# Patient Record
Sex: Male | Born: 1960 | Race: Black or African American | Hispanic: No | Marital: Single | Smoking: Current every day smoker
Health system: Southern US, Community
[De-identification: ages and names within clinical notes are randomized; demographics above are authoritative.]

---

## 2005-10-07 ENCOUNTER — Emergency Department (HOSPITAL_COMMUNITY): Admission: EM | Admit: 2005-10-07 | Discharge: 2005-10-07 | Payer: Self-pay | Admitting: Emergency Medicine

## 2005-11-22 ENCOUNTER — Emergency Department (HOSPITAL_COMMUNITY): Admission: EM | Admit: 2005-11-22 | Discharge: 2005-11-22 | Payer: Self-pay | Admitting: Emergency Medicine

## 2006-10-06 ENCOUNTER — Emergency Department (HOSPITAL_COMMUNITY): Admission: EM | Admit: 2006-10-06 | Discharge: 2006-10-06 | Payer: Self-pay | Admitting: Emergency Medicine

## 2007-01-21 ENCOUNTER — Emergency Department (HOSPITAL_COMMUNITY): Admission: EM | Admit: 2007-01-21 | Discharge: 2007-01-21 | Payer: Self-pay | Admitting: Emergency Medicine

## 2021-06-19 ENCOUNTER — Emergency Department (HOSPITAL_COMMUNITY)
Admission: EM | Admit: 2021-06-19 | Discharge: 2021-06-19 | Disposition: A | Discharge status: Home / Self Care | Payer: Managed Care, Other (non HMO) | Attending: Emergency Medicine | Admitting: Emergency Medicine

## 2021-06-19 ENCOUNTER — Emergency Department (HOSPITAL_COMMUNITY): Payer: Managed Care, Other (non HMO)

## 2021-06-19 ENCOUNTER — Encounter (HOSPITAL_COMMUNITY): Payer: Self-pay

## 2021-06-19 ENCOUNTER — Other Ambulatory Visit: Payer: Self-pay

## 2021-06-19 DIAGNOSIS — R251 Tremor, unspecified: Secondary | ICD-10-CM | POA: Diagnosis present

## 2021-06-19 DIAGNOSIS — R531 Weakness: Secondary | ICD-10-CM | POA: Insufficient documentation

## 2021-06-19 DIAGNOSIS — Z79899 Other long term (current) drug therapy: Secondary | ICD-10-CM | POA: Insufficient documentation

## 2021-06-19 DIAGNOSIS — M4802 Spinal stenosis, cervical region: Secondary | ICD-10-CM

## 2021-06-19 LAB — CBC WITH DIFFERENTIAL/PLATELET
Abs Immature Granulocytes: 0.01 10*3/uL (ref 0.00–0.07)
Basophils Absolute: 0 10*3/uL (ref 0.0–0.1)
Basophils Relative: 1 %
Eosinophils Absolute: 0.1 10*3/uL (ref 0.0–0.5)
Eosinophils Relative: 2 %
HCT: 34 % — ABNORMAL LOW (ref 39.0–52.0)
Hemoglobin: 11.8 g/dL — ABNORMAL LOW (ref 13.0–17.0)
Immature Granulocytes: 0 %
Lymphocytes Relative: 33 %
Lymphs Abs: 1.5 10*3/uL (ref 0.7–4.0)
MCH: 34.3 pg — ABNORMAL HIGH (ref 26.0–34.0)
MCHC: 34.7 g/dL (ref 30.0–36.0)
MCV: 98.8 fL (ref 80.0–100.0)
Monocytes Absolute: 0.6 10*3/uL (ref 0.1–1.0)
Monocytes Relative: 14 %
Neutro Abs: 2.3 10*3/uL (ref 1.7–7.7)
Neutrophils Relative %: 50 %
Platelets: 285 10*3/uL (ref 150–400)
RBC: 3.44 MIL/uL — ABNORMAL LOW (ref 4.22–5.81)
RDW: 13.7 % (ref 11.5–15.5)
WBC: 4.5 10*3/uL (ref 4.0–10.5)
nRBC: 0 % (ref 0.0–0.2)

## 2021-06-19 LAB — RAPID URINE DRUG SCREEN, HOSP PERFORMED
Amphetamines: NOT DETECTED
Barbiturates: NOT DETECTED
Benzodiazepines: NOT DETECTED
Cocaine: NOT DETECTED
Opiates: NOT DETECTED
Tetrahydrocannabinol: POSITIVE — AB

## 2021-06-19 LAB — COMPREHENSIVE METABOLIC PANEL
ALT: 51 U/L — ABNORMAL HIGH (ref 0–44)
AST: 87 U/L — ABNORMAL HIGH (ref 15–41)
Albumin: 4 g/dL (ref 3.5–5.0)
Alkaline Phosphatase: 82 U/L (ref 38–126)
Anion gap: 13 (ref 5–15)
BUN: 10 mg/dL (ref 6–20)
CO2: 21 mmol/L — ABNORMAL LOW (ref 22–32)
Calcium: 9.5 mg/dL (ref 8.9–10.3)
Chloride: 101 mmol/L (ref 98–111)
Creatinine, Ser: 0.71 mg/dL (ref 0.61–1.24)
GFR, Estimated: >60 mL/min (ref >60)
Glucose, Bld: 89 mg/dL (ref 70–99)
Potassium: 3.8 mmol/L (ref 3.5–5.1)
Sodium: 135 mmol/L (ref 135–145)
Total Bilirubin: 0.5 mg/dL (ref 0.3–1.2)
Total Protein: 9.3 g/dL — ABNORMAL HIGH (ref 6.5–8.1)

## 2021-06-19 LAB — ETHANOL: Alcohol, Ethyl (B): 178 mg/dL — ABNORMAL HIGH (ref <10)

## 2021-06-19 IMAGING — MR MR HEAD W/O CM
16 series · 48 of 48 positions shown · non-contrast
Comparison: Same-day noncontrast CT head

CLINICAL DATA: Bilateral upper and lower extremity weakness and
tremors, ataxia

EXAM:
MRI HEAD WITHOUT CONTRAST
TECHNIQUE: Multiplanar, multiecho pulse sequences of the brain and surrounding
structures were obtained without intravenous contrast.

[Series 9: DWI · axial · 3.0mm · 1.36mm/px · z∈[-35,+117]mm · 6 of 104 slices shown (1 of 2)]
[im 1/104]
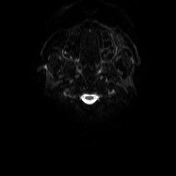
[im 21/104]
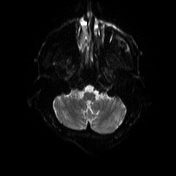
[im 42/104]
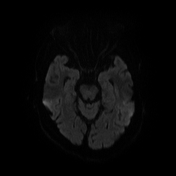
[im 62/104]
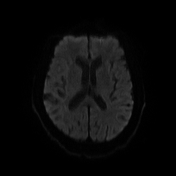
[im 83/104]
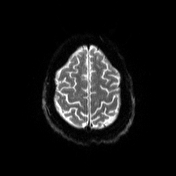
[im 104/104]
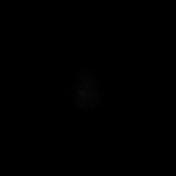

[Series 10: DWI · axial · 3.0mm · 1.36mm/px · z∈[-35,+117]mm · 3 of 52 slices shown (2 of 2)]
[im 1/52]
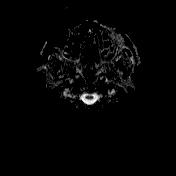
[im 26/52]
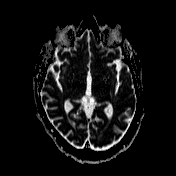
[im 52/52]
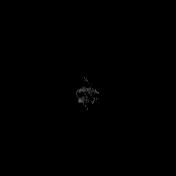

[Series 11: T1 · sagittal · 5.0mm · 0.75mm/px · 1 of 24 slices shown (1 of 4)]
[im 1/24]
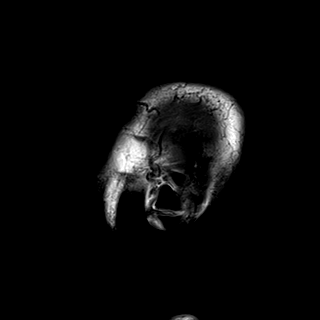

[Series 12: T2 · axial · 5.0mm · 0.62mm/px · z∈[-39,+122]mm · 2 of 25 slices shown (1 of 4)]
[im 1/25]
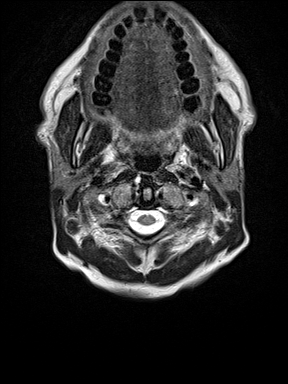
[im 25/25]
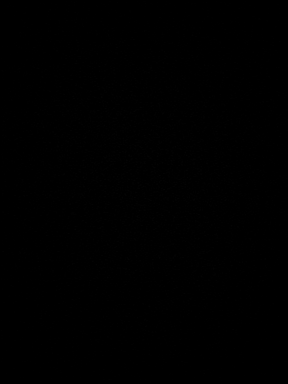

[Series 13: swi_images · axial · 3.0mm · 0.75mm/px · z∈[-41,+123]mm · 4 of 56 slices shown]
[im 1/56]
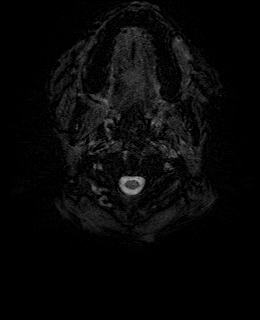
[im 19/56]
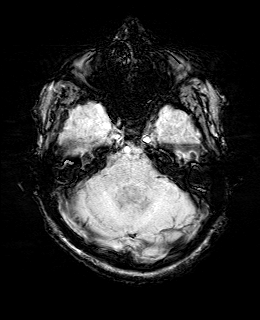
[im 37/56]
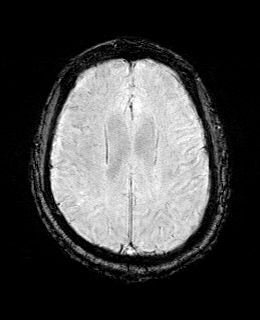
[im 56/56]
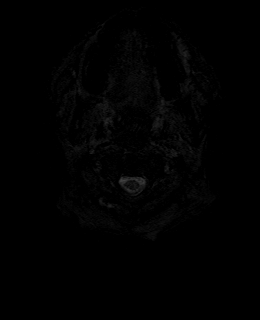

[Series 15: FLAIR · axial · 3.0mm · 0.75mm/px · z∈[-35,+117]mm · 4 of 52 slices shown]
[im 1/52]
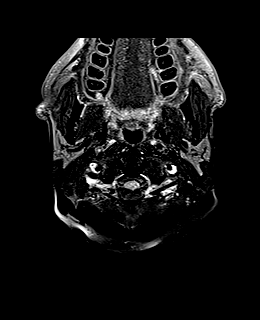
[im 18/52]
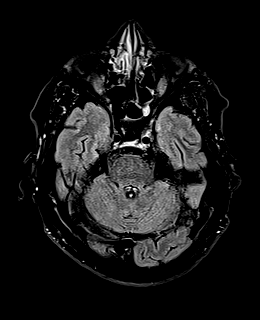
[im 35/52]
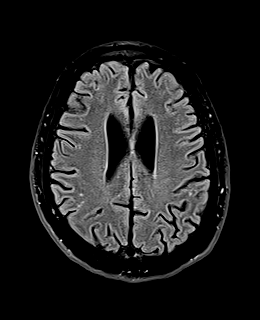
[im 52/52]
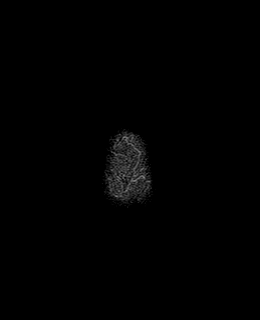

[Series 16: T1 · axial · 1.0mm · 0.94mm/px · z∈[-37,+121]mm · 11 of 160 slices shown (2 of 4)]
[im 1/160]
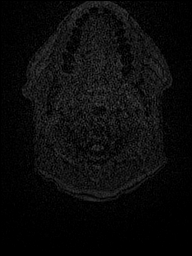
[im 16/160]
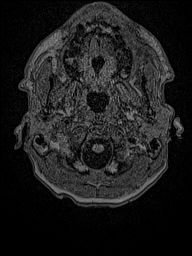
[im 32/160]
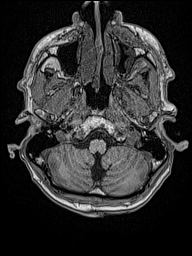
[im 48/160]
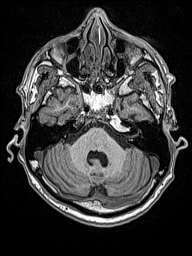
[im 64/160]
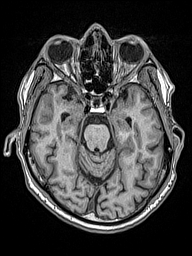
[im 80/160]
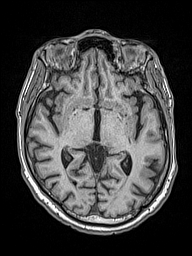
[im 96/160]
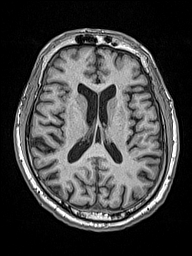
[im 112/160]
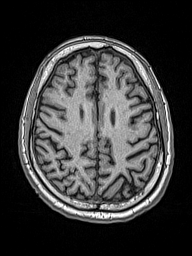
[im 128/160]
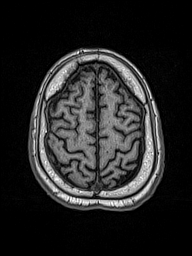
[im 144/160]
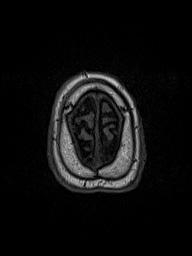
[im 160/160]
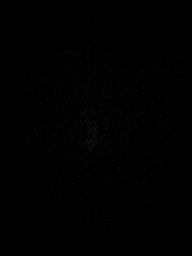

[Series 17: cor dwi_tracew · coronal · 5.0mm · 1.53mm/px · 4 of 56 slices shown]
[im 1/56]
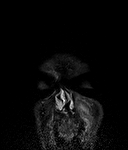
[im 19/56]
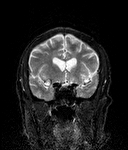
[im 37/56]
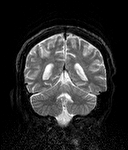
[im 56/56]
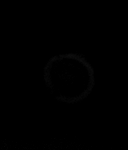

[Series 18: cor dwi_adc · coronal · 5.0mm · 1.53mm/px · 2 of 27 slices shown]
[im 1/27]
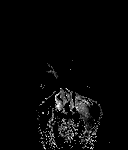
[im 27/27]
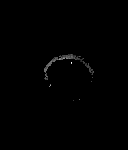

[Series 19: T2 · coronal · 5.0mm · 0.57mm/px · 2 of 32 slices shown (2 of 4)]
[im 1/32]
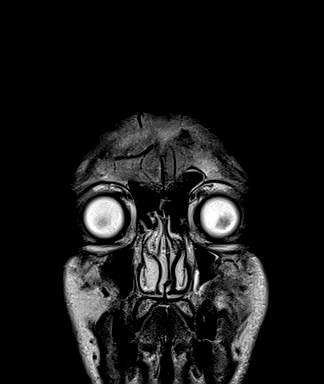
[im 32/32]
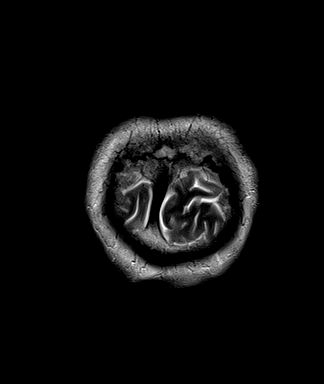

[Series 24: T1 · sagittal · 3.0mm · 0.69mm/px · 1 of 15 slices shown (3 of 4)]
[im 1/15]
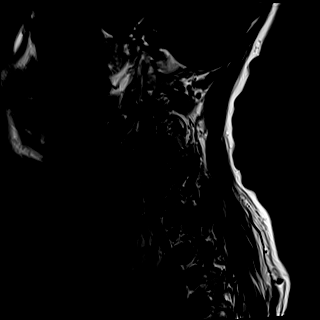

[Series 25: T2 · sagittal · 3.0mm · 0.69mm/px · 1 of 15 slices shown (3 of 4)]
[im 1/15]
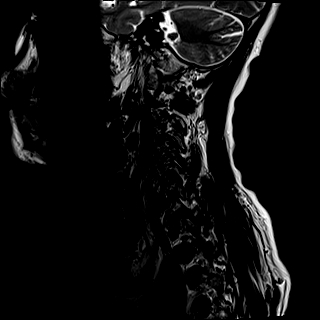

[Series 26: STIR · sagittal · 3.0mm · 0.86mm/px · 1 of 15 slices shown]
[im 1/15]
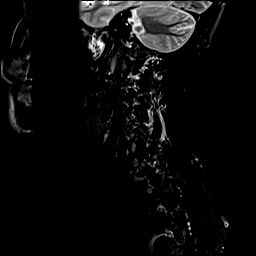

[Series 27: T2 · axial · 3.0mm · 0.70mm/px · z∈[-142,-52]mm · 2 of 27 slices shown (4 of 4)]
[im 1/27]
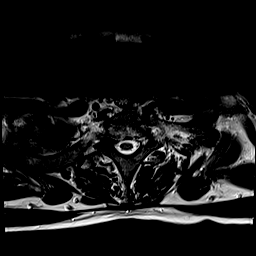
[im 27/27]
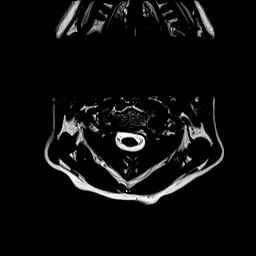

[Series 28: GRE · axial · 3.0mm · 0.35mm/px · z∈[-142,-52]mm · 2 of 27 slices shown]
[im 1/27]
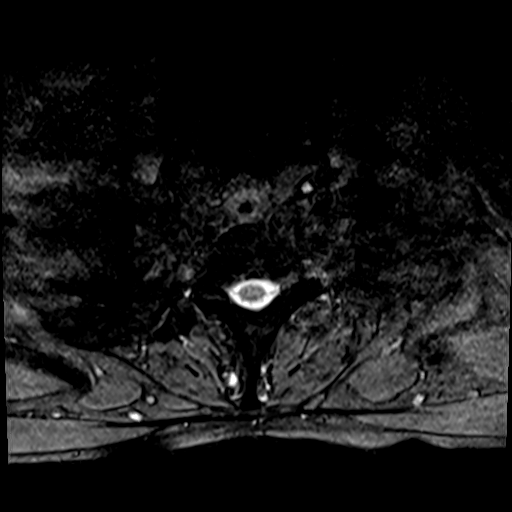
[im 27/27]
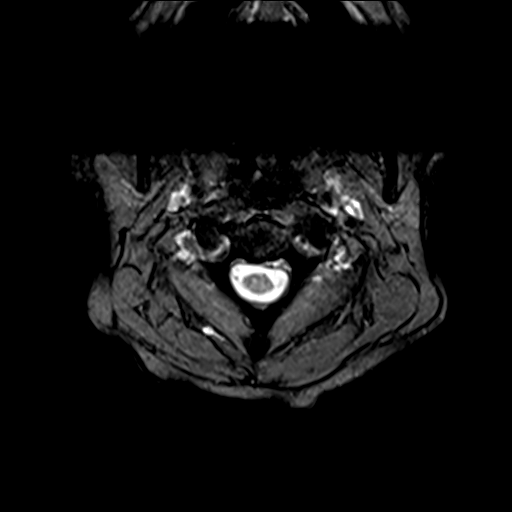

[Series 29: T1 · axial · 3.0mm · 0.35mm/px · z∈[-142,-52]mm · 2 of 26 slices shown (4 of 4)]
[im 1/26]
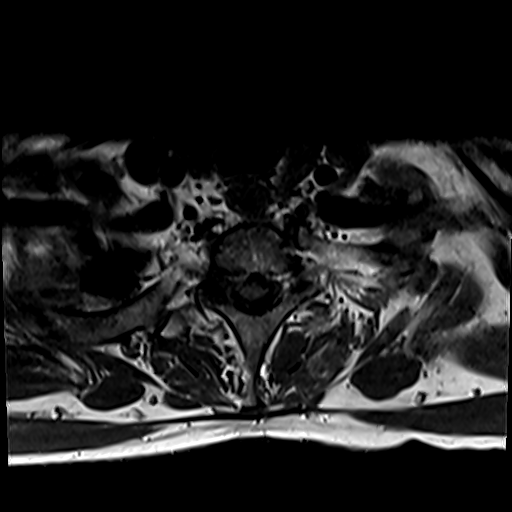
[im 26/26]
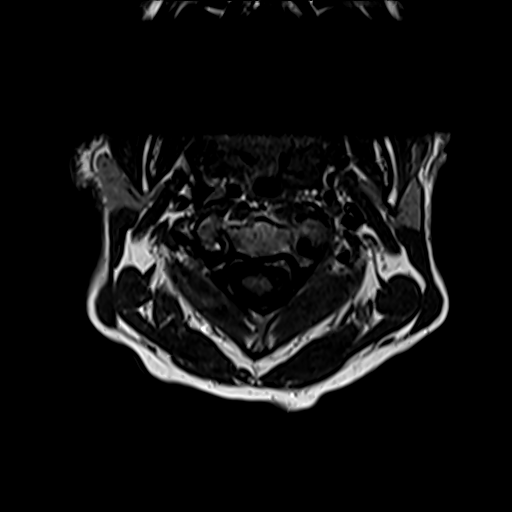

[48 of 48 positions shown; findings below may reference images not displayed]

FINDINGS: Brain: There is no evidence of acute intracranial hemorrhage,
extra-axial fluid collection, or acute infarct.

Parenchymal volume is within normal limits. The ventricles are
normal in size. Scattered small foci of FLAIR signal abnormality in
the subcortical and periventricular white matter are nonspecific but
most likely reflect minimal chronic white matter microangiopathy.

There is no suspicious parenchymal signal abnormality. There is no
mass lesion. There is no midline shift.

Vascular: Normal flow voids.

Skull and upper cervical spine: Normal marrow signal. The cervical
spine is evaluated on the separately dictated cervical spine MRI.

Sinuses/Orbits: There is mild mucosal thickening in the paranasal
sinuses. The globes and orbits are unremarkable.

Other: None.
IMPRESSION: No acute intracranial pathology or finding to explain the patient's
symptoms.

## 2021-06-19 IMAGING — MR MR CERVICAL SPINE W/O CM
16 series · 48 of 48 positions shown · non-contrast
Comparison: None.

CLINICAL DATA: Ataxia, bilateral upper and lower extremity weakness
and tremors

EXAM:
MRI CERVICAL SPINE WITHOUT CONTRAST
TECHNIQUE: Multiplanar, multisequence MR imaging of the cervical spine was
performed. No intravenous contrast was administered.

[Series 10: DWI · axial · 3.0mm · 1.36mm/px · z∈[-35,+117]mm · 6 of 104 slices shown (1 of 2)]
[im 1/104]
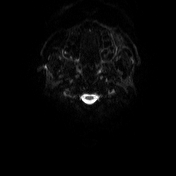
[im 21/104]
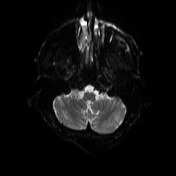
[im 42/104]
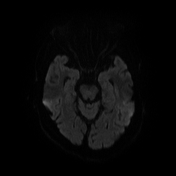
[im 62/104]
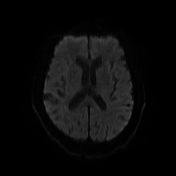
[im 83/104]
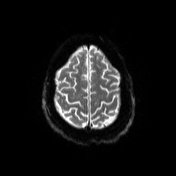
[im 104/104]
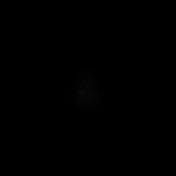

[Series 11: DWI · axial · 3.0mm · 1.36mm/px · z∈[-35,+117]mm · 3 of 52 slices shown (2 of 2)]
[im 1/52]
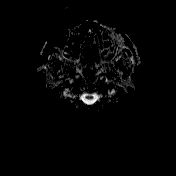
[im 26/52]
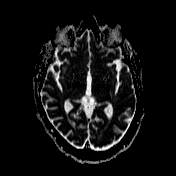
[im 52/52]
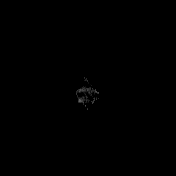

[Series 12: T1 · sagittal · 5.0mm · 0.75mm/px · 1 of 24 slices shown (1 of 4)]
[im 1/24]
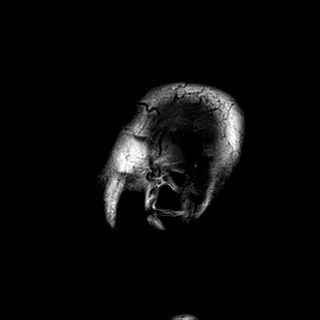

[Series 13: T2 · axial · 5.0mm · 0.62mm/px · z∈[-39,+122]mm · 2 of 25 slices shown (1 of 4)]
[im 1/25]
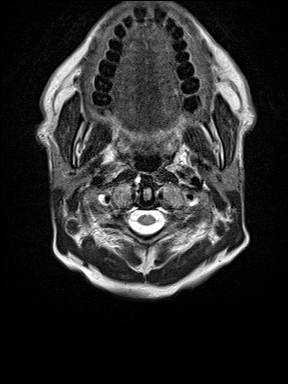
[im 25/25]
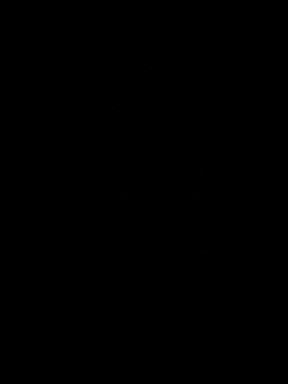

[Series 14: swi_images · axial · 3.0mm · 0.75mm/px · z∈[-41,+123]mm · 4 of 56 slices shown]
[im 1/56]
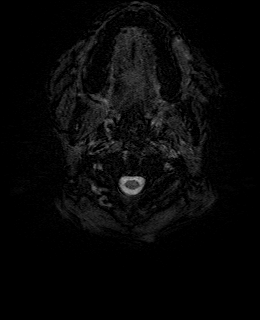
[im 19/56]
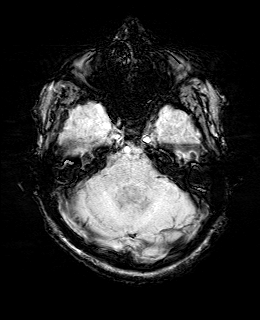
[im 37/56]
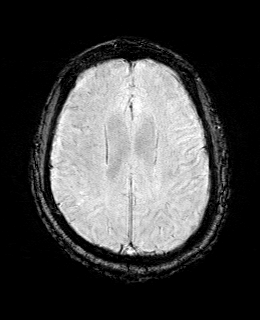
[im 56/56]
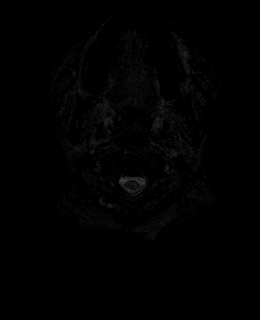

[Series 16: FLAIR · axial · 3.0mm · 0.75mm/px · z∈[-35,+117]mm · 4 of 52 slices shown]
[im 1/52]
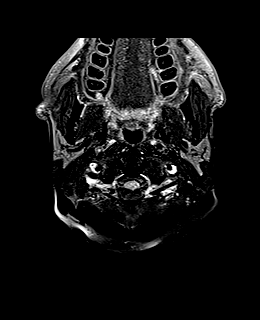
[im 18/52]
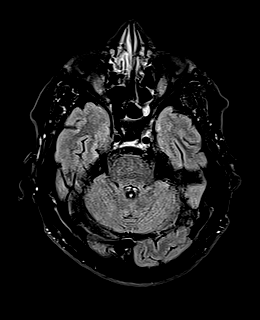
[im 35/52]
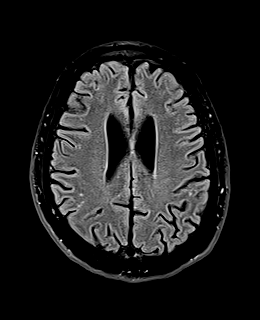
[im 52/52]
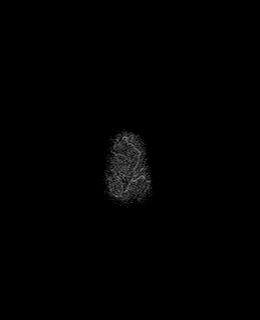

[Series 17: T1 · axial · 1.0mm · 0.94mm/px · z∈[-37,+121]mm · 11 of 160 slices shown (2 of 4)]
[im 1/160]
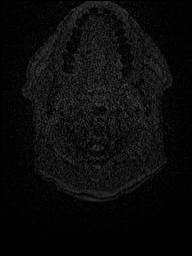
[im 16/160]
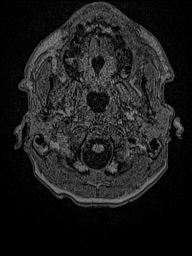
[im 32/160]
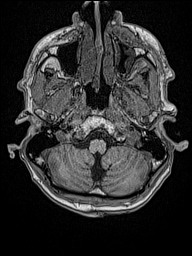
[im 48/160]
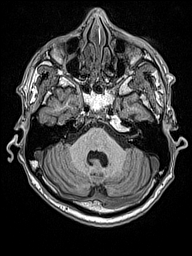
[im 64/160]
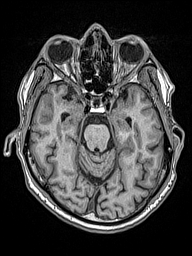
[im 80/160]
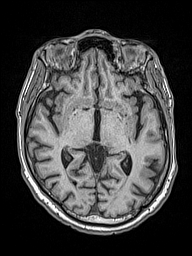
[im 96/160]
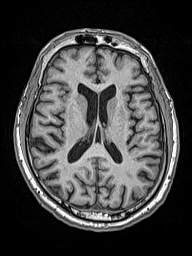
[im 112/160]
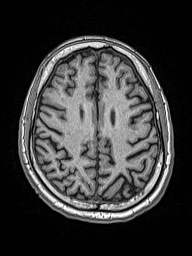
[im 128/160]
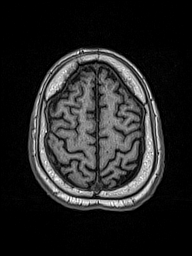
[im 144/160]
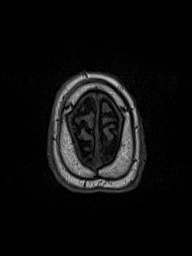
[im 160/160]
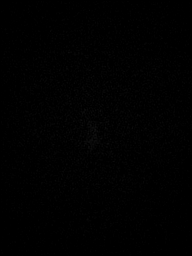

[Series 18: cor dwi_tracew · coronal · 5.0mm · 1.53mm/px · 4 of 56 slices shown]
[im 1/56]
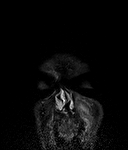
[im 19/56]
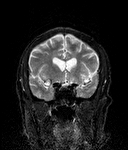
[im 37/56]
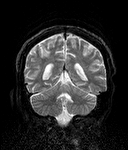
[im 56/56]
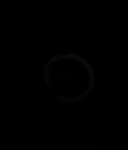

[Series 19: cor dwi_adc · coronal · 5.0mm · 1.53mm/px · 2 of 27 slices shown]
[im 1/27]
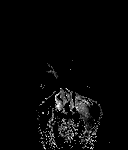
[im 27/27]
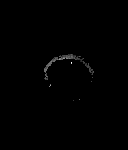

[Series 20: T2 · coronal · 5.0mm · 0.57mm/px · 2 of 32 slices shown (2 of 4)]
[im 1/32]
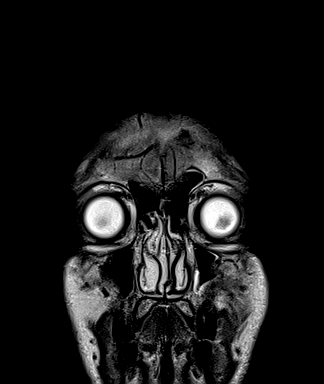
[im 32/32]
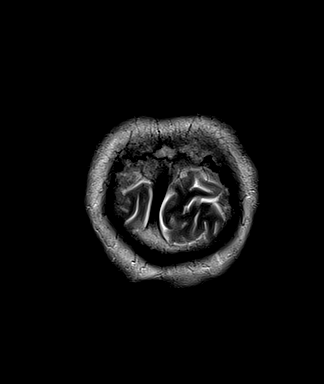

[Series 25: T1 · sagittal · 3.0mm · 0.69mm/px · 1 of 15 slices shown (3 of 4)]
[im 1/15]
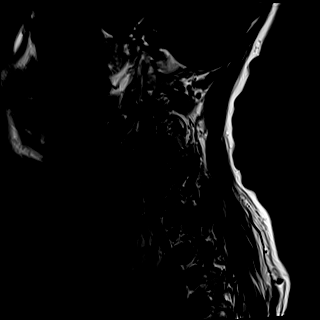

[Series 26: T2 · sagittal · 3.0mm · 0.69mm/px · 1 of 15 slices shown (3 of 4)]
[im 1/15]
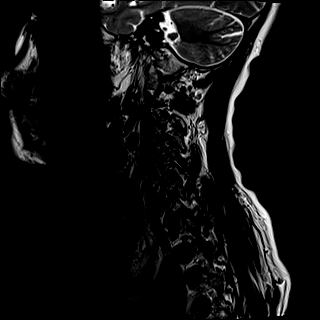

[Series 27: STIR · sagittal · 3.0mm · 0.86mm/px · 1 of 15 slices shown]
[im 1/15]
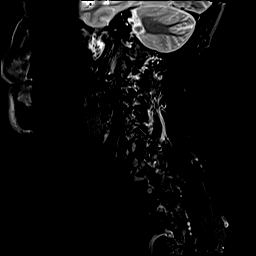

[Series 28: T2 · axial · 3.0mm · 0.70mm/px · z∈[-142,-52]mm · 2 of 27 slices shown (4 of 4)]
[im 1/27]
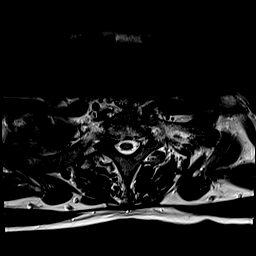
[im 27/27]
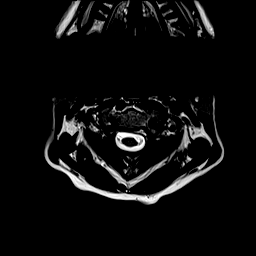

[Series 29: GRE · axial · 3.0mm · 0.35mm/px · z∈[-142,-52]mm · 2 of 27 slices shown]
[im 1/27]
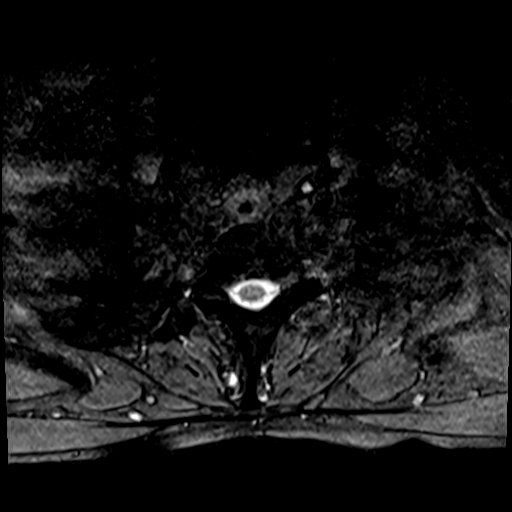
[im 27/27]
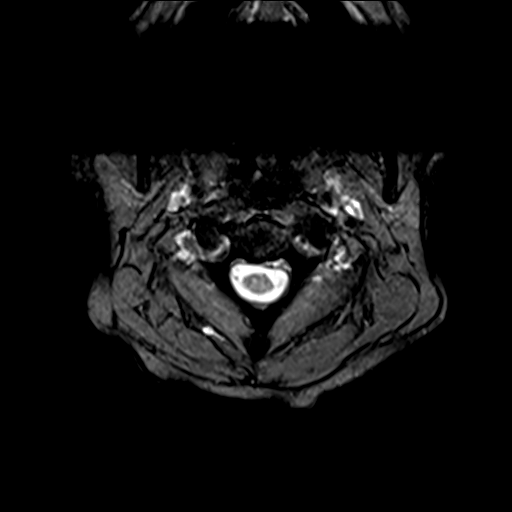

[Series 30: T1 · axial · 3.0mm · 0.35mm/px · z∈[-142,-52]mm · 2 of 26 slices shown (4 of 4)]
[im 1/26]
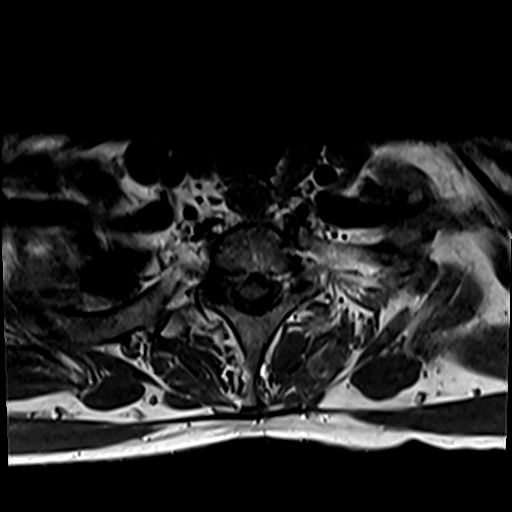
[im 26/26]
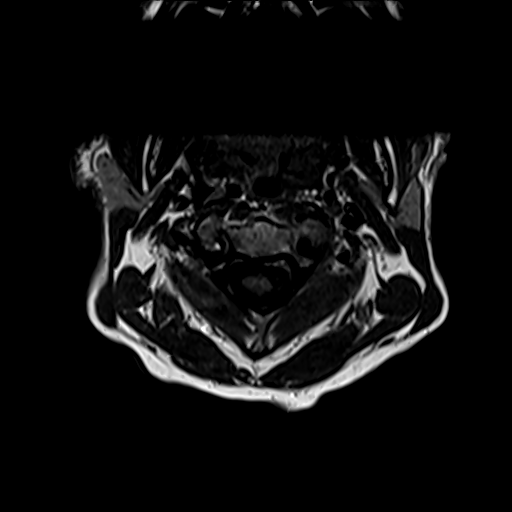

[48 of 48 positions shown; findings below may reference images not displayed]

FINDINGS: Alignment: There is grade 1 anterolisthesis of C2 on C3 and C7 on
T1, likely degenerative in nature. Alignment is otherwise normal.

Vertebrae: Vertebral body heights are preserved. Marrow signal is
heterogeneous throughout, likely degenerative in nature. There is
degenerative endplate marrow signal abnormality at T1-T2. There is
advanced facet arthropathy on the left at C2-C3 and on the right at
C3-C4. There is surrounding perifacetal inflammation.

Cord: There is longitudinal signal abnormality in the dorsal cord
extending from C3 through C5-C6. The cord is otherwise normal.

Posterior Fossa, vertebral arteries, paraspinal tissues: The
posterior fossa is assessed on the separately dictated brain MRI.
The vertebral artery flow voids are present. The paraspinal soft
tissues are unremarkable, aside from the perifacetal edema described
above.

Disc levels:

There is marked multilevel disc desiccation and narrowing throughout
the cervical spine.

C2-C3: There is a broad-based left paracentral/foraminal disc
protrusion and uncovertebral and bilateral facet arthropathy
resulting in severe left and moderate right neural foraminal
stenosis without significant spinal canal stenosis.

C3-C4: There is a broad-based posterior disc osteophyte complex,
posterior longitudinal ligament thickening, and uncovertebral and
bilateral facet arthropathy resulting in severe spinal canal
stenosis with cord compression and severe bilateral neural foraminal
stenosis.

C4-C5: There is a prominent posterior disc osteophyte complex,
posterior longitudinal ligament and ligamentum flavum thickening,
and uncovertebral and bilateral facet arthropathy resulting in
severe spinal canal stenosis with cord compression and severe
bilateral neural foraminal stenosis.

C5-C6: There is a posterior disc osteophyte complex, posterior
longitudinal ligament and ligamentum flavum thickening, and
uncovertebral and bilateral facet arthropathy resulting in moderate
to severe spinal canal stenosis with cord compression and severe
left worse than right neural foraminal stenosis.

C6-C7: There is a broad-based posterior disc osteophyte complex,
posterior longitudinal ligament and ligamentum flavum thickening,
and uncovertebral and facet arthropathy resulting in moderate spinal
canal stenosis with mild mass effect on the cord and severe
bilateral neural foraminal stenosis.

C7-T1: There is grade 1 anterolisthesis with associated degenerative
endplate change and bilateral facet arthropathy resulting in severe
right and mild-to-moderate left neural foraminal stenosis without
significant spinal canal stenosis.

T1-T2: There is a diffuse disc bulge, degenerative endplate change,
and bilateral facet arthropathy resulting in severe right and
moderate left neural foraminal stenosis without significant spinal
canal stenosis.
IMPRESSION: 1. Advanced degenerative change throughout the cervical spine
detailed above resulting in severe spinal canal stenosis at C3-C4
and C4-C5 and moderate to severe spinal canal stenosis at C5-C6 with
cord compression. There is dorsal cord signal abnormality extending
from C3 through C5-C6 most likely reflecting compressive edema
and/or myelomalacia.
2. Moderate spinal canal stenosis with mild mass effect on the cord
at C6-C7.
3. Extensive severe bilateral neural foraminal stenosis throughout
the cervical spine.
4. Facet arthropathy with perifacetal edema on the left at C2-C3 and
on the right at C3-C4. While this finding is most likely
degenerative in nature, infection with septic arthritis can not be
entirely excluded by imaging. Correlate with symptoms and lab
values.

## 2021-06-19 MED ORDER — METHYLPREDNISOLONE 4 MG PO TBPK: ORAL_TABLET | ORAL | 0 refills | Status: DC

## 2021-06-19 MED ORDER — DEXAMETHASONE SODIUM PHOSPHATE 10 MG/ML IJ SOLN
10.0000 mg | Freq: Once | INTRAMUSCULAR | Status: AC
  Administered 2021-06-19: 17:00:00 10 mg via INTRAVENOUS
  Filled 2021-06-19: qty 1

## 2021-06-19 NOTE — ED Triage Notes (Addendum)
Per EMS- Patient was picked up at work. Patient reports that he has been having  tremors daily x 1 week. EMS saw patient have tremors of his extremities that lasted approx 10 minutes. Patient reports that afterwards when these episodes occurred he has pain in his arms, legs, and neck and reports that he is unable to zip a zipper or tie his shoes.   Patient added in triage, that he can not button, pick a fork up, zip his shoes, feels like his legs are going to go out even if he has not had an episode of tremors,

## 2021-06-19 NOTE — ED Provider Notes (Signed)
[facility] DEPT Provider Note   CSN: [id number] Arrival date & time: 06/19/21  1019     History  Chief Complaint  Patient presents with   Tremors    Brian [name] is a 61 y.o. male.  Patient has a history of alcohol use.  Initially said 2 beers a day now he says 4 beers a day.  He states for the last 2 weeks he is been having a lot of shaking.  In the last 24 hours he can even tie his shoes from the shaking in his hands and he is fallen and he is unsteady walking  The history is provided by the patient and medical records.  Weakness Severity:  Moderate Onset quality:  Sudden Timing:  Constant Progression:  Worsening Chronicity:  New Context: alcohol use   Relieved by:  Nothing Worsened by:  Nothing Ineffective treatments:  None tried Associated symptoms: no abdominal pain, no chest pain, no cough, no diarrhea, no frequency, no headaches and no seizures       Home Medications Prior to Admission medications   Not on File      Allergies    Patient has no known allergies.    Review of Systems   Review of Systems  Constitutional:  Negative for appetite change and fatigue.  HENT:  Negative for congestion, ear discharge and sinus pressure.   Eyes:  Negative for discharge.  Respiratory:  Negative for cough.   Cardiovascular:  Negative for chest pain.  Gastrointestinal:  Negative for abdominal pain and diarrhea.  Genitourinary:  Negative for frequency and hematuria.  Musculoskeletal:  Negative for back pain.  Skin:  Negative for rash.  Neurological:  Positive for weakness. Negative for seizures and headaches.  Psychiatric/Behavioral:  Negative for hallucinations.    Physical Exam Updated Vital Signs BP 111/84    Pulse 84    Temp 98.2 F (36.8 C) (Oral)    Resp (!) 33    Ht 5\' 10"  (1.778 m)    Wt 59.9 kg    SpO2 97%    BMI 18.94 kg/m  Physical Exam Vitals and nursing note reviewed.  Constitutional:      Appearance: He is  well-developed.  HENT:     Head: Normocephalic.     Nose: Nose normal.  Eyes:     General: No scleral icterus.    Conjunctiva/sclera: Conjunctivae normal.  Neck:     Thyroid: No thyromegaly.  Cardiovascular:     Rate and Rhythm: Normal rate and regular rhythm.     Heart sounds: No murmur heard.   No friction rub. No gallop.  Pulmonary:     Breath sounds: No stridor. No wheezing or rales.  Chest:     Chest wall: No tenderness.  Abdominal:     General: There is no distension.     Tenderness: There is no abdominal tenderness. There is no rebound.  Musculoskeletal:     Cervical back: Neck supple.     Comments: Shaking in both arms.  Lymphadenopathy:     Cervical: No cervical adenopathy.  Skin:    Findings: No erythema or rash.  Neurological:     Mental Status: He is alert and oriented to person, place, and time.     Motor: No abnormal muscle tone.     Coordination: Coordination normal.     Comments: Patient has tremors in both arms.  He is very ataxic.  And his coordination in his arms and legs is terrible  Psychiatric:        Behavior: Behavior normal.    ED Results / Procedures / Treatments   Labs (all labs ordered are listed, but only abnormal results are displayed) Labs Reviewed  COMPREHENSIVE METABOLIC PANEL - Abnormal; Notable for the following components:      Result Value   CO2 21 (*)    Total Protein 9.3 (*)    AST 87 (*)    ALT 51 (*)    All other components within normal limits  CBC WITH DIFFERENTIAL/PLATELET - Abnormal; Notable for the following components:   RBC 3.44 (*)    Hemoglobin 11.8 (*)    HCT 34.0 (*)    MCH 34.3 (*)    All other components within normal limits  ETHANOL - Abnormal; Notable for the following components:   Alcohol, Ethyl (B) 178 (*)    All other components within normal limits  RAPID URINE DRUG SCREEN, HOSP PERFORMED - Abnormal; Notable for the following components:   Tetrahydrocannabinol POSITIVE (*)    All other components  within normal limits    EKG None  Radiology CT HEAD WO CONTRAST (5MM)  Result Date: 06/19/2021 CLINICAL DATA:  Weakness. EXAM: CT HEAD WITHOUT CONTRAST TECHNIQUE: Contiguous axial images were obtained from the base of the skull through the vertex without intravenous contrast. RADIATION DOSE REDUCTION: This exam was performed according to the departmental dose-optimization program which includes automated exposure control, adjustment of the mA and/or kV according to patient size and/or use of iterative reconstruction technique. COMPARISON:  None. FINDINGS: Brain: No evidence of acute infarction, hemorrhage, hydrocephalus, extra-axial collection or mass lesion/mass effect. Vascular: No hyperdense vessel or unexpected calcification. Skull: Normal. Negative for fracture or focal lesion. Sinuses/Orbits: No acute finding. Other: None. IMPRESSION: No acute intracranial abnormality seen. Electronically Signed   By: Marijo Conception M.D.   On: 06/19/2021 12:07    Procedures Procedures    Medications Ordered in ED Medications - No data to display  ED Course/ Medical Decision Making/ A&P Clinical Course as of 06/20/21 [id number]  Tue Jun 19, 2021  1527 Loss of coordination in hands and legs, concern for alcohol withdrawal, pending MRI, high chance of admission [MK]    Clinical Course User Index [MK] Kommor, Debe Coder, MD                           Medical Decision Making Amount and/or Complexity of Data Reviewed Labs: ordered. Radiology: ordered.  Risk Prescription drug management.   Patient with extremely poor coordination.  He is got an MRI.  His alcohol level was 178.  This is probably more related to alcohol but patient unable to ambulate without falling/   Mri pending.  Dr. Tinnie Gens to disposition pt   This patient presents to the ED for concern of weakness, this involves an extensive number of treatment options, and is a complaint that carries with it a high risk of complications and morbidity.   The differential diagnosis includes stroke EtOH abuse   Co morbidities that complicate the patient evaluation  History of alcohol abuse   Additional history obtained:  Additional history obtained from patient External records from outside source obtained and reviewed including hospital record   Lab Tests:  I Ordered, and personally interpreted labs.  The pertinent results include: Alcohol level 178, and elevated liver study   Imaging Studies ordered:  I ordered imaging studies including MRI of the head and cervical spine I independently visualized  and interpreted imaging which showed spinal stenosis I agree with the radiologist interpretation   Cardiac Monitoring:  The patient was maintained on a cardiac monitor.  I personally viewed and interpreted the cardiac monitored which showed an underlying rhythm of: Normal sinus rhythm   Medicines ordered and prescription drug management:  No medicine Reevaluation of the patient after these medicines showed that the patient improved I have reviewed the patients home medicines and have made adjustments as needed   Test Considered:  No other test   Critical Interventions:  Consult with neurosurgery   Consultations Obtained:  I requested consultation with the neurosurgery,  and discussed lab and imaging findings as well as pertinent plan - they recommend: No patient follow-up   Problem List / ED Course:  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 Tinnie Gens spoke with neurosurgery and evaluated the patient and it was decided patient will be followed back up with neurosurgery as an outpatient.           Final Clinical Impression(s) / ED Diagnoses Final diagnoses:  None    Rx / DC Orders ED Discharge Orders     None         Milton Ferguson, MD 06/20/21 [phone]

## 2021-06-19 NOTE — ED Provider Triage Note (Signed)
Emergency Medicine Provider Triage Evaluation Note  Brian [name] , a 61 y.o. male  was evaluated in triage.  Pt complains of weakness, decrease sensation and tremors.  States that the symptoms have been present over the last 2 weeks and has been gradually getting worse over this time.  Patient states that weakness is most noticeable and bilateral hands.  States that he is having trouble holding onto objects.  Patient also reports that he is having tremors to bilateral hands.  Tremors will occur randomly.  Patient also reports that he has noticed some weakness and decree sensation in his lower legs as well.  Review of Systems  Positive: Numbness, weakness, tremors Negative: Facial asymmetry, dysarthria, visual disturbance, headache  Physical Exam  BP (!) 124/92 (BP Location: Left Arm)    Pulse 98    Temp 98.2 F (36.8 C) (Oral)    Resp 18    Ht 5\' 10"  (1.778 m)    Wt 59.9 kg    SpO2 98%    BMI 18.94 kg/m  Gen:   Awake, no distress   Resp:  Normal effort  MSK:   Moves extremities without difficulty  Other:  Patient has decreased grip strength bilaterally.  Reports decree sensation throughout all digits of left hand.  No facial asymmetry or dysarthria.  Medical Decision Making  Medically screening exam initiated at 11:10 AM.  Appropriate orders placed.  CYPRESS TINNES was informed that the remainder of the evaluation will be completed by another provider, this initial triage assessment does not replace that evaluation, and the importance of remaining in the ED until their evaluation is complete.     Loni Beckwith, [place] 06/19/21 1115

## 2021-06-19 NOTE — ED Provider Notes (Signed)
Image Physical Exam  BP 130/88    Pulse 80    Temp 98.2 F (36.8 C) (Oral)    Resp 12    Ht 5\' 10"  (1.778 m)    Wt 59.9 kg    SpO2 100%    BMI 18.94 kg/m   Physical Exam Vitals and nursing note reviewed.  Constitutional:      General: He is not in acute distress.    Appearance: He is well-developed.  HENT:     Head: Normocephalic and atraumatic.  Eyes:     Conjunctiva/sclera: Conjunctivae normal.  Cardiovascular:     Rate and Rhythm: Normal rate and regular rhythm.     Heart sounds: No murmur heard. Pulmonary:     Effort: Pulmonary effort is normal. No respiratory distress.     Breath sounds: Normal breath sounds.  Abdominal:     Palpations: Abdomen is soft.     Tenderness: There is no abdominal tenderness.  Musculoskeletal:        General: No swelling.     Cervical back: Neck supple.  Skin:    General: Skin is warm and dry.     Capillary Refill: Capillary refill takes less than 2 seconds.  Neurological:     Mental Status: He is alert.     Motor: Weakness present.     Coordination: Coordination abnormal.  Psychiatric:        Mood and Affect: Mood normal.    Procedures  Procedures  ED Course / MDM   Clinical Course as of 06/19/21 2255  Tue Jun 19, 2021  1527 Loss of coordination in hands and legs, concern for alcohol withdrawal, pending MRI, high chance of admission [MK]    Clinical Course User Index [MK] Haaris Metallo, Jun 21, 2021, MD   Medical Decision Making Amount and/or Complexity of Data Reviewed Labs: ordered. Radiology: ordered.  Risk Prescription drug management.   Patient received in handoff.  Upper and lower extremity tremors and weakness with MRI head and C-spine currently pending.  MRI brain unremarkable but MRI C-spine with severe cervical stenosis from C3-C6.  I independently evaluated this study and agree with radiology read.  Neurosurgeon on-call Dr. Wyn Forster was consulted who recommended outpatient follow-up with a Medrol Dosepak.  The patient was able to  ambulate in the emergency department without difficulty and using shared decision making after long discussion with the patient he agreed that he would like to follow-up outpatient for this.  Patient given outpatient resources on how to follow-up with neurosurgery and he was discharged.       Jordan Likes, MD 06/19/21 2256

## 2021-06-19 NOTE — ED Notes (Signed)
EDP at the bedside.  ?

## 2021-06-27 ENCOUNTER — Other Ambulatory Visit: Payer: Self-pay | Admitting: Neurosurgery

## 2021-06-28 ENCOUNTER — Encounter (HOSPITAL_COMMUNITY): Payer: Self-pay | Admitting: Neurosurgery

## 2021-06-28 ENCOUNTER — Other Ambulatory Visit: Payer: Self-pay

## 2021-06-28 NOTE — Anesthesia Preprocedure Evaluation (Addendum)
Anesthesia Evaluation  Patient identified by MRN, date of birth, ID band Patient awake    Reviewed: Allergy & Precautions, NPO status , Patient's Chart, lab work & pertinent test results  Airway Mallampati: III  TM Distance: >3 FB Neck ROM: Limited    Dental  (+) Dental Advisory Given, Chipped, Poor Dentition   Pulmonary Current Smoker and Patient abstained from smoking.,    Pulmonary exam normal breath sounds clear to auscultation       Cardiovascular negative cardio ROS Normal cardiovascular exam Rhythm:Regular Rate:Normal     Neuro/Psych Cervical stenosis  negative neurological ROS     GI/Hepatic negative GI ROS, Neg liver ROS,   Endo/Other  negative endocrine ROS  Renal/GU negative Renal ROS     Musculoskeletal negative musculoskeletal ROS (+)   Abdominal   Peds  Hematology  (+) Blood dyscrasia, anemia ,   Anesthesia Other Findings   Reproductive/Obstetrics                            Anesthesia Physical Anesthesia Plan  ASA: 2  Anesthesia Plan: General   Post-op Pain Management: Tylenol PO (pre-op)   Induction: Intravenous  PONV Risk Score and Plan: 2 and Midazolam, Dexamethasone and Ondansetron  Airway Management Planned: Oral ETT and Video Laryngoscope Planned  Additional Equipment:   Intra-op Plan:   Post-operative Plan: Extubation in OR  Informed Consent: I have reviewed the patients History and Physical, chart, labs and discussed the procedure including the risks, benefits and alternatives for the proposed anesthesia with the patient or authorized representative who has indicated his/her understanding and acceptance.     Dental advisory given  Plan Discussed with: CRNA  Anesthesia Plan Comments: (2nd PIV after induction)      Anesthesia Quick Evaluation

## 2021-06-28 NOTE — Progress Notes (Signed)
PCP - denies Cardiologist - denies EKG - 06/21/21  ERAS Protcol - n/a COVID TEST- DOS  Anesthesia review: n/a   *pt works at [employer] center - his supervisor is the one taking him to all dr appts d/t pt does not have any family around, he has kids that live out of town*  -------------  SDW INSTRUCTIONS:  Your procedure is scheduled on 06/29/21 Please report to [facility] Main Entrance "A" at 0530 A.M., and check in at the Admitting office. Call this number if you have problems the morning of surgery: [phone]   Remember: Do not eat or drink after midnight the night before your surgery   Medications to take morning of surgery with a sip of water include: NONE  As of today, STOP taking any Aspirin (unless otherwise instructed by your surgeon), Aleve, Naproxen, Ibuprofen, Motrin, Advil, Goody's, BC's, all herbal medications, fish oil, and all vitamins.    The Morning of Surgery Do not wear jewelry, make-up or nail polish. Do not wear lotions, powders, colognes, or deodorant Do not bring valuables to the hospital. [facility] is not responsible for any belongings or valuables.  If you are a smoker, DO NOT Smoke 24 hours prior to surgery  If you wear a CPAP at night please bring your mask the morning of surgery   Remember that you must have someone to transport you home after your surgery, and remain with you for 24 hours if you are discharged the same day.  Please bring cases for contacts, glasses, hearing aids, dentures or bridgework because it cannot be worn into surgery.   Patients discharged the day of surgery will not be allowed to drive home.   Please shower the NIGHT BEFORE/MORNING OF SURGERY (use antibacterial soap like DIAL soap if possible). Wear comfortable clothes the morning of surgery. Oral Hygiene is also important to reduce your risk of infection.  Remember - BRUSH YOUR TEETH THE MORNING OF SURGERY WITH YOUR REGULAR TOOTHPASTE  Patient denies  shortness of breath, fever, cough and chest pain.

## 2021-06-29 ENCOUNTER — Encounter (HOSPITAL_COMMUNITY): Payer: Self-pay | Admitting: Neurosurgery

## 2021-06-29 ENCOUNTER — Inpatient Hospital Stay (HOSPITAL_COMMUNITY)
Admission: RE | Disposition: A | Discharge status: Home Health | Payer: Self-pay | Source: Home / Self Care | Attending: Neurosurgery

## 2021-06-29 ENCOUNTER — Inpatient Hospital Stay (HOSPITAL_COMMUNITY): Payer: 59

## 2021-06-29 ENCOUNTER — Inpatient Hospital Stay (HOSPITAL_COMMUNITY): Payer: 59 | Admitting: Anesthesiology

## 2021-06-29 ENCOUNTER — Inpatient Hospital Stay (HOSPITAL_COMMUNITY)
Admission: RE | Admit: 2021-06-29 | Discharge: 2021-11-02 | DRG: 471 | Disposition: A | Discharge status: Home Health | Payer: 59 | Attending: Neurosurgery | Admitting: Neurosurgery

## 2021-06-29 ENCOUNTER — Other Ambulatory Visit: Payer: Self-pay

## 2021-06-29 DIAGNOSIS — R509 Fever, unspecified: Secondary | ICD-10-CM

## 2021-06-29 DIAGNOSIS — W06XXXA Fall from bed, initial encounter: Secondary | ICD-10-CM | POA: Diagnosis not present

## 2021-06-29 DIAGNOSIS — G9341 Metabolic encephalopathy: Secondary | ICD-10-CM | POA: Diagnosis not present

## 2021-06-29 DIAGNOSIS — F1010 Alcohol abuse, uncomplicated: Secondary | ICD-10-CM | POA: Diagnosis present

## 2021-06-29 DIAGNOSIS — R008 Other abnormalities of heart beat: Secondary | ICD-10-CM | POA: Diagnosis not present

## 2021-06-29 DIAGNOSIS — R570 Cardiogenic shock: Secondary | ICD-10-CM | POA: Diagnosis not present

## 2021-06-29 DIAGNOSIS — R569 Unspecified convulsions: Secondary | ICD-10-CM | POA: Diagnosis not present

## 2021-06-29 DIAGNOSIS — F1911 Other psychoactive substance abuse, in remission: Secondary | ICD-10-CM | POA: Diagnosis present

## 2021-06-29 DIAGNOSIS — E43 Unspecified severe protein-calorie malnutrition: Secondary | ICD-10-CM | POA: Insufficient documentation

## 2021-06-29 DIAGNOSIS — Z602 Problems related to living alone: Secondary | ICD-10-CM | POA: Diagnosis present

## 2021-06-29 DIAGNOSIS — W07XXXA Fall from chair, initial encounter: Secondary | ICD-10-CM | POA: Diagnosis not present

## 2021-06-29 DIAGNOSIS — Z431 Encounter for attention to gastrostomy: Secondary | ICD-10-CM

## 2021-06-29 DIAGNOSIS — M4712 Other spondylosis with myelopathy, cervical region: Principal | ICD-10-CM | POA: Diagnosis present

## 2021-06-29 DIAGNOSIS — R079 Chest pain, unspecified: Secondary | ICD-10-CM

## 2021-06-29 DIAGNOSIS — G9751 Postprocedural hemorrhage and hematoma of a nervous system organ or structure following a nervous system procedure: Secondary | ICD-10-CM | POA: Diagnosis not present

## 2021-06-29 DIAGNOSIS — R6520 Severe sepsis without septic shock: Secondary | ICD-10-CM | POA: Diagnosis not present

## 2021-06-29 DIAGNOSIS — D75839 Thrombocytosis, unspecified: Secondary | ICD-10-CM

## 2021-06-29 DIAGNOSIS — Z79899 Other long term (current) drug therapy: Secondary | ICD-10-CM

## 2021-06-29 DIAGNOSIS — F10131 Alcohol abuse with withdrawal delirium: Secondary | ICD-10-CM | POA: Diagnosis not present

## 2021-06-29 DIAGNOSIS — R5381 Other malaise: Secondary | ICD-10-CM | POA: Diagnosis present

## 2021-06-29 DIAGNOSIS — M79641 Pain in right hand: Secondary | ICD-10-CM | POA: Diagnosis not present

## 2021-06-29 DIAGNOSIS — R7989 Other specified abnormal findings of blood chemistry: Secondary | ICD-10-CM

## 2021-06-29 DIAGNOSIS — M545 Low back pain, unspecified: Secondary | ICD-10-CM | POA: Diagnosis not present

## 2021-06-29 DIAGNOSIS — E876 Hypokalemia: Secondary | ICD-10-CM | POA: Diagnosis not present

## 2021-06-29 DIAGNOSIS — J690 Pneumonitis due to inhalation of food and vomit: Secondary | ICD-10-CM | POA: Diagnosis present

## 2021-06-29 DIAGNOSIS — F17210 Nicotine dependence, cigarettes, uncomplicated: Secondary | ICD-10-CM | POA: Diagnosis present

## 2021-06-29 DIAGNOSIS — Y92230 Patient room in hospital as the place of occurrence of the external cause: Secondary | ICD-10-CM | POA: Diagnosis not present

## 2021-06-29 DIAGNOSIS — Y838 Other surgical procedures as the cause of abnormal reaction of the patient, or of later complication, without mention of misadventure at the time of the procedure: Secondary | ICD-10-CM | POA: Diagnosis not present

## 2021-06-29 DIAGNOSIS — A498 Other bacterial infections of unspecified site: Secondary | ICD-10-CM | POA: Diagnosis not present

## 2021-06-29 DIAGNOSIS — M25532 Pain in left wrist: Secondary | ICD-10-CM | POA: Diagnosis not present

## 2021-06-29 DIAGNOSIS — I2699 Other pulmonary embolism without acute cor pulmonale: Secondary | ICD-10-CM | POA: Diagnosis not present

## 2021-06-29 DIAGNOSIS — E871 Hypo-osmolality and hyponatremia: Secondary | ICD-10-CM | POA: Diagnosis not present

## 2021-06-29 DIAGNOSIS — I621 Nontraumatic extradural hemorrhage: Secondary | ICD-10-CM | POA: Diagnosis not present

## 2021-06-29 DIAGNOSIS — R059 Cough, unspecified: Secondary | ICD-10-CM | POA: Diagnosis not present

## 2021-06-29 DIAGNOSIS — Z419 Encounter for procedure for purposes other than remedying health state, unspecified: Secondary | ICD-10-CM

## 2021-06-29 DIAGNOSIS — R339 Retention of urine, unspecified: Secondary | ICD-10-CM | POA: Diagnosis not present

## 2021-06-29 DIAGNOSIS — F419 Anxiety disorder, unspecified: Secondary | ICD-10-CM | POA: Diagnosis not present

## 2021-06-29 DIAGNOSIS — T380X5A Adverse effect of glucocorticoids and synthetic analogues, initial encounter: Secondary | ICD-10-CM | POA: Diagnosis not present

## 2021-06-29 DIAGNOSIS — R000 Tachycardia, unspecified: Secondary | ICD-10-CM | POA: Diagnosis present

## 2021-06-29 DIAGNOSIS — J449 Chronic obstructive pulmonary disease, unspecified: Secondary | ICD-10-CM | POA: Diagnosis present

## 2021-06-29 DIAGNOSIS — I2694 Multiple subsegmental pulmonary emboli without acute cor pulmonale: Secondary | ICD-10-CM | POA: Diagnosis not present

## 2021-06-29 DIAGNOSIS — A419 Sepsis, unspecified organism: Secondary | ICD-10-CM | POA: Diagnosis not present

## 2021-06-29 DIAGNOSIS — B9620 Unspecified Escherichia coli [E. coli] as the cause of diseases classified elsewhere: Secondary | ICD-10-CM | POA: Diagnosis not present

## 2021-06-29 DIAGNOSIS — R471 Dysarthria and anarthria: Secondary | ICD-10-CM | POA: Diagnosis not present

## 2021-06-29 DIAGNOSIS — J9811 Atelectasis: Secondary | ICD-10-CM | POA: Diagnosis not present

## 2021-06-29 DIAGNOSIS — R0902 Hypoxemia: Secondary | ICD-10-CM

## 2021-06-29 DIAGNOSIS — G9529 Other cord compression: Secondary | ICD-10-CM | POA: Diagnosis present

## 2021-06-29 DIAGNOSIS — I493 Ventricular premature depolarization: Secondary | ICD-10-CM | POA: Diagnosis not present

## 2021-06-29 DIAGNOSIS — W19XXXA Unspecified fall, initial encounter: Secondary | ICD-10-CM

## 2021-06-29 DIAGNOSIS — R778 Other specified abnormalities of plasma proteins: Secondary | ICD-10-CM

## 2021-06-29 DIAGNOSIS — R29718 NIHSS score 18: Secondary | ICD-10-CM | POA: Diagnosis not present

## 2021-06-29 DIAGNOSIS — G959 Disease of spinal cord, unspecified: Secondary | ICD-10-CM | POA: Diagnosis present

## 2021-06-29 DIAGNOSIS — T83511A Infection and inflammatory reaction due to indwelling urethral catheter, initial encounter: Secondary | ICD-10-CM | POA: Diagnosis not present

## 2021-06-29 DIAGNOSIS — R5082 Postprocedural fever: Secondary | ICD-10-CM | POA: Diagnosis not present

## 2021-06-29 DIAGNOSIS — R6521 Severe sepsis with septic shock: Secondary | ICD-10-CM | POA: Diagnosis not present

## 2021-06-29 DIAGNOSIS — F17200 Nicotine dependence, unspecified, uncomplicated: Secondary | ICD-10-CM | POA: Diagnosis present

## 2021-06-29 DIAGNOSIS — J9690 Respiratory failure, unspecified, unspecified whether with hypoxia or hypercapnia: Secondary | ICD-10-CM

## 2021-06-29 DIAGNOSIS — E781 Pure hyperglyceridemia: Secondary | ICD-10-CM | POA: Diagnosis not present

## 2021-06-29 DIAGNOSIS — R296 Repeated falls: Secondary | ICD-10-CM | POA: Diagnosis present

## 2021-06-29 DIAGNOSIS — Z681 Body mass index (BMI) 19 or less, adult: Secondary | ICD-10-CM

## 2021-06-29 DIAGNOSIS — Z01818 Encounter for other preprocedural examination: Secondary | ICD-10-CM

## 2021-06-29 DIAGNOSIS — Z781 Physical restraint status: Secondary | ICD-10-CM

## 2021-06-29 DIAGNOSIS — R739 Hyperglycemia, unspecified: Secondary | ICD-10-CM

## 2021-06-29 DIAGNOSIS — R64 Cachexia: Secondary | ICD-10-CM | POA: Diagnosis not present

## 2021-06-29 DIAGNOSIS — E440 Moderate protein-calorie malnutrition: Secondary | ICD-10-CM | POA: Insufficient documentation

## 2021-06-29 DIAGNOSIS — R54 Age-related physical debility: Secondary | ICD-10-CM | POA: Diagnosis not present

## 2021-06-29 DIAGNOSIS — R1310 Dysphagia, unspecified: Secondary | ICD-10-CM | POA: Diagnosis not present

## 2021-06-29 DIAGNOSIS — G9589 Other specified diseases of spinal cord: Secondary | ICD-10-CM | POA: Diagnosis present

## 2021-06-29 DIAGNOSIS — S14159A Other incomplete lesion at unspecified level of cervical spinal cord, initial encounter: Secondary | ICD-10-CM | POA: Diagnosis present

## 2021-06-29 DIAGNOSIS — A4151 Sepsis due to Escherichia coli [E. coli]: Secondary | ICD-10-CM | POA: Diagnosis not present

## 2021-06-29 DIAGNOSIS — K5900 Constipation, unspecified: Secondary | ICD-10-CM | POA: Diagnosis not present

## 2021-06-29 DIAGNOSIS — R748 Abnormal levels of other serum enzymes: Secondary | ICD-10-CM | POA: Diagnosis present

## 2021-06-29 DIAGNOSIS — Z20822 Contact with and (suspected) exposure to covid-19: Secondary | ICD-10-CM | POA: Diagnosis present

## 2021-06-29 DIAGNOSIS — R140 Abdominal distension (gaseous): Secondary | ICD-10-CM | POA: Diagnosis not present

## 2021-06-29 DIAGNOSIS — B9689 Other specified bacterial agents as the cause of diseases classified elsewhere: Secondary | ICD-10-CM | POA: Diagnosis not present

## 2021-06-29 DIAGNOSIS — D638 Anemia in other chronic diseases classified elsewhere: Secondary | ICD-10-CM | POA: Diagnosis not present

## 2021-06-29 DIAGNOSIS — L270 Generalized skin eruption due to drugs and medicaments taken internally: Secondary | ICD-10-CM | POA: Diagnosis not present

## 2021-06-29 DIAGNOSIS — I6789 Other cerebrovascular disease: Secondary | ICD-10-CM | POA: Diagnosis not present

## 2021-06-29 DIAGNOSIS — J9601 Acute respiratory failure with hypoxia: Secondary | ICD-10-CM | POA: Diagnosis not present

## 2021-06-29 DIAGNOSIS — M4802 Spinal stenosis, cervical region: Secondary | ICD-10-CM | POA: Diagnosis present

## 2021-06-29 DIAGNOSIS — N390 Urinary tract infection, site not specified: Secondary | ICD-10-CM | POA: Diagnosis not present

## 2021-06-29 DIAGNOSIS — T17908A Unspecified foreign body in respiratory tract, part unspecified causing other injury, initial encounter: Secondary | ICD-10-CM

## 2021-06-29 DIAGNOSIS — D649 Anemia, unspecified: Secondary | ICD-10-CM

## 2021-06-29 DIAGNOSIS — M62838 Other muscle spasm: Secondary | ICD-10-CM | POA: Diagnosis not present

## 2021-06-29 DIAGNOSIS — M48061 Spinal stenosis, lumbar region without neurogenic claudication: Secondary | ICD-10-CM | POA: Diagnosis present

## 2021-06-29 DIAGNOSIS — Z9911 Dependence on respirator [ventilator] status: Secondary | ICD-10-CM

## 2021-06-29 DIAGNOSIS — E861 Hypovolemia: Secondary | ICD-10-CM | POA: Diagnosis not present

## 2021-06-29 DIAGNOSIS — D72825 Bandemia: Secondary | ICD-10-CM

## 2021-06-29 DIAGNOSIS — Z751 Person awaiting admission to adequate facility elsewhere: Secondary | ICD-10-CM

## 2021-06-29 DIAGNOSIS — R7881 Bacteremia: Secondary | ICD-10-CM | POA: Diagnosis not present

## 2021-06-29 DIAGNOSIS — E8809 Other disorders of plasma-protein metabolism, not elsewhere classified: Secondary | ICD-10-CM | POA: Diagnosis not present

## 2021-06-29 DIAGNOSIS — Z7901 Long term (current) use of anticoagulants: Secondary | ICD-10-CM

## 2021-06-29 DIAGNOSIS — I5032 Chronic diastolic (congestive) heart failure: Secondary | ICD-10-CM | POA: Diagnosis present

## 2021-06-29 DIAGNOSIS — E162 Hypoglycemia, unspecified: Secondary | ICD-10-CM | POA: Diagnosis not present

## 2021-06-29 DIAGNOSIS — N19 Unspecified kidney failure: Secondary | ICD-10-CM

## 2021-06-29 DIAGNOSIS — N400 Enlarged prostate without lower urinary tract symptoms: Secondary | ICD-10-CM | POA: Diagnosis not present

## 2021-06-29 DIAGNOSIS — R112 Nausea with vomiting, unspecified: Secondary | ICD-10-CM | POA: Diagnosis not present

## 2021-06-29 DIAGNOSIS — R490 Dysphonia: Secondary | ICD-10-CM | POA: Diagnosis not present

## 2021-06-29 DIAGNOSIS — R1110 Vomiting, unspecified: Secondary | ICD-10-CM

## 2021-06-29 DIAGNOSIS — E860 Dehydration: Secondary | ICD-10-CM | POA: Diagnosis not present

## 2021-06-29 DIAGNOSIS — Z4659 Encounter for fitting and adjustment of other gastrointestinal appliance and device: Secondary | ICD-10-CM

## 2021-06-29 HISTORY — PX: ANTERIOR CERVICAL DECOMPRESSION/DISCECTOMY FUSION 4 LEVELS: SHX5556

## 2021-06-29 LAB — TYPE AND SCREEN
ABO/RH(D): O POS
Antibody Screen: NEGATIVE

## 2021-06-29 LAB — SURGICAL PCR SCREEN
MRSA, PCR: NEGATIVE
Staphylococcus aureus: NEGATIVE

## 2021-06-29 LAB — SARS CORONAVIRUS 2 BY RT PCR (HOSPITAL ORDER, PERFORMED IN ~~LOC~~ HOSPITAL LAB): SARS Coronavirus 2: NEGATIVE

## 2021-06-29 LAB — ABO/RH: ABO/RH(D): O POS

## 2021-06-29 IMAGING — RF DG CERVICAL SPINE 1V
1 series · 1 of 1 positions shown · non-contrast
Comparison: Cervical spine MRI [DATE].

CLINICAL DATA: Surgery: C3-7 ACDF.

EXAM:
DG CERVICAL SPINE - 1 VIEW

[Series 1: run · 1 of 1 slices shown]
[im 1/1]
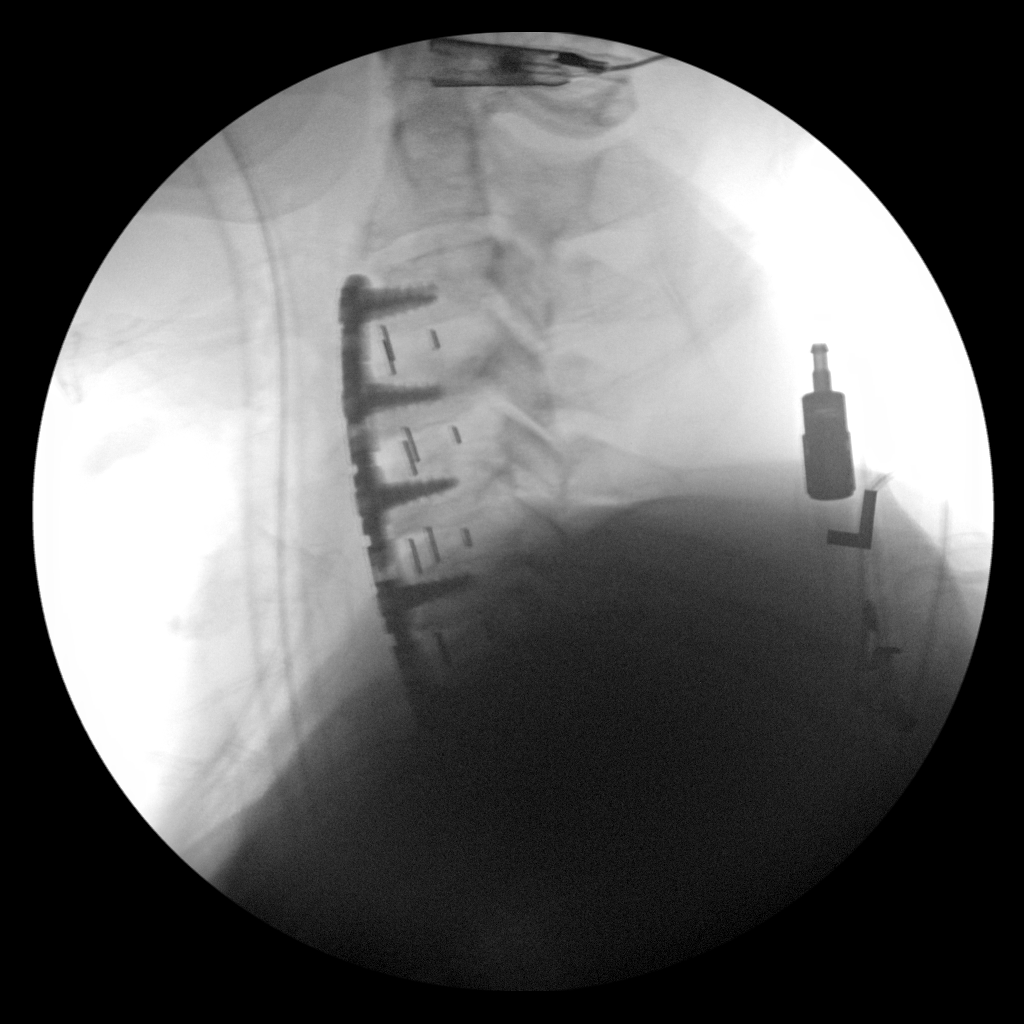

[1 of 1 positions shown; findings below may reference images not displayed]

FINDINGS: C-arm fluoroscopy provided in the operating [HOSPITAL] seconds of
fluoroscopy time. 7.77 mGy.

A single spot fluoroscopic image of the cervical spine is submitted
from the operating room. This demonstrates evidence of interval
anterior cervical discectomy from C3 through at least C7. The
inferior extent of the surgical hardware is not visualized on this
single spot image. No complications are identified.
IMPRESSION: Single intraoperative spot lateral view of the cervical spine
following cervical fusion.

## 2021-06-29 SURGERY — ANTERIOR CERVICAL DECOMPRESSION/DISCECTOMY FUSION 4 LEVELS
Anesthesia: General

## 2021-06-29 MED ORDER — CHLORHEXIDINE GLUCONATE CLOTH 2 % EX PADS: 6.0000 | MEDICATED_PAD | Freq: Once | CUTANEOUS | Status: DC

## 2021-06-29 MED ORDER — HYDROMORPHONE HCL 1 MG/ML IJ SOLN
INTRAMUSCULAR | Status: DC | PRN
  Administered 2021-06-29 (×2): .5 mg via INTRAVENOUS

## 2021-06-29 MED ORDER — MIDAZOLAM HCL 2 MG/2ML IJ SOLN
INTRAMUSCULAR | Status: DC | PRN
  Administered 2021-06-29 (×2): 1 mg via INTRAVENOUS

## 2021-06-29 MED ORDER — THROMBIN 5000 UNITS EX SOLR
CUTANEOUS | Status: AC
  Filled 2021-06-29: qty 5000

## 2021-06-29 MED ORDER — SODIUM CHLORIDE 0.9% FLUSH: 3.0000 mL | INTRAVENOUS | Status: DC | PRN

## 2021-06-29 MED ORDER — SODIUM CHLORIDE 0.9% FLUSH
3.0000 mL | Freq: Two times a day (BID) | INTRAVENOUS | Status: DC
  Administered 2021-06-29 – 2021-07-22 (×39): 3 mL via INTRAVENOUS

## 2021-06-29 MED ORDER — PHENYLEPHRINE 40 MCG/ML (10ML) SYRINGE FOR IV PUSH (FOR BLOOD PRESSURE SUPPORT)
PREFILLED_SYRINGE | INTRAVENOUS | Status: AC
  Filled 2021-06-29: qty 10

## 2021-06-29 MED ORDER — FENTANYL CITRATE (PF) 250 MCG/5ML IJ SOLN
INTRAMUSCULAR | Status: AC
  Filled 2021-06-29: qty 5

## 2021-06-29 MED ORDER — ONDANSETRON HCL 4 MG/2ML IJ SOLN
4.0000 mg | Freq: Four times a day (QID) | INTRAMUSCULAR | Status: DC | PRN
  Administered 2021-06-30 – 2021-07-24 (×3): 4 mg via INTRAVENOUS
  Filled 2021-06-29 (×3): qty 2

## 2021-06-29 MED ORDER — PROPOFOL 10 MG/ML IV BOLUS
INTRAVENOUS | Status: AC
  Filled 2021-06-29: qty 20

## 2021-06-29 MED ORDER — ACETAMINOPHEN 325 MG PO TABS
650.0000 mg | ORAL_TABLET | ORAL | Status: DC | PRN
  Administered 2021-06-30 – 2021-07-14 (×4): 650 mg via ORAL
  Filled 2021-06-29 (×5): qty 2

## 2021-06-29 MED ORDER — THROMBIN 20000 UNITS EX SOLR
CUTANEOUS | Status: DC | PRN
  Administered 2021-06-29: 20 mL via TOPICAL

## 2021-06-29 MED ORDER — HYDROMORPHONE HCL 1 MG/ML IJ SOLN
INTRAMUSCULAR | Status: AC
  Filled 2021-06-29: qty 0.5

## 2021-06-29 MED ORDER — FENTANYL CITRATE (PF) 250 MCG/5ML IJ SOLN
INTRAMUSCULAR | Status: DC | PRN
  Administered 2021-06-29: 50 ug via INTRAVENOUS
  Administered 2021-06-29: 25 ug via INTRAVENOUS
  Administered 2021-06-29 (×2): 50 ug via INTRAVENOUS
  Administered 2021-06-29: 25 ug via INTRAVENOUS
  Administered 2021-06-29: 50 ug via INTRAVENOUS

## 2021-06-29 MED ORDER — CEFAZOLIN SODIUM-DEXTROSE 2-4 GM/100ML-% IV SOLN
2.0000 g | INTRAVENOUS | Status: AC
  Administered 2021-06-29: 2 g via INTRAVENOUS

## 2021-06-29 MED ORDER — ESMOLOL HCL 100 MG/10ML IV SOLN
INTRAVENOUS | Status: DC | PRN
  Administered 2021-06-29 (×2): 20 mg via INTRAVENOUS
  Administered 2021-06-29 (×2): 30 mg via INTRAVENOUS

## 2021-06-29 MED ORDER — LIDOCAINE 2% (20 MG/ML) 5 ML SYRINGE
INTRAMUSCULAR | Status: DC | PRN
Start: 2021-06-29 — End: 2021-06-29
  Administered 2021-06-29: 60 mg via INTRAVENOUS

## 2021-06-29 MED ORDER — SODIUM CHLORIDE 0.9 % IV SOLN
250.0000 mL | INTRAVENOUS | Status: DC
  Administered 2021-06-29: 250 mL via INTRAVENOUS

## 2021-06-29 MED ORDER — LACTATED RINGERS IV SOLN: INTRAVENOUS | Status: DC

## 2021-06-29 MED ORDER — SUGAMMADEX SODIUM 200 MG/2ML IV SOLN
INTRAVENOUS | Status: DC | PRN
Start: 2021-06-29 — End: 2021-06-29
  Administered 2021-06-29: 200 mg via INTRAVENOUS

## 2021-06-29 MED ORDER — ORAL CARE MOUTH RINSE: 15.0000 mL | Freq: Once | OROMUCOSAL | Status: AC

## 2021-06-29 MED ORDER — HYDROCODONE-ACETAMINOPHEN 5-325 MG PO TABS
1.0000 | ORAL_TABLET | ORAL | Status: DC | PRN
  Administered 2021-06-29 – 2021-07-01 (×3): 1 via ORAL
  Filled 2021-06-29 (×6): qty 1

## 2021-06-29 MED ORDER — LABETALOL HCL 5 MG/ML IV SOLN
INTRAVENOUS | Status: DC | PRN
  Administered 2021-06-29 (×2): 5 mg via INTRAVENOUS

## 2021-06-29 MED ORDER — HYDROCODONE-ACETAMINOPHEN 10-325 MG PO TABS
1.0000 | ORAL_TABLET | ORAL | Status: DC | PRN
  Administered 2021-06-29 – 2021-06-30 (×3): 1 via ORAL
  Administered 2021-06-30 – 2021-07-11 (×10): 2 via ORAL
  Administered 2021-07-12 (×2): 1 via ORAL
  Administered 2021-07-12: 2 via ORAL
  Administered 2021-07-13: 1 via ORAL
  Administered 2021-07-13 – 2021-07-15 (×4): 2 via ORAL
  Filled 2021-06-29 (×4): qty 2
  Filled 2021-06-29: qty 1
  Filled 2021-06-29 (×2): qty 2
  Filled 2021-06-29 (×3): qty 1
  Filled 2021-06-29 (×2): qty 2
  Filled 2021-06-29: qty 1
  Filled 2021-06-29 (×5): qty 2
  Filled 2021-06-29: qty 1
  Filled 2021-06-29 (×2): qty 2

## 2021-06-29 MED ORDER — ROCURONIUM BROMIDE 10 MG/ML (PF) SYRINGE
PREFILLED_SYRINGE | INTRAVENOUS | Status: DC | PRN
  Administered 2021-06-29 (×3): 50 mg via INTRAVENOUS

## 2021-06-29 MED ORDER — THROMBIN 5000 UNITS EX SOLR
OROMUCOSAL | Status: DC | PRN
  Administered 2021-06-29 (×2): 5 mL via TOPICAL

## 2021-06-29 MED ORDER — ALBUMIN HUMAN 5 % IV SOLN
INTRAVENOUS | Status: DC | PRN
Start: 2021-06-29 — End: 2021-06-29

## 2021-06-29 MED ORDER — DEXAMETHASONE SODIUM PHOSPHATE 10 MG/ML IJ SOLN
INTRAMUSCULAR | Status: DC | PRN
  Administered 2021-06-29: 10 mg via INTRAVENOUS

## 2021-06-29 MED ORDER — 0.9 % SODIUM CHLORIDE (POUR BTL) OPTIME
TOPICAL | Status: DC | PRN
  Administered 2021-06-29: 1000 mL

## 2021-06-29 MED ORDER — ONDANSETRON HCL 4 MG PO TABS: 4.0000 mg | ORAL_TABLET | Freq: Four times a day (QID) | ORAL | Status: DC | PRN

## 2021-06-29 MED ORDER — PROMETHAZINE HCL 25 MG/ML IJ SOLN: 6.2500 mg | INTRAMUSCULAR | Status: DC | PRN

## 2021-06-29 MED ORDER — EPHEDRINE SULFATE-NACL 50-0.9 MG/10ML-% IV SOSY
PREFILLED_SYRINGE | INTRAVENOUS | Status: DC | PRN
  Administered 2021-06-29: 5 mg via INTRAVENOUS

## 2021-06-29 MED ORDER — PHENYLEPHRINE HCL (PRESSORS) 10 MG/ML IV SOLN
INTRAVENOUS | Status: DC | PRN
Start: 2021-06-29 — End: 2021-06-29
  Administered 2021-06-29 (×2): 120 ug via INTRAVENOUS
  Administered 2021-06-29 (×2): 80 ug via INTRAVENOUS

## 2021-06-29 MED ORDER — FENTANYL CITRATE (PF) 100 MCG/2ML IJ SOLN: 25.0000 ug | INTRAMUSCULAR | Status: DC | PRN

## 2021-06-29 MED ORDER — PROPOFOL 10 MG/ML IV BOLUS
INTRAVENOUS | Status: DC | PRN
  Administered 2021-06-29: 80 mg via INTRAVENOUS

## 2021-06-29 MED ORDER — ALUM & MAG HYDROXIDE-SIMETH 200-200-20 MG/5ML PO SUSP
30.0000 mL | Freq: Once | ORAL | Status: AC
  Administered 2021-06-29: 30 mL via ORAL
  Filled 2021-06-29: qty 30

## 2021-06-29 MED ORDER — CEFAZOLIN SODIUM-DEXTROSE 2-4 GM/100ML-% IV SOLN
INTRAVENOUS | Status: AC
  Filled 2021-06-29: qty 100

## 2021-06-29 MED ORDER — MIDAZOLAM HCL 2 MG/2ML IJ SOLN
INTRAMUSCULAR | Status: AC
  Filled 2021-06-29: qty 2

## 2021-06-29 MED ORDER — HYDROMORPHONE HCL 1 MG/ML IJ SOLN
1.0000 mg | INTRAMUSCULAR | Status: DC | PRN
  Administered 2021-07-01 – 2021-07-15 (×36): 1 mg via INTRAVENOUS
  Filled 2021-06-29 (×37): qty 1

## 2021-06-29 MED ORDER — PHENOL 1.4 % MT LIQD: 1.0000 | OROMUCOSAL | Status: DC | PRN

## 2021-06-29 MED ORDER — MENTHOL 3 MG MT LOZG
1.0000 | LOZENGE | OROMUCOSAL | Status: DC | PRN
  Filled 2021-06-29: qty 9

## 2021-06-29 MED ORDER — CHLORHEXIDINE GLUCONATE 0.12 % MT SOLN
15.0000 mL | Freq: Once | OROMUCOSAL | Status: AC
  Administered 2021-06-29: 15 mL via OROMUCOSAL
  Filled 2021-06-29: qty 15

## 2021-06-29 MED ORDER — THROMBIN 20000 UNITS EX SOLR
CUTANEOUS | Status: AC
  Filled 2021-06-29: qty 20000

## 2021-06-29 MED ORDER — CYCLOBENZAPRINE HCL 10 MG PO TABS
10.0000 mg | ORAL_TABLET | Freq: Three times a day (TID) | ORAL | Status: DC | PRN
  Administered 2021-06-29 – 2021-07-07 (×7): 10 mg via ORAL
  Filled 2021-06-29 (×7): qty 1

## 2021-06-29 MED ORDER — ACETAMINOPHEN 500 MG PO TABS
1000.0000 mg | ORAL_TABLET | Freq: Once | ORAL | Status: AC
Start: 2021-06-29 — End: 2021-06-29
  Administered 2021-06-29: 1000 mg via ORAL
  Filled 2021-06-29: qty 2

## 2021-06-29 MED ORDER — ONDANSETRON HCL 4 MG/2ML IJ SOLN
INTRAMUSCULAR | Status: DC | PRN
  Administered 2021-06-29: 4 mg via INTRAVENOUS

## 2021-06-29 MED ORDER — ACETAMINOPHEN 650 MG RE SUPP
650.0000 mg | RECTAL | Status: DC | PRN
  Administered 2021-07-02: 650 mg via RECTAL
  Filled 2021-06-29: qty 1

## 2021-06-29 MED ORDER — CEFAZOLIN SODIUM-DEXTROSE 1-4 GM/50ML-% IV SOLN
1.0000 g | Freq: Three times a day (TID) | INTRAVENOUS | Status: AC
  Administered 2021-06-29 (×2): 1 g via INTRAVENOUS
  Filled 2021-06-29 (×2): qty 50

## 2021-06-29 MED ORDER — PHENYLEPHRINE HCL-NACL 20-0.9 MG/250ML-% IV SOLN
INTRAVENOUS | Status: DC | PRN
  Administered 2021-06-29: 10 ug/min via INTRAVENOUS

## 2021-06-29 MED ORDER — EPHEDRINE 5 MG/ML INJ
INTRAVENOUS | Status: AC
  Filled 2021-06-29: qty 5

## 2021-06-29 SURGICAL SUPPLY — 60 items
BAG COUNTER SPONGE SURGICOUNT (BAG) ×3 IMPLANT
BAG DECANTER FOR FLEXI CONT (MISCELLANEOUS) ×2 IMPLANT
BAND RUBBER #18 3X1/16 STRL (MISCELLANEOUS) ×4 IMPLANT
BENZOIN TINCTURE PRP APPL 2/3 (GAUZE/BANDAGES/DRESSINGS) ×2 IMPLANT
BIT DRILL 13 (BIT) ×1 IMPLANT
BUR MATCHSTICK NEURO 3.0 LAGG (BURR) ×2 IMPLANT
CAGE PEEK 6X14X11 (Cage) ×8 IMPLANT
CANISTER SUCT 3000ML PPV (MISCELLANEOUS) ×2 IMPLANT
CARTRIDGE OIL MAESTRO DRILL (MISCELLANEOUS) ×1 IMPLANT
DIFFUSER DRILL AIR PNEUMATIC (MISCELLANEOUS) ×2 IMPLANT
DRAPE C-ARM 42X72 X-RAY (DRAPES) ×4 IMPLANT
DRAPE LAPAROTOMY 100X72 PEDS (DRAPES) ×2 IMPLANT
DRAPE MICROSCOPE LEICA (MISCELLANEOUS) ×2 IMPLANT
DRSG OPSITE 4X5.5 SM (GAUZE/BANDAGES/DRESSINGS) ×1 IMPLANT
DURAPREP 6ML APPLICATOR 50/CS (WOUND CARE) ×2 IMPLANT
ELECT COATED BLADE 2.86 ST (ELECTRODE) ×2 IMPLANT
ELECT REM PT RETURN 9FT ADLT (ELECTROSURGICAL) ×2
ELECTRODE REM PT RTRN 9FT ADLT (ELECTROSURGICAL) ×1 IMPLANT
EVACUATOR 1/8 PVC DRAIN (DRAIN) ×1 IMPLANT
GAUZE 4X4 16PLY ~~LOC~~+RFID DBL (SPONGE) ×1 IMPLANT
GAUZE SPONGE 4X4 12PLY STRL (GAUZE/BANDAGES/DRESSINGS) ×2 IMPLANT
GAUZE SPONGE 4X4 12PLY STRL LF (GAUZE/BANDAGES/DRESSINGS) ×1 IMPLANT
GLOVE EXAM NITRILE XL STR (GLOVE) IMPLANT
GLOVE SURG ENC MOIS LTX SZ6.5 (GLOVE) ×2 IMPLANT
GLOVE SURG LTX SZ9 (GLOVE) ×2 IMPLANT
GLOVE SURG UNDER POLY LF SZ6.5 (GLOVE) ×2 IMPLANT
GOWN STRL REUS W/ TWL LRG LVL3 (GOWN DISPOSABLE) IMPLANT
GOWN STRL REUS W/ TWL XL LVL3 (GOWN DISPOSABLE) IMPLANT
GOWN STRL REUS W/TWL 2XL LVL3 (GOWN DISPOSABLE) IMPLANT
GOWN STRL REUS W/TWL LRG LVL3 (GOWN DISPOSABLE)
GOWN STRL REUS W/TWL XL LVL3 (GOWN DISPOSABLE)
HALTER HD/CHIN CERV TRACTION D (MISCELLANEOUS) ×2 IMPLANT
HEMOSTAT POWDER KIT SURGIFOAM (HEMOSTASIS) ×3 IMPLANT
KIT BASIN OR (CUSTOM PROCEDURE TRAY) ×2 IMPLANT
KIT TURNOVER KIT B (KITS) ×2 IMPLANT
NDL SPNL 20GX3.5 QUINCKE YW (NEEDLE) ×1 IMPLANT
NEEDLE SPNL 20GX3.5 QUINCKE YW (NEEDLE) ×2 IMPLANT
NS IRRIG 1000ML POUR BTL (IV SOLUTION) ×2 IMPLANT
OIL CARTRIDGE MAESTRO DRILL (MISCELLANEOUS) ×2
PACK LAMINECTOMY NEURO (CUSTOM PROCEDURE TRAY) ×2 IMPLANT
PAD ARMBOARD 7.5X6 YLW CONV (MISCELLANEOUS) ×6 IMPLANT
PLATE 4 75XNS SPNE CVD ANT T (Plate) IMPLANT
PLATE 4 ATLANTIS TRANS (Plate) ×2 IMPLANT
SCREW ST FIX 4 ATL 3120213 (Screw) ×10 IMPLANT
SPACER SPNL 11X14X6XPEEK CVD (Cage) IMPLANT
SPCR SPNL 11X14X6XPEEK CVD (Cage) ×4 IMPLANT
SPONGE INTESTINAL PEANUT (DISPOSABLE) ×2 IMPLANT
SPONGE SURGIFOAM ABS GEL 100 (HEMOSTASIS) ×2 IMPLANT
SPONGE T-LAP 4X18 ~~LOC~~+RFID (SPONGE) ×1 IMPLANT
STRIP CLOSURE SKIN 1/2X4 (GAUZE/BANDAGES/DRESSINGS) ×2 IMPLANT
SUT VIC AB 3-0 SH 8-18 (SUTURE) ×2 IMPLANT
SUT VIC AB 4-0 RB1 18 (SUTURE) ×2 IMPLANT
TAPE CLOTH 4X10 WHT NS (GAUZE/BANDAGES/DRESSINGS) ×2 IMPLANT
TAPE CLOTH SURG 4X10 WHT LF (GAUZE/BANDAGES/DRESSINGS) ×1 IMPLANT
TOWEL GREEN STERILE (TOWEL DISPOSABLE) ×2 IMPLANT
TOWEL GREEN STERILE FF (TOWEL DISPOSABLE) ×2 IMPLANT
TRAP SPECIMEN MUCUS 40CC (MISCELLANEOUS) ×2 IMPLANT
TRAY FOL W/BAG SLVR 16FR STRL (SET/KITS/TRAYS/PACK) IMPLANT
TRAY FOLEY W/BAG SLVR 16FR LF (SET/KITS/TRAYS/PACK) ×2
WATER STERILE IRR 1000ML POUR (IV SOLUTION) ×2 IMPLANT

## 2021-06-29 NOTE — Progress Notes (Signed)
Orthopedic Tech Progress Note Patient Details:  Brian [name] [birth year] [id number]  Ortho Devices Type of Ortho Device: Soft collar Ortho Device/Splint Interventions: Ordered, Application   Post Interventions Patient Tolerated: Well  [name] 06/29/2021, 12:23 PM

## 2021-06-29 NOTE — Anesthesia Procedure Notes (Signed)
Procedure Name: Intubation Date/Time: 06/29/2021 8:03 AM Performed by: Clearnce Sorrel, CRNA Pre-anesthesia Checklist: Patient identified, Emergency Drugs available, Suction available and Patient being monitored Patient Re-evaluated:Patient Re-evaluated prior to induction Oxygen Delivery Method: Circle System Utilized Preoxygenation: Pre-oxygenation with 100% oxygen Induction Type: IV induction Ventilation: Mask ventilation without difficulty Laryngoscope Size: Glidescope and 4 Grade View: Grade I Tube type: Oral Tube size: 7.5 mm Number of attempts: 1 Airway Equipment and Method: Stylet and Oral airway Placement Confirmation: ETT inserted through vocal cords under direct vision, positive ETCO2 and breath sounds checked- equal and bilateral Secured at: 24 cm Tube secured with: Tape Dental Injury: Teeth and Oropharynx as per pre-operative assessment

## 2021-06-29 NOTE — Transfer of Care (Signed)
Immediate Anesthesia Transfer of Care Note  Patient: Brian [name]  Procedure(s) Performed: Anterior Cervical Discectomy Fusion - Cervical Three-Cervical Four - Cervical Four- Cervical Five - Cervical Five- Cervical Six - Cervical Six- Cervical Seven  Patient Location: PACU  Anesthesia Type:General  Level of Consciousness: awake, alert  and oriented  Airway & Oxygen Therapy: Patient Spontanous Breathing and Patient connected to face mask oxygen  Post-op Assessment: Report given to RN and Post -op Vital signs reviewed and stable  Post vital signs: Reviewed and stable  Last Vitals:  Vitals Value Taken Time  BP 142/89 06/29/21 1134  Temp 36.8 C 06/29/21 1134  Pulse 96 06/29/21 1137  Resp 20 06/29/21 1137  SpO2 99 % 06/29/21 1137  Vitals shown include unvalidated device data.  Last Pain:  Vitals:   06/29/21 0616  TempSrc:   PainSc: 2       Patients Stated Pain Goal: 2 (06/29/21 5035)  Complications: No notable events documented.

## 2021-06-29 NOTE — H&P (Signed)
Brian [name] is an 61 y.o. male.   Chief Complaint: Weakness HPI: 61 year old male with progressive bilateral upper and lower extremity weakness is status post recent fall with profound worsening of his weakness and spasticity.  Work-up demonstrates evidence of critical multilevel cervical spinal stenosis with spinal cord compression and spinal cord signal change.  The patient presents now for multilevel anterior cervical decompression and fusion surgery in hopes of improving her symptoms.  History reviewed. No pertinent past medical history.  History reviewed. No pertinent surgical history.  History reviewed. No pertinent family history. Social History:  reports that he has been smoking cigarettes. He has been smoking an average of .33 packs per day. He has never used smokeless tobacco. He reports that he does not currently use alcohol. He reports current drug use. Drug: Marijuana.  Allergies: No Known Allergies  Medications Prior to Admission  Medication Sig Dispense Refill   naproxen sodium (ALEVE) 220 MG tablet Take 440 mg by mouth 2 (two) times daily as needed (pain.).     methylPREDNISolone (MEDROL DOSEPAK) 4 MG TBPK tablet Take as prescribed (Patient not taking: Reported on 06/27/2021) 1 each 0    Results for orders placed or performed during the hospital encounter of 06/29/21 (from the past 48 hour(s))  SARS Coronavirus 2 by RT PCR (hospital order, performed in [facility] hospital lab) Nasopharyngeal Nasopharyngeal Swab     Status: None   Collection Time: 06/29/21  6:00 AM   Specimen: Nasopharyngeal Swab  Result Value Ref Range   SARS Coronavirus 2 NEGATIVE NEGATIVE    Comment: (NOTE) SARS-CoV-2 target nucleic acids are NOT DETECTED.  The SARS-CoV-2 RNA is generally detectable in upper and lower respiratory specimens during the acute phase of infection. The lowest concentration of SARS-CoV-2 viral copies this assay can detect is 250 copies / mL. A negative result does not  preclude SARS-CoV-2 infection and should not be used as the sole basis for treatment or other patient management decisions.  A negative result may occur with improper specimen collection / handling, submission of specimen other than nasopharyngeal swab, presence of viral mutation(s) within the areas targeted by this assay, and inadequate number of viral copies (<250 copies / mL). A negative result must be combined with clinical observations, patient history, and epidemiological information.  Fact Sheet for Patients:   BoilerBrush.com.cy  Fact Sheet for Healthcare Providers: https://pope.com/  This test is not yet approved or  cleared by the [place] FDA and has been authorized for detection and/or diagnosis of SARS-CoV-2 by FDA under an Emergency Use Authorization (EUA).  This EUA will remain in effect (meaning this test can be used) for the duration of the COVID-19 declaration under Section 564(b)(1) of the Act, 21 U.S.C. section 360bbb-3(b)(1), unless the authorization is terminated or revoked sooner.  Performed at [facility], [address], [place], [place] [address]   Type and screen [facility]     Status: None   Collection Time: 06/29/21  6:30 AM  Result Value Ref Range   ABO/RH(D) O POS    Antibody Screen NEG    Sample Expiration      07/02/2021,2359 Performed at [facility], [address] [place], [place] [address]   ABO/Rh     Status: None   Collection Time: 06/29/21  6:38 AM  Result Value Ref Range   ABO/RH(D)      O POS Performed at [facility], [address] [place], [place]  [address]    No results found.  Pertinent items noted in HPI and remainder of comprehensive ROS otherwise negative.  Blood pressure (!) 135/93, pulse (!) 102, temperature 98.1 F (36.7 C), temperature source Oral, resp. rate 17, height 5\' 10"  (1.778 m), weight 63.5 kg, SpO2 99 %.  Patient  is awake and alert.  He is oriented and appropriate.  Speech is fluent.  Judgment and insight are intact.  Cranial nerve function normal bilateral.  Motor examination reveals 4/5 weakness in both deltoids and biceps.  He has 4 - or 5 weakness in his triceps and wrist extensors.  He has 3/5 weakness in his hands and intrinsics.  He has 4-/5 weakness in both lower extremities with marked spasticity.  He is hyperreflexic.  He has Hoffmann's responses.  He has sensory loss distally from C5.  Examination head ears eyes nose and throat is unremarked.  Chest and abdomen are benign.  Extremities are free from injury or deformity. Assessment/Plan Cervical stenosis with severe myelopathy.  Plan C3-4, C4-5, C5-6, C6-7 anterior cervical discectomy with interbody fusion utilizing interbody cages, local harvested autograft, and anterior plate instrumentation.  Risks and benefits been explained.  Patient wishes to proceed.  Tesia Lybrand 06/29/2021, 7:42 AM

## 2021-06-29 NOTE — Anesthesia Postprocedure Evaluation (Signed)
Anesthesia Post Note  Patient: Brian [name]  Procedure(s) Performed: Anterior Cervical Discectomy Fusion - Cervical Three-Cervical Four - Cervical Four- Cervical Five - Cervical Five- Cervical Six - Cervical Six- Cervical Seven     Patient location during evaluation: PACU Anesthesia Type: General Level of consciousness: awake and alert Pain management: pain level controlled Vital Signs Assessment: post-procedure vital signs reviewed and stable Respiratory status: spontaneous breathing, nonlabored ventilation, respiratory function stable and patient connected to nasal cannula oxygen Cardiovascular status: blood pressure returned to baseline and stable Postop Assessment: no apparent nausea or vomiting Anesthetic complications: no   No notable events documented.  Last Vitals:  Vitals:   06/29/21 1221 06/29/21 1612  BP: 118/77 120/68  Pulse: 84 78  Resp: 18 18  Temp:  36.8 C  SpO2: 96% 100%    Last Pain:  Vitals:   06/29/21 1612  TempSrc: Oral  PainSc:                  Collene Schlichter

## 2021-06-29 NOTE — Op Note (Signed)
Date of procedure: 06/29/2021  Date of dictation: Same  Service: Neurosurgery  Preoperative diagnosis: Cervical stenosis with myelopathy  Postoperative diagnosis: Same  Procedure Name: C3-4, C4-5, C5-6, C6-7 anterior cervical discectomy with interbody fusion utilizing interbody cages, local harvested autograft, and anterior plate instrumentation  Surgeon:Dalten Ambrosino A.Jazsmin Couse, M.D.  Asst. Surgeon: Doran Durand, NP  Anesthesia: General  Indication: 61 year old male with progressive cervical myelopathy symptoms with subsequent fall and a severe incomplete spinal cord injury.  Work-up demonstrates evidence of critical multilevel spinal stenosis with severe cord compression and high signal abnormality within the cord at C4-5.  Patient presents now for multilevel anterior cervical decompression and fusion in hopes of improving his situation.  Operative note: After induction of anesthesia, patient positioned supine with neck slightly extended and held placed halter traction.  Patient's anterior cervical region prepped and draped sterilely.  Incision made overlying C5.  Dissection performed on the right.  Retractor placed.  Fluoroscopy used.  Levels confirmed.  Disc spaces at C3-4, C4-5, C5-6 and C6-7 were incised.  Anterior osteophytes were resected.  Discectomies then performed using various instruments down to level of the posterior annulus.  Microscope was then brought to the field used throughout the remainder of the discectomies.  Remaining aspects of annulus and osteophytes removed using high-speed drill down to the level of the posterior longitudinal ligament.  Posterior margin was then elevated and resected in a piecemeal fashion with Kerrison rongeurs.  Underlying thecal sac was identified.  A wide central decompression then performed undercutting the bodies of C3 and C4.  Decompression then proceeded to each neural foramina.  Wide anterior foraminotomies were performed on the course exiting C4 nerve roots  bilaterally.  At this point a very thorough decompression of been achieved.  There was no evidence of injury to the thecal sac and nerve roots.  Procedures then repeated at C4-5, C5-6 and C6-7 again without complications.  Wound was then irrigated.  Gelfoam and Surgifoam was placed topically for hemostasis then removed.  [employer] anatomic peek cages were then packed with locally harvested autograft.  Cage was then impacted in the place at all 4 levels.  Each cage was recessed slightly from the anterior cortical margin.  Atlantis translational plate was then placed over the C3-C7 levels.  This was then attached under fluoroscopic guidance using 13 mm fixed angle screws to each at all 5 levels.  All screws given final tightening found to be solidly within the bone.  Locking screws engaged all levels.  Final images reveal good position of the cages and the hardware at the proper upper level with normal alignment of the spine.  Wound is then irrigated.  A medium Hemovac drain was left in the prevertebral space.  Wound is then closed in layers with Vicryl sutures.  Steri-Strips and sterile dressing were applied.  No apparent complications.  Patient tolerated the procedure well and he returns to the recovery room postop.

## 2021-06-29 NOTE — Brief Op Note (Signed)
06/29/2021  11:12 AM  PATIENT:  Ebony Cargo  61 y.o. male  PRE-OPERATIVE DIAGNOSIS:  Stenosis  POST-OPERATIVE DIAGNOSIS:  Stenosis  PROCEDURE:  Procedure(s): Anterior Cervical Discectomy Fusion - Cervical Three-Cervical Four - Cervical Four- Cervical Five - Cervical Five- Cervical Six - Cervical Six- Cervical Seven (N/A)  SURGEON:  Surgeon(s) and Role:    * Earnie Larsson, MD - Primary  PHYSICIAN ASSISTANT:   ASSISTANTSMearl Latin   ANESTHESIA:   general  EBL:  250 mL   BLOOD ADMINISTERED:none  DRAINS: (med) Hemovact drain(s) in the prevertebral space  with  Suction Open   LOCAL MEDICATIONS USED:  NONE  SPECIMEN:  No Specimen  DISPOSITION OF SPECIMEN:  N/A  COUNTS:  YES  TOURNIQUET:  * No tourniquets in log *  DICTATION: .Dragon Dictation  PLAN OF CARE: Admit to inpatient   PATIENT DISPOSITION:  PACU - hemodynamically stable.   Delay start of Pharmacological VTE agent (>24hrs) due to surgical blood loss or risk of bleeding: yes

## 2021-06-30 NOTE — Progress Notes (Signed)
OT Cancellation Note  Patient Details Name: Brian [name] MRN: [id number] DOB: 22-Jan-1961   Cancelled Treatment:    Reason Eval/Treat Not Completed: Pain limiting ability to participate (RN asked for OT to come back as pt as in a lot of pain and had just received pain medicine and needs to rest.) OT will come back later this AM to evaluate patient.  Flora Lipps, OTR/L Acute Rehabilitation Services Office: [phone]   Lonzo Cloud 06/30/2021, 7:56 AM

## 2021-06-30 NOTE — Progress Notes (Signed)
Inpatient Rehab Admissions Coordinator:   I spoke with Pt. Regarding potential CIR admit. He states that he is not currently interested and wants to go home. I will notify TOC and check in with pt. Monday in case he changes his mind.   Megan Salon, MS, CCC-SLP Rehab Admissions Coordinator  [phone] (celll) [phone] (office)

## 2021-06-30 NOTE — Plan of Care (Signed)
  Problem: Education: Goal: Knowledge of the prescribed therapeutic regimen will improve Outcome: Progressing Goal: Understanding of discharge needs will improve Outcome: Progressing   Problem: Activity: Goal: Ability to avoid complications of mobility impairment will improve Outcome: Progressing   

## 2021-06-30 NOTE — Plan of Care (Signed)
Patient transferred to 5N around 1500. VSS. C/o pain, PRN pain medication given prior to transfer. Belongings within reach. No complaints at this time.   Problem: Activity: Goal: Ability to avoid complications of mobility impairment will improve Outcome: Progressing Goal: Ability to tolerate increased activity will improve Outcome: Progressing Goal: Will remain free from falls Outcome: Progressing   Problem: Clinical Measurements: Goal: Ability to maintain clinical measurements within normal limits will improve Outcome: Progressing Goal: Postoperative complications will be avoided or minimized Outcome: Progressing Goal: Diagnostic test results will improve Outcome: Progressing   Problem: Pain Management: Goal: Pain level will decrease Outcome: Progressing   Problem: Skin Integrity: Goal: Will show signs of wound healing Outcome: Progressing   Problem: Safety: Goal: Ability to remain free from injury will improve Outcome: Progressing

## 2021-06-30 NOTE — Evaluation (Signed)
Physical Therapy Evaluation Patient Details Name: Brian [name] MRN: [id number] DOB: 1961/02/01 Today's Date: 06/30/2021  History of Present Illness  61 y/o admitted 06/29/21 following C3-7 ACDF after progressive cervical myelopathy symptoms with subsequent fall and severe incomplete SCI. No significant PMH.  Clinical Impression  Patient admitted following above procedure. Patient presents with weakness, impaired balance, decreased activity tolerance, and impaired coordination. Patient requiring up to [facility] for ambulation with no AD due to ataxia. Patient was living alone with flight of stairs to access apartment PTA. Patient motivated to return to independence and anticipate with intensive rehab, patient will be able to achieve modI. Patient will benefit from skilled PT services during acute stay to address listed deficits. Recommend CIR at discharge to maximize functional independence and assist with return to PLOF.      Recommendations for follow up therapy are one component of a multi-disciplinary discharge planning process, led by the attending physician.  Recommendations may be updated based on patient status, additional functional criteria and insurance authorization.  Follow Up Recommendations Acute inpatient rehab (3hours/day)    Assistance Recommended at Discharge Frequent or constant Supervision/Assistance  Patient can return home with the following  A little help with walking and/or transfers;A little help with bathing/dressing/bathroom;Assistance with cooking/housework;Assistance with feeding;Assist for transportation;Help with stairs or ramp for entrance    Equipment Recommendations Other (comment) (TBD)  Recommendations for Other Services  Rehab consult    Functional Status Assessment Patient has had a recent decline in their functional status and demonstrates the ability to make significant improvements in function in a reasonable and predictable amount of time.      Precautions / Restrictions Precautions Precautions: Cervical;Fall Precaution Booklet Issued: Yes (comment) Required Braces or Orthoses: Cervical Brace Cervical Brace: Soft collar;At all times Restrictions Weight Bearing Restrictions: No      Mobility  Bed Mobility Overal bed mobility: Needs Assistance Bed Mobility: Rolling, Sidelying to Sit, Sit to Sidelying Rolling: Min assist Sidelying to sit: Min assist, HOB elevated     Sit to sidelying: Min guard, HOB elevated General bed mobility comments: minA for rolling towards L side and trunk elevation with HOB elevated    Transfers Overall transfer level: Needs assistance Equipment used: Rolling Janavia Rottman (2 wheels) Transfers: Sit to/from Stand Sit to Stand: Min assist           General transfer comment: multiple attempts to stand without assistance but unable to generate momentum. MinA for boost up into standing    Ambulation/Gait Ambulation/Gait assistance: Min assist, Mod assist, +2 safety/equipment Gait Distance (Feet): 150 Feet Assistive device: Rolling Matrice Herro (2 wheels), 2 person hand held assist, 1 person hand held assist, None Gait Pattern/deviations: Ataxic, Decreased stride length, Step-through pattern, Drifts right/left Gait velocity: decreased     General Gait Details: ataxic gait pattern (L>R). Initially started with RW progressing to HHAx2, HHAx1, and no AD. Fluctuating with min-modA for balance and +2 for safety  Stairs            Wheelchair Mobility    Modified Rankin (Stroke Patients Only)       Balance Overall balance assessment: Needs assistance Sitting-balance support: No upper extremity supported, Feet supported Sitting balance-Leahy Scale: Fair     Standing balance support: No upper extremity supported, During functional activity Standing balance-Leahy Scale: Poor Standing balance comment: min-modA to maintain balance during ambulation  Pertinent Vitals/Pain Pain Assessment Pain Assessment: Faces Faces Pain Scale: Hurts even more Pain Location: neck, shoulders Pain Descriptors / Indicators: Grimacing, Guarding Pain Intervention(s): Monitored during session    Home Living Family/patient expects to be discharged to:: Private residence Living Arrangements: Alone Available Help at Discharge: Friend(s);Available PRN/intermittently Type of Home: Apartment Home Access: Stairs to enter   Entrance Stairs-Number of Steps: flight   Home Layout: One level Home Equipment: None      Prior Function Prior Level of Function : Independent/Modified Independent;Working/employed;Driving;History of Falls (last six months)             Mobility Comments: reports 2 falls in past 1-2 weeks ADLs Comments: sponge bathing recently due to fear of falling in shower like previous fall     Hand Dominance        Extremity/Trunk Assessment   Upper Extremity Assessment Upper Extremity Assessment: Defer to OT evaluation    Lower Extremity Assessment Lower Extremity Assessment: RLE deficits/detail;LLE deficits/detail RLE Deficits / Details: grossly 3-/5 RLE Coordination: decreased gross motor;decreased fine motor LLE Deficits / Details: grossly 3-/5 LLE Coordination: decreased fine motor;decreased gross motor    Cervical / Trunk Assessment Cervical / Trunk Assessment: Neck Surgery  Communication   Communication: No difficulties  Cognition Arousal/Alertness: Awake/alert Behavior During Therapy: WFL for tasks assessed/performed Overall Cognitive Status: Within Functional Limits for tasks assessed                                          General Comments      Exercises     Assessment/Plan    PT Assessment Patient needs continued PT services  PT Problem List Decreased strength;Decreased balance;Decreased activity tolerance;Decreased mobility;Decreased coordination;Decreased knowledge of use of  DME;Decreased safety awareness;Decreased knowledge of precautions       PT Treatment Interventions DME instruction;Gait training;Stair training;Functional mobility training;Therapeutic activities;Balance training;Therapeutic exercise;Neuromuscular re-education;Patient/family education    PT Goals (Current goals can be found in the Care Plan section)  Acute Rehab PT Goals Patient Stated Goal: to get better and be independent PT Goal Formulation: With patient Time For Goal Achievement: 07/14/21 Potential to Achieve Goals: Good    Frequency Min 5X/week     Co-evaluation               AM-PAC PT "6 Clicks" Mobility  Outcome Measure Help needed turning from your back to your side while in a flat bed without using bedrails?: A Little Help needed moving from lying on your back to sitting on the side of a flat bed without using bedrails?: A Little Help needed moving to and from a bed to a chair (including a wheelchair)?: A Little Help needed standing up from a chair using your arms (e.g., wheelchair or bedside chair)?: A Little Help needed to walk in hospital room?: A Lot Help needed climbing 3-5 steps with a railing? : Total 6 Click Score: 15    End of Session Equipment Utilized During Treatment: Gait belt;Cervical collar Activity Tolerance: Patient tolerated treatment well Patient left: in bed;with call bell/phone within reach;with bed alarm set Nurse Communication: Mobility status PT Visit Diagnosis: Unsteadiness on feet (R26.81);History of falling (Z91.81);Ataxic gait (R26.0);Muscle weakness (generalized) (M62.81);Other symptoms and signs involving the nervous system (R29.898);Difficulty in walking, not elsewhere classified (R26.2)    Time: [id number] PT Time Calculation (min) (ACUTE ONLY): 34 min   Charges:   PT Evaluation $PT Eval  Moderate Complexity: 1 Mod          Shashank Kwasnik A. Gilford Rile PT, DPT Acute Rehabilitation Services Pager [phone] Office  [phone]   Linna Hoff 06/30/2021, 9:11 AM

## 2021-06-30 NOTE — Progress Notes (Signed)
Postop day 1.  Patient with a little bit of anterior and posterior cervical pain today.  His upper extremities continue to be improved following surgery.  He has less dysesthetic pain.  He has better movement and control of his hands.  The patient has ongoing spastic weakness in both lower extremities and has not progressed significantly to the point where he feels safe with discharge home.  On examination he is awake and alert.  He is oriented and appropriate.  Wound is clean and dry.  Motor examination with 4+/5 strength in his deltoids and biceps and triceps.  He has 3/5 strength in his grips.  He has 4 - or 5 strength in his intrinsics.  He has 4+/5 strength with spasticity in both lower extremities.  Progressing reasonably well following multilevel anterior cervical decompression and fusion surgery for treatment of his profound cervical myelopathy and superimposed recent incomplete spinal cord injury after a fall.  Continue efforts at therapy.  Transfer to a regular floor bed.  Patient may require inpatient rehabilitation.

## 2021-06-30 NOTE — Evaluation (Signed)
Occupational Therapy Evaluation Patient Details Name: Brian [name] MRN: [id number] DOB: 10-14-1960 Today's Date: 06/30/2021   History of Present Illness 61 y/o admitted 06/29/21 following C3-7 ACDF after progressive cervical myelopathy symptoms with subsequent fall and severe incomplete SCI. No significant PMH.   Clinical Impression   Pt PLOF: Pt independent with ADL and mobility with no AD. Pt working in maintenance. Pt currently, with decreased strength, decreased ability to care for self, decreased smooth coordinated movements in BUEs and BLEs. Appears worse on L side. Pt is R hand dominant. Given built up foam for utensil use as he reports his spoon was slipping through his hands. Sensation appears intact. Pt minA for sit to stand and modA +1-2 for safety with movement in hallway with RW and +2 assist for no AD. Pt minA to maxA overall for ADL tasks. Pt would benefit from continued OT skilled services. OT following acutely.     Recommendations for follow up therapy are one component of a multi-disciplinary discharge planning process, led by the attending physician.  Recommendations may be updated based on patient status, additional functional criteria and insurance authorization.   Follow Up Recommendations  Acute inpatient rehab (3hours/day)    Assistance Recommended at Discharge Frequent or constant Supervision/Assistance  Patient can return home with the following A lot of help with bathing/dressing/bathroom;A lot of help with walking and/or transfers    Functional Status Assessment  Patient has had a recent decline in their functional status and demonstrates the ability to make significant improvements in function in a reasonable and predictable amount of time.  Equipment Recommendations  BSC/3in1    Recommendations for Other Services Rehab consult     Precautions / Restrictions Precautions Precautions: Cervical;Fall Precaution Booklet Issued: Yes (comment) Required Braces  or Orthoses: Cervical Brace Cervical Brace: Soft collar;At all times Restrictions Weight Bearing Restrictions: No      Mobility Bed Mobility Overal bed mobility: Needs Assistance Bed Mobility: Rolling, Sidelying to Sit, Sit to Sidelying Rolling: Min assist Sidelying to sit: Min assist, HOB elevated     Sit to sidelying: Min guard, HOB elevated General bed mobility comments: minA for rolling towards L side and trunk elevation with HOB elevated    Transfers Overall transfer level: Needs assistance Equipment used: Rolling walker (2 wheels) Transfers: Sit to/from Stand Sit to Stand: Min assist           General transfer comment: multiple attempts to stand without assistance but unable to generate momentum. MinA for boost up into standing      Balance Overall balance assessment: Needs assistance Sitting-balance support: No upper extremity supported, Feet supported Sitting balance-Leahy Scale: Fair     Standing balance support: No upper extremity supported, During functional activity Standing balance-Leahy Scale: Poor Standing balance comment: min-modA to maintain balance during ambulation                           ADL either performed or assessed with clinical judgement   ADL Overall ADL's : Needs assistance/impaired Eating/Feeding: Minimal assistance;Sitting;Bed level;With adaptive utensils Eating/Feeding Details (indicate cue type and reason): given built up handle; reports stabbing self with fork in the mouth. Grooming: Minimal assistance;Sitting   Upper Body Bathing: Moderate assistance;Sitting   Lower Body Bathing: Maximal assistance;Sitting/lateral leans;Sit to/from stand   Upper Body Dressing : Moderate assistance;Sitting;Standing   Lower Body Dressing: Maximal assistance;Sitting/lateral leans;Sit to/from stand;Cueing for safety  Vision Baseline Vision/History: 0 No visual deficits Ability to See in Adequate Light:  0 Adequate Patient Visual Report: No change from baseline Vision Assessment?: No apparent visual deficits     Perception     Praxis      Pertinent Vitals/Pain Pain Assessment Pain Assessment: Faces Faces Pain Scale: Hurts even more Pain Location: neck, shoulders Pain Descriptors / Indicators: Grimacing, Guarding Pain Intervention(s): Monitored during session, Premedicated before session, Repositioned     Hand Dominance Right   Extremity/Trunk Assessment Upper Extremity Assessment Upper Extremity Assessment: RUE deficits/detail;LUE deficits/detail RUE Deficits / Details: shoulder 2-/5 strength; elbow 2-/5 strength, wrist and hand 2 to 2-/5; digits 2-/5 RUE Sensation: WNL RUE Coordination: decreased fine motor;decreased gross motor LUE Deficits / Details: shoulder 2-/5 strength; elbow 2-/5 strength, wrist and hand 2 to 2-/5; digits 2-/5 LUE Sensation: WNL LUE Coordination: decreased fine motor;decreased gross motor   Lower Extremity Assessment Lower Extremity Assessment: Defer to PT evaluation;Generalized weakness RLE Deficits / Details: ataxia and poor strength noted RLE Coordination: decreased gross motor;decreased fine motor LLE Deficits / Details: ataxia and poor strength noted LLE Coordination: decreased fine motor;decreased gross motor   Cervical / Trunk Assessment Cervical / Trunk Assessment: Neck Surgery   Communication Communication Communication: No difficulties   Cognition Arousal/Alertness: Awake/alert Behavior During Therapy: WFL for tasks assessed/performed Overall Cognitive Status: Within Functional Limits for tasks assessed                                       General Comments  His supervisor is his main contact. He was very clear that he does not rely on family for support and he lives a reclusive lifestyle- "I stay at home on the weekends and after work. I prefer it that way."    Exercises     Shoulder Instructions      Home  Living Family/patient expects to be discharged to:: Private residence Living Arrangements: Alone Available Help at Discharge: Friend(s);Available PRN/intermittently Type of Home: Apartment Home Access: Stairs to enter Entrance Stairs-Number of Steps: flight   Home Layout: One level     Bathroom Shower/Tub: Tub/shower unit (sponge bathing recently)   Bathroom Toilet: Standard     Home Equipment: None          Prior Functioning/Environment Prior Level of Function : Independent/Modified Independent;Working/employed;Driving;History of Falls (last six months)             Mobility Comments: reports 2 falls in past 1-2 weeks ADLs Comments: sponge bathing recently due to fear of falling in shower like previous fall        OT Problem List: Decreased strength;Decreased activity tolerance;Impaired balance (sitting and/or standing);Decreased safety awareness;Decreased coordination;Decreased cognition;Decreased range of motion;Cardiopulmonary status limiting activity;Pain;Increased edema;Impaired UE functional use      OT Treatment/Interventions: Self-care/ADL training;Therapeutic exercise;Neuromuscular education;Energy conservation;DME and/or AE instruction;Therapeutic activities;Cognitive remediation/compensation;Balance training;Patient/family education    OT Goals(Current goals can be found in the care plan section) Acute Rehab OT Goals Patient Stated Goal: to go home once independent OT Goal Formulation: With patient Time For Goal Achievement: 07/14/21 Potential to Achieve Goals: Good ADL Goals Pt Will Perform Eating: with set-up;with adaptive utensils;sitting Pt Will Perform Grooming: with supervision;sitting;with adaptive equipment Pt Will Transfer to Toilet: with min guard assist;stand pivot transfer;bedside commode Pt/caregiver will Perform Home Exercise Program: Increased ROM;Increased strength;Both right and left upper extremity;With theraputty;With written HEP  provided;With Supervision  OT Frequency:  Min 2X/week    Co-evaluation              AM-PAC OT "6 Clicks" Daily Activity     Outcome Measure Help from another person eating meals?: A Little Help from another person taking care of personal grooming?: A Little Help from another person toileting, which includes using toliet, bedpan, or urinal?: A Lot Help from another person bathing (including washing, rinsing, drying)?: A Lot Help from another person to put on and taking off regular upper body clothing?: A Lot Help from another person to put on and taking off regular lower body clothing?: A Lot 6 Click Score: 14   End of Session Equipment Utilized During Treatment: Gait belt;Cervical collar;Rolling walker (2 wheels) Nurse Communication: Mobility status  Activity Tolerance: Patient tolerated treatment well;Patient limited by pain Patient left: in bed;with bed alarm set;with call bell/phone within reach  OT Visit Diagnosis: Unsteadiness on feet (R26.81);Muscle weakness (generalized) (M62.81);Ataxia, unspecified (R27.0);Feeding difficulties (R63.3)                Time: [id number] OT Time Calculation (min): 34 min Charges:  OT General Charges $OT Visit: 1 Visit OT Evaluation $OT Eval Moderate Complexity: 1 Mod  Jefferey Pica, OTR/L Office: [place] 06/30/2021, 10:00 AM

## 2021-06-30 NOTE — Progress Notes (Signed)
Patient to transfer to 5N31. Report given to receiving RN. Patient in no signs of distress at this time. Pain medication given prior to transfer to unit. Will follow up.

## 2021-07-01 NOTE — Plan of Care (Signed)
  Problem: Education: Goal: Ability to verbalize activity precautions or restrictions will improve Outcome: Progressing Goal: Knowledge of the prescribed therapeutic regimen will improve Outcome: Progressing Goal: Understanding of discharge needs will improve Outcome: Progressing   Problem: Activity: Goal: Ability to avoid complications of mobility impairment will improve Outcome: Progressing   

## 2021-07-01 NOTE — Progress Notes (Addendum)
Patient is having difficulty swallowing thin liquids. MD made aware and advised on giving patient thickened liquid until patient is seen by speech. Speech consult made. Will continue to monitor patient.  2340: Rapid [facility] came to assess patient. Was told patient's site looks good. Will continue to monitor patient.

## 2021-07-01 NOTE — Progress Notes (Signed)
Patient slowly progressing.  With a little bit more neck pain today.  Still with some dysesthetic pain and weakness in both hands.  Lower extremity strength and function remain improved.  Patient does not feel ready for discharge home.  Wound clean and dry.  Motor and sensory function stable.  Progressing reasonably well following multilevel anterior cervical decompression and fusion surgery.  Continue efforts at therapy.  Patient potentially wanting to revisit the question of inpatient rehabilitation depending on how he is doing tomorrow.

## 2021-07-01 NOTE — Plan of Care (Signed)
°  Problem: Education: Goal: Ability to verbalize activity precautions or restrictions will improve Outcome: Progressing   Problem: Skin Integrity: Goal: Will show signs of wound healing Outcome: Progressing   Problem: Safety: Goal: Ability to remain free from injury will improve Outcome: Progressing   Problem: Pain Management: Goal: Pain level will decrease Outcome: Not Progressing

## 2021-07-01 NOTE — Progress Notes (Signed)
Physical Therapy Treatment Patient Details Name: Brian [name] MRN: [id number] DOB: Feb 15, 1961 Today's Date: 07/01/2021   History of Present Illness 61 y/o admitted 06/29/21 following C3-7 ACDF after progressive cervical myelopathy symptoms with subsequent fall and severe incomplete SCI. No significant PMH.    PT Comments    The pt was eager to participate in session with focus on progressing both ambulation and transfer ability. The pt initially required modA to complete sit-stand transfer due to poor anterior wt shift and power through BLE, but improved to minA with repeated trials even with progressively lowered bed. The pt also completed x2 walking trials with noted improvement in step length, width, and clearance. Session limited by elevated HR with activity, will continue to progress as able but continue to strongly recommend acute inpatient rehab after d/c as pt hopeful to return to full independence.     Recommendations for follow up therapy are one component of a multi-disciplinary discharge planning process, led by the attending physician.  Recommendations may be updated based on patient status, additional functional criteria and insurance authorization.  Follow Up Recommendations  Acute inpatient rehab (3hours/day)     Assistance Recommended at Discharge Frequent or constant Supervision/Assistance  Patient can return home with the following A little help with walking and/or transfers;A little help with bathing/dressing/bathroom;Assistance with cooking/housework;Assistance with feeding;Assist for transportation;Help with stairs or ramp for entrance   Equipment Recommendations  Rolling walker (2 wheels)    Recommendations for Other Services       Precautions / Restrictions Precautions Precautions: Cervical;Fall Precaution Booklet Issued: Yes (comment) Required Braces or Orthoses: Cervical Brace Cervical Brace: Soft collar;At all times Restrictions Weight Bearing  Restrictions: No     Mobility  Bed Mobility Overal bed mobility: Needs Assistance Bed Mobility: Rolling, Sidelying to Sit Rolling: Min assist Sidelying to sit: Min assist       General bed mobility comments: minA with max cues and physical assist to complete log roll    Transfers Overall transfer level: Needs assistance Equipment used: Rolling walker (2 wheels) Transfers: Sit to/from Stand Sit to Stand: Mod assist, Min guard           General transfer comment: completed x15 through session progressing from multiple reps at elevated surface and then progressively lowered surface. pt cued to try with BUE on his knees to increase LE exercise    Ambulation/Gait Ambulation/Gait assistance: Min assist, Mod assist Gait Distance (Feet): 15 Feet (x2) Assistive device: Rolling walker (2 wheels), 2 person hand held assist, 1 person hand held assist, None Gait Pattern/deviations: Ataxic, Decreased stride length, Step-through pattern, Drifts right/left Gait velocity: decreased Gait velocity interpretation: <1.31 ft/sec, indicative of household ambulator   General Gait Details: ataxic gait pattern (L>R). Initially started with frequent scissoring, but after cue to maintain feet with space between them the pt was able to adopt this cue and had no further instances. also making corrections with RW positioning without cues. but needing up to modA to correct LOB       Balance Overall balance assessment: Needs assistance Sitting-balance support: No upper extremity supported, Feet supported Sitting balance-Leahy Scale: Fair     Standing balance support: No upper extremity supported, During functional activity Standing balance-Leahy Scale: Poor Standing balance comment: min-modA to maintain balance during ambulation                            Cognition Arousal/Alertness: Awake/alert Behavior During Therapy: Flat affect Overall Cognitive  Status: Within Functional Limits  for tasks assessed                                 General Comments: pt with flat affect but able to follow all cues        Exercises General Exercises - Lower Extremity Long Arc Quad: AROM, Both, 10 reps, Seated Hip Flexion/Marching: AROM, Both, 10 reps, Seated Toe Raises: AROM, Both, 10 reps, Seated Heel Raises: AROM, Both, 10 reps, Seated Other Exercises Other Exercises: standing marches with toe tap to 4 inch step Other Exercises: repeated sit-stand from progressively lower surface without UE support. x3 in each position    General Comments General comments (skin integrity, edema, etc.): Hr to max 150 with activity      Pertinent Vitals/Pain Pain Assessment Pain Assessment: Faces Faces Pain Scale: Hurts even more Pain Location: neck, shoulders Pain Descriptors / Indicators: Grimacing, Guarding Pain Intervention(s): Limited activity within patient's tolerance, Monitored during session, Premedicated before session, Repositioned           PT Goals (current goals can now be found in the care plan section) Acute Rehab PT Goals Patient Stated Goal: to get better and be independent PT Goal Formulation: With patient Time For Goal Achievement: 07/14/21 Potential to Achieve Goals: Good Progress towards PT goals: Progressing toward goals    Frequency    Min 5X/week      PT Plan Current plan remains appropriate       AM-PAC PT "6 Clicks" Mobility   Outcome Measure  Help needed turning from your back to your side while in a flat bed without using bedrails?: A Little Help needed moving from lying on your back to sitting on the side of a flat bed without using bedrails?: A Little Help needed moving to and from a bed to a chair (including a wheelchair)?: A Little Help needed standing up from a chair using your arms (e.g., wheelchair or bedside chair)?: A Little Help needed to walk in hospital room?: A Lot Help needed climbing 3-5 steps with a railing? :  Total 6 Click Score: 15    End of Session Equipment Utilized During Treatment: Gait belt;Cervical collar Activity Tolerance: Patient tolerated treatment well Patient left: with call bell/phone within reach;in chair Nurse Communication: Mobility status PT Visit Diagnosis: Unsteadiness on feet (R26.81);History of falling (Z91.81);Ataxic gait (R26.0);Muscle weakness (generalized) (M62.81);Other symptoms and signs involving the nervous system (R29.898);Difficulty in walking, not elsewhere classified (R26.2)     Time: 9371-6967 PT Time Calculation (min) (ACUTE ONLY): 46 min  Charges:  $Therapeutic Exercise: 23-37 mins $Therapeutic Activity: 8-22 mins                     Vickki Muff, PT, DPT   Acute Rehabilitation Department Pager #: [phone]   Ronnie Derby 07/01/2021, 6:36 PM

## 2021-07-01 NOTE — Significant Event (Signed)
Rapid Response Event Note   Reason for Call :  Second set of eyes for pt who is POD 2 s/p ACDF surgery. Pt RN, pt has been c/o difficulty swallowing. Dr. Annette Stable was notified and placed pt on thickened liquids and ordered SLP swallow eval.  Initial Focused Assessment:  Pt lying in bed with eyes open, alert and oriented, in no distress. He says he has been having difficulty swallowing since his surgery on Friday. He says this has worsened today. Pt's surgical site WNL. Pt c/o some pain around site. Pt denies SOB at this time. His lungs are clear t/o. Skin warm and dry.   HR-116, BP-124/96, RR-18, SpO2-91% on RA   Interventions:  No RRT interventions needed at this time. Thickened liquids and SLP swallow evaluation ordered by MD PTA RRT.   Plan of Care:  Pt's site WNL. He is in no distress. VSS. Continue to monitor pt closely. Call RRT if further assistance needed.   Event Summary:   MD Notified: Dr. Annette Stable notified PTA RRT Call Time:2251 Arrival [phone] End Time:2350  Dillard Essex, RN

## 2021-07-02 ENCOUNTER — Inpatient Hospital Stay (HOSPITAL_COMMUNITY): Payer: 59

## 2021-07-02 LAB — BASIC METABOLIC PANEL
Anion gap: 9 (ref 5–15)
BUN: 15 mg/dL (ref 6–20)
CO2: 24 mmol/L (ref 22–32)
Calcium: 9.6 mg/dL (ref 8.9–10.3)
Chloride: 100 mmol/L (ref 98–111)
Creatinine, Ser: 0.88 mg/dL (ref 0.61–1.24)
GFR, Estimated: >60 mL/min (ref >60)
Glucose, Bld: 97 mg/dL (ref 70–99)
Potassium: 4.5 mmol/L (ref 3.5–5.1)
Sodium: 133 mmol/L — ABNORMAL LOW (ref 135–145)

## 2021-07-02 LAB — CBC WITH DIFFERENTIAL/PLATELET
Abs Immature Granulocytes: 0.05 10*3/uL (ref 0.00–0.07)
Basophils Absolute: 0.1 10*3/uL (ref 0.0–0.1)
Basophils Relative: 0 %
Eosinophils Absolute: 0.1 10*3/uL (ref 0.0–0.5)
Eosinophils Relative: 1 %
HCT: 32.6 % — ABNORMAL LOW (ref 39.0–52.0)
Hemoglobin: 11 g/dL — ABNORMAL LOW (ref 13.0–17.0)
Immature Granulocytes: 0 %
Lymphocytes Relative: 14 %
Lymphs Abs: 2.3 10*3/uL (ref 0.7–4.0)
MCH: 34.3 pg — ABNORMAL HIGH (ref 26.0–34.0)
MCHC: 33.7 g/dL (ref 30.0–36.0)
MCV: 101.6 fL — ABNORMAL HIGH (ref 80.0–100.0)
Monocytes Absolute: 2.6 10*3/uL — ABNORMAL HIGH (ref 0.1–1.0)
Monocytes Relative: 16 %
Neutro Abs: 11.2 10*3/uL — ABNORMAL HIGH (ref 1.7–7.7)
Neutrophils Relative %: 69 %
Platelets: 189 10*3/uL (ref 150–400)
RBC: 3.21 MIL/uL — ABNORMAL LOW (ref 4.22–5.81)
RDW: 14.3 % (ref 11.5–15.5)
WBC: 16.3 10*3/uL — ABNORMAL HIGH (ref 4.0–10.5)
nRBC: 0 % (ref 0.0–0.2)

## 2021-07-02 IMAGING — DX DG CHEST 1V PORT
1 series · 1 of 1 positions shown · non-contrast
Comparison: None.

CLINICAL DATA: Fever

EXAM:
PORTABLE CHEST 1 VIEW

[chest]
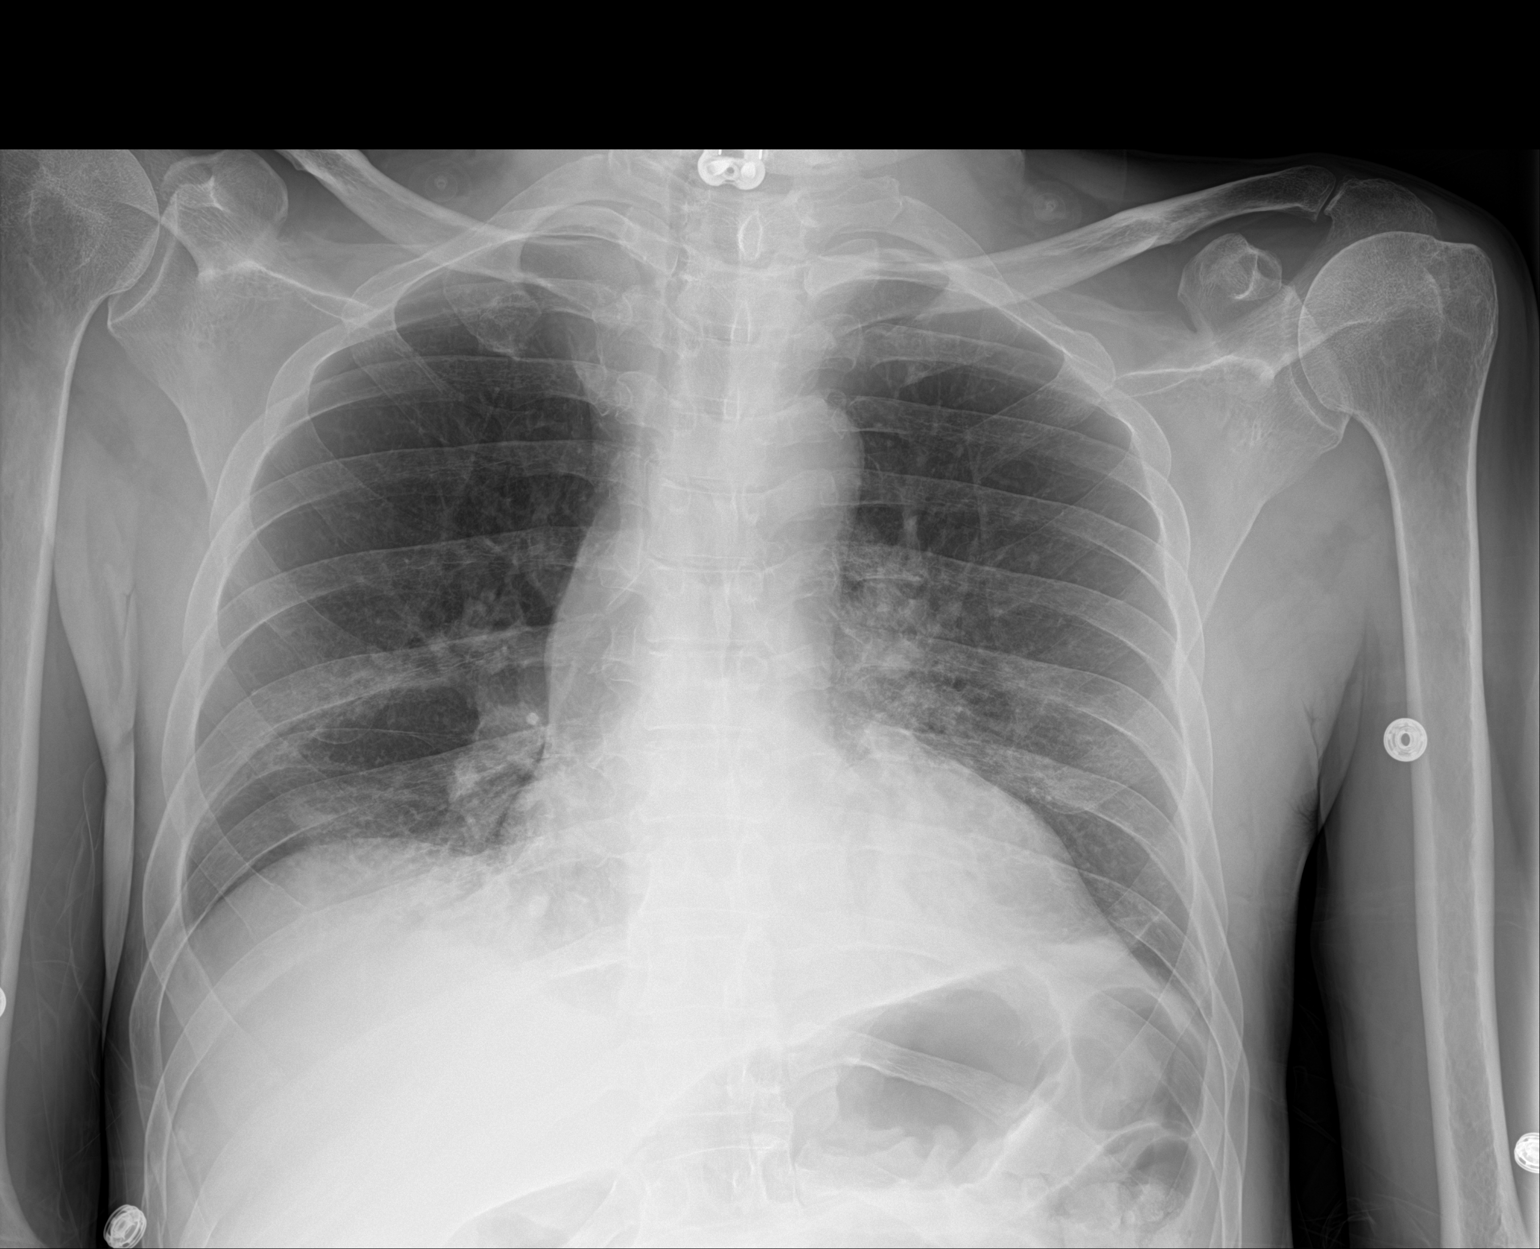

[1 of 1 positions shown; findings below may reference images not displayed]

FINDINGS: Cardiac and mediastinal contours within normal limits. Mild right
basilar and left perihilar opacities. No large pleural effusion or
pneumothorax.
IMPRESSION: Mild right basilar and left perihilar opacities, possibly due to
atelectasis, although infection or aspiration could appear similar.

## 2021-07-02 MED ORDER — SODIUM CHLORIDE 0.9 % IV SOLN: INTRAVENOUS | Status: AC

## 2021-07-02 MED ORDER — SENNOSIDES-DOCUSATE SODIUM 8.6-50 MG PO TABS
1.0000 | ORAL_TABLET | Freq: Two times a day (BID) | ORAL | Status: DC
Start: 2021-07-02 — End: 2021-07-04
  Administered 2021-07-02: 1 via ORAL
  Filled 2021-07-02 (×2): qty 1

## 2021-07-02 MED ORDER — BISACODYL 10 MG RE SUPP
10.0000 mg | Freq: Every day | RECTAL | Status: DC | PRN
  Administered 2021-07-02 – 2021-07-17 (×3): 10 mg via RECTAL
  Filled 2021-07-02 (×4): qty 1

## 2021-07-02 MED ORDER — FLEET ENEMA 7-19 GM/118ML RE ENEM
1.0000 | ENEMA | Freq: Every day | RECTAL | Status: DC | PRN
  Administered 2021-07-17: 1 via RECTAL
  Filled 2021-07-02 (×2): qty 1

## 2021-07-02 NOTE — Progress Notes (Signed)
Inpatient Rehab Admissions Coordinator:   I spoke with pt. And he is now interested in  CIR. Need for support at d/c was discussed and pt. States he will reach out to friends and family. I will open a case with his insurance once updated PT/OT notes are entered.   Megan Salon, MS, CCC-SLP Rehab Admissions Coordinator  [phone] (celll) [phone] (office)

## 2021-07-02 NOTE — Plan of Care (Signed)
°  Problem: Education: Goal: Ability to verbalize activity precautions or restrictions will improve Outcome: Progressing   Problem: Pain Management: Goal: Pain level will decrease Outcome: Progressing   Problem: Skin Integrity: Goal: Will show signs of wound healing Outcome: Progressing   Problem: Safety: Goal: Ability to remain free from injury will improve Outcome: Progressing

## 2021-07-02 NOTE — Progress Notes (Signed)
Occupational Therapy Treatment Patient Details Name: Brian [name] MRN: [id number] DOB: December 19, 1960 Today's Date: 07/02/2021   History of present illness 61 y/o admitted 06/29/21 following C3-7 ACDF after progressive cervical myelopathy symptoms with subsequent fall and severe incomplete SCI. No significant PMH.   OT comments  Patient received in bed and agreeable to perform OT.  Patient required mod assist to get to EOB and max verbal cues due to complaints of pain. Patient stood from EOB to RW with difficulty holding onto RW and appeared unsafe. Face to face stand pivot transfer performed to recliner and therapist realized patient had soiled bed. Patient stood from recliner for cleaning and asked to use BSC. Patient was mod assist to transfer to [facility] and required assistance for standing for hygiene and assistance of another for balance. Patient appropriate for AIR to increase independence with bed mobility, transfers, and self care.    Recommendations for follow up therapy are one component of a multi-disciplinary discharge planning process, led by the attending physician.  Recommendations may be updated based on patient status, additional functional criteria and insurance authorization.    Follow Up Recommendations  Acute inpatient rehab (3hours/day)    Assistance Recommended at Discharge Frequent or constant Supervision/Assistance  Patient can return home with the following  A lot of help with bathing/dressing/bathroom;A lot of help with walking and/or transfers   Equipment Recommendations  BSC/3in1    Recommendations for Other Services      Precautions / Restrictions Precautions Precautions: Cervical;Fall Precaution Booklet Issued: Yes (comment) Required Braces or Orthoses: Cervical Brace Cervical Brace: Soft collar;At all times       Mobility Bed Mobility Overal bed mobility: Needs Assistance Bed Mobility: Rolling, Sidelying to Sit Rolling: Min assist Sidelying to sit: Mod  assist       General bed mobility comments: mod assist and max cues due to pain    Transfers Overall transfer level: Needs assistance Equipment used: None Transfers: Sit to/from Stand, Bed to chair/wheelchair/BSC Sit to Stand: Mod assist Stand pivot transfers: Mod assist         General transfer comment: patient had difficulty holding onto walker and transfer performed with face to face technique for safety     Balance Overall balance assessment: Needs assistance Sitting-balance support: No upper extremity supported, Feet supported Sitting balance-Leahy Scale: Fair Sitting balance - Comments: min guard for safety   Standing balance support: Bilateral upper extremity supported, During functional activity Standing balance-Leahy Scale: Poor Standing balance comment: mod assist for balance during standing and transfers                           ADL either performed or assessed with clinical judgement   ADL Overall ADL's : Needs assistance/impaired     Grooming: Minimal assistance;Sitting Grooming Details (indicate cue type and reason): min assist to wash face                 Toilet Transfer: Stand-pivot;Moderate assistance Toilet Transfer Details (indicate cue type and reason): patient unsteady with RW and performed Stand pivot with face to face technique for safety Toileting- Clothing Manipulation and Hygiene: Maximal assistance;Sit to/from stand Toileting - Clothing Manipulation Details (indicate cue type and reason): assistance of one for standing and assistance of another for hygiene       General ADL Comments: patient had difficulty holding onto walker when standing    Extremity/Trunk Assessment Upper Extremity Assessment RUE Deficits / Details: shoulder 2-/5 strength;  elbow 2-/5 strength, wrist and hand 2 to 2-/5; digits 2-/5 RUE Sensation: WNL RUE Coordination: decreased fine motor;decreased gross motor LUE Deficits / Details: shoulder 2-/5  strength; elbow 2-/5 strength, wrist and hand 2 to 2-/5; digits 2-/5 LUE Sensation: WNL LUE Coordination: decreased fine motor;decreased gross motor            Vision       Perception     Praxis      Cognition Arousal/Alertness: Awake/alert Behavior During Therapy: Flat affect Overall Cognitive Status: Within Functional Limits for tasks assessed                                 General Comments: followed directions.  Patient appeared fearful of falling. Asked if he could remove soft collar, explained benefits of wearing collar        Exercises      Shoulder Instructions       General Comments      Pertinent Vitals/ Pain       Pain Assessment Pain Assessment: Faces Faces Pain Scale: Hurts even more Pain Location: Neck Pain Descriptors / Indicators: Grimacing, Sharp Pain Intervention(s): Limited activity within patient's tolerance, Monitored during session, Repositioned  Home Living                                          Prior Functioning/Environment              Frequency  Min 2X/week        Progress Toward Goals  OT Goals(current goals can now be found in the care plan section)  Progress towards OT goals: Progressing toward goals  Acute Rehab OT Goals Patient Stated Goal: get better OT Goal Formulation: With patient Time For Goal Achievement: 07/14/21 Potential to Achieve Goals: Good ADL Goals Pt Will Perform Eating: with set-up;with adaptive utensils;sitting Pt Will Perform Grooming: with supervision;sitting;with adaptive equipment Pt Will Transfer to Toilet: with min guard assist;stand pivot transfer;bedside commode Pt/caregiver will Perform Home Exercise Program: Increased ROM;Increased strength;Both right and left upper extremity;With theraputty;With written HEP provided;With Supervision  Plan Discharge plan remains appropriate    Co-evaluation                 AM-PAC OT "6 Clicks" Daily Activity      Outcome Measure   Help from another person eating meals?: A Little Help from another person taking care of personal grooming?: A Little Help from another person toileting, which includes using toliet, bedpan, or urinal?: A Lot Help from another person bathing (including washing, rinsing, drying)?: A Lot Help from another person to put on and taking off regular upper body clothing?: A Lot Help from another person to put on and taking off regular lower body clothing?: A Lot 6 Click Score: 14    End of Session Equipment Utilized During Treatment: Gait belt;Cervical collar;Rolling walker (2 wheels)  OT Visit Diagnosis: Unsteadiness on feet (R26.81);Muscle weakness (generalized) (M62.81);Ataxia, unspecified (R27.0);Feeding difficulties (R63.3)   Activity Tolerance Patient tolerated treatment well;Patient limited by pain   Patient Left in chair;with call bell/phone within reach;with chair alarm set   Nurse Communication Mobility status        Time: 1350-1446 OT Time Calculation (min): 56 min  Charges: OT General Charges $OT Visit: 1 Visit OT Treatments $Self Care/Home Management : 53-67 mins  Alfonse Flavors, OTA Acute Rehabilitation Services  Pager [phone] Office [phone]   Dewain Penning 07/02/2021, 3:07 PM

## 2021-07-02 NOTE — Progress Notes (Signed)
°   07/02/21 1722  Assess: MEWS Score  Temp (!) 101.2 F (38.4 C)  BP 138/84  Pulse Rate (!) 130  Resp 18  Level of Consciousness Alert  SpO2 96 %  O2 Device Room Air  Assess: MEWS Score  MEWS Temp 1  MEWS Systolic 0  MEWS Pulse 3  MEWS RR 0  MEWS LOC 0  MEWS Score 4  MEWS Score Color Red  Assess: if the MEWS score is Yellow or Red  Were vital signs taken at a resting state? Yes  Focused Assessment No change from prior assessment  Early Detection of Sepsis Score *See Row Information* High  MEWS guidelines implemented *See Row Information* No, other (Comment)  Treat  MEWS Interventions Administered prn meds/treatments  Notify: Charge Nurse/RN  Name of Charge Nurse/RN Notified Lauren,RN  Date Charge Nurse/RN Notified 07/02/21  Time Charge Nurse/RN Notified 1730  Notify: Provider  Provider Name/Title Kimberly,MD  Date Provider Notified 07/02/21  Time Provider Notified 1755  Notification Type Page  Notification Reason Change in status;Other (Comment) (Red MEWS)  Provider response Other (Comment);No new orders (Use of incentive Spirometer)  Date of Provider Response 07/02/21  Time of Provider Response 1803  Notify: Rapid Response  Date Rapid Response Notified 07/02/21  Time Rapid Response Notified 1734   Patient given PRN medication for pain and tempeture. Consulting civil engineer notified. [facility] paged and talked to [place] from neurosurgery and informed about patient's Red MEWS. Informed that PRN meds have been administered. Verbal order for incentive spirometer every hour when pt awake received. Will follow the order and continue to monitor.

## 2021-07-02 NOTE — Evaluation (Cosign Needed)
Clinical/Bedside Swallow Evaluation Patient Details  Name: Brian [name] MRN: [id number] Date of Birth: [birth year]  Today's Date: 07/02/2021 Time: SLP Start Time (ACUTE ONLY): 1030 SLP Stop Time (ACUTE ONLY): 1045 SLP Time Calculation (min) (ACUTE ONLY): 15 min  Past Medical History: History reviewed. No pertinent past medical history. Past Surgical History: History reviewed. No pertinent surgical history. HPI:  61 y/o admitted 06/29/21 following C3-7 ACDF after progressive cervical myelopathy symptoms with subsequent fall and severe incomplete SCI. No significant PMH.    Assessment / Plan / Recommendation  Clinical Impression  Pt was seen for clinical bedside swallow evaluation. He was alert and cooperative during the evaluation. Pt verbalized discomfort and pain at baseline that is exacerbated when eating/drinking. Pt was presented with sips of water via straw cup and puree applesauce. Pt required max assist (hand-over-hand) with SLP to facilitate self-feeding. Pt did not toelrate individual sips of thin liquids; immediate throat clearing, wet vocal quality and multiple audible swallows noted. These were also noted during puree trials. Pt verbalized that the puree and thin liquids felt the same in his throat and one texture was not easier/better than the other. SLP recommends performing MBS as diagnostic measure to evaluate pharyngeal phase of swallowing. SLP Visit Diagnosis: Dysphagia, unspecified (R13.10)    Aspiration Risk  Moderate aspiration risk    Diet Recommendation     Medication Administration: Crushed with puree Supervision: Staff to assist with self feeding Compensations: Small sips/bites Postural Changes: Seated upright at 90 degrees    Other  Recommendations Oral Care Recommendations: Oral care BID Other Recommendations: Have oral suction available    Recommendations for follow up therapy are one component of a multi-disciplinary discharge planning process, led by the  attending physician.  Recommendations may be updated based on patient status, additional functional criteria and insurance authorization.  Follow up Recommendations Acute inpatient rehab (3hours/day)      Assistance Recommended at Discharge Set up Supervision/Assistance  Functional Status Assessment Patient has had a recent decline in their functional status and demonstrates the ability to make significant improvements in function in a reasonable and predictable amount of time.  Frequency and Duration min 2x/week  2 weeks       Prognosis Prognosis for Safe Diet Advancement: Good      Swallow Study   General HPI: 61 y/o admitted 06/29/21 following C3-7 ACDF after progressive cervical myelopathy symptoms with subsequent fall and severe incomplete SCI. No significant PMH. Type of Study: Bedside Swallow Evaluation Previous Swallow Assessment: none Diet Prior to this Study: Regular;Thin liquids Temperature Spikes Noted: No Respiratory Status: Room air History of Recent Intubation: Yes Behavior/Cognition: Alert;Cooperative;Pleasant mood Oral Cavity Assessment: Within Functional Limits Oral Care Completed by SLP: Yes Oral Cavity - Dentition: Poor condition Vision: Functional for self-feeding Self-Feeding Abilities: Needs assist Patient Positioning: Upright in bed Baseline Vocal Quality: Normal Volitional Cough: Strong Volitional Swallow: Able to elicit    Oral/Motor/Sensory Function Overall Oral Motor/Sensory Function: Within functional limits   Ice Chips Ice chips: Not tested   Thin Liquid Thin Liquid: Impaired Presentation: Straw Oral Phase Functional Implications: Oral holding Pharyngeal  Phase Impairments: Multiple swallows;Wet Vocal Quality;Throat Clearing - Immediate    Nectar Thick Nectar Thick Liquid: Not tested   Honey Thick Honey Thick Liquid: Not tested   Puree Puree: Impaired Presentation: Spoon Pharyngeal Phase Impairments: Multiple swallows;Wet Vocal Quality;Throat  Clearing - Immediate   Solid     Solid: Not tested      Ezekiel Slocumb 07/02/2021,11:02 AM

## 2021-07-02 NOTE — Progress Notes (Signed)
Pt remains on Yellow MEWS due to tachycardia 125 bpm. MD on call (Dr. Albin Felling) was notified and ordered CBC and BMP verbally with reading back.

## 2021-07-02 NOTE — Progress Notes (Signed)
° °  Providing Compassionate, Quality Care - Together   Subjective: Patient reports his pain is much improved today. Tachycardic with low grade fever overnight. Poor PO intake. Patient with no BM for three days. He is reconsidering CIR.  Objective: Vital signs in last 24 hours: Temp:  [98 F (36.7 C)-99.8 F (37.7 C)] 99.6 F (37.6 C) (02/06 0758) Pulse Rate:  [90-129] 127 (02/06 0758) Resp:  [18-20] 19 (02/06 0758) BP: (121-142)/(82-97) 121/82 (02/06 0758) SpO2:  [91 %-100 %] 93 % (02/06 0758)  Intake/Output from previous day: 02/05 0701 - 02/06 0700 In: 360 [P.O.:360] Out: -  Intake/Output this shift: No intake/output data recorded.  Alert and oriented x 4 PERRLA Voice hoarse Chest rhonchi CN II-XII grossly intact MAE, Decreased strength and fine motor BUE; dysesthetic pain in both hands Incision is covered with Honeycomb dressing and Steri Strips; Dressing is clean, dry, and intact Soft collar   Lab Results: Recent Labs    07/02/21 0515  WBC 16.3*  HGB 11.0*  HCT 32.6*  PLT 189   BMET Recent Labs    07/02/21 0515  NA 133*  K 4.5  CL 100  CO2 24  GLUCOSE 97  BUN 15  CREATININE 0.88  CALCIUM 9.6    Studies/Results: No results found.  Assessment/Plan: Patient status post C3-4, C4-5, C5-6, C6-7 anterior cervical discectomy with interbody fusion by Dr. Annette Stable on 06/29/2021. Patient is gradually improving. Reconsidering CIR.   LOS: 3 days   -IV fluids added due to poor PO intake -CXR ordered -Laxative/stool softener added  Viona Gilmore, DNP, AGNP-C Nurse Practitioner  [facility] [place] [address], [address], [place], [place] [address] P: [phone]     F: [phone]  07/02/2021, 8:34 AM

## 2021-07-02 NOTE — Progress Notes (Signed)
Physical Therapy Treatment Patient Details Name: Brian [name] MRN: [id number] DOB: May 05, 1961 Today's Date: 07/02/2021   History of Present Illness Pt is a 61 y/o male admitted 06/29/21 following C3-7 ACDF after progressive cervical myelopathy symptoms with subsequent fall and severe incomplete SCI. No significant PMH.    PT Comments    Pt progressing slowly towards physical therapy goals. When PT entered room pt was holding a mouthful of secretions he did not feel he could swallow and needed to spit before he could speak with therapist. Pt malaligned in the chair and required +2 assist for transition back to bed due to pain and weakness/fatigue from being up. Stedy utilized for safety. Will continue to follow and progress as able per POC.     Recommendations for follow up therapy are one component of a multi-disciplinary discharge planning process, led by the attending physician.  Recommendations may be updated based on patient status, additional functional criteria and insurance authorization.  Follow Up Recommendations  Acute inpatient rehab (3hours/day)     Assistance Recommended at Discharge Frequent or constant Supervision/Assistance  Patient can return home with the following Assistance with cooking/housework;Assistance with feeding;Assist for transportation;Help with stairs or ramp for entrance;Two people to help with walking and/or transfers   Equipment Recommendations  Rolling walker (2 wheels)    Recommendations for Other Services Rehab consult     Precautions / Restrictions Precautions Precautions: Cervical;Fall Precaution Booklet Issued: Yes (comment) Precaution Comments: Verbally reviewed precautions during functional mobility. Required Braces or Orthoses: Cervical Brace Cervical Brace: Soft collar;At all times Restrictions Weight Bearing Restrictions: No     Mobility  Bed Mobility Overal bed mobility: Needs Assistance Bed Mobility: Rolling, Sit to  Sidelying Rolling: Min assist       Sit to sidelying: Mod assist, +2 for physical assistance General bed mobility comments: +2 assist required due to pain    Transfers Overall transfer level: Needs assistance Equipment used: Ambulation equipment used Transfers: Sit to/from Stand, Bed to chair/wheelchair/BSC Sit to Stand: Mod assist, +2 physical assistance           General transfer comment: +2 assist required to elevate trunk to full sitting position. We opted for the [facility] for back to bed due to increased pain and inability to initiate stand without +2. Transfer via Lift Equipment: Stedy  Ambulation/Gait               General Gait Details: Unable to attempt this session due to pain/weakness after being in the chair.   Stairs             Wheelchair Mobility    Modified Rankin (Stroke Patients Only)       Balance Overall balance assessment: Needs assistance Sitting-balance support: No upper extremity supported, Feet supported Sitting balance-Leahy Scale: Poor Sitting balance - Comments: posterior lean and jerky truncal control   Standing balance support: Bilateral upper extremity supported, During functional activity Standing balance-Leahy Scale: Poor Standing balance comment: mod assist for balance during standing and transfers                            Cognition Arousal/Alertness: Awake/alert Behavior During Therapy: Flat affect Overall Cognitive Status: Within Functional Limits for tasks assessed  Exercises      General Comments        Pertinent Vitals/Pain Pain Assessment Pain Assessment: Faces Faces Pain Scale: Hurts whole lot Breathing: normal Pain Location: Neck Pain Descriptors / Indicators: Grimacing, Sharp, Spasm Pain Intervention(s): Limited activity within patient's tolerance, Monitored during session, Repositioned    Home Living                           Prior Function            PT Goals (current goals can now be found in the care plan section) Acute Rehab PT Goals Patient Stated Goal: to get better and be independent, back to PLOF PT Goal Formulation: With patient Time For Goal Achievement: 07/14/21 Potential to Achieve Goals: Good Progress towards PT goals: Not progressing toward goals - comment (Required increased assist this session)    Frequency    Min 5X/week      PT Plan Current plan remains appropriate    Co-evaluation              AM-PAC PT "6 Clicks" Mobility   Outcome Measure  Help needed turning from your back to your side while in a flat bed without using bedrails?: A Little Help needed moving from lying on your back to sitting on the side of a flat bed without using bedrails?: Total Help needed moving to and from a bed to a chair (including a wheelchair)?: Total Help needed standing up from a chair using your arms (e.g., wheelchair or bedside chair)?: Total Help needed to walk in hospital room?: Total Help needed climbing 3-5 steps with a railing? : Total 6 Click Score: 8    End of Session Equipment Utilized During Treatment: Gait belt;Cervical collar Activity Tolerance: Patient tolerated treatment well Patient left: in bed;with call bell/phone within reach;with bed alarm set;with nursing/sitter in room Nurse Communication: Mobility status;Need for lift equipment PT Visit Diagnosis: Unsteadiness on feet (R26.81);History of falling (Z91.81);Ataxic gait (R26.0);Muscle weakness (generalized) (M62.81);Other symptoms and signs involving the nervous system (R29.898);Difficulty in walking, not elsewhere classified (R26.2)     Time: [id number] PT Time Calculation (min) (ACUTE ONLY): 33 min  Charges:  $Therapeutic Activity: 23-37 mins                     Rolinda Roan, PT, DPT Acute Rehabilitation Services Pager: [phone] Office: [phone]    Thelma Comp 07/02/2021, 4:20 PM

## 2021-07-02 NOTE — Progress Notes (Signed)
10:00 Spoke with Brian [name] this morning about a pneumonia vaccine. He qualifies since he has a positive smoking history. He does not want a pneumonia vaccine at this time. He wants to ask his friend who is a Engineer, civil (consulting). I provided him with the CDC information sheet on pneumonia vaccines and went over the information with him.  He smoked cigarettes prior to his admission for cervical surgery, but says he quit smoking 2 weeks ago because he accidentally dropped a lit cigarette and this was very concerning to him. He says he will not resume smoking when he returns home and is highly motivated to quit.  I educated him about the 1800QUITNOW number/resource if he feels like he is going to relapse, he indicated he would not relapse.

## 2021-07-03 ENCOUNTER — Inpatient Hospital Stay (HOSPITAL_COMMUNITY): Payer: 59 | Admitting: Certified Registered"

## 2021-07-03 ENCOUNTER — Other Ambulatory Visit: Payer: Self-pay

## 2021-07-03 ENCOUNTER — Inpatient Hospital Stay (HOSPITAL_COMMUNITY): Payer: 59

## 2021-07-03 ENCOUNTER — Encounter (HOSPITAL_COMMUNITY): Payer: Self-pay | Admitting: Neurosurgery

## 2021-07-03 ENCOUNTER — Encounter (HOSPITAL_COMMUNITY)
Admission: RE | Disposition: A | Discharge status: Home Health | Payer: Self-pay | Source: Home / Self Care | Attending: Neurosurgery

## 2021-07-03 DIAGNOSIS — G959 Disease of spinal cord, unspecified: Secondary | ICD-10-CM | POA: Diagnosis not present

## 2021-07-03 HISTORY — PX: HEMATOMA EVACUATION: SHX5118

## 2021-07-03 LAB — CBC
HCT: 30 % — ABNORMAL LOW (ref 39.0–52.0)
Hemoglobin: 9.9 g/dL — ABNORMAL LOW (ref 13.0–17.0)
MCH: 33.3 pg (ref 26.0–34.0)
MCHC: 33 g/dL (ref 30.0–36.0)
MCV: 101 fL — ABNORMAL HIGH (ref 80.0–100.0)
Platelets: 231 10*3/uL (ref 150–400)
RBC: 2.97 MIL/uL — ABNORMAL LOW (ref 4.22–5.81)
RDW: 14.2 % (ref 11.5–15.5)
WBC: 11.3 10*3/uL — ABNORMAL HIGH (ref 4.0–10.5)
nRBC: 0 % (ref 0.0–0.2)

## 2021-07-03 LAB — URINALYSIS, ROUTINE W REFLEX MICROSCOPIC
Bilirubin Urine: NEGATIVE
Glucose, UA: NEGATIVE mg/dL
Ketones, ur: >80 mg/dL — AB
Leukocytes,Ua: NEGATIVE
Nitrite: NEGATIVE
Protein, ur: NEGATIVE mg/dL
Specific Gravity, Urine: 1.01 (ref 1.005–1.030)
pH: 6 (ref 5.0–8.0)

## 2021-07-03 LAB — BASIC METABOLIC PANEL
Anion gap: 12 (ref 5–15)
BUN: 16 mg/dL (ref 6–20)
CO2: 21 mmol/L — ABNORMAL LOW (ref 22–32)
Calcium: 9.4 mg/dL (ref 8.9–10.3)
Chloride: 102 mmol/L (ref 98–111)
Creatinine, Ser: 0.77 mg/dL (ref 0.61–1.24)
GFR, Estimated: >60 mL/min (ref >60)
Glucose, Bld: 85 mg/dL (ref 70–99)
Potassium: 4.1 mmol/L (ref 3.5–5.1)
Sodium: 135 mmol/L (ref 135–145)

## 2021-07-03 LAB — URINALYSIS, MICROSCOPIC (REFLEX)

## 2021-07-03 LAB — GLUCOSE, CAPILLARY: Glucose-Capillary: 83 mg/dL (ref 70–99)

## 2021-07-03 LAB — MRSA NEXT GEN BY PCR, NASAL: MRSA by PCR Next Gen: NOT DETECTED

## 2021-07-03 LAB — LACTIC ACID, PLASMA
Lactic Acid, Venous: 1.3 mmol/L (ref 0.5–1.9)
Lactic Acid, Venous: 1.4 mmol/L (ref 0.5–1.9)

## 2021-07-03 IMAGING — CT CT HEAD CODE STROKE
3 of 4 series · 14 of 47 positions shown, 16 images · non-contrast
Comparison: [DATE]

CLINICAL DATA: Code stroke.



[Series 2: head 5.0 st · axial · 0.44mm/px · z∈[-58,+67]mm · 8 of 31 slices shown, 10 images]
[im 3/31  brain]
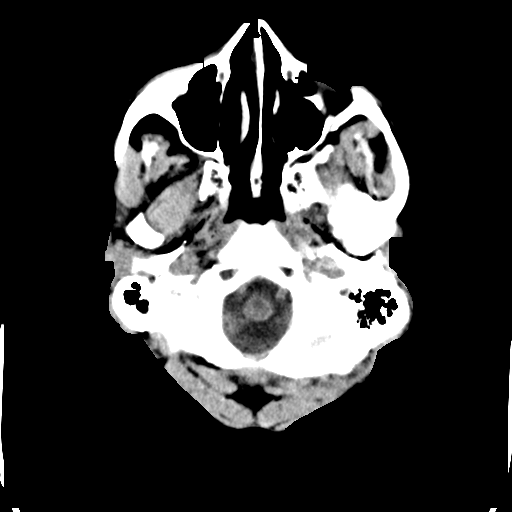
[im 3/31  bone]
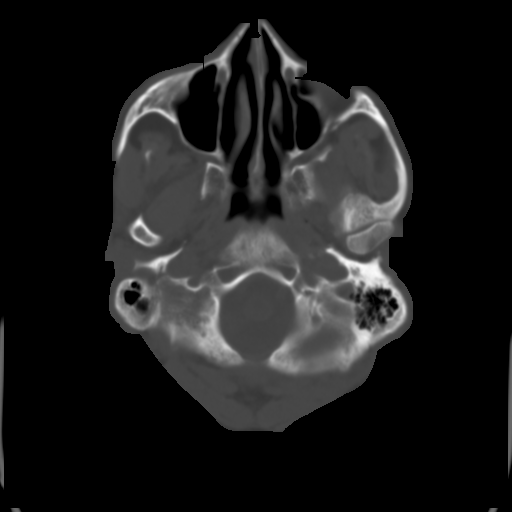
[im 7/31  brain]
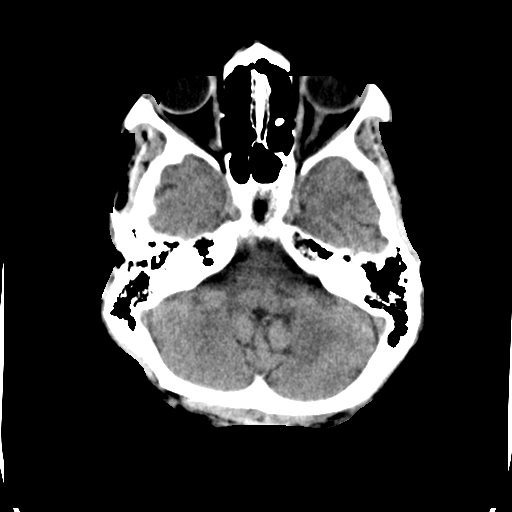
[im 11/31  brain]
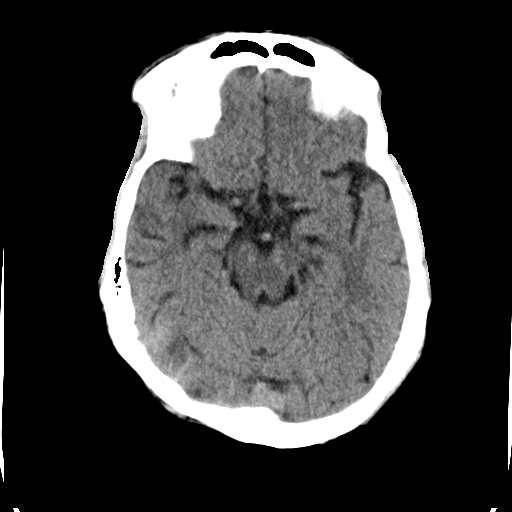
[im 14/31  brain]
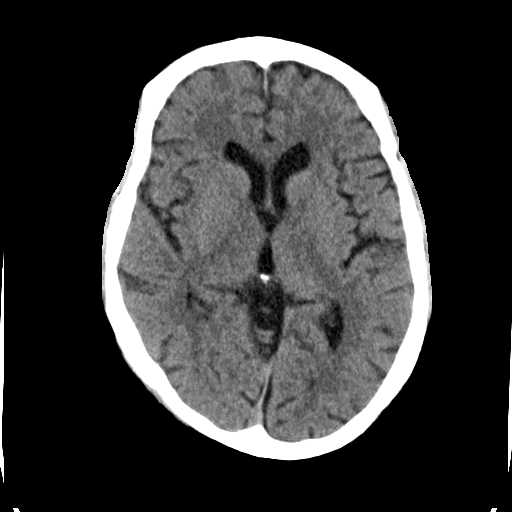
[im 17/31  brain]
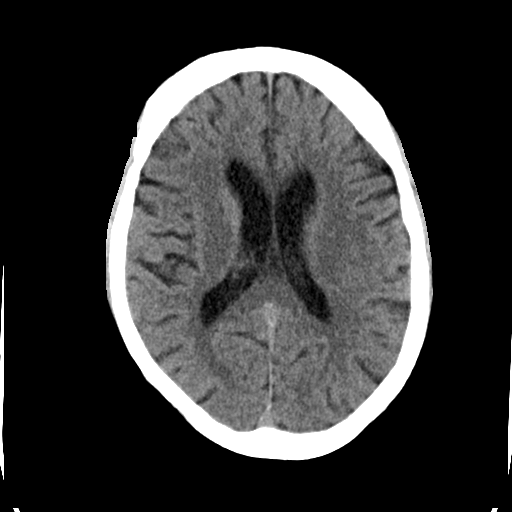
[im 17/31  bone]
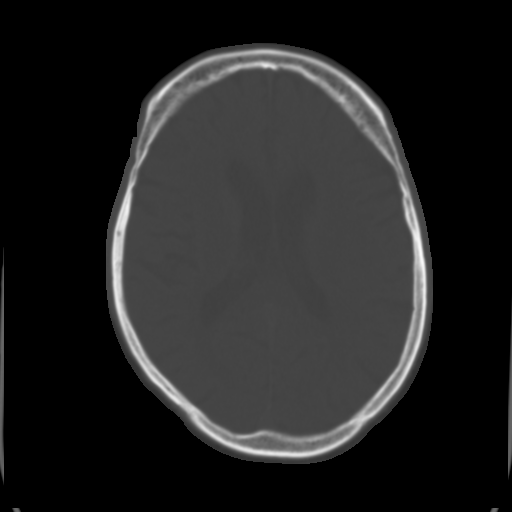
[im 21/31  brain]
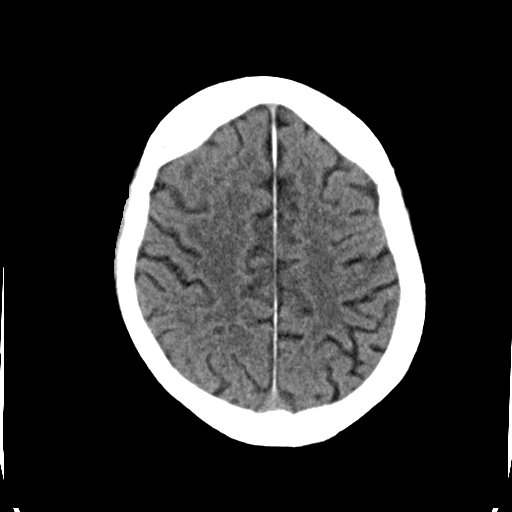
[im 24/31  brain]
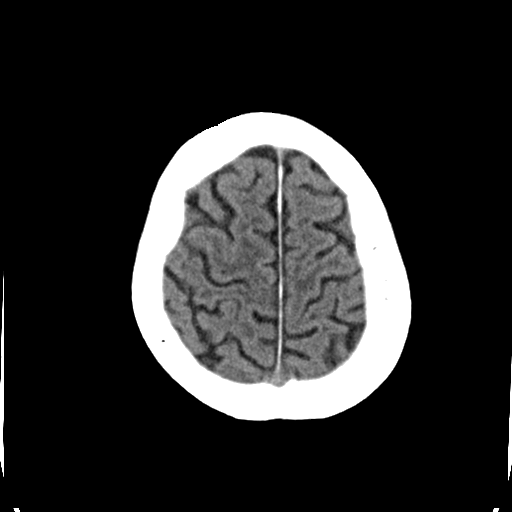
[im 28/31  brain]
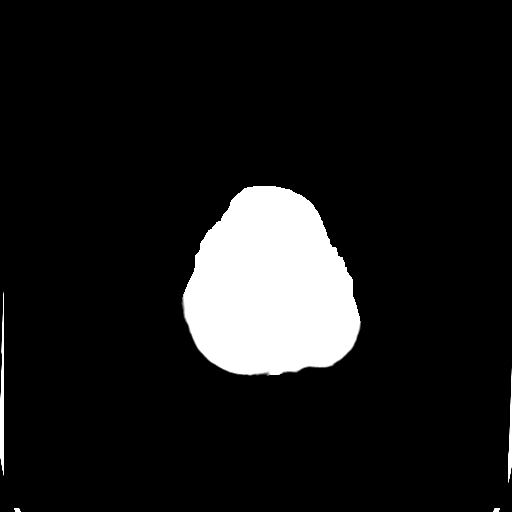

[Series 3: head 3.0 cor st · coronal · 0.30mm/px · 3 of 74 slices shown]
[im 25/74  brain]
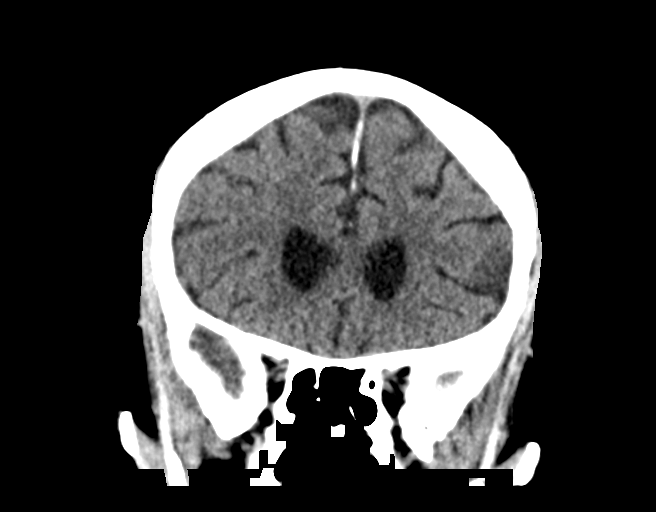
[im 33/74  brain]
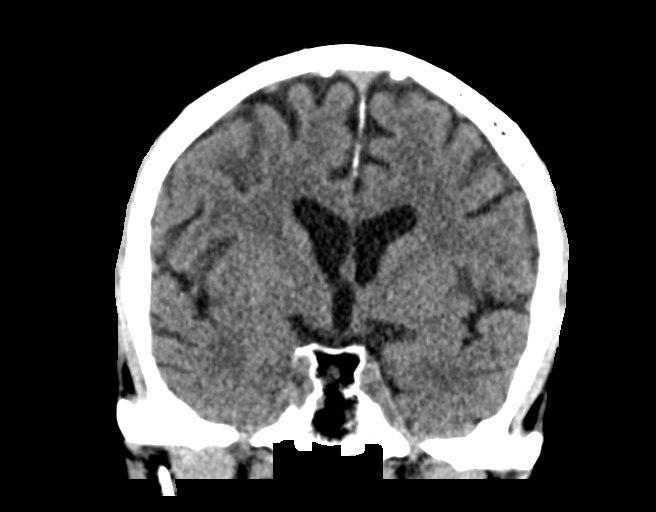
[im 41/74  brain]
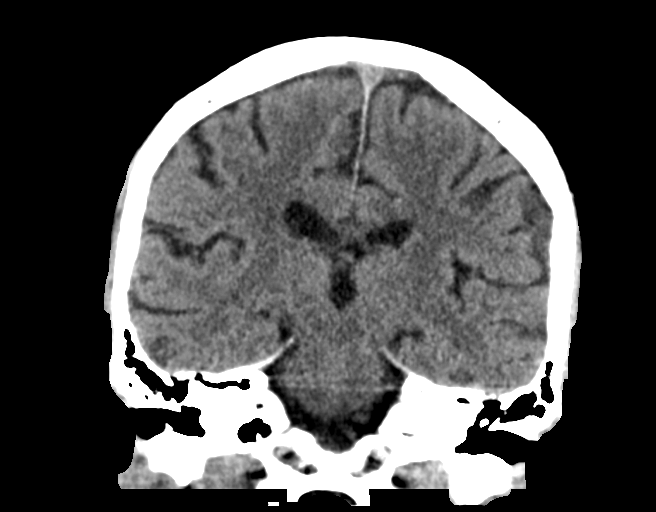

[Series 7: head 3.0 sag st · sagittal · 0.23mm/px · 3 of 65 slices shown]
[im 22/65  brain]
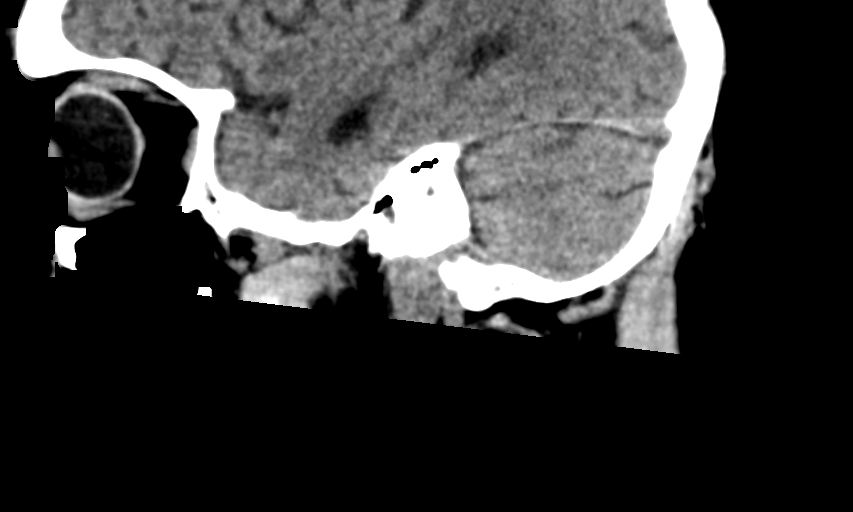
[im 33/65  brain]
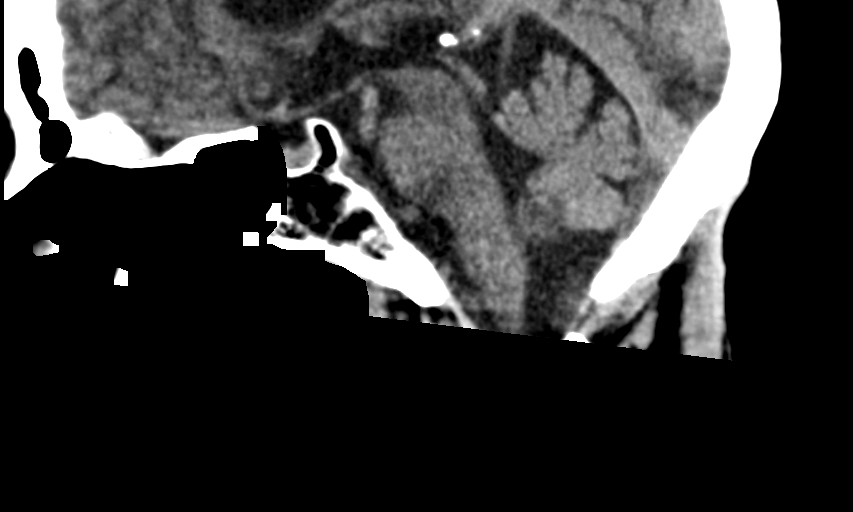
[im 43/65  brain]
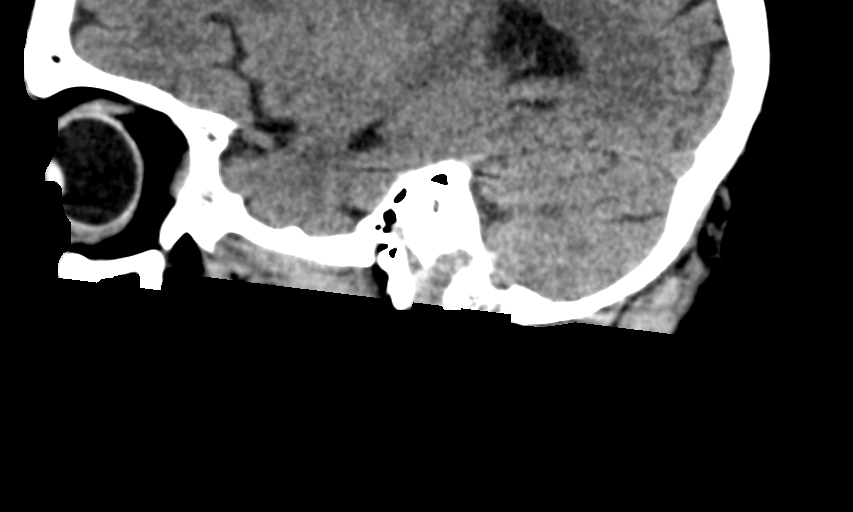

[14 of 47 positions shown; findings below may reference images not displayed]

FINDINGS: Brain: There is no acute intracranial hemorrhage, mass effect, or
edema. No new loss of gray-white differentiation. Ventricles and
sulci are stable in size and configuration. No extra-axial
collection.

Vascular: No hyperdense vessel.

Skull: Unremarkable.

Sinuses/Orbits: No acute abnormality.

Other: Mastoid air cells are clear.

ASPECTS (Alberta Stroke Program Early CT Score)

- Ganglionic level infarction (caudate, lentiform nuclei, internal
capsule, insula, M1-M3 cortex): 7

- Supraganglionic infarction (M4-M6 cortex): 3

Total score (0-10 with 10 being normal): 10
IMPRESSION: There is no acute intracranial hemorrhage or evidence of acute
infarction. ASPECT score is 10.

These results were communicated to Dr. ELIOT At [DATE] on [DATE]
by text page via the AMION messaging system.

## 2021-07-03 IMAGING — CT CT ANGIO HEAD-NECK (W OR W/O PERF)
2 of 7 series · 8 of 33 positions shown · IV contrast (OMNI 350)
Comparison: None.

CLINICAL DATA: Neuro deficit, acute, stroke suspected

EXAM:
CT ANGIOGRAPHY HEAD AND NECK
TECHNIQUE: Multidetector CT imaging of the head and neck was performed using
the standard protocol during bolus administration of intravenous
contrast. Multiplanar CT image reconstructions and MIPs were
obtained to evaluate the vascular anatomy. Carotid stenosis
measurements (when applicable) are obtained utilizing NASCET
criteria, using the distal internal carotid diameter as the
denominator.

[Series 5: cta neck · axial · 0.45mm/px · z∈[-188,-64]mm · 2 of 188 slices shown]
[im 63/188  soft-tissue]
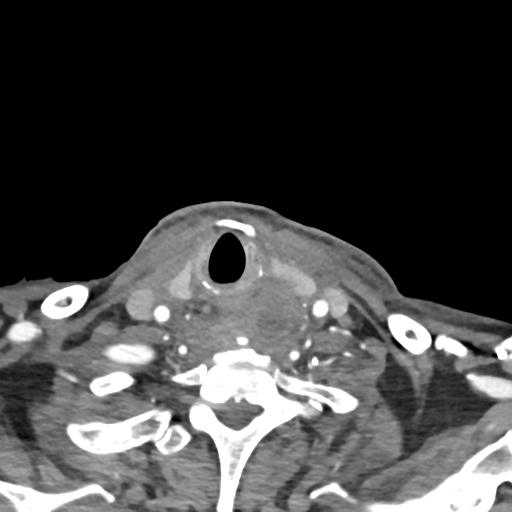
[im 125/188  soft-tissue]
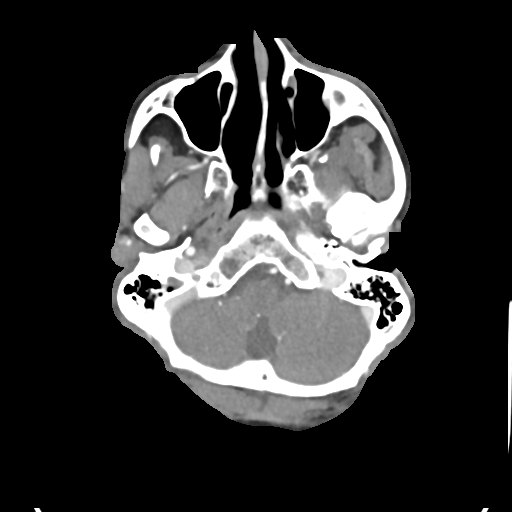

[Series 7: cta neck axial · axial · 0.39mm/px · z∈[-252,+14]mm · 6 of 374 slices shown]
[im 54/374  soft-tissue]
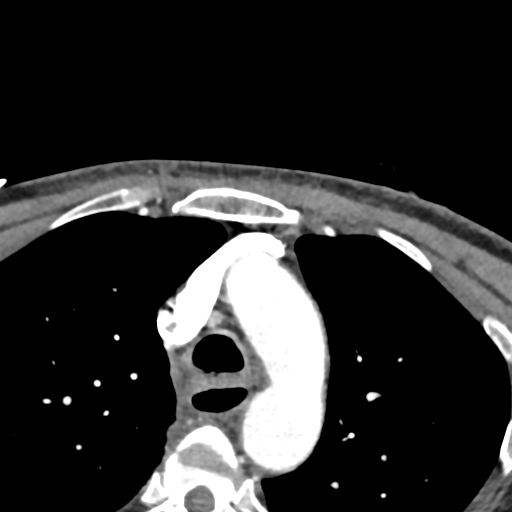
[im 107/374  bone]
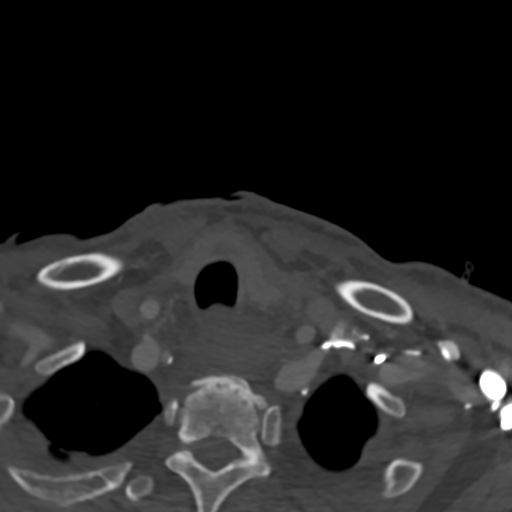
[im 160/374  soft-tissue]
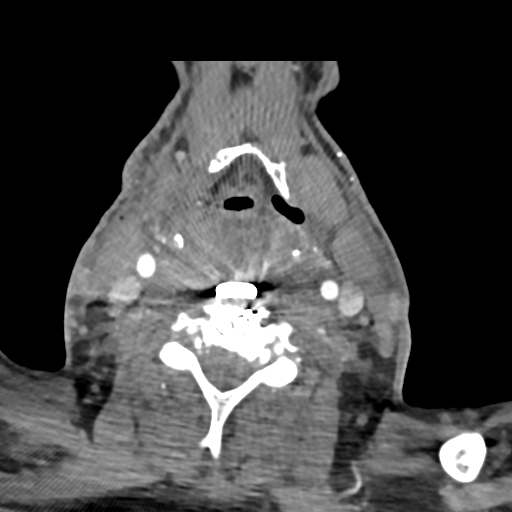
[im 214/374  bone]
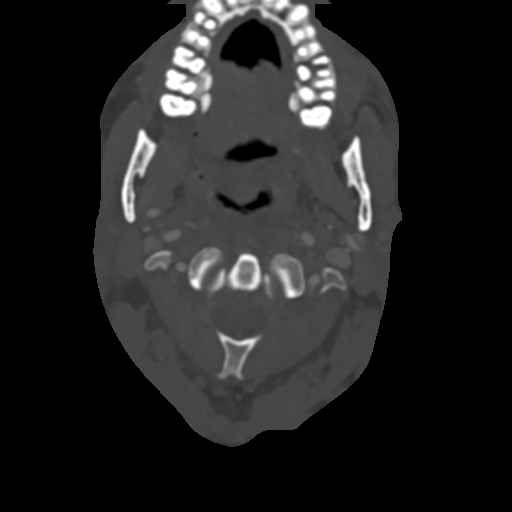
[im 267/374  soft-tissue]
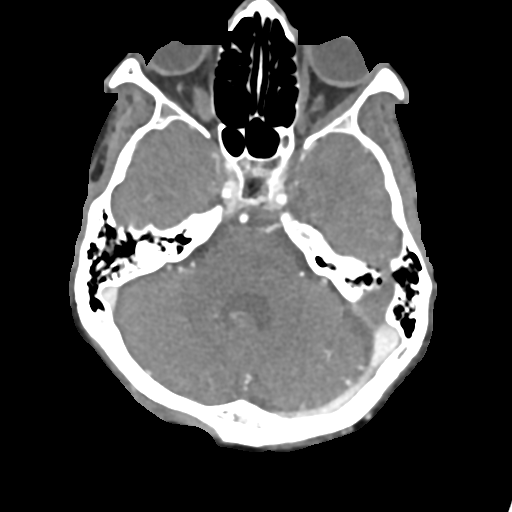
[im 320/374  bone]
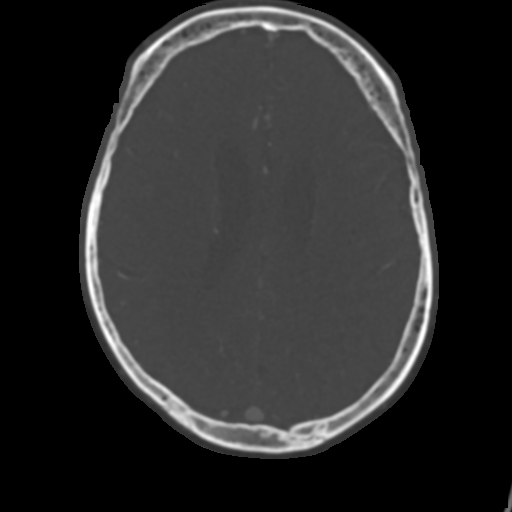

[8 of 33 positions shown; findings below may reference images not displayed]

RADIATION DOSE REDUCTION: This exam was performed according to the
departmental dose-optimization program which includes automated
exposure control, adjustment of the mA and/or kV according to
patient size and/or use of iterative reconstruction technique.

CONTRAST:  75mL OMNIPAQUE IOHEXOL 350 MG/ML SOLN
FINDINGS: CTA NECK FINDINGS

Aortic arch: Great vessel origins are patent.

Right carotid system: Patent.  No stenosis.

Left carotid system: Patent.  No stenosis.

Vertebral arteries: Patent and codominant.  No stenosis.

Skeleton: Postoperative changes of anterior fusion from C3-C7 with
plate and screw fixation and interbody allografts. Foci of gas are
present in the ventral epidural space at C4-C5.

Other neck: There is significant prevertebral soft tissue swelling.
Postoperative changes are present in the neck.

Upper chest: Emphysema.

Review of the MIP images confirms the above findings

CTA HEAD FINDINGS

Anterior circulation: Intracranial internal carotid arteries are
patent with minor calcified plaque. Anterior and middle cerebral
arteries are patent.

Posterior circulation: Intracranial vertebral arteries are patent.
Basilar artery is patent. Major cerebellar artery origins are
patent. A left posterior communicating artery is present. Posterior
cerebral arteries are patent.

Venous sinuses: As permitted by contrast timing, patent.

Review of the MIP images confirms the above findings
IMPRESSION: No large vessel occlusion, hemodynamically significant stenosis, or
evidence of dissection.

Postoperative changes of recent C3-C7 ACDF. There is significant
prevertebral soft tissue swelling. Foci of gas are present the
ventral epidural space at C4-C5; a small collection is not excluded.

## 2021-07-03 IMAGING — DX DG CHEST 1V PORT
1 series · 1 of 1 positions shown · non-contrast
Comparison: One-view abdomen [DATE]

CLINICAL DATA: Aspiration pneumonia.

EXAM:
PORTABLE CHEST 1 VIEW

[chest]
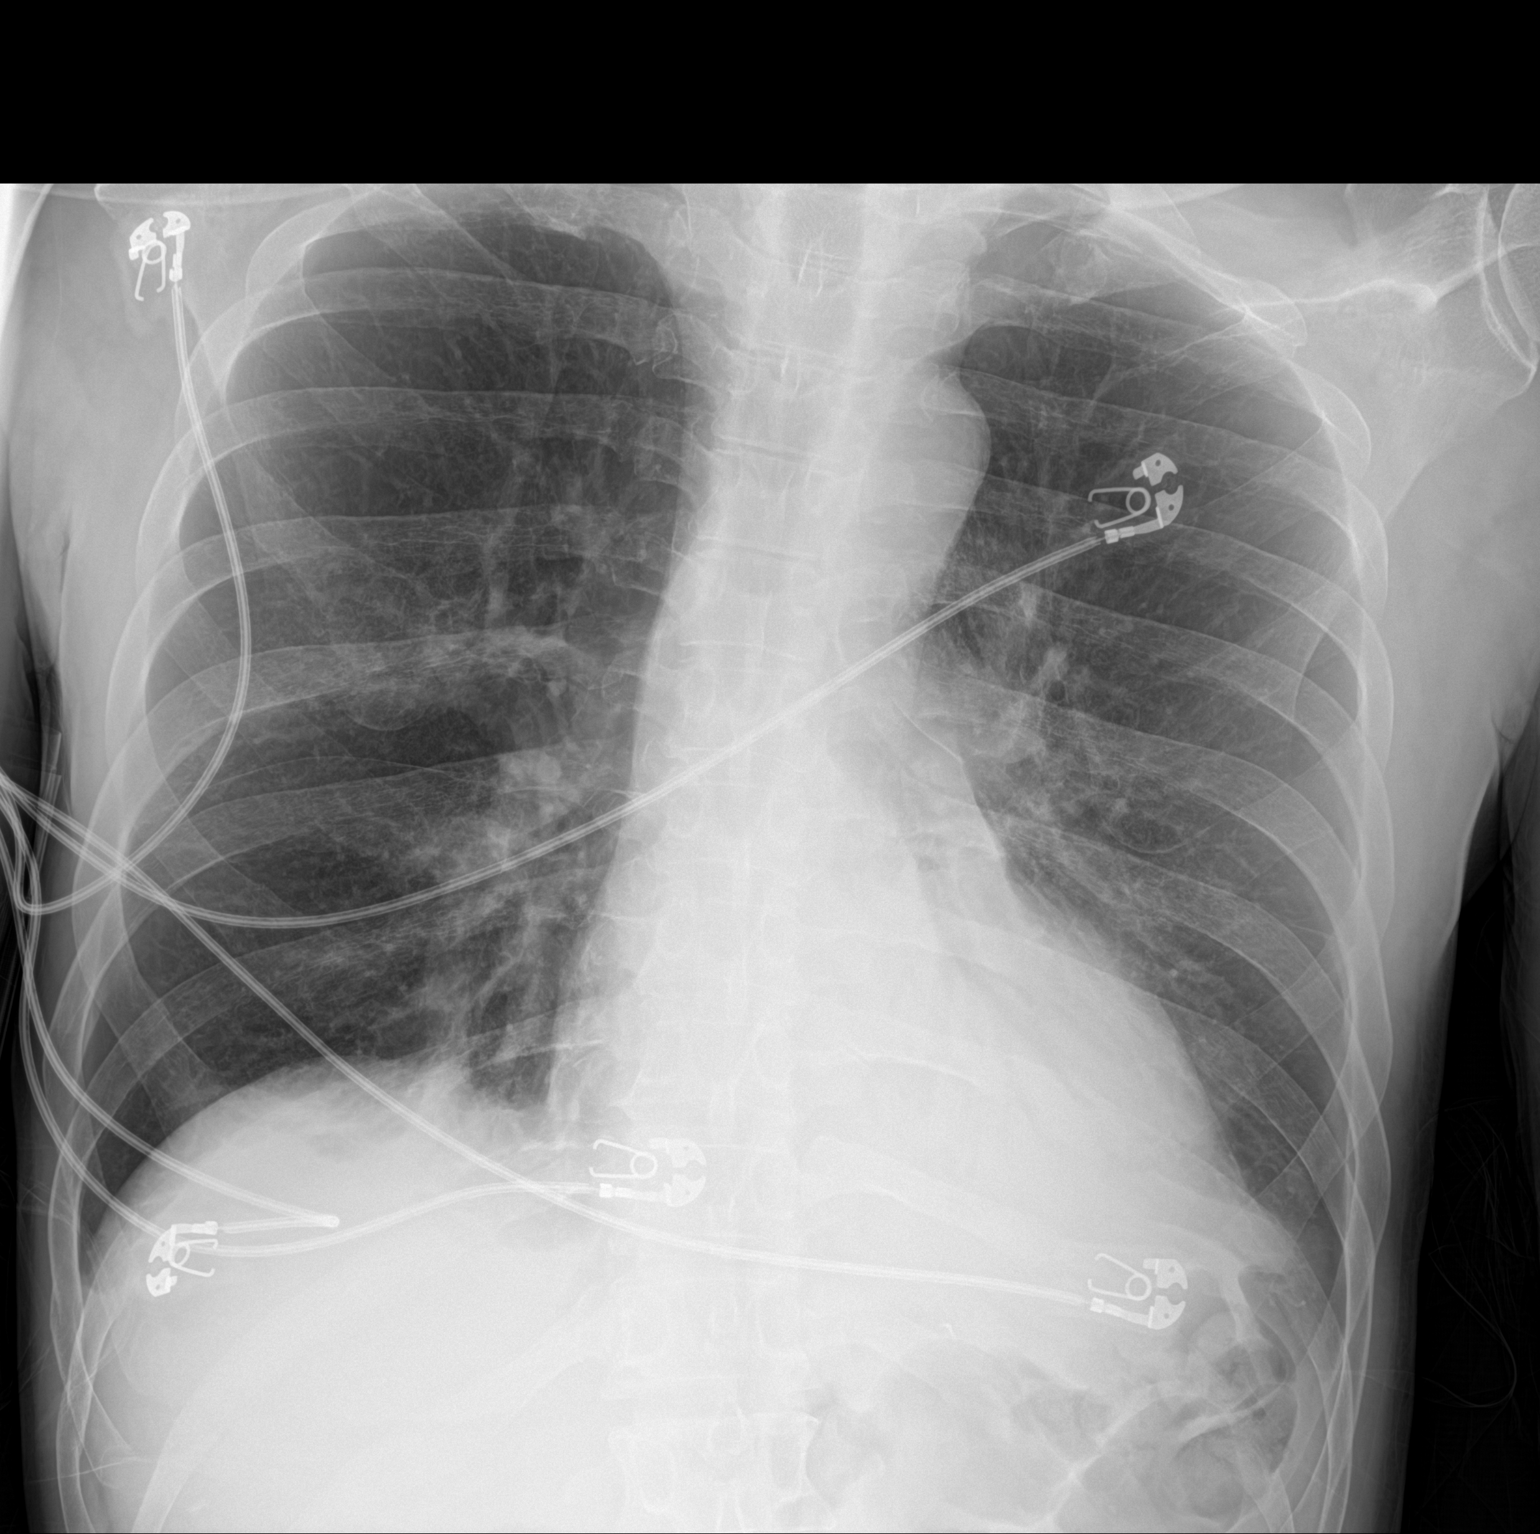

[1 of 1 positions shown; findings below may reference images not displayed]

FINDINGS: The heart size is normal. Left lower lobe airspace consolidation is
present with air bronchograms posterior to the cardiac silhouette.
Medial right basilar airspace disease stable. Upper lung fields are
clear. Overall volumes are improved.
IMPRESSION: 1. Left lower lobe airspace disease compatible with pneumonia.
2. Stable medial right basilar airspace disease.

## 2021-07-03 SURGERY — EVACUATION HEMATOMA
Anesthesia: General | Site: Spine Cervical

## 2021-07-03 MED ORDER — METRONIDAZOLE 500 MG/100ML IV SOLN: 500.0000 mg | Freq: Two times a day (BID) | INTRAVENOUS | Status: DC

## 2021-07-03 MED ORDER — LIDOCAINE 2% (20 MG/ML) 5 ML SYRINGE
INTRAMUSCULAR | Status: AC
  Filled 2021-07-03: qty 5

## 2021-07-03 MED ORDER — ROCURONIUM BROMIDE 10 MG/ML (PF) SYRINGE
PREFILLED_SYRINGE | INTRAVENOUS | Status: DC | PRN
  Administered 2021-07-03: 10 mg via INTRAVENOUS
  Administered 2021-07-03: 50 mg via INTRAVENOUS

## 2021-07-03 MED ORDER — SODIUM CHLORIDE 0.9 % IV SOLN: 3.0000 g | Freq: Four times a day (QID) | INTRAVENOUS | Status: DC

## 2021-07-03 MED ORDER — SODIUM CHLORIDE 0.9 % IV SOLN: INTRAVENOUS | Status: DC

## 2021-07-03 MED ORDER — VANCOMYCIN HCL IN DEXTROSE 1-5 GM/200ML-% IV SOLN
1000.0000 mg | Freq: Two times a day (BID) | INTRAVENOUS | Status: DC
  Administered 2021-07-04: 1000 mg via INTRAVENOUS

## 2021-07-03 MED ORDER — DEXAMETHASONE SODIUM PHOSPHATE 10 MG/ML IJ SOLN: 10.0000 mg | Freq: Once | INTRAMUSCULAR | Status: AC

## 2021-07-03 MED ORDER — HEMOSTATIC AGENTS (NO CHARGE) OPTIME
TOPICAL | Status: DC | PRN
  Administered 2021-07-03: 1 via TOPICAL

## 2021-07-03 MED ORDER — CHLORHEXIDINE GLUCONATE 0.12 % MT SOLN
15.0000 mL | Freq: Two times a day (BID) | OROMUCOSAL | Status: DC
  Administered 2021-07-03 – 2021-07-06 (×7): 15 mL via OROMUCOSAL
  Filled 2021-07-03 (×5): qty 15

## 2021-07-03 MED ORDER — FENTANYL CITRATE (PF) 250 MCG/5ML IJ SOLN
INTRAMUSCULAR | Status: DC | PRN
  Administered 2021-07-03: 100 ug via INTRAVENOUS
  Administered 2021-07-03: 50 ug via INTRAVENOUS

## 2021-07-03 MED ORDER — ONDANSETRON HCL 4 MG/2ML IJ SOLN
INTRAMUSCULAR | Status: AC
  Filled 2021-07-03: qty 4

## 2021-07-03 MED ORDER — VANCOMYCIN HCL 1000 MG IV SOLR
INTRAVENOUS | Status: AC
  Filled 2021-07-03: qty 20

## 2021-07-03 MED ORDER — PROPOFOL 10 MG/ML IV BOLUS
INTRAVENOUS | Status: AC
  Filled 2021-07-03: qty 20

## 2021-07-03 MED ORDER — SUGAMMADEX SODIUM 200 MG/2ML IV SOLN
INTRAVENOUS | Status: DC | PRN
  Administered 2021-07-03: 200 mg via INTRAVENOUS

## 2021-07-03 MED ORDER — DIPHENHYDRAMINE HCL 50 MG/ML IJ SOLN
INTRAMUSCULAR | Status: AC
  Filled 2021-07-03: qty 1

## 2021-07-03 MED ORDER — ACETAMINOPHEN 10 MG/ML IV SOLN
INTRAVENOUS | Status: AC
  Filled 2021-07-03: qty 100

## 2021-07-03 MED ORDER — ONDANSETRON HCL 4 MG/2ML IJ SOLN
INTRAMUSCULAR | Status: DC | PRN
Start: 2021-07-03 — End: 2021-07-03
  Administered 2021-07-03: 4 mg via INTRAVENOUS

## 2021-07-03 MED ORDER — DIPHENHYDRAMINE HCL 50 MG/ML IJ SOLN
INTRAMUSCULAR | Status: DC | PRN
  Administered 2021-07-03: 12.5 mg via INTRAVENOUS

## 2021-07-03 MED ORDER — PROPOFOL 10 MG/ML IV BOLUS
INTRAVENOUS | Status: DC | PRN
Start: 2021-07-03 — End: 2021-07-03
  Administered 2021-07-03: 40 mg via INTRAVENOUS
  Administered 2021-07-03: 120 mg via INTRAVENOUS

## 2021-07-03 MED ORDER — DEXMEDETOMIDINE (PRECEDEX) IN NS 20 MCG/5ML (4 MCG/ML) IV SYRINGE
PREFILLED_SYRINGE | INTRAVENOUS | Status: AC
  Filled 2021-07-03: qty 5

## 2021-07-03 MED ORDER — FENTANYL CITRATE (PF) 100 MCG/2ML IJ SOLN: 25.0000 ug | INTRAMUSCULAR | Status: DC | PRN

## 2021-07-03 MED ORDER — VANCOMYCIN HCL 1500 MG/300ML IV SOLN
1500.0000 mg | Freq: Once | INTRAVENOUS | Status: AC
  Administered 2021-07-03: 1500 mg via INTRAVENOUS
  Filled 2021-07-03: qty 300

## 2021-07-03 MED ORDER — LACTATED RINGERS IV SOLN: INTRAVENOUS | Status: DC | PRN

## 2021-07-03 MED ORDER — DEXAMETHASONE SODIUM PHOSPHATE 10 MG/ML IJ SOLN
INTRAMUSCULAR | Status: AC
  Filled 2021-07-03: qty 1

## 2021-07-03 MED ORDER — FENTANYL CITRATE (PF) 250 MCG/5ML IJ SOLN
INTRAMUSCULAR | Status: AC
  Filled 2021-07-03: qty 5

## 2021-07-03 MED ORDER — ORAL CARE MOUTH RINSE
15.0000 mL | Freq: Two times a day (BID) | OROMUCOSAL | Status: DC
  Administered 2021-07-04 – 2021-07-15 (×17): 15 mL via OROMUCOSAL

## 2021-07-03 MED ORDER — VANCOMYCIN HCL 1000 MG IV SOLR
INTRAVENOUS | Status: DC | PRN
  Administered 2021-07-03: 1000 mg

## 2021-07-03 MED ORDER — THROMBIN 5000 UNITS EX SOLR
CUTANEOUS | Status: AC
  Filled 2021-07-03: qty 5000

## 2021-07-03 MED ORDER — DEXMEDETOMIDINE (PRECEDEX) IN NS 20 MCG/5ML (4 MCG/ML) IV SYRINGE
PREFILLED_SYRINGE | INTRAVENOUS | Status: DC | PRN
  Administered 2021-07-03: 8 ug via INTRAVENOUS
  Administered 2021-07-03: 12 ug via INTRAVENOUS

## 2021-07-03 MED ORDER — THROMBIN 5000 UNITS EX SOLR
CUTANEOUS | Status: DC | PRN
  Administered 2021-07-03 (×2): 5000 [IU] via TOPICAL

## 2021-07-03 MED ORDER — DEXAMETHASONE SODIUM PHOSPHATE 10 MG/ML IJ SOLN
4.0000 mg | Freq: Four times a day (QID) | INTRAMUSCULAR | Status: DC
  Administered 2021-07-03 – 2021-07-05 (×8): 4 mg via INTRAVENOUS
  Filled 2021-07-03: qty 0.4
  Filled 2021-07-03 (×4): qty 1
  Filled 2021-07-03: qty 0.4
  Filled 2021-07-03: qty 1
  Filled 2021-07-03: qty 0.4

## 2021-07-03 MED ORDER — 0.9 % SODIUM CHLORIDE (POUR BTL) OPTIME
TOPICAL | Status: DC | PRN
  Administered 2021-07-03: 1000 mL

## 2021-07-03 MED ORDER — METRONIDAZOLE 500 MG/100ML IV SOLN
500.0000 mg | Freq: Three times a day (TID) | INTRAVENOUS | Status: DC
  Administered 2021-07-03 – 2021-07-04 (×2): 500 mg via INTRAVENOUS
  Filled 2021-07-03 (×3): qty 100

## 2021-07-03 MED ORDER — CHLORHEXIDINE GLUCONATE CLOTH 2 % EX PADS
6.0000 | MEDICATED_PAD | Freq: Every day | CUTANEOUS | Status: DC
  Administered 2021-07-03 – 2021-07-04 (×2): 6 via TOPICAL

## 2021-07-03 MED ORDER — ACETAMINOPHEN 10 MG/ML IV SOLN
INTRAVENOUS | Status: DC | PRN
  Administered 2021-07-03: 1000 mg via INTRAVENOUS

## 2021-07-03 MED ORDER — LIDOCAINE 2% (20 MG/ML) 5 ML SYRINGE
INTRAMUSCULAR | Status: DC | PRN
  Administered 2021-07-03: 60 mg via INTRAVENOUS

## 2021-07-03 MED ORDER — ACETAMINOPHEN 10 MG/ML IV SOLN: 1000.0000 mg | Freq: Once | INTRAVENOUS | Status: DC | PRN

## 2021-07-03 MED ORDER — ROCURONIUM BROMIDE 10 MG/ML (PF) SYRINGE
PREFILLED_SYRINGE | INTRAVENOUS | Status: AC
  Filled 2021-07-03: qty 10

## 2021-07-03 MED ORDER — THROMBIN 5000 UNITS EX SOLR
CUTANEOUS | Status: AC
  Filled 2021-07-03: qty 10000

## 2021-07-03 MED ORDER — PHENYLEPHRINE HCL-NACL 20-0.9 MG/250ML-% IV SOLN
INTRAVENOUS | Status: DC | PRN
  Administered 2021-07-03: 15 ug/min via INTRAVENOUS

## 2021-07-03 MED ORDER — IOHEXOL 350 MG/ML SOLN
75.0000 mL | Freq: Once | INTRAVENOUS | Status: AC | PRN
  Administered 2021-07-03: 75 mL via INTRAVENOUS

## 2021-07-03 MED ORDER — THROMBIN 5000 UNITS EX SOLR: CUTANEOUS | Status: DC | PRN

## 2021-07-03 MED ORDER — PROMETHAZINE HCL 25 MG/ML IJ SOLN: 6.2500 mg | INTRAMUSCULAR | Status: DC | PRN

## 2021-07-03 MED ORDER — SODIUM CHLORIDE 0.9 % IV SOLN
2.0000 g | Freq: Three times a day (TID) | INTRAVENOUS | Status: DC
  Administered 2021-07-03 – 2021-07-04 (×3): 2 g via INTRAVENOUS
  Filled 2021-07-03 (×3): qty 2

## 2021-07-03 MED FILL — Thrombin For Soln 5000 Unit: CUTANEOUS | Qty: 5000 | Status: AC

## 2021-07-03 SURGICAL SUPPLY — 50 items
BAG COUNTER SPONGE SURGICOUNT (BAG) ×2 IMPLANT
BAG DECANTER FOR FLEXI CONT (MISCELLANEOUS) ×2 IMPLANT
BAND RUBBER #18 3X1/16 STRL (MISCELLANEOUS) ×4 IMPLANT
BENZOIN TINCTURE PRP APPL 2/3 (GAUZE/BANDAGES/DRESSINGS) ×2 IMPLANT
BUR MATCHSTICK NEURO 3.0 LAGG (BURR) ×2 IMPLANT
CANISTER SUCT 3000ML PPV (MISCELLANEOUS) ×2 IMPLANT
CARTRIDGE OIL MAESTRO DRILL (MISCELLANEOUS) ×1 IMPLANT
CLSR STERI-STRIP ANTIMIC 1/2X4 (GAUZE/BANDAGES/DRESSINGS) ×1 IMPLANT
DIFFUSER DRILL AIR PNEUMATIC (MISCELLANEOUS) ×2 IMPLANT
DRAPE C-ARM 42X72 X-RAY (DRAPES) ×4 IMPLANT
DRAPE LAPAROTOMY 100X72 PEDS (DRAPES) ×2 IMPLANT
DRAPE MICROSCOPE LEICA (MISCELLANEOUS) ×2 IMPLANT
DURAPREP 6ML APPLICATOR 50/CS (WOUND CARE) ×2 IMPLANT
ELECT COATED BLADE 2.86 ST (ELECTRODE) ×2 IMPLANT
ELECT REM PT RETURN 9FT ADLT (ELECTROSURGICAL) ×2
ELECTRODE REM PT RTRN 9FT ADLT (ELECTROSURGICAL) ×1 IMPLANT
EVACUATOR 1/8 PVC DRAIN (DRAIN) ×1 IMPLANT
GAUZE 4X4 16PLY ~~LOC~~+RFID DBL (SPONGE) IMPLANT
GAUZE SPONGE 4X4 12PLY STRL (GAUZE/BANDAGES/DRESSINGS) ×2 IMPLANT
GAUZE SPONGE 4X4 12PLY STRL LF (GAUZE/BANDAGES/DRESSINGS) ×1 IMPLANT
GLOVE EXAM NITRILE XL STR (GLOVE) IMPLANT
GLOVE SURG LTX SZ9 (GLOVE) ×2 IMPLANT
GLOVE SURG POLYISO LF SZ7 (GLOVE) ×1 IMPLANT
GLOVE SURG UNDER POLY LF SZ6.5 (GLOVE) ×2 IMPLANT
GLOVE SURG UNDER POLY LF SZ7.5 (GLOVE) ×1 IMPLANT
GOWN STRL REUS W/ TWL LRG LVL3 (GOWN DISPOSABLE) IMPLANT
GOWN STRL REUS W/ TWL XL LVL3 (GOWN DISPOSABLE) IMPLANT
GOWN STRL REUS W/TWL 2XL LVL3 (GOWN DISPOSABLE) IMPLANT
GOWN STRL REUS W/TWL LRG LVL3 (GOWN DISPOSABLE) ×2
GOWN STRL REUS W/TWL XL LVL3 (GOWN DISPOSABLE) ×2
HALTER HD/CHIN CERV TRACTION D (MISCELLANEOUS) ×2 IMPLANT
HEMOSTAT POWDER KIT SURGIFOAM (HEMOSTASIS) ×1 IMPLANT
KIT BASIN OR (CUSTOM PROCEDURE TRAY) ×2 IMPLANT
KIT TURNOVER KIT B (KITS) ×2 IMPLANT
NDL SPNL 20GX3.5 QUINCKE YW (NEEDLE) ×1 IMPLANT
NEEDLE SPNL 20GX3.5 QUINCKE YW (NEEDLE) ×2 IMPLANT
NS IRRIG 1000ML POUR BTL (IV SOLUTION) ×2 IMPLANT
OIL CARTRIDGE MAESTRO DRILL (MISCELLANEOUS) ×2
PACK LAMINECTOMY NEURO (CUSTOM PROCEDURE TRAY) ×2 IMPLANT
PAD ARMBOARD 7.5X6 YLW CONV (MISCELLANEOUS) ×7 IMPLANT
SPONGE INTESTINAL PEANUT (DISPOSABLE) ×2 IMPLANT
SPONGE SURGIFOAM ABS GEL SZ50 (HEMOSTASIS) ×2 IMPLANT
STRIP CLOSURE SKIN 1/2X4 (GAUZE/BANDAGES/DRESSINGS) ×2 IMPLANT
SUT VIC AB 3-0 SH 8-18 (SUTURE) ×2 IMPLANT
SUT VIC AB 4-0 RB1 18 (SUTURE) ×2 IMPLANT
TAPE CLOTH 4X10 WHT NS (GAUZE/BANDAGES/DRESSINGS) ×2 IMPLANT
TOWEL GREEN STERILE (TOWEL DISPOSABLE) ×2 IMPLANT
TOWEL GREEN STERILE FF (TOWEL DISPOSABLE) ×2 IMPLANT
TRAP SPECIMEN MUCUS 40CC (MISCELLANEOUS) ×2 IMPLANT
WATER STERILE IRR 1000ML POUR (IV SOLUTION) ×2 IMPLANT

## 2021-07-03 NOTE — Progress Notes (Addendum)
Patient HR has been sustaining in the 130s throughout the night. Patient is complaining of difficulty swallowing and is currently NPO. Patient has numbness to bilateral upper and lower extremities and is unable to move extremities independently. Patient was placed on tele monitoring. Neurosurgery was unable to be reached last night.  0730: Rapid response was called and patient taken to CT.  Late Entry: Charge nurse was notified about patient's condition after attempting to reach Neurosurgery x2 [phone] - answering service) with no success. The on-coming charge nurse and on-coming assigned nurse to the patient were also notified of the patient's condition. This nurse called rapid response.

## 2021-07-03 NOTE — Anesthesia Preprocedure Evaluation (Addendum)
Anesthesia Evaluation  Patient identified by MRN, date of birth, ID band Patient awake    Reviewed: Allergy & Precautions, NPO status , Patient's Chart, lab work & pertinent test results  History of Anesthesia Complications Negative for: history of anesthetic complications  Airway Mallampati: II  TM Distance: >3 FB Neck ROM: Full    Dental  (+) Poor Dentition   Pulmonary Current Smoker and Patient abstained from smoking.,    Pulmonary exam normal        Cardiovascular negative cardio ROS Normal cardiovascular exam     Neuro/Psych S/P ACDF 06/29/21, acute onset of decreased extremity movement, dysarthria, and subjective complaint of inability to tolerate his secretions this morning negative psych ROS   GI/Hepatic negative GI ROS, Neg liver ROS,   Endo/Other  negative endocrine ROS  Renal/GU negative Renal ROS  negative genitourinary   Musculoskeletal negative musculoskeletal ROS (+)   Abdominal   Peds  Hematology  (+) Blood dyscrasia, anemia , Hgb 9.9   Anesthesia Other Findings Day of surgery medications reviewed with patient.  Reproductive/Obstetrics negative OB ROS                            Anesthesia Physical Anesthesia Plan  ASA: 3 and emergent  Anesthesia Plan: General   Post-op Pain Management: Ofirmev IV (intra-op)   Induction: Intravenous  PONV Risk Score and Plan: 1 and Treatment may vary due to age or medical condition, Ondansetron and Midazolam  Airway Management Planned: Oral ETT and Video Laryngoscope Planned  Additional Equipment: None  Intra-op Plan:   Post-operative Plan: Extubation in OR  Informed Consent: I have reviewed the patients History and Physical, chart, labs and discussed the procedure including the risks, benefits and alternatives for the proposed anesthesia with the patient or authorized representative who has indicated his/her understanding and  acceptance.     Dental advisory given  Plan Discussed with: CRNA  Anesthesia Plan Comments:        Anesthesia Quick Evaluation

## 2021-07-03 NOTE — Consult Note (Signed)
NAME:  Brian [name], MRN:  [id number], DOB:  1960-12-21, LOS: 4 ADMISSION DATE:  06/29/2021, CONSULTATION DATE:  07/03/21 REFERRING MD:  Reinaldo Meeker, NP, CHIEF COMPLAINT:  AMS   History of Present Illness:   61 year old male with prior history of tobacco abuse and ETOH use who recently was found to have critical multilevel cervical spinal stenosis C3-6 with spinal cord compression and spinal cord signal change after recent fall with progressive upper and lower extremity weakness and tremors.  He was admitted on 2/3 and underwent ACDF of C3-4, C4-5, C5-5, and C6-7.  Post-operatively he was progressing slowly with residual weakness in both hands but improving lower extremity strength and function.  He developed some difficulty swallowing on 2/5 and SLP ordered and placed on thickened liquids.   Overnight 2/6, patient with increased difficulty swallowing and was made NPO but also noted to have dysarthria and difficulty moving extremities with numbness.  SLP evaluated and found to be moderate aspiration risk.   Also progressively tachycardic, tachypneic, and now febrile.  Code stroke activated and taken for CT/ CTA head and neck and was given decadron 10mg  once.  NIHSS 18.  CTH was negative for acute findings, and CTA head neck did not reveal any LVO but noted significant prevertebral soft tissue swelling with foci of gas present at the ventral epidural space at C4-5, small collection not excluded.  On 2/7, patient with worsening confusion and concern for airway involvement, PCCM consulted for further evaluation.   Pertinent  Medical History  Tobacco abuse, cervical stenosis, ETOH use   Significant Hospital Events: Including procedures, antibiotic start and stop dates in addition to other pertinent events   2/3 ACDF C3-4, C4-5, C5-5, and C6-7 w/ Dr. Annette Stable 2/6 SLP eval for difficulty swallowing 2/7 Code stroke overnight, neg for LVO, CT showing soft tissue swelling.  PCCM consulted for concern of airway  management and AMS  Images  2/7 CT angio head and neck > No large vessel occlusion, hemodynamically significant stenosis, or evidence of dissection. Significant prevertebral soft tissue swelling. Foci of gas are present the ventral epidural space at C4-C5; a small collection is not excluded.  Interim History / Subjective:  As above   Objective   Blood pressure (!) 150/112, pulse (!) 148, temperature (!) 103 F (39.4 C), temperature source Oral, resp. rate 16, height 5\' 10"  (1.778 m), weight 63.5 kg, SpO2 91 %.        Intake/Output Summary (Last 24 hours) at 07/03/2021 0932 Last data filed at 07/03/2021 0600 Gross per 24 hour  Intake 714.48 ml  Output 1100 ml  Net -385.52 ml   Filed Weights   06/28/21 1135 06/29/21 0554  Weight: 61.7 kg 63.5 kg   Examination: General: Acute on chronically ill appearing middle aged male lying in bed, in NAD HEENT: /AT, MM pink/moist, PERRL,  Neuro: Alert and oriented x3, unable to move extremities and deceased sensation thought all extremities  CV: s1s2 regular rate and rhythm, no murmur, rubs, or gallops,  PULM:  Bilateral rhonchi with tachypnea, s/p NTS suction, currently protecting airway  GI: soft, bowel sounds active in all 4 quadrants, non-tender, non-distended Extremities: warm/dry, no edema  Skin: no rashes or lesions  Resolved Hospital Problem list    Assessment & Plan:  Concern for evolving sepsis secondary to aspiration pneumonia  -On am assessment 2/7 patient was seen with fever 103, heart rate 125-135, respiratory rate 16-21 in the setting of continues but improved leukocytosis and acute concern  for aspiration event  P: Transfer to  ICU Supplemental oxygen as needed for sat goal > 92 Low threshold to intubate, close monitoring of ability to protect airway  Strict NPO  Pan cultures prior to antibiotic IV Unasyn  Gentle IV hydration Obtain lactic acid Monitor urine output Repeat CXR  Severe cervical spinal stenosis with  spinal cord compression  -s/p ACDF C3-4, C4-5, C5-5, and C6-7 on 2/3  -CTA head and neck revealed significant prevertebral soft tissue swelling. Foci of gas are present the ventral epidural space at C4-C5;  P: Primary management per NSGY STAT MRI neck Maintain neuro protective measures Nutrition and bowel regiment, will need cortrack  Seizure precautions  Aspirations precautions   Daily tobacco use  Occasional ETOH use P: Monitor for signs of withdrawal  Tobacco cessation education when appropriate   Best Practice (right click and "Reselect all SmartList Selections" daily)   Diet/type: NPO DVT prophylaxis: other Per Primary  GI prophylaxis: N/A Lines: N/A Foley:  Yes, and it is still needed Code Status:  full code Last date of multidisciplinary goals of care discussion: Per Primary   Labs   CBC: Recent Labs  Lab 07/02/21 0515 07/03/21 0343  WBC 16.3* 11.3*  NEUTROABS 11.2*  --   HGB 11.0* 9.9*  HCT 32.6* 30.0*  MCV 101.6* 101.0*  PLT 189 [id number]    Basic Metabolic Panel: Recent Labs  Lab 07/02/21 0515 07/03/21 0343  NA 133* 135  K 4.5 4.1  CL 100 102  CO2 24 21*  GLUCOSE 97 85  BUN 15 16  CREATININE 0.88 0.77  CALCIUM 9.6 9.4   GFR: Estimated Creatinine Clearance: 88.2 mL/min (by C-G formula based on SCr of 0.77 mg/dL). Recent Labs  Lab 07/02/21 0515 07/03/21 0343  WBC 16.3* 11.3*    Liver Function Tests: No results for input(s): AST, ALT, ALKPHOS, BILITOT, PROT, ALBUMIN in the last 168 hours. No results for input(s): LIPASE, AMYLASE in the last 168 hours. No results for input(s): AMMONIA in the last 168 hours.  ABG No results found for: PHART, PCO2ART, PO2ART, HCO3, TCO2, ACIDBASEDEF, O2SAT   Coagulation Profile: No results for input(s): INR, PROTIME in the last 168 hours.  Cardiac Enzymes: No results for input(s): CKTOTAL, CKMB, CKMBINDEX, TROPONINI in the last 168 hours.  HbA1C: No results found for: HGBA1C  CBG: Recent Labs  Lab  07/03/21 0730  GLUCAP 83    Review of Systems:   Please see the history of present illness. All other systems reviewed and are negative   Past Medical History:  He,  has no past medical history on file.   Surgical History:   Past Surgical History:  Procedure Laterality Date   ANTERIOR CERVICAL DECOMPRESSION/DISCECTOMY FUSION 4 LEVELS N/A 06/29/2021   Procedure: Anterior Cervical Discectomy Fusion - Cervical Three-Cervical Four - Cervical Four- Cervical Five - Cervical Five- Cervical Six - Cervical Six- Cervical Seven;  Surgeon: Earnie Larsson, MD;  Location: [place];  Service: Neurosurgery;  Laterality: N/A;     Social History:   reports that he has been smoking cigarettes. He has been smoking an average of .33 packs per day. He has never used smokeless tobacco. He reports that he does not currently use alcohol. He reports current drug use. Drug: Marijuana.   Family History:  His family history is not on file.   Allergies No Known Allergies   Home Medications  Prior to Admission medications   Medication Sig Start Date End Date Taking? Authorizing Provider  naproxen  sodium (ALEVE) 220 MG tablet Take 440 mg by mouth 2 (two) times daily as needed (pain.).   Yes [provider]  methylPREDNISolone (MEDROL DOSEPAK) 4 MG TBPK tablet Take as prescribed Patient not taking: Reported on 06/27/2021 06/19/21   Teressa Lower, MD     Critical care time:  CRITICAL CARE Performed by: Nikiah Goin D. Harris  Total critical care time: 40 minutes  Critical care time was exclusive of separately billable procedures and treating other patients.  Critical care was necessary to treat or prevent imminent or life-threatening deterioration.  Critical care was time spent personally by me on the following activities: development of treatment plan with patient and/or surrogate as well as nursing, discussions with consultants, evaluation of patient's response to treatment, examination of patient,  obtaining history from patient or surrogate, ordering and performing treatments and interventions, ordering and review of laboratory studies, ordering and review of radiographic studies, pulse oximetry and re-evaluation of patient's condition.  Iline Buchinger D. Kenton Kingfisher, NP-C [facility] Personal contact information can be found on Amion  07/03/2021, 10:10 AM

## 2021-07-03 NOTE — Progress Notes (Signed)
Patient with worsening of his situation overnight.  This morning he was apparently unable to move his arms or legs voluntarily.  He remained tachycardic.  His blood pressure was good.  His urine output was minimal.  He was found to be in acute urinary retention and a catheter was placed with good return of urine.  The patient was given a fluid challenge and Decadron and his Strength has improved.  He now has 4-/5 strength in his left upper and lower extremity.  He has 3/5 strength in his right upper and lower extremity.  He denies neck pain.  He is having no airway issues.  His wound is soft and flat.  His airway is midline.  Head CT scan is negative for obvious acute stroke.  CT angiogram of his neck and brain is negative for obstructive vascular lesion.  The patient has postoperative change from C3-C7 with good appearance of his bony decompression and no evidence of cage or hardware this position.  There is fluid within the disc spaces laterally which is normal.  There is some question of possible early compressive hematoma at C4-5 although this is not well delineated.  Patient with neurologic worsening of unclear etiology.  We will transfer to ICU and continue with fluid resuscitation.  He will be started on antibiotics for possible aspiration pneumonia.  We will continue with catheter drainage for his urinary obstruction.  With regard to his neurologic worsening we will get an MRI scan of his cervical spine to evaluate for compressive lesion.

## 2021-07-03 NOTE — Anesthesia Procedure Notes (Addendum)
Procedure Name: Intubation Date/Time: 07/03/2021 2:19 PM Performed by: Claris Che, CRNA Pre-anesthesia Checklist: Patient identified, Emergency Drugs available, Suction available, Patient being monitored and Timeout performed Patient Re-evaluated:Patient Re-evaluated prior to induction Oxygen Delivery Method: Circle system utilized Preoxygenation: Pre-oxygenation with 100% oxygen Induction Type: IV induction and Cricoid Pressure applied Ventilation: Mask ventilation without difficulty Laryngoscope Size: Glidescope and 4 Grade View: Grade II Tube type: Oral Tube size: 7.5 mm Number of attempts: 1 Airway Equipment and Method: Rigid stylet and Video-laryngoscopy Placement Confirmation: ETT inserted through vocal cords under direct vision, positive ETCO2 and breath sounds checked- equal and bilateral Secured at: 23 cm Tube secured with: Tape Dental Injury: Teeth and Oropharynx as per pre-operative assessment  Comments: Midline neck stabilization maintained throughout induction and intubation.

## 2021-07-03 NOTE — Progress Notes (Signed)
MRI scan of cervical spine demonstrates evidence of significant anterior epidural hemorrhage extending from C2 down to C7 with significant cord compression.  Patient with significant worsening of his myelopathy today with evidence of postoperative epidural hemorrhage.  I discussed situation with patient.  I have recommended we move forward with reexploration of anterior cervical fusion and evacuation of epidural hematoma.

## 2021-07-03 NOTE — Progress Notes (Signed)
Pt transferred to [facility] ICU for elevated levels of care

## 2021-07-03 NOTE — Consult Note (Signed)
Neurology Consultation  Reason for Consult: Code Stroke Referring Physician: Dr. Annette Stable  CC: Pain with extremity movement  History is obtained from: Patient, Chart review, Bedside RN  HPI: Brian [name] is a 60 y.o. male with a medical history significant for critical multilevel cervical spinal stenosis with spinal cord compression and spinal cord signal change s/p C3-C7 ACDF on 06/29/21. Patient was noted to be improving following surgery and was able to move all of his extremities and planned for possible CIR. He was last seen at his baseline at 03:00 this morning and on morning rounds, he was noted to have difficulty with moving all of his extremities, dysarthria, and reported minimal trouble managing his secretions. Per neurosurgery, patient was given 10 mg of decadron and a Code Stroke was activated for further evaluation.   LKW: 03:00 TNK given?: no, patient with recent surgical procedure. Patient's presentation is felt to most likely be related to his cervical spine presentation and operation and less likely stroke without unilateral weakness.  IR Thrombectomy? No, vessel imaging reviewed without evidence of an LVO Modified Rankin Scale: 0-Completely asymptomatic and back to baseline post- stroke  ROS: A complete ROS was performed and is negative except as noted in the HPI.   History reviewed. No pertinent past medical history.  History reviewed. No pertinent family history.  Social History:   reports that he has been smoking cigarettes. He has been smoking an average of .33 packs per day. He has never used smokeless tobacco. He reports that he does not currently use alcohol. He reports current drug use. Drug: Marijuana.  Medications  Current Facility-Administered Medications:    0.9 %  sodium chloride infusion, 250 mL, Intravenous, Continuous, Pool, Mallie Mussel, MD, Last Rate: 1 mL/hr at 06/29/21 1316, 250 mL at 06/29/21 1316   0.9 %  sodium chloride infusion, , Intravenous, Continuous,  Bergman, Meghan D, NP, Last Rate: 50 mL/hr at 07/03/21 0906, New Bag at 07/03/21 0906   acetaminophen (TYLENOL) tablet 650 mg, 650 mg, Oral, Q4H PRN, 650 mg at 06/30/21 0640 **OR** acetaminophen (TYLENOL) suppository 650 mg, 650 mg, Rectal, Q4H PRN, Earnie Larsson, MD, 650 mg at 07/02/21 1744   bisacodyl (DULCOLAX) suppository 10 mg, 10 mg, Rectal, Daily PRN, Viona Gilmore D, NP, 10 mg at 07/02/21 [id number]   Chlorhexidine Gluconate Cloth 2 % PADS 6 each, 6 each, Topical, Daily, Pool, Mallie Mussel, MD   cyclobenzaprine (FLEXERIL) tablet 10 mg, 10 mg, Oral, TID PRN, Earnie Larsson, MD, 10 mg at 07/01/21 2123   dexamethasone (DECADRON) injection 4 mg, 4 mg, Intravenous, Q6H, Bergman, Meghan D, NP   HYDROcodone-acetaminophen (NORCO) 10-325 MG per tablet 1-2 tablet, 1-2 tablet, Oral, Q4H PRN, Earnie Larsson, MD, 2 tablet at 07/02/21 0525   HYDROcodone-acetaminophen (NORCO/VICODIN) 5-325 MG per tablet 1 tablet, 1 tablet, Oral, Q4H PRN, Earnie Larsson, MD, 1 tablet at 07/01/21 0949   HYDROmorphone (DILAUDID) injection 1 mg, 1 mg, Intravenous, Q2H PRN, Earnie Larsson, MD, 1 mg at 07/03/21 [id number]   menthol-cetylpyridinium (CEPACOL) lozenge 3 mg, 1 lozenge, Oral, PRN **OR** phenol (CHLORASEPTIC) mouth spray 1 spray, 1 spray, Mouth/Throat, PRN, Pool, Mallie Mussel, MD   ondansetron (ZOFRAN) tablet 4 mg, 4 mg, Oral, Q6H PRN **OR** ondansetron (ZOFRAN) injection 4 mg, 4 mg, Intravenous, Q6H PRN, Earnie Larsson, MD, 4 mg at 06/30/21 0008   senna-docusate (Senokot-S) tablet 1 tablet, 1 tablet, Oral, BID, Bergman, Meghan D, NP, 1 tablet at 07/02/21 0903   sodium chloride flush (NS) 0.9 % injection 3 mL, 3 mL, Intravenous,  Hervey Ard, MD, 3 mL at 07/03/21 0905   sodium chloride flush (NS) 0.9 % injection 3 mL, 3 mL, Intravenous, PRN, Earnie Larsson, MD   sodium phosphate (FLEET) 7-19 GM/118ML enema 1 enema, 1 enema, Rectal, Daily PRN, Viona Gilmore D, NP  Exam: Current vital signs: BP (!) 150/112    Pulse (!) 148    Temp (!) 103 F (39.4 C)  (Oral)    Resp 16    Ht 5\' 10"  (1.778 m)    Wt 63.5 kg    SpO2 91%    BMI 20.09 kg/m  Vital signs in last 24 hours: Temp:  [98.8 F (37.1 C)-103 F (39.4 C)] 103 F (39.4 C) (02/07 0724) Pulse Rate:  [104-148] 148 (02/07 0755) Resp:  [14-20] 16 (02/07 0755) BP: (123-154)/(80-112) 150/112 (02/07 0755) SpO2:  [91 %-96 %] 91 % (02/07 0300)  GENERAL: Awake, alert, appears to be in pain on assessment  Psych: Affect appropriate for situation, patient is calm and cooperative with examination Head: Normocephalic and atraumatic, without obvious abnormality EENT: Normal conjunctivae, dry mucous membranes, no OP obstruction, soft cervical collar in place LUNGS: Normal respiratory effort. Non-labored breathing on room air CV: Tachycardia on cardiac monitor ABDOMEN: Soft, non-tender, non-distended Extremities: Warm, well perfused, without obvious deformity  NEURO:  Mental Status: Awake, alert, and oriented to person, place, time, and situation. He is able to provide a clear and coherent history of present illness. Speech/Language: speech is intact with minimal dysarthria.   No aphasia neglect is noted Cranial Nerves:  II: PERRL. Visual fields full.  III, IV, VI: EOMI without ptosis or gaze preference V: Sensation is intact to light touch and symmetrical to face.  VII: Face is symmetric resting and smiling. VIII: Hearing is intact to voice IX, X: Palate elevation is symmetric. Phonation normal.  XI: Normal sternocleidomastoid and trapezius muscle strength XII: Tongue protrudes midline without fasciculations.   Motor: Patient complains of significant pain with extremity movement. He is able to move all extremities without gravity and attempts antigravity movement throughout unsuccessfully.  Tone is normal. Bulk is normal.  Patient does have clonus with left ankle dorsiflexion.  Sensation: Intact to light touch bilaterally in all four extremities.  DTRs: 2+ throughout with significant reports  of burning pain with assessment of patellar reflexes.  Gait: Deferred  NIHSS: 1a Level of Conscious.: 0 1b LOC Questions: 0 1c LOC Commands: 0 2 Best Gaze: 0 3 Visual: 0 4 Facial Palsy: 0 5a Motor Arm - left: 3 5b Motor Arm - Right: 3 6a Motor Leg - Left: 3 6b Motor Leg - Right: 3 7 Limb Ataxia: 0 8 Sensory: 1 9 Best Language: 0 10 Dysarthria: 1 11 Extinct. and Inatten.: 0 TOTAL: 14  Labs I have reviewed labs in epic and the results pertinent to this consultation are: CBC    Component Value Date/Time   WBC 11.3 (H) 07/03/2021 0343   RBC 2.97 (L) 07/03/2021 0343   HGB 9.9 (L) 07/03/2021 0343   HCT 30.0 (L) 07/03/2021 0343   PLT 231 07/03/2021 0343   MCV 101.0 (H) 07/03/2021 0343   MCH 33.3 07/03/2021 0343   MCHC 33.0 07/03/2021 0343   RDW 14.2 07/03/2021 0343   LYMPHSABS 2.3 07/02/2021 0515   MONOABS 2.6 (H) 07/02/2021 0515   EOSABS 0.1 07/02/2021 0515   BASOSABS 0.1 07/02/2021 0515   CMP     Component Value Date/Time   NA 135 07/03/2021 0343   K 4.1 07/03/2021 0343  CL 102 07/03/2021 0343   CO2 21 (L) 07/03/2021 0343   GLUCOSE 85 07/03/2021 0343   BUN 16 07/03/2021 0343   CREATININE 0.77 07/03/2021 0343   CALCIUM 9.4 07/03/2021 0343   PROT 9.3 (H) 06/19/2021 1125   ALBUMIN 4.0 06/19/2021 1125   AST 87 (H) 06/19/2021 1125   ALT 51 (H) 06/19/2021 1125   ALKPHOS 82 06/19/2021 1125   BILITOT 0.5 06/19/2021 1125   GFRNONAA >60 07/03/2021 0343   Lipid Panel  No results found for: CHOL, TRIG, HDL, CHOLHDL, VLDL, LDLCALC, LDLDIRECT No results found for: HGBA1C  Imaging I have reviewed the images obtained:  CT-scan of the brain 2/7: There is no acute intracranial hemorrhage or evidence of acute infarction. ASPECT score is 10.  CT angio head and neck wwo 2/7: - No large vessel occlusion, hemodynamically significant stenosis, or evidence of dissection. - Postoperative changes of recent C3-C7 ACDF. There is significant prevertebral soft tissue swelling.  Foci of gas are present the ventral epidural space at C4-C5; a small collection is not excluded.  Assessment: 61 y.o. male with recent C3-C7 ACDF on 2/3 with acute onset of decreased extremity movement, dysarthria, and subjective complaint of inability to tolerate his secretions this morning. A Code Stroke was called for further evaluation and patient was given a one time dose of Decadron 10 mg prior to CT imaging with some improvement in extremity movement following CT scan.  - Examination revealed patient with decreased mobility of each extremity but without unilateral weakness. He complains of pain with extremity movement and burning paresthesias throughout.  - Imaging reveals patient with significant prevertebral soft tissue swelling and a foci of gas present in the ventral epidural space at C4-5.  - Patient's presentation is felt to be consistent with complications from his cervical decompression and less likely stroke. The Code Stroke was cancelled and patient was transported back to his inpatient room for further neurosurgery evaluation and management.   Recommendations: - Further management per neurosurgery - No further neurology recommendations at this time, please call for further questions or concerns -Cancel code stroke  Pt seen by NP/Neuro and later by MD. Note/plan to be edited by MD as needed.  Anibal Henderson, AGAC-NP [facility] Pager: [phone]  ATTENDING ATTESTATION:  Pt with weakness in all 4 ext s/p cervical spine surgery 4 days ago and pain on movement. CT, CTA neg. Code stroke cancelled. Brisk reflexes points to Cervical spine etiology. Code stroke cancelled. Agree with decadron and C spine MRI imaging.   Dr. Reeves Forth evaluated pt independently, reviewed imaging, chart, labs. Discussed and formulated plan with the APP. Please see APP note above for details.   Total 60 minutes spent on counseling patient and coordinating care, writing notes and reviewing  chart.   Leondro Coryell,MD

## 2021-07-03 NOTE — Significant Event (Signed)
Rapid Response Event Note   Reason for Call : No movement in all extremities   Initial Focused Assessment: Pt lying in bed with eyes open with soft cervical collar in place. PT had C3-C7 ACDF on 06/29/2021. A/O x4 but unable to move extremities with no response to pain Decreased sensation throughout extremities. RRRN called Stroke Response RN for possible stroke (see SRN CODE STROKE documentation). Pt lungs where rhonchi bilaterally with productive cough. Pulses present bilaterally. Skin warm and dry. Abdomen distended and non tender.   VS: BP 154/95, HR 135 ST per EKG, RR 12, O2 88% on RA, T 103 CBG: 83  O2 increased to 94% with 2L [place] applied.  Interventions:  -PT taken to CT for STAT Head and Neck -SRN administered 10mg  Decadron per Neurosurgery  Plan of Care:  -Transfer to 4N ICU -NP to order fluids and foley catheter for tachycardia -MRI of neck   Event Summary:   MD Notified: Viona Gilmore, NP Call TimeIE:[id number] Arrival Time: 0730 End Time: 0900  Fulton Reek, RN

## 2021-07-03 NOTE — Interval H&P Note (Signed)
History and Physical Interval Note:  07/03/2021 1:31 PM  Brian [name]  has presented today for surgery, with the diagnosis of exploration of anterior cervical fusion.  The various methods of treatment have been discussed with the patient and family. After consideration of risks, benefits and other options for treatment, the patient has consented to  Procedure(s): EXPLORATION OF ANTERIOR CERVICAL FUSION (N/A) as a surgical intervention.  The patient's history has been reviewed, patient examined, no change in status, stable for surgery.  I have reviewed the patient's chart and labs.  Questions were answered to the patient's satisfaction.     Kathaleen Maser Ardit Danh

## 2021-07-03 NOTE — Progress Notes (Signed)
Providing Compassionate, Quality Care - Together   Subjective: Nurse reports patient was last seen normal at approximately 3 am. Upon assessment this morning, the patient was having increased difficulty swallowing his secretions and protecting his airway. He was unable to move his arms and legs aside from a flicker. Nursing staff report reaching out to the on-call provider for Neurosurgery, but there is no record of a page in the answering service log for Neurosurgery. A code stroke was called this morning and the Stroke response nurse contacted the attending providers at 7:44 AM. At that time, CT head and neck were ordered and 10 mg IV decadron was given. Stroke was ruled out and CT neck showed some prevertebral swelling. MRI has been ordered.  Objective: Vital signs in last 24 hours: Temp:  [98.8 F (37.1 C)-103 F (39.4 C)] 103 F (39.4 C) (02/07 0724) Pulse Rate:  [104-134] 119 (02/07 0327) Resp:  [14-20] 14 (02/07 0327) BP: (123-154)/(80-106) 154/95 (02/07 0724) SpO2:  [91 %-96 %] 91 % (02/07 0300)  Intake/Output from previous day: 02/06 0701 - 02/07 0700 In: 714.5 [I.V.:714.5] Out: 1100 [Urine:1100] Intake/Output this shift: No intake/output data recorded.  Pt is alert and oriented Speech hoarse Lungs rhonchi Triceps 2/5 left, 1/5 right Biceps 2/5 left 1/5 right Grips 2/5 BUE Left hip flexor 3/5 Right hip flexor 2/5 Left dorsiflexion 3/5, Left plantar flexion 3/5 Right dorsiflexion 1/5, Right plantar flexion 1/5   Lab Results: Recent Labs    07/02/21 0515 07/03/21 0343  WBC 16.3* 11.3*  HGB 11.0* 9.9*  HCT 32.6* 30.0*  PLT 189 231   BMET Recent Labs    07/02/21 0515 07/03/21 0343  NA 133* 135  K 4.5 4.1  CL 100 102  CO2 24 21*  GLUCOSE 97 85  BUN 15 16  CREATININE 0.88 0.77  CALCIUM 9.6 9.4    Studies/Results: CT ANGIO HEAD NECK W WO CM  Result Date: 07/03/2021 CLINICAL DATA:  Neuro deficit, acute, stroke suspected EXAM: CT ANGIOGRAPHY HEAD AND  NECK TECHNIQUE: Multidetector CT imaging of the head and neck was performed using the standard protocol during bolus administration of intravenous contrast. Multiplanar CT image reconstructions and MIPs were obtained to evaluate the vascular anatomy. Carotid stenosis measurements (when applicable) are obtained utilizing NASCET criteria, using the distal internal carotid diameter as the denominator. RADIATION DOSE REDUCTION: This exam was performed according to the departmental dose-optimization program which includes automated exposure control, adjustment of the mA and/or kV according to patient size and/or use of iterative reconstruction technique. CONTRAST:  51mL OMNIPAQUE IOHEXOL 350 MG/ML SOLN COMPARISON:  None. FINDINGS: CTA NECK FINDINGS Aortic arch: Great vessel origins are patent. Right carotid system: Patent.  No stenosis. Left carotid system: Patent.  No stenosis. Vertebral arteries: Patent and codominant.  No stenosis. Skeleton: Postoperative changes of anterior fusion from C3-C7 with plate and screw fixation and interbody allografts. Foci of gas are present in the ventral epidural space at C4-C5. Other neck: There is significant prevertebral soft tissue swelling. Postoperative changes are present in the neck. Upper chest: Emphysema. Review of the MIP images confirms the above findings CTA HEAD FINDINGS Anterior circulation: Intracranial internal carotid arteries are patent with minor calcified plaque. Anterior and middle cerebral arteries are patent. Posterior circulation: Intracranial vertebral arteries are patent. Basilar artery is patent. Major cerebellar artery origins are patent. A left posterior communicating artery is present. Posterior cerebral arteries are patent. Venous sinuses: As permitted by contrast timing, patent. Review of the MIP images confirms  the above findings IMPRESSION: No large vessel occlusion, hemodynamically significant stenosis, or evidence of dissection. Postoperative changes  of recent C3-C7 ACDF. There is significant prevertebral soft tissue swelling. Foci of gas are present the ventral epidural space at C4-C5; a small collection is not excluded. Electronically Signed   By: Macy Mis M.D.   On: 07/03/2021 08:52   DG CHEST PORT 1 VIEW  Result Date: 07/02/2021 CLINICAL DATA:  Fever EXAM: PORTABLE CHEST 1 VIEW COMPARISON:  None. FINDINGS: Cardiac and mediastinal contours within normal limits. Mild right basilar and left perihilar opacities. No large pleural effusion or pneumothorax. IMPRESSION: Mild right basilar and left perihilar opacities, possibly due to atelectasis, although infection or aspiration could appear similar. Electronically Signed   By: Yetta Glassman M.D.   On: 07/02/2021 09:52   CT HEAD CODE STROKE WO CONTRAST`  Result Date: 07/03/2021 CLINICAL DATA:  Code stroke. EXAM: CT HEAD WITHOUT CONTRAST TECHNIQUE: Contiguous axial images were obtained from the base of the skull through the vertex without intravenous contrast. RADIATION DOSE REDUCTION: This exam was performed according to the departmental dose-optimization program which includes automated exposure control, adjustment of the mA and/or kV according to patient size and/or use of iterative reconstruction technique. COMPARISON:  06/19/2021 FINDINGS: Brain: There is no acute intracranial hemorrhage, mass effect, or edema. No new loss of gray-white differentiation. Ventricles and sulci are stable in size and configuration. No extra-axial collection. Vascular: No hyperdense vessel. Skull: Unremarkable. Sinuses/Orbits: No acute abnormality. Other: Mastoid air cells are clear. ASPECTS ([place] Stroke Program Early CT Score) - Ganglionic level infarction (caudate, lentiform nuclei, internal capsule, insula, M1-M3 cortex): 7 - Supraganglionic infarction (M4-M6 cortex): 3 Total score (0-10 with 10 being normal): 10 IMPRESSION: There is no acute intracranial hemorrhage or evidence of acute infarction. ASPECT score  is 10. These results were communicated to Dr. Reeves Forth At 8:15 am on 07/03/2021 by text page via the [facility] messaging system. Electronically Signed   By: Macy Mis M.D.   On: 07/03/2021 08:16    Assessment/Plan: Patient status post C3-4, C4-5, C5-6, C6-7 anterior cervical discectomy with interbody fusion by Dr. Annette Stable on 06/29/2021. Increased difficulty swallowing with lung atelectasis and elevated temperatures on 07/02/2021. Made NPO following MBS by SLP. Order placed for Cortrak. Patient's neuro exam declined overnight and he was found to be quadriparetic this AM. CTA head and neck negative for stroke.   LOS: 4 days   -Place foley catheter -Consult to CCM due to elevated temps, tachycardia, airway management -Transfer to 4N ICU -MRI neck ordered STAT   Viona Gilmore, DNP, AGNP-C Nurse Practitioner  [facility] [place]. [address], [address], [place], [place] [address] P: [phone]     F: [phone]  07/03/2021, 9:19 AM

## 2021-07-03 NOTE — Op Note (Signed)
Date of procedure: 07/03/2021  Date of dictation: Same  Service: Neurosurgery  Preoperative diagnosis: Postoperative epidural hematoma with myelopathy  Postoperative diagnosis: Same  Procedure Name: Reexploration anterior cervical fusion with evacuation of epidural hematoma  Surgeon:Lorella Gomez A.Marna Weniger, M.D.  Asst. Surgeon: None  Anesthesia: General  Indication: 61 year old male approximately 4 days status post 4 level anterior cervical discectomy for treatment of his severe compressive myelopathy.  Initially patient did very well following surgery was up ambulatory and was considering discharge home.  He began to have some feelings of mild increasing weakness 2 days ago with some increased pain.  Last night the patient's symptoms became dramatically worse.  He lost motor strength both upper and lower extremities.  He improved somewhat with steroids and now is antigravity in his strength in both upper and lower extremities.  MRI scanning demonstrates evidence of a large ventral epidural hematoma with severe cord compression.  Patient returns now emergently to the operating room for evacuation of hemorrhage in hopes of improving his symptoms.  Operative note: After induction of anesthesia, patient position supine with neck slightly extended and held placed halter traction.  Patient's anterior cervical region prepped and draped sterilely.  Previous incision was reopened.  Dissection performed on the right side.  The anterior plate was dissected free.  There was some hematoma overlying the plate which was removed.  The plate was disassembled and removed.  Starting inferiorly to superiorly the C6-7 level was inspected first.  The cages 6 7 was removed.  The disc space was explored.  There was dense fibrotic epidural clot which was dissected free and removed.  Care was taken to sweep any residual clot from behind the body of C7 and C6.  After a full evacuation would have been achieved there was no evidence of  any further bleeding.  The dura was well visualized.  The cage was impacted back in place.  The procedure was then repeated at C5-6 again with good decompression then it was repeated at C4-5 and C3-4 again with decompression that was felt to be excellent at all levels.  There was no evidence of any active significant bleeding.  The wound was copiously irrigated.  The cages were all were placed.  The anterior plate was placed over the C3, C4, C5 and C6 and C7 levels.  This then attached with 13 mm fixed angle screws.  All screws had solid purchase.  Locking screws were engaged.  A medium Hemovac drain was left in the prevertebral space.  Wounds then closed in layers with Vicryl sutures.  Vancomycin powder was placed in the deep wound space.  There were no apparent complications.  Patient tolerated the procedure well and he returned to the recovery room postop.

## 2021-07-03 NOTE — Progress Notes (Signed)
Pt has been tachycardiac since early this morning, HR in the 130s and sustaining. Night shift nurse attempts to reach out to on call physicians unsuccessful

## 2021-07-03 NOTE — Progress Notes (Signed)
Pharmacy Antibiotic Note  Brian [name] is a 61 y.o. male admitted on 06/29/2021 with concern for aspiration pneumonia and CNS infection post cervical fusion .  Pharmacy has been consulted for Vancomycin/cefepime dosing.  Scr 0.77 (0.8) mg/dL No previous culture data available Recent surgery>>concern for infection around site with some gas production May have aspirated overnight  Plan: Transition Unasyn to Vancomycin/cefepime/metronidazole Vancomycin 1500mg  x1 followed by vancomycin 1000mg  q12hr (eAUC 494) Cefepime 2gm q8hr Metronidazole 500mg  q8hr (CNS dosing) Plan to obtain levels at steady state if therapy continued Will monitor for acute changes in renal function and adjust as needed F/u cultures results and de-escalate as appropriate  Height: 5\' 10"  (177.8 cm) Weight: 63.5 kg (140 lb) IBW/kg (Calculated) : 73  Temp (24hrs), Avg:99.9 F (37.7 C), Min:98.8 F (37.1 C), Max:103 F (39.4 C)  Recent Labs  Lab 07/02/21 0515 07/03/21 0343  WBC 16.3* 11.3*  CREATININE 0.88 0.77    Estimated Creatinine Clearance: 88.2 mL/min (by C-G formula based on SCr of 0.77 mg/dL).    No Known Allergies  Thank you for allowing pharmacy to be a part of this patients care.  , PharmD Clinical Pharmacist  Please check AMION for all [facility] Pharmacy numbers After 10:00 PM, call Main Pharmacy [phone]

## 2021-07-03 NOTE — H&P (View-Only) (Signed)
Patient with worsening of his situation overnight.  This morning he was apparently unable to move his arms or legs voluntarily.  He remained tachycardic.  His blood pressure was good.  His urine output was minimal.  He was found to be in acute urinary retention and a catheter was placed with good return of urine.  The patient was given a fluid challenge and Decadron and his Strength has improved.  He now has 4-/5 strength in his left upper and lower extremity.  He has 3/5 strength in his right upper and lower extremity.  He denies neck pain.  He is having no airway issues.  His wound is soft and flat.  His airway is midline. ° °Head CT scan is negative for obvious acute stroke.  CT angiogram of his neck and brain is negative for obstructive vascular lesion.  The patient has postoperative change from C3-C7 with good appearance of his bony decompression and no evidence of cage or hardware this position.  There is fluid within the disc spaces laterally which is normal.  There is some question of possible early compressive hematoma at C4-5 although this is not well delineated. ° °Patient with neurologic worsening of unclear etiology.  We will transfer to ICU and continue with fluid resuscitation.  He will be started on antibiotics for possible aspiration pneumonia.  We will continue with catheter drainage for his urinary obstruction.  With regard to his neurologic worsening we will get an MRI scan of his cervical spine to evaluate for compressive lesion. °

## 2021-07-03 NOTE — Progress Notes (Signed)
Patient belongings present on admission: cell phone, cell phone charger, glasses, two belonging bags on clothes, shoes, a belt, wallet. Patient belongings reviewed with patient.

## 2021-07-03 NOTE — Progress Notes (Signed)
Inpatient Rehab Admissions Coordinator:   I checked pt.'s insurance and benefits in an attempt to open a case for CIR admission; however, Pt.'s insurance does not have benefits for either SNF or CIR (AIR/IRF). Pt. In the OR today, so I was not able to notify him or ask if he would wish to be admitted to CIR as a self pay patient. Pt. Will likely need to d/c home with home health or outpatient services once medically stable.   Megan Salon, MS, CCC-SLP Rehab Admissions Coordinator  [phone] (celll) [phone] (office)

## 2021-07-03 NOTE — Brief Op Note (Signed)
06/29/2021 - 07/03/2021  3:36 PM  PATIENT:  Brian [name]  61 y.o. male  PRE-OPERATIVE DIAGNOSIS:  exploration of anterior cervical fusion  POST-OPERATIVE DIAGNOSIS:  * No post-op diagnosis entered *  PROCEDURE:  Procedure(s): EXPLORATION OF ANTERIOR CERVICAL FUSION (N/A)  SURGEON:  Surgeon(s) and Role:    * Julio Sicks, MD - Primary  PHYSICIAN ASSISTANT:   ASSISTANTS: none   ANESTHESIA:   general  EBL:  Minimal   BLOOD ADMINISTERED:none  DRAINS: none   LOCAL MEDICATIONS USED:  NONE  SPECIMEN:  No Specimen  DISPOSITION OF SPECIMEN:  N/A  COUNTS:  YES  TOURNIQUET:  * No tourniquets in log *  DICTATION: .Dragon Dictation  PLAN OF CARE: Admit to inpatient   PATIENT DISPOSITION:  PACU - hemodynamically stable.   Delay start of Pharmacological VTE agent (>24hrs) due to surgical blood loss or risk of bleeding: yes

## 2021-07-03 NOTE — Progress Notes (Signed)
PT Cancellation Note  Patient Details Name: Brian [name] MRN: [id number] DOB: Sep 12, 1960   Cancelled Treatment:    Reason Eval/Treat Not Completed: Medical issues which prohibited therapy. Noted neuro change and transfer to ICU. Will await MRI and any related recommendations from neurosurgery prior to continuing with PT POC.    [name] 07/03/2021, 12:09 PM  Conni Slipper, PT, DPT Acute Rehabilitation Services Pager: [phone] Office: [phone]

## 2021-07-03 NOTE — Code Documentation (Addendum)
Stroke Response Nurse Documentation Code Documentation  JAMESLEY WENMAN is a 61 y.o. male admitted to [facility] on 06/29/21 for ACDF c3-7 with past medical hx of PONV. On No antithrombotic. Code stroke was activated by Rapid Response .   Patient from 5N post op day 4, LSW 0300 when able to move all extremities.  Nightshift RN noticed patient with difficulty moving all extremities. Rapid response called to bedside at shift change who then called SRN for assistance. SRN spoke with Neurosurgery NP Viona Gilmore, orders obtained for 10mg  decadron x1 (given 0803), CT/CTA head and neck.   Stroke team to bedside at CT. NIHSS 18, see documentation for details and code stroke times. Patient with bilateral arm weakness, bilateral leg weakness, bilateral decreased sensation, and dysarthria  on exam.   The following imaging was completed:  CT, CTA head and neck.. Patient is not a candidate for IV Thrombolytic due to recent ACDF. Patient is not a candidate for IR due to no LVO.   Care/Plan: cancel Code Stroke per [place]. Per Tharon Aquas NP: MRI, schedule decadron, transfer to ICU.   Bedside handoff with RN Suezanne Jacquet.    Candace Cruise K  Stroke Response RN

## 2021-07-03 NOTE — Transfer of Care (Signed)
Immediate Anesthesia Transfer of Care Note  Patient: Brian [name]  Procedure(s) Performed: EXPLORATION OF NECK AND REMOVE BLOOD CLOT (Spine Cervical)  Patient Location: PACU  Anesthesia Type:General  Level of Consciousness: awake and alert   Airway & Oxygen Therapy: Patient Spontanous Breathing and Patient connected to face mask oxygen  Post-op Assessment: Report given to RN, Post -op Vital signs reviewed and stable, Patient moving all extremities X 4 and Patient able to stick tongue midline  Post vital signs: Reviewed and stable  Last Vitals:  Vitals Value Taken Time  BP 150/97 07/03/21 1559  Temp    Pulse 133 07/03/21 1604  Resp 18 07/03/21 1604  SpO2 96 % 07/03/21 1604  Vitals shown include unvalidated device data.  Last Pain:  Vitals:   07/03/21 1159  TempSrc: Oral  PainSc:       Patients Stated Pain Goal: 3 (07/02/21 2200)  Complications: No notable events documented.

## 2021-07-04 ENCOUNTER — Inpatient Hospital Stay (HOSPITAL_COMMUNITY): Payer: 59

## 2021-07-04 ENCOUNTER — Encounter (HOSPITAL_COMMUNITY): Payer: Self-pay | Admitting: Neurosurgery

## 2021-07-04 DIAGNOSIS — G959 Disease of spinal cord, unspecified: Secondary | ICD-10-CM

## 2021-07-04 LAB — BASIC METABOLIC PANEL
Anion gap: 10 (ref 5–15)
BUN: 20 mg/dL (ref 6–20)
CO2: 22 mmol/L (ref 22–32)
Calcium: 9.3 mg/dL (ref 8.9–10.3)
Chloride: 102 mmol/L (ref 98–111)
Creatinine, Ser: 0.75 mg/dL (ref 0.61–1.24)
GFR, Estimated: >60 mL/min (ref >60)
Glucose, Bld: 122 mg/dL — ABNORMAL HIGH (ref 70–99)
Potassium: 4 mmol/L (ref 3.5–5.1)
Sodium: 134 mmol/L — ABNORMAL LOW (ref 135–145)

## 2021-07-04 LAB — CBC
HCT: 26.5 % — ABNORMAL LOW (ref 39.0–52.0)
Hemoglobin: 9.1 g/dL — ABNORMAL LOW (ref 13.0–17.0)
MCH: 34 pg (ref 26.0–34.0)
MCHC: 34.3 g/dL (ref 30.0–36.0)
MCV: 98.9 fL (ref 80.0–100.0)
Platelets: 259 10*3/uL (ref 150–400)
RBC: 2.68 MIL/uL — ABNORMAL LOW (ref 4.22–5.81)
RDW: 13.7 % (ref 11.5–15.5)
WBC: 12.9 10*3/uL — ABNORMAL HIGH (ref 4.0–10.5)
nRBC: 0 % (ref 0.0–0.2)

## 2021-07-04 LAB — URINE CULTURE: Culture: NO GROWTH

## 2021-07-04 LAB — GLUCOSE, CAPILLARY
Glucose-Capillary: 131 mg/dL — ABNORMAL HIGH (ref 70–99)
Glucose-Capillary: 153 mg/dL — ABNORMAL HIGH (ref 70–99)

## 2021-07-04 IMAGING — DX DG ABD PORTABLE 1V
1 series · 1 of 1 positions shown · non-contrast
Comparison: None.

CLINICAL DATA: Feeding tube placement

EXAM:
PORTABLE ABDOMEN - 1 VIEW

[abdomen supine]
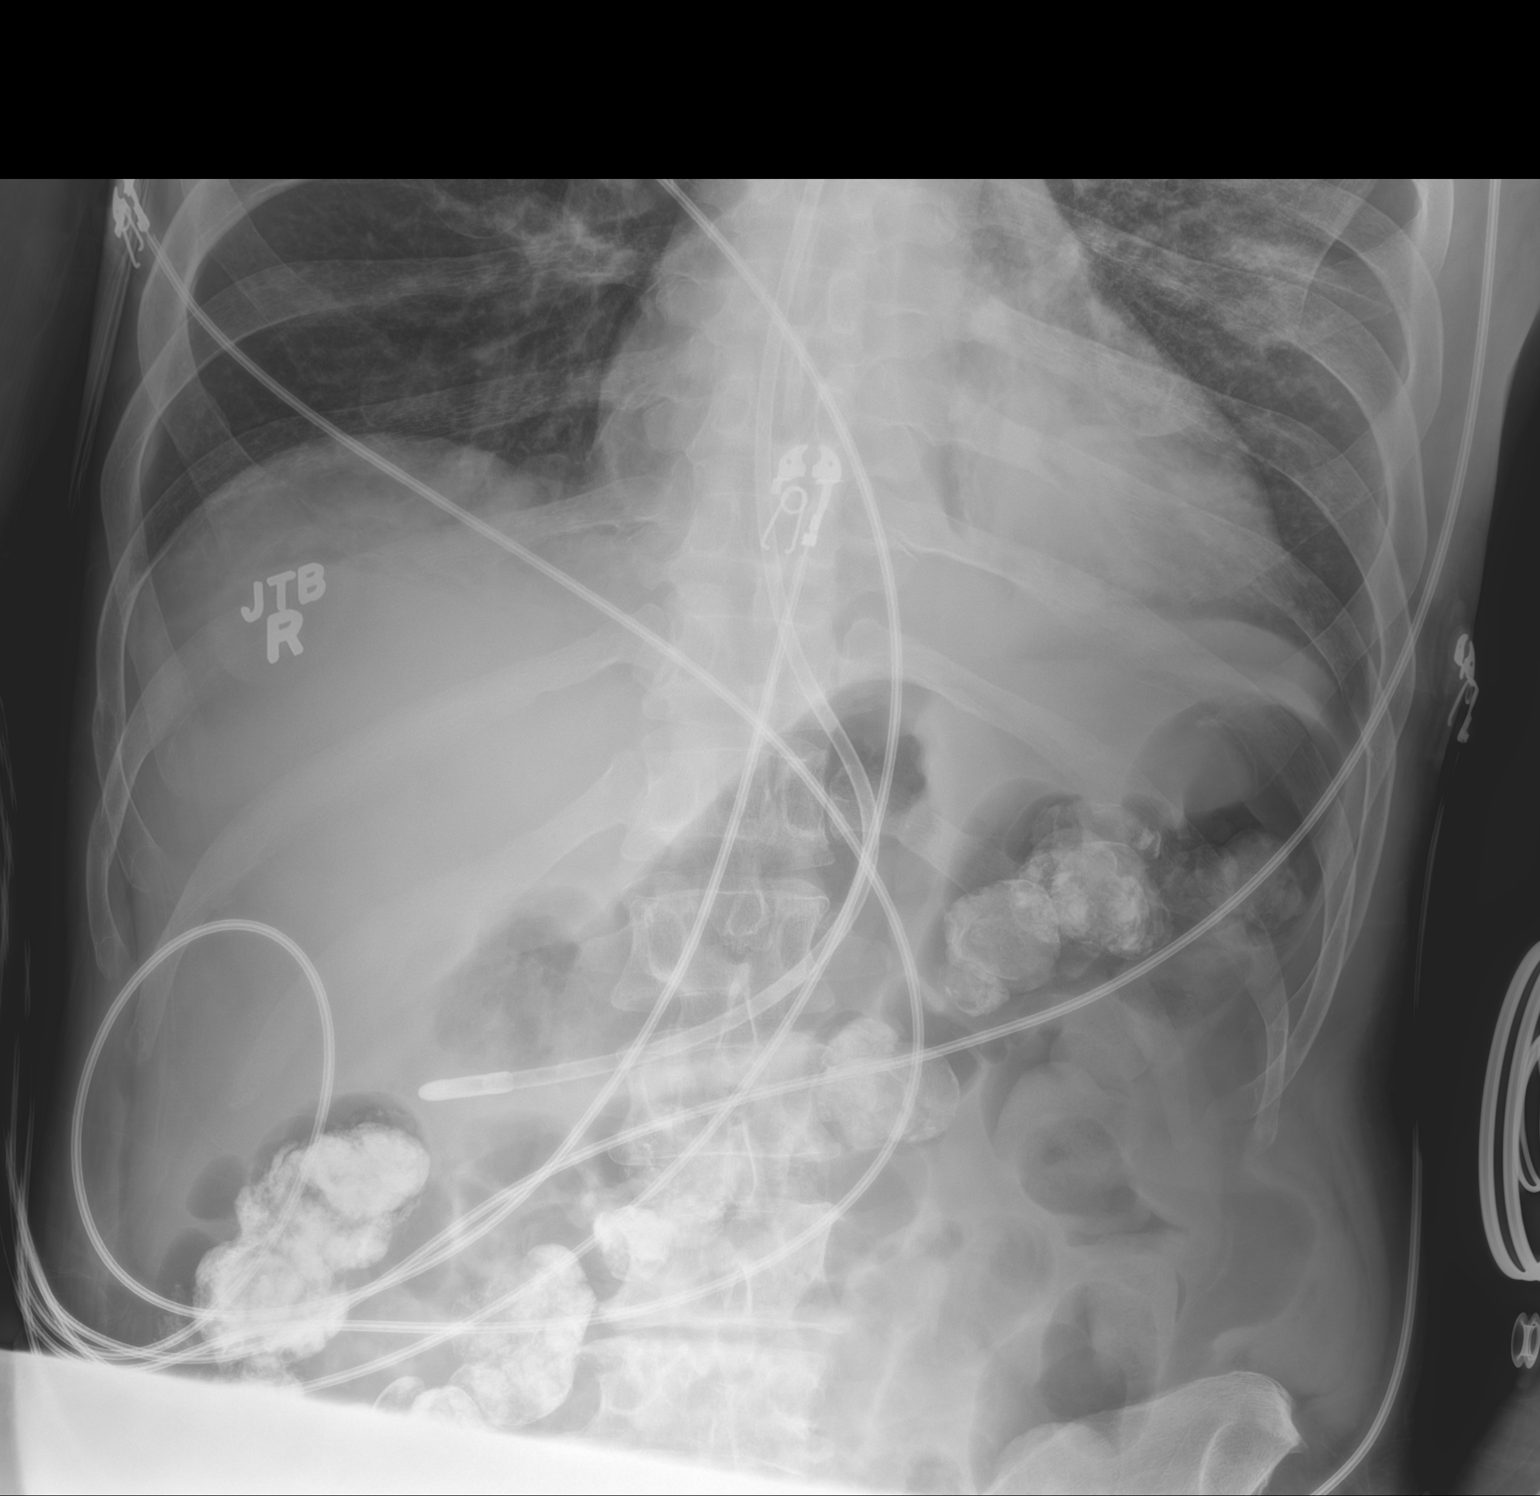

[1 of 1 positions shown; findings below may reference images not displayed]

FINDINGS: Feeding tube terminates in the antropyloric region.

Bilateral airspace disease within both mid and lower lungs. Contrast
within the colon. No free intraperitoneal air.
IMPRESSION: Feeding tube terminating at the antrum/pyloric region.

## 2021-07-04 MED ORDER — SENNOSIDES-DOCUSATE SODIUM 8.6-50 MG PO TABS
1.0000 | ORAL_TABLET | Freq: Two times a day (BID) | ORAL | Status: DC
  Administered 2021-07-04 – 2021-07-14 (×15): 1
  Filled 2021-07-04 (×16): qty 1

## 2021-07-04 MED ORDER — WHITE PETROLATUM EX OINT
TOPICAL_OINTMENT | CUTANEOUS | Status: DC | PRN
  Administered 2021-08-10 – 2021-08-12 (×2): 1 via TOPICAL
  Filled 2021-07-04 (×4): qty 28.35

## 2021-07-04 MED ORDER — PROSOURCE TF PO LIQD
45.0000 mL | Freq: Every day | ORAL | Status: DC
  Administered 2021-07-04 – 2021-07-05 (×2): 45 mL
  Filled 2021-07-04 (×2): qty 45

## 2021-07-04 MED ORDER — SODIUM CHLORIDE 0.9 % IV SOLN
3.0000 g | Freq: Three times a day (TID) | INTRAVENOUS | Status: DC
  Administered 2021-07-04 – 2021-07-07 (×9): 3 g via INTRAVENOUS
  Filled 2021-07-04 (×11): qty 8

## 2021-07-04 MED ORDER — OSMOLITE 1.5 CAL PO LIQD
1000.0000 mL | ORAL | Status: DC
  Administered 2021-07-04 – 2021-07-06 (×3): 1000 mL
  Filled 2021-07-04: qty 1000

## 2021-07-04 NOTE — Procedures (Signed)
Cortrak  Tube Type:  Cortrak - 43 inches Tube Location:  Left nare Initial Placement:  Stomach Secured by: Bridle Technique Used to Measure Tube Placement:  Marking at nare/corner of mouth Cortrak Secured At:  65 cm  Cortrak Tube Team Note:  Consult received to place a Cortrak feeding tube.   X-ray is required, abdominal x-ray has been ordered by the Cortrak team. Please confirm tube placement before using the Cortrak tube.   If the tube becomes dislodged please keep the tube and contact the Cortrak team at [email or web address] ([email or web address]) for replacement.  If after hours and replacement cannot be delayed, place a NG tube and confirm placement with an abdominal x-ray.    Lulubelle Simcoe MS, RD, LDN Please refer to AMION for RD and/or RD on-call/weekend/after hours pager   

## 2021-07-04 NOTE — Progress Notes (Signed)
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 No significant PMH. 2/8: Status post reexploration of anterior cervical fusion and removal of epidural hematoma.   OT comments  Patient s/p reexploration of ACDF.  Deficits impacting independence are listed below.  Patient is exhibiting poor postural control, poor muscle control, and an inability to achieve a full stand this date.  Patient is very motivated, but is fearful of falling, and can self limit at times.  OT to continue efforts in the acute setting, he is hoping to return home with PRN assist from a neighbor, but currently post acute rehab is needed prior to retuning home.  Patient may have insurance issues, so patient's frequency may need to be daily.     Recommendations for follow up therapy are one component of a multi-disciplinary discharge planning process, led by the attending physician.  Recommendations may be updated based on patient status, additional functional criteria and insurance authorization.    Follow Up Recommendations  Skilled nursing-short term rehab (<3 hours/day)    Assistance Recommended at Discharge Frequent or constant Supervision/Assistance  Patient can return home with the following  Two people to help with walking and/or transfers;A lot of help with bathing/dressing/bathroom;Direct supervision/assist for medications management;Assist for transportation;Help with stairs or ramp for entrance;Assistance with feeding   Equipment Recommendations  Wheelchair (measurements OT);Wheelchair cushion (measurements OT)    Recommendations for Other Services      Precautions / Restrictions Precautions Precautions: Cervical;Fall Required Braces or Orthoses: Cervical  Brace Cervical Brace: Soft collar;At all times Restrictions Weight Bearing Restrictions: No       Mobility Bed Mobility Overal bed mobility: Needs Assistance Bed Mobility: Sidelying to Sit, Sit to Supine   Sidelying to sit: Mod assist   Sit to supine: Max assist        Transfers Overall transfer level: Needs assistance   Transfers: Sit to/from Stand Sit to Stand: Max assist           General transfer comment: unable to achieve a full stand.     Balance Overall balance assessment: Needs assistance Sitting-balance support: Feet supported, No upper extremity supported Sitting balance-Leahy Scale: Zero   Postural control: Posterior lean, Right lateral lean, Left lateral lean Standing balance support: Bilateral upper extremity supported Standing balance-Leahy Scale: Zero                             ADL either performed or assessed with clinical judgement   ADL Overall ADL's : Needs assistance/impaired Eating/Feeding: Maximal assistance;Bed level   Grooming: Maximal assistance;Bed level   Upper Body Bathing: Maximal assistance;Bed level   Lower Body Bathing: Total assistance;Bed level   Upper Body Dressing : Maximal assistance;Bed level   Lower Body Dressing: Total assistance;Bed level                      Extremity/Trunk Assessment Upper Extremity Assessment Upper Extremity Assessment: Generalized weakness RUE Deficits / Details: shoulder 2-/5 strength; elbow 2-/5 strength, wrist and hand 2 to 2-/5; digits 2-/5 RUE Sensation: decreased light touch RUE Coordination: decreased fine motor;decreased gross motor LUE Deficits / Details: shoulder 2-/5 strength; elbow 2-/5 strength, wrist and hand 2 to 2-/5; digits 2-/5 LUE Sensation: decreased light touch  LUE Coordination: decreased fine motor;decreased gross motor   Lower Extremity Assessment Lower Extremity Assessment: Defer to PT evaluation   Cervical / Trunk Assessment Cervical /  Trunk Assessment: Neck Surgery    Vision Baseline Vision/History: 0 No visual deficits Patient Visual Report: No change from baseline     Perception Perception Perception: Not tested   Praxis Praxis Praxis: Not tested    Cognition Arousal/Alertness: Awake/alert Behavior During Therapy: Flat affect Overall Cognitive Status: Within Functional Limits for tasks assessed                                          Exercises      Shoulder Instructions       General Comments      Pertinent Vitals/ Pain       Pain Assessment Faces Pain Scale: Hurts a little bit Pain Location: Neck Pain Descriptors / Indicators: Tightness, Sore Pain Intervention(s): Monitored during session, Premedicated before session                                                          Frequency  Min 2X/week        Progress Toward Goals  OT Goals(current goals can now be found in the care plan section)  Progress towards OT goals: Not progressing toward goals - comment (s/p procedure with re-assessment)  Acute Rehab OT Goals Patient Stated Goal: Get stronger OT Goal Formulation: With patient Time For Goal Achievement: 07/18/21 Potential to Achieve Goals: Fair ADL Goals Pt Will Perform Eating: with min assist;with adaptive utensils;sitting Pt Will Perform Grooming: with min assist;sitting Pt Will Transfer to Toilet: with min assist;stand pivot transfer;bedside commode Pt/caregiver will Perform Home Exercise Program: Increased strength;Both right and left upper extremity;With theraputty;With minimal assist;With written HEP provided  Plan Discharge plan needs to be updated    Co-evaluation                 AM-PAC OT "6 Clicks" Daily Activity     Outcome Measure   Help from another person eating meals?: A Lot Help from another person taking care of personal grooming?: A Lot Help from another person toileting, which includes using toliet,  bedpan, or urinal?: Total Help from another person bathing (including washing, rinsing, drying)?: A Lot Help from another person to put on and taking off regular upper body clothing?: A Lot Help from another person to put on and taking off regular lower body clothing?: Total 6 Click Score: 10    End of Session Equipment Utilized During Treatment: Cervical collar  OT Visit Diagnosis: Unsteadiness on feet (R26.81);Muscle weakness (generalized) (M62.81);Ataxia, unspecified (R27.0);Feeding difficulties (R63.3)   Activity Tolerance Patient tolerated treatment well   Patient Left in bed;with call bell/phone within reach   Nurse Communication          Time: 1043-1110 OT Time Calculation (min): 27 min  Charges: OT General Charges $OT Visit: 1 Visit OT Treatments $Self Care/Home Management : 8-22 mins $Therapeutic Activity: 8-22 mins  07/04/2021  RP, OTR/L  Acute Rehabilitation Services  Office:  [phone]   Suzanna Obey 07/04/2021, 12:22 PM

## 2021-07-04 NOTE — Progress Notes (Signed)
Speech Language Pathology Treatment: Dysphagia  Patient Details Name: Brian [name] MRN: [id number] DOB: [birth year] Today's Date: 07/04/2021 Time: 7867-6720 SLP Time Calculation (min) (ACUTE ONLY): 18 min  Assessment / Plan / Recommendation Clinical Impression  Pt seen after MBS Monday, reviewed results and recommendations for NPO and trials of ice with ST to promote use of oropharyngeal musculature. He seems to be improved from reports heard yesterday and transferring to ICU. Pt is receiving steroids to assist with pharyngeal edema. On arrival, his vocal quality is mildly wet and appears to be managing secretions and is not clearing his throat frequently as before.  He needed full assist to administer small ice chips and given verbal feedback to swallow hard and effortful. Immediate and delayed throat clears and one delayed cough. Assisted to suction due to increased amount but pt encouraged to swallow secretions as able.  Time is best prognosticator for swallow recovery. At this point therapists plans to check back on pt Monday to determine when next MBS is appropriate. Please reach out if needed prior.    HPI HPI: 61 y/o admitted 06/29/21 following C3-7 ACDF after progressive cervical myelopathy symptoms with subsequent fall and severe incomplete SCI. No significant PMH.      SLP Plan  Continue with current plan of care      Recommendations for follow up therapy are one component of a multi-disciplinary discharge planning process, led by the attending physician.  Recommendations may be updated based on patient status, additional functional criteria and insurance authorization.    Recommendations  Diet recommendations: NPO Medication Administration: Via alternative means                Oral Care Recommendations: Oral care BID Follow Up Recommendations: Skilled nursing-short term rehab (<3 hours/day) Assistance recommended at discharge: Set up Supervision/Assistance SLP Visit  Diagnosis: Dysphagia, pharyngeal phase (R13.13) Plan: Continue with current plan of care           [name]  07/04/2021, 11:13 AM

## 2021-07-04 NOTE — Progress Notes (Signed)
Patient looks much better today.  Minimal pain.  Seems to be swallowing little bit better.  His voice is strong.  Upper extremity and lower extremity strength much improved.  Still little bit weaker on the right side than the left.  Left side with 4+ to 5/5 strength.  Right side with 4 to 4+/5 strength still with some diminished dexterity in his right hand.  Wound clean and dry.  Drain output low.  Status post reexploration of anterior cervical fusion and removal of epidural hematoma.  Patient progressing well.  Restart therapy and efforts at rehab today.

## 2021-07-04 NOTE — Progress Notes (Signed)
Physical Therapy Treatment Patient Details Name: Brian [name] MRN: [id number] DOB: July 08, 1960 Today's Date: 07/04/2021   History of Present Illness Pt is a 61 y/o male admitted 06/29/21 following C3-7 ACDF after progressive cervical myelopathy symptoms with subsequent fall and severe incomplete SCI. No significant PMH. 2/8: Status post reexploration of anterior cervical fusion and removal of epidural hematoma.    PT Comments    Patient progressing well this session.  Able to balance on his own at EOB with intermittent support for safety.  Also able to stand with+2 A and take steps with RW to chair.  Discussed d/c plan and pt hopeful for continued improvement and for home with intermittent help from his neighbor.  Educated him we would still recommend short AIR stay prior to home even knowing he would have to be self pay.  PT will continue to follow acutely.    Recommendations for follow up therapy are one component of a multi-disciplinary discharge planning process, led by the attending physician.  Recommendations may be updated based on patient status, additional functional criteria and insurance authorization.  Follow Up Recommendations  Acute inpatient rehab (3hours/day)     Assistance Recommended at Discharge Frequent or constant Supervision/Assistance  Patient can return home with the following A lot of help with walking and/or transfers   Equipment Recommendations  Rolling walker (2 wheels)    Recommendations for Other Services       Precautions / Restrictions Precautions Precautions: Fall;Cervical Required Braces or Orthoses: Cervical Brace Cervical Brace: Soft collar;At all times Restrictions Weight Bearing Restrictions: No     Mobility  Bed Mobility Overal bed mobility: Needs Assistance Bed Mobility: Rolling, Sidelying to Sit Rolling: Mod assist Sidelying to sit: Max assist       General bed mobility comments: cues for technique, assist to turn shoulders and  lift trunk, move legs off bed    Transfers Overall transfer level: Needs assistance Equipment used: Rolling walker (2 wheels) Transfers: Sit to/from Stand, Bed to chair/wheelchair/BSC Sit to Stand: Mod assist, +2 physical assistance, From elevated surface Stand pivot transfers: Mod assist, +2 physical assistance              Ambulation/Gait Ambulation/Gait assistance: +2 physical assistance Gait Distance (Feet): 2 Feet Assistive device: Rolling walker (2 wheels), 2 person hand held assist, 1 person hand held assist, None Gait Pattern/deviations: Decreased stride length, Step-to pattern, Ataxic, Knees buckling, Decreased dorsiflexion - left, Decreased dorsiflexion - right, Shuffle, Trendelenburg       General Gait Details: step pivot to bed with +2 for safety, up with foot placement and pressure through feet for propioceptive feedback and pt up from elevated surface with some lifting help; stand step to chair able to pick up feet and place, but noted difficulty stepping back so chair brought under him                          =   Stairs             Wheelchair Mobility    Modified Rankin (Stroke Patients Only)       Balance Overall balance assessment: Needs assistance Sitting-balance support: Feet supported Sitting balance-Leahy Scale: Poor Sitting balance - Comments: able to balance EOB wtih S or light CGA for safety as pt moving fwd and back for finding balance   Standing balance support: Bilateral upper extremity supported Standing balance-Leahy Scale: Zero Standing balance comment: mod A for safety with standing balance  with RW for support                            Cognition Arousal/Alertness: Awake/alert Behavior During Therapy: WFL for tasks assessed/performed Overall Cognitive Status: Within Functional Limits for tasks assessed                                          Exercises      General Comments General comments  (skin integrity, edema, etc.): HR up to 135 with activity      Pertinent Vitals/Pain Pain Assessment Pain Assessment: No/denies pain    Home Living                          Prior Function            PT Goals (current goals can now be found in the care plan section) Progress towards PT goals: Progressing toward goals    Frequency    Min 5X/week      PT Plan Discharge plan needs to be updated;Equipment recommendations need to be updated    Co-evaluation              AM-PAC PT "6 Clicks" Mobility   Outcome Measure  Help needed turning from your back to your side while in a flat bed without using bedrails?: A Lot Help needed moving from lying on your back to sitting on the side of a flat bed without using bedrails?: A Lot Help needed moving to and from a bed to a chair (including a wheelchair)?: Total Help needed standing up from a chair using your arms (e.g., wheelchair or bedside chair)?: Total Help needed to walk in hospital room?: Total Help needed climbing 3-5 steps with a railing? : Total 6 Click Score: 8    End of Session Equipment Utilized During Treatment: Gait belt;Cervical collar Activity Tolerance: Patient tolerated treatment well Patient left: in chair;with call bell/phone within reach Nurse Communication: Mobility status PT Visit Diagnosis: Unsteadiness on feet (R26.81);History of falling (Z91.81);Ataxic gait (R26.0);Muscle weakness (generalized) (M62.81);Other symptoms and signs involving the nervous system (R29.898);Difficulty in walking, not elsewhere classified (R26.2)     Time: [id number] PT Time Calculation (min) (ACUTE ONLY): 24 min  Charges:  $Therapeutic Activity: 23-37 mins                     Magda Kiel, PT Acute Rehabilitation Services [id number] [phone] 07/04/2021    Reginia Naas 07/04/2021, 3:40 PM

## 2021-07-04 NOTE — Progress Notes (Signed)
Inpatient Rehab Admissions Coordinator:   I notified Pt. That he does not have insurance benefits for AIR/IRF. Cost of self pay admission reviewed and he states he cannot afford it and is not interested. CIR to sign off.   Megan Salon, MS, CCC-SLP Rehab Admissions Coordinator  [phone] (celll) [phone] (office)

## 2021-07-04 NOTE — Progress Notes (Signed)
NAME:  Brian [name], MRN:  [id number], DOB:  07/23/1960, LOS: 5 ADMISSION DATE:  06/29/2021, CONSULTATION DATE:  07/03/21 REFERRING MD:  Doran Durand, NP, CHIEF COMPLAINT:  AMS   History of Present Illness:   61 year old male with prior history of tobacco abuse and ETOH use who recently was found to have critical multilevel cervical spinal stenosis C3-6 with spinal cord compression and spinal cord signal change after recent fall with progressive upper and lower extremity weakness and tremors.  He was admitted on 2/3 and underwent ACDF of C3-4, C4-5, C5-5, and C6-7.  Post-operatively he was progressing slowly with residual weakness in both hands but improving lower extremity strength and function.  He developed some difficulty swallowing on 2/5 and SLP ordered and placed on thickened liquids.   Overnight 2/6, patient with increased difficulty swallowing and was made NPO but also noted to have dysarthria and difficulty moving extremities with numbness.  SLP evaluated and found to be moderate aspiration risk.   Also progressively tachycardic, tachypneic, and now febrile.  Code stroke activated and taken for CT/ CTA head and neck and was given decadron 10mg  once.  NIHSS 18.  CTH was negative for acute findings, and CTA head neck did not reveal any LVO but noted significant prevertebral soft tissue swelling with foci of gas present at the ventral epidural space at C4-5, small collection not excluded.  On 2/7, patient with worsening confusion and concern for airway involvement, PCCM consulted for further evaluation.   Pertinent  Medical History  Tobacco abuse, cervical stenosis, ETOH use   Significant Hospital Events: Including procedures, antibiotic start and stop dates in addition to other pertinent events   2/3 ACDF C3-4, C4-5, C5-5, and C6-7 w/ Dr. 4/7 2/6 SLP eval for difficulty swallowing 2/7 Code stroke overnight, neg for LVO, CT showing soft tissue swelling.  PCCM consulted for concern of airway  management and AMS  Images  2/7 CT angio head and neck > No large vessel occlusion, hemodynamically significant stenosis, or evidence of dissection. Significant prevertebral soft tissue swelling. Foci of gas are present the ventral epidural space at C4-C5; a small collection is not excluded.  Interim History / Subjective:  Feeling much better today. Has some productive cough.  Objective   Blood pressure 136/84, pulse 94, temperature 98.4 F (36.9 C), temperature source Oral, resp. rate 14, height 5\' 10"  (1.778 m), weight 63.5 kg, SpO2 100 %.        Intake/Output Summary (Last 24 hours) at 07/04/2021 0751 Last data filed at 07/04/2021 0747 Gross per 24 hour  Intake 3083.32 ml  Output 4128 ml  Net -1044.68 ml   Filed Weights   06/28/21 1135 06/29/21 0554  Weight: 61.7 kg 63.5 kg   Examination: General appearance: 61 y.o., male, NAD, conversant  Eyes: PERRL, tracking appropriately HENT: NCAT; MMM Neck: c collar in place Lungs: a little rhonchorous clearing with cough, with normal respiratory effort CV: tachy, RR, no murmur  Abdomen: Soft, non-tender; non-distended, BS hypoactive Extremities: No peripheral edema, warm Skin: Normal turgor and texture; no rash Psych: Appropriate affect Neuro: Alert and oriented to person and place, improving strength all 4 extremities  MRSA nare negative BCx pending WBCs pretty stable   Resolved Hospital Problem list    Assessment & Plan:   Sepsis secondary to aspiration pneumonia  P: De-escalate ABX to unasyn to complete 5 day course Flutter valve 10 puffs BID  Supplemental oxygen as needed for sat goal > 92 Speech following, aspiration  precautions  Severe cervical spinal stenosis with spinal cord compression s/p ACDF C3-C7 on 2/3 c/b epidural hematoma s/p evacuation 2/7 Exam drastically improved s/p evacuation.  P: NSGY evaluation Speech evaluation, may need cortrak Aspiration precautions   Daily tobacco use  Occasional ETOH  use P: Monitor for signs of withdrawal  Tobacco cessation education when appropriate   Will sign off but glad to be reinvolved as condition changes  Best Practice (right click and "Reselect all SmartList Selections" daily)   Diet/type: NPO DVT prophylaxis: other Per Primary  GI prophylaxis: N/A Lines: N/A Foley:  Yes, and it is still needed Code Status:  full code Last date of multidisciplinary goals of care discussion: Per Primary   Critical care time: n/a   07/04/2021, 7:51 AM

## 2021-07-04 NOTE — Progress Notes (Signed)
°  Transition of Care [facility]) Screening Note   Patient Details  Name: Brian [name] Date of Birth: 02/13/1961   Transition of Care [facility]) CM/SW Contact:    Benard Halsted, LCSW Phone Number: 07/04/2021, 9:52 AM    Patient now on 4N ICU. Transition of Care Department [facility]) has reviewed patient. We will continue to monitor patient advancement through interdisciplinary progression rounds. If new patient transition needs arise, please place a TOC consult.

## 2021-07-04 NOTE — Progress Notes (Signed)
Initial Nutrition Assessment  DOCUMENTATION CODES:  Non-severe (moderate) malnutrition in context of social or environmental circumstances  INTERVENTION:  Continue NPO until cleared for diet by SLP Initiate TF via cortrak tube. Recommend the following: Osmolite 1.5 at goal of 26mL/h. Start at 47mL and advance by 56mL q6h to goal. Prosource TF 1 packet/d (11g of protein and 40kcal/packet) Standard free water flush of 10mL q4h Regimen provides 2020kcal, 94g of protein, 1162mL of free water (flush+TF) MVI with minerals daily via tube  NUTRITION DIAGNOSIS:  Moderate Malnutrition (in the context of social/environmental circumstances) related to  (inadequate energy intake) as evidenced by mild fat depletion, moderate muscle depletion, severe muscle depletion.  GOAL:  Patient will meet greater than or equal to 90% of their needs  MONITOR:  TF tolerance, Diet advancement, Labs  REASON FOR ASSESSMENT:  Other (Comment) (New cortrak)    ASSESSMENT:  61 year old male with hx EtOH abuse, tobacco use, and spinal stenosis initially presented 2/3 for planned multilevel anterior cervical decompression and fusion surgery after experiencing progressive bilateral upper and lower extremity weakness and spasticity due to critical multilevel cervical spinal stenosis with spinal cord compression and signal change. Post-op recovery was complicated and was ultimately taken back to OR 2/7  2/3 - Op, anterior cervical discectomy with interbody fusion  2/5 - pt initially complained of worsening swallowing function 2/6 - MBS, NPO after procedure per SLP 2/7 - code stroke activated (negative) but transferred to ICU 2/7 - Op, Reexploration anterior cervical fusion with evacuation of epidural hematoma 2/8 - Cortrak tube placed  Pt resting in bed at the time of assessment, recently had cortrak tube placed. Pt reports feeling much improved today, able to move his arms and legs today after his repeat surgery  yesterday.   Pt reports that until the last few days when he developed the difficulty swallowing, his intake was normal. Pt reports that for breakfast, he usually has an ensure, for lunch, he has an ensure and a bologna sandwich, and dinner he will have something like a part of a rotisserie chicken and a side from the grocery store. Pt states his weight stays around 136 lbs. Based on intake, pt seems to be chronically under-consuming calories/protein.  Muscle and fat deficits seen through body on exam.  Discussed with pt the reason for the cortrak tube and addressed all questions about nutrition plan. As pt has not had any intake in 4 days, will advance slowly in order to monitor tolerance. Discussed with RN.   Average Meal Intake: 2/5: 75% intake x 1 recorded meal this admission  Nutritionally Relevant Medications: Scheduled Meds:  dexamethasone injection  4 mg Intravenous Q6H   senna-docusate  1 tablet Oral BID   Continuous Infusions:  sodium chloride 50 mL/hr at 07/04/21 1000   ampicillin-sulbactam (UNASYN) IV     PRN Meds: bisacodyl, phenol, ondansetron, sodium phosphate  Labs Reviewed: Sodium 134   NUTRITION - FOCUSED PHYSICAL EXAM: Flowsheet Row Most Recent Value  Orbital Region Mild depletion  Upper Arm Region Mild depletion  Thoracic and Lumbar Region Mild depletion  Buccal Region Mild depletion  Temple Region Moderate depletion  Clavicle Bone Region Mild depletion  Clavicle and Acromion Bone Region Severe depletion  Scapular Bone Region Mild depletion  Dorsal Hand Severe depletion  Patellar Region Moderate depletion  Anterior Thigh Region Moderate depletion  Posterior Calf Region Mild depletion  Edema (RD Assessment) None  Hair Reviewed  Eyes Reviewed  Mouth Reviewed  Skin Reviewed  Nails Reviewed  Diet Order:   Diet Order             Diet NPO time specified  Diet effective now                   EDUCATION NEEDS:  Education needs have been  addressed  Skin:  Skin Assessment: Reviewed RN Assessment (surgical incisions to the neck)  Last BM:  2/6  Height:  Ht Readings from Last 1 Encounters:  06/29/21 5\' 10"  (1.778 m)    Weight:  Wt Readings from Last 1 Encounters:  06/29/21 63.5 kg    Ideal Body Weight:  78.2 kg  BMI:  Body mass index is 20.09 kg/m.  Estimated Nutritional Needs:  Kcal:  1800-2000 kcal/d Protein:  90-105 g/d Fluid:  > 2L/d   Ranell Patrick, RD, LDN Clinical Dietitian RD pager # available in [place]  After hours/weekend pager # available in [facility]

## 2021-07-05 DIAGNOSIS — G959 Disease of spinal cord, unspecified: Secondary | ICD-10-CM | POA: Diagnosis not present

## 2021-07-05 DIAGNOSIS — E43 Unspecified severe protein-calorie malnutrition: Secondary | ICD-10-CM | POA: Insufficient documentation

## 2021-07-05 DIAGNOSIS — E440 Moderate protein-calorie malnutrition: Secondary | ICD-10-CM | POA: Insufficient documentation

## 2021-07-05 LAB — GLUCOSE, CAPILLARY
Glucose-Capillary: 179 mg/dL — ABNORMAL HIGH (ref 70–99)
Glucose-Capillary: 143 mg/dL — ABNORMAL HIGH (ref 70–99)
Glucose-Capillary: 161 mg/dL — ABNORMAL HIGH (ref 70–99)
Glucose-Capillary: 171 mg/dL — ABNORMAL HIGH (ref 70–99)
Glucose-Capillary: 158 mg/dL — ABNORMAL HIGH (ref 70–99)
Glucose-Capillary: 149 mg/dL — ABNORMAL HIGH (ref 70–99)

## 2021-07-05 LAB — CBC
HCT: 27 % — ABNORMAL LOW (ref 39.0–52.0)
Hemoglobin: 9.2 g/dL — ABNORMAL LOW (ref 13.0–17.0)
MCH: 33.8 pg (ref 26.0–34.0)
MCHC: 34.1 g/dL (ref 30.0–36.0)
MCV: 99.3 fL (ref 80.0–100.0)
Platelets: 286 10*3/uL (ref 150–400)
RBC: 2.72 MIL/uL — ABNORMAL LOW (ref 4.22–5.81)
RDW: 13.7 % (ref 11.5–15.5)
WBC: 10.4 10*3/uL (ref 4.0–10.5)
nRBC: 0 % (ref 0.0–0.2)

## 2021-07-05 LAB — BASIC METABOLIC PANEL
Anion gap: 7 (ref 5–15)
BUN: 26 mg/dL — ABNORMAL HIGH (ref 6–20)
CO2: 23 mmol/L (ref 22–32)
Calcium: 9.3 mg/dL (ref 8.9–10.3)
Chloride: 107 mmol/L (ref 98–111)
Creatinine, Ser: 0.67 mg/dL (ref 0.61–1.24)
GFR, Estimated: >60 mL/min (ref >60)
Glucose, Bld: 166 mg/dL — ABNORMAL HIGH (ref 70–99)
Potassium: 4 mmol/L (ref 3.5–5.1)
Sodium: 137 mmol/L (ref 135–145)

## 2021-07-05 LAB — HEMOGLOBIN A1C
Hgb A1c MFr Bld: 5.2 % (ref 4.8–5.6)
Mean Plasma Glucose: 102.54 mg/dL

## 2021-07-05 MED ORDER — DEXAMETHASONE SODIUM PHOSPHATE 4 MG/ML IJ SOLN
4.0000 mg | Freq: Four times a day (QID) | INTRAMUSCULAR | Status: DC
  Administered 2021-07-05 – 2021-07-06 (×4): 4 mg via INTRAVENOUS
  Filled 2021-07-05 (×4): qty 1

## 2021-07-05 MED ORDER — INSULIN ASPART 100 UNIT/ML IJ SOLN
0.0000 [IU] | INTRAMUSCULAR | Status: DC
  Administered 2021-07-05 (×2): 2 [IU] via SUBCUTANEOUS
  Administered 2021-07-05 – 2021-07-06 (×2): 1 [IU] via SUBCUTANEOUS

## 2021-07-05 NOTE — Progress Notes (Signed)
Pt is complaining of stomach pain and is claiming it to be due to the medication that he is taking, mixed with him not being able to eat. Would benefit from someone explaining the situation to him. RT will continue to monitor as needed.

## 2021-07-05 NOTE — Progress Notes (Addendum)
PROGRESS NOTE  Brian [name] ZOX:[id number] DOB: March 24, 1961 DOA: 06/29/2021 PCP: Pcp, No  HPI/Recap of past 3724 hours:  61 year old male with prior history of tobacco abuse and ETOH use who recently was found to have critical multilevel cervical spinal stenosis C3-6 with spinal cord compression and spinal cord signal change after recent fall with progressive upper and lower extremity weakness and tremors.  He was admitted on 2/3 and underwent ACDF of C3-4, C4-5, C5-5, and C6-7.  Post-operatively he was progressing slowly with residual weakness in both hands but improving lower extremity strength and function.  He developed some difficulty swallowing on 2/5 and SLP ordered and placed on thickened liquids.   Overnight 2/6, patient with increased difficulty swallowing and was made NPO but also noted to have dysarthria and difficulty moving extremities with numbness.  SLP evaluated and found to be moderate aspiration risk.   Also progressively tachycardic, tachypneic, and now febrile.  Code stroke activated and taken for CT/ CTA head and neck and was given decadron 10mg  once.  NIHSS 18.  CTH was negative for acute findings, and CTA head neck did not reveal any LVO but noted significant prevertebral soft tissue swelling with foci of gas present at the ventral epidural space at C4-5, small collection not excluded.  On 2/7, patient with worsening confusion and concern for airway involvement, PCCM consulted for further evaluation.   07/05/2021: Seen at his bedside.  Reports a nonproductive cough.  On IV antibiotics empirically for aspiration pneumonia.  Assessment/Plan: Principal Problem:   Cervical myelopathy (HCC) Active Problems:   Malnutrition of moderate degree  Sepsis secondary to aspiration pneumonia  P: De-escalate ABX to unasyn to complete 5 day course Flutter valve 10 puffs BID  Supplemental oxygen as needed for sat goal > 92 Speech following Continue aspiration precautions   Severe  cervical spinal stenosis with spinal cord compression s/p ACDF C3-C7 on 2/3 c/b epidural hematoma s/p evacuation 2/7 Exam drastically improved s/p evacuation.  P: NSGY evaluation Speech evaluation, may need cortrak Aspiration precautions    Daily tobacco use  Occasional ETOH use P: Monitor for signs of withdrawal  Tobacco cessation education when appropriate   Physical debility Continue fall precautions PT OT assessment recommending SNF CSW assisting with SNF placement.  Highly appreciated.  Severe protein calorie malnutrition BMI 18 Severe muscle mass loss Continue oral supplements  Hyperglycemia likely secondary to ongoing steroid use Obtain hemoglobin A1c Start sensitive insulin sliding scale Avoid hypoglycemia   Code Status: Full code  Family Communication: None at bedside  Disposition Plan: Likely will discharge to SNF.  Thank you for allowing us to participate in the care of this patient.  We will continue to follow along with you.       Objective: Vitals:   07/05/21 0500 07/05/21 0743 07/05/21 1140 07/05/21 1500  BP:  134/77 (!) 147/86 (!) 145/97  Pulse:  88 88 78  Resp:  17 16 19   Temp:  97.9 F (36.6 C) 97.6 F (36.4 C) 98.4 F (36.9 C)  TempSrc:  Oral Oral Oral  SpO2:  92% 94% 96%  Weight: 58 kg     Height:        Intake/Output Summary (Last 24 hours) at 07/05/2021 1538 Last data filed at 07/04/2021 2137 Gross per 24 hour  Intake 324.88 ml  Output 809 ml  Net -484.12 ml   Filed Weights   06/28/21 1135 06/29/21 0554 07/05/21 0500  Weight: 61.7 kg 63.5 kg 58 kg  Exam:  General: 61 y.o. year-old male frail-appearing no acute stress..  Alert and oriented x3.  NG tube in place. Cardiovascular: Regular rate and rhythm with no rubs or gallops.  No thyromegaly or JVD noted.   Respiratory: Faint rales at bases.  No wheezing noted.  Poor inspiratory effort. Abdomen: Soft nontender nondistended with bowel sounds present.   Musculoskeletal: No  lower extremity edema bilaterally. Skin: No ulcerative lesions noted or rashes, Psychiatry: Mood is appropriate for condition and setting   Data Reviewed: CBC: Recent Labs  Lab 07/02/21 0515 07/03/21 0343 07/04/21 0504 07/05/21 0336  WBC 16.3* 11.3* 12.9* 10.4  NEUTROABS 11.2*  --   --   --   HGB 11.0* 9.9* 9.1* 9.2*  HCT 32.6* 30.0* 26.5* 27.0*  MCV 101.6* 101.0* 98.9 99.3  PLT 189 231 259 286   Basic Metabolic Panel: Recent Labs  Lab 07/02/21 0515 07/03/21 0343 07/04/21 0504 07/05/21 0336  NA 133* 135 134* 137  K 4.5 4.1 4.0 4.0  CL 100 102 102 107  CO2 24 21* 22 23  GLUCOSE 97 85 122* 166*  BUN 15 16 20  26*  CREATININE 0.88 0.77 0.75 0.67  CALCIUM 9.6 9.4 9.3 9.3   GFR: Estimated Creatinine Clearance: 80.6 mL/min (by C-G formula based on SCr of 0.67 mg/dL). Liver Function Tests: No results for input(s): AST, ALT, ALKPHOS, BILITOT, PROT, ALBUMIN in the last 168 hours. No results for input(s): LIPASE, AMYLASE in the last 168 hours. No results for input(s): AMMONIA in the last 168 hours. Coagulation Profile: No results for input(s): INR, PROTIME in the last 168 hours. Cardiac Enzymes: No results for input(s): CKTOTAL, CKMB, CKMBINDEX, TROPONINI in the last 168 hours. BNP (last 3 results) No results for input(s): PROBNP in the last 8760 hours. HbA1C: No results for input(s): HGBA1C in the last 72 hours. CBG: Recent Labs  Lab 07/04/21 1937 07/05/21 0348 07/05/21 0740 07/05/21 1136 07/05/21 1534  GLUCAP 153* 179* 143* 161* 171*   Lipid Profile: No results for input(s): CHOL, HDL, LDLCALC, TRIG, CHOLHDL, LDLDIRECT in the last 72 hours. Thyroid Function Tests: No results for input(s): TSH, T4TOTAL, FREET4, T3FREE, THYROIDAB in the last 72 hours. Anemia Panel: No results for input(s): VITAMINB12, FOLATE, FERRITIN, TIBC, IRON, RETICCTPCT in the last 72 hours. Urine analysis:    Component Value Date/Time   COLORURINE YELLOW 07/03/2021 1110   APPEARANCEUR  CLEAR 07/03/2021 1110   LABSPEC 1.010 07/03/2021 1110   PHURINE 6.0 07/03/2021 1110   GLUCOSEU NEGATIVE 07/03/2021 1110   HGBUR TRACE (A) 07/03/2021 1110   BILIRUBINUR NEGATIVE 07/03/2021 1110   KETONESUR >80 (A) 07/03/2021 1110   PROTEINUR NEGATIVE 07/03/2021 1110   NITRITE NEGATIVE 07/03/2021 1110   LEUKOCYTESUR NEGATIVE 07/03/2021 1110   Sepsis Labs: @LABRCNTIP (procalcitonin:4,lacticidven:4)  ) Recent Results (from the past 240 hour(s))  SARS Coronavirus 2 by RT PCR (hospital order, performed in [facility] hospital lab) Nasopharyngeal Nasopharyngeal Swab     Status: None   Collection Time: 06/29/21  6:00 AM   Specimen: Nasopharyngeal Swab  Result Value Ref Range Status   SARS Coronavirus 2 NEGATIVE NEGATIVE Final    Comment: (NOTE) SARS-CoV-2 target nucleic acids are NOT DETECTED.  The SARS-CoV-2 RNA is generally detectable in upper and lower respiratory specimens during the acute phase of infection. The lowest concentration of SARS-CoV-2 viral copies this assay can detect is 250 copies / mL. A negative result does not preclude SARS-CoV-2 infection and should not be used as the sole basis for treatment  or other patient management decisions.  A negative result may occur with improper specimen collection / handling, submission of specimen other than nasopharyngeal swab, presence of viral mutation(s) within the areas targeted by this assay, and inadequate number of viral copies (<250 copies / mL). A negative result must be combined with clinical observations, patient history, and epidemiological information.  Fact Sheet for Patients:   BoilerBrush.com.cy  Fact Sheet for Healthcare Providers: https://pope.com/  This test is not yet approved or  cleared by the [place] FDA and has been authorized for detection and/or diagnosis of SARS-CoV-2 by FDA under an Emergency Use Authorization (EUA).  This EUA will remain in effect  (meaning this test can be used) for the duration of the COVID-19 declaration under Section 564(b)(1) of the Act, 21 U.S.C. section 360bbb-3(b)(1), unless the authorization is terminated or revoked sooner.  Performed at [facility], [address], [place], [place] [address]   Surgical PCR Screen     Status: None   Collection Time: 06/29/21  6:16 AM   Specimen: Nasal Mucosa; Nasal Swab  Result Value Ref Range Status   MRSA, PCR NEGATIVE NEGATIVE Final   Staphylococcus aureus NEGATIVE NEGATIVE Final    Comment: (NOTE) The Xpert SA Assay (FDA approved for NASAL specimens in patients 43 years of age and older), is one component of a comprehensive surveillance program. It is not intended to diagnose infection nor to guide or monitor treatment. Performed at [facility], [address], [place], [place] [address]   Urine Culture     Status: None   Collection Time: 07/03/21 11:10 AM   Specimen: Urine, Catheterized  Result Value Ref Range Status   Specimen Description URINE, CATHETERIZED  Final   Special Requests NONE  Final   Culture   Final    NO GROWTH Performed at [facility], [address] [place], [place] [address]    Report Status 07/04/2021 FINAL  Final  MRSA Next Gen by PCR, Nasal     Status: None   Collection Time: 07/03/21 12:29 PM   Specimen: Nasal Mucosa; Nasal Swab  Result Value Ref Range Status   MRSA by PCR Next Gen NOT DETECTED NOT DETECTED Final    Comment: (NOTE) The GeneXpert MRSA Assay (FDA approved for NASAL specimens only), is one component of a comprehensive MRSA colonization surveillance program. It is not intended to diagnose MRSA infection nor to guide or monitor treatment for MRSA infections. Test performance is not FDA approved in patients less than 63 years old. Performed at [facility], [address], [place], [place] [address]   Culture, blood (routine x 2)     Status: None (Preliminary result)   Collection Time:  07/03/21 12:53 PM   Specimen: BLOOD RIGHT HAND  Result Value Ref Range Status   Specimen Description BLOOD RIGHT HAND  Final   Special Requests   Final    BOTTLES DRAWN AEROBIC AND ANAEROBIC Blood Culture adequate volume   Culture   Final    NO GROWTH 2 DAYS Performed at [facility], [address], [place], [place] [address]    Report Status PENDING  Incomplete  Culture, blood (routine x 2)     Status: None (Preliminary result)   Collection Time: 07/03/21  1:08 PM   Specimen: BLOOD LEFT HAND  Result Value Ref Range Status   Specimen Description BLOOD LEFT HAND  Final   Special Requests   Final  BOTTLES DRAWN AEROBIC AND ANAEROBIC Blood Culture adequate volume   Culture   Final    NO GROWTH 2 DAYS Performed at [facility], [address] [place], [place] [address]    Report Status PENDING  Incomplete      Studies: No results found.  Scheduled Meds:  chlorhexidine  15 mL Mouth Rinse BID   Chlorhexidine Gluconate Cloth  6 each Topical Daily   dexamethasone (DECADRON) injection  4 mg Intravenous Q6H   feeding supplement (PROSource TF)  45 mL Per Tube Daily   mouth rinse  15 mL Mouth Rinse q12n4p   senna-docusate  1 tablet Per Tube BID   sodium chloride flush  3 mL Intravenous Q12H    Continuous Infusions:  sodium chloride 250 mL (06/29/21 1316)   sodium chloride 50 mL/hr at 07/04/21 2000   ampicillin-sulbactam (UNASYN) IV 3 g (07/05/21 0628)   feeding supplement (OSMOLITE 1.5 CAL) 1,000 mL (07/05/21 0622)     LOS: 6 days     Darlin Drop, MD [facility] Pager [phone]  If 7PM-7AM, please contact night-coverage [email or web address] Password [facility] 07/05/2021, 3:38 PM

## 2021-07-05 NOTE — Progress Notes (Signed)
Speech Language Pathology Treatment:    Patient Details Name: Brian [name] MRN: [id number] DOB: [birth year] Today's Date: 07/05/2021 Time:  -     ST reordered due to RN having a question about diet. Spoke with RN.                            Houston Siren  07/05/2021, 11:58 AM

## 2021-07-05 NOTE — Progress Notes (Signed)
Occupational Therapy Treatment Patient Details Name: Brian [name] MRN: [id number] DOB: 26-Sep-1960 Today's Date: 07/05/2021   History of present illness Pt is a 61 y/o male admitted 06/29/21 following C3-7 ACDF after progressive cervical myelopathy symptoms with subsequent fall and severe incomplete SCI. No significant PMH. 2/8: Status post reexploration of anterior cervical fusion and removal of epidural hematoma.   OT comments  Patient with fair progress toward all patient focused goals.  Bulk of the session was B upper body HEP for strengthening.  See below for HEP performed.  Patient's trunk was supported in sitting, and patient using body weight to perform exercises with support from OT to guide patient through movements.  Trunk strength/control challenged in sitting with leaning forward/back and side to side.  Deficits impacting independence are listed below.  OT to continue efforts in the acute setting with SNF recommended for post acute rehab.  Hopefully as strength continues to improve, OT can begin to focus on more ADL tasks.     Recommendations for follow up therapy are one component of a multi-disciplinary discharge planning process, led by the attending physician.  Recommendations may be updated based on patient status, additional functional criteria and insurance authorization.    Follow Up Recommendations  Skilled nursing-short term rehab (<3 hours/day)    Assistance Recommended at Discharge Frequent or constant Supervision/Assistance  Patient can return home with the following  Two people to help with walking and/or transfers;A lot of help with bathing/dressing/bathroom;Direct supervision/assist for medications management;Assist for transportation;Help with stairs or ramp for entrance;Assistance with feeding   Equipment Recommendations  Wheelchair (measurements OT);Wheelchair cushion (measurements OT)    Recommendations for Other Services      Precautions / Restrictions  Precautions Precautions: Fall;Cervical Precaution Booklet Issued: Yes (comment) Precaution Comments: Verbally reviewed precautions during functional mobility. Required Braces or Orthoses: Cervical Brace Cervical Brace: Soft collar;At all times Restrictions Weight Bearing Restrictions: No       Mobility Bed Mobility                    Transfers Up in recliner - repositioned back                         Balance Overall balance assessment: Needs assistance Sitting-balance support: Feet supported, Bilateral upper extremity supported Sitting balance-Leahy Scale: Poor Sitting balance - Comments: needs B UE and external support to maintain upright posture in recliner Postural control: Posterior lean, Right lateral lean, Left lateral lean                                   Extremity/Trunk Assessment Upper Extremity Assessment RUE Deficits / Details: shoulder 2-/5 strength; elbow 2-/5 strength, wrist and hand 2 to 2-/5; digits 2-/5 RUE Sensation: decreased light touch RUE Coordination: decreased fine motor;decreased gross motor LUE Deficits / Details: shoulder 2-/5 strength; elbow 2-/5 strength, wrist and hand 2 to 2-/5; digits 2-/5 LUE Sensation: decreased light touch LUE Coordination: decreased fine motor;decreased gross motor                              Cognition Arousal/Alertness: Awake/alert Behavior During Therapy: WFL for tasks assessed/performed Overall Cognitive Status: Within Functional Limits for tasks assessed  Exercises General Exercises - Upper Extremity Shoulder Flexion: AAROM, Seated, Both, 10 reps (to 90 degrees) Shoulder Extension: AAROM, Both, 10 reps, Seated Shoulder ABduction: AAROM, Both, 10 reps, Seated Elbow Flexion: AAROM, Both, 10 reps, Seated Elbow Extension: AAROM, 10 reps, Seated Wrist Flexion: AAROM, Both, 10 reps, Seated Wrist Extension: AAROM,  Both, Seated Digit Composite Flexion: AROM, Both, 10 reps Chair Push Up: AAROM, Both, 5 reps, Seated    Shoulder Instructions       General Comments      Pertinent Vitals/ Pain       Pain Assessment Pain Assessment: Faces Faces Pain Scale: Hurts little more Pain Location: Neck Pain Descriptors / Indicators: Shooting, Tender Pain Intervention(s): Monitored during session                                                          Frequency  Min 2X/week        Progress Toward Goals  OT Goals(current goals can now be found in the care plan section)  Progress towards OT goals: Progressing toward goals  Acute Rehab OT Goals Patient Stated Goal: continue to get stronger OT Goal Formulation: With patient Time For Goal Achievement: 07/18/21 Potential to Achieve Goals: [place] Discharge plan remains appropriate    Co-evaluation                 AM-PAC OT "6 Clicks" Daily Activity     Outcome Measure   Help from another person eating meals?: A Little Help from another person taking care of personal grooming?: A Lot Help from another person toileting, which includes using toliet, bedpan, or urinal?: Total Help from another person bathing (including washing, rinsing, drying)?: A Lot Help from another person to put on and taking off regular upper body clothing?: A Lot Help from another person to put on and taking off regular lower body clothing?: Total 6 Click Score: 11    End of Session Equipment Utilized During Treatment: Cervical collar  OT Visit Diagnosis: Unsteadiness on feet (R26.81);Muscle weakness (generalized) (M62.81);Ataxia, unspecified (R27.0);Feeding difficulties (R63.3)   Activity Tolerance Patient tolerated treatment well   Patient Left in chair;with call bell/phone within reach   Nurse Communication          Time: [id number] OT Time Calculation (min): 22 min  Charges: OT General Charges $OT Visit: 1 Visit OT  Treatments $Therapeutic Exercise: 8-22 mins  07/05/2021  RP, OTR/L  Acute Rehabilitation Services  Office:  [phone] 07/05/2021, 3:00 PM

## 2021-07-05 NOTE — Progress Notes (Signed)
Physical Therapy Treatment Patient Details Name: Brian [name] MRN: [id number] DOB: 11-28-1960 Today's Date: 07/05/2021   History of Present Illness Pt is a 61 y/o male admitted 06/29/21 following C3-7 ACDF after progressive cervical myelopathy symptoms with subsequent fall and severe incomplete SCI. No significant PMH. 2/8: Status post reexploration of anterior cervical fusion and removal of epidural hematoma.    PT Comments    Pt had increase pain irritability today. He was able to mobilize in bed and sit on EOB with Max assistance. He performed sit to stand with max assist +2. He stood and took steps to the chair with the EVA walker and max assist +2. During treatment session patient showed deficits in strength, endurance, activity tolerance. Recommending therapy services at Acute Inpatient Rehab to address the previously stated deficits. Will continue to follow acutely to maximize functional mobility, independence and safety.   Recommendations for follow up therapy are one component of a multi-disciplinary discharge planning process, led by the attending physician.  Recommendations may be updated based on patient status, additional functional criteria and insurance authorization.  Follow Up Recommendations  Acute inpatient rehab (3hours/day)     Assistance Recommended at Discharge Frequent or constant Supervision/Assistance  Patient can return home with the following A lot of help with walking and/or transfers   Equipment Recommendations  Rolling walker (2 wheels)    Recommendations for Other Services Rehab consult     Precautions / Restrictions Precautions Precautions: Fall;Cervical Precaution Booklet Issued: Yes (comment) Precaution Comments: Verbally reviewed precautions during functional mobility. Required Braces or Orthoses: Cervical Brace Cervical Brace: Soft collar;At all times Restrictions Weight Bearing Restrictions: No     Mobility  Bed Mobility Overal bed  mobility: Needs Assistance Bed Mobility: Rolling, Sidelying to Sit Rolling: Mod assist Sidelying to sit: Max assist     Sit to sidelying: Mod assist, +2 for physical assistance      Transfers Overall transfer level: Needs assistance Equipment used:  (EVA walker) Transfers: Sit to/from Stand, Bed to chair/wheelchair/BSC Sit to Stand: +2 physical assistance, From elevated surface, Max assist   Step pivot transfers: Max assist, +2 physical assistance       General transfer comment: Patient required max assist and extra cuing in order to balance and take steps to the chair. Transfer via Lift Equipment:  (Multimedia programmer)  Ambulation/Gait Ambulation/Gait assistance: +2 physical assistance Gait Distance (Feet): 4 Feet Assistive device: Ethelene Hal, Bilateral platform walker Gait Pattern/deviations: Decreased stride length, Step-to pattern, Ataxic, Knees buckling, Decreased dorsiflexion - left, Decreased dorsiflexion - right, Shuffle, Trendelenburg Gait velocity: Decreased     General Gait Details: Patient required max assist and extra cuing in order to take steps. He reported liking the EVA walker more than the rolling walker.   Stairs             Wheelchair Mobility    Modified Rankin (Stroke Patients Only)       Balance Overall balance assessment: Needs assistance Sitting-balance support: Feet supported, Bilateral upper extremity supported Sitting balance-Leahy Scale: Poor Sitting balance - Comments: Patient on EOB requires mod to max assist to stay upright Postural control: Posterior lean, Right lateral lean, Left lateral lean Standing balance support: Bilateral upper extremity supported Standing balance-Leahy Scale: Zero Standing balance comment: Mod assist with eva walker to maintain standing balance.                            Cognition Arousal/Alertness: Awake/alert Behavior During  Therapy: WFL for tasks assessed/performed Overall Cognitive Status:  Within Functional Limits for tasks assessed                                          Exercises Other Exercises Other Exercises: Heelslides, Leg raises, x10 bil Other Exercises: manually resisted UE push and pull. x5 each side    General Comments        Pertinent Vitals/Pain Pain Assessment Pain Assessment: Faces Faces Pain Scale: Hurts little more Pain Descriptors / Indicators: Tightness, Sore, Shooting, Numbness, Tingling Pain Intervention(s): Monitored during session    Home Living                          Prior Function            PT Goals (current goals can now be found in the care plan section)      Frequency    Min 5X/week      PT Plan Discharge plan needs to be updated;Equipment recommendations need to be updated    Co-evaluation              AM-PAC PT "6 Clicks" Mobility   Outcome Measure  Help needed turning from your back to your side while in a flat bed without using bedrails?: A Lot Help needed moving from lying on your back to sitting on the side of a flat bed without using bedrails?: A Lot Help needed moving to and from a bed to a chair (including a wheelchair)?: Total Help needed standing up from a chair using your arms (e.g., wheelchair or bedside chair)?: Total Help needed to walk in hospital room?: Total Help needed climbing 3-5 steps with a railing? : Total 6 Click Score: 8    End of Session Equipment Utilized During Treatment: Gait belt;Cervical collar Activity Tolerance: Patient tolerated treatment well Patient left: in chair;with call bell/phone within reach;with chair alarm set Nurse Communication: Mobility status PT Visit Diagnosis: Unsteadiness on feet (R26.81);History of falling (Z91.81);Ataxic gait (R26.0);Muscle weakness (generalized) (M62.81);Other symptoms and signs involving the nervous system (R29.898);Difficulty in walking, not elsewhere classified (R26.2)     Time: [id number] PT Time  Calculation (min) (ACUTE ONLY): 39 min  Charges:  $Therapeutic Exercise: 8-22 mins $Therapeutic Activity: 23-37 mins                     Quenton Fetter, SPT    Quenton Fetter 07/05/2021, 4:43 PM

## 2021-07-05 NOTE — Progress Notes (Signed)
No new issues or problems overnight.  Patient denies significant pain.  He continues to regain strength in both upper and lower extremities.  He is afebrile.  Vital signs are normal.  Urine output is good.  Drain output is minimal.  He is awake and alert.  He is oriented and appropriate.  He appears in no distress.  Motor examination with 4+/5 strength in his upper extremities bilaterally still with some relative right-sided weakness compared to the left.  His wound is healing well.  His neck is soft.  Chest and abdomen benign.  Overall progressing reasonably well.  Plan to remove drain today.  Plan to give a voiding trial today.  Continue efforts at therapies.

## 2021-07-05 NOTE — Progress Notes (Signed)
Pt. Had a blood sugar of 179 - per protocol, contacted neuro on call @ [phone] Dr. Franky Macho returned call and stated no action to be taken at this time

## 2021-07-06 ENCOUNTER — Inpatient Hospital Stay (HOSPITAL_COMMUNITY): Payer: 59

## 2021-07-06 DIAGNOSIS — G959 Disease of spinal cord, unspecified: Secondary | ICD-10-CM | POA: Diagnosis not present

## 2021-07-06 LAB — BASIC METABOLIC PANEL
Anion gap: 10 (ref 5–15)
BUN: 21 mg/dL — ABNORMAL HIGH (ref 6–20)
CO2: 24 mmol/L (ref 22–32)
Calcium: 9.5 mg/dL (ref 8.9–10.3)
Chloride: 100 mmol/L (ref 98–111)
Creatinine, Ser: 0.67 mg/dL (ref 0.61–1.24)
GFR, Estimated: >60 mL/min (ref >60)
Glucose, Bld: 121 mg/dL — ABNORMAL HIGH (ref 70–99)
Potassium: 3.8 mmol/L (ref 3.5–5.1)
Sodium: 134 mmol/L — ABNORMAL LOW (ref 135–145)

## 2021-07-06 LAB — GLUCOSE, CAPILLARY
Glucose-Capillary: 127 mg/dL — ABNORMAL HIGH (ref 70–99)
Glucose-Capillary: 202 mg/dL — ABNORMAL HIGH (ref 70–99)
Glucose-Capillary: 139 mg/dL — ABNORMAL HIGH (ref 70–99)
Glucose-Capillary: 104 mg/dL — ABNORMAL HIGH (ref 70–99)
Glucose-Capillary: 108 mg/dL — ABNORMAL HIGH (ref 70–99)
Glucose-Capillary: 103 mg/dL — ABNORMAL HIGH (ref 70–99)

## 2021-07-06 LAB — CBC
HCT: 28.7 % — ABNORMAL LOW (ref 39.0–52.0)
Hemoglobin: 9.8 g/dL — ABNORMAL LOW (ref 13.0–17.0)
MCH: 33 pg (ref 26.0–34.0)
MCHC: 34.1 g/dL (ref 30.0–36.0)
MCV: 96.6 fL (ref 80.0–100.0)
Platelets: 340 10*3/uL (ref 150–400)
RBC: 2.97 MIL/uL — ABNORMAL LOW (ref 4.22–5.81)
RDW: 13.2 % (ref 11.5–15.5)
WBC: 15.3 10*3/uL — ABNORMAL HIGH (ref 4.0–10.5)
nRBC: 0 % (ref 0.0–0.2)

## 2021-07-06 NOTE — Progress Notes (Signed)
Speech Language Pathology Treatment:    Patient Details Name: Brian [name] MRN: [id number] DOB: [birth year] Today's Date: 07/06/2021 Time:  -     MBS scheduled for 1330 today                        Houston Siren  07/06/2021, 10:34 AM Orbie Pyo Colvin Caroli.Ed Tourist information centre manager [phone]

## 2021-07-06 NOTE — Progress Notes (Addendum)
Patent has insurance but Inpatient Rehab or SNF is not covered. CSW sent message to PT/Suetta Hoffmeister and inquired if the patient can reviewed for [facility] program. She advised the patient is seen by PT 5x per week anyway and will forward the request.    TOC will continue to follow and assist with discharge planning. TOC appreciates therapy efforts to work with the patient while in the hospital.  Antony Blackbird, MSW, LCSW Clinical Social Worker

## 2021-07-06 NOTE — Progress Notes (Signed)
Physical Therapy Treatment Patient Details Name: Brian [name] MRN: [id number] DOB: [birth year] Today's Date: 07/06/2021   History of Present Illness Pt is a 61 y/o male admitted 06/29/21 following C3-7 ACDF after progressive cervical myelopathy symptoms with subsequent fall and severe incomplete SCI. No significant PMH. 2/8: Status post reexploration of anterior cervical fusion and removal of epidural hematoma.    PT Comments    Patient progressing with sitting balance today able to sit with close S.  Also able to stand with less assist to standard RW, though still with difficulty keeping hands on walker without assist.  Remains ataxic with poor trunk control needing hands on support for balance/safety.  Patient remains appropriate for AIR, though would have to be self pay and already declined.  Not sure safest d/c plan, but he feels home with his neighbor who is RN helping him intermittently.  Don't see him safe home alone majority of the day.  PT will continue to follow acutely.    Recommendations for follow up therapy are one component of a multi-disciplinary discharge planning process, led by the attending physician.  Recommendations may be updated based on patient status, additional functional criteria and insurance authorization.  Follow Up Recommendations  Acute inpatient rehab (3hours/day)     Assistance Recommended at Discharge Frequent or constant Supervision/Assistance  Patient can return home with the following A lot of help with walking and/or transfers   Equipment Recommendations  Rolling walker (2 wheels)    Recommendations for Other Services       Precautions / Restrictions Precautions Precautions: Fall;Cervical Required Braces or Orthoses: Cervical Brace Cervical Brace: Soft collar;At all times     Mobility  Bed Mobility Overal bed mobility: Needs Assistance Bed Mobility: Rolling, Sidelying to Sit Rolling: Mod assist Sidelying to sit: Mod assist        General bed mobility comments: assist to flex legs and turn hips then for assisting to get legs off bed and trunk upright    Transfers Overall transfer level: Needs assistance Equipment used: Rolling walker (2 wheels) Transfers: Sit to/from Stand Sit to Stand: From elevated surface, +2 physical assistance, Mod assist Stand pivot transfers: Mod assist, +2 physical assistance         General transfer comment: able to take steps with A for core stability at trunk and for walker safety after placing hands on walker    Ambulation/Gait                   Stairs             Wheelchair Mobility    Modified Rankin (Stroke Patients Only)       Balance Overall balance assessment: Needs assistance Sitting-balance support: Feet supported Sitting balance-Leahy Scale: Fair Sitting balance - Comments: able to balance on EOB today with close S noted moving within BOS without LOB Postural control: Posterior lean Standing balance support: Bilateral upper extremity supported Standing balance-Leahy Scale: Poor Standing balance comment: mod A with walker for balance                            Cognition Arousal/Alertness: Awake/alert Behavior During Therapy: Agitated Overall Cognitive Status: Impaired/Different from baseline Area of Impairment: Safety/judgement, Memory                     Memory: Decreased short-term memory, Decreased recall of precautions   Safety/Judgement: Decreased awareness of deficits     General  Comments: wanted core track removed and stated he has not eaten since last Thursday; explained the core track is how he is getting nutrition right now since he can't eat with aspiration risks        Exercises General Exercises - Lower Extremity Heel Slides: Strengthening, Both, 10 reps, Seated (with manual resistance to extension, pt assisting with hip flexion) Heel Raises: Strengthening, Both, 10 reps, Seated    General Comments  General comments (skin integrity, edema, etc.): VSS, pt on air redistribution cushion in chair, RN aware no chair alarm      Pertinent Vitals/Pain Pain Assessment Faces Pain Scale: Hurts little more Pain Location: Neck Pain Descriptors / Indicators: Shooting, Tender Pain Intervention(s): Monitored during session, Repositioned    Home Living                          Prior Function            PT Goals (current goals can now be found in the care plan section) Progress towards PT goals: Progressing toward goals    Frequency    Min 5X/week      PT Plan Current plan remains appropriate    Co-evaluation              AM-PAC PT "6 Clicks" Mobility   Outcome Measure  Help needed turning from your back to your side while in a flat bed without using bedrails?: A Lot Help needed moving from lying on your back to sitting on the side of a flat bed without using bedrails?: A Lot Help needed moving to and from a bed to a chair (including a wheelchair)?: A Lot Help needed standing up from a chair using your arms (e.g., wheelchair or bedside chair)?: A Lot Help needed to walk in hospital room?: Total Help needed climbing 3-5 steps with a railing? : Total 6 Click Score: 10    End of Session Equipment Utilized During Treatment: Gait belt;Cervical collar Activity Tolerance: Patient tolerated treatment well Patient left: in chair;with call bell/phone within reach;with chair alarm set   PT Visit Diagnosis: Unsteadiness on feet (R26.81);History of falling (Z91.81);Ataxic gait (R26.0);Muscle weakness (generalized) (M62.81);Other symptoms and signs involving the nervous system (R29.898);Difficulty in walking, not elsewhere classified (R26.2)     Time: [id number] PT Time Calculation (min) (ACUTE ONLY): 24 min  Charges:  $Therapeutic Exercise: 8-22 mins $Therapeutic Activity: 8-22 mins                     Magda Kiel, PT Acute Rehabilitation  Services [id number] Office:[phone] 07/06/2021    Brian [name] 07/06/2021, 2:05 PM

## 2021-07-06 NOTE — Progress Notes (Signed)
RT NOTES: Pt refused CPT at this time. Asked RT to come back later.

## 2021-07-06 NOTE — Progress Notes (Signed)
PROGRESS NOTE  Brian [name] [id number] DOB: 1960/12/17 DOA: 06/29/2021 PCP: Pcp, No  HPI/Recap of past 32 hours:  61 year old male with prior history of tobacco abuse and ETOH use who recently was found to have critical multilevel cervical spinal stenosis C3-6 with spinal cord compression and spinal cord signal change after recent fall with progressive upper and lower extremity weakness and tremors.  He was admitted on 2/3 and underwent ACDF of C3-4, C4-5, C5-5, and C6-7.  Post-operatively he was progressing slowly with residual weakness in both hands but improving lower extremity strength and function.  He developed some difficulty swallowing on 2/5 and SLP ordered and placed on thickened liquids.   Overnight 2/6, patient with increased difficulty swallowing and was made NPO but also noted to have dysarthria and difficulty moving extremities with numbness.  SLP evaluated and found to be moderate aspiration risk.   Also progressively tachycardic, tachypneic, and now febrile.  Code stroke activated and taken for CT/ CTA head and neck and was given decadron 10mg  once.  NIHSS 18.  CTH was negative for acute findings, and CTA head neck did not reveal any LVO but noted significant prevertebral soft tissue swelling with foci of gas present at the ventral epidural space at C4-5, small collection not excluded.  On 2/7, patient with worsening confusion and concern for airway involvement, PCCM consulted for further evaluation.   07/06/2021: Seen and examined at bedside.  Abdomen is tender diffusely.  Assessment/Plan: Principal Problem:   Cervical myelopathy (HCC) Active Problems:   Malnutrition of moderate degree  Sepsis secondary to aspiration pneumonia  De-escalate ABX to unasyn to complete 5 day course Flutter valve 10 puffs BID  Supplemental oxygen as needed for sat goal > 92 Speech following Aspiration precautions   Severe cervical spinal stenosis with spinal cord compression s/p ACDF C3-C7  on 2/3 c/b epidural hematoma s/p evacuation 2/7 Exam drastically improved s/p evacuation.  NSGY evaluation Speech evaluation, may need cortrak Aspiration precautions    Daily tobacco use  Occasional ETOH use Monitor for signs of withdrawal  Tobacco cessation education when appropriate   Physical debility Continue fall precautions PT OT assessment recommending SNF CSW assisting with SNF placement.  Highly appreciated.  Severe protein calorie malnutrition BMI 18 Severe muscle mass loss Continue oral supplements  Hyperglycemia likely secondary to ongoing steroid use Obtain hemoglobin A1c Start sensitive insulin sliding scale Avoid hypoglycemia   Code Status: Full code  Family Communication: None at bedside  Disposition Plan: Likely will discharge to SNF.  Thank you for allowing [place] to participate in the care of this patient.  We will continue to follow along with you.       Objective: Vitals:   07/06/21 0337 07/06/21 0452 07/06/21 0806 07/06/21 1545  BP: (!) 145/87  (!) 145/95 (!) 142/97  Pulse: 78  82 87  Resp: 19  16 18   Temp: 98.6 F (37 C)  97.6 F (36.4 C) 98.2 F (36.8 C)  TempSrc: Oral   Oral  SpO2: 96%  95% 98%  Weight:  59.3 kg    Height:        Intake/Output Summary (Last 24 hours) at 07/06/2021 1559 Last data filed at 07/06/2021 1338 Gross per 24 hour  Intake --  Output 2550 ml  Net -2550 ml   Filed Weights   06/29/21 0554 07/05/21 0500 07/06/21 0452  Weight: 63.5 kg 58 kg 59.3 kg    Exam:  General: 61 y.o. year-old male frail-appearing no  acute distress.  He is alert and oriented x3.  NG tube in place.   Cardiovascular: Regular rate and rhythm no rubs or gallops.   Respiratory: Clear to auscultation no wheezes or rales.  Poor inspiratory effort. Abdomen: Soft diffusely tender.  Bowel sounds present.   Musculoskeletal: No lower extremity edema bilaterally.   Skin: No ulcerative lesions noted. Psychiatry: Mood is appropriate for  condition .   Data Reviewed: CBC: Recent Labs  Lab 07/02/21 0515 07/03/21 0343 07/04/21 0504 07/05/21 0336 07/06/21 0346  WBC 16.3* 11.3* 12.9* 10.4 15.3*  NEUTROABS 11.2*  --   --   --   --   HGB 11.0* 9.9* 9.1* 9.2* 9.8*  HCT 32.6* 30.0* 26.5* 27.0* 28.7*  MCV 101.6* 101.0* 98.9 99.3 96.6  PLT 189 231 259 286 [id number]   Basic Metabolic Panel: Recent Labs  Lab 07/02/21 0515 07/03/21 0343 07/04/21 0504 07/05/21 0336 07/06/21 0346  NA 133* 135 134* 137 134*  K 4.5 4.1 4.0 4.0 3.8  CL 100 102 102 107 100  CO2 24 21* 22 23 24   GLUCOSE 97 85 122* 166* 121*  BUN 15 16 20  26* 21*  CREATININE 0.88 0.77 0.75 0.67 0.67  CALCIUM 9.6 9.4 9.3 9.3 9.5   GFR: Estimated Creatinine Clearance: 82.4 mL/min (by C-G formula based on SCr of 0.67 mg/dL). Liver Function Tests: No results for input(s): AST, ALT, ALKPHOS, BILITOT, PROT, ALBUMIN in the last 168 hours. No results for input(s): LIPASE, AMYLASE in the last 168 hours. No results for input(s): AMMONIA in the last 168 hours. Coagulation Profile: No results for input(s): INR, PROTIME in the last 168 hours. Cardiac Enzymes: No results for input(s): CKTOTAL, CKMB, CKMBINDEX, TROPONINI in the last 168 hours. BNP (last 3 results) No results for input(s): PROBNP in the last 8760 hours. HbA1C: Recent Labs    07/05/21 0339  HGBA1C 5.2   CBG: Recent Labs  Lab 07/05/21 1951 07/05/21 2315 07/06/21 0352 07/06/21 0802 07/06/21 1137  GLUCAP 158* 149* 127* 202* 139*   Lipid Profile: No results for input(s): CHOL, HDL, LDLCALC, TRIG, CHOLHDL, LDLDIRECT in the last 72 hours. Thyroid Function Tests: No results for input(s): TSH, T4TOTAL, FREET4, T3FREE, THYROIDAB in the last 72 hours. Anemia Panel: No results for input(s): VITAMINB12, FOLATE, FERRITIN, TIBC, IRON, RETICCTPCT in the last 72 hours. Urine analysis:    Component Value Date/Time   COLORURINE YELLOW 07/03/2021 1110   APPEARANCEUR CLEAR 07/03/2021 1110   LABSPEC 1.010  07/03/2021 1110   PHURINE 6.0 07/03/2021 1110   GLUCOSEU NEGATIVE 07/03/2021 1110   HGBUR TRACE (A) 07/03/2021 1110   BILIRUBINUR NEGATIVE 07/03/2021 1110   KETONESUR >80 (A) 07/03/2021 1110   PROTEINUR NEGATIVE 07/03/2021 1110   NITRITE NEGATIVE 07/03/2021 1110   LEUKOCYTESUR NEGATIVE 07/03/2021 1110   Sepsis Labs: @LABRCNTIP (procalcitonin:4,lacticidven:4)  ) Recent Results (from the past 240 hour(s))  SARS Coronavirus 2 by RT PCR (hospital order, performed in [place] hospital lab) Nasopharyngeal Nasopharyngeal Swab     Status: None   Collection Time: 06/29/21  6:00 AM   Specimen: Nasopharyngeal Swab  Result Value Ref Range Status   SARS Coronavirus 2 NEGATIVE NEGATIVE Final    Comment: (NOTE) SARS-CoV-2 target nucleic acids are NOT DETECTED.  The SARS-CoV-2 RNA is generally detectable in upper and lower respiratory specimens during the acute phase of infection. The lowest concentration of SARS-CoV-2 viral copies this assay can detect is 250 copies / mL. A negative result does not preclude SARS-CoV-2 infection and should not  be used as the sole basis for treatment or other patient management decisions.  A negative result may occur with improper specimen collection / handling, submission of specimen other than nasopharyngeal swab, presence of viral mutation(s) within the areas targeted by this assay, and inadequate number of viral copies (<250 copies / mL). A negative result must be combined with clinical observations, patient history, and epidemiological information.  Fact Sheet for Patients:   StrictlyIdeas.no  Fact Sheet for Healthcare Providers: BankingDealers.co.za  This test is not yet approved or  cleared by the [place] FDA and has been authorized for detection and/or diagnosis of SARS-CoV-2 by FDA under an Emergency Use Authorization (EUA).  This EUA will remain in effect (meaning this test can be used) for  the duration of the COVID-19 declaration under Section 564(b)(1) of the Act, 21 U.S.C. section 360bbb-3(b)(1), unless the authorization is terminated or revoked sooner.  Performed at [facility], [place] [address], [place], [place] [address]   Surgical PCR Screen     Status: None   Collection Time: 06/29/21  6:16 AM   Specimen: Nasal Mucosa; Nasal Swab  Result Value Ref Range Status   MRSA, PCR NEGATIVE NEGATIVE Final   Staphylococcus aureus NEGATIVE NEGATIVE Final    Comment: (NOTE) The Xpert SA Assay (FDA approved for NASAL specimens in patients 37 years of age and older), is one component of a comprehensive surveillance program. It is not intended to diagnose infection nor to guide or monitor treatment. Performed at [facility], [place] [address] [place], [place] [address]   Urine Culture     Status: None   Collection Time: 07/03/21 11:10 AM   Specimen: Urine, Catheterized  Result Value Ref Range Status   Specimen Description URINE, CATHETERIZED  Final   Special Requests NONE  Final   Culture   Final    NO GROWTH Performed at [place] [address] [place], [place] [address]    Report Status 07/04/2021 FINAL  Final  MRSA Next Gen by PCR, Nasal     Status: None   Collection Time: 07/03/21 12:29 PM   Specimen: Nasal Mucosa; Nasal Swab  Result Value Ref Range Status   MRSA by PCR Next Gen NOT DETECTED NOT DETECTED Final    Comment: (NOTE) The GeneXpert MRSA Assay (FDA approved for NASAL specimens only), is one component of a comprehensive MRSA colonization surveillance program. It is not intended to diagnose MRSA infection nor to guide or monitor treatment for MRSA infections. Test performance is not FDA approved in patients less than 2 years old. Performed at [facility], [place] [address], [place], [place] [address]   Culture, blood (routine x 2)     Status: None (Preliminary result)   Collection Time: 07/03/21 12:53 PM   Specimen: BLOOD  RIGHT HAND  Result Value Ref Range Status   Specimen Description BLOOD RIGHT HAND  Final   Special Requests   Final    BOTTLES DRAWN AEROBIC AND ANAEROBIC Blood Culture adequate volume   Culture   Final    NO GROWTH 3 DAYS Performed at [facility], [place] [address], [place], [place] [address]    Report Status PENDING  Incomplete  Culture, blood (routine x 2)     Status: None (Preliminary result)   Collection Time: 07/03/21  1:08 PM   Specimen: BLOOD LEFT HAND  Result Value Ref Range Status   Specimen Description BLOOD LEFT HAND  Final  Special Requests   Final    BOTTLES DRAWN AEROBIC AND ANAEROBIC Blood Culture adequate volume   Culture   Final    NO GROWTH 3 DAYS Performed at [facility], [place] [address] [place],  [address]    Report Status PENDING  Incomplete      Studies: No results found.  Scheduled Meds:  chlorhexidine  15 mL Mouth Rinse BID   Chlorhexidine Gluconate Cloth  6 each Topical Daily   feeding supplement (PROSource TF)  45 mL Per Tube Daily   insulin aspart  0-9 Units Subcutaneous Q4H   mouth rinse  15 mL Mouth Rinse q12n4p   senna-docusate  1 tablet Per Tube BID   sodium chloride flush  3 mL Intravenous Q12H    Continuous Infusions:  sodium chloride 250 mL (06/29/21 1316)   sodium chloride 50 mL/hr at 07/04/21 2000   ampicillin-sulbactam (UNASYN) IV 3 g (07/06/21 0548)   feeding supplement (OSMOLITE 1.5 CAL) 1,000 mL (07/06/21 0440)     LOS: 7 days     Kayleen Memos, MD [facility] Pager [phone]  If 7PM-7AM, please contact night-coverage [email or web address] Password [facility] 07/06/2021, 3:59 PM

## 2021-07-06 NOTE — Progress Notes (Signed)
Occupational Therapy Treatment Patient Details Name: Brian [name] MRN: [id number] DOB: 1960/07/08 Today's Date: 07/06/2021   History of present illness Pt is a 61 y/o male admitted 06/29/21 following C3-7 ACDF after progressive cervical myelopathy symptoms with subsequent fall and severe incomplete SCI. No significant PMH. 2/8: Status post reexploration of anterior cervical fusion and removal of epidural hematoma.   OT comments  Patient continues to be very motivated to regain function and independence.  OT issued red foam to build up utensils for increased ability to self feed.  OT also trailed large handled spoon and fork bent to reduce need for rotation to get food to his mouth.  Neither was perfect, but he was able to use his right hand, and cued to use his left hand for active assist.  Attempted to support elbows with pillows to reduce shoulder involvement.  Probably closer to Mod A for self feeding.  He continues to gain incremental strength each session.  Patient continues to believe he can transition home with PRN assist from a neighbor, but he needs 24 hour mod to max A for nearly every aspect of ADL and mobility.  AIR is not approved, and OT can only recommend SNF for safety and continued post acute rehab.     Recommendations for follow up therapy are one component of a multi-disciplinary discharge planning process, led by the attending physician.  Recommendations may be updated based on patient status, additional functional criteria and insurance authorization.    Follow Up Recommendations  Skilled nursing-short term rehab (<3 hours/day)    Assistance Recommended at Discharge Frequent or constant Supervision/Assistance  Patient can return home with the following  Two people to help with walking and/or transfers;A lot of help with bathing/dressing/bathroom;Direct supervision/assist for medications management;Assist for transportation;Help with stairs or ramp for entrance;Assistance with  feeding   Equipment Recommendations  Wheelchair (measurements OT);Wheelchair cushion (measurements OT);Hospital bed;BSC/3in1;Tub/shower seat    Recommendations for Other Services      Precautions / Restrictions Precautions Precautions: Fall;Cervical Required Braces or Orthoses: Cervical Brace Cervical Brace: Soft collar;At all times              ADL either performed or assessed with clinical judgement   ADL   Eating/Feeding: Moderate assistance;With adaptive utensils;Sitting Eating/Feeding Details (indicate cue type and reason): modified chair position in bed, elbows supported with pillows to reduce shoulder involvement                                                                                                                                      VSS on RA     Prior Functioning/Environment              Frequency  Min 2X/week        Progress Toward Goals  OT Goals(current goals can now be found in the care plan section)  Progress towards OT goals: Progressing toward goals  Acute  Rehab OT Goals OT Goal Formulation: With patient Time For Goal Achievement: 07/18/21 Potential to Achieve Goals: [place]  Plan Discharge plan remains appropriate    Co-evaluation                 AM-PAC OT "6 Clicks" Daily Activity     Outcome Measure   Help from another person eating meals?: A Lot Help from another person taking care of personal grooming?: A Lot Help from another person toileting, which includes using toliet, bedpan, or urinal?: Total Help from another person bathing (including washing, rinsing, drying)?: A Lot Help from another person to put on and taking off regular upper body clothing?: A Lot Help from another person to put on and taking off regular lower body clothing?: Total 6 Click Score: 10    End of Session Equipment Utilized During Treatment: Cervical collar  OT Visit  Diagnosis: Unsteadiness on feet (R26.81);Muscle weakness (generalized) (M62.81);Ataxia, unspecified (R27.0);Feeding difficulties (R63.3)   Activity Tolerance Patient tolerated treatment well   Patient Left in bed;with call bell/phone within reach   Nurse Communication  Adaptive utensils left in room        Time: 1535-1550 OT Time Calculation (min): 15 min  Charges: OT General Charges $OT Visit: 1 Visit OT Treatments $Self Care/Home Management : 8-22 mins  07/06/2021  RP, OTR/L  Acute Rehabilitation Services  Office:  [phone]   Metta Clines 07/06/2021, 3:54 PM

## 2021-07-06 NOTE — Progress Notes (Signed)
° °  Providing Compassionate, Quality Care - Together   Subjective: Patient reports he wants the feeding tube out of his nose. Nurse questions if patient is becoming delirious.  Objective: Vital signs in last 24 hours: Temp:  [97.6 F (36.4 C)-98.6 F (37 C)] 97.6 F (36.4 C) (02/10 0806) Pulse Rate:  [78-95] 82 (02/10 0806) Resp:  [16-19] 16 (02/10 0806) BP: (140-147)/(86-97) 145/95 (02/10 0806) SpO2:  [92 %-96 %] 95 % (02/10 0806) Weight:  [59.3 kg] 59.3 kg (02/10 0452)  Intake/Output from previous day: 02/09 0701 - 02/10 0700 In: -  Out: 1750 [Urine:1750] Intake/Output this shift: No intake/output data recorded.  Alert and oriented MAE, Strength 4+/5, slightly weaker on the right Neck soft Incision is clean, dry, and intact Soft collar in place External urinary catheter in place. Patient has been voiding  Lab Results: Recent Labs    07/05/21 0336 07/06/21 0346  WBC 10.4 15.3*  HGB 9.2* 9.8*  HCT 27.0* 28.7*  PLT 286 340   BMET Recent Labs    07/05/21 0336 07/06/21 0346  NA 137 134*  K 4.0 3.8  CL 107 100  CO2 23 24  GLUCOSE 166* 121*  BUN 26* 21*  CREATININE 0.67 0.67  CALCIUM 9.3 9.5    Studies/Results: DG Abd Portable 1V  Result Date: 07/04/2021 CLINICAL DATA:  Feeding tube placement EXAM: PORTABLE ABDOMEN - 1 VIEW COMPARISON:  None. FINDINGS: Feeding tube terminates in the antropyloric region. Bilateral airspace disease within both mid and lower lungs. Contrast within the colon. No free intraperitoneal air. IMPRESSION: Feeding tube terminating at the antrum/pyloric region. Electronically Signed   By: Jeronimo Greaves M.D.   On: 07/04/2021 10:37    Assessment/Plan: Patient status post C3-4, C4-5, C5-6, C6-7 anterior cervical discectomy with interbody fusion by Dr. Jordan Likes on 06/29/2021. Increased difficulty swallowing with lung atelectasis and elevated temperatures on 07/02/2021. Made NPO following MBS by SLP. Patient's neuro exam declined 07/03/2021 and he was  found to be quadriparetic at shift change. CTA head and neck negative for stroke. MRI revealed epidural hematoma. Patient underwent exploration of his cervical fusion with evacuation of the epidural hematoma on 07/03/2021. Cortrak was placed on 07/04/2021. Patient's strength much improved since epidural hematoma evacuation.   LOS: 7 days   -Discontinued steroids -Obtain PVR to ensure patient is fully emptying his bladder -Work toward PO diet. Would appreciate patient being reassessed before the weekend. -Patient is working toward discharge to SNF   Val Eagle, DNP, AGNP-C Nurse Practitioner  [facility] [address], [place], [place] [address] P: [phone]     F: [phone]  07/06/2021, 10:17 AM

## 2021-07-06 NOTE — Progress Notes (Signed)
Modified Barium Swallow Progress Note  Patient Details  Name: Brian [name] MRN: [id number] Date of Birth: Oct 24, 1960  Today's Date: 07/06/2021  Modified Barium Swallow completed.  Full report located under Chart Review in the Imaging Section.  Brief recommendations include the following:  Clinical Impression  Pt's pharyngeal swallow function has improved from prior study with significantly reduced pharyngeal edema. There was no aspiration but laryngeal penetration with cup and straw sip thin (PAS 3) due to decreased laryngeal closure. Intentional coughs were unsuccessful to clear and cervical collar prevented chin tuck. Nectar thick increased coordination of overall swallow did not enter airway. Residual in valleculae and pyriform sinuses was minimal. Oral mastication and manipulation were mildly delayed. Pt may initiate Dys 2, nectar thick liquids, pills crushed in puree and assist with feeding. ST will follow.   Swallow Evaluation Recommendations       SLP Diet Recommendations: Dysphagia 2 (Fine chop) solids;Nectar thick liquid   Liquid Administration via: Cup;Straw   Medication Administration: Crushed with puree   Supervision: Full supervision/cueing for compensatory strategies;Staff to assist with self feeding   Compensations: Slow rate;Small sips/bites;Minimize environmental distractions;Clear throat intermittently   Postural Changes: Seated upright at 90 degrees   Oral Care Recommendations: Oral care BID        [name] 07/06/2021,4:18 PM Breck Coons [place].Ed Nurse, children's [phone] Office [phone]

## 2021-07-07 ENCOUNTER — Other Ambulatory Visit (HOSPITAL_COMMUNITY): Payer: Managed Care, Other (non HMO)

## 2021-07-07 ENCOUNTER — Inpatient Hospital Stay (HOSPITAL_COMMUNITY): Payer: 59

## 2021-07-07 DIAGNOSIS — R569 Unspecified convulsions: Secondary | ICD-10-CM

## 2021-07-07 DIAGNOSIS — G959 Disease of spinal cord, unspecified: Secondary | ICD-10-CM | POA: Diagnosis not present

## 2021-07-07 LAB — CBC WITH DIFFERENTIAL/PLATELET
Abs Immature Granulocytes: 0.19 10*3/uL — ABNORMAL HIGH (ref 0.00–0.07)
Basophils Absolute: 0 10*3/uL (ref 0.0–0.1)
Basophils Relative: 0 %
Eosinophils Absolute: 0.3 10*3/uL (ref 0.0–0.5)
Eosinophils Relative: 2 %
HCT: 31.9 % — ABNORMAL LOW (ref 39.0–52.0)
Hemoglobin: 11 g/dL — ABNORMAL LOW (ref 13.0–17.0)
Immature Granulocytes: 1 %
Lymphocytes Relative: 16 %
Lymphs Abs: 2.2 10*3/uL (ref 0.7–4.0)
MCH: 33.4 pg (ref 26.0–34.0)
MCHC: 34.5 g/dL (ref 30.0–36.0)
MCV: 97 fL (ref 80.0–100.0)
Monocytes Absolute: 1.3 10*3/uL — ABNORMAL HIGH (ref 0.1–1.0)
Monocytes Relative: 10 %
Neutro Abs: 9.4 10*3/uL — ABNORMAL HIGH (ref 1.7–7.7)
Neutrophils Relative %: 71 %
Platelets: 414 10*3/uL — ABNORMAL HIGH (ref 150–400)
RBC: 3.29 MIL/uL — ABNORMAL LOW (ref 4.22–5.81)
RDW: 13.5 % (ref 11.5–15.5)
WBC: 13.4 10*3/uL — ABNORMAL HIGH (ref 4.0–10.5)
nRBC: 1.2 % — ABNORMAL HIGH (ref 0.0–0.2)

## 2021-07-07 LAB — BLOOD GAS, ARTERIAL
Acid-Base Excess: 0 mmol/L (ref 0.0–2.0)
Bicarbonate: 23.4 mmol/L (ref 20.0–28.0)
Drawn by: 519031
FIO2: 21
O2 Saturation: 95.6 %
Patient temperature: 36.6
pCO2 arterial: 32.4 mmHg (ref 32.0–48.0)
pH, Arterial: 7.469 — ABNORMAL HIGH (ref 7.350–7.450)
pO2, Arterial: 74.9 mmHg — ABNORMAL LOW (ref 83.0–108.0)

## 2021-07-07 LAB — CBC
HCT: 31.6 % — ABNORMAL LOW (ref 39.0–52.0)
Hemoglobin: 10.7 g/dL — ABNORMAL LOW (ref 13.0–17.0)
MCH: 32.9 pg (ref 26.0–34.0)
MCHC: 33.9 g/dL (ref 30.0–36.0)
MCV: 97.2 fL (ref 80.0–100.0)
Platelets: 427 10*3/uL — ABNORMAL HIGH (ref 150–400)
RBC: 3.25 MIL/uL — ABNORMAL LOW (ref 4.22–5.81)
RDW: 13.3 % (ref 11.5–15.5)
WBC: 18 10*3/uL — ABNORMAL HIGH (ref 4.0–10.5)
nRBC: 0.8 % — ABNORMAL HIGH (ref 0.0–0.2)

## 2021-07-07 LAB — VITAMIN B12: Vitamin B-12: 1328 pg/mL — ABNORMAL HIGH (ref 180–914)

## 2021-07-07 LAB — BASIC METABOLIC PANEL
Anion gap: 11 (ref 5–15)
BUN: 19 mg/dL (ref 6–20)
CO2: 24 mmol/L (ref 22–32)
Calcium: 9.7 mg/dL (ref 8.9–10.3)
Chloride: 100 mmol/L (ref 98–111)
Creatinine, Ser: 0.79 mg/dL (ref 0.61–1.24)
GFR, Estimated: >60 mL/min (ref >60)
Glucose, Bld: 110 mg/dL — ABNORMAL HIGH (ref 70–99)
Potassium: 3.3 mmol/L — ABNORMAL LOW (ref 3.5–5.1)
Sodium: 135 mmol/L (ref 135–145)

## 2021-07-07 LAB — PROCALCITONIN: Procalcitonin: 0.27 ng/mL

## 2021-07-07 LAB — COMPREHENSIVE METABOLIC PANEL
ALT: 209 U/L — ABNORMAL HIGH (ref 0–44)
AST: 157 U/L — ABNORMAL HIGH (ref 15–41)
Albumin: 2.9 g/dL — ABNORMAL LOW (ref 3.5–5.0)
Alkaline Phosphatase: 98 U/L (ref 38–126)
Anion gap: 9 (ref 5–15)
BUN: 15 mg/dL (ref 6–20)
CO2: 23 mmol/L (ref 22–32)
Calcium: 9.4 mg/dL (ref 8.9–10.3)
Chloride: 101 mmol/L (ref 98–111)
Creatinine, Ser: 0.7 mg/dL (ref 0.61–1.24)
GFR, Estimated: >60 mL/min (ref >60)
Glucose, Bld: 100 mg/dL — ABNORMAL HIGH (ref 70–99)
Potassium: 3.7 mmol/L (ref 3.5–5.1)
Sodium: 133 mmol/L — ABNORMAL LOW (ref 135–145)
Total Bilirubin: 1.1 mg/dL (ref 0.3–1.2)
Total Protein: 8 g/dL (ref 6.5–8.1)

## 2021-07-07 LAB — PHOSPHORUS: Phosphorus: 3 mg/dL (ref 2.5–4.6)

## 2021-07-07 LAB — GLUCOSE, CAPILLARY
Glucose-Capillary: 120 mg/dL — ABNORMAL HIGH (ref 70–99)
Glucose-Capillary: 89 mg/dL (ref 70–99)

## 2021-07-07 LAB — TSH: TSH: 4.004 u[IU]/mL (ref 0.350–4.500)

## 2021-07-07 LAB — MAGNESIUM: Magnesium: 1.8 mg/dL (ref 1.7–2.4)

## 2021-07-07 LAB — AMMONIA: Ammonia: 34 umol/L (ref 9–35)

## 2021-07-07 LAB — LACTIC ACID, PLASMA
Lactic Acid, Venous: 2.2 mmol/L (ref 0.5–1.9)
Lactic Acid, Venous: 1.2 mmol/L (ref 0.5–1.9)

## 2021-07-07 IMAGING — CT CT HEAD W/O CM
4 series · 16 of 47 positions shown, 18 images · non-contrast
Comparison: [DATE]

CLINICAL DATA: Altered mental status



[Series 7: head wo · axial · 0.43mm/px · z∈[-53,+67]mm · 7 of 34 slices shown, 9 images]
[im 5/34  brain]
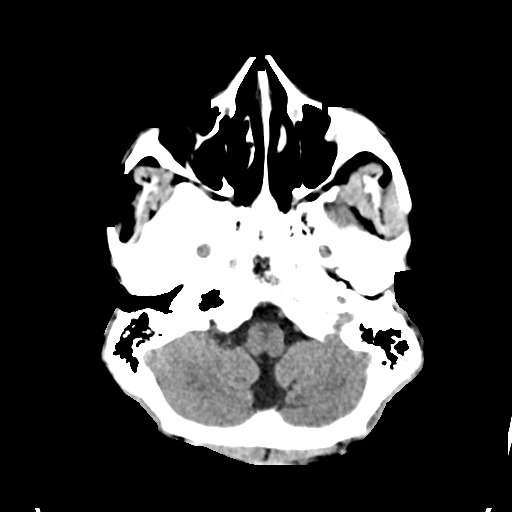
[im 5/34  bone]
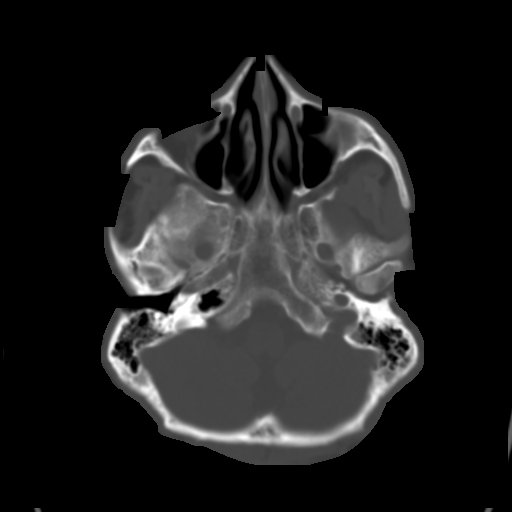
[im 9/34  brain]
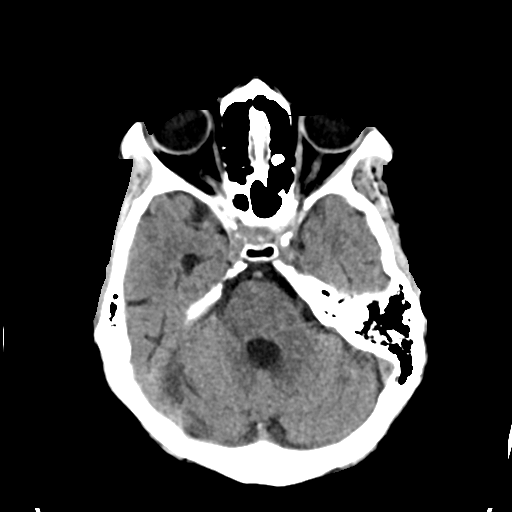
[im 13/34  brain]
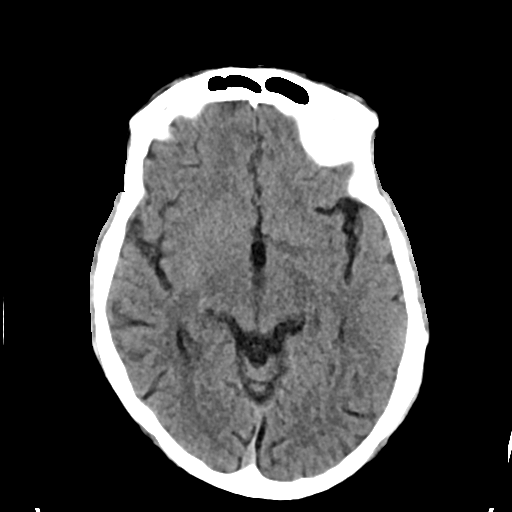
[im 17/34  brain]
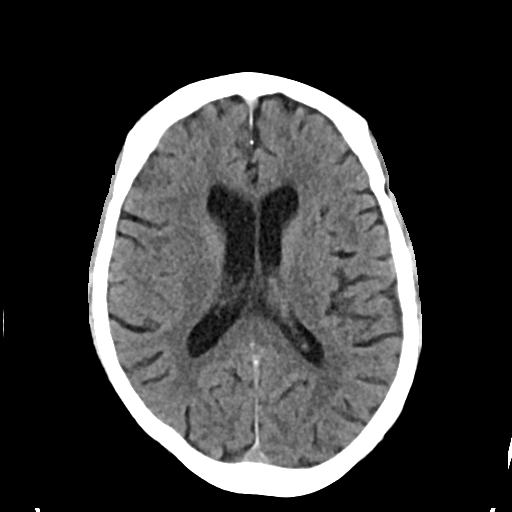
[im 21/34  brain]
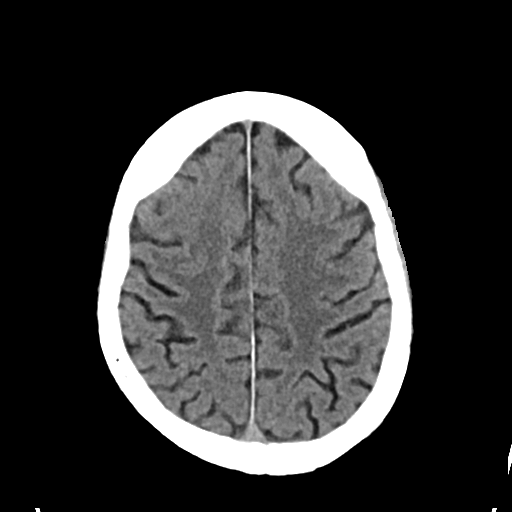
[im 21/34  bone]
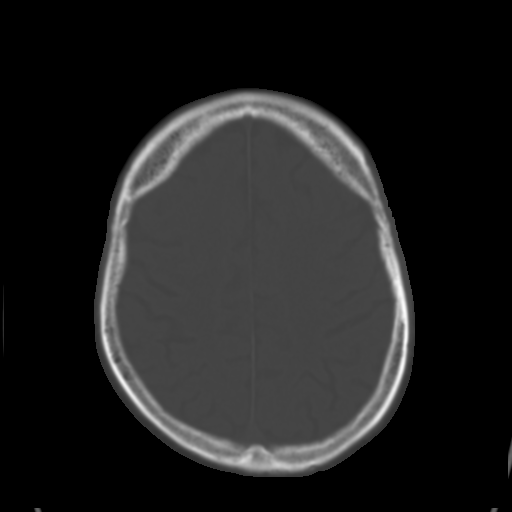
[im 25/34  brain]
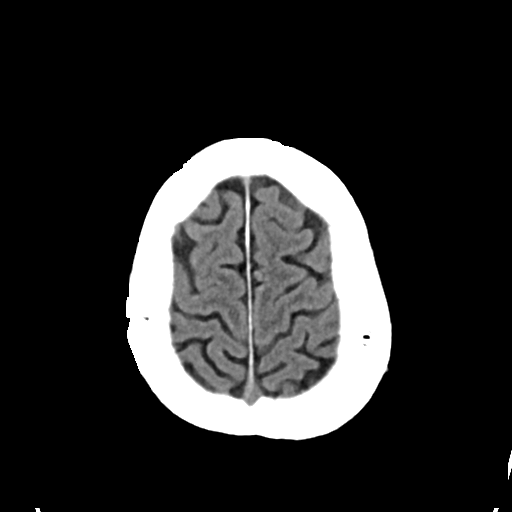
[im 29/34  brain]
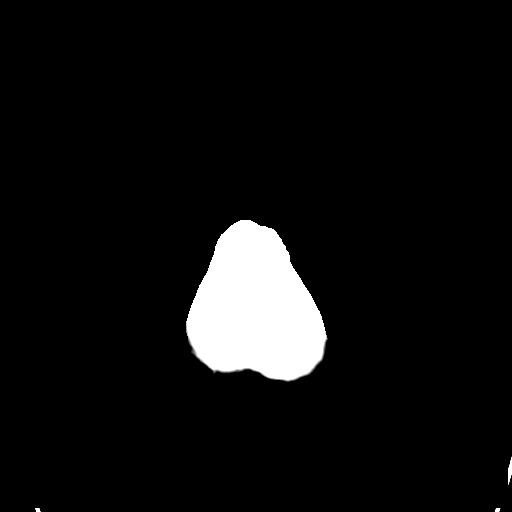

[Series 8: head bone · axial · 0.43mm/px · z∈[-57,-23]mm · 3 of 85 slices shown]
[im 9/85  bone]
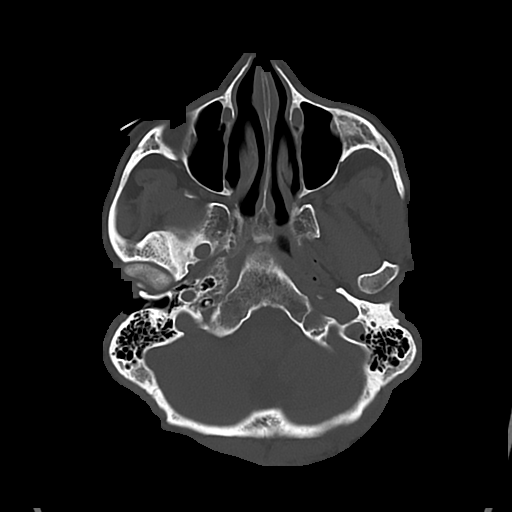
[im 17/85  bone]
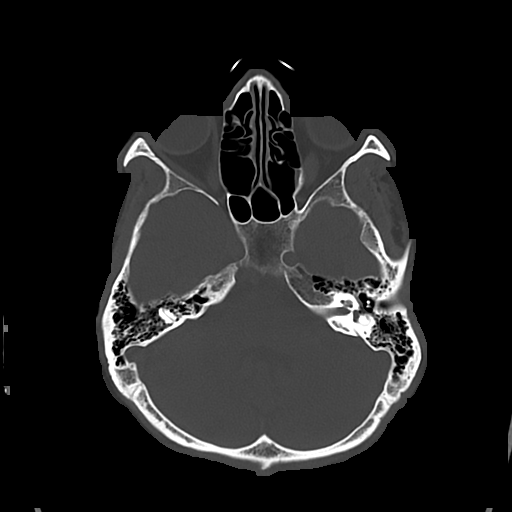
[im 26/85  bone]
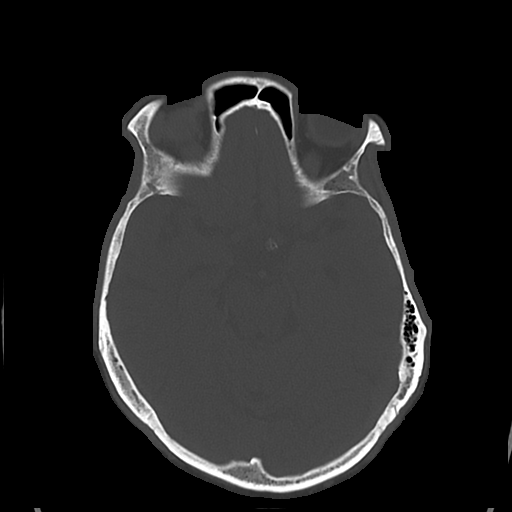

[Series 9: cor soft · coronal · 0.31mm/px · 3 of 69 slices shown]
[im 23/69  brain]
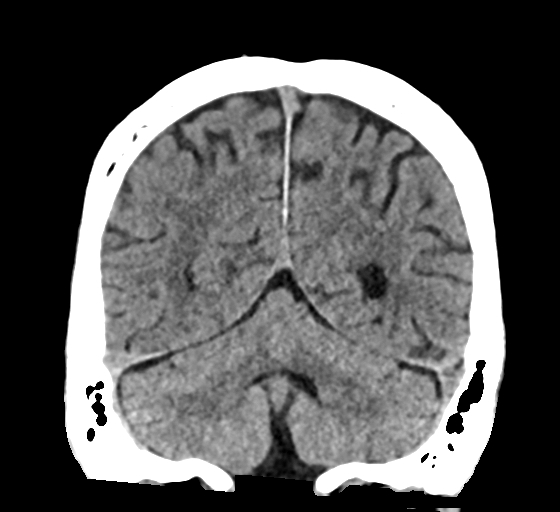
[im 31/69  brain]
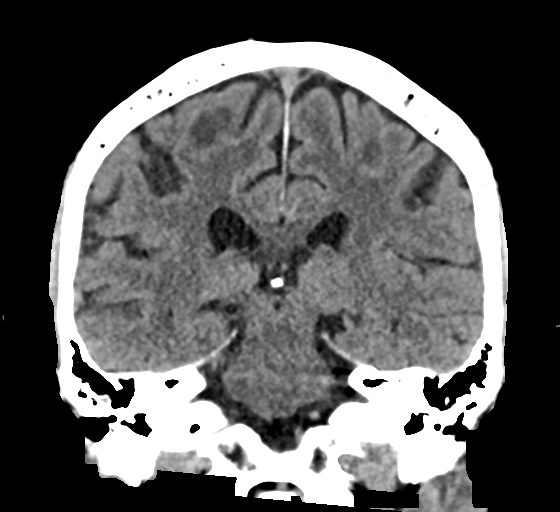
[im 38/69  brain]
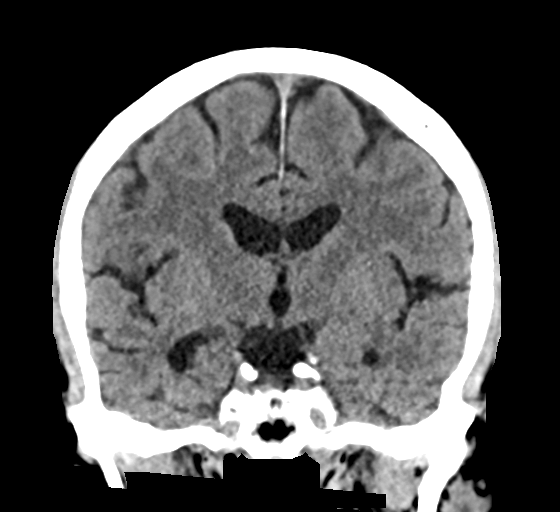

[Series 10: sag soft · sagittal · 0.32mm/px · 3 of 58 slices shown]
[im 20/58  brain]
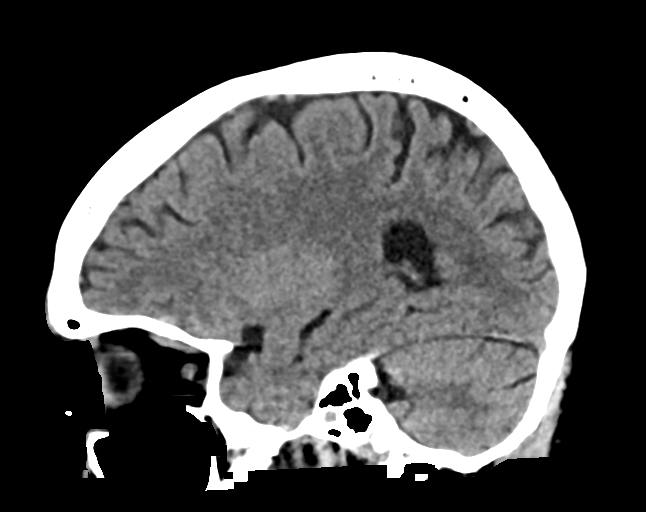
[im 29/58  brain]
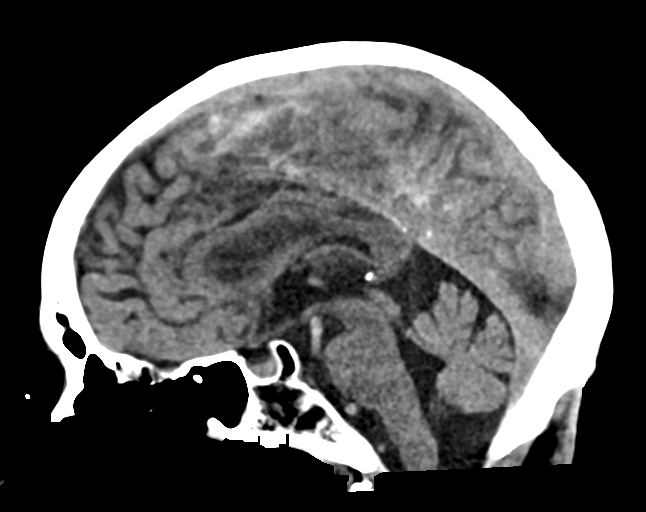
[im 39/58  brain]
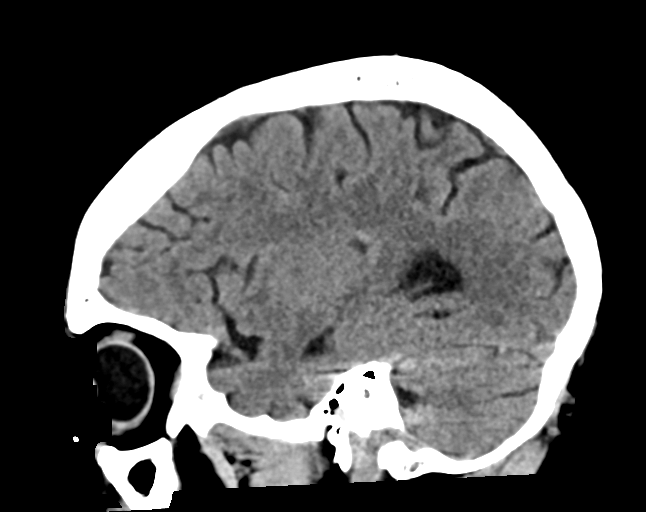

[16 of 47 positions shown; findings below may reference images not displayed]

FINDINGS: Brain: No evidence of acute infarction, hemorrhage, hydrocephalus,
extra-axial collection or mass lesion/mass effect.

Vascular: No hyperdense vessel or unexpected calcification.

Skull: Normal. Negative for fracture or focal lesion.

Sinuses/Orbits: Incidentally noted 6 mm left ethmoid osteoma.
Paranasal sinuses and mastoid air cells otherwise clear.

Other: None.
IMPRESSION: No acute intracranial abnormality.

## 2021-07-07 IMAGING — MR MR HEAD W/O CM
10 series · 48 of 48 positions shown · non-contrast
Comparison: Head CT [DATE] and MRI [DATE]

CLINICAL DATA: Altered mental status.

EXAM:
MRI HEAD WITHOUT CONTRAST
TECHNIQUE: Multiplanar, multiecho pulse sequences of the brain and surrounding
structures were obtained without intravenous contrast.

[Series 5: DWI · axial · 3.0mm · 0.88mm/px · z∈[-121,+29]mm · 12 of 104 slices shown (1 of 4)]
[im 1/104]
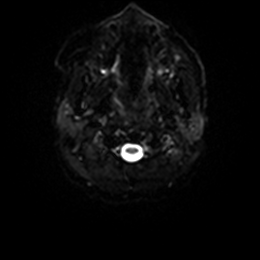
[im 10/104]
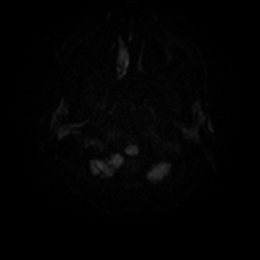
[im 19/104]
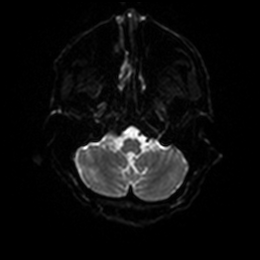
[im 29/104]
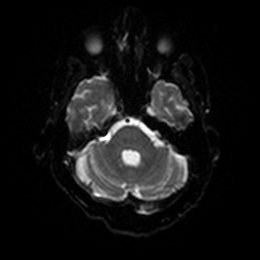
[im 38/104]
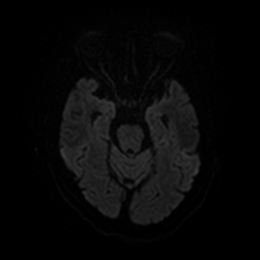
[im 47/104]
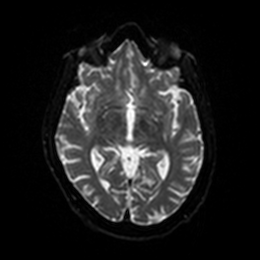
[im 57/104]
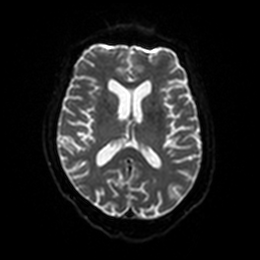
[im 66/104]
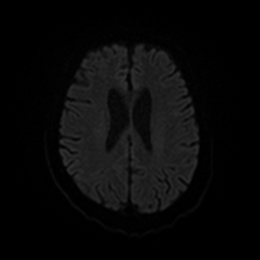
[im 75/104]
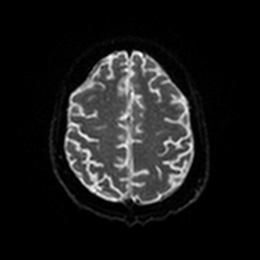
[im 85/104]
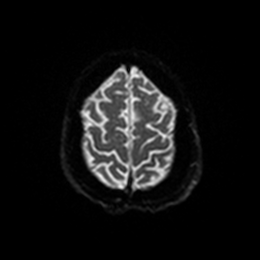
[im 94/104]
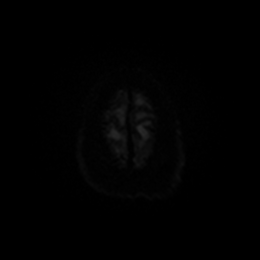
[im 104/104]
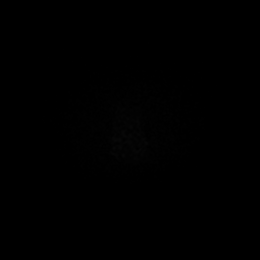

[Series 6: DWI · axial · 3.0mm · 0.88mm/px · z∈[-121,+29]mm · 6 of 51 slices shown (2 of 4)]
[im 1/51]
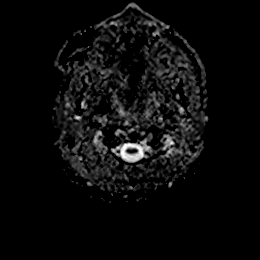
[im 11/51]
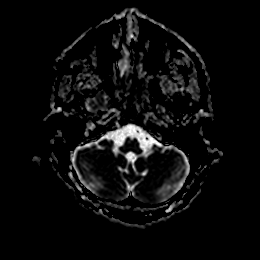
[im 21/51]
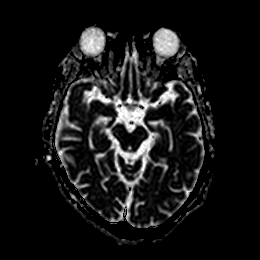
[im 31/51]
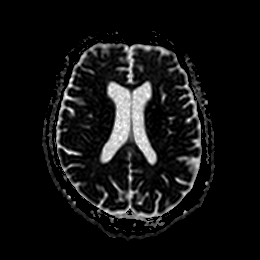
[im 41/51]
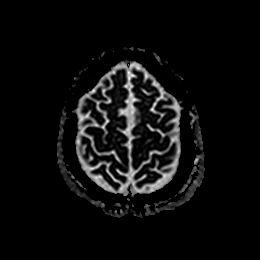
[im 51/51]
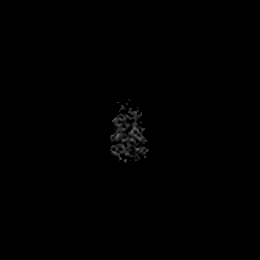

[Series 7: DWI · coronal · 4.0mm · 0.88mm/px · 8 of 74 slices shown (3 of 4)]
[im 1/74]
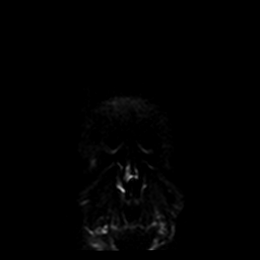
[im 11/74]
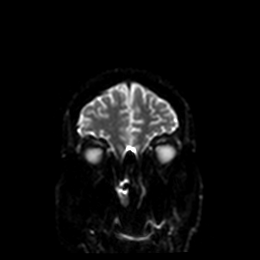
[im 21/74]
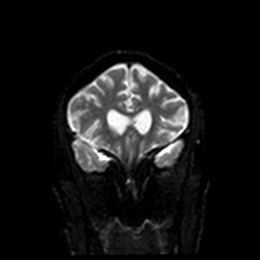
[im 32/74]
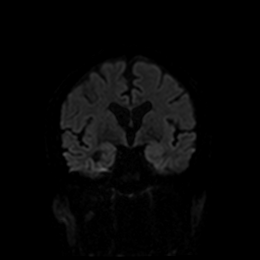
[im 42/74]
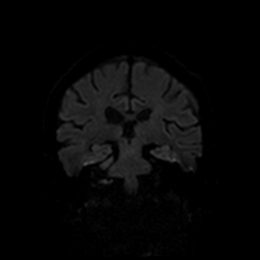
[im 53/74]
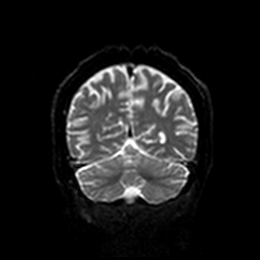
[im 63/74]
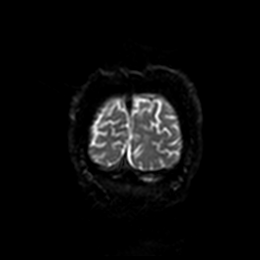
[im 74/74]
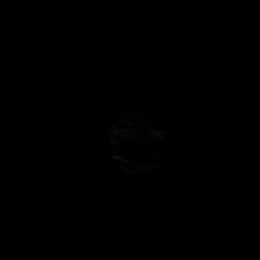

[Series 8: DWI · coronal · 4.0mm · 0.88mm/px · 4 of 37 slices shown (4 of 4)]
[im 1/37]
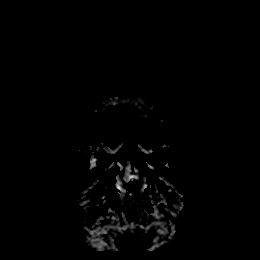
[im 13/37]
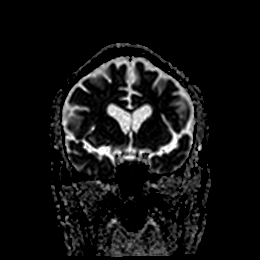
[im 25/37]
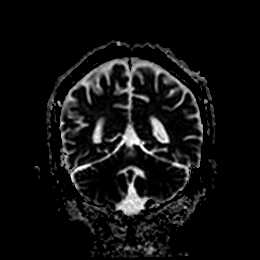
[im 37/37]
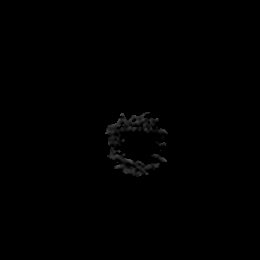

[Series 9: FLAIR · axial · 5.0mm · 0.90mm/px · z∈[-119,+27]mm · 3 of 26 slices shown (1 of 2)]
[im 1/26]
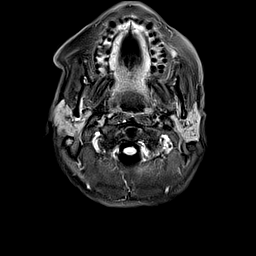
[im 13/26]
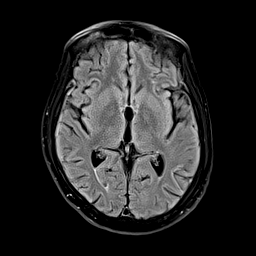
[im 26/26]
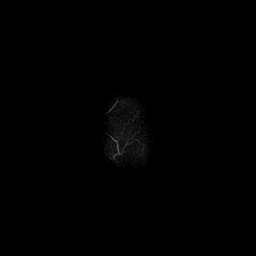

[Series 10: ax hemo · axial · 5.0mm · 0.86mm/px · z∈[-119,+27]mm · 3 of 26 slices shown]
[im 1/26]
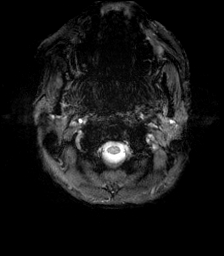
[im 13/26]
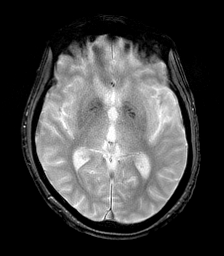
[im 26/26]
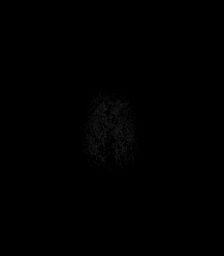

[Series 11: T1 · sagittal · 5.0mm · 0.90mm/px · 3 of 23 slices shown]
[im 1/23]
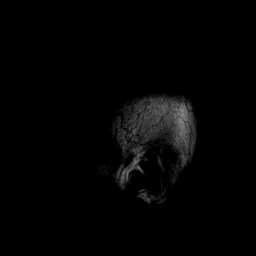
[im 12/23]
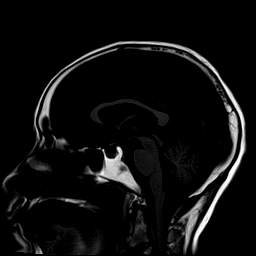
[im 23/23]
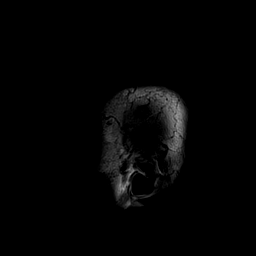

[Series 12: T2 · axial · 5.0mm · 0.72mm/px · z∈[-119,+27]mm · 3 of 26 slices shown (1 of 2)]
[im 1/26]
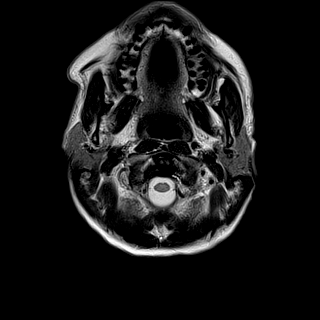
[im 13/26]
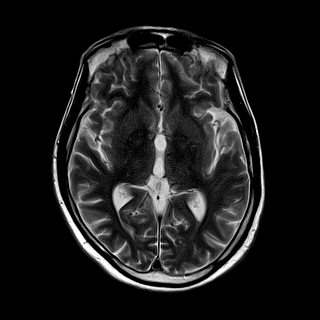
[im 26/26]
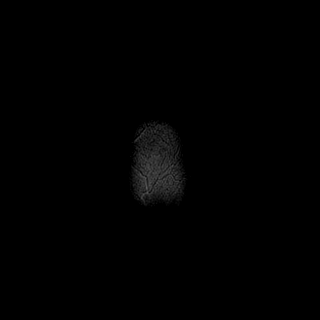

[Series 13: FLAIR · axial · 5.0mm · 0.90mm/px · z∈[-119,+27]mm · 3 of 26 slices shown (2 of 2)]
[im 1/26]
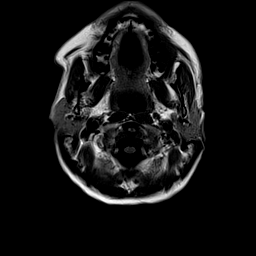
[im 13/26]
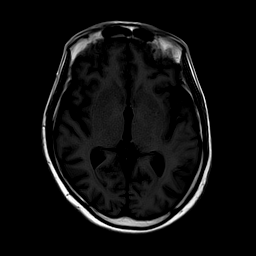
[im 26/26]
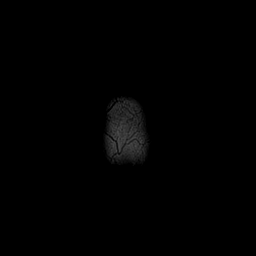

[Series 14: T2 · coronal · 5.0mm · 0.72mm/px · 3 of 28 slices shown (2 of 2)]
[im 1/28]
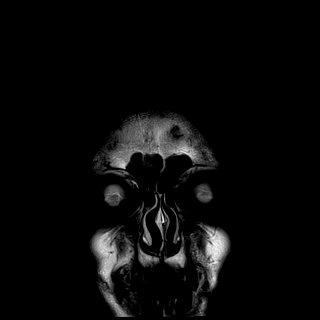
[im 14/28]
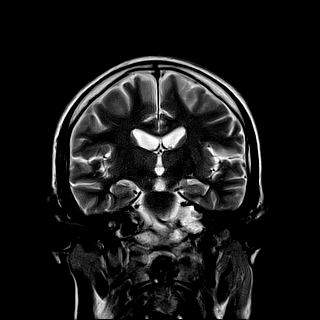
[im 28/28]
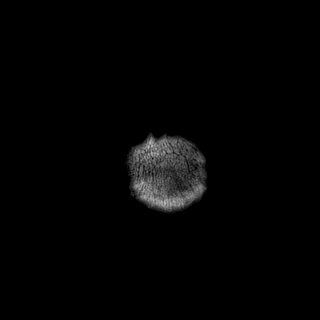

[48 of 48 positions shown; findings below may reference images not displayed]

FINDINGS: Brain: There is no evidence of an acute infarct, intracranial
hemorrhage, mass, midline shift, or extra-axial fluid collection. A
punctate focus of trace diffusion weighted signal hyperintensity
without reduced ADC in the left occipital lobe is unchanged. The
ventricles and sulci are within normal limits for age. Scattered
punctate T2 hyperintensities in the cerebral white matter
bilaterally are unchanged and nonspecific but compatible with
minimal chronic small vessel ischemic disease.

Vascular: Major intracranial vascular flow voids are preserved.

Skull and upper cervical spine: Unremarkable bone marrow signal para

Sinuses/Orbits: Unremarkable orbits. Paranasal sinuses and mastoid
air cells are clear.

Other: None.
IMPRESSION: 1. No acute intracranial abnormality.
2. Minimal chronic small vessel ischemic disease.

## 2021-07-07 MED ORDER — POTASSIUM CHLORIDE 20 MEQ PO PACK
40.0000 meq | PACK | Freq: Two times a day (BID) | ORAL | Status: AC
  Administered 2021-07-07: 40 meq via ORAL
  Filled 2021-07-07: qty 2

## 2021-07-07 MED ORDER — LORAZEPAM 1 MG PO TABS: 1.0000 mg | ORAL_TABLET | ORAL | Status: DC | PRN

## 2021-07-07 MED ORDER — CHLORHEXIDINE GLUCONATE CLOTH 2 % EX PADS
6.0000 | MEDICATED_PAD | Freq: Every day | CUTANEOUS | Status: DC
  Administered 2021-07-07 – 2021-07-25 (×17): 6 via TOPICAL

## 2021-07-07 MED ORDER — METOPROLOL TARTRATE 5 MG/5ML IV SOLN
5.0000 mg | Freq: Once | INTRAVENOUS | Status: AC
Start: 2021-07-07 — End: 2021-07-07
  Administered 2021-07-07: 5 mg via INTRAVENOUS
  Filled 2021-07-07: qty 5

## 2021-07-07 MED ORDER — LORAZEPAM 1 MG PO TABS: 0.0000 mg | ORAL_TABLET | ORAL | Status: DC

## 2021-07-07 MED ORDER — TAMSULOSIN HCL 0.4 MG PO CAPS
0.4000 mg | ORAL_CAPSULE | Freq: Every day | ORAL | Status: DC
  Administered 2021-07-09 – 2021-07-15 (×7): 0.4 mg via ORAL
  Filled 2021-07-07 (×7): qty 1

## 2021-07-07 MED ORDER — THIAMINE HCL 100 MG PO TABS: 100.0000 mg | ORAL_TABLET | Freq: Every day | ORAL | Status: DC

## 2021-07-07 MED ORDER — LORAZEPAM 2 MG/ML IJ SOLN
1.0000 mg | INTRAMUSCULAR | Status: DC | PRN
  Administered 2021-07-07: 2 mg via INTRAVENOUS
  Filled 2021-07-07: qty 1
  Filled 2021-07-07: qty 2

## 2021-07-07 MED ORDER — LORAZEPAM 2 MG/ML IJ SOLN
2.0000 mg | INTRAMUSCULAR | Status: AC
  Administered 2021-07-07: 2 mg via INTRAVENOUS
  Filled 2021-07-07: qty 1

## 2021-07-07 MED ORDER — THIAMINE HCL 100 MG/ML IJ SOLN: 100.0000 mg | Freq: Every day | INTRAMUSCULAR | Status: DC

## 2021-07-07 MED ORDER — ADULT MULTIVITAMIN W/MINERALS CH: 1.0000 | ORAL_TABLET | Freq: Every day | ORAL | Status: DC

## 2021-07-07 MED ORDER — FOLIC ACID 1 MG PO TABS: 1.0000 mg | ORAL_TABLET | Freq: Every day | ORAL | Status: DC

## 2021-07-07 MED ORDER — LACTATED RINGERS IV SOLN: INTRAVENOUS | Status: AC

## 2021-07-07 MED ORDER — LORAZEPAM 2 MG/ML IJ SOLN
1.0000 mg | INTRAMUSCULAR | Status: DC
  Administered 2021-07-07: 1 mg via INTRAVENOUS
  Filled 2021-07-07: qty 1

## 2021-07-07 MED ORDER — LORAZEPAM 1 MG PO TABS: 0.0000 mg | ORAL_TABLET | Freq: Three times a day (TID) | ORAL | Status: DC

## 2021-07-07 NOTE — Progress Notes (Signed)
PROGRESS NOTE  Brian [name] [id number] DOB: [birth year] DOA: 06/29/2021 PCP: Pcp, No  HPI/Recap of past 62 hours:  61 year old male with prior history of tobacco abuse and ETOH use who recently was found to have critical multilevel cervical spinal stenosis C3-6 with spinal cord compression and spinal cord signal change after recent fall with progressive upper and lower extremity weakness and tremors.  He was admitted on 2/3 and underwent ACDF of C3-4, C4-5, C5-5, and C6-7.  Post-operatively he was progressing slowly with residual weakness in both hands but improving lower extremity strength and function.  He developed some difficulty swallowing on 2/5 and SLP ordered and placed on thickened liquids.   Overnight 2/6, patient with increased difficulty swallowing and was made NPO but also noted to have dysarthria and difficulty moving extremities with numbness.  SLP evaluated and found to be moderate aspiration risk.   Also progressively tachycardic, tachypneic, and now febrile.  Code stroke activated and taken for CT/ CTA head and neck and was given decadron 10mg  once.  NIHSS 18.  CTH was negative for acute findings, and CTA head neck did not reveal any LVO but noted significant prevertebral soft tissue swelling with foci of gas present at the ventral epidural space at C4-5, small collection not excluded.  On 2/7, patient with worsening confusion and concern for airway involvement, PCCM consulted for further evaluation.  Patient underwent exploration of a cervical fusion with evacuation of the epidural hematoma on 07/03/2021.  Culture was placed on 07/04/2021 and removed on 07/06/2021.  CIWA protocol started on 07/07/2021 by primary team due to concern for alcohol withdrawal.  07/07/2021: Seen and examined at bedside.  Denies any abdominal pain.  Soft.  Bowel sounds present.  Reported multiple stools and acute urinary retention by nursing staff.  Assessment/Plan: Principal Problem:   Cervical myelopathy  (HCC) Active Problems:   Malnutrition of moderate degree  Sepsis secondary to aspiration pneumonia  Unasyn DC'd on 07/07/2021, on day 4. Maintain O2 saturation greater than 92%. Continue aspiration precautions. Lactic acid elevated at 2.2 on 07/07/2021.  Trend lactic acid.   Severe cervical spinal stenosis with spinal cord compression s/p ACDF C3-C7 on 2/3 c/b epidural hematoma s/p evacuation 2/7 Exam drastically improved s/p evacuation.  NSGY evaluation Speech evaluation, may need cortrak Continue aspiration precautions   Leukocytosis with recent IV steroids Now off IV Decadron, afebrile. Continue to monitor WBC and fever curve.  Loose stools, afebrile Monitor for now off antibiotics and off IV steroids Monitor fever curve and WBC.  Elevated lactic acid, suspect contributed by dehydration Restart gentle IV fluid hydration LR 50 cc/h x 2 days and trend lactic acid. Nonseptic appearing.  Questionable alcohol withdrawal Out of window for withdrawal We will continue to monitor Continue multivitamins, thiamine, folic acid supplements.   Daily tobacco use  Tobacco cessation education when appropriate   Physical debility Continue fall precautions PT OT assessment recommending SNF CSW assisting with SNF placement.  Highly appreciated.  Severe protein calorie malnutrition BMI 18 Severe muscle mass loss Dietitian consult  Hyperglycemia likely secondary to ongoing steroid use Hemoglobin A1c 5.2 on 07/05/2021. Avoid hypoglycemia   Code Status: Full code  Family Communication: None at bedside  Disposition Plan: Likely will discharge to SNF.  Thank you for allowing [place] to participate in the care of this patient.  We will continue to follow along with you.       Objective: Vitals:   07/06/21 1942 07/07/21 0500 07/07/21 0813 07/07/21 1113  BP: (!) 132/95  137/89 (P) 126/90  Pulse: 97  99   Resp: 20  16   Temp: 98.4 F (36.9 C)  98.5 F (36.9 C)   TempSrc: Oral   Oral   SpO2: 98%  100%   Weight:  59.3 kg    Height:        Intake/Output Summary (Last 24 hours) at 07/07/2021 1230 Last data filed at 07/07/2021 [id number] Gross per 24 hour  Intake 3 ml  Output 1550 ml  Net -1547 ml   Filed Weights   07/05/21 0500 07/06/21 0452 07/07/21 0500  Weight: 58 kg 59.3 kg 59.3 kg    Exam:  General: 61 y.o. year-old male frail-appearing in no acute distress.  He is alert and interactive.   Cardiovascular: Regular rate and rhythm no rubs or gallops. Respiratory: Clear to auscultation no wheezes or rales. Abdomen: Soft nontender bowel sounds present. Musculoskeletal: No lower extremity edema bilaterally. Skin: No ulcerative lesions noted. Psychiatry: Mood is appropriate.      .   Data Reviewed: CBC: Recent Labs  Lab 07/02/21 0515 07/03/21 0343 07/04/21 0504 07/05/21 0336 07/06/21 0346 07/07/21 0312  WBC 16.3* 11.3* 12.9* 10.4 15.3* 18.0*  NEUTROABS 11.2*  --   --   --   --   --   HGB 11.0* 9.9* 9.1* 9.2* 9.8* 10.7*  HCT 32.6* 30.0* 26.5* 27.0* 28.7* 31.6*  MCV 101.6* 101.0* 98.9 99.3 96.6 97.2  PLT 189 231 259 286 340 [id number]*   Basic Metabolic Panel: Recent Labs  Lab 07/03/21 0343 07/04/21 0504 07/05/21 0336 07/06/21 0346 07/07/21 0312  NA 135 134* 137 134* 135  K 4.1 4.0 4.0 3.8 3.3*  CL 102 102 107 100 100  CO2 21* 22 23 24 24   GLUCOSE 85 122* 166* 121* 110*  BUN 16 20 26* 21* 19  CREATININE 0.77 0.75 0.67 0.67 0.79  CALCIUM 9.4 9.3 9.3 9.5 9.7   GFR: Estimated Creatinine Clearance: 82.4 mL/min (by C-G formula based on SCr of 0.79 mg/dL). Liver Function Tests: No results for input(s): AST, ALT, ALKPHOS, BILITOT, PROT, ALBUMIN in the last 168 hours. No results for input(s): LIPASE, AMYLASE in the last 168 hours. No results for input(s): AMMONIA in the last 168 hours. Coagulation Profile: No results for input(s): INR, PROTIME in the last 168 hours. Cardiac Enzymes: No results for input(s): CKTOTAL, CKMB, CKMBINDEX, TROPONINI  in the last 168 hours. BNP (last 3 results) No results for input(s): PROBNP in the last 8760 hours. HbA1C: Recent Labs    07/05/21 0339  HGBA1C 5.2   CBG: Recent Labs  Lab 07/06/21 1548 07/06/21 2011 07/06/21 2315 07/07/21 0308 07/07/21 0809  GLUCAP 104* 108* 103* 120* 89   Lipid Profile: No results for input(s): CHOL, HDL, LDLCALC, TRIG, CHOLHDL, LDLDIRECT in the last 72 hours. Thyroid Function Tests: No results for input(s): TSH, T4TOTAL, FREET4, T3FREE, THYROIDAB in the last 72 hours. Anemia Panel: No results for input(s): VITAMINB12, FOLATE, FERRITIN, TIBC, IRON, RETICCTPCT in the last 72 hours. Urine analysis:    Component Value Date/Time   COLORURINE YELLOW 07/03/2021 1110   APPEARANCEUR CLEAR 07/03/2021 1110   LABSPEC 1.010 07/03/2021 1110   PHURINE 6.0 07/03/2021 1110   GLUCOSEU NEGATIVE 07/03/2021 1110   HGBUR TRACE (A) 07/03/2021 1110   BILIRUBINUR NEGATIVE 07/03/2021 1110   KETONESUR >80 (A) 07/03/2021 1110   PROTEINUR NEGATIVE 07/03/2021 1110   NITRITE NEGATIVE 07/03/2021 1110   LEUKOCYTESUR NEGATIVE 07/03/2021 1110   Sepsis Labs: @LABRCNTIP (procalcitonin:4,lacticidven:4)  )  Recent Results (from the past 240 hour(s))  SARS Coronavirus 2 by RT PCR (hospital order, performed in [facility] hospital lab) Nasopharyngeal Nasopharyngeal Swab     Status: None   Collection Time: 06/29/21  6:00 AM   Specimen: Nasopharyngeal Swab  Result Value Ref Range Status   SARS Coronavirus 2 NEGATIVE NEGATIVE Final    Comment: (NOTE) SARS-CoV-2 target nucleic acids are NOT DETECTED.  The SARS-CoV-2 RNA is generally detectable in upper and lower respiratory specimens during the acute phase of infection. The lowest concentration of SARS-CoV-2 viral copies this assay can detect is 250 copies / mL. A negative result does not preclude SARS-CoV-2 infection and should not be used as the sole basis for treatment or other patient management decisions.  A negative result may  occur with improper specimen collection / handling, submission of specimen other than nasopharyngeal swab, presence of viral mutation(s) within the areas targeted by this assay, and inadequate number of viral copies (<250 copies / mL). A negative result must be combined with clinical observations, patient history, and epidemiological information.  Fact Sheet for Patients:   StrictlyIdeas.no  Fact Sheet for Healthcare Providers: BankingDealers.co.za  This test is not yet approved or  cleared by the [place] FDA and has been authorized for detection and/or diagnosis of SARS-CoV-2 by FDA under an Emergency Use Authorization (EUA).  This EUA will remain in effect (meaning this test can be used) for the duration of the COVID-19 declaration under Section 564(b)(1) of the Act, 21 U.S.C. section 360bbb-3(b)(1), unless the authorization is terminated or revoked sooner.  Performed at [facility], [place] [address] [place], [place] [address]   Surgical PCR Screen     Status: None   Collection Time: 06/29/21  6:16 AM   Specimen: Nasal Mucosa; Nasal Swab  Result Value Ref Range Status   MRSA, PCR NEGATIVE NEGATIVE Final   Staphylococcus aureus NEGATIVE NEGATIVE Final    Comment: (NOTE) The Xpert SA Assay (FDA approved for NASAL specimens in patients 82 years of age and older), is one component of a comprehensive surveillance program. It is not intended to diagnose infection nor to guide or monitor treatment. Performed at [facility], [place] [address], [place], [place] [address]   Urine Culture     Status: None   Collection Time: 07/03/21 11:10 AM   Specimen: Urine, Catheterized  Result Value Ref Range Status   Specimen Description URINE, CATHETERIZED  Final   Special Requests NONE  Final   Culture   Final    NO GROWTH Performed at [place] [address], [place], [place] [address]    Report Status 07/04/2021  FINAL  Final  MRSA Next Gen by PCR, Nasal     Status: None   Collection Time: 07/03/21 12:29 PM   Specimen: Nasal Mucosa; Nasal Swab  Result Value Ref Range Status   MRSA by PCR Next Gen NOT DETECTED NOT DETECTED Final    Comment: (NOTE) The GeneXpert MRSA Assay (FDA approved for NASAL specimens only), is one component of a comprehensive MRSA colonization surveillance program. It is not intended to diagnose MRSA infection nor to guide or monitor treatment for MRSA infections. Test performance is not FDA approved in patients less than 20 years old. Performed at [facility], [place] [address] [place],  [address]   Culture, blood (routine x 2)     Status: None (Preliminary result)   Collection Time: 07/03/21 12:53 PM  Specimen: BLOOD RIGHT HAND  Result Value Ref Range Status   Specimen Description BLOOD RIGHT HAND  Final   Special Requests   Final    BOTTLES DRAWN AEROBIC AND ANAEROBIC Blood Culture adequate volume   Culture   Final    NO GROWTH 4 DAYS Performed at [facility], [address], [place], [place] [address]    Report Status PENDING  Incomplete  Culture, blood (routine x 2)     Status: None (Preliminary result)   Collection Time: 07/03/21  1:08 PM   Specimen: BLOOD LEFT HAND  Result Value Ref Range Status   Specimen Description BLOOD LEFT HAND  Final   Special Requests   Final    BOTTLES DRAWN AEROBIC AND ANAEROBIC Blood Culture adequate volume   Culture   Final    NO GROWTH 4 DAYS Performed at [facility], [place] [address] [place], [place] [address]    Report Status PENDING  Incomplete      Studies: DG Swallowing Func-Speech Pathology  Result Date: 07/06/2021 Table formatting from the original result was not included. Objective Swallowing Evaluation: Type of Study: MBS-Modified Barium Swallow Study  Patient Details Name: TYKEVIOUS KENDERDINE MRN: [id number] Date of Birth: 1961-01-17 Today's Date: 07/06/2021 Time: SLP Start Time (ACUTE ONLY):  1435 -SLP Stop Time (ACUTE ONLY): 1449 SLP Time Calculation (min) (ACUTE ONLY): 14 min Past Medical History: No past medical history on file. Past Surgical History: Past Surgical History: Procedure Laterality Date  ANTERIOR CERVICAL DECOMPRESSION/DISCECTOMY FUSION 4 LEVELS N/A 06/29/2021  Procedure: Anterior Cervical Discectomy Fusion - Cervical Three-Cervical Four - Cervical Four- Cervical Five - Cervical Five- Cervical Six - Cervical Six- Cervical Seven;  Surgeon: Earnie Larsson, MD;  Location: [facility];  Service: Neurosurgery;  Laterality: N/A;  HEMATOMA EVACUATION N/A 07/03/2021  Procedure: EXPLORATION OF NECK AND REMOVE BLOOD CLOT;  Surgeon: Earnie Larsson, MD;  Location: [place] [facility] [place];  Service: Neurosurgery;  Laterality: N/A; HPI: 61 y/o admitted 06/29/21 following C3-7 ACDF after progressive cervical myelopathy symptoms with subsequent fall and severe incomplete SCI. No significant PMH.Repeat MBS for possible advancement.  No data recorded  Recommendations for follow up therapy are one component of a multi-disciplinary discharge planning process, led by the attending physician.  Recommendations may be updated based on patient status, additional functional criteria and insurance authorization. Assessment / Plan / Recommendation Clinical Impressions 07/06/2021 Clinical Impression Pt's pharyngeal swallow function has improved from prior study with significantly reduced pharyngeal edema. There was no aspiration but laryngeal penetration with cup and straw sip thin (PAS 3) due to decreased laryngeal closure. Intentional coughs were unsuccessful to clear and cervical collar prevented chin tuck. Nectar thick increased coordination of overall swallow did not enter airway. Residual in valleculae and pyriform sinuses was minimal. Oral mastication and manipulation were mildly delayed. Pt may initiate Dys 2, nectar thick liquids, pills crushed in puree and assist with feeding. ST will follow. SLP Visit Diagnosis Dysphagia, pharyngeal phase  (R13.13) Attention and concentration deficit following -- Frontal lobe and executive function deficit following -- Impact on safety and function --   Treatment Recommendations 07/06/2021 Treatment Recommendations Therapy as outlined in treatment plan below   Prognosis 07/06/2021 Prognosis for Safe Diet Advancement Good Barriers to Reach Goals -- Barriers/Prognosis Comment -- Diet Recommendations 07/06/2021 SLP Diet Recommendations Dysphagia 2 (Fine chop) solids;Nectar thick liquid Liquid Administration via Cup;Straw Medication Administration Crushed with puree Compensations Slow rate;Small sips/bites;Minimize environmental distractions;Clear throat intermittently Postural Changes Seated upright at 90 degrees  Other Recommendations 07/06/2021 Recommended Consults -- Oral Care Recommendations Oral care BID Other Recommendations -- Follow Up Recommendations Acute inpatient rehab (3hours/day) Assistance recommended at discharge -- Functional Status Assessment Patient has had a recent decline in their functional status and demonstrates the ability to make significant improvements in function in a reasonable and predictable amount of time. Frequency and Duration  07/06/2021 Speech Therapy Frequency (ACUTE ONLY) min 2x/week Treatment Duration 2 weeks   Oral Phase 07/06/2021 Oral Phase Impaired Oral - Pudding Teaspoon -- Oral - Pudding Cup -- Oral - Honey Teaspoon -- Oral - Honey Cup -- Oral - Nectar Teaspoon WFL Oral - Nectar Cup WFL Oral - Nectar Straw -- Oral - Thin Teaspoon WFL Oral - Thin Cup WFL Oral - Thin Straw WFL Oral - Puree -- Oral - Mech Soft Delayed oral transit Oral - Regular -- Oral - Multi-Consistency -- Oral - Pill -- Oral Phase - Comment --  Pharyngeal Phase 07/06/2021 Pharyngeal Phase Impaired Pharyngeal- Pudding Teaspoon -- Pharyngeal -- Pharyngeal- Pudding Cup -- Pharyngeal -- Pharyngeal- Honey Teaspoon -- Pharyngeal -- Pharyngeal- Honey Cup -- Pharyngeal -- Pharyngeal- Nectar Teaspoon Pharyngeal residue -  valleculae Pharyngeal Material does not enter airway Pharyngeal- Nectar Cup Pharyngeal residue - pyriform Pharyngeal Material does not enter airway Pharyngeal- Nectar Straw NT Pharyngeal -- Pharyngeal- Thin Teaspoon Pharyngeal residue - valleculae;Pharyngeal residue - pyriform Pharyngeal Material does not enter airway Pharyngeal- Thin Cup Pharyngeal residue - valleculae;Pharyngeal residue - pyriform;Penetration/Aspiration during swallow Pharyngeal Material enters airway, remains ABOVE vocal cords and not ejected out Pharyngeal- Thin Straw Penetration/Aspiration during swallow;Pharyngeal residue - valleculae;Reduced airway/laryngeal closure Pharyngeal Material enters airway, remains ABOVE vocal cords and not ejected out Pharyngeal- Puree NT Pharyngeal -- Pharyngeal- Mechanical Soft WFL Pharyngeal Material does not enter airway Pharyngeal- Regular NT Pharyngeal -- Pharyngeal- Multi-consistency NT Pharyngeal -- Pharyngeal- Pill NT Pharyngeal -- Pharyngeal Comment --  Cervical Esophageal Phase  07/06/2021 Cervical Esophageal Phase WFL Pudding Teaspoon -- Pudding Cup -- Honey Teaspoon -- Honey Cup -- Nectar Teaspoon -- Nectar Cup -- Nectar Straw -- Thin Teaspoon -- Thin Cup -- Thin Straw -- Puree -- Mechanical Soft -- Regular -- Multi-consistency -- Pill -- Cervical Esophageal Comment -- [place] Siren 07/06/2021, 4:16 PM                      Scheduled Meds:  folic acid  1 mg Oral Daily   LORazepam  0-4 mg Oral Q4H   Followed by   Derrill Memo ON 07/09/2021] LORazepam  0-4 mg Oral Q8H   mouth rinse  15 mL Mouth Rinse q12n4p   multivitamin with minerals  1 tablet Oral Daily   potassium chloride  40 mEq Oral BID   senna-docusate  1 tablet Per Tube BID   sodium chloride flush  3 mL Intravenous Q12H   tamsulosin  0.4 mg Oral Daily   thiamine  100 mg Oral Daily   Or   thiamine  100 mg Intravenous Daily    Continuous Infusions:  sodium chloride 250 mL (06/29/21 1316)     LOS: 8 days     Kayleen Memos,  MD [facility] Pager [phone]  If 7PM-7AM, please contact night-coverage [email or web address] Password [facility] 07/07/2021, 12:30 PM

## 2021-07-07 NOTE — Progress Notes (Signed)
Episode of sustained and elevated HR in the 150's. Neurosurgery notified.  Awaiting call back from MD. Patient remains restless, agitated and anxious. MD notified also.

## 2021-07-07 NOTE — Progress Notes (Signed)
Patient unable to void. Abdomen distended. Bladder scan performed, and a total of 570 ml of urine scanned. Straight Un and Out Cath performed and a total of 750 ml of urine drained. Post cath indicated 0 ml of urine.

## 2021-07-07 NOTE — Progress Notes (Signed)
RT NOTES: ABG obtained and sent to lab. Lab tech Lisa notified.  

## 2021-07-07 NOTE — Progress Notes (Signed)
Received a page from bedside RN regarding the patient being restless with acute altered mental status.  This morning, at breakfast, the patient was calm and following directions from RT while using his flutter valve.  He was pleasant and lucid on my assessment this morning.    CT head done today, ordered by the primary team for the same concern, was non acute.  Returned to bedside, patient is alert and writhing in his bed, not following commands which is a definite change from this AM.  Stat MRI brain ordered to rule out an ischemic event.  No evidence of intracranial bleed on today's CT head.

## 2021-07-07 NOTE — Progress Notes (Signed)
Speech Language Pathology Treatment: Dysphagia  Patient Details Name: Brian [name] MRN: [id number] DOB: Sep 11, 1960 Today's Date: 07/07/2021 Time: 8182-9937 SLP Time Calculation (min) (ACUTE ONLY): 9 min  Assessment / Plan / Recommendation Clinical Impression  Therapist following up after MBS yesterday and diet initiated. Therapist read pt having tremors today and SLP encountered pt with eyes open, hand near cervical collar and shaking. He stopped once touched and given verbal cues but eyes not focused, trembling, speech dysarthric. Pt repositioned and retrieved RN. Last night pt became agitated, attempting to get OOB and neurosurgery suspicious of withdrawal, started CIWA protocol and given Ativan. He is not appropriate for po's until cognitively he returns to baseline. ST will plan to follow up on Monday.    HPI HPI: 61 y/o admitted 06/29/21 following C3-7 ACDF after progressive cervical myelopathy symptoms with subsequent fall and severe incomplete SCI. No significant PMH.Repeat MBS for possible advancement.      SLP Plan  Continue with current plan of care      Recommendations for follow up therapy are one component of a multi-disciplinary discharge planning process, led by the attending physician.  Recommendations may be updated based on patient status, additional functional criteria and insurance authorization.    Recommendations  Diet recommendations:  (only when alert and appropriate for D2.NTL otherwise do not allow po's) Medication Administration: Via alternative means                Oral Care Recommendations: Oral care BID Follow Up Recommendations: Acute inpatient rehab (3hours/day) SLP Visit Diagnosis: Dysphagia, pharyngeal phase (R13.13) Plan: Continue with current plan of care           [name]  07/07/2021, 2:14 PM

## 2021-07-07 NOTE — Progress Notes (Addendum)
MRI brain non acute.  Patient has had staring spells and due to concern for seizure activity stat EEG was ordered and stat IV ativan 2 mg was also ordered to be administered at bedside.  Discussed with neurology.  Dr. Amada Jupiter will see the patient in consultation.  ABG obtained 7.469/32.4/74.9 on RA.    Last dose of Cefepime was on 07/04/21.  Hold off Flexeril for now.  Delirium and seizure precautions in place.  We will continue to closely monitor and treat as indicated.

## 2021-07-07 NOTE — Progress Notes (Signed)
Providing Compassionate, Quality Care - Together   Subjective: Patient is very restless and agitated during today's assessment. He has a noticeable tremor. Nursing staff report having to in and out cath due to urinary retention.  Objective: Vital signs in last 24 hours: Temp:  [98.2 F (36.8 C)-98.5 F (36.9 C)] 98.5 F (36.9 C) (02/11 0813) Pulse Rate:  [87-99] 99 (02/11 0813) Resp:  [16-20] 16 (02/11 0813) BP: (132-142)/(89-97) 137/89 (02/11 0813) SpO2:  [98 %-100 %] 100 % (02/11 0813) Weight:  [59.3 kg] 59.3 kg (02/11 0500)  Intake/Output from previous day: 02/10 0701 - 02/11 0700 In: 3 [I.V.:3] Out: 1550 [Urine:1550] Intake/Output this shift: No intake/output data recorded.    Alert and oriented MAE, Strength 4+/5, slightly weaker on the right Decreased fine motor Neck soft Incision is clean, dry, and intact Soft collar in place   Lab Results: Recent Labs    07/06/21 0346 07/07/21 0312  WBC 15.3* 18.0*  HGB 9.8* 10.7*  HCT 28.7* 31.6*  PLT 340 427*   BMET Recent Labs    07/06/21 0346 07/07/21 0312  NA 134* 135  K 3.8 3.3*  CL 100 100  CO2 24 24  GLUCOSE 121* 110*  BUN 21* 19  CREATININE 0.67 0.79  CALCIUM 9.5 9.7    Studies/Results: DG Swallowing Func-Speech Pathology  Result Date: 07/06/2021 Table formatting from the original result was not included. Objective Swallowing Evaluation: Type of Study: MBS-Modified Barium Swallow Study  Patient Details Name: Brian [name] MRN: [id number] Date of Birth: [birth year] Today's Date: 07/06/2021 Time: SLP Start Time (ACUTE ONLY): 1435 -SLP Stop Time (ACUTE ONLY): 1449 SLP Time Calculation (min) (ACUTE ONLY): 14 min Past Medical History: No past medical history on file. Past Surgical History: Past Surgical History: Procedure Laterality Date  ANTERIOR CERVICAL DECOMPRESSION/DISCECTOMY FUSION 4 LEVELS N/A 06/29/2021  Procedure: Anterior Cervical Discectomy Fusion - Cervical Three-Cervical Four - Cervical Four-  Cervical Five - Cervical Five- Cervical Six - Cervical Six- Cervical Seven;  Surgeon: Julio Sicks, MD;  Location: [facility] OR;  Service: Neurosurgery;  Laterality: N/A;  HEMATOMA EVACUATION N/A 07/03/2021  Procedure: EXPLORATION OF NECK AND REMOVE BLOOD CLOT;  Surgeon: Julio Sicks, MD;  Location: [facility] OR;  Service: Neurosurgery;  Laterality: N/A; HPI: 61 y/o admitted 06/29/21 following C3-7 ACDF after progressive cervical myelopathy symptoms with subsequent fall and severe incomplete SCI. No significant PMH.Repeat MBS for possible advancement.  No data recorded  Recommendations for follow up therapy are one component of a multi-disciplinary discharge planning process, led by the attending physician.  Recommendations may be updated based on patient status, additional functional criteria and insurance authorization. Assessment / Plan / Recommendation Clinical Impressions 07/06/2021 Clinical Impression Pt's pharyngeal swallow function has improved from prior study with significantly reduced pharyngeal edema. There was no aspiration but laryngeal penetration with cup and straw sip thin (PAS 3) due to decreased laryngeal closure. Intentional coughs were unsuccessful to clear and cervical collar prevented chin tuck. Nectar thick increased coordination of overall swallow did not enter airway. Residual in valleculae and pyriform sinuses was minimal. Oral mastication and manipulation were mildly delayed. Pt may initiate Dys 2, nectar thick liquids, pills crushed in puree and assist with feeding. ST will follow. SLP Visit Diagnosis Dysphagia, pharyngeal phase (R13.13) Attention and concentration deficit following -- Frontal lobe and executive function deficit following -- Impact on safety and function --   Treatment Recommendations 07/06/2021 Treatment Recommendations Therapy as outlined in treatment plan below   Prognosis 07/06/2021 Prognosis for Safe  Diet Advancement Good Barriers to Reach Goals -- Barriers/Prognosis Comment -- Diet  Recommendations 07/06/2021 SLP Diet Recommendations Dysphagia 2 (Fine chop) solids;Nectar thick liquid Liquid Administration via Cup;Straw Medication Administration Crushed with puree Compensations Slow rate;Small sips/bites;Minimize environmental distractions;Clear throat intermittently Postural Changes Seated upright at 90 degrees   Other Recommendations 07/06/2021 Recommended Consults -- Oral Care Recommendations Oral care BID Other Recommendations -- Follow Up Recommendations Acute inpatient rehab (3hours/day) Assistance recommended at discharge -- Functional Status Assessment Patient has had a recent decline in their functional status and demonstrates the ability to make significant improvements in function in a reasonable and predictable amount of time. Frequency and Duration  07/06/2021 Speech Therapy Frequency (ACUTE ONLY) min 2x/week Treatment Duration 2 weeks   Oral Phase 07/06/2021 Oral Phase Impaired Oral - Pudding Teaspoon -- Oral - Pudding Cup -- Oral - Honey Teaspoon -- Oral - Honey Cup -- Oral - Nectar Teaspoon WFL Oral - Nectar Cup WFL Oral - Nectar Straw -- Oral - Thin Teaspoon WFL Oral - Thin Cup WFL Oral - Thin Straw WFL Oral - Puree -- Oral - Mech Soft Delayed oral transit Oral - Regular -- Oral - Multi-Consistency -- Oral - Pill -- Oral Phase - Comment --  Pharyngeal Phase 07/06/2021 Pharyngeal Phase Impaired Pharyngeal- Pudding Teaspoon -- Pharyngeal -- Pharyngeal- Pudding Cup -- Pharyngeal -- Pharyngeal- Honey Teaspoon -- Pharyngeal -- Pharyngeal- Honey Cup -- Pharyngeal -- Pharyngeal- Nectar Teaspoon Pharyngeal residue - valleculae Pharyngeal Material does not enter airway Pharyngeal- Nectar Cup Pharyngeal residue - pyriform Pharyngeal Material does not enter airway Pharyngeal- Nectar Straw NT Pharyngeal -- Pharyngeal- Thin Teaspoon Pharyngeal residue - valleculae;Pharyngeal residue - pyriform Pharyngeal Material does not enter airway Pharyngeal- Thin Cup Pharyngeal residue -  valleculae;Pharyngeal residue - pyriform;Penetration/Aspiration during swallow Pharyngeal Material enters airway, remains ABOVE vocal cords and not ejected out Pharyngeal- Thin Straw Penetration/Aspiration during swallow;Pharyngeal residue - valleculae;Reduced airway/laryngeal closure Pharyngeal Material enters airway, remains ABOVE vocal cords and not ejected out Pharyngeal- Puree NT Pharyngeal -- Pharyngeal- Mechanical Soft WFL Pharyngeal Material does not enter airway Pharyngeal- Regular NT Pharyngeal -- Pharyngeal- Multi-consistency NT Pharyngeal -- Pharyngeal- Pill NT Pharyngeal -- Pharyngeal Comment --  Cervical Esophageal Phase  07/06/2021 Cervical Esophageal Phase WFL Pudding Teaspoon -- Pudding Cup -- Honey Teaspoon -- Honey Cup -- Nectar Teaspoon -- Nectar Cup -- Nectar Straw -- Thin Teaspoon -- Thin Cup -- Thin Straw -- Puree -- Mechanical Soft -- Regular -- Multi-consistency -- Pill -- Cervical Esophageal Comment -- Brian [name] 07/06/2021, 4:16 PM                      Assessment/Plan: Patient status post C3-4, C4-5, C5-6, C6-7 anterior cervical discectomy with interbody fusion by Dr. Jordan Likes on 06/29/2021. Increased difficulty swallowing with lung atelectasis and elevated temperatures on 07/02/2021. Made NPO following MBS by SLP. 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 CTA head and neck negative for stroke. MRI revealed epidural hematoma. Patient underwent exploration of his cervical fusion with evacuation of the epidural hematoma on 07/03/2021. Cortrak was placed on 07/04/2021. Patient's strength much improved since epidural hematoma evacuation. Cortrak removed yesterday and dysphagia 2 diet with nectar thick liquids started. Patient with increasing agitation over the last two days. Discontinued steroids 07/06/2021. Started patient on CIWA protocol as he appears to be showing signs of withdrawal.   LOS: 8 days   -Continue to bladder scan. Will add  Flomax -CIWA protocol ordered.  Val Eagle, DNP, AGNP-C Nurse Practitioner  [facility] [address], [place], [place] [address] P: [phone]     F: [phone]  07/07/2021, 10:16 AM

## 2021-07-07 NOTE — Progress Notes (Signed)
Subjective: Patient with staring spells this evening concerning for seizure activity. STAT EEG was ordered and Ativan 2 mg IV was ordered. The patient's last dose of cefepime was on 2/8. Flexeril has been held.   Objective: Current vital signs: BP (!) 141/106 (BP Location: Left Arm)    Pulse 97    Temp 98 F (36.7 C) (Oral)    Resp 18    Ht 5\' 10"  (1.778 m)    Wt 59.3 kg    SpO2 94%    BMI 18.76 kg/m  Vital signs in last 24 hours: Temp:  [97.9 F (36.6 C)-98.5 F (36.9 C)] 98 F (36.7 C) (02/11 1440) Pulse Rate:  [78-99] 97 (02/11 1440) Resp:  [16-20] 18 (02/11 1440) BP: (126-141)/(89-106) 141/106 (02/11 1440) SpO2:  [94 %-100 %] 94 % (02/11 1440) Weight:  [59.3 kg] 59.3 kg (02/11 0500)  Intake/Output from previous day: 02/10 0701 - 02/11 0700 In: 3 [I.V.:3] Out: 1550 [Urine:1550] Intake/Output this shift: No intake/output data recorded. Nutritional status:  Diet Order             Diet NPO time specified  Diet effective now                  HEENT: EEG leads are being placed Lungs: Respirations unlabored Ext: No edema  Neurologic Exam: Ment: Awake with eyes open but does not make eye contact or attend to visual stimuli. Made a single-word exclamation to EEG tech while EEG leads were being placed, otherwise with no comprehensible speech in the context of occasional murmuring. Unable to follow commands but is able to engage in semi-purposeful movement when swatting at EEG tech's hands during EEG lead placement CN: PERRL. Eyes open with gaze near the midline. Does not fixate or track. No nystagmus. Face symmetric.  Motor: Swats with BUE equally. Otherwise no purposeful upper extremity movement. Mildly spastic BUE tone.  Moderately to severely spastic BLE tone. Withdraws BLE about 5 cm to noxious plantar stimulation.  Sensory: As above.  Reflexes: Hyperactive x 4 Cerebellar/Gait: Unable to assess  Lab Results: Results for orders placed or performed during the hospital  encounter of 06/29/21 (from the past 48 hour(s))  Glucose, capillary     Status: Abnormal   Collection Time: 07/05/21  7:51 PM  Result Value Ref Range   Glucose-Capillary 158 (H) 70 - 99 mg/dL    Comment: Glucose reference range applies only to samples taken after fasting for at least 8 hours.  Glucose, capillary     Status: Abnormal   Collection Time: 07/05/21 11:15 PM  Result Value Ref Range   Glucose-Capillary 149 (H) 70 - 99 mg/dL    Comment: Glucose reference range applies only to samples taken after fasting for at least 8 hours.  Basic metabolic panel     Status: Abnormal   Collection Time: 07/06/21  3:46 AM  Result Value Ref Range   Sodium 134 (L) 135 - 145 mmol/L   Potassium 3.8 3.5 - 5.1 mmol/L   Chloride 100 98 - 111 mmol/L   CO2 24 22 - 32 mmol/L   Glucose, Bld 121 (H) 70 - 99 mg/dL    Comment: Glucose reference range applies only to samples taken after fasting for at least 8 hours.   BUN 21 (H) 6 - 20 mg/dL   Creatinine, Ser 09/03/21 0.61 - 1.24 mg/dL   Calcium 9.5 8.9 - 6.06 mg/dL   GFR, Estimated 30.1 >60 mL/min    Comment: (NOTE) Calculated using the  CKD-EPI Creatinine Equation (2021)    Anion gap 10 5 - 15    Comment: Performed at [facility], [address] [place], [place] [address]  CBC     Status: Abnormal   Collection Time: 07/06/21  3:46 AM  Result Value Ref Range   WBC 15.3 (H) 4.0 - 10.5 K/uL   RBC 2.97 (L) 4.22 - 5.81 MIL/uL   Hemoglobin 9.8 (L) 13.0 - 17.0 g/dL   HCT 11.1 (L) 55.2 - 08.0 %   MCV 96.6 80.0 - 100.0 fL   MCH 33.0 26.0 - 34.0 pg   MCHC 34.1 30.0 - 36.0 g/dL   RDW 22.3 36.1 - 22.4 %   Platelets 340 150 - 400 K/uL   nRBC 0.0 0.0 - 0.2 %    Comment: Performed at [facility], [address] [place], [place] [address]  Glucose, capillary     Status: Abnormal   Collection Time: 07/06/21  3:52 AM  Result Value Ref Range   Glucose-Capillary 127 (H) 70 - 99 mg/dL    Comment: Glucose reference range applies only to samples taken  after fasting for at least 8 hours.  Glucose, capillary     Status: Abnormal   Collection Time: 07/06/21  8:02 AM  Result Value Ref Range   Glucose-Capillary 202 (H) 70 - 99 mg/dL    Comment: Glucose reference range applies only to samples taken after fasting for at least 8 hours.  Glucose, capillary     Status: Abnormal   Collection Time: 07/06/21 11:37 AM  Result Value Ref Range   Glucose-Capillary 139 (H) 70 - 99 mg/dL    Comment: Glucose reference range applies only to samples taken after fasting for at least 8 hours.  Glucose, capillary     Status: Abnormal   Collection Time: 07/06/21  3:48 PM  Result Value Ref Range   Glucose-Capillary 104 (H) 70 - 99 mg/dL    Comment: Glucose reference range applies only to samples taken after fasting for at least 8 hours.  Glucose, capillary     Status: Abnormal   Collection Time: 07/06/21  8:11 PM  Result Value Ref Range   Glucose-Capillary 108 (H) 70 - 99 mg/dL    Comment: Glucose reference range applies only to samples taken after fasting for at least 8 hours.  Glucose, capillary     Status: Abnormal   Collection Time: 07/06/21 11:15 PM  Result Value Ref Range   Glucose-Capillary 103 (H) 70 - 99 mg/dL    Comment: Glucose reference range applies only to samples taken after fasting for at least 8 hours.  Glucose, capillary     Status: Abnormal   Collection Time: 07/07/21  3:08 AM  Result Value Ref Range   Glucose-Capillary 120 (H) 70 - 99 mg/dL    Comment: Glucose reference range applies only to samples taken after fasting for at least 8 hours.  Basic metabolic panel     Status: Abnormal   Collection Time: 07/07/21  3:12 AM  Result Value Ref Range   Sodium 135 135 - 145 mmol/L   Potassium 3.3 (L) 3.5 - 5.1 mmol/L   Chloride 100 98 - 111 mmol/L   CO2 24 22 - 32 mmol/L   Glucose, Bld 110 (H) 70 - 99 mg/dL    Comment: Glucose reference range applies only to samples taken after fasting for at least 8 hours.   BUN 19 6 - 20 mg/dL    Creatinine, Ser 0.05  0.61 - 1.24 mg/dL   Calcium 9.7 8.9 - 96.0 mg/dL   GFR, Estimated >45 >40 mL/min    Comment: (NOTE) Calculated using the CKD-EPI Creatinine Equation (2021)    Anion gap 11 5 - 15    Comment: Performed at [facility], [address], [place], [place] [address]  CBC     Status: Abnormal   Collection Time: 07/07/21  3:12 AM  Result Value Ref Range   WBC 18.0 (H) 4.0 - 10.5 K/uL   RBC 3.25 (L) 4.22 - 5.81 MIL/uL   Hemoglobin 10.7 (L) 13.0 - 17.0 g/dL   HCT 14.7 (L) 82.9 - 56.2 %   MCV 97.2 80.0 - 100.0 fL   MCH 32.9 26.0 - 34.0 pg   MCHC 33.9 30.0 - 36.0 g/dL   RDW 13.0 86.5 - 78.4 %   Platelets 427 (H) 150 - 400 K/uL   nRBC 0.8 (H) 0.0 - 0.2 %    Comment: Performed at [facility], [address], [place], [place] [address]  Procalcitonin - Baseline     Status: None   Collection Time: 07/07/21  7:20 AM  Result Value Ref Range   Procalcitonin 0.27 ng/mL    Comment:        Interpretation: PCT (Procalcitonin) <= 0.5 ng/mL: Systemic infection (sepsis) is not likely. Local bacterial infection is possible. (NOTE)       Sepsis PCT Algorithm           Lower Respiratory Tract                                      Infection PCT Algorithm    ----------------------------     ----------------------------         PCT < 0.25 ng/mL                PCT < 0.10 ng/mL          Strongly encourage             Strongly discourage   discontinuation of antibiotics    initiation of antibiotics    ----------------------------     -----------------------------       PCT 0.25 - 0.50 ng/mL            PCT 0.10 - 0.25 ng/mL               OR       >80% decrease in PCT            Discourage initiation of                                            antibiotics      Encourage discontinuation           of antibiotics    ----------------------------     -----------------------------         PCT >= 0.50 ng/mL              PCT 0.26 - 0.50 ng/mL               AND        <80%  decrease in PCT             Encourage initiation of  antibiotics       Encourage continuation           of antibiotics    ----------------------------     -----------------------------        PCT >= 0.50 ng/mL                  PCT > 0.50 ng/mL               AND         increase in PCT                  Strongly encourage                                      initiation of antibiotics    Strongly encourage escalation           of antibiotics                                     -----------------------------                                           PCT <= 0.25 ng/mL                                                 OR                                        > 80% decrease in PCT                                      Discontinue / Do not initiate                                             antibiotics  Performed at [facility], [address] [place], [place] [address]   Lactic acid, plasma     Status: Abnormal   Collection Time: 07/07/21  7:20 AM  Result Value Ref Range   Lactic Acid, Venous 2.2 (HH) 0.5 - 1.9 mmol/L    Comment: CRITICAL RESULT CALLED TO, READ BACK BY AND VERIFIED WITH: M.FRYE RN @ [phone] 07/07/2021 BY C.EDENS Performed at [facility], [address], [place], [place] [address]   Glucose, capillary     Status: None   Collection Time: 07/07/21  8:09 AM  Result Value Ref Range   Glucose-Capillary 89 70 - 99 mg/dL    Comment: Glucose reference range applies only to samples taken after fasting for at least 8 hours.  Blood gas, arterial     Status: Abnormal   Collection Time: 07/07/21  5:35 PM  Result Value Ref Range   FIO2 21.00    pH, Arterial 7.469 (H) 7.350 - 7.450  pCO2 arterial 32.4 32.0 - 48.0 mmHg   pO2, Arterial 74.9 (L) 83.0 - 108.0 mmHg   Bicarbonate 23.4 20.0 - 28.0 mmol/L   Acid-Base Excess 0.0 0.0 - 2.0 mmol/L   O2 Saturation 95.6 %   Patient temperature 36.6    Collection site RIGHT RADIAL     Drawn by [id number]     Comment: DRAWN BY RN   Sample type ARTERIAL DRAW    Allens test (pass/fail) PASS PASS    Comment: Performed at [facility], [address], [place], [place] [address]    Recent Results (from the past 240 hour(s))  SARS Coronavirus 2 by RT PCR (hospital order, performed in [facility] hospital lab) Nasopharyngeal Nasopharyngeal Swab     Status: None   Collection Time: 06/29/21  6:00 AM   Specimen: Nasopharyngeal Swab  Result Value Ref Range Status   SARS Coronavirus 2 NEGATIVE NEGATIVE Final    Comment: (NOTE) SARS-CoV-2 target nucleic acids are NOT DETECTED.  The SARS-CoV-2 RNA is generally detectable in upper and lower respiratory specimens during the acute phase of infection. The lowest concentration of SARS-CoV-2 viral copies this assay can detect is 250 copies / mL. A negative result does not preclude SARS-CoV-2 infection and should not be used as the sole basis for treatment or other patient management decisions.  A negative result may occur with improper specimen collection / handling, submission of specimen other than nasopharyngeal swab, presence of viral mutation(s) within the areas targeted by this assay, and inadequate number of viral copies (<250 copies / mL). A negative result must be combined with clinical observations, patient history, and epidemiological information.  Fact Sheet for Patients:   BoilerBrush.com.cy  Fact Sheet for Healthcare Providers: https://pope.com/  This test is not yet approved or  cleared by the [place] FDA and has been authorized for detection and/or diagnosis of SARS-CoV-2 by FDA under an Emergency Use Authorization (EUA).  This EUA will remain in effect (meaning this test can be used) for the duration of the COVID-19 declaration under Section 564(b)(1) of the Act, 21 U.S.C. section 360bbb-3(b)(1), unless the authorization is terminated or revoked  sooner.  Performed at [facility], [address] [place], [place] [address]   Surgical PCR Screen     Status: None   Collection Time: 06/29/21  6:16 AM   Specimen: Nasal Mucosa; Nasal Swab  Result Value Ref Range Status   MRSA, PCR NEGATIVE NEGATIVE Final   Staphylococcus aureus NEGATIVE NEGATIVE Final    Comment: (NOTE) The Xpert SA Assay (FDA approved for NASAL specimens in patients 72 years of age and older), is one component of a comprehensive surveillance program. It is not intended to diagnose infection nor to guide or monitor treatment. Performed at [facility], [address], [place], [place] [address]   Urine Culture     Status: None   Collection Time: 07/03/21 11:10 AM   Specimen: Urine, Catheterized  Result Value Ref Range Status   Specimen Description URINE, CATHETERIZED  Final   Special Requests NONE  Final   Culture   Final    NO GROWTH Performed at [facility], [address], [place], [place] [address]    Report Status 07/04/2021 FINAL  Final  MRSA Next Gen by PCR, Nasal     Status: None   Collection Time: 07/03/21 12:29 PM   Specimen: Nasal Mucosa; Nasal Swab  Result Value Ref Range Status   MRSA by  PCR Next Gen NOT DETECTED NOT DETECTED Final    Comment: (NOTE) The GeneXpert MRSA Assay (FDA approved for NASAL specimens only), is one component of a comprehensive MRSA colonization surveillance program. It is not intended to diagnose MRSA infection nor to guide or monitor treatment for MRSA infections. Test performance is not FDA approved in patients less than 63 years old. Performed at [facility], [address], [place], [place] [address]   Culture, blood (routine x 2)     Status: None (Preliminary result)   Collection Time: 07/03/21 12:53 PM   Specimen: BLOOD RIGHT HAND  Result Value Ref Range Status   Specimen Description BLOOD RIGHT HAND  Final   Special Requests   Final    BOTTLES DRAWN AEROBIC AND ANAEROBIC Blood  Culture adequate volume   Culture   Final    NO GROWTH 4 DAYS Performed at [facility], [address], [place], [place] [address]    Report Status PENDING  Incomplete  Culture, blood (routine x 2)     Status: None (Preliminary result)   Collection Time: 07/03/21  1:08 PM   Specimen: BLOOD LEFT HAND  Result Value Ref Range Status   Specimen Description BLOOD LEFT HAND  Final   Special Requests   Final    BOTTLES DRAWN AEROBIC AND ANAEROBIC Blood Culture adequate volume   Culture   Final    NO GROWTH 4 DAYS Performed at [facility], [address] [place], [place] [address]    Report Status PENDING  Incomplete    Lipid Panel No results for input(s): CHOL, TRIG, HDL, CHOLHDL, VLDL, LDLCALC in the last 72 hours.  Studies/Results: CT HEAD WO CONTRAST ( )  Result Date: 07/07/2021 CLINICAL DATA:  Altered mental status EXAM: CT HEAD WITHOUT CONTRAST TECHNIQUE: Contiguous axial images were obtained from the base of the skull through the vertex without intravenous contrast. RADIATION DOSE REDUCTION: This exam was performed according to the departmental dose-optimization program which includes automated exposure control, adjustment of the mA and/or kV according to patient size and/or use of iterative reconstruction technique. COMPARISON:  07/03/2021 FINDINGS: Brain: No evidence of acute infarction, hemorrhage, hydrocephalus, extra-axial collection or mass lesion/mass effect. Vascular: No hyperdense vessel or unexpected calcification. Skull: Normal. Negative for fracture or focal lesion. 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 Paranasal sinuses and mastoid air cells otherwise clear. Other: None. IMPRESSION: No acute intracranial abnormality. Electronically Signed   By: Duanne Guess D.O.   On: 07/07/2021 13:56   MR BRAIN WO CONTRAST  Result Date: 07/07/2021 CLINICAL DATA:  Altered mental status. EXAM: MRI HEAD WITHOUT CONTRAST TECHNIQUE: Multiplanar,  multiecho pulse sequences of the brain and surrounding structures were obtained without intravenous contrast. COMPARISON:  Head CT 07/07/2021 and MRI 06/19/2021 FINDINGS: Brain: There is no evidence of an acute infarct, intracranial hemorrhage, mass, midline shift, or extra-axial fluid collection. A punctate focus of trace diffusion weighted signal hyperintensity without reduced ADC in the left occipital lobe is unchanged. The ventricles and sulci are within normal limits for age. Scattered punctate T2 hyperintensities in the cerebral white matter bilaterally are unchanged and nonspecific but compatible with minimal chronic small vessel ischemic disease. Vascular: Major intracranial vascular flow voids are preserved. Skull and upper cervical spine: Unremarkable bone marrow signal para Sinuses/Orbits: Unremarkable orbits. Paranasal sinuses and mastoid air cells are clear. Other: None. IMPRESSION: 1. No acute intracranial abnormality. 2. Minimal chronic small vessel ischemic disease. Electronically Signed   By: Freida Busman  Mosetta PuttGrady M.D.   On: 07/07/2021 16:30   DG Swallowing Func-Speech Pathology  Result Date: 07/06/2021 Table formatting from the original result was not included. Objective Swallowing Evaluation: Type of Study: MBS-Modified Barium Swallow Study  Patient Details Name: Brian [name] MRN: [id number] Date of Birth: [birth year] Today's Date: 07/06/2021 Time: SLP Start Time (ACUTE ONLY): 1435 -SLP Stop Time (ACUTE ONLY): 1449 SLP Time Calculation (min) (ACUTE ONLY): 14 min Past Medical History: No past medical history on file. Past Surgical History: Past Surgical History: Procedure Laterality Date  ANTERIOR CERVICAL DECOMPRESSION/DISCECTOMY FUSION 4 LEVELS N/A 06/29/2021  Procedure: Anterior Cervical Discectomy Fusion - Cervical Three-Cervical Four - Cervical Four- Cervical Five - Cervical Five- Cervical Six - Cervical Six- Cervical Seven;  Surgeon: Julio SicksPool, Henry, MD;  Location: [facility] OR;  Service: Neurosurgery;   Laterality: N/A;  HEMATOMA EVACUATION N/A 07/03/2021  Procedure: EXPLORATION OF NECK AND REMOVE BLOOD CLOT;  Surgeon: Julio SicksPool, Henry, MD;  Location: [facility] OR;  Service: Neurosurgery;  Laterality: N/A; HPI: 61 y/o admitted 06/29/21 following C3-7 ACDF after progressive cervical myelopathy symptoms with subsequent fall and severe incomplete SCI. No significant PMH.Repeat MBS for possible advancement.  No data recorded  Recommendations for follow up therapy are one component of a multi-disciplinary discharge planning process, led by the attending physician.  Recommendations may be updated based on patient status, additional functional criteria and insurance authorization. Assessment / Plan / Recommendation Clinical Impressions 07/06/2021 Clinical Impression Pt's pharyngeal swallow function has improved from prior study with significantly reduced pharyngeal edema. There was no aspiration but laryngeal penetration with cup and straw sip thin (PAS 3) due to decreased laryngeal closure. Intentional coughs were unsuccessful to clear and cervical collar prevented chin tuck. Nectar thick increased coordination of overall swallow did not enter airway. Residual in valleculae and pyriform sinuses was minimal. Oral mastication and manipulation were mildly delayed. Pt may initiate Dys 2, nectar thick liquids, pills crushed in puree and assist with feeding. ST will follow. SLP Visit Diagnosis Dysphagia, pharyngeal phase (R13.13) Attention and concentration deficit following -- Frontal lobe and executive function deficit following -- Impact on safety and function --   Treatment Recommendations 07/06/2021 Treatment Recommendations Therapy as outlined in treatment plan below   Prognosis 07/06/2021 Prognosis for Safe Diet Advancement Good Barriers to Reach Goals -- Barriers/Prognosis Comment -- Diet Recommendations 07/06/2021 SLP Diet Recommendations Dysphagia 2 (Fine chop) solids;Nectar thick liquid Liquid Administration via Cup;Straw Medication  Administration Crushed with puree Compensations Slow rate;Small sips/bites;Minimize environmental distractions;Clear throat intermittently Postural Changes Seated upright at 90 degrees   Other Recommendations 07/06/2021 Recommended Consults -- Oral Care Recommendations Oral care BID Other Recommendations -- Follow Up Recommendations Acute inpatient rehab (3hours/day) Assistance recommended at discharge -- Functional Status Assessment Patient has had a recent decline in their functional status and demonstrates the ability to make significant improvements in function in a reasonable and predictable amount of time. Frequency and Duration  07/06/2021 Speech Therapy Frequency (ACUTE ONLY) min 2x/week Treatment Duration 2 weeks   Oral Phase 07/06/2021 Oral Phase Impaired Oral - Pudding Teaspoon -- Oral - Pudding Cup -- Oral - Honey Teaspoon -- Oral - Honey Cup -- Oral - Nectar Teaspoon WFL Oral - Nectar Cup WFL Oral - Nectar Straw -- Oral - Thin Teaspoon WFL Oral - Thin Cup WFL Oral - Thin Straw WFL Oral - Puree -- Oral - Mech Soft Delayed oral transit Oral - Regular -- Oral - Multi-Consistency -- Oral - Pill -- Oral Phase - Comment --  Pharyngeal Phase 07/06/2021  Pharyngeal Phase Impaired Pharyngeal- Pudding Teaspoon -- Pharyngeal -- Pharyngeal- Pudding Cup -- Pharyngeal -- Pharyngeal- Honey Teaspoon -- Pharyngeal -- Pharyngeal- Honey Cup -- Pharyngeal -- Pharyngeal- Nectar Teaspoon Pharyngeal residue - valleculae Pharyngeal Material does not enter airway Pharyngeal- Nectar Cup Pharyngeal residue - pyriform Pharyngeal Material does not enter airway Pharyngeal- Nectar Straw NT Pharyngeal -- Pharyngeal- Thin Teaspoon Pharyngeal residue - valleculae;Pharyngeal residue - pyriform Pharyngeal Material does not enter airway Pharyngeal- Thin Cup Pharyngeal residue - valleculae;Pharyngeal residue - pyriform;Penetration/Aspiration during swallow Pharyngeal Material enters airway, remains ABOVE vocal cords and not ejected out  Pharyngeal- Thin Straw Penetration/Aspiration during swallow;Pharyngeal residue - valleculae;Reduced airway/laryngeal closure Pharyngeal Material enters airway, remains ABOVE vocal cords and not ejected out Pharyngeal- Puree NT Pharyngeal -- Pharyngeal- Mechanical Soft WFL Pharyngeal Material does not enter airway Pharyngeal- Regular NT Pharyngeal -- Pharyngeal- Multi-consistency NT Pharyngeal -- Pharyngeal- Pill NT Pharyngeal -- Pharyngeal Comment --  Cervical Esophageal Phase  07/06/2021 Cervical Esophageal Phase WFL Pudding Teaspoon -- Pudding Cup -- Honey Teaspoon -- Honey Cup -- Nectar Teaspoon -- Nectar Cup -- Nectar Straw -- Thin Teaspoon -- Thin Cup -- Thin Straw -- Puree -- Mechanical Soft -- Regular -- Multi-consistency -- Pill -- Cervical Esophageal Comment -- Royce MacadamiaLitaker, Lisa Willis 07/06/2021, 4:16 PM                      Medications: Scheduled:  Chlorhexidine Gluconate Cloth  6 each Topical Daily   mouth rinse  15 mL Mouth Rinse q12n4p   potassium chloride  40 mEq Oral BID   senna-docusate  1 tablet Per Tube BID   sodium chloride flush  3 mL Intravenous Q12H   tamsulosin  0.4 mg Oral Daily   Continuous:  sodium chloride 250 mL (06/29/21 1316)   lactated ringers      Assessment:  61 y.o. male who presented with acute worsening on a baseline of progressive bilateral upper and lower extremity weakness s/p recent fall who underwent C3-C7 ACDF on 2/3, then seen by Neurology on 2/7 for acute onset of decreased extremity movement, dysarthria, and subjective complaint of inability to tolerate his secretions. He was given a one time dose of Decadron 10 mg prior to CT imaging with some improvement in extremity movement following CT scan. Clinical changes on 2/7 were felt most likely to be due to complications from his cervical decompression. Subsequently, patient with staring spells this evening (2/11) concerning for seizure activity. STAT EEG and Ativan 2 mg IV were ordered. The patient's last dose of  cefepime was on 2/8. Flexeril has been held.  1. Exam reveals an awake patient without any comprehensible speech who is unable to follow commands but is able to engage in semi-purposeful movement. No seizures noted at the time of neurology assessment.  2. MRI brain performed 2/11: No acute intracranial abnormality. Minimal chronic small vessel ischemic disease. 3. TSH and ammonia are normal. Transaminases are elevated. Vitamin B12 is high suggestive of recent supplementation.  4. DDx for his new onset staring spells includes idiopathic transiently depressed level of consciousness, hypoglycemia, sedating effects of medications, transient hypotension and seizure.   Recommendations: 1. EEG being placed with report pending.  2. Avoid sedating medications.     LOS: 8 days   @Electronically  signed: Dr. Caryl PinaEric Dearion Huot@ 07/07/2021  7:16 PM

## 2021-07-08 ENCOUNTER — Inpatient Hospital Stay (HOSPITAL_COMMUNITY): Payer: 59

## 2021-07-08 DIAGNOSIS — R4182 Altered mental status, unspecified: Secondary | ICD-10-CM | POA: Diagnosis not present

## 2021-07-08 DIAGNOSIS — G959 Disease of spinal cord, unspecified: Secondary | ICD-10-CM | POA: Diagnosis not present

## 2021-07-08 LAB — COMPREHENSIVE METABOLIC PANEL
ALT: 213 U/L — ABNORMAL HIGH (ref 0–44)
AST: 153 U/L — ABNORMAL HIGH (ref 15–41)
Albumin: 2.8 g/dL — ABNORMAL LOW (ref 3.5–5.0)
Alkaline Phosphatase: 97 U/L (ref 38–126)
Anion gap: 10 (ref 5–15)
BUN: 17 mg/dL (ref 6–20)
CO2: 23 mmol/L (ref 22–32)
Calcium: 9.5 mg/dL (ref 8.9–10.3)
Chloride: 98 mmol/L (ref 98–111)
Creatinine, Ser: 0.76 mg/dL (ref 0.61–1.24)
GFR, Estimated: >60 mL/min (ref >60)
Glucose, Bld: 95 mg/dL (ref 70–99)
Potassium: 3.6 mmol/L (ref 3.5–5.1)
Sodium: 131 mmol/L — ABNORMAL LOW (ref 135–145)
Total Bilirubin: 1 mg/dL (ref 0.3–1.2)
Total Protein: 7.7 g/dL (ref 6.5–8.1)

## 2021-07-08 LAB — CBC
HCT: 31.8 % — ABNORMAL LOW (ref 39.0–52.0)
Hemoglobin: 10.8 g/dL — ABNORMAL LOW (ref 13.0–17.0)
MCH: 32.9 pg (ref 26.0–34.0)
MCHC: 34 g/dL (ref 30.0–36.0)
MCV: 97 fL (ref 80.0–100.0)
Platelets: 418 10*3/uL — ABNORMAL HIGH (ref 150–400)
RBC: 3.28 MIL/uL — ABNORMAL LOW (ref 4.22–5.81)
RDW: 13.6 % (ref 11.5–15.5)
WBC: 11 10*3/uL — ABNORMAL HIGH (ref 4.0–10.5)
nRBC: 0.6 % — ABNORMAL HIGH (ref 0.0–0.2)

## 2021-07-08 LAB — CULTURE, BLOOD (ROUTINE X 2)
Culture: NO GROWTH
Culture: NO GROWTH
Special Requests: ADEQUATE
Special Requests: ADEQUATE

## 2021-07-08 LAB — GLUCOSE, CAPILLARY: Glucose-Capillary: 99 mg/dL (ref 70–99)

## 2021-07-08 MED ORDER — MELATONIN 3 MG PO TABS
3.0000 mg | ORAL_TABLET | Freq: Every day | ORAL | Status: DC
  Administered 2021-07-09 – 2021-07-14 (×6): 3 mg via ORAL
  Filled 2021-07-08 (×6): qty 1

## 2021-07-08 MED ORDER — QUETIAPINE 12.5 MG HALF TABLET
12.5000 mg | ORAL_TABLET | Freq: Two times a day (BID) | ORAL | Status: DC
  Administered 2021-07-09 – 2021-07-13 (×10): 12.5 mg via ORAL
  Filled 2021-07-08 (×11): qty 1

## 2021-07-08 NOTE — Progress Notes (Signed)
vLTM started  all impedances below 10kohms   [facility] to monitor   Patient event button tested 

## 2021-07-08 NOTE — Progress Notes (Signed)
Neurology Progress Note  Subjective: Patient is calm throughout assessment this morning, does frequently attempt to reposition and appears slightly uncomfortable on exam.  Patient did have sustained tachycardia into the 150's overnight and into the 120's today on exam.  At one point during the examination, the patient fell asleep with snoring and his eyes were open. He did not blink to threat at this time but when aroused, he had blink to threat throughout  Exam: Vitals:   07/08/21 0737 07/08/21 1054  BP: 138/88 93/72  Pulse: (!) 113 (!) 112  Resp:  18  Temp:  98.2 F (36.8 C)  SpO2: 94% 98%   Gen: Sitting up in bed, appears uncomfortable intermittently, in no acute distress Resp: non-labored breathing, no respiratory distress on room air  Abd: soft, non-distended  Neuro: Mental Status: Drowsy on exam this morning but wakes to loud voice. He does drift off back to sleep a few times during exam. He is able to state his name and age correctly but does not answer further orientation questions. He does answer some yes/no questions appropriately with increased latency of responses.  He follows intermittent commands with repeat instruction.  Cranial Nerves: PERRL, EOMI, face is symmetric resting and with movement, hearing is intact to voice, tongue is midline.  Motor: He uses both of his arms throughout assessment to help reposition himself in the bed. He does not squeeze examiner's hand on either side. He is able to a elevate bilateral upper extremities at the elbow but complains of pain with passive elevation at the shoulder.  He is able to wiggle toes on bilateral lower extremities but does not attempt antigravity movement.  Sensory: Grimaces and withdraws to noxious stimuli throughout DTR: 3+ and symmetric throughout Bilateral nonsustained clonus with ankle dorsiflexion present.  Gait: Deferred  Pertinent Labs: CBC    Component Value Date/Time   WBC 11.0 (H) 07/08/2021 0454   RBC  3.28 (L) 07/08/2021 0454   HGB 10.8 (L) 07/08/2021 0454   HCT 31.8 (L) 07/08/2021 0454   PLT 418 (H) 07/08/2021 0454   MCV 97.0 07/08/2021 0454   MCH 32.9 07/08/2021 0454   MCHC 34.0 07/08/2021 0454   RDW 13.6 07/08/2021 0454   LYMPHSABS 2.2 07/07/2021 1913   MONOABS 1.3 (H) 07/07/2021 1913   EOSABS 0.3 07/07/2021 1913   BASOSABS 0.0 07/07/2021 1913   CMP     Component Value Date/Time   NA 131 (L) 07/08/2021 0454   K 3.6 07/08/2021 0454   CL 98 07/08/2021 0454   CO2 23 07/08/2021 0454   GLUCOSE 95 07/08/2021 0454   BUN 17 07/08/2021 0454   CREATININE 0.76 07/08/2021 0454   CALCIUM 9.5 07/08/2021 0454   PROT 7.7 07/08/2021 0454   ALBUMIN 2.8 (L) 07/08/2021 0454   AST 153 (H) 07/08/2021 0454   ALT 213 (H) 07/08/2021 0454   ALKPHOS 97 07/08/2021 0454   BILITOT 1.0 07/08/2021 0454   GFRNONAA >60 07/08/2021 0454   Imaging Reviewed:  MRI brain 2/11: 1. No acute intracranial abnormality. 2. Minimal chronic small vessel ischemic disease.  EEG pending  Assessment: 61 y.o. male who presented with acute worsening on a baseline of progressive bilateral upper and lower extremity weakness s/p recent fall who underwent C3-C7 ACDF on 2/3, then seen by Neurology on 2/7 for acute onset of decreased extremity movement, dysarthria, and subjective complaint of inability to tolerate his secretions. He was given a one time dose of Decadron 10 mg prior to CT imaging with  some improvement in extremity movement following CT scan. Clinical changes on 2/7 were felt most likely to be due to complications from his cervical decompression. Subsequently, patient with new onset of tremor the afternoon of 2/11, agitation, and questionable staring spells concerning for seizure activity. STAT EEG and Ativan 2 mg IV were ordered. The patient's last dose of cefepime was on 2/8. Flexeril has been held.  1. Exam improved today with drowsy patient. He is able to state his name and age, answers some yes/no questions  appropriately and follows commands intermittently.   2. MRI brain performed 2/11: No acute intracranial abnormality. Minimal chronic small vessel ischemic disease. 3. TSH and ammonia are normal. Transaminases are elevated. Vitamin B12 is high suggestive of recent supplementation.  4. EEG pending  5. DDx for his new onset staring spells includes idiopathic transiently depressed level of consciousness, hypoglycemia, sedating effects of medications, transient hypotension and seizure.   Recommendations: - Routine EEG pending - Will place on overnight EEG monitoring for further evaluation - Further recommendations to follow  Lanae Boast, AGACNP-BC [facility] [phone]  I have seen the patient reviewed the above note.  His exam is slightly bizarre, and given the concern I think more definitive evaluation and ruling out ongoing seizures would be prudent.  If his overnight EEG is negative, then I think seizures as a cause of these spells is significantly less likely.  Ritta Slot, MD [facility] [phone]  If 7pm- 7am, please page neurology on call as listed in AMION.

## 2021-07-08 NOTE — Progress Notes (Signed)
Providing Compassionate, Quality Care - Together   Subjective: Patient is still significantly confused this morning. Questionable seizure activity overnight. MRI and EEG ordered by Neurology and completed overnight. MRI without acute findings. Awaiting EEG results. Indwelling catheter replaced 07/07/2021. Flomax started, but patient unable to take due to NPO status.  Objective: Vital signs in last 24 hours: Temp:  [97.9 F (36.6 C)-98.8 F (37.1 C)] 98.2 F (36.8 C) (02/12 0718) Pulse Rate:  [78-113] 113 (02/12 0737) Resp:  [11-19] 14 (02/12 0718) BP: (112-149)/(78-106) 138/88 (02/12 0737) SpO2:  [94 %-97 %] 94 % (02/12 0737) Weight:  [59.3 kg] 59.3 kg (02/12 0500)  Intake/Output from previous day: 02/11 0701 - 02/12 0700 In: 3 [I.V.:3] Out: 650 [Urine:650] Intake/Output this shift: No intake/output data recorded.  Alert, oriented to self, agitated, confused MAE, Strength 4+/5, slightly weaker on the right Decreased fine motor Neck soft Incision is clean, dry, and intact Soft collar in place Foley catheter in place  Lab Results: Recent Labs    07/07/21 1913 07/08/21 0454  WBC 13.4* 11.0*  HGB 11.0* 10.8*  HCT 31.9* 31.8*  PLT 414* 418*   BMET Recent Labs    07/07/21 1913 07/08/21 0454  NA 133* 131*  K 3.7 3.6  CL 101 98  CO2 23 23  GLUCOSE 100* 95  BUN 15 17  CREATININE 0.70 0.76  CALCIUM 9.4 9.5    Studies/Results: CT HEAD WO CONTRAST (5MM)  Result Date: 07/07/2021 CLINICAL DATA:  Altered mental status EXAM: CT HEAD WITHOUT CONTRAST TECHNIQUE: Contiguous axial images were obtained from the base of the skull through the vertex without intravenous contrast. RADIATION DOSE REDUCTION: This exam was performed according to the departmental dose-optimization program which includes automated exposure control, adjustment of the mA and/or kV according to patient size and/or use of iterative reconstruction technique. COMPARISON:  07/03/2021 FINDINGS: Brain: No  evidence of acute infarction, hemorrhage, hydrocephalus, extra-axial collection or mass lesion/mass effect. Vascular: No hyperdense vessel or unexpected calcification. Skull: Normal. Negative for fracture or focal lesion. 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 Paranasal sinuses and mastoid air cells otherwise clear. Other: None. IMPRESSION: No acute intracranial abnormality. Electronically Signed   By: Davina Poke D.O.   On: 07/07/2021 13:56   MR BRAIN WO CONTRAST  Result Date: 07/07/2021 CLINICAL DATA:  Altered mental status. EXAM: MRI HEAD WITHOUT CONTRAST TECHNIQUE: Multiplanar, multiecho pulse sequences of the brain and surrounding structures were obtained without intravenous contrast. COMPARISON:  Head CT 07/07/2021 and MRI 06/19/2021 FINDINGS: Brain: There is no evidence of an acute infarct, intracranial hemorrhage, mass, midline shift, or extra-axial fluid collection. A punctate focus of trace diffusion weighted signal hyperintensity without reduced ADC in the left occipital lobe is unchanged. The ventricles and sulci are within normal limits for age. Scattered punctate T2 hyperintensities in the cerebral white matter bilaterally are unchanged and nonspecific but compatible with minimal chronic small vessel ischemic disease. Vascular: Major intracranial vascular flow voids are preserved. Skull and upper cervical spine: Unremarkable bone marrow signal para Sinuses/Orbits: Unremarkable orbits. Paranasal sinuses and mastoid air cells are clear. Other: None. IMPRESSION: 1. No acute intracranial abnormality. 2. Minimal chronic small vessel ischemic disease. Electronically Signed   By: Logan Bores M.D.   On: 07/07/2021 16:30   DG Swallowing Func-Speech Pathology  Result Date: 07/06/2021 Table formatting from the original result was not included. Objective Swallowing Evaluation: Type of Study: MBS-Modified Barium Swallow Study  Patient Details Name: Brian [name] MRN:  [id number] Date  of Birth: 1960/07/20 Today's Date: 07/06/2021 Time: SLP Start Time (ACUTE ONLY): 1435 -SLP Stop Time (ACUTE ONLY): 1449 SLP Time Calculation (min) (ACUTE ONLY): 14 min Past Medical History: No past medical history on file. Past Surgical History: Past Surgical History: Procedure Laterality Date  ANTERIOR CERVICAL DECOMPRESSION/DISCECTOMY FUSION 4 LEVELS N/A 06/29/2021  Procedure: Anterior Cervical Discectomy Fusion - Cervical Three-Cervical Four - Cervical Four- Cervical Five - Cervical Five- Cervical Six - Cervical Six- Cervical Seven;  Surgeon: Earnie Larsson, MD;  Location: [facility];  Service: Neurosurgery;  Laterality: N/A;  HEMATOMA EVACUATION N/A 07/03/2021  Procedure: EXPLORATION OF NECK AND REMOVE BLOOD CLOT;  Surgeon: Earnie Larsson, MD;  Location: [facility];  Service: Neurosurgery;  Laterality: N/A; HPI: 61 y/o admitted 06/29/21 following C3-7 ACDF after progressive cervical myelopathy symptoms with subsequent fall and severe incomplete SCI. No significant PMH.Repeat MBS for possible advancement.  No data recorded  Recommendations for follow up therapy are one component of a multi-disciplinary discharge planning process, led by the attending physician.  Recommendations may be updated based on patient status, additional functional criteria and insurance authorization. Assessment / Plan / Recommendation Clinical Impressions 07/06/2021 Clinical Impression Pt's pharyngeal swallow function has improved from prior study with significantly reduced pharyngeal edema. There was no aspiration but laryngeal penetration with cup and straw sip thin (PAS 3) due to decreased laryngeal closure. Intentional coughs were unsuccessful to clear and cervical collar prevented chin tuck. Nectar thick increased coordination of overall swallow did not enter airway. Residual in valleculae and pyriform sinuses was minimal. Oral mastication and manipulation were mildly delayed. Pt may initiate Dys 2, nectar thick liquids, pills crushed in  puree and assist with feeding. ST will follow. SLP Visit Diagnosis Dysphagia, pharyngeal phase (R13.13) Attention and concentration deficit following -- Frontal lobe and executive function deficit following -- Impact on safety and function --   Treatment Recommendations 07/06/2021 Treatment Recommendations Therapy as outlined in treatment plan below   Prognosis 07/06/2021 Prognosis for Safe Diet Advancement Good Barriers to Reach Goals -- Barriers/Prognosis Comment -- Diet Recommendations 07/06/2021 SLP Diet Recommendations Dysphagia 2 (Fine chop) solids;Nectar thick liquid Liquid Administration via Cup;Straw Medication Administration Crushed with puree Compensations Slow rate;Small sips/bites;Minimize environmental distractions;Clear throat intermittently Postural Changes Seated upright at 90 degrees   Other Recommendations 07/06/2021 Recommended Consults -- Oral Care Recommendations Oral care BID Other Recommendations -- Follow Up Recommendations Acute inpatient rehab (3hours/day) Assistance recommended at discharge -- Functional Status Assessment Patient has had a recent decline in their functional status and demonstrates the ability to make significant improvements in function in a reasonable and predictable amount of time. Frequency and Duration  07/06/2021 Speech Therapy Frequency (ACUTE ONLY) min 2x/week Treatment Duration 2 weeks   Oral Phase 07/06/2021 Oral Phase Impaired Oral - Pudding Teaspoon -- Oral - Pudding Cup -- Oral - Honey Teaspoon -- Oral - Honey Cup -- Oral - Nectar Teaspoon WFL Oral - Nectar Cup WFL Oral - Nectar Straw -- Oral - Thin Teaspoon WFL Oral - Thin Cup WFL Oral - Thin Straw WFL Oral - Puree -- Oral - Mech Soft Delayed oral transit Oral - Regular -- Oral - Multi-Consistency -- Oral - Pill -- Oral Phase - Comment --  Pharyngeal Phase 07/06/2021 Pharyngeal Phase Impaired Pharyngeal- Pudding Teaspoon -- Pharyngeal -- Pharyngeal- Pudding Cup -- Pharyngeal -- Pharyngeal- Honey Teaspoon --  Pharyngeal -- Pharyngeal- Honey Cup -- Pharyngeal -- Pharyngeal- Nectar Teaspoon Pharyngeal residue - valleculae Pharyngeal Material does not enter airway Pharyngeal- Nectar Cup Pharyngeal residue -  pyriform Pharyngeal Material does not enter airway Pharyngeal- Nectar Straw NT Pharyngeal -- Pharyngeal- Thin Teaspoon Pharyngeal residue - valleculae;Pharyngeal residue - pyriform Pharyngeal Material does not enter airway Pharyngeal- Thin Cup Pharyngeal residue - valleculae;Pharyngeal residue - pyriform;Penetration/Aspiration during swallow Pharyngeal Material enters airway, remains ABOVE vocal cords and not ejected out Pharyngeal- Thin Straw Penetration/Aspiration during swallow;Pharyngeal residue - valleculae;Reduced airway/laryngeal closure Pharyngeal Material enters airway, remains ABOVE vocal cords and not ejected out Pharyngeal- Puree NT Pharyngeal -- Pharyngeal- Mechanical Soft WFL Pharyngeal Material does not enter airway Pharyngeal- Regular NT Pharyngeal -- Pharyngeal- Multi-consistency NT Pharyngeal -- Pharyngeal- Pill NT Pharyngeal -- Pharyngeal Comment --  Cervical Esophageal Phase  07/06/2021 Cervical Esophageal Phase WFL Pudding Teaspoon -- Pudding Cup -- Honey Teaspoon -- Honey Cup -- Nectar Teaspoon -- Nectar Cup -- Nectar Straw -- Thin Teaspoon -- Thin Cup -- Thin Straw -- Puree -- Mechanical Soft -- Regular -- Multi-consistency -- Pill -- Cervical Esophageal Comment -- Brian [name] 07/06/2021, 4:16 PM                      Assessment/Plan: Patient status post C3-4, C4-5, C5-6, C6-7 anterior cervical discectomy with interbody fusion by Dr. Annette Stable on 06/29/2021. Increased difficulty swallowing with lung atelectasis and elevated temperatures on 07/02/2021. Made NPO following MBS by SLP. 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 CTA head and neck negative for stroke. MRI revealed epidural hematoma. Patient underwent exploration of his cervical fusion  with evacuation of the epidural hematoma on 07/03/2021. Cortrak was placed on 07/04/2021. Patient's strength much improved since epidural hematoma evacuation. Cortrak removed 07/06/2021 and dysphagia 2 diet with nectar thick liquids started. Patient with increasing agitation over the last three days. Discontinued steroids 07/06/2021. CIWA protocol started and discontinued 07/07/2021. CT and MRI 07/07/2021 negative.   LOS: 9 days   -EEG pending, appreciate Neurology's input. -Continue supportive efforts and efforts at mobilization. -Plan is for SNF when medically ready.   Viona Gilmore, DNP, AGNP-C Nurse Practitioner  [facility] [address] [place], [place], [place] [address] P: [phone]     F: [phone]  07/08/2021, 10:18 AM

## 2021-07-08 NOTE — Progress Notes (Signed)
EEG complete - results pending 

## 2021-07-08 NOTE — Progress Notes (Signed)
SLP Cancellation Note  Patient Details Name: Brian [name] MRN: [id number] DOB: [birth year]   Cancelled treatment:        New swallowing orders received and appreciated. Attempted to see pt for swallowing reassessment.  Spoke with RN.  Pt is not medically ready for PO trials at this time.  SLP will plan to reattempt next date.   Kerrie Pleasure, MA, CCC-SLP Acute Rehabilitation Services Office: [phone] 07/08/2021, 4:26 PM

## 2021-07-08 NOTE — Progress Notes (Addendum)
PROGRESS NOTE  Brian [name] [id number] DOB: July 26, 1960 DOA: 06/29/2021 PCP: Pcp, No  HPI/Recap of past 15 hours:  61 year old male with prior history of tobacco abuse and ETOH use who recently was found to have critical multilevel cervical spinal stenosis C3-6 with spinal cord compression and spinal cord signal change after recent fall with progressive upper and lower extremity weakness and tremors.  He was admitted on 2/3 and underwent ACDF of C3-4, C4-5, C5-5, and C6-7.  Post-operatively he was progressing slowly with residual weakness in both hands but improving lower extremity strength and function.  He developed some difficulty swallowing on 2/5 and SLP ordered and placed on thickened liquids.   Overnight 2/6, patient with increased difficulty swallowing and was made NPO but also noted to have dysarthria and difficulty moving extremities with numbness.  SLP evaluated and found to be moderate aspiration risk.   Also progressively tachycardic, tachypneic, and now febrile.  Code stroke activated and taken for CT/ CTA head and neck and was given decadron 10mg  once.  NIHSS 18.  CTH was negative for acute findings, and CTA head neck did not reveal any LVO but noted significant prevertebral soft tissue swelling with foci of gas present at the ventral epidural space at C4-5, small collection not excluded.  On 2/7, patient with worsening confusion and concern for airway involvement, PCCM consulted for further evaluation.  Patient underwent exploration of a cervical fusion with evacuation of the epidural hematoma on 07/03/2021.  Culture was placed on 07/04/2021 and removed on 07/06/2021.  CIWA protocol started on 07/07/2021 by primary team due to concern for alcohol withdrawal.  Hospital course complicated by delirium, likely in the setting of acute illness and recent surgery.  Work-up unrevealing thus far.  Appreciate neurology's input.  07/08/2021: Patient was seen and examined at his bedside.  Alert,  oriented x2, waxing and waning.  He denies having any pain.  Assessment/Plan: Principal Problem:   Cervical myelopathy (HCC) Active Problems:   Malnutrition of moderate degree  Sepsis, resolving, secondary to aspiration pneumonia  Unasyn DC'd on 07/07/2021. Maintain O2 saturation greater than 92%. Continue aspiration precautions.  Acute metabolic encephalopathy/delirium in the setting of recent surgery, acute illness. Work-up thus far unrevealing. CT head, MRI brain, nonacute. TSH, vitamin B12, unrevealing. Ammonia level normal. EEG results are pending Start delirium precautions, pharmacological and non-pharmacological. Reorient frequently Out of bed to chair with fall precautions during the day. Regulate sleep and wake cycle, open blinds during the day, melatonin nightly. Seroquel 12.5 mg twice daily, wean off as tolerated. Avoid sedative agents   Severe cervical spinal stenosis with spinal cord compression s/p ACDF C3-C7 on 2/3 c/b epidural hematoma s/p evacuation 2/7 Exam drastically improved s/p evacuation.  Speech therapist consulted.  May need core track placement. Continue aspiration precautions   Resolving leukocytosis with recent IV steroids Now off IV Decadron, afebrile. WBC is down trending Off antibiotics.  WBC 11,000 from 18,000  Loose stools, afebrile Monitor for now off antibiotics and off IV steroids Continue to monitor fever curve and WBC.  Resolved elevated lactic acid, suspect contributed by dehydration Ongoing gentle IV fluid hydration LR 50 cc/h x 2 days Lactic acid has normalized.  Questionable alcohol withdrawal Out of window for withdrawal Continue multivitamins, thiamine, folic acid supplements. Hold off benzodiazepine to avoid worsening of delirium.   Daily tobacco use  Tobacco cessation education when appropriate   Physical debility Continue fall precautions PT OT assessment recommending SNF CSW assisting with SNF placement.  Highly  appreciated.  Severe protein calorie malnutrition BMI 18 Severe muscle mass loss May need core track placement.  Resolved hyperglycemia likely secondary to ongoing steroid use Hemoglobin A1c 5.2 on 07/05/2021. Avoid hypoglycemia CBG every 8 hours when n.p.o.   Critical care time: 65 minutes.   Code Status: Full code  Family Communication: Updated his son via phone on 07/07/2021.  Disposition Plan: Likely will discharge to SNF.  Thank you for allowing [place] to participate in the care of this patient.  We will continue to follow along with you.      Objective: Vitals:   07/08/21 0500 07/08/21 0718 07/08/21 0737 07/08/21 1054  BP:  (!) 149/94 138/88 93/72  Pulse:  (!) 112 (!) 113 (!) 112  Resp:  14  18  Temp:  98.2 F (36.8 C)  98.2 F (36.8 C)  TempSrc:  Axillary  Axillary  SpO2:  97% 94% 98%  Weight: 59.3 kg     Height:        Intake/Output Summary (Last 24 hours) at 07/08/2021 1503 Last data filed at 07/08/2021 0600 Gross per 24 hour  Intake 3 ml  Output 650 ml  Net -647 ml   Filed Weights   07/06/21 0452 07/07/21 0500 07/08/21 0500  Weight: 59.3 kg 59.3 kg 59.3 kg    Exam:  [name]: 61 y.o. year-old male frail-appearing in no acute distress.  He is alert and oriented x2.   Cardiovascular: Regular rate and rhythm no rubs or gallops. Respiratory: Clear to auscultation no wheezes or rales. Abdomen: Soft monitor normal bowel sounds present. Musculoskeletal: No lower extremity edema bilaterally. Skin: No ulcerative lesions noted. Psychiatry: Mood is appropriate for condition and setting.    .   Data Reviewed: CBC: Recent Labs  Lab 07/02/21 0515 07/03/21 0343 07/05/21 0336 07/06/21 0346 07/07/21 0312 07/07/21 1913 07/08/21 0454  WBC 16.3*   < > 10.4 15.3* 18.0* 13.4* 11.0*  NEUTROABS 11.2*  --   --   --   --  9.4*  --   HGB 11.0*   < > 9.2* 9.8* 10.7* 11.0* 10.8*  HCT 32.6*   < > 27.0* 28.7* 31.6* 31.9* 31.8*  MCV 101.6*   < > 99.3 96.6 97.2  97.0 97.0  PLT 189   < > 286 340 427* 414* 418*   < > = values in this interval not displayed.   Basic Metabolic Panel: Recent Labs  Lab 07/05/21 0336 07/06/21 0346 07/07/21 0312 07/07/21 1913 07/08/21 0454  NA 137 134* 135 133* 131*  K 4.0 3.8 3.3* 3.7 3.6  CL 107 100 100 101 98  CO2 23 24 24 23 23   GLUCOSE 166* 121* 110* 100* 95  BUN 26* 21* 19 15 17   CREATININE 0.67 0.67 0.79 0.70 0.76  CALCIUM 9.3 9.5 9.7 9.4 9.5  MG  --   --   --  1.8  --   PHOS  --   --   --  3.0  --    GFR: Estimated Creatinine Clearance: 82.4 mL/min (by C-G formula based on SCr of 0.76 mg/dL). Liver Function Tests: Recent Labs  Lab 07/07/21 1913 07/08/21 0454  AST 157* 153*  ALT 209* 213*  ALKPHOS 98 97  BILITOT 1.1 1.0  PROT 8.0 7.7  ALBUMIN 2.9* 2.8*   No results for input(s): LIPASE, AMYLASE in the last 168 hours. Recent Labs  Lab 07/07/21 1913  AMMONIA 34   Coagulation Profile: No results for input(s): INR, PROTIME in the  last 168 hours. Cardiac Enzymes: No results for input(s): CKTOTAL, CKMB, CKMBINDEX, TROPONINI in the last 168 hours. BNP (last 3 results) No results for input(s): PROBNP in the last 8760 hours. HbA1C: No results for input(s): HGBA1C in the last 72 hours.  CBG: Recent Labs  Lab 07/06/21 1548 07/06/21 2011 07/06/21 2315 07/07/21 0308 07/07/21 0809  GLUCAP 104* 108* 103* 120* 89   Lipid Profile: No results for input(s): CHOL, HDL, LDLCALC, TRIG, CHOLHDL, LDLDIRECT in the last 72 hours. Thyroid Function Tests: Recent Labs    07/07/21 1913  TSH 4.004   Anemia Panel: Recent Labs    07/07/21 1913  VITAMINB12 1,328*   Urine analysis:    Component Value Date/Time   COLORURINE YELLOW 07/03/2021 1110   APPEARANCEUR CLEAR 07/03/2021 1110   LABSPEC 1.010 07/03/2021 1110   PHURINE 6.0 07/03/2021 1110   GLUCOSEU NEGATIVE 07/03/2021 1110   HGBUR TRACE (A) 07/03/2021 1110   BILIRUBINUR NEGATIVE 07/03/2021 1110   KETONESUR >80 (A) 07/03/2021 1110    PROTEINUR NEGATIVE 07/03/2021 1110   NITRITE NEGATIVE 07/03/2021 1110   LEUKOCYTESUR NEGATIVE 07/03/2021 1110   Sepsis Labs: @LABRCNTIP (procalcitonin:4,lacticidven:4)  ) Recent Results (from the past 240 hour(s))  SARS Coronavirus 2 by RT PCR (hospital order, performed in [facility] hospital lab) Nasopharyngeal Nasopharyngeal Swab     Status: None   Collection Time: 06/29/21  6:00 AM   Specimen: Nasopharyngeal Swab  Result Value Ref Range Status   SARS Coronavirus 2 NEGATIVE NEGATIVE Final    Comment: (NOTE) SARS-CoV-2 target nucleic acids are NOT DETECTED.  The SARS-CoV-2 RNA is generally detectable in upper and lower respiratory specimens during the acute phase of infection. The lowest concentration of SARS-CoV-2 viral copies this assay can detect is 250 copies / mL. A negative result does not preclude SARS-CoV-2 infection and should not be used as the sole basis for treatment or other patient management decisions.  A negative result may occur with improper specimen collection / handling, submission of specimen other than nasopharyngeal swab, presence of viral mutation(s) within the areas targeted by this assay, and inadequate number of viral copies (<250 copies / mL). A negative result must be combined with clinical observations, patient history, and epidemiological information.  Fact Sheet for Patients:   StrictlyIdeas.no  Fact Sheet for Healthcare Providers: BankingDealers.co.za  This test is not yet approved or  cleared by the [place] FDA and has been authorized for detection and/or diagnosis of SARS-CoV-2 by FDA under an Emergency Use Authorization (EUA).  This EUA will remain in effect (meaning this test can be used) for the duration of the COVID-19 declaration under Section 564(b)(1) of the Act, 21 U.S.C. section 360bbb-3(b)(1), unless the authorization is terminated or revoked sooner.  Performed at [facility], [place] [address], [place], [place] [address]   Surgical PCR Screen     Status: None   Collection Time: 06/29/21  6:16 AM   Specimen: Nasal Mucosa; Nasal Swab  Result Value Ref Range Status   MRSA, PCR NEGATIVE NEGATIVE Final   Staphylococcus aureus NEGATIVE NEGATIVE Final    Comment: (NOTE) The Xpert SA Assay (FDA approved for NASAL specimens in patients 78 years of age and older), is one component of a comprehensive surveillance program. It is not intended to diagnose infection nor to guide or monitor treatment. Performed at [facility], [place] [address] [place], [place] [address]   Urine Culture     Status: None   Collection Time: 07/03/21 11:10  AM   Specimen: Urine, Catheterized  Result Value Ref Range Status   Specimen Description URINE, CATHETERIZED  Final   Special Requests NONE  Final   Culture   Final    NO GROWTH Performed at [facility], [address], [place], [place] [address]    Report Status 07/04/2021 FINAL  Final  MRSA Next Gen by PCR, Nasal     Status: None   Collection Time: 07/03/21 12:29 PM   Specimen: Nasal Mucosa; Nasal Swab  Result Value Ref Range Status   MRSA by PCR Next Gen NOT DETECTED NOT DETECTED Final    Comment: (NOTE) The GeneXpert MRSA Assay (FDA approved for NASAL specimens only), is one component of a comprehensive MRSA colonization surveillance program. It is not intended to diagnose MRSA infection nor to guide or monitor treatment for MRSA infections. Test performance is not FDA approved in patients less than 60 years old. Performed at [facility], [place] [address], [place], [place] [address]   Culture, blood (routine x 2)     Status: None   Collection Time: 07/03/21 12:53 PM   Specimen: BLOOD RIGHT HAND  Result Value Ref Range Status   Specimen Description BLOOD RIGHT HAND  Final   Special Requests   Final    BOTTLES DRAWN AEROBIC AND ANAEROBIC Blood Culture adequate volume   Culture   Final    NO GROWTH  5 DAYS Performed at [facility], [place] [address], [place], [place] [address]    Report Status 07/08/2021 FINAL  Final  Culture, blood (routine x 2)     Status: None   Collection Time: 07/03/21  1:08 PM   Specimen: BLOOD LEFT HAND  Result Value Ref Range Status   Specimen Description BLOOD LEFT HAND  Final   Special Requests   Final    BOTTLES DRAWN AEROBIC AND ANAEROBIC Blood Culture adequate volume   Culture   Final    NO GROWTH 5 DAYS Performed at [facility], [place] [address], [place],  [address]    Report Status 07/08/2021 FINAL  Final      Studies: MR BRAIN WO CONTRAST  Result Date: 07/07/2021 CLINICAL DATA:  Altered mental status. EXAM: MRI HEAD WITHOUT CONTRAST TECHNIQUE: Multiplanar, multiecho pulse sequences of the brain and surrounding structures were obtained without intravenous contrast. COMPARISON:  Head CT 07/07/2021 and MRI 06/19/2021 FINDINGS: Brain: There is no evidence of an acute infarct, intracranial hemorrhage, mass, midline shift, or extra-axial fluid collection. A punctate focus of trace diffusion weighted signal hyperintensity without reduced ADC in the left occipital lobe is unchanged. The ventricles and sulci are within normal limits for age. Scattered punctate T2 hyperintensities in the cerebral white matter bilaterally are unchanged and nonspecific but compatible with minimal chronic small vessel ischemic disease. Vascular: Major intracranial vascular flow voids are preserved. Skull and upper cervical spine: Unremarkable bone marrow signal para Sinuses/Orbits: Unremarkable orbits. Paranasal sinuses and mastoid air cells are clear. Other: None. IMPRESSION: 1. No acute intracranial abnormality. 2. Minimal chronic small vessel ischemic disease. Electronically Signed   By: Logan Bores M.D.   On: 07/07/2021 16:30    Scheduled Meds:  Chlorhexidine Gluconate Cloth  6 each Topical Daily   mouth rinse  15 mL Mouth Rinse q12n4p   senna-docusate  1  tablet Per Tube BID   sodium chloride flush  3 mL Intravenous Q12H   tamsulosin  0.4 mg Oral Daily    Continuous Infusions:  sodium chloride 250 mL (06/29/21 1316)   lactated ringers       LOS: 9 days     Kayleen Memos, MD [facility] Pager [phone]  If 7PM-7AM, please contact night-coverage [email or web address] Password [facility] 07/08/2021, 3:03 PM

## 2021-07-08 NOTE — Progress Notes (Signed)
MD ordered  Metoprolol 5 mg for sustained and elevated HR. Med administered as ordered. HR=98. Will continue monitoring.

## 2021-07-09 DIAGNOSIS — G959 Disease of spinal cord, unspecified: Secondary | ICD-10-CM | POA: Diagnosis not present

## 2021-07-09 LAB — COMPREHENSIVE METABOLIC PANEL
ALT: 189 U/L — ABNORMAL HIGH (ref 0–44)
AST: 102 U/L — ABNORMAL HIGH (ref 15–41)
Albumin: 2.9 g/dL — ABNORMAL LOW (ref 3.5–5.0)
Alkaline Phosphatase: 95 U/L (ref 38–126)
Anion gap: 11 (ref 5–15)
BUN: 16 mg/dL (ref 6–20)
CO2: 22 mmol/L (ref 22–32)
Calcium: 9.5 mg/dL (ref 8.9–10.3)
Chloride: 97 mmol/L — ABNORMAL LOW (ref 98–111)
Creatinine, Ser: 0.81 mg/dL (ref 0.61–1.24)
GFR, Estimated: >60 mL/min (ref >60)
Glucose, Bld: 92 mg/dL (ref 70–99)
Potassium: 3.9 mmol/L (ref 3.5–5.1)
Sodium: 130 mmol/L — ABNORMAL LOW (ref 135–145)
Total Bilirubin: 1.2 mg/dL (ref 0.3–1.2)
Total Protein: 7.8 g/dL (ref 6.5–8.1)

## 2021-07-09 LAB — CBC
HCT: 30.6 % — ABNORMAL LOW (ref 39.0–52.0)
Hemoglobin: 10.6 g/dL — ABNORMAL LOW (ref 13.0–17.0)
MCH: 33.8 pg (ref 26.0–34.0)
MCHC: 34.6 g/dL (ref 30.0–36.0)
MCV: 97.5 fL (ref 80.0–100.0)
Platelets: 470 10*3/uL — ABNORMAL HIGH (ref 150–400)
RBC: 3.14 MIL/uL — ABNORMAL LOW (ref 4.22–5.81)
RDW: 13.9 % (ref 11.5–15.5)
WBC: 9.6 10*3/uL (ref 4.0–10.5)
nRBC: 0 % (ref 0.0–0.2)

## 2021-07-09 LAB — GLUCOSE, CAPILLARY
Glucose-Capillary: 95 mg/dL (ref 70–99)
Glucose-Capillary: 134 mg/dL — ABNORMAL HIGH (ref 70–99)
Glucose-Capillary: 131 mg/dL — ABNORMAL HIGH (ref 70–99)
Glucose-Capillary: 136 mg/dL — ABNORMAL HIGH (ref 70–99)

## 2021-07-09 MED ORDER — METOPROLOL TARTRATE 25 MG PO TABS
25.0000 mg | ORAL_TABLET | Freq: Two times a day (BID) | ORAL | Status: DC
  Filled 2021-07-09 (×2): qty 1

## 2021-07-09 MED ORDER — METOPROLOL TARTRATE 12.5 MG HALF TABLET: 12.5000 mg | ORAL_TABLET | Freq: Two times a day (BID) | ORAL | Status: DC

## 2021-07-09 MED ORDER — METOPROLOL TARTRATE 12.5 MG HALF TABLET
12.5000 mg | ORAL_TABLET | Freq: Two times a day (BID) | ORAL | Status: DC
  Administered 2021-07-09 – 2021-07-12 (×7): 12.5 mg via ORAL
  Filled 2021-07-09 (×7): qty 1

## 2021-07-09 MED ORDER — SODIUM CHLORIDE 0.9 % IV BOLUS
250.0000 mL | INTRAVENOUS | Status: AC
  Administered 2021-07-09: 250 mL via INTRAVENOUS

## 2021-07-09 MED ORDER — ADULT MULTIVITAMIN W/MINERALS CH
1.0000 | ORAL_TABLET | Freq: Every day | ORAL | Status: DC
  Administered 2021-07-09 – 2021-07-15 (×7): 1 via ORAL
  Filled 2021-07-09 (×8): qty 1

## 2021-07-09 NOTE — Progress Notes (Signed)
Occupational Therapy Treatment Patient Details Name: Brian [name] MRN: [id number] DOB: July 12, 1960 Today's Date: 07/09/2021   History of present illness Pt is a 61 y/o male admitted 06/29/21 following C3-7 ACDF after progressive cervical myelopathy symptoms with subsequent fall and severe incomplete SCI. No significant PMH. 2/8: Status post reexploration of anterior cervical fusion and removal of epidural hematoma.   OT comments  Patient instructed on bed mobility and max assist to get to EOB.  Stand pivot transfers performed to recliner with face to face transfer for safety with max assist. Patient was positioned in recliner to support up right posture and address self feeding with built up utensils. Patient was able to hold utensil but required support at elbow to allow patient to scoop food and bring to mouth.  Patient declined further eating after a few bites.  Acute OT to continue to follow.    Recommendations for follow up therapy are one component of a multi-disciplinary discharge planning process, led by the attending physician.  Recommendations may be updated based on patient status, additional functional criteria and insurance authorization.    Follow Up Recommendations  Skilled nursing-short term rehab (<3 hours/day)    Assistance Recommended at Discharge Frequent or constant Supervision/Assistance  Patient can return home with the following  Two people to help with walking and/or transfers;A lot of help with bathing/dressing/bathroom;Direct supervision/assist for medications management;Assist for transportation;Help with stairs or ramp for entrance;Assistance with feeding   Equipment Recommendations  Wheelchair (measurements OT);Wheelchair cushion (measurements OT);Hospital bed;BSC/3in1;Tub/shower seat    Recommendations for Other Services      Precautions / Restrictions Precautions Precautions: Fall;Cervical Precaution Booklet Issued: Yes (comment) Required Braces or  Orthoses: Cervical Brace Cervical Brace: Soft collar;At all times       Mobility Bed Mobility Overal bed mobility: Needs Assistance Bed Mobility: Rolling, Sidelying to Sit Rolling: Mod assist Sidelying to sit: Max assist       General bed mobility comments: required assistance for BUE and trunk to get to EOB    Transfers Overall transfer level: Needs assistance Equipment used: None Transfers: Sit to/from Stand, Bed to chair/wheelchair/BSC Sit to Stand: Max assist, From elevated surface Stand pivot transfers: Max assist         General transfer comment: patient required max assist to stand from EOB and pivot to recliner     Balance Overall balance assessment: Needs assistance Sitting-balance support: Feet supported Sitting balance-Leahy Scale: Fair Sitting balance - Comments: close supervision for sitting balance on EOB Postural control: Posterior lean Standing balance support: Bilateral upper extremity supported Standing balance-Leahy Scale: Poor Standing balance comment: reliant on therapist for standing balance                           ADL either performed or assessed with clinical judgement   ADL Overall ADL's : Needs assistance/impaired Eating/Feeding: Moderate assistance;With adaptive utensils;Sitting Eating/Feeding Details (indicate cue type and reason): performed in recliner with support at elbow and built up utensils                                   General ADL Comments: addressed self feeding in recliner    Extremity/Trunk Assessment Upper Extremity Assessment RUE Deficits / Details: shoulder 2-/5 strength; elbow 2-/5 strength, wrist and hand 2 to 2-/5; digits 2-/5 RUE Sensation: decreased light touch RUE Coordination: decreased fine motor;decreased gross motor LUE Deficits /  Details: shoulder 2-/5 strength; elbow 2-/5 strength, wrist and hand 2 to 2-/5; digits 2-/5 LUE Sensation: decreased light touch LUE Coordination:  decreased fine motor;decreased gross motor            Vision       Perception     Praxis      Cognition Arousal/Alertness: Awake/alert Behavior During Therapy: Agitated Overall Cognitive Status: Impaired/Different from baseline Area of Impairment: Safety/judgement, Memory                     Memory: Decreased short-term memory, Decreased recall of precautions   Safety/Judgement: Decreased awareness of deficits     General Comments: unable to give time and date. Agitated towards nursing for not being able to give him a shower        Exercises      Shoulder Instructions       General Comments      Pertinent Vitals/ Pain       Pain Assessment Pain Assessment: Faces Faces Pain Scale: Hurts even more Pain Location: neck and back Pain Descriptors / Indicators: Aching, Grimacing Pain Intervention(s): Limited activity within patient's tolerance, Monitored during session, Repositioned  Home Living                                          Prior Functioning/Environment              Frequency  Min 2X/week        Progress Toward Goals  OT Goals(current goals can now be found in the care plan section)  Progress towards OT goals: Progressing toward goals  Acute Rehab OT Goals Patient Stated Goal: go home OT Goal Formulation: With patient Time For Goal Achievement: 07/18/21 Potential to Achieve Goals: Fair ADL Goals Pt Will Perform Eating: with min assist;with adaptive utensils;sitting Pt Will Perform Grooming: with min assist;sitting Pt Will Transfer to Toilet: with min assist;stand pivot transfer;bedside commode Pt/caregiver will Perform Home Exercise Program: Increased strength;Both right and left upper extremity;With theraputty;With minimal assist;With written HEP provided  Plan Discharge plan remains appropriate    Co-evaluation                 AM-PAC OT "6 Clicks" Daily Activity     Outcome Measure   Help  from another person eating meals?: A Lot Help from another person taking care of personal grooming?: A Lot Help from another person toileting, which includes using toliet, bedpan, or urinal?: Total Help from another person bathing (including washing, rinsing, drying)?: A Lot Help from another person to put on and taking off regular upper body clothing?: A Lot Help from another person to put on and taking off regular lower body clothing?: Total 6 Click Score: 10    End of Session Equipment Utilized During Treatment: Cervical collar  OT Visit Diagnosis: Unsteadiness on feet (R26.81);Muscle weakness (generalized) (M62.81);Ataxia, unspecified (R27.0);Feeding difficulties (R63.3)   Activity Tolerance Patient tolerated treatment well   Patient Left in chair;with call bell/phone within reach   Nurse Communication Mobility status        Time: 1100-1129 OT Time Calculation (min): 29 min  Charges: OT General Charges $OT Visit: 1 Visit OT Treatments $Self Care/Home Management : 8-22 mins $Therapeutic Activity: 8-22 mins  Lodema Hong, [place]  Pager [phone] Office [phone]   Trixie Dredge 07/09/2021, 11:44 AM

## 2021-07-09 NOTE — Progress Notes (Signed)
Nutrition Follow-up  DOCUMENTATION CODES:   Non-severe (moderate) malnutrition in context of social or environmental circumstances  INTERVENTION:   - Mighty Shake TID with meals, each supplement provides 330 kcal and 9 grams of protein  - Double protein portions TID with meals  - MVI with minerals daily  NUTRITION DIAGNOSIS:   Moderate Malnutrition (in the context of social/environmental circumstances) related to  (inadequate energy intake) as evidenced by mild fat depletion, moderate muscle depletion, severe muscle depletion.  Ongoing, being addressed via diet advancement and oral nutrition supplements  GOAL:   Patient will meet greater than or equal to 90% of their needs  Progressing  MONITOR:   TF tolerance, Diet advancement, Labs  REASON FOR ASSESSMENT:   Consult Assessment of nutrition requirement/status  ASSESSMENT:   61 year old male with hx EtOH abuse, tobacco use, and spinal stenosis initially presented 2/3 for planned multilevel anterior cervical decompression and fusion surgery after experiencing progressive bilateral upper and lower extremity weakness and spasticity due to critical multilevel cervical spinal stenosis with spinal cord compression and signal change. Post-op recovery was complicated and was ultimately taken back to OR 2/7.  02/03 - s/p anterior cervical discectomy with interbody fusion  02/05 - pt initially complained of worsening swallowing function 02/06 - MBS, NPO per SLP 02/07 - Code Stroke activated (negative) but transferred to ICU, s/p re-exploration anterior cervical fusion with evacuation of epidural hematoma 02/08 - Cortrak tube placed (tip gastric), tube feeds initiated 02/10 - MBS, diet advanced to dysphagia 2 with nectar-thick liquids, Cortrak removed 02/11 - NPO 02/13 - diet advanced to dysphagia 2 with nectar-thick liquids  RD consulted for nutrition assessment. Noted Cortrak removed on 2/10. Pt's diet was advanced back to  dysphagia 2 with nectar-thick liquids this morning. No meal completions charted since 07/01/21.  Pt unavailable at times of RD attempted visits x 2. RD to order nectar-thick oral nutrition supplements to come with pt's meals to optimize nutrition via PO route. Will also order double protein portions as well as MVI with minerals daily.  Admit weight: 61.7 kg Current weight: 59.3 kg  Meal Completion: 75% x 1 meal on 2/05  Medications reviewed and include: melatonin, senna  Labs reviewed: sodium 130, elevated LFTs CBG's: 95-99 x 24 hours  UOP: 1300 ml x 24 hours I/O's: -3.8 L since admit  Diet Order:   Diet Order             DIET DYS 2 Room service appropriate? Yes; Fluid consistency: Nectar Thick  Diet effective now                   EDUCATION NEEDS:   Education needs have been addressed  Skin:  Skin Assessment: Reviewed RN Assessment (surgical incisions to the neck)  Last BM:  07/08/21 type 7  Height:   Ht Readings from Last 1 Encounters:  06/29/21 5\' 10"  (1.778 m)    Weight:   Wt Readings from Last 1 Encounters:  07/09/21 59.3 kg    Ideal Body Weight:  78.2 kg  BMI:  Body mass index is 18.76 kg/m.  Estimated Nutritional Needs:   Kcal:  1800-2000 kcal/d  Protein:  90-105 g/d  Fluid:  > 2L/d    Gustavus Bryant, MS, RD, LDN Inpatient Clinical Dietitian Please see AMiON for contact information.

## 2021-07-09 NOTE — Progress Notes (Signed)
Speech Language Pathology Treatment: Dysphagia  Patient Details Name: Brian [name] MRN: [id number] DOB: 04-30-1961 Today's Date: 07/09/2021 Time: 3976-7341 SLP Time Calculation (min) (ACUTE ONLY): 11 min  Assessment / Plan / Recommendation Clinical Impression  Pt's mental status has greatly improved. He was conversive and able to consume nectar thick and upgraded Dys 3 trial. Needs full assist for feeding given bilateral hand weakness. Delayed cough/throat clear towards end of session. He appears close if not back to his baseline from swallow standpoint. Will resume Dys 2, nectar thick liquids with full supervision. Continue ST.   HPI HPI: 61 y/o admitted 06/29/21 following C3-7 ACDF after progressive cervical myelopathy symptoms with subsequent fall and severe incomplete SCI. No significant PMH.Repeat MBS for possible advancement.      SLP Plan  Continue with current plan of care      Recommendations for follow up therapy are one component of a multi-disciplinary discharge planning process, led by the attending physician.  Recommendations may be updated based on patient status, additional functional criteria and insurance authorization.    Recommendations  Diet recommendations: Dysphagia 2 (fine chop);Nectar-thick liquid Liquids provided via: Cup;Straw Medication Administration: Crushed with puree Supervision: Staff to assist with self feeding;Full supervision/cueing for compensatory strategies Compensations: Slow rate;Small sips/bites;Minimize environmental distractions;Clear throat intermittently                Oral Care Recommendations: Oral care BID Follow Up Recommendations: Acute inpatient rehab (3hours/day) Assistance recommended at discharge: Intermittent Supervision/Assistance SLP Visit Diagnosis: Dysphagia, pharyngeal phase (R13.13) Plan: Continue with current plan of care           [name]  07/09/2021, 9:27 AM

## 2021-07-09 NOTE — Anesthesia Postprocedure Evaluation (Signed)
Anesthesia Post Note  Patient: Brian [name]  Procedure(s) Performed: EXPLORATION OF NECK AND REMOVE BLOOD CLOT (Spine Cervical)     Patient location during evaluation: PACU Anesthesia Type: General Level of consciousness: awake and alert Pain management: pain level controlled Vital Signs Assessment: post-procedure vital signs reviewed and stable Respiratory status: spontaneous breathing, nonlabored ventilation, respiratory function stable and patient connected to nasal cannula oxygen Cardiovascular status: blood pressure returned to baseline and stable Postop Assessment: no apparent nausea or vomiting Anesthetic complications: no   No notable events documented.  Last Vitals:  Vitals:   07/08/21 1957 07/09/21 0000  BP: (!) 154/97 (!) 165/98  Pulse: (!) 129 (!) 120  Resp:    Temp: 37.1 C 37.2 C  SpO2: 96%     Last Pain:  Vitals:   07/09/21 0542  TempSrc:   PainSc: 2                  Liv Rallis

## 2021-07-09 NOTE — Progress Notes (Addendum)
Neurology Progress Note  Subjective: No changes overnight per ICU nurse. No seizures reported. LTM connected. He has no complaints. Wants to know when he can go home.   Exam: Vitals:   07/09/21 0000 07/09/21 0700  BP: (!) 165/98 (!) 141/99  Pulse: (!) 120   Resp:  18  Temp: 98.9 F (37.2 C) 98.3 F (36.8 C)  SpO2:     Gen: NAD. Neuro: Mental Status: alert and awake. Oriented to person, place and time. Cranial Nerves: PERRL, EOMI. Motor: He is able to wiggle toes on bilateral lower extremities but does not attempt antigravity movement.  Sensory: Grimaces and withdraws to noxious stimuli throughout DTR: 3+ and symmetric throughout Bilateral nonsustained clonus with ankle dorsiflexion present.  Gait: Deferred  Pertinent Labs: CBC    Component Value Date/Time   WBC 9.6 07/09/2021 0649   RBC 3.14 (L) 07/09/2021 0649   HGB 10.6 (L) 07/09/2021 0649   HCT 30.6 (L) 07/09/2021 0649   PLT 470 (H) 07/09/2021 0649   MCV 97.5 07/09/2021 0649   MCH 33.8 07/09/2021 0649   MCHC 34.6 07/09/2021 0649   RDW 13.9 07/09/2021 0649   LYMPHSABS 2.2 07/07/2021 1913   MONOABS 1.3 (H) 07/07/2021 1913   EOSABS 0.3 07/07/2021 1913   BASOSABS 0.0 07/07/2021 1913   CMP     Component Value Date/Time   NA 130 (L) 07/09/2021 0649   K 3.9 07/09/2021 0649   CL 97 (L) 07/09/2021 0649   CO2 22 07/09/2021 0649   GLUCOSE 92 07/09/2021 0649   BUN 16 07/09/2021 0649   CREATININE 0.81 07/09/2021 0649   CALCIUM 9.5 07/09/2021 0649   PROT 7.8 07/09/2021 0649   ALBUMIN 2.9 (L) 07/09/2021 0649   AST 102 (H) 07/09/2021 0649   ALT 189 (H) 07/09/2021 0649   ALKPHOS 95 07/09/2021 0649   BILITOT 1.2 07/09/2021 0649   GFRNONAA >60 07/09/2021 0649   Imaging Reviewed:  MRI brain 2/11: 1. No acute intracranial abnormality. 2. Minimal chronic small vessel ischemic disease.  LTM 2/12-13:   Impression: This was an abnormal continuous video EEG due to generalized slowing, indicative of a non-specific  encephalopathy pattern. There were no seizures or epileptiform discharges.   Assessment: 61 y.o. male who presented with acute worsening on a baseline of progressive bilateral upper and lower extremity weakness s/p recent fall who underwent C3-C7 ACDF on 2/3, then seen by Neurology on 2/7 for acute onset of decreased extremity movement, dysarthria, and subjective complaint of inability to tolerate his secretions. He was given a one time dose of Decadron 10 mg prior to CT imaging with some improvement in extremity movement following CT scan. Clinical changes on 2/7 were felt most likely to be due to complications from his cervical decompression. Subsequently, patient with new onset of tremor the afternoon of 2/11, agitation, and questionable staring spells concerning for seizure activity. STAT EEG and Ativan 2 mg IV were ordered. The patient's last dose of cefepime was on 2/8. Flexeril has been held. LTM overnight was negative.    Recommendations: - D/C LTM.  -Will sign off.    Total of 35 mins spent reviewing chart, discussion with patient on prognosis, Dx and plan. Discussed case with patient's nurse. Reviewed Imaging personally.   If 7pm- 7am, please page neurology on call as listed in [facility].

## 2021-07-09 NOTE — Progress Notes (Signed)
Physical Therapy Treatment Patient Details Name: Brian [name] MRN: [id number] DOB: 1961-02-14 Today's Date: 07/09/2021   History of Present Illness Pt is a 61 y/o male admitted 06/29/21 following C3-7 ACDF after progressive cervical myelopathy symptoms with subsequent fall and severe incomplete SCI. No significant PMH. 2/8: Status post reexploration of anterior cervical fusion and removal of epidural hematoma.    PT Comments    Patient is able to move his left LE against gravity but his R LE can not. He stood with max assist +2. He performed exercises in the bed where he was able to contract his core, gluts, quads, and hamstring.  He required max assist with exercises and did not have fine motor control of LE movements. During treatment session patient showed deficits in strength, endurance, activity tolerance. Recommending therapy services at skilled nursing system to address the previously stated deficits despite not having coverage for it. Will continue to follow acutely to maximize functional mobility, independence and safety.    Recommendations for follow up therapy are one component of a multi-disciplinary discharge planning process, led by the attending physician.  Recommendations may be updated based on patient status, additional functional criteria and insurance authorization.  Follow Up Recommendations  Skilled nursing-short term rehab (<3 hours/day)     Assistance Recommended at Discharge Frequent or constant Supervision/Assistance  Patient can return home with the following A lot of help with walking and/or transfers   Equipment Recommendations       Recommendations for Other Services Rehab consult     Precautions / Restrictions Precautions Precautions: Fall;Cervical Required Braces or Orthoses: Cervical Brace Cervical Brace: Soft collar;At all times Restrictions Weight Bearing Restrictions: No     Mobility  Bed Mobility Overal bed mobility: Needs Assistance Bed  Mobility: Sit to Sidelying, Rolling Rolling: Mod assist       Sit to sidelying: Mod assist, +2 for physical assistance General bed mobility comments: Pt required max assist +2 to get back into bed to manage extremities and facilitate trunk movement.    Transfers Overall transfer level: Needs assistance Equipment used:  (Standing with EVA walker was attempted but was not successfully utilized.) Transfers: Sit to/from Stand, Bed to chair/wheelchair/BSC Sit to Stand: Max assist, +2 physical assistance     Squat pivot transfers: Total assist     General transfer comment: Patient required Max assist +2 to stand from recliner and total assist to squat pivot back into the bed.    Ambulation/Gait                   Stairs             Wheelchair Mobility    Modified Rankin (Stroke Patients Only)       Balance Overall balance assessment: Needs assistance Sitting-balance support: Feet supported Sitting balance-Leahy Scale: Poor Sitting balance - Comments: Patient was unable to maintain sitting balance without mod assistance. He was able to initiate anterior/posterior leaning and Left/Right lean but require mod assist for trunk movement. Postural control: Posterior lean   Standing balance-Leahy Scale: Zero Standing balance comment: Pt was completely relient on PT +2 for standing balance. He required Cuing to maintain upright posture.                            Cognition Arousal/Alertness: Awake/alert Behavior During Therapy: Agitated Overall Cognitive Status: Impaired/Different from baseline Area of Impairment: Safety/judgement, Memory, Awareness  Memory: Decreased short-term memory, Decreased recall of precautions   Safety/Judgement: Decreased awareness of deficits     General Comments: Pt was not oriented to the situation, he was agitated towards the nurse who was administering meds. He was complaining that he was being  restraied to the chair. Pt was educated that he was not restraided to the chair and that his neck brace was protecting his surgery.        Exercises Other Exercises Other Exercises: Heelsides, hip abduction, glute sets, and trunk rotations in bed with mod to max asssist. Other Exercises: Long arc quad and hip flexion performed in the chair with mod to max assist    General Comments        Pertinent Vitals/Pain Pain Assessment Pain Assessment: Faces Faces Pain Scale: Hurts a little bit Pain Location: neck and back Pain Descriptors / Indicators: Aching, Grimacing    Home Living                          Prior Function            PT Goals (current goals can now be found in the care plan section)      Frequency    Min 5X/week      PT Plan Discharge plan needs to be updated    Co-evaluation              AM-PAC PT "6 Clicks" Mobility   Outcome Measure  Help needed turning from your back to your side while in a flat bed without using bedrails?: A Lot Help needed moving from lying on your back to sitting on the side of a flat bed without using bedrails?: A Lot   Help needed standing up from a chair using your arms (e.g., wheelchair or bedside chair)?: Total Help needed to walk in hospital room?: Total Help needed climbing 3-5 steps with a railing? : Total 6 Click Score: 7    End of Session Equipment Utilized During Treatment: Gait belt;Cervical collar Activity Tolerance: Patient tolerated treatment well Patient left: with call bell/phone within reach;in bed Nurse Communication: Mobility status PT Visit Diagnosis: Unsteadiness on feet (R26.81);History of falling (Z91.81);Ataxic gait (R26.0);Muscle weakness (generalized) (M62.81);Other symptoms and signs involving the nervous system (R29.898);Difficulty in walking, not elsewhere classified (R26.2)     Time: 1340-1410 PT Time Calculation (min) (ACUTE ONLY): 30 min  Charges:  $Therapeutic Activity:  23-37 mins                     Quenton Fetter, SPT    Quenton Fetter 07/09/2021, 4:56 PM

## 2021-07-09 NOTE — Progress Notes (Signed)
Patient remains somewhat confused.  Mild agitation.  Denies pain.  Denies shortness of breath.  Remains significantly tachycardic.  Moderately hypotensive.  Urine output good.  Awake and aware.  We will follow commands bilaterally.  Still with 4-/5 strength in his right upper extremity and 4/5 strength in his right lower extremity with 4+/5 strength in his left upper and lower extremity.  His wound is clean and dry.  His neck is soft.  There is no evidence of infection.  Overall progressing slowly.  Patient with severe cervical myelopathy status post decompressive surgery complicated by postoperative epidural hemorrhage.  Patient with history of significant alcohol use and perhaps has signs of alcohol withdrawal as a mechanism with regard to his tachycardia and hypertension with confusion.  We will continue with his benzodiazepines and add Lopressor to treat his tachycardia.  Continue efforts at rehab.

## 2021-07-09 NOTE — Procedures (Signed)
EEG Procedure CPT/Type of Study: [id number]; 24hr EEG with video Referring Provider: Pool Primary Neurological Diagnosis: spells  History: This is a 61 yr old patient, undergoing an EEG to evaluate for spells of altered awareness. Clinical State: disoriented  Technical Description:  The EEG was performed using standard setting per the guidelines of American Clinical Neurophysiology Society (ACNS).  A minimum of 21 electrodes were placed on scalp according to the International 10-20 or/and 10-10 Systems. Supplemental electrodes were placed as needed. Single EKG electrode was also used to detect cardiac arrhythmia. Patient's behavior was continuously recorded on video simultaneously with EEG. A minimum of 16 channels were used for data display. Each epoch of study was reviewed manually daily and as needed using standard referential and bipolar montages. Computerized quantitative EEG analysis (such as compressed spectral array analysis, trending, automated spike & seizure detection) were used as indicated.   Day 1: from Calcium 07/08/21 to 0730 07/09/21  EEG Description: Overall Amplitude:Normal Predominant Frequency: The background activity showed posterior dominant alpha, with about 7-8 Hz, that was rare. Superimposed Frequencies: frequent theta and some delta activity bilaterally The background was symmetric  Background Abnormalities: Generalized slowing as above Rhythmic or periodic pattern: No Epileptiform activity: no Electrographic seizures: no Events: no   Breach rhythm: no  Reactivity: Present  Stimulation procedures:  Hyperventilation: not done Photic stimulation: not done  Sleep Background: Stage II  EKG:no significant arrhythmia  Impression: This was an abnormal continuous video EEG due to generalized slowing, indicative of a non-specific encephalopathy pattern. There were no seizures or epileptiform discharges.

## 2021-07-09 NOTE — Progress Notes (Signed)
LTM EEG discontinued - no skin breakdown at unhook.   

## 2021-07-09 NOTE — Progress Notes (Addendum)
PROGRESS NOTE  Brian [name] JXB:[id number] DOB: [birth year] DOA: 06/29/2021 PCP: Pcp, No  HPI/Recap of past 2524 hours:  61 year old male with prior history of tobacco abuse and ETOH use who recently was found to have critical multilevel cervical spinal stenosis C3-6 with spinal cord compression and spinal cord signal change after recent fall with progressive upper and lower extremity weakness and tremors.  He was admitted on 2/3 and underwent ACDF of C3-4, C4-5, C5-5, and C6-7.  Post-operatively he was progressing slowly with residual weakness in both hands but improving lower extremity strength and function.  He developed some difficulty swallowing on 2/5 and SLP ordered and placed on thickened liquids.   Overnight 2/6, patient with increased difficulty swallowing and was made NPO but also noted to have dysarthria and difficulty moving extremities with numbness.  SLP evaluated and found to be moderate aspiration risk.   Also progressively tachycardic, tachypneic, and now febrile.  Code stroke activated and taken for CT/ CTA head and neck and was given decadron 10mg  once.  NIHSS 18.  CTH was negative for acute findings, and CTA head neck did not reveal any LVO but noted significant prevertebral soft tissue swelling with foci of gas present at the ventral epidural space at C4-5, small collection not excluded.  On 2/7, patient with worsening confusion and concern for airway involvement, PCCM consulted for further evaluation.  Patient underwent exploration of a cervical fusion with evacuation of the epidural hematoma on 07/03/2021.  Culture was placed on 07/04/2021 and removed on 07/06/2021.  CIWA protocol started on 07/07/2021 by primary team due to concern for alcohol withdrawal.  Hospital course complicated by delirium, likely in the setting of acute illness and recent surgery.  Work-up for acute metabolic encephalopathy unrevealing thus far.  No evidence of seizures on overnight video EEG 2/13.  Appreciate  neurology's input.  07/09/2021: Patient was seen and examined at his bedside.  He is alert and oriented x3.  Denies having any pain.  Reported confusion overnight with agitation.  His confusion waxes and wanes.  Started on nonpharmacological and pharmacological delirium management.  Cleared by speech therapist, restarted diet dysphagia 2, finely chopped, nectar thick liquid.    Assessment/Plan: Principal Problem:   Cervical myelopathy (HCC) Active Problems:   Malnutrition of moderate degree  Resolved sepsis secondary to aspiration pneumonia  Unasyn DC'd on 07/07/2021. Leukocytosis has resolved on 07/09/2021.  Afebrile. Continue aspiration precautions.  Acute metabolic encephalopathy/delirium in the setting of recent surgery, acute illness. Work-up thus far unrevealing. CT head, MRI brain, nonacute. TSH, vitamin B12, unrevealing. Ammonia level normal. Overnight video EEG no evidence of seizure activity Delirium precautions, pharmacological and non-pharmacological delirium management. Reorient frequently Out of bed to chair with fall precautions during the day. Regulate sleep and wake cycle, open blinds during the day, melatonin nightly. Seroquel 12.5 mg twice daily, wean off as tolerated. Continue to avoid sedative agents   Severe cervical spinal stenosis with spinal cord compression s/p ACDF C3-C7 on 2/3 c/b epidural hematoma s/p evacuation 2/7 Management per primary team  Sinus tachycardia TSH within normal limit 4.0. Personally reviewed her EKG done on 07/09/2021 showing sinus tachycardia with a rate of 132 QTc 450 Started Lopressor 12.5 mg twice daily. Continue to closely monitor on telemetry.  Improved loose stools, afebrile Monitor for now Less likely infective with no leukocytosis and no fever.  Resolved elevated lactic acid, suspect contributed by dehydration Ongoing gentle IV fluid hydration LR 50 cc/h x 2 days Lactic acid  has normalized.  Questionable alcohol  withdrawal Out of window for withdrawal Continue multivitamins, thiamine, folic acid supplements. Hold off benzodiazepine to avoid worsening of delirium.   Daily tobacco use  Tobacco cessation education when appropriate   Physical debility Continue fall precautions PT OT assessment recommending SNF CSW assisting with SNF placement.  Highly appreciated.  Severe protein calorie malnutrition BMI 18 Severe muscle mass loss Encourage increase in oral protein calorie intake.  Resolved hyperglycemia likely secondary to ongoing steroid use Hemoglobin A1c 5.2 on 07/05/2021. Avoid hypoglycemia   Critical care time: 65 minutes.   Code Status: Full code  Family Communication: Updated his son via phone on 07/07/2021.  Disposition Plan: Likely will discharge to SNF.  Thank you for allowing [place] to participate in the care of this patient.  We will continue to follow along with you.      Objective: Vitals:   07/08/21 1957 07/09/21 0000 07/09/21 0500 07/09/21 0700  BP: (!) 154/97 (!) 165/98  (!) 141/99  Pulse: (!) 129 (!) 120    Resp:    18  Temp: 98.8 F (37.1 C) 98.9 F (37.2 C)  98.3 F (36.8 C)  TempSrc: Axillary Oral  Oral  SpO2: 96%     Weight:   59.3 kg   Height:        Intake/Output Summary (Last 24 hours) at 07/09/2021 1136 Last data filed at 07/09/2021 0517 Gross per 24 hour  Intake 3 ml  Output 1300 ml  Net -1297 ml   Filed Weights   07/07/21 0500 07/08/21 0500 07/09/21 0500  Weight: 59.3 kg 59.3 kg 59.3 kg    Exam:  General: 61 y.o. year-old male frail-appearing in no acute distress.  He is alert and oriented x3.   Cardiovascular: Regular rate and rhythm no rubs or gallops.   Respiratory: Clear to auscultation no wheezes or rales.   Abdomen: Soft monitor normal bowel sounds present. Musculoskeletal: No lower extremity edema bilaterally.   Skin: No ulcerative lesions noted. Psychiatry: Mood is appropriate for condition and setting. Neuro: Nonfocal  exam.    .   Data Reviewed: CBC: Recent Labs  Lab 07/06/21 0346 07/07/21 0312 07/07/21 1913 07/08/21 0454 07/09/21 0649  WBC 15.3* 18.0* 13.4* 11.0* 9.6  NEUTROABS  --   --  9.4*  --   --   HGB 9.8* 10.7* 11.0* 10.8* 10.6*  HCT 28.7* 31.6* 31.9* 31.8* 30.6*  MCV 96.6 97.2 97.0 97.0 97.5  PLT 340 427* 414* 418* [id number]*   Basic Metabolic Panel: Recent Labs  Lab 07/06/21 0346 07/07/21 0312 07/07/21 1913 07/08/21 0454 07/09/21 0649  NA 134* 135 133* 131* 130*  K 3.8 3.3* 3.7 3.6 3.9  CL 100 100 101 98 97*  CO2 24 24 23 23 22   GLUCOSE 121* 110* 100* 95 92  BUN 21* 19 15 17 16   CREATININE 0.67 0.79 0.70 0.76 0.81  CALCIUM 9.5 9.7 9.4 9.5 9.5  MG  --   --  1.8  --   --   PHOS  --   --  3.0  --   --    GFR: Estimated Creatinine Clearance: 81.3 mL/min (by C-G formula based on SCr of 0.81 mg/dL). Liver Function Tests: Recent Labs  Lab 07/07/21 1913 07/08/21 0454 07/09/21 0649  AST 157* 153* 102*  ALT 209* 213* 189*  ALKPHOS 98 97 95  BILITOT 1.1 1.0 1.2  PROT 8.0 7.7 7.8  ALBUMIN 2.9* 2.8* 2.9*   No results for input(s): LIPASE,  AMYLASE in the last 168 hours. Recent Labs  Lab 07/07/21 1913  AMMONIA 34   Coagulation Profile: No results for input(s): INR, PROTIME in the last 168 hours. Cardiac Enzymes: No results for input(s): CKTOTAL, CKMB, CKMBINDEX, TROPONINI in the last 168 hours. BNP (last 3 results) No results for input(s): PROBNP in the last 8760 hours. HbA1C: No results for input(s): HGBA1C in the last 72 hours.  CBG: Recent Labs  Lab 07/06/21 2315 07/07/21 0308 07/07/21 0809 07/08/21 2351 07/09/21 0752  GLUCAP 103* 120* 89 99 95   Lipid Profile: No results for input(s): CHOL, HDL, LDLCALC, TRIG, CHOLHDL, LDLDIRECT in the last 72 hours. Thyroid Function Tests: Recent Labs    07/07/21 1913  TSH 4.004   Anemia Panel: Recent Labs    07/07/21 1913  VITAMINB12 1,328*   Urine analysis:    Component Value Date/Time   COLORURINE  YELLOW 07/03/2021 1110   APPEARANCEUR CLEAR 07/03/2021 1110   LABSPEC 1.010 07/03/2021 1110   PHURINE 6.0 07/03/2021 1110   GLUCOSEU NEGATIVE 07/03/2021 1110   HGBUR TRACE (A) 07/03/2021 1110   BILIRUBINUR NEGATIVE 07/03/2021 1110   KETONESUR >80 (A) 07/03/2021 1110   PROTEINUR NEGATIVE 07/03/2021 1110   NITRITE NEGATIVE 07/03/2021 1110   LEUKOCYTESUR NEGATIVE 07/03/2021 1110   Sepsis Labs: @LABRCNTIP (procalcitonin:4,lacticidven:4)  ) Recent Results (from the past 240 hour(s))  Urine Culture     Status: None   Collection Time: 07/03/21 11:10 AM   Specimen: Urine, Catheterized  Result Value Ref Range Status   Specimen Description URINE, CATHETERIZED  Final   Special Requests NONE  Final   Culture   Final    NO GROWTH Performed at [facility], [place] [address], [place], [place] [address]    Report Status 07/04/2021 FINAL  Final  MRSA Next Gen by PCR, Nasal     Status: None   Collection Time: 07/03/21 12:29 PM   Specimen: Nasal Mucosa; Nasal Swab  Result Value Ref Range Status   MRSA by PCR Next Gen NOT DETECTED NOT DETECTED Final    Comment: (NOTE) The GeneXpert MRSA Assay (FDA approved for NASAL specimens only), is one component of a comprehensive MRSA colonization surveillance program. It is not intended to diagnose MRSA infection nor to guide or monitor treatment for MRSA infections. Test performance is not FDA approved in patients less than 83 years old. Performed at [facility], [place] [address], [place], [place] [address]   Culture, blood (routine x 2)     Status: None   Collection Time: 07/03/21 12:53 PM   Specimen: BLOOD RIGHT HAND  Result Value Ref Range Status   Specimen Description BLOOD RIGHT HAND  Final   Special Requests   Final    BOTTLES DRAWN AEROBIC AND ANAEROBIC Blood Culture adequate volume   Culture   Final    NO GROWTH 5 DAYS Performed at [facility], [place] [address], [place], [place] [address]    Report Status 07/08/2021 FINAL   Final  Culture, blood (routine x 2)     Status: None   Collection Time: 07/03/21  1:08 PM   Specimen: BLOOD LEFT HAND  Result Value Ref Range Status   Specimen Description BLOOD LEFT HAND  Final   Special Requests   Final    BOTTLES DRAWN AEROBIC AND ANAEROBIC Blood Culture adequate volume   Culture   Final    NO GROWTH 5 DAYS Performed at [facility], [place],  [place] [address]    Report Status 07/08/2021 FINAL  Final      Studies: Overnight EEG with video  Result Date: 07/09/2021 Samuella Cota, MD     07/09/2021  8:38 AM EEG Procedure CPT/Type of Study: [id number]; 24hr EEG with video Referring Provider: Pool Primary Neurological Diagnosis: spells History: This is a 61 yr old patient, undergoing an EEG to evaluate for spells of altered awareness. Clinical State: disoriented Technical Description: The EEG was performed using standard setting per the guidelines of American Clinical Neurophysiology Society (ACNS). A minimum of 21 electrodes were placed on scalp according to the International 10-20 or/and 10-10 Systems. Supplemental electrodes were placed as needed. Single EKG electrode was also used to detect cardiac arrhythmia. Patient's behavior was continuously recorded on video simultaneously with EEG. A minimum of 16 channels were used for data display. Each epoch of study was reviewed manually daily and as needed using standard referential and bipolar montages. Computerized quantitative EEG analysis (such as compressed spectral array analysis, trending, automated spike & seizure detection) were used as indicated. Day 1: from [place] 07/08/21 to 0730 07/09/21 EEG Description: Overall Amplitude:Normal Predominant Frequency: The background activity showed posterior dominant alpha, with about 7-8 Hz, that was rare. Superimposed Frequencies: frequent theta and some delta activity bilaterally The background was symmetric Background Abnormalities: Generalized slowing as above Rhythmic or  periodic pattern: No Epileptiform activity: no Electrographic seizures: no Events: no Breach rhythm: no Reactivity: Present Stimulation procedures: Hyperventilation: not done Photic stimulation: not done Sleep Background: Stage II EKG:no significant arrhythmia Impression: This was an abnormal continuous video EEG due to generalized slowing, indicative of a non-specific encephalopathy pattern. There were no seizures or epileptiform discharges.    Scheduled Meds:  Chlorhexidine Gluconate Cloth  6 each Topical Daily   mouth rinse  15 mL Mouth Rinse q12n4p   melatonin  3 mg Oral QHS   metoprolol tartrate  12.5 mg Oral BID   QUEtiapine  12.5 mg Oral BID   senna-docusate  1 tablet Per Tube BID   sodium chloride flush  3 mL Intravenous Q12H   tamsulosin  0.4 mg Oral Daily    Continuous Infusions:  sodium chloride 250 mL (06/29/21 1316)   lactated ringers       LOS: 10 days     Kayleen Memos, MD [facility] Pager [phone]  If 7PM-7AM, please contact night-coverage [email or web address] Password [facility] 07/09/2021, 11:36 AM

## 2021-07-10 ENCOUNTER — Inpatient Hospital Stay (HOSPITAL_COMMUNITY): Payer: 59

## 2021-07-10 DIAGNOSIS — G959 Disease of spinal cord, unspecified: Secondary | ICD-10-CM | POA: Diagnosis not present

## 2021-07-10 LAB — GLUCOSE, CAPILLARY
Glucose-Capillary: 111 mg/dL — ABNORMAL HIGH (ref 70–99)
Glucose-Capillary: 114 mg/dL — ABNORMAL HIGH (ref 70–99)
Glucose-Capillary: 134 mg/dL — ABNORMAL HIGH (ref 70–99)
Glucose-Capillary: 147 mg/dL — ABNORMAL HIGH (ref 70–99)

## 2021-07-10 LAB — CBC
HCT: 31.4 % — ABNORMAL LOW (ref 39.0–52.0)
Hemoglobin: 11 g/dL — ABNORMAL LOW (ref 13.0–17.0)
MCH: 33.7 pg (ref 26.0–34.0)
MCHC: 35 g/dL (ref 30.0–36.0)
MCV: 96.3 fL (ref 80.0–100.0)
Platelets: 501 10*3/uL — ABNORMAL HIGH (ref 150–400)
RBC: 3.26 MIL/uL — ABNORMAL LOW (ref 4.22–5.81)
RDW: 13.7 % (ref 11.5–15.5)
WBC: 7.4 10*3/uL (ref 4.0–10.5)
nRBC: 0 % (ref 0.0–0.2)

## 2021-07-10 LAB — COMPREHENSIVE METABOLIC PANEL
ALT: 154 U/L — ABNORMAL HIGH (ref 0–44)
AST: 85 U/L — ABNORMAL HIGH (ref 15–41)
Albumin: 2.9 g/dL — ABNORMAL LOW (ref 3.5–5.0)
Alkaline Phosphatase: 92 U/L (ref 38–126)
Anion gap: 10 (ref 5–15)
BUN: 22 mg/dL — ABNORMAL HIGH (ref 6–20)
CO2: 23 mmol/L (ref 22–32)
Calcium: 10.3 mg/dL (ref 8.9–10.3)
Chloride: 100 mmol/L (ref 98–111)
Creatinine, Ser: 0.98 mg/dL (ref 0.61–1.24)
GFR, Estimated: >60 mL/min (ref >60)
Glucose, Bld: 109 mg/dL — ABNORMAL HIGH (ref 70–99)
Potassium: 3.9 mmol/L (ref 3.5–5.1)
Sodium: 133 mmol/L — ABNORMAL LOW (ref 135–145)
Total Bilirubin: 0.9 mg/dL (ref 0.3–1.2)
Total Protein: 8 g/dL (ref 6.5–8.1)

## 2021-07-10 LAB — MAGNESIUM: Magnesium: 2.1 mg/dL (ref 1.7–2.4)

## 2021-07-10 LAB — PHOSPHORUS: Phosphorus: 4.3 mg/dL (ref 2.5–4.6)

## 2021-07-10 IMAGING — DX DG CHEST 1V PORT
1 series · 1 of 1 positions shown · non-contrast
Comparison: Chest x-ray [DATE]

CLINICAL DATA: Sinus tachycardia

EXAM:
PORTABLE CHEST 1 VIEW

[chest ap]
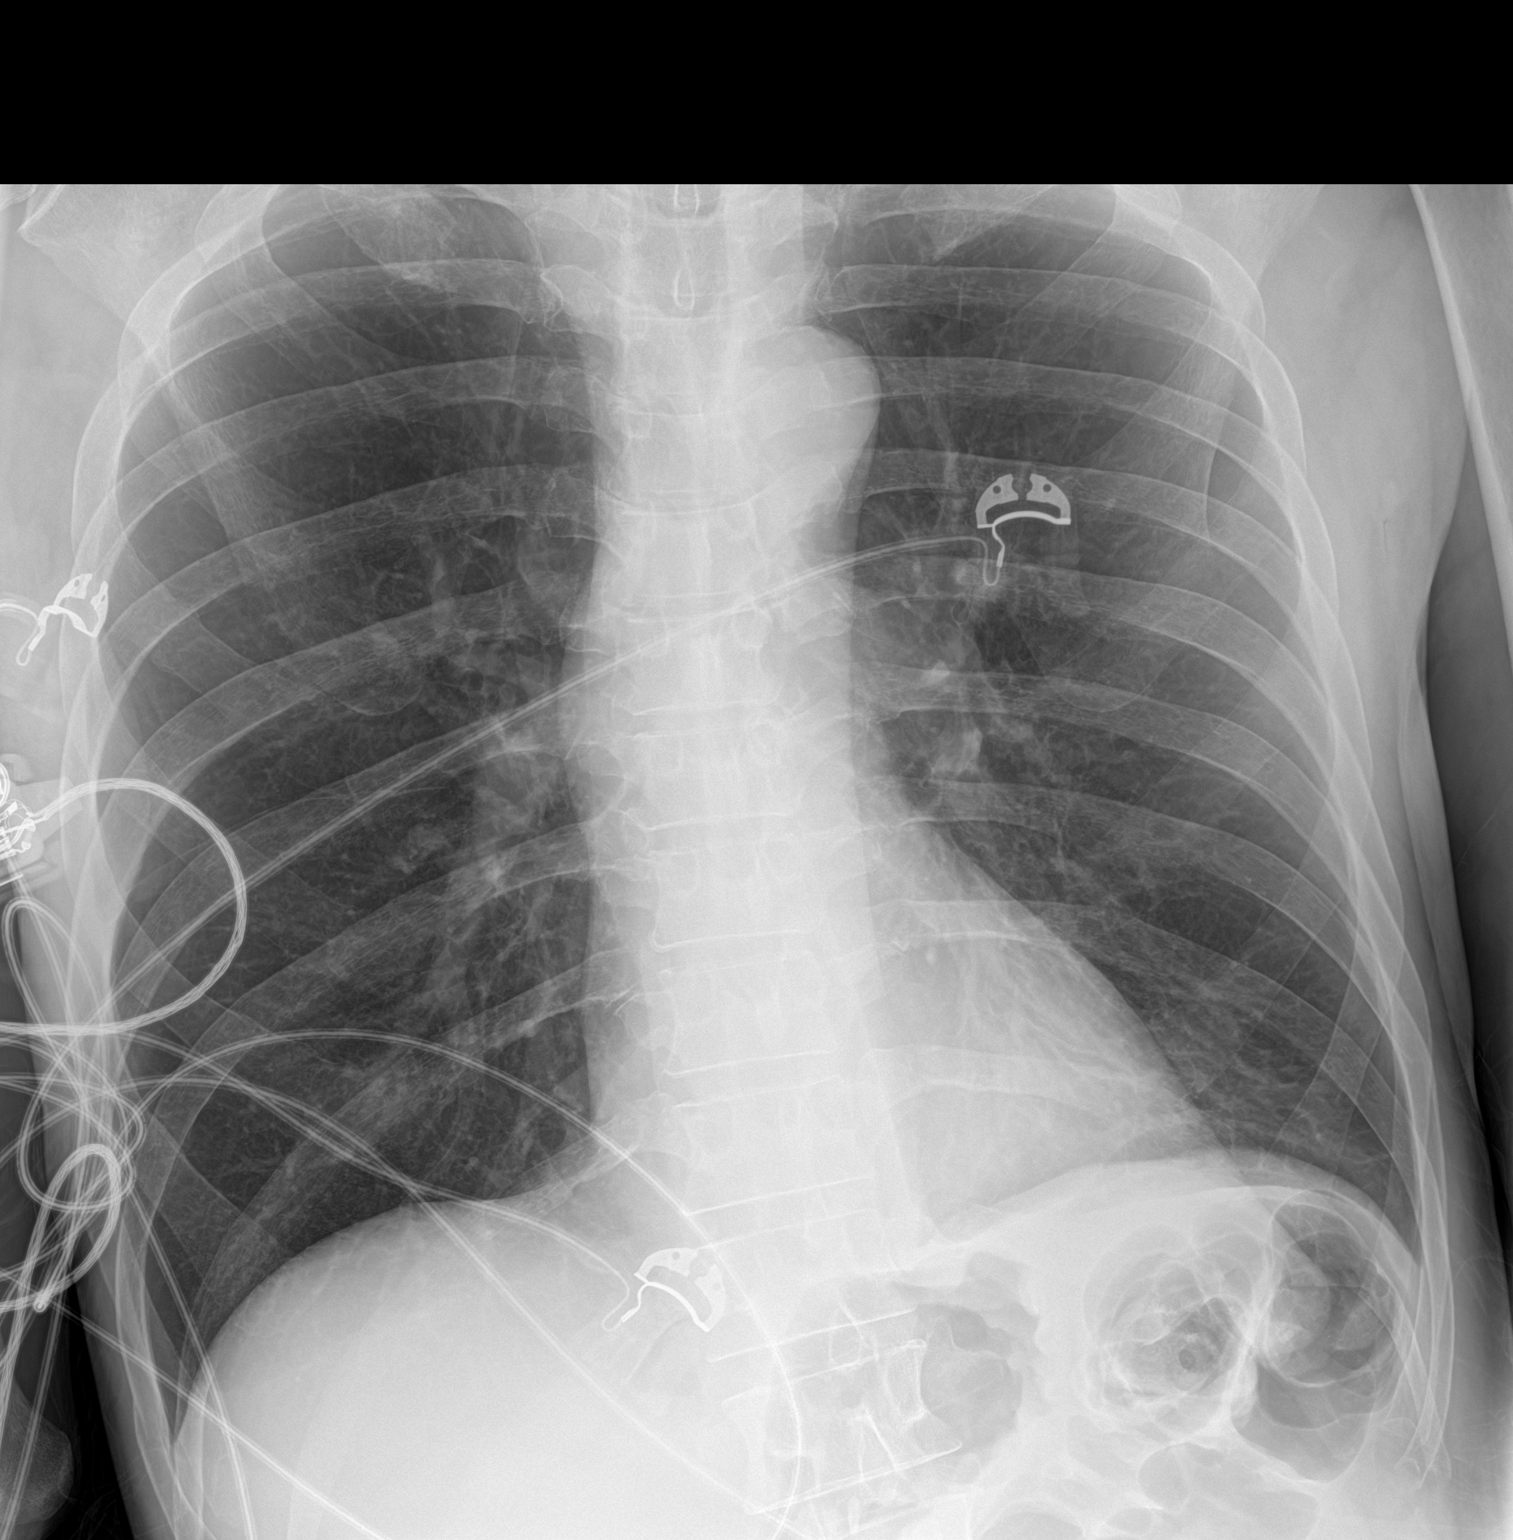

[1 of 1 positions shown; findings below may reference images not displayed]

FINDINGS: Heart size and mediastinal contours are within normal limits. No
suspicious pulmonary opacities identified.

No pleural effusion or pneumothorax visualized.

No acute osseous abnormality appreciated.
IMPRESSION: No acute intrathoracic process identified.

## 2021-07-10 NOTE — Progress Notes (Signed)
PROGRESS NOTE  Brian [name] EQA:[id number] DOB: 1960/09/25 DOA: 06/29/2021 PCP: Pcp, No  HPI/Recap of past 82 hours:  61 year old male with prior history of tobacco abuse and ETOH use who recently was found to have critical multilevel cervical spinal stenosis C3-6 with spinal cord compression and spinal cord signal change after recent fall with progressive upper and lower extremity weakness and tremors.  He was admitted on 2/3 and underwent ACDF of C3-4, C4-5, C5-5, and C6-7.  Post-operatively he was progressing slowly with residual weakness in both hands but improving lower extremity strength and function.  He developed some difficulty swallowing on 2/5 and SLP ordered and placed on thickened liquids.   Overnight 2/6, patient with increased difficulty swallowing and was made NPO but also noted to have dysarthria and difficulty moving extremities with numbness.  SLP evaluated and found to be moderate aspiration risk.   Also progressively tachycardic, tachypneic, and now febrile.  Code stroke activated and taken for CT/ CTA head and neck and was given decadron 10mg  once.  NIHSS 18.  CTH was negative for acute findings, and CTA head neck did not reveal any LVO but noted significant prevertebral soft tissue swelling with foci of gas present at the ventral epidural space at C4-5, small collection not excluded.  On 2/7, patient with worsening confusion and concern for airway involvement, PCCM consulted for further evaluation.  Patient underwent exploration of a cervical fusion with evacuation of the epidural hematoma on 07/03/2021.  Culture was placed on 07/04/2021 and removed on 07/06/2021.  CIWA protocol started on 07/07/2021 by primary team due to concern for alcohol withdrawal.  Hospital course complicated by delirium, likely in the setting of acute illness and recent surgery.  Work-up for acute metabolic encephalopathy was unrevealing.  No evidence of seizures on overnight video EEG 2/13, neurology signed  off.  Due to concern for delirium in the setting of recent neurosurgery, acute illness, patient was started on nonpharmacological and pharmacological management of delirium.  Cleared by speech therapist, restarted diet dysphagia 2, finely chopped, nectar thick liquid on 07/09/2021.    07/10/2021: Patient was seen at his bedside.  He is alert and oriented x4.  Has no new complaints.  He wants to walk.  Seen by PT with recommendation for SNF.   Assessment/Plan: Principal Problem:   Cervical myelopathy (HCC) Active Problems:   Malnutrition of moderate degree  Resolved sepsis secondary to aspiration pneumonia in the setting of dysphagia Unasyn DC'd on 07/07/2021. Leukocytosis has resolved on 07/09/2021.  Afebrile. Continue aspiration precautions.  Resolved acute metabolic encephalopathy/delirium in the setting of recent surgery, acute illness. CT head, MRI brain, nonacute. TSH, vitamin B12, unrevealing. Ammonia level normal. Overnight video EEG no evidence of seizure activity, neurology signed off 2/13. Delirium precautions, pharmacological and non-pharmacological delirium management in place. Continue to reorient frequently Continue out of bed to chair with fall precautions during the day. Continue to regulate sleep and wake cycle, open blinds during the day, melatonin nightly. Continue Seroquel 12.5 mg twice daily, wean off as tolerated. Continue to avoid sedative agents, continue to avoid benzodiazepine.   Severe cervical spinal stenosis with spinal cord compression s/p ACDF C3-C7 on 2/3 c/b epidural hematoma s/p evacuation 2/7 Management per primary team  Improving sinus tachycardia TSH within normal limit 4.0. Personally reviewed her EKG done on 07/09/2021 showing sinus tachycardia with a rate of 132 QTc 450 Heart rate is downtrending on Lopressor. Chest x-ray done on 07/10/2021: Personally reviewed showing no acute cardiopulmonary  disease. Continue Lopressor 12.5 mg twice  daily. Continue to closely monitor on telemetry.  Improved loose stools, afebrile Monitor for now Less likely infective with no leukocytosis and no fever.  Resolved elevated lactic acid, suspect contributed by dehydration Lactic acid has normalized.  Questionable alcohol withdrawal Out of window for withdrawal Continue multivitamins, thiamine, folic acid supplements. Avoid benzodiazepine to avoid worsening of delirium.   Daily tobacco use  Tobacco cessation education when appropriate   Physical debility Continue fall precautions PT OT assessment recommending SNF CSW assisting with SNF placement.  Highly appreciated.  Severe protein calorie malnutrition BMI 18 Severe muscle mass loss Encourage increase in oral protein calorie intake.  Resolved hyperglycemia likely secondary to ongoing steroid use Hemoglobin A1c 5.2 on 07/05/2021. Continue to avoid hypoglycemia      Code Status: Full code  Family Communication: Updated his son in person on 07/09/2021.  Disposition Plan: Likely will discharge to SNF.    Thank you for allowing us to participate in the care of this patient.  We will continue to follow along with you.      Objective: Vitals:   07/10/21 0419 07/10/21 0500 07/10/21 0732 07/10/21 0741  BP: (!) 122/93  104/90   Pulse: (!) 120  (!) 116   Resp: 17  18 12   Temp: 98.6 F (37 C)  98.1 F (36.7 C)   TempSrc: Oral     SpO2: 94%  99%   Weight:  59.3 kg    Height:        Intake/Output Summary (Last 24 hours) at 07/10/2021 1436 Last data filed at 07/09/2021 2204 Gross per 24 hour  Intake 123.44 ml  Output 1000 ml  Net -876.56 ml   Filed Weights   07/08/21 0500 07/09/21 0500 07/10/21 0500  Weight: 59.3 kg 59.3 kg 59.3 kg    Exam:  General: 61 y.o. year-old male frail-appearing in no acute distress.  He is alert and oriented x3.   Cardiovascular: Regular rate and rhythm no rubs or gallops.   Respiratory: Clear to auscultation no wheezes or  rales. Abdomen: Soft nontender normal bowel sounds present.   Musculoskeletal: No lower extremity edema bilaterally.   Skin: No ulcerative lesions noted. Psychiatry: Mood is appropriate for condition and setting. Neuro: Moves all 4 extremities.  Nonfocal exam.    .   Data Reviewed: CBC: Recent Labs  Lab 07/07/21 0312 07/07/21 1913 07/08/21 0454 07/09/21 0649 07/10/21 0603  WBC 18.0* 13.4* 11.0* 9.6 7.4  NEUTROABS  --  9.4*  --   --   --   HGB 10.7* 11.0* 10.8* 10.6* 11.0*  HCT 31.6* 31.9* 31.8* 30.6* 31.4*  MCV 97.2 97.0 97.0 97.5 96.3  PLT 427* 414* 418* 470* 501*   Basic Metabolic Panel: Recent Labs  Lab 07/07/21 0312 07/07/21 1913 07/08/21 0454 07/09/21 0649 07/10/21 0603  NA 135 133* 131* 130* 133*  K 3.3* 3.7 3.6 3.9 3.9  CL 100 101 98 97* 100  CO2 24 23 23 22 23   GLUCOSE 110* 100* 95 92 109*  BUN 19 15 17 16  22*  CREATININE 0.79 0.70 0.76 0.81 0.98  CALCIUM 9.7 9.4 9.5 9.5 10.3  MG  --  1.8  --   --  2.1  PHOS  --  3.0  --   --  4.3   GFR: Estimated Creatinine Clearance: 67.2 mL/min (by C-G formula based on SCr of 0.98 mg/dL). Liver Function Tests: Recent Labs  Lab 07/07/21 1913 07/08/21 0454 07/09/21 0649 07/10/21  0603  AST 157* 153* 102* 85*  ALT 209* 213* 189* 154*  ALKPHOS 98 97 95 92  BILITOT 1.1 1.0 1.2 0.9  PROT 8.0 7.7 7.8 8.0  ALBUMIN 2.9* 2.8* 2.9* 2.9*   No results for input(s): LIPASE, AMYLASE in the last 168 hours. Recent Labs  Lab 07/07/21 1913  AMMONIA 34   Coagulation Profile: No results for input(s): INR, PROTIME in the last 168 hours. Cardiac Enzymes: No results for input(s): CKTOTAL, CKMB, CKMBINDEX, TROPONINI in the last 168 hours. BNP (last 3 results) No results for input(s): PROBNP in the last 8760 hours. HbA1C: No results for input(s): HGBA1C in the last 72 hours.  CBG: Recent Labs  Lab 07/09/21 1141 07/09/21 1605 07/09/21 2148 07/10/21 0732 07/10/21 1214  GLUCAP 134* 131* 136* 114* 147*   Lipid  Profile: No results for input(s): CHOL, HDL, LDLCALC, TRIG, CHOLHDL, LDLDIRECT in the last 72 hours. Thyroid Function Tests: Recent Labs    07/07/21 1913  TSH 4.004   Anemia Panel: Recent Labs    07/07/21 1913  VITAMINB12 1,328*   Urine analysis:    Component Value Date/Time   COLORURINE YELLOW 07/03/2021 1110   APPEARANCEUR CLEAR 07/03/2021 1110   LABSPEC 1.010 07/03/2021 1110   PHURINE 6.0 07/03/2021 1110   GLUCOSEU NEGATIVE 07/03/2021 1110   HGBUR TRACE (A) 07/03/2021 1110   BILIRUBINUR NEGATIVE 07/03/2021 1110   KETONESUR >80 (A) 07/03/2021 1110   PROTEINUR NEGATIVE 07/03/2021 1110   NITRITE NEGATIVE 07/03/2021 1110   LEUKOCYTESUR NEGATIVE 07/03/2021 1110   Sepsis Labs: @LABRCNTIP (procalcitonin:4,lacticidven:4)  ) Recent Results (from the past 240 hour(s))  Urine Culture     Status: None   Collection Time: 07/03/21 11:10 AM   Specimen: Urine, Catheterized  Result Value Ref Range Status   Specimen Description URINE, CATHETERIZED  Final   Special Requests NONE  Final   Culture   Final    NO GROWTH Performed at [facility], [address] [place], [place] [address]    Report Status 07/04/2021 FINAL  Final  MRSA Next Gen by PCR, Nasal     Status: None   Collection Time: 07/03/21 12:29 PM   Specimen: Nasal Mucosa; Nasal Swab  Result Value Ref Range Status   MRSA by PCR Next Gen NOT DETECTED NOT DETECTED Final    Comment: (NOTE) The GeneXpert MRSA Assay (FDA approved for NASAL specimens only), is one component of a comprehensive MRSA colonization surveillance program. It is not intended to diagnose MRSA infection nor to guide or monitor treatment for MRSA infections. Test performance is not FDA approved in patients less than 36 years old. Performed at [facility], [address], [place], [place] [address]   Culture, blood (routine x 2)     Status: None   Collection Time: 07/03/21 12:53 PM   Specimen: BLOOD RIGHT HAND  Result Value Ref Range  Status   Specimen Description BLOOD RIGHT HAND  Final   Special Requests   Final    BOTTLES DRAWN AEROBIC AND ANAEROBIC Blood Culture adequate volume   Culture   Final    NO GROWTH 5 DAYS Performed at [facility], [address] [place], [place] [address]    Report Status 07/08/2021 FINAL  Final  Culture, blood (routine x 2)     Status: None   Collection Time: 07/03/21  1:08 PM   Specimen: BLOOD LEFT HAND  Result Value Ref Range Status   Specimen Description BLOOD LEFT HAND  Final   Special Requests   Final    BOTTLES DRAWN AEROBIC AND ANAEROBIC Blood Culture adequate volume   Culture   Final    NO GROWTH 5 DAYS Performed at [facility], [address] [place], [place] [address]    Report Status 07/08/2021 FINAL  Final      Studies: DG CHEST PORT 1 VIEW  Result Date: 07/10/2021 CLINICAL DATA:  Sinus tachycardia EXAM: PORTABLE CHEST 1 VIEW COMPARISON:  Chest x-ray 07/03/2021 FINDINGS: Heart size and mediastinal contours are within normal limits. No suspicious pulmonary opacities identified. No pleural effusion or pneumothorax visualized. No acute osseous abnormality appreciated. IMPRESSION: No acute intrathoracic process identified. Electronically Signed   By: Jannifer Hick M.D.   On: 07/10/2021 08:31    Scheduled Meds:  Chlorhexidine Gluconate Cloth  6 each Topical Daily   mouth rinse  15 mL Mouth Rinse q12n4p   melatonin  3 mg Oral QHS   metoprolol tartrate  12.5 mg Oral BID   multivitamin with minerals  1 tablet Oral Daily   QUEtiapine  12.5 mg Oral BID   senna-docusate  1 tablet Per Tube BID   sodium chloride flush  3 mL Intravenous Q12H   tamsulosin  0.4 mg Oral Daily    Continuous Infusions:  sodium chloride 250 mL (06/29/21 1316)     LOS: 11 days     Darlin Drop, MD [facility] Pager [phone]  If 7PM-7AM, please contact night-coverage [email or web address] Password [facility] 07/10/2021, 2:36 PM

## 2021-07-10 NOTE — Progress Notes (Signed)
Speech Language Pathology Treatment: Dysphagia  Patient Details Name: Brian [name] MRN: [id number] DOB: 08-14-1960 Today's Date: 07/10/2021 Time: 2330-0762 SLP Time Calculation (min) (ACUTE ONLY): 11 min  Assessment / Plan / Recommendation Clinical Impression  Pt concerned with getting more rehab out of the hospital to get back to work on light duty. Therapist will attempt to notify social worker. Observed with straw sip mighty shake followed by one immediate cough. Shake is thicker than thin but may not have been quite nectar thick. Therapist controlled sip to decrease volume. He just completed eating graham crackers. He fed himself one bite of pudding independently using built up spoon. Therapist will continue Dys 2 texture, nectar thick liquids and ST.   HPI HPI: 61 y/o admitted 06/29/21 following C3-7 ACDF after progressive cervical myelopathy symptoms with subsequent fall and severe incomplete SCI. No significant PMH.Repeat MBS for possible advancement.      SLP Plan  Continue with current plan of care      Recommendations for follow up therapy are one component of a multi-disciplinary discharge planning process, led by the attending physician.  Recommendations may be updated based on patient status, additional functional criteria and insurance authorization.    Recommendations  Diet recommendations: Dysphagia 2 (fine chop);Nectar-thick liquid Liquids provided via: Cup;Straw Medication Administration: Crushed with puree Supervision: Staff to assist with self feeding;Full supervision/cueing for compensatory strategies Compensations: Slow rate;Small sips/bites;Minimize environmental distractions;Clear throat intermittently Postural Changes and/or Swallow Maneuvers: Seated upright 90 degrees                Oral Care Recommendations: Oral care BID Follow Up Recommendations: Skilled nursing-short term rehab (<3 hours/day) Assistance recommended at discharge: Frequent or  constant Supervision/Assistance SLP Visit Diagnosis: Dysphagia, pharyngeal phase (R13.13) Plan: Continue with current plan of care           [name]  07/10/2021, 2:16 PM

## 2021-07-10 NOTE — Progress Notes (Signed)
Occupational Therapy Treatment Patient Details Name: Brian [name] MRN: [id number] DOB: 1961-03-21 Today's Date: 07/10/2021   History of present illness Pt is a 61 y/o male admitted 06/29/21 following C3-7 ACDF after progressive cervical myelopathy symptoms with subsequent fall and severe incomplete SCI. No significant PMH. 2/8: Status post reexploration of anterior cervical fusion and removal of epidural hematoma.   OT comments  Treatment focused on BUE AAROM exercises to increase functional use to assist with self feeding and self care.  Patient participated in AAROM to BUE shoulder, elbow, and hand no complaints of pain. Patient instructed in yellow therapy putty exercises for gross grasp and pinch exercises attempted with patient unable to perform at this time. Patient stated he felt like the exercises were making a difference with his AROM. Acute OT to continue to follow.    Recommendations for follow up therapy are one component of a multi-disciplinary discharge planning process, led by the attending physician.  Recommendations may be updated based on patient status, additional functional criteria and insurance authorization.    Follow Up Recommendations  Skilled nursing-short term rehab (<3 hours/day)    Assistance Recommended at Discharge Frequent or constant Supervision/Assistance  Patient can return home with the following  Two people to help with walking and/or transfers;A lot of help with bathing/dressing/bathroom;Direct supervision/assist for medications management;Assist for transportation;Help with stairs or ramp for entrance;Assistance with feeding   Equipment Recommendations  Wheelchair (measurements OT);Wheelchair cushion (measurements OT);Hospital bed;BSC/3in1;Tub/shower seat    Recommendations for Other Services      Precautions / Restrictions Precautions Precautions: Fall;Cervical Precaution Booklet Issued: Yes (comment) Precaution Comments: Verbally reviewed  precautions during functional mobility. Required Braces or Orthoses: Cervical Brace Cervical Brace: Soft collar;At all times Restrictions Weight Bearing Restrictions: No       Mobility Bed Mobility                    Transfers                         Balance                                           ADL either performed or assessed with clinical judgement   ADL                                              Extremity/Trunk Assessment Upper Extremity Assessment RUE Deficits / Details: shoulder 2-/5 strength; elbow 2-/5 strength, wrist and hand 2 to 2-/5; digits 2-/5 RUE Sensation: decreased light touch RUE Coordination: decreased fine motor;decreased gross motor LUE Deficits / Details: shoulder 2-/5 strength; elbow 2-/5 strength, wrist and hand 2 to 2-/5; digits 2-/5 LUE Sensation: decreased light touch LUE Coordination: decreased fine motor;decreased gross motor            Vision       Perception     Praxis      Cognition Arousal/Alertness: Awake/alert Behavior During Therapy: Flat affect Overall Cognitive Status: Impaired/Different from baseline Area of Impairment: Safety/judgement, Memory, Awareness                     Memory: Decreased short-term memory, Decreased recall of precautions   Safety/Judgement: Decreased awareness of  deficits, Decreased awareness of safety     General Comments: believes UE weakness is due to medication        Exercises Exercises: General Upper Extremity, Other exercises General Exercises - Upper Extremity Shoulder Flexion: AAROM, Both, 10 reps, Seated (to 90 degrees) Shoulder Extension: AAROM, Both, 10 reps, Seated Shoulder ABduction: AAROM, Both, 10 reps, Seated Elbow Flexion: AAROM, Both, 10 reps, Seated Elbow Extension: AAROM, 10 reps, Seated Wrist Flexion: AAROM, Both, 10 reps, Seated Wrist Extension: AAROM, Both, Seated Digit Composite Flexion: AROM,  Both, 10 reps Other Exercises Other Exercises: yellow therapy putty exercises for gross grasp.  Patient unable to perform [facility] exercises with putty    Shoulder Instructions       General Comments      Pertinent Vitals/ Pain       Pain Assessment Pain Assessment: No/denies pain Pain Intervention(s): Monitored during session  Home Living                                          Prior Functioning/Environment              Frequency  Min 2X/week        Progress Toward Goals  OT Goals(current goals can now be found in the care plan section)  Progress towards OT goals: Progressing toward goals  Acute Rehab OT Goals Patient Stated Goal: get better OT Goal Formulation: With patient Time For Goal Achievement: 07/18/21 Potential to Achieve Goals: Fair ADL Goals Pt Will Perform Eating: with min assist;with adaptive utensils;sitting Pt Will Perform Grooming: with min assist;sitting Pt Will Transfer to Toilet: with min assist;stand pivot transfer;bedside commode Pt/caregiver will Perform Home Exercise Program: Increased strength;Both right and left upper extremity;With theraputty;With minimal assist;With written HEP provided  Plan Discharge plan remains appropriate    Co-evaluation                 AM-PAC OT "6 Clicks" Daily Activity     Outcome Measure   Help from another person eating meals?: A Lot Help from another person taking care of personal grooming?: A Lot Help from another person toileting, which includes using toliet, bedpan, or urinal?: Total Help from another person bathing (including washing, rinsing, drying)?: A Lot Help from another person to put on and taking off regular upper body clothing?: A Lot Help from another person to put on and taking off regular lower body clothing?: Total 6 Click Score: 10    End of Session Equipment Utilized During Treatment: Cervical collar  OT Visit Diagnosis: Unsteadiness on feet  (R26.81);Muscle weakness (generalized) (M62.81);Ataxia, unspecified (R27.0);Feeding difficulties (R63.3)   Activity Tolerance Patient tolerated treatment well   Patient Left in chair;with call bell/phone within reach   Nurse Communication Mobility status        Time: 6222-9798 OT Time Calculation (min): 16 min  Charges: OT General Charges $OT Visit: 1 Visit OT Treatments $Therapeutic Exercise: 8-22 mins  Alfonse Flavors, OTA Acute Rehabilitation Services  Pager [phone] Office [phone]   Dewain Penning 07/10/2021, 3:06 PM

## 2021-07-10 NOTE — Progress Notes (Signed)
Looks a little better this morning.  Seems less confused.  Less agitated.  Denies pain.  He is afebrile.  Heart rate is still elevated but improved on low-dose Lopressor.  Blood pressure normal.  Urine output yesterday good.  Quadriparesis unchanged.  Wound clean and dry.  Labs look okay.  White count normal.  Creatinine normal.  Sodium remains a little low but has stabilized.    Patient with profound cervical myelopathy status post multilevel anterior cervical decompression and fusion surgery.  Continue supportive efforts and medical care.

## 2021-07-10 NOTE — Progress Notes (Signed)
Physical Therapy Treatment Patient Details Name: Brian [name] MRN: [id number] DOB: June 23, 1960 Today's Date: 07/10/2021   History of Present Illness Pt is a 61 y/o male admitted 06/29/21 following C3-7 ACDF after progressive cervical myelopathy symptoms with subsequent fall and severe incomplete SCI. No significant PMH. 2/8: Status post reexploration of anterior cervical fusion and removal of epidural hematoma.    PT Comments    Patient continues to display decreased awareness of deficits. He stood with max assist +2 and help from elevated hospital bed. He required max assist with exercises and did not have fine motor control of LE movements. During treatment session patient showed deficits in strength, endurance, activity tolerance. Recommending therapy services at skilled nursing system to address the previously stated deficits despite not having coverage for it. Will continue to follow acutely to maximize functional mobility, independence and safety. If discharged patient with need 24 hour capable, assistance with ADLs, as well as DME noted below.  Recommendations for follow up therapy are one component of a multi-disciplinary discharge planning process, led by the attending physician.  Recommendations may be updated based on patient status, additional functional criteria and insurance authorization.  Follow Up Recommendations  Skilled nursing-short term rehab (<3 hours/day)     Assistance Recommended at Discharge Frequent or constant Supervision/Assistance  Patient can return home with the following Two people to help with walking and/or transfers;Two people to help with bathing/dressing/bathroom;Direct supervision/assist for medications management;Direct supervision/assist for financial management;Assist for transportation;Assistance with feeding;Assistance with cooking/housework;Help with stairs or ramp for entrance   Equipment Recommendations  Wheelchair (measurements PT);Hospital  bed;Wheelchair cushion (measurements PT);BSC/3in1;Other (comment) (hoyer lift)    Recommendations for Other Services       Precautions / Restrictions Precautions Precautions: Fall;Cervical Precaution Comments: Verbally reviewed precautions during functional mobility. Required Braces or Orthoses: Cervical Brace Cervical Brace: Soft collar;At all times Restrictions Weight Bearing Restrictions: No     Mobility  Bed Mobility               General bed mobility comments: pt was in the chair upon entry into the room and was left in chair.    Transfers Overall transfer level: Needs assistance Equipment used: Rolling walker (2 wheels) Transfers: Sit to/from Stand Sit to Stand: Max assist, +2 physical assistance           General transfer comment: Patient required Max assist +2 to stand from recliner.    Ambulation/Gait                   Stairs             Wheelchair Mobility    Modified Rankin (Stroke Patients Only)       Balance Overall balance assessment: Needs assistance Sitting-balance support: Feet supported Sitting balance-Leahy Scale: Poor Sitting balance - Comments: Patient was unable to maintain sitting balance without mod assistance. Postural control: Posterior lean Standing balance support: Bilateral upper extremity supported Standing balance-Leahy Scale: Zero Standing balance comment: Pt was completely relient on PT +2 for standing balance; 1x with walk, 3x at elevated EOB.  We stood at the edge of an elevated bed; bed supported UE and blocked knees from flexing. He was able to shift left to right with max assist +2. He required cuing to maintain upright posture.                            Cognition Arousal/Alertness: Awake/alert Behavior During Therapy: Flat affect Overall  Cognitive Status: Impaired/Different from baseline Area of Impairment: Safety/judgement, Memory, Awareness                     Memory:  Decreased short-term memory, Decreased recall of precautions   Safety/Judgement: Decreased awareness of deficits, Decreased awareness of safety     General Comments: Pt exhibits signs of cognitive impairments and decreased awareness of deficit. Upon entry into the room pt asked if we were going for a walk. He noted at the end of the session that he did not want to take the medication that he was being given because it made his hands close up, refering to his tenodesis.        Exercises Other Exercises Other Exercises: Long arc quad and hip flexion performed in the chair with mod to max assist    General Comments        Pertinent Vitals/Pain Pain Assessment Pain Assessment: No/denies pain    Home Living                          Prior Function            PT Goals (current goals can now be found in the care plan section)      Frequency    Min 5X/week      PT Plan Discharge plan needs to be updated    Co-evaluation              AM-PAC PT "6 Clicks" Mobility   Outcome Measure  Help needed turning from your back to your side while in a flat bed without using bedrails?: A Lot Help needed moving from lying on your back to sitting on the side of a flat bed without using bedrails?: A Lot Help needed moving to and from a bed to a chair (including a wheelchair)?: A Lot Help needed standing up from a chair using your arms (e.g., wheelchair or bedside chair)?: Total Help needed to walk in hospital room?: Total Help needed climbing 3-5 steps with a railing? : Total 6 Click Score: 9    End of Session Equipment Utilized During Treatment: Gait belt;Cervical collar Activity Tolerance: Patient tolerated treatment well Patient left: with call bell/phone within reach;in bed Nurse Communication: Mobility status PT Visit Diagnosis: Unsteadiness on feet (R26.81);History of falling (Z91.81);Ataxic gait (R26.0);Muscle weakness (generalized) (M62.81);Other symptoms and  signs involving the nervous system (R29.898);Difficulty in walking, not elsewhere classified (R26.2)     Time: 0102-7253 PT Time Calculation (min) (ACUTE ONLY): 27 min  Charges:  $Therapeutic Activity: 8-22 mins $Neuromuscular Re-education: 8-22 mins                     Armanda Heritage, SPT    Armanda Heritage 07/10/2021, 11:28 AM

## 2021-07-11 DIAGNOSIS — R5381 Other malaise: Secondary | ICD-10-CM | POA: Diagnosis present

## 2021-07-11 DIAGNOSIS — G9341 Metabolic encephalopathy: Secondary | ICD-10-CM | POA: Diagnosis not present

## 2021-07-11 DIAGNOSIS — G959 Disease of spinal cord, unspecified: Secondary | ICD-10-CM | POA: Diagnosis not present

## 2021-07-11 DIAGNOSIS — J690 Pneumonitis due to inhalation of food and vomit: Secondary | ICD-10-CM | POA: Diagnosis not present

## 2021-07-11 DIAGNOSIS — R739 Hyperglycemia, unspecified: Secondary | ICD-10-CM | POA: Diagnosis not present

## 2021-07-11 DIAGNOSIS — E43 Unspecified severe protein-calorie malnutrition: Secondary | ICD-10-CM

## 2021-07-11 DIAGNOSIS — F17200 Nicotine dependence, unspecified, uncomplicated: Secondary | ICD-10-CM | POA: Diagnosis present

## 2021-07-11 LAB — COMPREHENSIVE METABOLIC PANEL
ALT: 128 U/L — ABNORMAL HIGH (ref 0–44)
AST: 65 U/L — ABNORMAL HIGH (ref 15–41)
Albumin: 2.9 g/dL — ABNORMAL LOW (ref 3.5–5.0)
Alkaline Phosphatase: 93 U/L (ref 38–126)
Anion gap: 8 (ref 5–15)
BUN: 23 mg/dL — ABNORMAL HIGH (ref 6–20)
CO2: 25 mmol/L (ref 22–32)
Calcium: 9.8 mg/dL (ref 8.9–10.3)
Chloride: 99 mmol/L (ref 98–111)
Creatinine, Ser: 1.07 mg/dL (ref 0.61–1.24)
GFR, Estimated: >60 mL/min (ref >60)
Glucose, Bld: 113 mg/dL — ABNORMAL HIGH (ref 70–99)
Potassium: 3.7 mmol/L (ref 3.5–5.1)
Sodium: 132 mmol/L — ABNORMAL LOW (ref 135–145)
Total Bilirubin: 0.8 mg/dL (ref 0.3–1.2)
Total Protein: 7.8 g/dL (ref 6.5–8.1)

## 2021-07-11 LAB — GLUCOSE, CAPILLARY
Glucose-Capillary: 148 mg/dL — ABNORMAL HIGH (ref 70–99)
Glucose-Capillary: 137 mg/dL — ABNORMAL HIGH (ref 70–99)

## 2021-07-11 LAB — CBC
HCT: 32.8 % — ABNORMAL LOW (ref 39.0–52.0)
Hemoglobin: 11.3 g/dL — ABNORMAL LOW (ref 13.0–17.0)
MCH: 33.5 pg (ref 26.0–34.0)
MCHC: 34.5 g/dL (ref 30.0–36.0)
MCV: 97.3 fL (ref 80.0–100.0)
Platelets: 465 10*3/uL — ABNORMAL HIGH (ref 150–400)
RBC: 3.37 MIL/uL — ABNORMAL LOW (ref 4.22–5.81)
RDW: 13.7 % (ref 11.5–15.5)
WBC: 7 10*3/uL (ref 4.0–10.5)
nRBC: 0 % (ref 0.0–0.2)

## 2021-07-11 NOTE — Assessment & Plan Note (Addendum)
-  Significant weakness in all extremities as a result of cervical myelopathy and acute illness.  Slowly improving while inpatient. -Continue PT/OT efforts -Pending SNF placement; difficult placement per TOC and if he improves enough can possibly D/C home, but PT/OT still recommending SNF but he would like to D/C Home

## 2021-07-11 NOTE — Assessment & Plan Note (Addendum)
CT head, MRI brain, EEG, TSH, B12, ammonia unrevealing.   Etiology likely multifactorial with acute respiratory failure sec. to aspiration pneumonia, bilateral pulmonary embolism.   Completed course of antibiotics. --Reduced Seroquel dose to 12.5 mg qHS on 3/13; continue to wean --Reduced clonazepam to 0.25 mg BID on 3/13; continue to wean

## 2021-07-11 NOTE — Progress Notes (Addendum)
07/11/21 1259  What Happened  Was fall witnessed? No  Was patient injured? No  Patient found on floor  Found by Staff-comment Renee Ramus RN)  Stated prior activity other (comment) (Pt attempting to reach table next to bed but it rolled away)  Follow Up  MD notified Dr. Annette Stable  Time MD notified [phone]  Family notified Yes - comment (LM to return call)  Time family notified 1320  Additional tests No  Progress note created (see row info) Yes  Adult Fall Risk Assessment  Risk Factor Category (scoring not indicated) High fall risk per protocol (document High fall risk)  Patient Fall Risk Level High fall risk  Adult Fall Risk Interventions  Required Bundle Interventions *See Row Information* High fall risk - low, moderate, and high requirements implemented  Additional Interventions PT/OT need assessed if change in mobility from baseline;Use of appropriate toileting equipment (bedpan, BSC, etc.)  Screening for Fall Injury Risk (To be completed on HIGH fall risk patients) - Assessing Need for Floor Mats  Risk For Fall Injury- Criteria for Floor Mats None identified - No additional interventions needed  Will Implement Floor Mats Yes  Vitals  Temp 97.8 F (36.6 C)  Temp Source Oral  BP 121/89  MAP (mmHg) 99  BP Location Right Arm  BP Method Automatic  Patient Position (if appropriate) Lying  Pulse Rate Source Monitor  Resp 18  Oxygen Therapy  SpO2 94 %  O2 Device Room Air  Pain Assessment  Pain Scale 0-10  Pain Score 4  Pain Type Acute pain  Pain Location Neck  Pain Descriptors / Indicators Aching  Pain Frequency Constant  Pain Onset On-going  Pain Intervention(s) Repositioned  Neurological  Neuro (WDL) X  Level of Consciousness Alert  Orientation Level Oriented X4  Cognition Impulsive;Poor attention/concentration;Poor judgement;Poor safety awareness;Follows commands  Speech Clear  R Pupil Size (mm) 3  R Pupil Shape Round  R Pupil Reaction Brisk  L Pupil Size (mm)  3  L Pupil Shape Round  L Pupil Reaction Brisk  Motor Function/Sensation Assessment Grip;Dorsiflexion;Plantar flexion;Motor response;Sensation;Motor strength  R Hand Grip Weak  L Hand Grip Moderate  R Foot Dorsiflexion Weak  L Foot Dorsiflexion Moderate  R Foot Plantar Flexion Weak  L Foot Plantar Flexion Moderate  RUE Motor Response Purposeful movement  RUE Sensation Full sensation  RUE Motor Strength 4  LUE Motor Response Purposeful movement  LUE Sensation Full sensation  LUE Motor Strength 4  RLE Motor Response Purposeful movement  RLE Sensation Full sensation  RLE Motor Strength 4  LLE Motor Response Purposeful movement  LLE Sensation Full sensation  LLE Motor Strength 4  Neuro Symptoms Forgetful  Glasgow Coma Scale  Eye Opening 4  Best Verbal Response (NON-intubated) 5  Best Motor Response 6  Musculoskeletal  Musculoskeletal (WDL) X  Assistive Device None  Generalized Weakness Yes  Weight Bearing Restrictions No  Musculoskeletal Details  RUE Weakness  LUE Weakness  RLE Weakness  LLE Weakness  Neck Ortho/Supportive Device  Neck Ortho/Supportive Device Collar  Collar Soft;On and aligned  Integumentary  Integumentary (WDL) X  Skin Color Appropriate for ethnicity  Skin Condition Dry  Skin Integrity Intact (surgical incision)  Skin Turgor Non-tenting   1320: Floor mats placed on each side of bed. Pt educated on importance of pressing call bell for help and fall prevention measures. He verbalized understanding. Given soft touch call bell educated on use. Pt verbalized understanding. Bed in lowest position, call bell within reach, bed alarm  on.

## 2021-07-11 NOTE — Assessment & Plan Note (Addendum)
-  As evidenced by poor p.o. intake, significant muscle mass and subcu fat loss and significant weight loss (about 16 pounds since this hospitalization). Nutrition Problem: Severe Malnutrition (in the context of social/environmental circumstances) Etiology:  (inadequate energy intake) Signs/Symptoms: mild fat depletion, severe muscle depletion, severe muscle depletion Interventions: Refer to RD note for recommendations  --Dietitian following, appreciate assistance.   --s/p IR gtube 3/6.  --Now cleared for regular diet with thin liquids -- Tube feeds held 09/20/2021 as patient with nausea and ongoing emesis.  Nausea and emesis seem to have improved. -Continue boost drinks/nutritional supplementation.   -Patient noted to have accidentally pulled out PEG tube the evening of 09/21/2021. -Calorie count completed 09/24/2021.. -Continue current regular diet with meals. -We will leave out PEG tube for now as patient would prefer not to have it placed back in. -Diet liberalized, seems as if patient does not like the hospital food and family advised could bring in foods which patient likes. -Dietitian consulted and following. -SLP following.

## 2021-07-11 NOTE — Assessment & Plan Note (Addendum)
Patient initially presented s/p recent fall with progressive upper and lower extremity weakness and tremors, He was found to have critical multilevel cervical spinal stenosis @ C3-3 6 with spinal cord compression and spinal cord signal change and underwent ACDF C3-4, C4-5, C5-5, and C6/7 by neurosurgery Dr. Trenton Gammon on 06/29/2021.  Postoperative leg complicated by epidural hematoma s/p evacuation on 2/7. --Continue PT/OT/SLP efforts, Awaiting SNF --Further as per neurosurgery recommendation.

## 2021-07-11 NOTE — Assessment & Plan Note (Deleted)
Completed antibiotic course, and resolved.

## 2021-07-11 NOTE — Hospital Course (Addendum)
Brian [name] is a 61 year old male with past medical history of tobacco and alcohol use was admitted by neurosurgery for cervical myelopathy due to critical multilevel cervical spinal canal stenosis at C3-C6 with spinal cord compression.  TRH on board for medical assistance. Awaiting placement/improvement with mobility to be discharged home.  Assessment and Plan:  Principal Problem:   Cervical myelopathy (Brian [name]); severe cervical spinal stenosis with cord compression s/p ACDF [id number] on 2/3, complicated by epidural hematoma s/p evacuation 2/7 Active Problems:   Acute respiratory failure with hypoxia (HCC)   Acute pulmonary embolism (HCC)   Acute metabolic encephalopathy   Severe sepsis due to recurrent aspiration pneumonia   E. coli bacteremia   Sinus tachycardia   Elevated liver enzymes   Hyponatremia   Tobacco use disorder   Physical deconditioning   Urinary retention   Protein-calorie malnutrition, severe (HCC)   Lumbar back pain   Fall during current hospitalization   Normocytic anemia   Thrombocytosis   Citrobacter infection   Vomiting   Abnormal LFTs   Aspiration pneumonia (HCC)  Cervical myelopathy (HCC); severe cervical spinal stenosis with cord compression s/p ACDF [id number] on 2/3, complicated by epidural hematoma s/p evacuation 2/7 - awaiting further improvement in mobility as patient lives alone   Right hand pain Continue gabapentin and brace.  Acute respiratory failure with hypoxia ([place]) - resolved  Had recurrent aspiration pneumonia during hospitalization.  Was intubated and successfully extubated during hospitalization.  Has completed course of antibiotic for aspiration pneumonia.  Currently on room air.  Appears stable at this time.  Acute pulmonary embolism (HCC) CTA chest (2/7) showed bilateral segmental PE. LE [place] negative for DVT.  TTE with normal LV systolic function.   - Continue Eliquis for anticoagulation..  Acute metabolic encephalopathy Improved and at  baseline.  CT head, MRI brain, EEG, TSH, B12, ammonia was unremarkable.   E. coli bacteremia - resolved  Resolved, urine culture and blood cultures x2 08/17/2021 positive for E. coli. Completed 7-day course of Ceftriaxone > Cefazolin.  No urinary issues at this time  Severe sepsis due to recurrent aspiration pneumonia - resolved  Completed course of IV Zosyn  Sinus tachycardia, resolved Thought to be to be multifactorial secondary to dehydration sepsis, pulmonary embolism.  On metoprolol.  Elevated liver enzymes Resolved at this time.  Hepatitis panel was negative.  Right upper quadrant ultrasound was unremarkable,  Hyponatremia - resolved   Tobacco use disorder Off nicotine patch.  Physical deconditioning Significant weakness in all extremities as a result of cervical myelopathy and acute illness.  Continue physical therapy, occupational therapy  Urinary retention Was initially on Foley catheter.  Currently able to urinate by himself.  Continue Flomax and finasteride.    Severe protein-calorie malnutrition, Continue with dietary supplements.  Thrombocytosis Has resolved.  Latest platelet count of 294.    Normocytic anemia Chronic and stable.  Patient received 1 PRBC during hospitalization.  Latest hemoglobin of 10.2  Hyperglycemia-resolved as of 07/12/2021. Likely due to Steroids. Hemoglobin A1C of 5.2%. Resolved.

## 2021-07-11 NOTE — Assessment & Plan Note (Addendum)
Encouraged cessation. --Nicotine patch; decreased to 14 mg on 3/8

## 2021-07-11 NOTE — Progress Notes (Signed)
Continues to seem much less restless and anxious.  Confusion appears to have resolved.  He is afebrile.  His heart rate has normalized.  Blood pressure a little soft.  Urine output marginal yesterday but labs still good.  Awake and alert.  Does not appear to be uncomfortable.  Wound clean and dry.  Motor and sensory function stable.  Continue supportive efforts at rehab.  Watch fluid status.

## 2021-07-11 NOTE — Assessment & Plan Note (Addendum)
-  Likely due to Steroids.   -Resolved.  A1c 5.2% -CBGs ranging from 98-149

## 2021-07-11 NOTE — Progress Notes (Signed)
Physical Therapy Treatment Patient Details Name: Brian [name] MRN: [id number] DOB: 1960/09/05 Today's Date: 07/11/2021   History of Present Illness Pt is a 61 y/o male admitted 06/29/21 following C3-7 ACDF after progressive cervical myelopathy symptoms with subsequent fall and severe incomplete SCI. No significant PMH. 2/8: Status post reexploration of anterior cervical fusion and removal of epidural hematoma.    PT Comments    Today's session was focused on muscle activation and standing balance. Pt was able to perform exercises with assistance involving quad and glut activation, as well as seated balance that challenged his base of support. He was able to stand 2x with bed elevated, blocking his knees, and max assist +2. Today he was unable to achieve full upright posture. During treatment session patient showed deficits in strength, endurance, activity tolerance. Recommending therapy services at skilled nursing system to address the previously stated deficits despite not having coverage for it. Will continue to follow acutely to maximize functional mobility, independence and safety. If discharged patient with need 24 hour capable, assistance with ADLs, as well as DME noted below.    Recommendations for follow up therapy are one component of a multi-disciplinary discharge planning process, led by the attending physician.  Recommendations may be updated based on patient status, additional functional criteria and insurance authorization.  Follow Up Recommendations  Skilled nursing-short term rehab (<3 hours/day)     Assistance Recommended at Discharge    Patient can return home with the following Two people to help with walking and/or transfers;Two people to help with bathing/dressing/bathroom;Direct supervision/assist for medications management;Direct supervision/assist for financial management;Assist for transportation;Assistance with feeding;Assistance with cooking/housework;Help with stairs  or ramp for entrance   Equipment Recommendations  Wheelchair (measurements PT);Hospital bed;Wheelchair cushion (measurements PT);BSC/3in1;Other (comment)    Recommendations for Other Services       Precautions / Restrictions Precautions Precautions: Fall;Cervical Required Braces or Orthoses: Cervical Brace Cervical Brace: Soft collar;At all times Restrictions Weight Bearing Restrictions: No     Mobility  Bed Mobility Overal bed mobility: Needs Assistance Bed Mobility: Sit to Sidelying, Rolling, Sidelying to Sit Rolling: Max assist Sidelying to sit: Max assist   Sit to supine: Max assist Sit to sidelying: +2 for physical assistance, Mod assist General bed mobility comments: Pt required max assist with bed mobility to get to EOB and mod assist +2 to get back to bed.    Transfers Overall transfer level: Needs assistance Equipment used: Rolling walker (2 wheels) Transfers: Sit to/from Stand Sit to Stand: Max assist, +2 physical assistance     Squat pivot transfers: Total assist     General transfer comment: Patient required Max assist +2 to stand from recliner and maintain posture.    Ambulation/Gait                   Stairs             Wheelchair Mobility    Modified Rankin (Stroke Patients Only)       Balance Overall balance assessment: Needs assistance Sitting-balance support: Feet supported Sitting balance-Leahy Scale: Poor Sitting balance - Comments: Patient was unable to maintain sitting balance without mod assistance. Patient required mod assistance and cuing to work on challenging his base of support with leaning A/P and Left/Right. Postural control: Posterior lean Standing balance support: Bilateral upper extremity supported Standing balance-Leahy Scale: Zero Standing balance comment: Pt was completely relient on PT +2 for standing balance; 3x at elevated EOB.  We stood at the edge of an elevated  bed; bed supported UE and blocked knees  from flexing. He was able to shift left to right with max assist +2. He required cuing to maintain upright posture.                            Cognition Arousal/Alertness: Awake/alert Behavior During Therapy: WFL for tasks assessed/performed Overall Cognitive Status: Impaired/Different from baseline Area of Impairment: Safety/judgement, Memory, Awareness                               General Comments: Pt was more aware of deficits today but still attributed his deficits in part to being in bed too long.        Exercises Other Exercises Other Exercises: Glute sets were performed seated in the recliner with cuing to push heel into therapist fingers. Other Exercises: Long arc quad were performed at EOB mod to max assist    General Comments General comments (skin integrity, edema, etc.): Nurse reported that pt had fallen out of bed earlier in the day trying to get to his table. Pt said he was meeting with a Education officer, museum to talk about his sittuation and ask about disability. He was educated on pupose of therapy; we are trying to get as much strength as we can in his body but he will need to adapt to doing things differently moving forward.      Pertinent Vitals/Pain Pain Assessment Pain Assessment: Faces Faces Pain Scale: Hurts little more Pain Location: Pt has no pain at rest, he feels with certian movements but can not describe where the pain is. Pain Descriptors / Indicators: Aching, Grimacing    Home Living                          Prior Function            PT Goals (current goals can now be found in the care plan section)      Frequency    Min 5X/week      PT Plan Current plan remains appropriate    Co-evaluation              AM-PAC PT "6 Clicks" Mobility   Outcome Measure  Help needed turning from your back to your side while in a flat bed without using bedrails?: A Lot Help needed moving from lying on your back to  sitting on the side of a flat bed without using bedrails?: A Lot Help needed moving to and from a bed to a chair (including a wheelchair)?: A Lot Help needed standing up from a chair using your arms (e.g., wheelchair or bedside chair)?: Total Help needed to walk in hospital room?: Total Help needed climbing 3-5 steps with a railing? : Total 6 Click Score: 9    End of Session Equipment Utilized During Treatment: Gait belt;Cervical collar Activity Tolerance: Patient tolerated treatment well Patient left: with call bell/phone within reach;in bed;with bed alarm set   PT Visit Diagnosis: Unsteadiness on feet (R26.81);History of falling (Z91.81);Ataxic gait (R26.0);Muscle weakness (generalized) (M62.81);Other symptoms and signs involving the nervous system (R29.898);Difficulty in walking, not elsewhere classified (R26.2)     Time: [id number] PT Time Calculation (min) (ACUTE ONLY): 53 min  Charges:  $Therapeutic Activity: 8-22 mins $Neuromuscular Re-education: 38-52 mins  Quenton Fetter, SPT    Quenton Fetter 07/11/2021, 5:44 PM

## 2021-07-11 NOTE — Progress Notes (Signed)
PROGRESS NOTE  Luana ShuGerald E Morua ZOX:[id number] DOB: 02/24/1961   PCP: Pcp, No  Patient is from: Home  DOA: 06/29/2021 LOS: 12  Chief complaints:  No chief complaint on file.    Brief Narrative / Interim history: 61 year old M with PMH of tobacco and EtOH use disorder who was admitted by neurosurgery for cervical myelopathy and underwent ACDF on 2/3.  Postop course complicated by strokelike symptoms, encephalopathy and prevertebral soft tissue swelling with foci of gas at the ventral epidural space.  He underwent exploration of cervical fusion with evacuation of the epidural hematoma on 2/7.  He was also started on CIWA on 2/11.   Hospital course complicated by delirium/encephalopathy, aspiration pneumonia.  Work-up for acute encephalopathy and CVA unrevealing.  Neurology signed off.  He is currently on dysphagia 2 diet.   Patient continues to have extremity weakness  Therapy recommended SNF.   Subjective: Seen and examined earlier this morning.  Very frustrated with weakness in both hands.  He thinks his weakness are getting worse.   Objective: Vitals:   07/11/21 0817 07/11/21 1102 07/11/21 1259 07/11/21 1533  BP: 106/83 104/81 121/89 111/82  Pulse: 86 (!) 103  (!) 120  Resp: 17 14 18 13   Temp: 98.3 F (36.8 C) 98.8 F (37.1 C) 97.8 F (36.6 C) 98.6 F (37 C)  TempSrc: Oral Oral Oral Oral  SpO2: 94% 96% 94% 99%  Weight:      Height:        Examination:  GENERAL: Appears frail.  Nontoxic. HEENT: MMM.  Vision and hearing grossly intact.  NECK: Supple.  No apparent JVD.  RESP: 99% on RA.  No IWOB.  Fair aeration bilaterally. CVS:  RRR. Heart sounds normal.  ABD/GI/GU: BS+. Abd soft, NTND.  MSK/EXT:  Moves extremities.  Significant muscle mass and subcu fat loss. SKIN: no apparent skin lesion or wound NEURO: Awake, alert and oriented appropriately.  Significant weakness in all extremities.  Exaggerated right patellar reflex. PSYCH: Calm.  No distress or  agitation.  Procedures:  2/3-ACDF 2/7-cervical epidural hematoma evacuation   Assessment and Plan: * Cervical myelopathy (HCC)- (present on admission) S/p ACDF on 2/3 complicated by epidural hematoma s/p evacuation on 2/7 -Per primary.  Acute metabolic encephalopathy Encephalopathy work-up including CT head, MRI brain, EEG, TSH, B12, ammonia unrevealing.  Some concern about alcohol withdrawal symptoms at some point.  Encephalopathy resolved. -Continue Seroquel -Reorientation and delirium precautions.  Physical deconditioning Significant weakness in all extremities as a result of cervical myelopathy and acute illness. -Continue PT/OT  Hyperglycemia Likely due to steroid.  Resolved.  A1c 5.2%.  Tobacco use disorder Encouraged cessation. Nicotine patch  Sepsis due to aspiration pneumonia Completed antibiotic course, and resolved.  Protein-calorie malnutrition, severe (HCC) See recommendation by dietitian below         Body mass index is 17.81 kg/m. Nutrition Problem: Moderate Malnutrition (in the context of social/environmental circumstances) Etiology:  (inadequate energy intake) Signs/Symptoms: mild fat depletion, moderate muscle depletion, severe muscle depletion Interventions: Refer to RD note for recommendations   DVT prophylaxis:  SCD's Start: 06/29/21 1231  Code Status: Full code Family Communication: Patient and/or RN. Available if any question.  Level of care: Progressive Status is: Inpatient  Disposition: Per primary.     Sch Meds:  Scheduled Meds:  Chlorhexidine Gluconate Cloth  6 each Topical Daily   mouth rinse  15 mL Mouth Rinse q12n4p   melatonin  3 mg Oral QHS   metoprolol tartrate  12.5 mg Oral BID  multivitamin with minerals  1 tablet Oral Daily   QUEtiapine  12.5 mg Oral BID   senna-docusate  1 tablet Per Tube BID   sodium chloride flush  3 mL Intravenous Q12H   tamsulosin  0.4 mg Oral Daily   Continuous Infusions:  sodium  chloride 250 mL (06/29/21 1316)   PRN Meds:.acetaminophen **OR** acetaminophen, bisacodyl, HYDROcodone-acetaminophen, HYDROcodone-acetaminophen, HYDROmorphone (DILAUDID) injection, menthol-cetylpyridinium **OR** phenol, ondansetron **OR** ondansetron (ZOFRAN) IV, sodium chloride flush, sodium phosphate, white petrolatum  Antimicrobials: Anti-infectives (From admission, onward)    Start     Dose/Rate Route Frequency Ordered Stop   07/04/21 1500  Ampicillin-Sulbactam (UNASYN) 3 g in sodium chloride 0.9 % 100 mL IVPB  Status:  Discontinued        3 g 200 mL/hr over 30 Minutes Intravenous Every 8 hours 07/04/21 0806 07/07/21 1009   07/04/21 0100  vancomycin (VANCOCIN) IVPB 1000 mg/200 mL premix  Status:  Discontinued        1,000 mg 200 mL/hr over 60 Minutes Intravenous Every 12 hours 07/03/21 1105 07/04/21 0750   07/03/21 1522  vancomycin (VANCOCIN) powder  Status:  Discontinued          As needed 07/03/21 1524 07/03/21 1549   07/03/21 1200  metroNIDAZOLE (FLAGYL) IVPB 500 mg  Status:  Discontinued        500 mg 100 mL/hr over 60 Minutes Intravenous 2 times daily 07/03/21 1100 07/03/21 1107   07/03/21 1200  ceFEPIme (MAXIPIME) 2 g in sodium chloride 0.9 % 100 mL IVPB  Status:  Discontinued        2 g 200 mL/hr over 30 Minutes Intravenous Every 8 hours 07/03/21 1102 07/04/21 0750   07/03/21 1200  metroNIDAZOLE (FLAGYL) IVPB 500 mg  Status:  Discontinued        500 mg 100 mL/hr over 60 Minutes Intravenous Every 8 hours 07/03/21 1107 07/04/21 0750   07/03/21 1145  vancomycin (VANCOREADY) IVPB 1500 mg/300 mL        1,500 mg 150 mL/hr over 120 Minutes Intravenous  Once 07/03/21 1100 07/03/21 1607   07/03/21 1045  Ampicillin-Sulbactam (UNASYN) 3 g in sodium chloride 0.9 % 100 mL IVPB  Status:  Discontinued        3 g 200 mL/hr over 30 Minutes Intravenous Every 6 hours 07/03/21 0957 07/03/21 1100   06/29/21 1215  ceFAZolin (ANCEF) IVPB 1 g/50 mL premix        1 g 100 mL/hr over 30 Minutes  Intravenous Every 8 hours 06/29/21 1205 06/29/21 1932   06/29/21 0800  ceFAZolin (ANCEF) IVPB 2g/100 mL premix        2 g 200 mL/hr over 30 Minutes Intravenous On call to O.R. 06/29/21 [id number] 06/29/21 0806   06/29/21 0751  ceFAZolin (ANCEF) 2-4 GM/100ML-% IVPB       Note to Pharmacy: Barbie Haggis N: cabinet override      06/29/21 0751 06/29/21 0819        I have personally reviewed the following labs and images: CBC: Recent Labs  Lab 07/07/21 1913 07/08/21 0454 07/09/21 0649 07/10/21 0603 07/11/21 0503  WBC 13.4* 11.0* 9.6 7.4 7.0  NEUTROABS 9.4*  --   --   --   --   HGB 11.0* 10.8* 10.6* 11.0* 11.3*  HCT 31.9* 31.8* 30.6* 31.4* 32.8*  MCV 97.0 97.0 97.5 96.3 97.3  PLT 414* 418* 470* 501* 465*   BMP &GFR Recent Labs  Lab 07/07/21 1913 07/08/21 0454 07/09/21 [id number] 07/10/21 0603 07/11/21 0503  NA 133* 131* 130* 133* 132*  K 3.7 3.6 3.9 3.9 3.7  CL 101 98 97* 100 99  CO2 23 23 22 23 25   GLUCOSE 100* 95 92 109* 113*  BUN 15 17 16  22* 23*  CREATININE 0.70 0.76 0.81 0.98 1.07  CALCIUM 9.4 9.5 9.5 10.3 9.8  MG 1.8  --   --  2.1  --   PHOS 3.0  --   --  4.3  --    Estimated Creatinine Clearance: 58.5 mL/min (by C-G formula based on SCr of 1.07 mg/dL). Liver & Pancreas: Recent Labs  Lab 07/07/21 1913 07/08/21 0454 07/09/21 0649 07/10/21 0603 07/11/21 0503  AST 157* 153* 102* 85* 65*  ALT 209* 213* 189* 154* 128*  ALKPHOS 98 97 95 92 93  BILITOT 1.1 1.0 1.2 0.9 0.8  PROT 8.0 7.7 7.8 8.0 7.8  ALBUMIN 2.9* 2.8* 2.9* 2.9* 2.9*   No results for input(s): LIPASE, AMYLASE in the last 168 hours. Recent Labs  Lab 07/07/21 1913  AMMONIA 34   Diabetic: No results for input(s): HGBA1C in the last 72 hours. Recent Labs  Lab 07/10/21 0732 07/10/21 1214 07/10/21 1532 07/10/21 2333 07/11/21 1606  GLUCAP 114* 147* 134* 111* 148*   Cardiac Enzymes: No results for input(s): CKTOTAL, CKMB, CKMBINDEX, TROPONINI in the last 168 hours. No results for input(s): PROBNP  in the last 8760 hours. Coagulation Profile: No results for input(s): INR, PROTIME in the last 168 hours. Thyroid Function Tests: No results for input(s): TSH, T4TOTAL, FREET4, T3FREE, THYROIDAB in the last 72 hours. Lipid Profile: No results for input(s): CHOL, HDL, LDLCALC, TRIG, CHOLHDL, LDLDIRECT in the last 72 hours. Anemia Panel: No results for input(s): VITAMINB12, FOLATE, FERRITIN, TIBC, IRON, RETICCTPCT in the last 72 hours. Urine analysis:    Component Value Date/Time   COLORURINE YELLOW 07/03/2021 1110   APPEARANCEUR CLEAR 07/03/2021 1110   LABSPEC 1.010 07/03/2021 1110   PHURINE 6.0 07/03/2021 1110   GLUCOSEU NEGATIVE 07/03/2021 1110   HGBUR TRACE (A) 07/03/2021 1110   BILIRUBINUR NEGATIVE 07/03/2021 1110   KETONESUR >80 (A) 07/03/2021 1110   PROTEINUR NEGATIVE 07/03/2021 1110   NITRITE NEGATIVE 07/03/2021 1110   LEUKOCYTESUR NEGATIVE 07/03/2021 1110   Sepsis Labs: Invalid input(s): PROCALCITONIN, [place]  Microbiology: Recent Results (from the past 240 hour(s))  Urine Culture     Status: None   Collection Time: 07/03/21 11:10 AM   Specimen: Urine, Catheterized  Result Value Ref Range Status   Specimen Description URINE, CATHETERIZED  Final   Special Requests NONE  Final   Culture   Final    NO GROWTH Performed at [facility], [address] [place], [place] [address]    Report Status 07/04/2021 FINAL  Final  MRSA Next Gen by PCR, Nasal     Status: None   Collection Time: 07/03/21 12:29 PM   Specimen: Nasal Mucosa; Nasal Swab  Result Value Ref Range Status   MRSA by PCR Next Gen NOT DETECTED NOT DETECTED Final    Comment: (NOTE) The GeneXpert MRSA Assay (FDA approved for NASAL specimens only), is one component of a comprehensive MRSA colonization surveillance program. It is not intended to diagnose MRSA infection nor to guide or monitor treatment for MRSA infections. Test performance is not FDA approved in patients less than 54  years old. Performed at [facility], [place] [address], [place], [place] [address]   Culture, blood (routine x 2)     Status: None  Collection Time: 07/03/21 12:53 PM   Specimen: BLOOD RIGHT HAND  Result Value Ref Range Status   Specimen Description BLOOD RIGHT HAND  Final   Special Requests   Final    BOTTLES DRAWN AEROBIC AND ANAEROBIC Blood Culture adequate volume   Culture   Final    NO GROWTH 5 DAYS Performed at [facility], [address] [place], [place] [address]    Report Status 07/08/2021 FINAL  Final  Culture, blood (routine x 2)     Status: None   Collection Time: 07/03/21  1:08 PM   Specimen: BLOOD LEFT HAND  Result Value Ref Range Status   Specimen Description BLOOD LEFT HAND  Final   Special Requests   Final    BOTTLES DRAWN AEROBIC AND ANAEROBIC Blood Culture adequate volume   Culture   Final    NO GROWTH 5 DAYS Performed at [facility], [place] [address] [place], [place] [address]    Report Status 07/08/2021 FINAL  Final    Radiology Studies: No results found.     Connelly [place] T. [place]ston  If 7PM-7AM, please contact night-coverage [email or web address] 07/11/2021, 4:44 PM

## 2021-07-12 DIAGNOSIS — G959 Disease of spinal cord, unspecified: Secondary | ICD-10-CM | POA: Diagnosis not present

## 2021-07-12 DIAGNOSIS — J690 Pneumonitis due to inhalation of food and vomit: Secondary | ICD-10-CM | POA: Diagnosis not present

## 2021-07-12 DIAGNOSIS — R739 Hyperglycemia, unspecified: Secondary | ICD-10-CM | POA: Diagnosis not present

## 2021-07-12 DIAGNOSIS — E440 Moderate protein-calorie malnutrition: Secondary | ICD-10-CM | POA: Insufficient documentation

## 2021-07-12 DIAGNOSIS — G9341 Metabolic encephalopathy: Secondary | ICD-10-CM | POA: Diagnosis not present

## 2021-07-12 DIAGNOSIS — E871 Hypo-osmolality and hyponatremia: Secondary | ICD-10-CM | POA: Diagnosis present

## 2021-07-12 DIAGNOSIS — R748 Abnormal levels of other serum enzymes: Secondary | ICD-10-CM | POA: Diagnosis present

## 2021-07-12 LAB — GLUCOSE, CAPILLARY
Glucose-Capillary: 113 mg/dL — ABNORMAL HIGH (ref 70–99)
Glucose-Capillary: 118 mg/dL — ABNORMAL HIGH (ref 70–99)
Glucose-Capillary: 120 mg/dL — ABNORMAL HIGH (ref 70–99)

## 2021-07-12 MED ORDER — ENSURE ENLIVE PO LIQD
237.0000 mL | Freq: Two times a day (BID) | ORAL | Status: DC
  Administered 2021-07-12 – 2021-07-15 (×3): 237 mL via ORAL

## 2021-07-12 NOTE — Assessment & Plan Note (Addendum)
-  Etiology likely secondary to dehydration.   -Urine sodium low.  - Held tube feeds 09/20/2021 due to emesis; now cleared by speech therapy for dysphagia 3 diet. -Na+ went from 135 -> 133 -> 138 -> 134 -> 133 >>> 135 >>> 136 >> 135 -Continue to monitor BMP intermittently

## 2021-07-12 NOTE — Progress Notes (Signed)
PROGRESS NOTE  Brian [name] [id number] DOB: 02-18-1961   PCP: Pcp, No  Patient is from: Home  DOA: 06/29/2021 LOS: 13  Chief complaints:  No chief complaint on file.    Brief Narrative / Interim history: 61 year old M with PMH of tobacco and EtOH use disorder who was admitted by neurosurgery for cervical myelopathy and underwent ACDF on 2/3.  Postop course complicated by strokelike symptoms, encephalopathy and prevertebral soft tissue swelling with foci of gas at the ventral epidural space.  He underwent exploration of cervical fusion with evacuation of the epidural hematoma on 2/7.  He was also started on CIWA on 2/11.   Hospital course complicated by delirium/encephalopathy, aspiration pneumonia.  Work-up for acute encephalopathy and CVA unrevealing.  Neurology signed off.  He is currently on dysphagia 2 diet.   Patient continues to have extremity weakness  Therapy recommended SNF.   Subjective: Seen and examined earlier this morning.  Somewhat frustrated with difficulty feeding himself due to weakness in his hands and arms.   Objective: Vitals:   07/11/21 2328 07/12/21 0326 07/12/21 0500 07/12/21 0750  BP: 97/73 (!) 127/92  118/79  Pulse: 90 89  100  Resp: 15 14  14   Temp: 98.7 F (37.1 C) 99.5 F (37.5 C)  98.2 F (36.8 C)  TempSrc: Oral Oral  Oral  SpO2: 100% 100%  96%  Weight:   56.3 kg   Height:        Examination:  GENERAL: Frail and chronically ill-appearing. HEENT: MMM.  Vision and hearing grossly intact.  NECK: Supple.  No apparent JVD.  RESP: 96% on RA.  No IWOB.  Fair aeration bilaterally. CVS:  RRR. Heart sounds normal.  ABD/GI/GU: BS+. Abd soft, NTND.  MSK/EXT:  Moves extremities but very weak.  Significant muscle mass and subcu fat loss. SKIN: no apparent skin lesion or wound NEURO: Awake and alert. Oriented fairly.  Weakness in all extremities.  Exaggerated right patellar reflex. PSYCH: Calm. Normal affect.  Procedures:   2/3-ACDF 2/7-cervical epidural hematoma evacuation   Assessment and Plan: * Cervical myelopathy ([place])- (present on admission) S/p ACDF on 2/3 complicated by epidural hematoma s/p evacuation on 2/7 -Per primary.  Acute metabolic encephalopathy Encephalopathy work-up including CT head, MRI brain, EEG, TSH, B12, ammonia unrevealing.  Some concern about alcohol withdrawal symptoms at some point.  Encephalopathy resolved. -Continue Seroquel -Reorientation and delirium precautions.  Physical deconditioning Significant weakness in all extremities as a result of cervical myelopathy and acute illness. -Continue PT/OT  Tobacco use disorder Encouraged cessation. Nicotine patch  Sepsis due to aspiration pneumonia Completed antibiotic course, and resolved.  Protein-calorie malnutrition, severe ([place]) As evidenced by poor p.o. intake, significant muscle mass and subcu fat loss and significant weight loss (about 16 pounds since this hospitalization). Nutrition Problem: Severe Malnutrition (in the context of social/environmental circumstances) Etiology:  (inadequate energy intake) Signs/Symptoms: mild fat depletion, severe muscle depletion, severe muscle depletion Interventions: Refer to RD note for recommendations  May need assistance with feeding.    Hyperglycemia-resolved as of 07/12/2021 Likely due to steroid.  Resolved.  A1c 5.2%.   DVT prophylaxis:  SCD's Start: 06/29/21 1231  Code Status: Full code Family Communication: Patient and/or RN. Available if any question.  Level of care: Progressive Status is: Inpatient  Disposition: Per primary.     Sch Meds:  Scheduled Meds:  Chlorhexidine Gluconate Cloth  6 each Topical Daily   feeding supplement  237 mL Oral BID BM   mouth rinse  15 mL  Mouth Rinse q12n4p   melatonin  3 mg Oral QHS   metoprolol tartrate  12.5 mg Oral BID   multivitamin with minerals  1 tablet Oral Daily   QUEtiapine  12.5 mg Oral BID   senna-docusate   1 tablet Per Tube BID   sodium chloride flush  3 mL Intravenous Q12H   tamsulosin  0.4 mg Oral Daily   Continuous Infusions:  sodium chloride 250 mL (06/29/21 1316)   PRN Meds:.acetaminophen **OR** acetaminophen, bisacodyl, HYDROcodone-acetaminophen, HYDROcodone-acetaminophen, HYDROmorphone (DILAUDID) injection, menthol-cetylpyridinium **OR** phenol, ondansetron **OR** ondansetron (ZOFRAN) IV, sodium chloride flush, sodium phosphate, white petrolatum  Antimicrobials: Anti-infectives (From admission, onward)    Start     Dose/Rate Route Frequency Ordered Stop   07/04/21 1500  Ampicillin-Sulbactam (UNASYN) 3 g in sodium chloride 0.9 % 100 mL IVPB  Status:  Discontinued        3 g 200 mL/hr over 30 Minutes Intravenous Every 8 hours 07/04/21 0806 07/07/21 1009   07/04/21 0100  vancomycin (VANCOCIN) IVPB 1000 mg/200 mL premix  Status:  Discontinued        1,000 mg 200 mL/hr over 60 Minutes Intravenous Every 12 hours 07/03/21 1105 07/04/21 0750   07/03/21 1522  vancomycin (VANCOCIN) powder  Status:  Discontinued          As needed 07/03/21 1524 07/03/21 1549   07/03/21 1200  metroNIDAZOLE (FLAGYL) IVPB 500 mg  Status:  Discontinued        500 mg 100 mL/hr over 60 Minutes Intravenous 2 times daily 07/03/21 1100 07/03/21 1107   07/03/21 1200  ceFEPIme (MAXIPIME) 2 g in sodium chloride 0.9 % 100 mL IVPB  Status:  Discontinued        2 g 200 mL/hr over 30 Minutes Intravenous Every 8 hours 07/03/21 1102 07/04/21 0750   07/03/21 1200  metroNIDAZOLE (FLAGYL) IVPB 500 mg  Status:  Discontinued        500 mg 100 mL/hr over 60 Minutes Intravenous Every 8 hours 07/03/21 1107 07/04/21 0750   07/03/21 1145  vancomycin (VANCOREADY) IVPB 1500 mg/300 mL        1,500 mg 150 mL/hr over 120 Minutes Intravenous  Once 07/03/21 1100 07/03/21 1607   07/03/21 1045  Ampicillin-Sulbactam (UNASYN) 3 g in sodium chloride 0.9 % 100 mL IVPB  Status:  Discontinued        3 g 200 mL/hr over 30 Minutes Intravenous Every  6 hours 07/03/21 0957 07/03/21 1100   06/29/21 1215  ceFAZolin (ANCEF) IVPB 1 g/50 mL premix        1 g 100 mL/hr over 30 Minutes Intravenous Every 8 hours 06/29/21 1205 06/29/21 1932   06/29/21 0800  ceFAZolin (ANCEF) IVPB 2g/100 mL premix        2 g 200 mL/hr over 30 Minutes Intravenous On call to O.R. 06/29/21 [id number] 06/29/21 0806   06/29/21 0751  ceFAZolin (ANCEF) 2-4 GM/100ML-% IVPB       Note to Pharmacy: Barbie Haggis N: cabinet override      06/29/21 0751 06/29/21 0819        I have personally reviewed the following labs and images: CBC: Recent Labs  Lab 07/07/21 1913 07/08/21 0454 07/09/21 0649 07/10/21 0603 07/11/21 0503  WBC 13.4* 11.0* 9.6 7.4 7.0  NEUTROABS 9.4*  --   --   --   --   HGB 11.0* 10.8* 10.6* 11.0* 11.3*  HCT 31.9* 31.8* 30.6* 31.4* 32.8*  MCV 97.0 97.0 97.5 96.3 97.3  PLT 414*  418* 470* 501* 465*   BMP &GFR Recent Labs  Lab 07/07/21 1913 07/08/21 0454 07/09/21 0649 07/10/21 0603 07/11/21 0503  NA 133* 131* 130* 133* 132*  K 3.7 3.6 3.9 3.9 3.7  CL 101 98 97* 100 99  CO2 23 23 22 23 25   GLUCOSE 100* 95 92 109* 113*  BUN 15 17 16  22* 23*  CREATININE 0.70 0.76 0.81 0.98 1.07  CALCIUM 9.4 9.5 9.5 10.3 9.8  MG 1.8  --   --  2.1  --   PHOS 3.0  --   --  4.3  --    Estimated Creatinine Clearance: 58.5 mL/min (by C-G formula based on SCr of 1.07 mg/dL). Liver & Pancreas: Recent Labs  Lab 07/07/21 1913 07/08/21 0454 07/09/21 0649 07/10/21 0603 07/11/21 0503  AST 157* 153* 102* 85* 65*  ALT 209* 213* 189* 154* 128*  ALKPHOS 98 97 95 92 93  BILITOT 1.1 1.0 1.2 0.9 0.8  PROT 8.0 7.7 7.8 8.0 7.8  ALBUMIN 2.9* 2.8* 2.9* 2.9* 2.9*   No results for input(s): LIPASE, AMYLASE in the last 168 hours. Recent Labs  Lab 07/07/21 1913  AMMONIA 34   Diabetic: No results for input(s): HGBA1C in the last 72 hours. Recent Labs  Lab 07/10/21 1532 07/10/21 2333 07/11/21 1606 07/11/21 2327 07/12/21 0748  GLUCAP 134* 111* 148* 137* 113*    Cardiac Enzymes: No results for input(s): CKTOTAL, CKMB, CKMBINDEX, TROPONINI in the last 168 hours. No results for input(s): PROBNP in the last 8760 hours. Coagulation Profile: No results for input(s): INR, PROTIME in the last 168 hours. Thyroid Function Tests: No results for input(s): TSH, T4TOTAL, FREET4, T3FREE, THYROIDAB in the last 72 hours. Lipid Profile: No results for input(s): CHOL, HDL, LDLCALC, TRIG, CHOLHDL, LDLDIRECT in the last 72 hours. Anemia Panel: No results for input(s): VITAMINB12, FOLATE, FERRITIN, TIBC, IRON, RETICCTPCT in the last 72 hours. Urine analysis:    Component Value Date/Time   COLORURINE YELLOW 07/03/2021 1110   APPEARANCEUR CLEAR 07/03/2021 1110   LABSPEC 1.010 07/03/2021 1110   PHURINE 6.0 07/03/2021 1110   GLUCOSEU NEGATIVE 07/03/2021 1110   HGBUR TRACE (A) 07/03/2021 1110   BILIRUBINUR NEGATIVE 07/03/2021 1110   KETONESUR >80 (A) 07/03/2021 1110   PROTEINUR NEGATIVE 07/03/2021 1110   NITRITE NEGATIVE 07/03/2021 1110   LEUKOCYTESUR NEGATIVE 07/03/2021 1110   Sepsis Labs: Invalid input(s): PROCALCITONIN, [place]  Microbiology: Recent Results (from the past 240 hour(s))  Urine Culture     Status: None   Collection Time: 07/03/21 11:10 AM   Specimen: Urine, Catheterized  Result Value Ref Range Status   Specimen Description URINE, CATHETERIZED  Final   Special Requests NONE  Final   Culture   Final    NO GROWTH Performed at [facility], [address] [place], [place] [address]    Report Status 07/04/2021 FINAL  Final  MRSA Next Gen by PCR, Nasal     Status: None   Collection Time: 07/03/21 12:29 PM   Specimen: Nasal Mucosa; Nasal Swab  Result Value Ref Range Status   MRSA by PCR Next Gen NOT DETECTED NOT DETECTED Final    Comment: (NOTE) The GeneXpert MRSA Assay (FDA approved for NASAL specimens only), is one component of a comprehensive MRSA colonization surveillance program. It is not intended to diagnose MRSA  infection nor to guide or monitor treatment for MRSA infections. Test performance is not FDA approved in patients less than 70 years old. Performed at [facility], [place] [address] [place], [place] [address]   Culture, blood (routine x 2)     Status: None   Collection Time: 07/03/21 12:53 PM   Specimen: BLOOD RIGHT HAND  Result Value Ref Range Status   Specimen Description BLOOD RIGHT HAND  Final   Special Requests   Final    BOTTLES DRAWN AEROBIC AND ANAEROBIC Blood Culture adequate volume   Culture   Final    NO GROWTH 5 DAYS Performed at [facility], [place] [address], [place], [place] [address]    Report Status 07/08/2021 FINAL  Final  Culture, blood (routine x 2)     Status: None   Collection Time: 07/03/21  1:08 PM   Specimen: BLOOD LEFT HAND  Result Value Ref Range Status   Specimen Description BLOOD LEFT HAND  Final   Special Requests   Final    BOTTLES DRAWN AEROBIC AND ANAEROBIC Blood Culture adequate volume   Culture   Final    NO GROWTH 5 DAYS Performed at [facility], [place] [address] [place], [place] [address]    Report Status 07/08/2021 FINAL  Final    Radiology Studies: No results found.     Jahmere Bramel T. [place]  If 7PM-7AM, please contact night-coverage [email or web address] 07/12/2021, 3:34 PM

## 2021-07-12 NOTE — Progress Notes (Signed)
Occupational Therapy Treatment Patient Details Name: Brian [name] MRN: [id number] DOB: 1961/02/15 Today's Date: 07/12/2021   History of present illness Pt is a 61 y/o male admitted 06/29/21 following C3-7 ACDF after progressive cervical myelopathy symptoms with subsequent fall and severe incomplete SCI. No significant PMH. 2/8: Status post reexploration of anterior cervical fusion and removal of epidural hematoma.   OT comments  Patient seen with OTR at beginning of treatment to address patient's needs for self feeding and possible splinting. Patient seen for self feeding with bed in chair position. Wrist support brace used to assist with wrist stability and holding utensil. Patient required assistance at RUE elbow to allow for elbow flexion and extension during self feeding and patient was able to load utensil 25% of time. Patient was max assist for managing cue and will address handled cup next session. Acute OT to continue to follow.    Recommendations for follow up therapy are one component of a multi-disciplinary discharge planning process, led by the attending physician.  Recommendations may be updated based on patient status, additional functional criteria and insurance authorization.    Follow Up Recommendations  Skilled nursing-short term rehab (<3 hours/day)    Assistance Recommended at Discharge Frequent or constant Supervision/Assistance  Patient can return home with the following  Two people to help with walking and/or transfers;A lot of help with bathing/dressing/bathroom;Direct supervision/assist for medications management;Assist for transportation;Help with stairs or ramp for entrance;Assistance with feeding   Equipment Recommendations  Wheelchair (measurements OT);Wheelchair cushion (measurements OT);Hospital bed;BSC/3in1;Tub/shower seat    Recommendations for Other Services      Precautions / Restrictions Precautions Precautions: Fall;Cervical Precaution Booklet  Issued: Yes (comment) Precaution Comments: Verbally reviewed precautions during functional mobility. Required Braces or Orthoses: Cervical Brace Cervical Brace: Soft collar;At all times Restrictions Weight Bearing Restrictions: No       Mobility Bed Mobility Overal bed mobility: Needs Assistance             General bed mobility comments: max assist of 2 to pull up in bed    Transfers                         Balance                                           ADL either performed or assessed with clinical judgement   ADL Overall ADL's : Needs assistance/impaired Eating/Feeding: Moderate assistance;With assist to don/doff brace/orthosis Eating/Feeding Details (indicate cue type and reason): wrist support utilized to assist with feeding and holding utensil. Patient required assistance at elbow to allow for elbow flexion and extension during meal. Patient was able to load utensil 25% of the time                                   General ADL Comments: self feeding addressed at bed level with bed in chair position    Extremity/Trunk Assessment Upper Extremity Assessment RUE Deficits / Details: shoulder 2-/5 strength; elbow 2-/5 strength, wrist and hand 2 to 2-/5; digits 2-/5 RUE Sensation: decreased light touch RUE Coordination: decreased fine motor;decreased gross motor LUE Deficits / Details: shoulder 2-/5 strength; elbow 2-/5 strength, wrist and hand 2 to 2-/5; digits 2-/5 LUE Sensation: decreased light touch LUE Coordination: decreased fine motor;decreased gross  motor            Vision       Perception     Praxis      Cognition Arousal/Alertness: Awake/alert Behavior During Therapy: WFL for tasks assessed/performed Overall Cognitive Status: Impaired/Different from baseline Area of Impairment: Safety/judgement, Memory, Awareness                     Memory: Decreased short-term memory, Decreased recall of  precautions   Safety/Judgement: Decreased awareness of deficits, Decreased awareness of safety     General Comments: Patient stated he was optimistic on his outcomes and benefits of therapy.        Exercises      Shoulder Instructions       General Comments      Pertinent Vitals/ Pain       Pain Assessment Pain Assessment: No/denies pain Pain Intervention(s): Monitored during session  Home Living                                          Prior Functioning/Environment              Frequency  Min 2X/week        Progress Toward Goals  OT Goals(current goals can now be found in the care plan section)  Progress towards OT goals: Progressing toward goals  Acute Rehab OT Goals Patient Stated Goal: get stronger OT Goal Formulation: With patient Time For Goal Achievement: 07/18/21 Potential to Achieve Goals: Fair ADL Goals Pt Will Perform Eating: with min assist;with adaptive utensils;sitting Pt Will Perform Grooming: with min assist;sitting Pt Will Transfer to Toilet: with min assist;stand pivot transfer;bedside commode Pt/caregiver will Perform Home Exercise Program: Increased strength;Both right and left upper extremity;With theraputty;With minimal assist;With written HEP provided  Plan Discharge plan remains appropriate    Co-evaluation                 AM-PAC OT "6 Clicks" Daily Activity     Outcome Measure   Help from another person eating meals?: A Lot Help from another person taking care of personal grooming?: A Lot Help from another person toileting, which includes using toliet, bedpan, or urinal?: Total Help from another person bathing (including washing, rinsing, drying)?: A Lot Help from another person to put on and taking off regular upper body clothing?: A Lot Help from another person to put on and taking off regular lower body clothing?: Total 6 Click Score: 10    End of Session Equipment Utilized During Treatment:  Cervical collar;Other (comment) (wrist brace for self feeding)  OT Visit Diagnosis: Unsteadiness on feet (R26.81);Muscle weakness (generalized) (M62.81);Ataxia, unspecified (R27.0);Feeding difficulties (R63.3)   Activity Tolerance Patient tolerated treatment well   Patient Left in bed;with call bell/phone within reach;with bed alarm set   Nurse Communication Other (comment) (on use of assistive devices to aide in self feeding)        Time: [id number] OT Time Calculation (min): 37 min  Charges: OT General Charges $OT Visit: 1 Visit OT Treatments $Self Care/Home Management : 8-22 mins  Lodema Hong, [place]  Pager [phone] Office [place] 07/12/2021, 1:58 PM

## 2021-07-12 NOTE — Progress Notes (Signed)
° °  Providing Compassionate, Quality Care - Together   Subjective: Patient reports he is ready to go home. He tells me he needs to get back to work as he is running out of money. We discussed his physical limitations, but he seems to feel that he'll be able to manage since his boss has changed his role at work. He doesn't seem to grasp that he can't walk on his own, feed himself, or bathe/dress himself. He feels that he will be able to manage with the help of his neighbor, who he reports is a Marine scientist. When asked if he has actually dicussed this with her, he states, "She'll help me. This is what she's done for a living for the past 30 years." This neighbor has not been to the hospital to see the patient since admission.  Objective: Vital signs in last 24 hours: Temp:  [97.8 F (36.6 C)-99.5 F (37.5 C)] 98.2 F (36.8 C) (02/16 0750) Pulse Rate:  [89-120] 100 (02/16 0750) Resp:  [13-18] 14 (02/16 0750) BP: (97-127)/(73-94) 118/79 (02/16 0750) SpO2:  [94 %-100 %] 96 % (02/16 0750) Weight:  [56.3 kg] 56.3 kg (02/16 0500)  Intake/Output from previous day: 02/15 0701 - 02/16 0700 In: 123 [P.O.:120; I.V.:3] Out: 800 [Urine:800] Intake/Output this shift: Total I/O In: 300 [P.O.:300] Out: -   Alert and oriented x 4 PERRLA Speech clear CN II-XII grossly intact MAE, BUE deltoids 2/5, decreased fine motor, decreased sensation Incision is clean, dry, and intact   Lab Results: Recent Labs    07/10/21 0603 07/11/21 0503  WBC 7.4 7.0  HGB 11.0* 11.3*  HCT 31.4* 32.8*  PLT 501* 465*   BMET Recent Labs    07/10/21 0603 07/11/21 0503  NA 133* 132*  K 3.9 3.7  CL 100 99  CO2 23 25  GLUCOSE 109* 113*  BUN 22* 23*  CREATININE 0.98 1.07  CALCIUM 10.3 9.8    Studies/Results: No results found.  Assessment/Plan: Patient status post C3-4, C4-5, C5-6, C6-7 anterior cervical discectomy with interbody fusion by Dr. Annette Stable on 06/29/2021. Increased difficulty swallowing with lung  atelectasis and elevated temperatures on 07/02/2021. Made NPO following MBS by SLP. 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 CTA head and neck negative for stroke. MRI revealed epidural hematoma. Patient underwent exploration of his cervical fusion with evacuation of the epidural hematoma on 07/03/2021. Cortrak was placed on 07/04/2021. Patient's strength much improved since epidural hematoma evacuation. Cortrak removed 07/06/2021 and dysphagia 2 diet with nectar thick liquids started. Patient with delirium vs ETOH withdrawal. Discontinued steroids 07/06/2021. CIWA protocol started and discontinued 07/07/2021. CT and MRI 07/07/2021 negative. Unfortunately, patient's insurance does not cover CIR or SNF.   LOS: 13 days   -Patient would greatly benefit from acute inpatient rehabilitation. -I advised the patient to get his neighbor to come in to the hospital for discussion regarding the patient's needs following hospitalization. -Continue to mobilize as tolerated.    Viona Gilmore, DNP, AGNP-C Nurse Practitioner  [facility] [place] [address] [place], [place], [place] [address] P: [phone]     F: [phone]  07/12/2021, 11:56 AM

## 2021-07-12 NOTE — Assessment & Plan Note (Addendum)
-  Etiology likely secondary to sepsis from aspiration pneumonia as above.  Acute hepatitis panel negative.  RUQ ultrasound unremarkable. -Continue monitor LFTs intermittently as last AST was normal at 28 and ALT was 19 on last check

## 2021-07-12 NOTE — Progress Notes (Signed)
Speech Language Pathology Treatment: Dysphagia  Patient Details Name: Brian [name] MRN: [id number] DOB: 01-31-1961 Today's Date: 07/12/2021 Time: 1000-1017 SLP Time Calculation (min) (ACUTE ONLY): 17 min  Assessment / Plan / Recommendation Clinical Impression  Pt seen for dysphagia treatment with upgraded liquid consistency to thin for possible upgrade. He consumed straw sips thin water and soda with min cues for smaller sips without coughing/throat clearing but questionable incoordinated respiratory pattern. Recommend upright position with small sips thin, continue Dys 2 texture and pills crushed in puree. Give additional time to swallow and catch breath.    HPI HPI: 61 y/o admitted 06/29/21 following C3-7 ACDF after progressive cervical myelopathy symptoms with subsequent fall and severe incomplete SCI. No significant PMH.Repeat MBS for possible advancement.      SLP Plan  Continue with current plan of care      Recommendations for follow up therapy are one component of a multi-disciplinary discharge planning process, led by the attending physician.  Recommendations may be updated based on patient status, additional functional criteria and insurance authorization.    Recommendations  Diet recommendations: Thin liquid;Dysphagia 2 (fine chop) Liquids provided via: Cup;Straw Medication Administration: Crushed with puree Supervision: Staff to assist with self feeding;Full supervision/cueing for compensatory strategies Compensations: Slow rate;Small sips/bites;Minimize environmental distractions;Clear throat intermittently Postural Changes and/or Swallow Maneuvers: Seated upright 90 degrees                Oral Care Recommendations: Oral care BID Follow Up Recommendations: Skilled nursing-short term rehab (<3 hours/day) Assistance recommended at discharge: Frequent or constant Supervision/Assistance SLP Visit Diagnosis: Dysphagia, pharyngeal phase (R13.13) Plan: Continue with  current plan of care           Houston Siren  07/12/2021, 10:58 AM

## 2021-07-12 NOTE — Progress Notes (Signed)
Occupational Therapy Treatment Note  Pt seen partial session with COTA to further assess needs and POC. Pt talking about going home with a neighbor frien of his "Pam", who he states can take care of him. Pt does not know Pam's last name or number. Currently total A with bed mobility and Max to total A with ADL tasks due to deficits listed below. Discussed AE use with COTA, including use of WHO  to increase independence with self feeding. Recommend Air mattress due to increased risk for skin breakdown. Also recommend B Prevalon boots. Continue to recommend rehab at [facility].    07/12/21 1400  OT Visit Information  Last OT Received On 07/12/21  Assistance Needed +2  History of Present Illness Pt is a 61 y/o male admitted 06/29/21 following C3-7 ACDF after progressive cervical myelopathy symptoms with subsequent fall and severe incomplete SCI. No significant PMH. 2/8: Status post reexploration of anterior cervical fusion and removal of epidural hematoma.  Precautions  Precautions Fall;Cervical  Required Braces or Orthoses Cervical Brace  Cervical Brace Soft collar;At all times  Pain Assessment  Pain Assessment Faces  Faces Pain Scale 4  Pain Location generalized with mobility  Pain Descriptors / Indicators Grimacing  Pain Intervention(s) Limited activity within patient's tolerance  Cognition  Arousal/Alertness Awake/alert  Behavior During Therapy WFL for tasks assessed/performed  Overall Cognitive Status No family/caregiver present to determine baseline cognitive functioning  General Comments talking about going home tomorrow wiht his neighbor Pam who can take care of him however he does not know her last name or her phone number  Upper Extremity Assessment  Upper Extremity Assessment RUE deficits/detail;LUE deficits/detail  RUE Deficits / Details supinates and uncontrollable can touch lunar side of hadn to mouth with significant effort. No active shoulder movement against gravity; developing a  tenodesis pattern however unable to use functionally at tihis time  RUE Sensation decreased light touch;decreased proprioception  RUE Coordination decreased fine motor;decreased gross motor  LUE Deficits / Details overall stronger than R; apparent neuropraxia; able to touch hand to mouth with decreased effort, however has poor control of movement due to neuropraxia; able to extend digits with increased time adn use of visual feedback; ablet o maintain grasp on cloth to wipe mouth  Lower Extremity Assessment  Lower Extremity Assessment Defer to PT evaluation  ADL  General ADL Comments assessed use of AE for self-feeding; Would likely do best with WHO with u-cuff  Bed Mobility  Overal bed mobility Needs Assistance  Rolling Total assist  General bed mobility comments bed pad used; incontinenet; darkened area on sacrum however unsure if dark pigmented area;nsg made aware  Other Exercises  Other Exercises bridging in supine  Other Exercises in bridge position, using lateral leans of BLE to strength hips/core  OT - End of Session  Equipment Utilized During Treatment Cervical collar  Activity Tolerance Patient tolerated treatment well  Patient left in bed;with call bell/phone within reach;with bed alarm set  Nurse Communication Other (comment) (need for)  OT Assessment/Plan  OT Plan Discharge plan remains appropriate  OT Visit Diagnosis Unsteadiness on feet (R26.81);Muscle weakness (generalized) (M62.81);Ataxia, unspecified (R27.0);Feeding difficulties (R63.3);Pain;Other abnormalities of gait and mobility (R26.89)  OT Frequency (ACUTE ONLY) Min 2X/week  Follow Up Recommendations Skilled nursing-short term rehab (<3 hours/day)  Assistance recommended at discharge Frequent or constant Supervision/Assistance  Patient can return home with the following Two people to help with walking and/or transfers;A lot of help with bathing/dressing/bathroom;Direct supervision/assist for medications  management;Assist for transportation;Help with stairs  or ramp for entrance;Assistance with feeding  OT Equipment Wheelchair (measurements OT);Wheelchair cushion (measurements OT);Hospital bed;BSC/3in1;Tub/shower seat  AM-PAC OT "6 Clicks" Daily Activity Outcome Measure (Version 2)  Help from another person eating meals? 2  Help from another person taking care of personal grooming? 2  Help from another person toileting, which includes using toliet, bedpan, or urinal? 1  Help from another person bathing (including washing, rinsing, drying)? 2  Help from another person to put on and taking off regular upper body clothing? 2  Help from another person to put on and taking off regular lower body clothing? 1  6 Click Score 10  Progressive Mobility  What is the highest level of mobility based on the progressive mobility assessment? Level 1 (Bedfast) - Unable to balance while sitting on edge of bed  Activity Turned to right side  OT Goal Progression  Progress towards OT goals Progressing toward goals  Acute Rehab OT Goals  Patient Stated Goal to go home  OT Goal Formulation With patient  Time For Goal Achievement 07/18/21  Potential to Achieve Goals Fair  ADL Goals  Pt Will Perform Eating with min assist;with adaptive utensils;sitting  Pt Will Perform Grooming with min assist;sitting  Pt Will Transfer to Toilet with min assist;stand pivot transfer;bedside commode  Pt/caregiver will Perform Home Exercise Program Increased strength;Both right and left upper extremity;With theraputty;With minimal assist;With written HEP provided  OT Time Calculation  OT Start Time (ACUTE ONLY) 1215  OT Stop Time (ACUTE ONLY) 1237  OT Time Calculation (min) 22 min  OT General Charges  $OT Visit 1 Visit  OT Treatments  $Therapeutic Activity 8-22 mins  Maurie Boettcher, OT/L   Acute OT Clinical Specialist [place] Pager [phone] Office [phone]

## 2021-07-12 NOTE — Progress Notes (Addendum)
Pt reports numbness in bilateral finger tips and toes, pt states not new symptoms.  RN reviewed notes and sees decrease in sensation but no numbness notes.  Paged on call to notify per protocol.  Spoke with Dr. Ellene Route via nurse in [place] room, no new orders will continue to monitor.

## 2021-07-12 NOTE — Progress Notes (Signed)
Nutrition Follow-up  DOCUMENTATION CODES:   Non-severe (moderate) malnutrition in context of social or environmental circumstances  INTERVENTION:   - Continue Mighty Shake TID with meals, each supplement provides 330 kcal and 9 grams of protein   - Continue double protein portions TID with meals  - Add Ensure Enlive po BID, each supplement provides 350 kcal and 20 grams of protein   - MVI with minerals daily  NUTRITION DIAGNOSIS:   Moderate Malnutrition (in the context of social/environmental circumstances) related to  (inadequate energy intake) as evidenced by mild fat depletion, moderate muscle depletion, severe muscle depletion.  Ongoing, being addressed via oral nutrition supplements  GOAL:   Patient will meet greater than or equal to 90% of their needs  Progressing  MONITOR:   TF tolerance, Diet advancement, Labs  REASON FOR ASSESSMENT:   Consult Assessment of nutrition requirement/status  ASSESSMENT:   61 year old male with hx EtOH abuse, tobacco use, and spinal stenosis initially presented 2/3 for planned multilevel anterior cervical decompression and fusion surgery after experiencing progressive bilateral upper and lower extremity weakness and spasticity due to critical multilevel cervical spinal stenosis with spinal cord compression and signal change. Post-op recovery was complicated and was ultimately taken back to OR 2/7  02/03 - s/p anterior cervical discectomy with interbody fusion  02/05 - pt initially complained of worsening swallowing function 02/06 - MBS, NPO per SLP 02/07 - Code Stroke activated (negative) but transferred to ICU, s/p re-exploration anterior cervical fusion with evacuation of epidural hematoma 02/08 - Cortrak tube placed (tip gastric), tube feeds initiated 02/10 - MBS, diet advanced to dysphagia 2 with nectar-thick liquids, Cortrak removed 02/11 - NPO 02/13 - diet advanced to dysphagia 2 with nectar-thick liquids 02/16 - diet advanced  to dysphagia 2 with thin liquids  Spoke with pt at bedside. Pt in good spirits but asking to be moved to the window so that he can look outside. Per pt, he has made nursing staff aware and they are going to help him with this. Noted pt with multiple drinks on bedside table. Pt requesting RD get a straw for Mighty Shake so that he can consume some of it. Pt consumed 50% of Mighty Shake when RD in room. He reports that he enjoys the [employer] and states that he is eating "really well." Pt reports eating cheesy eggs for breakfast this morning.  Liquids advanced to thin this AM. RD to order Ensure Enlive between meals to optimize nutrition as pt seems to do better with liquids over solids.  Admit weight: 61.7 kg Current weight: 56.3 kg  Meal Completion: 20-100%  Medications reviewed and include: melatonin, MVI with minerals, senna  Labs reviewed: sodium 132, elevated LFTs (trending down) CBG's: 111-148 x 24 hours  Diet Order:   Diet Order             DIET DYS 2 Room service appropriate? Yes with Assist; Fluid consistency: Thin  Diet effective now                   EDUCATION NEEDS:   Education needs have been addressed  Skin:  Skin Assessment: Reviewed RN Assessment (surgical incisions to the neck)  Last BM:  07/08/21  Height:   Ht Readings from Last 1 Encounters:  06/29/21 5\' 10"  (1.778 m)    Weight:   Wt Readings from Last 1 Encounters:  07/12/21 56.3 kg    Ideal Body Weight:  78.2 kg  BMI:  Body mass index is 17.81  kg/m.  Estimated Nutritional Needs:   Kcal:  1800-2000 kcal/d  Protein:  90-105 g/d  Fluid:  > 2L/d    Gustavus Bryant, MS, RD, LDN Inpatient Clinical Dietitian Please see AMiON for contact information.

## 2021-07-12 NOTE — Progress Notes (Signed)
Physical Therapy Treatment Patient Details Name: Brian [name] MRN: [id number] DOB: 1961-01-03 Today's Date: 07/12/2021   History of Present Illness Pt is a 61 y/o male admitted 06/29/21 following C3-7 ACDF after progressive cervical myelopathy symptoms with subsequent fall and severe incomplete SCI. No significant PMH. 2/8: Status post reexploration of anterior cervical fusion and removal of epidural hematoma.    PT Comments    Pt required Max assist with bed mobility +2 for patient comfort and max assist with transfer to EOB to wheelchair. Patient was able to help with anterior lean during transfer. Pt exhibited neural tension with exercises in the wheelchair and had increased back pain with sitting upright in a standard wheelchair during session. He displayed deficits in proprioception; he stated he was ready to lay down when he was already laying in the bed. During treatment session patient showed deficits in strength, endurance, activity tolerance. Recommending therapy services at skilled nursing system to address the previously stated deficits despite not having coverage for it. Will continue to follow acutely to maximize functional mobility, independence and safety. If discharged patient with need 24 hour capable, assistance with ADLs, as well as DME noted below.  Recommendations for follow up therapy are one component of a multi-disciplinary discharge planning process, led by the attending physician.  Recommendations may be updated based on patient status, additional functional criteria and insurance authorization.  Follow Up Recommendations  Skilled nursing-short term rehab (<3 hours/day)     Assistance Recommended at Discharge Frequent or constant Supervision/Assistance  Patient can return home with the following Two people to help with walking and/or transfers;Two people to help with bathing/dressing/bathroom;Direct supervision/assist for medications management;Direct  supervision/assist for financial management;Assist for transportation;Assistance with feeding;Assistance with cooking/housework;Help with stairs or ramp for entrance   Equipment Recommendations  Wheelchair (measurements PT);Hospital bed;Wheelchair cushion (measurements PT);BSC/3in1;Other (comment)    Recommendations for Other Services       Precautions / Restrictions Precautions Precautions: Fall;Cervical Precaution Comments: Verbally reviewed precautions during functional mobility. Required Braces or Orthoses: Cervical Brace Cervical Brace: Soft collar;At all times Restrictions Weight Bearing Restrictions: No     Mobility  Bed Mobility Overal bed mobility: Needs Assistance Bed Mobility: Sit to Sidelying, Rolling, Sidelying to Sit Rolling: Total assist Sidelying to sit: Max assist, +2 for physical assistance     Sit to sidelying: +2 for physical assistance, Max assist General bed mobility comments: Pt required max assist +2 for physical assistance and pt comfort.    Transfers Overall transfer level: Needs assistance   Transfers: Bed to chair/wheelchair/BSC       Squat pivot transfers: Total assist     General transfer comment: Pt required total assist from EOB to wheelchair.    Ambulation/Gait                   Psychologist, counselling mobility: Yes Wheelchair Assistance Details (indicate cue type and reason): total assist for wheelchair mobility. PT pushed wheelchair.  Modified Rankin (Stroke Patients Only)       Balance Overall balance assessment: Needs assistance Sitting-balance support: Feet supported Sitting balance-Leahy Scale: Poor Sitting balance - Comments: Pt sat upright in a wheelchair and was able to perform leaning A/P and left/right with mod assist. He had increased pain from sitting upright in the chair for most of the session.       Standing balance comment: Standing balance was  not attempted today.  Cognition Arousal/Alertness: Awake/alert Behavior During Therapy: WFL for tasks assessed/performed Overall Cognitive Status: Impaired/Different from baseline Area of Impairment: Safety/judgement, Memory, Awareness                         Safety/Judgement: Decreased awareness of deficits, Decreased awareness of safety     General Comments: Pt was in a busy enviorment today and was more distracted during the session today.        Exercises General Exercises - Lower Extremity Long Arc Quad: Both, 10 reps, Seated, AAROM    General Comments General comments (skin integrity, edema, etc.): Pt was taken outside in a wheelchair today to for the session to work on sitting balance and theraputic exercise.      Pertinent Vitals/Pain Pain Assessment Pain Assessment: Faces Faces Pain Scale: Hurts even more Pain Location: Pt had no pain at rest but had pain generalized with mobility in his low back and right lower ribs. Pain Descriptors / Indicators: Grimacing, Discomfort    Home Living                          Prior Function            PT Goals (current goals can now be found in the care plan section)      Frequency    Min 5X/week      PT Plan Current plan remains appropriate    Co-evaluation              AM-PAC PT "6 Clicks" Mobility   Outcome Measure  Help needed turning from your back to your side while in a flat bed without using bedrails?: A Lot Help needed moving from lying on your back to sitting on the side of a flat bed without using bedrails?: A Lot Help needed moving to and from a bed to a chair (including a wheelchair)?: A Lot Help needed standing up from a chair using your arms (e.g., wheelchair or bedside chair)?: Total Help needed to walk in hospital room?: Total Help needed climbing 3-5 steps with a railing? : Total 6 Click Score: 9    End of Session Equipment  Utilized During Treatment: Gait belt;Cervical collar Activity Tolerance: Patient tolerated treatment well Patient left: with call bell/phone within reach;in bed;with bed alarm set   PT Visit Diagnosis: Unsteadiness on feet (R26.81);History of falling (Z91.81);Ataxic gait (R26.0);Muscle weakness (generalized) (M62.81);Other symptoms and signs involving the nervous system (R29.898);Difficulty in walking, not elsewhere classified (R26.2)     Time: 1440-1533 PT Time Calculation (min) (ACUTE ONLY): 53 min  Charges:  $Therapeutic Exercise: 8-22 mins $Therapeutic Activity: 8-22 mins $Neuromuscular Re-education: 8-22 mins                     Quenton Fetter, SPT    Quenton Fetter 07/12/2021, 5:38 PM

## 2021-07-13 ENCOUNTER — Inpatient Hospital Stay (HOSPITAL_COMMUNITY): Payer: 59

## 2021-07-13 DIAGNOSIS — R079 Chest pain, unspecified: Secondary | ICD-10-CM

## 2021-07-13 DIAGNOSIS — J690 Pneumonitis due to inhalation of food and vomit: Secondary | ICD-10-CM | POA: Diagnosis not present

## 2021-07-13 DIAGNOSIS — R739 Hyperglycemia, unspecified: Secondary | ICD-10-CM | POA: Diagnosis not present

## 2021-07-13 DIAGNOSIS — G959 Disease of spinal cord, unspecified: Secondary | ICD-10-CM | POA: Diagnosis not present

## 2021-07-13 DIAGNOSIS — G9341 Metabolic encephalopathy: Secondary | ICD-10-CM | POA: Diagnosis not present

## 2021-07-13 DIAGNOSIS — R000 Tachycardia, unspecified: Secondary | ICD-10-CM | POA: Diagnosis present

## 2021-07-13 DIAGNOSIS — E440 Moderate protein-calorie malnutrition: Secondary | ICD-10-CM | POA: Insufficient documentation

## 2021-07-13 LAB — CBC
HCT: 31.5 % — ABNORMAL LOW (ref 39.0–52.0)
Hemoglobin: 10.3 g/dL — ABNORMAL LOW (ref 13.0–17.0)
MCH: 32.1 pg (ref 26.0–34.0)
MCHC: 32.7 g/dL (ref 30.0–36.0)
MCV: 98.1 fL (ref 80.0–100.0)
Platelets: 396 10*3/uL (ref 150–400)
RBC: 3.21 MIL/uL — ABNORMAL LOW (ref 4.22–5.81)
RDW: 13.4 % (ref 11.5–15.5)
WBC: 18.2 10*3/uL — ABNORMAL HIGH (ref 4.0–10.5)
nRBC: 0 % (ref 0.0–0.2)

## 2021-07-13 LAB — GLUCOSE, CAPILLARY
Glucose-Capillary: 106 mg/dL — ABNORMAL HIGH (ref 70–99)
Glucose-Capillary: 110 mg/dL — ABNORMAL HIGH (ref 70–99)
Glucose-Capillary: 123 mg/dL — ABNORMAL HIGH (ref 70–99)

## 2021-07-13 LAB — URINALYSIS, ROUTINE W REFLEX MICROSCOPIC
Bilirubin Urine: NEGATIVE
Glucose, UA: NEGATIVE mg/dL
Ketones, ur: NEGATIVE mg/dL
Leukocytes,Ua: NEGATIVE
Nitrite: NEGATIVE
Protein, ur: 30 mg/dL — AB
RBC / HPF: >50 RBC/hpf — ABNORMAL HIGH (ref 0–5)
Specific Gravity, Urine: 1.019 (ref 1.005–1.030)
pH: 5 (ref 5.0–8.0)

## 2021-07-13 LAB — TROPONIN I (HIGH SENSITIVITY)
Troponin I (High Sensitivity): 12 ng/L (ref <18)
Troponin I (High Sensitivity): 22 ng/L — ABNORMAL HIGH (ref <18)
Troponin I (High Sensitivity): 137 ng/L (ref <18)

## 2021-07-13 LAB — CREATININE, SERUM
Creatinine, Ser: 0.99 mg/dL (ref 0.61–1.24)
GFR, Estimated: >60 mL/min (ref >60)

## 2021-07-13 IMAGING — DX DG CHEST 1V PORT
1 series · 1 of 1 positions shown · non-contrast
Comparison: Chest x-ray [DATE]

CLINICAL DATA: Shortness of breath

EXAM:
PORTABLE CHEST 1 VIEW

[chest ap]
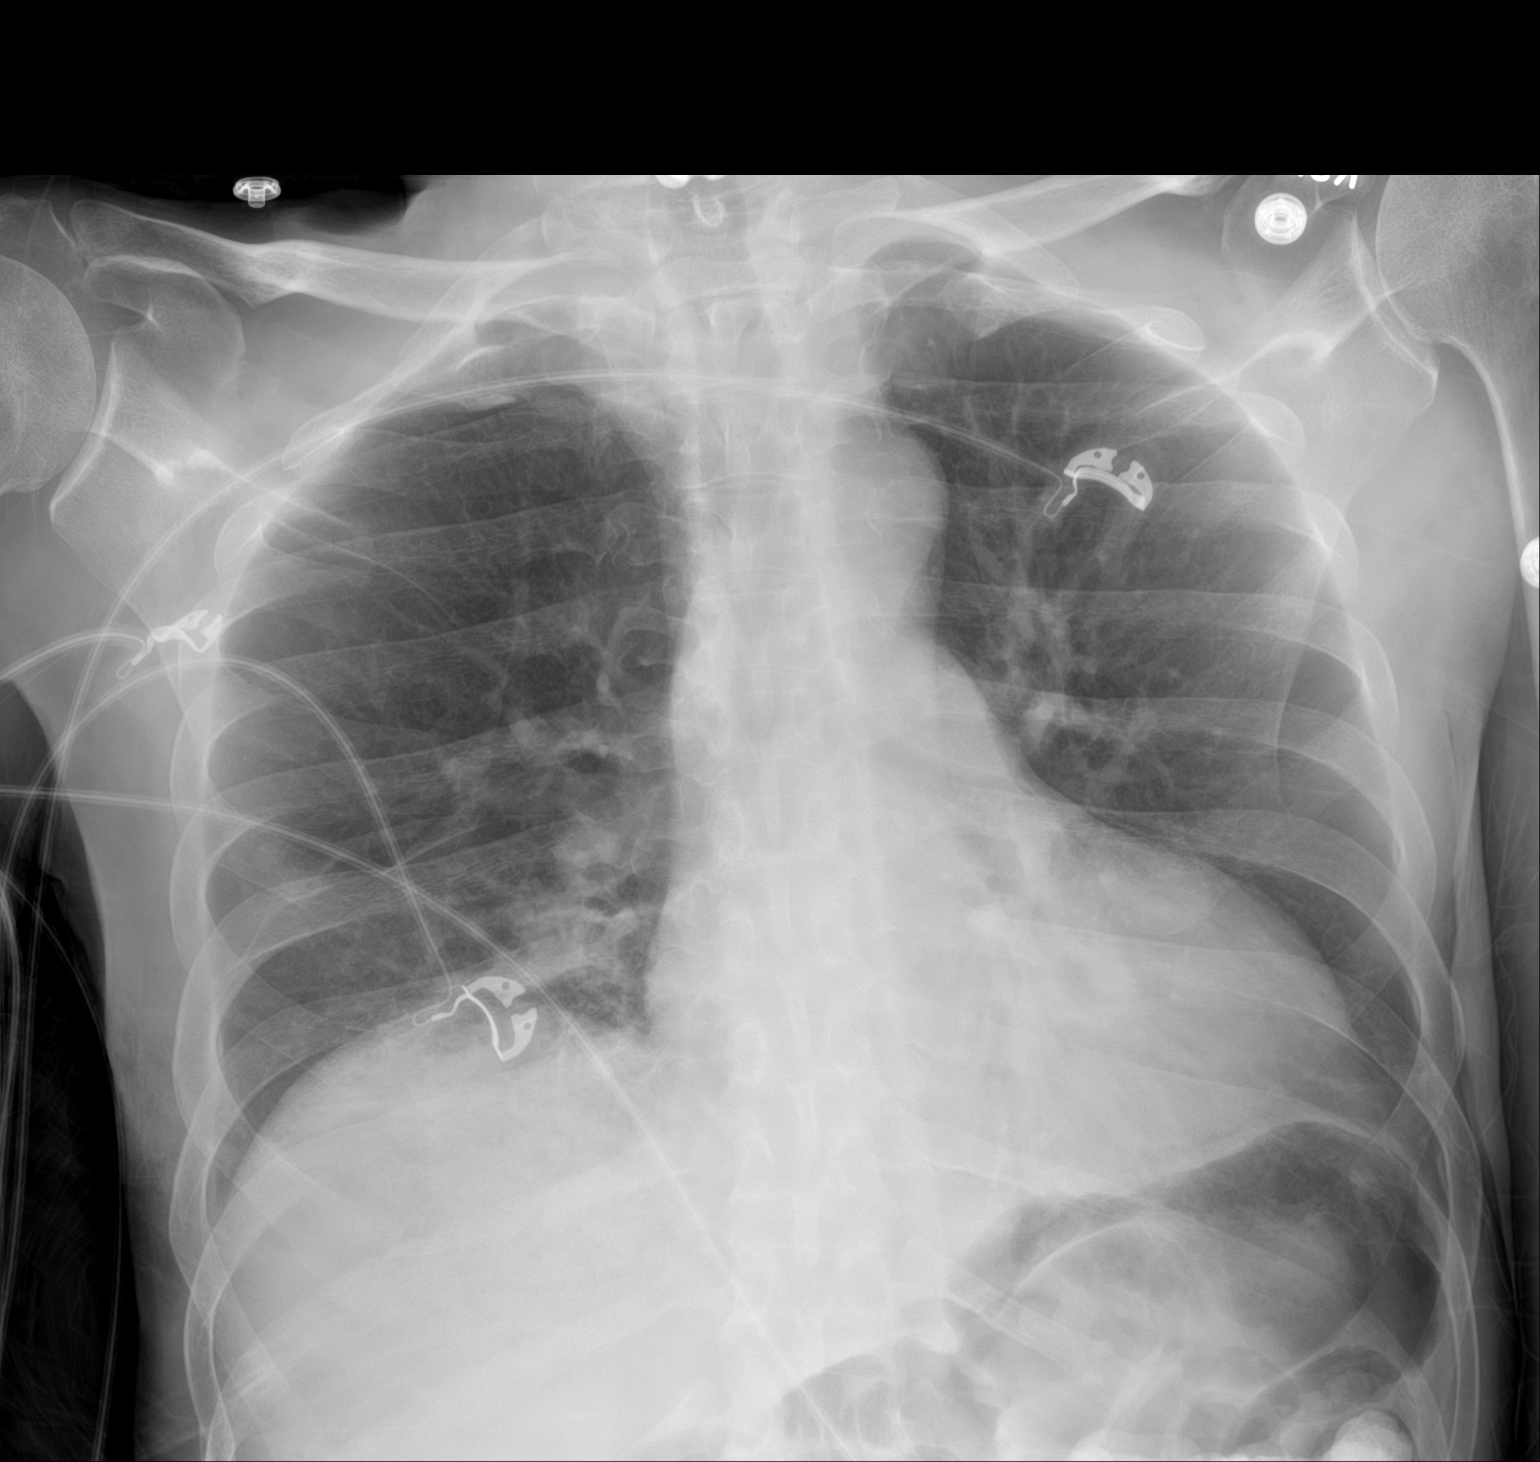

[1 of 1 positions shown; findings below may reference images not displayed]

FINDINGS: Heart size and mediastinum are stable and within normal limits.
Pulmonary vasculature is normal. No focal consolidation identified.
Mildly low lung volumes with likely subsegmental atelectatic changes
at the lung bases. No significant pleural effusion visualized. No
pneumothorax.
IMPRESSION: Low lung volumes with no acute process identified.

## 2021-07-13 MED ORDER — ALUM & MAG HYDROXIDE-SIMETH 200-200-20 MG/5ML PO SUSP: 30.0000 mL | Freq: Three times a day (TID) | ORAL | Status: DC | PRN

## 2021-07-13 MED ORDER — PANTOPRAZOLE SODIUM 40 MG PO TBEC
40.0000 mg | DELAYED_RELEASE_TABLET | Freq: Every day | ORAL | Status: DC
  Administered 2021-07-13 – 2021-07-15 (×3): 40 mg via ORAL
  Filled 2021-07-13 (×3): qty 1

## 2021-07-13 MED ORDER — OLANZAPINE 5 MG PO TABS
5.0000 mg | ORAL_TABLET | Freq: Two times a day (BID) | ORAL | Status: DC
Start: 2021-07-13 — End: 2021-07-15
  Administered 2021-07-13 – 2021-07-15 (×4): 5 mg via ORAL
  Filled 2021-07-13 (×4): qty 1

## 2021-07-13 MED ORDER — METOPROLOL TARTRATE 25 MG PO TABS
25.0000 mg | ORAL_TABLET | Freq: Two times a day (BID) | ORAL | Status: DC
  Administered 2021-07-13 – 2021-07-14 (×4): 25 mg via ORAL
  Filled 2021-07-13 (×4): qty 1

## 2021-07-13 MED ORDER — METOPROLOL TARTRATE 5 MG/5ML IV SOLN
5.0000 mg | Freq: Once | INTRAVENOUS | Status: AC
  Administered 2021-07-13: 5 mg via INTRAVENOUS
  Filled 2021-07-13: qty 5

## 2021-07-13 MED ORDER — ENOXAPARIN SODIUM 40 MG/0.4ML IJ SOSY
40.0000 mg | PREFILLED_SYRINGE | INTRAMUSCULAR | Status: DC
  Administered 2021-07-13: 40 mg via SUBCUTANEOUS
  Filled 2021-07-13: qty 0.4

## 2021-07-13 NOTE — Progress Notes (Addendum)
Speech Language Pathology Treatment: Dysphagia  Patient Details Name: Brian [name] MRN: [id number] DOB: [birth year] Today's Date: 07/13/2021 Time: 7412-8786 SLP Time Calculation (min) (ACUTE ONLY): 10 min  Assessment / Plan / Recommendation Clinical Impression  Requested to see pt today after complaining of painful/difficulty swallowing although SLP planned to see 2/2 liquids upgraded to thin yesterday at bedside. He appeared anxious but able to redirect and wincing in pain every 15-20 seconds reporting "hurts under my ribs." Observation with thin was mostly unremarkable except for questionable respiratory coordination with swallow of straw sips thin that may have been due to anxiety (?). For extra precaution given presentation, will downgrade liquids back to nectar thick and will perform MBS before next upgrade.    HPI HPI: 61 y/o admitted 06/29/21 following C3-7 ACDF after progressive cervical myelopathy symptoms with subsequent fall and severe incomplete SCI. No significant PMH.Repeat MBS for possible advancement.      SLP Plan  Continue with current plan of care      Recommendations for follow up therapy are one component of a multi-disciplinary discharge planning process, led by the attending physician.  Recommendations may be updated based on patient status, additional functional criteria and insurance authorization.    Recommendations  Diet recommendations: Dysphagia 2 (fine chop);Nectar-thick liquid Liquids provided via: Cup;Straw Medication Administration: Crushed with puree Supervision: Staff to assist with self feeding;Full supervision/cueing for compensatory strategies Compensations: Slow rate;Small sips/bites;Minimize environmental distractions;Clear throat intermittently Postural Changes and/or Swallow Maneuvers: Seated upright 90 degrees                Oral Care Recommendations: Oral care BID Follow Up Recommendations: Skilled nursing-short term rehab (<3  hours/day) Assistance recommended at discharge: Frequent or constant Supervision/Assistance SLP Visit Diagnosis: Dysphagia, pharyngeal phase (R13.13) Plan: Continue with current plan of care           Brian [name]  07/13/2021, 3:20 PM

## 2021-07-13 NOTE — Progress Notes (Signed)
Physical Therapy Treatment Patient Details Name: Brian [name] MRN: [id number] DOB: 1960-07-17 Today's Date: 07/13/2021   History of Present Illness Pt is a 61 y/o male admitted 06/29/21 following C3-7 ACDF after progressive cervical myelopathy symptoms including ataxia, bil UE and LE shaking and weakness, and loss of function in his hands with subsequent fall. Pt initially had improvement in neurological symptoms, but postoperative course was complicated by dysphasia and ataxia secondary to and epidural hematoma with evacuation of hematoma on 2/7. Pt also experienced delirium secondary to encephalopathy. He experienced aspiration pneumonia with subsequent sepsis while in the hospital. PMHx includes tobacco and EtOH use disorder.    PT Comments    During today's session pt was not feeling well. He was having trouble breathing upon entry into the room. After repositioning with total assist to a more neutral thoracic posture in the bed patient had improved breathing.  Pt's vitals were taken, monitored throughout the session and documented below. He was unfocused throughout the session. Pt SPaO2 improved with supplemental O2 and breathing exercises. Nurse was notified of vitals and was present at the end of the session. During treatment session patient showed deficits in strength, endurance, activity tolerance. Recommending therapy services at skilled nursing system to address the previously stated deficits despite not having coverage for it. Will continue to follow acutely to maximize functional mobility, independence and safety. If discharged patient with need 24 hour capable, assistance with ADLs, as well as DME noted below.  HR 120's and 130's, Temp: 101.1, SPaO2 on room air: in the 70's, SPaO2 on 3L of O2: 93  Recommendations for follow up therapy are one component of a multi-disciplinary discharge planning process, led by the attending physician.  Recommendations may be updated based on patient  status, additional functional criteria and insurance authorization.  Follow Up Recommendations  Skilled nursing-short term rehab (<3 hours/day)     Assistance Recommended at Discharge Frequent or constant Supervision/Assistance  Patient can return home with the following Two people to help with walking and/or transfers;Two people to help with bathing/dressing/bathroom;Direct supervision/assist for medications management;Direct supervision/assist for financial management;Assist for transportation;Assistance with feeding;Assistance with cooking/housework;Help with stairs or ramp for entrance   Equipment Recommendations  Wheelchair (measurements PT);Hospital bed;Wheelchair cushion (measurements PT);BSC/3in1;Other (comment)    Recommendations for Other Services Rehab consult     Precautions / Restrictions Precautions Precautions: Fall;Cervical Required Braces or Orthoses: Cervical Brace Cervical Brace: Soft collar;At all times Restrictions Weight Bearing Restrictions: No     Mobility  Bed Mobility Overal bed mobility: Needs Assistance             General bed mobility comments: Pt was repositioned in bed with Total assist.    Transfers                   General transfer comment: Pt was not transfered today.    Ambulation/Gait                   Stairs             Wheelchair Mobility    Modified Rankin (Stroke Patients Only)       Balance                                            Cognition Arousal/Alertness: Lethargic Behavior During Therapy: Anxious, Flat affect Overall Cognitive Status: Impaired/Different from baseline Area  of Impairment: Attention, Orientation, Awareness                               General Comments: Pt was unfocused during session and had trouble responding to questions that were not about his pain.        Exercises Other Exercises Other Exercises: Breathing exercises with cues  to take deep breaths in through his nose and out through his mouth.    General Comments General comments (skin integrity, edema, etc.): No balance was performed today. Pt vitals did not indicate EOB mobility. Nurse was notified of of abnomal vitals.      Pertinent Vitals/Pain Pain Assessment Pain Assessment: Faces Faces Pain Scale: Hurts even more Pain Location: Patient was having trouble describing the exact area of pain. Pain Descriptors / Indicators: Grimacing Pain Intervention(s): Monitored during session    Home Living                          Prior Function            PT Goals (current goals can now be found in the care plan section)      Frequency    Min 5X/week      PT Plan Current plan remains appropriate    Co-evaluation              AM-PAC PT "6 Clicks" Mobility   Outcome Measure  Help needed turning from your back to your side while in a flat bed without using bedrails?: Total Help needed moving from lying on your back to sitting on the side of a flat bed without using bedrails?: Total Help needed moving to and from a bed to a chair (including a wheelchair)?: Total Help needed standing up from a chair using your arms (e.g., wheelchair or bedside chair)?: Total Help needed to walk in hospital room?: Total Help needed climbing 3-5 steps with a railing? : Total 6 Click Score: 6    End of Session Equipment Utilized During Treatment: Gait belt;Cervical collar Activity Tolerance: Patient limited by fatigue;Patient limited by pain Patient left: with call bell/phone within reach;in bed;with bed alarm set Nurse Communication: Mobility status;Other (comment) (HR, Temperature, SPaO2 readings) PT Visit Diagnosis: Unsteadiness on feet (R26.81);History of falling (Z91.81);Ataxic gait (R26.0);Muscle weakness (generalized) (M62.81);Other symptoms and signs involving the nervous system (R29.898);Difficulty in walking, not elsewhere classified (R26.2)      Time: [id number] PT Time Calculation (min) (ACUTE ONLY): 13 min  Charges:  $Therapeutic Activity: 8-22 mins                     Quenton Fetter, SPT    Quenton Fetter 07/13/2021, 5:39 PM

## 2021-07-13 NOTE — Assessment & Plan Note (Addendum)
Multifactorial including musculoskeletal chest pain (somewhat reproducible), segmental PE and pneumonia.  EKG without acute ischemic finding.  Troponin trended from 11>> 137 likely demand ischemia from tachycardia and sepsis.  -Treat tachycardia and sepsis as above -Follow echocardiogram -Continue p.o. Protonix and GI cocktail as needed -Pain medication with bowel regimen

## 2021-07-13 NOTE — Assessment & Plan Note (Addendum)
Multifactorial including sepsis, dehydration, PE, agitation.   --Continue metoprolol 100 mg twice daily

## 2021-07-13 NOTE — Progress Notes (Signed)
°   07/13/21 0349  Assess: MEWS Score  Temp 98.5 F (36.9 C)  BP (!) 138/95  Pulse Rate (!) 117  ECG Heart Rate (!) 118  Resp 20  Level of Consciousness Alert  SpO2 97 %  O2 Device Room Air  Assess: MEWS Score  MEWS Temp 0  MEWS Systolic 0  MEWS Pulse 2  MEWS RR 0  MEWS LOC 0  MEWS Score 2  MEWS Score Color Yellow  Assess: if the MEWS score is Yellow or Red  Were vital signs taken at a resting state? Yes  Focused Assessment No change from prior assessment  Early Detection of Sepsis Score *See Row Information* Low  MEWS guidelines implemented *See Row Information* Yes  Treat  MEWS Interventions Administered prn meds/treatments  Neuro symptoms relieved by Rest  Take Vital Signs  Increase Vital Sign Frequency  Yellow: Q 2hr X 2 then Q 4hr X 2, if remains yellow, continue Q 4hrs  Escalate  MEWS: Escalate Yellow: discuss with charge nurse/RN and consider discussing with provider and RRT  Notify: Charge Nurse/RN  Name of Charge Nurse/RN Notified Geannie Risen, RN  Date Charge Nurse/RN Notified 07/13/21  Time Charge Nurse/RN Notified 0408  Document  Patient Outcome Stabilized after interventions  Progress note created (see row info) Yes

## 2021-07-13 NOTE — Progress Notes (Signed)
° °  Providing Compassionate, Quality Care - Together   Subjective: Patient is very confused today. Restless and agitated in the bed.  Objective: Vital signs in last 24 hours: Temp:  [97.8 F (36.6 C)-99.6 F (37.6 C)] 98.8 F (37.1 C) (02/17 1131) Pulse Rate:  [90-117] 93 (02/17 1131) Resp:  [11-20] 16 (02/17 1131) BP: (96-138)/(69-95) 124/88 (02/17 1131) SpO2:  [91 %-98 %] 93 % (02/17 1131) Weight:  [56.1 kg] 56.1 kg (02/17 0337)  Intake/Output from previous day: 02/16 0701 - 02/17 0700 In: 1120 [P.O.:1120] Out: 400 [Urine:400] Intake/Output this shift: Total I/O In: 100 [P.O.:100] Out: -   Alert and oriented to self Agitated PERRLA Word salad speech CN II-XII grossly intact MAE, BUE deltoids 2/5, decreased fine motor, decreased sensation Incision is clean, dry, and intact  Lab Results: Recent Labs    07/11/21 0503  WBC 7.0  HGB 11.3*  HCT 32.8*  PLT 465*   BMET Recent Labs    07/11/21 0503  NA 132*  K 3.7  CL 99  CO2 25  GLUCOSE 113*  BUN 23*  CREATININE 1.07  CALCIUM 9.8    Studies/Results: DG Chest Port 1 View  Result Date: 07/13/2021 CLINICAL DATA:  Shortness of breath EXAM: PORTABLE CHEST 1 VIEW COMPARISON:  Chest x-ray 07/10/2021 FINDINGS: Heart size and mediastinum are stable and within normal limits. Pulmonary vasculature is normal. No focal consolidation identified. Mildly low lung volumes with likely subsegmental atelectatic changes at the lung bases. No significant pleural effusion visualized. No pneumothorax. IMPRESSION: Low lung volumes with no acute process identified. Electronically Signed   By: Ofilia Neas M.D.   On: 07/13/2021 11:13    Assessment/Plan: Patient status post C3-4, C4-5, C5-6, C6-7 anterior cervical discectomy with interbody fusion by Dr. Annette Stable on 06/29/2021. Increased difficulty swallowing with lung atelectasis and elevated temperatures on 07/02/2021. Made NPO following MBS by SLP. Patient's neuro exam declined 07/03/2021  and he was found to be quadriparetic at shift change. CTA head and neck negative for stroke. MRI revealed epidural hematoma. Patient underwent exploration of his cervical fusion with evacuation of the epidural hematoma on 07/03/2021. Cortrak was placed on 07/04/2021. Patient's strength much improved since epidural hematoma evacuation. Cortrak removed 07/06/2021 and dysphagia 2 diet with nectar thick liquids started. Patient with delirium vs ETOH withdrawal. Discontinued steroids 07/06/2021. CIWA protocol started and discontinued 07/07/2021. CT and MRI 07/07/2021 negative. Seroquel 12.5 mg BID was started on 07/08/2021. Unfortunately, patient's insurance does not cover CIR or SNF.   LOS: 14 days     -Patient would greatly benefit from acute inpatient rehabilitation. -Reorient patient frequently. Will discuss increasing Serouquel or adding Olanzapine with Hospitalist team. -Continue to mobilize as tolerated.    Viona Gilmore, DNP, AGNP-C Nurse Practitioner  [facility] [place] [address], [address], [place], [place] [address] P: [phone]     F: [phone]  07/13/2021, 3:01 PM

## 2021-07-13 NOTE — Progress Notes (Signed)
PROGRESS NOTE  Brian [name] [id number] DOB: [birth year]   PCP: Pcp, No  Patient is from: Home  DOA: 06/29/2021 LOS: 14  Chief complaints:  No chief complaint on file.    Brief Narrative / Interim history: 61 year old M with PMH of tobacco and EtOH use disorder who was admitted by neurosurgery for cervical myelopathy and underwent ACDF on 2/3.  Postop course complicated by strokelike symptoms, encephalopathy and prevertebral soft tissue swelling with foci of gas at the ventral epidural space.  He underwent exploration of cervical fusion with evacuation of the epidural hematoma on 2/7.  He was also started on CIWA on 2/11.   Hospital course complicated by delirium/encephalopathy, aspiration pneumonia.  Work-up for acute encephalopathy and CVA unrevealing.  Neurology signed off.  He is currently on dysphagia 2 diet.   Patient continues to have extremity weakness. Also agitated at times.   Therapy recommended SNF.   Subjective: Seen and examined earlier this morning.  Patient was somewhat agitated and angry when transferred to special bed this morning.  Also complaining of chest pain.  Very angry and frustrated when asked to elaborate.  He points out epigastric areas with as to localize.  He also reports some shortness of breath although he does not seem to be in respiratory distress on room air with normal saturation.  Objective: Vitals:   07/13/21 0407 07/13/21 0546 07/13/21 0742 07/13/21 1131  BP:  106/79 119/87 124/88  Pulse: (!) 113 (!) 105 100 93  Resp: 18 11 14 16   Temp:  98.7 F (37.1 C) 98.3 F (36.8 C) 98.8 F (37.1 C)  TempSrc:  Oral Oral Oral  SpO2: 92% 91% 94% 93%  Weight:      Height:        Examination:  GENERAL: Frail and chronically ill-appearing.  Nontoxic. HEENT: MMM.  Vision and hearing grossly intact.  NECK: Supple.  No apparent JVD.  RESP: 93% on RA.  No IWOB.  Fair aeration bilaterally. CVS:  RRR. Heart sounds normal.  ABD/GI/GU: BS+. Abd  soft, NTND.  MSK/EXT:  Moves extremities.  Significant muscle mass and subcu fat loss. SKIN: no apparent skin lesion or wound NEURO: Awake and alert. Oriented fairly.  No apparent focal neuro deficit but generalized weakness. PSYCH: Somewhat angry  Procedures:  2/3-ACDF 2/7-cervical epidural hematoma evacuation   Assessment and Plan: * Cervical myelopathy ([place])- (present on admission) S/p ACDF on 2/3 complicated by epidural hematoma s/p evacuation on 2/7 -Per primary.  Acute metabolic encephalopathy Encephalopathy work-up including CT head, MRI brain, EEG, TSH, B12, ammonia unrevealing.  Some concern about alcohol withdrawal symptoms at some point.  Now agitated and angry despite low-dose Seroquel. -Change Seroquel to olanzapine -Reorientation and delirium precautions.  Protein-calorie malnutrition, severe ([place]) As evidenced by poor p.o. intake, significant muscle mass and subcu fat loss and significant weight loss (about 16 pounds since this hospitalization). Nutrition Problem: Severe Malnutrition (in the context of social/environmental circumstances) Etiology:  (inadequate energy intake) Signs/Symptoms: mild fat depletion, severe muscle depletion, severe muscle depletion Interventions: Refer to RD note for recommendations  May need assistance with feeding.  Sinus tachycardia Mostly with agitation. -Increase metoprolol to 25 mg twice daily  Chest pain at rest Likely musculoskeletal.  Chest pain seems to be reproducible.  Patient becomes angry when asked to elaborate he is chest pain.  EKG without acute ischemic finding.  CXR without acute finding.  Troponin 11>> 12 -Continue cycling troponin until it started to trend down -Start p.o. Protonix with as  needed GI cocktail -Continue scheduled senna -Pain control  Physical deconditioning Significant weakness in all extremities as a result of cervical myelopathy and acute illness. -Continue  PT/OT  Hyponatremia Stable. -Monitor intermittently  Elevated liver enzymes Improving. -Monitor intermittently  Tobacco use disorder Encouraged cessation. Nicotine patch  Sepsis due to aspiration pneumonia Completed antibiotic course, and resolved.  Hyperglycemia-resolved as of 07/12/2021 Likely due to steroid.  Resolved.  A1c 5.2%.   DVT prophylaxis:  SCD's Start: 06/29/21 1231  Code Status: Full code Family Communication: Patient and/or RN. Available if any question.  Level of care: Progressive Status is: Inpatient  Disposition: Per primary.     Sch Meds:  Scheduled Meds:  Chlorhexidine Gluconate Cloth  6 each Topical Daily   feeding supplement  237 mL Oral BID BM   mouth rinse  15 mL Mouth Rinse q12n4p   melatonin  3 mg Oral QHS   metoprolol tartrate  25 mg Oral BID   multivitamin with minerals  1 tablet Oral Daily   OLANZapine  5 mg Oral BID   pantoprazole  40 mg Oral Daily   senna-docusate  1 tablet Per Tube BID   sodium chloride flush  3 mL Intravenous Q12H   tamsulosin  0.4 mg Oral Daily   Continuous Infusions:  sodium chloride 250 mL (06/29/21 1316)   PRN Meds:.acetaminophen **OR** acetaminophen, alum & mag hydroxide-simeth, bisacodyl, HYDROcodone-acetaminophen, HYDROcodone-acetaminophen, HYDROmorphone (DILAUDID) injection, menthol-cetylpyridinium **OR** phenol, ondansetron **OR** ondansetron (ZOFRAN) IV, sodium chloride flush, sodium phosphate, white petrolatum  Antimicrobials: Anti-infectives (From admission, onward)    Start     Dose/Rate Route Frequency Ordered Stop   07/04/21 1500  Ampicillin-Sulbactam (UNASYN) 3 g in sodium chloride 0.9 % 100 mL IVPB  Status:  Discontinued        3 g 200 mL/hr over 30 Minutes Intravenous Every 8 hours 07/04/21 0806 07/07/21 1009   07/04/21 0100  vancomycin (VANCOCIN) IVPB 1000 mg/200 mL premix  Status:  Discontinued        1,000 mg 200 mL/hr over 60 Minutes Intravenous Every 12 hours 07/03/21 1105 07/04/21  0750   07/03/21 1522  vancomycin (VANCOCIN) powder  Status:  Discontinued          As needed 07/03/21 1524 07/03/21 1549   07/03/21 1200  metroNIDAZOLE (FLAGYL) IVPB 500 mg  Status:  Discontinued        500 mg 100 mL/hr over 60 Minutes Intravenous 2 times daily 07/03/21 1100 07/03/21 1107   07/03/21 1200  ceFEPIme (MAXIPIME) 2 g in sodium chloride 0.9 % 100 mL IVPB  Status:  Discontinued        2 g 200 mL/hr over 30 Minutes Intravenous Every 8 hours 07/03/21 1102 07/04/21 0750   07/03/21 1200  metroNIDAZOLE (FLAGYL) IVPB 500 mg  Status:  Discontinued        500 mg 100 mL/hr over 60 Minutes Intravenous Every 8 hours 07/03/21 1107 07/04/21 0750   07/03/21 1145  vancomycin (VANCOREADY) IVPB 1500 mg/300 mL        1,500 mg 150 mL/hr over 120 Minutes Intravenous  Once 07/03/21 1100 07/03/21 1607   07/03/21 1045  Ampicillin-Sulbactam (UNASYN) 3 g in sodium chloride 0.9 % 100 mL IVPB  Status:  Discontinued        3 g 200 mL/hr over 30 Minutes Intravenous Every 6 hours 07/03/21 0957 07/03/21 1100   06/29/21 1215  ceFAZolin (ANCEF) IVPB 1 g/50 mL premix        1 g 100 mL/hr over  30 Minutes Intravenous Every 8 hours 06/29/21 1205 06/29/21 1932   06/29/21 0800  ceFAZolin (ANCEF) IVPB 2g/100 mL premix        2 g 200 mL/hr over 30 Minutes Intravenous On call to O.R. 06/29/21 [id number] 06/29/21 0806   06/29/21 0751  ceFAZolin (ANCEF) 2-4 GM/100ML-% IVPB       Note to Pharmacy: Barbie Haggis N: cabinet override      06/29/21 0751 06/29/21 0819        I have personally reviewed the following labs and images: CBC: Recent Labs  Lab 07/07/21 1913 07/08/21 0454 07/09/21 0649 07/10/21 0603 07/11/21 0503  WBC 13.4* 11.0* 9.6 7.4 7.0  NEUTROABS 9.4*  --   --   --   --   HGB 11.0* 10.8* 10.6* 11.0* 11.3*  HCT 31.9* 31.8* 30.6* 31.4* 32.8*  MCV 97.0 97.0 97.5 96.3 97.3  PLT 414* 418* 470* 501* 465*   BMP &GFR Recent Labs  Lab 07/07/21 1913 07/08/21 0454 07/09/21 0649 07/10/21 0603  07/11/21 0503  NA 133* 131* 130* 133* 132*  K 3.7 3.6 3.9 3.9 3.7  CL 101 98 97* 100 99  CO2 23 23 22 23 25   GLUCOSE 100* 95 92 109* 113*  BUN 15 17 16  22* 23*  CREATININE 0.70 0.76 0.81 0.98 1.07  CALCIUM 9.4 9.5 9.5 10.3 9.8  MG 1.8  --   --  2.1  --   PHOS 3.0  --   --  4.3  --    Estimated Creatinine Clearance: 58.3 mL/min (by C-G formula based on SCr of 1.07 mg/dL). Liver & Pancreas: Recent Labs  Lab 07/07/21 1913 07/08/21 0454 07/09/21 0649 07/10/21 0603 07/11/21 0503  AST 157* 153* 102* 85* 65*  ALT 209* 213* 189* 154* 128*  ALKPHOS 98 97 95 92 93  BILITOT 1.1 1.0 1.2 0.9 0.8  PROT 8.0 7.7 7.8 8.0 7.8  ALBUMIN 2.9* 2.8* 2.9* 2.9* 2.9*   No results for input(s): LIPASE, AMYLASE in the last 168 hours. Recent Labs  Lab 07/07/21 1913  AMMONIA 34   Diabetic: No results for input(s): HGBA1C in the last 72 hours. Recent Labs  Lab 07/12/21 0748 07/12/21 1547 07/12/21 2331 07/13/21 0741 07/13/21 1526  GLUCAP 113* 120* 118* 106* 123*   Cardiac Enzymes: No results for input(s): CKTOTAL, CKMB, CKMBINDEX, TROPONINI in the last 168 hours. No results for input(s): PROBNP in the last 8760 hours. Coagulation Profile: No results for input(s): INR, PROTIME in the last 168 hours. Thyroid Function Tests: No results for input(s): TSH, T4TOTAL, FREET4, T3FREE, THYROIDAB in the last 72 hours. Lipid Profile: No results for input(s): CHOL, HDL, LDLCALC, TRIG, CHOLHDL, LDLDIRECT in the last 72 hours. Anemia Panel: No results for input(s): VITAMINB12, FOLATE, FERRITIN, TIBC, IRON, RETICCTPCT in the last 72 hours. Urine analysis:    Component Value Date/Time   COLORURINE YELLOW 07/03/2021 1110   APPEARANCEUR CLEAR 07/03/2021 1110   LABSPEC 1.010 07/03/2021 1110   PHURINE 6.0 07/03/2021 1110   GLUCOSEU NEGATIVE 07/03/2021 1110   HGBUR TRACE (A) 07/03/2021 1110   BILIRUBINUR NEGATIVE 07/03/2021 1110   KETONESUR >80 (A) 07/03/2021 1110   PROTEINUR NEGATIVE 07/03/2021 1110    NITRITE NEGATIVE 07/03/2021 1110   LEUKOCYTESUR NEGATIVE 07/03/2021 1110   Sepsis Labs: Invalid input(s): PROCALCITONIN, Cut Off  Microbiology: No results found for this or any previous visit (from the past 240 hour(s)).   Radiology Studies: DG Chest Port 1 View  Result Date: 07/13/2021 CLINICAL DATA:  Shortness of  breath EXAM: PORTABLE CHEST 1 VIEW COMPARISON:  Chest x-ray 07/10/2021 FINDINGS: Heart size and mediastinum are stable and within normal limits. Pulmonary vasculature is normal. No focal consolidation identified. Mildly low lung volumes with likely subsegmental atelectatic changes at the lung bases. No significant pleural effusion visualized. No pneumothorax. IMPRESSION: Low lung volumes with no acute process identified. Electronically Signed   By: Ofilia Neas M.D.   On: 07/13/2021 11:13       Rabia Argote T. Elliott  If 7PM-7AM, please contact night-coverage [email or web address] 07/13/2021, 5:03 PM

## 2021-07-13 NOTE — Progress Notes (Signed)
°   07/13/21 1946  Assess: MEWS Score  BP 131/73  Pulse Rate (!) 140  ECG Heart Rate (!) 138  Resp 19  Level of Consciousness Alert  SpO2 100 %  O2 Device Nasal Cannula  O2 Flow Rate (L/min) 2 L/min  Assess: MEWS Score  MEWS Temp 0  MEWS Systolic 0  MEWS Pulse 3  MEWS RR 0  MEWS LOC 0  MEWS Score 3  MEWS Score Color Yellow  Assess: if the MEWS score is Yellow or Red  Were vital signs taken at a resting state? Yes  Focused Assessment Change from prior assessment (see assessment flowsheet)  Early Detection of Sepsis Score *See Row Information* Low  MEWS guidelines implemented *See Row Information* Yes  Treat  MEWS Interventions Administered prn meds/treatments  Pain Scale 0-10  Pain Score Asleep  Take Vital Signs  Increase Vital Sign Frequency  Yellow: Q 2hr X 2 then Q 4hr X 2, if remains yellow, continue Q 4hrs  Escalate  MEWS: Escalate Yellow: discuss with charge nurse/RN and consider discussing with provider and RRT  Notify: Charge Nurse/RN  Name of Charge Nurse/RN Notified Vicie Mutters., RN  Date Charge Nurse/RN Notified 07/13/21  Time Charge Nurse/RN Notified 1946  Notify: Provider  Provider Name/Title Linton Flemings  Date Provider Notified 07/13/21  Time Provider Notified 2000  Notification Type Page  Notification Reason Critical result  Provider response Evaluate remotely  Date of Provider Response 07/13/21  Time of Provider Response 2015  Document  Patient Outcome Stabilized after interventions  Progress note created (see row info) Yes

## 2021-07-13 NOTE — Progress Notes (Addendum)
Text messaged [place] about pt being confused; HR is 140's pt flaying arms about, non diaphoretic at this time, pt reporting no pain.  Will continue to access and await orders.  New orders were to give metoprolol dose early.

## 2021-07-13 NOTE — Progress Notes (Signed)
Received critical lab value on Troponin 137; paged Dr. Bruna Potter.

## 2021-07-13 NOTE — Progress Notes (Addendum)
Modified Barium Swallow    07/02/21 1300  SLP Visit Information  SLP Received On 07/02/21  Pain Assessment  Pain Assessment Faces  Faces Pain Scale 6  Pain Location Neck  Pain Descriptors / Indicators Grimacing;Sharp  Pain Intervention(s) Monitored during session  General Information  HPI 61 y/o admitted 06/29/21 following C3-7 ACDF after progressive cervical myelopathy symptoms with subsequent fall and severe incomplete SCI. No significant PMH.  Type of Study MBS-Modified Barium Swallow Study  Previous Swallow Assessment none  Diet Prior to this Study Regular;Thin liquids  Temperature Spikes Noted No  Respiratory Status Room air  History of Recent Intubation Yes  Length of Intubations (days)  (During ACDF Surgery)  Date extubated 06/29/21  Behavior/Cognition Alert;Cooperative;Pleasant mood  Oral Cavity Assessment WFL  Oral Care Completed by SLP No  Oral Cavity - Dentition Poor condition  Self-Feeding Abilities Needs assist  Patient Positioning Upright in chair  Baseline Vocal Quality Normal  Volitional Cough Strong  Volitional Swallow Able to elicit  Anatomy Other (Comment) (Pharyngeal Edema)  Pharyngeal Secretions Normal  Oral Assessment (Complete on admission/transfer/every shift)  Patient is AT RISK Order set for Adult Oral Care Protocol initiated -  "At Risk Patients" option selected (see row information)  Oral Motor/Sensory Function  Overall Oral Motor/Sensory Function WFL  Oral Preparation/Oral Phase  Oral Phase WFL  Pharyngeal Phase  Pharyngeal Phase Impaired  Pharyngeal - Nectar  Pharyngeal- Nectar Teaspoon NT  Pharyngeal- Nectar Cup Reduced pharyngeal peristalsis;Reduced epiglottic inversion;Reduced airway/laryngeal closure;Penetration/Aspiration during swallow;Penetration/Apiration after swallow;Moderate aspiration;Pharyngeal residue - valleculae;Pharyngeal residue - pyriform (Simultaneous filing. User may not have seen previous data.)  Pharyngeal Material  enters airway, passes BELOW cords and not ejected out despite cough attempt by patient  Pharyngeal- Nectar Straw NT  Pharyngeal - Thin  Pharyngeal- Thin Teaspoon NT  Pharyngeal- Thin Cup Reduced airway/laryngeal closure;Penetration/Aspiration during swallow;Penetration/Apiration after swallow;Moderate aspiration;Pharyngeal residue - valleculae;Pharyngeal residue - pyriform;Reduced pharyngeal peristalsis;Reduced epiglottic inversion  Pharyngeal Material enters airway, passes BELOW cords and not ejected out despite cough attempt by patient  Pharyngeal- Thin Straw NT  Pharyngeal - Solids  Pharyngeal- Puree Trace aspiration;Penetration/Aspiration during swallow;Reduced pharyngeal peristalsis;Reduced epiglottic inversion;Reduced airway/laryngeal closure;Pharyngeal residue - valleculae;Pharyngeal residue - pyriform (Simultaneous filing. User may not have seen previous data.)  Pharyngeal Material enters airway, CONTACTS cords and not ejected out  Pharyngeal- Mechanical Soft NT  Pharyngeal- Regular NT  Pharyngeal- Multi-consistency NT  Pharyngeal- Pill NT  Cervical Esophageal Phase  Cervical Esophageal Phase [facility]  Clinical Impression  Clinical Impression Pt was seen for diagnostic modified barium swallow study. Pt was seated upright, alert and cooperative throughout the evaluation. Pt.'s anatomy observed to exhibit severe edema s/p ACDF surgery. To evaluate pt.'s swallowing function and safety, he was administered thin liquids, NTL, and puree textures. Oral phase of swallowing marked by oral holding of novel boluses and pt anticipating pharyngeal phase of swallow; allowed small amounts to flow backwards which delayed initiation of pharyngeal phase of swallowing. Anatomically, pt had inconsistent hyolarngeal elevation however overall functional and incomplete epiglottic deflection 2/2 severe edema. Aspiration noted across all textures except puree. When presented with thin and mildly thickened textures via  cup edge, pt aspirated and did not clear material from airway despite cough attempts (PAS 7). Puree contacted the vocal cords and was ejected with cough (PAS 5). Pt consumed miniscule amount of puree, and suspect a larger bolus would have resulted in aspiration.  Pt unable to tuck chin due to cervical collar and no other compensatory strategies deemed effecictve. Pt would  benefit from alternative means of nutrition (cortrak) during ACDF recovery. SLP to monitor for PO readiness, although timeline for prognosis is fair-good once edema. Pt allowed ice chips intermittently with supervision.  SLP Visit Diagnosis Dysphagia, pharyngeal phase (R13.13)  Impact on safety and function Severe aspiration risk (Simultaneous filing. User may not have seen previous data.)  Swallow Evaluation Recommendations  SLP Diet Recommendations NPO;Alternative means - temporary  Medication Administration Via alternative means  Treatment Plan  Oral Care Recommendations Oral care BID  Other Recommendations Have oral suction available  Treatment Recommendations Therapy as outlined in treatment plan below  Follow Up Recommendations Acute inpatient rehab (3hours/day)  Assistance recommended at discharge Set up Supervision/Assistance  Functional Status Assessment Patient has had a recent decline in their functional status and demonstrates the ability to make significant improvements in function in a reasonable and predictable amount of time.  Speech Therapy Frequency (ACUTE ONLY) min 2x/week  Treatment Duration 2 weeks  Interventions Trials of upgraded texture/liquids;Diet toleration management by SLP;Patient/family education  Prognosis  Prognosis for Safe Diet Advancement Good  Individuals Consulted  Consulted and Agree with Results and Recommendations Patient;RN  Progression Toward Goals  Progression toward goals Progressing toward goals  SLP Time Calculation  SLP Start Time (ACUTE ONLY) 1305  SLP Stop Time (ACUTE ONLY)  1325  SLP Time Calculation (min) (ACUTE ONLY) 20 min  SLP Evaluations  $ SLP Speech Visit 1 Visit  SLP Evaluations  $MBS Swallow 1 Procedure

## 2021-07-14 ENCOUNTER — Inpatient Hospital Stay (HOSPITAL_COMMUNITY): Payer: 59

## 2021-07-14 DIAGNOSIS — R778 Other specified abnormalities of plasma proteins: Secondary | ICD-10-CM

## 2021-07-14 DIAGNOSIS — R079 Chest pain, unspecified: Secondary | ICD-10-CM

## 2021-07-14 DIAGNOSIS — J189 Pneumonia, unspecified organism: Secondary | ICD-10-CM | POA: Diagnosis not present

## 2021-07-14 DIAGNOSIS — D72825 Bandemia: Secondary | ICD-10-CM

## 2021-07-14 DIAGNOSIS — G959 Disease of spinal cord, unspecified: Secondary | ICD-10-CM | POA: Diagnosis not present

## 2021-07-14 DIAGNOSIS — R000 Tachycardia, unspecified: Secondary | ICD-10-CM

## 2021-07-14 DIAGNOSIS — I2699 Other pulmonary embolism without acute cor pulmonale: Secondary | ICD-10-CM | POA: Diagnosis not present

## 2021-07-14 DIAGNOSIS — E871 Hypo-osmolality and hyponatremia: Secondary | ICD-10-CM

## 2021-07-14 DIAGNOSIS — R1312 Dysphagia, oropharyngeal phase: Secondary | ICD-10-CM

## 2021-07-14 DIAGNOSIS — G9341 Metabolic encephalopathy: Secondary | ICD-10-CM | POA: Diagnosis not present

## 2021-07-14 DIAGNOSIS — E440 Moderate protein-calorie malnutrition: Secondary | ICD-10-CM

## 2021-07-14 DIAGNOSIS — R748 Abnormal levels of other serum enzymes: Secondary | ICD-10-CM

## 2021-07-14 DIAGNOSIS — R1310 Dysphagia, unspecified: Secondary | ICD-10-CM

## 2021-07-14 DIAGNOSIS — A419 Sepsis, unspecified organism: Secondary | ICD-10-CM | POA: Diagnosis not present

## 2021-07-14 LAB — CK: Total CK: 43 U/L — ABNORMAL LOW (ref 49–397)

## 2021-07-14 LAB — GLUCOSE, CAPILLARY
Glucose-Capillary: 110 mg/dL — ABNORMAL HIGH (ref 70–99)
Glucose-Capillary: 187 mg/dL — ABNORMAL HIGH (ref 70–99)

## 2021-07-14 LAB — COMPREHENSIVE METABOLIC PANEL WITH GFR
ALT: 55 U/L — ABNORMAL HIGH (ref 0–44)
AST: 36 U/L (ref 15–41)
Albumin: 2.6 g/dL — ABNORMAL LOW (ref 3.5–5.0)
Alkaline Phosphatase: 116 U/L (ref 38–126)
Anion gap: 11 (ref 5–15)
BUN: 24 mg/dL — ABNORMAL HIGH (ref 6–20)
CO2: 23 mmol/L (ref 22–32)
Calcium: 9.4 mg/dL (ref 8.9–10.3)
Chloride: 94 mmol/L — ABNORMAL LOW (ref 98–111)
Creatinine, Ser: 1.1 mg/dL (ref 0.61–1.24)
GFR, Estimated: >60 mL/min (ref >60)
Glucose, Bld: 101 mg/dL — ABNORMAL HIGH (ref 70–99)
Potassium: 4.7 mmol/L (ref 3.5–5.1)
Sodium: 128 mmol/L — ABNORMAL LOW (ref 135–145)
Total Bilirubin: 1.5 mg/dL — ABNORMAL HIGH (ref 0.3–1.2)
Total Protein: 8.2 g/dL — ABNORMAL HIGH (ref 6.5–8.1)

## 2021-07-14 LAB — IRON AND TIBC
Iron: 13 ug/dL — ABNORMAL LOW (ref 45–182)
Saturation Ratios: 6 % — ABNORMAL LOW (ref 17.9–39.5)
TIBC: 203 ug/dL — ABNORMAL LOW (ref 250–450)
UIBC: 190 ug/dL

## 2021-07-14 LAB — CBC WITH DIFFERENTIAL/PLATELET
Abs Immature Granulocytes: 0.17 10*3/uL — ABNORMAL HIGH (ref 0.00–0.07)
Basophils Absolute: 0.1 10*3/uL (ref 0.0–0.1)
Basophils Relative: 0 %
Eosinophils Absolute: 0 10*3/uL (ref 0.0–0.5)
Eosinophils Relative: 0 %
HCT: 31.2 % — ABNORMAL LOW (ref 39.0–52.0)
Hemoglobin: 10.9 g/dL — ABNORMAL LOW (ref 13.0–17.0)
Immature Granulocytes: 1 %
Lymphocytes Relative: 10 %
Lymphs Abs: 2.3 10*3/uL (ref 0.7–4.0)
MCH: 33.5 pg (ref 26.0–34.0)
MCHC: 34.9 g/dL (ref 30.0–36.0)
MCV: 96 fL (ref 80.0–100.0)
Monocytes Absolute: 2.6 10*3/uL — ABNORMAL HIGH (ref 0.1–1.0)
Monocytes Relative: 11 %
Neutro Abs: 18.2 10*3/uL — ABNORMAL HIGH (ref 1.7–7.7)
Neutrophils Relative %: 78 %
Platelets: 410 10*3/uL — ABNORMAL HIGH (ref 150–400)
RBC: 3.25 MIL/uL — ABNORMAL LOW (ref 4.22–5.81)
RDW: 13.5 % (ref 11.5–15.5)
WBC: 23.4 10*3/uL — ABNORMAL HIGH (ref 4.0–10.5)
nRBC: 0 % (ref 0.0–0.2)

## 2021-07-14 LAB — PHOSPHORUS: Phosphorus: 4.5 mg/dL (ref 2.5–4.6)

## 2021-07-14 LAB — LACTIC ACID, PLASMA
Lactic Acid, Venous: 1.1 mmol/L (ref 0.5–1.9)
Lactic Acid, Venous: 1.8 mmol/L (ref 0.5–1.9)

## 2021-07-14 LAB — RETICULOCYTES
Immature Retic Fract: 7.6 % (ref 2.3–15.9)
RBC.: 3.23 MIL/uL — ABNORMAL LOW (ref 4.22–5.81)
Retic Count, Absolute: 50.4 10*3/uL (ref 19.0–186.0)
Retic Ct Pct: 1.6 % (ref 0.4–3.1)

## 2021-07-14 LAB — SODIUM, URINE, RANDOM: Sodium, Ur: <10 mmol/L

## 2021-07-14 LAB — OSMOLALITY, URINE: Osmolality, Ur: 624 mOsm/kg (ref 300–900)

## 2021-07-14 LAB — FOLATE: Folate: 26.2 ng/mL (ref >5.9)

## 2021-07-14 LAB — MAGNESIUM: Magnesium: 2.1 mg/dL (ref 1.7–2.4)

## 2021-07-14 LAB — HEPARIN LEVEL (UNFRACTIONATED): Heparin Unfractionated: <0.1 IU/mL — ABNORMAL LOW (ref 0.30–0.70)

## 2021-07-14 LAB — FERRITIN: Ferritin: 719 ng/mL — ABNORMAL HIGH (ref 24–336)

## 2021-07-14 LAB — VITAMIN B12: Vitamin B-12: 549 pg/mL (ref 180–914)

## 2021-07-14 LAB — PROCALCITONIN: Procalcitonin: 8.61 ng/mL

## 2021-07-14 IMAGING — CT CT CHEST-ABD-PELV W/ CM
2 of 5 series · 13 of 36 positions shown, 15 images · IV contrast (agent unspecified)
Comparison: None available

CLINICAL DATA: Sepsis, evaluate source

EXAM:
CT CHEST, ABDOMEN, AND PELVIS WITH CONTRAST
TECHNIQUE: Multidetector CT imaging of the chest, abdomen and pelvis was
performed following the standard protocol during bolus
administration of intravenous contrast.

[Series 3: cap with 5mm st · axial · 0.71mm/px · z∈[+42,+557]mm · 10 of 127 slices shown, 12 images]
[im 12/127  mediastinal]
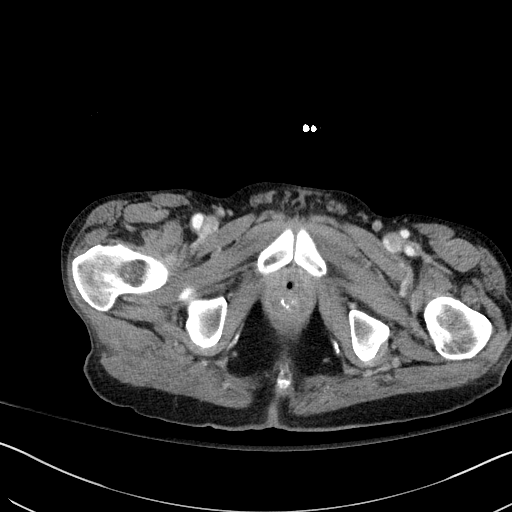
[im 12/127  bone]
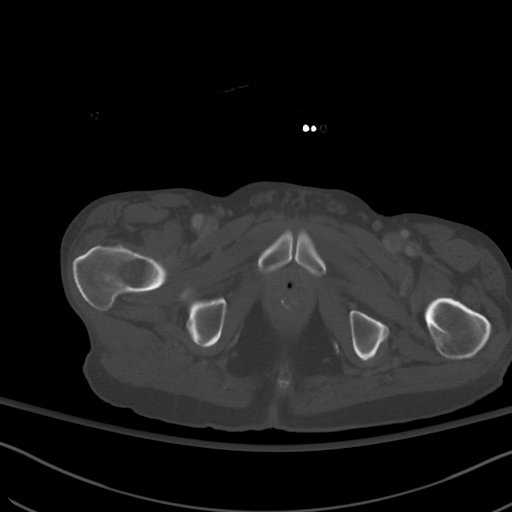
[im 23/127  mediastinal]
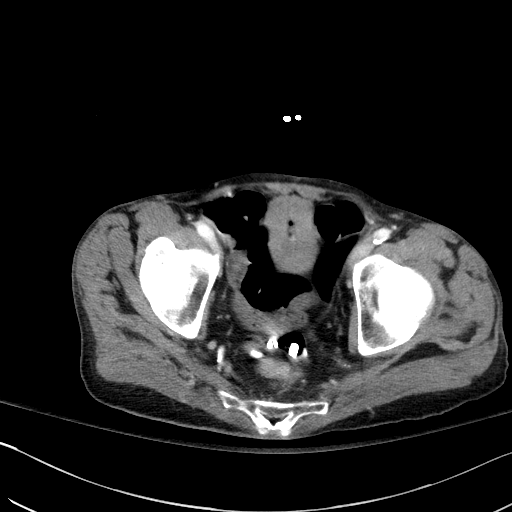
[im 35/127  mediastinal]
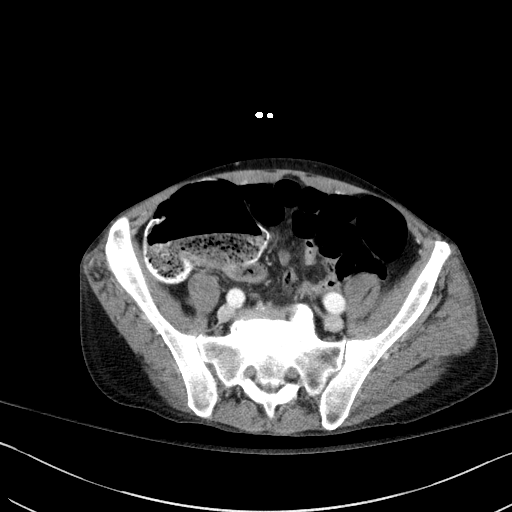
[im 46/127  mediastinal]
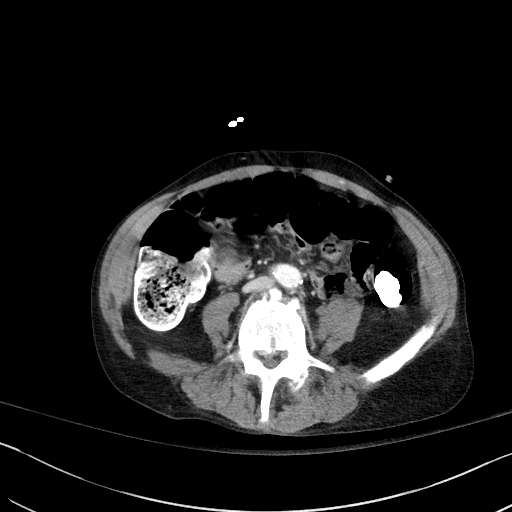
[im 58/127  mediastinal]
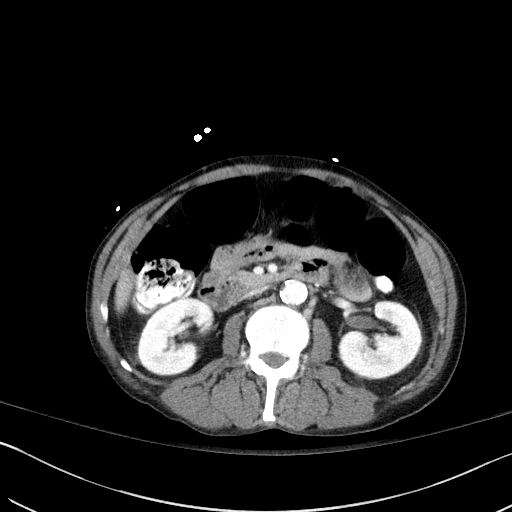
[im 69/127  mediastinal]
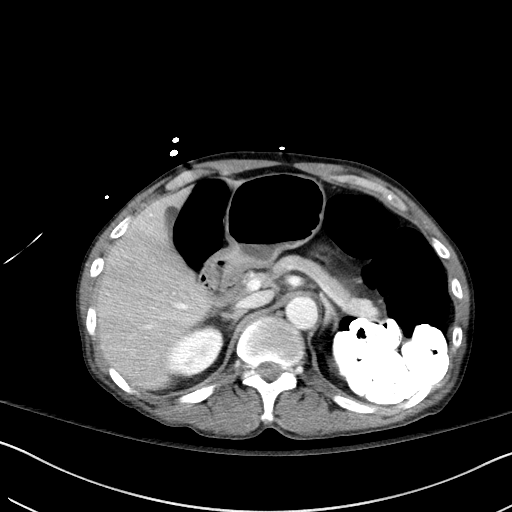
[im 81/127  mediastinal]
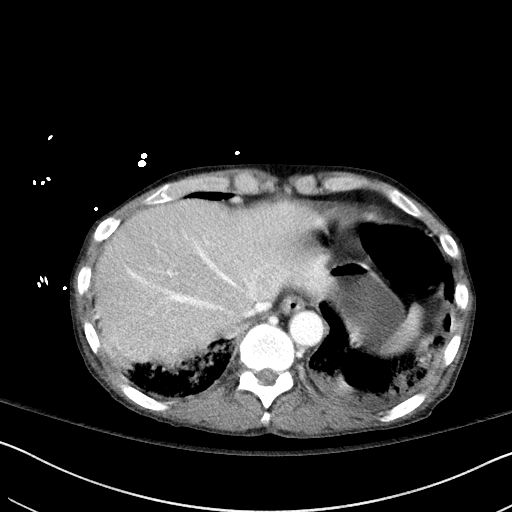
[im 92/127  mediastinal]
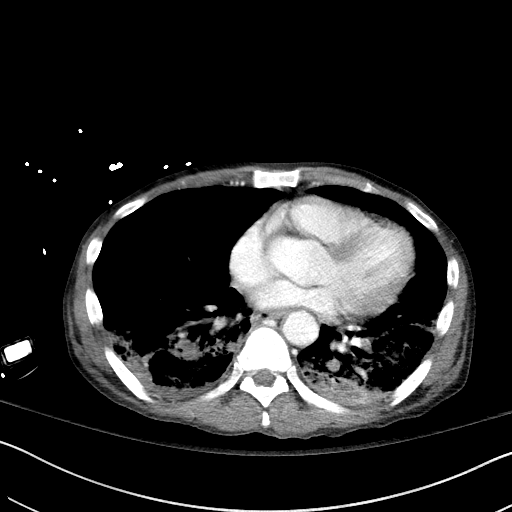
[im 104/127  mediastinal]
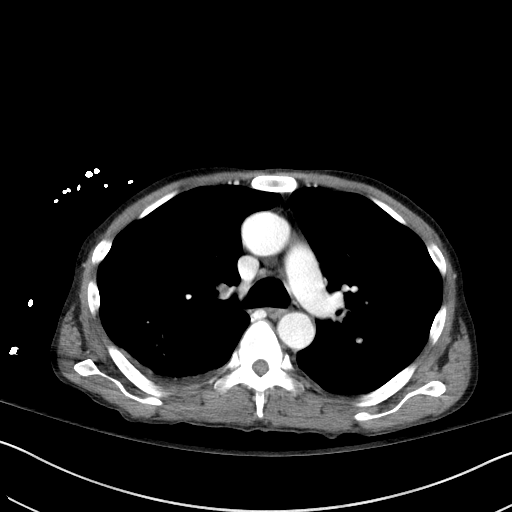
[im 104/127  bone]
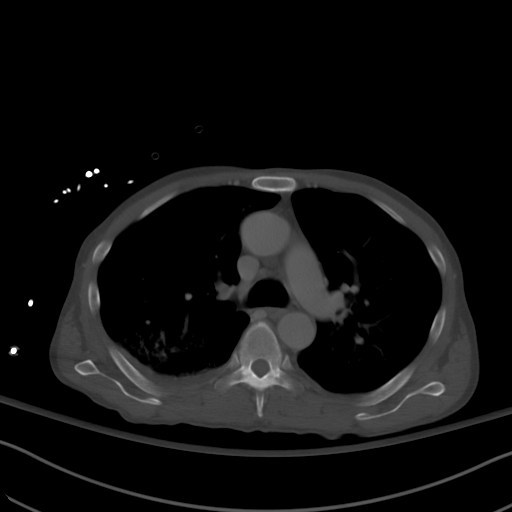
[im 115/127  mediastinal]
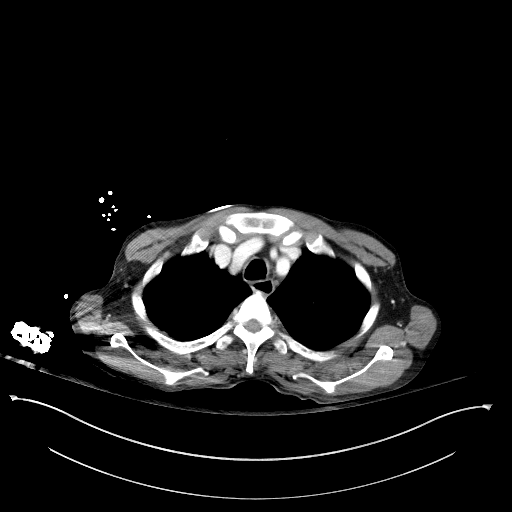

[Series 5: cap with 3mm st cor · coronal · 0.69mm/px · 3 of 120 slices shown]
[im 24/120  mediastinal]
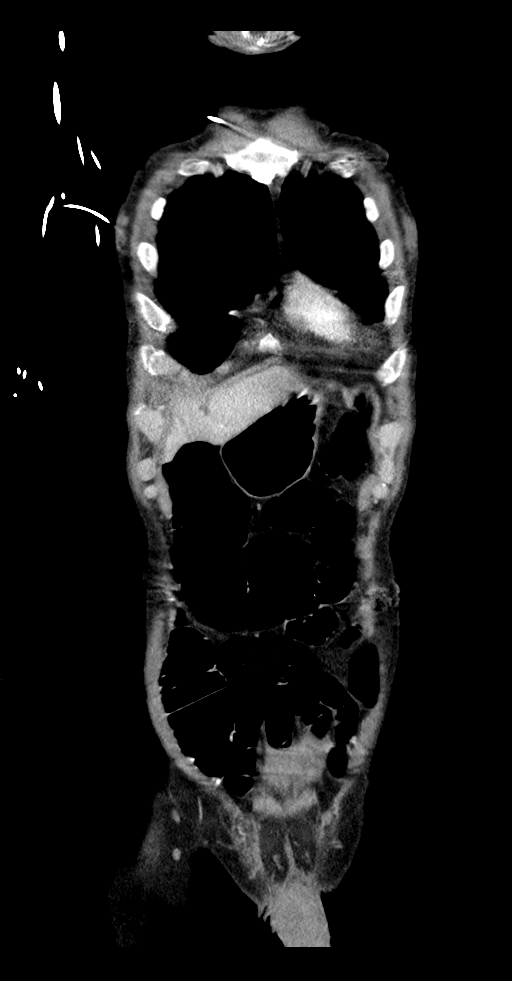
[im 48/120  mediastinal]
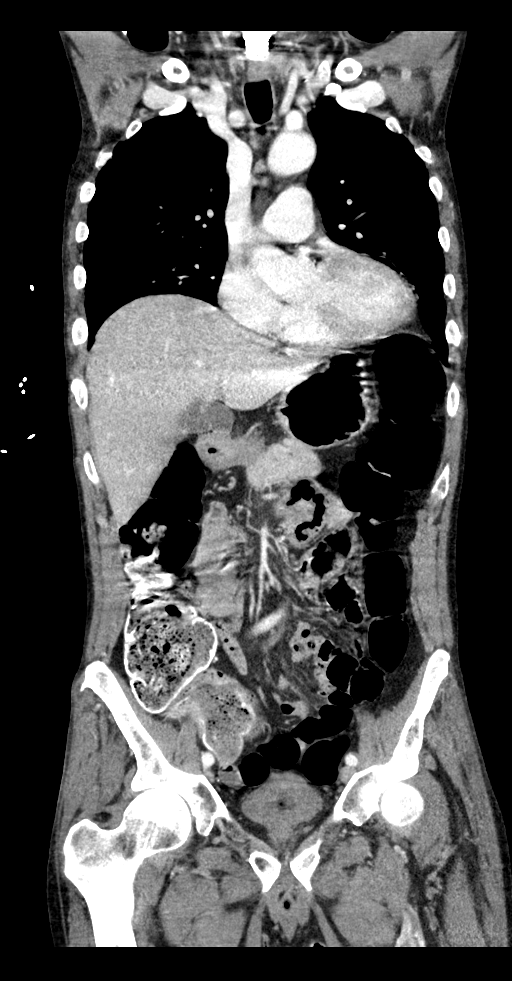
[im 72/120  mediastinal]
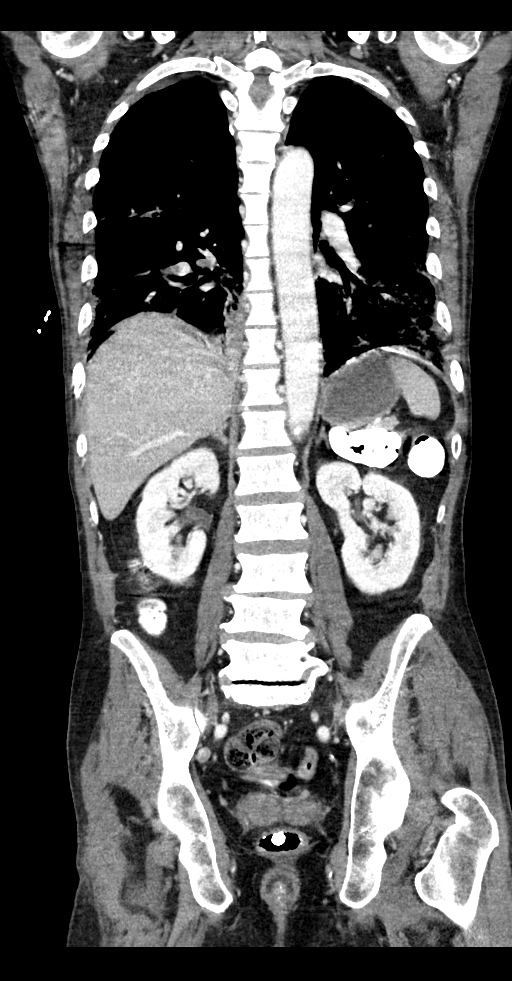

[13 of 36 positions shown; findings below may reference images not displayed]

RADIATION DOSE REDUCTION: This exam was performed according to the
departmental dose-optimization program which includes automated
exposure control, adjustment of the mA and/or kV according to
patient size and/or use of iterative reconstruction technique.

CONTRAST:  100mL OMNIPAQUE IOHEXOL 300 MG/ML  SOLN
FINDINGS: CT CHEST FINDINGS

Cardiovascular: No significant vascular findings. Normal heart size.
No pericardial effusion. There is good contrast opacification of
pulmonary artery branches. Segmental pulmonary emboli in right upper
lobe in bilateral lower lobes. Adequate contrast opacification of
the thoracic aorta with no evidence of dissection, aneurysm, or
stenosis. There is classic 3-vessel brachiocephalic arch anatomy
without proximal stenosis.

Mediastinum/Nodes: No mass or adenopathy.

Lungs/Pleura: Trace right pleural effusion. No pneumothorax.
Pulmonary emphysema. Fairly extensive airspace disease posteriorly
in both lower lobes with air bronchograms.

Musculoskeletal: Cervical fixation hardware partially visualized. No
acute findings.

CT ABDOMEN PELVIS FINDINGS

Hepatobiliary: No focal liver abnormality is seen. No gallstones,
gallbladder wall thickening, or biliary dilatation.

Pancreas: Unremarkable. No pancreatic ductal dilatation or
surrounding inflammatory changes.

Spleen: Normal in size without focal abnormality.

Adrenals/Urinary Tract: No adrenal mass. Kidneys unremarkable. Foley
catheter partially decompresses urinary bladder.

Stomach/Bowel: Stomach is partially distended, unremarkable. Small
bowel is nondilated. Hyperdense material in the nondilated appendix.
Moderate gas and fecal distention of the colon, relatively
decompressed distally. No evident bowel wall thickening or regional
inflammatory change.

Vascular/Lymphatic: Moderate scattered aortoiliac atheromatous
plaque without aneurysm or evident stenosis. No abdominal or pelvic
adenopathy.

Reproductive: Prostate enlargement with central coarse
calcifications.

Other: No ascites.  No free air.

Musculoskeletal: Lumbar spondylitic changes L3-S1.
IMPRESSION: 1. POSITIVE for bilateral segmental pulmonary emboli.
2. Bilateral lower lobe airspace disease with air bronchograms,
suggesting pneumonia.
Critical Value/emergent results were called by telephone at the time
of interpretation on [DATE] at [DATE] to provider SERGIO FELIPE ,
who verbally acknowledged these results.
3. No acute abdominal findings.
4.  Aortic Atherosclerosis ([0A]-170.0).

## 2021-07-14 MED ORDER — METOPROLOL TARTRATE 50 MG PO TABS
50.0000 mg | ORAL_TABLET | Freq: Two times a day (BID) | ORAL | Status: DC
  Administered 2021-07-15: 50 mg via ORAL
  Filled 2021-07-14: qty 1

## 2021-07-14 MED ORDER — DILTIAZEM HCL 25 MG/5ML IV SOLN
15.0000 mg | INTRAVENOUS | Status: AC
  Administered 2021-07-14: 15 mg via INTRAVENOUS
  Filled 2021-07-14: qty 5

## 2021-07-14 MED ORDER — VANCOMYCIN HCL IN DEXTROSE 1-5 GM/200ML-% IV SOLN
1000.0000 mg | Freq: Once | INTRAVENOUS | Status: DC
  Filled 2021-07-14: qty 200

## 2021-07-14 MED ORDER — SODIUM CHLORIDE 0.9 % IV SOLN
2.0000 g | Freq: Two times a day (BID) | INTRAVENOUS | Status: DC
  Administered 2021-07-14: 2 g via INTRAVENOUS
  Filled 2021-07-14: qty 2

## 2021-07-14 MED ORDER — SODIUM CHLORIDE 0.9 % IV SOLN: INTRAVENOUS | Status: AC

## 2021-07-14 MED ORDER — IOHEXOL 300 MG/ML  SOLN
100.0000 mL | Freq: Once | INTRAMUSCULAR | Status: AC | PRN
  Administered 2021-07-14: 100 mL via INTRAVENOUS

## 2021-07-14 MED ORDER — HEPARIN (PORCINE) 25000 UT/250ML-% IV SOLN
900.0000 [IU]/h | INTRAVENOUS | Status: DC
  Administered 2021-07-14: 900 [IU]/h via INTRAVENOUS
  Filled 2021-07-14: qty 250

## 2021-07-14 MED ORDER — DILTIAZEM HCL 60 MG PO TABS
30.0000 mg | ORAL_TABLET | Freq: Four times a day (QID) | ORAL | Status: DC | PRN
Start: 2021-07-14 — End: 2021-07-14
  Administered 2021-07-14: 30 mg via ORAL
  Filled 2021-07-14: qty 1

## 2021-07-14 MED ORDER — METRONIDAZOLE 500 MG/100ML IV SOLN
500.0000 mg | Freq: Two times a day (BID) | INTRAVENOUS | Status: DC
  Administered 2021-07-14 – 2021-07-15 (×3): 500 mg via INTRAVENOUS
  Filled 2021-07-14 (×3): qty 100

## 2021-07-14 MED ORDER — HEPARIN (PORCINE) 25000 UT/250ML-% IV SOLN
1500.0000 [IU]/h | INTRAVENOUS | Status: DC
  Administered 2021-07-15: 1300 [IU]/h via INTRAVENOUS
  Filled 2021-07-14: qty 250

## 2021-07-14 MED ORDER — SODIUM CHLORIDE 0.9 % IV SOLN
2.0000 g | Freq: Once | INTRAVENOUS | Status: AC
  Administered 2021-07-14: 2 g via INTRAVENOUS
  Filled 2021-07-14: qty 2

## 2021-07-14 MED ORDER — SODIUM CHLORIDE 0.9 % IV BOLUS
1000.0000 mL | Freq: Once | INTRAVENOUS | Status: AC
  Administered 2021-07-14: 1000 mL via INTRAVENOUS

## 2021-07-14 MED ORDER — METOPROLOL TARTRATE 5 MG/5ML IV SOLN
2.5000 mg | INTRAVENOUS | Status: DC | PRN
  Administered 2021-07-15 (×2): 2.5 mg via INTRAVENOUS
  Filled 2021-07-14 (×2): qty 5

## 2021-07-14 MED ORDER — VANCOMYCIN HCL 1250 MG/250ML IV SOLN
1250.0000 mg | INTRAVENOUS | Status: DC
  Administered 2021-07-14 – 2021-07-17 (×4): 1250 mg via INTRAVENOUS
  Filled 2021-07-14 (×4): qty 250

## 2021-07-14 NOTE — Assessment & Plan Note (Addendum)
-  N.p.o. pending reevaluation by SLP -Aspiration precautions

## 2021-07-14 NOTE — Progress Notes (Signed)
Subjective: Patient reports  overall feels about the same  Objective: Vital signs in last 24 hours: Temp:  [98.6 F (37 C)-99.8 F (37.7 C)] 98.6 F (37 C) (02/18 0310) Pulse Rate:  [93-140] 104 (02/18 0310) Resp:  [13-19] 17 (02/18 0310) BP: (103-131)/(64-88) 119/83 (02/18 0310) SpO2:  [90 %-100 %] 99 % (02/18 0310)  Intake/Output from previous day: 02/17 0701 - 02/18 0700 In: 220 [P.O.:220] Out: 750 [Urine:750] Intake/Output this shift: No intake/output data recorded.  Awake and alert stable neurologically  Lab Results: Recent Labs    07/13/21 1747 07/14/21 0542  WBC 18.2* 23.4*  HGB 10.3* 10.9*  HCT 31.5* 31.2*  PLT 396 410*   BMET Recent Labs    07/13/21 1747 07/14/21 0542  NA  --  128*  K  --  4.7  CL  --  94*  CO2  --  23  GLUCOSE  --  101*  BUN  --  24*  CREATININE 0.99 1.10  CALCIUM  --  9.4    Studies/Results: DG Chest Port 1 View  Result Date: 07/13/2021 CLINICAL DATA:  Shortness of breath EXAM: PORTABLE CHEST 1 VIEW COMPARISON:  Chest x-ray 07/10/2021 FINDINGS: Heart size and mediastinum are stable and within normal limits. Pulmonary vasculature is normal. No focal consolidation identified. Mildly low lung volumes with likely subsegmental atelectatic changes at the lung bases. No significant pleural effusion visualized. No pneumothorax. IMPRESSION: Low lung volumes with no acute process identified. Electronically Signed   By: Jannifer Hick M.D.   On: 07/13/2021 11:13    Assessment/Plan: Postop reexploration for epidural hematoma getting therapy awaiting rehab  LOS: 15 days     Mariam Dollar 07/14/2021, 8:53 AM

## 2021-07-14 NOTE — Assessment & Plan Note (Signed)
Suspect demand ischemia versus ACS.  EKG reassuring. -Check echocardiogram

## 2021-07-14 NOTE — Progress Notes (Signed)
ANTICOAGULATION CONSULT NOTE - Initial Consult  Pharmacy Consult for heparin Indication: pulmonary embolus  No Known Allergies  Patient Measurements: Height: 5\' 10"  (177.8 cm) Weight: 56.1 kg (123 lb 10.9 oz) IBW/kg (Calculated) : 73 Heparin Dosing Weight: TBW  Vital Signs: Temp: 101.6 F (38.7 C) (02/18 1215) Temp Source: Oral (02/18 1215) BP: 137/107 (02/18 1215) Pulse Rate: 159 (02/18 1215)  Labs: Recent Labs    07/13/21 1110 07/13/21 1314 07/13/21 1747 07/14/21 0542  HGB  --   --  10.3* 10.9*  HCT  --   --  31.5* 31.2*  PLT  --   --  396 410*  CREATININE  --   --  0.99 1.10  CKTOTAL  --   --   --  43*  TROPONINIHS 12 22* 137*  --     Estimated Creatinine Clearance: 56.7 mL/min (by C-G formula based on SCr of 1.1 mg/dL).   Medical History: History reviewed. No pertinent past medical history.  Medications:  Medications Prior to Admission  Medication Sig Dispense Refill Last Dose   naproxen sodium (ALEVE) 220 MG tablet Take 440 mg by mouth 2 (two) times daily as needed (pain.).   Past Week   methylPREDNISolone (MEDROL DOSEPAK) 4 MG TBPK tablet Take as prescribed (Patient not taking: Reported on 06/27/2021) 1 each 0 Not Taking    Assessment: 16 yom s/p ACDF then exploration and evacuation of epidural hematoma. He is on antibiotics for PNA. Pharmacy consulted to begin IV heparin for bilateral PE on CTA. No boluses per discussion with Dr Cyndia Skeeters. No bleeding noted, Hgb low stable 10-11s, platelets are normal.  Goal of Therapy:  Heparin level 0.3-0.7 units/ml Monitor platelets by anticoagulation protocol: Yes   Plan:  Begin IV heparin at 900 units/hr with no bolus 6 hr heparin level Daily heparin level and CBC Monitor for s/sx of bleeding   Thank you for involving pharmacy in this patient's care.  Renold Genta, PharmD, BCPS Clinical Pharmacist Clinical phone for 07/14/2021 until 3p is [phone] 07/14/2021 12:20 PM  **Pharmacist phone directory can be found  on [email or web address] listed under [place]**

## 2021-07-14 NOTE — Progress Notes (Signed)
PROGRESS NOTE  Brian [name] [id number] DOB: 1960/10/11   PCP: Pcp, No  Patient is from: Home  DOA: 06/29/2021 LOS: 15  Chief complaints:  No chief complaint on file.    Brief Narrative / Interim history: 61 year old M with PMH of tobacco and EtOH use disorder who was admitted by neurosurgery for cervical myelopathy and underwent ACDF on 2/3.  Postop course complicated by strokelike symptoms, encephalopathy and prevertebral soft tissue swelling with foci of gas at the ventral epidural space.  He underwent exploration of cervical fusion with evacuation of the epidural hematoma on 2/7.  He was also started on CIWA on 2/11.   Hospital course complicated by delirium/encephalopathy, aspiration pneumonia.  Work-up for acute encephalopathy and CVA unrevealing.  Neurology signed off.  He is currently on dysphagia 2 diet.   Patient with recurrent aspiration pneumonia.  Also found to bilateral multisegmental PE.  Ongoing confusion with agitation and sinus tachycardia.  Now on broad-spectrum antibiotics, IV heparin and IV fluid.     Subjective: Seen and examined this afternoon.  Patient is somewhat confused and agitated.  He denies pain, shortness of breath, abdominal pain, nausea or vomiting although he appears to be very uncomfortable and restless.  He kept on saying " come on now" but not able to tell me what is bothering him.  Tachycardic to 140s.  Blood pressure within normal.  92% on 2 L.  Somewhat tachypneic as well  Objective: Vitals:   07/13/21 2305 07/14/21 0310 07/14/21 1215 07/14/21 1231  BP: 104/76 119/83 (!) 137/107 131/87  Pulse: 96 (!) 104 (!) 159 (!) 142  Resp: 13 17 (!) 21 (!) 26  Temp: 98.6 F (37 C) 98.6 F (37 C) (!) 101.6 F (38.7 C)   TempSrc: Oral Oral Oral   SpO2: 93% 99% 95% 90%  Weight:      Height:        Examination: GENERAL: Frail and chronically ill-appearing.  Somewhat restless HEENT: MMM.  Vision and hearing grossly intact.  NECK: No apparent  JVD.  RESP: Increased HR to 20s.  92% on 2 L.  No IWOB.  Fair aeration bilaterally. CVS:  RRR. Heart sounds normal.  ABD/GI/GU: BS+. Abd soft, NTND.  MSK/EXT:  Moves extremities.  Significant muscle mass and subcu fat loss. SKIN: no apparent skin lesion or wound NEURO: Awake and alert.  Not cooperative to assess orientation.  Seems to move all extremities. PSYCH: Confused and agitated.  Procedures:  2/3-ACDF 2/7-cervical epidural hematoma evacuation   Assessment and Plan: * Cervical myelopathy ([facility])- (present on admission) S/p ACDF on 2/3 complicated by epidural hematoma s/p evacuation on 2/7 -Per primary.  Acute pulmonary embolism ([place]) Noted on CTA chest.   -Started IV heparin without bolus. -Check echocardiogram  Sepsis due to pneumonia [facility]) This seems to be recurrent issue.  Recently completed antibiotic course.  Now with fever, tachycardia, tachypnea and significant leukocytosis.  CTA chest/abdomen/pelvis with bilateral segmental PE and BLL airspace disease with air bronchograms.  Lactic acid within normal.  Procalcitonin markedly elevated. -Started broad-spectrum antibiotics with Vanco, cefepime and Flagyl -Normal saline bolus 1 L -Follow blood and urine cultures -Evaluated by SLP who recommended continuing dysphagia 2 diet -Aspiration precaution  Acute metabolic encephalopathy CT head, MRI brain, EEG, TSH, B12, ammonia unrevealing.  Some concern about alcohol withdrawal symptoms at some point.  Now confused, agitated and angry in the setting of sepsis due to pneumonia -Treat sepsis as below -Changed Seroquel to olanzapine -Reorientation and delirium precautions  Sinus tachycardia HR as high as 160.  Improved to 140s after IV Cardizem push.  Tachycardia is multifactorial including agitation, sepsis, PE, dehydration and possible out of anomic dysregulation.  His recent TSH was within normal. -Treat treatable causes-sepsis, PE and agitation -Continue metoprolol 25 mg  twice daily -P.o. Cardizem as needed -NS bolus 1 L x 1 -Check echocardiogram  Protein-calorie malnutrition, severe ([place]) As evidenced by poor p.o. intake, significant muscle mass and subcu fat loss and significant weight loss (about 16 pounds since this hospitalization). Nutrition Problem: Severe Malnutrition (in the context of social/environmental circumstances) Etiology:  (inadequate energy intake) Signs/Symptoms: mild fat depletion, severe muscle depletion, severe muscle depletion Interventions: Refer to RD note for recommendations  May need assistance with feeding.  Dysphagia -On dysphagia-2 diet per SLP -Aspiration precautions  Elevated troponin Suspect demand ischemia versus ACS.  EKG reassuring. -Check echocardiogram  Bandemia Likely due to sepsis. -Management as above -Continue monitoring  Chest pain at rest Multifactorial including musculoskeletal chest pain (somewhat reproducible), segmental PE and pneumonia.  EKG without acute ischemic finding.  Troponin trended from 11>> 137 likely demand ischemia from tachycardia and sepsis.  -Treat tachycardia and sepsis as above -Check echocardiogram -Continue p.o. Protonix and GI cocktail as needed -Pain medication with bowel regimen  Hyponatremia Likely from dehydration but could be SIADH as well. -Check urine chemistry -NS bolus as above  Physical deconditioning Significant weakness in all extremities as a result of cervical myelopathy and acute illness. -Continue PT/OT  Elevated liver enzymes Resolving.  Tobacco use disorder Encouraged cessation. Nicotine patch  Hyperglycemia-resolved as of 07/12/2021 Likely due to steroid.  Resolved.  A1c 5.2%.   DVT prophylaxis:  SCD's Start: 06/29/21 1231  Code Status: Full code Family Communication: Updated patient's son over the phone. Level of care: Progressive Status is: Inpatient  Disposition: Per primary.     Sch Meds:  Scheduled Meds:  Chlorhexidine  Gluconate Cloth  6 each Topical Daily   feeding supplement  237 mL Oral BID BM   mouth rinse  15 mL Mouth Rinse q12n4p   melatonin  3 mg Oral QHS   metoprolol tartrate  25 mg Oral BID   multivitamin with minerals  1 tablet Oral Daily   OLANZapine  5 mg Oral BID   pantoprazole  40 mg Oral Daily   senna-docusate  1 tablet Per Tube BID   sodium chloride flush  3 mL Intravenous Q12H   tamsulosin  0.4 mg Oral Daily   Continuous Infusions:  sodium chloride 250 mL (06/29/21 1316)   ceFEPime (MAXIPIME) IV     heparin 900 Units/hr (07/14/21 1315)   metronidazole 500 mg (07/14/21 1250)   sodium chloride     vancomycin 1,250 mg (07/14/21 1259)   PRN Meds:.acetaminophen **OR** acetaminophen, alum & mag hydroxide-simeth, bisacodyl, diltiazem, HYDROcodone-acetaminophen, HYDROcodone-acetaminophen, HYDROmorphone (DILAUDID) injection, menthol-cetylpyridinium **OR** phenol, ondansetron **OR** ondansetron (ZOFRAN) IV, sodium chloride flush, sodium phosphate, white petrolatum  Antimicrobials: Anti-infectives (From admission, onward)    Start     Dose/Rate Route Frequency Ordered Stop   07/14/21 2200  ceFEPIme (MAXIPIME) 2 g in sodium chloride 0.9 % 100 mL IVPB        2 g 200 mL/hr over 30 Minutes Intravenous Every 12 hours 07/14/21 0856     07/14/21 1000  metroNIDAZOLE (FLAGYL) IVPB 500 mg        500 mg 100 mL/hr over 60 Minutes Intravenous Every 12 hours 07/14/21 0806     07/14/21 1000  vancomycin (VANCOREADY) IVPB 1250  mg/250 mL        1,250 mg 166.7 mL/hr over 90 Minutes Intravenous Every 24 hours 07/14/21 0852     07/14/21 0900  vancomycin (VANCOCIN) IVPB 1000 mg/200 mL premix  Status:  Discontinued        1,000 mg 200 mL/hr over 60 Minutes Intravenous  Once 07/14/21 0806 07/14/21 0852   07/14/21 0830  ceFEPIme (MAXIPIME) 2 g in sodium chloride 0.9 % 100 mL IVPB        2 g 200 mL/hr over 30 Minutes Intravenous  Once 07/14/21 0806 07/14/21 1217   07/04/21 1500  Ampicillin-Sulbactam (UNASYN) 3  g in sodium chloride 0.9 % 100 mL IVPB  Status:  Discontinued        3 g 200 mL/hr over 30 Minutes Intravenous Every 8 hours 07/04/21 0806 07/07/21 1009   07/04/21 0100  vancomycin (VANCOCIN) IVPB 1000 mg/200 mL premix  Status:  Discontinued        1,000 mg 200 mL/hr over 60 Minutes Intravenous Every 12 hours 07/03/21 1105 07/04/21 0750   07/03/21 1522  vancomycin (VANCOCIN) powder  Status:  Discontinued          As needed 07/03/21 1524 07/03/21 1549   07/03/21 1200  metroNIDAZOLE (FLAGYL) IVPB 500 mg  Status:  Discontinued        500 mg 100 mL/hr over 60 Minutes Intravenous 2 times daily 07/03/21 1100 07/03/21 1107   07/03/21 1200  ceFEPIme (MAXIPIME) 2 g in sodium chloride 0.9 % 100 mL IVPB  Status:  Discontinued        2 g 200 mL/hr over 30 Minutes Intravenous Every 8 hours 07/03/21 1102 07/04/21 0750   07/03/21 1200  metroNIDAZOLE (FLAGYL) IVPB 500 mg  Status:  Discontinued        500 mg 100 mL/hr over 60 Minutes Intravenous Every 8 hours 07/03/21 1107 07/04/21 0750   07/03/21 1145  vancomycin (VANCOREADY) IVPB 1500 mg/300 mL        1,500 mg 150 mL/hr over 120 Minutes Intravenous  Once 07/03/21 1100 07/03/21 1607   07/03/21 1045  Ampicillin-Sulbactam (UNASYN) 3 g in sodium chloride 0.9 % 100 mL IVPB  Status:  Discontinued        3 g 200 mL/hr over 30 Minutes Intravenous Every 6 hours 07/03/21 0957 07/03/21 1100   06/29/21 1215  ceFAZolin (ANCEF) IVPB 1 g/50 mL premix        1 g 100 mL/hr over 30 Minutes Intravenous Every 8 hours 06/29/21 1205 06/29/21 1932   06/29/21 0800  ceFAZolin (ANCEF) IVPB 2g/100 mL premix        2 g 200 mL/hr over 30 Minutes Intravenous On call to O.R. 06/29/21 [id number] 06/29/21 0806   06/29/21 0751  ceFAZolin (ANCEF) 2-4 GM/100ML-% IVPB       Note to Pharmacy: Barbie Haggis N: cabinet override      06/29/21 0751 06/29/21 0819        I have personally reviewed the following labs and images: CBC: Recent Labs  Lab 07/07/21 1913 07/08/21 0454  07/09/21 0649 07/10/21 0603 07/11/21 0503 07/13/21 1747 07/14/21 0542  WBC 13.4*   < > 9.6 7.4 7.0 18.2* 23.4*  NEUTROABS 9.4*  --   --   --   --   --  18.2*  HGB 11.0*   < > 10.6* 11.0* 11.3* 10.3* 10.9*  HCT 31.9*   < > 30.6* 31.4* 32.8* 31.5* 31.2*  MCV 97.0   < > 97.5 96.3 97.3  98.1 96.0  PLT 414*   < > 470* 501* 465* 396 410*   < > = values in this interval not displayed.   BMP &GFR Recent Labs  Lab 07/07/21 1913 07/08/21 0454 07/09/21 0649 07/10/21 0603 07/11/21 0503 07/13/21 1747 07/14/21 0542  NA 133* 131* 130* 133* 132*  --  128*  K 3.7 3.6 3.9 3.9 3.7  --  4.7  CL 101 98 97* 100 99  --  94*  CO2 23 23 22 23 25   --  23  GLUCOSE 100* 95 92 109* 113*  --  101*  BUN 15 17 16  22* 23*  --  24*  CREATININE 0.70 0.76 0.81 0.98 1.07 0.99 1.10  CALCIUM 9.4 9.5 9.5 10.3 9.8  --  9.4  MG 1.8  --   --  2.1  --   --  2.1  PHOS 3.0  --   --  4.3  --   --  4.5   Estimated Creatinine Clearance: 56.7 mL/min (by C-G formula based on SCr of 1.1 mg/dL). Liver & Pancreas: Recent Labs  Lab 07/08/21 0454 07/09/21 0649 07/10/21 0603 07/11/21 0503 07/14/21 0542  AST 153* 102* 85* 65* 36  ALT 213* 189* 154* 128* 55*  ALKPHOS 97 95 92 93 116  BILITOT 1.0 1.2 0.9 0.8 1.5*  PROT 7.7 7.8 8.0 7.8 8.2*  ALBUMIN 2.8* 2.9* 2.9* 2.9* 2.6*   No results for input(s): LIPASE, AMYLASE in the last 168 hours. Recent Labs  Lab 07/07/21 1913  AMMONIA 34   Diabetic: No results for input(s): HGBA1C in the last 72 hours. Recent Labs  Lab 07/12/21 2331 07/13/21 0741 07/13/21 1526 07/13/21 2341 07/14/21 0858  GLUCAP 118* 106* 123* 110* 110*   Cardiac Enzymes: Recent Labs  Lab 07/14/21 0542  CKTOTAL 43*   No results for input(s): PROBNP in the last 8760 hours. Coagulation Profile: No results for input(s): INR, PROTIME in the last 168 hours. Thyroid Function Tests: No results for input(s): TSH, T4TOTAL, FREET4, T3FREE, THYROIDAB in the last 72 hours. Lipid Profile: No results  for input(s): CHOL, HDL, LDLCALC, TRIG, CHOLHDL, LDLDIRECT in the last 72 hours. Anemia Panel: Recent Labs    07/14/21 0542  VITAMINB12 549  FOLATE 26.2  FERRITIN 719*  TIBC 203*  IRON 13*  RETICCTPCT 1.6   Urine analysis:    Component Value Date/Time   COLORURINE AMBER (A) 07/13/2021 1845   APPEARANCEUR HAZY (A) 07/13/2021 1845   LABSPEC 1.019 07/13/2021 1845   PHURINE 5.0 07/13/2021 1845   GLUCOSEU NEGATIVE 07/13/2021 1845   HGBUR LARGE (A) 07/13/2021 1845   BILIRUBINUR NEGATIVE 07/13/2021 1845   KETONESUR NEGATIVE 07/13/2021 1845   PROTEINUR 30 (A) 07/13/2021 1845   NITRITE NEGATIVE 07/13/2021 1845   LEUKOCYTESUR NEGATIVE 07/13/2021 1845   Sepsis Labs: Invalid input(s): PROCALCITONIN, [place]  Microbiology: No results found for this or any previous visit (from the past 240 hour(s)).   Radiology Studies: CT CHEST ABDOMEN PELVIS W CONTRAST  Result Date: 07/14/2021 CLINICAL DATA:  Sepsis, evaluate source EXAM: CT CHEST, ABDOMEN, AND PELVIS WITH CONTRAST TECHNIQUE: Multidetector CT imaging of the chest, abdomen and pelvis was performed following the standard protocol during bolus administration of intravenous contrast. RADIATION DOSE REDUCTION: This exam was performed according to the departmental dose-optimization program which includes automated exposure control, adjustment of the mA and/or kV according to patient size and/or use of iterative reconstruction technique. CONTRAST:  175mL OMNIPAQUE IOHEXOL 300 MG/ML  SOLN COMPARISON:  None available FINDINGS:  CT CHEST FINDINGS Cardiovascular: No significant vascular findings. Normal heart size. No pericardial effusion. There is good contrast opacification of pulmonary artery branches. Segmental pulmonary emboli in right upper lobe in bilateral lower lobes. Adequate contrast opacification of the thoracic aorta with no evidence of dissection, aneurysm, or stenosis. There is classic 3-vessel brachiocephalic arch anatomy without  proximal stenosis. Mediastinum/Nodes: No mass or adenopathy. Lungs/Pleura: Trace right pleural effusion. No pneumothorax. Pulmonary emphysema. Fairly extensive airspace disease posteriorly in both lower lobes with air bronchograms. Musculoskeletal: Cervical fixation hardware partially visualized. No acute findings. CT ABDOMEN PELVIS FINDINGS Hepatobiliary: No focal liver abnormality is seen. No gallstones, gallbladder wall thickening, or biliary dilatation. Pancreas: Unremarkable. No pancreatic ductal dilatation or surrounding inflammatory changes. Spleen: Normal in size without focal abnormality. Adrenals/Urinary Tract: No adrenal mass. Kidneys unremarkable. Foley catheter partially decompresses urinary bladder. Stomach/Bowel: Stomach is partially distended, unremarkable. Small bowel is nondilated. Hyperdense material in the nondilated appendix. Moderate gas and fecal distention of the colon, relatively decompressed distally. No evident bowel wall thickening or regional inflammatory change. Vascular/Lymphatic: Moderate scattered aortoiliac atheromatous plaque without aneurysm or evident stenosis. No abdominal or pelvic adenopathy. Reproductive: Prostate enlargement with central coarse calcifications. Other: No ascites.  No free air. Musculoskeletal: Lumbar spondylitic changes L3-S1. IMPRESSION: 1. POSITIVE for bilateral segmental pulmonary emboli. 2. Bilateral lower lobe airspace disease with air bronchograms, suggesting pneumonia. Critical Value/emergent results were called by telephone at the time of interpretation on 07/14/2021 at 11:14 am to provider [facility] , who verbally acknowledged these results. 3. No acute abdominal findings. 4.  Aortic Atherosclerosis (ICD10-170.0). Electronically Signed   By: Lucrezia Europe M.D.   On: 07/14/2021 11:15       Aloura Matsuoka T. [place]  If 7PM-7AM, please contact night-coverage [email or web address] 07/14/2021, 1:30 PM

## 2021-07-14 NOTE — Progress Notes (Signed)
Pharmacy Antibiotic Note  Brian [name] is a 61 y.o. male admitted on 06/29/2021 s/p ACDF and then exploration cervical fusion and evac epidural hematoma. Pharmacy has been consulted for vancomycin/cefepime dosing for HCAP.  He is afebrile with Tm 99.8, WBC up to 23.4, PCT up to 8.61, and Scr up to 1.1. Blood and urine culture pending.  Plan: Vancomycin 1250 mg IV q24h Goal AUC 400-550, eAUC: 471.7, SCr used: 1.1  Cefepime 2 g IV q12h Metronidazole 500 mg IV q12h per MD Monitor renal function, clinical progress, cultures/sensitivities F/U LOT and de-escalate as able Vancomycin levels as clinically indicated   Height: 5\' 10"  (177.8 cm) Weight: 56.1 kg (123 lb 10.9 oz) IBW/kg (Calculated) : 73  Temp (24hrs), Avg:99 F (37.2 C), Min:98.6 F (37 C), Max:99.8 F (37.7 C)  Recent Labs  Lab 07/07/21 1913 07/08/21 0454 07/09/21 0649 07/10/21 0603 07/11/21 0503 07/13/21 1747 07/14/21 0542  WBC 13.4*   < > 9.6 7.4 7.0 18.2* 23.4*  CREATININE 0.70   < > 0.81 0.98 1.07 0.99 1.10  LATICACIDVEN 1.2  --   --   --   --   --  1.1   < > = values in this interval not displayed.     Estimated Creatinine Clearance: 56.7 mL/min (by C-G formula based on SCr of 1.1 mg/dL).    No Known Allergies  Vancomycin 2/7>>2/8, 2/18>> Cefepime 2/7>>2/8, 2/18>> Metronidazole 2/7>>2/8, 2/18>> Unasyn 2/8>>2/11   2/7 BCx: neg 2/7 UCx: neg 2/18 BCx:  2/18 UCx:  Thank you for involving pharmacy in this patient's care.  Renold Genta, PharmD, BCPS Clinical Pharmacist Clinical phone for 07/14/2021 until 3p is [phone] 07/14/2021 8:10 AM  **Pharmacist phone directory can be found on [email or web address] listed under [place]**

## 2021-07-14 NOTE — Assessment & Plan Note (Signed)
Likely due to sepsis. -Management as above -Continue monitoring

## 2021-07-14 NOTE — Assessment & Plan Note (Addendum)
Likely recurrent issue during hospitalization, remains n.p.o. due to continued aspiration risk per SLP.  Completed extensive course of antibiotics while under ICU care. S/p IR gtube 3/6. --Continue tube feeds --Continue aspiration precautions --Continue SLP efforts while inpatient

## 2021-07-14 NOTE — Progress Notes (Addendum)
Nighttime medications were crushed and fed to patient in applesauce; When RN reentered the room after getting additonal supplies pt had vomitted medicaiton/applesauce back up and on the covers.

## 2021-07-14 NOTE — Assessment & Plan Note (Addendum)
-  CTA chest showed bilateral segmental PE.  -LE [place] negative for DVT.   -TTE with normal LV systolic function.  Supplemental oxygen weaned off. -Continue Eliquis 5 mg twice daily.   -Outpatient follow-up.

## 2021-07-14 NOTE — Progress Notes (Signed)
ANTICOAGULATION CONSULT NOTE  Pharmacy Consult for heparin Indication: pulmonary embolus  No Known Allergies  Patient Measurements: Height: 5\' 10"  (177.8 cm) Weight: 56.1 kg (123 lb 10.9 oz) IBW/kg (Calculated) : 73 Heparin Dosing Weight: TBW  Vital Signs: Temp: 99.2 F (37.3 C) (02/18 1935) Temp Source: Oral (02/18 1935) BP: 142/90 (02/18 2111) Pulse Rate: 146 (02/18 2111)  Labs: Recent Labs    07/13/21 1110 07/13/21 1314 07/13/21 1747 07/14/21 0542 07/14/21 1929  HGB  --   --  10.3* 10.9*  --   HCT  --   --  31.5* 31.2*  --   PLT  --   --  396 410*  --   HEPARINUNFRC  --   --   --   --  <0.10*  CREATININE  --   --  0.99 1.10  --   CKTOTAL  --   --   --  43*  --   TROPONINIHS 12 22* 137*  --   --      Estimated Creatinine Clearance: 56.7 mL/min (by C-G formula based on SCr of 1.1 mg/dL).   Medical History: History reviewed. No pertinent past medical history.  Medications:  Medications Prior to Admission  Medication Sig Dispense Refill Last Dose   naproxen sodium (ALEVE) 220 MG tablet Take 440 mg by mouth 2 (two) times daily as needed (pain.).   Past Week   methylPREDNISolone (MEDROL DOSEPAK) 4 MG TBPK tablet Take as prescribed (Patient not taking: Reported on 06/27/2021) 1 each 0 Not Taking    Assessment: 60 yom s/p ACDF then exploration and evacuation of epidural hematoma. He is on antibiotics for PNA. Pharmacy consulted to begin IV heparin for bilateral PE on CTA. No boluses per discussion with Dr 08/25/2021.   Goal of Therapy:  Heparin level 0.3-0.7 units/ml Monitor platelets by anticoagulation protocol: Yes   Plan:  -Increase heparin to 1100 units/hr -Heparin level in 6 hours and daily wth CBC daily  Alanda Slim, PharmD Clinical Pharmacist **Pharmacist phone directory can now be found on [email or web address] ([email or web address]).  Listed under [facility].

## 2021-07-14 NOTE — Progress Notes (Signed)
Occupational Therapy Treatment Patient Details Name: Brian [name] MRN: [id number] DOB: 1960-11-13 Today's Date: 07/14/2021   History of present illness Pt is a 61 y/o male admitted 06/29/21 following C3-7 ACDF after progressive cervical myelopathy symptoms including ataxia, bil UE and LE shaking and weakness, and loss of function in his hands with subsequent fall. Pt initially had improvement in neurological symptoms, but postoperative course was complicated by dysphasia and ataxia secondary to and epidural hematoma with evacuation of hematoma on 2/7. Pt also experienced delirium secondary to encephalopathy. He experienced aspiration pneumonia with subsequent sepsis while in the hospital. PMHx includes tobacco and EtOH use disorder.   OT comments  Pt. Seen for skilled OT treatment session.  Focus of session was initiation of self feeding.  Session limited as pt. Refusing attempts with beverages and food items on his b.fast tray.  Only wanting to drink and insistent that therapist asst. Hold the cup for him. Agitation and confusion main limiting factors this day.  Spoke with RN throughout session to make aware of patients presentation and his concerns.     Recommendations for follow up therapy are one component of a multi-disciplinary discharge planning process, led by the attending physician.  Recommendations may be updated based on patient status, additional functional criteria and insurance authorization.    Follow Up Recommendations  Skilled nursing-short term rehab (<3 hours/day)    Assistance Recommended at Discharge Frequent or constant Supervision/Assistance  Patient can return home with the following  Two people to help with walking and/or transfers;A lot of help with bathing/dressing/bathroom;Direct supervision/assist for medications management;Assist for transportation;Help with stairs or ramp for entrance;Assistance with feeding   Equipment Recommendations  Wheelchair (measurements  OT);Wheelchair cushion (measurements OT);Hospital bed;BSC/3in1;Tub/shower seat    Recommendations for Other Services Rehab consult    Precautions / Restrictions Precautions Precautions: Fall;Cervical Precaution Comments: Verbally reviewed precautions Required Braces or Orthoses: Cervical Brace Cervical Brace: Soft collar;At all times       Mobility Bed Mobility                    Transfers                         Balance                                           ADL either performed or assessed with clinical judgement   ADL Overall ADL's : Needs assistance/impaired Eating/Feeding: Total assistance Eating/Feeding Details (indicate cue type and reason): pt. yelling constantly for beverages "im dehydrated yall dont understand im trying to swallow AND I CANT".  growing frustation because he is nectar thick and i was trying to make these beverages for him.  he declined the water i made and screamed that "im not drinking that its hot i want ice".  attempted to explain ice would change the thickness.  2nd attempt with orange juice. tolerated this more.  refusal to attempt to hold cup with multiple attempts to engage his ues.  refusal to try any item on his tray.  finally agreed to a pudding type item.  2 bites, refusal to hold utensil or engage in any self feeding tasks. yelling throughout session.  unable to redirect with multiple attempts.  General ADL Comments: poor particiaption in self feeding attempts secondary to agitation and persevaration on "dehydration" but limited in my abilities to address and assist him with his concerns.  unable to redirect or comfort.    Extremity/Trunk Assessment              Vision       Perception     Praxis      Cognition Arousal/Alertness: Awake/alert Behavior During Therapy: Agitated, Anxious Overall Cognitive Status: Impaired/Different from  baseline Area of Impairment: Attention, Orientation, Awareness                               General Comments: cont. to say "im at [facility]" it sounded like he was stating it so i didnt understand and he would yell and say "im at [place]", finally he included that he was asking. i attempted to explain it didnt sound like a question which is why i had not answered. he was fluctuating with yelling and acusing me of many things, also began to state "we" were lying and he was not in room 5 in [facility] on the 4th floor.  multiple attempts to answer questions, clarify, and comfort pt. wtihout any progress.  rn made aware of all events and comments/questions from pt. during session        Exercises      Shoulder Instructions       General Comments      Pertinent Vitals/ Pain       Pain Assessment Pain Assessment: Faces Pain Location: issues with swallowing, and some chest pain Pain Descriptors / Indicators: Grimacing Pain Intervention(s): Limited activity within patient's tolerance, Monitored during session, Other (comment) (notified rn of elevated HR and yelling out)  Home Living                                          Prior Functioning/Environment              Frequency  Min 2X/week        Progress Toward Goals  OT Goals(current goals can now be found in the care plan section)  Progress towards OT goals: Progressing toward goals     Plan Discharge plan remains appropriate    Co-evaluation                 AM-PAC OT "6 Clicks" Daily Activity     Outcome Measure   Help from another person eating meals?: A Lot Help from another person taking care of personal grooming?: A Lot Help from another person toileting, which includes using toliet, bedpan, or urinal?: Total Help from another person bathing (including washing, rinsing, drying)?: A Lot Help from another person to put on and taking off regular upper body clothing?: A  Lot Help from another person to put on and taking off regular lower body clothing?: Total 6 Click Score: 10    End of Session Equipment Utilized During Treatment: Cervical collar  OT Visit Diagnosis: Unsteadiness on feet (R26.81);Muscle weakness (generalized) (M62.81);Ataxia, unspecified (R27.0);Feeding difficulties (R63.3);Pain;Other abnormalities of gait and mobility (R26.89)   Activity Tolerance Treatment limited secondary to agitation   Patient Left in bed;with call bell/phone within reach   Nurse Communication Other (comment) (rn notified multiple times of interactions with pt. and his concerns.  also spoke to get diet  clarification and to cont. with nectar thick.  reviewed elevated HR 150s and intermittent c/o chest pains.)        Time: [id number] OT Time Calculation (min): 18 min  Charges: OT General Charges $OT Visit: 1 Visit OT Treatments $Self Care/Home Management : 8-22 mins  Sonia Baller, COTA/L Acute Rehabilitation [phone]   Tanya Nones 07/14/2021, 10:12 AM

## 2021-07-15 ENCOUNTER — Inpatient Hospital Stay (HOSPITAL_COMMUNITY): Payer: 59

## 2021-07-15 ENCOUNTER — Inpatient Hospital Stay (HOSPITAL_COMMUNITY): Payer: Managed Care, Other (non HMO)

## 2021-07-15 DIAGNOSIS — J690 Pneumonitis due to inhalation of food and vomit: Secondary | ICD-10-CM

## 2021-07-15 DIAGNOSIS — J9601 Acute respiratory failure with hypoxia: Secondary | ICD-10-CM | POA: Diagnosis not present

## 2021-07-15 DIAGNOSIS — I2699 Other pulmonary embolism without acute cor pulmonale: Secondary | ICD-10-CM

## 2021-07-15 DIAGNOSIS — E440 Moderate protein-calorie malnutrition: Secondary | ICD-10-CM | POA: Insufficient documentation

## 2021-07-15 DIAGNOSIS — G959 Disease of spinal cord, unspecified: Secondary | ICD-10-CM | POA: Diagnosis not present

## 2021-07-15 DIAGNOSIS — R6520 Severe sepsis without septic shock: Secondary | ICD-10-CM

## 2021-07-15 DIAGNOSIS — K5900 Constipation, unspecified: Secondary | ICD-10-CM

## 2021-07-15 DIAGNOSIS — G9341 Metabolic encephalopathy: Secondary | ICD-10-CM | POA: Diagnosis not present

## 2021-07-15 DIAGNOSIS — A419 Sepsis, unspecified organism: Secondary | ICD-10-CM | POA: Diagnosis not present

## 2021-07-15 LAB — POCT I-STAT 7, (LYTES, BLD GAS, ICA,H+H)
Acid-base deficit: 1 mmol/L (ref 0.0–2.0)
Bicarbonate: 23.2 mmol/L (ref 20.0–28.0)
Calcium, Ion: 1.31 mmol/L (ref 1.15–1.40)
HCT: 29 % — ABNORMAL LOW (ref 39.0–52.0)
Hemoglobin: 9.9 g/dL — ABNORMAL LOW (ref 13.0–17.0)
O2 Saturation: 96 %
Patient temperature: 98.3
Potassium: 3.5 mmol/L (ref 3.5–5.1)
Sodium: 137 mmol/L (ref 135–145)
TCO2: 24 mmol/L (ref 22–32)
pCO2 arterial: 37.8 mmHg (ref 32–48)
pH, Arterial: 7.396 (ref 7.35–7.45)
pO2, Arterial: 84 mmHg (ref 83–108)

## 2021-07-15 LAB — RENAL FUNCTION PANEL
Albumin: 1.9 g/dL — ABNORMAL LOW (ref 3.5–5.0)
Anion gap: 6 (ref 5–15)
BUN: 24 mg/dL — ABNORMAL HIGH (ref 6–20)
CO2: 22 mmol/L (ref 22–32)
Calcium: 8.4 mg/dL — ABNORMAL LOW (ref 8.9–10.3)
Chloride: 103 mmol/L (ref 98–111)
Creatinine, Ser: 0.96 mg/dL (ref 0.61–1.24)
GFR, Estimated: >60 mL/min (ref >60)
Glucose, Bld: 106 mg/dL — ABNORMAL HIGH (ref 70–99)
Phosphorus: 3.1 mg/dL (ref 2.5–4.6)
Potassium: 4.9 mmol/L (ref 3.5–5.1)
Sodium: 131 mmol/L — ABNORMAL LOW (ref 135–145)

## 2021-07-15 LAB — PROCALCITONIN: Procalcitonin: 34.62 ng/mL

## 2021-07-15 LAB — URINE CULTURE: Culture: NO GROWTH

## 2021-07-15 LAB — AMMONIA: Ammonia: 41 umol/L — ABNORMAL HIGH (ref 9–35)

## 2021-07-15 LAB — CBC
HCT: 27.4 % — ABNORMAL LOW (ref 39.0–52.0)
Hemoglobin: 9.2 g/dL — ABNORMAL LOW (ref 13.0–17.0)
MCH: 33.2 pg (ref 26.0–34.0)
MCHC: 33.6 g/dL (ref 30.0–36.0)
MCV: 98.9 fL (ref 80.0–100.0)
Platelets: 373 10*3/uL (ref 150–400)
RBC: 2.77 MIL/uL — ABNORMAL LOW (ref 4.22–5.81)
RDW: 14.2 % (ref 11.5–15.5)
WBC: 27.7 10*3/uL — ABNORMAL HIGH (ref 4.0–10.5)
nRBC: 0 % (ref 0.0–0.2)

## 2021-07-15 LAB — MAGNESIUM: Magnesium: 2.2 mg/dL (ref 1.7–2.4)

## 2021-07-15 LAB — GLUCOSE, CAPILLARY: Glucose-Capillary: 130 mg/dL — ABNORMAL HIGH (ref 70–99)

## 2021-07-15 LAB — LACTIC ACID, PLASMA: Lactic Acid, Venous: 2 mmol/L (ref 0.5–1.9)

## 2021-07-15 LAB — BRAIN NATRIURETIC PEPTIDE: B Natriuretic Peptide: 1177.8 pg/mL — ABNORMAL HIGH (ref 0.0–100.0)

## 2021-07-15 LAB — CK: Total CK: 78 U/L (ref 49–397)

## 2021-07-15 LAB — HEPARIN LEVEL (UNFRACTIONATED)
Heparin Unfractionated: <0.1 IU/mL — ABNORMAL LOW (ref 0.30–0.70)
Heparin Unfractionated: <0.1 IU/mL — ABNORMAL LOW (ref 0.30–0.70)

## 2021-07-15 LAB — TROPONIN I (HIGH SENSITIVITY)
Troponin I (High Sensitivity): 111 ng/L (ref <18)
Troponin I (High Sensitivity): 98 ng/L — ABNORMAL HIGH (ref <18)

## 2021-07-15 LAB — APTT: aPTT: 81 seconds — ABNORMAL HIGH (ref 24–36)

## 2021-07-15 LAB — POTASSIUM: Potassium: 3.8 mmol/L (ref 3.5–5.1)

## 2021-07-15 LAB — LIPASE, BLOOD: Lipase: 19 U/L (ref 11–51)

## 2021-07-15 IMAGING — DX DG CHEST 1V PORT
1 series · 1 of 1 positions shown · non-contrast
Comparison: Earlier same day

CLINICAL DATA: Intubation

EXAM:
PORTABLE CHEST 1 VIEW

[chest]
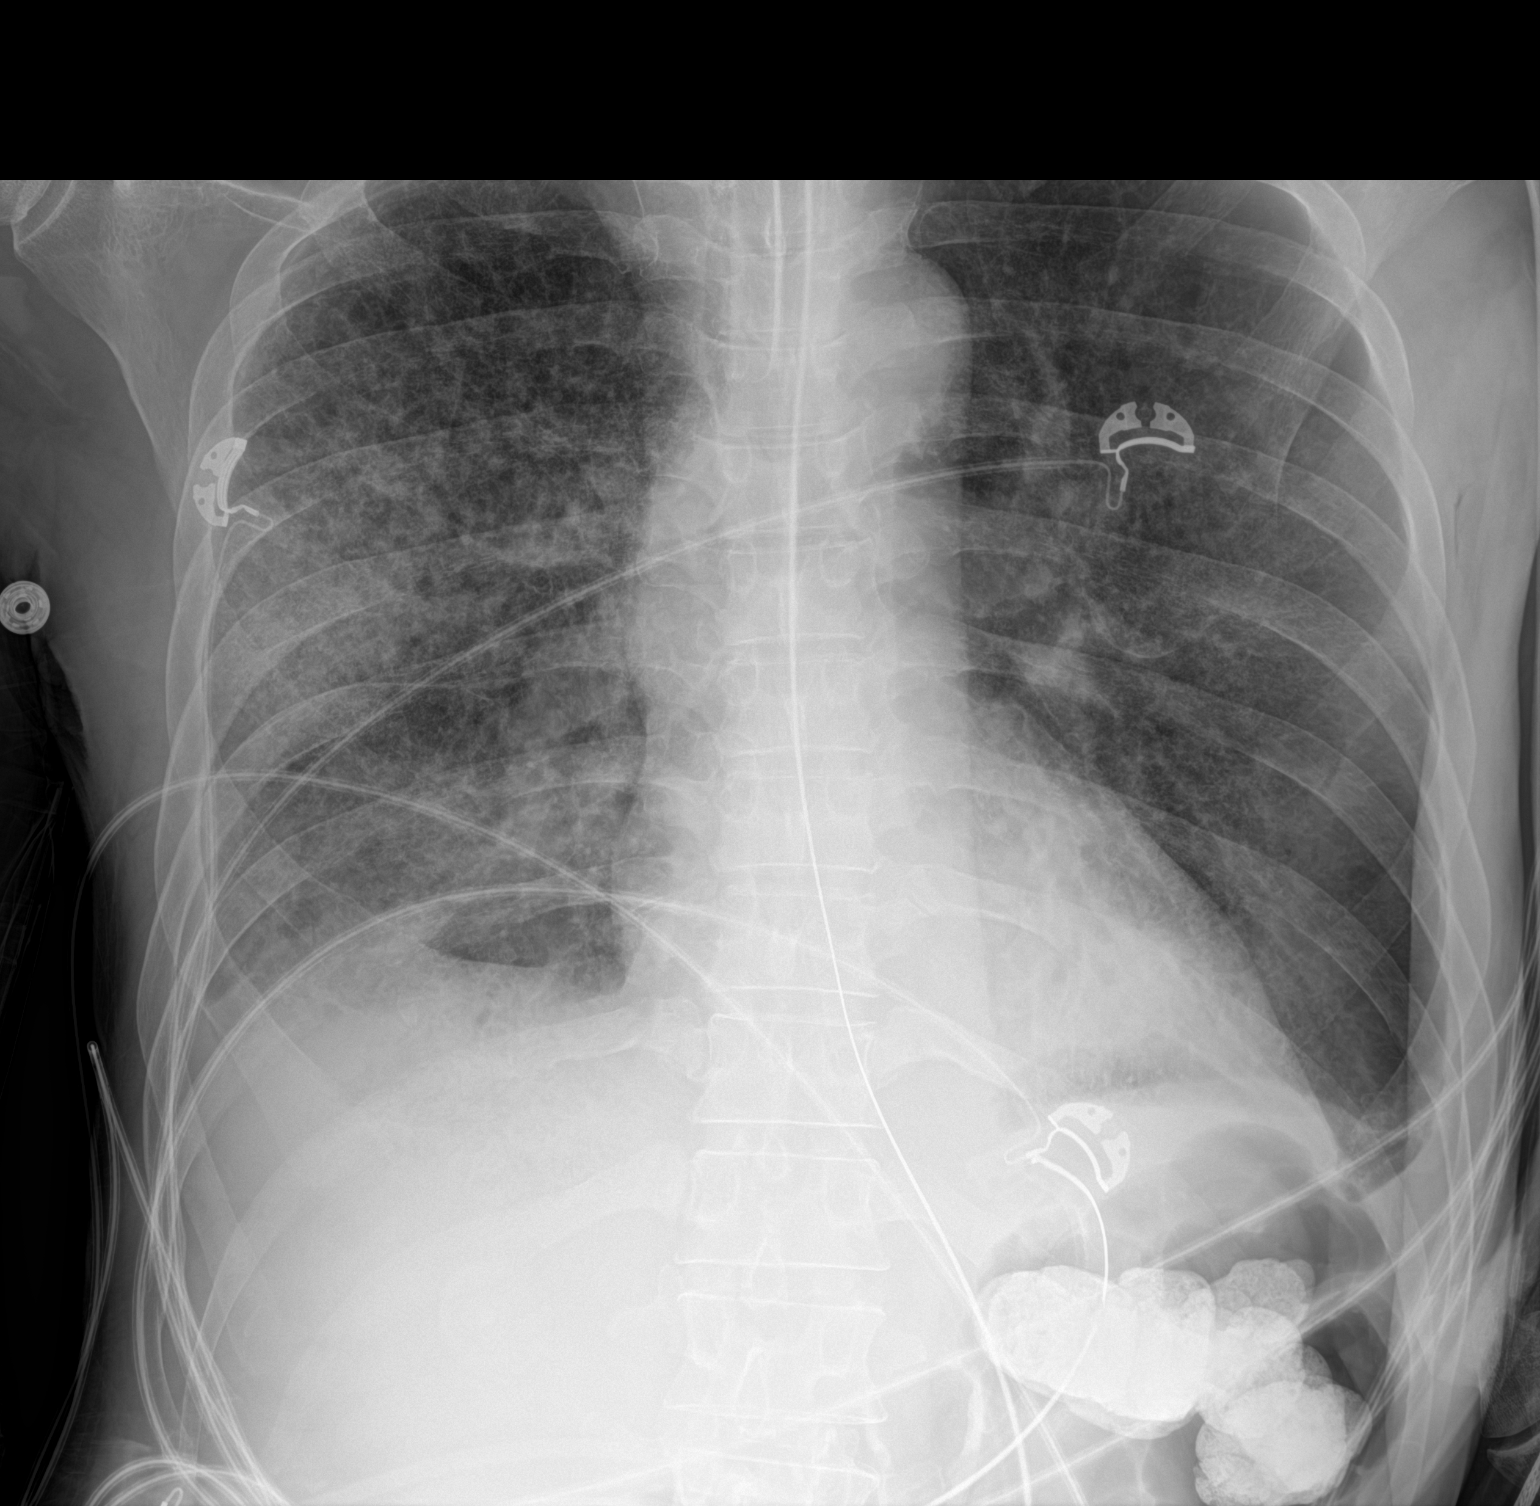

[1 of 1 positions shown; findings below may reference images not displayed]

FINDINGS: Endotracheal tube tip 3.5 cm above the carina. Orogastric or
nasogastric tube enters the stomach. Pulmonary aeration is improved,
but there are widespread pulmonary infiltrates right more than left.
IMPRESSION: Endotracheal tube tip well position 3.5 cm above the carina.
Orogastric or nasogastric tube enters the stomach.

Improved pulmonary aeration following intubation. Widespread
pulmonary infiltrates do persist, right more than left.

## 2021-07-15 IMAGING — DX DG CHEST 1V PORT
1 series · 1 of 1 positions shown · non-contrast
Comparison: [DATE]

CLINICAL DATA: Hypoxemia

EXAM:
PORTABLE CHEST 1 VIEW

[chest ap]
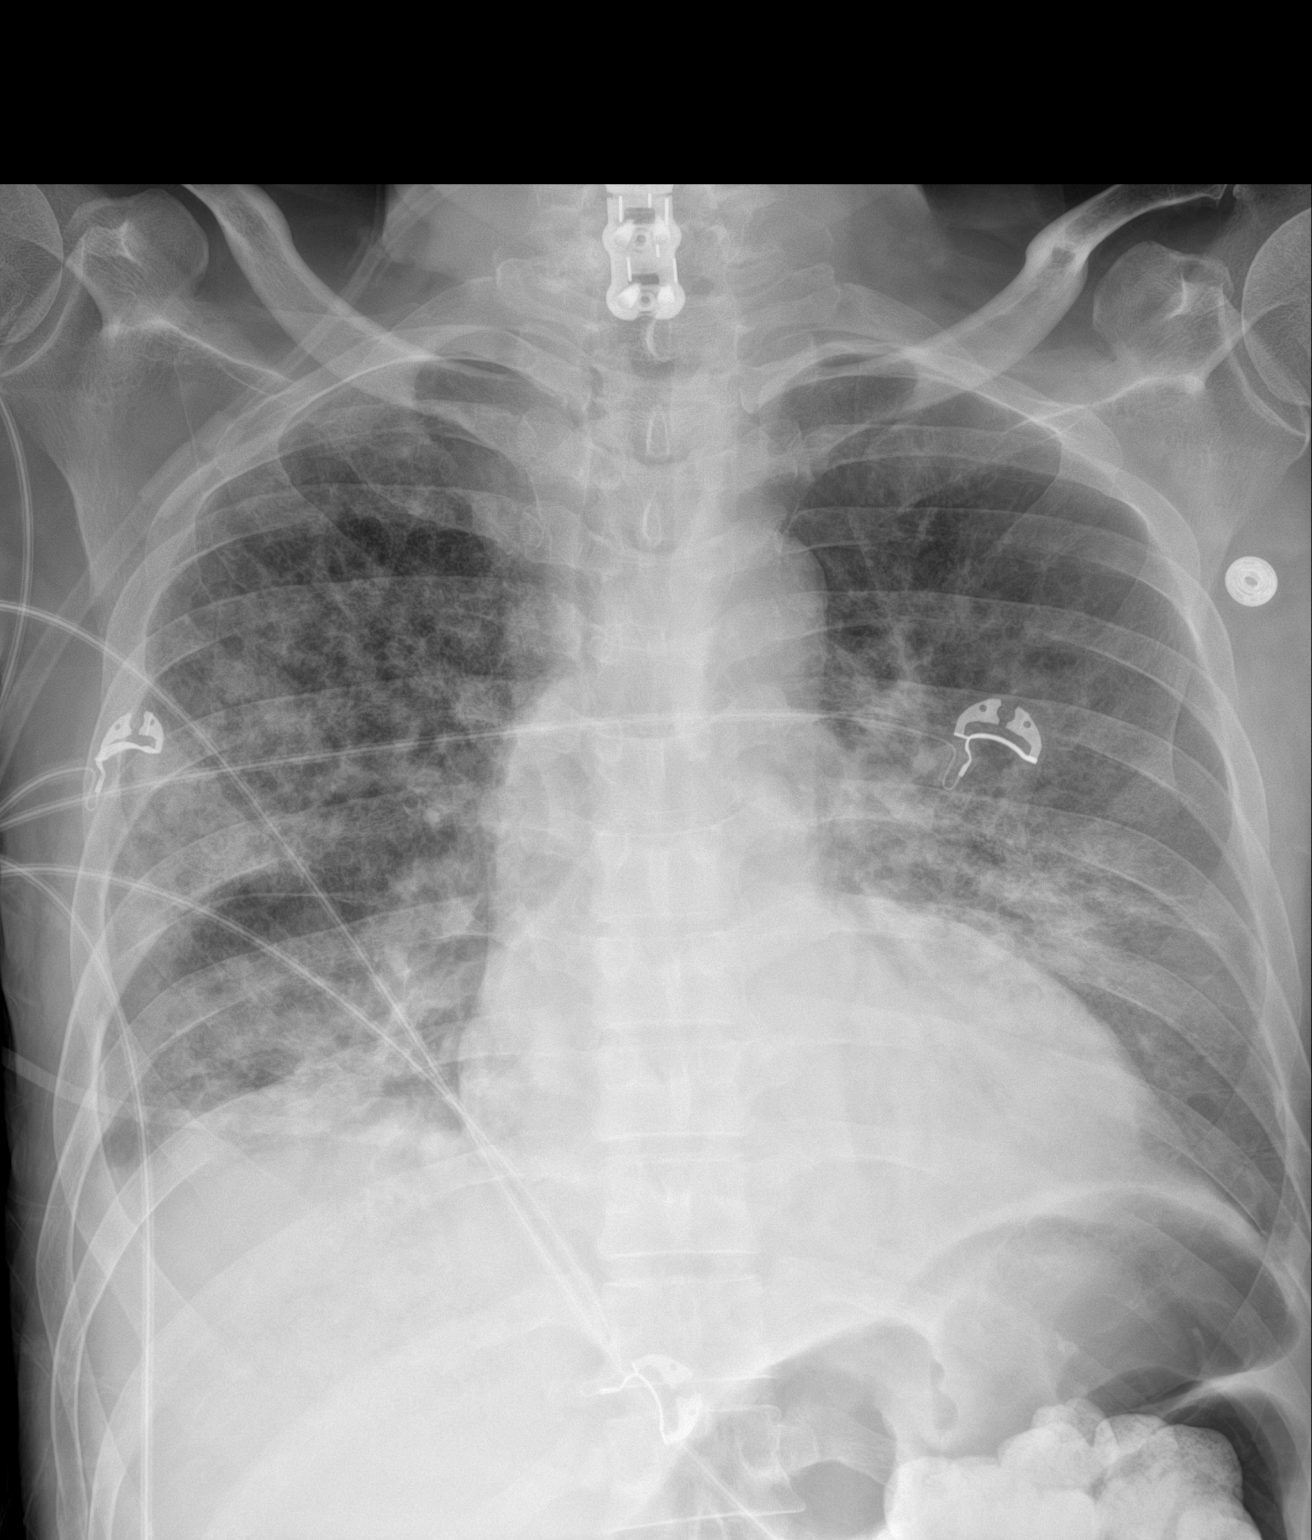

[1 of 1 positions shown; findings below may reference images not displayed]

FINDINGS: Cardiomegaly. New, diffuse bilateral heterogeneous and interstitial
airspace opacity. Underlying emphysema.
IMPRESSION: 1. New, diffuse bilateral heterogeneous and interstitial airspace
opacity, consistent with edema or infection.

2.  Emphysema.

3.  Cardiomegaly.

## 2021-07-15 IMAGING — DX DG ABD PORTABLE 1V
1 series · 1 of 1 positions shown · non-contrast
Comparison: [DATE]

CLINICAL DATA: Hypoxemia.

EXAM:
PORTABLE ABDOMEN - 1 VIEW

[abdomen supine]
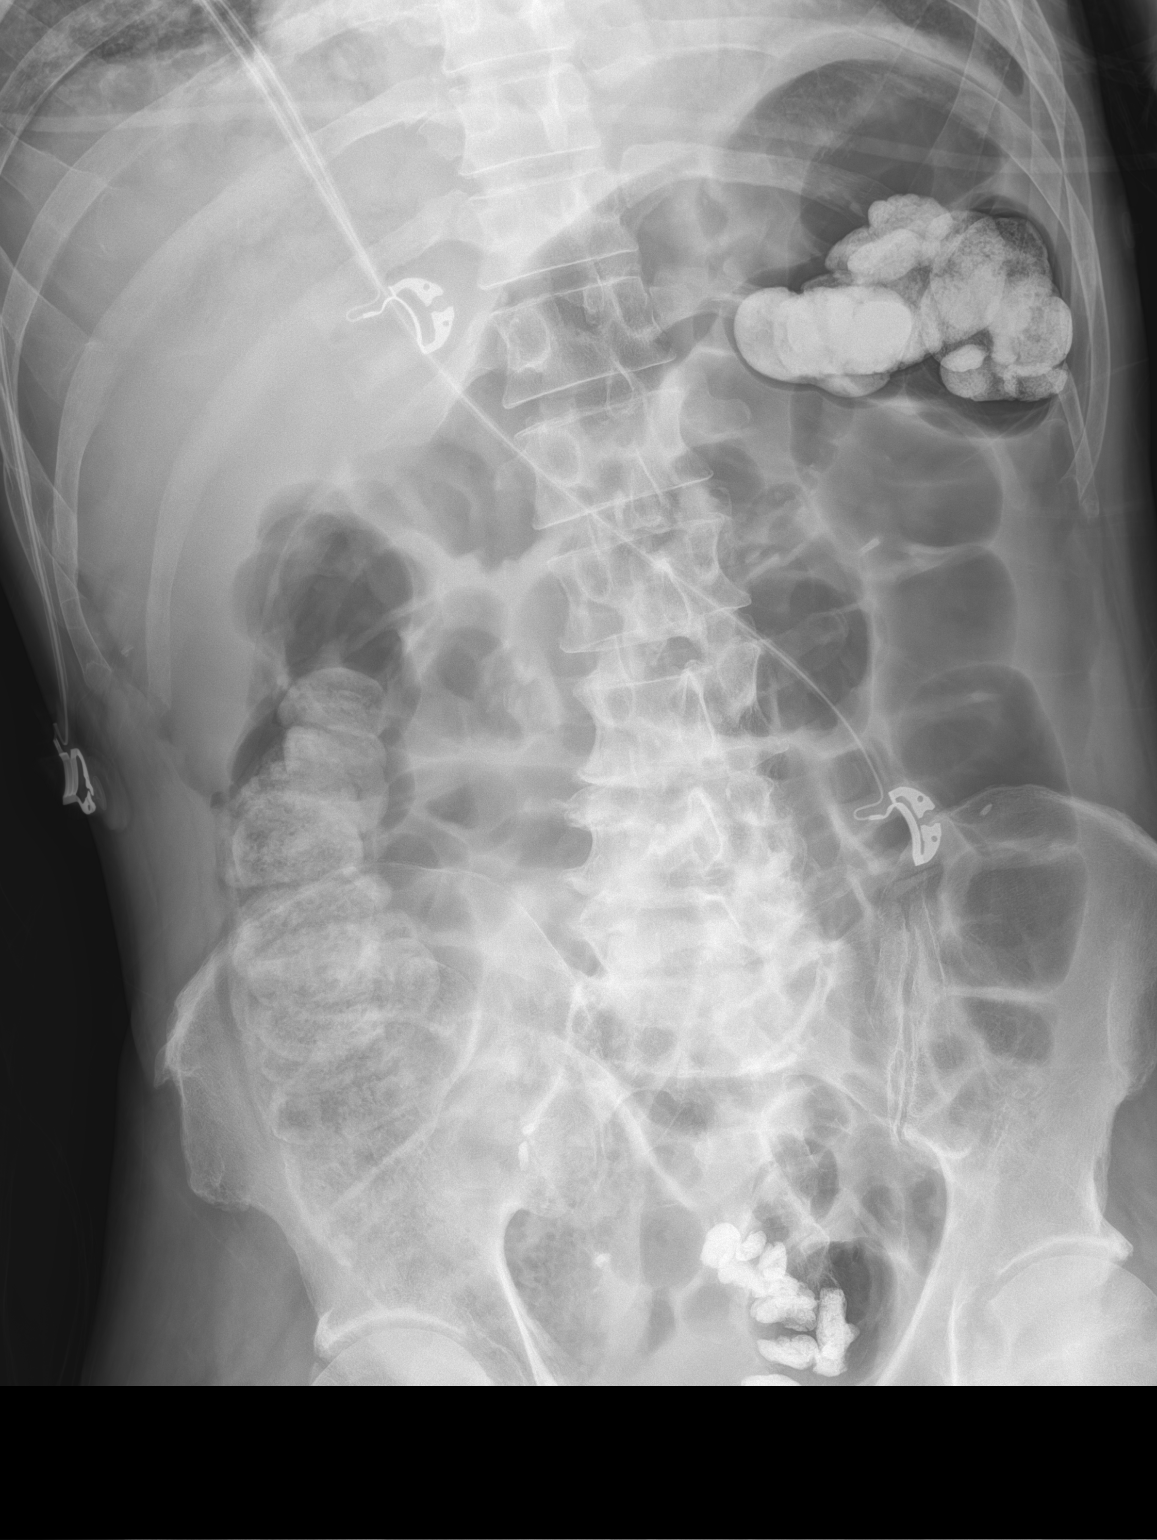

[1 of 1 positions shown; findings below may reference images not displayed]

FINDINGS: Unchanged appearance of moderate gas and fecal distension of the
colon compared with the previous exam. No significant small bowel
distension noted. Contrast material noted within the splenic flexure
and rectum. Unchanged from previous study.
IMPRESSION: No change in moderate gas and fecal distension of the colon .

## 2021-07-15 MED ORDER — SENNOSIDES-DOCUSATE SODIUM 8.6-50 MG PO TABS
1.0000 | ORAL_TABLET | Freq: Two times a day (BID) | ORAL | Status: DC
  Administered 2021-07-15: 1 via ORAL
  Filled 2021-07-15 (×2): qty 1

## 2021-07-15 MED ORDER — MIDAZOLAM HCL 2 MG/2ML IJ SOLN
1.0000 mg | INTRAMUSCULAR | Status: DC | PRN
  Administered 2021-07-15 – 2021-07-17 (×8): 1 mg via INTRAVENOUS
  Filled 2021-07-15 (×8): qty 2

## 2021-07-15 MED ORDER — FENTANYL CITRATE PF 50 MCG/ML IJ SOSY
200.0000 ug | PREFILLED_SYRINGE | Freq: Once | INTRAMUSCULAR | Status: DC
  Filled 2021-07-15: qty 4

## 2021-07-15 MED ORDER — POLYETHYLENE GLYCOL 3350 17 G PO PACK
17.0000 g | PACK | Freq: Every day | ORAL | Status: DC
  Administered 2021-07-16 – 2021-07-18 (×3): 17 g
  Filled 2021-07-15 (×3): qty 1

## 2021-07-15 MED ORDER — SENNOSIDES-DOCUSATE SODIUM 8.6-50 MG PO TABS
1.0000 | ORAL_TABLET | Freq: Two times a day (BID) | ORAL | Status: DC
  Administered 2021-07-16 – 2021-07-23 (×11): 1
  Filled 2021-07-15 (×11): qty 1

## 2021-07-15 MED ORDER — SENNOSIDES-DOCUSATE SODIUM 8.6-50 MG PO TABS: 1.0000 | ORAL_TABLET | Freq: Two times a day (BID) | ORAL | Status: DC

## 2021-07-15 MED ORDER — DOCUSATE SODIUM 50 MG/5ML PO LIQD
100.0000 mg | Freq: Two times a day (BID) | ORAL | Status: DC
  Administered 2021-07-15 – 2021-07-20 (×11): 100 mg
  Filled 2021-07-15 (×11): qty 10

## 2021-07-15 MED ORDER — FENTANYL 2500MCG IN NS 250ML (10MCG/ML) PREMIX INFUSION
INTRAVENOUS | Status: AC
  Filled 2021-07-15: qty 250

## 2021-07-15 MED ORDER — FENTANYL CITRATE PF 50 MCG/ML IJ SOSY: 50.0000 ug | PREFILLED_SYRINGE | Freq: Once | INTRAMUSCULAR | Status: AC

## 2021-07-15 MED ORDER — CHLORHEXIDINE GLUCONATE 0.12% ORAL RINSE (MEDLINE KIT)
15.0000 mL | Freq: Two times a day (BID) | OROMUCOSAL | Status: DC
  Administered 2021-07-15 – 2021-07-29 (×27): 15 mL via OROMUCOSAL

## 2021-07-15 MED ORDER — LACTATED RINGERS IV BOLUS
500.0000 mL | Freq: Once | INTRAVENOUS | Status: AC
  Administered 2021-07-15: 500 mL via INTRAVENOUS

## 2021-07-15 MED ORDER — ETOMIDATE 2 MG/ML IV SOLN
INTRAVENOUS | Status: AC
  Administered 2021-07-15: 10 mg
  Filled 2021-07-15: qty 20

## 2021-07-15 MED ORDER — ACETAMINOPHEN 160 MG/5ML PO SOLN
650.0000 mg | Freq: Four times a day (QID) | ORAL | Status: DC | PRN
  Administered 2021-07-16 – 2021-09-11 (×22): 650 mg
  Filled 2021-07-15 (×23): qty 20.3

## 2021-07-15 MED ORDER — SODIUM CHLORIDE 0.9 % IV SOLN
0.1500 mg/kg/h | INTRAVENOUS | Status: DC
  Administered 2021-07-15 – 2021-07-23 (×7): 0.15 mg/kg/h via INTRAVENOUS
  Filled 2021-07-15 (×8): qty 250

## 2021-07-15 MED ORDER — ETOMIDATE 2 MG/ML IV SOLN
20.0000 mg | Freq: Once | INTRAVENOUS | Status: AC
  Administered 2021-07-15: 10 mg via INTRAVENOUS

## 2021-07-15 MED ORDER — FENTANYL CITRATE PF 50 MCG/ML IJ SOSY: 50.0000 ug | PREFILLED_SYRINGE | INTRAMUSCULAR | Status: DC | PRN

## 2021-07-15 MED ORDER — PANTOPRAZOLE SODIUM 40 MG IV SOLR
40.0000 mg | Freq: Every day | INTRAVENOUS | Status: DC
  Administered 2021-07-15 – 2021-07-22 (×8): 40 mg via INTRAVENOUS
  Filled 2021-07-15 (×8): qty 10

## 2021-07-15 MED ORDER — DEXMEDETOMIDINE HCL IN NACL 400 MCG/100ML IV SOLN
0.4000 ug/kg/h | INTRAVENOUS | Status: DC
  Administered 2021-07-15: 1.2 ug/kg/h via INTRAVENOUS
  Filled 2021-07-15: qty 100

## 2021-07-15 MED ORDER — SUCCINYLCHOLINE CHLORIDE 200 MG/10ML IV SOSY
PREFILLED_SYRINGE | INTRAVENOUS | Status: AC
  Filled 2021-07-15: qty 10

## 2021-07-15 MED ORDER — ROCURONIUM BROMIDE 50 MG/5ML IV SOLN
60.0000 mg | Freq: Once | INTRAVENOUS | Status: AC
  Administered 2021-07-15: 60 mg via INTRAVENOUS
  Filled 2021-07-15: qty 6

## 2021-07-15 MED ORDER — MIDAZOLAM HCL 2 MG/2ML IJ SOLN
4.0000 mg | Freq: Once | INTRAMUSCULAR | Status: AC
  Administered 2021-07-15: 2 mg via INTRAVENOUS
  Filled 2021-07-15: qty 4

## 2021-07-15 MED ORDER — ORAL CARE MOUTH RINSE
15.0000 mL | OROMUCOSAL | Status: DC
  Administered 2021-07-15 – 2021-07-23 (×76): 15 mL via OROMUCOSAL

## 2021-07-15 MED ORDER — FUROSEMIDE 10 MG/ML IJ SOLN
60.0000 mg | Freq: Once | INTRAMUSCULAR | Status: AC
  Administered 2021-07-15: 60 mg via INTRAVENOUS
  Filled 2021-07-15: qty 6

## 2021-07-15 MED ORDER — FENTANYL BOLUS VIA INFUSION
100.0000 ug | Freq: Once | INTRAVENOUS | Status: AC
  Administered 2021-07-15: 100 ug via INTRAVENOUS

## 2021-07-15 MED ORDER — FENTANYL CITRATE PF 50 MCG/ML IJ SOSY
PREFILLED_SYRINGE | INTRAMUSCULAR | Status: AC
  Filled 2021-07-15: qty 2

## 2021-07-15 MED ORDER — MIDAZOLAM HCL 2 MG/2ML IJ SOLN
INTRAMUSCULAR | Status: AC
  Administered 2021-07-15: 2 mg
  Filled 2021-07-15: qty 2

## 2021-07-15 MED ORDER — FENTANYL BOLUS VIA INFUSION
50.0000 ug | INTRAVENOUS | Status: DC | PRN
  Administered 2021-07-15 (×2): 50 ug via INTRAVENOUS
  Administered 2021-07-20 – 2021-07-21 (×2): 100 ug via INTRAVENOUS
  Filled 2021-07-15: qty 100

## 2021-07-15 MED ORDER — FENTANYL 2500MCG IN NS 250ML (10MCG/ML) PREMIX INFUSION
50.0000 ug/h | INTRAVENOUS | Status: DC
  Administered 2021-07-15: 100 ug/h via INTRAVENOUS
  Administered 2021-07-16: 75 ug/h via INTRAVENOUS
  Administered 2021-07-17: 150 ug/h via INTRAVENOUS
  Administered 2021-07-18: 200 ug/h via INTRAVENOUS
  Administered 2021-07-18: 100 ug/h via INTRAVENOUS
  Administered 2021-07-19: 125 ug/h via INTRAVENOUS
  Administered 2021-07-20: 50 ug/h via INTRAVENOUS
  Administered 2021-07-21 – 2021-07-22 (×2): 100 ug/h via INTRAVENOUS
  Filled 2021-07-15 (×8): qty 250

## 2021-07-15 MED ORDER — PROPOFOL 1000 MG/100ML IV EMUL
INTRAVENOUS | Status: AC
  Administered 2021-07-15: 10 ug/kg/min via INTRAVENOUS
  Filled 2021-07-15: qty 100

## 2021-07-15 MED ORDER — PROPOFOL 1000 MG/100ML IV EMUL
0.0000 ug/kg/min | INTRAVENOUS | Status: DC
  Administered 2021-07-15 – 2021-07-16 (×2): 40 ug/kg/min via INTRAVENOUS
  Filled 2021-07-15 (×2): qty 100

## 2021-07-15 MED ORDER — NOREPINEPHRINE 4 MG/250ML-% IV SOLN
INTRAVENOUS | Status: DC
Start: 2021-07-15 — End: 2021-07-15
  Filled 2021-07-15: qty 250

## 2021-07-15 MED ORDER — ALBUTEROL SULFATE (2.5 MG/3ML) 0.083% IN NEBU
2.5000 mg | INHALATION_SOLUTION | RESPIRATORY_TRACT | Status: DC | PRN
  Administered 2021-07-17 (×2): 2.5 mg via RESPIRATORY_TRACT
  Filled 2021-07-15 (×3): qty 3

## 2021-07-15 MED ORDER — ROCURONIUM BROMIDE 10 MG/ML (PF) SYRINGE
PREFILLED_SYRINGE | INTRAVENOUS | Status: AC
  Administered 2021-07-15: 100 mg
  Filled 2021-07-15: qty 10

## 2021-07-15 MED ORDER — SODIUM CHLORIDE 0.9 % IV SOLN
2.0000 g | Freq: Three times a day (TID) | INTRAVENOUS | Status: DC
  Administered 2021-07-15 – 2021-07-17 (×7): 2 g via INTRAVENOUS
  Filled 2021-07-15 (×8): qty 2

## 2021-07-15 NOTE — Progress Notes (Signed)
Pharmacy Antibiotic Note  Brian [name] is a 61 y.o. male admitted on 06/29/2021 s/p ACDF and then exploration cervical fusion and evac epidural hematoma. Pharmacy has been consulted for vancomycin/cefepime dosing for HCAP.  He is afebrile, WBC up to 27.7, PCT up to 34.62, and Scr down 0.96. Blood and urine culture ngtd.  Plan: Continue vancomycin 1250 mg IV q24h Goal AUC 400-550, eAUC: 471.7, SCr used: 1.1  Change cefepime to 2 g IV q8h Metronidazole 500 mg IV q12h per MD Monitor renal function, clinical progress, cultures/sensitivities F/U LOT and de-escalate as able Vancomycin levels as clinically indicated   Height: 5\' 10"  (177.8 cm) Weight: 56.1 kg (123 lb 10.9 oz) IBW/kg (Calculated) : 73  Temp (24hrs), Avg:99.8 F (37.7 C), Min:97.8 F (36.6 C), Max:103.2 F (39.6 C)  Recent Labs  Lab 07/10/21 0603 07/11/21 0503 07/13/21 1747 07/14/21 0542 07/14/21 0928 07/15/21 0335 07/15/21 0816  WBC 7.4 7.0 18.2* 23.4*  --  27.7*  --   CREATININE 0.98 1.07 0.99 1.10  --  0.96  --   LATICACIDVEN  --   --   --  1.1 1.8  --  2.0*     Estimated Creatinine Clearance: 64.9 mL/min (by C-G formula based on SCr of 0.96 mg/dL).    No Known Allergies  Vancomycin 2/7>>2/8, 2/18>> Cefepime 2/7>>2/8, 2/18>> Metronidazole 2/7>>2/8, 2/18>> Unasyn 2/8>>2/11   2/7 BCx: neg 2/7 UCx: neg 2/18 BCx: ngtd 2/18 UCx: ngtd  Thank you for involving pharmacy in this patient's care.  Renold Genta, PharmD, BCPS Clinical Pharmacist Clinical phone for 07/15/2021 until 3p is [phone] 07/15/2021 1:55 PM  **Pharmacist phone directory can be found on [email or web address] listed under [place]**

## 2021-07-15 NOTE — Progress Notes (Signed)
Neurosurgery Service Progress Note  Subjective: No acute events overnight. Did have suspected aspiration of his meds while I was walking in on rounds this morning, sats down to the 80s but coming up on non-rebreather, currently back in the mid-90s and appears to be moving good volumes  Objective: Vitals:   07/15/21 0512 07/15/21 0613 07/15/21 0811 07/15/21 0927  BP: (!) 164/105 128/81 (!) 140/91   Pulse: (!) 140 (!) 120 (!) 133 (!) 109  Resp: (!) 31 (!) 26 (!) 26 (!) 25  Temp: 100.1 F (37.8 C) 99.9 F (37.7 C) (!) 100.5 F (38.1 C)   TempSrc: Oral Oral Oral   SpO2: 90% 96% 94% 93%  Weight:      Height:        Physical Exam: Anterior cervical incision c/d/I, neck soft Mild R hoffman's, none on left, reflexes 3+ at bilateral patella Strength 3/5 at the biceps, 1/5 distal BUE, able to wiggle toes bilaterally but not antigravity at the hips  Assessment & Plan: 61 y.o. man s/p ACDF / EDH / evac, complex course w/ PNA / PE.  -medicine recs -improving w/ some supplemental O2, will have to watch for further aspiration, neck is soft without concern for superficial hematoma, neurologic exam is stable, no concern for recurrent epidural hematoma  Judith Part  07/15/21 11:03 AM

## 2021-07-15 NOTE — Progress Notes (Signed)
eLink Physician-Brief Progress Note Patient Name: Brian [name] DOB: 14-Sep-1960 MRN: [id number]   Date of Service  07/15/2021  HPI/Events of Note  Notified that patient waking and thrashing in bed on vent, unable to up titrate sedation as SBP 70-80. Sedated with Fentanyl 50 with bolus' and Prop 40.  BP 77/60  HR 121 Seen adequately sedated at this time  eICU Interventions  Ordered a 500 cc LR bolus Ordered prn Versed     Intervention Category Intermediate Interventions: Hypotension - evaluation and management Minor Interventions: Agitation / anxiety - evaluation and management  Darl Pikes 07/15/2021, 11:13 PM

## 2021-07-15 NOTE — Progress Notes (Signed)
1102- RN seen on the monitor in hallway pulse ox was off pt. After reattaching, O2 sats reading high 70s. Pt alert, confused, not complaining of SOB. Muddy turned up to 6L with sats reading mid 80s. Dr.Ostergard at bedside. Non-rebreather placed on pt with O2 sats recovering to 95%.   1131- RN attempted to place pt back on [facility]. Pts O2 sats dropped to 87%, respirations 25-30. Pt placed back on non-rebreather with improvements of 95-9697%.[facility], rapid response, and respiratory called. See MAR for new orders.

## 2021-07-15 NOTE — Progress Notes (Signed)
VASCULAR LAB    Bilateral lower extremity venous duplex has been performed.  See CV proc for preliminary results.   Jacqualin Shirkey, RVT 07/15/2021, 9:10 AM

## 2021-07-15 NOTE — Progress Notes (Signed)
°   07/15/21 0512  Assess: MEWS Score  Temp 100.1 F (37.8 C)  BP (!) 164/105  Pulse Rate (!) 140  ECG Heart Rate (!) 142  Resp (!) 31  Level of Consciousness Alert  SpO2 90 %  O2 Device Nasal Cannula  O2 Flow Rate (L/min) 4 L/min  Assess: MEWS Score  MEWS Temp 0  MEWS Systolic 0  MEWS Pulse 3  MEWS RR 2  MEWS LOC 0  MEWS Score 5  MEWS Score Color Red  Assess: if the MEWS score is Yellow or Red  Were vital signs taken at a resting state? Yes  Focused Assessment No change from prior assessment  Early Detection of Sepsis Score *See Row Information* High  MEWS guidelines implemented *See Row Information* Yes  Treat  MEWS Interventions Administered scheduled meds/treatments  Take Vital Signs  Increase Vital Sign Frequency  Red: Q 1hr X 4 then Q 4hr X 4, if remains red, continue Q 4hrs  Escalate  MEWS: Escalate Red: discuss with charge nurse/RN and provider, consider discussing with RRT  Notify: Charge Nurse/RN  Name of Charge Nurse/RN Notified Glady, RN  Document  Patient Outcome Stabilized after interventions  Progress note created (see row info) Yes

## 2021-07-15 NOTE — Assessment & Plan Note (Signed)
Scheduled bowel regimen.  Since he is n.p.o. now, we will try suppository once respiratory status stabilized

## 2021-07-15 NOTE — Progress Notes (Signed)
NAME:  Brian [name], MRN:  [id number], DOB:  20-May-1961, LOS: 16 ADMISSION DATE:  06/29/2021, CONSULTATION DATE:  07/03/21 REFERRING MD:  Doran Durand, NP, CHIEF COMPLAINT:  AMS   History of Present Illness:   61 year old male with prior history of tobacco abuse and ETOH use who recently was found to have critical multilevel cervical spinal stenosis C3-6 with spinal cord compression and spinal cord signal change after recent fall with progressive upper and lower extremity weakness and tremors.  He was admitted on 2/3 and underwent ACDF of C3-4, C4-5, C5-5, and C6-7.  Post-operatively he was progressing slowly with residual weakness in both hands but improving lower extremity strength and function.  He developed some difficulty swallowing on 2/5 and SLP ordered and placed on thickened liquids.   Overnight 2/6, patient with increased difficulty swallowing and was made NPO but also noted to have dysarthria and difficulty moving extremities with numbness.  SLP evaluated and found to be moderate aspiration risk.   Also progressively tachycardic, tachypneic, and now febrile.  Code stroke activated and taken for CT/ CTA head and neck and was given decadron 10mg  once.  NIHSS 18.  CTH was negative for acute findings, and CTA head neck did not reveal any LVO but noted significant prevertebral soft tissue swelling with foci of gas present at the ventral epidural space at C4-5, small collection not excluded.  On 2/7, patient with worsening confusion and concern for airway involvement, PCCM consulted for further evaluation.   Pertinent  Medical History  Tobacco abuse, cervical stenosis, ETOH use   Significant Hospital Events: Including procedures, antibiotic start and stop dates in addition to other pertinent events   2/3 ACDF C3-4, C4-5, C5-5, and C6-7 w/ Dr. 4/7 2/6 SLP eval for difficulty swallowing 2/7 Code stroke overnight, neg for LVO, CT showing soft tissue swelling.  PCCM consulted for concern of airway  management and AMS.  Went for evacuation of hematoma   Images  2/7 CT angio head and neck > No large vessel occlusion, hemodynamically significant stenosis, or evidence of dissection. Significant prevertebral soft tissue swelling. Foci of gas are present the ventral epidural space at C4-C5; a small collection is not excluded.  Interim History / Subjective:  More confused.  CT abd/ pelvis yesterday showing bilateral segmental Pes, started on heparin and showing worsening pneumonia, febrile 103.  PCT rising, 34.6, restarted on abx 2/18.  Desaturation today after taking orals today, on NRB, HHFNC attempted.  Has been on dysphagia 2 diet  Switching from heparin to angiomax given subtherapeutic levels despite increase   Objective   Blood pressure 138/87, pulse (!) 140, temperature 98.3 F (36.8 C), temperature source Axillary, resp. rate (!) 39, height 5\' 10"  (1.778 m), weight 56.1 kg, SpO2 96 %.        Intake/Output Summary (Last 24 hours) at 07/15/2021 1425 Last data filed at 07/15/2021 1321 Gross per 24 hour  Intake 1518 ml  Output 1100 ml  Net 418 ml   Filed Weights   07/11/21 0500 07/12/21 0500 07/13/21 0337  Weight: 56.3 kg 56.3 kg 56.1 kg   Examination: General:  thin and ill appearing adult male sitting upright in bed in mod distress, agitated HEENT:  pupils 3/reactive, soft cervical collar in place Neuro:  awake, confused, has been hallucinating, MAE- no appreciated weakness CV:  ST, no murmur PULM:  increased work of breathing, coarse, bibasilar rales R> L on NRB 88-91% GI: soft, NT, hypobs Extremities: warm/dry, no LE edema  Skin: no rashes   Resolved Hospital Problem list    Assessment & Plan:   Acute hypoxic respiratory failure secondary to recurrent aspiration pneumonia Dysphagia  Bilateral segmental PE (new 2/18) P:  - transfer to ICU - remains on NRB, continued desat despite HHFNC at 35L/ 1.0 with increasing WOB and ongoing confusion - plans for intubation -  sent trach asp, urine strep - full MV support, PRVC 8cc/kg IBW with goal Pplat <30 and DP<15  -VAP prevention protocol/ PPI -PAD protocol for sedation> fentanyl gtt/ precedex, RASS goal -1, scheduled bowel regimen -wean FiO2 as able for SpO2 >92%  -daily SAT & SBT when appropiate - continue cefepime and vanc.  D/c flagyl.  Repeat MRSA PCR, if neg can stop vanc - changing heparin to angiomax given sub-therapeutic levels despite increases (also, heparin started no bolus on 2/18 given recent hematoma evac on 2/7).  - pending echo.  BNP 1178, trop hs 137 yest suggestive of RV involvement.  Will repeat today.  BLE dupplex neg for DVT 2/19.  - will need cortrak for recurrent aspiration and ongoing SLP when able   Sepsis secondary to aspiration pneumonia  P: - remains hemodynamically stable.  No further diuresis.  Overall negative net balance -3.3L.  Will give LR bolus now. Goal MAP > 65 - assess lactic   Severe cervical spinal stenosis with spinal cord compression s/p ACDF C3-C7 on 2/3 c/b epidural hematoma s/p evacuation 2/7 Exam drastically improved s/p evacuation.  P: - per NSGY, continue soft cervical collar  Daily tobacco use  Occasional ETOH use P: - empiric thiamine/ folate/ MVI when enteral established - Tobacco cessation education when appropriate   Hyponatremia - looks more on the hypovolemic side.  Getting IVF now. Was given lasix earlier today.   Constipation vs fecal impaction - no other acute findings on CT other than large stool burden in colon - cont fleets, dulcolax and miralax  Normocytic anemia - monitor CBC, transfuse for < 7  Protein calorie malnutrition - TF per RD recs  Best Practice (right click and "Reselect all SmartList Selections" daily)   Diet/type: NPO DVT prophylaxis: other angiomax GI prophylaxis: PPI Lines: N/A Foley:  Yes, and it is still needed Code Status:  full code Last date of multidisciplinary goals of care discussion:   Updated son,  [name] by phone who confirmed full code status.  Consider PMT consult for longterm goals of care.   Critical care time: 60 mins   Posey Boyer, ACNP [facility] 07/15/2021, 2:25 PM  See Amion for pager If no response to pager, please call PCCM consult pager After 7:00 pm call Elink

## 2021-07-15 NOTE — Progress Notes (Addendum)
ANTICOAGULATION CONSULT NOTE  Pharmacy Consult for heparin Indication: pulmonary embolus  No Known Allergies  Patient Measurements: Height: 5\' 10"  (177.8 cm) Weight: 56.1 kg (123 lb 10.9 oz) IBW/kg (Calculated) : 73 Heparin Dosing Weight: TBW  Vital Signs: Temp: 98.3 F (36.8 C) (02/19 1100) Temp Source: Axillary (02/19 1100) BP: 138/87 (02/19 1100) Pulse Rate: 109 (02/19 1139)  Labs: Recent Labs    07/13/21 1110 07/13/21 1314 07/13/21 1747 07/13/21 1747 07/14/21 0542 07/14/21 1929 07/15/21 0335 07/15/21 1237  HGB  --   --  10.3*   < > 10.9*  --  9.2*  --   HCT  --   --  31.5*  --  31.2*  --  27.4*  --   PLT  --   --  396  --  410*  --  373  --   HEPARINUNFRC  --   --   --   --   --  <0.10* <0.10* <0.10*  CREATININE  --   --  0.99  --  1.10  --  0.96  --   CKTOTAL  --   --   --   --  43*  --  78  --   TROPONINIHS 12 22* 137*  --   --   --   --   --    < > = values in this interval not displayed.     Estimated Creatinine Clearance: 64.9 mL/min (by C-G formula based on SCr of 0.96 mg/dL).   Medical History: History reviewed. No pertinent past medical history.   Assessment: 84 yom s/p ACDF then exploration and evacuation of epidural hematoma. He is on antibiotics for PNA. Pharmacy consulted to begin IV heparin for bilateral PE on CTA. No boluses per discussion with Dr Cyndia Skeeters.   Heparin level is undetectable at <0.1 on 1300 units/hr - this is 3rd undetectable level. Spoke with RN and no problems with infusion or line. No bleeding noted, CBC stable.  Goal of Therapy:  Heparin level 0.3-0.7 units/ml Monitor platelets by anticoagulation protocol: Yes   Plan:  -Increase heparin drip to 1500 units/hr -Heparin level in 8 hours and daily wth CBC daily -Monitor for s/sx of bleeding  Thank you for involving pharmacy in this patient's care.  Renold Genta, PharmD, BCPS Clinical Pharmacist Clinical phone for 07/15/2021 until 3p is [phone] 07/15/2021 1:45  PM  **Pharmacist phone directory can be found on [email or web address] listed under [place]**  Addendum:  Discussed changing agent with Dr Cyndia Skeeters - ok to change to bivaliruden. Also spoke with Dr Verlee Monte from [facility] who is also ok with change.  D/c heparin drip Begin bivalirudin at 0.15 mcg/kg/min aPTT in 4 hr (goal 50-85 sec)  Renold Genta, PharmD, BCPS 2:38 PM

## 2021-07-15 NOTE — Progress Notes (Signed)
ANTICOAGULATION CONSULT NOTE  Pharmacy Consult for heparin Indication: pulmonary embolus  No Known Allergies  Patient Measurements: Height: 5\' 10"  (177.8 cm) Weight: 56.1 kg (123 lb 10.9 oz) IBW/kg (Calculated) : 73 Heparin Dosing Weight: TBW  Vital Signs: Temp: 97.8 F (36.6 C) (02/19 0328) Temp Source: Oral (02/19 0328) BP: 105/73 (02/19 0328) Pulse Rate: 100 (02/19 0328)  Labs: Recent Labs    07/13/21 1110 07/13/21 1314 07/13/21 1747 07/13/21 1747 07/14/21 0542 07/14/21 1929 07/15/21 0335  HGB  --   --  10.3*   < > 10.9*  --  9.2*  HCT  --   --  31.5*  --  31.2*  --  27.4*  PLT  --   --  396  --  410*  --  373  HEPARINUNFRC  --   --   --   --   --  <0.10* <0.10*  CREATININE  --   --  0.99  --  1.10  --   --   CKTOTAL  --   --   --   --  43*  --   --   TROPONINIHS 12 22* 137*  --   --   --   --    < > = values in this interval not displayed.     Estimated Creatinine Clearance: 56.7 mL/min (by C-G formula based on SCr of 1.1 mg/dL).   Medical History: History reviewed. No pertinent past medical history.  Medications:  Medications Prior to Admission  Medication Sig Dispense Refill Last Dose   naproxen sodium (ALEVE) 220 MG tablet Take 440 mg by mouth 2 (two) times daily as needed (pain.).   Past Week   methylPREDNISolone (MEDROL DOSEPAK) 4 MG TBPK tablet Take as prescribed (Patient not taking: Reported on 06/27/2021) 1 each 0 Not Taking    Assessment: 23 yom s/p ACDF then exploration and evacuation of epidural hematoma. He is on antibiotics for PNA. Pharmacy consulted to begin IV heparin for bilateral PE on CTA. No boluses per discussion with Dr Cyndia Skeeters.   Goal of Therapy:  Heparin level 0.3-0.7 units/ml Monitor platelets by anticoagulation protocol: Yes   Plan:  -Increase heparin to 1300 units/hr -Heparin level in 8 hours and daily wth CBC daily  Thank you Anette Guarneri, PharmD **Pharmacist phone directory can now be found on [email or web address] ([email or web address]).  Listed  under [place].

## 2021-07-15 NOTE — Procedures (Signed)
Intubation Procedure Note  ARNEZ KOSTERMAN  FO:[id number]  Sep 28, 1960  Date:07/15/21  Time:3:02 PM   Provider Performing:Brisa Auth M Verlee Monte    Procedure: Intubation (31500)  Indication(s) Respiratory Failure  Consent Risks of the procedure as well as the alternatives and risks of each were explained to the patient and/or caregiver.  Consent for the procedure was obtained and is signed in the bedside chart   Anesthesia Etomidate, Versed, and Rocuronium   Time Out Verified patient identification, verified procedure, site/side was marked, verified correct patient position, special equipment/implants available, medications/allergies/relevant history reviewed, required imaging and test results available.   Sterile Technique Usual hand hygeine, masks, and gloves were used   Procedure Description Patient positioned in bed supine.  Sedation given as noted above.  Patient was intubated with endotracheal tube using Glidescope s4.  View was Grade 1 full glottis .  Number of attempts was 1.  Colorimetric CO2 detector was consistent with tracheal placement.   Complications/Tolerance None; patient tolerated the procedure well. Chest X-ray is ordered to verify placement.   EBL Minimal   Specimen(s) None

## 2021-07-15 NOTE — Assessment & Plan Note (Addendum)
Likely Multifactorial with significant dysphagia and recurrent aspiration pneumonia events during initial hospitalization following ACDF surgery with postoperative complications of postoperative cervical hematoma.  Patient did require ventilatory support in the intensive care unit and was successfully extubated on 07/22/2021.  Patient completed extensive course of empiric antibiotics.  Oxygen now weaned off, Now on room air. --Continue Aspiration precautions

## 2021-07-15 NOTE — Progress Notes (Signed)
PROGRESS NOTE  HAREL EMORY [id number] DOB: Aug 23, 1960   PCP: Pcp, No  Patient is from: Home  DOA: 06/29/2021 LOS: 16  Chief complaints:  No chief complaint on file.    Brief Narrative / Interim history: 61 year old M with PMH of tobacco and EtOH use disorder who was admitted by neurosurgery for cervical myelopathy and underwent ACDF on 2/3.  Postop course complicated by strokelike symptoms, encephalopathy and prevertebral soft tissue swelling with foci of gas at the ventral epidural space.  He underwent exploration of cervical fusion with evacuation of the epidural hematoma on 2/7.  He was also started on CIWA on 2/11.   Hospital course complicated by delirium/encephalopathy, aspiration pneumonia.  Work-up for acute encephalopathy and CVA unrevealing.  Neurology signed off.  He is currently on dysphagia 2 diet.   Septic with acute respiratory failure due to recurrent aspiration pneumonia.  Also positive for bilateral segmental PE.  Started on broad-spectrum antibiotics, IV fluid and IV heparin.    Worsening hypoxia the morning of 2/19 requiring NRB.  Portable CXR with new, diffuse bilateral heterogeneous and interstitial airspace opacity, consistent with edema or infection.  Stopped IV fluid.  Started IV Lasix.  RT to try heated HFNC.  Not a good candidate for BiPAP as he has intermittent emesis and not able to take off mask    Subjective: Patient was stable on 4 L nasal cannula earlier this morning.  HR in 100s.  He developed acute hypoxemia requiring nonrebreather likely from aspiration.  Rapid response called..  Chest x-ray concerning for new infiltrate/edema.  BNP and IV Lasix ordered.   Objective: Vitals:   07/15/21 0811 07/15/21 0927 07/15/21 1100 07/15/21 1139  BP: (!) 140/91  138/87   Pulse: (!) 133 (!) 109 (!) 113 (!) 109  Resp: (!) 26 (!) 25 (!) 24 (!) 24  Temp: (!) 100.5 F (38.1 C)  98.3 F (36.8 C)   TempSrc: Oral  Axillary   SpO2: 94% 93%  95%  Weight:       Height:        Examination:  GENERAL: No apparent distress.  Very frail. HEENT: MMM.  Vision and hearing grossly intact.  NECK: Supple.  Not able to assess JVD due to neck collar. RESP: 95% on NRB.  No IWOB.  Fair aeration but bibasilar crackles. CVS: HR in 120s.  Regular rhythm.  Heart sounds normal.  ABD/GI/GU: BS+. Abd soft, NTND.  MSK/EXT:  Moves extremities.  Frail and chronically ill-appearing. SKIN: no apparent skin lesion or wound NEURO: Awake and alert.  Oriented to self.  Follows some commands.  Confused for most part.  Weakness in all extremities (unchanged). PSYCH: Calm but confused.  Procedures:  2/3-ACDF 2/7-cervical epidural hematoma evacuation   Assessment and Plan: * Cervical myelopathy ([place])- (present on admission) S/p ACDF on 2/3 complicated by epidural hematoma s/p evacuation on 2/7 -Per primary.  Acute respiratory failure with hypoxia (HCC) Desaturated to 70s on 4 L by [place] and only improved to 80s on 6 L.  Started on NRB and recovered to 95%.  Some concern about aspiration event.  Per RN, he had an episode of emesis and aspiration of his pills last night.  There is also some concern about aspiration of his pills this morning.  He is already on broad-spectrum antibiotics for aspiration pneumonia.  He is also on IV heparin for segmental PE.  He was on IV fluid for severe sepsis. CXR raises concern for new heterogeneous opacity.  Patient is  very confused and not able to provide reliable story. -We will try high flow nasal cannula -Not a candidate for BiPAP as he is not the mask off in case of emesis -Follow echocardiogram and BNP -Strict intake and output -N.p.o. pending reevaluation by SLP  -PCCM consulted.    Severe sepsis due to recurrent aspiration pneumonia This seems to be recurrent issue.  Recently completed antibiotic course.  Now with hypoxic respiratory failure, fever, tachycardia, tachypnea and significant leukocytosis.  CTA chest/abdomen/pelvis  with bilateral segmental PE and BLL airspace disease with air bronchograms.  Procalcitonin markedly elevated.  Marginal lactic acid.  Blood cultures NGTD.  Urine culture pending. -Continue broad-spectrum antibiotics with Vanco, cefepime and Flagyl -Follow urine cultures.  Check respiratory culture -Requested SLP reevaluation -Aspiration precaution  Acute pulmonary embolism (HCC) Bilateral segmental PE noted on CTA chest.  LE [place] negative for DVT.  Patient dose of pharmacologic anticoagulation due to postsurgical hematoma.  -Continue IV heparin. -Follow echocardiogram  Sinus tachycardia Multifactorial including sepsis, dehydration, PE, agitation .Marland KitchenMarland KitchenImproved.  HR in 100s to 120s today.   -Treat treatable causes -Increased p.o. metoprolol to 50 mg twice daily -IV metoprolol as needed with parameters -Follow echocardiogram  Acute metabolic encephalopathy CT head, MRI brain, EEG, TSH, B12, ammonia unrevealing.  Remains confused with fluctuating mental status.  Agitated, angry and frustrated at times.  Looks [place] today but still confused. -Treat sepsis as below -Changed Seroquel to olanzapine -Reorientation and delirium precautions  Protein-calorie malnutrition, severe ([place]) As evidenced by poor p.o. intake, significant muscle mass and subcu fat loss and significant weight loss (about 16 pounds since this hospitalization). Nutrition Problem: Severe Malnutrition (in the context of social/environmental circumstances) Etiology:  (inadequate energy intake) Signs/Symptoms: mild fat depletion, severe muscle depletion, severe muscle depletion Interventions: Refer to RD note for recommendations  May need assistance with feeding.  Dysphagia -N.p.o. pending reevaluation by SLP -Aspiration precautions  Elevated troponin Suspect demand ischemia versus ACS.  EKG reassuring. -Check echocardiogram  Bandemia Likely due to sepsis. -Management as above -Continue monitoring  Chest pain at  rest Multifactorial including musculoskeletal chest pain (somewhat reproducible), segmental PE and pneumonia.  EKG without acute ischemic finding.  Troponin trended from 11>> 137 likely demand ischemia from tachycardia and sepsis.  -Treat tachycardia and sepsis as above -Follow echocardiogram -Continue p.o. Protonix and GI cocktail as needed -Pain medication with bowel regimen  Hyponatremia Likely from dehydration versus SIADH.  Urine sodium low suggesting dehydration.  Improved with IV NS but developed respiratory distress. -Continue monitoring  Physical deconditioning Significant weakness in all extremities as a result of cervical myelopathy and acute illness. -Continue PT/OT  Constipation Scheduled bowel regimen.  Since he is n.p.o. now, we will try suppository once respiratory status stabilized   Elevated liver enzymes Resolving.  Tobacco use disorder Encouraged cessation. Nicotine patch as needed  Hyperglycemia-resolved as of 07/12/2021 Likely due to steroid.  Resolved.  A1c 5.2%.   DVT prophylaxis:  SCD's Start: 06/29/21 1231  Code Status: Full code Family Communication: Updated patient's son over the phone on 2/18 Level of care: Progressive. May need to move to ICU if respiratory status does not improve Status is: Inpatient  Disposition: Per primary.     Sch Meds:  Scheduled Meds:  Chlorhexidine Gluconate Cloth  6 each Topical Daily   feeding supplement  237 mL Oral BID BM   mouth rinse  15 mL Mouth Rinse q12n4p   melatonin  3 mg Oral QHS   metoprolol tartrate  50 mg Oral  BID   multivitamin with minerals  1 tablet Oral Daily   OLANZapine  5 mg Oral BID   pantoprazole  40 mg Oral Daily   senna-docusate  1 tablet Oral BID   sodium chloride flush  3 mL Intravenous Q12H   tamsulosin  0.4 mg Oral Daily   Continuous Infusions:  sodium chloride 250 mL (06/29/21 1316)   sodium chloride 125 mL/hr at 07/15/21 0158   ceFEPime (MAXIPIME) IV 2 g (07/15/21 0819)    heparin 1,300 Units/hr (07/15/21 1135)   metronidazole 500 mg (07/15/21 0930)   vancomycin 1,250 mg (07/15/21 1049)   PRN Meds:.acetaminophen **OR** acetaminophen, alum & mag hydroxide-simeth, bisacodyl, HYDROcodone-acetaminophen, HYDROcodone-acetaminophen, HYDROmorphone (DILAUDID) injection, menthol-cetylpyridinium **OR** phenol, metoprolol tartrate, ondansetron **OR** ondansetron (ZOFRAN) IV, sodium chloride flush, sodium phosphate, white petrolatum  Antimicrobials: Anti-infectives (From admission, onward)    Start     Dose/Rate Route Frequency Ordered Stop   07/15/21 0800  ceFEPIme (MAXIPIME) 2 g in sodium chloride 0.9 % 100 mL IVPB        2 g 200 mL/hr over 30 Minutes Intravenous Every 8 hours 07/15/21 0723 07/21/21 0559   07/14/21 2200  ceFEPIme (MAXIPIME) 2 g in sodium chloride 0.9 % 100 mL IVPB  Status:  Discontinued        2 g 200 mL/hr over 30 Minutes Intravenous Every 12 hours 07/14/21 0856 07/15/21 0723   07/14/21 1000  metroNIDAZOLE (FLAGYL) IVPB 500 mg        500 mg 100 mL/hr over 60 Minutes Intravenous Every 12 hours 07/14/21 0806     07/14/21 1000  vancomycin (VANCOREADY) IVPB 1250 mg/250 mL        1,250 mg 166.7 mL/hr over 90 Minutes Intravenous Every 24 hours 07/14/21 0852 07/21/21 0959   07/14/21 0900  vancomycin (VANCOCIN) IVPB 1000 mg/200 mL premix  Status:  Discontinued        1,000 mg 200 mL/hr over 60 Minutes Intravenous  Once 07/14/21 0806 07/14/21 0852   07/14/21 0830  ceFEPIme (MAXIPIME) 2 g in sodium chloride 0.9 % 100 mL IVPB        2 g 200 mL/hr over 30 Minutes Intravenous  Once 07/14/21 0806 07/14/21 1217   07/04/21 1500  Ampicillin-Sulbactam (UNASYN) 3 g in sodium chloride 0.9 % 100 mL IVPB  Status:  Discontinued        3 g 200 mL/hr over 30 Minutes Intravenous Every 8 hours 07/04/21 0806 07/07/21 1009   07/04/21 0100  vancomycin (VANCOCIN) IVPB 1000 mg/200 mL premix  Status:  Discontinued        1,000 mg 200 mL/hr over 60 Minutes Intravenous Every 12  hours 07/03/21 1105 07/04/21 0750   07/03/21 1522  vancomycin (VANCOCIN) powder  Status:  Discontinued          As needed 07/03/21 1524 07/03/21 1549   07/03/21 1200  metroNIDAZOLE (FLAGYL) IVPB 500 mg  Status:  Discontinued        500 mg 100 mL/hr over 60 Minutes Intravenous 2 times daily 07/03/21 1100 07/03/21 1107   07/03/21 1200  ceFEPIme (MAXIPIME) 2 g in sodium chloride 0.9 % 100 mL IVPB  Status:  Discontinued        2 g 200 mL/hr over 30 Minutes Intravenous Every 8 hours 07/03/21 1102 07/04/21 0750   07/03/21 1200  metroNIDAZOLE (FLAGYL) IVPB 500 mg  Status:  Discontinued        500 mg 100 mL/hr over 60 Minutes Intravenous Every 8 hours 07/03/21 1107  07/04/21 0750   07/03/21 1145  vancomycin (VANCOREADY) IVPB 1500 mg/300 mL        1,500 mg 150 mL/hr over 120 Minutes Intravenous  Once 07/03/21 1100 07/03/21 1607   07/03/21 1045  Ampicillin-Sulbactam (UNASYN) 3 g in sodium chloride 0.9 % 100 mL IVPB  Status:  Discontinued        3 g 200 mL/hr over 30 Minutes Intravenous Every 6 hours 07/03/21 0957 07/03/21 1100   06/29/21 1215  ceFAZolin (ANCEF) IVPB 1 g/50 mL premix        1 g 100 mL/hr over 30 Minutes Intravenous Every 8 hours 06/29/21 1205 06/29/21 1932   06/29/21 0800  ceFAZolin (ANCEF) IVPB 2g/100 mL premix        2 g 200 mL/hr over 30 Minutes Intravenous On call to O.R. 06/29/21 [id number] 06/29/21 0806   06/29/21 0751  ceFAZolin (ANCEF) 2-4 GM/100ML-% IVPB       Note to Pharmacy: Barbie Haggis N: cabinet override      06/29/21 0751 06/29/21 0819        I have personally reviewed the following labs and images: CBC: Recent Labs  Lab 07/10/21 0603 07/11/21 0503 07/13/21 1747 07/14/21 0542 07/15/21 0335  WBC 7.4 7.0 18.2* 23.4* 27.7*  NEUTROABS  --   --   --  18.2*  --   HGB 11.0* 11.3* 10.3* 10.9* 9.2*  HCT 31.4* 32.8* 31.5* 31.2* 27.4*  MCV 96.3 97.3 98.1 96.0 98.9  PLT 501* 465* 396 410* 373   BMP &GFR Recent Labs  Lab 07/09/21 0649 07/10/21 0603  07/11/21 0503 07/13/21 1747 07/14/21 0542 07/15/21 0335  NA 130* 133* 132*  --  128* 131*  K 3.9 3.9 3.7  --  4.7 4.9  CL 97* 100 99  --  94* 103  CO2 22 23 25   --  23 22  GLUCOSE 92 109* 113*  --  101* 106*  BUN 16 22* 23*  --  24* 24*  CREATININE 0.81 0.98 1.07 0.99 1.10 0.96  CALCIUM 9.5 10.3 9.8  --  9.4 8.4*  MG  --  2.1  --   --  2.1 2.2  PHOS  --  4.3  --   --  4.5 3.1   Estimated Creatinine Clearance: 64.9 mL/min (by C-G formula based on SCr of 0.96 mg/dL). Liver & Pancreas: Recent Labs  Lab 07/09/21 0649 07/10/21 0603 07/11/21 0503 07/14/21 0542 07/15/21 0335  AST 102* 85* 65* 36  --   ALT 189* 154* 128* 55*  --   ALKPHOS 95 92 93 116  --   BILITOT 1.2 0.9 0.8 1.5*  --   PROT 7.8 8.0 7.8 8.2*  --   ALBUMIN 2.9* 2.9* 2.9* 2.6* 1.9*   Recent Labs  Lab 07/15/21 0335  LIPASE 19   Recent Labs  Lab 07/15/21 0335  AMMONIA 41*   Diabetic: No results for input(s): HGBA1C in the last 72 hours. Recent Labs  Lab 07/13/21 1526 07/13/21 2341 07/14/21 0858 07/14/21 2321 07/15/21 0857  GLUCAP 123* 110* 110* 187* 130*   Cardiac Enzymes: Recent Labs  Lab 07/14/21 0542 07/15/21 0335  CKTOTAL 43* 78   No results for input(s): PROBNP in the last 8760 hours. Coagulation Profile: No results for input(s): INR, PROTIME in the last 168 hours. Thyroid Function Tests: No results for input(s): TSH, T4TOTAL, FREET4, T3FREE, THYROIDAB in the last 72 hours. Lipid Profile: No results for input(s): CHOL, HDL, LDLCALC, TRIG, CHOLHDL, LDLDIRECT in the last  72 hours. Anemia Panel: Recent Labs    07/14/21 0542  VITAMINB12 549  FOLATE 26.2  FERRITIN 719*  TIBC 203*  IRON 13*  RETICCTPCT 1.6   Urine analysis:    Component Value Date/Time   COLORURINE AMBER (A) 07/13/2021 1845   APPEARANCEUR HAZY (A) 07/13/2021 1845   LABSPEC 1.019 07/13/2021 1845   PHURINE 5.0 07/13/2021 1845   GLUCOSEU NEGATIVE 07/13/2021 1845   HGBUR LARGE (A) 07/13/2021 1845   BILIRUBINUR  NEGATIVE 07/13/2021 1845   KETONESUR NEGATIVE 07/13/2021 1845   PROTEINUR 30 (A) 07/13/2021 1845   NITRITE NEGATIVE 07/13/2021 1845   LEUKOCYTESUR NEGATIVE 07/13/2021 1845   Sepsis Labs: Invalid input(s): PROCALCITONIN, LACTICIDVEN  Microbiology: Recent Results (from the past 240 hour(s))  Culture, blood (x 2)     Status: None (Preliminary result)   Collection Time: 07/14/21  9:32 AM   Specimen: Left Antecubital; Blood  Result Value Ref Range Status   Specimen Description LEFT ANTECUBITAL  Final   Special Requests   Final    BOTTLES DRAWN AEROBIC AND ANAEROBIC Blood Culture adequate volume   Culture   Final    NO GROWTH < 24 HOURS Performed at [facility], [address], [place], [place] [address]    Report Status PENDING  Incomplete  Culture, blood (x 2)     Status: None (Preliminary result)   Collection Time: 07/14/21  9:37 AM   Specimen: Right Antecubital; Blood  Result Value Ref Range Status   Specimen Description RIGHT ANTECUBITAL  Final   Special Requests   Final    BOTTLES DRAWN AEROBIC AND ANAEROBIC Blood Culture adequate volume   Culture   Final    NO GROWTH < 24 HOURS Performed at [facility], [address] [place], [place] [address]    Report Status PENDING  Incomplete  Urine Culture     Status: None   Collection Time: 07/14/21  2:17 PM   Specimen: Urine, Catheterized  Result Value Ref Range Status   Specimen Description URINE, CATHETERIZED  Final   Special Requests NONE  Final   Culture   Final    NO GROWTH Performed at [facility], [address] [place], [place] [address]    Report Status 07/15/2021 FINAL  Final     Radiology Studies: DG Chest Port 1 View  Result Date: 07/15/2021 CLINICAL DATA:  Hypoxemia EXAM: PORTABLE CHEST 1 VIEW COMPARISON:  07/13/2021 FINDINGS: Cardiomegaly. New, diffuse bilateral heterogeneous and interstitial airspace opacity. Underlying emphysema. IMPRESSION: 1. New, diffuse bilateral heterogeneous and  interstitial airspace opacity, consistent with edema or infection. 2.  Emphysema. 3.  Cardiomegaly. Electronically Signed   By: Jearld Lesch M.D.   On: 07/15/2021 12:06   DG Abd Portable 1V  Result Date: 07/15/2021 CLINICAL DATA:  Hypoxemia. EXAM: PORTABLE ABDOMEN - 1 VIEW COMPARISON:  07/14/2021 FINDINGS: Unchanged appearance of moderate gas and fecal distension of the colon compared with the previous exam. No significant small bowel distension noted. Contrast material noted within the splenic flexure and rectum. Unchanged from previous study. IMPRESSION: No change in moderate gas and fecal distension of the colon . Electronically Signed   By: Signa Kell M.D.   On: 07/15/2021 12:09   VAS [place] LOWER EXTREMITY VENOUS (DVT)  Result Date: 07/15/2021  Lower Venous DVT Study Patient Name:  FREDDICK MAJKUT  Date of Exam:   07/15/2021 Medical Rec #: [id number]         Accession #:  [id number] Date of Birth: Dec 14, 1960        Patient Gender: M Patient Age:   60 years Exam Location:  [facility] Procedure:      VAS [place] LOWER EXTREMITY VENOUS (DVT) Referring Phys: Bretta Bang Quanah Majka --------------------------------------------------------------------------------  Indications: Pulmonary embolism.  Comparison Study: No prior study Performing Technologist: Sharion Dove RVS  Examination Guidelines: A complete evaluation includes B-mode imaging, spectral Doppler, color Doppler, and power Doppler as needed of all accessible portions of each vessel. Bilateral testing is considered an integral part of a complete examination. Limited examinations for reoccurring indications may be performed as noted. The reflux portion of the exam is performed with the patient in reverse Trendelenburg.  +---------+---------------+---------+-----------+----------+--------------+  RIGHT     Compressibility Phasicity Spontaneity Properties Thrombus Aging  +---------+---------------+---------+-----------+----------+--------------+  CFV        Full            Yes       Yes                                    +---------+---------------+---------+-----------+----------+--------------+  SFJ       Full                                                             +---------+---------------+---------+-----------+----------+--------------+  FV Prox   Full                                                             +---------+---------------+---------+-----------+----------+--------------+  FV Mid    Full                                                             +---------+---------------+---------+-----------+----------+--------------+  FV Distal Full                                                             +---------+---------------+---------+-----------+----------+--------------+  PFV       Full                                                             +---------+---------------+---------+-----------+----------+--------------+  POP       Full            Yes       Yes                                    +---------+---------------+---------+-----------+----------+--------------+  PTV       Full                                                             +---------+---------------+---------+-----------+----------+--------------+  PERO      Full                                                             +---------+---------------+---------+-----------+----------+--------------+   +---------+---------------+---------+-----------+----------+--------------+  LEFT      Compressibility Phasicity Spontaneity Properties Thrombus Aging  +---------+---------------+---------+-----------+----------+--------------+  CFV       Full            Yes       Yes                                    +---------+---------------+---------+-----------+----------+--------------+  SFJ       Full                                                             +---------+---------------+---------+-----------+----------+--------------+  FV Prox   Full                                                              +---------+---------------+---------+-----------+----------+--------------+  FV Mid    Full                                                             +---------+---------------+---------+-----------+----------+--------------+  FV Distal Full                                                             +---------+---------------+---------+-----------+----------+--------------+  PFV       Full                                                             +---------+---------------+---------+-----------+----------+--------------+  POP       Full            Yes       Yes                                    +---------+---------------+---------+-----------+----------+--------------+  PTV       Full                                                             +---------+---------------+---------+-----------+----------+--------------+  PERO      Full                                                             +---------+---------------+---------+-----------+----------+--------------+     Summary: BILATERAL: - No evidence of deep vein thrombosis seen in the lower extremities, bilaterally. -No evidence of popliteal cyst, bilaterally.   *See table(s) above for measurements and observations.    Preliminary      CRITICAL CARE Performed by: Mercy Riding   Total critical care time: 65 minutes  Critical care time was exclusive of separately billable procedures and treating other patients.  Critical care was necessary to treat or prevent imminent or life-threatening deterioration.  Critical care was time spent personally by me on the following activities: development of treatment plan with patient and/or surrogate as well as nursing, discussions with consultants, evaluation of patient's response to treatment, examination of patient, obtaining history from patient or surrogate, ordering and performing treatments and interventions, ordering and review of laboratory studies, ordering and review of  radiographic studies, pulse oximetry and re-evaluation of patient's condition.   Melquisedec Journey T. C[place]  If 7PM-7AM, please contact night-coverage [email or web address] 07/15/2021, 1:41 PM

## 2021-07-15 NOTE — Progress Notes (Addendum)
ANTICOAGULATION CONSULT NOTE  Pharmacy Consult for bivalirudin Indication: pulmonary embolus  No Known Allergies  Patient Measurements: Height: 5\' 10"  (177.8 cm) Weight: 56.1 kg (123 lb 10.9 oz) IBW/kg (Calculated) : 73 Heparin Dosing Weight: TBW  Vital Signs: Temp: 100.1 F (37.8 C) (02/19 2000) Temp Source: Axillary (02/19 2000) BP: 92/71 (02/19 1853) Pulse Rate: 118 (02/19 1853)  Labs: Recent Labs    07/13/21 1314 07/13/21 1747 07/13/21 1747 07/14/21 0542 07/14/21 1929 07/15/21 0335 07/15/21 1237 07/15/21 1549 07/15/21 1555 07/15/21 1921  HGB  --  10.3*   < > 10.9*  --  9.2*  --   --  9.9*  --   HCT  --  31.5*   < > 31.2*  --  27.4*  --   --  29.0*  --   PLT  --  396  --  410*  --  373  --   --   --   --   APTT  --   --   --   --   --   --   --   --   --  81*  HEPARINUNFRC  --   --   --   --  <0.10* <0.10* <0.10*  --   --   --   CREATININE  --  0.99  --  1.10  --  0.96  --   --   --   --   CKTOTAL  --   --   --  43*  --  78  --   --   --   --   TROPONINIHS 22* 137*  --   --   --   --   --  111*  --   --    < > = values in this interval not displayed.     Estimated Creatinine Clearance: 64.9 mL/min (by C-G formula based on SCr of 0.96 mg/dL).   Medical History: History reviewed. No pertinent past medical history.   Assessment: 59 yom s/p ACDF then exploration and evacuation of epidural hematoma. He is on antibiotics for PNA. He has on heparin but heparin levels were undetectable  x3 and he was changed to bivalirudin today.  -aPTT at goal on bivalirudin at 0.15 mcg/kg/min   Goal of Therapy:  Goal aPTT 55-80 Monitor platelets by anticoagulation protocol: Yes   Plan:  -Continue bivalirudin at 0.15 mcg/kg/min -Daily aPTT and CBC  Hildred Laser, PharmD Clinical Pharmacist **Pharmacist phone directory can now be found on [email or web address] ([email or web address]).  Listed under [place].

## 2021-07-15 NOTE — Progress Notes (Signed)
Pt oxygen saturation sustaining 84% on heated hi-flow. HR 140s. RR 30s. RT called. Pt placed back on NRB, PRN metoprolol given.

## 2021-07-16 ENCOUNTER — Inpatient Hospital Stay (HOSPITAL_COMMUNITY): Payer: 59

## 2021-07-16 DIAGNOSIS — G959 Disease of spinal cord, unspecified: Secondary | ICD-10-CM | POA: Diagnosis not present

## 2021-07-16 DIAGNOSIS — E871 Hypo-osmolality and hyponatremia: Secondary | ICD-10-CM

## 2021-07-16 DIAGNOSIS — G9341 Metabolic encephalopathy: Secondary | ICD-10-CM | POA: Diagnosis not present

## 2021-07-16 DIAGNOSIS — J690 Pneumonitis due to inhalation of food and vomit: Secondary | ICD-10-CM | POA: Diagnosis not present

## 2021-07-16 DIAGNOSIS — R1312 Dysphagia, oropharyngeal phase: Secondary | ICD-10-CM

## 2021-07-16 DIAGNOSIS — E43 Unspecified severe protein-calorie malnutrition: Secondary | ICD-10-CM

## 2021-07-16 DIAGNOSIS — R778 Other specified abnormalities of plasma proteins: Secondary | ICD-10-CM

## 2021-07-16 DIAGNOSIS — R008 Other abnormalities of heart beat: Secondary | ICD-10-CM

## 2021-07-16 DIAGNOSIS — D72825 Bandemia: Secondary | ICD-10-CM

## 2021-07-16 DIAGNOSIS — R5381 Other malaise: Secondary | ICD-10-CM

## 2021-07-16 DIAGNOSIS — K5900 Constipation, unspecified: Secondary | ICD-10-CM

## 2021-07-16 DIAGNOSIS — R000 Tachycardia, unspecified: Secondary | ICD-10-CM

## 2021-07-16 DIAGNOSIS — R739 Hyperglycemia, unspecified: Secondary | ICD-10-CM

## 2021-07-16 DIAGNOSIS — E440 Moderate protein-calorie malnutrition: Secondary | ICD-10-CM

## 2021-07-16 DIAGNOSIS — F17200 Nicotine dependence, unspecified, uncomplicated: Secondary | ICD-10-CM

## 2021-07-16 DIAGNOSIS — J189 Pneumonia, unspecified organism: Secondary | ICD-10-CM

## 2021-07-16 DIAGNOSIS — R079 Chest pain, unspecified: Secondary | ICD-10-CM

## 2021-07-16 DIAGNOSIS — R748 Abnormal levels of other serum enzymes: Secondary | ICD-10-CM

## 2021-07-16 LAB — CBC WITH DIFFERENTIAL/PLATELET
Abs Immature Granulocytes: 0.17 10*3/uL — ABNORMAL HIGH (ref 0.00–0.07)
Basophils Absolute: 0.1 10*3/uL (ref 0.0–0.1)
Basophils Relative: 0 %
Eosinophils Absolute: 0.4 10*3/uL (ref 0.0–0.5)
Eosinophils Relative: 2 %
HCT: 23.9 % — ABNORMAL LOW (ref 39.0–52.0)
Hemoglobin: 8.3 g/dL — ABNORMAL LOW (ref 13.0–17.0)
Immature Granulocytes: 1 %
Lymphocytes Relative: 3 %
Lymphs Abs: 0.9 10*3/uL (ref 0.7–4.0)
MCH: 33.9 pg (ref 26.0–34.0)
MCHC: 34.7 g/dL (ref 30.0–36.0)
MCV: 97.6 fL (ref 80.0–100.0)
Monocytes Absolute: 1.2 10*3/uL — ABNORMAL HIGH (ref 0.1–1.0)
Monocytes Relative: 5 %
Neutro Abs: 23.3 10*3/uL — ABNORMAL HIGH (ref 1.7–7.7)
Neutrophils Relative %: 89 %
Platelets: 389 10*3/uL (ref 150–400)
RBC: 2.45 MIL/uL — ABNORMAL LOW (ref 4.22–5.81)
RDW: 14.5 % (ref 11.5–15.5)
WBC: 25.9 10*3/uL — ABNORMAL HIGH (ref 4.0–10.5)
nRBC: 0 % (ref 0.0–0.2)

## 2021-07-16 LAB — RENAL FUNCTION PANEL
Albumin: <1.5 g/dL — ABNORMAL LOW (ref 3.5–5.0)
Anion gap: 8 (ref 5–15)
BUN: 21 mg/dL — ABNORMAL HIGH (ref 6–20)
CO2: 22 mmol/L (ref 22–32)
Calcium: 8.4 mg/dL — ABNORMAL LOW (ref 8.9–10.3)
Chloride: 105 mmol/L (ref 98–111)
Creatinine, Ser: 0.94 mg/dL (ref 0.61–1.24)
GFR, Estimated: >60 mL/min (ref >60)
Glucose, Bld: 98 mg/dL (ref 70–99)
Phosphorus: 2.7 mg/dL (ref 2.5–4.6)
Potassium: 4 mmol/L (ref 3.5–5.1)
Sodium: 135 mmol/L (ref 135–145)

## 2021-07-16 LAB — STREP PNEUMONIAE URINARY ANTIGEN: Strep Pneumo Urinary Antigen: NEGATIVE

## 2021-07-16 LAB — CBC
HCT: 21.2 % — ABNORMAL LOW (ref 39.0–52.0)
Hemoglobin: 7.3 g/dL — ABNORMAL LOW (ref 13.0–17.0)
MCH: 34.1 pg — ABNORMAL HIGH (ref 26.0–34.0)
MCHC: 34.4 g/dL (ref 30.0–36.0)
MCV: 99.1 fL (ref 80.0–100.0)
Platelets: 384 10*3/uL (ref 150–400)
RBC: 2.14 MIL/uL — ABNORMAL LOW (ref 4.22–5.81)
RDW: 14.6 % (ref 11.5–15.5)
WBC: 25.8 10*3/uL — ABNORMAL HIGH (ref 4.0–10.5)
nRBC: 0 % (ref 0.0–0.2)

## 2021-07-16 LAB — LACTIC ACID, PLASMA: Lactic Acid, Venous: 1.3 mmol/L (ref 0.5–1.9)

## 2021-07-16 LAB — ECHOCARDIOGRAM COMPLETE
Area-P 1/2: 8.62 cm2
Height: 70 in
S' Lateral: 2.4 cm
Single Plane A4C EF: 53.9 %
Weight: 1978.85 oz

## 2021-07-16 LAB — HEPATIC FUNCTION PANEL
ALT: 23 U/L (ref 0–44)
AST: 32 U/L (ref 15–41)
Albumin: <1.5 g/dL — ABNORMAL LOW (ref 3.5–5.0)
Alkaline Phosphatase: 88 U/L (ref 38–126)
Bilirubin, Direct: 0.7 mg/dL — ABNORMAL HIGH (ref 0.0–0.2)
Indirect Bilirubin: 0.6 mg/dL (ref 0.3–0.9)
Total Bilirubin: 1.3 mg/dL — ABNORMAL HIGH (ref 0.3–1.2)
Total Protein: 5.9 g/dL — ABNORMAL LOW (ref 6.5–8.1)

## 2021-07-16 LAB — TRIGLYCERIDES: Triglycerides: 710 mg/dL — ABNORMAL HIGH (ref <150)

## 2021-07-16 LAB — TROPONIN I (HIGH SENSITIVITY): Troponin I (High Sensitivity): 55 ng/L — ABNORMAL HIGH (ref <18)

## 2021-07-16 LAB — MAGNESIUM: Magnesium: 1.8 mg/dL (ref 1.7–2.4)

## 2021-07-16 LAB — APTT: aPTT: 71 seconds — ABNORMAL HIGH (ref 24–36)

## 2021-07-16 IMAGING — DX DG ABD PORTABLE 1V
1 series · 1 of 1 positions shown · non-contrast
Comparison: [DATE]

CLINICAL DATA: Nasogastric tube check.

EXAM:
PORTABLE ABDOMEN - 1 VIEW

[abdomen]
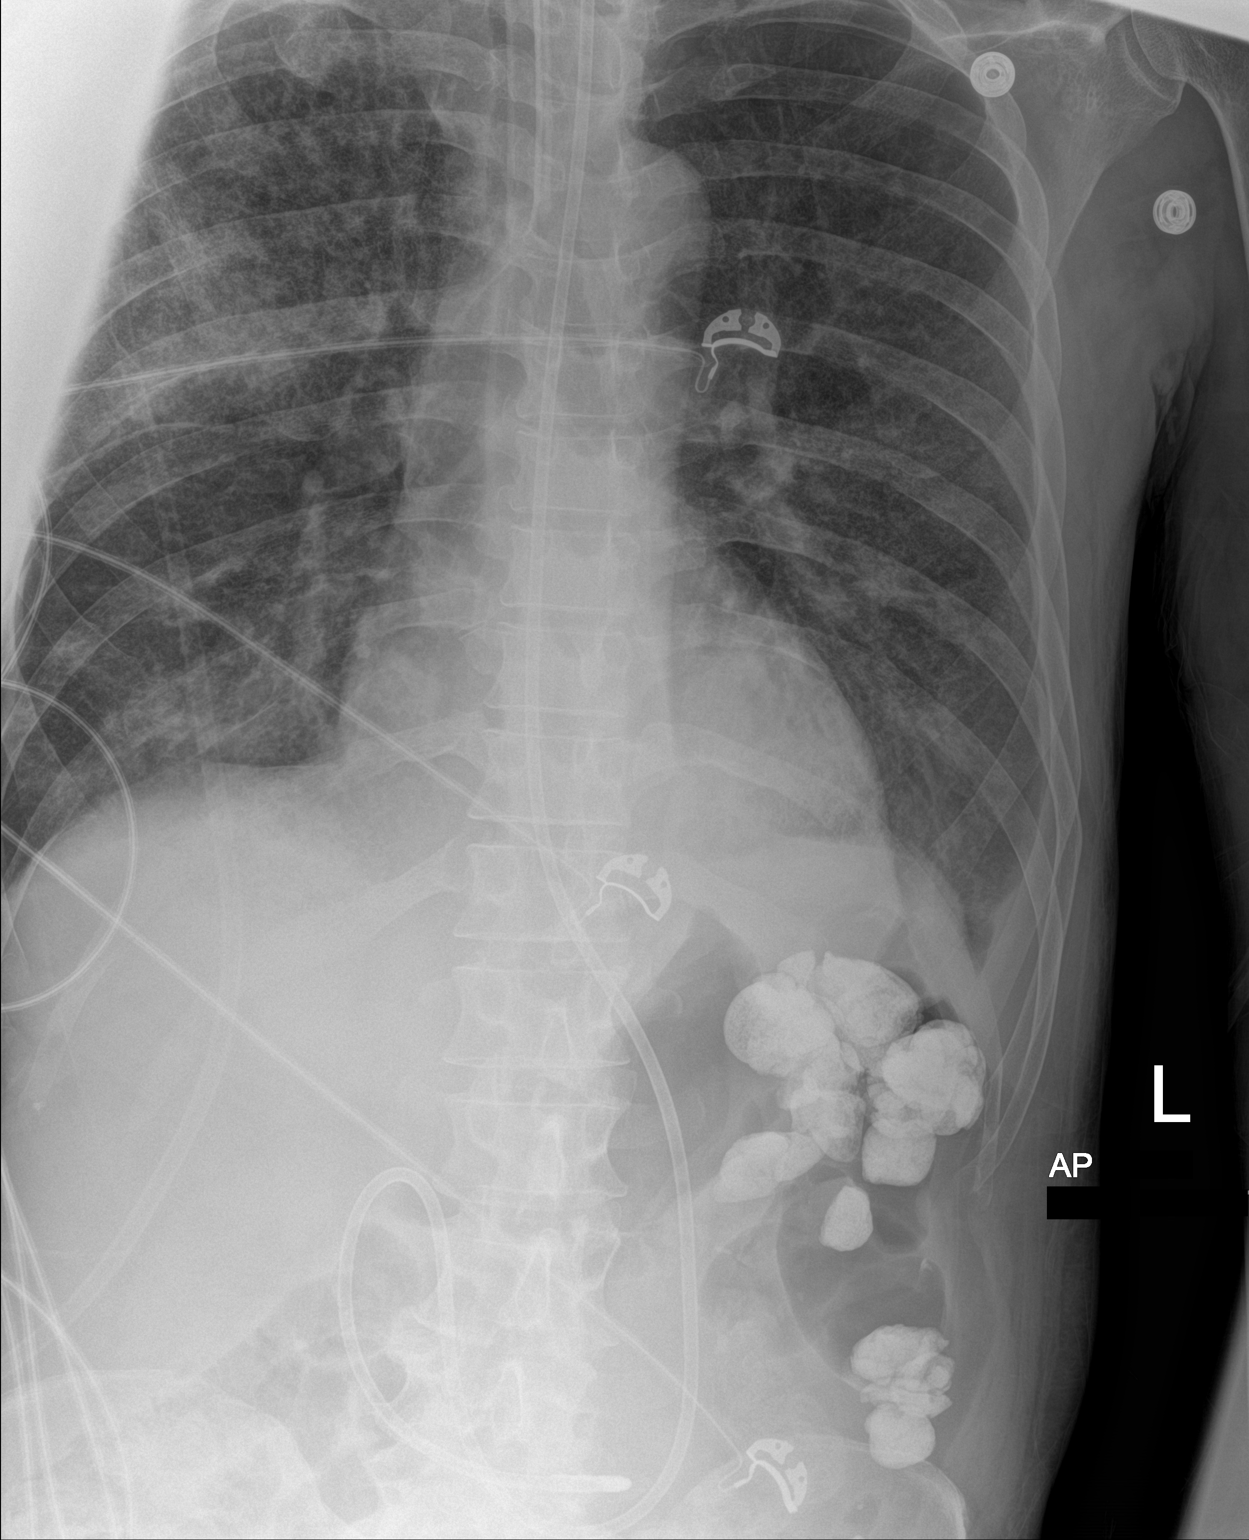

[1 of 1 positions shown; findings below may reference images not displayed]

FINDINGS: Endotracheal tube is in place, tip approximately 4.5 centimeters
above the carina. Feeding tube has been placed, tip overlying the
level of the horizontal portion of the duodenum. Less likely, the
tube could be coiled in the stomach, redirected towards the mid
stomach. There is residual contrast in colon. Patchy opacity
identified in the RIGHT UPPER lobe and both LOWER lobes, consistent
with infectious infiltrate.
IMPRESSION: Feeding tube tip likely within the horizontal portion of the
duodenum.

Bilateral pulmonary infiltrates.

## 2021-07-16 IMAGING — DX DG CHEST 1V PORT
1 series · 1 of 1 positions shown · non-contrast
Comparison: [DATE] and CT chest [DATE].

CLINICAL DATA: Respiratory failure.

EXAM:
PORTABLE CHEST 1 VIEW

[chest]
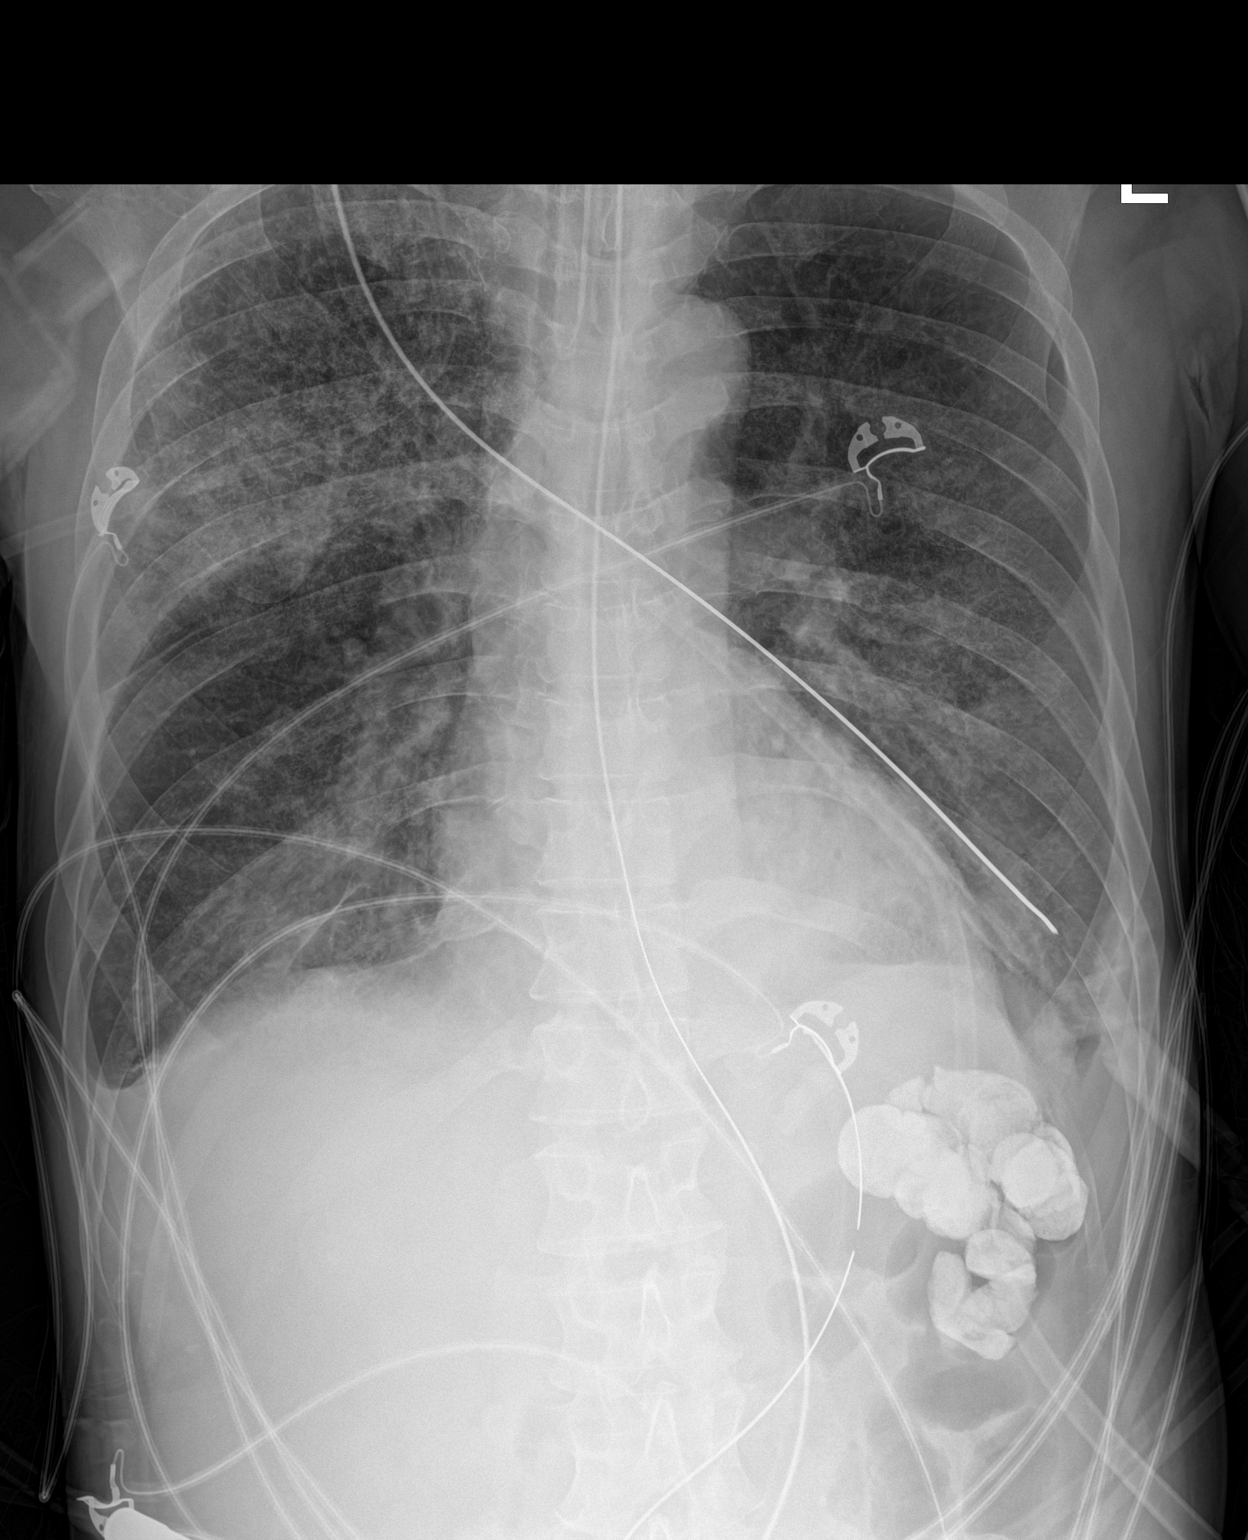

[1 of 1 positions shown; findings below may reference images not displayed]

FINDINGS: Endotracheal tube terminates approximately 4.9 cm above the carina.
Nasogastric tube is followed into the stomach with the tip
projecting beyond the inferior margin of the image. Heart size
normal. Hazy opacification bilaterally, worst in the right upper
lobe. Small bilateral pleural effusions. Findings are similar to
yesterday's exam.
IMPRESSION: Multilobar pneumonia with small bilateral pleural effusions, similar
to yesterday.

## 2021-07-16 MED ORDER — FUROSEMIDE 10 MG/ML IJ SOLN
40.0000 mg | Freq: Once | INTRAMUSCULAR | Status: AC
  Administered 2021-07-16: 40 mg via INTRAVENOUS
  Filled 2021-07-16: qty 4

## 2021-07-16 MED ORDER — LACTATED RINGERS IV BOLUS
500.0000 mL | Freq: Once | INTRAVENOUS | Status: AC
  Administered 2021-07-16: 500 mL via INTRAVENOUS

## 2021-07-16 MED ORDER — MAGNESIUM SULFATE 2 GM/50ML IV SOLN
INTRAVENOUS | Status: AC
  Filled 2021-07-16: qty 50

## 2021-07-16 MED ORDER — PROSOURCE TF PO LIQD
45.0000 mL | Freq: Two times a day (BID) | ORAL | Status: DC
  Administered 2021-07-16 – 2021-07-18 (×5): 45 mL
  Filled 2021-07-16 (×5): qty 45

## 2021-07-16 MED ORDER — SODIUM CHLORIDE 0.9 % IV SOLN
250.0000 mL | INTRAVENOUS | Status: DC
Start: 2021-07-16 — End: 2021-11-03
  Administered 2021-07-20 – 2021-08-20 (×2): 250 mL via INTRAVENOUS

## 2021-07-16 MED ORDER — MAGNESIUM SULFATE 2 GM/50ML IV SOLN
2.0000 g | Freq: Once | INTRAVENOUS | Status: AC
  Administered 2021-07-16: 2 g via INTRAVENOUS

## 2021-07-16 MED ORDER — QUETIAPINE FUMARATE 25 MG PO TABS
25.0000 mg | ORAL_TABLET | Freq: Every day | ORAL | Status: DC
  Administered 2021-07-16: 25 mg
  Filled 2021-07-16: qty 1

## 2021-07-16 MED ORDER — VITAL HIGH PROTEIN PO LIQD
1000.0000 mL | ORAL | Status: DC
  Administered 2021-07-16: 1000 mL

## 2021-07-16 MED ORDER — NOREPINEPHRINE 4 MG/250ML-% IV SOLN
2.0000 ug/min | INTRAVENOUS | Status: DC
  Administered 2021-07-16 – 2021-07-17 (×2): 2 ug/min via INTRAVENOUS
  Administered 2021-07-18: 5 ug/min via INTRAVENOUS
  Administered 2021-07-20 (×2): 3 ug/min via INTRAVENOUS
  Filled 2021-07-16 (×5): qty 250

## 2021-07-16 MED ORDER — DEXMEDETOMIDINE HCL IN NACL 400 MCG/100ML IV SOLN
0.4000 ug/kg/h | INTRAVENOUS | Status: DC
  Administered 2021-07-16: 0.4 ug/kg/h via INTRAVENOUS
  Administered 2021-07-16 – 2021-07-21 (×19): 1.2 ug/kg/h via INTRAVENOUS
  Administered 2021-07-21: 1 ug/kg/h via INTRAVENOUS
  Administered 2021-07-21: 1.2 ug/kg/h via INTRAVENOUS
  Administered 2021-07-22: 1 ug/kg/h via INTRAVENOUS
  Administered 2021-07-22: 1.2 ug/kg/h via INTRAVENOUS
  Administered 2021-07-22: 0.9 ug/kg/h via INTRAVENOUS
  Administered 2021-07-22: 1.2 ug/kg/h via INTRAVENOUS
  Administered 2021-07-23: 0.9 ug/kg/h via INTRAVENOUS
  Administered 2021-07-23: 1.1 ug/kg/h via INTRAVENOUS
  Administered 2021-07-23: 1 ug/kg/h via INTRAVENOUS
  Administered 2021-07-24: 1.1 ug/kg/h via INTRAVENOUS
  Filled 2021-07-16: qty 100
  Filled 2021-07-16: qty 200
  Filled 2021-07-16 (×25): qty 100

## 2021-07-16 NOTE — Progress Notes (Signed)
OT Cancellation Note  Patient Details Name: Brian [name] MRN: [id number] DOB: 08-27-1960   Cancelled Treatment:    Reason Eval/Treat Not Completed: Medical issues which prohibited therapy (Medical decline. Discussed with nsg. Transferred to ICU. Currently sedated and intubated. Will follow up later time when medically stable.)  Hoffman, OT/L   Acute OT Clinical Specialist [place] Pager [phone] Office [phone]   07/16/2021, 12:09 PM

## 2021-07-16 NOTE — Progress Notes (Signed)
°  Echocardiogram 2D Echocardiogram has been performed.  [name] 07/16/2021, 8:46 AM

## 2021-07-16 NOTE — Progress Notes (Signed)
SLP Cancellation Note  Patient Details Name: Brian [name] MRN: [id number] DOB: [birth year]   Cancelled treatment:        Pt intubated. ST will follow.    [name] 07/16/2021, 8:30 AM Breck Coons Lonell Face.Ed Nurse, children's [phone] Office [phone]

## 2021-07-16 NOTE — Procedures (Signed)
Cortrak  Person Inserting Tube:  Kendell Bane C, RD Tube Type:  Cortrak - 43 inches Tube Size:  10 Tube Location:  Left nare Initial Placement:  Stomach Secured by: Bridle Technique Used to Measure Tube Placement:  Marking at nare/corner of mouth Cortrak Secured At:  93 cm  Cortrak Tube Team Note:  Consult received to place a Cortrak feeding tube.   X-ray is required, abdominal x-ray has been ordered by the Cortrak team. Please confirm tube placement before using the Cortrak tube.   If the tube becomes dislodged please keep the tube and contact the Cortrak team at [email or web address] ([email or web address]) for replacement.  If after hours and replacement cannot be delayed, place a NG tube and confirm placement with an abdominal x-ray.    Cammy Copa., RD, LDN, CNSC See AMiON for contact information

## 2021-07-16 NOTE — Progress Notes (Signed)
ANTICOAGULATION CONSULT NOTE  Pharmacy Consult for bivalirudin Indication: pulmonary embolus  No Known Allergies  Patient Measurements: Height: 5\' 10"  (177.8 cm) Weight: 56.1 kg (123 lb 10.9 oz) IBW/kg (Calculated) : 73 Heparin Dosing Weight: TBW  Vital Signs: Temp: 100 F (37.8 C) (02/20 0742) Temp Source: Axillary (02/20 0742) BP: 102/67 (02/20 0815) Pulse Rate: 125 (02/20 0815)  Labs: Recent Labs    07/13/21 1747 07/14/21 0542 07/14/21 1929 07/15/21 0335 07/15/21 1237 07/15/21 1549 07/15/21 1555 07/15/21 1921 07/16/21 0718  HGB 10.3* 10.9*  --  9.2*  --   --  9.9*  --  7.3*  HCT 31.5* 31.2*  --  27.4*  --   --  29.0*  --  21.2*  PLT 396 410*  --  373  --   --   --   --  384  APTT  --   --   --   --   --   --   --  81* 71*  HEPARINUNFRC  --   --  <0.10* <0.10* <0.10*  --   --   --   --   CREATININE 0.99 1.10  --  0.96  --   --   --   --   --   CKTOTAL  --  43*  --  78  --   --   --   --   --   TROPONINIHS 137*  --   --   --   --  111*  --  98*  --      Estimated Creatinine Clearance: 64.9 mL/min (by C-G formula based on SCr of 0.96 mg/dL).   Medical History: History reviewed. No pertinent past medical history.   Assessment: 78 yom s/p ACDF then exploration and evacuation of epidural hematoma. He is on antibiotics for PNA. He has on heparin but heparin levels were undetectable  x3 and he was changed to bivalirudin.  This AM aPTT is therapeutic at 71 seconds on 0.15 mg/kg/min  Goal of Therapy:  Goal aPTT 55-80 Monitor platelets by anticoagulation protocol: Yes   Plan:  Continue bivalirudin at 0.15 mg/kg/min Daily aPTT, CBC, s/s bleeding  Bertis Ruddy, PharmD Clinical Pharmacist ED Pharmacist Phone # [phone] 07/16/2021 8:30 AM a

## 2021-07-16 NOTE — Progress Notes (Signed)
Family [name], niece, and pt's mother) updated on pt's plan of care by RN. Pt's niece and mother were updated by APP at pt's bedside.

## 2021-07-16 NOTE — Progress Notes (Addendum)
NAME:  Brian [name], MRN:  [id number], DOB:  1961-05-01, LOS: 59 ADMISSION DATE:  06/29/2021, CONSULTATION DATE:  07/03/21 REFERRING MD:  Reinaldo Meeker, NP, CHIEF COMPLAINT:  AMS   History of Present Illness:   61 year old male with prior history of tobacco abuse and ETOH use who recently was found to have critical multilevel cervical spinal stenosis C3-6 with spinal cord compression and spinal cord signal change after recent fall with progressive upper and lower extremity weakness and tremors.  He was admitted on 2/3 and underwent ACDF of C3-4, C4-5, C5-5, and C6-7.  Post-operatively he was progressing slowly with residual weakness in both hands but improving lower extremity strength and function.  He developed some difficulty swallowing on 2/5 and SLP ordered and placed on thickened liquids.   Overnight 2/6, patient with increased difficulty swallowing and was made NPO but also noted to have dysarthria and difficulty moving extremities with numbness.  SLP evaluated and found to be moderate aspiration risk.   Also progressively tachycardic, tachypneic, and now febrile.  Code stroke activated and taken for CT/ CTA head and neck and was given decadron 10mg  once.  NIHSS 18.  CTH was negative for acute findings, and CTA head neck did not reveal any LVO but noted significant prevertebral soft tissue swelling with foci of gas present at the ventral epidural space at C4-5, small collection not excluded.  On 2/7, patient with worsening confusion and concern for airway involvement, PCCM consulted for further evaluation.   Pertinent  Medical History  Tobacco abuse, cervical stenosis, ETOH use   Significant Hospital Events: Including procedures, antibiotic start and stop dates in addition to other pertinent events   2/3 ACDF C3-4, C4-5, C5-5, and C6-7 w/ Dr. Annette Stable 2/6 SLP eval for difficulty swallowing 2/7 Code stroke overnight, neg for LVO, CT showing soft tissue swelling.  PCCM consulted for concern of airway  management and AMS.  Went for evacuation of hematoma   Images  2/7 CT angio head and neck > No large vessel occlusion, hemodynamically significant stenosis, or evidence of dissection. Significant prevertebral soft tissue swelling. Foci of gas are present the ventral epidural space at C4-C5; a small collection is not excluded.  Interim History / Subjective:  CT abd/ pelvis 2/18  showing bilateral segmental Pes, started on heparin and showing worsening pneumonia, febrile to  103.  PCT rising, 34.6, restarted on abx 2/18.  Desaturation 2/19 after taking orals today, on NRB, HHFNC attempted.  Had been on dysphagia 2 diet Intubated 2/19 and transferred to ICU bed  Switching from heparin to angiomax 2/19 given subtherapeutic levels despite increase in rate. HGB drop 2/20 am to 7.3 from 9.9, no obvious signs of bleeding>> Platelets are 384, WBC is 25.8, Febrile at 100.5 Very tachycardic to the 130's now and overnight  Episodes of hypotension overnight , received 2 IVF boluses of 500 cc each Triglycerides are elevated at 710 Na 135/ K 4.0/ BUN 21/ Creatinine 0.94, Albumin < 1.5, Mag 1.8/ Total Bili 1.3/ Direct Bili 0.7 + 350 cc's , UO 1250 last 24 hours  Echo 07/16/2021 shows normal LV systolic function. There was notation of a + McConnell's sign c/w a large pulmonary embolus which is consistent with the finding of bilateral segmental pulmonary emboli noted on CT 07/14/2021. Pt. Is being treated with anti-coags.   Objective   Blood pressure (!) 89/59, pulse (!) 121, temperature 100 F (37.8 C), temperature source Axillary, resp. rate (!) 24, height 5\' 10"  (1.778 m), weight  56.1 kg, SpO2 100 %.    Vent Mode: PRVC FiO2 (%):  [40 %-100 %] 40 % Set Rate:  [24 bmp] 24 bmp Vt Set:  [580 mL] 580 mL PEEP:  [5 cmH20-8 cmH20] 8 cmH20 Plateau Pressure:  [21 cmH20-25 cmH20] 24 cmH20   Intake/Output Summary (Last 24 hours) at 07/16/2021 [id number] Last data filed at 07/16/2021 0800 Gross per 24 hour  Intake  2658.67 ml  Output 1375 ml  Net 1283.67 ml   Filed Weights   07/11/21 0500 07/12/21 0500 07/13/21 [id number]  Weight: 56.3 kg 56.3 kg 56.1 kg   Examination: General:  thin and ill appearing adult male sedated and intubated , thrashing head from side to side HEENT:  pupils 2/reactive, soft cervical collar in place, ETT secure and intact Neuro:  sedated , stuck out tongue to command, but not following any other commands  MAE- no appreciated weakness CV:  S1, S2, RRR, rare PVC per tele, No RMG PULM:  Bilateral chest excursion, Coarse throughout, secretions suspicious for bile, purulent GI: distended, , NT,  BS diminished  Extremities: No obvious deformities, warm/dry, no LE edema  Skin: no rashes , no lesions, skin is warm dry and intact  Resolved Hospital Problem list    Assessment & Plan:   Acute hypoxic respiratory failure secondary to recurrent aspiration pneumonia Dysphagia  Bilateral segmental PE (new 2/18) Overbreathing vent 2/20 P:  - ABG now and prn - sent trach asp, urine strep - full MV support, PRVC 8cc/kg IBW with goal Pplat <30 and DP<15  -VAP prevention protocol/ PPI -Will stop propofol and start precedex for sedation > fentanyl gtt > RASS goal -1, scheduled bowel regimen -wean FiO2 as able for SpO2 >92%  -daily SAT & SBT when appropiate - continue cefepime and vanc.  D/c flagyl.  Repeat MRSA PCR, if neg can stop vanc - changing heparin to angiomax given sub-therapeutic levels despite increases (also, heparin started no bolus on 2/18 given recent hematoma evac on 2/7).  - Echo done, but not read.  BNP 1178 on 2/19, trop hs 137 2/18  suggestive of RV involvement.  Troponin 111,  Will trend, BLE dupplex neg for DVT 2/19.  - will need cortrak for recurrent aspiration and ongoing SLP when able   Tachycardia with rare PVC Plan Troponin now and in am  Will give 2 grams of mag for mag of 1.8 Increase sedation while maintaining MAP of > 65 mm Hg  Sepsis secondary to  aspiration pneumonia  Intermittent Hypotension overnight with tachycardia into 130's  Urine Strep antigen negative Legionella pending P:  No further diuresis.    Overall negative net balance + 350 cc's . Goal MAP > 65 - assess lactic and trend until WNL - Trend fever and WBC - Trend PCT - ABX as above, narrow when able   Severe cervical spinal stenosis with spinal cord compression s/p ACDF C3-C7 on 2/3 c/b epidural hematoma s/p evacuation 2/7 Exam drastically improved s/p evacuation.  P: - per NSGY, continue soft cervical collar  Daily tobacco use  Occasional ETOH use P: - empiric thiamine/ folate/ MVI when enteral established - Tobacco cessation education when appropriate   Hyponatremia Na 135 on 2/120 Trend CMET   Constipation vs fecal impaction - no other acute findings on CT other than large stool burden in colon - cont fleets, dulcolax and miralax - Cortrack for feeds  Normocytic anemia Drop in HGB overnight from  9.9 to 7.3 on anticoagulation  No obvious signs  of bleeding - monitor CBC, transfuse for < 7 - Monitor for any obvious signs of bleeding  Protein calorie malnutrition - TF per RD recs - Cortrack   Elevated Triglycerides -Will d/c Propofol -Start Precedex  Agitation  ETOH abuse Drug Abuse Hx Plan Precedex/ will start Seroquel EKG now to check QTc  Best Practice (right click and "Reselect all SmartList Selections" daily)   Diet/type: NPO DVT prophylaxis: other angiomax GI prophylaxis: PPI Lines: N/A Foley:  Yes, and it is still needed Code Status:  full code Last date of multidisciplinary goals of care discussion:   Updated Mother and niece at bedside .  Consider PMT consult for longterm goals of care.   Critical care time: 58 mins   Magdalen Spatz, MSN, AGACNP-BC [place] for personal pager PCCM on call pager [phone]  [facility] 07/16/2021, 9:14 AM If no  response to pager, please call PCCM consult pager After 7:00 pm call Elink

## 2021-07-16 NOTE — Progress Notes (Signed)
Events of yesterday noted and I appreciate critical care's management.  Patient developed respiratory distress/failure yesterday requiring intubation.  Work-up consistent with aspiration pneumonia but also with pulmonary embolism.  Patient currently on broad-spectrum antibiotics and anticoagulation.  He is intubated.  His neurologic status appears stable.  Still has better than antigravity strength in both upper and lower extremities.  His wound is clean and dry.  Nothing to suggest wound infection at this point.  Continue with current management.

## 2021-07-16 NOTE — Progress Notes (Signed)
PT Cancellation Note  Patient Details Name: Brian [name] MRN: [id number] DOB: 12-Oct-1960   Cancelled Treatment:    Reason Eval/Treat Not Completed: Medical issues which prohibited therapy (Pt intubated yesterday.  Nurse asked PT to HOLD.  Will check back at later date.)   Bevelyn Buckles 07/16/2021, 11:55 AM Jacquette Canales M,PT Acute Rehab Services [phone] (pager)

## 2021-07-16 NOTE — Progress Notes (Signed)
Nutrition Follow-up  DOCUMENTATION CODES:   Non-severe (moderate) malnutrition in context of social or environmental circumstances  INTERVENTION:   Once cortrak is placed:  Initiate Vital High Protein @ 40 ml/hr  45 ml Prosource TF BID.    Tube feeding regimen provides 1040 kcals, 105 grams of protein, and 803 ml of H2O.    TF + propofol provides 1395 kcals  NUTRITION DIAGNOSIS:   Moderate Malnutrition (in the context of social/environmental circumstances) related to  (inadequate energy intake) as evidenced by mild fat depletion, moderate muscle depletion, severe muscle depletion.  Ongoing  GOAL:   Patient will meet greater than or equal to 90% of their needs  Progressing   MONITOR:   TF tolerance, Diet advancement, Labs  REASON FOR ASSESSMENT:   Consult Assessment of nutrition requirement/status, Enteral/tube feeding initiation and management  ASSESSMENT:   61 year old male with hx EtOH abuse, tobacco use, and spinal stenosis initially presented 2/3 for planned multilevel anterior cervical decompression and fusion surgery after experiencing progressive bilateral upper and lower extremity weakness and spasticity due to critical multilevel cervical spinal stenosis with spinal cord compression and signal change. Post-op recovery was complicated and was ultimately taken back to OR 2/7  02/03 - s/p anterior cervical discectomy with interbody fusion  02/05 - pt initially complained of worsening swallowing function 02/06 - MBS, NPO per SLP 02/07 - Code Stroke activated (negative) but transferred to ICU, s/p re-exploration anterior cervical fusion with evacuation of epidural hematoma 02/08 - Cortrak tube placed (tip gastric), tube feeds initiated 02/10 - MBS, diet advanced to dysphagia 2 with nectar-thick liquids, Cortrak removed 02/11 - NPO 02/13 - diet advanced to dysphagia 2 with nectar-thick liquids 02/16 - diet advanced to dysphagia 2 with thin liquids 02/19-  intubated after desaturation, possibly from PO intake  Patient is currently intubated on ventilator support. OGT currently connected to low, intermittent suction.  MV: 18.1 L/min Temp (24hrs), Avg:99 F (37.2 C), Min:98 F (36.7 C), Max:100.1 F (37.8 C)  Propofol: 13.46 ml/hr (provides 355 kcals)   Reviewed I/O's: +777 ml x 24 hours and -2.9 L since 07/02/21  UOP: 1.3 L x 24 hours  Per PCCM notes, requesting cortrak tube secondary to high aspiration risk. RD received cosnult to start TF today.    Medications reviewed and include colace, miralax, senokot, precedex, and magnesium sulfate.   Labs reviewed.   Diet Order:   Diet Order             Diet NPO time specified  Diet effective now                   EDUCATION NEEDS:   Education needs have been addressed  Skin:  Skin Assessment: Reviewed RN Assessment (surgical incisions to the neck)  Last BM:  07/08/21  Height:   Ht Readings from Last 1 Encounters:  07/15/21 5\' 10"  (1.778 m)    Weight:   Wt Readings from Last 1 Encounters:  07/13/21 56.1 kg    Ideal Body Weight:  78.2 kg  BMI:  Body mass index is 17.75 kg/m.  Estimated Nutritional Needs:   Kcal:  1800-2000 kcal/d  Protein:  90-105 g/d  Fluid:  > 2L/d    Loistine Chance, RD, LDN, [place] Registered Dietitian II Certified Diabetes Care and Education Specialist Please refer to [facility] for RD and/or RD on-call/weekend/after hours pager

## 2021-07-17 ENCOUNTER — Inpatient Hospital Stay (HOSPITAL_COMMUNITY): Payer: 59

## 2021-07-17 DIAGNOSIS — G959 Disease of spinal cord, unspecified: Secondary | ICD-10-CM | POA: Diagnosis not present

## 2021-07-17 DIAGNOSIS — G9341 Metabolic encephalopathy: Secondary | ICD-10-CM | POA: Diagnosis not present

## 2021-07-17 DIAGNOSIS — R739 Hyperglycemia, unspecified: Secondary | ICD-10-CM | POA: Diagnosis not present

## 2021-07-17 DIAGNOSIS — J690 Pneumonitis due to inhalation of food and vomit: Secondary | ICD-10-CM | POA: Diagnosis not present

## 2021-07-17 LAB — MAGNESIUM: Magnesium: 1.9 mg/dL (ref 1.7–2.4)

## 2021-07-17 LAB — POCT I-STAT 7, (LYTES, BLD GAS, ICA,H+H)
Acid-base deficit: 2 mmol/L (ref 0.0–2.0)
Bicarbonate: 23.2 mmol/L (ref 20.0–28.0)
Calcium, Ion: 1.32 mmol/L (ref 1.15–1.40)
HCT: 24 % — ABNORMAL LOW (ref 39.0–52.0)
Hemoglobin: 8.2 g/dL — ABNORMAL LOW (ref 13.0–17.0)
O2 Saturation: 95 %
Patient temperature: 97.5
Potassium: 3.2 mmol/L — ABNORMAL LOW (ref 3.5–5.1)
Sodium: 141 mmol/L (ref 135–145)
TCO2: 24 mmol/L (ref 22–32)
pCO2 arterial: 38.8 mmHg (ref 32–48)
pH, Arterial: 7.382 (ref 7.35–7.45)
pO2, Arterial: 76 mmHg — ABNORMAL LOW (ref 83–108)

## 2021-07-17 LAB — CULTURE, RESPIRATORY W GRAM STAIN: Culture: NORMAL

## 2021-07-17 LAB — TROPONIN I (HIGH SENSITIVITY): Troponin I (High Sensitivity): 37 ng/L — ABNORMAL HIGH (ref <18)

## 2021-07-17 LAB — GLUCOSE, CAPILLARY
Glucose-Capillary: 105 mg/dL — ABNORMAL HIGH (ref 70–99)
Glucose-Capillary: 112 mg/dL — ABNORMAL HIGH (ref 70–99)
Glucose-Capillary: 119 mg/dL — ABNORMAL HIGH (ref 70–99)
Glucose-Capillary: 148 mg/dL — ABNORMAL HIGH (ref 70–99)

## 2021-07-17 LAB — COMPREHENSIVE METABOLIC PANEL
ALT: 24 U/L (ref 0–44)
AST: 32 U/L (ref 15–41)
Albumin: 1.5 g/dL — ABNORMAL LOW (ref 3.5–5.0)
Alkaline Phosphatase: 113 U/L (ref 38–126)
Anion gap: 8 (ref 5–15)
BUN: 26 mg/dL — ABNORMAL HIGH (ref 6–20)
CO2: 21 mmol/L — ABNORMAL LOW (ref 22–32)
Calcium: 8.6 mg/dL — ABNORMAL LOW (ref 8.9–10.3)
Chloride: 107 mmol/L (ref 98–111)
Creatinine, Ser: 0.95 mg/dL (ref 0.61–1.24)
GFR, Estimated: >60 mL/min (ref >60)
Glucose, Bld: 136 mg/dL — ABNORMAL HIGH (ref 70–99)
Potassium: 3.6 mmol/L (ref 3.5–5.1)
Sodium: 136 mmol/L (ref 135–145)
Total Bilirubin: 1.3 mg/dL — ABNORMAL HIGH (ref 0.3–1.2)
Total Protein: 6.1 g/dL — ABNORMAL LOW (ref 6.5–8.1)

## 2021-07-17 LAB — CBC WITH DIFFERENTIAL/PLATELET
Abs Immature Granulocytes: 0.12 10*3/uL — ABNORMAL HIGH (ref 0.00–0.07)
Basophils Absolute: 0 10*3/uL (ref 0.0–0.1)
Basophils Relative: 0 %
Eosinophils Absolute: 0.4 10*3/uL (ref 0.0–0.5)
Eosinophils Relative: 2 %
HCT: 22.7 % — ABNORMAL LOW (ref 39.0–52.0)
Hemoglobin: 7.8 g/dL — ABNORMAL LOW (ref 13.0–17.0)
Immature Granulocytes: 1 %
Lymphocytes Relative: 6 %
Lymphs Abs: 1.3 10*3/uL (ref 0.7–4.0)
MCH: 33.5 pg (ref 26.0–34.0)
MCHC: 34.4 g/dL (ref 30.0–36.0)
MCV: 97.4 fL (ref 80.0–100.0)
Monocytes Absolute: 1.1 10*3/uL — ABNORMAL HIGH (ref 0.1–1.0)
Monocytes Relative: 5 %
Neutro Abs: 17.9 10*3/uL — ABNORMAL HIGH (ref 1.7–7.7)
Neutrophils Relative %: 86 %
Platelets: 374 10*3/uL (ref 150–400)
RBC: 2.33 MIL/uL — ABNORMAL LOW (ref 4.22–5.81)
RDW: 14.8 % (ref 11.5–15.5)
WBC: 20.9 10*3/uL — ABNORMAL HIGH (ref 4.0–10.5)
nRBC: 0 % (ref 0.0–0.2)

## 2021-07-17 LAB — BASIC METABOLIC PANEL
Anion gap: 9 (ref 5–15)
BUN: 24 mg/dL — ABNORMAL HIGH (ref 6–20)
CO2: 22 mmol/L (ref 22–32)
Calcium: 8.7 mg/dL — ABNORMAL LOW (ref 8.9–10.3)
Chloride: 108 mmol/L (ref 98–111)
Creatinine, Ser: 0.79 mg/dL (ref 0.61–1.24)
GFR, Estimated: >60 mL/min (ref >60)
Glucose, Bld: 127 mg/dL — ABNORMAL HIGH (ref 70–99)
Potassium: 3.4 mmol/L — ABNORMAL LOW (ref 3.5–5.1)
Sodium: 139 mmol/L (ref 135–145)

## 2021-07-17 LAB — LACTIC ACID, PLASMA: Lactic Acid, Venous: 1.4 mmol/L (ref 0.5–1.9)

## 2021-07-17 LAB — PROCALCITONIN: Procalcitonin: 25.09 ng/mL

## 2021-07-17 LAB — APTT: aPTT: 66 seconds — ABNORMAL HIGH (ref 24–36)

## 2021-07-17 LAB — BRAIN NATRIURETIC PEPTIDE: B Natriuretic Peptide: 115.4 pg/mL — ABNORMAL HIGH (ref 0.0–100.0)

## 2021-07-17 LAB — PHOSPHORUS: Phosphorus: 1.8 mg/dL — ABNORMAL LOW (ref 2.5–4.6)

## 2021-07-17 LAB — LEGIONELLA PNEUMOPHILA SEROGP 1 UR AG: L. pneumophila Serogp 1 Ur Ag: NEGATIVE

## 2021-07-17 IMAGING — DX DG CHEST 1V PORT
1 series · 1 of 1 positions shown · non-contrast
Comparison: [DATE]

CLINICAL DATA: Aspiration

EXAM:
PORTABLE CHEST 1 VIEW

[chest]
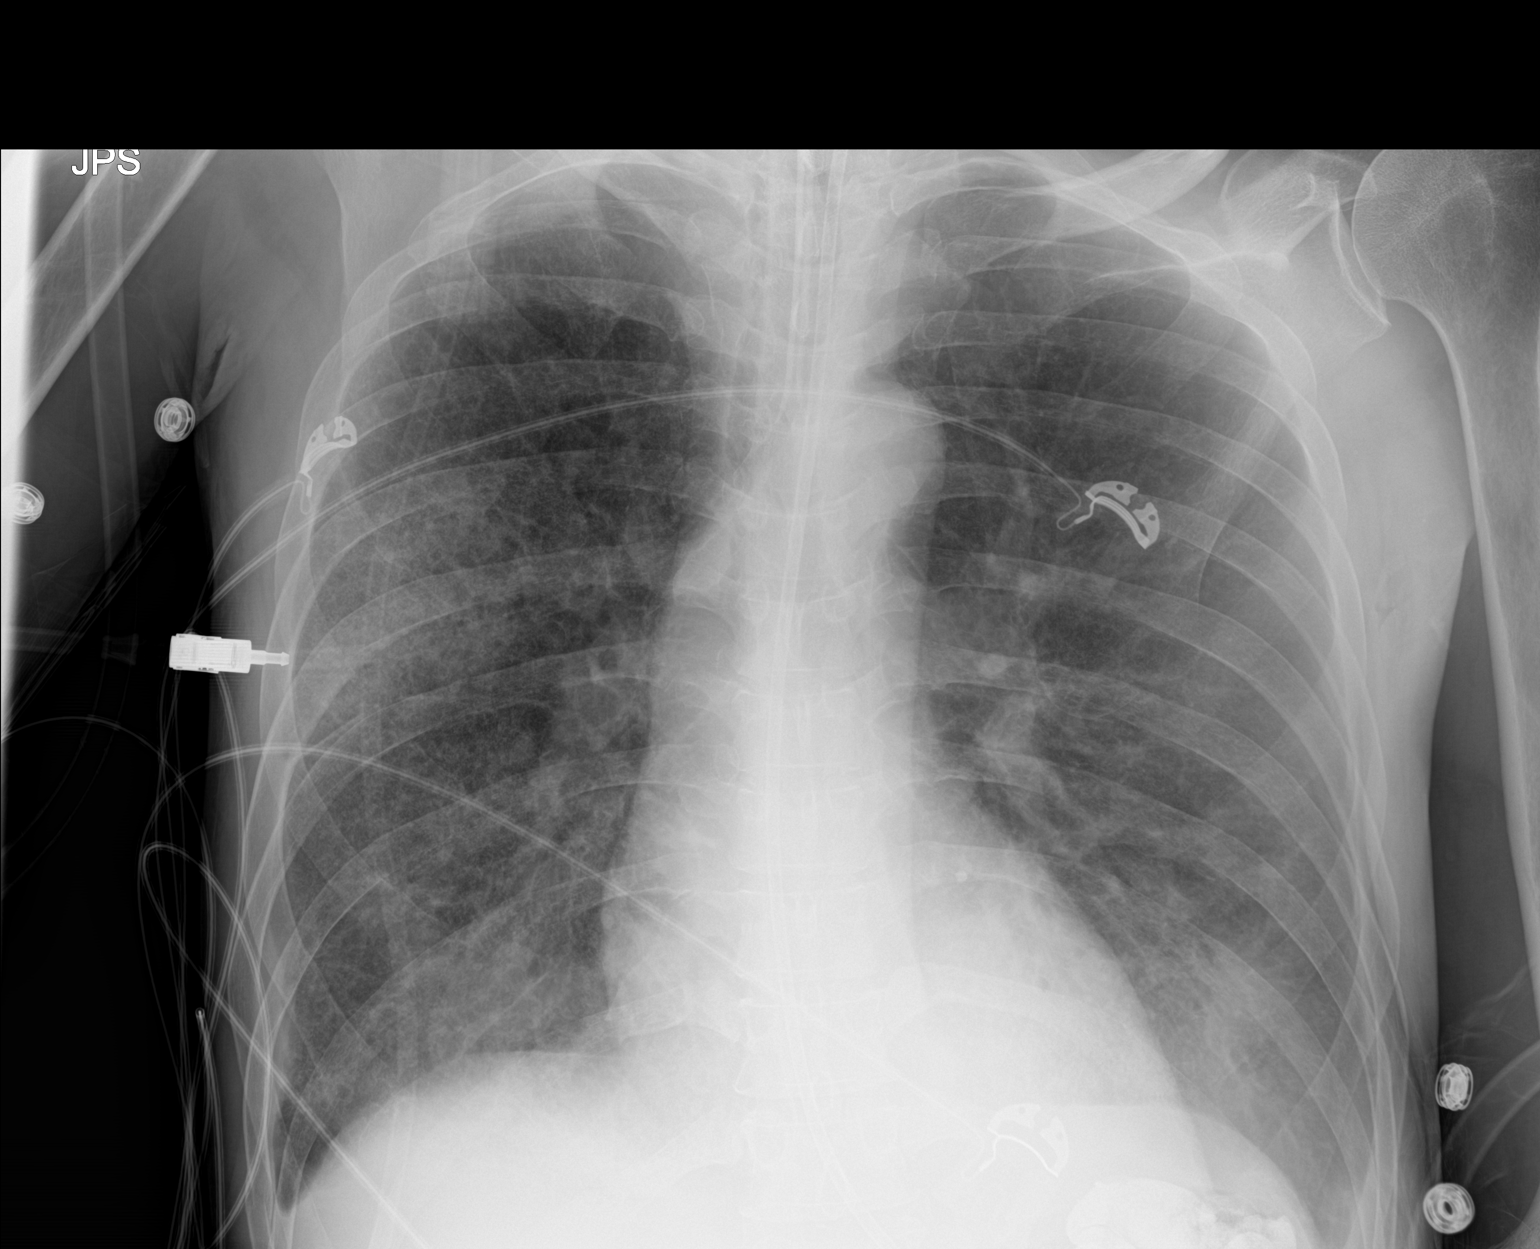

[1 of 1 positions shown; findings below may reference images not displayed]

FINDINGS: Endotracheal tube overlies the midthoracic trachea. Feeding tube
passes below the diaphragm, tip excluded by collimation. Unchanged
cardiomediastinal silhouette. Persistent bilateral airspace disease,
most confluent in the right upper lobe and bilateral lung bases.
There is slight improvement in the left upper lung. Stable small
pleural effusions. No visible pneumothorax. No acute osseous
abnormality. Unchanged partially visualized contrast material in the
left upper quadrant.
IMPRESSION: Persistent bilateral airspace disease, slightly improved in the left
upper lung. Stable small effusions.

## 2021-07-17 IMAGING — DX DG CHEST 1V PORT
1 series · 1 of 1 positions shown · non-contrast
Comparison: Chest x-ray [DATE] [DATE] a.m.

CLINICAL DATA: Acute respiratory failure with hypoxia

EXAM:
PORTABLE CHEST 1 VIEW

[chest ap]
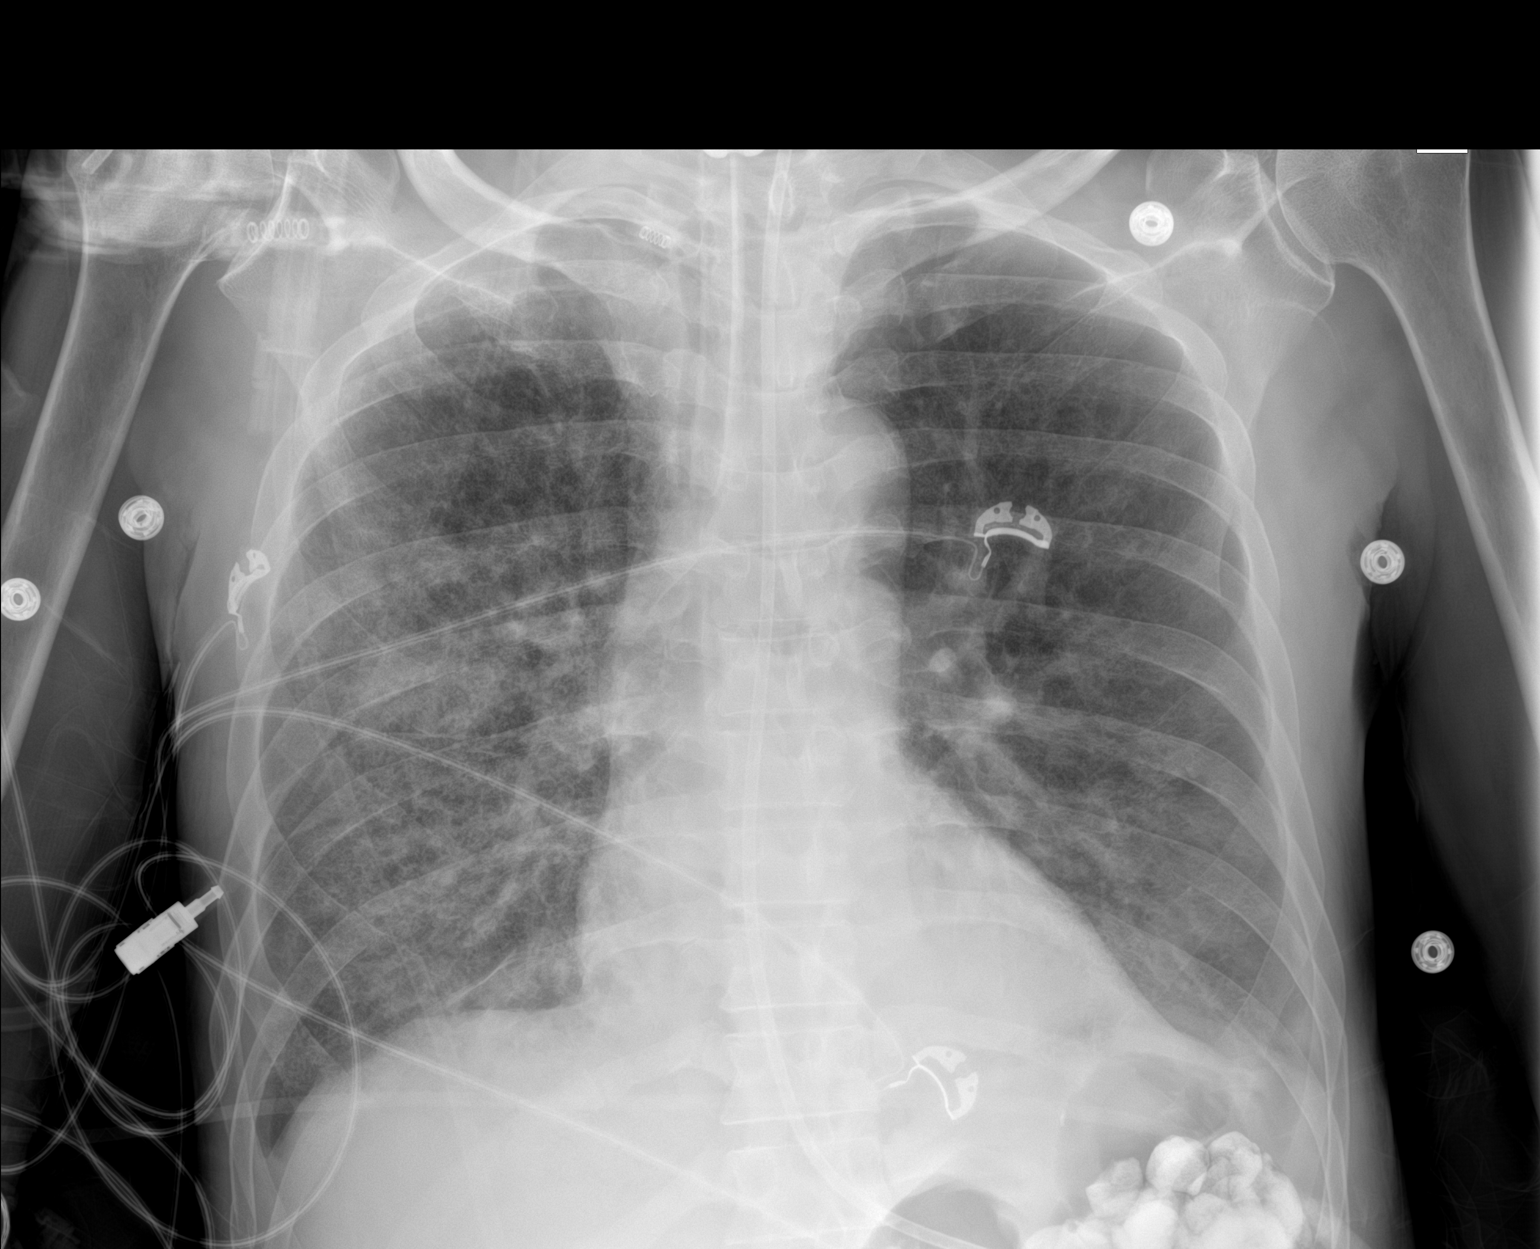

[1 of 1 positions shown; findings below may reference images not displayed]

FINDINGS: Endotracheal tube with tip terminating 4 cm above the carina.
Enteric tube coursing below the hemidiaphragm with tip collimated
off view.

The heart and mediastinal contours are unchanged. There is aortic
calcification.

Similar-appearing bilateral airspace opacities more confluent within
the right lung. No pulmonary edema. Nonspecific blunting of the
right costophrenic angle likely representing trace pleural effusion.
Persistent trace left pleural effusion. No pneumothorax.

No acute osseous abnormality.
IMPRESSION: 1. Lines and tubes in stable position with the tip of the enteric
tube collimated off view.
2. Similar-appearing bilateral airspace opacities.
3. Trace bilateral pleural effusions.

## 2021-07-17 MED ORDER — DIPHENHYDRAMINE HCL 12.5 MG/5ML PO ELIX
50.0000 mg | ORAL_SOLUTION | Freq: Once | ORAL | Status: AC
  Administered 2021-07-17: 50 mg
  Filled 2021-07-17 (×2): qty 20

## 2021-07-17 MED ORDER — POTASSIUM CHLORIDE 20 MEQ PO PACK
40.0000 meq | PACK | Freq: Once | ORAL | Status: AC
  Administered 2021-07-17: 40 meq
  Filled 2021-07-17 (×2): qty 2

## 2021-07-17 MED ORDER — QUETIAPINE FUMARATE 25 MG PO TABS
25.0000 mg | ORAL_TABLET | Freq: Two times a day (BID) | ORAL | Status: DC
  Administered 2021-07-17 (×2): 25 mg
  Filled 2021-07-17 (×2): qty 1

## 2021-07-17 MED ORDER — SODIUM CHLORIDE 3 % IN NEBU
4.0000 mL | INHALATION_SOLUTION | Freq: Four times a day (QID) | RESPIRATORY_TRACT | Status: DC
  Administered 2021-07-17 – 2021-07-19 (×4): 4 mL via RESPIRATORY_TRACT
  Filled 2021-07-17 (×5): qty 4

## 2021-07-17 MED ORDER — POTASSIUM PHOSPHATES 15 MMOLE/5ML IV SOLN
30.0000 mmol | Freq: Once | INTRAVENOUS | Status: AC
  Administered 2021-07-17: 30 mmol via INTRAVENOUS
  Filled 2021-07-17: qty 10

## 2021-07-17 MED ORDER — GUAIFENESIN 200 MG PO TABS
200.0000 mg | ORAL_TABLET | Freq: Four times a day (QID) | ORAL | Status: DC
  Filled 2021-07-17 (×3): qty 1

## 2021-07-17 MED ORDER — PIPERACILLIN-TAZOBACTAM 3.375 G IVPB
3.3750 g | Freq: Three times a day (TID) | INTRAVENOUS | Status: AC
  Administered 2021-07-17 – 2021-07-19 (×6): 3.375 g via INTRAVENOUS
  Filled 2021-07-17 (×6): qty 50

## 2021-07-17 MED ORDER — POTASSIUM CHLORIDE 10 MEQ/100ML IV SOLN
10.0000 meq | INTRAVENOUS | Status: AC
  Administered 2021-07-17 (×4): 10 meq via INTRAVENOUS
  Filled 2021-07-17 (×4): qty 100

## 2021-07-17 MED ORDER — DIPHENHYDRAMINE HCL 25 MG PO CAPS
25.0000 mg | ORAL_CAPSULE | Freq: Four times a day (QID) | ORAL | Status: DC | PRN
  Administered 2021-07-18 – 2021-08-28 (×4): 25 mg
  Filled 2021-07-17 (×4): qty 1

## 2021-07-17 MED ORDER — IPRATROPIUM-ALBUTEROL 0.5-2.5 (3) MG/3ML IN SOLN
3.0000 mL | RESPIRATORY_TRACT | Status: DC
Start: 2021-07-17 — End: 2021-07-19
  Administered 2021-07-17 – 2021-07-19 (×9): 3 mL via RESPIRATORY_TRACT
  Filled 2021-07-17 (×10): qty 3

## 2021-07-17 MED ORDER — QUETIAPINE FUMARATE 25 MG PO TABS: 50.0000 mg | ORAL_TABLET | Freq: Every day | ORAL | Status: DC

## 2021-07-17 MED ORDER — GUAIFENESIN 200 MG PO TABS
200.0000 mg | ORAL_TABLET | Freq: Four times a day (QID) | ORAL | Status: DC
  Administered 2021-07-17 – 2021-07-22 (×20): 200 mg
  Filled 2021-07-17 (×20): qty 1

## 2021-07-17 MED ORDER — IPRATROPIUM-ALBUTEROL 0.5-2.5 (3) MG/3ML IN SOLN: 3.0000 mL | Freq: Three times a day (TID) | RESPIRATORY_TRACT | Status: DC

## 2021-07-17 MED ORDER — MAGNESIUM SULFATE 2 GM/50ML IV SOLN
2.0000 g | Freq: Once | INTRAVENOUS | Status: AC
  Administered 2021-07-17: 2 g via INTRAVENOUS
  Filled 2021-07-17: qty 50

## 2021-07-17 MED ORDER — MIDAZOLAM HCL 2 MG/2ML IJ SOLN
2.0000 mg | INTRAMUSCULAR | Status: DC | PRN
  Administered 2021-07-17 – 2021-07-19 (×6): 2 mg via INTRAVENOUS
  Filled 2021-07-17 (×6): qty 2

## 2021-07-17 MED ORDER — MIDAZOLAM HCL 2 MG/2ML IJ SOLN
2.0000 mg | INTRAMUSCULAR | Status: DC | PRN
  Administered 2021-07-17 – 2021-07-19 (×2): 2 mg via INTRAVENOUS
  Filled 2021-07-17 (×3): qty 2

## 2021-07-17 NOTE — Progress Notes (Signed)
NAME:  Brian [name], MRN:  [id number], DOB:  19-Aug-1960, LOS: 110 ADMISSION DATE:  06/29/2021, CONSULTATION DATE:  07/03/21 REFERRING MD:  Reinaldo Meeker, NP, CHIEF COMPLAINT:  AMS   History of Present Illness:   61 year old male with prior history of tobacco abuse and ETOH use who recently was found to have critical multilevel cervical spinal stenosis C3-6 with spinal cord compression and spinal cord signal change after recent fall with progressive upper and lower extremity weakness and tremors.  He was admitted on 2/3 and underwent ACDF of C3-4, C4-5, C5-5, and C6-7.  Post-operatively he was progressing slowly with residual weakness in both hands but improving lower extremity strength and function.  He developed some difficulty swallowing on 2/5 and SLP ordered and placed on thickened liquids.   Overnight 2/6, patient with increased difficulty swallowing and was made NPO but also noted to have dysarthria and difficulty moving extremities with numbness.  SLP evaluated and found to be moderate aspiration risk.   Also progressively tachycardic, tachypneic, and now febrile.  Code stroke activated and taken for CT/ CTA head and neck and was given decadron 10mg  once.  NIHSS 18.  CTH was negative for acute findings, and CTA head neck did not reveal any LVO but noted significant prevertebral soft tissue swelling with foci of gas present at the ventral epidural space at C4-5, small collection not excluded.  On 2/7, patient with worsening confusion and concern for airway involvement, PCCM consulted for further evaluation.   Pertinent  Medical History  Tobacco abuse, cervical stenosis, ETOH use   Significant Hospital Events: Including procedures, antibiotic start and stop dates in addition to other pertinent events   2/3 ACDF C3-4, C4-5, C5-5, and C6-7 w/ Dr. Annette Stable 2/6 SLP eval for difficulty swallowing 2/7 Code stroke overnight, neg for LVO, CT showing soft tissue swelling.  PCCM consulted for concern of airway  management and AMS.  Went for evacuation of hematoma  2/18 CT Abd/Pelvis: showing bilateral segmental Pes, started on heparin and showing worsening pneumonia, febrile to  103.  PCT rising, 34.6, restarted on abx 2/19 possible aspiration w/ severe hypoxia; intubated; Switching from heparin to angiomax given subtherapeutic levels despite increase in rate. 2/20 Echo shows normal LV systolic function. There was notation of a + McConnell's sign c/w a large pulmonary embolus which is consistent with the finding of bilateral segmental pulmonary emboli noted on CT 07/14/2021.  Images  2/7 CT angio head and neck > No large vessel occlusion, hemodynamically significant stenosis, or evidence of dissection. Significant prevertebral soft tissue swelling. Foci of gas are present the ventral epidural space at C4-C5; a small collection is not excluded.  Interim History / Subjective:  Patient intubated on PRVC breathing in 30s Patient follows some commands; appears restless and mildly agitated: on Precedex 1.2 and fentanyl 75 Fever spike 102: cultures pending; Procalc, LA, wbc downtrending On antibiotics K/Phos low; being repleted  Objective   Blood pressure 95/69, pulse 93, temperature (!) 102.2 F (39 C), temperature source Axillary, resp. rate 16, height 5\' 10"  (1.778 m), weight 56.1 kg, SpO2 100 %.    Vent Mode: PRVC FiO2 (%):  [40 %] 40 % Set Rate:  [24 bmp] 24 bmp Vt Set:  [580 mL] 580 mL PEEP:  [8 cmH20] 8 cmH20 Plateau Pressure:  [22 cmH20-24 cmH20] 24 cmH20   Intake/Output Summary (Last 24 hours) at 07/17/2021 0706 Last data filed at 07/17/2021 0606 Gross per 24 hour  Intake 2708.86 ml  Output 2375  ml  Net 333.86 ml    Filed Weights   07/11/21 0500 07/12/21 0500 07/13/21 0337  Weight: 56.3 kg 56.3 kg 56.1 kg   Examination: General:  critically ill appearing on mech vent HEENT: MM pink/moist; ETT in place Neuro: on precedex/fentnayl; MAE; follows some commands (sticks tongue out, wiggles  toes b/l) CV: s1s2, no m/r/g PULM:  dim clear BS bilaterally; on mech vent PRVC GI: soft, bsx4 active  Extremities: warm/dry, no edema  Skin: no rashes or lesions appreciated  Labs/imaging Phosph 1.8, K 3.6: both repleted overnight Mag 1.9 Troponin downtrending: 37 (from 55) BNP downtrending 115 (from 1,177) Procalcitonin 25.09 (from 34.62) Lactic acid 1.4 WBC 20.9 (from 25.9) Hgb 7.8 (from 8.3)  Cxr 2/21: appears unchanged from yesterday w/ multilobar pneumonia; ETT in good position  Echo 2/20: LVEF 55-60%; D shaped L ventricle (likely volume overload); + McConnell's sign; Severely reduced systolic R function; Moderately enlarged R ventricle  Resolved Hospital Problem list    Assessment & Plan:   Acute hypoxic respiratory failure secondary to recurrent aspiration pneumonia Dysphagia  P: -continue mech vent PRVC 6-8 cc/kg -wean fio2 for sats >92% -VAP prevention in place -daily SBT/SAT -sedation for RASS 0 to -1 -continue cefepime/vanc -follow trach culture, mrsa pcr, urine legionella  -continue cortrack -unable to extubate today due to tachypnea and desaturation -will need to consider trach/peg if unable to extubate  Bilateral segmental PE (new 2/18):  - changing heparin to angiomax given sub-therapeutic levels despite increases (also, heparin started no bolus on 2/18 given recent hematoma evac on 2/7).  HFpEF: Echo 2/20 LVEF 55-60%; D shaped L ventricle (likely volume overload); + McConnell's sign; Severely reduced systolic R function; Moderately enlarged R ventricle Tachycardia with rare PVC P:  -continuous telemetry monitoring - continue angiomax -daily weights/strict I/O's -trop/bnp downtrending -repleting K/Phosh this am -trend bmp/mag/phosph and replete as needed  Sepsis secondary to aspiration pneumonia  -Intermittent Hypotension overnight with tachycardia into 130's  -Urine Strep antigen negative -trach culture, bcx2, mrsa pcr, Legionella pending P: -  Trend fever and WBC - follow cultures - Trend PCT - continue cefepime/vanc  Severe cervical spinal stenosis with spinal cord compression s/p ACDF C3-C7 on 2/3 c/b epidural hematoma s/p evacuation 2/7 Exam drastically improved s/p evacuation.  P: -NSGY following; appreciate recs  -continue c-collar  Agitation  ETOH abuse Drug Abuse Hx Daily tobacco use  P: -continue precedex/fentanyl for RASS 0 to -1 -cont seroquel -cont thiamine, folate, mvi -will need tobacco cessation counseling  Hyponatremia -Na 135 on 2/20 P: -trend BMP  Constipation vs fecal impaction P: -cont senna, dulcolax and miralax -cont TF through cortrack  Normocytic anemia Drop in HGB overnight from  9.9 to 7.3 on anticoagulation  No obvious signs of bleeding P: -trend cbc -transfuse per protocol for hgb <7  Protein calorie malnutrition P: - TF per dietician -cont cor trak  Elevated Triglycerides P: -Propofol stopped 2/20  Best Practice (right click and "Reselect all SmartList Selections" daily)   Diet/type: tubefeeds DVT prophylaxis: other angiomax GI prophylaxis: PPI Lines: N/A Foley:  Yes, and it is still needed Code Status:  full code Last date of multidisciplinary goals of care discussion:   2/21 pending  Critical care time: 35 mins   JD Rexene Agent  Pulmonary & Critical Care 07/17/2021, 7:26 AM  Please see [email or web address] for pager details.  From 7A-7P if no response, please call [phone]. After hours, please call ELink [phone].

## 2021-07-17 NOTE — Progress Notes (Addendum)
eLink Physician-Brief Progress Note Patient Name: Brian [name] DOB: [birth year] MRN: [id number]   Date of Service  07/17/2021  HPI/Events of Note  Hypophosphatemia   Hypokalemia  - PO4--- = 1.8, K+ = 3.6 and Creatinine = 0.95. Ca++ = 8.6 which corrects to 10.6 (normal) given albumin = 1.5.  eICU Interventions  Will replace K+ and phosphorus.     Intervention Category Major Interventions: Electrolyte abnormality - evaluation and management  Dmonte Maher Eugene 07/17/2021, 4:04 AM

## 2021-07-17 NOTE — Progress Notes (Signed)
PT Cancellation Note  Patient Details Name: Brian [name] MRN: [id number] DOB: February 11, 1961   Cancelled Treatment:    Reason Eval/Treat Not Completed: Medical issues which prohibited therapy (pt now intubated). Pt has had a decline in status, please reorder therapies when pt medically ready to tolerate again.  Thanks!  Lyanne Co, PT  Acute Rehab Services  Pager [phone] Office [phone]    Lawana Chambers Vernal Hritz 07/17/2021, 12:14 PM

## 2021-07-17 NOTE — Progress Notes (Signed)
Pharmacy Antibiotic Note  Brian [name] is a 61 y.o. male admitted on 06/29/2021 s/p ACDF and then exploration cervical fusion and evac epidural hematoma.  Intubated 2/19 and transferred to ICU on cefepime/vancomycin, now with rash and to change to zosyn.    Plan: Zosyn 3.375g IV q 8h (extended 4h infusion) Monitor renal function, tracheal aspirate Cx to narrow  Height: 5\' 10"  (177.8 cm) Weight: 56.1 kg (123 lb 10.9 oz) IBW/kg (Calculated) : 73  Temp (24hrs), Avg:99.2 F (37.3 C), Min:97.2 F (36.2 C), Max:102.2 F (39 C)  Recent Labs  Lab 07/13/21 1747 07/14/21 0542 07/14/21 0928 07/15/21 0335 07/15/21 0816 07/16/21 0718 07/16/21 1012 07/16/21 1548 07/17/21 0147  WBC 18.2* 23.4*  --  27.7*  --  25.8*  --  25.9* 20.9*  CREATININE 0.99 1.10  --  0.96  --  0.94  --   --  0.95  LATICACIDVEN  --  1.1 1.8  --  2.0*  --  1.3  --  1.4    Estimated Creatinine Clearance: 65.6 mL/min (by C-G formula based on SCr of 0.95 mg/dL).    No Known Allergies  Zosyn 2/21>> Vancomycin 2/7>>2/8, 2/18>>2/21 Cefepime 2/7>>2/8, 2/18>>2/21 Metronidazole 2/7>>2/8 Unasyn 2/8>>2/11   2/7 BCx: neg 2/7 UCx: neg 2/7 MRSA PCR: negative 2/18 BCx: ngtd 2/18 UCx: no growth 2/19 TA: Reincubated - no organisms seen 2/19 MRSA PCR: pending   Bertis Ruddy, PharmD Clinical Pharmacist ED Pharmacist Phone # [phone] 07/17/2021 12:05 PM

## 2021-07-17 NOTE — Progress Notes (Signed)
Pt's son [name] updated on pt's plan of care by RN and APP.

## 2021-07-17 NOTE — Progress Notes (Signed)
ANTICOAGULATION CONSULT NOTE  Pharmacy Consult for bivalirudin Indication: pulmonary embolus  No Known Allergies  Patient Measurements: Height: 5\' 10"  (177.8 cm) Weight: 56.1 kg (123 lb 10.9 oz) IBW/kg (Calculated) : 73 Heparin Dosing Weight: TBW  Vital Signs: Temp: 102.2 F (39 C) (02/21 0400) Temp Source: Axillary (02/21 0400) BP: 95/69 (02/21 0600) Pulse Rate: 93 (02/21 0600)  Labs: Recent Labs     0000 07/14/21 1929 07/15/21 0335 07/15/21 1237 07/15/21 1549 07/15/21 1921 07/16/21 0718 07/16/21 1012 07/16/21 1548 07/17/21 0147  HGB  --   --  9.2*  --    < >  --  7.3*  --  8.3* 7.8*  HCT  --   --  27.4*  --    < >  --  21.2*  --  23.9* 22.7*  PLT   < >  --  373  --   --   --  384  --  389 374  APTT  --   --   --   --   --  81* 71*  --   --  66*  HEPARINUNFRC  --  <0.10* <0.10* <0.10*  --   --   --   --   --   --   CREATININE  --   --  0.96  --   --   --  0.94  --   --  0.95  CKTOTAL  --   --  78  --   --   --   --   --   --   --   TROPONINIHS  --   --   --   --    < > 98*  --  55*  --  37*   < > = values in this interval not displayed.     Estimated Creatinine Clearance: 65.6 mL/min (by C-G formula based on SCr of 0.95 mg/dL).   Medical History: History reviewed. No pertinent past medical history.   Assessment: 87 yom s/p ACDF then exploration and evacuation of epidural hematoma. He is on antibiotics for PNA. He has on heparin but heparin levels were undetectable  x3 and he was changed to bivalirudin.  This AM aPTT is therapeutic at 66 seconds on 0.15 mg/kg/min  Goal of Therapy:  Goal aPTT 55-80 Monitor platelets by anticoagulation protocol: Yes   Plan:  Continue bivalirudin at 0.15 mg/kg/min Daily aPTT, CBC, s/s bleeding  Bertis Ruddy, PharmD Clinical Pharmacist ED Pharmacist Phone # [phone] 07/17/2021 7:44 AM a

## 2021-07-17 NOTE — Progress Notes (Signed)
Difficulty with Peak pressure alarming on vent. RT and provider notified, all at bedside.

## 2021-07-17 NOTE — Consult Note (Signed)
No new issues or problems overnight.  Patient remains intubated and sedated.  Will awaken with stimulation.  Appears aware but then drifts back off to sleep secondary to the sedation.  Still moving all extremities better than antigravity.  Wound clean and dry.  Neck soft.  Airway midline.  Fevers trending down.  Mildly hypotensive.  Urine output remains good.  White blood cell count trending down.  Patient developing worsening anemia which I think is multifactorial.  He remains anticoagulated.  Chest x-ray this morning stable with bilateral disease worrisome for pneumonia.  Patient with significant respiratory failure requiring mechanical ventilation and sedation.  Continue IV antibiotics and anticoagulation for treatment of his hospital-acquired pneumonia and pulmonary embolus respectively.  No new orders from my standpoint, however I would not hesitate to transfuse him, if the hypotension persists.Marland Kitchen

## 2021-07-17 NOTE — Progress Notes (Signed)
Issue w/ peak pressures has resolved. CXR, ABG WNL. Pt looks comfortable.

## 2021-07-17 NOTE — Progress Notes (Signed)
Nurse called PCCM to bedside to evaluate erythematous rash on back and starting to reach into the chest. Patient started on cefepime/vanc yesterday. Concern for drug reaction.  -will stop vanc/cefepime -start zosyn for aspiration ppx -follow cultures -benadryl given  JD Anselm Lis [place] 07/17/2021, 12:06 PM  Please see [email or web address] for pager details.  From 7A-7P if no response, please call [phone]. After hours, please call ELink [phone].

## 2021-07-18 ENCOUNTER — Inpatient Hospital Stay (HOSPITAL_COMMUNITY): Payer: 59

## 2021-07-18 DIAGNOSIS — G9341 Metabolic encephalopathy: Secondary | ICD-10-CM | POA: Diagnosis not present

## 2021-07-18 DIAGNOSIS — J690 Pneumonitis due to inhalation of food and vomit: Secondary | ICD-10-CM | POA: Diagnosis not present

## 2021-07-18 DIAGNOSIS — G959 Disease of spinal cord, unspecified: Secondary | ICD-10-CM | POA: Diagnosis not present

## 2021-07-18 DIAGNOSIS — R739 Hyperglycemia, unspecified: Secondary | ICD-10-CM | POA: Diagnosis not present

## 2021-07-18 DIAGNOSIS — R140 Abdominal distension (gaseous): Secondary | ICD-10-CM | POA: Insufficient documentation

## 2021-07-18 LAB — CBC
HCT: 22.7 % — ABNORMAL LOW (ref 39.0–52.0)
Hemoglobin: 7.7 g/dL — ABNORMAL LOW (ref 13.0–17.0)
MCH: 33.6 pg (ref 26.0–34.0)
MCHC: 33.9 g/dL (ref 30.0–36.0)
MCV: 99.1 fL (ref 80.0–100.0)
Platelets: 365 10*3/uL (ref 150–400)
RBC: 2.29 MIL/uL — ABNORMAL LOW (ref 4.22–5.81)
RDW: 15.3 % (ref 11.5–15.5)
WBC: 17.8 10*3/uL — ABNORMAL HIGH (ref 4.0–10.5)
nRBC: 0 % (ref 0.0–0.2)

## 2021-07-18 LAB — BASIC METABOLIC PANEL
Anion gap: 7 (ref 5–15)
BUN: 22 mg/dL — ABNORMAL HIGH (ref 6–20)
CO2: 23 mmol/L (ref 22–32)
Calcium: 8.6 mg/dL — ABNORMAL LOW (ref 8.9–10.3)
Chloride: 110 mmol/L (ref 98–111)
Creatinine, Ser: 0.76 mg/dL (ref 0.61–1.24)
GFR, Estimated: >60 mL/min (ref >60)
Glucose, Bld: 117 mg/dL — ABNORMAL HIGH (ref 70–99)
Potassium: 3.9 mmol/L (ref 3.5–5.1)
Sodium: 140 mmol/L (ref 135–145)

## 2021-07-18 LAB — APTT: aPTT: 67 seconds — ABNORMAL HIGH (ref 24–36)

## 2021-07-18 LAB — GLUCOSE, CAPILLARY
Glucose-Capillary: 100 mg/dL — ABNORMAL HIGH (ref 70–99)
Glucose-Capillary: 103 mg/dL — ABNORMAL HIGH (ref 70–99)
Glucose-Capillary: 115 mg/dL — ABNORMAL HIGH (ref 70–99)
Glucose-Capillary: 89 mg/dL (ref 70–99)
Glucose-Capillary: 95 mg/dL (ref 70–99)
Glucose-Capillary: 95 mg/dL (ref 70–99)

## 2021-07-18 LAB — RENAL FUNCTION PANEL
Albumin: <1.5 g/dL — ABNORMAL LOW (ref 3.5–5.0)
Anion gap: 8 (ref 5–15)
BUN: 22 mg/dL — ABNORMAL HIGH (ref 6–20)
CO2: 23 mmol/L (ref 22–32)
Calcium: 8.6 mg/dL — ABNORMAL LOW (ref 8.9–10.3)
Chloride: 110 mmol/L (ref 98–111)
Creatinine, Ser: 0.77 mg/dL (ref 0.61–1.24)
GFR, Estimated: >60 mL/min (ref >60)
Glucose, Bld: 115 mg/dL — ABNORMAL HIGH (ref 70–99)
Phosphorus: 3.5 mg/dL (ref 2.5–4.6)
Potassium: 3.9 mmol/L (ref 3.5–5.1)
Sodium: 141 mmol/L (ref 135–145)

## 2021-07-18 LAB — PROCALCITONIN: Procalcitonin: 9.19 ng/mL

## 2021-07-18 LAB — MAGNESIUM: Magnesium: 1.9 mg/dL (ref 1.7–2.4)

## 2021-07-18 IMAGING — DX DG ABD PORTABLE 1V
1 series · 1 of 1 positions shown · non-contrast
Comparison: [DATE], [DATE]

CLINICAL DATA: Abdominal distension

EXAM:
PORTABLE ABDOMEN - 1 VIEW

[abdomen]
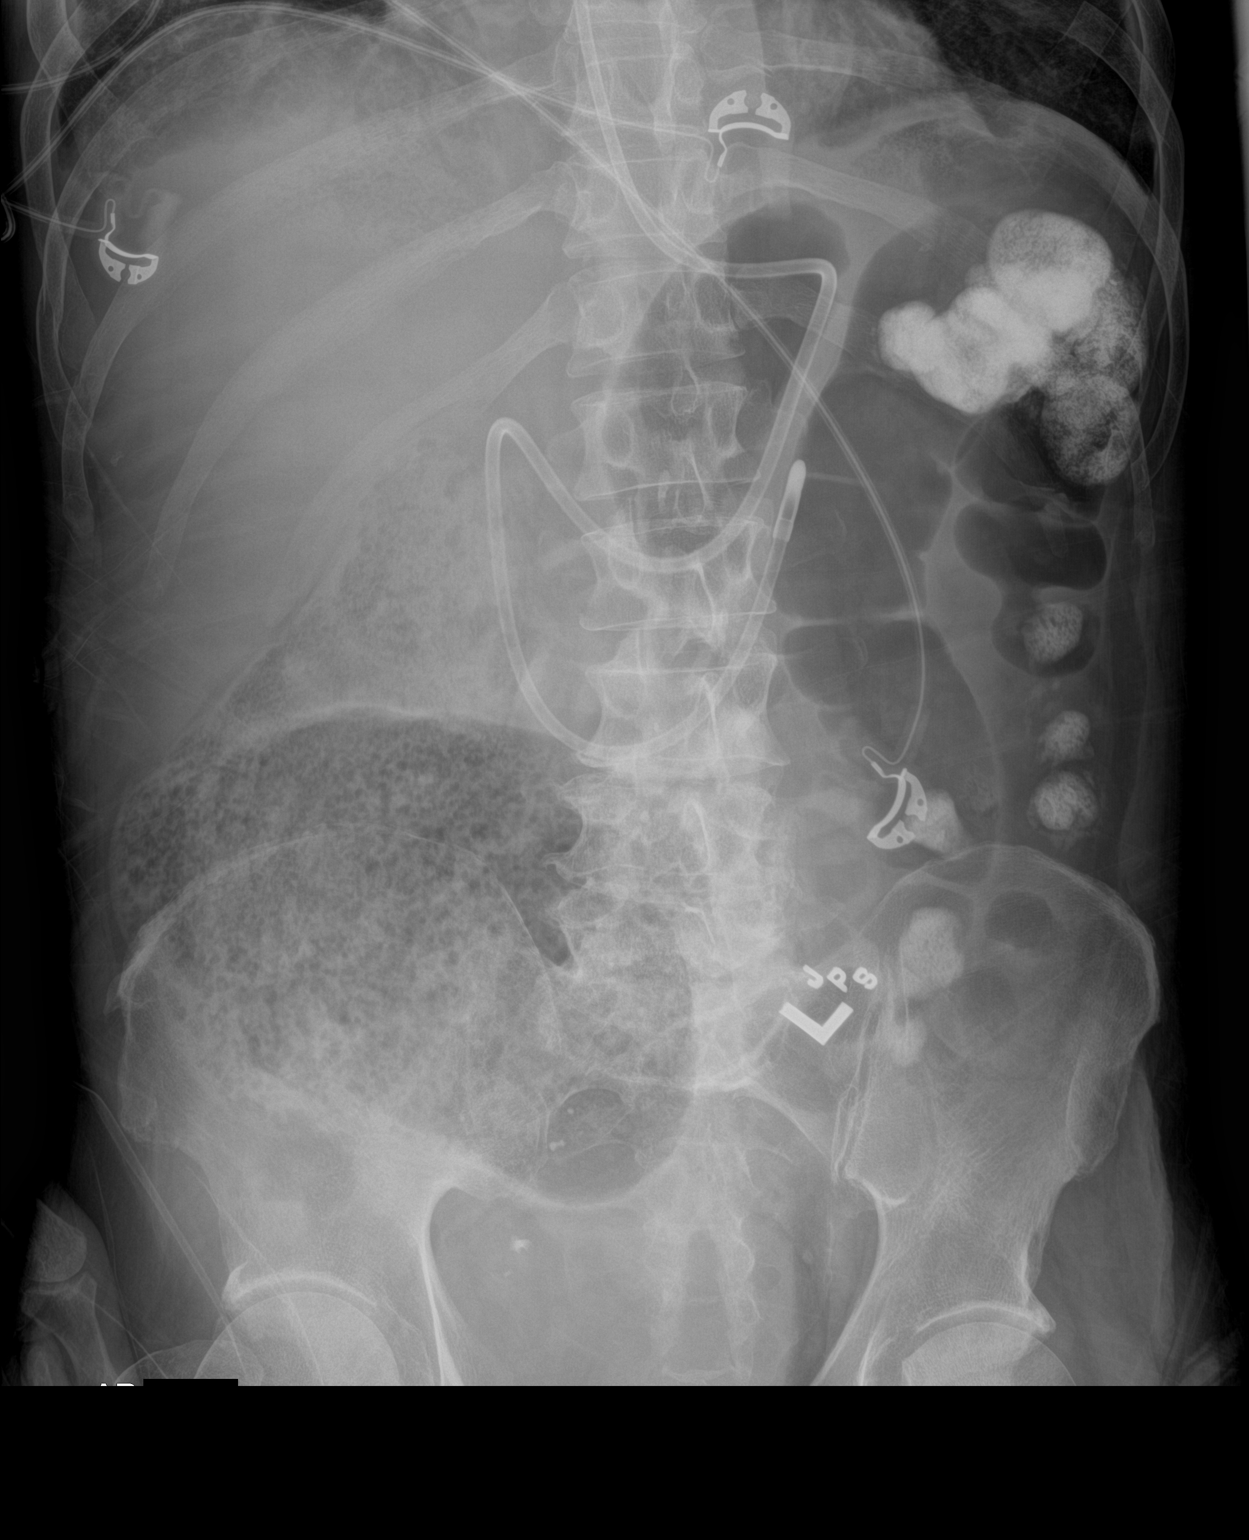

[1 of 1 positions shown; findings below may reference images not displayed]

FINDINGS: Enteric tube remains positioned within the stomach. Progressive
distension of the cecum with large amount of fecal material. Gaseous
distension of the transverse colon. No abnormally dilated loops of
small bowel. No gross free intraperitoneal air on AP portable supine
view.
IMPRESSION: 1. Progressive distension of the cecum with large amount of fecal
material. Gaseous distension of the transverse colon. No abnormally
dilated small bowel.
2. Enteric tube remains within the stomach.

## 2021-07-18 IMAGING — DX DG CHEST 1V PORT
1 series · 1 of 1 positions shown · non-contrast
Comparison: [DATE]

CLINICAL DATA: 60-year-old male with acute respiratory failure,
evaluate endotracheal tube placement.

EXAM:
PORTABLE CHEST - 1 VIEW

[chest]
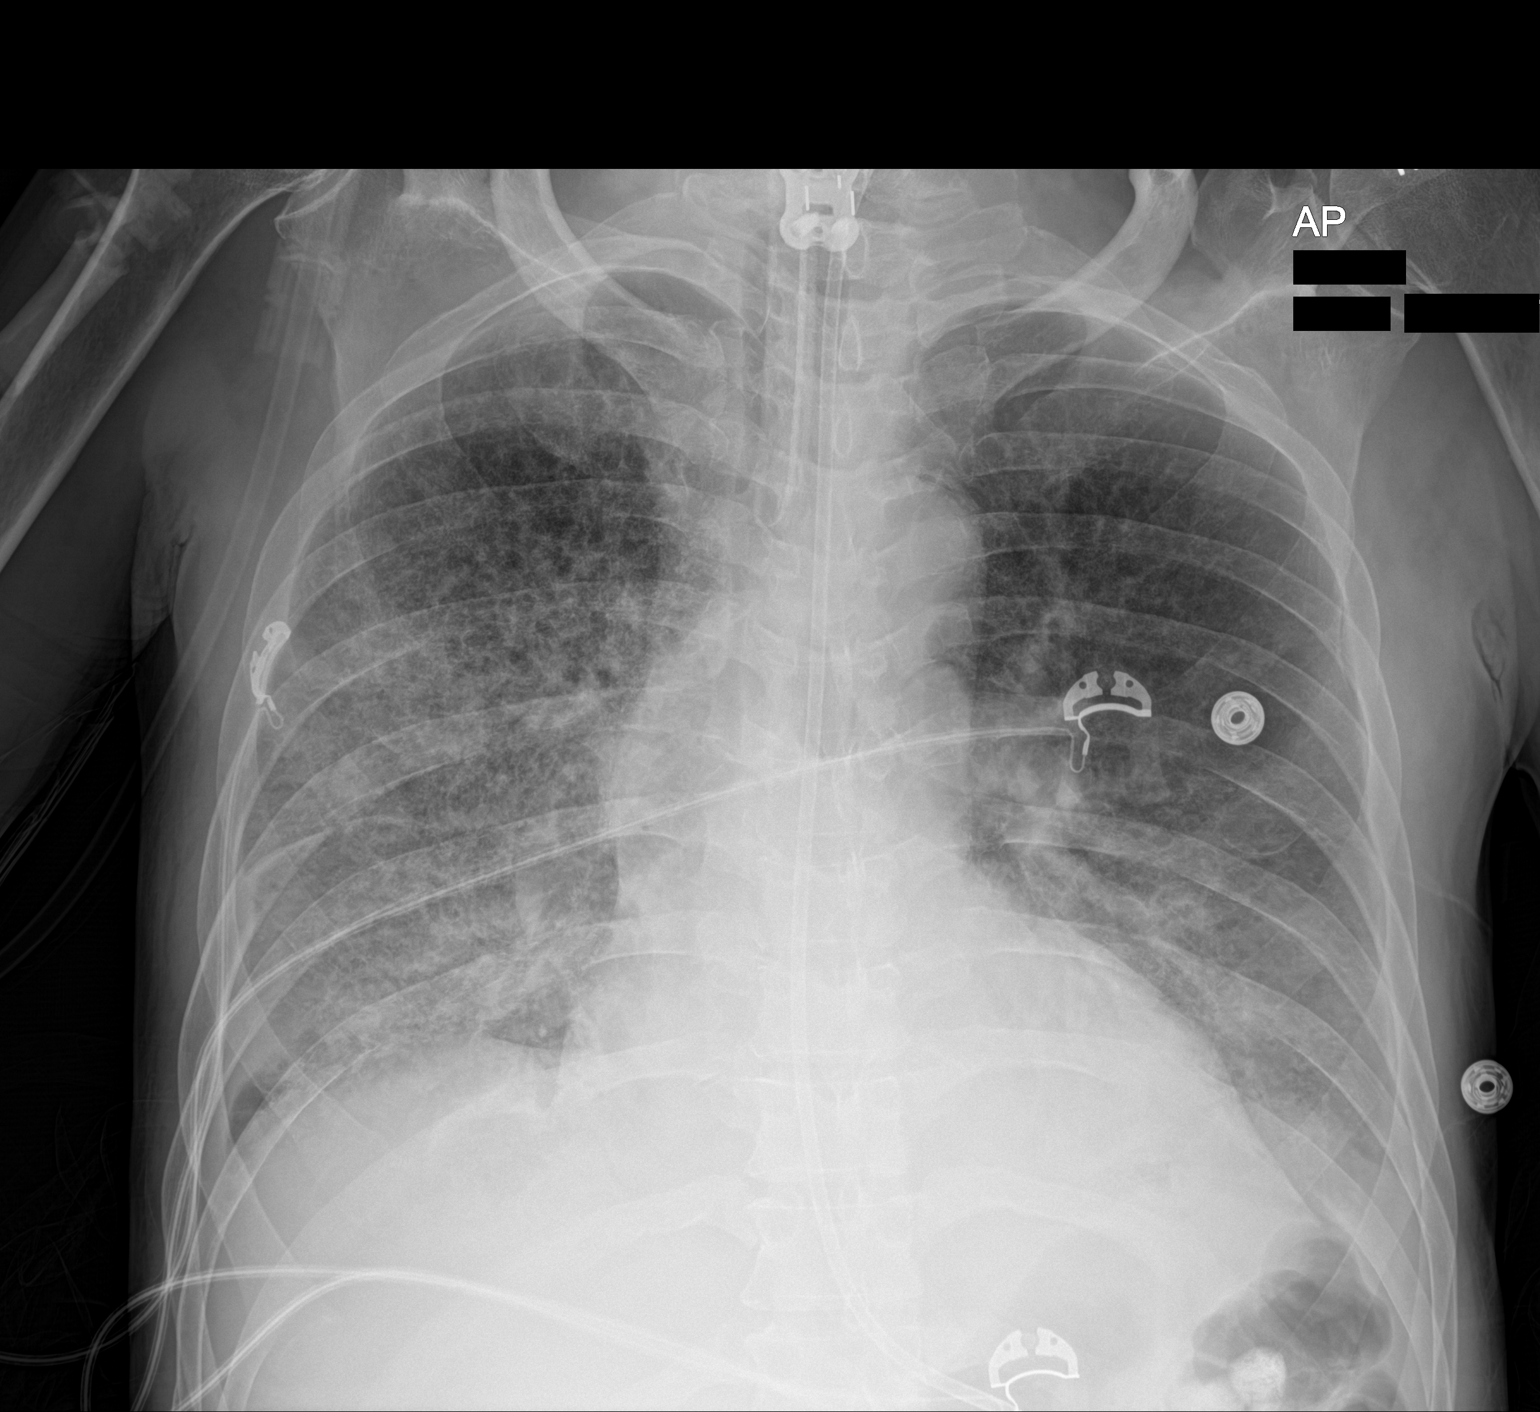

[1 of 1 positions shown; findings below may reference images not displayed]

FINDINGS: The mediastinal contours are within normal limits. No cardiomegaly.
Minimal interval retraction of the endotracheal tube with its tip in
the midthoracic trachea. Enteric tube terminates off the inferior
aspect of this image, unchanged. Interval increased hazy and
reticular opacities throughout the right lung, most prominent about
the base. Interval development of mild hazy opacities in the left
lower lobe and lingula. Trace bilateral pleural effusions. No
pneumothorax. No acute osseous abnormality. Similar appearance of
partially visualized cervical fusion hardware.
IMPRESSION: 1. Interval worsening of bilateral, right greater than left, hazy
and reticular opacities with associated development of trace
bilateral pleural effusions indicative of worsening pulmonary edema
versus multifocal pneumonia.
2. Slight interval retraction of endotracheal tube which remains
within the midthoracic trachea.

## 2021-07-18 MED ORDER — VITAL 1.5 CAL PO LIQD
1000.0000 mL | ORAL | Status: DC
  Administered 2021-07-19 – 2021-08-06 (×12): 1000 mL
  Filled 2021-07-18 (×18): qty 1000

## 2021-07-18 MED ORDER — METOCLOPRAMIDE HCL 5 MG/ML IJ SOLN
10.0000 mg | Freq: Three times a day (TID) | INTRAMUSCULAR | Status: DC
  Administered 2021-07-18 – 2021-07-22 (×13): 10 mg via INTRAVENOUS
  Filled 2021-07-18 (×13): qty 2

## 2021-07-18 MED ORDER — CLONAZEPAM 0.25 MG PO TBDP
2.0000 mg | ORAL_TABLET | Freq: Two times a day (BID) | ORAL | Status: DC
  Administered 2021-07-18 – 2021-07-22 (×9): 2 mg
  Filled 2021-07-18 (×10): qty 8

## 2021-07-18 MED ORDER — MORPHINE SULFATE 15 MG PO TABS
30.0000 mg | ORAL_TABLET | Freq: Four times a day (QID) | ORAL | Status: DC
  Administered 2021-07-18 – 2021-07-22 (×15): 30 mg
  Filled 2021-07-18 (×7): qty 2
  Filled 2021-07-18: qty 1
  Filled 2021-07-18 (×8): qty 2

## 2021-07-18 MED ORDER — FOLIC ACID 1 MG PO TABS
1.0000 mg | ORAL_TABLET | Freq: Every day | ORAL | Status: DC
  Administered 2021-07-18 – 2021-09-22 (×64): 1 mg
  Filled 2021-07-18 (×67): qty 1

## 2021-07-18 MED ORDER — PEG 3350-KCL-NA BICARB-NACL 420 G PO SOLR
4000.0000 mL | Freq: Once | ORAL | Status: AC
  Administered 2021-07-18: 4000 mL
  Filled 2021-07-18: qty 4000

## 2021-07-18 MED ORDER — LACTULOSE 10 GM/15ML PO SOLN
30.0000 g | Freq: Once | ORAL | Status: DC
  Filled 2021-07-18: qty 45

## 2021-07-18 MED ORDER — MORPHINE SULFATE 30 MG PO TABS: 30.0000 mg | ORAL_TABLET | Freq: Four times a day (QID) | ORAL | Status: DC

## 2021-07-18 MED ORDER — THIAMINE HCL 100 MG/ML IJ SOLN
100.0000 mg | Freq: Every day | INTRAMUSCULAR | Status: DC
  Administered 2021-07-18 – 2021-07-22 (×5): 100 mg via INTRAVENOUS
  Filled 2021-07-18 (×5): qty 2

## 2021-07-18 MED ORDER — ADULT MULTIVITAMIN W/MINERALS CH
1.0000 | ORAL_TABLET | Freq: Every day | ORAL | Status: DC
  Administered 2021-07-18 – 2021-07-29 (×12): 1
  Filled 2021-07-18 (×12): qty 1

## 2021-07-18 MED ORDER — LACTULOSE 10 GM/15ML PO SOLN
30.0000 g | Freq: Once | ORAL | Status: AC
  Administered 2021-07-18: 30 g

## 2021-07-18 MED ORDER — QUETIAPINE FUMARATE 25 MG PO TABS
50.0000 mg | ORAL_TABLET | Freq: Two times a day (BID) | ORAL | Status: DC
Start: 2021-07-18 — End: 2021-07-19
  Administered 2021-07-18 – 2021-07-19 (×3): 50 mg
  Filled 2021-07-18 (×3): qty 2

## 2021-07-18 MED ORDER — PROSOURCE TF PO LIQD
45.0000 mL | Freq: Three times a day (TID) | ORAL | Status: DC
  Administered 2021-07-18 – 2021-08-16 (×85): 45 mL
  Filled 2021-07-18 (×86): qty 45

## 2021-07-18 NOTE — Progress Notes (Signed)
Patient with XXL loose BM since start of Go-lytely. CCM aware, continue prep at 300cc/hr via cortrak. Rectal pouch placed.

## 2021-07-18 NOTE — Progress Notes (Signed)
Nutrition Follow-up  DOCUMENTATION CODES:   Severe malnutrition in context of social or environmental circumstances  INTERVENTION:   Once ready to resume TF Initiate tube feeding via post pyloric cortrak tube: Vital 1.5 at 50 ml/h (1200 ml per day) Prosource TF 45 ml TID  Provides 1920 kcal, 114 gm protein, 912 ml free water daily   NUTRITION DIAGNOSIS:   Severe Malnutrition related to social / environmental circumstances as evidenced by severe muscle depletion, severe fat depletion. Ongoing.   GOAL:   Patient will meet greater than or equal to 90% of their needs Progressing  MONITOR:   TF tolerance  REASON FOR ASSESSMENT:   Consult Assessment of nutrition requirement/status, Enteral/tube feeding initiation and management  ASSESSMENT:   Pt with hx EtOH abuse, tobacco use, and spinal stenosis initially presented 2/3 for planned multilevel anterior cervical decompression and fusion surgery after experiencing progressive bilateral upper and lower extremity weakness and spasticity due to critical multilevel cervical spinal stenosis with spinal cord compression and signal change.  Pt discussed during ICU rounds and with RN.  Pt admitted for planned ACDF then became increasing paralyzed due to epidural hematoma. Plan for rehab but then aspirated and developed PEs and tx to ICU on vent.   No BM yet KUB shows large fecal burden in the sigmoid and transverse colon with small fecaliths throughout the descending colon.  There is no stool in the rectum. Pt with severe obstipation plan for golytely per tube today due to lack of results with other meds/suppositories.   2/3 - Op, anterior cervical discectomy with interbody fusion  2/5 - pt initially complained of worsening swallowing function 2/6 - MBS, NPO after procedure per SLP 2/7 - code stroke activated (negative) but transferred to ICU 2/7 - Op, Reexploration anterior cervical fusion with evacuation of epidural hematoma 2/8  - Cortrak tube placed  02/10 - MBS, diet advanced to dysphagia 2 with nectar-thick liquids, Cortrak removed 02/11 - NPO 02/13 - diet advanced to dysphagia 2 with nectar-thick liquids 02/16 - diet advanced to dysphagia 2 with thin liquids 02/19- intubated after desaturation, possibly from PO intake 2/20 s/p cortrak tube; post pyloric    Patient is currently intubated on ventilator support  Temp (24hrs), Avg:99.9 F (37.7 C), Min:97.8 F (36.6 C), Max:102.6 F (39.2 C)  Medications reviewed and include: colace, folic acid, reglan, MVI with minerals, protonix, senokot-s, thiamine  Angiomax Precedex Fentanyl  Levoped @ 3 mcg  Labs reviewed: TG: 710 CBG's: 100-115  Current TF: Vital High Protein @ 40 ml/hr 45 ml ProSource TF BID Provides: 1040 kcal and 105 grams protein   Diet Order:   Diet Order             Diet NPO time specified  Diet effective now                   EDUCATION NEEDS:   Education needs have been addressed  Skin:  Skin Assessment: Skin Integrity Issues: Skin Integrity Issues:: Incisions Incisions: closed neck  Last BM:  2/12  Height:   Ht Readings from Last 1 Encounters:  07/15/21 5\' 10"  (1.778 m)    Weight:   Wt Readings from Last 1 Encounters:  07/13/21 56.1 kg    Ideal Body Weight:  78.2 kg  BMI:  Body mass index is 17.75 kg/m.  Estimated Nutritional Needs:   Kcal:  1800-2000  Protein:  100-115 grams  Fluid:  >1.8 L/day  Lockie Pares., RD, LDN, CNSC See AMiON for contact  information

## 2021-07-18 NOTE — Progress Notes (Signed)
OT Cancellation Note  Patient Details Name: Brian [name] MRN: [id number] DOB: June 11, 1960   Cancelled Treatment:    Reason Eval/Treat Not Completed: Patient not medically ready. Discussed with nsg. Pt sedated/intubated. Will check back later date.  Thornell Mule, OT/L   Acute OT Clinical Specialist Acute Rehabilitation Services Pager [phone] Office [phone]  07/18/2021, 2:45 PM

## 2021-07-18 NOTE — Progress Notes (Signed)
PT Cancellation Note  Patient Details Name: Brian [name] MRN: [id number] DOB: 1960/08/02   Cancelled Treatment:    Reason Eval/Treat Not Completed: Medical issues which prohibited therapy. Pt was sedated and intubated. Will check back at a later date.  Armanda Heritage, SPT   Armanda Heritage 07/18/2021, 3:06 PM

## 2021-07-18 NOTE — Progress Notes (Signed)
ANTICOAGULATION CONSULT NOTE  Pharmacy Consult for bivalirudin Indication: pulmonary embolus  No Known Allergies  Patient Measurements: Height: 5\' 10"  (177.8 cm) Weight: 56.1 kg (123 lb 10.9 oz) IBW/kg (Calculated) : 73 Heparin Dosing Weight: TBW  Vital Signs: Temp: 99 F (37.2 C) (02/22 0400) Temp Source: Axillary (02/22 0400) BP: 106/76 (02/22 0600) Pulse Rate: 94 (02/22 0600)  Labs: Recent Labs    07/15/21 1237 07/15/21 1549 07/15/21 1555 07/15/21 1921 07/16/21 0718 07/16/21 1012 07/16/21 1548 07/17/21 0147 07/17/21 1434 07/17/21 1641 07/18/21 0643  HGB  --    < >  --   --  7.3*  --  8.3* 7.8*  --  8.2* 7.7*  HCT  --    < >  --   --  21.2*  --  23.9* 22.7*  --  24.0* 22.7*  PLT  --   --    < >  --  384  --  389 374  --   --  365  APTT  --    < >  --  81* 71*  --   --  66*  --   --  67*  HEPARINUNFRC <0.10*  --   --   --   --   --   --   --   --   --   --   CREATININE  --   --    < >  --  0.94  --   --  0.95 0.79  --  0.76   0.77  TROPONINIHS  --    < >  --  98*  --  55*  --  37*  --   --   --    < > = values in this interval not displayed.     Estimated Creatinine Clearance: 77.9 mL/min (by C-G formula based on SCr of 0.77 mg/dL).   Medical History: History reviewed. No pertinent past medical history.   Assessment: 64 yom s/p ACDF then exploration and evacuation of epidural hematoma. He is on antibiotics for PNA. He has on heparin but heparin levels were undetectable  x3 and he was changed to bivalirudin.  This AM aPTT is therapeutic at 67 seconds on 0.15 mg/kg/min  Goal of Therapy:  Goal aPTT 55-80 Monitor platelets by anticoagulation protocol: Yes   Plan:  Continue bivalirudin at 0.15 mg/kg/min Daily aPTT, CBC, s/s bleeding  Bertis Ruddy, PharmD Clinical Pharmacist ED Pharmacist Phone # [phone] 07/18/2021 7:43 AM a

## 2021-07-18 NOTE — Progress Notes (Signed)
Pt's ETT was advanced 1 cm and is now @ 27 at the lips. Pt receiving volumes and is stables. RT will continue to monitor

## 2021-07-18 NOTE — Progress Notes (Signed)
eLink Physician-Brief Progress Note Patient Name: Brian [name] DOB: 27-Nov-1960 MRN: [id number]   Date of Service  07/18/2021  HPI/Events of Note  Abdominal distention.  eICU Interventions  Plan: Abdominal film at 5 AM.     Intervention Category Major Interventions: Other:  Lenell Antu 07/18/2021, 1:36 AM

## 2021-07-18 NOTE — Progress Notes (Signed)
Straight cathed X 3 in 24 hours. Bladder scan at this time showing 500cc. Foley cath reinserted per policy.  Patient with XXXL loose BM's with bowel prep. Rectal pouch ineffective. Flexi seal placed with 600cc stool noted immediately after placement. Incontinence care provided, skin C/D/I.

## 2021-07-18 NOTE — Progress Notes (Signed)
No new issues or problems.  Remains intubated and sedated.  Fevers continue to trend down.  White count declining.  Respiratory status reasonably stable.  Continue current management per CCM.  Stable from my standpoint.

## 2021-07-18 NOTE — Progress Notes (Signed)
NAME:  Brian [name], MRN:  [id number], DOB:  May 23, 1961, LOS: 11 ADMISSION DATE:  06/29/2021, CONSULTATION DATE:  07/03/21 REFERRING MD:  Reinaldo Meeker, NP, CHIEF COMPLAINT:  AMS   History of Present Illness:   61 year old male with prior history of tobacco abuse and ETOH use who recently was found to have critical multilevel cervical spinal stenosis C3-6 with spinal cord compression and spinal cord signal change after recent fall with progressive upper and lower extremity weakness and tremors.  He was admitted on 2/3 and underwent ACDF of C3-4, C4-5, C5-5, and C6-7.  Post-operatively he was progressing slowly with residual weakness in both hands but improving lower extremity strength and function.  He developed some difficulty swallowing on 2/5 and SLP ordered and placed on thickened liquids.   Overnight 2/6, patient with increased difficulty swallowing and was made NPO but also noted to have dysarthria and difficulty moving extremities with numbness.  SLP evaluated and found to be moderate aspiration risk.   Also progressively tachycardic, tachypneic, and now febrile.  Code stroke activated and taken for CT/ CTA head and neck and was given decadron 10mg  once.  NIHSS 18.  CTH was negative for acute findings, and CTA head neck did not reveal any LVO but noted significant prevertebral soft tissue swelling with foci of gas present at the ventral epidural space at C4-5, small collection not excluded.  On 2/7, patient with worsening confusion and concern for airway involvement, PCCM consulted for further evaluation.   Pertinent  Medical History  Tobacco abuse, cervical stenosis, ETOH use   Significant Hospital Events: Including procedures, antibiotic start and stop dates in addition to other pertinent events   2/3 ACDF C3-4, C4-5, C5-5, and C6-7 w/ Dr. Annette Stable 2/6 SLP eval for difficulty swallowing 2/7 Code stroke overnight, neg for LVO, CT showing soft tissue swelling.  PCCM consulted for concern of airway  management and AMS.  Went for evacuation of hematoma  2/18 CT Abd/Pelvis: showing bilateral segmental Pes, started on heparin and showing worsening pneumonia, febrile to  103.  PCT rising, 34.6, restarted on abx 2/19 possible aspiration w/ severe hypoxia; intubated; Switching from heparin to angiomax given subtherapeutic levels despite increase in rate. 2/20 Echo shows normal LV systolic function. There was notation of a + McConnell's sign c/w a large pulmonary embolus which is consistent with the finding of bilateral segmental pulmonary emboli noted on CT 07/14/2021. 2/21 rash appreciated on back; stopped cefepim/vanc switched to zosyn; required low dose levo with increase in fentanyl  Images  2/7 CT angio head and neck > No large vessel occlusion, hemodynamically significant stenosis, or evidence of dissection. Significant prevertebral soft tissue swelling. Foci of gas are present the ventral epidural space at C4-C5; a small collection is not excluded.  Interim History / Subjective:  Patient intubated on PRVC  Secretions are thick When stimulated patient becomes agitated and thrashes head side to side Patient does follow some commands; on Precedex 1.2 and fentanyl 200; required low dose levo due to sedation increase  Objective   Blood pressure 106/76, pulse 94, temperature 99 F (37.2 C), temperature source Axillary, resp. rate 16, height 5\' 10"  (1.778 m), weight 56.1 kg, SpO2 100 %.    Vent Mode: PRVC FiO2 (%):  [40 %] 40 % Set Rate:  [20 bmp-24 bmp] 20 bmp Vt Set:  [440 mL-580 mL] 440 mL PEEP:  [5 cmH20-8 cmH20] 5 cmH20 Plateau Pressure:  [14 cmH20-24 cmH20] 19 cmH20   Intake/Output Summary (Last 24  hours) at 07/18/2021 0708 Last data filed at 07/18/2021 0600 Gross per 24 hour  Intake 3880.95 ml  Output 2000 ml  Net 1880.95 ml    Filed Weights   07/11/21 0500 07/12/21 0500 07/13/21 [id number]  Weight: 56.3 kg 56.3 kg 56.1 kg   Examination: General:  critically ill appearing on mech  vent HEENT: MM pink/moist; ETT in place Neuro: on precedex/fentanyl; MAE; follows some commands (sticks tongue out, wiggles toes b/l) CV: s1s2, no m/r/g PULM:  dim clear BS bilaterally; on mech vent PRVC GI: soft, bsx4 active  Extremities: warm/dry, no edema  Skin: no rashes or lesions appreciated  Echo 2/20: LVEF 55-60%; D shaped L ventricle (likely volume overload); + McConnell's sign; Severely reduced systolic R function; Moderately enlarged R ventricle  Resolved Hospital Problem list    Assessment & Plan:   Acute hypoxic respiratory failure secondary to recurrent aspiration pneumonia -2/21 cefepime/vanc stopped due to drug rash; switched to zosyn Dysphagia  P: -continue mech vent PRVC 6-8 cc/kg -wean fio2 for sats >92% -VAP prevention in place -daily SBT/SAT -sedation for RASS 0 to -1 -continue zosyn x 5 days -follow trach culture, mrsa pcr, urine legionella  -continue scheduled duonebs and hypertonic saline nebs -pulm toiletry: cpt -continue cortrack -unable to extubate today due to tachypnea and thick secretions -will need to consider trach/peg if unable to extubate or if patient gets reintubated  Bilateral segmental PE (new 2/18):  - changing heparin to angiomax given sub-therapeutic levels despite increases (also, heparin started no bolus on 2/18 given recent hematoma evac on 2/7).  HFpEF: Echo 2/20 LVEF 55-60%; D shaped L ventricle (likely volume overload); + McConnell's sign; Severely reduced systolic R function; Moderately enlarged R ventricle Tachycardia with rare PVC P:  -continuous telemetry monitoring -continue angiomax -daily weights/strict I/O's -trend bmp/mag/phosph and replete as needed  Sepsis secondary to aspiration pneumonia  -Intermittent Hypotension overnight with tachycardia into 130's  -Urine Strep antigen negative -trach culture, bcx2, mrsa pcr, Legionella pending Hypotension: likely sedation related P: - Trend fever and WBC - follow  cultures - Trend PCT - cefepime/vanc stopped 2/21 due to rash and switched to zosyn x 5 days -required low dose levo likely due to sedation increase; wean levo for map > 65  Severe cervical spinal stenosis with spinal cord compression s/p ACDF C3-C7 on 2/3 c/b epidural hematoma s/p evacuation 2/7 Exam drastically improved s/p evacuation.  P: -NSGY following; appreciate recs  -continue c-collar  Agitation  ETOH abuse Drug Abuse Hx Daily tobacco use  P: -continue precedex/fentanyl for RASS 0 to -1 -increasing seroquel -cont thiamine, folate, mvi -will need tobacco cessation counseling  Hyponatremia -Na 135 on 2/20 P: -trend BMP  Constipation vs fecal impaction Abdominal gaseous distension P: -cont senna, dulcolax and miralax -giving dose of lactulose this am -TF held this morning pending results of abdominal xray  Normocytic anemia Drop in HGB overnight from  9.9 to 7.3 on anticoagulation  No obvious signs of bleeding P: -trend cbc -transfuse per protocol for hgb <7  Protein calorie malnutrition P: -TF held this am pending abdominal xray; consider restarting later today -cont cor trak  Elevated Triglycerides P: -Propofol stopped 2/20  Best Practice (right click and "Reselect all SmartList Selections" daily)   Diet/type: tubefeeds DVT prophylaxis: other angiomax GI prophylaxis: PPI Lines: N/A Foley:  Yes, and it is still needed Code Status:  full code Last date of multidisciplinary goals of care discussion:   2/21 pending  Critical care time: 35 mins  JD Rexene Agent [place] [facility] 07/18/2021, 7:08 AM  Please see [email or web address] for pager details.  From 7A-7P if no response, please call [phone]. After hours, please call ELink [phone].

## 2021-07-19 ENCOUNTER — Inpatient Hospital Stay (HOSPITAL_COMMUNITY): Payer: 59

## 2021-07-19 DIAGNOSIS — G959 Disease of spinal cord, unspecified: Secondary | ICD-10-CM | POA: Diagnosis not present

## 2021-07-19 DIAGNOSIS — R739 Hyperglycemia, unspecified: Secondary | ICD-10-CM | POA: Diagnosis not present

## 2021-07-19 DIAGNOSIS — G9341 Metabolic encephalopathy: Secondary | ICD-10-CM | POA: Diagnosis not present

## 2021-07-19 DIAGNOSIS — J690 Pneumonitis due to inhalation of food and vomit: Secondary | ICD-10-CM | POA: Diagnosis not present

## 2021-07-19 LAB — RENAL FUNCTION PANEL
Albumin: <1.5 g/dL — ABNORMAL LOW (ref 3.5–5.0)
Anion gap: 7 (ref 5–15)
BUN: 16 mg/dL (ref 6–20)
CO2: 25 mmol/L (ref 22–32)
Calcium: 8.8 mg/dL — ABNORMAL LOW (ref 8.9–10.3)
Chloride: 108 mmol/L (ref 98–111)
Creatinine, Ser: 0.74 mg/dL (ref 0.61–1.24)
GFR, Estimated: >60 mL/min (ref >60)
Glucose, Bld: 96 mg/dL (ref 70–99)
Phosphorus: 2.7 mg/dL (ref 2.5–4.6)
Potassium: 3.7 mmol/L (ref 3.5–5.1)
Sodium: 140 mmol/L (ref 135–145)

## 2021-07-19 LAB — CBC
HCT: 21.8 % — ABNORMAL LOW (ref 39.0–52.0)
Hemoglobin: 7.3 g/dL — ABNORMAL LOW (ref 13.0–17.0)
MCH: 33.5 pg (ref 26.0–34.0)
MCHC: 33.5 g/dL (ref 30.0–36.0)
MCV: 100 fL (ref 80.0–100.0)
Platelets: 364 10*3/uL (ref 150–400)
RBC: 2.18 MIL/uL — ABNORMAL LOW (ref 4.22–5.81)
RDW: 15.6 % — ABNORMAL HIGH (ref 11.5–15.5)
WBC: 17.4 10*3/uL — ABNORMAL HIGH (ref 4.0–10.5)
nRBC: 0 % (ref 0.0–0.2)

## 2021-07-19 LAB — PREPARE RBC (CROSSMATCH)

## 2021-07-19 LAB — GLUCOSE, CAPILLARY
Glucose-Capillary: 102 mg/dL — ABNORMAL HIGH (ref 70–99)
Glucose-Capillary: 117 mg/dL — ABNORMAL HIGH (ref 70–99)
Glucose-Capillary: 119 mg/dL — ABNORMAL HIGH (ref 70–99)
Glucose-Capillary: 132 mg/dL — ABNORMAL HIGH (ref 70–99)
Glucose-Capillary: 91 mg/dL (ref 70–99)
Glucose-Capillary: 92 mg/dL (ref 70–99)

## 2021-07-19 LAB — CULTURE, BLOOD (ROUTINE X 2)
Culture: NO GROWTH
Culture: NO GROWTH
Special Requests: ADEQUATE
Special Requests: ADEQUATE

## 2021-07-19 LAB — HEMOGLOBIN AND HEMATOCRIT, BLOOD
HCT: 21 % — ABNORMAL LOW (ref 39.0–52.0)
Hemoglobin: 7 g/dL — ABNORMAL LOW (ref 13.0–17.0)

## 2021-07-19 LAB — POCT I-STAT 7, (LYTES, BLD GAS, ICA,H+H)
Acid-Base Excess: 0 mmol/L (ref 0.0–2.0)
Bicarbonate: 24.1 mmol/L (ref 20.0–28.0)
Calcium, Ion: 1.34 mmol/L (ref 1.15–1.40)
HCT: 17 % — ABNORMAL LOW (ref 39.0–52.0)
Hemoglobin: 5.8 g/dL — CL (ref 13.0–17.0)
O2 Saturation: 88 %
Patient temperature: 98.5
Potassium: 4 mmol/L (ref 3.5–5.1)
Sodium: 139 mmol/L (ref 135–145)
TCO2: 25 mmol/L (ref 22–32)
pCO2 arterial: 34.6 mmHg (ref 32–48)
pH, Arterial: 7.45 (ref 7.35–7.45)
pO2, Arterial: 51 mmHg — ABNORMAL LOW (ref 83–108)

## 2021-07-19 LAB — TRIGLYCERIDES: Triglycerides: 241 mg/dL — ABNORMAL HIGH (ref <150)

## 2021-07-19 LAB — APTT: aPTT: 62 seconds — ABNORMAL HIGH (ref 24–36)

## 2021-07-19 LAB — MAGNESIUM: Magnesium: 1.7 mg/dL (ref 1.7–2.4)

## 2021-07-19 IMAGING — DX DG CHEST 1V PORT
1 series · 1 of 1 positions shown · non-contrast
Comparison: Previous studies including the examination of
[DATE]

CLINICAL DATA: Difficulty breathing

EXAM:
PORTABLE CHEST 1 VIEW

[chest ap]
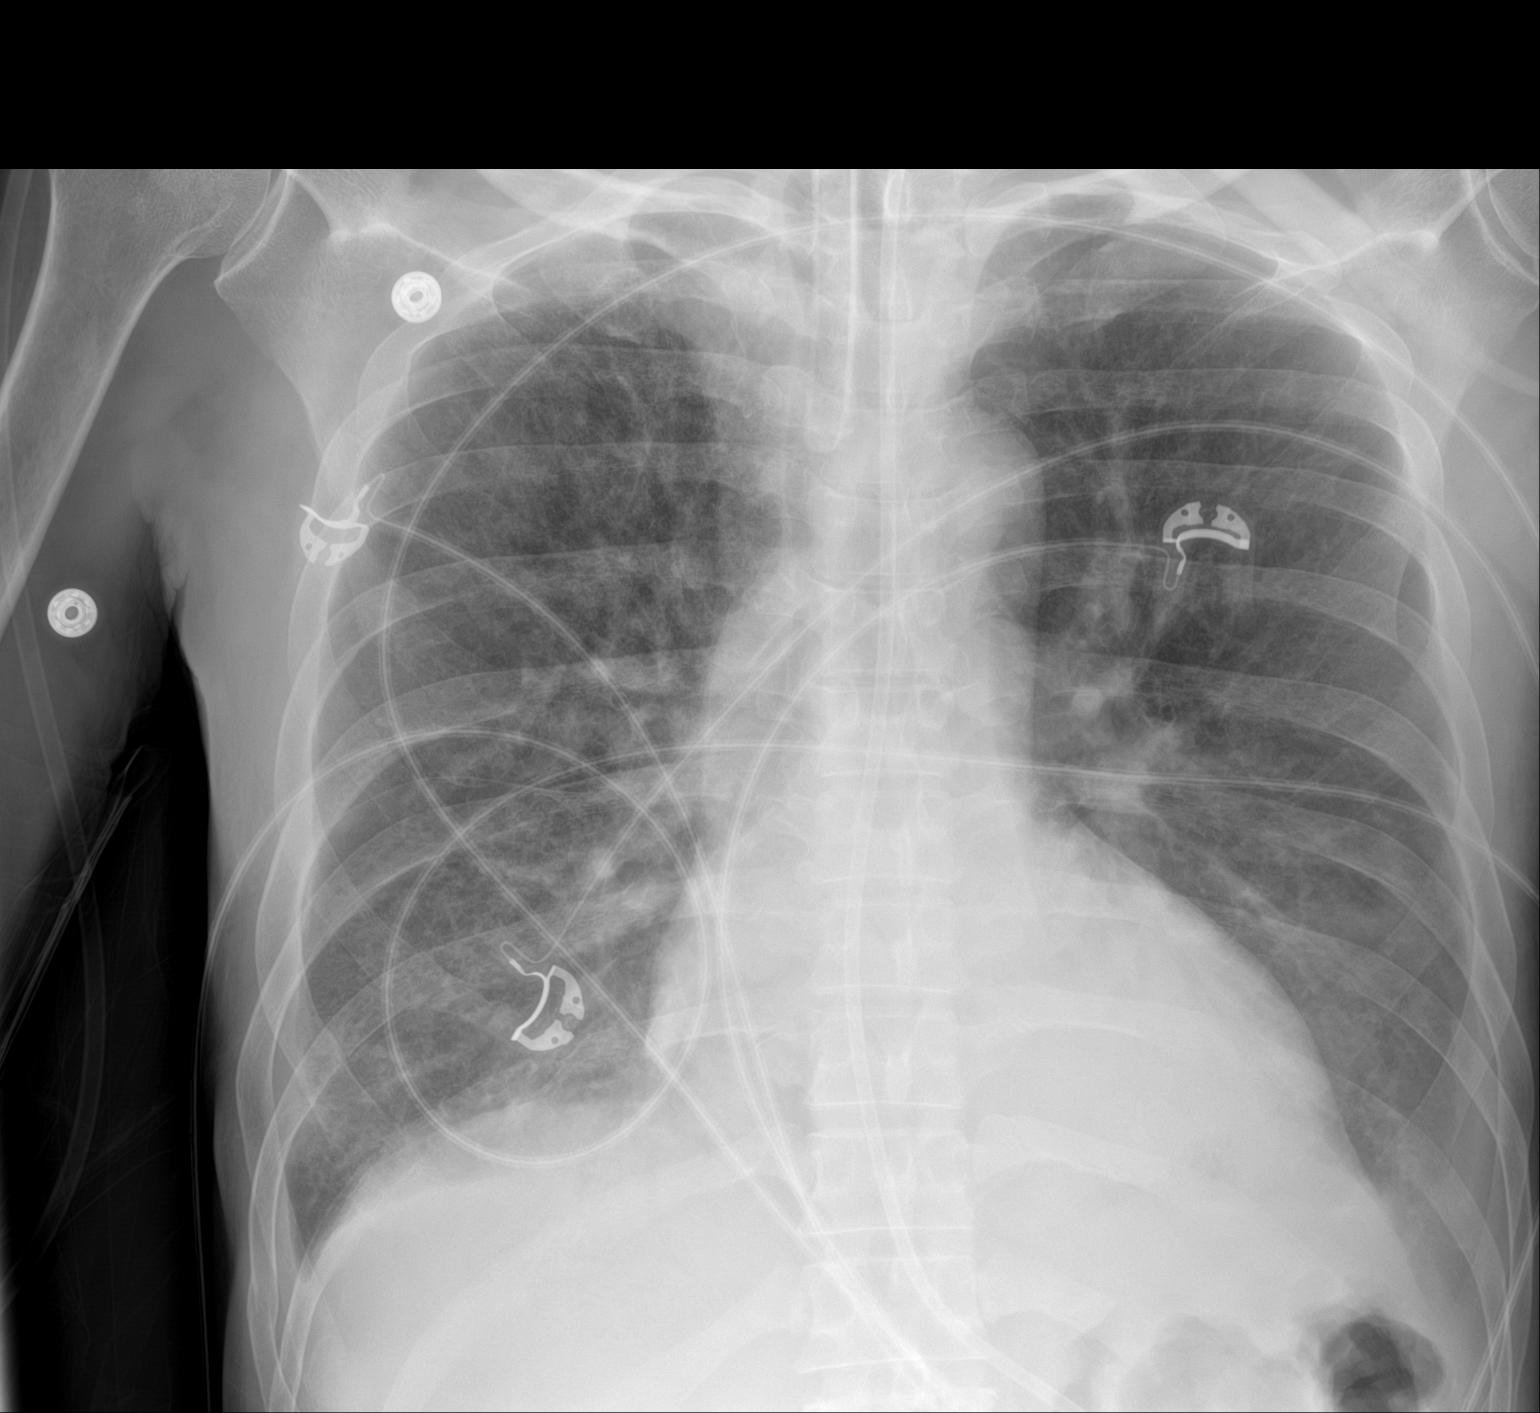

[1 of 1 positions shown; findings below may reference images not displayed]

FINDINGS: Transverse diameter of heart is increased. There is interval
decrease in interstitial markings in the parahilar regions and lower
lung fields suggesting resolving pulmonary edema or resolving
pneumonia. There is homogeneous opacity in the medial left lower
lung fields. There is blunting of both lateral CP angles. There is
no pneumothorax. Tip of endotracheal tube is 5.6 cm above the
carina. Enteric tube is noted traversing the esophagus.
IMPRESSION: There is interval decrease in interstitial markings in both lungs
suggesting resolving pulmonary edema or resolving interstitial
pneumonia. There is infiltrate with air bronchogram in the medial
left lower lung fields suggesting atelectasis/pneumonia. Small
bilateral pleural effusions, more so on the left side.

## 2021-07-19 MED ORDER — IPRATROPIUM-ALBUTEROL 0.5-2.5 (3) MG/3ML IN SOLN
3.0000 mL | Freq: Four times a day (QID) | RESPIRATORY_TRACT | Status: DC
Start: 2021-07-19 — End: 2021-07-20
  Administered 2021-07-19 – 2021-07-20 (×4): 3 mL via RESPIRATORY_TRACT
  Filled 2021-07-19 (×4): qty 3

## 2021-07-19 MED ORDER — MAGNESIUM SULFATE 2 GM/50ML IV SOLN
2.0000 g | Freq: Once | INTRAVENOUS | Status: AC
  Administered 2021-07-19: 2 g via INTRAVENOUS
  Filled 2021-07-19: qty 50

## 2021-07-19 MED ORDER — QUETIAPINE FUMARATE 100 MG PO TABS
100.0000 mg | ORAL_TABLET | Freq: Two times a day (BID) | ORAL | Status: DC
  Administered 2021-07-19 – 2021-07-22 (×6): 100 mg
  Filled 2021-07-19 (×6): qty 1

## 2021-07-19 MED ORDER — POTASSIUM CHLORIDE 20 MEQ PO PACK
40.0000 meq | PACK | Freq: Once | ORAL | Status: AC
  Administered 2021-07-19: 40 meq

## 2021-07-19 MED ORDER — BETHANECHOL CHLORIDE 10 MG PO TABS
10.0000 mg | ORAL_TABLET | Freq: Three times a day (TID) | ORAL | Status: DC
  Administered 2021-07-19 – 2021-09-08 (×147): 10 mg
  Filled 2021-07-19 (×150): qty 1

## 2021-07-19 MED ORDER — SODIUM CHLORIDE 0.9% IV SOLUTION: Freq: Once | INTRAVENOUS | Status: AC

## 2021-07-19 MED ORDER — ALBUMIN HUMAN 5 % IV SOLN
12.5000 g | Freq: Once | INTRAVENOUS | Status: AC
  Administered 2021-07-19: 12.5 g via INTRAVENOUS
  Filled 2021-07-19: qty 250

## 2021-07-19 MED ORDER — QUETIAPINE FUMARATE 25 MG PO TABS
50.0000 mg | ORAL_TABLET | Freq: Once | ORAL | Status: AC
  Administered 2021-07-19: 50 mg via ORAL
  Filled 2021-07-19: qty 2

## 2021-07-19 MED ORDER — SODIUM CHLORIDE 0.9 % IV SOLN
1.0000 mg/kg/h | INTRAVENOUS | Status: DC
  Administered 2021-07-19 – 2021-07-20 (×2): 0.5 mg/kg/h via INTRAVENOUS
  Administered 2021-07-21: 1 mg/kg/h via INTRAVENOUS
  Administered 2021-07-21: 0.5 mg/kg/h via INTRAVENOUS
  Administered 2021-07-22 (×2): 1 mg/kg/h via INTRAVENOUS
  Filled 2021-07-19 (×11): qty 5

## 2021-07-19 NOTE — TOC Progression Note (Signed)
Transition of Care [facility]) - Progression Note    Patient Details  Name: Brian [name] MRN: [id number] Date of Birth: [birth year]  Transition of Care [facility]) CM/SW Bailey, LCSW Phone Number: 07/19/2021, 11:25 AM  Clinical Narrative:    TOC continues to follow patient for medical progression. Placement barriers include lack of insurance coverage for SNF.     Barriers to Discharge: SNF Pending bed offer, SNF Pending payor source - LOG, SNF Pending Medicaid, Inadequate or no insurance  Expected Discharge Plan and Services   In-house Referral: Clinical Social Work                                             Social Determinants of Health (SDOH) Interventions    Readmission Risk Interventions No flowsheet data found.

## 2021-07-19 NOTE — Progress Notes (Signed)
ANTICOAGULATION CONSULT NOTE  Pharmacy Consult for bivalirudin Indication: pulmonary embolus  No Known Allergies  Patient Measurements: Height: 5\' 10"  (177.8 cm) Weight: 56.1 kg (123 lb 10.9 oz) IBW/kg (Calculated) : 73 Heparin Dosing Weight: TBW  Vital Signs: Temp: 98.8 F (37.1 C) (02/23 0400) Temp Source: Axillary (02/23 0400) BP: 113/75 (02/23 0700) Pulse Rate: 113 (02/23 0530)  Labs: Recent Labs    07/16/21 1012 07/16/21 1548 07/17/21 0147 07/17/21 1434 07/17/21 1641 07/18/21 0643 07/19/21 0623  HGB  --    < > 7.8*  --  8.2* 7.7* 7.3*  HCT  --    < > 22.7*  --  24.0* 22.7* 21.8*  PLT  --    < > 374  --   --  365 364  APTT  --   --  66*  --   --  67* 62*  CREATININE  --   --  0.95 0.79  --  0.76   0.77  --   TROPONINIHS 55*  --  37*  --   --   --   --    < > = values in this interval not displayed.     Estimated Creatinine Clearance: 77.9 mL/min (by C-G formula based on SCr of 0.77 mg/dL).   Medical History: History reviewed. No pertinent past medical history.   Assessment: 60 yom s/p ACDF then exploration and evacuation of epidural hematoma. He is on antibiotics for PNA. He has on heparin but heparin levels were undetectable  x3 and he was changed to bivalirudin.  This AM aPTT is therapeutic at 62 seconds on 0.15 mg/kg/min, H/H low but stable, plts 364  Goal of Therapy:  Goal aPTT 55-80 Monitor platelets by anticoagulation protocol: Yes   Plan:  Continue bivalirudin at 0.15 mg/kg/min Daily aPTT, CBC, s/s bleeding  07/21/21, PharmD Clinical Pharmacist ED Pharmacist Phone # [phone] 07/19/2021 7:51 AM a

## 2021-07-19 NOTE — Progress Notes (Signed)
NAME:  Brian [name], MRN:  [id number], DOB:  February 28, 1961, LOS: 20 ADMISSION DATE:  06/29/2021, CONSULTATION DATE:  07/03/21 REFERRING MD:  Doran Durand, NP, CHIEF COMPLAINT:  AMS   History of Present Illness:   61 year old male with prior history of tobacco abuse and ETOH use who recently was found to have critical multilevel cervical spinal stenosis C3-6 with spinal cord compression and spinal cord signal change after recent fall with progressive upper and lower extremity weakness and tremors.  He was admitted on 2/3 and underwent ACDF of C3-4, C4-5, C5-5, and C6-7.  Post-operatively he was progressing slowly with residual weakness in both hands but improving lower extremity strength and function.  He developed some difficulty swallowing on 2/5 and SLP ordered and placed on thickened liquids.   Overnight 2/6, patient with increased difficulty swallowing and was made NPO but also noted to have dysarthria and difficulty moving extremities with numbness.  SLP evaluated and found to be moderate aspiration risk.   Also progressively tachycardic, tachypneic, and now febrile.  Code stroke activated and taken for CT/ CTA head and neck and was given decadron 10mg  once.  NIHSS 18.  CTH was negative for acute findings, and CTA head neck did not reveal any LVO but noted significant prevertebral soft tissue swelling with foci of gas present at the ventral epidural space at C4-5, small collection not excluded.  On 2/7, patient with worsening confusion and concern for airway involvement, PCCM consulted for further evaluation.   Pertinent  Medical History  Tobacco abuse, cervical stenosis, ETOH use   Significant Hospital Events: Including procedures, antibiotic start and stop dates in addition to other pertinent events   2/3 ACDF C3-4, C4-5, C5-5, and C6-7 w/ Dr. 4/7 2/6 SLP eval for difficulty swallowing 2/7 Code stroke overnight, neg for LVO, CT showing soft tissue swelling.  PCCM consulted for concern of airway  management and AMS.  Went for evacuation of hematoma  2/18 CT Abd/Pelvis: showing bilateral segmental Pes, started on heparin and showing worsening pneumonia, febrile to  103.  PCT rising, 34.6, restarted on abx 2/19 possible aspiration w/ severe hypoxia; intubated; Switching from heparin to angiomax given subtherapeutic levels despite increase in rate. 2/20 Echo shows normal LV systolic function. There was notation of a + McConnell's sign c/w a large pulmonary embolus which is consistent with the finding of bilateral segmental pulmonary emboli noted on CT 07/14/2021. 2/21 rash appreciated on back; stopped cefepim/vanc switched to zosyn; required low dose levo with increase in fentanyl 2/22 remains intubated; unable to extubate to do increase RR and thick secretions 2/23 fentanyl weaned down and levo weaned off  Images  2/7 CT angio head and neck > No large vessel occlusion, hemodynamically significant stenosis, or evidence of dissection. Significant prevertebral soft tissue swelling. Foci of gas are present the ventral epidural space at C4-C5; a small collection is not excluded.  Interim History / Subjective:  Patient intubated on SBT  Secretions improving Patient sedated w/ precedex 1.2 and fentanyl 50; More sedated today than yesterday  Objective   Blood pressure 110/80, pulse (!) 113, temperature 98.8 F (37.1 C), temperature source Axillary, resp. rate 16, height 5\' 10"  (1.778 m), weight 56.1 kg, SpO2 97 %.    Vent Mode: PRVC FiO2 (%):  [40 %] 40 % Set Rate:  [20 bmp] 20 bmp Vt Set:  [440 mL] 440 mL PEEP:  [5 cmH20] 5 cmH20 Pressure Support:  [5 cmH20] 5 cmH20 Plateau Pressure:  [14 cmH20-22 cmH20]  14 cmH20   Intake/Output Summary (Last 24 hours) at 07/19/2021 0707 Last data filed at 07/19/2021 0600 Gross per 24 hour  Intake 1518.7 ml  Output 3300 ml  Net -1781.3 ml    Filed Weights   07/11/21 0500 07/12/21 0500 07/13/21 6314  Weight: 56.3 kg 56.3 kg 56.1 kg    Examination: General:  critically ill appearing on mech vent HEENT: MM pink/moist; ETT and cortrak in place Neuro: on precedex/fentanyl; more sedated today; Pupils 1 mm b/l CV: s1s2, no m/r/g PULM:  dim clear BS bilaterally; on mech vent SBT GI: mildly distended; BM yesterday, bsx4 active  Extremities: warm/dry, no edema  Skin: no rashes or lesions appreciated  Echo 2/20: LVEF 55-60%; D shaped L ventricle (likely volume overload); + McConnell's sign; Severely reduced systolic R function; Moderately enlarged R ventricle  Resolved Hospital Problem list   Hyponatremia  Assessment & Plan:   Acute hypoxic respiratory failure secondary to recurrent aspiration pneumonia -2/21 cefepime/vanc stopped due to drug rash; switched to zosyn Dysphagia  P: -continue mech vent  -SBT this am; consider extubation -wean fio2 for sats >92% -VAP prevention in place -wean sedation for RASS 0 to -1 -zosyn stop date today -albuterol neb prn for wheezing -pulm toiletry: cpt -continue cortrack -will need to consider trach/peg if unable to extubate or if patient gets reintubated   Bilateral segmental PE (new 2/18):  - changing heparin to angiomax given sub-therapeutic levels despite increases (also, heparin started no bolus on 2/18 given recent hematoma evac on 2/7).  HFpEF: Echo 2/20 LVEF 55-60%; D shaped L ventricle (likely volume overload); + McConnell's sign; Severely reduced systolic R function; Moderately enlarged R ventricle Tachycardia with rare PVC P:  -continuous telemetry monitoring -continue angiomax -daily weights/strict I/O's -trend bmp/mag/phosph and replete as needed  Sepsis secondary to aspiration pneumonia  -Intermittent Hypotension overnight with tachycardia into 130's  -Urine Strep antigen negative -trach culture, bcx2, mrsa pcr, Legionella pending - cefepime/vanc stopped 2/21 due to rash and switched to zosyn x 5 days Hypotension: likely sedation related P: - Trend fever  and WBC -stopping zosyn today -weaned off levo today after weaning off fentanyl  Severe cervical spinal stenosis with spinal cord compression s/p ACDF C3-C7 on 2/3 c/b epidural hematoma s/p evacuation 2/7 Exam drastically improved s/p evacuation.  P: -NSGY following; appreciate recs  -continue c-collar  Agitation  ETOH abuse Drug Abuse Hx Daily tobacco use  P: -continue to wean precedex/fentanyl for RASS 0 to -1 -continue seroquel; consider increasing -continue enteral morphine and klonopin -cont thiamine, folate, mvi -will need tobacco cessation counseling when able  Constipation vs fecal impaction Abdominal gaseous distension -BM 2/22 after giving NuLytely P: -cont senna, dulcolax and miralax -will restart trickle TF today; advance per dietician  Normocytic anemia Drop in HGB overnight from  9.9 to 7.3 on anticoagulation  No obvious signs of bleeding P: -trend cbc -transfuse per protocol for hgb <7  Protein calorie malnutrition P: -trickle TF today; advance per dietician -cont cor trak  Elevated Triglycerides P: -Propofol stopped 2/20  Best Practice (right click and "Reselect all SmartList Selections" daily)   Diet/type: tubefeeds DVT prophylaxis: other angiomax GI prophylaxis: PPI Lines: N/A Foley:  Yes, and it is still needed Code Status:  full code Last date of multidisciplinary goals of care discussion:   2/21 spoke with son and updated over phone  Critical care time: 35 mins   JD Anselm Lis [place] [facility] 07/19/2021, 7:07 AM  Please see [email or web address]  for pager details.  From 7A-7P if no response, please call [phone]. After hours, please call ELink [phone].

## 2021-07-19 NOTE — Progress Notes (Signed)
Nutrition Follow-up  DOCUMENTATION CODES:   Severe malnutrition in context of social or environmental circumstances  INTERVENTION:   Initiate tube feeding via post pyloric cortrak tube: Vital 1.5 at 20 ml/hr and increase by 10 ml every 4 hours to goal of 50 ml/h (1200 ml per day) - Spoke with RN about admin instructions  Prosource TF 45 ml TID  Provides 1920 kcal, 114 gm protein, 912 ml free water daily   NUTRITION DIAGNOSIS:   Severe Malnutrition related to social / environmental circumstances as evidenced by severe muscle depletion, severe fat depletion. Ongoing.   GOAL:   Patient will meet greater than or equal to 90% of their needs Progressing  MONITOR:   TF tolerance  REASON FOR ASSESSMENT:   Consult Assessment of nutrition requirement/status, Enteral/tube feeding initiation and management  ASSESSMENT:   Pt with hx EtOH abuse, tobacco use, and spinal stenosis initially presented 2/3 for planned multilevel anterior cervical decompression and fusion surgery after experiencing progressive bilateral upper and lower extremity weakness and spasticity due to critical multilevel cervical spinal stenosis with spinal cord compression and signal change.  Pt discussed during ICU rounds and with RN. Per RN pt is more awake on vent and attempting SBT. Possible extubation today.   Pt admitted for planned ACDF then became increasing paralyzed due to epidural hematoma. Plan for rehab but then aspirated and developed PEs and tx to ICU on vent.   KUB shows large fecal burden in the sigmoid and transverse colon with small fecaliths throughout the descending colon.  There is no stool in the rectum. Pt with severe obstipation plan for golytely per tube 2/22 due to lack of results with other meds/suppositories. Pt now with fleixseal and having BMs.   2/3 - Op, anterior cervical discectomy with interbody fusion  2/5 - pt initially complained of worsening swallowing function 2/6 - MBS, NPO  after procedure per SLP 2/7 - code stroke activated (negative) but transferred to ICU 2/7 - Op, Reexploration anterior cervical fusion with evacuation of epidural hematoma 2/8 - Cortrak tube placed  02/10 - MBS, diet advanced to dysphagia 2 with nectar-thick liquids, Cortrak removed 02/11 - NPO 02/13 - diet advanced to dysphagia 2 with nectar-thick liquids 02/16 - diet advanced to dysphagia 2 with thin liquids 02/19- intubated after desaturation, possibly from PO intake 2/20 s/p cortrak tube; post pyloric  2/22 TF off, golytely given 2/23 restarted and advancing TF   Patient is currently intubated on ventilator support  Temp (24hrs), Avg:98.6 F (37 C), Min:98 F (36.7 C), Max:99.8 F (37.7 C)  Medications reviewed and include: colace, folic acid, reglan 10 mg every 8 hours (2/22), MVI with minerals, protonix, senokot-s, thiamine  Angiomax Precedex Mag sulfate x 1    Labs reviewed: TG: 710->241 Iron: 13, TIBC: 203, Ferritin 719 CBG's: 91-95   Diet Order:   Diet Order             Diet NPO time specified  Diet effective now                   EDUCATION NEEDS:   Education needs have been addressed  Skin:  Skin Assessment: Skin Integrity Issues: Skin Integrity Issues:: Incisions Incisions: closed neck  Last BM:  2/12  Height:   Ht Readings from Last 1 Encounters:  07/15/21 5\' 10"  (1.778 m)    Weight:   Wt Readings from Last 1 Encounters:  07/13/21 56.1 kg    Ideal Body Weight:  78.2 kg  BMI:  Body mass index is 17.75 kg/m.  Estimated Nutritional Needs:   Kcal:  1800-2000  Protein:  100-115 grams  Fluid:  >1.8 L/day  Lockie Pares., RD, LDN, CNSC See AMiON for contact information

## 2021-07-19 NOTE — Progress Notes (Signed)
Providing Compassionate, Quality Care - Together   Subjective: Patient is intubated and sedated. Per report, patient did not tolerate spontaneous breathing trial, becoming extremely agitated when sedation was reduced.  Objective: Vital signs in last 24 hours: Temp:  [98 F (36.7 C)-98.8 F (37.1 C)] 98 F (36.7 C) (02/23 0800) Pulse Rate:  [84-137] 113 (02/23 0530) Resp:  [10-30] 14 (02/23 1000) BP: (89-134)/(63-95) 90/63 (02/23 1000) SpO2:  [87 %-100 %] 95 % (02/23 0731) FiO2 (%):  [40 %] 40 % (02/23 0731)  Intake/Output from previous day: 02/22 0701 - 02/23 0700 In: 1518.7 [I.V.:1277.2; NG/GT:80; IV Piggyback:131.5] Out: 3300 [Urine:1300; Stool:2000] Intake/Output this shift: Total I/O In: 537.5 [I.V.:466.3; IV Piggyback:71.3] Out: 175 [Urine:175]  Sedated PERRLA, sluggish Withdraws to pain Incision is clean, dr, and intact  Lab Results: Recent Labs    07/18/21 0643 07/19/21 0623  WBC 17.8* 17.4*  HGB 7.7* 7.3*  HCT 22.7* 21.8*  PLT 365 364   BMET Recent Labs    07/18/21 0643 07/19/21 0623  NA 140   141 140  K 3.9   3.9 3.7  CL 110   110 108  CO2 23   23 25   GLUCOSE 117*   115* 96  BUN 22*   22* 16  CREATININE 0.76   0.77 0.74  CALCIUM 8.6*   8.6* 8.8*    Studies/Results: DG Chest Port 1 View  Result Date: 07/18/2021 CLINICAL DATA:  61 year old male with acute respiratory failure, evaluate endotracheal tube placement. EXAM: PORTABLE CHEST - 1 VIEW COMPARISON:  07/17/2021 FINDINGS: The mediastinal contours are within normal limits. No cardiomegaly. Minimal interval retraction of the endotracheal tube with its tip in the midthoracic trachea. Enteric tube terminates off the inferior aspect of this image, unchanged. Interval increased hazy and reticular opacities throughout the right lung, most prominent about the base. Interval development of mild hazy opacities in the left lower lobe and lingula. Trace bilateral pleural effusions. No pneumothorax. No  acute osseous abnormality. Similar appearance of partially visualized cervical fusion hardware. IMPRESSION: 1. Interval worsening of bilateral, right greater than left, hazy and reticular opacities with associated development of trace bilateral pleural effusions indicative of worsening pulmonary edema versus multifocal pneumonia. 2. Slight interval retraction of endotracheal tube which remains within the midthoracic trachea. Electronically Signed   By: Ruthann Cancer M.D.   On: 07/18/2021 08:41   DG Chest Port 1 View  Result Date: 07/17/2021 CLINICAL DATA:  Acute respiratory failure with hypoxia EXAM: PORTABLE CHEST 1 VIEW COMPARISON:  Chest x-ray 07/17/2021 5:33 a.m. FINDINGS: Endotracheal tube with tip terminating 4 cm above the carina. Enteric tube coursing below the hemidiaphragm with tip collimated off view. The heart and mediastinal contours are unchanged. There is aortic calcification. Similar-appearing bilateral airspace opacities more confluent within the right lung. No pulmonary edema. Nonspecific blunting of the right costophrenic angle likely representing trace pleural effusion. Persistent trace left pleural effusion. No pneumothorax. No acute osseous abnormality. IMPRESSION: 1. Lines and tubes in stable position with the tip of the enteric tube collimated off view. 2. Similar-appearing bilateral airspace opacities. 3. Trace bilateral pleural effusions. Electronically Signed   By: Iven Finn M.D.   On: 07/17/2021 16:08   DG Abd Portable 1V  Result Date: 07/18/2021 CLINICAL DATA:  Abdominal distension EXAM: PORTABLE ABDOMEN - 1 VIEW COMPARISON:  07/16/2021, 07/15/2021 FINDINGS: Enteric tube remains positioned within the stomach. Progressive distension of the cecum with large amount of fecal material. Gaseous distension of the transverse colon. No abnormally  dilated loops of small bowel. No gross free intraperitoneal air on AP portable supine view. IMPRESSION: 1. Progressive distension of the  cecum with large amount of fecal material. Gaseous distension of the transverse colon. No abnormally dilated small bowel. 2. Enteric tube remains within the stomach. Electronically Signed   By: Davina Poke D.O.   On: 07/18/2021 08:25    Assessment/Plan: Patient status post C3-4, C4-5, C5-6, C6-7 anterior cervical discectomy with interbody fusion by Dr. Annette Stable on 06/29/2021. Increased difficulty swallowing with lung atelectasis and elevated temperatures on 07/02/2021. Made NPO following MBS by SLP. 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 CTA head and neck negative for stroke. MRI revealed epidural hematoma. Patient underwent exploration of his cervical fusion with evacuation of the epidural hematoma on 07/03/2021. Cortrak was placed on 07/04/2021. Patient's strength much improved since epidural hematoma evacuation. Cortrak removed 07/06/2021 and dysphagia 2 diet with nectar thick liquids started. Patient with delirium vs ETOH withdrawal. Discontinued steroids 07/06/2021. CIWA protocol started and discontinued 07/07/2021. CT and MRI 07/07/2021 negative. Patient developed respiratory distress, requiring intubation on 07/15/2021. Work up revealed aspiration PNA and PE.   LOS: 20 days   -Continue supportive efforts   Viona Gilmore, DNP, AGNP-C Nurse Practitioner  [facility] [address], [place], [place] [address] P: [phone]     F: [phone]  07/19/2021, 12:06 PM

## 2021-07-19 NOTE — Progress Notes (Signed)
Attempted to contact patient's son and give update over phone but unable to reach.  JD Anselm Lis [facility] 07/19/2021, 3:17 PM  Please see [email or web address] for pager details.  From 7A-7P if no response, please call [phone]. After hours, please call ELink [phone].

## 2021-07-20 DIAGNOSIS — G9341 Metabolic encephalopathy: Secondary | ICD-10-CM | POA: Diagnosis not present

## 2021-07-20 DIAGNOSIS — G959 Disease of spinal cord, unspecified: Secondary | ICD-10-CM | POA: Diagnosis not present

## 2021-07-20 DIAGNOSIS — R739 Hyperglycemia, unspecified: Secondary | ICD-10-CM | POA: Diagnosis not present

## 2021-07-20 DIAGNOSIS — J690 Pneumonitis due to inhalation of food and vomit: Secondary | ICD-10-CM | POA: Diagnosis not present

## 2021-07-20 LAB — RENAL FUNCTION PANEL
Albumin: <1.5 g/dL — ABNORMAL LOW (ref 3.5–5.0)
Anion gap: 5 (ref 5–15)
BUN: 12 mg/dL (ref 6–20)
CO2: 27 mmol/L (ref 22–32)
Calcium: 9 mg/dL (ref 8.9–10.3)
Chloride: 108 mmol/L (ref 98–111)
Creatinine, Ser: 0.66 mg/dL (ref 0.61–1.24)
GFR, Estimated: >60 mL/min (ref >60)
Glucose, Bld: 139 mg/dL — ABNORMAL HIGH (ref 70–99)
Phosphorus: 2.3 mg/dL — ABNORMAL LOW (ref 2.5–4.6)
Potassium: 3.8 mmol/L (ref 3.5–5.1)
Sodium: 140 mmol/L (ref 135–145)

## 2021-07-20 LAB — CBC
HCT: 26.7 % — ABNORMAL LOW (ref 39.0–52.0)
Hemoglobin: 8.6 g/dL — ABNORMAL LOW (ref 13.0–17.0)
MCH: 31.5 pg (ref 26.0–34.0)
MCHC: 32.2 g/dL (ref 30.0–36.0)
MCV: 97.8 fL (ref 80.0–100.0)
Platelets: 364 10*3/uL (ref 150–400)
RBC: 2.73 MIL/uL — ABNORMAL LOW (ref 4.22–5.81)
RDW: 17.3 % — ABNORMAL HIGH (ref 11.5–15.5)
WBC: 14.3 10*3/uL — ABNORMAL HIGH (ref 4.0–10.5)
nRBC: 0.2 % (ref 0.0–0.2)

## 2021-07-20 LAB — GLUCOSE, CAPILLARY
Glucose-Capillary: 144 mg/dL — ABNORMAL HIGH (ref 70–99)
Glucose-Capillary: 129 mg/dL — ABNORMAL HIGH (ref 70–99)
Glucose-Capillary: 114 mg/dL — ABNORMAL HIGH (ref 70–99)
Glucose-Capillary: 104 mg/dL — ABNORMAL HIGH (ref 70–99)
Glucose-Capillary: 117 mg/dL — ABNORMAL HIGH (ref 70–99)

## 2021-07-20 LAB — TYPE AND SCREEN
ABO/RH(D): O POS
Antibody Screen: NEGATIVE
Unit division: 0

## 2021-07-20 LAB — BPAM RBC
Blood Product Expiration Date: 202303242359
ISSUE DATE / TIME: 202302231640
Unit Type and Rh: 5100

## 2021-07-20 LAB — APTT: aPTT: 58 seconds — ABNORMAL HIGH (ref 24–36)

## 2021-07-20 MED ORDER — K PHOS MONO-SOD PHOS DI & MONO 155-852-130 MG PO TABS
250.0000 mg | ORAL_TABLET | Freq: Four times a day (QID) | ORAL | Status: AC
  Administered 2021-07-20 (×4): 250 mg
  Filled 2021-07-20 (×4): qty 1

## 2021-07-20 MED ORDER — IPRATROPIUM-ALBUTEROL 0.5-2.5 (3) MG/3ML IN SOLN
3.0000 mL | RESPIRATORY_TRACT | Status: DC | PRN
  Administered 2021-07-29: 3 mL via RESPIRATORY_TRACT
  Filled 2021-07-20: qty 3

## 2021-07-20 MED ORDER — K PHOS MONO-SOD PHOS DI & MONO 155-852-130 MG PO TABS
250.0000 mg | ORAL_TABLET | Freq: Four times a day (QID) | ORAL | Status: DC
  Filled 2021-07-20 (×2): qty 1

## 2021-07-20 NOTE — Progress Notes (Deleted)
Attempted to get report x 1. 

## 2021-07-20 NOTE — Progress Notes (Signed)
ANTICOAGULATION CONSULT NOTE  Pharmacy Consult for bivalirudin Indication: pulmonary embolus  No Known Allergies  Patient Measurements: Height: 5\' 10"  (177.8 cm) Weight: 59.2 kg (130 lb 8.2 oz) (2 pillows, 1 sheet) IBW/kg (Calculated) : 73 Heparin Dosing Weight: TBW  Vital Signs: Temp: 97.6 F (36.4 C) (02/24 0400) Temp Source: Oral (02/24 0400) BP: 122/71 (02/24 0630) Pulse Rate: 86 (02/24 0630)  Labs: Recent Labs    07/18/21 0643 07/19/21 0623 07/19/21 1437 07/19/21 1507 07/20/21 0341  HGB 7.7* 7.3* 5.8* 7.0* 8.6*  HCT 22.7* 21.8* 17.0* 21.0* 26.7*  PLT 365 364  --   --  364  APTT 67* 62*  --   --  58*  CREATININE 0.76   0.77 0.74  --   --  0.66     Estimated Creatinine Clearance: 82.2 mL/min (by C-G formula based on SCr of 0.66 mg/dL).   Medical History: History reviewed. No pertinent past medical history.   Assessment: 53 yom s/p ACDF then exploration and evacuation of epidural hematoma. He is on antibiotics for PNA. He has on heparin but heparin levels were undetectable  x3 and he was changed to bivalirudin.  This AM aPTT remains therapeutic at 58 seconds on 0.15 mg/kg/min, H/H up slightly to 8.6/26.7, plts 364  Goal of Therapy:  Goal aPTT 55-80 Monitor platelets by anticoagulation protocol: Yes   Plan:  Continue bivalirudin at 0.15 mg/kg/min Daily aPTT, CBC, s/s bleeding  Bertis Ruddy, PharmD Clinical Pharmacist ED Pharmacist Phone # [phone] 07/20/2021 7:29 AM a

## 2021-07-20 NOTE — Progress Notes (Signed)
OT Cancellation Note  Patient Details Name: Brian [name] MRN: [id number] DOB: September 06, 1960   Cancelled Treatment:    Reason Eval/Treat Not Completed: Medical issues which prohibited therapy.  Patient remains intubated and sedated.  OT to check on this patient Monday 2/27 as appropriate.    Shamara Soza D Ronnett Pullin 07/20/2021, 2:47 PM

## 2021-07-20 NOTE — Progress Notes (Signed)
SLP Cancellation Note  Patient Details Name: Brian [name] MRN: [id number] DOB: 03-May-1961   Cancelled treatment:       Reason Eval/Treat Not Completed: Patient not medically ready (Pt currently intubated. SLP will follow up next week unless clinically indicated sooner.)  Reola Buckles I. Vear Clock, MS, CCC-SLP Acute Rehabilitation Services Office number [phone] Pager [phone]  Scheryl Marten 07/20/2021, 9:01 AM

## 2021-07-20 NOTE — Progress Notes (Signed)
Physical Therapy Treatment and Discharge. Patient Details Name: Brian [name] MRN: [id number] DOB: [birth year] Today's Date: 07/20/2021   History of Present Illness Pt is a 61 y/o male admitted 06/29/21 following C3-7 ACDF after progressive cervical myelopathy symptoms including ataxia, bil UE and LE shaking and weakness, and loss of function in his hands with subsequent fall. Pt initially had improvement in neurological symptoms, but postoperative course was complicated by dysphasia and ataxia secondary to and epidural hematoma with evacuation of hematoma on 2/7. Pt also experienced delirium secondary to encephalopathy. He experienced aspiration pneumonia with subsequent sepsis while in the hospital. Pt was intubated and sedated. PMHx includes tobacco and EtOH use disorder.    PT Comments    Performed bed side ROM and positioning for comfort. Had HR elevation into the 170's. Continues with mucus plugging, nurse suctioned mucus. Pt is no longer appropriate for physical therapy due to medical decline. Will need new orders when stable.    Recommendations for follow up therapy are one component of a multi-disciplinary discharge planning process, led by the attending physician.  Recommendations may be updated based on patient status, additional functional criteria and insurance authorization.  Follow Up Recommendations        Assistance Recommended at Discharge Frequent or constant Supervision/Assistance  Patient can return home with the following Two people to help with walking and/or transfers;Two people to help with bathing/dressing/bathroom;Direct supervision/assist for medications management;Direct supervision/assist for financial management;Assist for transportation;Assistance with feeding;Assistance with cooking/housework;Help with stairs or ramp for entrance   Equipment Recommendations  Wheelchair (measurements PT);Hospital bed;Wheelchair cushion (measurements PT);BSC/3in1;Other (comment)     Recommendations for Other Services       Precautions / Restrictions Precautions Precautions: Fall;Cervical Required Braces or Orthoses: Cervical Brace Cervical Brace: Soft collar;At all times Restrictions Weight Bearing Restrictions: No     Mobility  Bed Mobility Overal bed mobility: Needs Assistance Bed Mobility: Rolling Rolling: Total assist         General bed mobility comments: Pt was repositioned by rolling with total assist.    Transfers                        Ambulation/Gait                   Stairs             Wheelchair Mobility    Modified Rankin (Stroke Patients Only)       Balance                                            Cognition Arousal/Alertness: Lethargic Behavior During Therapy: Restless Overall Cognitive Status: Difficult to assess                                 General Comments: sedated and on vent        Exercises General Exercises - Upper Extremity Shoulder Flexion: Supine, PROM, Both, 20 reps Shoulder ABduction: PROM, Supine, Both, 20 reps Elbow Flexion: PROM, Supine, Both, 20 reps General Exercises - Lower Extremity Ankle Circles/Pumps: PROM, Supine, Both, 20 reps Heel Raises: PROM, Supine, Both, 20 reps    General Comments        Pertinent Vitals/Pain Pain Assessment Pain Assessment: CPOT Facial Expression: Grimacing Body Movements: Absence of movements Muscle  Tension: Tense, rigid Compliance with ventilator (intubated pts.): Tolerating ventilator or movement Vocalization (extubated pts.): N/A CPOT Total: 3 Pain Location: Pt was sedated and intubated during session    Home Living                          Prior Function            PT Goals (current goals can now be found in the care plan section) Progress towards PT goals: Not progressing toward goals - comment    Frequency    Other (Comment)      PT Plan Other (comment)  (Discharged for medical reasons)    Co-evaluation              AM-PAC PT "6 Clicks" Mobility   Outcome Measure  Help needed turning from your back to your side while in a flat bed without using bedrails?: Total Help needed moving from lying on your back to sitting on the side of a flat bed without using bedrails?: Total Help needed moving to and from a bed to a chair (including a wheelchair)?: Total Help needed standing up from a chair using your arms (e.g., wheelchair or bedside chair)?: Total Help needed to walk in hospital room?: Total Help needed climbing 3-5 steps with a railing? : Total 6 Click Score: 6    End of Session Equipment Utilized During Treatment: Gait belt;Cervical collar Activity Tolerance: Patient limited by fatigue;Patient limited by pain Patient left: in bed;with nursing/sitter in room Nurse Communication: Mobility status;Other (comment) (pt was tachycardia) PT Visit Diagnosis: Unsteadiness on feet (R26.81);History of falling (Z91.81);Muscle weakness (generalized) (M62.81);Other symptoms and signs involving the nervous system (R29.898);Difficulty in walking, not elsewhere classified (R26.2);Ataxic gait (R26.0)     Time: [id number] PT Time Calculation (min) (ACUTE ONLY): 24 min  Charges:  $Therapeutic Exercise: 23-37 mins                     Quenton Fetter, SPT    Quenton Fetter 07/20/2021, 5:10 PM

## 2021-07-20 NOTE — Progress Notes (Signed)
Spoke with neurosurgery about indication for foley catheter. Since patient has neurogenic bladder, foley catheter will remain in at this time as the patient is unable to excrete urine on his own.   Beryl Meager, RN

## 2021-07-20 NOTE — Progress Notes (Signed)
NAME:  JAYVONTE KINT, MRN:  [id number], DOB:  [birth year], LOS: 21 ADMISSION DATE:  06/29/2021, CONSULTATION DATE:  07/03/21 REFERRING MD:  Reinaldo Meeker, NP, CHIEF COMPLAINT:  AMS   History of Present Illness:   61 year old male with prior history of tobacco abuse and ETOH use who recently was found to have critical multilevel cervical spinal stenosis C3-6 with spinal cord compression and spinal cord signal change after recent fall with progressive upper and lower extremity weakness and tremors.  He was admitted on 2/3 and underwent ACDF of C3-4, C4-5, C5-5, and C6-7.  Post-operatively he was progressing slowly with residual weakness in both hands but improving lower extremity strength and function.  He developed some difficulty swallowing on 2/5 and SLP ordered and placed on thickened liquids.   Overnight 2/6, patient with increased difficulty swallowing and was made NPO but also noted to have dysarthria and difficulty moving extremities with numbness.  SLP evaluated and found to be moderate aspiration risk.   Also progressively tachycardic, tachypneic, and now febrile.  Code stroke activated and taken for CT/ CTA head and neck and was given decadron 10mg  once.  NIHSS 18.  CTH was negative for acute findings, and CTA head neck did not reveal any LVO but noted significant prevertebral soft tissue swelling with foci of gas present at the ventral epidural space at C4-5, small collection not excluded.  On 2/7, patient with worsening confusion and concern for airway involvement, PCCM consulted for further evaluation.   Pertinent  Medical History  Tobacco abuse, cervical stenosis, ETOH use   Significant Hospital Events: Including procedures, antibiotic start and stop dates in addition to other pertinent events   2/3 ACDF C3-4, C4-5, C5-5, and C6-7 w/ Dr. Annette Stable 2/6 SLP eval for difficulty swallowing 2/7 Code stroke overnight, neg for LVO, CT showing soft tissue swelling.  PCCM consulted for concern of airway  management and AMS.  Went for evacuation of hematoma  2/18 CT Abd/Pelvis: showing bilateral segmental Pes, started on heparin and showing worsening pneumonia, febrile to  103.  PCT rising, 34.6, restarted on abx 2/19 possible aspiration w/ severe hypoxia; intubated; Switching from heparin to angiomax given subtherapeutic levels despite increase in rate. 2/20 Echo shows normal LV systolic function. There was notation of a + McConnell's sign c/w a large pulmonary embolus which is consistent with the finding of bilateral segmental pulmonary emboli noted on CT 07/14/2021. 2/21 rash appreciated on back; stopped cefepim/vanc switched to zosyn; required low dose levo with increase in fentanyl 2/22 remains intubated; unable to extubate to do increase RR and thick secretions 2/23 [name] infusion added 2/24: weaning fentanyl.   Images  2/7 CT angio head and neck > No large vessel occlusion, hemodynamically significant stenosis, or evidence of dissection. Significant prevertebral soft tissue swelling. Foci of gas are present the ventral epidural space at C4-C5; a small collection is not excluded.  Interim History / Subjective:  No acute events overnight.  Sedation adjusted yesterday, remains on ketamine, precedex and fentanyl infusions.  + stool  Objective   Blood pressure 122/71, pulse 86, temperature 97.6 F (36.4 C), temperature source Oral, resp. rate 19, height 5\' 10"  (1.778 m), weight 59.2 kg, SpO2 97 %.    Vent Mode: PRVC FiO2 (%):  [40 %] 40 % Set Rate:  [20 bmp] 20 bmp Vt Set:  [440 mL] 440 mL PEEP:  [5 cmH20] 5 cmH20 Pressure Support:  [8 cmH20-12 cmH20] 12 cmH20 Plateau Pressure:  [14 cmH20-18 cmH20] 17 cmH20  Intake/Output Summary (Last 24 hours) at 07/20/2021 [id number] Last data filed at 07/20/2021 0600 Gross per 24 hour  Intake 3155.1 ml  Output 2975 ml  Net 180.1 ml    Filed Weights   07/12/21 0500 07/13/21 0337 07/20/21 0500  Weight: 56.3 kg 56.1 kg 59.2 kg    Examination: General:  Adult male, intubated and sedated with RASS -4 to -5 HEENT: MM pink/moist; ETT and cortrak in place Neuro: RASS -4, pupils 2 and reactive, minimal movement in extremities with stimulation CV: sinus rhythm on telemetry.  S1/S1, no murmur. No edema. Warm and well perfused, palpable pulses PULM:  Symmetric expansion, small amount of beige secretions.  GI: mild distention, positive bowel sounds Extremities: warm/dry Skin: no rashes or lesions appreciated  Echo 2/20: LVEF 55-60%; D shaped L ventricle (likely volume overload); + McConnell's sign; Severely reduced systolic R function; Moderately enlarged R ventricle  Resolved Hospital Problem list   Hyponatremia Elevated triglycerides in setting of propofol  Assessment & Plan:   Acute hypoxic respiratory failure secondary to recurrent aspiration pneumonia -2/21 cefepime/vanc stopped due to drug rash; switched to zosyn and completed course of antibiotics Dysphagia  P: -scheduled duo nebs, guaifenesin. Not bronchospastic on exam and improving secretions. Change duo-neb to PRN.  -Appropriate compliance and minimal vent settings. Concern degree of agitation has prevented tolerance of SBT. Continue adjustments in sedation. May ultimately require trach.   Bilateral segmental PE (new 2/18):  - Previously on heparin, changed to angiomax given sub-therapeutic levels despite increases (also, heparin started no bolus on 2/18 given recent hematoma evac on 2/7).  HFpEF: Echo 2/20 LVEF 55-60%; D shaped L ventricle (likely volume overload); + McConnell's sign; Severely reduced systolic R function; Moderately enlarged R ventricle Tachycardia with rare PVC P:  -continue Angiomax. Will eventually need transition to oral agents.   Sepsis secondary to aspiration pneumonia  -Urine Strep antigen negative -trach culture, bcx2, mrsa pcr, Legionella pending -cefepime/vanc stopped 2/21 due to rash. Finished abx course with  Zosyn Hypotension: likely sedation related P: -Wean sedation and levophed as tolerated.   Severe cervical spinal stenosis with spinal cord compression s/p ACDF C3-C7 on 2/3 c/b epidural hematoma s/p evacuation 2/7 Exam drastically improved s/p evacuation.  P: -NSGY following; appreciate recs  -continue c-collar  Agitation  ETOH abuse Drug Abuse Hx Daily tobacco use  P: -continue seroquel, morphine and klonopin.  Continue Ketamine and Precedex infusion. Will trial weaning of Fentanyl today -Repeat EKG to eval QTC -cont thiamine, folate, mvi -will need tobacco cessation counseling when able  Constipation vs fecal impaction Abdominal gaseous distension -BM 2/22 after giving NuLytely P: -cont senna, dulcolax and miralax -scheduled reglan -Fecal management system in place  Normocytic anemia P: -transfuse per protocol for hgb <7  Protein calorie malnutrition P: -Tolerating tube feeds -cont cor trak  Urinary retention --Urecholine started 2/23, continue foley  Best Practice (right click and "Reselect all SmartList Selections" daily)   Diet/type: tubefeeds DVT prophylaxis: other angiomax GI prophylaxis: PPI Lines: N/A Foley:  Yes, and it is still needed Code Status:  full code Last date of multidisciplinary goals of care discussion:  Point of contact, son [name]. Attempted to call and update, no answer.  Critical care time: 40 mins   Paulita Fujita, ACNP [facility] 07/20/2021, 7:28 AM  Please use secure chat.  From 7A-7P if no response, please call [phone]. After hours, please call ELink [phone].

## 2021-07-20 NOTE — Progress Notes (Signed)
Notified CCM of patient's persistent tachycardia. CCM at bedside. No new orders at this time.   Beryl Meager, RN

## 2021-07-20 NOTE — Progress Notes (Signed)
Providing Compassionate, Quality Care - Together   Subjective: Nurse reports patient significantly agitated when fentanyl is turned down. The patient has not made much progress with weaning on the ventilator due to this.  Objective: Vital signs in last 24 hours: Temp:  [97.1 F (36.2 C)-98.5 F (36.9 C)] 98 F (36.7 C) (02/24 0800) Pulse Rate:  [78-176] 102 (02/24 0800) Resp:  [8-28] 17 (02/24 0800) BP: (80-165)/(56-149) 114/68 (02/24 0800) SpO2:  [87 %-100 %] 97 % (02/24 0800) FiO2 (%):  [40 %] 40 % (02/24 0729) Weight:  [59.2 kg] 59.2 kg (02/24 0500)  Intake/Output from previous day: 02/23 0701 - 02/24 0700 In: 3253.7 [I.V.:1680; Blood:315; NG/GT:910; IV Piggyback:348.7] Out: 2975 [Urine:2075; Stool:900] Intake/Output this shift: Total I/O In: 71.2 [I.V.:21.2; NG/GT:50] Out: -   Sedated PERRLA, sluggish Withdraws to pain Incision is clean, dry, and intact    Lab Results: Recent Labs    07/19/21 0623 07/19/21 1437 07/19/21 1507 07/20/21 0341  WBC 17.4*  --   --  14.3*  HGB 7.3*   < > 7.0* 8.6*  HCT 21.8*   < > 21.0* 26.7*  PLT 364  --   --  364   < > = values in this interval not displayed.   BMET Recent Labs    07/19/21 0623 07/19/21 1437 07/20/21 0341  NA 140 139 140  K 3.7 4.0 3.8  CL 108  --  108  CO2 25  --  27  GLUCOSE 96  --  139*  BUN 16  --  12  CREATININE 0.74  --  0.66  CALCIUM 8.8*  --  9.0    Studies/Results: DG CHEST PORT 1 VIEW  Result Date: 07/19/2021 CLINICAL DATA:  Difficulty breathing EXAM: PORTABLE CHEST 1 VIEW COMPARISON:  Previous studies including the examination of 07/18/2021 FINDINGS: Transverse diameter of heart is increased. There is interval decrease in interstitial markings in the parahilar regions and lower lung fields suggesting resolving pulmonary edema or resolving pneumonia. There is homogeneous opacity in the medial left lower lung fields. There is blunting of both lateral CP angles. There is no pneumothorax.  Tip of endotracheal tube is 5.6 cm above the carina. Enteric tube is noted traversing the esophagus. IMPRESSION: There is interval decrease in interstitial markings in both lungs suggesting resolving pulmonary edema or resolving interstitial pneumonia. There is infiltrate with air bronchogram in the medial left lower lung fields suggesting atelectasis/pneumonia. Small bilateral pleural effusions, more so on the left side. Electronically Signed   By: Elmer Picker M.D.   On: 07/19/2021 13:07    Assessment/Plan: Patient status post C3-4, C4-5, C5-6, C6-7 anterior cervical discectomy with interbody fusion by Dr. Annette Stable on 06/29/2021. Increased difficulty swallowing with lung atelectasis and elevated temperatures on 07/02/2021. Made NPO following MBS by SLP. 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 CTA head and neck negative for stroke. MRI revealed epidural hematoma. Patient underwent exploration of his cervical fusion with evacuation of the epidural hematoma on 07/03/2021. Cortrak was placed on 07/04/2021. Patient's strength much improved since epidural hematoma evacuation. Cortrak removed 07/06/2021 and dysphagia 2 diet with nectar thick liquids started. Patient with delirium vs ETOH withdrawal. Discontinued steroids 07/06/2021. CIWA protocol started and discontinued 07/07/2021. CT and MRI 07/07/2021 negative. Patient developed respiratory distress, requiring intubation on 07/15/2021. Work up revealed aspiration PNA and PE.   LOS: 21 days   -Continue supportive efforts -Maintain Foley catheter -Patient might need to be considered  for a trach if he continues to fail SBTs.   Viona Gilmore, DNP, AGNP-C Nurse Practitioner  [facility] [place] [address] [place], [place], [place] [address] P: [phone]     F: [phone]  07/20/2021, 8:52 AM

## 2021-07-21 DIAGNOSIS — G959 Disease of spinal cord, unspecified: Secondary | ICD-10-CM | POA: Diagnosis not present

## 2021-07-21 DIAGNOSIS — G9341 Metabolic encephalopathy: Secondary | ICD-10-CM | POA: Diagnosis not present

## 2021-07-21 DIAGNOSIS — J690 Pneumonitis due to inhalation of food and vomit: Secondary | ICD-10-CM | POA: Diagnosis not present

## 2021-07-21 DIAGNOSIS — R739 Hyperglycemia, unspecified: Secondary | ICD-10-CM | POA: Diagnosis not present

## 2021-07-21 LAB — RENAL FUNCTION PANEL
Albumin: <1.5 g/dL — ABNORMAL LOW (ref 3.5–5.0)
Anion gap: 7 (ref 5–15)
BUN: 12 mg/dL (ref 6–20)
CO2: 29 mmol/L (ref 22–32)
Calcium: 8.9 mg/dL (ref 8.9–10.3)
Chloride: 105 mmol/L (ref 98–111)
Creatinine, Ser: 0.69 mg/dL (ref 0.61–1.24)
GFR, Estimated: >60 mL/min (ref >60)
Glucose, Bld: 120 mg/dL — ABNORMAL HIGH (ref 70–99)
Phosphorus: 3 mg/dL (ref 2.5–4.6)
Potassium: 3.7 mmol/L (ref 3.5–5.1)
Sodium: 141 mmol/L (ref 135–145)

## 2021-07-21 LAB — BASIC METABOLIC PANEL
Anion gap: 6 (ref 5–15)
BUN: 14 mg/dL (ref 6–20)
CO2: 30 mmol/L (ref 22–32)
Calcium: 9 mg/dL (ref 8.9–10.3)
Chloride: 105 mmol/L (ref 98–111)
Creatinine, Ser: 0.69 mg/dL (ref 0.61–1.24)
GFR, Estimated: >60 mL/min (ref >60)
Glucose, Bld: 121 mg/dL — ABNORMAL HIGH (ref 70–99)
Potassium: 3.7 mmol/L (ref 3.5–5.1)
Sodium: 141 mmol/L (ref 135–145)

## 2021-07-21 LAB — CBC
HCT: 26.3 % — ABNORMAL LOW (ref 39.0–52.0)
Hemoglobin: 8.5 g/dL — ABNORMAL LOW (ref 13.0–17.0)
MCH: 31.5 pg (ref 26.0–34.0)
MCHC: 32.3 g/dL (ref 30.0–36.0)
MCV: 97.4 fL (ref 80.0–100.0)
Platelets: 374 10*3/uL (ref 150–400)
RBC: 2.7 MIL/uL — ABNORMAL LOW (ref 4.22–5.81)
RDW: 16.6 % — ABNORMAL HIGH (ref 11.5–15.5)
WBC: 13.7 10*3/uL — ABNORMAL HIGH (ref 4.0–10.5)
nRBC: 0.2 % (ref 0.0–0.2)

## 2021-07-21 LAB — GLUCOSE, CAPILLARY
Glucose-Capillary: 100 mg/dL — ABNORMAL HIGH (ref 70–99)
Glucose-Capillary: 122 mg/dL — ABNORMAL HIGH (ref 70–99)
Glucose-Capillary: 126 mg/dL — ABNORMAL HIGH (ref 70–99)
Glucose-Capillary: 129 mg/dL — ABNORMAL HIGH (ref 70–99)
Glucose-Capillary: 134 mg/dL — ABNORMAL HIGH (ref 70–99)
Glucose-Capillary: 138 mg/dL — ABNORMAL HIGH (ref 70–99)
Glucose-Capillary: 143 mg/dL — ABNORMAL HIGH (ref 70–99)

## 2021-07-21 LAB — PHOSPHORUS: Phosphorus: 2.9 mg/dL (ref 2.5–4.6)

## 2021-07-21 LAB — MAGNESIUM: Magnesium: 1.6 mg/dL — ABNORMAL LOW (ref 1.7–2.4)

## 2021-07-21 LAB — APTT: aPTT: 63 seconds — ABNORMAL HIGH (ref 24–36)

## 2021-07-21 MED ORDER — MAGNESIUM SULFATE 4 GM/100ML IV SOLN
4.0000 g | Freq: Once | INTRAVENOUS | Status: AC
  Administered 2021-07-21: 4 g via INTRAVENOUS
  Filled 2021-07-21: qty 100

## 2021-07-21 MED ORDER — PREDNISONE 20 MG PO TABS
50.0000 mg | ORAL_TABLET | Freq: Every day | ORAL | Status: DC
  Filled 2021-07-21: qty 3

## 2021-07-21 MED ORDER — NICOTINE 21 MG/24HR TD PT24
21.0000 mg | MEDICATED_PATCH | Freq: Every day | TRANSDERMAL | Status: DC
  Administered 2021-07-21 – 2021-08-01 (×12): 21 mg via TRANSDERMAL
  Filled 2021-07-21 (×13): qty 1

## 2021-07-21 MED ORDER — METOPROLOL TARTRATE 5 MG/5ML IV SOLN
2.5000 mg | INTRAVENOUS | Status: AC | PRN
  Administered 2021-07-21 – 2021-07-22 (×3): 2.5 mg via INTRAVENOUS
  Filled 2021-07-21 (×3): qty 5

## 2021-07-21 MED ORDER — MIDAZOLAM HCL 2 MG/2ML IJ SOLN: 2.0000 mg | Freq: Once | INTRAMUSCULAR | Status: AC

## 2021-07-21 MED ORDER — MIDAZOLAM HCL 2 MG/2ML IJ SOLN
INTRAMUSCULAR | Status: AC
  Administered 2021-07-21: 2 mg via INTRAVENOUS
  Filled 2021-07-21: qty 2

## 2021-07-21 MED ORDER — FUROSEMIDE 10 MG/ML IJ SOLN
40.0000 mg | Freq: Once | INTRAMUSCULAR | Status: AC
Start: 2021-07-21 — End: 2021-07-21
  Administered 2021-07-21: 40 mg via INTRAVENOUS
  Filled 2021-07-21: qty 4

## 2021-07-21 MED ORDER — POTASSIUM CHLORIDE 20 MEQ PO PACK
40.0000 meq | PACK | Freq: Once | ORAL | Status: AC
  Administered 2021-07-21: 40 meq
  Filled 2021-07-21: qty 2

## 2021-07-21 MED ORDER — PREDNISONE 20 MG PO TABS
50.0000 mg | ORAL_TABLET | Freq: Every day | ORAL | Status: DC
  Administered 2021-07-21 – 2021-07-22 (×2): 50 mg
  Filled 2021-07-21 (×2): qty 3

## 2021-07-21 NOTE — Progress Notes (Signed)
[facility] ADULT ICU REPLACEMENT PROTOCOL   The patient does apply for the [facility] Adult ICU Electrolyte Replacment Protocol based on the criteria listed below:   1.Exclusion criteria: TCTS patients, ECMO patients, and Dialysis patients 2. Is GFR >/= 30 ml/min? Yes.    Patient's GFR today is >60 3. Is SCr </= 2? Yes.   Patient's SCr is 0.69 mg/dL 4. Did SCr increase >/= 0.5 in 24 hours? No. 5.Pt's weight >40kg  Yes.   6. Abnormal electrolyte(s):   K 3.7, Mg 1.6  7. Electrolytes replaced per protocol 8.  Call MD STAT for K+ </= 2.5, Phos </= 1, or Mag </= 1 Physician:  V. Myrtice Lauth R Lenox Bink 07/21/2021 6:01 AM

## 2021-07-21 NOTE — Progress Notes (Signed)
°  NEUROSURGERY PROGRESS NOTE   No issues overnight.   EXAM:  BP 105/73    Pulse (!) 144    Temp 97.7 F (36.5 C) (Axillary)    Resp 16    Ht 5\' 10"  (1.778 m)    Wt 60.8 kg    SpO2 94%    BMI 19.23 kg/m   On precedex/ketamine Remains agitated, MAE. ?decreased spontaneous movement RLE Wound c/d/I, neck soft  IMPRESSION:  61 y.o. male s/p ACDF and re-exploration for hematoma. Appears neurologically stable with deconditioning - VDRF - PE - PNA resolved  PLAN: - airway mgmt per PCCM - extubate v tracheostomy - Cont AC for PE - cont supportive care   Consuella Lose, MD [facility]

## 2021-07-21 NOTE — Progress Notes (Signed)
NAME:  Brian [name], MRN:  [id number], DOB:  1960-09-13, LOS: 62 ADMISSION DATE:  06/29/2021, CONSULTATION DATE:  07/03/21 REFERRING MD:  Reinaldo Meeker, NP, CHIEF COMPLAINT:  AMS   History of Present Illness:   61 year old male with prior history of tobacco abuse and ETOH use who recently was found to have critical multilevel cervical spinal stenosis C3-6 with spinal cord compression and spinal cord signal change after recent fall with progressive upper and lower extremity weakness and tremors.  He was admitted on 2/3 and underwent ACDF of C3-4, C4-5, C5-5, and C6-7.  Post-operatively he was progressing slowly with residual weakness in both hands but improving lower extremity strength and function.  He developed some difficulty swallowing on 2/5 and SLP ordered and placed on thickened liquids.   Overnight 2/6, patient with increased difficulty swallowing and was made NPO but also noted to have dysarthria and difficulty moving extremities with numbness.  SLP evaluated and found to be moderate aspiration risk.   Also progressively tachycardic, tachypneic, and now febrile.  Code stroke activated and taken for CT/ CTA head and neck and was given decadron 10mg  once.  NIHSS 18.  CTH was negative for acute findings, and CTA head neck did not reveal any LVO but noted significant prevertebral soft tissue swelling with foci of gas present at the ventral epidural space at C4-5, small collection not excluded.  On 2/7, patient with worsening confusion and concern for airway involvement, PCCM consulted for further evaluation.   Pertinent  Medical History  Tobacco abuse, cervical stenosis, ETOH use   Significant Hospital Events: Including procedures, antibiotic start and stop dates in addition to other pertinent events   2/3 ACDF C3-4, C4-5, C5-5, and C6-7 w/ Dr. Annette Stable 2/6 SLP eval for difficulty swallowing 2/7 Code stroke overnight, neg for LVO, CT showing soft tissue swelling.  PCCM consulted for concern of airway  management and AMS.  Went for evacuation of hematoma  2/18 CT Abd/Pelvis: showing bilateral segmental Pes, started on heparin and showing worsening pneumonia, febrile to  103.  PCT rising, 34.6, restarted on abx 2/19 possible aspiration w/ severe hypoxia; intubated; Switching from heparin to angiomax given subtherapeutic levels despite increase in rate. 2/20 Echo shows normal LV systolic function. There was notation of a + McConnell's sign c/w a large pulmonary embolus which is consistent with the finding of bilateral segmental pulmonary emboli noted on CT 07/14/2021. 2/21 rash appreciated on back; stopped cefepim/vanc switched to zosyn; required low dose levo with increase in fentanyl 2/22 remains intubated; unable to extubate to do increase RR and thick secretions 2/23 [name] infusion added 2/24: weaning fentanyl.   Images  2/7 CT angio head and neck > No large vessel occlusion, hemodynamically significant stenosis, or evidence of dissection. Significant prevertebral soft tissue swelling. Foci of gas are present the ventral epidural space at C4-C5; a small collection is not excluded.  Interim History / Subjective:  Attempted SBT on ketamine and dexmedetomidine yesterday, became tachycardic and desaturated without significant agitation.   Objective   Blood pressure 122/85, pulse 92, temperature 97.7 F (36.5 C), temperature source Oral, resp. rate (!) 22, height 5\' 10"  (1.778 m), weight 60.8 kg, SpO2 100 %.    Vent Mode: PRVC FiO2 (%):  [40 %] 40 % Set Rate:  [20 bmp] 20 bmp Vt Set:  [440 mL] 440 mL PEEP:  [5 cmH20] 5 cmH20 Pressure Support:  [5 cmH20] 5 cmH20 Plateau Pressure:  [14 cmH20-18 cmH20] 14 cmH20   Intake/Output  Summary (Last 24 hours) at 07/21/2021 0723 Last data filed at 07/21/2021 0600 Gross per 24 hour  Intake 2346.61 ml  Output 1940 ml  Net 406.61 ml    Filed Weights   07/20/21 0500 07/20/21 1400 07/21/21 0500  Weight: 59.2 kg 56.1 kg 60.8 kg    Examination: General:  Adult male, intubated and sedated with RASS -4 to -5 HEENT: MM pink/moist; ETT and cortrak in place Neuro: RASS -4, pupils 2 and reactive, minimal movement in extremities with stimulation CV: sinus rhythm on telemetry.  S1/S1, no murmur. No edema. Warm and well perfused, palpable pulses PULM:  Symmetric expansion, small amount of beige secretions.  GI: mild distention, positive bowel sounds Extremities: warm/dry Skin: no rashes or lesions appreciated    Ancillary tests personally reviewed:   Echo 2/20: LVEF 55-60%; D shaped L ventricle (likely volume overload); + McConnell's sign; Severely reduced systolic R function; Moderately enlarged R ventricle - improved on follow up POC echo.  Sputum culture negative x 2 Assessment & Plan:   Acute hypoxic respiratory failure secondary to recurrent aspiration pneumonia Dysphagia  Bilateral segmental PE (new 2/18):  Tachycardia with rare PVC Septic shock  secondary to aspiration pneumonia  Severe cervical spinal stenosis with spinal cord compression s/p ACDF C3-C7 on 2/3 c/b epidural hematoma s/p evacuation 2/7 Hyperactive delirium ETOH abuse Drug Abuse Hx Daily tobacco use  Constipation Normocytic anemia Protein calorie malnutrition Urinary retention  Plan:  - Discussed tracheostomy with son. Awaiting response. Lurline Idol would facilitate weaning by reducing work of breathing, decreased sedation requirements and easier secretion management.  - Continue bronchodilators for COPD. Short steroid course.  - Furosemide today.  - Sputum cultures remain negative. Will stop antibiotics after 7 days.  - Continue tube feedings  - Continue to allow for loose stools as patient needs clean-out - Bivalirudin for small PE. - Continue current sedation regimen until after tracheostomy.  - Nicotine replacement to help with agitation.  Best Practice (right click and "Reselect all SmartList Selections" daily)   Diet/type:  tubefeeds DVT prophylaxis: other angiomax GI prophylaxis: PPI Lines: N/A Foley:  Yes, and it is still needed Code Status:  full code Last date of multidisciplinary goals of care discussion:  Point of contact, son [name]. Discussed trach 2/24 - he will think about it  CRITICAL CARE Performed by: Kipp Brood   Total critical care time: 40 minutes  Critical care time was exclusive of separately billable procedures and treating other patients.  Critical care was necessary to treat or prevent imminent or life-threatening deterioration.  Critical care was time spent personally by me on the following activities: development of treatment plan with patient and/or surrogate as well as nursing, discussions with consultants, evaluation of patient's response to treatment, examination of patient, obtaining history from patient or surrogate, ordering and performing treatments and interventions, ordering and review of laboratory studies, ordering and review of radiographic studies, pulse oximetry, re-evaluation of patient's condition and participation in multidisciplinary rounds.  Kipp Brood, MD [facility] ICU Physician [place]  Pager: [phone] Mobile: [phone] After hours: [phone].

## 2021-07-21 NOTE — Progress Notes (Signed)
Patient's HR sustaining in the 140's. Patient sedated and calm at present. SBT in progress, sats remain stable > 95% RR 19. CCM aware, PRN lopressor added for HR >120.   10:11 AM Lopressor given with good effect, HR 113

## 2021-07-21 NOTE — Progress Notes (Signed)
Pt placed on PS/CPAP 10/5 on 40% and is tolerating well. RT will continue to monitor. °

## 2021-07-21 NOTE — Progress Notes (Signed)
Pt placed back on full support per Dr. Denese Killings. RT will continue to monitor.

## 2021-07-21 NOTE — Progress Notes (Signed)
ANTICOAGULATION CONSULT NOTE  Pharmacy Consult for bivalirudin Indication: pulmonary embolus  No Known Allergies  Patient Measurements: Height: 5\' 10"  (177.8 cm) Weight: 60.8 kg (134 lb 0.6 oz) IBW/kg (Calculated) : 73 Heparin Dosing Weight: TBW  Vital Signs: Temp: 97.7 F (36.5 C) (02/25 0800) Temp Source: Axillary (02/25 0800) BP: 105/73 (02/25 1000) Pulse Rate: 144 (02/25 1000)  Labs: Recent Labs    07/19/21 0623 07/19/21 1437 07/19/21 1507 07/20/21 0341 07/21/21 0330  HGB 7.3*   < > 7.0* 8.6* 8.5*  HCT 21.8*   < > 21.0* 26.7* 26.3*  PLT 364  --   --  364 374  APTT 62*  --   --  58* 63*  CREATININE 0.74  --   --  0.66 0.69   0.69   < > = values in this interval not displayed.    Estimated Creatinine Clearance: 84.4 mL/min (by C-G formula based on SCr of 0.69 mg/dL).   Medical History: History reviewed. No pertinent past medical history.   Assessment: 35 yom s/p ACDF then exploration and evacuation of epidural hematoma. Patient found to have BL PE on 2/18, no righ heart strain. Patient was started on heparin but heparin levels were undetectable x3 so he was changed to bivalirudin on 2/19.  This AM aPTT remains therapeutic at 63 seconds on 0.15 mg/kg/min, H/H and plt stable. Possible tracheostomy soon.  Goal of Therapy:  Goal aPTT 55-80 Monitor platelets by anticoagulation protocol: Yes   Plan:  Continue bivalirudin at 0.15 mg/kg/min Daily aPTT, CBC, s/s bleeding F/u long term anticoagulation plan when no procedures planned  Benetta Spar, PharmD, BCPS, [facility] Clinical Pharmacist  Please check AMION for all [place] phone numbers After 10:00 PM, call [place]

## 2021-07-22 DIAGNOSIS — R739 Hyperglycemia, unspecified: Secondary | ICD-10-CM | POA: Diagnosis not present

## 2021-07-22 DIAGNOSIS — G959 Disease of spinal cord, unspecified: Secondary | ICD-10-CM | POA: Diagnosis not present

## 2021-07-22 DIAGNOSIS — J690 Pneumonitis due to inhalation of food and vomit: Secondary | ICD-10-CM | POA: Diagnosis not present

## 2021-07-22 DIAGNOSIS — G9341 Metabolic encephalopathy: Secondary | ICD-10-CM | POA: Diagnosis not present

## 2021-07-22 LAB — RENAL FUNCTION PANEL
Albumin: <1.5 g/dL — ABNORMAL LOW (ref 3.5–5.0)
Anion gap: 6 (ref 5–15)
BUN: 17 mg/dL (ref 6–20)
CO2: 29 mmol/L (ref 22–32)
Calcium: 8.8 mg/dL — ABNORMAL LOW (ref 8.9–10.3)
Chloride: 103 mmol/L (ref 98–111)
Creatinine, Ser: 0.55 mg/dL — ABNORMAL LOW (ref 0.61–1.24)
GFR, Estimated: >60 mL/min (ref >60)
Glucose, Bld: 131 mg/dL — ABNORMAL HIGH (ref 70–99)
Phosphorus: 3.2 mg/dL (ref 2.5–4.6)
Potassium: 4 mmol/L (ref 3.5–5.1)
Sodium: 138 mmol/L (ref 135–145)

## 2021-07-22 LAB — GLUCOSE, CAPILLARY
Glucose-Capillary: 124 mg/dL — ABNORMAL HIGH (ref 70–99)
Glucose-Capillary: 110 mg/dL — ABNORMAL HIGH (ref 70–99)
Glucose-Capillary: 148 mg/dL — ABNORMAL HIGH (ref 70–99)
Glucose-Capillary: 141 mg/dL — ABNORMAL HIGH (ref 70–99)
Glucose-Capillary: 133 mg/dL — ABNORMAL HIGH (ref 70–99)
Glucose-Capillary: 131 mg/dL — ABNORMAL HIGH (ref 70–99)

## 2021-07-22 LAB — CULTURE, RESPIRATORY W GRAM STAIN
Culture: NO GROWTH
Gram Stain: NONE SEEN

## 2021-07-22 LAB — CBC
HCT: 23.5 % — ABNORMAL LOW (ref 39.0–52.0)
Hemoglobin: 7.9 g/dL — ABNORMAL LOW (ref 13.0–17.0)
MCH: 32.2 pg (ref 26.0–34.0)
MCHC: 33.6 g/dL (ref 30.0–36.0)
MCV: 95.9 fL (ref 80.0–100.0)
Platelets: 360 10*3/uL (ref 150–400)
RBC: 2.45 MIL/uL — ABNORMAL LOW (ref 4.22–5.81)
RDW: 15.8 % — ABNORMAL HIGH (ref 11.5–15.5)
WBC: 13.3 10*3/uL — ABNORMAL HIGH (ref 4.0–10.5)
nRBC: 0 % (ref 0.0–0.2)

## 2021-07-22 LAB — APTT: aPTT: 71 seconds — ABNORMAL HIGH (ref 24–36)

## 2021-07-22 LAB — TRIGLYCERIDES: Triglycerides: 145 mg/dL (ref <150)

## 2021-07-22 MED ORDER — METOPROLOL TARTRATE 5 MG/5ML IV SOLN
2.5000 mg | INTRAVENOUS | Status: DC | PRN
  Administered 2021-07-23 – 2021-07-24 (×3): 2.5 mg via INTRAVENOUS
  Filled 2021-07-22 (×3): qty 5

## 2021-07-22 MED ORDER — FUROSEMIDE 10 MG/ML IJ SOLN
60.0000 mg | Freq: Once | INTRAMUSCULAR | Status: AC
  Administered 2021-07-22: 60 mg via INTRAVENOUS
  Filled 2021-07-22: qty 6

## 2021-07-22 MED ORDER — CLONAZEPAM 0.25 MG PO TBDP
1.0000 mg | ORAL_TABLET | Freq: Two times a day (BID) | ORAL | Status: DC
  Administered 2021-07-22 – 2021-07-23 (×3): 1 mg
  Filled 2021-07-22 (×3): qty 4

## 2021-07-22 NOTE — Progress Notes (Signed)
eLink Physician-Brief Progress Note Patient Name: Brian [name] DOB: [birth year] MRN: [id number]   Date of Service  07/22/2021  HPI/Events of Note  Sinus Tachycardia - HR = 120-125.   eICU Interventions  Plan: Metoprolol 2.5 mg IV Q 4 hours PRN HR > 120. Hold for SBP < 105.     Intervention Category Major Interventions: Arrhythmia - evaluation and management  [name] 07/22/2021, 9:26 PM

## 2021-07-22 NOTE — Progress Notes (Signed)
NAME:  Brian [name], MRN:  [id number], DOB:  28-Apr-1961, LOS: 23 ADMISSION DATE:  06/29/2021, CONSULTATION DATE:  07/03/21 REFERRING MD:  Doran Durand, NP, CHIEF COMPLAINT:  AMS   History of Present Illness:   61 year old male with prior history of tobacco abuse and ETOH use who recently was found to have critical multilevel cervical spinal stenosis C3-6 with spinal cord compression and spinal cord signal change after recent fall with progressive upper and lower extremity weakness and tremors.  He was admitted on 2/3 and underwent ACDF of C3-4, C4-5, C5-5, and C6-7.  Post-operatively he was progressing slowly with residual weakness in both hands but improving lower extremity strength and function.  He developed some difficulty swallowing on 2/5 and SLP ordered and placed on thickened liquids.   Overnight 2/6, patient with increased difficulty swallowing and was made NPO but also noted to have dysarthria and difficulty moving extremities with numbness.  SLP evaluated and found to be moderate aspiration risk.   Also progressively tachycardic, tachypneic, and now febrile.  Code stroke activated and taken for CT/ CTA head and neck and was given decadron 10mg  once.  NIHSS 18.  CTH was negative for acute findings, and CTA head neck did not reveal any LVO but noted significant prevertebral soft tissue swelling with foci of gas present at the ventral epidural space at C4-5, small collection not excluded.  On 2/7, patient with worsening confusion and concern for airway involvement, PCCM consulted for further evaluation.   Pertinent  Medical History  Tobacco abuse, cervical stenosis, ETOH use   Significant Hospital Events: Including procedures, antibiotic start and stop dates in addition to other pertinent events   2/3 ACDF C3-4, C4-5, C5-5, and C6-7 w/ Dr. 4/7 2/6 SLP eval for difficulty swallowing 2/7 Code stroke overnight, neg for LVO, CT showing soft tissue swelling.  PCCM consulted for concern of airway  management and AMS.  Went for evacuation of hematoma  2/18 CT Abd/Pelvis: showing bilateral segmental Pes, started on heparin and showing worsening pneumonia, febrile to  103.  PCT rising, 34.6, restarted on abx 2/19 possible aspiration w/ severe hypoxia; intubated; Switching from heparin to angiomax given subtherapeutic levels despite increase in rate. 2/20 Echo shows normal LV systolic function. There was notation of a + McConnell's sign c/w a large pulmonary embolus which is consistent with the finding of bilateral segmental pulmonary emboli noted on CT 07/14/2021. 2/21 rash appreciated on back; stopped cefepim/vanc switched to zosyn; required low dose levo with increase in fentanyl 2/22 remains intubated; unable to extubate to do increase RR and thick secretions 2/23 [name] infusion added 2/24: weaning fentanyl.   Images  2/7 CT angio head and neck > No large vessel occlusion, hemodynamically significant stenosis, or evidence of dissection. Significant prevertebral soft tissue swelling. Foci of gas are present the ventral epidural space at C4-C5; a small collection is not excluded.  Interim History / Subjective:  Yesterday he tolerated 6 hours of SBT more maintaining good saturations and excellent tidal volumes.  Remained calm with ketamine infusion at 1 mg/kg/h  Objective   Blood pressure 98/65, pulse 84, temperature 99.6 F (37.6 C), temperature source Axillary, resp. rate (!) 9, height 5\' 10"  (1.778 m), weight 61.8 kg, SpO2 99 %.    Vent Mode: PSV;CPAP FiO2 (%):  [40 %] 40 % Set Rate:  [20 bmp] 20 bmp Vt Set:  [440 mL] 440 mL PEEP:  [5 cmH20] 5 cmH20 Pressure Support:  [10 cmH20] 10 cmH20 Plateau Pressure:  [  14 cmH20] 14 cmH20   Intake/Output Summary (Last 24 hours) at 07/22/2021 1228 Last data filed at 07/22/2021 1200 Gross per 24 hour  Intake 3266.1 ml  Output 2660 ml  Net 606.1 ml    Filed Weights   07/20/21 1400 07/21/21 0500 07/22/21 0500  Weight: 56.1 kg 60.8 kg 61.8  kg   Examination: General:  Adult male, intubated and sedated with RASS -3.  Moves all limbs and will periodically follow commands HEENT: MM pink/moist; ETT and cortrak in place Neuro: RASS -3, pupils 2 and reactive, minimal movement in extremities with stimulation CV: sinus rhythm on telemetry.  S1/S1, no murmur. No edema. Warm and well perfused, palpable pulses PULM:  Symmetric expansion, secretions have improved considerably.  Tolerating PSV 10/5 with tidal volumes around 500 GI: mild distention, positive bowel sounds Extremities: warm/dry Skin: no rashes or lesions appreciated    Ancillary tests personally reviewed:   Echo 2/20: LVEF 55-60%; D shaped L ventricle (likely volume overload); + McConnell's sign; Severely reduced systolic R function; Moderately enlarged R ventricle - improved on follow up POC echo.  Sputum culture negative x 2 Creatinine 0.55 Improving leukocytosis at 13.3 Assessment & Plan:   Acute hypoxic respiratory failure secondary to recurrent aspiration pneumonia Dysphagia  Bilateral segmental PE (new 2/18):  Tachycardia with rare PVC Septic shock  secondary to aspiration pneumonia  Severe cervical spinal stenosis with spinal cord compression s/p ACDF C3-C7 on 2/3 c/b epidural hematoma s/p evacuation 2/7 Hyperactive delirium ETOH abuse Drug Abuse Hx Daily tobacco use  Constipation Normocytic anemia Protein calorie malnutrition Urinary retention  Plan:  -Have managed to prove good lung mechanics on SBT with patient appropriate level of sedation. -Cuff leak is present so patient may well tolerate extubation. -Will stop fentanyl and wean sedation further to try for twilight extubation.. - Continue bronchodilators for COPD. Short steroid course.  - Furosemide today.  - Sputum cultures remain negative. Will stop antibiotics after 7 days.  - Continue tube feedings  - Continue to allow for loose stools as patient needs clean-out - Bivalirudin for small  PE. - Nicotine replacement to help with agitation.  Best Practice (right click and "Reselect all SmartList Selections" daily)   Diet/type: tubefeeds DVT prophylaxis: other angiomax GI prophylaxis: PPI Lines: N/A Foley:  Yes, and it is still needed Code Status:  full code Last date of multidisciplinary goals of care discussion:  Point of contact, son [name]. Discussed plan for attempt extubation today.  He is in agreement.  Understands that if he requires reintubation that he would need a tracheostomy at that time  CRITICAL CARE Performed by: Lynnell Catalan   Total critical care time: 40 minutes  Critical care time was exclusive of separately billable procedures and treating other patients.  Critical care was necessary to treat or prevent imminent or life-threatening deterioration.  Critical care was time spent personally by me on the following activities: development of treatment plan with patient and/or surrogate as well as nursing, discussions with consultants, evaluation of patient's response to treatment, examination of patient, obtaining history from patient or surrogate, ordering and performing treatments and interventions, ordering and review of laboratory studies, ordering and review of radiographic studies, pulse oximetry, re-evaluation of patient's condition and participation in multidisciplinary rounds.  Lynnell Catalan, MD [facility] ICU Physician [facility]  Pager: [phone] Mobile: [phone] After hours: [phone].

## 2021-07-22 NOTE — Progress Notes (Signed)
°  NEUROSURGERY PROGRESS NOTE   No issues overnight.   EXAM:  BP 95/63    Pulse 92    Temp 98.8 F (37.1 C) (Axillary)    Resp (!) 9    Ht 5\' 10"  (1.778 m)    Wt 61.8 kg    SpO2 99%    BMI 19.55 kg/m   On precedex/ketamine Remains agitated, MAE. ?decreased spontaneous movement RLE Wound c/d/I, neck soft  IMPRESSION:  62 y.o. male s/p ACDF and re-exploration for hematoma. Remains stable from neurologic standpoint - VDRF - PE - PNA resolved  PLAN: - airway mgmt per PCCM - extubate v tracheostomy - Cont AC for PE - cont supportive care   Consuella Lose, MD [facility]

## 2021-07-22 NOTE — Progress Notes (Signed)
ANTICOAGULATION CONSULT NOTE  Pharmacy Consult for bivalirudin Indication: pulmonary embolus  No Known Allergies  Patient Measurements: Height: 5\' 10"  (177.8 cm) Weight: 61.8 kg (136 lb 3.9 oz) IBW/kg (Calculated) : 73 Heparin Dosing Weight: TBW  Vital Signs: Temp: 98.2 F (36.8 C) (02/26 0409) Temp Source: Axillary (02/26 0409) BP: 97/64 (02/26 0630) Pulse Rate: 99 (02/26 0630)  Labs: Recent Labs    07/20/21 0341 07/21/21 0330 07/22/21 0128  HGB 8.6* 8.5* 7.9*  HCT 26.7* 26.3* 23.5*  PLT 364 374 360  APTT 58* 63* 71*  CREATININE 0.66 0.69   0.69 0.55*    Estimated Creatinine Clearance: 85.8 mL/min (A) (by C-G formula based on SCr of 0.55 mg/dL (L)).   Medical History: History reviewed. No pertinent past medical history.   Assessment: 32 yom s/p ACDF then exploration and evacuation of epidural hematoma. Patient found to have BL PE on 2/18, no right heart strain. Patient was started on heparin but heparin levels were undetectable x3 so he was changed to bivalirudin on 2/19.  This AM aPTT remains therapeutic at 71 seconds on 0.15 mg/kg/min. aPTT has been trending up x3. Previously aPTT went up to 81 on this rate but then came down to 71, will not adjust given stable for 1 week. H/H and plt stable. Possible tracheostomy soon.  Goal of Therapy:  Goal aPTT 55-80 Monitor platelets by anticoagulation protocol: Yes   Plan:  Continue bivalirudin at 0.15 mg/kg/min Daily aPTT, CBC, s/s bleeding F/u long term anticoagulation plan when no procedures planned  Benetta Spar, PharmD, BCPS, [facility] Clinical Pharmacist  Please check AMION for all [place] phone numbers After 10:00 PM, call [place]

## 2021-07-22 NOTE — Procedures (Signed)
Extubation Procedure Note  Patient Details:   Name: Brian [name] DOB: 1960-08-30 MRN: [id number]   Airway Documentation:    Vent end date: 07/22/21 Vent end time: 1450   Evaluation  O2 sats: stable throughout Complications: No apparent complications Patient did tolerate procedure well. Bilateral Breath Sounds: Clear, Diminished   No  Pt extubated to 4L [place] with RN at bedside and is tolerating well. Positive cuff leak noted. Pt did not speak, MD aware. RT will continue to monitor.  Forest Becker 07/22/2021, 2:55 PM

## 2021-07-23 ENCOUNTER — Other Ambulatory Visit (HOSPITAL_COMMUNITY): Payer: Self-pay

## 2021-07-23 DIAGNOSIS — J9601 Acute respiratory failure with hypoxia: Secondary | ICD-10-CM | POA: Diagnosis not present

## 2021-07-23 DIAGNOSIS — G959 Disease of spinal cord, unspecified: Secondary | ICD-10-CM | POA: Diagnosis not present

## 2021-07-23 DIAGNOSIS — G9341 Metabolic encephalopathy: Secondary | ICD-10-CM | POA: Diagnosis not present

## 2021-07-23 DIAGNOSIS — I2699 Other pulmonary embolism without acute cor pulmonale: Secondary | ICD-10-CM | POA: Diagnosis not present

## 2021-07-23 LAB — RENAL FUNCTION PANEL
Albumin: 1.7 g/dL — ABNORMAL LOW (ref 3.5–5.0)
Anion gap: 9 (ref 5–15)
BUN: 15 mg/dL (ref 6–20)
CO2: 31 mmol/L (ref 22–32)
Calcium: 9.2 mg/dL (ref 8.9–10.3)
Chloride: 97 mmol/L — ABNORMAL LOW (ref 98–111)
Creatinine, Ser: 0.62 mg/dL (ref 0.61–1.24)
GFR, Estimated: >60 mL/min (ref >60)
Glucose, Bld: 114 mg/dL — ABNORMAL HIGH (ref 70–99)
Phosphorus: 2.3 mg/dL — ABNORMAL LOW (ref 2.5–4.6)
Potassium: 3.5 mmol/L (ref 3.5–5.1)
Sodium: 137 mmol/L (ref 135–145)

## 2021-07-23 LAB — CBC
HCT: 26.7 % — ABNORMAL LOW (ref 39.0–52.0)
Hemoglobin: 9.1 g/dL — ABNORMAL LOW (ref 13.0–17.0)
MCH: 32 pg (ref 26.0–34.0)
MCHC: 34.1 g/dL (ref 30.0–36.0)
MCV: 94 fL (ref 80.0–100.0)
Platelets: 459 10*3/uL — ABNORMAL HIGH (ref 150–400)
RBC: 2.84 MIL/uL — ABNORMAL LOW (ref 4.22–5.81)
RDW: 15.3 % (ref 11.5–15.5)
WBC: 12.9 10*3/uL — ABNORMAL HIGH (ref 4.0–10.5)
nRBC: 0 % (ref 0.0–0.2)

## 2021-07-23 LAB — GLUCOSE, CAPILLARY
Glucose-Capillary: 110 mg/dL — ABNORMAL HIGH (ref 70–99)
Glucose-Capillary: 115 mg/dL — ABNORMAL HIGH (ref 70–99)
Glucose-Capillary: 133 mg/dL — ABNORMAL HIGH (ref 70–99)
Glucose-Capillary: 132 mg/dL — ABNORMAL HIGH (ref 70–99)
Glucose-Capillary: 137 mg/dL — ABNORMAL HIGH (ref 70–99)

## 2021-07-23 LAB — APTT: aPTT: 62 seconds — ABNORMAL HIGH (ref 24–36)

## 2021-07-23 MED ORDER — CHLORHEXIDINE GLUCONATE 0.12 % MT SOLN
15.0000 mL | Freq: Two times a day (BID) | OROMUCOSAL | Status: DC
  Administered 2021-07-23 – 2021-09-03 (×78): 15 mL via OROMUCOSAL
  Filled 2021-07-23 (×80): qty 15

## 2021-07-23 MED ORDER — PREDNISONE 20 MG PO TABS: 20.0000 mg | ORAL_TABLET | Freq: Every day | ORAL | Status: DC

## 2021-07-23 MED ORDER — DOXAZOSIN MESYLATE 1 MG PO TABS
1.0000 mg | ORAL_TABLET | Freq: Every day | ORAL | Status: DC
  Administered 2021-07-23 – 2021-07-24 (×2): 1 mg
  Filled 2021-07-23 (×3): qty 1

## 2021-07-23 MED ORDER — ORAL CARE MOUTH RINSE
15.0000 mL | Freq: Two times a day (BID) | OROMUCOSAL | Status: DC
  Administered 2021-07-23 – 2021-09-03 (×79): 15 mL via OROMUCOSAL

## 2021-07-23 MED ORDER — POTASSIUM & SODIUM PHOSPHATES 280-160-250 MG PO PACK
1.0000 | PACK | Freq: Three times a day (TID) | ORAL | Status: AC
  Administered 2021-07-23 (×3): 1
  Filled 2021-07-23 (×3): qty 1

## 2021-07-23 MED ORDER — PREDNISONE 20 MG PO TABS
20.0000 mg | ORAL_TABLET | Freq: Every day | ORAL | Status: AC
  Administered 2021-07-23: 20 mg
  Filled 2021-07-23: qty 1

## 2021-07-23 MED ORDER — THIAMINE HCL 100 MG PO TABS
100.0000 mg | ORAL_TABLET | Freq: Every day | ORAL | Status: DC
  Administered 2021-07-23 – 2021-09-22 (×60): 100 mg
  Filled 2021-07-23 (×62): qty 1

## 2021-07-23 MED ORDER — QUETIAPINE FUMARATE 50 MG PO TABS
50.0000 mg | ORAL_TABLET | Freq: Two times a day (BID) | ORAL | Status: DC
  Administered 2021-07-23 – 2021-07-27 (×9): 50 mg
  Filled 2021-07-23 (×2): qty 2
  Filled 2021-07-23: qty 1
  Filled 2021-07-23 (×2): qty 2
  Filled 2021-07-23: qty 1
  Filled 2021-07-23: qty 2
  Filled 2021-07-23 (×2): qty 1

## 2021-07-23 MED ORDER — APIXABAN 5 MG PO TABS
5.0000 mg | ORAL_TABLET | Freq: Two times a day (BID) | ORAL | Status: AC
  Administered 2021-07-23 – 2021-07-27 (×10): 5 mg
  Filled 2021-07-23 (×11): qty 1

## 2021-07-23 MED ORDER — METOPROLOL TARTRATE 5 MG/5ML IV SOLN
5.0000 mg | INTRAVENOUS | Status: AC | PRN
  Administered 2021-07-23 – 2021-07-24 (×3): 5 mg via INTRAVENOUS
  Filled 2021-07-23 (×3): qty 5

## 2021-07-23 NOTE — Plan of Care (Signed)
°  Problem: Clinical Measurements: Goal: Diagnostic test results will improve Outcome: Progressing   Problem: Pain Management: Goal: Pain level will decrease Outcome: Progressing   Problem: Activity: Goal: Risk for activity intolerance will decrease Outcome: Progressing   Problem: Nutrition: Goal: Adequate nutrition will be maintained Outcome: Progressing   Problem: Coping: Goal: Level of anxiety will decrease Outcome: Not Progressing

## 2021-07-23 NOTE — Evaluation (Signed)
Physical Therapy Re-Evaluation Patient Details Name: Brian [name] MRN: [id number] DOB: 12-08-1960 Today's Date: 07/23/2021  History of Present Illness  Pt is a 61 y/o male admitted 06/29/21 following C3-7 ACDF after progressive cervical myelopathy symptoms including ataxia, bil UE and LE shaking and weakness, and loss of function in his hands with subsequent fall. Pt initially had improvement in neurological symptoms, but postoperative course was complicated by dysphasia and ataxia secondary to and epidural hematoma with evacuation of hematoma on 2/7. Course complicated by delirium secondary to encephalopathy, sepsis secondary to aspiration PNA, reintubated 2/19-2/26 for airway protection, bilat segmental PEs 2/18. PMHx includes tobacco and EtOH use disorder.  Clinical Impression   PT reconsulted, pt extubated 2/26 and more medically appropriate for PT caseload at this time. Pt presents with bilat UE/LE weakness R>L, impaired coordination, max difficulty performing bed-level mobility tasks, AMS, impaired sitting balance, and decreased activity tolerance. Pt to benefit from acute PT to address deficits. Pt requiring total +2 assist to move to/from EOB, once sitting pt participatory in balance intervention and UE/LE AAROM. PT to progress mobility as tolerated, and will continue to follow acutely.         Recommendations for follow up therapy are one component of a multi-disciplinary discharge planning process, led by the attending physician.  Recommendations may be updated based on patient status, additional functional criteria and insurance authorization.  Follow Up Recommendations Skilled nursing-short term rehab (<3 hours/day)    Assistance Recommended at Discharge Frequent or constant Supervision/Assistance  Patient can return home with the following  Two people to help with walking and/or transfers;Two people to help with bathing/dressing/bathroom;Direct supervision/assist for medications  management;Direct supervision/assist for financial management;Assist for transportation;Assistance with feeding;Assistance with cooking/housework;Help with stairs or ramp for entrance    Equipment Recommendations Wheelchair (measurements PT);Hospital bed;Wheelchair cushion (measurements PT);BSC/3in1;Other (comment)  Recommendations for Other Services       Functional Status Assessment Patient has had a recent decline in their functional status and/or demonstrates limited ability to make significant improvements in function in a reasonable and predictable amount of time     Precautions / Restrictions Precautions Precautions: Fall;Cervical Required Braces or Orthoses: Cervical Brace Cervical Brace: Soft collar;At all times Restrictions Weight Bearing Restrictions: No      Mobility  Bed Mobility Overal bed mobility: Needs Assistance Bed Mobility: Rolling, Sidelying to Sit, Sit to Supine Rolling: Total assist Sidelying to sit: Total assist, +2 for physical assistance   Sit to supine: Total assist, +2 for safety/equipment   General bed mobility comments: total assist all aspects, once sitting EOB pt able to participate in trunk righting, anterior and posterior leaning with assist of UEs L>R    Transfers                   General transfer comment: unable to attempt    Ambulation/Gait                  Stairs            Wheelchair Mobility    Modified Rankin (Stroke Patients Only)       Balance Overall balance assessment: Needs assistance Sitting-balance support: Feet supported Sitting balance-Leahy Scale: Poor Sitting balance - Comments: at least mod posterior truncal support, preference for posterior and R lateral leaning. Sitting intervention: AP leaning with posterior truncal support and UE use, lateral leaning and righting Postural control: Posterior lean, Right lateral lean  Pertinent  Vitals/Pain Pain Assessment Pain Assessment: Faces Faces Pain Scale: Hurts even more Pain Location: neck, arms, shoulders Pain Descriptors / Indicators: Grimacing, Guarding, Sore Pain Intervention(s): Limited activity within patient's tolerance, Monitored during session, Repositioned    Home Living Family/patient expects to be discharged to:: Private residence Living Arrangements: Alone Available Help at Discharge: Friend(s);Available PRN/intermittently Type of Home: Apartment Home Access: Stairs to enter   Entrance Stairs-Number of Steps: flight   Home Layout: One level Home Equipment: None      Prior Function Prior Level of Function : Independent/Modified Independent;Working/employed;Driving;History of Falls (last six months)             Mobility Comments: reports 2 falls in past 1-2 weeks ADLs Comments: sponge bathing recently due to fear of falling in shower like previous fall     Hand Dominance   Dominant Hand: Right    Extremity/Trunk Assessment   Upper Extremity Assessment Upper Extremity Assessment: Defer to OT evaluation    Lower Extremity Assessment RLE Deficits / Details: foot drop with no active DF/PF; no muscular contraction observed throughout RLE. Full PROM RLE against min resistance in hip and knee flexion. + sustained clonus RLE Coordination: decreased gross motor;decreased fine motor LLE Deficits / Details: + contraction but unsustained DF/PF, knee flexors, knee extensors, hip flexors LLE Coordination: decreased gross motor;decreased fine motor    Cervical / Trunk Assessment Cervical / Trunk Assessment: Neck Surgery  Communication   Communication: No difficulties  Cognition Arousal/Alertness: Awake/alert (with periods of lethargy) Behavior During Therapy: Restless Overall Cognitive Status: Impaired/Different from baseline Area of Impairment: Orientation, Attention, Following commands, Safety/judgement, Problem solving                  Orientation Level: Disoriented to, Time, Place (had surgery, at home) Current Attention Level: Sustained   Following Commands: Follows one step commands with increased time Safety/Judgement: Decreased awareness of deficits, Decreased awareness of safety   Problem Solving: Slow processing, Decreased initiation, Difficulty sequencing, Requires verbal cues, Requires tactile cues General Comments: pt states he is here because he had an operation, then states he is at home. After reoriented to operation, pt states he is at [facility]. Pt unable to state year. Pt increasingly irritable as session wore on, with periods of drowsiness/eye closing at EOB.        General Comments      Exercises     Assessment/Plan    PT Assessment Patient needs continued PT services  PT Problem List Decreased strength;Decreased balance;Decreased activity tolerance;Decreased mobility;Decreased coordination;Decreased knowledge of use of DME;Decreased safety awareness;Decreased knowledge of precautions;Impaired tone;Pain       PT Treatment Interventions DME instruction;Gait training;Functional mobility training;Therapeutic activities;Balance training;Therapeutic exercise;Neuromuscular re-education;Patient/family education    PT Goals (Current goals can be found in the Care Plan section)  Acute Rehab PT Goals Patient Stated Goal: to get better and be independent, back to PLOF PT Goal Formulation: With patient Time For Goal Achievement: 08/06/21 Potential to Achieve Goals: Fair    Frequency Min 3X/week     Co-evaluation PT/OT/SLP Co-Evaluation/Treatment: Yes Reason for Co-Treatment: For patient/therapist safety;To address functional/ADL transfers;Necessary to address cognition/behavior during functional activity;Complexity of the patient's impairments (multi-system involvement) PT goals addressed during session: Mobility/safety with mobility;Balance         AM-PAC PT "6 Clicks" Mobility  Outcome Measure  Help needed turning from your back to your side while in a flat bed without using bedrails?: Total Help needed moving from lying on your back to sitting  on the side of a flat bed without using bedrails?: Total Help needed moving to and from a bed to a chair (including a wheelchair)?: Total Help needed standing up from a chair using your arms (e.g., wheelchair or bedside chair)?: Total Help needed to walk in hospital room?: Total Help needed climbing 3-5 steps with a railing? : Total 6 Click Score: 6    End of Session Equipment Utilized During Treatment: Cervical collar Activity Tolerance: Patient limited by fatigue;Patient limited by pain Patient left: in bed;with nursing/sitter in room;with call bell/phone within reach;with bed alarm set;with restraints reapplied (bilat mitts) Nurse Communication: Mobility status PT Visit Diagnosis: Unsteadiness on feet (R26.81);History of falling (Z91.81);Muscle weakness (generalized) (M62.81);Other symptoms and signs involving the nervous system (R29.898);Difficulty in walking, not elsewhere classified (R26.2);Ataxic gait (R26.0)    Time: [id number] PT Time Calculation (min) (ACUTE ONLY): 28 min   Charges:   PT Evaluation $PT Re-evaluation: 1 Re-eval        Stacie Glaze, PT DPT Acute Rehabilitation Services Pager [phone]  Office [phone]   Guy E Ruffin Pyo 07/23/2021, 4:20 PM

## 2021-07-23 NOTE — TOC Benefit Eligibility Note (Signed)
Patient Advocate Encounter  Prior Authorization for Eliquis 5 mg has been approved.    PA# [id number] Effective dates: 07/23/2021 through 07/23/2022  Patients co-pay is $168.73 due to a $75.00 deductible.     Roland Earl, CPhT Pharmacy Patient Advocate Specialist [facility] Patient Advocate Team Direct Number: [phone]  Fax: [phone]

## 2021-07-23 NOTE — TOC Benefit Eligibility Note (Signed)
Patient Advocate Encounter  Insurance verification completed.    The patient is currently admitted and upon discharge could be taking Eliquis 5 mg.  Requires Prior Authorization  The patient is insured through [employer]    Lateef Juncaj, CPhT Pharmacy Patient Advocate Specialist [facility] Patient Advocate Team Direct Number: [phone]  Fax: [phone]        

## 2021-07-23 NOTE — Progress Notes (Signed)
Patient tolerating extubation well.  Breathing easily with minimal O2 requirements.  Still remains confused which is likely medication and stress effect.  He is afebrile.  He remains moderately tachycardic blood pressure and respiratory rate are acceptable.  Urine output remains good.  He is awake and aware.  He is oriented to person.  He will follow simple commands bilaterally.  His motor and sensory function appear stable.  His wound is clean dry and intact.  Progressing much better with regard to his pulmonary status.  Neurologically he is about the same.  Continue supportive efforts.  Reinstitute therapy.

## 2021-07-23 NOTE — Progress Notes (Addendum)
ANTICOAGULATION CONSULT NOTE  Pharmacy Consult for bivalirudin Indication: pulmonary embolus  No Known Allergies  Patient Measurements: Height: 5\' 10"  (177.8 cm) Weight: 57.8 kg (127 lb 6.8 oz) IBW/kg (Calculated) : 73 Heparin Dosing Weight: TBW  Vital Signs: Temp: 98.4 F (36.9 C) (02/27 0000) Temp Source: Oral (02/27 0000) BP: 149/99 (02/27 0700) Pulse Rate: 116 (02/27 0700)  Labs: Recent Labs    07/21/21 0330 07/22/21 0128 07/23/21 0451  HGB 8.5* 7.9* 9.1*  HCT 26.3* 23.5* 26.7*  PLT 374 360 459*  APTT 63* 71* 62*  CREATININE 0.69   0.69 0.55* 0.62     Estimated Creatinine Clearance: 80.3 mL/min (by C-G formula based on SCr of 0.62 mg/dL).   Medical History: History reviewed. No pertinent past medical history.   Assessment: 37 yom s/p ACDF then exploration and evacuation of epidural hematoma. Patient found to have BL PE on 2/18, no right heart strain. Patient was started on heparin but heparin levels were undetectable x3 so he was changed to bivalirudin on 2/19.  aPTT remains therapeutic at 62 seconds on 0.15 mg/kg/min. H/H and plt high. No bleed issues reported.  Goal of Therapy:  Goal aPTT 55-80 Monitor platelets by anticoagulation protocol: Yes   Plan:  Continue bivalirudin at 0.15 mg/kg/min Daily aPTT, CBC, s/s bleeding F/u long term anticoagulation plan when no procedures planned   Arturo Morton, PharmD, BCPS Please check AMION for all [place] contact numbers Clinical Pharmacist 07/23/2021 7:41 AM   ADDENDUM - Patient to transition to apixaban per CCM discussion with Neurosurgery [facility]). Will forgo loading apixaban due to patient having been on therapeutic bivalirudin > 1 week. CBC stable, SCr WNL. No bleed issues reported.  Plan: D/c bivalirudin infusion at time of 1st dose of apixaban 5mg  PT BID - communicated plan with RN Monitor CBC, s/sx bleeding   Arturo Morton, PharmD, BCPS Please check AMION for all [place] contact  numbers Clinical Pharmacist 07/23/2021 11:15 AM

## 2021-07-23 NOTE — Progress Notes (Addendum)
Occupational Therapy Treatment Patient Details Name: Brian [name] MRN: [id number] DOB: [birth year] Today's Date: 07/23/2021   History of present illness Pt is a 61 y/o male admitted 06/29/21 following C3-7 ACDF after progressive cervical myelopathy symptoms including ataxia, bil UE and LE shaking and weakness, and loss of function in his hands with subsequent fall. Pt initially had improvement in neurological symptoms, but postoperative course was complicated by dysphasia and ataxia secondary to and epidural hematoma with evacuation of hematoma on 2/7. Course complicated by delirium secondary to encephalopathy, sepsis secondary to aspiration PNA, reintubated 2/19-2/26 for airway protection, bilat segmental PEs 2/18. PMHx includes tobacco and EtOH use disorder.   OT comments  This 61 yo male admitted and underwent above seen in conjunction with PT today to work on bed mobility, sitting EOB balance, and use of Bil UEs. Pt's vitals remained stable throughout session with pt not c/o of dizziness. He has minimal movement in Bil UEs without any tenodesis possible grasp currently noted--will consider splinting options for hands. He will continue to benefit from acute OT with follow up at SNF.    Recommendations for follow up therapy are one component of a multi-disciplinary discharge planning process, led by the attending physician.  Recommendations may be updated based on patient status, additional functional criteria and insurance authorization.    Follow Up Recommendations  Skilled nursing-short term rehab (<3 hours/day)    Assistance Recommended at Discharge Frequent or constant Supervision/Assistance  Patient can return home with the following  Two people to help with walking and/or transfers;Direct supervision/assist for medications management;Assist for transportation;Help with stairs or ramp for entrance;Assistance with feeding;Two people to help with bathing/dressing/bathroom;Assistance with  cooking/housework;Direct supervision/assist for Chartered loss adjuster (measurements OT);Wheelchair cushion (measurements OT);Hospital bed;BSC/3in1;Tub/shower seat       Precautions / Restrictions Precautions Precautions: Fall;Cervical Required Braces or Orthoses: Cervical Brace Cervical Brace: Soft collar;At all times Restrictions Weight Bearing Restrictions: No       Mobility Bed Mobility Overal bed mobility: Needs Assistance Bed Mobility: Rolling, Sidelying to Sit, Sit to Supine Rolling: Total assist Sidelying to sit: Total assist, +2 for physical assistance   Sit to supine: Total assist, +2 for safety/equipment   General bed mobility comments: total assist all aspects, once sitting EOB pt able to participate in trunk righting, anterior and posterior leaning with assist of UEs L>R       Balance Overall balance assessment: Needs assistance Sitting-balance support: Feet supported Sitting balance-Leahy Scale: Poor Sitting balance - Comments: at least mod posterior truncal support, preference for posterior and R lateral leaning. Sitting intervention: AP leaning with posterior truncal support and UE use, lateral leaning and righting Postural control: Posterior lean, Right lateral lean                                    Extremity/Trunk Assessment Upper Extremity Assessment Upper Extremity Assessment: Generalized weakness;RUE deficits/detail;LUE deficits/detail RUE Deficits / Details: Can supinate and start to bring hand to mouth, trace shoulder flexion and abduction, 2/5 shoulder retraction and adduction, no movement in wrist or fingers noted LUE Deficits / Details: Can supinate and start to bring hand to mouth, trace shoulder flexion and abduction, 2/5 shoulder retraction and adduction, no movement in wrist , trace movement in fingers for flexion   Lower Extremity Assessment RLE Deficits / Details: foot drop with no active  DF/PF; no muscular contraction observed throughout RLE.  Full PROM RLE against min resistance in hip and knee flexion. + sustained clonus RLE Coordination: decreased gross motor;decreased fine motor LLE Deficits / Details: + contraction but unsustained DF/PF, knee flexors, knee extensors, hip flexors LLE Coordination: decreased gross motor;decreased fine motor   Cervical / Trunk Assessment Cervical / Trunk Assessment: Neck Surgery    Vision Baseline Vision/History: 0 No visual deficits Patient Visual Report: No change from baseline            Cognition Arousal/Alertness: Awake/alert (with periods of lethargy) Behavior During Therapy: Restless Overall Cognitive Status: Impaired/Different from baseline Area of Impairment: Orientation                 Orientation Level:  (had surgery, at home)                                 Pertinent Vitals/ Pain       Pain Assessment Pain Assessment: No/denies pain  Home Living Family/patient expects to be discharged to:: Private residence Living Arrangements: Alone Available Help at Discharge: Friend(s);Available PRN/intermittently Type of Home: Apartment Home Access: Stairs to enter Entrance Stairs-Number of Steps: flight   Home Layout: One level     Bathroom Shower/Tub: Tub/shower unit (sponge bathing recently)   Biochemist, clinical: Standard     Home Equipment: None              Frequency  Min 2X/week        Progress Toward Goals  OT Goals(current goals can now be found in the care plan section)  Progress towards OT goals: Not progressing toward goals - comment (has had medical set backs since last seen)  Acute Rehab OT Goals Patient Stated Goal: to get out of here OT Goal Formulation: With patient Time For Goal Achievement: 08/06/21 Potential to Achieve Goals: [place] Discharge plan remains appropriate    Co-evaluation    PT/OT/SLP Co-Evaluation/Treatment: Yes Reason for Co-Treatment: For  patient/therapist safety;To address functional/ADL transfers;Necessary to address cognition/behavior during functional activity;Complexity of the patient's impairments (multi-system involvement) PT goals addressed during session: Mobility/safety with mobility;Balance OT goals addressed during session: Strengthening/ROM      AM-PAC OT "6 Clicks" Daily Activity     Outcome Measure   Help from another person eating meals?: Total Help from another person taking care of personal grooming?: Total Help from another person toileting, which includes using toliet, bedpan, or urinal?: Total Help from another person bathing (including washing, rinsing, drying)?: Total Help from another person to put on and taking off regular upper body clothing?: Total Help from another person to put on and taking off regular lower body clothing?: Total 6 Click Score: 6    End of Session Equipment Utilized During Treatment: Cervical collar  OT Visit Diagnosis: Other abnormalities of gait and mobility (R26.89);Muscle weakness (generalized) (M62.81);Feeding difficulties (R63.3)   Activity Tolerance Patient tolerated treatment well   Patient Left in bed;with call bell/phone within reach;with bed alarm set           Time: 1417-1445 OT Time Calculation (min): 28 min  Charges: OT General Charges $OT Visit: 1 Visit OT Treatments $Therapeutic Activity: 8-22 mins  [address], OTR/L Acute [employer] Pager [phone] Office [phone]    Almon Register 07/23/2021, 4:34 PM

## 2021-07-23 NOTE — Progress Notes (Signed)
NAME:  Brian [name], MRN:  [id number], DOB:  19-Jun-1960, LOS: 24 ADMISSION DATE:  06/29/2021, CONSULTATION DATE:  2/7 REFERRING MD:  Reinaldo Meeker, CHIEF COMPLAINT:  Confusion   History of Present Illness:  61 year old male with prior history of tobacco abuse and ETOH use who recently was found to have critical multilevel cervical spinal stenosis C3-6 with spinal cord compression and spinal cord signal change after recent fall with progressive upper and lower extremity weakness and tremors.  He was admitted on 2/3 and underwent ACDF of C3-4, C4-5, C5-5, and C6-7.  Post-operatively he was progressing slowly with residual weakness in both hands but improving lower extremity strength and function.  He developed some difficulty swallowing on 2/5 and SLP ordered and placed on thickened liquids.   Overnight 2/6, patient with increased difficulty swallowing and was made NPO but also noted to have dysarthria and difficulty moving extremities with numbness.  SLP evaluated and found to be moderate aspiration risk.   Also progressively tachycardic, tachypneic, and now febrile.  Code stroke activated and taken for CT/ CTA head and neck and was given decadron 10mg  once.  NIHSS 18.  CTH was negative for acute findings, and CTA head neck did not reveal any LVO but noted significant prevertebral soft tissue swelling with foci of gas present at the ventral epidural space at C4-5, small collection not excluded.  On 2/7, patient with worsening confusion and concern for airway involvement, PCCM consulted for further evaluation.   Pertinent  Medical History  Tobacco abuse, cervical stenosis, ETOH use  Significant Hospital Events: Including procedures, antibiotic start and stop dates in addition to other pertinent events   2/3 ACDF C3-4, C4-5, C5-5, and C6-7 w/ Dr. Annette Stable 2/6 SLP eval for difficulty swallowing 2/7 Code stroke overnight, neg for LVO, CT showing soft tissue swelling.  PCCM consulted for concern of airway  management and AMS.  Went for evacuation of hematoma  2/18 CT Abd/Pelvis: showing bilateral segmental Pes, started on heparin and showing worsening pneumonia, febrile to  103.  PCT rising, 34.6, restarted on abx 2/19 possible aspiration w/ severe hypoxia; intubated; Switching from heparin to angiomax given subtherapeutic levels despite increase in rate. 2/20 Echo shows normal LV systolic function. There was notation of a + McConnell's sign c/w a large pulmonary embolus which is consistent with the finding of bilateral segmental pulmonary emboli noted on CT 07/14/2021. 2/21 rash appreciated on back; stopped cefepim/vanc switched to zosyn; required low dose levo with increase in fentanyl 2/22 remains intubated; unable to extubate to do increase RR and thick secretions 2/23 [name] infusion added 2/24: weaning fentanyl.  2/26 extubated  Images  2/7 CT angio head and neck > No large vessel occlusion, hemodynamically significant stenosis, or evidence of dissection. Significant prevertebral soft tissue swelling. Foci of gas are present the ventral epidural space at C4-C5; a small collection is not excluded.  Abx: 2/18 Cefepime > 2/20 2/18 Flagyl 2/19  2/18 vanc > 2/21 2/21 Zosyn > 2/22   Interim History / Subjective:  Extubated Some agitation overnight Remains on precedex   Objective   Blood pressure (!) 149/99, pulse (!) 116, temperature 98.4 F (36.9 C), temperature source Oral, resp. rate (!) 29, height 5\' 10"  (1.778 m), weight 57.8 kg, SpO2 93 %.    Vent Mode: PSV;CPAP FiO2 (%):  [40 %] 40 % PEEP:  [5 cmH20] 5 cmH20   Intake/Output Summary (Last 24 hours) at 07/23/2021 0805 Last data filed at 07/23/2021 0700 Gross per 24  hour  Intake 2086.05 ml  Output 4950 ml  Net -2863.95 ml   Filed Weights   07/21/21 0500 07/22/21 0500 07/23/21 0409  Weight: 60.8 kg 61.8 kg 57.8 kg    Examination:  General:  Resting comfortably in bed HENT: NCAT OP clear soft collar in place, upper lip  swelling, cor trak in place PULM: CTA B, normal effort CV: RRR, no mgr GI: BS+, soft, nontender MSK: normal bulk and tone Neuro: awake, alert, speaking but confused, [facility] Problem list   Septic shock secondary to aspiration pneumonia  Assessment & Plan:  Acute respiratory failure with hypoxemia in setting of aspiration pneumonia Wean off O2 for O2 saturation > 88% Aspiration precautions Stop antibiotics at 7 days  Bilateral segmental PE Tachycardia with rare PVC Bivalrudin for small PE > check with NSGY, can switch to doac once OK from their perspective  Severe cervical spinal stenosis with cord compression, s/p ACDF [id number] on 2/3, complicated by epidural hematoma s/p evacuation 2/7 Hyperactive delirium> prednisone making this worse EtOH abuse at baseline Start seroquel Decrease prednisone Wean off precedex  EtOH abuse Drug abuse Tobacco abuse Counsel to quit  Constipation Protein calorie malnutrition, moderate Dysphagia Tube feeding, stool softeners  Normocytic anemia Monitor for bleeding Transfuse PRBC for Hgb < 7 gm/dL  Urinary retention Attempt to remove foley today  Best Practice (right click and "Reselect all SmartList Selections" daily)   Diet/type: tubefeeds DVT prophylaxis: other bivalrudin GI prophylaxis: N/A Lines: N/A Foley:  Yes, and it is no longer needed Code Status:  full code Last date of multidisciplinary goals of care discussion [2/27 tried to call son [name], no answer]  Labs   CBC: Recent Labs  Lab 07/16/21 1548 07/17/21 0147 07/17/21 1641 07/19/21 0623 07/19/21 1437 07/19/21 1507 07/20/21 0341 07/21/21 0330 07/22/21 0128 07/23/21 0451  WBC 25.9* 20.9*   < > 17.4*  --   --  14.3* 13.7* 13.3* 12.9*  NEUTROABS 23.3* 17.9*  --   --   --   --   --   --   --   --   HGB 8.3* 7.8*   < > 7.3*   < > 7.0* 8.6* 8.5* 7.9* 9.1*  HCT 23.9* 22.7*   < > 21.8*   < > 21.0* 26.7* 26.3* 23.5* 26.7*  MCV 97.6 97.4   < > 100.0   --   --  97.8 97.4 95.9 94.0  PLT 389 374   < > 364  --   --  364 374 360 459*   < > = values in this interval not displayed.    Basic Metabolic Panel: Recent Labs  Lab 07/17/21 0147 07/17/21 1434 07/18/21 0643 07/19/21 0623 07/19/21 1437 07/20/21 0341 07/21/21 0330 07/22/21 0128 07/23/21 0451  NA 136   < > 140   141 140 139 140 141   141 138 137  K 3.6   < > 3.9   3.9 3.7 4.0 3.8 3.7   3.7 4.0 3.5  CL 107   < > 110   110 108  --  108 105   105 103 97*  CO2 21*   < > 23   23 25   --  27 30   29 29 31   GLUCOSE 136*   < > 117*   115* 96  --  139* 121*   120* 131* 114*  BUN 26*   < > 22*   22* 16  --  12 14  12 17 15   CREATININE 0.95   < > 0.76   0.77 0.74  --  0.66 0.69   0.69 0.55* 0.62  CALCIUM 8.6*   < > 8.6*   8.6* 8.8*  --  9.0 9.0   8.9 8.8* 9.2  MG 1.9  --  1.9 1.7  --   --  1.6*  --   --   PHOS 1.8*  --  3.5 2.7  --  2.3* 2.9   3.0 3.2 2.3*   < > = values in this interval not displayed.   GFR: Estimated Creatinine Clearance: 80.3 mL/min (by C-G formula based on SCr of 0.62 mg/dL). Recent Labs  Lab 07/16/21 1012 07/16/21 1548 07/17/21 0147 07/18/21 [id number] 07/19/21 0623 07/20/21 0341 07/21/21 0330 07/22/21 0128 07/23/21 0451  PROCALCITON  --   --  25.09 9.19  --   --   --   --   --   WBC  --    < > 20.9* 17.8*   < > 14.3* 13.7* 13.3* 12.9*  LATICACIDVEN 1.3  --  1.4  --   --   --   --   --   --    < > = values in this interval not displayed.    Liver Function Tests: Recent Labs  Lab 07/17/21 0147 07/18/21 [id number] 07/19/21 0623 07/20/21 0341 07/21/21 0330 07/22/21 0128 07/23/21 0451  AST 32  --   --   --   --   --   --   ALT 24  --   --   --   --   --   --   ALKPHOS 113  --   --   --   --   --   --   BILITOT 1.3*  --   --   --   --   --   --   PROT 6.1*  --   --   --   --   --   --   ALBUMIN 1.5*   < > <1.5* <1.5* <1.5* <1.5* 1.7*   < > = values in this interval not displayed.   No results for input(s): LIPASE, AMYLASE in the last 168 hours. No results  for input(s): AMMONIA in the last 168 hours.  ABG    Component Value Date/Time   PHART 7.450 07/19/2021 1437   PCO2ART 34.6 07/19/2021 1437   PO2ART 51 (L) 07/19/2021 1437   HCO3 24.1 07/19/2021 1437   TCO2 25 07/19/2021 1437   ACIDBASEDEF 2.0 07/17/2021 1641   O2SAT 88 07/19/2021 1437     Coagulation Profile: No results for input(s): INR, PROTIME in the last 168 hours.  Cardiac Enzymes: No results for input(s): CKTOTAL, CKMB, CKMBINDEX, TROPONINI in the last 168 hours.  HbA1C: Hgb A1c MFr Bld  Date/Time Value Ref Range Status  07/05/2021 03:39 AM 5.2 4.8 - 5.6 % Final    Comment:    (NOTE) Pre diabetes:          5.7%-6.4%  Diabetes:              >6.4%  Glycemic control for   <7.0% adults with diabetes     CBG: Recent Labs  Lab 07/22/21 1534 07/22/21 1952 07/22/21 2326 07/23/21 0407 07/23/21 0750  GLUCAP 141* 133* 131* 110* 115*    Critical care time: n/a    Roselie Awkward, MD [place] [facility] Pager: [phone] Cell: [phone] After 7:00 pm call Elink  [phone]

## 2021-07-23 NOTE — Progress Notes (Signed)
SLP Cancellation Note  Patient Details Name: QUINNTON BURY MRN: [id number] DOB: 05/20/1961   Cancelled treatment:       Reason Eval/Treat Not Completed: Fatigue/lethargy limiting ability to participate. Pt still too drowsy to recognize ice chips. Will f/u tomorrow for Po trials to determine readiness for eventual MBS.    Antar Milks, Riley Nearing 07/23/2021, 11:38 AM

## 2021-07-23 NOTE — Progress Notes (Signed)
75 ml of Ketamine was wasted by this RN and witnessed by Beverlee Nims, Therapist, sports.

## 2021-07-23 NOTE — Progress Notes (Signed)
Nutrition Follow-up  DOCUMENTATION CODES:   Severe malnutrition in context of social or environmental circumstances  INTERVENTION:   Tube Feeding via Cortrak (post pyloric):  Vital 1.5 at 50 ml/hr Pro-Source TF 45 mL TID This provides 1920 kcals, 114 g of protein and 912 mL of free water  NUTRITION DIAGNOSIS:   Severe Malnutrition related to social / environmental circumstances as evidenced by severe muscle depletion, severe fat depletion.  Being addressed via TF   GOAL:   Patient will meet greater than or equal to 90% of their needs  Progressing  MONITOR:   TF tolerance  REASON FOR ASSESSMENT:   Consult Assessment of nutrition requirement/status, Enteral/tube feeding initiation and management  ASSESSMENT:   Pt with hx EtOH abuse, tobacco use, and spinal stenosis initially presented 2/3 for planned multilevel anterior cervical decompression and fusion surgery after experiencing progressive bilateral upper and lower extremity weakness and spasticity due to critical multilevel cervical spinal stenosis with spinal cord compression and signal change.  02/03 - s/p anterior cervical discectomy with interbody fusion  02/05 - pt initially complained of worsening swallowing function 02/06 - MBS, NPO per SLP 02/07 - Code Stroke activated (negative) but transferred to ICU, s/p re-exploration anterior cervical fusion with evacuation of epidural hematoma 02/08 - Cortrak tube placed (tip gastric), tube feeds initiated 02/10 - MBS, diet advanced to dysphagia 2 with nectar-thick liquids, Cortrak removed 02/11 - NPO 02/13 - diet advanced to dysphagia 2 with nectar-thick liquids 02/16 - diet advanced to dysphagia 2 with thin liquids 02/19- Intubated after desaturation, possibly from PO intake 02/20 - s/p cortrak tube; post pyloric  02/22 - TF off, golytely given 02/23 - restarted and advancing TF 02/26 - Extubated  NPO, SLP consulted, pt too lethargic for trials of po  Vital 1.5  infusing at rate of 50 ml/hr via Cortrak  +stool via rectal tube  Current wt: 57.8 kg  Labs: reviewed Meds: MVI, thiamine  Diet Order:   Diet Order             Diet NPO time specified  Diet effective now                   EDUCATION NEEDS:   Education needs have been addressed  Skin:  Skin Assessment: Skin Integrity Issues: Skin Integrity Issues:: Incisions Incisions: closed neck  Last BM:  2/27 rectal tube   Height:   Ht Readings from Last 1 Encounters:  07/15/21 5\' 10"  (1.778 m)    Weight:   Wt Readings from Last 1 Encounters:  07/23/21 57.8 kg    Ideal Body Weight:  78.2 kg  BMI:  Body mass index is 18.28 kg/m.  Estimated Nutritional Needs:   Kcal:  1800-2000  Protein:  100-115 grams  Fluid:  >1.8 L/day    Kerman Passey MS, RDN, LDN, CNSC Registered Dietitian III Clinical Nutrition RD Pager and On-Call Pager Number Located in [place]

## 2021-07-24 ENCOUNTER — Inpatient Hospital Stay (HOSPITAL_COMMUNITY): Payer: 59

## 2021-07-24 DIAGNOSIS — I2699 Other pulmonary embolism without acute cor pulmonale: Secondary | ICD-10-CM | POA: Diagnosis not present

## 2021-07-24 DIAGNOSIS — G959 Disease of spinal cord, unspecified: Secondary | ICD-10-CM | POA: Diagnosis not present

## 2021-07-24 DIAGNOSIS — G9341 Metabolic encephalopathy: Secondary | ICD-10-CM | POA: Diagnosis not present

## 2021-07-24 LAB — CBC WITH DIFFERENTIAL/PLATELET
Abs Immature Granulocytes: 0.11 10*3/uL — ABNORMAL HIGH (ref 0.00–0.07)
Basophils Absolute: 0 10*3/uL (ref 0.0–0.1)
Basophils Relative: 0 %
Eosinophils Absolute: 0.1 10*3/uL (ref 0.0–0.5)
Eosinophils Relative: 1 %
HCT: 29.2 % — ABNORMAL LOW (ref 39.0–52.0)
Hemoglobin: 9.6 g/dL — ABNORMAL LOW (ref 13.0–17.0)
Immature Granulocytes: 1 %
Lymphocytes Relative: 25 %
Lymphs Abs: 3.5 10*3/uL (ref 0.7–4.0)
MCH: 31.3 pg (ref 26.0–34.0)
MCHC: 32.9 g/dL (ref 30.0–36.0)
MCV: 95.1 fL (ref 80.0–100.0)
Monocytes Absolute: 1.3 10*3/uL — ABNORMAL HIGH (ref 0.1–1.0)
Monocytes Relative: 10 %
Neutro Abs: 8.7 10*3/uL — ABNORMAL HIGH (ref 1.7–7.7)
Neutrophils Relative %: 63 %
Platelets: 567 10*3/uL — ABNORMAL HIGH (ref 150–400)
RBC: 3.07 MIL/uL — ABNORMAL LOW (ref 4.22–5.81)
RDW: 15.5 % (ref 11.5–15.5)
WBC: 13.8 10*3/uL — ABNORMAL HIGH (ref 4.0–10.5)
nRBC: 0 % (ref 0.0–0.2)

## 2021-07-24 LAB — COMPREHENSIVE METABOLIC PANEL
ALT: 93 U/L — ABNORMAL HIGH (ref 0–44)
AST: 116 U/L — ABNORMAL HIGH (ref 15–41)
Albumin: 2 g/dL — ABNORMAL LOW (ref 3.5–5.0)
Alkaline Phosphatase: 349 U/L — ABNORMAL HIGH (ref 38–126)
Anion gap: 9 (ref 5–15)
BUN: 19 mg/dL (ref 6–20)
CO2: 29 mmol/L (ref 22–32)
Calcium: 9.3 mg/dL (ref 8.9–10.3)
Chloride: 100 mmol/L (ref 98–111)
Creatinine, Ser: 0.64 mg/dL (ref 0.61–1.24)
GFR, Estimated: >60 mL/min (ref >60)
Glucose, Bld: 102 mg/dL — ABNORMAL HIGH (ref 70–99)
Potassium: 3.5 mmol/L (ref 3.5–5.1)
Sodium: 138 mmol/L (ref 135–145)
Total Bilirubin: 0.5 mg/dL (ref 0.3–1.2)
Total Protein: 7.9 g/dL (ref 6.5–8.1)

## 2021-07-24 LAB — AMMONIA: Ammonia: 26 umol/L (ref 9–35)

## 2021-07-24 LAB — GLUCOSE, CAPILLARY
Glucose-Capillary: 114 mg/dL — ABNORMAL HIGH (ref 70–99)
Glucose-Capillary: 115 mg/dL — ABNORMAL HIGH (ref 70–99)
Glucose-Capillary: 119 mg/dL — ABNORMAL HIGH (ref 70–99)
Glucose-Capillary: 121 mg/dL — ABNORMAL HIGH (ref 70–99)
Glucose-Capillary: 122 mg/dL — ABNORMAL HIGH (ref 70–99)
Glucose-Capillary: 133 mg/dL — ABNORMAL HIGH (ref 70–99)

## 2021-07-24 LAB — HEPATITIS PANEL, ACUTE
HCV Ab: NONREACTIVE
Hep A IgM: NONREACTIVE
Hep B C IgM: NONREACTIVE
Hepatitis B Surface Ag: NONREACTIVE

## 2021-07-24 LAB — PHOSPHORUS: Phosphorus: 2.6 mg/dL (ref 2.5–4.6)

## 2021-07-24 IMAGING — US US ABDOMEN LIMITED
1 series · 14 of 25 positions shown · non-contrast
Comparison: None.

CLINICAL DATA: Abnormal liver function tests.

EXAM:
ULTRASOUND ABDOMEN LIMITED RIGHT UPPER QUADRANT

[Series 1: us abdomen limited ruq (liver/gb) · 14 of 38 slices shown]
[im 1/38]
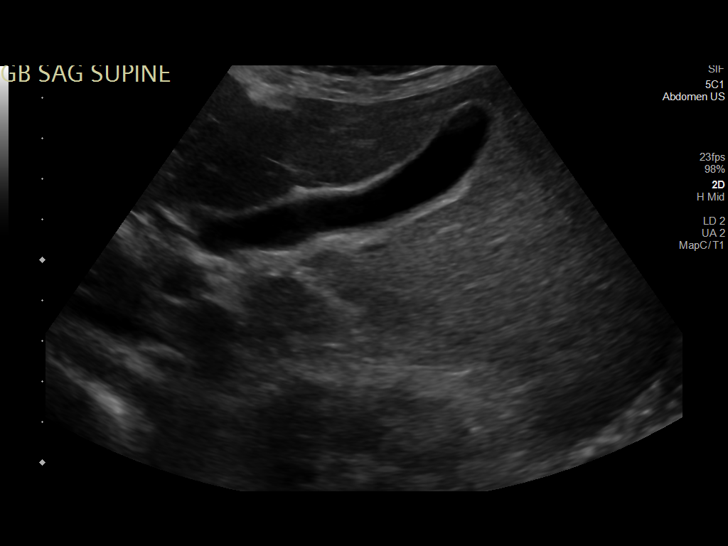
[im 4/38]
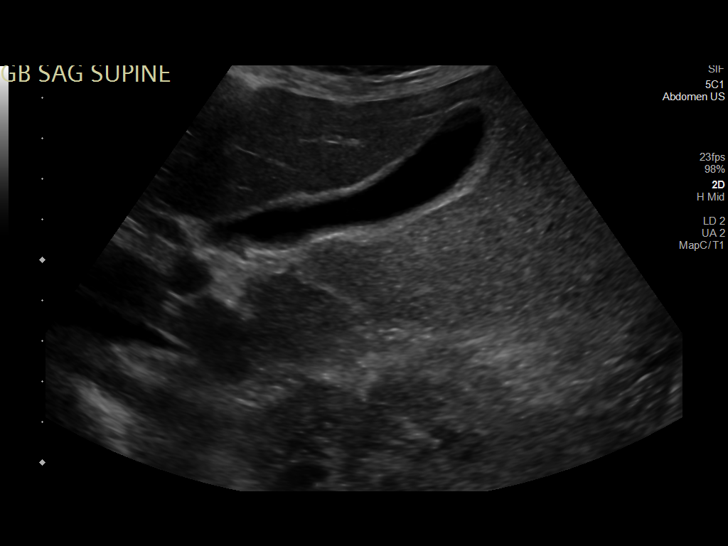
[im 7/38]
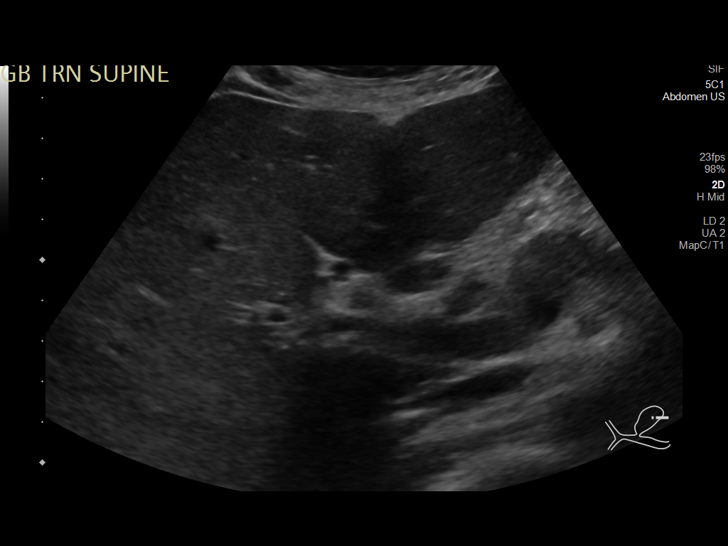
[im 10/38]
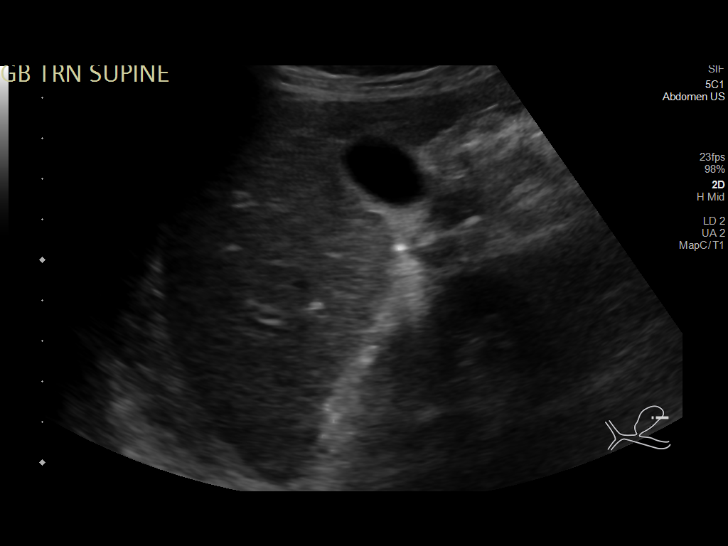
[im 13/38]
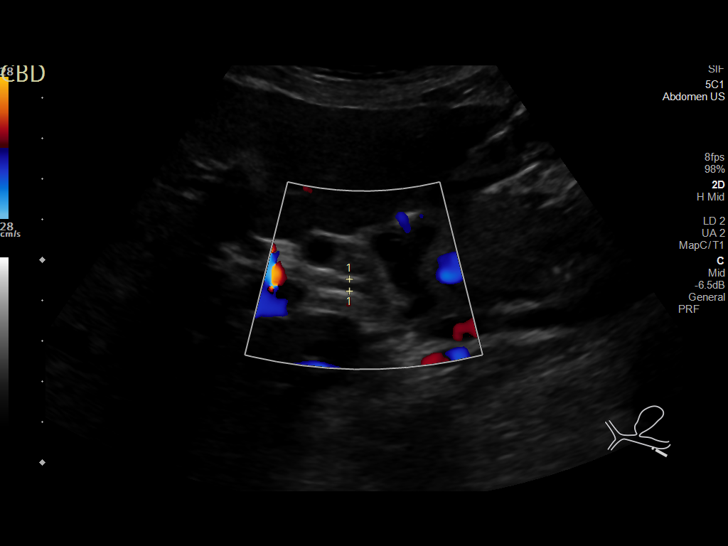
[im 14/38]
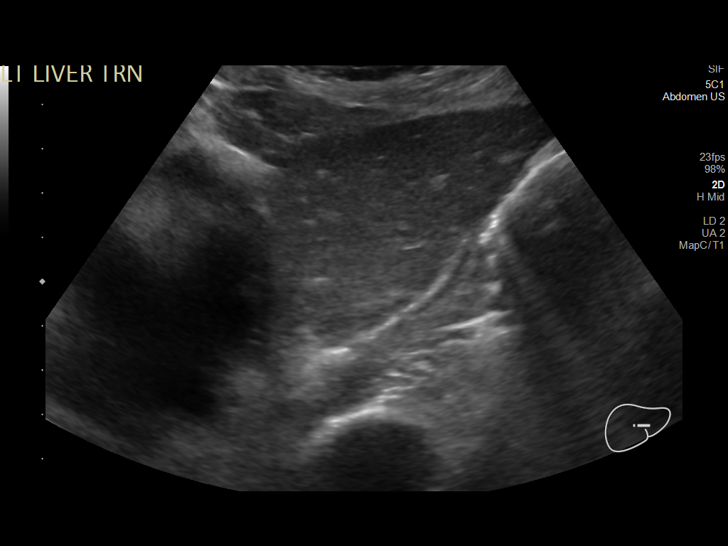
[im 17/38]
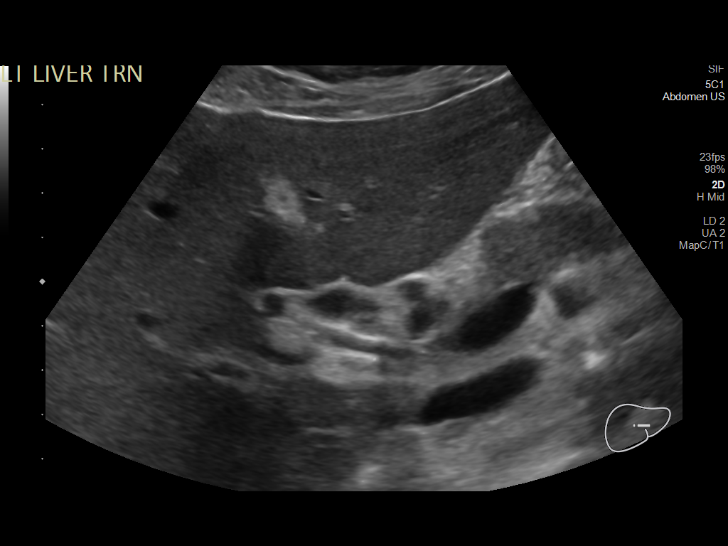
[im 21/38]
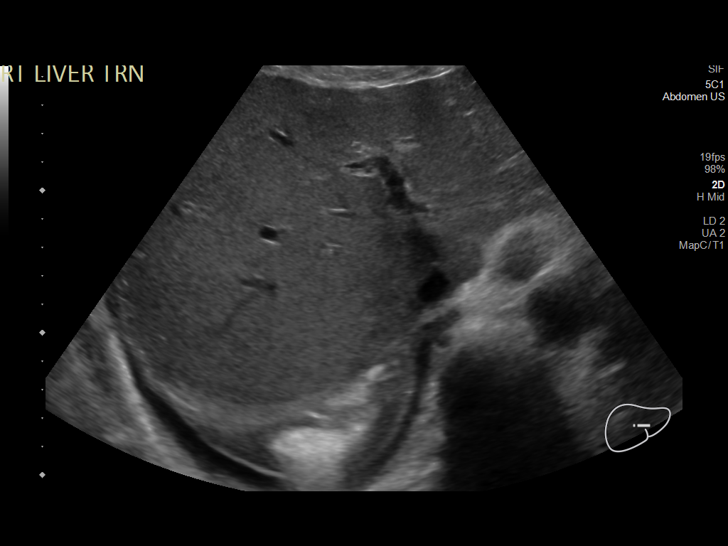
[im 24/38]
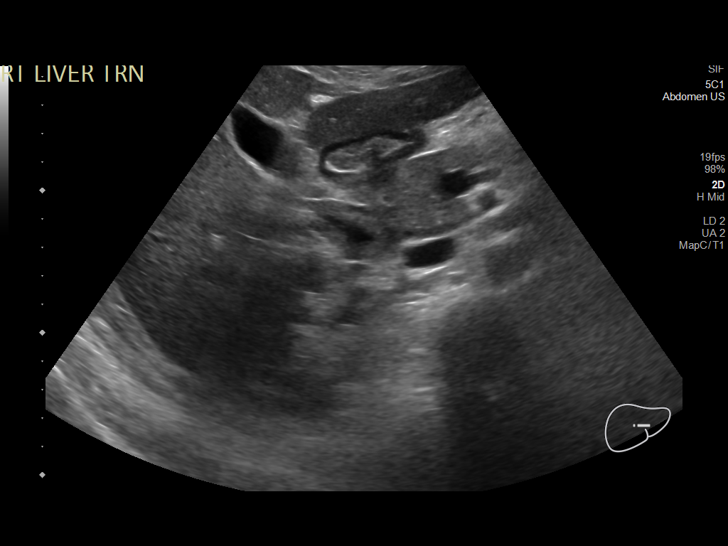
[im 25/38]
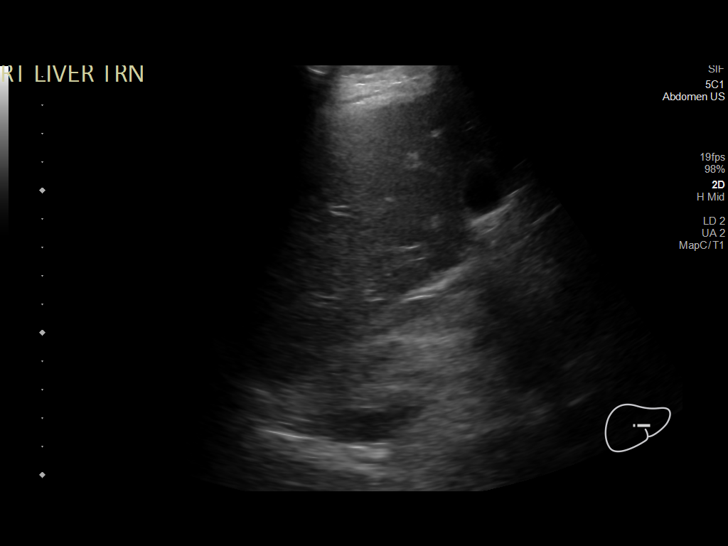
[im 28/38]
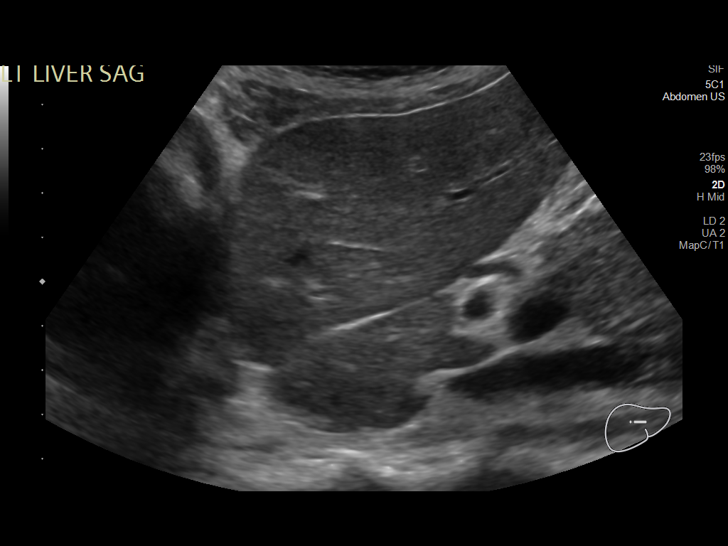
[im 31/38]
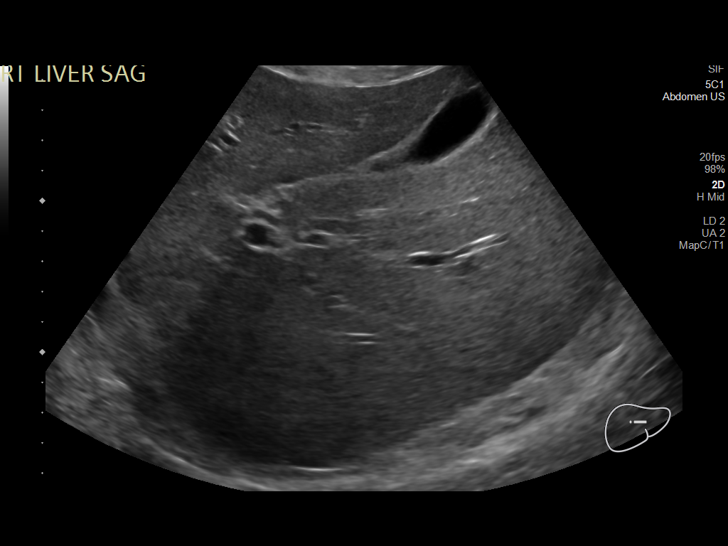
[im 34/38]
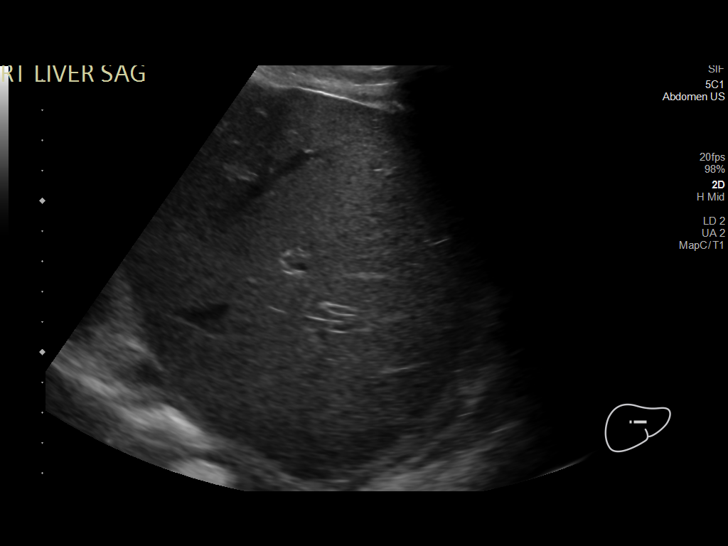
[im 38/38]
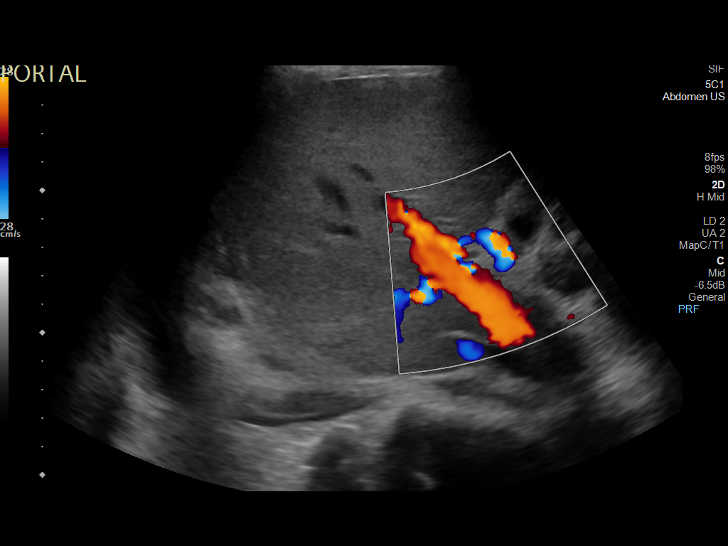

[14 of 25 positions shown; findings below may reference images not displayed]

FINDINGS: Gallbladder:

No gallstones or wall thickening visualized. No sonographic Murphy
sign noted by sonographer.

Common bile duct:

Diameter: 3 mm.

Liver:

No focal lesion identified. Within normal limits in parenchymal
echogenicity. Portal vein is patent on color Doppler imaging with
normal direction of blood flow towards the liver.

Other: At least trace bilateral pleural effusions.
IMPRESSION: 1. At least bilateral trace pleural effusions.
2. Otherwise unremarkable right upper quadrant ultrasound.

## 2021-07-24 MED ORDER — HALOPERIDOL LACTATE 5 MG/ML IJ SOLN
1.0000 mg | INTRAMUSCULAR | Status: DC | PRN
  Administered 2021-07-30 – 2021-08-17 (×2): 2 mg via INTRAVENOUS
  Administered 2021-08-17: 1 mg via INTRAVENOUS
  Administered 2021-08-17: 4 mg via INTRAVENOUS
  Administered 2021-08-17: 1 mg via INTRAVENOUS
  Administered 2021-08-17 – 2021-08-18 (×2): 4 mg via INTRAVENOUS
  Administered 2021-09-21: 2 mg via INTRAVENOUS
  Filled 2021-07-24 (×8): qty 1

## 2021-07-24 MED ORDER — SENNOSIDES-DOCUSATE SODIUM 8.6-50 MG PO TABS: 1.0000 | ORAL_TABLET | Freq: Two times a day (BID) | ORAL | Status: DC | PRN

## 2021-07-24 MED ORDER — METOPROLOL TARTRATE 5 MG/5ML IV SOLN
5.0000 mg | Freq: Four times a day (QID) | INTRAVENOUS | Status: DC | PRN
  Administered 2021-07-24 – 2021-08-18 (×8): 5 mg via INTRAVENOUS
  Filled 2021-07-24 (×10): qty 5

## 2021-07-24 MED ORDER — METOPROLOL TARTRATE 25 MG/10 ML ORAL SUSPENSION
25.0000 mg | Freq: Two times a day (BID) | ORAL | Status: DC
  Administered 2021-07-24 – 2021-07-25 (×3): 25 mg
  Filled 2021-07-24 (×3): qty 10

## 2021-07-24 MED ORDER — CLONAZEPAM 0.25 MG PO TBDP
2.0000 mg | ORAL_TABLET | Freq: Two times a day (BID) | ORAL | Status: DC
  Administered 2021-07-24 – 2021-07-28 (×9): 2 mg
  Filled 2021-07-24 (×10): qty 8

## 2021-07-24 MED ORDER — POTASSIUM CHLORIDE 20 MEQ PO PACK
40.0000 meq | PACK | Freq: Once | ORAL | Status: AC
  Administered 2021-07-24: 40 meq
  Filled 2021-07-24: qty 2

## 2021-07-24 NOTE — TOC Progression Note (Addendum)
Transition of Care [facility]) - Progression Note    Patient Details  Name: Brian [name] MRN: [id number] Date of Birth: Apr 19, 1961  [facility] of [facility]) CM/SW Contact  Mearl Latin, LCSW Phone Number: 07/24/2021, 3:08 PM  Clinical Narrative:    3pm-CSW left voicemail for patient's son, Brian [name]. CSW emailed Financial Counsel[employer] to see if they are able to assist with Medicaid application.   4:51pm-CSW received return call from patient's son. CSW discussed Medicaid application and son is interested if Financial Counseling can assist him. Otherwise, CSW instructed him to apply at Department of Social Services.      Barriers to Discharge: SNF Pending bed offer, SNF Pending payor source - LOG, SNF Pending Medicaid, Inadequate or no insurance  Expected Discharge Plan and Services   In-house Referral: Clinical Social Work                                             Social Determinants of Health (SDOH) Interventions    Readmission Risk Interventions No flowsheet data found.

## 2021-07-24 NOTE — Progress Notes (Signed)
° °  Providing Compassionate, Quality Care - Together   Subjective: Nurse reports no new issues overnight. The patient is still confused.  Objective: Vital signs in last 24 hours: Temp:  [98.1 F (36.7 C)-98.9 F (37.2 C)] 98.1 F (36.7 C) (02/28 0800) Pulse Rate:  [110-134] 134 (02/28 1130) Resp:  [21-38] 31 (02/28 1130) BP: (107-165)/(86-119) 144/100 (02/28 1130) SpO2:  [90 %-99 %] 90 % (02/28 1130) Weight:  [57.8 kg] 57.8 kg (02/27 1330)  Intake/Output from previous day: 02/27 0701 - 02/28 0700 In: 1680.5 [I.V.:450.5; NG/GT:1200] Out: 4000 [Urine:3600; Stool:400] Intake/Output this shift: Total I/O In: 108.5 [I.V.:25.2; NG/GT:83.3] Out: 185 [Urine:185]  Alert Oriented to person and place PERRLA Voice soft, hoarse Cortrak in left nare; infusing tube feed MAE, weakness most notable in RLE Incision is clean, dry, and intact  Lab Results: Recent Labs    07/23/21 0451 07/24/21 0231  WBC 12.9* 13.8*  HGB 9.1* 9.6*  HCT 26.7* 29.2*  PLT 459* 567*   BMET Recent Labs    07/23/21 0451 07/24/21 0231  NA 137 138  K 3.5 3.5  CL 97* 100  CO2 31 29  GLUCOSE 114* 102*  BUN 15 19  CREATININE 0.62 0.64  CALCIUM 9.2 9.3    Studies/Results: No results found.  Assessment/Plan: Patient status post C3-4, C4-5, C5-6, C6-7 anterior cervical discectomy with interbody fusion by Dr. Annette Stable on 06/29/2021. Increased difficulty swallowing with lung atelectasis and elevated temperatures on 07/02/2021. Made NPO following MBS by SLP. 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 CTA head and neck negative for stroke. MRI revealed epidural hematoma. Patient underwent exploration of his cervical fusion with evacuation of the epidural hematoma on 07/03/2021. Cortrak was placed on 07/04/2021. Patient's strength much improved since epidural hematoma evacuation. Cortrak removed 07/06/2021 and dysphagia 2 diet with nectar thick liquids started. Patient  with delirium vs ETOH withdrawal. Discontinued steroids 07/06/2021. CIWA protocol started and discontinued 07/07/2021. CT and MRI 07/07/2021 negative. Patient developed respiratory distress, requiring intubation on 07/15/2021. Work up revealed aspiration PNA and PE. Patient was extubated on 07/22/2021.   LOS: 25 days   -OK to remove soft collar -Continue to mobilize with therapies   Viona Gilmore, DNP, AGNP-C Nurse Practitioner  [facility] [place]. [address], [place], [place] [address] P: [phone]     F: [phone]  07/24/2021, 12:01 PM

## 2021-07-24 NOTE — Progress Notes (Signed)
[facility] ADULT ICU REPLACEMENT PROTOCOL   The patient does apply for the [facility] Adult ICU Electrolyte Replacment Protocol based on the criteria listed below:   1.Exclusion criteria: TCTS patients, ECMO patients, and Dialysis patients 2. Is GFR >/= 30 ml/min? Yes.    Patient's GFR today is >60 3. Is SCr </= 2? Yes.   Patient's SCr is 0.64 mg/dL 4. Did SCr increase >/= 0.5 in 24 hours? No. 5.Pt's weight >40kg  Yes.   6. Abnormal electrolyte(s): K+ 3.5  7. Electrolytes replaced per protocol 8.  Call MD STAT for K+ </= 2.5, Phos </= 1, or Mag </= 1 Physician:  n/a  Melvern Banker 07/24/2021 5:15 AM

## 2021-07-24 NOTE — Progress Notes (Signed)
NAME:  Brian [name], MRN:  [id number], DOB:  1961-02-02, LOS: 25 ADMISSION DATE:  06/29/2021, CONSULTATION DATE:  2/7 REFERRING MD:  Reinaldo Meeker, CHIEF COMPLAINT:  Confusion   History of Present Illness:  61 year old male with prior history of tobacco abuse and ETOH use who recently was found to have critical multilevel cervical spinal stenosis C3-6 with spinal cord compression and spinal cord signal change after recent fall with progressive upper and lower extremity weakness and tremors.  He was admitted on 2/3 and underwent ACDF of C3-4, C4-5, C5-5, and C6-7.  Post-operatively he was progressing slowly with residual weakness in both hands but improving lower extremity strength and function.  He developed some difficulty swallowing on 2/5 and SLP ordered and placed on thickened liquids.   Overnight 2/6, patient with increased difficulty swallowing and was made NPO but also noted to have dysarthria and difficulty moving extremities with numbness.  SLP evaluated and found to be moderate aspiration risk.   Also progressively tachycardic, tachypneic, and now febrile.  Code stroke activated and taken for CT/ CTA head and neck and was given decadron 73m once.  NIHSS 18.  CTH was negative for acute findings, and CTA head neck did not reveal any LVO but noted significant prevertebral soft tissue swelling with foci of gas present at the ventral epidural space at C4-5, small collection not excluded.  On 2/7, patient with worsening confusion and concern for airway involvement, PCCM consulted for further evaluation.   Pertinent  Medical History  Tobacco abuse, cervical stenosis, ETOH use  Significant Hospital Events: Including procedures, antibiotic start and stop dates in addition to other pertinent events   2/3 ACDF C3-4, C4-5, C5-5, and C6-7 w/ Dr. PAnnette Stable2/6 SLP eval for difficulty swallowing 2/7 Code stroke overnight, neg for LVO, CT showing soft tissue swelling.  PCCM consulted for concern of airway  management and AMS.  Went for evacuation of hematoma  2/18 CT Abd/Pelvis: showing bilateral segmental Pes, started on heparin and showing worsening pneumonia, febrile to  103.  PCT rising, 34.6, restarted on abx 2/19 possible aspiration w/ severe hypoxia; intubated; Switching from heparin to angiomax given subtherapeutic levels despite increase in rate. 2/20 Echo shows normal LV systolic function. There was notation of a + McConnell's sign c/w a large pulmonary embolus which is consistent with the finding of bilateral segmental pulmonary emboli noted on CT 07/14/2021. 2/21 rash appreciated on back; stopped cefepim/vanc switched to zosyn; required low dose levo with increase in fentanyl 2/22 remains intubated; unable to extubate to do increase RR and thick secretions 2/23 [name] infusion added 2/24: weaning fentanyl.  2/26 extubated  Images  2/7 CT angio head and neck > No large vessel occlusion, hemodynamically significant stenosis, or evidence of dissection. Significant prevertebral soft tissue swelling. Foci of gas are present the ventral epidural space at C4-C5; a small collection is not excluded.  Abx: 2/18 Cefepime > 2/20 2/18 Flagyl 2/19  2/18 vanc > 2/21 2/21 Zosyn > 2/22   Interim History / Subjective:   Some agitation overnight Remains on precedex   Objective   Blood pressure (!) 135/100, pulse (!) 112, temperature 98.9 F (37.2 C), temperature source Axillary, resp. rate (!) 29, height 5' 10" (1.778 m), weight 57.8 kg, SpO2 96 %.        Intake/Output Summary (Last 24 hours) at 07/24/2021 0824 Last data filed at 07/24/2021 0800 Gross per 24 hour  Intake 1712.26 ml  Output 4000 ml  Net -2287.74 ml  Filed Weights   07/22/21 0500 07/23/21 0409 07/23/21 1330  Weight: 61.8 kg 57.8 kg 57.8 kg    Examination:  General:  Resting comfortably in bed HENT: NCAT OP clear soft collar in place PULM: CTA B, normal effort CV: RRR, no mgr GI: BS+, soft, nontender MSK:  normal bulk and tone Neuro: awake, confused but able to answer questions, [facility] Problem list   Septic shock secondary to aspiration pneumonia  Assessment & Plan:  Acute respiratory failure with hypoxemia in setting of aspiration pneumonia> improved Wean off Oxygen for O2 saturation > 88% Aspiration precautions  Bilateral segmental PE Sinus Tachycardia with rare PVC Continue elquis Prn metoprolol  Severe cervical spinal stenosis with cord compression, s/p ACDF S3-4 on 2/3, complicated by epidural hematoma s/p evacuation 2/7 Hyperactive delirium> slightly improved EtOH abuse at baseline Stop precedex Add prn haldol Increase clonazepam  Elevated Alk phos with elevated AST/ALT, uncertain etiology Check RUQ ultrasound and acute hepatitis panel  EtOH abuse Drug abuse Tobacco abuse Counsel to quit  Constipation Protein calorie malnutrition, moderate Dysphagia Tube feeding, stool softeners  Normocytic anemia Monitor for bleeding Transfuse PRBC for Hgb < 7 gm/dL  Urinary retention Cardura bethanechol  Best Practice (right click and "Reselect all SmartList Selections" daily)   Diet/type: tubefeeds DVT prophylaxis: othereliquis GI prophylaxis: N/A Lines: N/A Foley:  Yes, and it is no longer needed Code Status:  full code Last date of multidisciplinary goals of care discussion [2/27 tried to call son [name], no answer]  Labs   CBC: Recent Labs  Lab 07/20/21 0341 07/21/21 0330 07/22/21 0128 07/23/21 0451 07/24/21 0231  WBC 14.3* 13.7* 13.3* 12.9* 13.8*  NEUTROABS  --   --   --   --  8.7*  HGB 8.6* 8.5* 7.9* 9.1* 9.6*  HCT 26.7* 26.3* 23.5* 26.7* 29.2*  MCV 97.8 97.4 95.9 94.0 95.1  PLT 364 374 360 459* 567*    Basic Metabolic Panel: Recent Labs  Lab 07/18/21 0643 07/19/21 0623 07/19/21 1437 07/20/21 0341 07/21/21 0330 07/22/21 0128 07/23/21 0451 07/24/21 0231  NA 140   141 140   < > 140 141   141 138 137 138  K 3.9   3.9 3.7    < > 3.8 3.7   3.7 4.0 3.5 3.5  CL 110   110 108  --  108 105   105 103 97* 100  CO2 _0 --  _1 GLUCOSE 117*   115* 96  --  139* 121*   120* 131* 114* 102*  BUN 22*   22* 16  --  _2 CREATININE 0.76   0.77 0.74  --  0.66 0.69   0.69 0.55* 0.62 0.64  CALCIUM 8.6*   8.6* 8.8*  --  9.0 9.0   8.9 8.8* 9.2 9.3  MG 1.9 1.7  --   --  1.6*  --   --   --   PHOS 3.5 2.7  --  2.3* 2.9   3.0 3.2 2.3* 2.6   < > = values in this interval not displayed.   GFR: Estimated Creatinine Clearance: 80.3 mL/min (by C-G formula based on SCr of 0.64 mg/dL). Recent Labs  Lab 07/18/21 1962 07/19/21 0623 07/21/21 0330 07/22/21 0128 07/23/21 0451 07/24/21 0231  PROCALCITON 9.19  --   --   --   --   --  WBC 17.8*   < > 13.7* 13.3* 12.9* 13.8*   < > = values in this interval not displayed.    Liver Function Tests: Recent Labs  Lab 07/20/21 0341 07/21/21 0330 07/22/21 0128 07/23/21 0451 07/24/21 0231  AST  --   --   --   --  116*  ALT  --   --   --   --  93*  ALKPHOS  --   --   --   --  349*  BILITOT  --   --   --   --  0.5  PROT  --   --   --   --  7.9  ALBUMIN <1.5* <1.5* <1.5* 1.7* 2.0*   No results for input(s): LIPASE, AMYLASE in the last 168 hours. No results for input(s): AMMONIA in the last 168 hours.  ABG    Component Value Date/Time   PHART 7.450 07/19/2021 1437   PCO2ART 34.6 07/19/2021 1437   PO2ART 51 (L) 07/19/2021 1437   HCO3 24.1 07/19/2021 1437   TCO2 25 07/19/2021 1437   ACIDBASEDEF 2.0 07/17/2021 1641   O2SAT 88 07/19/2021 1437     Coagulation Profile: No results for input(s): INR, PROTIME in the last 168 hours.  Cardiac Enzymes: No results for input(s): CKTOTAL, CKMB, CKMBINDEX, TROPONINI in the last 168 hours.  HbA1C: Hgb A1c MFr Bld  Date/Time Value Ref Range Status  07/05/2021 03:39 AM 5.2 4.8 - 5.6 % Final    Comment:    (NOTE) Pre diabetes:          5.7%-6.4%  Diabetes:              >6.4%  Glycemic control  for   <7.0% adults with diabetes     CBG: Recent Labs  Lab 07/23/21 1541 07/23/21 2014 07/23/21 2358 07/24/21 0412 07/24/21 0722  GLUCAP 132* 137* 121* 133* 119*    Critical care time: 32 minutes    Roselie Awkward, MD [place] [facility] Pager: [phone] Cell: [phone] After 7:00 pm call Elink  [phone]

## 2021-07-24 NOTE — Progress Notes (Signed)
Patient ST 130-150's sustaining. Patient calm and cooperative, no agitation at present. PRN lopressor given with short tern effect. CCM aware, orders noted.

## 2021-07-24 NOTE — Evaluation (Signed)
Clinical/Bedside Swallow Evaluation Patient Details  Name: Brian [name] MRN: [id number] Date of Birth: 15-Jul-1960  Today's Date: 07/24/2021 Time: SLP Start Time (ACUTE ONLY): 1016 SLP Stop Time (ACUTE ONLY): 1026 SLP Time Calculation (min) (ACUTE ONLY): 10 min  Past Medical History: History reviewed. No pertinent past medical history. Past Surgical History:  Past Surgical History:  Procedure Laterality Date   ANTERIOR CERVICAL DECOMPRESSION/DISCECTOMY FUSION 4 LEVELS N/A 06/29/2021   Procedure: Anterior Cervical Discectomy Fusion - Cervical Three-Cervical Four - Cervical Four- Cervical Five - Cervical Five- Cervical Six - Cervical Six- Cervical Seven;  Surgeon: Earnie Larsson, MD;  Location: [facility];  Service: Neurosurgery;  Laterality: N/A;   HEMATOMA EVACUATION N/A 07/03/2021   Procedure: EXPLORATION OF NECK AND REMOVE BLOOD CLOT;  Surgeon: Earnie Larsson, MD;  Location: [facility];  Service: Neurosurgery;  Laterality: N/A;   HPI:  61 y/o admitted 06/29/21 following C3-7 ACDF after progressive cervical myelopathy symptoms with subsequent fall and severe incomplete SCI. No significant PMH. He developed some difficulty swallowing on 2/5 and SLP ordered and placed on thickened liquids.   Overnight 2/6, patient with increased difficulty swallowing and was made NPO but also noted to have dysarthria and difficulty moving extremities with numbness. Code stroke called with no LVO.  Pt developed pna, possible aspiration, requiring intubation 2/19-26. Significant prevertebral swelling per imaging.    Assessment / Plan / Recommendation  Clinical Impression  Pt presents with high risk for aspiration in setting of recent ACDF with significant prevertebral swelling, now s/p 7 day intubation.  Pt did not follow directions for OME. No focal deficits observed.  Decreased mandibular excusion noted and presumed to be 2/2 presence of soft cervical collar.  With very small ice chip, pt exhibited oral response.  There was wet  sound noted which may have been throat clear or audible swallow.  With small amount of water by spoon, pt required multiple swallows and wet vocal quality heard again.  Pt decline further PO trials.  Pt tachycardic and RN notified.  Pt is not appropriate for further assessment at this time.  SLP to follow for readiness for instrumental swallow evaluation.  Recommend pt remain NPO with alternate means of nutrition, hydration, and medication.  SLP Visit Diagnosis: Dysphagia, pharyngeal phase (R13.13)    Aspiration Risk  Severe aspiration risk    Diet Recommendation NPO;Alternative means - temporary   Medication Administration: Via alternative means    Other  Recommendations Oral Care Recommendations: Oral care QID Other Recommendations: Have oral suction available    Recommendations for follow up therapy are one component of a multi-disciplinary discharge planning process, led by the attending physician.  Recommendations may be updated based on patient status, additional functional criteria and insurance authorization.  Follow up Recommendations  (TBD)      Assistance Recommended at Discharge Frequent or constant Supervision/Assistance  Functional Status Assessment Patient has had a recent decline in their functional status and demonstrates the ability to make significant improvements in function in a reasonable and predictable amount of time.  Frequency and Duration min 2x/week  2 weeks       Prognosis Prognosis for Safe Diet Advancement: Good      Swallow Study   General HPI: 61 y/o admitted 06/29/21 following C3-7 ACDF after progressive cervical myelopathy symptoms with subsequent fall and severe incomplete SCI. No significant PMH. He developed some difficulty swallowing on 2/5 and SLP ordered and placed on thickened liquids.   Overnight 2/6, patient with increased difficulty swallowing and  was made NPO but also noted to have dysarthria and difficulty moving extremities with  numbness. Code stroke called with no LVO.  Pt developed pna, possible aspiration, requiring intubation 2/19-26. Significant prevertebral swelling per imaging. Type of Study: Bedside Swallow Evaluation Previous Swallow Assessment: MBS this admission 2/10 Diet Prior to this Study: NPO;NG Tube Respiratory Status: Nasal cannula History of Recent Intubation: Yes Length of Intubations (days): 7 days Date extubated: 07/22/21 Behavior/Cognition: Alert Oral Cavity Assessment:  (unable to assess) Oral Care Completed by SLP: No Oral Cavity - Dentition: Poor condition Self-Feeding Abilities: Total assist Patient Positioning: Upright in bed Baseline Vocal Quality: Not observed Volitional Cough: Cognitively unable to elicit Volitional Swallow: Unable to elicit    Oral/Motor/Sensory Function Overall Oral Motor/Sensory Function:  (unable to assess)   Ice Chips Ice chips: Impaired Oral Phase Impairments: Impaired mastication Pharyngeal Phase Impairments: Throat Clearing - Immediate   Thin Liquid Thin Liquid: Impaired Presentation: Spoon Pharyngeal  Phase Impairments: Multiple swallows;Throat Clearing - Immediate    Nectar Thick Nectar Thick Liquid: Not tested   Honey Thick Honey Thick Liquid: Not tested   Puree Puree: Not tested   Solid     Solid: Not tested      Celedonio Savage, [place], [place] Office: [phone] 07/24/2021,10:37 AM

## 2021-07-25 ENCOUNTER — Inpatient Hospital Stay (HOSPITAL_COMMUNITY): Payer: 59

## 2021-07-25 DIAGNOSIS — I2694 Multiple subsegmental pulmonary emboli without acute cor pulmonale: Secondary | ICD-10-CM

## 2021-07-25 DIAGNOSIS — G959 Disease of spinal cord, unspecified: Secondary | ICD-10-CM | POA: Diagnosis not present

## 2021-07-25 LAB — GLUCOSE, CAPILLARY
Glucose-Capillary: 100 mg/dL — ABNORMAL HIGH (ref 70–99)
Glucose-Capillary: 105 mg/dL — ABNORMAL HIGH (ref 70–99)
Glucose-Capillary: 105 mg/dL — ABNORMAL HIGH (ref 70–99)
Glucose-Capillary: 107 mg/dL — ABNORMAL HIGH (ref 70–99)
Glucose-Capillary: 108 mg/dL — ABNORMAL HIGH (ref 70–99)
Glucose-Capillary: 111 mg/dL — ABNORMAL HIGH (ref 70–99)
Glucose-Capillary: 115 mg/dL — ABNORMAL HIGH (ref 70–99)

## 2021-07-25 LAB — ECHOCARDIOGRAM LIMITED
Height: 70 in
S' Lateral: 2.3 cm
Weight: 1855.39 oz

## 2021-07-25 LAB — RENAL FUNCTION PANEL
Albumin: 2.2 g/dL — ABNORMAL LOW (ref 3.5–5.0)
Anion gap: 10 (ref 5–15)
BUN: 22 mg/dL — ABNORMAL HIGH (ref 6–20)
CO2: 22 mmol/L (ref 22–32)
Calcium: 9.4 mg/dL (ref 8.9–10.3)
Chloride: 107 mmol/L (ref 98–111)
Creatinine, Ser: 0.65 mg/dL (ref 0.61–1.24)
GFR, Estimated: >60 mL/min (ref >60)
Glucose, Bld: 127 mg/dL — ABNORMAL HIGH (ref 70–99)
Phosphorus: 3 mg/dL (ref 2.5–4.6)
Potassium: 3.2 mmol/L — ABNORMAL LOW (ref 3.5–5.1)
Sodium: 139 mmol/L (ref 135–145)

## 2021-07-25 LAB — COMPREHENSIVE METABOLIC PANEL
ALT: 103 U/L — ABNORMAL HIGH (ref 0–44)
AST: 109 U/L — ABNORMAL HIGH (ref 15–41)
Albumin: 2.2 g/dL — ABNORMAL LOW (ref 3.5–5.0)
Alkaline Phosphatase: 324 U/L — ABNORMAL HIGH (ref 38–126)
Anion gap: 9 (ref 5–15)
BUN: 22 mg/dL — ABNORMAL HIGH (ref 6–20)
CO2: 23 mmol/L (ref 22–32)
Calcium: 9.4 mg/dL (ref 8.9–10.3)
Chloride: 107 mmol/L (ref 98–111)
Creatinine, Ser: 0.67 mg/dL (ref 0.61–1.24)
GFR, Estimated: >60 mL/min (ref >60)
Glucose, Bld: 124 mg/dL — ABNORMAL HIGH (ref 70–99)
Potassium: 3.2 mmol/L — ABNORMAL LOW (ref 3.5–5.1)
Sodium: 139 mmol/L (ref 135–145)
Total Bilirubin: 0.6 mg/dL (ref 0.3–1.2)
Total Protein: 8.2 g/dL — ABNORMAL HIGH (ref 6.5–8.1)

## 2021-07-25 LAB — GAMMA GT: GGT: 310 U/L — ABNORMAL HIGH (ref 7–50)

## 2021-07-25 MED ORDER — POTASSIUM CHLORIDE 20 MEQ PO PACK
40.0000 meq | PACK | ORAL | Status: AC
  Administered 2021-07-25 (×2): 40 meq
  Filled 2021-07-25 (×2): qty 2

## 2021-07-25 MED ORDER — LACTATED RINGERS IV BOLUS
500.0000 mL | Freq: Once | INTRAVENOUS | Status: AC
  Administered 2021-07-25: 500 mL via INTRAVENOUS

## 2021-07-25 MED ORDER — LACTATED RINGERS IV SOLN: INTRAVENOUS | Status: DC

## 2021-07-25 MED ORDER — METOPROLOL TARTRATE 25 MG/10 ML ORAL SUSPENSION
50.0000 mg | Freq: Two times a day (BID) | ORAL | Status: DC
Start: 2021-07-25 — End: 2021-07-26
  Administered 2021-07-25 – 2021-07-26 (×2): 50 mg
  Filled 2021-07-25 (×3): qty 20

## 2021-07-25 NOTE — Progress Notes (Signed)
Speech Language Pathology Treatment: Dysphagia  ?Patient Details ?Name: Brian [name] ?MRN: [id number] ?DOB: Jun 23, 1960 ?Today's Date: 07/25/2021 ?Time: 2992-4268 ?SLP Time Calculation (min) (ACUTE ONLY): 14 min ? ?Assessment / Plan / Recommendation ?Clinical Impression ? Pt was seen for dysphagia treatment. He was alert and cooperative during the session, but demonstrated confusion and was insistent that he has known this SLP for at least two years. Pt tolerated puree solids, regular texture solids, and thin liquids via cup and straw without overt s/sx of aspiration. Mastication was prolonged, but oral clearance was adequate. Multiple swallows were demonstrated across consistencies, suggesting possible pharyngeal residue. A modified barium swallow study is recommended to further assess swallow function and it will be planned for 3/2. Pt's NPO status will be maintained, but he may have ice chips after oral care.  ?  ?HPI HPI: 61 y/o admitted 06/29/21 following C3-7 ACDF after progressive cervical myelopathy symptoms with subsequent fall and severe incomplete SCI. No significant PMH. He developed some difficulty swallowing on 2/5 and SLP ordered and placed on thickened liquids.   Overnight 2/6, patient with increased difficulty swallowing and was made NPO but also noted to have dysarthria and difficulty moving extremities with numbness. Code stroke called with no LVO.  Pt developed pna, possible aspiration, requiring intubation 2/19-26. Significant prevertebral swelling per imaging. ?  ?   ?SLP Plan ? Continue with current plan of care ? ?  ?  ?Recommendations for follow up therapy are one component of a multi-disciplinary discharge planning process, led by the attending physician.  Recommendations may be updated based on patient status, additional functional criteria and insurance authorization. ?  ? ?Recommendations  ?Diet recommendations: NPO (pt may have ice chips after oral care) ?Medication Administration: Via  alternative means  ?   ?    ?   ? ? ? ? Oral Care Recommendations: Oral care QID;Oral care prior to ice chip/H20 ?Follow Up Recommendations: Skilled nursing-short term rehab (<3 hours/day) ?Assistance recommended at discharge: Frequent or constant Supervision/Assistance ?SLP Visit Diagnosis: Dysphagia, pharyngeal phase (R13.13) ?Plan: Continue with current plan of care ? ? ? ? ?  ?  ? ?Brian [name] I. Vear Clock, MS, CCC-SLP ?Acute Rehabilitation Services ?Office number [phone] ?Pager [phone] ? ?Brian [name] ? ?07/25/2021, 12:38 PM ? ? ? ?

## 2021-07-25 NOTE — Progress Notes (Signed)
Echocardiogram ?2D Echocardiogram has been performed. ? ?Eduard Roux ?07/25/2021, 2:06 PM ?

## 2021-07-25 NOTE — Progress Notes (Signed)
Occupational Therapy Treatment Patient Details Name: Brian [name] MRN: [id number] DOB: Jul 12, 1960 Today's Date: 07/25/2021   History of present illness Pt is a 61 y/o male admitted 06/29/21 following C3-7 ACDF after progressive cervical myelopathy symptoms including ataxia, bil UE and LE shaking and weakness, and loss of function in his hands with subsequent fall. Pt initially had improvement in neurological symptoms, but postoperative course was complicated by dysphasia and ataxia secondary to and epidural hematoma with evacuation of hematoma on 2/7. Course complicated by delirium secondary to encephalopathy, sepsis secondary to aspiration PNA, reintubated 2/19-2/26 for airway protection, bilat segmental PEs 2/18. PMHx includes tobacco and EtOH use disorder.   OT comments  Pt with increased delirium, frequently mumbling and talking about unrelated things to what was going on - but overall pleasant and willing to participate in therapy. Pt continues to be overall total A for ADL at this time. Would recommend resting hand splints for nighttime use. Pt not in a cognitive state to address forearm based u cuffs at this time. Pt was max A +2 for bed mobility - Pt with good initiation for single step commands to assist with mobility "bend your knee" but ultimately requiring max to total A of 2 to come sit EOB. In sitting we worked on balance - had the best success when holding BUE in the front and cueing Pt to engage trunk for approx 5 seconds at a time before fatigue. Overall strong posterior bias. We were able to perform sit<>stand x2 (unable to reach full upright) with max to total A +2 for WB through BLE. OT will continue to follow acutely- POC remains appropriate.   Recommendations for follow up therapy are one component of a multi-disciplinary discharge planning process, led by the attending physician.  Recommendations may be updated based on patient status, additional functional criteria and insurance  authorization.    Follow Up Recommendations  Skilled nursing-short term rehab (<3 hours/day)    Assistance Recommended at Discharge Frequent or constant Supervision/Assistance  Patient can return home with the following  Two people to help with walking and/or transfers;Direct supervision/assist for medications management;Assist for transportation;Help with stairs or ramp for entrance;Assistance with feeding;Two people to help with bathing/dressing/bathroom;Assistance with cooking/housework;Direct supervision/assist for Chartered loss adjuster (measurements OT);Wheelchair cushion (measurements OT);Hospital bed;BSC/3in1;Tub/shower seat    Recommendations for Other Services      Precautions / Restrictions Precautions Precautions: Fall;Cervical Required Braces or Orthoses: Cervical Brace Cervical Brace: Soft collar;For comfort;Other (comment) (ok for bathroom mobility w/o collar, don sitting EOB) Restrictions Weight Bearing Restrictions: No       Mobility Bed Mobility Overal bed mobility: Needs Assistance Bed Mobility: Rolling, Sidelying to Sit, Sit to Supine Rolling: Max assist, +2 for safety/equipment (Pt able to initiate bending knee up to assist with rolling) Sidelying to sit: Total assist, +2 for physical assistance   Sit to supine: Total assist, +2 for physical assistance, +2 for safety/equipment (helicopter technique used due to Pt fatiguing)   General bed mobility comments: pt demonstrating increased initiation with clear, single cues    Transfers Overall transfer level: Needs assistance Equipment used: 2 person hand held assist Transfers: Sit to/from Stand Sit to Stand: Max assist, +2 physical assistance, +2 safety/equipment           General transfer comment: unable to reach full upright, attempted x2     Balance Overall balance assessment: Needs assistance Sitting-balance support: Feet supported Sitting balance-Leahy  Scale: Poor Sitting balance - Comments: at  least mod posterior truncal support, preference for posterior and R lateral leaning. with hand holding in front, able to maintain upright seated posture with mod A for approx 5 seconds before fatigue requiring increased support   Standing balance support: Bilateral upper extremity supported Standing balance-Leahy Scale: Zero                             ADL either performed or assessed with clinical judgement   ADL                                         General ADL Comments: total A for all aspects of ADL at this time due to increased delirium    Extremity/Trunk Assessment Upper Extremity Assessment Upper Extremity Assessment: Generalized weakness RUE Deficits / Details: Can supinate and start to bring hand to mouth, trace shoulder flexion and abduction, 2/5 shoulder retraction and adduction, no movement in wrist or fingers noted RUE Sensation: decreased light touch;decreased proprioception RUE Coordination: decreased fine motor;decreased gross motor LUE Deficits / Details: Can supinate and start to bring hand to mouth, trace shoulder flexion and abduction, 2/5 shoulder retraction and adduction, no movement in wrist , trace movement in fingers for flexion LUE Sensation: decreased light touch LUE Coordination: decreased fine motor;decreased gross motor            Vision       Perception     Praxis      Cognition Arousal/Alertness: Awake/alert Behavior During Therapy: Restless Overall Cognitive Status: Impaired/Different from baseline Area of Impairment: Orientation, Attention, Following commands, Safety/judgement, Problem solving                 Orientation Level: Disoriented to, Time, Place Current Attention Level: Sustained   Following Commands: Follows one step commands with increased time Safety/Judgement: Decreased awareness of deficits, Decreased awareness of safety   Problem Solving:  Slow processing, Difficulty sequencing, Requires verbal cues, Requires tactile cues General Comments: Pt mumbles throughout session, [place] some comments are appropriate responses, but mostly seems like delirium, does demonstrate initiation of functional tasks with clear one step instructions        Exercises Exercises: Other exercises Other Exercises Other Exercises: streching of digits on Bil Hands into full extension x 5    Shoulder Instructions       General Comments RN present throughout session, PT HR >110 throughout session, SpO2 >90%    Pertinent Vitals/ Pain       Pain Assessment Pain Assessment: Faces Faces Pain Scale: Hurts a little bit Pain Location: joints with ROM Pain Descriptors / Indicators: Grimacing, Guarding, Sore Pain Intervention(s): Limited activity within patient's tolerance, Monitored during session, Repositioned  Home Living                                          Prior Functioning/Environment              Frequency  Min 2X/week        Progress Toward Goals  OT Goals(current goals can now be found in the care plan section)  Progress towards OT goals: Progressing toward goals (from previous session)  Acute Rehab OT Goals Patient Stated Goal: none stated today OT Goal Formulation: With patient  Time For Goal Achievement: 08/06/21 Potential to Achieve Goals: [place] Discharge plan remains appropriate    Co-evaluation    PT/OT/SLP Co-Evaluation/Treatment: Yes Reason for Co-Treatment: Complexity of the patient's impairments (multi-system involvement);Necessary to address cognition/behavior during functional activity;For patient/therapist safety;To address functional/ADL transfers PT goals addressed during session: Mobility/safety with mobility;Balance;Strengthening/ROM OT goals addressed during session: Strengthening/ROM      AM-PAC OT "6 Clicks" Daily Activity     Outcome Measure   Help from another  person eating meals?: Total Help from another person taking care of personal grooming?: Total Help from another person toileting, which includes using toliet, bedpan, or urinal?: Total Help from another person bathing (including washing, rinsing, drying)?: Total Help from another person to put on and taking off regular upper body clothing?: Total Help from another person to put on and taking off regular lower body clothing?: Total 6 Click Score: 6    End of Session Equipment Utilized During Treatment: Gait belt  OT Visit Diagnosis: Other abnormalities of gait and mobility (R26.89);Muscle weakness (generalized) (M62.81);Feeding difficulties (R63.3)   Activity Tolerance Patient tolerated treatment well   Patient Left in bed;with call bell/phone within reach;with bed alarm set   Nurse Communication Mobility status;Precautions        Time: [id number] OT Time Calculation (min): 33 min  Charges: OT General Charges $OT Visit: 1 Visit OT Treatments $Therapeutic Activity: 8-22 mins  [name] OTR/L Acute Rehabilitation Services Pager: [phone] Office: [place] 07/25/2021, 11:05 AM

## 2021-07-25 NOTE — Progress Notes (Signed)
Physical Therapy Treatment ?Patient Details ?Name: Brian [name] ?MRN: [id number] ?DOB: 1961/02/03 ?Today's Date: 07/25/2021 ? ? ?History of Present Illness Pt is a 61 y/o male admitted 06/29/21 following C3-7 ACDF after progressive cervical myelopathy symptoms including ataxia, bil UE and LE shaking and weakness, and loss of function in his hands with subsequent fall. Pt initially had improvement in neurological symptoms, but postoperative course was complicated by dysphasia and ataxia secondary to and epidural hematoma with evacuation of hematoma on 2/7. Course complicated by delirium secondary to encephalopathy, sepsis secondary to aspiration PNA, reintubated 2/19-2/26 for airway protection, bilat segmental PEs 2/18. PMHx includes tobacco and EtOH use disorder. ? ?  ?PT Comments  ? ? Pt tolerates treatment well, demonstrating initiation of rolling with cues and increased time. Pt is able to utilize BUE support in an effort to maintain sitting balance, however core strength remains weak. PT/OT assist in standing attempts however pt is unable to extend knees/hips, with some flexor tone noted. Pt will benefit from continued acute PT services in an effort to reduce caregiver burden and the risk of pressure injuries.   ?Recommendations for follow up therapy are one component of a multi-disciplinary discharge planning process, led by the attending physician.  Recommendations may be updated based on patient status, additional functional criteria and insurance authorization. ? ?Follow Up Recommendations ? Skilled nursing-short term rehab (<3 hours/day) ?  ?  ?Assistance Recommended at Discharge Frequent or constant Supervision/Assistance  ?Patient can return home with the following Two people to help with walking and/or transfers;Two people to help with bathing/dressing/bathroom;Direct supervision/assist for medications management;Direct supervision/assist for financial management;Assist for transportation;Assistance with  feeding;Assistance with cooking/housework;Help with stairs or ramp for entrance ?  ?Equipment Recommendations ? Wheelchair (measurements PT);Hospital bed;Wheelchair cushion (measurements PT);BSC/3in1;Other (comment) (hoyer lift)  ?  ?Recommendations for Other Services   ? ? ?  ?Precautions / Restrictions Precautions ?Precautions: Fall;Cervical ?Precaution Booklet Issued: Yes (comment) ?Required Braces or Orthoses: Cervical Brace ?Cervical Brace: Soft collar;For comfort;Other (comment) ?Restrictions ?Weight Bearing Restrictions: No  ?  ? ?Mobility ? Bed Mobility ?Overal bed mobility: Needs Assistance ?Bed Mobility: Rolling, Sidelying to Sit, Sit to Sidelying ?Rolling: Max assist, +2 for physical assistance ?Sidelying to sit: Total assist, +2 for physical assistance ?  ?Sit to supine: Total assist, +2 for physical assistance ?  ?General bed mobility comments: pt initiates with bend of knees, able to reach across with arm for roll ?  ? ?Transfers ?Overall transfer level: Needs assistance ?Equipment used: 2 person hand held assist ?Transfers: Sit to/from Stand ?Sit to Stand: Total assist, +2 physical assistance, From elevated surface ?  ?  ?  ?  ?  ?General transfer comment: unable to extend knees and hips, knee block bilaterally. 2 sit to stands attempted ?  ? ?Ambulation/Gait ?  ?  ?  ?  ?  ?  ?  ?  ? ? ?Stairs ?  ?  ?  ?  ?  ? ? ?Wheelchair Mobility ?  ? ?Modified Rankin (Stroke Patients Only) ?  ? ? ?  ?Balance Overall balance assessment: Needs assistance ?Sitting-balance support: Bilateral upper extremity supported, Feet unsupported ?Sitting balance-Leahy Scale: Poor ?Sitting balance - Comments: maxA, pt able to pull with BUE briefly in attempt to correct loss of balance. At least modA due to posterior lean ?Postural control: Posterior lean ?Standing balance support: Bilateral upper extremity supported ?Standing balance-Leahy Scale: Zero ?  ?  ?  ?  ?  ?  ?  ?  ?  ?  ?  ?  ?  ? ?  ?  Cognition Arousal/Alertness:  Awake/alert ?Behavior During Therapy: Restless, Impulsive ?Overall Cognitive Status: Impaired/Different from baseline ?Area of Impairment: Orientation, Attention, Memory, Following commands, Safety/judgement, Awareness, Problem solving ?  ?  ?  ?  ?  ?  ?  ?  ?Orientation Level: Disoriented to, Place, Time ?Current Attention Level: Focused ?Memory: Decreased short-term memory, Decreased recall of precautions ?Following Commands: Follows one step commands with increased time ?Safety/Judgement: Decreased awareness of safety, Decreased awareness of deficits ?Awareness: Intellectual ?Problem Solving: Difficulty sequencing, Requires verbal cues, Requires tactile cues, Slow processing ?  ?  ?  ? ?  ?Exercises General Exercises - Lower Extremity ?Long Arc Quad: AROM, Both, 5 reps ? ?  ?General Comments General comments (skin integrity, edema, etc.): tachy into 120s, BP stable ?  ?  ? ?Pertinent Vitals/Pain Pain Assessment ?Pain Assessment: Faces ?Faces Pain Scale: Hurts a little bit  ? ? ?Home Living   ?  ?  ?  ?  ?  ?  ?  ?  ?  ?   ?  ?Prior Function    ?  ?  ?   ? ?PT Goals (current goals can now be found in the care plan section) Acute Rehab PT Goals ?Patient Stated Goal: to get better and be independent, back to PLOF ?Progress towards PT goals: Progressing toward goals (slowly) ? ?  ?Frequency ? ? ? Min 3X/week ? ? ? ?  ?PT Plan Current plan remains appropriate  ? ? ?Co-evaluation PT/OT/SLP Co-Evaluation/Treatment: Yes ?Reason for Co-Treatment: Complexity of the patient's impairments (multi-system involvement);Necessary to address cognition/behavior during functional activity;For patient/therapist safety;To address functional/ADL transfers ?PT goals addressed during session: Mobility/safety with mobility;Balance;Strengthening/ROM ?OT goals addressed during session: Strengthening/ROM ?  ? ?  ?AM-PAC PT "6 Clicks" Mobility   ?Outcome Measure ? Help needed turning from your back to your side while in a flat bed without  using bedrails?: Total ?Help needed moving from lying on your back to sitting on the side of a flat bed without using bedrails?: Total ?Help needed moving to and from a bed to a chair (including a wheelchair)?: Total ?Help needed standing up from a chair using your arms (e.g., wheelchair or bedside chair)?: Total ?Help needed to walk in hospital room?: Total ?Help needed climbing 3-5 steps with a railing? : Total ?6 Click Score: 6 ? ?  ?End of Session   ?Activity Tolerance: Patient tolerated treatment well ?Patient left: in bed;with call bell/phone within reach;with bed alarm set ?Nurse Communication: Mobility status;Need for lift equipment ?PT Visit Diagnosis: Unsteadiness on feet (R26.81);History of falling (Z91.81);Muscle weakness (generalized) (M62.81);Other symptoms and signs involving the nervous system (R29.898);Difficulty in walking, not elsewhere classified (R26.2);Ataxic gait (R26.0) ?  ? ? ?Time: [id number] ?PT Time Calculation (min) (ACUTE ONLY): 23 min ? ?Charges:  $Therapeutic Activity: 8-22 mins          ?          ? ?Zenaida Niece, PT, DPT ?Acute Rehabilitation ?Pager: [phone] ?Office [phone] ? ? ? ?Zenaida Niece ?07/25/2021, 11:29 AM ? ?

## 2021-07-25 NOTE — Progress Notes (Signed)
[facility] ? ?Echo shows improved RV function ?Working with PT ?Still slightly confused ?Will increase metoprolol ?Will transfer to floor, will ask TRH to follow for consult ? ?Heber Bethel, MD ?[facility] ?Pager: [phone] ?Cell: [phone] ?After 7:00 pm call Elink  [phone] ? ?

## 2021-07-25 NOTE — Progress Notes (Signed)
NAME:  Brian [name], MRN:  [id number], DOB:  17-Nov-1960, LOS: 26 ADMISSION DATE:  06/29/2021, CONSULTATION DATE:  2/7 REFERRING MD:  Reinaldo Meeker, CHIEF COMPLAINT:  Confusion   History of Present Illness:  61 year old male with prior history of tobacco abuse and ETOH use who recently was found to have critical multilevel cervical spinal stenosis C3-6 with spinal cord compression and spinal cord signal change after recent fall with progressive upper and lower extremity weakness and tremors.  He was admitted on 2/3 and underwent ACDF of C3-4, C4-5, C5-5, and C6-7.  Post-operatively he was progressing slowly with residual weakness in both hands but improving lower extremity strength and function.  He developed some difficulty swallowing on 2/5 and SLP ordered and placed on thickened liquids.   Overnight 2/6, patient with increased difficulty swallowing and was made NPO but also noted to have dysarthria and difficulty moving extremities with numbness.  SLP evaluated and found to be moderate aspiration risk.   Also progressively tachycardic, tachypneic, and now febrile.  Code stroke activated and taken for CT/ CTA head and neck and was given decadron 15m once.  NIHSS 18.  CTH was negative for acute findings, and CTA head neck did not reveal any LVO but noted significant prevertebral soft tissue swelling with foci of gas present at the ventral epidural space at C4-5, small collection not excluded.  On 2/7, patient with worsening confusion and concern for airway involvement, PCCM consulted for further evaluation.   Pertinent  Medical History  Tobacco abuse, cervical stenosis, ETOH use  Significant Hospital Events: Including procedures, antibiotic start and stop dates in addition to other pertinent events   2/3 ACDF C3-4, C4-5, C5-5, and C6-7 w/ Dr. PAnnette Stable2/6 SLP eval for difficulty swallowing 2/7 Code stroke overnight, neg for LVO, CT showing soft tissue swelling.  PCCM consulted for concern of airway  management and AMS.  Went for evacuation of hematoma  2/18 CT Abd/Pelvis: showing bilateral segmental Pes, started on heparin and showing worsening pneumonia, febrile to  103.  PCT rising, 34.6, restarted on abx 2/19 possible aspiration w/ severe hypoxia; intubated; Switching from heparin to angiomax given subtherapeutic levels despite increase in rate. 2/20 Echo shows normal LV systolic function. There was notation of a + McConnell's sign c/w a large pulmonary embolus which is consistent with the finding of bilateral segmental pulmonary emboli noted on CT 07/14/2021. 2/21 rash appreciated on back; stopped cefepim/vanc switched to zosyn; required low dose levo with increase in fentanyl 2/22 remains intubated; unable to extubate to do increase RR and thick secretions 2/23 [name] infusion added 2/24: weaning fentanyl.  2/26 extubated 2/28 tachycardia persists,   Images  2/7 CT angio head and neck > No large vessel occlusion, hemodynamically significant stenosis, or evidence of dissection. Significant prevertebral soft tissue swelling. Foci of gas are present the ventral epidural space at C4-C5; a small collection is not excluded. 2/18 CT angiogram chest/ab/pelvis> no acute intra-abdominal findings, bilateral segmental pulmonary embolism 2/20 echo > RV overload, normal LVEF 2/28 RUQ ultrasound > trace bilateral effusions, otherwise unremarkable study  Abx: 2/18 Cefepime > 2/20 2/18 Flagyl 2/19  2/18 vanc > 2/21 2/21 Zosyn > 2/22   Interim History / Subjective:   Remains tachycardic Somewhat confused but overall mental status seems to be improving, hasn't required sedatives Oxygenation improved Hard collar off   Objective   Blood pressure 134/90, pulse (!) 114, temperature 99 F (37.2 C), temperature source Axillary, resp. rate (!) 34, height 5' 10"  (  1.778 m), weight 52.6 kg, SpO2 96 %.        Intake/Output Summary (Last 24 hours) at 07/25/2021 0724 Last data filed at 07/25/2021  0600 Gross per 24 hour  Intake 817.68 ml  Output 1435 ml  Net -617.32 ml   Filed Weights   07/23/21 0409 07/23/21 1330 07/25/21 0500  Weight: 57.8 kg 57.8 kg 52.6 kg    Examination:  General:  Resting comfortably in bed HENT: NCAT OP clear PULM: CTA B, normal effort CV: tachycardic, regular, no mgr GI: BS+, soft, nontender MSK: normal bulk and tone Neuro: awake, alert but remains confused, [facility] Problem list   Septic shock secondary to aspiration pneumonia  Assessment & Plan:  Acute respiratory failure with hypoxemia in setting of aspiration pneumonia> improved Aspiration precautions  Bilateral segmental PE Sinus Tachycardia with rare PVC Continue eliquis Repeat echocardiogram > is RV function worsening? Stop doxazosin Continue metoprolol 54m bid for now Bolus 500cc LR now and monitor heart rate response  Severe cervical spinal stenosis with cord compression, s/p ACDF CE5-2on 2/3, complicated by epidural hematoma s/p evacuation 2/7 Hyperactive delirium> slightly improved EtOH abuse at baseline Could he have precedex  Consider clonidine if echo OK Continue clonzepam Continue seroquel  Elevated Alk phos with elevated AST/ALT, uncertain etiology, no obvious biliary or hepatic pathology on RUQ ultrasound, intraparenchymal process? Check serum GGT  EtOH abuse Drug abuse Tobacco abuse Counsel to quit  Constipation Protein calorie malnutrition, moderate Dysphagia Tube feeding, stool softeners  Normocytic anemia Monitor for bleeding Transfuse PRBC for Hgb < 7 gm/dL  Urinary retention Stop doxazosin Continue bethanecol  Best Practice (right click and "Reselect all SmartList Selections" daily)   Diet/type: tubefeeds DVT prophylaxis: othereliquis GI prophylaxis: N/A Lines: N/A Foley:  Yes, and it is no longer needed Code Status:  full code Last date of multidisciplinary goals of care discussion [2/27 tried to call son TDarnelle Maffucci no  answer]  Labs   CBC: Recent Labs  Lab 07/20/21 0341 07/21/21 0330 07/22/21 0128 07/23/21 0451 07/24/21 0231  WBC 14.3* 13.7* 13.3* 12.9* 13.8*  NEUTROABS  --   --   --   --  8.7*  HGB 8.6* 8.5* 7.9* 9.1* 9.6*  HCT 26.7* 26.3* 23.5* 26.7* 29.2*  MCV 97.8 97.4 95.9 94.0 95.1  PLT 364 374 360 459* 567*    Basic Metabolic Panel: Recent Labs  Lab 07/19/21 0623 07/19/21 1437 07/20/21 0341 07/21/21 0330 07/22/21 0128 07/23/21 0451 07/24/21 0231  NA 140   < > 140 141   141 138 137 138  K 3.7   < > 3.8 3.7   3.7 4.0 3.5 3.5  CL 108  --  108 105   105 103 97* 100  CO2 25  --  27 30   29 29 31 29   GLUCOSE 96  --  139* 121*   120* 131* 114* 102*  BUN 16  --  12 14   12 17 15 19   CREATININE 0.74  --  0.66 0.69   0.69 0.55* 0.62 0.64  CALCIUM 8.8*  --  9.0 9.0   8.9 8.8* 9.2 9.3  MG 1.7  --   --  1.6*  --   --   --   PHOS 2.7  --  2.3* 2.9   3.0 3.2 2.3* 2.6   < > = values in this interval not displayed.   GFR: Estimated Creatinine Clearance: 73.1 mL/min (by C-G formula based on SCr of  0.64 mg/dL). Recent Labs  Lab 07/21/21 0330 07/22/21 0128 07/23/21 0451 07/24/21 0231  WBC 13.7* 13.3* 12.9* 13.8*    Liver Function Tests: Recent Labs  Lab 07/20/21 0341 07/21/21 0330 07/22/21 0128 07/23/21 0451 07/24/21 0231  AST  --   --   --   --  116*  ALT  --   --   --   --  93*  ALKPHOS  --   --   --   --  349*  BILITOT  --   --   --   --  0.5  PROT  --   --   --   --  7.9  ALBUMIN <1.5* <1.5* <1.5* 1.7* 2.0*   No results for input(s): LIPASE, AMYLASE in the last 168 hours. Recent Labs  Lab 07/24/21 1030  AMMONIA 26    ABG    Component Value Date/Time   PHART 7.450 07/19/2021 1437   PCO2ART 34.6 07/19/2021 1437   PO2ART 51 (L) 07/19/2021 1437   HCO3 24.1 07/19/2021 1437   TCO2 25 07/19/2021 1437   ACIDBASEDEF 2.0 07/17/2021 1641   O2SAT 88 07/19/2021 1437     Coagulation Profile: No results for input(s): INR, PROTIME in the last 168 hours.  Cardiac  Enzymes: No results for input(s): CKTOTAL, CKMB, CKMBINDEX, TROPONINI in the last 168 hours.  HbA1C: Hgb A1c MFr Bld  Date/Time Value Ref Range Status  07/05/2021 03:39 AM 5.2 4.8 - 5.6 % Final    Comment:    (NOTE) Pre diabetes:          5.7%-6.4%  Diabetes:              >6.4%  Glycemic control for   <7.0% adults with diabetes     CBG: Recent Labs  Lab 07/24/21 1127 07/24/21 1535 07/24/21 2008 07/24/21 2358 07/25/21 0359  GLUCAP 115* 114* 122* 111* 115*    Critical care time: 32 minutes    Roselie Awkward, MD [facility] Pager: [phone] Cell: [phone] After 7:00 pm call Elink  [phone]

## 2021-07-26 ENCOUNTER — Inpatient Hospital Stay (HOSPITAL_COMMUNITY): Payer: 59

## 2021-07-26 DIAGNOSIS — R339 Retention of urine, unspecified: Secondary | ICD-10-CM | POA: Diagnosis present

## 2021-07-26 DIAGNOSIS — G959 Disease of spinal cord, unspecified: Secondary | ICD-10-CM | POA: Diagnosis not present

## 2021-07-26 LAB — GLUCOSE, CAPILLARY
Glucose-Capillary: 113 mg/dL — ABNORMAL HIGH (ref 70–99)
Glucose-Capillary: 123 mg/dL — ABNORMAL HIGH (ref 70–99)
Glucose-Capillary: 98 mg/dL (ref 70–99)
Glucose-Capillary: 106 mg/dL — ABNORMAL HIGH (ref 70–99)
Glucose-Capillary: 110 mg/dL — ABNORMAL HIGH (ref 70–99)

## 2021-07-26 LAB — RENAL FUNCTION PANEL
Albumin: 2.2 g/dL — ABNORMAL LOW (ref 3.5–5.0)
Anion gap: 10 (ref 5–15)
BUN: 20 mg/dL (ref 6–20)
CO2: 21 mmol/L — ABNORMAL LOW (ref 22–32)
Calcium: 9.4 mg/dL (ref 8.9–10.3)
Chloride: 109 mmol/L (ref 98–111)
Creatinine, Ser: 0.59 mg/dL — ABNORMAL LOW (ref 0.61–1.24)
GFR, Estimated: >60 mL/min (ref >60)
Glucose, Bld: 110 mg/dL — ABNORMAL HIGH (ref 70–99)
Phosphorus: 3.2 mg/dL (ref 2.5–4.6)
Potassium: 3.9 mmol/L (ref 3.5–5.1)
Sodium: 140 mmol/L (ref 135–145)

## 2021-07-26 LAB — MAGNESIUM: Magnesium: 1.9 mg/dL (ref 1.7–2.4)

## 2021-07-26 MED ORDER — METOPROLOL TARTRATE 25 MG/10 ML ORAL SUSPENSION
75.0000 mg | Freq: Two times a day (BID) | ORAL | Status: DC
  Administered 2021-07-26 – 2021-07-27 (×3): 75 mg
  Filled 2021-07-26 (×4): qty 30

## 2021-07-26 MED ORDER — METOPROLOL TARTRATE 25 MG/10 ML ORAL SUSPENSION
75.0000 mg | Freq: Two times a day (BID) | ORAL | Status: DC
  Filled 2021-07-26: qty 30

## 2021-07-26 NOTE — Progress Notes (Signed)
Modified Barium Swallow Progress Note ? ?Patient Details  ?Name: Brian [name] ?MRN: [id number] ?Date of Birth: 17-Apr-1961 ? ?Today's Date: 07/26/2021 ? ?Modified Barium Swallow completed.  Full report located under Chart Review in the Imaging Section. ? ?Brief recommendations include the following: ? ?Clinical Impression ? Pt presents with oropharyngeal dysphagia which is notably worse compared to the MBS on 2/10. The impact of mentation and prolonged intubation on this is considered. Prevertebral edema is further improved compared to the MBS on 2/10 and a cortrak was present during both studies so this is likely not a significant factor in worsened function. Oral holding, weak bolus manipulation, reduced lingual retraction, and reduced anterior laryngeal movement were noted. He demonstrated lingual residue, vallecular residue, pyriform sinus residue, and reduced epiglottic inversion which resulted in reduced airway protection.  In the absence of a liquid wash, pt was unable to transport puree and dysphagia 3 boluses past the valleculae. He exhibited penetration (PAS 3, 5) and subsequent silent aspiration (PAS 8) with all liquids via tsp, cup and straw. The aspirate often originated from penetrate on the laryngeal surface of the epiglottis or from pyriform sinus residue after the swallow. Despite frequency of aspiration, no throat clearing/coughing was noted, and prompted coughing was ineffective. Airway protection and laryngeal invasion were improved with a left head turn posture. This was able to eliminate penetration and aspiration with honey thick and nectar thick liquids via cup. However, pt exhibited significant difficulty demonstrating and maintaining this despite consistent use of verbal and/or tactile cues. It is recommended that the pt's NPO status be maintained at this time, but that ice chips continue to be allowed after thorough oral care. SLP will follow pt for dysphagia treatment. ?  ?Swallow  Evaluation Recommendations ? ?   ? ? SLP Diet Recommendations: NPO; ice chips allowed after oral care ? ?   ? ? Medication Administration: Via alternative means ? ?   ? ?   ? ?   ? ? Oral Care Recommendations: Oral care QID ? ?   ? ? ?Cannan Beeck I. Hardin Negus, [place], CCC-SLP ?[facility] Services ?Office number [phone] ?Pager [phone] ? ?Horton Marshall ?07/26/2021,10:18 AM ?

## 2021-07-26 NOTE — Progress Notes (Signed)
Providing Compassionate, Quality Care - Together   Subjective: Patient reports he's "not in much pain."  Objective: Vital signs in last 24 hours: Temp:  [97.9 F (36.6 C)-99.8 F (37.7 C)] 98 F (36.7 C) (03/02 0728) Pulse Rate:  [106-126] 108 (03/02 0728) Resp:  [19-41] 20 (03/02 0728) BP: (119-155)/(85-105) 155/105 (03/02 0728) SpO2:  [91 %-99 %] 97 % (03/02 0728) Weight:  [50.6 kg] 50.6 kg (03/02 0500)  Intake/Output from previous day: 03/01 0701 - 03/02 0700 In: 566 [I.V.:566] Out: 1525 [Urine:925; Stool:600] Intake/Output this shift: No intake/output data recorded.  Alert, pleasantly confused MAE, generalized weakness, with weakness most notable in RLE Incision is healing well. Site is clean, dry, and intact.  Lab Results: Recent Labs    07/24/21 0231  WBC 13.8*  HGB 9.6*  HCT 29.2*  PLT 567*   BMET Recent Labs    07/25/21 0758 07/26/21 0334  NA 139   139 140  K 3.2*   3.2* 3.9  CL 107   107 109  CO2 23   22 21*  GLUCOSE 124*   127* 110*  BUN 22*   22* 20  CREATININE 0.67   0.65 0.59*  CALCIUM 9.4   9.4 9.4    Studies/Results: DG Swallowing Func-Speech Pathology  Result Date: 07/26/2021 Table formatting from the original result was not included. Objective Swallowing Evaluation: Type of Study: MBS-Modified Barium Swallow Study  Patient Details Name: Brian [name] MRN: [id number] Date of Birth: March 20, 1961 Today's Date: 07/26/2021 Time: SLP Start Time (ACUTE ONLY): 8921 -SLP Stop Time (ACUTE ONLY): 0905 SLP Time Calculation (min) (ACUTE ONLY): 30 min Past Medical History: No past medical history on file. Past Surgical History: Past Surgical History: Procedure Laterality Date  ANTERIOR CERVICAL DECOMPRESSION/DISCECTOMY FUSION 4 LEVELS N/A 06/29/2021  Procedure: Anterior Cervical Discectomy Fusion - Cervical Three-Cervical Four - Cervical Four- Cervical Five - Cervical Five- Cervical Six - Cervical Six- Cervical Seven;  Surgeon: Julio Sicks, MD;  Location: [facility]  OR;  Service: Neurosurgery;  Laterality: N/A;  HEMATOMA EVACUATION N/A 07/03/2021  Procedure: EXPLORATION OF NECK AND REMOVE BLOOD CLOT;  Surgeon: Julio Sicks, MD;  Location: [facility] OR;  Service: Neurosurgery;  Laterality: N/A; HPI: Pt is a 61 y/o admitted 06/29/21 following C3-7 ACDF after progressive cervical myelopathy symptoms with subsequent fall and severe incomplete SCI. Significant prevertebral swelling per imaging. Pt developed some difficulty swallowing on 2/5 and SLP consulted. MBS 2/6 revealed severe edema s/p ACDF, oral holding, a pharyngeal delay, inconsistent hyolaryngeal elevation, incomplete epiglottic inversion due to edema. Aspiration noted accross consistencies and and NPO status was recommended. Repeat MBS 2/10 showed significantly reduced pharyngeal edema and a dysphagia 2 diet with nectar thick liquids was initiated. Pt transitioned to thin liquids 2/16 and susbequently returned to nectar thick 2/17 due to concern for aspiration. Pt developed pna, possible aspiration, requiring intubation 2/19-26. Cortrak placed 2/20. No significant PMH.  No data recorded  Recommendations for follow up therapy are one component of a multi-disciplinary discharge planning process, led by the attending physician.  Recommendations may be updated based on patient status, additional functional criteria and insurance authorization. Assessment / Plan / Recommendation Clinical Impressions 07/26/2021 Clinical Impression Pt presents with oropharyngeal dysphagia which is notably worse compared to the MBS on 2/10. The impact of mentation and prolonged intubation on this is considered. Prevertebral edema is further improved compared to the MBS on 2/10 and a cortrak was present during both studies so this is likely not a significant factor in  worsened function. Oral holding, weak bolus manipulation, reduced lingual retraction, and reduced anterior laryngeal movement were noted. He demonstrated lingual residue, vallecular residue,  pyriform sinus residue, and reduced epiglottic inversion which resulted in reduced airway protection.  In the absence of a liquid wash, pt was unable to transport puree and dysphagia 3 boluses past the valleculae. He exhibited penetration (PAS 3, 5) and subsequent silent aspiration (PAS 8) with all liquids via tsp, cup and straw. The aspirate often originated from penetrate on the laryngeal surface of the epiglottis or from pyriform sinus residue after the swallow. Despite frequency of aspiration, no throat clearing/coughing was noted, and prompted coughing was ineffective. Airway protection and laryngeal invasion were improved with a left head turn posture. This was able to eliminate penetration and aspiration with honey thick and nectar thick liquids via cup. However, pt exhibited significant difficulty demonstrating and maintaining this despite consistent use of verbal and/or tactile cues. It is recommended that the pt's NPO status be maintained at this time, but that ice chips continue to be allowed after thorough oral care. SLP will follow pt for dysphagia treatment. SLP Visit Diagnosis Dysphagia, oropharyngeal phase (R13.12) Attention and concentration deficit following -- Frontal lobe and executive function deficit following -- Impact on safety and function Severe aspiration risk   Treatment Recommendations 07/26/2021 Treatment Recommendations Therapy as outlined in treatment plan below   Prognosis 07/26/2021 Prognosis for Safe Diet Advancement Good Barriers to Reach Goals Cognitive deficits Barriers/Prognosis Comment -- Diet Recommendations 07/26/2021 SLP Diet Recommendations NPO Liquid Administration via -- Medication Administration Via alternative means Compensations -- Postural Changes --   Other Recommendations 07/26/2021 Recommended Consults -- Oral Care Recommendations Oral care QID Other Recommendations -- Follow Up Recommendations Acute inpatient rehab (3hours/day) Assistance recommended at discharge --  Functional Status Assessment Patient has had a recent decline in their functional status and demonstrates the ability to make significant improvements in function in a reasonable and predictable amount of time. Frequency and Duration  07/26/2021 Speech Therapy Frequency (ACUTE ONLY) min 2x/week Treatment Duration 2 weeks   Oral Phase 07/26/2021 Oral Phase Impaired Oral - Pudding Teaspoon -- Oral - Pudding Cup -- Oral - Honey Teaspoon -- Oral - Honey Cup -- Oral - Nectar Teaspoon Holding of bolus;Delayed oral transit Oral - Nectar Cup Holding of bolus;Delayed oral transit Oral - Nectar Straw -- Oral - Thin Teaspoon NT Oral - Thin Cup Decreased velopharyngeal closure Oral - Thin Straw Holding of bolus;Delayed oral transit Oral - Puree Holding of bolus;Delayed oral transit Oral - Mech Soft Holding of bolus;Delayed oral transit;Impaired mastication Oral - Regular NT Oral - Multi-Consistency -- Oral - Pill -- Oral Phase - Comment --  Pharyngeal Phase 07/26/2021 Pharyngeal Phase Impaired Pharyngeal- Pudding Teaspoon -- Pharyngeal -- Pharyngeal- Pudding Cup -- Pharyngeal -- Pharyngeal- Honey Teaspoon -- Pharyngeal -- Pharyngeal- Honey Cup -- Pharyngeal -- Pharyngeal- Nectar Teaspoon Pharyngeal residue - valleculae;Pharyngeal residue - pyriform;Reduced epiglottic inversion;Reduced anterior laryngeal mobility;Reduced airway/laryngeal closure;Reduced tongue base retraction Pharyngeal Material enters airway, remains ABOVE vocal cords and not ejected out;Material enters airway, CONTACTS cords and not ejected out;Material enters airway, passes BELOW cords without attempt by patient to eject out (silent aspiration) Pharyngeal- Nectar Cup Pharyngeal residue - valleculae;Pharyngeal residue - pyriform;Reduced epiglottic inversion;Reduced anterior laryngeal mobility;Reduced airway/laryngeal closure;Reduced tongue base retraction Pharyngeal Material enters airway, remains ABOVE vocal cords and not ejected out;Material enters airway,  CONTACTS cords and not ejected out;Material enters airway, passes BELOW cords without attempt by patient to eject out (silent aspiration) Pharyngeal-  Nectar Straw Pharyngeal residue - valleculae;Pharyngeal residue - pyriform;Reduced epiglottic inversion;Reduced anterior laryngeal mobility;Reduced airway/laryngeal closure;Reduced tongue base retraction Pharyngeal Material enters airway, CONTACTS cords and not ejected out;Material enters airway, passes BELOW cords without attempt by patient to eject out (silent aspiration) Pharyngeal- Thin Teaspoon NT Pharyngeal -- Pharyngeal- Thin Cup Pharyngeal residue - valleculae;Pharyngeal residue - pyriform;Reduced epiglottic inversion;Reduced anterior laryngeal mobility;Reduced airway/laryngeal closure;Reduced tongue base retraction Pharyngeal Material enters airway, remains ABOVE vocal cords and not ejected out;Material enters airway, CONTACTS cords and not ejected out;Material enters airway, passes BELOW cords without attempt by patient to eject out (silent aspiration) Pharyngeal- Thin Straw Pharyngeal residue - valleculae;Pharyngeal residue - pyriform;Reduced epiglottic inversion;Reduced anterior laryngeal mobility;Reduced airway/laryngeal closure;Reduced tongue base retraction Pharyngeal Material enters airway, remains ABOVE vocal cords and not ejected out Pharyngeal- Puree Pharyngeal residue - valleculae;Reduced epiglottic inversion;Reduced anterior laryngeal mobility;Reduced airway/laryngeal closure;Reduced tongue base retraction Pharyngeal -- Pharyngeal- Mechanical Soft Pharyngeal residue - valleculae;Reduced epiglottic inversion;Reduced anterior laryngeal mobility;Reduced airway/laryngeal closure;Reduced tongue base retraction Pharyngeal -- Pharyngeal- Regular NT Pharyngeal -- Pharyngeal- Multi-consistency NT Pharyngeal -- Pharyngeal- Pill NT Pharyngeal -- Pharyngeal Comment --  Cervical Esophageal Phase  07/26/2021 Cervical Esophageal Phase WFL Pudding Teaspoon --  Pudding Cup -- Honey Teaspoon -- Honey Cup -- Nectar Teaspoon -- Nectar Cup -- Nectar Straw -- Thin Teaspoon -- Thin Cup -- Thin Straw -- Puree -- Mechanical Soft -- Regular -- Multi-consistency -- Pill -- Cervical Esophageal Comment -- Shanika I. Vear ClockPhillips, MS, CCC-SLP Acute Rehabilitation Services Office number [phone] Pager [phone] Scheryl MartenShanika I Phillips 07/26/2021, 10:25 AM                     ECHOCARDIOGRAM LIMITED  Result Date: 07/25/2021    ECHOCARDIOGRAM LIMITED REPORT   Patient Name:   Brian [name] Date of Exam: 07/25/2021 Medical Rec #:  [id number]        Height:       70.0 in Accession #:    [id number]       Weight:       116.0 lb Date of Birth:  25-Mar-1961       BSA:          1.656 m Patient Age:    60 years         BP:           137/87 mmHg Patient Gender: M                HR:           110 bpm. Exam Location:  Inpatient Procedure: Limited Echo Indications:    Pulmonary embolus  History:        Patient has prior history of Echocardiogram examinations, most                 recent 07/16/2021.  Sonographer:    Eduard Rouxolleen Schwartz Referring Phys: [phone] DOUGLAS B MCQUAID IMPRESSIONS  1. Left ventricular ejection fraction, by estimation, is 60 to 65%. The left ventricle has normal function.  2. The RV size and contractility appear normal. No evidence of McConnell's sign. The IVC is small and collapses with inspiration. . Right ventricular systolic function is normal. The right ventricular size is normal.  3. The aortic valve is tricuspid. Aortic valve regurgitation is not visualized. No aortic stenosis is present. FINDINGS  Left Ventricle: Left ventricular ejection fraction, by estimation, is 60 to 65%. The left ventricle has normal function. The left ventricular internal cavity size was normal in size. Right Ventricle: The  RV size and contractility appear normal. No evidence of McConnell's sign. The IVC is small and collapses with inspiration. The right ventricular size is normal. Right vetricular wall  thickness was not well visualized. Right ventricular systolic function is normal. Aortic Valve: The aortic valve is tricuspid. Aortic valve regurgitation is not visualized. No aortic stenosis is present. Aorta: The aortic root and ascending aorta are structurally normal, with no evidence of dilitation. LEFT VENTRICLE PLAX 2D LVIDd:         4.30 cm LVIDs:         2.30 cm LV PW:         1.00 cm LV IVS:        0.90 cm LVOT diam:     2.10 cm LVOT Area:     3.46 cm  IVC IVC diam: 1.50 cm LEFT ATRIUM           Index        RIGHT ATRIUM           Index LA diam:      2.60 cm 1.57 cm/m   RA Area:     10.50 cm LA Vol (A2C): 46.5 ml 28.08 ml/m  RA Volume:   20.80 ml  12.56 ml/m   AORTA Ao Root diam: 3.50 cm Ao Asc diam:  3.30 cm  SHUNTS Systemic Diam: 2.10 cm Kristeen Miss MD Electronically signed by Kristeen Miss MD Signature Date/Time: 07/25/2021/4:31:29 PM    Final    US Abdomen Limited RUQ (LIVER/GB)  Result Date: 07/24/2021 CLINICAL DATA:  Abnormal liver function tests. EXAM: ULTRASOUND ABDOMEN LIMITED RIGHT UPPER QUADRANT COMPARISON:  None. FINDINGS: Gallbladder: No gallstones or wall thickening visualized. No sonographic Murphy sign noted by sonographer. Common bile duct: Diameter: 3 mm. Liver: No focal lesion identified. Within normal limits in parenchymal echogenicity. Portal vein is patent on color Doppler imaging with normal direction of blood flow towards the liver. Other: At least trace bilateral pleural effusions. IMPRESSION: 1. At least bilateral trace pleural effusions. 2. Otherwise unremarkable right upper quadrant ultrasound. Electronically Signed   By: Tish Frederickson M.D.   On: 07/24/2021 15:19    Assessment/Plan: Patient status post C3-4, C4-5, C5-6, C6-7 anterior cervical discectomy with interbody fusion by Dr. Jordan Likes on 06/29/2021. Increased difficulty swallowing with lung atelectasis and elevated temperatures on 07/02/2021. Made NPO following MBS by SLP. Patient's neuro exam declined 07/03/2021 and he  was found to be quadriparetic at shift change. CTA head and neck negative for stroke. MRI revealed epidural hematoma. Patient underwent exploration of his cervical fusion with evacuation of the epidural hematoma on 07/03/2021. Cortrak was placed on 07/04/2021. Patient's strength much improved since epidural hematoma evacuation. Cortrak removed 07/06/2021 and dysphagia 2 diet with nectar thick liquids started. Patient with delirium vs ETOH withdrawal. Discontinued steroids 07/06/2021. CIWA protocol started and discontinued 07/07/2021. CT and MRI 07/07/2021 negative. Patient developed respiratory distress, requiring intubation on 07/15/2021. Work up revealed aspiration PNA and PE. Patient was extubated on 07/22/2021.   LOS: 27 days   -Continue to mobilize with therapies -Recommendation appears to be SNF  Val Eagle, DNP, AGNP-C Nurse Practitioner  [facility] [address], [place], [place] [address] P: [phone]     F: [phone]  07/26/2021, 12:25 PM

## 2021-07-26 NOTE — Progress Notes (Signed)
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  He was admitted on 2/3 and underwent ACDF of C3-4, C4-5, C5-5, and C6-7.  Post-operatively he was progressing slowly with residual weakness in both hands but improving lower extremity strength and function.  He developed some difficulty swallowing on 2/5 and SLP ordered and placed on thickened liquids.   Overnight 2/6, patient with increased difficulty swallowing and was made NPO but also noted to have dysarthria and difficulty moving extremities with numbness.  SLP evaluated and found to be moderate aspiration risk.   Also progressively tachycardic, tachypneic, and now febrile.  Code stroke activated and taken for CT/ CTA head and neck and was given decadron 51m once.  NIHSS 18.  CTH was negative for acute findings, and CTA head neck did not reveal any LVO but noted significant prevertebral soft tissue swelling with foci of gas present at the ventral epidural space at C4-5, small collection not excluded. On 2/7, patient with worsening confusion and concern for airway involvement, PCCM consulted and remained in the intensive care unit until he is transferred to the floor on 3/2 and [facility] under [facility] service.  Significant Hospital Events: 2/3 ACDF C3-4, C4-5, C5-5, and C6-7 w/ Dr. PAnnette Stable2/6 SLP eval for difficulty swallowing 2/7 Code stroke overnight, neg for LVO, CT showing soft tissue swelling.  PCCM consulted for concern of airway  management and AMS.  Went for evacuation of hematoma  2/18 CT Abd/Pelvis: showing bilateral segmental Pes, started on heparin and showing worsening pneumonia, febrile to  103.  PCT rising, 34.6, restarted on abx 2/19 possible aspiration w/ severe hypoxia; intubated; Switching from heparin to angiomax given subtherapeutic levels despite increase in rate. 2/20 Echo shows normal LV systolic function. There was notation of a + McConnell's sign c/w a large pulmonary embolus which is consistent with the finding of bilateral segmental pulmonary emboli noted on CT 07/14/2021. 2/21 rash appreciated on back; stopped cefepim/vanc switched to zosyn; required low dose levo with increase in fentanyl 2/22 remains intubated; unable to extubate to do increase RR and thick secretions 2/23 [name] infusion added 2/24: weaning fentanyl.  2/26 extubated 2/28 tachycardia persists 3/2: Modified barium swallow, continues n.p.o. with core track in place, likely will need PEG tube.     Assessment & Plan:   Assessment and Plan: * Cervical myelopathy ([place]; severe cervical spinal stenosis with cord compression s/p ACDF CU2-7on 2/3, complicated by epidural hematoma s/p evacuation 2/7 Patient initially presenting to ED after recent fall with progressive upper and lower extremity weakness and tremors, was found to have critical multilevel cervical spinal stenosis C3-3 6 with spinal cord compression and spinal cord signal change and underwent ACDF C3-4, C4-5, C5-5, and C6/7 by neurosurgery Dr. PTrenton Gammonon 06/29/2021.  Postoperative leg complicated by epidural hematoma s/p evacuation on 2/7. --Continue PT/OT/SLP efforts --Further per primary neurosurgery  Acute respiratory failure with hypoxia ([place] Etiology likely multifactorial with significant dysphagia with recurrent aspiration pneumonia events during initial hospitalization following ACDF surgery with postoperative complication of  cervical hematoma.  Patient did  require ventilatory support in the intensive care unit and was successfully extubated on 07/22/2021.  Patient completed extensive course of empiric antibiotics.   --Aspiration precautions --Continue supplemental oxygen, maintain SPO2 greater than 92%, currently on 3 L nasal cannula with SPO2 99% at rest    Acute pulmonary embolism (HCC) Bilateral segmental PE noted on CTA chest.  LE [place] negative for DVT.  TTE with normal LV systolic function, notation of positive McConnell sign consistent with a large pulmonary embolism which is consistent with the finding of bilateral segmental pulmonary emboli at on CT. --Eliquis 5mg  BID --Currently on 3 L nasal cannula, continue to wean for goal SPO2 92% or greater  Acute metabolic encephalopathy CT head, MRI brain, EEG, TSH, B12, ammonia unrevealing.  Etiology likely multifactorial with acute respiratory failure secondary to aspiration pneumonia, bilateral pulmonary embolism.  Completed course of antibiotics. --Seroquel 50 mg BID  Severe sepsis due to recurrent aspiration pneumonia Likely recurrent issue during hospitalization, remains n.p.o. due to continued aspiration risk per SLP.  Completed extensive course of antibiotics while under ICU care. --N.p.o., tube feeds; likely will need PEG --Aspiration precautions  Sinus tachycardia Multifactorial including sepsis, dehydration, PE, agitation.   --Metoprolol tartrate 50mg  BID --Continue to monitor on telemetry   Elevated liver enzymes Etiology likely secondary to sepsis from aspiration pneumonia as above.  Acute hepatitis panel negative.  Right upper quadrant ultrasound with no focal liver lesion, parenchymal with normal echogenicity. --Continue monitor LFTs intermittently  Hyponatremia Etiology likely secondary to dehydration.  Urine sodium low.  Improved with IV fluids.  Sodium now normalized on tube feeds. --Continue intermittent monitoring of CMP  Tobacco use disorder Encouraged  cessation. --Nicotine patch  Physical deconditioning Significant weakness in all extremities as a result of cervical myelopathy and acute illness. --Continue PT/OT efforts, likely will need SNF placement  Urinary retention --Bethanechol --Continue monitor urinary output  Protein-calorie malnutrition, severe ([place]) As evidenced by poor p.o. intake, significant muscle mass and subcu fat loss and significant weight loss (about 16 pounds since this hospitalization). Nutrition Problem: Severe Malnutrition (in the context of social/environmental circumstances) Etiology:  (inadequate energy intake) Signs/Symptoms: mild fat depletion, severe muscle depletion, severe muscle depletion Interventions: Refer to RD note for recommendations  -- Dietitian following, appreciate assistance.  Continues tube feeds via core track tube, likely anticipate need of feeding tube per discussion with SLP.  Hyperglycemia-resolved as of 07/12/2021 Likely due to steroid.  Resolved.  A1c 5.2%.     DVT prophylaxis: SCD's Start: 06/29/21 1231 apixaban (ELIQUIS) tablet 5 mg    Code Status: Full Code Family Communication: No family present at bedside this morning  Disposition Plan:  Level of care: Progressive Status is: Inpatient  Remains inpatient appropriate because: Remains on tube feeds via core track tube, anticipate likely need of PEG tube placement; suspicion per primary neurosurgery.   Procedures:  ACDF C3-7, neurosurgery Dr. Trenton Gammon 2/3 Hematoma evacuation 2/7 Vascular duplex ultrasound bilateral lower extremities 2/19 TTE 2/20 TTE 3/1  Antimicrobials:  Vancomycin 2/7 - 2/8; 2/18 - 2/21 Zosyn 2/21 -2/23 Ampicillin 2/7 -2/11 Metronidazole 2/7 - 2/8; 2/17 -2/18 Cefepime 2/18 - 2/21 Perioperative cefazolin  Subjective: Patient seen examined bedside, resting comfortably.  Barium swallow performed today with continued recommendations of n.p.o. status given continue aspiration per SLP.  No family  present at bedside, remains slightly confused and difficult to understand.  Denies pain.  Perseverates over his continued weakness with lack of significant improvement.  No other complaints or  concerns at this time.  Denies headache, no chest pain, no shortness of breath, no abdominal pain.  No acute concerns overnight per nursing staff.  Objective: Vitals:   07/26/21 0343 07/26/21 0500 07/26/21 0728 07/26/21 1234  BP:   (!) 155/105 (!) 128/98  Pulse:   (!) 108 (!) 104  Resp: $Remo'20  20 20  'ldELX$ Temp: 98.9 F (37.2 C)  98 F (36.7 C) 98.3 F (36.8 C)  TempSrc: Axillary  Axillary Axillary  SpO2:   97% 100%  Weight:  50.6 kg    Height:        Intake/Output Summary (Last 24 hours) at 07/26/2021 1350 Last data filed at 07/26/2021 1247 Gross per 24 hour  Intake 1439.45 ml  Output 1525 ml  Net -85.55 ml   Filed Weights   07/23/21 1330 07/25/21 0500 07/26/21 0500  Weight: 57.8 kg 52.6 kg 50.6 kg    Examination:  Physical Exam: GEN: NAD, alert, slightly confused, cachectic/chronically ill in appearance appears older than stated age HEENT: NCAT, PERRL, EOMI, sclera clear, dry mucous membranes, core track tube noted in place PULM: CTAB w/o wheezes/crackles, normal respiratory effort, on 3 L [place], with SPO2 99% at rest CV: Tachycardic, regular rhythm w/o M/G/R GI: abd soft, NTND, NABS, no R/G/M MSK: no peripheral edema, decreased muscle strength bilateral upper/lower extremities NEURO: Awake, alert, slightly confused, decreased muscle strength bilateral upper/lower extremities PSYCH: normal mood/affect    Data Reviewed: I have personally reviewed following labs and imaging studies  CBC: Recent Labs  Lab 07/20/21 0341 07/21/21 0330 07/22/21 0128 07/23/21 0451 07/24/21 0231  WBC 14.3* 13.7* 13.3* 12.9* 13.8*  NEUTROABS  --   --   --   --  8.7*  HGB 8.6* 8.5* 7.9* 9.1* 9.6*  HCT 26.7* 26.3* 23.5* 26.7* 29.2*  MCV 97.8 97.4 95.9 94.0 95.1  PLT 364 374 360 459* 356*   Basic Metabolic  Panel: Recent Labs  Lab 07/21/21 0330 07/22/21 0128 07/23/21 0451 07/24/21 0231 07/25/21 0758 07/26/21 0334  NA 141   141 138 137 138 139   139 140  K 3.7   3.7 4.0 3.5 3.5 3.2*   3.2* 3.9  CL 105   105 103 97* 100 107   107 109  CO2 $Re'30   29 29 31 29 23   22 'ytN$ 21*  GLUCOSE 121*   120* 131* 114* 102* 124*   127* 110*  BUN $Re'14   12 17 15 19 'XQK$ 22*   22* 20  CREATININE 0.69   0.69 0.55* 0.62 0.64 0.67   0.65 0.59*  CALCIUM 9.0   8.9 8.8* 9.2 9.3 9.4   9.4 9.4  MG 1.6*  --   --   --   --  1.9  PHOS 2.9   3.0 3.2 2.3* 2.6 3.0 3.2   GFR: Estimated Creatinine Clearance: 70.3 mL/min (A) (by C-G formula based on SCr of 0.59 mg/dL (L)). Liver Function Tests: Recent Labs  Lab 07/22/21 0128 07/23/21 0451 07/24/21 0231 07/25/21 0758 07/26/21 0334  AST  --   --  116* 109*  --   ALT  --   --  93* 103*  --   ALKPHOS  --   --  349* 324*  --   BILITOT  --   --  0.5 0.6  --   PROT  --   --  7.9 8.2*  --   ALBUMIN <1.5* 1.7* 2.0* 2.2*   2.2* 2.2*   No results for input(s):  LIPASE, AMYLASE in the last 168 hours. Recent Labs  Lab 07/24/21 1030  AMMONIA 26   Coagulation Profile: No results for input(s): INR, PROTIME in the last 168 hours. Cardiac Enzymes: No results for input(s): CKTOTAL, CKMB, CKMBINDEX, TROPONINI in the last 168 hours. BNP (last 3 results) No results for input(s): PROBNP in the last 8760 hours. HbA1C: No results for input(s): HGBA1C in the last 72 hours. CBG: Recent Labs  Lab 07/25/21 1534 07/25/21 2015 07/25/21 2309 07/26/21 1023 07/26/21 1233  GLUCAP 105* 108* 100* 113* 123*   Lipid Profile: No results for input(s): CHOL, HDL, LDLCALC, TRIG, CHOLHDL, LDLDIRECT in the last 72 hours. Thyroid Function Tests: No results for input(s): TSH, T4TOTAL, FREET4, T3FREE, THYROIDAB in the last 72 hours. Anemia Panel: No results for input(s): VITAMINB12, FOLATE, FERRITIN, TIBC, IRON, RETICCTPCT in the last 72 hours. Sepsis Labs: No results for input(s): PROCALCITON,  LATICACIDVEN in the last 168 hours.  Recent Results (from the past 240 hour(s))  Culture, Respiratory w Gram Stain     Status: None   Collection Time: 07/20/21  1:57 PM   Specimen: Tracheal Aspirate; Respiratory  Result Value Ref Range Status   Specimen Description TRACHEAL ASPIRATE  Final   Special Requests NONE  Final   Gram Stain   Final    NO SQUAMOUS EPITHELIAL CELLS SEEN FEW WBC SEEN NO ORGANISMS SEEN    Culture   Final    NO GROWTH 2 DAYS Performed at [facility], [address] [place],  [address]    Report Status 07/22/2021 FINAL  Final         Radiology Studies: DG Swallowing Func-Speech Pathology  Result Date: 07/26/2021 Table formatting from the original result was not included. Objective Swallowing Evaluation: Type of Study: MBS-Modified Barium Swallow Study  Patient Details Name: ARZELL MCGEEHAN MRN: [id number] Date of Birth: November 25, 1960 Today's Date: 07/26/2021 Time: SLP Start Time (ACUTE ONLY): 0258 -SLP Stop Time (ACUTE ONLY): 0905 SLP Time Calculation (min) (ACUTE ONLY): 30 min Past Medical History: No past medical history on file. Past Surgical History: Past Surgical History: Procedure Laterality Date  ANTERIOR CERVICAL DECOMPRESSION/DISCECTOMY FUSION 4 LEVELS N/A 06/29/2021  Procedure: Anterior Cervical Discectomy Fusion - Cervical Three-Cervical Four - Cervical Four- Cervical Five - Cervical Five- Cervical Six - Cervical Six- Cervical Seven;  Surgeon: Earnie Larsson, MD;  Location: [place];  Service: Neurosurgery;  Laterality: N/A;  HEMATOMA EVACUATION N/A 07/03/2021  Procedure: EXPLORATION OF NECK AND REMOVE BLOOD CLOT;  Surgeon: Earnie Larsson, MD;  Location: [place];  Service: Neurosurgery;  Laterality: N/A; HPI: Pt is a 61 y/o admitted 06/29/21 following C3-7 ACDF after progressive cervical myelopathy symptoms with subsequent fall and severe incomplete SCI. Significant prevertebral swelling per imaging. Pt developed some difficulty swallowing on 2/5 and SLP consulted.  MBS 2/6 revealed severe edema s/p ACDF, oral holding, a pharyngeal delay, inconsistent hyolaryngeal elevation, incomplete epiglottic inversion due to edema. Aspiration noted accross consistencies and and NPO status was recommended. Repeat MBS 2/10 showed significantly reduced pharyngeal edema and a dysphagia 2 diet with nectar thick liquids was initiated. Pt transitioned to thin liquids 2/16 and susbequently returned to nectar thick 2/17 due to concern for aspiration. Pt developed pna, possible aspiration, requiring intubation 2/19-26. Cortrak placed 2/20. No significant PMH.  No data recorded  Recommendations for follow up therapy are one component of a multi-disciplinary discharge planning process, led by the attending physician.  Recommendations may be updated based on patient status, additional functional criteria and  insurance authorization. Assessment / Plan / Recommendation Clinical Impressions 07/26/2021 Clinical Impression Pt presents with oropharyngeal dysphagia which is notably worse compared to the MBS on 2/10. The impact of mentation and prolonged intubation on this is considered. Prevertebral edema is further improved compared to the MBS on 2/10 and a cortrak was present during both studies so this is likely not a significant factor in worsened function. Oral holding, weak bolus manipulation, reduced lingual retraction, and reduced anterior laryngeal movement were noted. He demonstrated lingual residue, vallecular residue, pyriform sinus residue, and reduced epiglottic inversion which resulted in reduced airway protection.  In the absence of a liquid wash, pt was unable to transport puree and dysphagia 3 boluses past the valleculae. He exhibited penetration (PAS 3, 5) and subsequent silent aspiration (PAS 8) with all liquids via tsp, cup and straw. The aspirate often originated from penetrate on the laryngeal surface of the epiglottis or from pyriform sinus residue after the swallow. Despite frequency of  aspiration, no throat clearing/coughing was noted, and prompted coughing was ineffective. Airway protection and laryngeal invasion were improved with a left head turn posture. This was able to eliminate penetration and aspiration with honey thick and nectar thick liquids via cup. However, pt exhibited significant difficulty demonstrating and maintaining this despite consistent use of verbal and/or tactile cues. It is recommended that the pt's NPO status be maintained at this time, but that ice chips continue to be allowed after thorough oral care. SLP will follow pt for dysphagia treatment. SLP Visit Diagnosis Dysphagia, oropharyngeal phase (R13.12) Attention and concentration deficit following -- Frontal lobe and executive function deficit following -- Impact on safety and function Severe aspiration risk   Treatment Recommendations 07/26/2021 Treatment Recommendations Therapy as outlined in treatment plan below   Prognosis 07/26/2021 Prognosis for Safe Diet Advancement Good Barriers to Reach Goals Cognitive deficits Barriers/Prognosis Comment -- Diet Recommendations 07/26/2021 SLP Diet Recommendations NPO Liquid Administration via -- Medication Administration Via alternative means Compensations -- Postural Changes --   Other Recommendations 07/26/2021 Recommended Consults -- Oral Care Recommendations Oral care QID Other Recommendations -- Follow Up Recommendations Acute inpatient rehab (3hours/day) Assistance recommended at discharge -- Functional Status Assessment Patient has had a recent decline in their functional status and demonstrates the ability to make significant improvements in function in a reasonable and predictable amount of time. Frequency and Duration  07/26/2021 Speech Therapy Frequency (ACUTE ONLY) min 2x/week Treatment Duration 2 weeks   Oral Phase 07/26/2021 Oral Phase Impaired Oral - Pudding Teaspoon -- Oral - Pudding Cup -- Oral - Honey Teaspoon -- Oral - Honey Cup -- Oral - Nectar Teaspoon Holding of  bolus;Delayed oral transit Oral - Nectar Cup Holding of bolus;Delayed oral transit Oral - Nectar Straw -- Oral - Thin Teaspoon NT Oral - Thin Cup Decreased velopharyngeal closure Oral - Thin Straw Holding of bolus;Delayed oral transit Oral - Puree Holding of bolus;Delayed oral transit Oral - Mech Soft Holding of bolus;Delayed oral transit;Impaired mastication Oral - Regular NT Oral - Multi-Consistency -- Oral - Pill -- Oral Phase - Comment --  Pharyngeal Phase 07/26/2021 Pharyngeal Phase Impaired Pharyngeal- Pudding Teaspoon -- Pharyngeal -- Pharyngeal- Pudding Cup -- Pharyngeal -- Pharyngeal- Honey Teaspoon -- Pharyngeal -- Pharyngeal- Honey Cup -- Pharyngeal -- Pharyngeal- Nectar Teaspoon Pharyngeal residue - valleculae;Pharyngeal residue - pyriform;Reduced epiglottic inversion;Reduced anterior laryngeal mobility;Reduced airway/laryngeal closure;Reduced tongue base retraction Pharyngeal Material enters airway, remains ABOVE vocal cords and not ejected out;Material enters airway, CONTACTS cords and not ejected out;Material enters airway, passes BELOW  cords without attempt by patient to eject out (silent aspiration) Pharyngeal- Nectar Cup Pharyngeal residue - valleculae;Pharyngeal residue - pyriform;Reduced epiglottic inversion;Reduced anterior laryngeal mobility;Reduced airway/laryngeal closure;Reduced tongue base retraction Pharyngeal Material enters airway, remains ABOVE vocal cords and not ejected out;Material enters airway, CONTACTS cords and not ejected out;Material enters airway, passes BELOW cords without attempt by patient to eject out (silent aspiration) Pharyngeal- Nectar Straw Pharyngeal residue - valleculae;Pharyngeal residue - pyriform;Reduced epiglottic inversion;Reduced anterior laryngeal mobility;Reduced airway/laryngeal closure;Reduced tongue base retraction Pharyngeal Material enters airway, CONTACTS cords and not ejected out;Material enters airway, passes BELOW cords without attempt by patient to  eject out (silent aspiration) Pharyngeal- Thin Teaspoon NT Pharyngeal -- Pharyngeal- Thin Cup Pharyngeal residue - valleculae;Pharyngeal residue - pyriform;Reduced epiglottic inversion;Reduced anterior laryngeal mobility;Reduced airway/laryngeal closure;Reduced tongue base retraction Pharyngeal Material enters airway, remains ABOVE vocal cords and not ejected out;Material enters airway, CONTACTS cords and not ejected out;Material enters airway, passes BELOW cords without attempt by patient to eject out (silent aspiration) Pharyngeal- Thin Straw Pharyngeal residue - valleculae;Pharyngeal residue - pyriform;Reduced epiglottic inversion;Reduced anterior laryngeal mobility;Reduced airway/laryngeal closure;Reduced tongue base retraction Pharyngeal Material enters airway, remains ABOVE vocal cords and not ejected out Pharyngeal- Puree Pharyngeal residue - valleculae;Reduced epiglottic inversion;Reduced anterior laryngeal mobility;Reduced airway/laryngeal closure;Reduced tongue base retraction Pharyngeal -- Pharyngeal- Mechanical Soft Pharyngeal residue - valleculae;Reduced epiglottic inversion;Reduced anterior laryngeal mobility;Reduced airway/laryngeal closure;Reduced tongue base retraction Pharyngeal -- Pharyngeal- Regular NT Pharyngeal -- Pharyngeal- Multi-consistency NT Pharyngeal -- Pharyngeal- Pill NT Pharyngeal -- Pharyngeal Comment --  Cervical Esophageal Phase  07/26/2021 Cervical Esophageal Phase WFL Pudding Teaspoon -- Pudding Cup -- Honey Teaspoon -- Honey Cup -- Nectar Teaspoon -- Nectar Cup -- Nectar Straw -- Thin Teaspoon -- Thin Cup -- Thin Straw -- Puree -- Mechanical Soft -- Regular -- Multi-consistency -- Pill -- Cervical Esophageal Comment -- Shanika I. Hardin Negus, [place] Office number [phone] Pager [phone] Horton Marshall 07/26/2021, 10:25 AM                     ECHOCARDIOGRAM LIMITED  Result Date: 07/25/2021    ECHOCARDIOGRAM LIMITED REPORT   Patient Name:    ZACKAREY HOLLEMAN Date of Exam: 07/25/2021 Medical Rec #:  [id number]        Height:       70.0 in Accession #:    [id number]       Weight:       116.0 lb Date of Birth:  1960-11-03       BSA:          1.656 m Patient Age:    29 years         BP:           137/87 mmHg Patient Gender: M                HR:           110 bpm. Exam Location:  Inpatient Procedure: Limited Echo Indications:    Pulmonary embolus  History:        Patient has prior history of Echocardiogram examinations, most                 recent 07/16/2021.  Sonographer:    Jefferey Pica Referring Phys: [place]  1. Left ventricular ejection fraction, by estimation, is 60 to 65%. The left ventricle has normal function.  2. The RV size and contractility appear normal. No evidence of McConnell's sign. The IVC is small and collapses  with inspiration. . Right ventricular systolic function is normal. The right ventricular size is normal.  3. The aortic valve is tricuspid. Aortic valve regurgitation is not visualized. No aortic stenosis is present. FINDINGS  Left Ventricle: Left ventricular ejection fraction, by estimation, is 60 to 65%. The left ventricle has normal function. The left ventricular internal cavity size was normal in size. Right Ventricle: The RV size and contractility appear normal. No evidence of McConnell's sign. The IVC is small and collapses with inspiration. The right ventricular size is normal. Right vetricular wall thickness was not well visualized. Right ventricular systolic function is normal. Aortic Valve: The aortic valve is tricuspid. Aortic valve regurgitation is not visualized. No aortic stenosis is present. Aorta: The aortic root and ascending aorta are structurally normal, with no evidence of dilitation. LEFT VENTRICLE PLAX 2D LVIDd:         4.30 cm LVIDs:         2.30 cm LV PW:         1.00 cm LV IVS:        0.90 cm LVOT diam:     2.10 cm LVOT Area:     3.46 cm  IVC IVC diam: 1.50 cm LEFT ATRIUM            Index        RIGHT ATRIUM           Index LA diam:      2.60 cm 1.57 cm/m   RA Area:     10.50 cm LA Vol (A2C): 46.5 ml 28.08 ml/m  RA Volume:   20.80 ml  12.56 ml/m   AORTA Ao Root diam: 3.50 cm Ao Asc diam:  3.30 cm  SHUNTS Systemic Diam: 2.10 cm Kristeen Miss MD Electronically signed by Kristeen Miss MD Signature Date/Time: 07/25/2021/4:31:29 PM    Final    US Abdomen Limited RUQ (LIVER/GB)  Result Date: 07/24/2021 CLINICAL DATA:  Abnormal liver function tests. EXAM: ULTRASOUND ABDOMEN LIMITED RIGHT UPPER QUADRANT COMPARISON:  None. FINDINGS: Gallbladder: No gallstones or wall thickening visualized. No sonographic Murphy sign noted by sonographer. Common bile duct: Diameter: 3 mm. Liver: No focal lesion identified. Within normal limits in parenchymal echogenicity. Portal vein is patent on color Doppler imaging with normal direction of blood flow towards the liver. Other: At least trace bilateral pleural effusions. IMPRESSION: 1. At least bilateral trace pleural effusions. 2. Otherwise unremarkable right upper quadrant ultrasound. Electronically Signed   By: Tish Frederickson M.D.   On: 07/24/2021 15:19        Scheduled Meds:  apixaban  5 mg Per Tube BID   bethanechol  10 mg Per Tube TID   chlorhexidine  15 mL Mouth Rinse BID   chlorhexidine gluconate (MEDLINE KIT)  15 mL Mouth Rinse BID   Chlorhexidine Gluconate Cloth  6 each Topical Daily   clonazepam  2 mg Per Tube BID   feeding supplement (PROSource TF)  45 mL Per Tube TID   folic acid  1 mg Per Tube Daily   mouth rinse  15 mL Mouth Rinse q12n4p   metoprolol tartrate  50 mg Per Tube BID   multivitamin with minerals  1 tablet Per Tube Daily   nicotine  21 mg Transdermal Daily   QUEtiapine  50 mg Per Tube BID   thiamine  100 mg Per Tube Daily   Continuous Infusions:  sodium chloride Stopped (07/23/21 1122)   feeding supplement (VITAL 1.5 CAL) 1,000 mL (07/26/21 0346)  lactated ringers 75 mL/hr at 07/26/21 1302     LOS: 27  days    Time spent: 55 minutes spent on chart review, discussion with nursing staff, consultants, updating family and interview/physical exam; more than 50% of that time was spent in counseling and/or coordination of care.    Arshi Duarte J [place] DO [facility] Available via Epic secure chat 7am-7pm After these hours, please refer to coverage provider listed on [email or web address] 07/26/2021, 1:50 PM

## 2021-07-26 NOTE — Assessment & Plan Note (Addendum)
-  Foley catheter discontinued on 08/09/2021; replaced on 09/09/2021 for recurrent urinary retention.   -Etiology likely secondary to poor mobility status. -C/w Tamsulosin 0.8 mg p.o. daily -C/w Finasteride 5 mg p.o. daily. -Foley catheter discontinued for voiding trial. -Patient with urine output of 0.9 L over the past 24 hours. -Continue postvoid residuals. -Continue to monitor urinary output; Strict I's and O's and Patient is -9.877 L during this hospitalization.

## 2021-07-27 DIAGNOSIS — G959 Disease of spinal cord, unspecified: Secondary | ICD-10-CM | POA: Diagnosis not present

## 2021-07-27 LAB — GLUCOSE, CAPILLARY
Glucose-Capillary: 95 mg/dL (ref 70–99)
Glucose-Capillary: 104 mg/dL — ABNORMAL HIGH (ref 70–99)
Glucose-Capillary: 102 mg/dL — ABNORMAL HIGH (ref 70–99)
Glucose-Capillary: 105 mg/dL — ABNORMAL HIGH (ref 70–99)
Glucose-Capillary: 115 mg/dL — ABNORMAL HIGH (ref 70–99)
Glucose-Capillary: 112 mg/dL — ABNORMAL HIGH (ref 70–99)

## 2021-07-27 LAB — CBC
HCT: 28.4 % — ABNORMAL LOW (ref 39.0–52.0)
Hemoglobin: 9.3 g/dL — ABNORMAL LOW (ref 13.0–17.0)
MCH: 31.6 pg (ref 26.0–34.0)
MCHC: 32.7 g/dL (ref 30.0–36.0)
MCV: 96.6 fL (ref 80.0–100.0)
Platelets: 743 10*3/uL — ABNORMAL HIGH (ref 150–400)
RBC: 2.94 MIL/uL — ABNORMAL LOW (ref 4.22–5.81)
RDW: 15.7 % — ABNORMAL HIGH (ref 11.5–15.5)
WBC: 13.8 10*3/uL — ABNORMAL HIGH (ref 4.0–10.5)
nRBC: 0 % (ref 0.0–0.2)

## 2021-07-27 LAB — COMPREHENSIVE METABOLIC PANEL
ALT: 108 U/L — ABNORMAL HIGH (ref 0–44)
AST: 100 U/L — ABNORMAL HIGH (ref 15–41)
Albumin: 2.2 g/dL — ABNORMAL LOW (ref 3.5–5.0)
Alkaline Phosphatase: 296 U/L — ABNORMAL HIGH (ref 38–126)
Anion gap: 9 (ref 5–15)
BUN: 15 mg/dL (ref 6–20)
CO2: 22 mmol/L (ref 22–32)
Calcium: 9.4 mg/dL (ref 8.9–10.3)
Chloride: 105 mmol/L (ref 98–111)
Creatinine, Ser: 0.51 mg/dL — ABNORMAL LOW (ref 0.61–1.24)
GFR, Estimated: >60 mL/min (ref >60)
Glucose, Bld: 112 mg/dL — ABNORMAL HIGH (ref 70–99)
Potassium: 3.6 mmol/L (ref 3.5–5.1)
Sodium: 136 mmol/L (ref 135–145)
Total Bilirubin: 0.4 mg/dL (ref 0.3–1.2)
Total Protein: 7.3 g/dL (ref 6.5–8.1)

## 2021-07-27 LAB — MAGNESIUM: Magnesium: 1.7 mg/dL (ref 1.7–2.4)

## 2021-07-27 MED ORDER — QUETIAPINE FUMARATE 25 MG PO TABS
25.0000 mg | ORAL_TABLET | Freq: Two times a day (BID) | ORAL | Status: DC
  Administered 2021-07-27 – 2021-08-01 (×7): 25 mg
  Filled 2021-07-27 (×8): qty 1

## 2021-07-27 NOTE — Progress Notes (Signed)
Physical Therapy Treatment ?Patient Details ?Name: Brian [name] ?MRN: [id number] ?DOB: 09-Jan-1961 ?Today's Date: 07/27/2021 ? ? ?History of Present Illness Pt is a 61 y/o male admitted 06/29/21 following C3-7 ACDF after progressive cervical myelopathy symptoms including ataxia, bil UE and LE shaking and weakness, and loss of function in his hands with subsequent fall. Pt initially had improvement in neurological symptoms, but postoperative course was complicated by dysphasia and ataxia secondary to and epidural hematoma with evacuation of hematoma on 2/7. Course complicated by delirium secondary to encephalopathy, sepsis secondary to aspiration PNA, reintubated 2/19-2/26 for airway protection, bilat segmental PEs 2/18. PMHx includes tobacco and EtOH use disorder. ? ?  ?PT Comments  ? ? Focus of session today functional bed mobility, sitting balance, and weight shifting while sitting. The patient tolerated well but was limited overall by fatigue.  Pt. Shows overall improvement with his active ROM, command following, and volitional mobility to maintain a sitting position. Sense of upright, overall strength, active muscular control, and functional balance are still limiting function. Pt. Would benefit from skilled PT to continue to address his bed mobility, sitting balance, and functional transfer ability. Plan and discharge setting remains unchanged. Pt to follow acutely as appropriate.  ?   ?Recommendations for follow up therapy are one component of a multi-disciplinary discharge planning process, led by the attending physician.  Recommendations may be updated based on patient status, additional functional criteria and insurance authorization. ? ?Follow Up Recommendations ? Skilled nursing-short term rehab (<3 hours/day) ?  ?  ?Assistance Recommended at Discharge Frequent or constant Supervision/Assistance  ?Patient can return home with the following Two people to help with walking and/or transfers;Two people to help  with bathing/dressing/bathroom;Direct supervision/assist for medications management;Direct supervision/assist for financial management;Assist for transportation;Assistance with feeding;Assistance with cooking/housework;Help with stairs or ramp for entrance ?  ?Equipment Recommendations ? Wheelchair (measurements PT);Hospital bed;Wheelchair cushion (measurements PT);BSC/3in1;Other (comment)  ?  ?Recommendations for Other Services Rehab consult ? ? ?  ?Precautions / Restrictions Precautions ?Precautions: Fall ?Cervical Brace: Soft collar;For comfort;Other (comment) (not donned during session, EOB only) ?Restrictions ?Weight Bearing Restrictions: No  ?  ? ?Mobility ? Bed Mobility ?Overal bed mobility: Needs Assistance ?Bed Mobility: Rolling, Sidelying to Sit, Sit to Sidelying ?Rolling: Max assist, +2 for physical assistance ?Sidelying to sit: Total assist, +2 for physical assistance ?  ?Sit to supine: Total assist, +2 for physical assistance ?Sit to sidelying: +2 for physical assistance, Max assist ?General bed mobility comments: Pt. able to follow commands and can see faciculations of him trying to make movements. ?  ? ?Transfers ?  ?  ?  ?  ?  ?  ?  ?  ?  ?  ?  ? ?Ambulation/Gait ?  ?  ?  ?  ?  ?  ?  ?  ? ? ?Stairs ?  ?  ?  ?  ?  ? ? ?Wheelchair Mobility ?  ? ?Modified Rankin (Stroke Patients Only) ?  ? ? ?  ?Balance Overall balance assessment: Needs assistance ?Sitting-balance support: Bilateral upper extremity supported, Feet unsupported ?Sitting balance-Leahy Scale: Poor ?Sitting balance - Comments: MaxA for sitting balance, Pt. able to participate in weight shifting and leaning but requires support to return to upright ?Postural control: Posterior lean ?  ?  ?  ?  ?  ?  ?  ?  ?  ?  ?  ?  ?  ?  ?  ? ?  ?Cognition Arousal/Alertness: Awake/alert ?  ?Overall Cognitive Status:  Impaired/Different from baseline ?Area of Impairment: Orientation, Attention, Memory, Following commands, Safety/judgement, Awareness, Problem  solving ?  ?  ?  ?  ?  ?  ?  ?  ?Orientation Level: Disoriented to, Place, Time ?Current Attention Level: Focused ?Memory: Decreased short-term memory, Decreased recall of precautions ?Following Commands: Follows one step commands with increased time ?Safety/Judgement: Decreased awareness of safety, Decreased awareness of deficits ?Awareness: Intellectual ?Problem Solving: Difficulty sequencing, Requires verbal cues, Requires tactile cues, Slow processing ?General Comments: Pt. responds to some questions but talks very quietly ?  ?  ? ?  ?Exercises   ? ?  ?General Comments   ?  ?  ? ?Pertinent Vitals/Pain Pain Assessment ?Pain Assessment: Faces ?Faces Pain Scale: Hurts a little bit ?Pain Descriptors / Indicators: Grimacing, Guarding, Sore ?Pain Intervention(s): Limited activity within patient's tolerance, Monitored during session  ? ? ?Home Living   ?  ?  ?  ?  ?  ?  ?  ?  ?  ?   ?  ?Prior Function    ?  ?  ?   ? ?PT Goals (current goals can now be found in the care plan section) Acute Rehab PT Goals ?Patient Stated Goal: to get better and be independent, back to PLOF ?PT Goal Formulation: With patient ?Time For Goal Achievement: 08/06/21 ?Potential to Achieve Goals: Fair ?Progress towards PT goals: Progressing toward goals ? ?  ?Frequency ? ? ? Min 3X/week ? ? ? ?  ?PT Plan Current plan remains appropriate  ? ? ?Co-evaluation   ?  ?  ?  ?  ? ?  ?AM-PAC PT "6 Clicks" Mobility   ?Outcome Measure ? Help needed turning from your back to your side while in a flat bed without using bedrails?: Total ?Help needed moving from lying on your back to sitting on the side of a flat bed without using bedrails?: Total ?Help needed moving to and from a bed to a chair (including a wheelchair)?: Total ?Help needed standing up from a chair using your arms (e.g., wheelchair or bedside chair)?: Total ?Help needed to walk in hospital room?: Total ?Help needed climbing 3-5 steps with a railing? : Total ?6 Click Score: 6 ? ?  ?End of  Session   ?Activity Tolerance: Patient tolerated treatment well;Patient limited by fatigue ?Patient left: in bed;with call bell/phone within reach;with bed alarm set ?Nurse Communication: Mobility status ?PT Visit Diagnosis: Unsteadiness on feet (R26.81);Muscle weakness (generalized) (M62.81);Other symptoms and signs involving the nervous system (R29.898);Difficulty in walking, not elsewhere classified (R26.2) ?  ? ? ?Time: [id number] ?PT Time Calculation (min) (ACUTE ONLY): 27 min ? ?Charges:  $Therapeutic Activity: 8-22 mins ?$Neuromuscular Re-education: 8-22 mins          ?          ? ?Thermon Leyland, SPT ?Acute Rehab Services ? ? ? ?Thermon Leyland ?07/27/2021, 3:18 PM ? ?

## 2021-07-27 NOTE — Consult Note (Signed)
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  He was admitted on 2/3 and underwent ACDF of C3-4, C4-5, C5-5, and C6-7.  Post-operatively he was progressing slowly with residual weakness in both hands but improving lower extremity strength and function.  He remains moderately confused per chart.  He developed some difficulty swallowing and found to be moderate aspiration risk. He is on continuous feeds by NG tube and IR has been consulted for placement of G-tube.  Case was reviewed and approved by Dr. Laurence Ferrari.  History reviewed. No pertinent past medical history.  Past Surgical History:  Procedure Laterality Date   ANTERIOR CERVICAL DECOMPRESSION/DISCECTOMY FUSION 4 LEVELS N/A 06/29/2021   Procedure: Anterior Cervical Discectomy Fusion - Cervical Three-Cervical Four - Cervical Four- Cervical Five - Cervical Five- Cervical Six - Cervical Six- Cervical Seven;  Surgeon: Earnie Larsson, MD;  Location: [place];  Service: Neurosurgery;  Laterality: N/A;   HEMATOMA EVACUATION N/A 07/03/2021   Procedure: EXPLORATION OF NECK AND REMOVE BLOOD CLOT;  Surgeon: Earnie Larsson, MD;  Location: [place];  Service: Neurosurgery;  Laterality: N/A;    Allergies: Patient has no known allergies.  Medications: Prior to Admission medications   Medication Sig Start Date End Date Taking? Authorizing Provider  naproxen sodium (ALEVE) 220 MG tablet Take 440 mg by mouth 2 (two) times daily as needed  (pain.).   Yes [provider]  methylPREDNISolone (MEDROL DOSEPAK) 4 MG TBPK tablet Take as prescribed Patient not taking: Reported on 06/27/2021 06/19/21   Teressa Lower, MD     History reviewed. No pertinent family history.  Social History   Socioeconomic History   Marital status: Married    Spouse name: Not on file   Number of children: Not on file   Years of education: Not on file   Highest education level: Not on file  Occupational History   Not on file  Tobacco Use   Smoking status: Every Day    Packs/day: 0.33    Types: Cigarettes   Smokeless tobacco: Never  Vaping Use   Vaping Use: Never used  Substance and Sexual Activity   Alcohol use: Not Currently    Comment: beers on weekends only   Drug use: Yes    Types: Marijuana    Comment: only on weekends   Sexual activity: Not on file  Other Topics Concern   Not on file  Social History Narrative   Not on file   Social Determinants of Health   Financial Resource Strain: Not on file  Food Insecurity: Not on file  Transportation Needs: Not on file  Physical Activity: Not on file  Stress: Not on file  Social Connections: Not on file    Review of Systems  Constitutional:  Negative for chills and fever.  HENT: Negative.    Eyes: Negative.   Respiratory:  Negative for shortness of breath.   Cardiovascular: Negative.   Gastrointestinal: Negative.   Genitourinary: Negative.   Musculoskeletal: Negative.   Skin: Negative.   Neurological:  Positive for weakness.  Negative for tingling and headaches.  Endo/Heme/Allergies: Negative.   Psychiatric/Behavioral: Negative.      Vital Signs: BP (!) 136/95 (BP Location: Right Arm)    Pulse (!) 101    Temp 97.7 F (36.5 C) (Axillary)    Resp 20    Ht 5\' 10"  (1.778 m)    Wt 111 lb 8.8 oz (50.6 kg)    SpO2 100%    BMI 16.01 kg/m   Physical Exam Constitutional:      General: He is not in acute distress.    Appearance: He is cachectic. He is ill-appearing.      Comments: Mittens on hands, NG tube in place  HENT:     Head: Normocephalic.     Mouth/Throat:     Mouth: Mucous membranes are dry.     Pharynx: Oropharynx is clear.  Eyes:     Extraocular Movements: Extraocular movements intact.  Cardiovascular:     Rate and Rhythm: Tachycardia present.  Pulmonary:     Effort: Pulmonary effort is normal.     Breath sounds: Normal breath sounds.  Abdominal:     General: Abdomen is flat.     Palpations: Abdomen is soft.  Skin:    General: Skin is dry.  Neurological:     Mental Status: He is easily aroused. He is confused.     Comments: Confusion--he states "let me go just through there to the store" from recumbent position in hospital bed, does not recognize son's name initially       Imaging: CT ANGIO HEAD NECK W WO CM  Result Date: 07/03/2021 CLINICAL DATA:  Neuro deficit, acute, stroke suspected EXAM: CT ANGIOGRAPHY HEAD AND NECK TECHNIQUE: Multidetector CT imaging of the head and neck was performed using the standard protocol during bolus administration of intravenous contrast. Multiplanar CT image reconstructions and MIPs were obtained to evaluate the vascular anatomy. Carotid stenosis measurements (when applicable) are obtained utilizing NASCET criteria, using the distal internal carotid diameter as the denominator. RADIATION DOSE REDUCTION: This exam was performed according to the departmental dose-optimization program which includes automated exposure control, adjustment of the mA and/or kV according to patient size and/or use of iterative reconstruction technique. CONTRAST:  24mL OMNIPAQUE IOHEXOL 350 MG/ML SOLN COMPARISON:  None. FINDINGS: CTA NECK FINDINGS Aortic arch: Great vessel origins are patent. Right carotid system: Patent.  No stenosis. Left carotid system: Patent.  No stenosis. Vertebral arteries: Patent and codominant.  No stenosis. Skeleton: Postoperative changes of anterior fusion from C3-C7 with plate and screw fixation and interbody  allografts. Foci of gas are present in the ventral epidural space at C4-C5. Other neck: There is significant prevertebral soft tissue swelling. Postoperative changes are present in the neck. Upper chest: Emphysema. Review of the MIP images confirms the above findings CTA HEAD FINDINGS Anterior circulation: Intracranial internal carotid arteries are patent with minor calcified plaque. Anterior and middle cerebral arteries are patent. Posterior circulation: Intracranial vertebral arteries are patent. Basilar artery is patent. Major cerebellar artery origins are patent. A left posterior communicating artery is present. Posterior cerebral arteries are patent. Venous sinuses: As permitted by contrast timing, patent. Review of the MIP images confirms the above findings IMPRESSION: No large vessel occlusion, hemodynamically significant stenosis, or evidence of dissection. Postoperative changes of recent C3-C7 ACDF. There is significant prevertebral soft tissue swelling. Foci of gas are present the ventral epidural space at C4-C5; a small collection is not excluded. Electronically Signed   By: Macy Mis M.D.   On: 07/03/2021  08:52   DG Cervical Spine 1 View  Result Date: 06/29/2021 CLINICAL DATA:  Surgery: C3-7 ACDF. EXAM: DG CERVICAL SPINE - 1 VIEW COMPARISON:  Cervical spine MRI 06/19/2021. FINDINGS: C-arm fluoroscopy provided in the operating room. 5 seconds of fluoroscopy time. 7.77 mGy. A single spot fluoroscopic image of the cervical spine is submitted from the operating room. This demonstrates evidence of interval anterior cervical discectomy from C3 through at least C7. The inferior extent of the surgical hardware is not visualized on this single spot image. No complications are identified. IMPRESSION: Single intraoperative spot lateral view of the cervical spine following cervical fusion. Electronically Signed   By: Richardean Sale M.D.   On: 06/29/2021 11:43   CT HEAD WO CONTRAST (5MM)  Result Date:  07/07/2021 CLINICAL DATA:  Altered mental status EXAM: CT HEAD WITHOUT CONTRAST TECHNIQUE: Contiguous axial images were obtained from the base of the skull through the vertex without intravenous contrast. RADIATION DOSE REDUCTION: This exam was performed according to the departmental dose-optimization program which includes automated exposure control, adjustment of the mA and/or kV according to patient size and/or use of iterative reconstruction technique. COMPARISON:  07/03/2021 FINDINGS: Brain: No evidence of acute infarction, hemorrhage, hydrocephalus, extra-axial collection or mass lesion/mass effect. Vascular: No hyperdense vessel or unexpected calcification. Skull: Normal. Negative for fracture or focal lesion. 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 Paranasal sinuses and mastoid air cells otherwise clear. Other: None. IMPRESSION: No acute intracranial abnormality. Electronically Signed   By: Davina Poke D.O.   On: 07/07/2021 13:56   MR BRAIN WO CONTRAST  Result Date: 07/07/2021 CLINICAL DATA:  Altered mental status. EXAM: MRI HEAD WITHOUT CONTRAST TECHNIQUE: Multiplanar, multiecho pulse sequences of the brain and surrounding structures were obtained without intravenous contrast. COMPARISON:  Head CT 07/07/2021 and MRI 06/19/2021 FINDINGS: Brain: There is no evidence of an acute infarct, intracranial hemorrhage, mass, midline shift, or extra-axial fluid collection. A punctate focus of trace diffusion weighted signal hyperintensity without reduced ADC in the left occipital lobe is unchanged. The ventricles and sulci are within normal limits for age. Scattered punctate T2 hyperintensities in the cerebral white matter bilaterally are unchanged and nonspecific but compatible with minimal chronic small vessel ischemic disease. Vascular: Major intracranial vascular flow voids are preserved. Skull and upper cervical spine: Unremarkable bone marrow signal para Sinuses/Orbits:  Unremarkable orbits. Paranasal sinuses and mastoid air cells are clear. Other: None. IMPRESSION: 1. No acute intracranial abnormality. 2. Minimal chronic small vessel ischemic disease. Electronically Signed   By: Logan Bores M.D.   On: 07/07/2021 16:30   MR CERVICAL SPINE WO CONTRAST  Result Date: 07/03/2021 CLINICAL DATA:  Myelopathy, acute, cervical spine EXAM: MRI CERVICAL SPINE WITHOUT CONTRAST TECHNIQUE: Multiplanar, multisequence MR imaging of the cervical spine was performed. No intravenous contrast was administered. COMPARISON:  None. FINDINGS: Alignment: Straightening of the normal cervical lordosis. No substantial sagittal subluxation. Vertebrae: C3-C7 ACDF. Fluid/hemorrhage interdigitating between the postoperative disc spaces, as detailed below. Limited evaluation of the vertebral bodies due to metallic hardware artifact. Cord: Possible mild cord edema at the levels of compressive stenosis, poorly characterized due to mass effect. Posterior Fossa, vertebral arteries, paraspinal tissues: Extensive prevertebral edema is largely obscured by fat saturation band. Vertebral artery flow voids are maintained. Disc levels: T2 hyperintense ventral epidural hemorrhage/fluid extending from C2 inferiorly to C7 and extending into the postoperative disc spaces at these levels. C2-C3: Posterior disc osteophyte complex. Left greater than right facet uncovertebral hypertrophy. Resulting severe left and moderate right  foraminal stenosis. Thin ventral epidural hemorrhage/fluid collection. Resulting severe canal stenosis. C3-C4: Ventral epidural hemorrhage/fluid which is contiguous with the postoperative disc space. Hemorrhage measures approximately 4 mm in thickness posterior to the vertebral body. Ligamentum flavum thickening. Resulting severe canal stenosis with compression of the cord. Bilateral facet and uncovertebral hypertrophy. Severe bilateral foraminal stenosis. C4-C5: Ventral epidural hemorrhage/fluid which is  contiguous with the postoperative disc space. Hemorrhage measures approximately 3-4 mm in thickness posterior to the vertebral body. Ligamentum flavum thickening. Resulting severe canal stenosis with compression of the cord. Bilateral facet and uncovertebral hypertrophy. Severe bilateral foraminal stenosis. C5-C6: Ventral epidural hemorrhage/fluid which is contiguous with the postoperative disc space. Hemorrhage measures approximately 3 mm in thickness posterior to the vertebral body. Ligamentum flavum thickening. Resulting severe canal stenosis with compression of the cord. Bilateral facet and uncovertebral hypertrophy. Severe bilateral foraminal stenosis. C6-C7: Ventral epidural hemorrhage/fluid which is contiguous with the postoperative disc space. Ligamentum flavum thickening. Resulting severe canal stenosis. Bilateral facet and uncovertebral hypertrophy. Severe bilateral foraminal stenosis. C7-T1: Posterior disc osteophyte complex. Bilateral facet uncovertebral hypertrophy. Resulting severe right and moderate left foraminal stenosis. Moderate canal stenosis. IMPRESSION: 1. Postoperative changes of recent C3-C7 ACDF. Ventral epidural hemorrhage/fluid extending from C2 inferiorly to C7 and extending into the postoperative disc spaces at these levels. Resulting severe canal stenosis at these levels with compression of the cord and possible cord edema, detailed above. 2. Superimposed multilevel degenerative change with severe multilevel foraminal stenosis. 3. Prevertebral soft tissue swelling better characterized on recent CTA. These results will be called to the ordering clinician or representative by the Radiologist Assistant, and communication documented in the PACS or [employer]. Electronically Signed   By: Margaretha Sheffield M.D.   On: 07/03/2021 13:02   CT CHEST ABDOMEN PELVIS W CONTRAST  Result Date: 07/14/2021 CLINICAL DATA:  Sepsis, evaluate source EXAM: CT CHEST, ABDOMEN, AND PELVIS WITH CONTRAST  TECHNIQUE: Multidetector CT imaging of the chest, abdomen and pelvis was performed following the standard protocol during bolus administration of intravenous contrast. RADIATION DOSE REDUCTION: This exam was performed according to the departmental dose-optimization program which includes automated exposure control, adjustment of the mA and/or kV according to patient size and/or use of iterative reconstruction technique. CONTRAST:  165mL OMNIPAQUE IOHEXOL 300 MG/ML  SOLN COMPARISON:  None available FINDINGS: CT CHEST FINDINGS Cardiovascular: No significant vascular findings. Normal heart size. No pericardial effusion. There is good contrast opacification of pulmonary artery branches. Segmental pulmonary emboli in right upper lobe in bilateral lower lobes. Adequate contrast opacification of the thoracic aorta with no evidence of dissection, aneurysm, or stenosis. There is classic 3-vessel brachiocephalic arch anatomy without proximal stenosis. Mediastinum/Nodes: No mass or adenopathy. Lungs/Pleura: Trace right pleural effusion. No pneumothorax. Pulmonary emphysema. Fairly extensive airspace disease posteriorly in both lower lobes with air bronchograms. Musculoskeletal: Cervical fixation hardware partially visualized. No acute findings. CT ABDOMEN PELVIS FINDINGS Hepatobiliary: No focal liver abnormality is seen. No gallstones, gallbladder wall thickening, or biliary dilatation. Pancreas: Unremarkable. No pancreatic ductal dilatation or surrounding inflammatory changes. Spleen: Normal in size without focal abnormality. Adrenals/Urinary Tract: No adrenal mass. Kidneys unremarkable. Foley catheter partially decompresses urinary bladder. Stomach/Bowel: Stomach is partially distended, unremarkable. Small bowel is nondilated. Hyperdense material in the nondilated appendix. Moderate gas and fecal distention of the colon, relatively decompressed distally. No evident bowel wall thickening or regional inflammatory change.  Vascular/Lymphatic: Moderate scattered aortoiliac atheromatous plaque without aneurysm or evident stenosis. No abdominal or pelvic adenopathy. Reproductive: Prostate enlargement with central coarse calcifications. Other: No  ascites.  No free air. Musculoskeletal: Lumbar spondylitic changes L3-S1. IMPRESSION: 1. POSITIVE for bilateral segmental pulmonary emboli. 2. Bilateral lower lobe airspace disease with air bronchograms, suggesting pneumonia. Critical Value/emergent results were called by telephone at the time of interpretation on 07/14/2021 at 11:14 am to provider [facility] , who verbally acknowledged these results. 3. No acute abdominal findings. 4.  Aortic Atherosclerosis (ICD10-170.0). Electronically Signed   By: Lucrezia Europe M.D.   On: 07/14/2021 11:15   DG CHEST PORT 1 VIEW  Result Date: 07/19/2021 CLINICAL DATA:  Difficulty breathing EXAM: PORTABLE CHEST 1 VIEW COMPARISON:  Previous studies including the examination of 07/18/2021 FINDINGS: Transverse diameter of heart is increased. There is interval decrease in interstitial markings in the parahilar regions and lower lung fields suggesting resolving pulmonary edema or resolving pneumonia. There is homogeneous opacity in the medial left lower lung fields. There is blunting of both lateral CP angles. There is no pneumothorax. Tip of endotracheal tube is 5.6 cm above the carina. Enteric tube is noted traversing the esophagus. IMPRESSION: There is interval decrease in interstitial markings in both lungs suggesting resolving pulmonary edema or resolving interstitial pneumonia. There is infiltrate with air bronchogram in the medial left lower lung fields suggesting atelectasis/pneumonia. Small bilateral pleural effusions, more so on the left side. Electronically Signed   By: Elmer Picker M.D.   On: 07/19/2021 13:07   DG Chest Port 1 View  Result Date: 07/18/2021 CLINICAL DATA:  61 year old male with acute respiratory failure, evaluate endotracheal  tube placement. EXAM: PORTABLE CHEST - 1 VIEW COMPARISON:  07/17/2021 FINDINGS: The mediastinal contours are within normal limits. No cardiomegaly. Minimal interval retraction of the endotracheal tube with its tip in the midthoracic trachea. Enteric tube terminates off the inferior aspect of this image, unchanged. Interval increased hazy and reticular opacities throughout the right lung, most prominent about the base. Interval development of mild hazy opacities in the left lower lobe and lingula. Trace bilateral pleural effusions. No pneumothorax. No acute osseous abnormality. Similar appearance of partially visualized cervical fusion hardware. IMPRESSION: 1. Interval worsening of bilateral, right greater than left, hazy and reticular opacities with associated development of trace bilateral pleural effusions indicative of worsening pulmonary edema versus multifocal pneumonia. 2. Slight interval retraction of endotracheal tube which remains within the midthoracic trachea. Electronically Signed   By: Ruthann Cancer M.D.   On: 07/18/2021 08:41   DG Chest Port 1 View  Result Date: 07/17/2021 CLINICAL DATA:  Acute respiratory failure with hypoxia EXAM: PORTABLE CHEST 1 VIEW COMPARISON:  Chest x-ray 07/17/2021 5:33 a.m. FINDINGS: Endotracheal tube with tip terminating 4 cm above the carina. Enteric tube coursing below the hemidiaphragm with tip collimated off view. The heart and mediastinal contours are unchanged. There is aortic calcification. Similar-appearing bilateral airspace opacities more confluent within the right lung. No pulmonary edema. Nonspecific blunting of the right costophrenic angle likely representing trace pleural effusion. Persistent trace left pleural effusion. No pneumothorax. No acute osseous abnormality. IMPRESSION: 1. Lines and tubes in stable position with the tip of the enteric tube collimated off view. 2. Similar-appearing bilateral airspace opacities. 3. Trace bilateral pleural effusions.  Electronically Signed   By: Iven Finn M.D.   On: 07/17/2021 16:08   DG CHEST PORT 1 VIEW  Result Date: 07/17/2021 CLINICAL DATA:  Aspiration EXAM: PORTABLE CHEST 1 VIEW COMPARISON:  07/16/2021 FINDINGS: Endotracheal tube overlies the midthoracic trachea. Feeding tube passes below the diaphragm, tip excluded by collimation. Unchanged cardiomediastinal silhouette. Persistent bilateral airspace disease, most  confluent in the right upper lobe and bilateral lung bases. There is slight improvement in the left upper lung. Stable small pleural effusions. No visible pneumothorax. No acute osseous abnormality. Unchanged partially visualized contrast material in the left upper quadrant. IMPRESSION: Persistent bilateral airspace disease, slightly improved in the left upper lung. Stable small effusions. Electronically Signed   By: Maurine Simmering M.D.   On: 07/17/2021 08:18   DG Chest Port 1 View  Result Date: 07/16/2021 CLINICAL DATA:  Respiratory failure. EXAM: PORTABLE CHEST 1 VIEW COMPARISON:  07/15/2021 and CT chest 07/14/2021. FINDINGS: Endotracheal tube terminates approximately 4.9 cm above the carina. Nasogastric tube is followed into the stomach with the tip projecting beyond the inferior margin of the image. Heart size normal. Hazy opacification bilaterally, worst in the right upper lobe. Small bilateral pleural effusions. Findings are similar to yesterday's exam. IMPRESSION: Multilobar pneumonia with small bilateral pleural effusions, similar to yesterday. Electronically Signed   By: Lorin Picket M.D.   On: 07/16/2021 08:12   DG Chest Port 1 View  Result Date: 07/15/2021 CLINICAL DATA:  Intubation EXAM: PORTABLE CHEST 1 VIEW COMPARISON:  Earlier same day FINDINGS: Endotracheal tube tip 3.5 cm above the carina. Orogastric or nasogastric tube enters the stomach. Pulmonary aeration is improved, but there are widespread pulmonary infiltrates right more than left. IMPRESSION: Endotracheal tube tip well  position 3.5 cm above the carina. Orogastric or nasogastric tube enters the stomach. Improved pulmonary aeration following intubation. Widespread pulmonary infiltrates do persist, right more than left. Electronically Signed   By: Nelson Chimes M.D.   On: 07/15/2021 16:36   DG Chest Port 1 View  Result Date: 07/15/2021 CLINICAL DATA:  Hypoxemia EXAM: PORTABLE CHEST 1 VIEW COMPARISON:  07/13/2021 FINDINGS: Cardiomegaly. New, diffuse bilateral heterogeneous and interstitial airspace opacity. Underlying emphysema. IMPRESSION: 1. New, diffuse bilateral heterogeneous and interstitial airspace opacity, consistent with edema or infection. 2.  Emphysema. 3.  Cardiomegaly. Electronically Signed   By: Delanna Ahmadi M.D.   On: 07/15/2021 12:06   DG Chest Port 1 View  Result Date: 07/13/2021 CLINICAL DATA:  Shortness of breath EXAM: PORTABLE CHEST 1 VIEW COMPARISON:  Chest x-ray 07/10/2021 FINDINGS: Heart size and mediastinum are stable and within normal limits. Pulmonary vasculature is normal. No focal consolidation identified. Mildly low lung volumes with likely subsegmental atelectatic changes at the lung bases. No significant pleural effusion visualized. No pneumothorax. IMPRESSION: Low lung volumes with no acute process identified. Electronically Signed   By: Ofilia Neas M.D.   On: 07/13/2021 11:13   DG CHEST PORT 1 VIEW  Result Date: 07/10/2021 CLINICAL DATA:  Sinus tachycardia EXAM: PORTABLE CHEST 1 VIEW COMPARISON:  Chest x-ray 07/03/2021 FINDINGS: Heart size and mediastinal contours are within normal limits. No suspicious pulmonary opacities identified. No pleural effusion or pneumothorax visualized. No acute osseous abnormality appreciated. IMPRESSION: No acute intrathoracic process identified. Electronically Signed   By: Ofilia Neas M.D.   On: 07/10/2021 08:31   DG CHEST PORT 1 VIEW  Result Date: 07/03/2021 CLINICAL DATA:  Aspiration pneumonia. EXAM: PORTABLE CHEST 1 VIEW COMPARISON:   One-view abdomen 07/02/2021 FINDINGS: The heart size is normal. Left lower lobe airspace consolidation is present with air bronchograms posterior to the cardiac silhouette. Medial right basilar airspace disease stable. Upper lung fields are clear. Overall volumes are improved. IMPRESSION: 1. Left lower lobe airspace disease compatible with pneumonia. 2. Stable medial right basilar airspace disease. Electronically Signed   By: San Morelle M.D.   On: 07/03/2021 11:04  DG CHEST PORT 1 VIEW  Result Date: 07/02/2021 CLINICAL DATA:  Fever EXAM: PORTABLE CHEST 1 VIEW COMPARISON:  None. FINDINGS: Cardiac and mediastinal contours within normal limits. Mild right basilar and left perihilar opacities. No large pleural effusion or pneumothorax. IMPRESSION: Mild right basilar and left perihilar opacities, possibly due to atelectasis, although infection or aspiration could appear similar. Electronically Signed   By: Yetta Glassman M.D.   On: 07/02/2021 09:52   DG Abd Portable 1V  Result Date: 07/18/2021 CLINICAL DATA:  Abdominal distension EXAM: PORTABLE ABDOMEN - 1 VIEW COMPARISON:  07/16/2021, 07/15/2021 FINDINGS: Enteric tube remains positioned within the stomach. Progressive distension of the cecum with large amount of fecal material. Gaseous distension of the transverse colon. No abnormally dilated loops of small bowel. No gross free intraperitoneal air on AP portable supine view. IMPRESSION: 1. Progressive distension of the cecum with large amount of fecal material. Gaseous distension of the transverse colon. No abnormally dilated small bowel. 2. Enteric tube remains within the stomach. Electronically Signed   By: Davina Poke D.O.   On: 07/18/2021 08:25   DG Abd Portable 1V  Result Date: 07/16/2021 CLINICAL DATA:  Nasogastric tube check. EXAM: PORTABLE ABDOMEN - 1 VIEW COMPARISON:  07/15/2021 FINDINGS: Endotracheal tube is in place, tip approximately 4.5 centimeters above the carina. Feeding tube  has been placed, tip overlying the level of the horizontal portion of the duodenum. Less likely, the tube could be coiled in the stomach, redirected towards the mid stomach. There is residual contrast in colon. Patchy opacity identified in the RIGHT UPPER lobe and both LOWER lobes, consistent with infectious infiltrate. IMPRESSION: Feeding tube tip likely within the horizontal portion of the duodenum. Bilateral pulmonary infiltrates. Electronically Signed   By: Nolon Nations M.D.   On: 07/16/2021 12:00   DG Abd Portable 1V  Result Date: 07/15/2021 CLINICAL DATA:  Hypoxemia. EXAM: PORTABLE ABDOMEN - 1 VIEW COMPARISON:  07/14/2021 FINDINGS: Unchanged appearance of moderate gas and fecal distension of the colon compared with the previous exam. No significant small bowel distension noted. Contrast material noted within the splenic flexure and rectum. Unchanged from previous study. IMPRESSION: No change in moderate gas and fecal distension of the colon . Electronically Signed   By: Kerby Moors M.D.   On: 07/15/2021 12:09   DG Abd Portable 1V  Result Date: 07/04/2021 CLINICAL DATA:  Feeding tube placement EXAM: PORTABLE ABDOMEN - 1 VIEW COMPARISON:  None. FINDINGS: Feeding tube terminates in the antropyloric region. Bilateral airspace disease within both mid and lower lungs. Contrast within the colon. No free intraperitoneal air. IMPRESSION: Feeding tube terminating at the antrum/pyloric region. Electronically Signed   By: Abigail Miyamoto M.D.   On: 07/04/2021 10:37   DG Swallowing Func-Speech Pathology  Result Date: 07/26/2021 Table formatting from the original result was not included. Objective Swallowing Evaluation: Type of Study: MBS-Modified Barium Swallow Study  Patient Details Name: Brian [name] MRN: [id number] Date of Birth: 11/29/1960 Today's Date: 07/26/2021 Time: SLP Start Time (ACUTE ONLY): [id number] -SLP Stop Time (ACUTE ONLY): 0905 SLP Time Calculation (min) (ACUTE ONLY): 30 min Past Medical  History: No past medical history on file. Past Surgical History: Past Surgical History: Procedure Laterality Date  ANTERIOR CERVICAL DECOMPRESSION/DISCECTOMY FUSION 4 LEVELS N/A 06/29/2021  Procedure: Anterior Cervical Discectomy Fusion - Cervical Three-Cervical Four - Cervical Four- Cervical Five - Cervical Five- Cervical Six - Cervical Six- Cervical Seven;  Surgeon: Earnie Larsson, MD;  Location: [facility];  Service: Neurosurgery;  Laterality: N/A;  HEMATOMA EVACUATION N/A 07/03/2021  Procedure: EXPLORATION OF NECK AND REMOVE BLOOD CLOT;  Surgeon: Earnie Larsson, MD;  Location: [facility];  Service: Neurosurgery;  Laterality: N/A; HPI: Pt is a 61 y/o admitted 06/29/21 following C3-7 ACDF after progressive cervical myelopathy symptoms with subsequent fall and severe incomplete SCI. Significant prevertebral swelling per imaging. Pt developed some difficulty swallowing on 2/5 and SLP consulted. MBS 2/6 revealed severe edema s/p ACDF, oral holding, a pharyngeal delay, inconsistent hyolaryngeal elevation, incomplete epiglottic inversion due to edema. Aspiration noted accross consistencies and and NPO status was recommended. Repeat MBS 2/10 showed significantly reduced pharyngeal edema and a dysphagia 2 diet with nectar thick liquids was initiated. Pt transitioned to thin liquids 2/16 and susbequently returned to nectar thick 2/17 due to concern for aspiration. Pt developed pna, possible aspiration, requiring intubation 2/19-26. Cortrak placed 2/20. No significant PMH.  No data recorded  Recommendations for follow up therapy are one component of a multi-disciplinary discharge planning process, led by the attending physician.  Recommendations may be updated based on patient status, additional functional criteria and insurance authorization. Assessment / Plan / Recommendation Clinical Impressions 07/26/2021 Clinical Impression Pt presents with oropharyngeal dysphagia which is notably worse compared to the MBS on 2/10. The impact of mentation  and prolonged intubation on this is considered. Prevertebral edema is further improved compared to the MBS on 2/10 and a cortrak was present during both studies so this is likely not a significant factor in worsened function. Oral holding, weak bolus manipulation, reduced lingual retraction, and reduced anterior laryngeal movement were noted. He demonstrated lingual residue, vallecular residue, pyriform sinus residue, and reduced epiglottic inversion which resulted in reduced airway protection.  In the absence of a liquid wash, pt was unable to transport puree and dysphagia 3 boluses past the valleculae. He exhibited penetration (PAS 3, 5) and subsequent silent aspiration (PAS 8) with all liquids via tsp, cup and straw. The aspirate often originated from penetrate on the laryngeal surface of the epiglottis or from pyriform sinus residue after the swallow. Despite frequency of aspiration, no throat clearing/coughing was noted, and prompted coughing was ineffective. Airway protection and laryngeal invasion were improved with a left head turn posture. This was able to eliminate penetration and aspiration with honey thick and nectar thick liquids via cup. However, pt exhibited significant difficulty demonstrating and maintaining this despite consistent use of verbal and/or tactile cues. It is recommended that the pt's NPO status be maintained at this time, but that ice chips continue to be allowed after thorough oral care. SLP will follow pt for dysphagia treatment. SLP Visit Diagnosis Dysphagia, oropharyngeal phase (R13.12) Attention and concentration deficit following -- Frontal lobe and executive function deficit following -- Impact on safety and function Severe aspiration risk   Treatment Recommendations 07/26/2021 Treatment Recommendations Therapy as outlined in treatment plan below   Prognosis 07/26/2021 Prognosis for Safe Diet Advancement Good Barriers to Reach Goals Cognitive deficits Barriers/Prognosis Comment --  Diet Recommendations 07/26/2021 SLP Diet Recommendations NPO Liquid Administration via -- Medication Administration Via alternative means Compensations -- Postural Changes --   Other Recommendations 07/26/2021 Recommended Consults -- Oral Care Recommendations Oral care QID Other Recommendations -- Follow Up Recommendations Acute inpatient rehab (3hours/day) Assistance recommended at discharge -- Functional Status Assessment Patient has had a recent decline in their functional status and demonstrates the ability to make significant improvements in function in a reasonable and predictable amount of time. Frequency and Duration  07/26/2021 Speech Therapy Frequency (ACUTE ONLY) min 2x/week Treatment  Duration 2 weeks   Oral Phase 07/26/2021 Oral Phase Impaired Oral - Pudding Teaspoon -- Oral - Pudding Cup -- Oral - Honey Teaspoon -- Oral - Honey Cup -- Oral - Nectar Teaspoon Holding of bolus;Delayed oral transit Oral - Nectar Cup Holding of bolus;Delayed oral transit Oral - Nectar Straw -- Oral - Thin Teaspoon NT Oral - Thin Cup Decreased velopharyngeal closure Oral - Thin Straw Holding of bolus;Delayed oral transit Oral - Puree Holding of bolus;Delayed oral transit Oral - Mech Soft Holding of bolus;Delayed oral transit;Impaired mastication Oral - Regular NT Oral - Multi-Consistency -- Oral - Pill -- Oral Phase - Comment --  Pharyngeal Phase 07/26/2021 Pharyngeal Phase Impaired Pharyngeal- Pudding Teaspoon -- Pharyngeal -- Pharyngeal- Pudding Cup -- Pharyngeal -- Pharyngeal- Honey Teaspoon -- Pharyngeal -- Pharyngeal- Honey Cup -- Pharyngeal -- Pharyngeal- Nectar Teaspoon Pharyngeal residue - valleculae;Pharyngeal residue - pyriform;Reduced epiglottic inversion;Reduced anterior laryngeal mobility;Reduced airway/laryngeal closure;Reduced tongue base retraction Pharyngeal Material enters airway, remains ABOVE vocal cords and not ejected out;Material enters airway, CONTACTS cords and not ejected out;Material enters airway, passes  BELOW cords without attempt by patient to eject out (silent aspiration) Pharyngeal- Nectar Cup Pharyngeal residue - valleculae;Pharyngeal residue - pyriform;Reduced epiglottic inversion;Reduced anterior laryngeal mobility;Reduced airway/laryngeal closure;Reduced tongue base retraction Pharyngeal Material enters airway, remains ABOVE vocal cords and not ejected out;Material enters airway, CONTACTS cords and not ejected out;Material enters airway, passes BELOW cords without attempt by patient to eject out (silent aspiration) Pharyngeal- Nectar Straw Pharyngeal residue - valleculae;Pharyngeal residue - pyriform;Reduced epiglottic inversion;Reduced anterior laryngeal mobility;Reduced airway/laryngeal closure;Reduced tongue base retraction Pharyngeal Material enters airway, CONTACTS cords and not ejected out;Material enters airway, passes BELOW cords without attempt by patient to eject out (silent aspiration) Pharyngeal- Thin Teaspoon NT Pharyngeal -- Pharyngeal- Thin Cup Pharyngeal residue - valleculae;Pharyngeal residue - pyriform;Reduced epiglottic inversion;Reduced anterior laryngeal mobility;Reduced airway/laryngeal closure;Reduced tongue base retraction Pharyngeal Material enters airway, remains ABOVE vocal cords and not ejected out;Material enters airway, CONTACTS cords and not ejected out;Material enters airway, passes BELOW cords without attempt by patient to eject out (silent aspiration) Pharyngeal- Thin Straw Pharyngeal residue - valleculae;Pharyngeal residue - pyriform;Reduced epiglottic inversion;Reduced anterior laryngeal mobility;Reduced airway/laryngeal closure;Reduced tongue base retraction Pharyngeal Material enters airway, remains ABOVE vocal cords and not ejected out Pharyngeal- Puree Pharyngeal residue - valleculae;Reduced epiglottic inversion;Reduced anterior laryngeal mobility;Reduced airway/laryngeal closure;Reduced tongue base retraction Pharyngeal -- Pharyngeal- Mechanical Soft Pharyngeal  residue - valleculae;Reduced epiglottic inversion;Reduced anterior laryngeal mobility;Reduced airway/laryngeal closure;Reduced tongue base retraction Pharyngeal -- Pharyngeal- Regular NT Pharyngeal -- Pharyngeal- Multi-consistency NT Pharyngeal -- Pharyngeal- Pill NT Pharyngeal -- Pharyngeal Comment --  Cervical Esophageal Phase  07/26/2021 Cervical Esophageal Phase WFL Pudding Teaspoon -- Pudding Cup -- Honey Teaspoon -- Honey Cup -- Nectar Teaspoon -- Nectar Cup -- Nectar Straw -- Thin Teaspoon -- Thin Cup -- Thin Straw -- Puree -- Mechanical Soft -- Regular -- Multi-consistency -- Pill -- Cervical Esophageal Comment -- Shanika I. Hardin Negus, [place], [place] Office number [phone] Pager [place] 07/26/2021, 10:25 AM                     DG Swallowing Func-Speech Pathology  Result Date: 07/06/2021 Table formatting from the original result was not included. Objective Swallowing Evaluation: Type of Study: MBS-Modified Barium Swallow Study  Patient Details Name: Brian [name] MRN: [id number] Date of Birth: 02/14/1961 Today's Date: 07/06/2021 Time: SLP Start Time (ACUTE ONLY): 1435 -SLP Stop Time (ACUTE ONLY): 1449 SLP Time Calculation (min) (ACUTE ONLY): 14 min  Past Medical History: No past medical history on file. Past Surgical History: Past Surgical History: Procedure Laterality Date  ANTERIOR CERVICAL DECOMPRESSION/DISCECTOMY FUSION 4 LEVELS N/A 06/29/2021  Procedure: Anterior Cervical Discectomy Fusion - Cervical Three-Cervical Four - Cervical Four- Cervical Five - Cervical Five- Cervical Six - Cervical Six- Cervical Seven;  Surgeon: Earnie Larsson, MD;  Location: [facility];  Service: Neurosurgery;  Laterality: N/A;  HEMATOMA EVACUATION N/A 07/03/2021  Procedure: EXPLORATION OF NECK AND REMOVE BLOOD CLOT;  Surgeon: Earnie Larsson, MD;  Location: [facility];  Service: Neurosurgery;  Laterality: N/A; HPI: 61 y/o admitted 06/29/21 following C3-7 ACDF after progressive cervical myelopathy  symptoms with subsequent fall and severe incomplete SCI. No significant PMH.Repeat MBS for possible advancement.  No data recorded  Recommendations for follow up therapy are one component of a multi-disciplinary discharge planning process, led by the attending physician.  Recommendations may be updated based on patient status, additional functional criteria and insurance authorization. Assessment / Plan / Recommendation Clinical Impressions 07/06/2021 Clinical Impression Pt's pharyngeal swallow function has improved from prior study with significantly reduced pharyngeal edema. There was no aspiration but laryngeal penetration with cup and straw sip thin (PAS 3) due to decreased laryngeal closure. Intentional coughs were unsuccessful to clear and cervical collar prevented chin tuck. Nectar thick increased coordination of overall swallow did not enter airway. Residual in valleculae and pyriform sinuses was minimal. Oral mastication and manipulation were mildly delayed. Pt may initiate Dys 2, nectar thick liquids, pills crushed in puree and assist with feeding. ST will follow. SLP Visit Diagnosis Dysphagia, pharyngeal phase (R13.13) Attention and concentration deficit following -- Frontal lobe and executive function deficit following -- Impact on safety and function --   Treatment Recommendations 07/06/2021 Treatment Recommendations Therapy as outlined in treatment plan below   Prognosis 07/06/2021 Prognosis for Safe Diet Advancement Good Barriers to Reach Goals -- Barriers/Prognosis Comment -- Diet Recommendations 07/06/2021 SLP Diet Recommendations Dysphagia 2 (Fine chop) solids;Nectar thick liquid Liquid Administration via Cup;Straw Medication Administration Crushed with puree Compensations Slow rate;Small sips/bites;Minimize environmental distractions;Clear throat intermittently Postural Changes Seated upright at 90 degrees   Other Recommendations 07/06/2021 Recommended Consults -- Oral Care Recommendations Oral care  BID Other Recommendations -- Follow Up Recommendations Acute inpatient rehab (3hours/day) Assistance recommended at discharge -- Functional Status Assessment Patient has had a recent decline in their functional status and demonstrates the ability to make significant improvements in function in a reasonable and predictable amount of time. Frequency and Duration  07/06/2021 Speech Therapy Frequency (ACUTE ONLY) min 2x/week Treatment Duration 2 weeks   Oral Phase 07/06/2021 Oral Phase Impaired Oral - Pudding Teaspoon -- Oral - Pudding Cup -- Oral - Honey Teaspoon -- Oral - Honey Cup -- Oral - Nectar Teaspoon WFL Oral - Nectar Cup WFL Oral - Nectar Straw -- Oral - Thin Teaspoon WFL Oral - Thin Cup WFL Oral - Thin Straw WFL Oral - Puree -- Oral - Mech Soft Delayed oral transit Oral - Regular -- Oral - Multi-Consistency -- Oral - Pill -- Oral Phase - Comment --  Pharyngeal Phase 07/06/2021 Pharyngeal Phase Impaired Pharyngeal- Pudding Teaspoon -- Pharyngeal -- Pharyngeal- Pudding Cup -- Pharyngeal -- Pharyngeal- Honey Teaspoon -- Pharyngeal -- Pharyngeal- Honey Cup -- Pharyngeal -- Pharyngeal- Nectar Teaspoon Pharyngeal residue - valleculae Pharyngeal Material does not enter airway Pharyngeal- Nectar Cup Pharyngeal residue - pyriform Pharyngeal Material does not enter airway Pharyngeal- Nectar Straw NT Pharyngeal -- Pharyngeal- Thin Teaspoon Pharyngeal residue - valleculae;Pharyngeal residue - pyriform Pharyngeal Material does not  enter airway Pharyngeal- Thin Cup Pharyngeal residue - valleculae;Pharyngeal residue - pyriform;Penetration/Aspiration during swallow Pharyngeal Material enters airway, remains ABOVE vocal cords and not ejected out Pharyngeal- Thin Straw Penetration/Aspiration during swallow;Pharyngeal residue - valleculae;Reduced airway/laryngeal closure Pharyngeal Material enters airway, remains ABOVE vocal cords and not ejected out Pharyngeal- Puree NT Pharyngeal -- Pharyngeal- Mechanical Soft WFL Pharyngeal  Material does not enter airway Pharyngeal- Regular NT Pharyngeal -- Pharyngeal- Multi-consistency NT Pharyngeal -- Pharyngeal- Pill NT Pharyngeal -- Pharyngeal Comment --  Cervical Esophageal Phase  07/06/2021 Cervical Esophageal Phase WFL Pudding Teaspoon -- Pudding Cup -- Honey Teaspoon -- Honey Cup -- Nectar Teaspoon -- Nectar Cup -- Nectar Straw -- Thin Teaspoon -- Thin Cup -- Thin Straw -- Puree -- Mechanical Soft -- Regular -- Multi-consistency -- Pill -- Cervical Esophageal Comment -- [place] Siren 07/06/2021, 4:16 PM                     Overnight EEG with video  Result Date: 07/09/2021 Samuella Cota, MD     07/09/2021  8:38 AM EEG Procedure CPT/Type of Study: [id number]; 24hr EEG with video Referring Provider: Pool Primary Neurological Diagnosis: spells History: This is a 61 yr old patient, undergoing an EEG to evaluate for spells of altered awareness. Clinical State: disoriented Technical Description: The EEG was performed using standard setting per the guidelines of American Clinical Neurophysiology Society (ACNS). A minimum of 21 electrodes were placed on scalp according to the International 10-20 or/and 10-10 Systems. Supplemental electrodes were placed as needed. Single EKG electrode was also used to detect cardiac arrhythmia. Patient's behavior was continuously recorded on video simultaneously with EEG. A minimum of 16 channels were used for data display. Each epoch of study was reviewed manually daily and as needed using standard referential and bipolar montages. Computerized quantitative EEG analysis (such as compressed spectral array analysis, trending, automated spike & seizure detection) were used as indicated. Day 1: from [place] 07/08/21 to 0730 07/09/21 EEG Description: Overall Amplitude:Normal Predominant Frequency: The background activity showed posterior dominant alpha, with about 7-8 Hz, that was rare. Superimposed Frequencies: frequent theta and some delta activity bilaterally The background  was symmetric Background Abnormalities: Generalized slowing as above Rhythmic or periodic pattern: No Epileptiform activity: no Electrographic seizures: no Events: no Breach rhythm: no Reactivity: Present Stimulation procedures: Hyperventilation: not done Photic stimulation: not done Sleep Background: Stage II EKG:no significant arrhythmia Impression: This was an abnormal continuous video EEG due to generalized slowing, indicative of a non-specific encephalopathy pattern. There were no seizures or epileptiform discharges.   DG C-Arm 1-60 Min-No Report  Result Date: 06/29/2021 Fluoroscopy was utilized by the requesting physician.  No radiographic interpretation.   DG C-Arm 1-60 Min-No Report  Result Date: 06/29/2021 Fluoroscopy was utilized by the requesting physician.  No radiographic interpretation.   DG C-Arm 1-60 Min-No Report  Result Date: 06/29/2021 Fluoroscopy was utilized by the requesting physician.  No radiographic interpretation.   ECHOCARDIOGRAM COMPLETE  Result Date: 07/16/2021    ECHOCARDIOGRAM REPORT   Patient Name:   Brian [name] Date of Exam: 07/16/2021 Medical Rec #:  [id number]        Height:       70.0 in Accession #:    [id number]       Weight:       123.7 lb Date of Birth:  [birth year]       BSA:          1.702 m Patient Age:    74  years         BP:           96/63 mmHg Patient Gender: M                HR:           122 bpm. Exam Location:  Inpatient Procedure: 2D Echo, Cardiac Doppler and Color Doppler Indications:    Other abnormalities of the heart R00.8  History:        Patient has no prior history of Echocardiogram examinations.  Sonographer:    Bernadene Person RDCS Referring Phys: [phone] PAULA B SIMPSON  Sonographer Comments: Echo performed with patient supine and on artificial respirator. IMPRESSIONS  1. Left ventricular ejection fraction, by estimation, is 55 to 60%. The left ventricle has normal function. The left ventricle has no regional wall motion abnormalities. Left  ventricular diastolic parameters are indeterminate. There is the interventricular septum is flattened in diastole ('D' shaped left ventricle), consistent with right ventricular volume overload.  2. + McConnell's sign - suggestive of a large pulmonary embolus . Right ventricular systolic function is severely reduced. The right ventricular size is moderately enlarged. There is mildly elevated pulmonary artery systolic pressure.  3. A small pericardial effusion is present.  4. The mitral valve is normal in structure. Trivial mitral valve regurgitation. No evidence of mitral stenosis.  5. The aortic valve is normal in structure. Aortic valve regurgitation is not visualized. No aortic stenosis is present. FINDINGS  Left Ventricle: Left ventricular ejection fraction, by estimation, is 55 to 60%. The left ventricle has normal function. The left ventricle has no regional wall motion abnormalities. The left ventricular internal cavity size was normal in size. There is  no left ventricular hypertrophy. The interventricular septum is flattened in diastole ('D' shaped left ventricle), consistent with right ventricular volume overload. Left ventricular diastolic parameters are indeterminate. Right Ventricle: + McConnell's sign - suggestive of a large pulmonary embolus. The right ventricular size is moderately enlarged. Right vetricular wall thickness was not well visualized. Right ventricular systolic function is severely reduced. There is mildly elevated pulmonary artery systolic pressure. The tricuspid regurgitant velocity is 2.58 m/s, and with an assumed right atrial pressure of 3 mmHg, the estimated right ventricular systolic pressure is 0000000 mmHg. Left Atrium: Left atrial size was normal in size. Right Atrium: Right atrial size was normal in size. Pericardium: A small pericardial effusion is present. Mitral Valve: The mitral valve is normal in structure. Trivial mitral valve regurgitation. No evidence of mitral valve  stenosis. Tricuspid Valve: The tricuspid valve is grossly normal. Tricuspid valve regurgitation is trivial. Aortic Valve: The aortic valve is normal in structure. Aortic valve regurgitation is not visualized. No aortic stenosis is present. Pulmonic Valve: The pulmonic valve was not well visualized. Pulmonic valve regurgitation is not visualized. Aorta: The aortic root and ascending aorta are structurally normal, with no evidence of dilitation. IAS/Shunts: The atrial septum is grossly normal.  LEFT VENTRICLE PLAX 2D LVIDd:         4.10 cm     Diastology LVIDs:         2.40 cm     LV e' medial:    6.74 cm/s LV PW:         0.80 cm     LV E/e' medial:  13.7 LV IVS:        0.70 cm     LV e' lateral:   7.94 cm/s LVOT diam:     2.10 cm  LV E/e' lateral: 11.6 LV SV:         42 LV SV Index:   25 LVOT Area:     3.46 cm  LV Volumes (MOD) LV vol d, MOD A4C: 44.7 ml LV vol s, MOD A4C: 20.6 ml LV SV MOD A4C:     44.7 ml RIGHT VENTRICLE RV S prime:     9.01 cm/s LEFT ATRIUM             Index       RIGHT ATRIUM           Index LA diam:        1.50 cm 0.88 cm/m  RA Area:     11.20 cm LA Vol (A2C):   11.1 ml 6.52 ml/m  RA Volume:   21.70 ml  12.75 ml/m LA Vol (A4C):   12.9 ml 7.58 ml/m LA Biplane Vol: 11.9 ml 6.99 ml/m  AORTIC VALVE LVOT Vmax:   101.15 cm/s LVOT Vmean:  65.400 cm/s LVOT VTI:    0.120 m  AORTA Ao Root diam: 3.30 cm Ao Asc diam:  3.10 cm MITRAL VALVE               TRICUSPID VALVE MV Area (PHT): 8.62 cm    TR Peak grad:   26.6 mmHg MV Decel Time: 88 msec     TR Vmax:        258.00 cm/s MV E velocity: 92.40 cm/s MV A velocity: 98.50 cm/s  SHUNTS MV E/A ratio:  0.94        Systemic VTI:  0.12 m                            Systemic Diam: 2.10 cm Mertie Moores MD Electronically signed by Mertie Moores MD Signature Date/Time: 07/16/2021/10:58:35 AM    Final    CT HEAD CODE STROKE WO CONTRAST`  Result Date: 07/03/2021 CLINICAL DATA:  Code stroke. EXAM: CT HEAD WITHOUT CONTRAST TECHNIQUE: Contiguous axial images  were obtained from the base of the skull through the vertex without intravenous contrast. RADIATION DOSE REDUCTION: This exam was performed according to the departmental dose-optimization program which includes automated exposure control, adjustment of the mA and/or kV according to patient size and/or use of iterative reconstruction technique. COMPARISON:  06/19/2021 FINDINGS: Brain: There is no acute intracranial hemorrhage, mass effect, or edema. No new loss of gray-white differentiation. Ventricles and sulci are stable in size and configuration. No extra-axial collection. Vascular: No hyperdense vessel. Skull: Unremarkable. Sinuses/Orbits: No acute abnormality. Other: Mastoid air cells are clear. ASPECTS ([place] Stroke Program Early CT Score) - Ganglionic level infarction (caudate, lentiform nuclei, internal capsule, insula, M1-M3 cortex): 7 - Supraganglionic infarction (M4-M6 cortex): 3 Total score (0-10 with 10 being normal): 10 IMPRESSION: There is no acute intracranial hemorrhage or evidence of acute infarction. ASPECT score is 10. These results were communicated to Dr. Reeves Forth At 8:15 am on 07/03/2021 by text page via the [facility] messaging system. Electronically Signed   By: Macy Mis M.D.   On: 07/03/2021 08:16   VAS [place] LOWER EXTREMITY VENOUS (DVT)  Result Date: 07/16/2021  Lower Venous DVT Study Patient Name:  Brian [name]  Date of Exam:   07/15/2021 Medical Rec #: [id number]         Accession #:    [id number] Date of Birth: Sep 28, 1960        Patient Gender: M Patient Age:   10 years  Exam Location:  [facility] Procedure:      VAS [place] LOWER EXTREMITY VENOUS (DVT) Referring Phys: Bretta Bang GONFA --------------------------------------------------------------------------------  Indications: Pulmonary embolism.  Comparison Study: No prior study Performing Technologist: Sharion Dove RVS  Examination Guidelines: A complete evaluation includes B-mode imaging, spectral Doppler, color Doppler, and power  Doppler as needed of all accessible portions of each vessel. Bilateral testing is considered an integral part of a complete examination. Limited examinations for reoccurring indications may be performed as noted. The reflux portion of the exam is performed with the patient in reverse Trendelenburg.  +---------+---------------+---------+-----------+----------+--------------+  RIGHT     Compressibility Phasicity Spontaneity Properties Thrombus Aging  +---------+---------------+---------+-----------+----------+--------------+  CFV       Full            Yes       Yes                                    +---------+---------------+---------+-----------+----------+--------------+  SFJ       Full                                                             +---------+---------------+---------+-----------+----------+--------------+  FV Prox   Full                                                             +---------+---------------+---------+-----------+----------+--------------+  FV Mid    Full                                                             +---------+---------------+---------+-----------+----------+--------------+  FV Distal Full                                                             +---------+---------------+---------+-----------+----------+--------------+  PFV       Full                                                             +---------+---------------+---------+-----------+----------+--------------+  POP       Full            Yes       Yes                                    +---------+---------------+---------+-----------+----------+--------------+  PTV       Full                                                             +---------+---------------+---------+-----------+----------+--------------+  PERO      Full                                                             +---------+---------------+---------+-----------+----------+--------------+    +---------+---------------+---------+-----------+----------+--------------+  LEFT      Compressibility Phasicity Spontaneity Properties Thrombus Aging  +---------+---------------+---------+-----------+----------+--------------+  CFV       Full            Yes       Yes                                    +---------+---------------+---------+-----------+----------+--------------+  SFJ       Full                                                             +---------+---------------+---------+-----------+----------+--------------+  FV Prox   Full                                                             +---------+---------------+---------+-----------+----------+--------------+  FV Mid    Full                                                             +---------+---------------+---------+-----------+----------+--------------+  FV Distal Full                                                             +---------+---------------+---------+-----------+----------+--------------+  PFV       Full                                                             +---------+---------------+---------+-----------+----------+--------------+  POP       Full            Yes       Yes                                    +---------+---------------+---------+-----------+----------+--------------+  PTV       Full                                                             +---------+---------------+---------+-----------+----------+--------------+  PERO      Full                                                             +---------+---------------+---------+-----------+----------+--------------+     Summary: BILATERAL: - No evidence of deep vein thrombosis seen in the lower extremities, bilaterally. -No evidence of popliteal cyst, bilaterally.   *See table(s) above for measurements and observations. Electronically signed by Monica [name] MD on 07/16/2021 at 2:31:24 PM.    Final    ECHOCARDIOGRAM LIMITED  Result Date: 07/25/2021     ECHOCARDIOGRAM LIMITED REPORT   Patient Name:   Brian [name] Date of Exam: 07/25/2021 Medical Rec #:  [id number]        Height:       70.0 in Accession #:    [id number]       Weight:       116.0 lb Date of Birth:  Dec 28, 1960       BSA:          1.656 m Patient Age:    78 years         BP:           137/87 mmHg Patient Gender: M                HR:           110 bpm. Exam Location:  Inpatient Procedure: Limited Echo Indications:    Pulmonary embolus  History:        Patient has prior history of Echocardiogram examinations, most                 recent 07/16/2021.  Sonographer:    Jefferey Pica Referring Phys: [place]  1. Left ventricular ejection fraction, by estimation, is 60 to 65%. The left ventricle has normal function.  2. The RV size and contractility appear normal. No evidence of McConnell's sign. The IVC is small and collapses with inspiration. . Right ventricular systolic function is normal. The right ventricular size is normal.  3. The aortic valve is tricuspid. Aortic valve regurgitation is not visualized. No aortic stenosis is present. FINDINGS  Left Ventricle: Left ventricular ejection fraction, by estimation, is 60 to 65%. The left ventricle has normal function. The left ventricular internal cavity size was normal in size. Right Ventricle: The RV size and contractility appear normal. No evidence of McConnell's sign. The IVC is small and collapses with inspiration. The right ventricular size is normal. Right vetricular wall thickness was not well visualized. Right ventricular systolic function is normal. Aortic Valve: The aortic valve is tricuspid. Aortic valve regurgitation is not visualized. No aortic stenosis is present. Aorta: The aortic root and ascending aorta are structurally normal, with no evidence of dilitation. LEFT VENTRICLE PLAX 2D LVIDd:         4.30 cm LVIDs:         2.30 cm LV PW:         1.00 cm LV IVS:        0.90 cm LVOT diam:     2.10 cm LVOT Area:      3.46 cm  IVC IVC diam: 1.50 cm LEFT ATRIUM           Index  RIGHT ATRIUM           Index LA diam:      2.60 cm 1.57 cm/m   RA Area:     10.50 cm LA Vol (A2C): 46.5 ml 28.08 ml/m  RA Volume:   20.80 ml  12.56 ml/m   AORTA Ao Root diam: 3.50 cm Ao Asc diam:  3.30 cm  SHUNTS Systemic Diam: 2.10 cm Mertie Moores MD Electronically signed by Mertie Moores MD Signature Date/Time: 07/25/2021/4:31:29 PM    Final    US Abdomen Limited RUQ (LIVER/GB)  Result Date: 07/24/2021 CLINICAL DATA:  Abnormal liver function tests. EXAM: ULTRASOUND ABDOMEN LIMITED RIGHT UPPER QUADRANT COMPARISON:  None. FINDINGS: Gallbladder: No gallstones or wall thickening visualized. No sonographic Murphy sign noted by sonographer. Common bile duct: Diameter: 3 mm. Liver: No focal lesion identified. Within normal limits in parenchymal echogenicity. Portal vein is patent on color Doppler imaging with normal direction of blood flow towards the liver. Other: At least trace bilateral pleural effusions. IMPRESSION: 1. At least bilateral trace pleural effusions. 2. Otherwise unremarkable right upper quadrant ultrasound. Electronically Signed   By: Iven Finn M.D.   On: 07/24/2021 15:19    Labs:  CBC: Recent Labs    07/22/21 0128 07/23/21 0451 07/24/21 0231 07/27/21 0348  WBC 13.3* 12.9* 13.8* 13.8*  HGB 7.9* 9.1* 9.6* 9.3*  HCT 23.5* 26.7* 29.2* 28.4*  PLT 360 459* 567* 743*    COAGS: Recent Labs    07/20/21 0341 07/21/21 0330 07/22/21 0128 07/23/21 0451  APTT 58* 63* 71* 62*    BMP: Recent Labs    07/24/21 0231 07/25/21 0758 07/26/21 0334 07/27/21 0348  NA 138 139   139 140 136  K 3.5 3.2*   3.2* 3.9 3.6  CL 100 107   107 109 105  CO2 29 23   22  21* 22  GLUCOSE 102* 124*   127* 110* 112*  BUN 19 22*   22* 20 15  CALCIUM 9.3 9.4   9.4 9.4 9.4  CREATININE 0.64 0.67   0.65 0.59* 0.51*  GFRNONAA >60 >60   >60 >60 >60    LIVER FUNCTION TESTS: Recent Labs    07/17/21 0147 07/18/21 0643  07/24/21 0231 07/25/21 0758 07/26/21 0334 07/27/21 0348  BILITOT 1.3*  --  0.5 0.6  --  0.4  AST 32  --  116* 109*  --  100*  ALT 24  --  93* 103*  --  108*  ALKPHOS 113  --  349* 324*  --  296*  PROT 6.1*  --  7.9 8.2*  --  7.3  ALBUMIN 1.5*   < > 2.0* 2.2*   2.2* 2.2* 2.2*   < > = values in this interval not displayed.    Assessment and Plan: Tracheal Aspiration --dependent on tube feeds, malnutrition --needs transition to G-tube --will set him up for tube to be placed tentatively on Monday in IR, pending obtaining consent, will continue to attempt reaching family. --Stop tube feed 0000 07/30/21, order placed --Hold Eliquis 48 hours   Thank you for this interesting consult.  I greatly enjoyed meeting Brian [name] and look forward to participating in their care.  A copy of this report was sent to the requesting provider on this date.  Electronically Signed: Pasty Spillers, PA 07/27/2021, 10:36 AM   I spent a total of 40 Minutes in face to face in clinical consultation, greater than 50% of which was counseling/coordinating care for percutaneous  gastrostomy tube placement.

## 2021-07-27 NOTE — Progress Notes (Signed)
Providing Compassionate, Quality Care - Together   Subjective: Patient significantly more sedated during today's exam. MAR shows he was given 2 mg clonazepam and 50 mg Seroquel around 0930. Patient not able to fully participate in assessment.  Objective: Vital signs in last 24 hours: Temp:  [97.6 F (36.4 C)-98.3 F (36.8 C)] 98.3 F (36.8 C) (03/03 1109) Pulse Rate:  [91-114] 91 (03/03 1109) Resp:  [14-20] 14 (03/03 1109) BP: (117-139)/(85-98) 117/85 (03/03 1109) SpO2:  [100 %] 100 % (03/03 0803)  Intake/Output from previous day: 03/02 0701 - 03/03 0700 In: 873.5 [I.V.:873.5] Out: 1525 [Urine:1525] Intake/Output this shift: No intake/output data recorded.  Responds to voice PERRLA Abdomen soft, distended, TTP MAE, generalized weakness Decreased fine motor Incision is healing well  Lab Results: Recent Labs    07/27/21 0348  WBC 13.8*  HGB 9.3*  HCT 28.4*  PLT 743*   BMET Recent Labs    07/26/21 0334 07/27/21 0348  NA 140 136  K 3.9 3.6  CL 109 105  CO2 21* 22  GLUCOSE 110* 112*  BUN 20 15  CREATININE 0.59* 0.51*  CALCIUM 9.4 9.4    Studies/Results: DG Swallowing Func-Speech Pathology  Result Date: 07/26/2021 Table formatting from the original result was not included. Objective Swallowing Evaluation: Type of Study: MBS-Modified Barium Swallow Study  Patient Details Name: Brian [name] MRN: [id number] Date of Birth: 1961/01/06 Today's Date: 07/26/2021 Time: SLP Start Time (ACUTE ONLY): [id number] -SLP Stop Time (ACUTE ONLY): 0905 SLP Time Calculation (min) (ACUTE ONLY): 30 min Past Medical History: No past medical history on file. Past Surgical History: Past Surgical History: Procedure Laterality Date  ANTERIOR CERVICAL DECOMPRESSION/DISCECTOMY FUSION 4 LEVELS N/A 06/29/2021  Procedure: Anterior Cervical Discectomy Fusion - Cervical Three-Cervical Four - Cervical Four- Cervical Five - Cervical Five- Cervical Six - Cervical Six- Cervical Seven;  Surgeon: Earnie Larsson,  MD;  Location: [facility];  Service: Neurosurgery;  Laterality: N/A;  HEMATOMA EVACUATION N/A 07/03/2021  Procedure: EXPLORATION OF NECK AND REMOVE BLOOD CLOT;  Surgeon: Earnie Larsson, MD;  Location: [facility];  Service: Neurosurgery;  Laterality: N/A; HPI: Pt is a 61 y/o admitted 06/29/21 following C3-7 ACDF after progressive cervical myelopathy symptoms with subsequent fall and severe incomplete SCI. Significant prevertebral swelling per imaging. Pt developed some difficulty swallowing on 2/5 and SLP consulted. MBS 2/6 revealed severe edema s/p ACDF, oral holding, a pharyngeal delay, inconsistent hyolaryngeal elevation, incomplete epiglottic inversion due to edema. Aspiration noted accross consistencies and and NPO status was recommended. Repeat MBS 2/10 showed significantly reduced pharyngeal edema and a dysphagia 2 diet with nectar thick liquids was initiated. Pt transitioned to thin liquids 2/16 and susbequently returned to nectar thick 2/17 due to concern for aspiration. Pt developed pna, possible aspiration, requiring intubation 2/19-26. Cortrak placed 2/20. No significant PMH.  No data recorded  Recommendations for follow up therapy are one component of a multi-disciplinary discharge planning process, led by the attending physician.  Recommendations may be updated based on patient status, additional functional criteria and insurance authorization. Assessment / Plan / Recommendation Clinical Impressions 07/26/2021 Clinical Impression Pt presents with oropharyngeal dysphagia which is notably worse compared to the MBS on 2/10. The impact of mentation and prolonged intubation on this is considered. Prevertebral edema is further improved compared to the MBS on 2/10 and a cortrak was present during both studies so this is likely not a significant factor in worsened function. Oral holding, weak bolus manipulation, reduced lingual retraction, and reduced anterior laryngeal movement were  noted. He demonstrated lingual residue,  vallecular residue, pyriform sinus residue, and reduced epiglottic inversion which resulted in reduced airway protection.  In the absence of a liquid wash, pt was unable to transport puree and dysphagia 3 boluses past the valleculae. He exhibited penetration (PAS 3, 5) and subsequent silent aspiration (PAS 8) with all liquids via tsp, cup and straw. The aspirate often originated from penetrate on the laryngeal surface of the epiglottis or from pyriform sinus residue after the swallow. Despite frequency of aspiration, no throat clearing/coughing was noted, and prompted coughing was ineffective. Airway protection and laryngeal invasion were improved with a left head turn posture. This was able to eliminate penetration and aspiration with honey thick and nectar thick liquids via cup. However, pt exhibited significant difficulty demonstrating and maintaining this despite consistent use of verbal and/or tactile cues. It is recommended that the pt's NPO status be maintained at this time, but that ice chips continue to be allowed after thorough oral care. SLP will follow pt for dysphagia treatment. SLP Visit Diagnosis Dysphagia, oropharyngeal phase (R13.12) Attention and concentration deficit following -- Frontal lobe and executive function deficit following -- Impact on safety and function Severe aspiration risk   Treatment Recommendations 07/26/2021 Treatment Recommendations Therapy as outlined in treatment plan below   Prognosis 07/26/2021 Prognosis for Safe Diet Advancement Good Barriers to Reach Goals Cognitive deficits Barriers/Prognosis Comment -- Diet Recommendations 07/26/2021 SLP Diet Recommendations NPO Liquid Administration via -- Medication Administration Via alternative means Compensations -- Postural Changes --   Other Recommendations 07/26/2021 Recommended Consults -- Oral Care Recommendations Oral care QID Other Recommendations -- Follow Up Recommendations Acute inpatient rehab (3hours/day) Assistance recommended  at discharge -- Functional Status Assessment Patient has had a recent decline in their functional status and demonstrates the ability to make significant improvements in function in a reasonable and predictable amount of time. Frequency and Duration  07/26/2021 Speech Therapy Frequency (ACUTE ONLY) min 2x/week Treatment Duration 2 weeks   Oral Phase 07/26/2021 Oral Phase Impaired Oral - Pudding Teaspoon -- Oral - Pudding Cup -- Oral - Honey Teaspoon -- Oral - Honey Cup -- Oral - Nectar Teaspoon Holding of bolus;Delayed oral transit Oral - Nectar Cup Holding of bolus;Delayed oral transit Oral - Nectar Straw -- Oral - Thin Teaspoon NT Oral - Thin Cup Decreased velopharyngeal closure Oral - Thin Straw Holding of bolus;Delayed oral transit Oral - Puree Holding of bolus;Delayed oral transit Oral - Mech Soft Holding of bolus;Delayed oral transit;Impaired mastication Oral - Regular NT Oral - Multi-Consistency -- Oral - Pill -- Oral Phase - Comment --  Pharyngeal Phase 07/26/2021 Pharyngeal Phase Impaired Pharyngeal- Pudding Teaspoon -- Pharyngeal -- Pharyngeal- Pudding Cup -- Pharyngeal -- Pharyngeal- Honey Teaspoon -- Pharyngeal -- Pharyngeal- Honey Cup -- Pharyngeal -- Pharyngeal- Nectar Teaspoon Pharyngeal residue - valleculae;Pharyngeal residue - pyriform;Reduced epiglottic inversion;Reduced anterior laryngeal mobility;Reduced airway/laryngeal closure;Reduced tongue base retraction Pharyngeal Material enters airway, remains ABOVE vocal cords and not ejected out;Material enters airway, CONTACTS cords and not ejected out;Material enters airway, passes BELOW cords without attempt by patient to eject out (silent aspiration) Pharyngeal- Nectar Cup Pharyngeal residue - valleculae;Pharyngeal residue - pyriform;Reduced epiglottic inversion;Reduced anterior laryngeal mobility;Reduced airway/laryngeal closure;Reduced tongue base retraction Pharyngeal Material enters airway, remains ABOVE vocal cords and not ejected out;Material enters  airway, CONTACTS cords and not ejected out;Material enters airway, passes BELOW cords without attempt by patient to eject out (silent aspiration) Pharyngeal- Nectar Straw Pharyngeal residue - valleculae;Pharyngeal residue - pyriform;Reduced epiglottic inversion;Reduced anterior laryngeal mobility;Reduced airway/laryngeal closure;Reduced  tongue base retraction Pharyngeal Material enters airway, CONTACTS cords and not ejected out;Material enters airway, passes BELOW cords without attempt by patient to eject out (silent aspiration) Pharyngeal- Thin Teaspoon NT Pharyngeal -- Pharyngeal- Thin Cup Pharyngeal residue - valleculae;Pharyngeal residue - pyriform;Reduced epiglottic inversion;Reduced anterior laryngeal mobility;Reduced airway/laryngeal closure;Reduced tongue base retraction Pharyngeal Material enters airway, remains ABOVE vocal cords and not ejected out;Material enters airway, CONTACTS cords and not ejected out;Material enters airway, passes BELOW cords without attempt by patient to eject out (silent aspiration) Pharyngeal- Thin Straw Pharyngeal residue - valleculae;Pharyngeal residue - pyriform;Reduced epiglottic inversion;Reduced anterior laryngeal mobility;Reduced airway/laryngeal closure;Reduced tongue base retraction Pharyngeal Material enters airway, remains ABOVE vocal cords and not ejected out Pharyngeal- Puree Pharyngeal residue - valleculae;Reduced epiglottic inversion;Reduced anterior laryngeal mobility;Reduced airway/laryngeal closure;Reduced tongue base retraction Pharyngeal -- Pharyngeal- Mechanical Soft Pharyngeal residue - valleculae;Reduced epiglottic inversion;Reduced anterior laryngeal mobility;Reduced airway/laryngeal closure;Reduced tongue base retraction Pharyngeal -- Pharyngeal- Regular NT Pharyngeal -- Pharyngeal- Multi-consistency NT Pharyngeal -- Pharyngeal- Pill NT Pharyngeal -- Pharyngeal Comment --  Cervical Esophageal Phase  07/26/2021 Cervical Esophageal Phase WFL Pudding Teaspoon  -- Pudding Cup -- Honey Teaspoon -- Honey Cup -- Nectar Teaspoon -- Nectar Cup -- Nectar Straw -- Thin Teaspoon -- Thin Cup -- Thin Straw -- Puree -- Mechanical Soft -- Regular -- Multi-consistency -- Pill -- Cervical Esophageal Comment -- Shanika I. Hardin Negus, [place], [place] Office number [phone] Pager [phone] Horton Marshall 07/26/2021, 10:25 AM                     ECHOCARDIOGRAM LIMITED  Result Date: 07/25/2021    ECHOCARDIOGRAM LIMITED REPORT   Patient Name:   Brian [name] Date of Exam: 07/25/2021 Medical Rec #:  [id number]        Height:       70.0 in Accession #:    [id number]       Weight:       116.0 lb Date of Birth:  [birth year]       BSA:          1.656 m Patient Age:    26 years         BP:           137/87 mmHg Patient Gender: M                HR:           110 bpm. Exam Location:  Inpatient Procedure: Limited Echo Indications:    Pulmonary embolus  History:        Patient has prior history of Echocardiogram examinations, most                 recent 07/16/2021.  Sonographer:    Jefferey Pica Referring Phys: [place]  1. Left ventricular ejection fraction, by estimation, is 60 to 65%. The left ventricle has normal function.  2. The RV size and contractility appear normal. No evidence of McConnell's sign. The IVC is small and collapses with inspiration. . Right ventricular systolic function is normal. The right ventricular size is normal.  3. The aortic valve is tricuspid. Aortic valve regurgitation is not visualized. No aortic stenosis is present. FINDINGS  Left Ventricle: Left ventricular ejection fraction, by estimation, is 60 to 65%. The left ventricle has normal function. The left ventricular internal cavity size was normal in size. Right Ventricle: The RV size and contractility appear normal. No evidence of McConnell's sign. The IVC is small and  collapses with inspiration. The right ventricular size is normal. Right vetricular wall  thickness was not well visualized. Right ventricular systolic function is normal. Aortic Valve: The aortic valve is tricuspid. Aortic valve regurgitation is not visualized. No aortic stenosis is present. Aorta: The aortic root and ascending aorta are structurally normal, with no evidence of dilitation. LEFT VENTRICLE PLAX 2D LVIDd:         4.30 cm LVIDs:         2.30 cm LV PW:         1.00 cm LV IVS:        0.90 cm LVOT diam:     2.10 cm LVOT Area:     3.46 cm  IVC IVC diam: 1.50 cm LEFT ATRIUM           Index        RIGHT ATRIUM           Index LA diam:      2.60 cm 1.57 cm/m   RA Area:     10.50 cm LA Vol (A2C): 46.5 ml 28.08 ml/m  RA Volume:   20.80 ml  12.56 ml/m   AORTA Ao Root diam: 3.50 cm Ao Asc diam:  3.30 cm  SHUNTS Systemic Diam: 2.10 cm Mertie Moores MD Electronically signed by Mertie Moores MD Signature Date/Time: 07/25/2021/4:31:29 PM    Final     Assessment/Plan: Patient status post C3-4, C4-5, C5-6, C6-7 anterior cervical discectomy with interbody fusion by Dr. Annette Stable on 06/29/2021. Increased difficulty swallowing with lung atelectasis and elevated temperatures on 07/02/2021. Made NPO following MBS by SLP. 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 CTA head and neck negative for stroke. MRI revealed epidural hematoma. Patient underwent exploration of his cervical fusion with evacuation of the epidural hematoma on 07/03/2021. Cortrak was placed on 07/04/2021. Patient's strength much improved since epidural hematoma evacuation. Cortrak removed 07/06/2021 and dysphagia 2 diet with nectar thick liquids started. Patient with delirium vs ETOH withdrawal. Discontinued steroids 07/06/2021. CIWA protocol started and discontinued 07/07/2021. CT and MRI 07/07/2021 negative. Patient developed respiratory distress, requiring intubation on 07/15/2021. Work up revealed aspiration PNA and PE. Patient was extubated on 07/22/2021.Foley catheter removed 07/24/2021. Patient  transferred to 3W on 07/26/2021.   LOS: 28 days   - Recommend removing Flexi-Seal as patient is draining around it.This likely means there is thicker stool that is causing the looser stool to drain around the tube. It was placed on 07/18/2021. If kept in, should not remain in longer than 28 days (08/15/2021). -With patient's incomplete spinal cord injury, he may not be able to fully empty his bladder. Order has been placed to bladder scan for post void residuals. If PVRs remain high, patient will likely need q6 hour catheterization. -Patient's son is working on a Medicaid application so patient can be placed in a SNF for further rehabilitation.   Viona Gilmore, DNP, AGNP-C Nurse Practitioner  [facility] [place] [address], [address], [place], [place] [address] P: [phone]     F: [phone]  07/27/2021, 11:19 AM

## 2021-07-27 NOTE — Progress Notes (Signed)
Speech Language Pathology Treatment: Dysphagia  ?Patient Details ?Name: Brian [name] ?MRN: [id number] ?DOB: August 15, 1960 ?Today's Date: 07/27/2021 ?Time: 3212-2482 ?SLP Time Calculation (min) (ACUTE ONLY): 14 min ? ?Assessment / Plan / Recommendation ?Clinical Impression ? Pt was seen for dysphagia treatment. He was alert at the beginning of the session, but this waned as the session progressed, and treatment was ultimately terminated prematurely for this reason. Oral care was provided. Pt required moderate verbal and tactile cues to demonstrate and maintain a left head turn posture. He exhibited difficulty with completion of effortful swallows, but was able to demonstrate the [facility] with cues. Throat clearing and delayed coughing was noted following repeated trials, suggesting possible aspiration, though silent aspiration was consistently noted during the MBS. SLP will continue to follow pt.   ?  ?HPI HPI: Pt is a 61 y/o admitted 06/29/21 following C3-7 ACDF after progressive cervical myelopathy symptoms with subsequent fall and severe incomplete SCI. Significant prevertebral swelling per imaging. Pt developed some difficulty swallowing on 2/5 and SLP consulted. MBS 2/6 revealed severe edema s/p ACDF, oral holding, a pharyngeal delay, inconsistent hyolaryngeal elevation, incomplete epiglottic inversion due to edema. Aspiration noted accross consistencies and and NPO status was recommended. Repeat MBS 2/10 showed significantly reduced pharyngeal edema and a dysphagia 2 diet with nectar thick liquids was initiated. Pt transitioned to thin liquids 2/16 and susbequently returned to nectar thick 2/17 due to concern for aspiration. Pt developed pna, possible aspiration, requiring intubation 2/19-26. Cortrak placed 2/20. No significant PMH. ?  ?   ?SLP Plan ? Continue with current plan of care ? ?  ?  ?Recommendations for follow up therapy are one component of a multi-disciplinary discharge planning process, led by the  attending physician.  Recommendations may be updated based on patient status, additional functional criteria and insurance authorization. ?  ? ?Recommendations  ?Diet recommendations: NPO (Pt may continue to have ice chips after oral care) ?Medication Administration: Via alternative means  ?   ?    ?   ? ? ? ? Oral Care Recommendations: Oral care QID;Oral care prior to ice chip/H20 ?Follow Up Recommendations: Skilled nursing-short term rehab (<3 hours/day) ?Assistance recommended at discharge: Frequent or constant Supervision/Assistance ?SLP Visit Diagnosis: Dysphagia, pharyngeal phase (R13.13) ?Plan: Continue with current plan of care ? ? ? ? ?  ?  ?Mishti Swanton I. Vear Clock, MS, CCC-SLP ?Acute Rehabilitation Services ?Office number [phone] ?Pager [phone] ? ? ?Scheryl Marten ? ?07/27/2021, 12:54 PM ? ? ? ?

## 2021-07-27 NOTE — Progress Notes (Signed)
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  He was admitted on 2/3 and underwent ACDF of C3-4, C4-5, C5-5, and C6-7.  Post-operatively he was progressing slowly with residual weakness in both hands but improving lower extremity strength and function.  He developed some difficulty swallowing on 2/5 and SLP ordered and placed on thickened liquids.   Overnight 2/6, patient with increased difficulty swallowing and was made NPO but also noted to have dysarthria and difficulty moving extremities with numbness.  SLP evaluated and found to be moderate aspiration risk.   Also progressively tachycardic, tachypneic, and now febrile.  Code stroke activated and taken for CT/ CTA head and neck and was given decadron 51m once.  NIHSS 18.  CTH was negative for acute findings, and CTA head neck did not reveal any LVO but noted significant prevertebral soft tissue swelling with foci of gas present at the ventral epidural space at C4-5, small collection not excluded. On 2/7, patient with worsening confusion and concern for airway involvement, PCCM consulted and remained in the intensive care unit until he is transferred to the floor on 3/2 and [facility] under [facility] service.  Significant Hospital Events: 2/3 ACDF C3-4, C4-5, C5-5, and C6-7 w/ Dr. PAnnette Stable2/6 SLP eval for difficulty swallowing 2/7 Code stroke overnight, neg for LVO, CT showing soft tissue swelling.  PCCM consulted for concern of airway  management and AMS.  Went for evacuation of hematoma  2/18 CT Abd/Pelvis: showing bilateral segmental Pes, started on heparin and showing worsening pneumonia, febrile to  103.  PCT rising, 34.6, restarted on abx 2/19 possible aspiration w/ severe hypoxia; intubated; Switching from heparin to angiomax given subtherapeutic levels despite increase in rate. 2/20 Echo shows normal LV systolic function. There was notation of a + McConnell's sign c/w a large pulmonary embolus which is consistent with the finding of bilateral segmental pulmonary emboli noted on CT 07/14/2021. 2/21 rash appreciated on back; stopped cefepim/vanc switched to zosyn; required low dose levo with increase in fentanyl 2/22 remains intubated; unable to extubate to do increase RR and thick secretions 2/23 [name] infusion added 2/24: weaning fentanyl.  2/26 extubated 2/28 tachycardia persists 3/2: Modified barium swallow, continues n.p.o. with core track in place, likely will need PEG tube.     Assessment & Plan:   Assessment and Plan: * Cervical myelopathy ([place]; severe cervical spinal stenosis with cord compression s/p ACDF CU2-7on 2/3, complicated by epidural hematoma s/p evacuation 2/7 Patient initially presenting to ED after recent fall with progressive upper and lower extremity weakness and tremors, was found to have critical multilevel cervical spinal stenosis C3-3 6 with spinal cord compression and spinal cord signal change and underwent ACDF C3-4, C4-5, C5-5, and C6/7 by neurosurgery Dr. PTrenton Gammonon 06/29/2021.  Postoperative leg complicated by epidural hematoma s/p evacuation on 2/7. --Continue PT/OT/SLP efforts --Further per primary neurosurgery  Acute respiratory failure with hypoxia ([place] Etiology likely multifactorial with significant dysphagia with recurrent aspiration pneumonia events during initial hospitalization following ACDF surgery with postoperative complication of  cervical hematoma.  Patient did  require ventilatory support in the intensive care unit and was successfully extubated on 07/22/2021.  Patient completed extensive course of empiric antibiotics.   --Aspiration precautions --Continue supplemental oxygen, maintain SPO2 greater than 92%, currently on 3 L nasal cannula with SPO2 99% at rest; wean to room air    Acute pulmonary embolism (HCC) Bilateral segmental PE noted on CTA chest.  LE [place] negative for DVT.  TTE with normal LV systolic function, notation of positive McConnell sign consistent with a large pulmonary embolism which is consistent with the finding of bilateral segmental pulmonary emboli at on CT. --Eliquis $RemoveBefor'5mg'cziehFLesfdo$  BID; plan to hold 48 hours prior to planned G-tube placement by IR on 3/6 --Currently on 3 L nasal cannula, continue to wean for goal SPO2 92% or greater  Acute metabolic encephalopathy CT head, MRI brain, EEG, TSH, B12, ammonia unrevealing.  Etiology likely multifactorial with acute respiratory failure secondary to aspiration pneumonia, bilateral pulmonary embolism.  Completed course of antibiotics. --Decrease Seroquel to 25 mg BID  Severe sepsis due to recurrent aspiration pneumonia Likely recurrent issue during hospitalization, remains n.p.o. due to continued aspiration risk per SLP.  Completed extensive course of antibiotics while under ICU care. --N.p.o., tube feeds; plan IR gtube Monday 3/6 --Aspiration precautions  Sinus tachycardia Multifactorial including sepsis, dehydration, PE, agitation.   --Metoprolol tartrate 75 mg BID --Continue to monitor on telemetry   Elevated liver enzymes Etiology likely secondary to sepsis from aspiration pneumonia as above.  Acute hepatitis panel negative.  Right upper quadrant ultrasound with no focal liver lesion, parenchymal with normal echogenicity. --Continue monitor LFTs intermittently  Hyponatremia Etiology likely secondary to dehydration.  Urine sodium low.  Improved with IV fluids.  Sodium now normalized on  tube feeds. --Continue intermittent monitoring of CMP  Tobacco use disorder Encouraged cessation. --Nicotine patch  Physical deconditioning Significant weakness in all extremities as a result of cervical myelopathy and acute illness. --Continue PT/OT efforts, likely will need SNF placement  Urinary retention --Bethanechol --Continue monitor urinary output  Protein-calorie malnutrition, severe ([place]) As evidenced by poor p.o. intake, significant muscle mass and subcu fat loss and significant weight loss (about 16 pounds since this hospitalization). Nutrition Problem: Severe Malnutrition (in the context of social/environmental circumstances) Etiology:  (inadequate energy intake) Signs/Symptoms: mild fat depletion, severe muscle depletion, severe muscle depletion Interventions: Refer to RD note for recommendations  -- Dietitian following, appreciate assistance.  Continues tube feeds via core track tube, likely anticipate need of feeding tube per discussion with SLP; plan IR placement of G-tube on Monday  Hyperglycemia-resolved as of 07/12/2021 Likely due to steroid.  Resolved.  A1c 5.2%.     DVT prophylaxis: SCD's Start: 06/29/21 1231 apixaban (ELIQUIS) tablet 5 mg    Code Status: Full Code Family Communication: No family present at bedside this morning, updated patient's son Brian [name] via telephone extensively yesterday  Disposition Plan:  Level of care: Progressive Status is: Inpatient  Remains inpatient appropriate because: Remains on tube feeds via core track tube, pending G-tube placement by IR on Monday, will need SNF placement, disposition per primary neurosurgery   Procedures:  ACDF C3-7, neurosurgery Dr. Trenton Gammon 2/3 Hematoma evacuation 2/7 Vascular duplex ultrasound bilateral lower extremities 2/19 TTE 2/20 TTE 3/1  Antimicrobials:  Vancomycin 2/7 - 2/8; 2/18 - 2/21 Zosyn 2/21 -2/23 Ampicillin 2/7 -2/11 Metronidazole 2/7 - 2/8; 2/17 -2/18 Cefepime 2/18 -  2/21 Perioperative cefazolin  Subjective: Patient seen examined bedside, sleeping and somnolent.  Awakes, pleasantly confused.  Evaluated  by IR, plan for G-tube placement on Monday.  No family present at bedside, updated patient's son extensively via telephone yesterday with agreement of plan for feeding tube placement followed by SNF placement.  Patient denies chest pain, no shortness of breath, no abdominal pain.  No acute concerns overnight per nursing staff.  Objective: Vitals:   07/26/21 2121 07/27/21 0405 07/27/21 0803 07/27/21 1109  BP:  (!) 137/95 (!) 136/95 117/85  Pulse: (!) 109 95 (!) 101 91  Resp:  20  14  Temp:  97.6 F (36.4 C) 97.7 F (36.5 C) 98.3 F (36.8 C)  TempSrc:  Oral Axillary Oral  SpO2:  100% 100%   Weight:      Height:        Intake/Output Summary (Last 24 hours) at 07/27/2021 1344 Last data filed at 07/27/2021 0630 Gross per 24 hour  Intake --  Output 1525 ml  Net -1525 ml   Filed Weights   07/23/21 1330 07/25/21 0500 07/26/21 0500  Weight: 57.8 kg 52.6 kg 50.6 kg    Examination:  Physical Exam: GEN: NAD, alert, slightly confused, cachectic/chronically ill in appearance appears older than stated age HEENT: NCAT, PERRL, EOMI, sclera clear, dry mucous membranes, core track tube noted in place PULM: CTAB w/o wheezes/crackles, normal respiratory effort, on 3 L [place], with SPO2 99% at rest CV: Tachycardic, regular rhythm w/o M/G/R GI: abd soft, NTND, NABS, no R/G/M MSK: no peripheral edema, decreased muscle strength bilateral upper/lower extremities NEURO: Awake, alert, slightly confused, decreased muscle strength bilateral upper/lower extremities PSYCH: normal mood/affect    Data Reviewed: I have personally reviewed following labs and imaging studies  CBC: Recent Labs  Lab 07/21/21 0330 07/22/21 0128 07/23/21 0451 07/24/21 0231 07/27/21 0348  WBC 13.7* 13.3* 12.9* 13.8* 13.8*  NEUTROABS  --   --   --  8.7*  --   HGB 8.5* 7.9* 9.1* 9.6* 9.3*   HCT 26.3* 23.5* 26.7* 29.2* 28.4*  MCV 97.4 95.9 94.0 95.1 96.6  PLT 374 360 459* 567* 458*   Basic Metabolic Panel: Recent Labs  Lab 07/21/21 0330 07/22/21 0128 07/23/21 0451 07/24/21 0231 07/25/21 0758 07/26/21 0334 07/27/21 0348  NA 141   141 138 137 138 139   139 140 136  K 3.7   3.7 4.0 3.5 3.5 3.2*   3.2* 3.9 3.6  CL 105   105 103 97* 100 107   107 109 105  CO2 $Re'30   29 29 31 29 23   22 'IqM$ 21* 22  GLUCOSE 121*   120* 131* 114* 102* 124*   127* 110* 112*  BUN $Re'14   12 17 15 19 'cCP$ 22*   22* 20 15  CREATININE 0.69   0.69 0.55* 0.62 0.64 0.67   0.65 0.59* 0.51*  CALCIUM 9.0   8.9 8.8* 9.2 9.3 9.4   9.4 9.4 9.4  MG 1.6*  --   --   --   --  1.9 1.7  PHOS 2.9   3.0 3.2 2.3* 2.6 3.0 3.2  --    GFR: Estimated Creatinine Clearance: 70.3 mL/min (A) (by C-G formula based on SCr of 0.51 mg/dL (L)). Liver Function Tests: Recent Labs  Lab 07/23/21 0451 07/24/21 0231 07/25/21 0758 07/26/21 0334 07/27/21 0348  AST  --  116* 109*  --  100*  ALT  --  93* 103*  --  108*  ALKPHOS  --  349* 324*  --  296*  BILITOT  --  0.5 0.6  --  0.4  PROT  --  7.9 8.2*  --  7.3  ALBUMIN 1.7* 2.0* 2.2*   2.2* 2.2* 2.2*   No results for input(s): LIPASE, AMYLASE in the last 168 hours. Recent Labs  Lab 07/24/21 1030  AMMONIA 26   Coagulation Profile: No results for input(s): INR, PROTIME in the last 168 hours. Cardiac Enzymes: No results for input(s): CKTOTAL, CKMB, CKMBINDEX, TROPONINI in the last 168 hours. BNP (last 3 results) No results for input(s): PROBNP in the last 8760 hours. HbA1C: No results for input(s): HGBA1C in the last 72 hours. CBG: Recent Labs  Lab 07/26/21 2004 07/26/21 2329 07/27/21 0413 07/27/21 0801 07/27/21 1126  GLUCAP 106* 110* 95 104* 102*   Lipid Profile: No results for input(s): CHOL, HDL, LDLCALC, TRIG, CHOLHDL, LDLDIRECT in the last 72 hours. Thyroid Function Tests: No results for input(s): TSH, T4TOTAL, FREET4, T3FREE, THYROIDAB in the last 72  hours. Anemia Panel: No results for input(s): VITAMINB12, FOLATE, FERRITIN, TIBC, IRON, RETICCTPCT in the last 72 hours. Sepsis Labs: No results for input(s): PROCALCITON, LATICACIDVEN in the last 168 hours.  Recent Results (from the past 240 hour(s))  Culture, Respiratory w Gram Stain     Status: None   Collection Time: 07/20/21  1:57 PM   Specimen: Tracheal Aspirate; Respiratory  Result Value Ref Range Status   Specimen Description TRACHEAL ASPIRATE  Final   Special Requests NONE  Final   Gram Stain   Final    NO SQUAMOUS EPITHELIAL CELLS SEEN FEW WBC SEEN NO ORGANISMS SEEN    Culture   Final    NO GROWTH 2 DAYS Performed at [facility], [address] [place], [place] [address]    Report Status 07/22/2021 FINAL  Final         Radiology Studies: DG Swallowing Func-Speech Pathology  Result Date: 07/26/2021 Table formatting from the original result was not included. Objective Swallowing Evaluation: Type of Study: MBS-Modified Barium Swallow Study  Patient Details Name: Brian [name] MRN: [id number] Date of Birth: Oct 01, 1960 Today's Date: 07/26/2021 Time: SLP Start Time (ACUTE ONLY): 9629 -SLP Stop Time (ACUTE ONLY): 0905 SLP Time Calculation (min) (ACUTE ONLY): 30 min Past Medical History: No past medical history on file. Past Surgical History: Past Surgical History: Procedure Laterality Date  ANTERIOR CERVICAL DECOMPRESSION/DISCECTOMY FUSION 4 LEVELS N/A 06/29/2021  Procedure: Anterior Cervical Discectomy Fusion - Cervical Three-Cervical Four - Cervical Four- Cervical Five - Cervical Five- Cervical Six - Cervical Six- Cervical Seven;  Surgeon: Earnie Larsson, MD;  Location: [facility];  Service: Neurosurgery;  Laterality: N/A;  HEMATOMA EVACUATION N/A 07/03/2021  Procedure: EXPLORATION OF NECK AND REMOVE BLOOD CLOT;  Surgeon: Earnie Larsson, MD;  Location: [facility];  Service: Neurosurgery;  Laterality: N/A; HPI: Pt is a 61 y/o admitted 06/29/21 following C3-7 ACDF after progressive cervical  myelopathy symptoms with subsequent fall and severe incomplete SCI. Significant prevertebral swelling per imaging. Pt developed some difficulty swallowing on 2/5 and SLP consulted. MBS 2/6 revealed severe edema s/p ACDF, oral holding, a pharyngeal delay, inconsistent hyolaryngeal elevation, incomplete epiglottic inversion due to edema. Aspiration noted accross consistencies and and NPO status was recommended. Repeat MBS 2/10 showed significantly reduced pharyngeal edema and a dysphagia 2 diet with nectar thick liquids was initiated. Pt transitioned to thin liquids 2/16 and susbequently returned to nectar thick 2/17 due to concern for aspiration. Pt developed pna, possible aspiration, requiring intubation 2/19-26. Cortrak placed 2/20. No significant PMH.  No data recorded  Recommendations for follow up  therapy are one component of a multi-disciplinary discharge planning process, led by the attending physician.  Recommendations may be updated based on patient status, additional functional criteria and insurance authorization. Assessment / Plan / Recommendation Clinical Impressions 07/26/2021 Clinical Impression Pt presents with oropharyngeal dysphagia which is notably worse compared to the MBS on 2/10. The impact of mentation and prolonged intubation on this is considered. Prevertebral edema is further improved compared to the MBS on 2/10 and a cortrak was present during both studies so this is likely not a significant factor in worsened function. Oral holding, weak bolus manipulation, reduced lingual retraction, and reduced anterior laryngeal movement were noted. He demonstrated lingual residue, vallecular residue, pyriform sinus residue, and reduced epiglottic inversion which resulted in reduced airway protection.  In the absence of a liquid wash, pt was unable to transport puree and dysphagia 3 boluses past the valleculae. He exhibited penetration (PAS 3, 5) and subsequent silent aspiration (PAS 8) with all liquids  via tsp, cup and straw. The aspirate often originated from penetrate on the laryngeal surface of the epiglottis or from pyriform sinus residue after the swallow. Despite frequency of aspiration, no throat clearing/coughing was noted, and prompted coughing was ineffective. Airway protection and laryngeal invasion were improved with a left head turn posture. This was able to eliminate penetration and aspiration with honey thick and nectar thick liquids via cup. However, pt exhibited significant difficulty demonstrating and maintaining this despite consistent use of verbal and/or tactile cues. It is recommended that the pt's NPO status be maintained at this time, but that ice chips continue to be allowed after thorough oral care. SLP will follow pt for dysphagia treatment. SLP Visit Diagnosis Dysphagia, oropharyngeal phase (R13.12) Attention and concentration deficit following -- Frontal lobe and executive function deficit following -- Impact on safety and function Severe aspiration risk   Treatment Recommendations 07/26/2021 Treatment Recommendations Therapy as outlined in treatment plan below   Prognosis 07/26/2021 Prognosis for Safe Diet Advancement Good Barriers to Reach Goals Cognitive deficits Barriers/Prognosis Comment -- Diet Recommendations 07/26/2021 SLP Diet Recommendations NPO Liquid Administration via -- Medication Administration Via alternative means Compensations -- Postural Changes --   Other Recommendations 07/26/2021 Recommended Consults -- Oral Care Recommendations Oral care QID Other Recommendations -- Follow Up Recommendations Acute inpatient rehab (3hours/day) Assistance recommended at discharge -- Functional Status Assessment Patient has had a recent decline in their functional status and demonstrates the ability to make significant improvements in function in a reasonable and predictable amount of time. Frequency and Duration  07/26/2021 Speech Therapy Frequency (ACUTE ONLY) min 2x/week Treatment Duration  2 weeks   Oral Phase 07/26/2021 Oral Phase Impaired Oral - Pudding Teaspoon -- Oral - Pudding Cup -- Oral - Honey Teaspoon -- Oral - Honey Cup -- Oral - Nectar Teaspoon Holding of bolus;Delayed oral transit Oral - Nectar Cup Holding of bolus;Delayed oral transit Oral - Nectar Straw -- Oral - Thin Teaspoon NT Oral - Thin Cup Decreased velopharyngeal closure Oral - Thin Straw Holding of bolus;Delayed oral transit Oral - Puree Holding of bolus;Delayed oral transit Oral - Mech Soft Holding of bolus;Delayed oral transit;Impaired mastication Oral - Regular NT Oral - Multi-Consistency -- Oral - Pill -- Oral Phase - Comment --  Pharyngeal Phase 07/26/2021 Pharyngeal Phase Impaired Pharyngeal- Pudding Teaspoon -- Pharyngeal -- Pharyngeal- Pudding Cup -- Pharyngeal -- Pharyngeal- Honey Teaspoon -- Pharyngeal -- Pharyngeal- Honey Cup -- Pharyngeal -- Pharyngeal- Nectar Teaspoon Pharyngeal residue - valleculae;Pharyngeal residue - pyriform;Reduced epiglottic inversion;Reduced anterior laryngeal mobility;Reduced airway/laryngeal  closure;Reduced tongue base retraction Pharyngeal Material enters airway, remains ABOVE vocal cords and not ejected out;Material enters airway, CONTACTS cords and not ejected out;Material enters airway, passes BELOW cords without attempt by patient to eject out (silent aspiration) Pharyngeal- Nectar Cup Pharyngeal residue - valleculae;Pharyngeal residue - pyriform;Reduced epiglottic inversion;Reduced anterior laryngeal mobility;Reduced airway/laryngeal closure;Reduced tongue base retraction Pharyngeal Material enters airway, remains ABOVE vocal cords and not ejected out;Material enters airway, CONTACTS cords and not ejected out;Material enters airway, passes BELOW cords without attempt by patient to eject out (silent aspiration) Pharyngeal- Nectar Straw Pharyngeal residue - valleculae;Pharyngeal residue - pyriform;Reduced epiglottic inversion;Reduced anterior laryngeal mobility;Reduced airway/laryngeal  closure;Reduced tongue base retraction Pharyngeal Material enters airway, CONTACTS cords and not ejected out;Material enters airway, passes BELOW cords without attempt by patient to eject out (silent aspiration) Pharyngeal- Thin Teaspoon NT Pharyngeal -- Pharyngeal- Thin Cup Pharyngeal residue - valleculae;Pharyngeal residue - pyriform;Reduced epiglottic inversion;Reduced anterior laryngeal mobility;Reduced airway/laryngeal closure;Reduced tongue base retraction Pharyngeal Material enters airway, remains ABOVE vocal cords and not ejected out;Material enters airway, CONTACTS cords and not ejected out;Material enters airway, passes BELOW cords without attempt by patient to eject out (silent aspiration) Pharyngeal- Thin Straw Pharyngeal residue - valleculae;Pharyngeal residue - pyriform;Reduced epiglottic inversion;Reduced anterior laryngeal mobility;Reduced airway/laryngeal closure;Reduced tongue base retraction Pharyngeal Material enters airway, remains ABOVE vocal cords and not ejected out Pharyngeal- Puree Pharyngeal residue - valleculae;Reduced epiglottic inversion;Reduced anterior laryngeal mobility;Reduced airway/laryngeal closure;Reduced tongue base retraction Pharyngeal -- Pharyngeal- Mechanical Soft Pharyngeal residue - valleculae;Reduced epiglottic inversion;Reduced anterior laryngeal mobility;Reduced airway/laryngeal closure;Reduced tongue base retraction Pharyngeal -- Pharyngeal- Regular NT Pharyngeal -- Pharyngeal- Multi-consistency NT Pharyngeal -- Pharyngeal- Pill NT Pharyngeal -- Pharyngeal Comment --  Cervical Esophageal Phase  07/26/2021 Cervical Esophageal Phase WFL Pudding Teaspoon -- Pudding Cup -- Honey Teaspoon -- Honey Cup -- Nectar Teaspoon -- Nectar Cup -- Nectar Straw -- Thin Teaspoon -- Thin Cup -- Thin Straw -- Puree -- Mechanical Soft -- Regular -- Multi-consistency -- Pill -- Cervical Esophageal Comment -- Shanika I. Hardin Negus, [place], [place] Office number  [phone] Pager [phone] Horton Marshall 07/26/2021, 10:25 AM                     ECHOCARDIOGRAM LIMITED  Result Date: 07/25/2021    ECHOCARDIOGRAM LIMITED REPORT   Patient Name:   Brian [name] Date of Exam: 07/25/2021 Medical Rec #:  [id number]        Height:       70.0 in Accession #:    [id number]       Weight:       116.0 lb Date of Birth:  [birth year]       BSA:          1.656 m Patient Age:    8 years         BP:           137/87 mmHg Patient Gender: M                HR:           110 bpm. Exam Location:  Inpatient Procedure: Limited Echo Indications:    Pulmonary embolus  History:        Patient has prior history of Echocardiogram examinations, most                 recent 07/16/2021.  Sonographer:    Jefferey Pica Referring Phys: [place]  1. Left ventricular ejection fraction, by estimation, is 60  to 65%. The left ventricle has normal function.  2. The RV size and contractility appear normal. No evidence of McConnell's sign. The IVC is small and collapses with inspiration. . Right ventricular systolic function is normal. The right ventricular size is normal.  3. The aortic valve is tricuspid. Aortic valve regurgitation is not visualized. No aortic stenosis is present. FINDINGS  Left Ventricle: Left ventricular ejection fraction, by estimation, is 60 to 65%. The left ventricle has normal function. The left ventricular internal cavity size was normal in size. Right Ventricle: The RV size and contractility appear normal. No evidence of McConnell's sign. The IVC is small and collapses with inspiration. The right ventricular size is normal. Right vetricular wall thickness was not well visualized. Right ventricular systolic function is normal. Aortic Valve: The aortic valve is tricuspid. Aortic valve regurgitation is not visualized. No aortic stenosis is present. Aorta: The aortic root and ascending aorta are structurally normal, with no evidence of dilitation. LEFT  VENTRICLE PLAX 2D LVIDd:         4.30 cm LVIDs:         2.30 cm LV PW:         1.00 cm LV IVS:        0.90 cm LVOT diam:     2.10 cm LVOT Area:     3.46 cm  IVC IVC diam: 1.50 cm LEFT ATRIUM           Index        RIGHT ATRIUM           Index LA diam:      2.60 cm 1.57 cm/m   RA Area:     10.50 cm LA Vol (A2C): 46.5 ml 28.08 ml/m  RA Volume:   20.80 ml  12.56 ml/m   AORTA Ao Root diam: 3.50 cm Ao Asc diam:  3.30 cm  SHUNTS Systemic Diam: 2.10 cm Mertie Moores MD Electronically signed by Mertie Moores MD Signature Date/Time: 07/25/2021/4:31:29 PM    Final         Scheduled Meds:  apixaban  5 mg Per Tube BID   bethanechol  10 mg Per Tube TID   chlorhexidine  15 mL Mouth Rinse BID   chlorhexidine gluconate (MEDLINE KIT)  15 mL Mouth Rinse BID   Chlorhexidine Gluconate Cloth  6 each Topical Daily   clonazepam  2 mg Per Tube BID   feeding supplement (PROSource TF)  45 mL Per Tube TID   folic acid  1 mg Per Tube Daily   mouth rinse  15 mL Mouth Rinse q12n4p   metoprolol tartrate  75 mg Per Tube BID   multivitamin with minerals  1 tablet Per Tube Daily   nicotine  21 mg Transdermal Daily   QUEtiapine  25 mg Per Tube BID   thiamine  100 mg Per Tube Daily   Continuous Infusions:  sodium chloride Stopped (07/23/21 1122)   feeding supplement (VITAL 1.5 CAL) 1,000 mL (07/26/21 0346)   lactated ringers 75 mL/hr at 07/26/21 1302     LOS: 28 days    Time spent: 51 minutes spent on chart review, discussion with nursing staff, consultants, updating family and interview/physical exam; more than 50% of that time was spent in counseling and/or coordination of care.    Laine Giovanetti J [place] DO [facility] Available via Epic secure chat 7am-7pm After these hours, please refer to coverage provider listed on [email or web address] 07/27/2021, 1:44 PM

## 2021-07-28 DIAGNOSIS — G959 Disease of spinal cord, unspecified: Secondary | ICD-10-CM | POA: Diagnosis not present

## 2021-07-28 LAB — GLUCOSE, CAPILLARY
Glucose-Capillary: 105 mg/dL — ABNORMAL HIGH (ref 70–99)
Glucose-Capillary: 115 mg/dL — ABNORMAL HIGH (ref 70–99)
Glucose-Capillary: 104 mg/dL — ABNORMAL HIGH (ref 70–99)
Glucose-Capillary: 102 mg/dL — ABNORMAL HIGH (ref 70–99)
Glucose-Capillary: 146 mg/dL — ABNORMAL HIGH (ref 70–99)

## 2021-07-28 MED ORDER — CHLORHEXIDINE GLUCONATE CLOTH 2 % EX PADS
6.0000 | MEDICATED_PAD | Freq: Every day | CUTANEOUS | Status: DC
  Administered 2021-07-28 – 2021-09-20 (×53): 6 via TOPICAL

## 2021-07-28 MED ORDER — CLONAZEPAM 0.25 MG PO TBDP
1.0000 mg | ORAL_TABLET | Freq: Two times a day (BID) | ORAL | Status: DC
  Administered 2021-07-28 – 2021-08-01 (×5): 1 mg
  Filled 2021-07-28 (×6): qty 4

## 2021-07-28 MED ORDER — METOPROLOL TARTRATE 25 MG/10 ML ORAL SUSPENSION
100.0000 mg | Freq: Two times a day (BID) | ORAL | Status: DC
Start: 2021-07-28 — End: 2021-08-16
  Administered 2021-07-28 – 2021-08-16 (×38): 100 mg
  Filled 2021-07-28 (×40): qty 40

## 2021-07-28 NOTE — Progress Notes (Signed)
Pt is stable, MAE except LUE, incision ok, CCM ?

## 2021-07-28 NOTE — Progress Notes (Signed)
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  He was admitted on 2/3 and underwent ACDF of C3-4, C4-5, C5-5, and C6-7.  Post-operatively he was progressing slowly with residual weakness in both hands but improving lower extremity strength and function.  He developed some difficulty swallowing on 2/5 and SLP ordered and placed on thickened liquids.   Overnight 2/6, patient with increased difficulty swallowing and was made NPO but also noted to have dysarthria and difficulty moving extremities with numbness.  SLP evaluated and found to be moderate aspiration risk.   Also progressively tachycardic, tachypneic, and now febrile.  Code stroke activated and taken for CT/ CTA head and neck and was given decadron 51m once.  NIHSS 18.  CTH was negative for acute findings, and CTA head neck did not reveal any LVO but noted significant prevertebral soft tissue swelling with foci of gas present at the ventral epidural space at C4-5, small collection not excluded. On 2/7, patient with worsening confusion and concern for airway involvement, PCCM consulted and remained in the intensive care unit until he is transferred to the floor on 3/2 and [facility] under [facility] service.  Significant Hospital Events: 2/3 ACDF C3-4, C4-5, C5-5, and C6-7 w/ Dr. PAnnette Stable2/6 SLP eval for difficulty swallowing 2/7 Code stroke overnight, neg for LVO, CT showing soft tissue swelling.  PCCM consulted for concern of airway  management and AMS.  Went for evacuation of hematoma  2/18 CT Abd/Pelvis: showing bilateral segmental Pes, started on heparin and showing worsening pneumonia, febrile to  103.  PCT rising, 34.6, restarted on abx 2/19 possible aspiration w/ severe hypoxia; intubated; Switching from heparin to angiomax given subtherapeutic levels despite increase in rate. 2/20 Echo shows normal LV systolic function. There was notation of a + McConnell's sign c/w a large pulmonary embolus which is consistent with the finding of bilateral segmental pulmonary emboli noted on CT 07/14/2021. 2/21 rash appreciated on back; stopped cefepim/vanc switched to zosyn; required low dose levo with increase in fentanyl 2/22 remains intubated; unable to extubate to do increase RR and thick secretions 2/23 [name] infusion added 2/24: weaning fentanyl.  2/26 extubated 2/28 tachycardia persists 3/2: Modified barium swallow, continues n.p.o. with core track in place, likely will need PEG tube.     Assessment & Plan:   Assessment and Plan: * Cervical myelopathy ([place]; severe cervical spinal stenosis with cord compression s/p ACDF CU2-7on 2/3, complicated by epidural hematoma s/p evacuation 2/7 Patient initially presenting to ED after recent fall with progressive upper and lower extremity weakness and tremors, was found to have critical multilevel cervical spinal stenosis C3-3 6 with spinal cord compression and spinal cord signal change and underwent ACDF C3-4, C4-5, C5-5, and C6/7 by neurosurgery Dr. PTrenton Gammonon 06/29/2021.  Postoperative leg complicated by epidural hematoma s/p evacuation on 2/7. --Continue PT/OT/SLP efforts --Further per primary neurosurgery  Acute respiratory failure with hypoxia ([place] Etiology likely multifactorial with significant dysphagia with recurrent aspiration pneumonia events during initial hospitalization following ACDF surgery with postoperative complication of  postoperative cervical hematoma.  Patient did  require ventilatory support in the intensive care unit and was successfully extubated on 07/22/2021.  Patient completed extensive course of empiric antibiotics.   --Aspiration precautions --Continue supplemental oxygen, maintain SPO2 greater than 92%, currently on 3 L nasal cannula with SPO2 100% at rest; wean to room air    Acute pulmonary embolism (HCC) Bilateral segmental PE noted on CTA chest.  LE [place] negative for DVT.  TTE with normal LV systolic function, notation of positive McConnell sign consistent with a large pulmonary embolism which is consistent with the finding of bilateral segmental pulmonary emboli at on CT. --Eliquis 52m BID; plan to hold 48 hours prior to planned G-tube placement by IR on 3/6 --Currently on 3 L nasal cannula, continue to wean for goal SPO2 92% or greater  Acute metabolic encephalopathy CT head, MRI brain, EEG, TSH, B12, ammonia unrevealing.  Etiology likely multifactorial with acute respiratory failure secondary to aspiration pneumonia, bilateral pulmonary embolism.  Completed course of antibiotics. --Decrease Seroquel to 25 mg BID --Decrease clonazepam to 1 mg BID  Severe sepsis due to recurrent aspiration pneumonia Likely recurrent issue during hospitalization, remains n.p.o. due to continued aspiration risk per SLP.  Completed extensive course of antibiotics while under ICU care. --N.p.o., tube feeds; plan IR gtube Monday 3/6 --Aspiration precautions  Sinus tachycardia Multifactorial including sepsis, dehydration, PE, agitation.   --Metoprolol tartrate 100 mg BID --Continue to monitor on telemetry   Elevated liver enzymes Etiology likely secondary to sepsis from aspiration pneumonia as above.  Acute hepatitis panel negative.  Right upper quadrant ultrasound with no focal liver lesion, parenchymal with normal echogenicity. --Continue monitor LFTs intermittently  Hyponatremia Etiology likely secondary to dehydration.  Urine sodium low.  Improved with  IV fluids.  Sodium now normalized on tube feeds. --Continue intermittent monitoring of CMP  Tobacco use disorder Encouraged cessation. --Nicotine patch  Physical deconditioning Significant weakness in all extremities as a result of cervical myelopathy and acute illness. --Continue PT/OT efforts, will need SNF placement  Urinary retention --Bethanechol --Continue monitor urinary output  Protein-calorie malnutrition, severe ([place] As evidenced by poor p.o. intake, significant muscle mass and subcu fat loss and significant weight loss (about 16 pounds since this hospitalization). Nutrition Problem: Severe Malnutrition (in the context of social/environmental circumstances) Etiology:  (inadequate energy intake) Signs/Symptoms: mild fat depletion, severe muscle depletion, severe muscle depletion Interventions: Refer to RD note for recommendations  -- Dietitian following, appreciate assistance.  Continues tube feeds via core track tube, likely anticipate need of feeding tube per discussion with SLP; plan IR placement of G-tube on Monday  Hyperglycemia-resolved as of 07/12/2021 Likely due to steroid.  Resolved.  A1c 5.2%.     DVT prophylaxis: SCD's Start: 06/29/21 1231 apixaban (ELIQUIS) tablet 5 mg    Code Status: Full Code Family Communication: No family present at bedside this morning, updated patient's son TDarnelle Maffuccivia telephone extensively yesterday  Disposition Plan:  Level of care: Progressive Status is: Inpatient  Remains inpatient appropriate because: Remains on tube feeds via core track tube, pending G-tube placement by IR on Monday, will need SNF placement, disposition per primary neurosurgery   Procedures:  ACDF C3-7, neurosurgery Dr. PTrenton Gammon2/3 Hematoma evacuation 2/7 Vascular duplex ultrasound bilateral lower extremities 2/19 TTE 2/20 TTE 3/1  Antimicrobials:  Vancomycin 2/7 - 2/8; 2/18 - 2/21 Zosyn 2/21 -2/23 Ampicillin 2/7 -2/11 Metronidazole 2/7 - 2/8; 2/17  -2/18 Cefepime 2/18 - 2/21 Perioperative cefazolin  Subjective: Patient seen examined bedside, sleeping and somnolent.  Awakes, but remains pleasantly confused.  Pending G-tube placement by IR on Monday.  Patient denies chest pain, no shortness of breath, no abdominal pain.  No acute concerns overnight per nursing staff.  Continue to titrate down sedatives, decrease clonazepam today.  Objective: Vitals:   07/27/21 2323 07/28/21 0341 07/28/21 0741 07/28/21 1123  BP: 121/78 (!) 139/91 117/86 126/90  Pulse: 91 (!) 104 100 92  Resp: _0 Temp: 98.1 F (36.7 C) 98.5 F (36.9 C) 98.4 F (36.9 C) 98.4 F (36.9 C)  TempSrc: Oral Axillary Axillary Axillary  SpO2: 100% 100% 100% 100%  Weight:      Height:        Intake/Output Summary (Last 24 hours) at 07/28/2021 1300 Last data filed at 07/28/2021 0900 Gross per 24 hour  Intake 500 ml  Output 1650 ml  Net -1150 ml   Filed Weights   07/23/21 1330 07/25/21 0500 07/26/21 0500  Weight: 57.8 kg 52.6 kg 50.6 kg    Examination:  Physical Exam: GEN: NAD, alert, confused, cachectic/chronically ill in appearance appears older than stated age HEENT: NCAT, PERRL, EOMI, sclera clear, dry mucous membranes, core track tube noted in place PULM: CTAB w/o wheezes/crackles, normal respiratory effort, on 3 L [place], with SPO2 100% at rest CV: Tachycardic, regular rhythm w/o M/G/R GI: abd soft, NTND, NABS, no R/G/M MSK: no peripheral edema, decreased muscle strength bilateral upper/lower extremities NEURO: Awake, alert, slightly confused, decreased muscle strength bilateral upper/lower extremities PSYCH: normal mood/affect    Data Reviewed: I have personally reviewed following labs and imaging studies  CBC: Recent Labs  Lab 07/22/21 0128 07/23/21 0451 07/24/21 0231 07/27/21 0348  WBC 13.3* 12.9* 13.8* 13.8*  NEUTROABS  --   --  8.7*  --   HGB 7.9* 9.1* 9.6* 9.3*  HCT 23.5* 26.7* 29.2* 28.4*  MCV 95.9 94.0 95.1 96.6  PLT 360 459* 567*  409*   Basic Metabolic Panel: Recent Labs  Lab 07/22/21 0128 07/23/21 0451 07/24/21 0231 07/25/21 0758 07/26/21 0334 07/27/21 0348  NA 138 137 138 139   139 140 136  K 4.0 3.5 3.5 3.2*   3.2* 3.9 3.6  CL 103 97* 100 107   107 109 105  CO2 _1 21* 22  GLUCOSE 131* 114* 102* 124*   127* 110* 112*  BUN _2 22*   22* 20 15  CREATININE 0.55* 0.62 0.64 0.67   0.65 0.59* 0.51*  CALCIUM 8.8* 9.2 9.3 9.4   9.4 9.4 9.4  MG  --   --   --   --  1.9 1.7  PHOS 3.2 2.3* 2.6 3.0 3.2  --    GFR: Estimated Creatinine Clearance: 70.3 mL/min (A) (by C-G formula based on SCr of 0.51 mg/dL (L)). Liver Function Tests: Recent Labs  Lab 07/23/21 0451 07/24/21 0231 07/25/21 0758 07/26/21 0334 07/27/21 0348  AST  --  116* 109*  --  100*  ALT  --  93* 103*  --  108*  ALKPHOS  --  349* 324*  --  296*  BILITOT  --  0.5 0.6  --  0.4  PROT  --  7.9 8.2*  --  7.3  ALBUMIN 1.7* 2.0* 2.2*   2.2* 2.2* 2.2*   No results for input(s): LIPASE, AMYLASE in the last 168 hours. Recent Labs  Lab 07/24/21 1030  AMMONIA 26   Coagulation Profile: No results for input(s): INR, PROTIME in the last  168 hours. Cardiac Enzymes: No results for input(s): CKTOTAL, CKMB, CKMBINDEX, TROPONINI in the last 168 hours. BNP (last 3 results) No results for input(s): PROBNP in the last 8760 hours. HbA1C: No results for input(s): HGBA1C in the last 72 hours. CBG: Recent Labs  Lab 07/27/21 1940 07/27/21 2324 07/28/21 0410 07/28/21 0746 07/28/21 1122  GLUCAP 115* 112* 105* 115* 104*   Lipid Profile: No results for input(s): CHOL, HDL, LDLCALC, TRIG, CHOLHDL, LDLDIRECT in the last 72 hours. Thyroid Function Tests: No results for input(s): TSH, T4TOTAL, FREET4, T3FREE, THYROIDAB in the last 72 hours. Anemia Panel: No results for input(s): VITAMINB12, FOLATE, FERRITIN, TIBC, IRON, RETICCTPCT in the last 72 hours. Sepsis Labs: No results for input(s): PROCALCITON, LATICACIDVEN in the last 168  hours.  Recent Results (from the past 240 hour(s))  Culture, Respiratory w Gram Stain     Status: None   Collection Time: 07/20/21  1:57 PM   Specimen: Tracheal Aspirate; Respiratory  Result Value Ref Range Status   Specimen Description TRACHEAL ASPIRATE  Final   Special Requests NONE  Final   Gram Stain   Final    NO SQUAMOUS EPITHELIAL CELLS SEEN FEW WBC SEEN NO ORGANISMS SEEN    Culture   Final    NO GROWTH 2 DAYS Performed at [facility], [address], [place], [place] [address]    Report Status 07/22/2021 FINAL  Final         Radiology Studies: No results found.      Scheduled Meds:  bethanechol  10 mg Per Tube TID   chlorhexidine  15 mL Mouth Rinse BID   chlorhexidine gluconate (MEDLINE KIT)  15 mL Mouth Rinse BID   Chlorhexidine Gluconate Cloth  6 each Topical Daily   clonazepam  1 mg Per Tube BID   feeding supplement (PROSource TF)  45 mL Per Tube TID   folic acid  1 mg Per Tube Daily   mouth rinse  15 mL Mouth Rinse q12n4p   metoprolol tartrate  100 mg Per Tube BID   multivitamin with minerals  1 tablet Per Tube Daily   nicotine  21 mg Transdermal Daily   QUEtiapine  25 mg Per Tube BID   thiamine  100 mg Per Tube Daily   Continuous Infusions:  sodium chloride Stopped (07/23/21 1122)   feeding supplement (VITAL 1.5 CAL) 1,000 mL (07/26/21 0346)   lactated ringers 75 mL/hr at 07/28/21 0614     LOS: 29 days    Time spent: 49 minutes spent on chart review, discussion with nursing staff, consultants, updating family and interview/physical exam; more than 50% of that time was spent in counseling and/or coordination of care.    Vidur Knust J [place] DO [facility] Available via Epic secure chat 7am-7pm After these hours, please refer to coverage provider listed on [email or web address] 07/28/2021, 1:00 PM

## 2021-07-29 DIAGNOSIS — G959 Disease of spinal cord, unspecified: Secondary | ICD-10-CM | POA: Diagnosis not present

## 2021-07-29 LAB — GLUCOSE, CAPILLARY
Glucose-Capillary: 112 mg/dL — ABNORMAL HIGH (ref 70–99)
Glucose-Capillary: 116 mg/dL — ABNORMAL HIGH (ref 70–99)
Glucose-Capillary: 121 mg/dL — ABNORMAL HIGH (ref 70–99)
Glucose-Capillary: 124 mg/dL — ABNORMAL HIGH (ref 70–99)
Glucose-Capillary: 128 mg/dL — ABNORMAL HIGH (ref 70–99)

## 2021-07-29 NOTE — Progress Notes (Signed)
He appears stable, mitts in place, he seems disoriented but more awake today. Moves RUE better than LUE, but LLE better than RLE. Difficult exam because of mental status but he moves RUE and LLE at least anti-grav.  ?

## 2021-07-29 NOTE — Progress Notes (Signed)
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  He was admitted on 2/3 and underwent ACDF of C3-4, C4-5, C5-5, and C6-7.  Post-operatively he was progressing slowly with residual weakness in both hands but improving lower extremity strength and function.  He developed some difficulty swallowing on 2/5 and SLP ordered and placed on thickened liquids.   Overnight 2/6, patient with increased difficulty swallowing and was made NPO but also noted to have dysarthria and difficulty moving extremities with numbness.  SLP evaluated and found to be moderate aspiration risk.   Also progressively tachycardic, tachypneic, and now febrile.  Code stroke activated and taken for CT/ CTA head and neck and was given decadron 51m once.  NIHSS 18.  CTH was negative for acute findings, and CTA head neck did not reveal any LVO but noted significant prevertebral soft tissue swelling with foci of gas present at the ventral epidural space at C4-5, small collection not excluded. On 2/7, patient with worsening confusion and concern for airway involvement, PCCM consulted and remained in the intensive care unit until he is transferred to the floor on 3/2 and [facility] under [facility] service.  Significant Hospital Events: 2/3 ACDF C3-4, C4-5, C5-5, and C6-7 w/ Dr. PAnnette Stable2/6 SLP eval for difficulty swallowing 2/7 Code stroke overnight, neg for LVO, CT showing soft tissue swelling.  PCCM consulted for concern of airway  management and AMS.  Went for evacuation of hematoma  2/18 CT Abd/Pelvis: showing bilateral segmental Pes, started on heparin and showing worsening pneumonia, febrile to  103.  PCT rising, 34.6, restarted on abx 2/19 possible aspiration w/ severe hypoxia; intubated; Switching from heparin to angiomax given subtherapeutic levels despite increase in rate. 2/20 Echo shows normal LV systolic function. There was notation of a + McConnell's sign c/w a large pulmonary embolus which is consistent with the finding of bilateral segmental pulmonary emboli noted on CT 07/14/2021. 2/21 rash appreciated on back; stopped cefepim/vanc switched to zosyn; required low dose levo with increase in fentanyl 2/22 remains intubated; unable to extubate to do increase RR and thick secretions 2/23 [name] infusion added 2/24: weaning fentanyl.  2/26 extubated 2/28 tachycardia persists 3/2: Modified barium swallow, continues n.p.o. with core track in place, likely will need PEG tube.     Assessment & Plan:   Assessment and Plan: * Cervical myelopathy ([place]; severe cervical spinal stenosis with cord compression s/p ACDF CU2-7on 2/3, complicated by epidural hematoma s/p evacuation 2/7 Patient initially presenting to ED after recent fall with progressive upper and lower extremity weakness and tremors, was found to have critical multilevel cervical spinal stenosis C3-3 6 with spinal cord compression and spinal cord signal change and underwent ACDF C3-4, C4-5, C5-5, and C6/7 by neurosurgery Dr. PTrenton Gammonon 06/29/2021.  Postoperative leg complicated by epidural hematoma s/p evacuation on 2/7. --Continue PT/OT/SLP efforts --Further per primary neurosurgery  Acute respiratory failure with hypoxia ([place] Etiology likely multifactorial with significant dysphagia with recurrent aspiration pneumonia events during initial hospitalization following ACDF surgery with postoperative complication of  cervical hematoma.  Patient did  require ventilatory support in the intensive care unit and was successfully extubated on 07/22/2021.  Patient completed extensive course of empiric antibiotics.   --Aspiration precautions --Oxygen now weaned off, with SPO2 98% on room air    Acute pulmonary embolism (HCC) Bilateral segmental PE noted on CTA chest.  LE [place] negative for DVT.  TTE with normal LV systolic function, notation of positive McConnell sign consistent with a large pulmonary embolism which is consistent with the finding of bilateral segmental pulmonary emboli at on CT. --Eliquis 58m BID; plan to hold 48 hours prior to planned G-tube placement by IR on 3/6 --Oxygen now weaned off  Acute metabolic encephalopathy CT head, MRI brain, EEG, TSH, B12, ammonia unrevealing.  Etiology likely multifactorial with acute respiratory failure secondary to aspiration pneumonia, bilateral pulmonary embolism.  Completed course of antibiotics. --Decreased Seroquel to 25 mg BID --Decreased clonazepam to 1 mg BID  Severe sepsis due to recurrent aspiration pneumonia Likely recurrent issue during hospitalization, remains n.p.o. due to continued aspiration risk per SLP.  Completed extensive course of antibiotics while under ICU care. --N.p.o., tube feeds; plan IR gtube Monday 3/6 --Aspiration precautions  Sinus tachycardia Multifactorial including sepsis, dehydration, PE, agitation.   --Metoprolol tartrate 100 mg BID --Continue to monitor on telemetry   Elevated liver enzymes Etiology likely secondary to sepsis from aspiration pneumonia as above.  Acute hepatitis panel negative.  Right upper quadrant ultrasound with no focal liver lesion, parenchymal with normal echogenicity. --Continue monitor LFTs intermittently  Hyponatremia Etiology likely secondary to dehydration.  Urine sodium low.  Improved with IV fluids.  Sodium now normalized on tube feeds. --Continue intermittent monitoring of CMP  Tobacco use disorder Encouraged  cessation. --Nicotine patch  Physical deconditioning Significant weakness in all extremities as a result of cervical myelopathy and acute illness. --Continue PT/OT efforts, will need SNF placement  Urinary retention --Bethanechol --Continue monitor urinary output  Protein-calorie malnutrition, severe ([place] As evidenced by poor p.o. intake, significant muscle mass and subcu fat loss and significant weight loss (about 16 pounds since this hospitalization). Nutrition Problem: Severe Malnutrition (in the context of social/environmental circumstances) Etiology:  (inadequate energy intake) Signs/Symptoms: mild fat depletion, severe muscle depletion, severe muscle depletion Interventions: Refer to RD note for recommendations  -- Dietitian following, appreciate assistance.  Continues tube feeds via core track tube, likely anticipate need of feeding tube per discussion with SLP; plan IR placement of G-tube on Monday -- Hold tube feeds at midnight tonight  Hyperglycemia-resolved as of 07/12/2021 Likely due to steroid.  Resolved.  A1c 5.2%.     DVT prophylaxis: SCD's Start: 06/29/21 1231    Code Status: Full Code Family Communication: No family present at bedside this morning  Disposition Plan:  Level of care: Progressive Status is: Inpatient  Remains inpatient appropriate because: Remains on tube feeds via core track tube, pending G-tube placement by IR tomorrow, will need SNF placement, disposition per primary neurosurgery   Procedures:  ACDF C3-7, neurosurgery Dr. PTrenton Gammon2/3 Hematoma evacuation 2/7 Vascular duplex ultrasound bilateral lower extremities 2/19 TTE 2/20 TTE 3/1  Antimicrobials:  Vancomycin 2/7 - 2/8; 2/18 - 2/21 Zosyn 2/21 -2/23 Ampicillin 2/7 -2/11 Metronidazole 2/7 - 2/8; 2/17 -2/18 Cefepime 2/18 - 2/21 Perioperative cefazolin  Subjective: Patient seen examined bedside, awake and more alert.  Hand mitts in place.  Remains pleasantly confused.  No family  present at bedside this morning.  Pending IR placement of G-tube tomorrow.  No other complaints at this time.  Denies chest pain, no headache, no shortness of breath, no abdominal pain.  No acute concerns overnight per nursing staff.  Will need SNF placement.  Objective: Vitals:   07/29/21 0500 07/29/21 0521 07/29/21 0620 07/29/21 0727  BP:  115/82 115/82 (!) 144/89  Pulse:  97 (!) 109 (!) 118  Resp:  (!) 21 (!) 27 (!) 25  Temp:   98 F (36.7 C) 98.2 F (36.8 C)  TempSrc:    Oral  SpO2:  99% 92% 98%  Weight: 50 kg     Height:        Intake/Output Summary (Last 24 hours) at 07/29/2021 1215 Last data filed at 07/29/2021 0620 Gross per 24 hour  Intake 2401.06 ml  Output 3800 ml  Net -1398.94 ml   Filed Weights   07/25/21 0500 07/26/21 0500 07/29/21 0500  Weight: 52.6 kg 50.6 kg 50 kg    Examination:  Physical Exam: GEN: NAD, alert, confused, cachectic/chronically ill in appearance appears older than stated age HEENT: NCAT, PERRL, EOMI, sclera clear, dry mucous membranes, core track tube noted in place PULM: CTAB w/o wheezes/crackles, normal respiratory effort, on 3 L [place] Walk, with SPO2 100% at rest CV: Tachycardic, regular rhythm w/o M/G/R GI: abd soft, NTND, NABS, no R/G/M MSK: no peripheral edema, decreased muscle strength bilateral upper/lower extremities NEURO: Awake, alert, slightly confused, decreased muscle strength bilateral upper/lower extremities but notably increased movement of right upper extremity and left lower extremity but remains weak RLE/LUE PSYCH: Depressed mood, flat affect    Data Reviewed: I have personally reviewed following labs and imaging studies  CBC: Recent Labs  Lab 07/23/21 0451 07/24/21 0231 07/27/21 0348  WBC 12.9* 13.8* 13.8*  NEUTROABS  --  8.7*  --   HGB 9.1* 9.6* 9.3*  HCT 26.7* 29.2* 28.4*  MCV 94.0 95.1 96.6  PLT 459* 567* 336*   Basic Metabolic Panel: Recent Labs  Lab 07/23/21 0451 07/24/21 0231 07/25/21 0758 07/26/21 0334  07/27/21 0348  NA 137 138 139   139 140 136  K 3.5 3.5 3.2*   3.2* 3.9 3.6  CL 97* 100 107   107 109 105  CO2 _0 21* 22  GLUCOSE 114* 102* 124*   127* 110* 112*  BUN 15 19 22*   22* 20 15  CREATININE 0.62 0.64 0.67   0.65 0.59* 0.51*  CALCIUM 9.2 9.3 9.4   9.4 9.4 9.4  MG  --   --   --  1.9 1.7  PHOS 2.3* 2.6 3.0 3.2  --    GFR: Estimated Creatinine Clearance: 69.4 mL/min (A) (by C-G formula based on SCr of 0.51 mg/dL (L)). Liver Function Tests: Recent Labs  Lab 07/23/21 0451 07/24/21 0231 07/25/21 0758 07/26/21 0334 07/27/21 0348  AST  --  116* 109*  --  100*  ALT  --  93* 103*  --  108*  ALKPHOS  --  349* 324*  --  296*  BILITOT  --  0.5 0.6  --  0.4  PROT  --  7.9 8.2*  --  7.3  ALBUMIN 1.7* 2.0* 2.2*   2.2* 2.2* 2.2*   No results for input(s): LIPASE, AMYLASE in the last 168 hours. Recent Labs  Lab 07/24/21 1030  AMMONIA 26   Coagulation Profile: No results for input(s): INR, PROTIME in the last 168 hours. Cardiac Enzymes: No results for input(s): CKTOTAL, CKMB, CKMBINDEX, TROPONINI in the last 168 hours. BNP (last 3 results) No results for  input(s): PROBNP in the last 8760 hours. HbA1C: No results for input(s): HGBA1C in the last 72 hours. CBG: Recent Labs  Lab 07/28/21 1611 07/28/21 2109 07/29/21 0003 07/29/21 0403 07/29/21 0840  GLUCAP 102* 146* 116* 112* 121*   Lipid Profile: No results for input(s): CHOL, HDL, LDLCALC, TRIG, CHOLHDL, LDLDIRECT in the last 72 hours. Thyroid Function Tests: No results for input(s): TSH, T4TOTAL, FREET4, T3FREE, THYROIDAB in the last 72 hours. Anemia Panel: No results for input(s): VITAMINB12, FOLATE, FERRITIN, TIBC, IRON, RETICCTPCT in the last 72 hours. Sepsis Labs: No results for input(s): PROCALCITON, LATICACIDVEN in the last 168 hours.  Recent Results (from the past 240 hour(s))  Culture, Respiratory w Gram Stain     Status: None   Collection Time: 07/20/21  1:57 PM   Specimen: Tracheal Aspirate;  Respiratory  Result Value Ref Range Status   Specimen Description TRACHEAL ASPIRATE  Final   Special Requests NONE  Final   Gram Stain   Final    NO SQUAMOUS EPITHELIAL CELLS SEEN FEW WBC SEEN NO ORGANISMS SEEN    Culture   Final    NO GROWTH 2 DAYS Performed at [facility], [address], [place], [place] [address]    Report Status 07/22/2021 FINAL  Final         Radiology Studies: No results found.      Scheduled Meds:  bethanechol  10 mg Per Tube TID   chlorhexidine  15 mL Mouth Rinse BID   chlorhexidine gluconate (MEDLINE KIT)  15 mL Mouth Rinse BID   Chlorhexidine Gluconate Cloth  6 each Topical Daily   clonazepam  1 mg Per Tube BID   feeding supplement (PROSource TF)  45 mL Per Tube TID   folic acid  1 mg Per Tube Daily   mouth rinse  15 mL Mouth Rinse q12n4p   metoprolol tartrate  100 mg Per Tube BID   multivitamin with minerals  1 tablet Per Tube Daily   nicotine  21 mg Transdermal Daily   QUEtiapine  25 mg Per Tube BID   thiamine  100 mg Per Tube Daily   Continuous Infusions:  sodium chloride Stopped (07/23/21 1122)   feeding supplement (VITAL 1.5 CAL) 1,000 mL (07/29/21 0219)   lactated ringers 75 mL/hr at 07/29/21 0606     LOS: 30 days    Time spent: 49 minutes spent on chart review, discussion with nursing staff, consultants, updating family and interview/physical exam; more than 50% of that time was spent in counseling and/or coordination of care.    Marivel Mcclarty J [place] DO [facility] Available via Epic secure chat 7am-7pm After these hours, please refer to coverage provider listed on [email or web address] 07/29/2021, 12:15 PM

## 2021-07-29 NOTE — Progress Notes (Signed)
?   07/29/21 4158  ?Assess: MEWS Score  ?Temp 98 ?F (36.7 ?C)  ?BP 115/82  ?Pulse Rate (!) 109  ?ECG Heart Rate (!) 109  ?Resp (!) 27  ?Level of Consciousness Alert  ?SpO2 92 %  ?O2 Device Room Air  ?Assess: MEWS Score  ?MEWS Temp 0  ?MEWS Systolic 0  ?MEWS Pulse 1  ?MEWS RR 2  ?MEWS LOC 0  ?MEWS Score 3  ?MEWS Score Color Yellow  ?Assess: if the MEWS score is Yellow or Red  ?Were vital signs taken at a resting state? Yes  ?Focused Assessment Change from prior assessment (see assessment flowsheet)  ?Early Detection of Sepsis Score *See Row Information* Low  ?MEWS guidelines implemented *See Row Information* Yes  ?Treat  ?Pain Scale CPOT  ?Facial Expression 0  ?Body Movements 0  ?Muscle Tension 0  ?Compliance with ventilator (intubated pts.) N/A  ?Vocalization (extubated pts.) 0  ?CPOT Total 0  ?Escalate  ?MEWS: Escalate Yellow: discuss with charge nurse/RN and consider discussing with provider and RRT  ?Notify: Charge Nurse/RN  ?Name of Charge Nurse/RN Notified Delice Bison RN  ?Date Charge Nurse/RN Notified 07/29/21  ?Time Charge Nurse/RN Notified [phone]  ?Notify: Provider  ?Provider Name/Title T Oypd  ?Date Provider Notified 07/29/21  ?Time Provider Notified [phone]  ?Notification Type Page  ?Date of Provider Response 07/29/21  ?Time of Provider Response [phone]  ?Document  ?Patient Outcome Stabilized after interventions  ? ? ?

## 2021-07-30 ENCOUNTER — Inpatient Hospital Stay (HOSPITAL_COMMUNITY): Payer: 59

## 2021-07-30 DIAGNOSIS — G959 Disease of spinal cord, unspecified: Secondary | ICD-10-CM | POA: Diagnosis not present

## 2021-07-30 HISTORY — PX: IR GASTROSTOMY TUBE MOD SED: IMG625

## 2021-07-30 LAB — GLUCOSE, CAPILLARY
Glucose-Capillary: 101 mg/dL — ABNORMAL HIGH (ref 70–99)
Glucose-Capillary: 102 mg/dL — ABNORMAL HIGH (ref 70–99)
Glucose-Capillary: 102 mg/dL — ABNORMAL HIGH (ref 70–99)
Glucose-Capillary: 112 mg/dL — ABNORMAL HIGH (ref 70–99)
Glucose-Capillary: 117 mg/dL — ABNORMAL HIGH (ref 70–99)
Glucose-Capillary: 97 mg/dL (ref 70–99)

## 2021-07-30 IMAGING — XA IR PERC PLACEMENT GASTROSTOMY
2 series · 5 of 5 positions shown · non-contrast
Comparison: none

CLINICAL DATA: Bilateral pulmonary emboli, cervical myelopathy with
cord compression, needs enteral feeding support

EXAM:
PERC PLACEMENT GASTROSTOMY
FLUOROSCOPY:
Radiation Exposure Index (as provided by the fluoroscopic device):
15 mGy air Kerma
TECHNIQUE: The procedure, risks, benefits, and alternatives were explained to
the patient. Questions regarding the procedure were encouraged and
answered. The patient understands and consents to the procedure.

[Series 1: fl angio · 4 of 15 frames shown]
[frame 3/15]
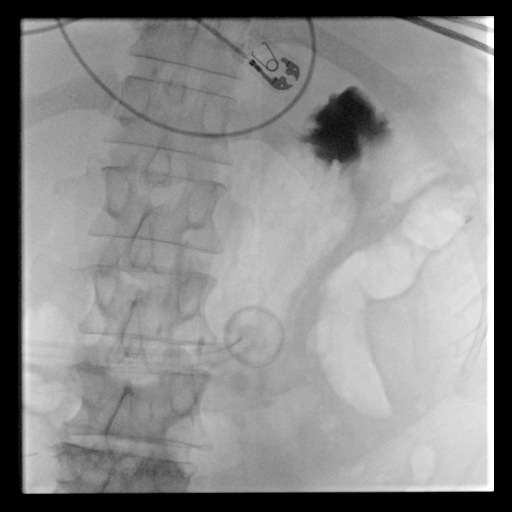
[frame 8/15]
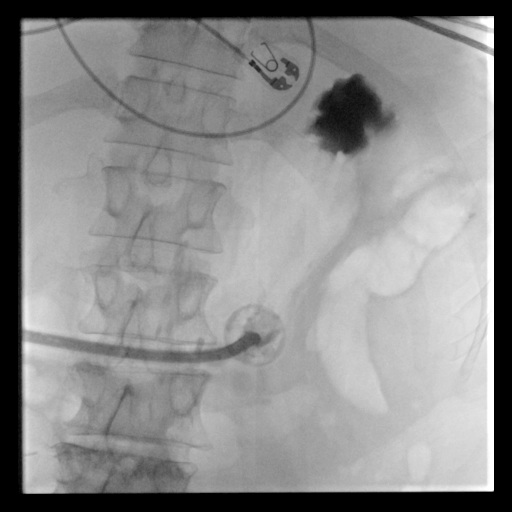
[frame 11/15]
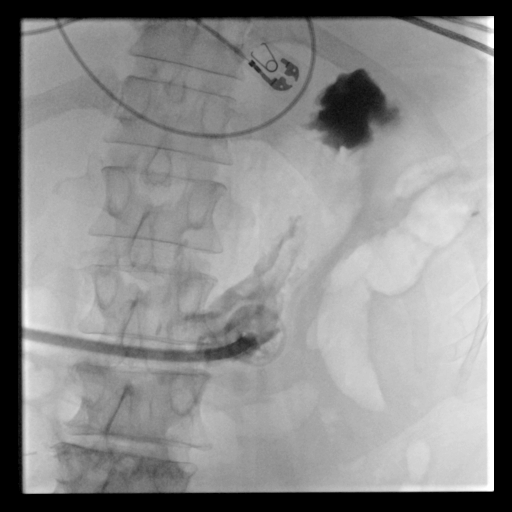
[frame 13/15]
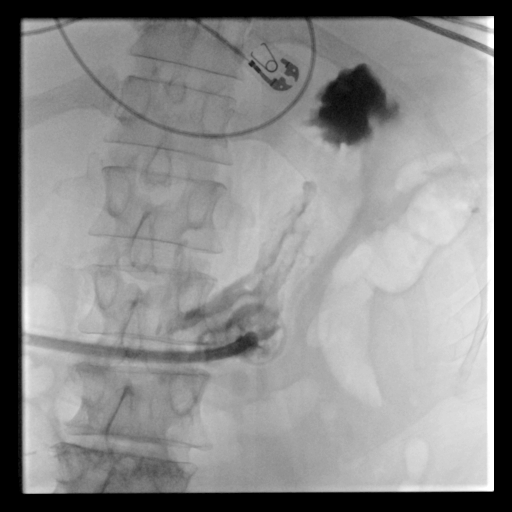

[Series 300: ir gastrostomy tube mod sed · 1 of 1 slices shown]
[im 1/1]
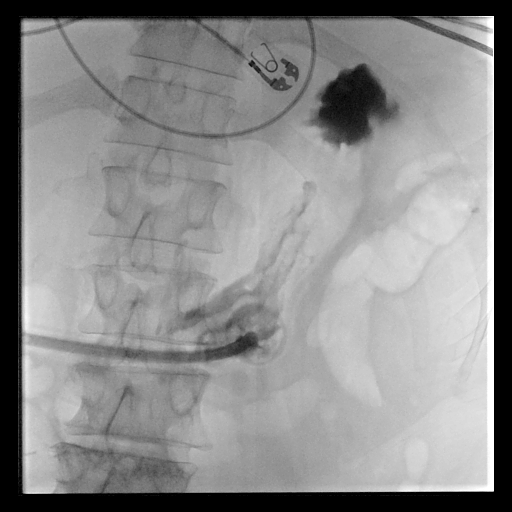

[5 of 5 positions shown; findings below may reference images not displayed]

As antibiotic prophylaxis, cefazolin 2 g was ordered pre-procedure
and administered intravenously within one hour of
incision.Progression of previously administered oral barium into the
colon was confirmed fluoroscopically. A 5 French angiographic
catheter was placed as orogastric tube. The upper abdomen was
prepped with Betadine, draped in usual sterile fashion, and
infiltrated locally with 1% lidocaine.

Intravenous Fentanyl [YD] and Versed 1mg were administered as
conscious sedation during continuous monitoring of the patient's
level of consciousness and physiological / cardiorespiratory status
by the radiology RN, with a total moderate sedation time of 10
minutes. 0.5 mg glucagon given IV to facilitate gastric distention.

Stomach was insufflated using air through the orogastric tube. An 18
French sheath needle was advanced percutaneously into the gastric
lumen under fluoroscopy. Gas could be aspirated and a small contrast
injection confirmed intraluminal spread. The sheath was exchanged
over a guidewire for a 9 French vascular sheath, through which the
snare device was advanced and used to snare a guidewire passed
through the orogastric tube. This was withdrawn, and the snare
attached to the 24 French pull-through gastrostomy tube, which was
advanced antegrade, positioned with the internal bumper securing the
anterior gastric wall to the anterior abdominal wall. Small contrast
injection confirms appropriate positioning. The external bumper was
applied and the catheter was flushed.

COMPLICATIONS:
COMPLICATIONS
none
IMPRESSION: 1. Technically successful 24 French pull-through gastrostomy
placement under fluoroscopy.

## 2021-07-30 MED ORDER — MIDAZOLAM HCL 2 MG/2ML IJ SOLN
INTRAMUSCULAR | Status: AC | PRN
  Administered 2021-07-30: 1 mg via INTRAVENOUS

## 2021-07-30 MED ORDER — LIDOCAINE HCL 1 % IJ SOLN
INTRAMUSCULAR | Status: AC
  Administered 2021-07-30: 10 mL
  Filled 2021-07-30: qty 20

## 2021-07-30 MED ORDER — FENTANYL CITRATE (PF) 100 MCG/2ML IJ SOLN
INTRAMUSCULAR | Status: AC
  Filled 2021-07-30: qty 2

## 2021-07-30 MED ORDER — FENTANYL CITRATE (PF) 100 MCG/2ML IJ SOLN
INTRAMUSCULAR | Status: AC | PRN
  Administered 2021-07-30: 25 ug via INTRAVENOUS

## 2021-07-30 MED ORDER — HYDROMORPHONE HCL 1 MG/ML IJ SOLN
1.0000 mg | INTRAMUSCULAR | Status: DC | PRN
  Administered 2021-07-30 – 2021-10-14 (×28): 1 mg via INTRAVENOUS
  Filled 2021-07-30 (×28): qty 1

## 2021-07-30 MED ORDER — HYDROCODONE-ACETAMINOPHEN 5-325 MG PO TABS: 1.0000 | ORAL_TABLET | ORAL | Status: DC | PRN

## 2021-07-30 MED ORDER — DOCUSATE SODIUM 50 MG/5ML PO LIQD
50.0000 mg | Freq: Two times a day (BID) | ORAL | Status: DC | PRN
  Administered 2021-08-05: 50 mg
  Filled 2021-07-30: qty 10

## 2021-07-30 MED ORDER — IOHEXOL 300 MG/ML  SOLN: 100.0000 mL | Freq: Once | INTRAMUSCULAR | Status: DC | PRN

## 2021-07-30 MED ORDER — SENNOSIDES 8.8 MG/5ML PO SYRP
5.0000 mL | ORAL_SOLUTION | Freq: Two times a day (BID) | ORAL | Status: DC | PRN
  Filled 2021-07-30: qty 5

## 2021-07-30 MED ORDER — CEFAZOLIN SODIUM-DEXTROSE 2-4 GM/100ML-% IV SOLN
INTRAVENOUS | Status: AC | PRN
Start: 2021-07-30 — End: 2021-07-30
  Administered 2021-07-30: 2 g via INTRAVENOUS

## 2021-07-30 MED ORDER — HYDROCODONE-ACETAMINOPHEN 7.5-325 MG/15ML PO SOLN
10.0000 mL | ORAL | Status: DC | PRN
  Administered 2021-07-30: 20 mL
  Administered 2021-08-01 – 2021-08-16 (×5): 15 mL
  Administered 2021-08-17 – 2021-08-18 (×2): 10 mL
  Administered 2021-08-25 – 2021-09-01 (×8): 15 mL
  Administered 2021-09-02: 20 mL
  Administered 2021-09-04: 15 mL
  Administered 2021-09-04 – 2021-09-06 (×4): 20 mL
  Administered 2021-09-08 – 2021-09-09 (×2): 15 mL
  Administered 2021-09-09: 20 mL
  Administered 2021-09-10 – 2021-09-14 (×3): 15 mL
  Administered 2021-09-14 – 2021-09-15 (×2): 10 mL
  Administered 2021-09-15 – 2021-09-21 (×5): 15 mL
  Filled 2021-07-30: qty 30
  Filled 2021-07-30 (×6): qty 15
  Filled 2021-07-30: qty 30
  Filled 2021-07-30: qty 15
  Filled 2021-07-30: qty 30
  Filled 2021-07-30 (×2): qty 15
  Filled 2021-07-30: qty 30
  Filled 2021-07-30 (×6): qty 15
  Filled 2021-07-30: qty 30
  Filled 2021-07-30 (×6): qty 15
  Filled 2021-07-30: qty 30
  Filled 2021-07-30 (×2): qty 15
  Filled 2021-07-30 (×2): qty 30
  Filled 2021-07-30 (×5): qty 15

## 2021-07-30 MED ORDER — MIDAZOLAM HCL 2 MG/2ML IJ SOLN
INTRAMUSCULAR | Status: AC
  Filled 2021-07-30: qty 2

## 2021-07-30 MED ORDER — ONDANSETRON HCL 4 MG/2ML IJ SOLN
4.0000 mg | INTRAMUSCULAR | Status: DC | PRN
  Administered 2021-09-13 – 2021-10-14 (×4): 4 mg via INTRAVENOUS
  Filled 2021-07-30 (×4): qty 2

## 2021-07-30 MED ORDER — ADULT MULTIVITAMIN LIQUID CH
15.0000 mL | Freq: Every day | ORAL | Status: DC
  Administered 2021-07-30 – 2021-08-13 (×15): 15 mL
  Filled 2021-07-30 (×15): qty 15

## 2021-07-30 MED ORDER — GLUCAGON HCL (RDNA) 1 MG IJ SOLR
INTRAMUSCULAR | Status: AC | PRN
Start: 2021-07-30 — End: 2021-07-30
  Administered 2021-07-30: .5 mg via INTRAVENOUS

## 2021-07-30 MED ORDER — CEFAZOLIN SODIUM-DEXTROSE 2-4 GM/100ML-% IV SOLN
INTRAVENOUS | Status: AC
Start: 2021-07-30 — End: 2021-07-30
  Filled 2021-07-30: qty 100

## 2021-07-30 MED ORDER — GLUCAGON HCL RDNA (DIAGNOSTIC) 1 MG IJ SOLR
INTRAMUSCULAR | Status: AC
  Filled 2021-07-30: qty 1

## 2021-07-30 NOTE — [place] FL2 (Signed)
?[place] MEDICAID FL2 LEVEL OF CARE SCREENING TOOL  ?  ? ?IDENTIFICATION  ?Patient Name: ?Brian [name] Birthdate: 27-May-1961 Sex: male Admission Date (Current Location): ?06/29/2021  ?[place] and [place] Number: ? [place] ?  Facility and Address:  ?[facility], [place] [address], [place], [place] [address] ?     Provider Number: ?[id number]  ?Attending Physician Name and Address:  ?Earnie Larsson, MD ? Relative Name and Phone Number:  ?Mandell,travis (Son)   [phone] [facility]) ?   ?Current Level of Care: ?Hospital Recommended Level of Care: ?[place] Prior Approval Number: ?  ? ?Date Approved/Denied: ?  PASRR Number: ?[id number] A ? ?Discharge Plan: ?SNF ?  ? ?Current Diagnoses: ?Patient Active Problem List  ? Diagnosis Date Noted  ? Urinary retention 07/26/2021  ? Abdominal distention   ? Acute respiratory failure with hypoxia ([place]) 07/15/2021  ? Malnutrition of moderate degree 07/15/2021  ? Severe sepsis due to recurrent aspiration pneumonia 07/14/2021  ? Acute pulmonary embolism ([place]) 07/14/2021  ? Sinus tachycardia 07/13/2021  ? Elevated liver enzymes 07/12/2021  ? Hyponatremia 07/12/2021  ? Acute metabolic encephalopathy 24/46/2863  ? Tobacco use disorder 07/11/2021  ? Physical deconditioning 07/11/2021  ? Protein-calorie malnutrition, severe ([place]) 07/05/2021  ? Cervical myelopathy (HCC); severe cervical spinal stenosis with cord compression s/p ACDF O1-7 on 2/3, complicated by epidural hematoma s/p evacuation 2/7 06/29/2021  ? ? ?Orientation RESPIRATION BLADDER Height & Weight   ?  ?Self ? O2 (2L ) Indwelling catheter Weight: 114 lb 10.2 oz (52 kg) ?Height:  5' 10" (177.8 cm)  ?BEHAVIORAL SYMPTOMS/MOOD NEUROLOGICAL BOWEL NUTRITION STATUS  ?    Incontinent Feeding tube (Gtube)  ?AMBULATORY STATUS COMMUNICATION OF NEEDS Skin   ?Extensive Assist Verbally Other (Comment) (Moisture associated skin damage on right buttocks; gtube site) ?  ?  ?  ?    ?     ?      ? ? ?Personal Care Assistance Level of Assistance  ?Bathing, Feeding, Dressing Bathing Assistance: Maximum assistance ?  ?Dressing Assistance: Maximum assistance ?   ? ?Functional Limitations Info  ?Sight, Hearing, Speech Sight Info: Adequate ?Hearing Info: Adequate ?Speech Info: Adequate  ? ? ?SPECIAL CARE FACTORS FREQUENCY  ?PT (By licensed PT), OT (By licensed OT)   ?  ?PT Frequency: 5x/week ?OT Frequency: 5x/week ?  ?  ?  ?   ? ? ?Contractures Contractures Info: Not present  ? ? ?Additional Factors Info  ?Code Status, Allergies Code Status Info: Full code ?Allergies Info: no known allergies ?  ?  ?  ?   ? ?Current Medications (07/30/2021):  This is the current hospital active medication list ?Current Facility-Administered Medications  ?Medication Dose Route Frequency Provider Last Rate Last Admin  ? 0.9 %  sodium chloride infusion  250 mL Intravenous Continuous Juanito Doom, MD   Stopped at 07/23/21 1122  ? acetaminophen (TYLENOL) 160 MG/5ML solution 650 mg  650 mg Per Tube Q6H PRN Simonne Maffucci B, MD   650 mg at 07/27/21 2017  ? albuterol (PROVENTIL) (2.5 MG/3ML) 0.083% nebulizer solution 2.5 mg  2.5 mg Nebulization Q2H PRN Simonne Maffucci B, MD   2.5 mg at 07/17/21 1519  ? bethanechol (URECHOLINE) tablet 10 mg  10 mg Per Tube TID Juanito Doom, MD   10 mg at 07/29/21 2159  ? bisacodyl (DULCOLAX) suppository 10 mg  10 mg Rectal Daily PRN Juanito Doom, MD   10 mg at 07/17/21 7116  ? ceFAZolin (  ANCEF) 2-4 GM/100ML-% IVPB           ? chlorhexidine (PERIDEX) 0.12 % solution 15 mL  15 mL Mouth Rinse BID Simonne Maffucci B, MD   15 mL at 07/30/21 1036  ? chlorhexidine gluconate (MEDLINE KIT) (PERIDEX) 0.12 % solution 15 mL  15 mL Mouth Rinse BID Simonne Maffucci B, MD   15 mL at 07/29/21 2202  ? Chlorhexidine Gluconate Cloth 2 % PADS 6 each  6 each Topical Daily Opyd, Ilene Qua, MD   6 each at 07/30/21 1037  ? clonazePAM (KLONOPIN) disintegrating tablet 1 mg  1 mg Per Tube BID [place] Eric J, DO    1 mg at 07/29/21 2159  ? diphenhydrAMINE (BENADRYL) capsule 25 mg  25 mg Per Tube Q6H PRN Juanito Doom, MD   25 mg at 07/18/21 1035  ? feeding supplement (PROSource TF) liquid 45 mL  45 mL Per Tube TID Juanito Doom, MD   45 mL at 07/29/21 2201  ? feeding supplement (VITAL 1.5 CAL) liquid 1,000 mL  1,000 mL Per Tube Continuous Juanito Doom, MD 50 mL/hr at 07/29/21 0219 1,000 mL at 07/29/21 0219  ? fentaNYL (SUBLIMAZE) 100 MCG/2ML injection           ? folic acid (FOLVITE) tablet 1 mg  1 mg Per Tube Daily Simonne Maffucci B, MD   1 mg at 07/29/21 1119  ? glucagon (human recombinant) (GLUCAGEN) 1 MG injection           ? haloperidol lactate (HALDOL) injection 1-4 mg  1-4 mg Intravenous Q3H PRN Juanito Doom, MD      ? HYDROcodone-acetaminophen (HYCET) 7.5-325 mg/15 ml solution 10-20 mL  10-20 mL Per Tube Q4H PRN Skeet Simmer, RPH      ? HYDROmorphone (DILAUDID) injection 1 mg  1 mg Intravenous Q2H PRN Arne Cleveland, MD      ? iohexol (OMNIPAQUE) 300 MG/ML solution 100 mL  100 mL Per Tube Once PRN Arne Cleveland, MD      ? ipratropium-albuterol (DUONEB) 0.5-2.5 (3) MG/3ML nebulizer solution 3 mL  3 mL Nebulization Q4H PRN Juanito Doom, MD   3 mL at 07/29/21 0631  ? lactated ringers infusion   Intravenous Continuous Juanito Doom, MD 75 mL/hr at 07/29/21 1855 New Bag at 07/29/21 1855  ? MEDLINE mouth rinse  15 mL Mouth Rinse q12n4p Simonne Maffucci B, MD   15 mL at 07/29/21 1544  ? metoprolol tartrate (LOPRESSOR) 25 mg/10 mL oral suspension 100 mg  100 mg Per Tube BID [place] Eric J, DO   100 mg at 07/29/21 2202  ? metoprolol tartrate (LOPRESSOR) injection 5 mg  5 mg Intravenous Q6H PRN Juanito Doom, MD   5 mg at 07/25/21 1740  ? midazolam (VERSED) 2 MG/2ML injection           ? multivitamin liquid 15 mL  15 mL Per Tube Daily Skeet Simmer, RPH      ? nicotine (NICODERM CQ - dosed in mg/24 hours) patch 21 mg  21 mg Transdermal Daily Simonne Maffucci B, MD   21 mg at  07/29/21 1000  ? ondansetron (ZOFRAN) injection 4 mg  4 mg Intravenous Q6H PRN Juanito Doom, MD   4 mg at 07/24/21 0930  ? ondansetron (ZOFRAN) injection 4 mg  4 mg Intravenous Q4H PRN Arne Cleveland, MD      ? QUEtiapine (SEROQUEL) tablet 25 mg  25 mg Per Tube BID  [place] Eric J, DO   25 mg at 07/29/21 2159  ? senna-docusate (Senokot-S) tablet 1 tablet  1 tablet Per Tube BID PRN Juanito Doom, MD      ? sodium phosphate (FLEET) 7-19 GM/118ML enema 1 enema  1 enema Rectal Daily PRN Juanito Doom, MD   1 enema at 07/17/21 2340  ? thiamine tablet 100 mg  100 mg Per Tube Daily Simonne Maffucci B, MD   100 mg at 07/29/21 1119  ? white petrolatum (VASELINE) gel   Topical PRN Juanito Doom, MD   Given at 07/21/21 1947  ? ? ? ?Discharge Medications: ?Please see discharge summary for a list of discharge medications. ? ?Relevant Imaging Results: ? ?Relevant Lab Results: ? ? ?Additional Information ?SSN [phone] medicaid pending; possible LOG ? ?[place], LCSW ? ? ? ? ?

## 2021-07-30 NOTE — Progress Notes (Signed)
Nutrition Follow-up ? ?DOCUMENTATION CODES:  ?Severe malnutrition in context of social or environmental circumstances ? ?INTERVENTION:  ?Continue TF via g-tube once ready for use: ?Vital 1.5 at 50 ml/hr ?ProSource TF 45 mL TID ?This provides 1920 kcals, 114 g of protein and 912 mL of free water ? ?NUTRITION DIAGNOSIS:  ?Severe Malnutrition related to social / environmental circumstances as evidenced by severe muscle depletion, severe fat depletion. -- ongoing  ? ?GOAL:  ?Patient will meet greater than or equal to 90% of their needs -- met with TF at goal ? ?MONITOR:  ?TF tolerance ? ?REASON FOR ASSESSMENT:  ?Consult ?Assessment of nutrition requirement/status, Enteral/tube feeding initiation and management ? ?ASSESSMENT:  ?Pt with hx EtOH abuse, tobacco use, and spinal stenosis initially presented 2/3 for planned multilevel anterior cervical decompression and fusion surgery after experiencing progressive bilateral upper and lower extremity weakness and spasticity due to critical multilevel cervical spinal stenosis with spinal cord compression and signal change. ? ?02/03 - s/p anterior cervical discectomy with interbody fusion  ?02/05 - pt initially complained of worsening swallowing function ?02/06 - MBS, NPO per SLP ?02/07 - Code Stroke activated (negative) but transferred to ICU, s/p re-exploration anterior cervical fusion with evacuation of epidural hematoma ?02/08 - Cortrak tube placed (tip gastric), tube feeds initiated ?02/10 - MBS, diet advanced to dysphagia 2 with nectar-thick liquids, Cortrak removed ?02/11 - NPO ?02/13 - diet advanced to dysphagia 2 with nectar-thick liquids ?02/16 - diet advanced to dysphagia 2 with thin liquids ?02/19- Intubated after desaturation, possibly from PO intake ?02/20 - s/p cortrak tube; post pyloric  ?02/22 - TF off, golytely given ?02/23 - restarted and advancing TF ?02/26 - Extubated ?03/02 - failed MBS ? ?Pt out of room at time of RD visit. Per RN, pt having G-tube placed.  Pt had previouslly been tolerating Vital 1.5 @ 50 ml/hr w/ 72m Prosource TF TID via Cortrak. Recommend continue TF once G-tube ready for use.  ? ?UOP: 12072mx24 hours ?I/O: -10.2m65mince admit ? ?Admit wt 61.7 kg ?Current wt 52 kg ? ?Labs reviewed ?CBGs 101-128 x 24 hours ?Meds: MVI, thiamine, folvite, glucagon x1, IV abx, LR @ 36m8m ? ?Diet Order:   ?Diet Order   ? ?       ?  Diet NPO time specified Except for: Ice Chips  Diet effective now       ?  ? ?  ?  ? ?  ? ?EDUCATION NEEDS:  ?Education needs have been addressed ? ?Skin:  Skin Assessment: Skin Integrity Issues: ?Skin Integrity Issues:: Other (Comment) ?Incisions: closed neck ?Other: MASD buttocks ? ?Last BM:  3/6 rectal tube ? ?Height:  ?Ht Readings from Last 1 Encounters:  ?07/15/21 5' 10"  (1.778 m)  ? ?Weight:  ?Wt Readings from Last 1 Encounters:  ?07/30/21 52 kg  ? ?Ideal Body Weight:  78.2 kg ? ?BMI:  Body mass index is 16.45 kg/m?. ? ?Estimated Nutritional Needs:  ?Kcal:  1800-2000 ?Protein:  100-115 grams ?Fluid:  >1.8 L/day ? ? ? ?AmanTheone StanleyS, RD, LDN (she/her/hers) ?RD pager number and weekend/on-call pager number located in [place]? ?

## 2021-07-30 NOTE — TOC Progression Note (Signed)
Transition of Care (TOC) - Progression Note  ? ? ?Patient Details  ?Name: Brian [name] ?MRN: [id number] ?Date of Birth: 06-17-1960 ? ?Transition of Care (TOC) CM/SW Contact  ?[place], LCSW ?Phone Number: ?07/30/2021, 3:02 PM ? ?Clinical Narrative:    ? ?Pt received offer in hub from [facility]. CSW contacted [facility] liaison to clarify if they can offer with medicaid pending and possible LOG; [facility] cannot offer bed with medicaid pending. Pt has no other bed offers at this time.  ? ?  ?Barriers to Discharge: SNF Pending bed offer, SNF Pending payor source - LOG, SNF Pending Medicaid, Inadequate or no insurance ? ?Expected Discharge Plan and Services ?  ?In-house Referral: Clinical Social Work ?  ?  ?  ?                ?  ?  ?  ?  ?  ?  ?  ?  ?  ?  ? ? ?Social Determinants of Health (SDOH) Interventions ?  ? ?Readmission Risk Interventions ?No flowsheet data found. ? ?

## 2021-07-30 NOTE — Progress Notes (Signed)
?   07/30/21 1025  ?Assess: MEWS Score  ?Temp 98.3 ?F (36.8 ?C)  ?BP 114/82  ?Pulse Rate (!) 110  ?ECG Heart Rate (!) 113  ?Resp 20  ?Level of Consciousness Alert  ?SpO2 96 %  ?O2 Device Room Air  ?Assess: MEWS Score  ?MEWS Temp 0  ?MEWS Systolic 0  ?MEWS Pulse 2  ?MEWS RR 0  ?MEWS LOC 0  ?MEWS Score 2  ?MEWS Score Color Yellow  ?Assess: if the MEWS score is Yellow or Red  ?Were vital signs taken at a resting state? Yes  ?Focused Assessment No change from prior assessment  ?Early Detection of Sepsis Score *See Row Information* Low  ?MEWS guidelines implemented *See Row Information* Yes  ?Treat  ?Pain Scale 0-10  ?Pain Score 0  ?Notify: Charge Nurse/RN  ?Name of Charge Nurse/RN Notified Marylene Land  ?Date Charge Nurse/RN Notified 07/30/21  ?Time Charge Nurse/RN Notified 1025  ?Notify: Provider  ?Provider Name/Title Revonda Standard, NP  ?Date Provider Notified 07/30/21  ?Time Provider Notified 1025  ?Notification Type Page  ?Notification Reason  ?(yellow MEWS again)  ?Provider response No new orders  ?Date of Provider Response 07/30/21  ?Time of Provider Response 1025  ?Document  ?Patient Outcome Other (Comment) ?(patient stable)  ?Progress note created (see row info) Yes  ? ? ?

## 2021-07-30 NOTE — Progress Notes (Signed)
Dr. [place] made aware of patient's tachycardia and agitation. Will continue to monitor.  ?

## 2021-07-30 NOTE — Plan of Care (Signed)
?  Problem: Clinical Measurements: ?Goal: Diagnostic test results will improve ?Outcome: Progressing ?  ?Problem: Activity: ?Goal: Risk for activity intolerance will decrease ?Outcome: Progressing ?  ?Problem: Activity: ?Goal: Ability to tolerate increased activity will improve ?Outcome: Completed/Met ?  ?Problem: Respiratory: ?Goal: Ability to maintain a clear airway and adequate ventilation will improve ?Outcome: Completed/Met ?  ?Problem: Role Relationship: ?Goal: Method of communication will improve ?Outcome: Completed/Met ?  ?Problem: Activity: ?Goal: Ability to tolerate increased activity will improve ?Outcome: Completed/Met ?  ?Problem: Respiratory: ?Goal: Ability to maintain a clear airway and adequate ventilation will improve ?Outcome: Completed/Met ?  ?Problem: Role Relationship: ?Goal: Method of communication will improve ?Outcome: Completed/Met ?  ?

## 2021-07-30 NOTE — Procedures (Signed)
?  Procedure: Gastrostomy tube placement under fluoro 56f ?EBL:   minimal ?Complications:  none immediate ? ?See full dictation in [facility]. ? ?D. Oley Balm MD ?Main # [phone] ?Pager  [phone] ?Mobile [phone] ?  ? ?

## 2021-07-30 NOTE — Progress Notes (Signed)
PT Cancellation Note ? ?Patient Details ?Name: Brian [name] ?MRN: [id number] ?DOB: 04-13-1961 ? ? ?Cancelled Treatment:    Reason Eval/Treat Not Completed: Patient at procedure or test/unavailable ? ?Patient off the floor for PEG, per RN.  ? ? ?Arby Barrette, PT ?Acute Rehabilitation Services  ?Pager [phone] ?Office [phone] ? ? ?Jeanie Cooks Stevee Valenta ?07/30/2021, 9:06 AM ?

## 2021-07-30 NOTE — Progress Notes (Signed)
PROGRESS NOTE    WOLF BOULAY  VPX:[id number] DOB: 25-Aug-1960 DOA: 06/29/2021 PCP: Merryl Hacker, No    Brief Narrative:  Brian [name] is a 61 year old male with past medical history significant for tobacco and EtOH use disorder who was admitted by neurosurgery for cervical myelopathy due to critical multilevel cervical spinal canal stenosis C3-6 with spinal cord compression and spinal cord signal change after recent fall with progressive upper and lower extremity weakness and tremors.  He was admitted on 2/3 and underwent ACDF of C3-4, C4-5, C5-5, and C6-7.  Post-operatively he was progressing slowly with residual weakness in both hands but improving lower extremity strength and function.  He developed some difficulty swallowing on 2/5 and SLP ordered and placed on thickened liquids.   Overnight 2/6, patient with increased difficulty swallowing and was made NPO but also noted to have dysarthria and difficulty moving extremities with numbness.  SLP evaluated and found to be moderate aspiration risk.   Also progressively tachycardic, tachypneic, and now febrile.  Code stroke activated and taken for CT/ CTA head and neck and was given decadron 40m once.  NIHSS 18.  CTH was negative for acute findings, and CTA head neck did not reveal any LVO but noted significant prevertebral soft tissue swelling with foci of gas present at the ventral epidural space at C4-5, small collection not excluded. On 2/7, patient with worsening confusion and concern for airway involvement, PCCM consulted and remained in the intensive care unit until he is transferred to the floor on 3/2 and [facility] under [facility] service.  Significant Hospital Events: 2/3 ACDF C3-4, C4-5, C5-5, and C6-7 w/ Dr. PAnnette Stable2/6 SLP eval for difficulty swallowing 2/7 Code stroke overnight, neg for LVO, CT showing soft tissue swelling.  PCCM consulted for concern of airway  management and AMS.  Went for evacuation of hematoma  2/18 CT Abd/Pelvis: showing bilateral segmental Pes, started on heparin and showing worsening pneumonia, febrile to  103.  PCT rising, 34.6, restarted on abx 2/19 possible aspiration w/ severe hypoxia; intubated; Switching from heparin to angiomax given subtherapeutic levels despite increase in rate. 2/20 Echo shows normal LV systolic function. There was notation of a + McConnell's sign c/w a large pulmonary embolus which is consistent with the finding of bilateral segmental pulmonary emboli noted on CT 07/14/2021. 2/21 rash appreciated on back; stopped cefepim/vanc switched to zosyn; required low dose levo with increase in fentanyl 2/22 remains intubated; unable to extubate to do increase RR and thick secretions 2/23 [name] infusion added 2/24: weaning fentanyl.  2/26 extubated 2/28 tachycardia persists 3/2: Modified barium swallow, continues n.p.o. with core track in place, likely will need PEG tube. 3/6: s/p IR G-tube placement     Assessment & Plan:   Assessment and Plan: * Cervical myelopathy ([place]; severe cervical spinal stenosis with cord compression s/p ACDF CI6-2on 2/3, complicated by epidural hematoma s/p evacuation 2/7 Patient initially presenting to ED after recent fall with progressive upper and lower extremity weakness and tremors, was found to have critical multilevel cervical spinal stenosis C3-3 6 with spinal cord compression and spinal cord signal change and underwent ACDF C3-4, C4-5, C5-5, and C6/7 by neurosurgery Dr. PTrenton Gammonon 06/29/2021.  Postoperative leg complicated by epidural hematoma s/p evacuation on 2/7. --Continue PT/OT/SLP efforts --Further per primary neurosurgery  Acute respiratory failure with hypoxia ([place] Etiology likely multifactorial with significant dysphagia with recurrent aspiration pneumonia events during initial hospitalization following ACDF surgery  with postoperative complication of postoperative  cervical hematoma.  Patient did require ventilatory support in the intensive care unit and was successfully extubated on 07/22/2021.  Patient completed extensive course of empiric antibiotics.   --Aspiration precautions --Oxygen now weaned off, with SPO2 98% on room air    Acute pulmonary embolism (HCC) Bilateral segmental PE noted on CTA chest.  LE [place] negative for DVT.  TTE with normal LV systolic function, notation of positive McConnell sign consistent with a large pulmonary embolism which is consistent with the finding of bilateral segmental pulmonary emboli at on CT. --Eliquis 81m BID --Oxygen now weaned off  Acute metabolic encephalopathy CT head, MRI brain, EEG, TSH, B12, ammonia unrevealing.  Etiology likely multifactorial with acute respiratory failure secondary to aspiration pneumonia, bilateral pulmonary embolism.  Completed course of antibiotics. --Decreased Seroquel to 25 mg BID --Decreased clonazepam to 1 mg BID  Severe sepsis due to recurrent aspiration pneumonia Likely recurrent issue during hospitalization, remains n.p.o. due to continued aspiration risk per SLP.  Completed extensive course of antibiotics while under ICU care. --N.p.o., tube feeds; s/p IR gtube 3/6 --Aspiration precautions  Sinus tachycardia Multifactorial including sepsis, dehydration, PE, agitation.   --Metoprolol tartrate 100 mg BID --Continue to monitor on telemetry   Elevated liver enzymes Etiology likely secondary to sepsis from aspiration pneumonia as above.  Acute hepatitis panel negative.  Right upper quadrant ultrasound with no focal liver lesion, parenchymal with normal echogenicity. --Continue monitor LFTs intermittently  Hyponatremia Etiology likely secondary to dehydration.  Urine sodium low.  Improved with IV fluids.  Sodium now normalized on tube feeds. --Continue intermittent monitoring of CMP  Tobacco use disorder Encouraged cessation. --Nicotine patch  Physical  deconditioning Significant weakness in all extremities as a result of cervical myelopathy and acute illness. --Continue PT/OT efforts, will need SNF placement  Urinary retention --Bethanechol --Continue monitor urinary output  Protein-calorie malnutrition, severe ([place] As evidenced by poor p.o. intake, significant muscle mass and subcu fat loss and significant weight loss (about 16 pounds since this hospitalization). Nutrition Problem: Severe Malnutrition (in the context of social/environmental circumstances) Etiology:  (inadequate energy intake) Signs/Symptoms: mild fat depletion, severe muscle depletion, severe muscle depletion Interventions: Refer to RD note for recommendations  -- Dietitian following, appreciate assistance.   --s/p IR gtube 3/6.  --Continue tube feeds --continue SLP efforts  Hyperglycemia-resolved as of 07/12/2021 Likely due to steroid.  Resolved.  A1c 5.2%.     DVT prophylaxis: SCD's Start: 06/29/21 1231    Code Status: Full Code Family Communication: No family present at bedside this morning  Disposition Plan:  Level of care: Progressive Status is: Inpatient  Remains inpatient appropriate because: Status post G-tube placement by IR today, will need SNF placement   Procedures:  ACDF C3-7, neurosurgery Dr. PTrenton Gammon2/3 Hematoma evacuation 2/7 Vascular duplex ultrasound bilateral lower extremities 2/19 TTE 2/20 TTE 3/1  Antimicrobials:  Vancomycin 2/7 - 2/8; 2/18 - 2/21 Zosyn 2/21 -2/23 Ampicillin 2/7 -2/11 Metronidazole 2/7 - 2/8; 2/17 -2/18 Cefepime 2/18 - 2/21 Perioperative cefazolin  Subjective: Patient seen examined bedside, just returned from G-tube placement by IR.  Patient states he feels better with the tube out of his nose.  Continues with global weakness of bilateral upper/lower extremities. Hand mitts in place.  No other complaints at this time.  Denies chest pain, no headache, no shortness of breath, no abdominal pain.  No acute  concerns overnight per nursing staff.  Will need SNF placement.  Objective: Vitals:   07/30/21 0950 07/30/21 1005  07/30/21 1025 07/30/21 1208  BP: (!) 145/99 129/90 114/82 111/82  Pulse: (!) 116 (!) 115 (!) 110 94  Resp: 14 (!) 24 20 (!) 23  Temp:   98.3 F (36.8 C) 98.1 F (36.7 C)  TempSrc:   Axillary Axillary  SpO2: 99% 94% 96% 97%  Weight:      Height:        Intake/Output Summary (Last 24 hours) at 07/30/2021 1352 Last data filed at 07/30/2021 1330 Gross per 24 hour  Intake 2154.12 ml  Output 2500 ml  Net -345.88 ml   Filed Weights   07/26/21 0500 07/29/21 0500 07/30/21 0423  Weight: 50.6 kg 50 kg 52 kg    Examination:  Physical Exam: GEN: NAD, alert, confused, cachectic/chronically ill in appearance appears older than stated age HEENT: NCAT, PERRL, EOMI, sclera clear, dry mucous membranes, core track tube noted in place PULM: CTAB w/o wheezes/crackles, normal respiratory effort, on 3 L [place], with SPO2 100% at rest CV: Tachycardic, regular rhythm w/o M/G/R GI: abd soft, NTND, NABS, no R/G/M MSK: no peripheral edema, decreased muscle strength bilateral upper/lower extremities NEURO: Awake, alert, slightly confused, decreased muscle strength bilateral upper/lower extremities but notably increased movement of right upper extremity and left lower extremity but remains weak RLE/LUE PSYCH: Depressed mood, flat affect    Data Reviewed: I have personally reviewed following labs and imaging studies  CBC: Recent Labs  Lab 07/24/21 0231 07/27/21 0348  WBC 13.8* 13.8*  NEUTROABS 8.7*  --   HGB 9.6* 9.3*  HCT 29.2* 28.4*  MCV 95.1 96.6  PLT 567* 474*   Basic Metabolic Panel: Recent Labs  Lab 07/24/21 0231 07/25/21 0758 07/26/21 0334 07/27/21 0348  NA 138 139   139 140 136  K 3.5 3.2*   3.2* 3.9 3.6  CL 100 107   107 109 105  CO2 29 23   22  21* 22  GLUCOSE 102* 124*   127* 110* 112*  BUN 19 22*   22* 20 15  CREATININE 0.64 0.67   0.65 0.59* 0.51*  CALCIUM 9.3  9.4   9.4 9.4 9.4  MG  --   --  1.9 1.7  PHOS 2.6 3.0 3.2  --    GFR: Estimated Creatinine Clearance: 72.2 mL/min (A) (by C-G formula based on SCr of 0.51 mg/dL (L)). Liver Function Tests: Recent Labs  Lab 07/24/21 0231 07/25/21 0758 07/26/21 0334 07/27/21 0348  AST 116* 109*  --  100*  ALT 93* 103*  --  108*  ALKPHOS 349* 324*  --  296*  BILITOT 0.5 0.6  --  0.4  PROT 7.9 8.2*  --  7.3  ALBUMIN 2.0* 2.2*   2.2* 2.2* 2.2*   No results for input(s): LIPASE, AMYLASE in the last 168 hours. Recent Labs  Lab 07/24/21 1030  AMMONIA 26   Coagulation Profile: No results for input(s): INR, PROTIME in the last 168 hours. Cardiac Enzymes: No results for input(s): CKTOTAL, CKMB, CKMBINDEX, TROPONINI in the last 168 hours. BNP (last 3 results) No results for input(s): PROBNP in the last 8760 hours. HbA1C: No results for input(s): HGBA1C in the last 72 hours. CBG: Recent Labs  Lab 07/29/21 2058 07/30/21 0007 07/30/21 0443 07/30/21 0759 07/30/21 1206  GLUCAP 128* 112* 102* 101* 117*   Lipid Profile: No results for input(s): CHOL, HDL, LDLCALC, TRIG, CHOLHDL, LDLDIRECT in the last 72 hours. Thyroid Function Tests: No results for input(s): TSH, T4TOTAL, FREET4, T3FREE, THYROIDAB in the last 72 hours. Anemia  Panel: No results for input(s): VITAMINB12, FOLATE, FERRITIN, TIBC, IRON, RETICCTPCT in the last 72 hours. Sepsis Labs: No results for input(s): PROCALCITON, LATICACIDVEN in the last 168 hours.  Recent Results (from the past 240 hour(s))  Culture, Respiratory w Gram Stain     Status: None   Collection Time: 07/20/21  1:57 PM   Specimen: Tracheal Aspirate; Respiratory  Result Value Ref Range Status   Specimen Description TRACHEAL ASPIRATE  Final   Special Requests NONE  Final   Gram Stain   Final    NO SQUAMOUS EPITHELIAL CELLS SEEN FEW WBC SEEN NO ORGANISMS SEEN    Culture   Final    NO GROWTH 2 DAYS Performed at [facility], [address],  [place], [place] [address]    Report Status 07/22/2021 FINAL  Final         Radiology Studies: No results found.      Scheduled Meds:  bethanechol  10 mg Per Tube TID   chlorhexidine  15 mL Mouth Rinse BID   chlorhexidine gluconate (MEDLINE KIT)  15 mL Mouth Rinse BID   Chlorhexidine Gluconate Cloth  6 each Topical Daily   clonazepam  1 mg Per Tube BID   feeding supplement (PROSource TF)  45 mL Per Tube TID   fentaNYL       folic acid  1 mg Per Tube Daily   glucagon (human recombinant)       mouth rinse  15 mL Mouth Rinse q12n4p   metoprolol tartrate  100 mg Per Tube BID   midazolam       multivitamin  15 mL Per Tube Daily   nicotine  21 mg Transdermal Daily   QUEtiapine  25 mg Per Tube BID   thiamine  100 mg Per Tube Daily   Continuous Infusions:  sodium chloride Stopped (07/23/21 1122)   ceFAZolin     feeding supplement (VITAL 1.5 CAL) 1,000 mL (07/29/21 0219)   lactated ringers 75 mL/hr at 07/29/21 1855     LOS: 31 days    Time spent: 49 minutes spent on chart review, discussion with nursing staff, consultants, updating family and interview/physical exam; more than 50% of that time was spent in counseling and/or coordination of care.    Donna Snooks J [place] DO [facility] Available via Epic secure chat 7am-7pm After these hours, please refer to coverage provider listed on [email or web address] 07/30/2021, 1:52 PM

## 2021-07-30 NOTE — Progress Notes (Signed)
? ?  Providing Compassionate, Quality Care - Together ? ? ?Subjective: ?Nurse reports patient was just given pain medication. PEG placed this morning. ? ?Objective: ?Vital signs in last 24 hours: ?Temp:  [98.1 ?F (36.7 ?C)-98.6 ?F (37 ?C)] 98.3 ?F (36.8 ?C) (03/06 1430) ?Pulse Rate:  [92-116] 92 (03/06 1430) ?Resp:  [14-24] 23 (03/06 1430) ?BP: (105-154)/(72-99) 131/98 (03/06 1430) ?SpO2:  [94 %-100 %] 97 % (03/06 1430) ?Weight:  [52 kg] 52 kg (03/06 0423) ? ?Intake/Output from previous day: ?03/05 0701 - 03/06 0700 ?In: 1901.7 [I.V.:1351.7; NG/GT:550] ?Out: 1600 [Urine:900; Stool:700] ?Intake/Output this shift: ?Total I/O ?In: 252.5 [I.V.:252.5] ?Out: 900 [Urine:600; Stool:300] ? ?Drowsy, responds to voice ?PERRLA ?MAE, LLE >RLE ?Decreased fine motor BUE ?Incision is healing well ? ?Lab Results: ?No results for input(s): WBC, HGB, HCT, PLT in the last 72 hours. ?BMET ?No results for input(s): NA, K, CL, CO2, GLUCOSE, BUN, CREATININE, CALCIUM in the last 72 hours. ? ?Studies/Results: ?No results found. ? ?Assessment/Plan: ?Patient status post C3-4, C4-5, C5-6, C6-7 anterior cervical discectomy with interbody fusion by Dr. Annette Stable on 06/29/2021. Increased difficulty swallowing with lung atelectasis and elevated temperatures on 07/02/2021. Made NPO following MBS by SLP. 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 CTA head and neck negative for stroke. MRI revealed epidural hematoma. Patient underwent exploration of his cervical fusion with evacuation of the epidural hematoma on 07/03/2021. Cortrak was placed on 07/04/2021. Patient's strength much improved since epidural hematoma evacuation. Cortrak removed 07/06/2021 and dysphagia 2 diet with nectar thick liquids started. Patient with delirium vs ETOH withdrawal. Discontinued steroids 07/06/2021. CIWA protocol started and discontinued 07/07/2021. CT and MRI 07/07/2021 negative. Patient developed respiratory distress, requiring  intubation on 07/15/2021. Work up revealed aspiration PNA and PE. Patient was extubated on 07/22/2021. Foley catheter removed 07/24/2021. Patient transferred to 3W on 07/26/2021. Foley replaced over the weekend. ? ? LOS: 31 days  ? ?-Spoke with patient's son and updated him on patient's situation. ?-Patient's son is working on a Medicaid application so patient can be placed in a SNF for further rehabilitation. ? ? ?Viona Gilmore, DNP, AGNP-C ?Nurse Practitioner ? ?[facility] ?[address], [place], [place] [address] ?[id number]    FXU:[id number] ? ?07/30/2021, 3:44 PM ? ? ? ? ?

## 2021-07-31 DIAGNOSIS — G959 Disease of spinal cord, unspecified: Secondary | ICD-10-CM | POA: Diagnosis not present

## 2021-07-31 LAB — COMPREHENSIVE METABOLIC PANEL
ALT: 60 U/L — ABNORMAL HIGH (ref 0–44)
AST: 46 U/L — ABNORMAL HIGH (ref 15–41)
Albumin: 2.5 g/dL — ABNORMAL LOW (ref 3.5–5.0)
Alkaline Phosphatase: 237 U/L — ABNORMAL HIGH (ref 38–126)
Anion gap: 10 (ref 5–15)
BUN: 13 mg/dL (ref 6–20)
CO2: 22 mmol/L (ref 22–32)
Calcium: 9.5 mg/dL (ref 8.9–10.3)
Chloride: 97 mmol/L — ABNORMAL LOW (ref 98–111)
Creatinine, Ser: 0.59 mg/dL — ABNORMAL LOW (ref 0.61–1.24)
GFR, Estimated: >60 mL/min (ref >60)
Glucose, Bld: 92 mg/dL (ref 70–99)
Potassium: 4.2 mmol/L (ref 3.5–5.1)
Sodium: 129 mmol/L — ABNORMAL LOW (ref 135–145)
Total Bilirubin: 0.8 mg/dL (ref 0.3–1.2)
Total Protein: 8.1 g/dL (ref 6.5–8.1)

## 2021-07-31 LAB — GLUCOSE, CAPILLARY
Glucose-Capillary: 100 mg/dL — ABNORMAL HIGH (ref 70–99)
Glucose-Capillary: 102 mg/dL — ABNORMAL HIGH (ref 70–99)
Glucose-Capillary: 103 mg/dL — ABNORMAL HIGH (ref 70–99)
Glucose-Capillary: 104 mg/dL — ABNORMAL HIGH (ref 70–99)
Glucose-Capillary: 108 mg/dL — ABNORMAL HIGH (ref 70–99)
Glucose-Capillary: 95 mg/dL (ref 70–99)
Glucose-Capillary: 97 mg/dL (ref 70–99)
Glucose-Capillary: 99 mg/dL (ref 70–99)

## 2021-07-31 LAB — CBC
HCT: 30.6 % — ABNORMAL LOW (ref 39.0–52.0)
Hemoglobin: 10.1 g/dL — ABNORMAL LOW (ref 13.0–17.0)
MCH: 31.1 pg (ref 26.0–34.0)
MCHC: 33 g/dL (ref 30.0–36.0)
MCV: 94.2 fL (ref 80.0–100.0)
Platelets: 811 10*3/uL — ABNORMAL HIGH (ref 150–400)
RBC: 3.25 MIL/uL — ABNORMAL LOW (ref 4.22–5.81)
RDW: 15 % (ref 11.5–15.5)
WBC: 15.6 10*3/uL — ABNORMAL HIGH (ref 4.0–10.5)
nRBC: 0 % (ref 0.0–0.2)

## 2021-07-31 LAB — MAGNESIUM: Magnesium: 1.6 mg/dL — ABNORMAL LOW (ref 1.7–2.4)

## 2021-07-31 LAB — PHOSPHORUS: Phosphorus: 4 mg/dL (ref 2.5–4.6)

## 2021-07-31 MED ORDER — ENOXAPARIN SODIUM 60 MG/0.6ML IJ SOSY
50.0000 mg | PREFILLED_SYRINGE | Freq: Two times a day (BID) | INTRAMUSCULAR | Status: DC
  Administered 2021-07-31 – 2021-08-01 (×3): 50 mg via SUBCUTANEOUS
  Filled 2021-07-31 (×3): qty 0.6

## 2021-07-31 NOTE — Progress Notes (Signed)
Fecal management system rectal tube removed at this time. Tolerated removal well.  ?

## 2021-07-31 NOTE — Progress Notes (Signed)
Occupational Therapy Treatment Patient Details Name: DIANE MOCHIZUKI MRN: [id number] DOB: 1961/02/23 Today's Date: 07/31/2021   History of present illness Pt is a 61 y/o male admitted 06/29/21 following C3-7 ACDF after progressive cervical myelopathy symptoms including ataxia, bil UE and LE shaking and weakness, and loss of function in his hands with subsequent fall. Pt initially had improvement in neurological symptoms, but postoperative course was complicated by dysphasia and ataxia secondary to and epidural hematoma with evacuation of hematoma on 2/7. Course complicated by delirium secondary to encephalopathy, sepsis secondary to aspiration PNA, reintubated 2/19-2/26 for airway protection, bilat segmental PEs 2/18. PEG placed 3/6. PMHx includes tobacco and EtOH use disorder.   OT comments  Pt continues to be significantly limited by global weakness, impaired cognition, and poor activity tolerance. He required total A for rolling and re-positioning in bed. Pt noted to have improved active movements in distal LUE. Completed BUE AAROM exercises as well as functional grasp and release repetitions with LUE in preparation for self feeding/ ADLs. Pt able to grasp large cylindrical item, supinate, pronate and release x10 this date with min A. Pt continues to benefit from OT acutely. D/c recommendation remains appropriate.    Recommendations for follow up therapy are one component of a multi-disciplinary discharge planning process, led by the attending physician.  Recommendations may be updated based on patient status, additional functional criteria and insurance authorization.    Follow Up Recommendations  Skilled nursing-short term rehab (<3 hours/day)    Assistance Recommended at Discharge Frequent or constant Supervision/Assistance  Patient can return home with the following  Two people to help with walking and/or transfers;Direct supervision/assist for medications management;Assist for  transportation;Help with stairs or ramp for entrance;Assistance with feeding;Two people to help with bathing/dressing/bathroom;Assistance with cooking/housework;Direct supervision/assist for Chartered loss adjuster (measurements OT);Wheelchair cushion (measurements OT);Hospital bed;BSC/3in1;Tub/shower seat    Recommendations for Other Services      Precautions / Restrictions Precautions Precautions: Fall Precaution Booklet Issued: Yes (comment) Precaution Comments: Verbally reviewed precautions Required Braces or Orthoses: Cervical Brace Cervical Brace: Soft collar;For comfort;Other (comment) Restrictions Weight Bearing Restrictions: No       Mobility Bed Mobility Overal bed mobility: Needs Assistance Bed Mobility: Rolling Rolling: Total assist              Transfers Overall transfer level: Needs assistance                 General transfer comment: focused on bed level     Balance                                           ADL either performed or assessed with clinical judgement   ADL Overall ADL's : Needs assistance/impaired Eating/Feeding: Total assistance Eating/Feeding Details (indicate cue type and reason): ultimately total A however making functional progress with grasp/release of L hand and with active movements of supination                                 Functional mobility during ADLs: Total assistance (at bed level) General ADL Comments: continues to require total A for all aspects of care. making functional progress towards goals. session focused on grasp/release, AAROM and bed mobility    Extremity/Trunk Assessment Upper Extremity Assessment Upper Extremity Assessment: RUE deficits/detail;LUE deficits/detail RUE  Deficits / Details: Can supinate and start to bring hand to mouth, trace shoulder flexion and abduction, 2/5 shoulder retraction and adduction, some active flexion/ext  noted in finger, ablet to isolate thumb to a "thumbs up" position RUE Sensation: decreased light touch;decreased proprioception RUE Coordination: decreased fine motor;decreased gross motor LUE Deficits / Details: Can supinate and start to bring hand to mouth, trace shoulder flexion and abduction, 2/5 shoulder retraction and adduction, 75% of active range available in hand and wrist fro gross grasp and flex/extension. benefits from cues and tapping LUE Sensation: decreased light touch LUE Coordination: decreased fine motor;decreased gross motor   Lower Extremity Assessment Lower Extremity Assessment: Defer to PT evaluation        Vision   Vision Assessment?: No apparent visual deficits   Perception Perception Perception: Not tested   Praxis Praxis Praxis: Not tested    Cognition Arousal/Alertness: Awake/alert Behavior During Therapy: Flat affect Overall Cognitive Status: Impaired/Different from baseline Area of Impairment: Orientation, Attention, Memory, Following commands, Safety/judgement, Awareness, Problem solving                 Orientation Level: Disoriented to, Time, Situation Current Attention Level: Sustained Memory: Decreased short-term memory, Decreased recall of precautions Following Commands: Follows one step commands with increased time Safety/Judgement: Decreased awareness of safety, Decreased awareness of deficits Awareness: Intellectual Problem Solving: Difficulty sequencing, Requires verbal cues, Requires tactile cues, Slow processing General Comments: pt responds to ~50% of questions, talks very quietly and some expressive communication difficulties noted. follows ~75% of commands        Exercises General Exercises - Upper Extremity Elbow Flexion: AAROM, Both, 10 reps, Supine Elbow Extension: AAROM, Both, 10 reps, Supine Wrist Flexion: AAROM, Both, 10 reps, Supine Wrist Extension: AAROM, Both, 10 reps, Supine Digit Composite Flexion: AAROM, Both,  10 reps, Supine Composite Extension: AAROM, Both, 10 reps, Supine Hand Exercises Forearm Supination: AAROM, Both, 10 reps, Supine Forearm Pronation: AAROM, Both, 10 reps, Supine Wrist Flexion: AAROM, Both, 10 reps, Supine Wrist Extension: AAROM, Left, 10 reps, Supine Digit Composite Flexion: AAROM, Both, 10 reps, Supine Composite Extension: AAROM, Both, 10 reps, Supine Thumb Abduction: AAROM, Both, 10 reps, Supine Thumb Adduction: AAROM, Both, 10 reps, Supine    Shoulder Instructions       General Comments VSS on RA    Pertinent Vitals/ Pain       Pain Assessment Pain Assessment: No/denies pain Pain Intervention(s): Monitored during session  Home Living                                          Prior Functioning/Environment              Frequency  Min 2X/week        Progress Toward Goals  OT Goals(current goals can now be found in the care plan section)  Progress towards OT goals: Goals met and updated - see care plan  Acute Rehab OT Goals Patient Stated Goal: did not state OT Goal Formulation: With patient Time For Goal Achievement: 08/06/21 Potential to Achieve Goals: Fair ADL Goals Pt Will Perform Eating: with min assist;with assist to don/doff brace/orthosis;with adaptive utensils;sitting Pt Will Perform Grooming: with mod assist;with adaptive equipment;sitting Pt Will Perform Upper Body Bathing: with mod assist;with adaptive equipment;bed level Pt/caregiver will Perform Home Exercise Program: Increased ROM;Increased strength;Both right and left upper extremity;With theraputty;With minimal assist;With written HEP provided Additional  ADL Goal #1: Pt will grasp objects with R hand using tenodesis grasp adn bring to mouth 10/10 trials in prep for self feeding Additional ADL Goal #2: Pt will grasp/release 5/5 objects with L hand to increase indep with ADLs  Plan Discharge plan remains appropriate    Co-evaluation                  AM-PAC OT "6 Clicks" Daily Activity     Outcome Measure   Help from another person eating meals?: Total Help from another person taking care of personal grooming?: Total Help from another person toileting, which includes using toliet, bedpan, or urinal?: Total Help from another person bathing (including washing, rinsing, drying)?: Total Help from another person to put on and taking off regular upper body clothing?: Total Help from another person to put on and taking off regular lower body clothing?: Total 6 Click Score: 6    End of Session    OT Visit Diagnosis: Other abnormalities of gait and mobility (R26.89);Muscle weakness (generalized) (M62.81);Feeding difficulties (R63.3)   Activity Tolerance Patient tolerated treatment well   Patient Left in bed;with call bell/phone within reach;with bed alarm set   Nurse Communication Mobility status;Precautions        Time: 5170-0174 OT Time Calculation (min): 19 min  Charges: OT General Charges $OT Visit: 1 Visit OT Treatments $Therapeutic Exercise: 8-22 mins   Dameer Speiser A Marnita Poirier 07/31/2021, 5:11 PM

## 2021-07-31 NOTE — Progress Notes (Signed)
PROGRESS NOTE    Brian [name]  VPX:[id number] DOB: 25-Aug-1960 DOA: 06/29/2021 PCP: Brian [name], No    Brief Narrative:  Brian [name] is a 61 year old male with past medical history significant for tobacco and EtOH use disorder who was admitted by neurosurgery for cervical myelopathy due to critical multilevel cervical spinal canal stenosis C3-6 with spinal cord compression and spinal cord signal change after recent fall with progressive upper and lower extremity weakness and tremors.  He was admitted on 2/3 and underwent ACDF of C3-4, C4-5, C5-5, and C6-7.  Post-operatively he was progressing slowly with residual weakness in both hands but improving lower extremity strength and function.  He developed some difficulty swallowing on 2/5 and SLP ordered and placed on thickened liquids.   Overnight 2/6, patient with increased difficulty swallowing and was made NPO but also noted to have dysarthria and difficulty moving extremities with numbness.  SLP evaluated and found to be moderate aspiration risk.   Also progressively tachycardic, tachypneic, and now febrile.  Code stroke activated and taken for CT/ CTA head and neck and was given decadron 40m once.  NIHSS 18.  CTH was negative for acute findings, and CTA head neck did not reveal any LVO but noted significant prevertebral soft tissue swelling with foci of gas present at the ventral epidural space at C4-5, small collection not excluded. On 2/7, patient with worsening confusion and concern for airway involvement, PCCM consulted and remained in the intensive care unit until he is transferred to the floor on 3/2 and [facility] under [facility] service.  Significant Hospital Events: 2/3 ACDF C3-4, C4-5, C5-5, and C6-7 w/ Dr. PAnnette Stable2/6 SLP eval for difficulty swallowing 2/7 Code stroke overnight, neg for LVO, CT showing soft tissue swelling.  PCCM consulted for concern of airway  management and AMS.  Went for evacuation of hematoma  2/18 CT Abd/Pelvis: showing bilateral segmental Pes, started on heparin and showing worsening pneumonia, febrile to  103.  PCT rising, 34.6, restarted on abx 2/19 possible aspiration w/ severe hypoxia; intubated; Switching from heparin to angiomax given subtherapeutic levels despite increase in rate. 2/20 Echo shows normal LV systolic function. There was notation of a + McConnell's sign c/w a large pulmonary embolus which is consistent with the finding of bilateral segmental pulmonary emboli noted on CT 07/14/2021. 2/21 rash appreciated on back; stopped cefepim/vanc switched to zosyn; required low dose levo with increase in fentanyl 2/22 remains intubated; unable to extubate to do increase RR and thick secretions 2/23 [name] infusion added 2/24: weaning fentanyl.  2/26 extubated 2/28 tachycardia persists 3/2: Modified barium swallow, continues n.p.o. with core track in place, likely will need PEG tube. 3/6: s/p IR G-tube placement     Assessment & Plan:   Assessment and Plan: * [place]ical myel[place] ([place]; severe cervical spinal stenosis with cord compression s/p ACDF CI6-2on 2/3, complicated by epidural hematoma s/p evacuation 2/7 Patient initially presenting to ED after recent fall with progressive upper and lower extremity weakness and tremors, was found to have critical multilevel cervical spinal stenosis C3-3 6 with spinal cord compression and spinal cord signal change and underwent ACDF C3-4, C4-5, C5-5, and C6/7 by neurosurgery Dr. PTrenton Gammonon 06/29/2021.  Postoperative leg complicated by epidural hematoma s/p evacuation on 2/7. --Continue PT/OT/SLP efforts --Further per primary neurosurgery  Acute respiratory failure with hypoxia ([place] Etiology likely multifactorial with significant dysphagia with recurrent aspiration pneumonia events during initial hospitalization following ACDF surgery  with postoperative complication of postoperative  cervical hematoma.  Patient did require ventilatory support in the intensive care unit and was successfully extubated on 07/22/2021.  Patient completed extensive course of empiric antibiotics.   --Aspiration precautions --Oxygen now weaned off, with SPO2 98% on room air    Acute pulmonary embolism (HCC) Bilateral segmental PE noted on CTA chest.  LE [place] negative for DVT.  TTE with normal LV systolic function, notation of positive McConnell sign consistent with a large pulmonary embolism which is consistent with the finding of bilateral segmental pulmonary emboli at on CT. --restart anticoagulation with treatment dose lovenox with plan to resume Eliquis 5mg  BID once gtube available for use per IR --Oxygen now weaned off  Acute metabolic encephalopathy CT head, MRI brain, EEG, TSH, B12, ammonia unrevealing.  Etiology likely multifactorial with acute respiratory failure secondary to aspiration pneumonia, bilateral pulmonary embolism.  Completed course of antibiotics. --Decreased Seroquel to 25 mg BID --Decreased clonazepam to 1 mg BID  Severe sepsis due to recurrent aspiration pneumonia Likely recurrent issue during hospitalization, remains n.p.o. due to continued aspiration risk per SLP.  Completed extensive course of antibiotics while under ICU care. --N.p.o., tube feeds; s/p IR gtube 3/6 --Aspiration precautions  Sinus tachycardia Multifactorial including sepsis, dehydration, PE, agitation.   --Metoprolol tartrate 100 mg BID --Continue to monitor on telemetry   Elevated liver enzymes Etiology likely secondary to sepsis from aspiration pneumonia as above.  Acute hepatitis panel negative.  Right upper quadrant ultrasound with no focal liver lesion, parenchymal with normal echogenicity. --Continue monitor LFTs intermittently  Hyponatremia Etiology likely secondary to dehydration.  Urine sodium low.  Improved with IV fluids.  Sodium now normalized on tube feeds. --Continue intermittent  monitoring of CMP  Tobacco use disorder Encouraged cessation. --Nicotine patch  Physical deconditioning Significant weakness in all extremities as a result of cervical myelopathy and acute illness. --Continue PT/OT efforts, will need SNF placement  Urinary retention --Bethanechol --Continue monitor urinary output  Protein-calorie malnutrition, severe ([place]) As evidenced by poor p.o. intake, significant muscle mass and subcu fat loss and significant weight loss (about 16 pounds since this hospitalization). Nutrition Problem: Severe Malnutrition (in the context of social/environmental circumstances) Etiology:  (inadequate energy intake) Signs/Symptoms: mild fat depletion, severe muscle depletion, severe muscle depletion Interventions: Refer to RD note for recommendations  -- Dietitian following, appreciate assistance.   --s/p IR gtube 3/6.  --Continue tube feeds --continue SLP efforts  Hyperglycemia-resolved as of 07/12/2021 Likely due to steroid.  Resolved.  A1c 5.2%.     DVT prophylaxis: SCD's Start: 06/29/21 1231    Code Status: Full Code Family Communication: No family present at bedside this morning  Disposition Plan:  Level of care: Progressive Status is: Inpatient  Remains inpatient appropriate because: Will need SNF placement, will be difficult per TOC   Procedures:  ACDF C3-7, neurosurgery Dr. Trenton Gammon 2/3 Hematoma evacuation 2/7 Vascular duplex ultrasound bilateral lower extremities 2/19 TTE 2/20 TTE 3/1 IR G-tube placement 3/6  Antimicrobials:  Vancomycin 2/7 - 2/8; 2/18 - 2/21 Zosyn 2/21 -2/23 Ampicillin 2/7 -2/11 Metronidazole 2/7 - 2/8; 2/17 -2/18 Cefepime 2/18 - 2/21 Perioperative cefazolin  Subjective: Patient seen examined bedside, resting comfortably.  No specific complaints this morning other than continues with global weakness.  G-tube placed yesterday, per IR only liquid medicine at this time and holding tube feeds.  Given unable to utilize  crushed medicine, will start treatment dose Lovenox until cleared by IR for further G-tube use.  No family present.  No  other complaints or questions at this time. Denies chest pain, no headache, no shortness of breath, no abdominal pain.  No acute concerns overnight per nursing staff.  Will need SNF placement; which will be difficult per discussions with TOC.  Objective: Vitals:   07/31/21 0000 07/31/21 0344 07/31/21 0801 07/31/21 1154  BP: 118/81 121/85 119/86 134/88  Pulse: 91 (!) 101 (!) 110 (!) 102  Resp: (!) 21 16 20 20   Temp: 98.6 F (37 C) 99.1 F (37.3 C) 98.6 F (37 C) 98.3 F (36.8 C)  TempSrc:  Oral Oral Axillary  SpO2: 97% 98% 100% 100%  Weight:  49 kg    Height:        Intake/Output Summary (Last 24 hours) at 07/31/2021 1202 Last data filed at 07/31/2021 [id number] Gross per 24 hour  Intake 1892.44 ml  Output 1675 ml  Net 217.44 ml   Filed Weights   07/29/21 0500 07/30/21 0423 07/31/21 0344  Weight: 50 kg 52 kg 49 kg    Examination:  Physical Exam: GEN: NAD, alert, confused, cachectic/chronically ill in appearance appears older than stated age HEENT: NCAT, PERRL, EOMI, sclera clear, dry mucous membranes PULM: CTAB w/o wheezes/crackles, normal respiratory effort, on 3 L [place], with SPO2 100% at rest CV: Tachycardic, regular rhythm w/o M/G/R GI: abd soft, NTND, NABS, no R/G/M, G-tube noted with abdominal binder in place MSK: no peripheral edema, decreased muscle strength bilateral upper/lower extremities NEURO: Awake, alert, slightly confused, decreased muscle strength bilateral upper/lower extremities but notably increased movement of right upper extremity and left lower extremity but remains weak RLE/LUE PSYCH: Depressed mood, flat affect    Data Reviewed: I have personally reviewed following labs and imaging studies  CBC: Recent Labs  Lab 07/27/21 0348 07/31/21 0113  WBC 13.8* 15.6*  HGB 9.3* 10.1*  HCT 28.4* 30.6*  MCV 96.6 94.2  PLT 743* 811*   Basic  Metabolic Panel: Recent Labs  Lab 07/25/21 0758 07/26/21 0334 07/27/21 0348 07/31/21 0113  NA 139   139 140 136 129*  K 3.2*   3.2* 3.9 3.6 4.2  CL 107   107 109 105 97*  CO2 23   22 21* 22 22  GLUCOSE 124*   127* 110* 112* 92  BUN 22*   22* 20 15 13   CREATININE 0.67   0.65 0.59* 0.51* 0.59*  CALCIUM 9.4   9.4 9.4 9.4 9.5  MG  --  1.9 1.7 1.6*  PHOS 3.0 3.2  --  4.0   GFR: Estimated Creatinine Clearance: 68.1 mL/min (A) (by C-G formula based on SCr of 0.59 mg/dL (L)). Liver Function Tests: Recent Labs  Lab 07/25/21 0758 07/26/21 0334 07/27/21 0348 07/31/21 0113  AST 109*  --  100* 46*  ALT 103*  --  108* 60*  ALKPHOS 324*  --  296* 237*  BILITOT 0.6  --  0.4 0.8  PROT 8.2*  --  7.3 8.1  ALBUMIN 2.2*   2.2* 2.2* 2.2* 2.5*   No results for input(s): LIPASE, AMYLASE in the last 168 hours. No results for input(s): AMMONIA in the last 168 hours.  Coagulation Profile: No results for input(s): INR, PROTIME in the last 168 hours. Cardiac Enzymes: No results for input(s): CKTOTAL, CKMB, CKMBINDEX, TROPONINI in the last 168 hours. BNP (last 3 results) No results for input(s): PROBNP in the last 8760 hours. HbA1C: No results for input(s): HGBA1C in the last 72 hours. CBG: Recent Labs  Lab 07/31/21 0002 07/31/21 0359 07/31/21 0432 07/31/21  0803 07/31/21 1152  GLUCAP 102* 103* 104* 97 95   Lipid Profile: No results for input(s): CHOL, HDL, LDLCALC, TRIG, CHOLHDL, LDLDIRECT in the last 72 hours. Thyroid Function Tests: No results for input(s): TSH, T4TOTAL, FREET4, T3FREE, THYROIDAB in the last 72 hours. Anemia Panel: No results for input(s): VITAMINB12, FOLATE, FERRITIN, TIBC, IRON, RETICCTPCT in the last 72 hours. Sepsis Labs: No results for input(s): PROCALCITON, LATICACIDVEN in the last 168 hours.  No results found for this or any previous visit (from the past 240 hour(s)).        Radiology Studies: IR GASTROSTOMY TUBE MOD SED  Result Date:  07/30/2021 CLINICAL DATA:  Bilateral pulmonary emboli, cervical myelopathy with cord compression, needs enteral feeding support EXAM: PERC PLACEMENT GASTROSTOMY FLUOROSCOPY: Radiation Exposure Index (as provided by the fluoroscopic device): 15 mGy air Kerma TECHNIQUE: The procedure, risks, benefits, and alternatives were explained to the patient. Questions regarding the procedure were encouraged and answered. The patient understands and consents to the procedure. As antibiotic prophylaxis, cefazolin 2 g was ordered pre-procedure and administered intravenously within one hour of incision.Progression of previously administered oral barium into the colon was confirmed fluoroscopically. A 5 French angiographic catheter was placed as orogastric tube. The upper abdomen was prepped with Betadine, draped in usual sterile fashion, and infiltrated locally with 1% lidocaine. Intravenous Fentanyl 31mcg and Versed 1mg  were administered as conscious sedation during continuous monitoring of the patient's level of consciousness and physiological / cardiorespiratory status by the radiology RN, with a total moderate sedation time of 10 minutes. 0.5 mg glucagon given IV to facilitate gastric distention. Stomach was insufflated using air through the orogastric tube. An 27 French sheath needle was advanced percutaneously into the gastric lumen under fluoroscopy. Gas could be aspirated and a small contrast injection confirmed intraluminal spread. The sheath was exchanged over a guidewire for a 9 [place] vascular sheath, through which the snare device was advanced and used to snare a guidewire passed through the orogastric tube. This was withdrawn, and the snare attached to the 24 French pull-through gastrostomy tube, which was advanced antegrade, positioned with the internal bumper securing the anterior gastric wall to the anterior abdominal wall. Small contrast injection confirms appropriate positioning. The external bumper was applied  and the catheter was flushed. COMPLICATIONS: COMPLICATIONS none IMPRESSION: 1. Technically successful 24 French pull-through gastrostomy placement under fluoroscopy. Electronically Signed   By: Lucrezia Europe M.D.   On: 07/30/2021 16:12        Scheduled Meds:  bethanechol  10 mg Per Tube TID   chlorhexidine  15 mL Mouth Rinse BID   Chlorhexidine Gluconate Cloth  6 each Topical Daily   clonazepam  1 mg Per Tube BID   enoxaparin (LOVENOX) injection  50 mg Subcutaneous Q12H   feeding supplement (PROSource TF)  45 mL Per Tube TID   folic acid  1 mg Per Tube Daily   mouth rinse  15 mL Mouth Rinse q12n4p   metoprolol tartrate  100 mg Per Tube BID   multivitamin  15 mL Per Tube Daily   nicotine  21 mg Transdermal Daily   QUEtiapine  25 mg Per Tube BID   thiamine  100 mg Per Tube Daily   Continuous Infusions:  sodium chloride Stopped (07/23/21 1122)   feeding supplement (VITAL 1.5 CAL) Stopped (07/30/21 1900)   lactated ringers 75 mL/hr at 07/31/21 0628     LOS: 32 days    Time spent: 49 minutes spent on chart review, discussion with nursing  staff, consultants, updating family and interview/physical exam; more than 50% of that time was spent in counseling and/or coordination of care.    Shia Eber J [place] DO [facility] Available via Epic secure chat 7am-7pm After these hours, please refer to coverage provider listed on [email or web address] 07/31/2021, 12:02 PM

## 2021-07-31 NOTE — Progress Notes (Signed)
Speech Language Pathology Treatment: Dysphagia  ?Patient Details ?Name: Brian [name] ?MRN: [id number] ?DOB: Nov 13, 1960 ?Today's Date: 07/31/2021 ?Time: 1430-1450 ?SLP Time Calculation (min) (ACUTE ONLY): 20 min ? ?Assessment / Plan / Recommendation ?Clinical Impression ? Patient seen by SLP for skilled treatment session focused on dysphagia goals. When SLP arrived, patient was awake and alert, agreeable to session. His speech and voice would fluctuate and at times, he was almost completely unintelligible due to not opening mouth when talking and speaking at low vocal intensity. SLP reminded patient of strategy to turn head to left when swallowing and he did then perform this without requiring cues with ice chips. He exhibited prolonged mastication and oral transit with single ice chips but did not exhibit any overt s/s aspiration or penetration during or after swallow initiation. When taking spoon sips of honey thick liquids, patient started to turn head to right and required more frequent cues to turn to left. No overt s/s of oral or pharyngeal phase deficits with honey thick liquids and no change in vocal quality post swallows. SLP is recommending to continue NPO status at this time (PEG placed on 3/6) and SLP will continue to follow for dysphagia tx and PO readiness.  ? ?  ?HPI HPI: Pt is a 61 y/o admitted 06/29/21 following C3-7 ACDF after progressive cervical myelopathy symptoms with subsequent fall and severe incomplete SCI. Significant prevertebral swelling per imaging. Pt developed some difficulty swallowing on 2/5 and SLP consulted. MBS 2/6 revealed severe edema s/p ACDF, oral holding, a pharyngeal delay, inconsistent hyolaryngeal elevation, incomplete epiglottic inversion due to edema. Aspiration noted accross consistencies and and NPO status was recommended. Repeat MBS 2/10 showed significantly reduced pharyngeal edema and a dysphagia 2 diet with nectar thick liquids was initiated. Pt transitioned to thin  liquids 2/16 and susbequently returned to nectar thick 2/17 due to concern for aspiration. Pt developed pna, possible aspiration, requiring intubation 2/19-26. Cortrak placed 2/20. PEG placed in IR on 3/6. No significant PMH. ?  ?   ?SLP Plan ? Continue with current plan of care ? ?  ?  ?Recommendations for follow up therapy are one component of a multi-disciplinary discharge planning process, led by the attending physician.  Recommendations may be updated based on patient status, additional functional criteria and insurance authorization. ?  ? ?Recommendations  ?Diet recommendations: NPO ?Medication Administration: Via alternative means  ?   ?    ?   ? ? ? ? Oral Care Recommendations: Oral care QID;Oral care prior to ice chip/H20 ?Follow Up Recommendations: Skilled nursing-short term rehab (<3 hours/day) ?Assistance recommended at discharge: Frequent or constant Supervision/Assistance ?SLP Visit Diagnosis: Dysphagia, pharyngeal phase (R13.13) ?Plan: Continue with current plan of care ? ? ? ? ?  ? ?Sonia Baller, MA, CCC-SLP ?Speech Therapy ?  ?

## 2021-07-31 NOTE — Progress Notes (Signed)
No new issues or problems.  Patient tolerated G-tube placement without difficulty. ? ?Afebrile.  Tachycardia persists.  Blood pressure good.  Urine output good.  Foley remains in place.  Remains pleasantly confused.  Quadriparesis unchanged.  Wound clean and dry. ? ?Status post multilevel anterior cervical decompression and fusion surgery for treatment of his severe compressive cervical myelopathy.  Patient with rocky postoperative course with numerous complicating features.  Continue anticoagulation and rehab efforts.  Patient will likely need long-term skilled nursing facility for further convalescence. ?

## 2021-07-31 NOTE — Progress Notes (Signed)
ANTICOAGULATION CONSULT NOTE - Initial Consult ? ?Pharmacy Consult for Lovenox ?Indication: pulmonary embolus ? ?No Known Allergies ? ?Patient Measurements: ?Height: 5\' 10"  (177.8 cm) ?Weight: 49 kg (108 lb 0.4 oz) ?IBW/kg (Calculated) : 73 ? ?Vital Signs: ?Temp: 99.1 ?F (37.3 ?C) (03/07 0344) ?Temp Source: Oral (03/07 0344) ?BP: 121/85 (03/07 0344) ?Pulse Rate: 101 (03/07 0344) ? ?Labs: ?Recent Labs  ?  07/31/21 ?0113  ?HGB 10.1*  ?HCT 30.6*  ?PLT 811*  ?CREATININE 0.59*  ? ? ?Estimated Creatinine Clearance: 68.1 mL/min (A) (by C-G formula based on SCr of 0.59 mg/dL (L)). ? ? ?Medical History: ?History reviewed. No pertinent past medical history. ? ?Medications:  ?Eliquis 5 mg PO BID ? 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 R=Remained in the intensive care unit until he is transferred to the floor on 3/2 and PCCM requested transition to [facility] for medical assistance while patient remains under the neurosurgery service. 3/2: Modified barium swallow, continues n.p.o. with core track in place, likely will need PEG tube. 3/6: s/p IR G-tube placement. Pharmacy consulted to dose Lovenox until patient is able to resume oral Eliquis. ? ?Goal of Therapy:  ?Anti-Xa level 0.6-1 units/ml 4hrs after LMWH dose given ?Monitor platelets by anticoagulation protocol: Yes ?  ?Plan:  ?Lovenox 50 mg SubQ twice daily ?Monitor CBC, bleeding/bruising ? ?Carma Lair, PharmD Candidate [phone] ?07/31/2021,7:14 AM ? ? ?

## 2021-07-31 NOTE — Progress Notes (Signed)
Physical Therapy Treatment ?Patient Details ?Name: Brian [name] ?MRN: [id number] ?DOB: 02-14-1961 ?Today's Date: 07/31/2021 ? ? ?History of Present Illness Pt is a 61 y/o male admitted 06/29/21 following C3-7 ACDF after progressive cervical myelopathy symptoms including ataxia, bil UE and LE shaking and weakness, and loss of function in his hands with subsequent fall. Pt initially had improvement in neurological symptoms, but postoperative course was complicated by dysphasia and ataxia secondary to and epidural hematoma with evacuation of hematoma on 2/7. Course complicated by delirium secondary to encephalopathy, sepsis secondary to aspiration PNA, reintubated 2/19-2/26 for airway protection, bilat segmental PEs 2/18. PEG placed 3/6. PMHx includes tobacco and EtOH use disorder. ? ?  ?PT Comments  ? ? Pt continues to demonstrate poor mobility related to significant deficits in cognition, strength, and balance.  Continue to recommend SNF at DC.   ?Recommendations for follow up therapy are one component of a multi-disciplinary discharge planning process, led by the attending physician.  Recommendations may be updated based on patient status, additional functional criteria and insurance authorization. ? ?Follow Up Recommendations ? Skilled nursing-short term rehab (<3 hours/day) ?  ?  ?Assistance Recommended at Discharge Frequent or constant Supervision/Assistance  ?Patient can return home with the following Two people to help with walking and/or transfers;Two people to help with bathing/dressing/bathroom;Direct supervision/assist for medications management;Direct supervision/assist for financial management;Assist for transportation;Assistance with feeding;Assistance with cooking/housework;Help with stairs or ramp for entrance ?  ?Equipment Recommendations ? Wheelchair (measurements PT);Hospital bed;Wheelchair cushion (measurements PT);BSC/3in1;Other (comment) (hoyer lift)  ?  ?Recommendations for Other Services   ? ? ?   ?Precautions / Restrictions Precautions ?Precautions: Fall ?Required Braces or Orthoses: Cervical Brace ?Cervical Brace: Soft collar;For comfort;Other (comment) (not donned for session)  ?  ? ?Mobility ? Bed Mobility ?Overal bed mobility: Needs Assistance ?Bed Mobility: Rolling, Sidelying to Sit, Sit to Sidelying ?Rolling: Max assist, +2 for physical assistance ?Sidelying to sit: Total assist, +2 for physical assistance ?  ?  ?Sit to sidelying: +2 for physical assistance, Max assist ?General bed mobility comments: Assist with all aspects. ?  ? ?Transfers ?  ?  ?  ?  ?  ?  ?  ?  ?  ?General transfer comment: Attempted to stand with +2 assist and use of Stedy. Pt unable to rise at all from bed. ?  ? ?Ambulation/Gait ?  ?  ?  ?  ?  ?  ?  ?  ? ? ?Stairs ?  ?  ?  ?  ?  ? ? ?Wheelchair Mobility ?  ? ?Modified Rankin (Stroke Patients Only) ?  ? ? ?  ?Balance Overall balance assessment: Needs assistance ?Sitting-balance support: Bilateral upper extremity supported, Feet supported ?Sitting balance-Leahy Scale: Poor ?Sitting balance - Comments: Sat EOB x 10 minutes with mod assist ?Postural control: Posterior lean ?  ?  ?  ?  ?  ?  ?  ?  ?  ?  ?  ?  ?  ?  ?  ? ?  ?Cognition Arousal/Alertness: Awake/alert ?Behavior During Therapy: Flat affect ?Overall Cognitive Status: Impaired/Different from baseline ?Area of Impairment: Orientation, Attention, Memory, Following commands, Safety/judgement, Awareness, Problem solving ?  ?  ?  ?  ?  ?  ?  ?  ?Orientation Level: Disoriented to, Time, Situation ?Current Attention Level: Sustained ?Memory: Decreased short-term memory, Decreased recall of precautions ?Following Commands: Follows one step commands with increased time ?Safety/Judgement: Decreased awareness of safety, Decreased awareness of deficits ?Awareness: Intellectual ?Problem Solving: Difficulty sequencing,  Requires verbal cues, Requires tactile cues, Slow processing ?  ?  ?  ? ?  ?Exercises   ? ?  ?General Comments General  comments (skin integrity, edema, etc.): HR to 120's with activity ?  ?  ? ?Pertinent Vitals/Pain Pain Assessment ?Pain Assessment: Faces ?Faces Pain Scale: Hurts a little bit ?Pain Location: "all over" during mobility ?Pain Descriptors / Indicators: Grimacing  ? ? ?Home Living   ?  ?  ?  ?  ?  ?  ?  ?  ?  ?   ?  ?Prior Function    ?  ?  ?   ? ?PT Goals (current goals can now be found in the care plan section) Progress towards PT goals: Not progressing toward goals - comment ? ?  ?Frequency ? ? ? Min 2X/week ? ? ? ?  ?PT Plan Current plan remains appropriate;Frequency needs to be updated  ? ? ?Co-evaluation   ?  ?  ?  ?  ? ?  ?AM-PAC PT "6 Clicks" Mobility   ?Outcome Measure ? Help needed turning from your back to your side while in a flat bed without using bedrails?: Total ?Help needed moving from lying on your back to sitting on the side of a flat bed without using bedrails?: Total ?Help needed moving to and from a bed to a chair (including a wheelchair)?: Total ?Help needed standing up from a chair using your arms (e.g., wheelchair or bedside chair)?: Total ?Help needed to walk in hospital room?: Total ?Help needed climbing 3-5 steps with a railing? : Total ?6 Click Score: 6 ? ?  ?End of Session Equipment Utilized During Treatment: Gait belt ?Activity Tolerance: Patient tolerated treatment well ?Patient left: in bed;with call bell/phone within reach;with bed alarm set ?Nurse Communication: Mobility status ?PT Visit Diagnosis: Unsteadiness on feet (R26.81);Muscle weakness (generalized) (M62.81);Other symptoms and signs involving the nervous system (R29.898);Difficulty in walking, not elsewhere classified (R26.2) ?  ? ? ?Time: [id number] ?PT Time Calculation (min) (ACUTE ONLY): 17 min ? ?Charges:  $Therapeutic Activity: 8-22 mins          ?          ? ?[facility] PT ?Acute Rehabilitation Services ?Pager [phone] ?Office [phone] ? ? ? ?Shary Decamp [facility] ?07/31/2021, 2:08 PM ? ?

## 2021-08-01 DIAGNOSIS — G959 Disease of spinal cord, unspecified: Secondary | ICD-10-CM | POA: Diagnosis not present

## 2021-08-01 LAB — CBC
HCT: 30.2 % — ABNORMAL LOW (ref 39.0–52.0)
Hemoglobin: 9.9 g/dL — ABNORMAL LOW (ref 13.0–17.0)
MCH: 30.2 pg (ref 26.0–34.0)
MCHC: 32.8 g/dL (ref 30.0–36.0)
MCV: 92.1 fL (ref 80.0–100.0)
Platelets: 754 10*3/uL — ABNORMAL HIGH (ref 150–400)
RBC: 3.28 MIL/uL — ABNORMAL LOW (ref 4.22–5.81)
RDW: 15 % (ref 11.5–15.5)
WBC: 12.5 10*3/uL — ABNORMAL HIGH (ref 4.0–10.5)
nRBC: 0 % (ref 0.0–0.2)

## 2021-08-01 LAB — GLUCOSE, CAPILLARY
Glucose-Capillary: 101 mg/dL — ABNORMAL HIGH (ref 70–99)
Glucose-Capillary: 101 mg/dL — ABNORMAL HIGH (ref 70–99)
Glucose-Capillary: 120 mg/dL — ABNORMAL HIGH (ref 70–99)
Glucose-Capillary: 110 mg/dL — ABNORMAL HIGH (ref 70–99)
Glucose-Capillary: 108 mg/dL — ABNORMAL HIGH (ref 70–99)
Glucose-Capillary: 142 mg/dL — ABNORMAL HIGH (ref 70–99)

## 2021-08-01 LAB — BASIC METABOLIC PANEL
Anion gap: 9 (ref 5–15)
BUN: 11 mg/dL (ref 6–20)
CO2: 22 mmol/L (ref 22–32)
Calcium: 9.4 mg/dL (ref 8.9–10.3)
Chloride: 97 mmol/L — ABNORMAL LOW (ref 98–111)
Creatinine, Ser: 0.59 mg/dL — ABNORMAL LOW (ref 0.61–1.24)
GFR, Estimated: >60 mL/min (ref >60)
Glucose, Bld: 93 mg/dL (ref 70–99)
Potassium: 3.7 mmol/L (ref 3.5–5.1)
Sodium: 128 mmol/L — ABNORMAL LOW (ref 135–145)

## 2021-08-01 MED ORDER — CLONAZEPAM 0.25 MG PO TBDP
0.5000 mg | ORAL_TABLET | Freq: Two times a day (BID) | ORAL | Status: DC
  Administered 2021-08-01 – 2021-08-06 (×10): 0.5 mg
  Filled 2021-08-01 (×10): qty 2

## 2021-08-01 MED ORDER — NICOTINE 14 MG/24HR TD PT24
14.0000 mg | MEDICATED_PATCH | Freq: Every day | TRANSDERMAL | Status: DC
  Administered 2021-08-02 – 2021-08-25 (×24): 14 mg via TRANSDERMAL
  Filled 2021-08-01 (×24): qty 1

## 2021-08-01 MED ORDER — QUETIAPINE FUMARATE 25 MG PO TABS
12.5000 mg | ORAL_TABLET | Freq: Two times a day (BID) | ORAL | Status: DC
  Administered 2021-08-01 – 2021-08-06 (×10): 12.5 mg
  Filled 2021-08-01 (×10): qty 1

## 2021-08-01 MED ORDER — APIXABAN 5 MG PO TABS
5.0000 mg | ORAL_TABLET | Freq: Two times a day (BID) | ORAL | Status: DC
  Administered 2021-08-01 – 2021-08-16 (×31): 5 mg
  Filled 2021-08-01 (×31): qty 1

## 2021-08-01 MED ORDER — DEXTROSE-NACL 5-0.9 % IV SOLN: INTRAVENOUS | Status: DC

## 2021-08-01 NOTE — Discharge Instructions (Addendum)
Information on my medicine - ELIQUIS (apixaban)  This medication education was reviewed with me or my healthcare representative as part of my discharge preparation.  Why was Eliquis prescribed for you? Eliquis was prescribed to treat blood clots that may have been found in the veins of your legs (deep vein thrombosis) or in your lungs (pulmonary embolism) and to reduce the risk of them occurring again.  What do You need to know about Eliquis ? Continue Eliquis 5 mg tablet taken TWICE daily.  Eliquis may be taken with or without food.   Try to take the dose about the same time in the morning and in the evening. If you have difficulty swallowing the tablet whole please discuss with your pharmacist how to take the medication safely.  Take Eliquis exactly as prescribed and DO NOT stop taking Eliquis without talking to the doctor who prescribed the medication.  Stopping may increase your risk of developing a new blood clot.  Refill your prescription before you run out.  After discharge, you should have regular check-up appointments with your healthcare provider that is prescribing your Eliquis.    What do you do if you miss a dose? If a dose of ELIQUIS is not taken at the scheduled time, take it as soon as possible on the same day and twice-daily administration should be resumed. The dose should not be doubled to make up for a missed dose.  Important Safety Information A possible side effect of Eliquis is bleeding. You should call your healthcare provider right away if you experience any of the following: Bleeding from an injury or your nose that does not stop. Unusual colored urine (red or dark brown) or unusual colored stools (red or black). Unusual bruising for unknown reasons. A serious fall or if you hit your head (even if there is no bleeding).  Some medicines may interact with Eliquis and might increase your risk of bleeding or clotting while on Eliquis. To help avoid this,  consult your healthcare provider or pharmacist prior to using any new prescription or non-prescription medications, including herbals, vitamins, non-steroidal anti-inflammatory drugs (NSAIDs) and supplements.  This website has more information on Eliquis (apixaban): http://www.eliquis.com/eliquis/home    Activity Walk each and every day, increasing distance each day. Avoid excessive neck motion.  Diet Resume your normal diet.  Call Your Doctor If Any of These Occur Redness, drainage, or swelling at the wound.  Temperature greater than 101 degrees. Severe pain not relieved by pain medication.  Follow Up Appt Call [phone] today for appointment in 4weeks if you don't already have one or for any problems.  If you have any hardware placed in your spine, you will need an x-ray before your appointment.

## 2021-08-01 NOTE — TOC Progression Note (Signed)
Transition of Care (TOC) - Progression Note  ? ? ?Patient Details  ?Name: Brian [name] ?MRN: [id number] ?Date of Birth: March 01, 1961 ? ?Transition of Care (TOC) CM/SW Contact  ?[place], LCSW ?Phone Number: ?08/01/2021, 10:33 AM ? ?Clinical Narrative:    ? ?Pt has no bed offers. Gayla Medicus with [facility] will [facility].  ? ?CSW emailed financial counseling; they are working on [employer] and confirmed that they will be submitted a disability application today and referring pt to [employer].  ? ?  ?Barriers to Discharge: SNF Pending bed offer, SNF Pending payor source - [facility], SNF Pending Medicaid, Inadequate or no insurance ? ?Expected Discharge Plan and Services ?  ?In-house Referral: Clinical Social Work ?  ?  ?  ?                ?  ?  ?  ?  ?  ?  ?  ?  ?  ?  ? ? ?Social Determinants of Health (SDOH) Interventions ?  ? ?Readmission Risk Interventions ?No flowsheet data found. ? ?

## 2021-08-01 NOTE — Progress Notes (Signed)
Overall stable.  Patient denies any neck pain.  No burning numbness or spasms.  States he is swallowing well.  His voice is strong.  His wound is clean dry and intact.  His quadriparesis is stable. ? ?Overall slowly progressing.  Seems to be recovering well from his pneumonia and pulmonary emboli.  Continues to make progress with regard to regaining strength secondary to his compressive cervical myelopathy.  Continue supportive efforts.  Look toward rehab/SNF placement. ?

## 2021-08-01 NOTE — Progress Notes (Signed)
Speech Language Pathology Treatment: Dysphagia  ?Patient Details ?Name: Brian [name] ?MRN: [id number] ?DOB: [birth year] ?Today's Date: 08/01/2021 ?Time: [id number] ?SLP Time Calculation (min) (ACUTE ONLY): 13 min ? ?Assessment / Plan / Recommendation ?Clinical Impression ? Pt was seen for dysphagia treatment. He was lethargic during the session and it was ultimately terminated prematurely for this reason. Pt required verbal and continued tactile cues for demonstration and maintenance of a left head turn position. Once tactile support was removed, pt returned his head to midline and completed the swallow in that position. Pt demonstrated throat clearing and multiple swallows with honey thick liquids via tsp. Throat clearing was reduced with support for the left head turn, but not eliminated. Effortful swallows were attempted, but pt's lethargy appeared to impact his ability to perform this effectively. SLP will continue to follow pt.   ?  ?HPI HPI: Pt is a 61 y/o admitted 06/29/21 following C3-7 ACDF after progressive cervical myelopathy symptoms with subsequent fall and severe incomplete SCI. Significant prevertebral swelling per imaging. Pt developed some difficulty swallowing on 2/5 and SLP consulted. MBS 2/6 revealed severe edema s/p ACDF, oral holding, a pharyngeal delay, inconsistent hyolaryngeal elevation, incomplete epiglottic inversion due to edema. Aspiration noted accross consistencies and and NPO status was recommended. Repeat MBS 2/10 showed significantly reduced pharyngeal edema and a dysphagia 2 diet with nectar thick liquids was initiated. Pt transitioned to thin liquids 2/16 and susbequently returned to nectar thick 2/17 due to concern for aspiration. Pt developed pna, possible aspiration, requiring intubation 2/19-26. Cortrak placed 2/20. PEG placed in IR on 3/6. No significant PMH. ?  ?   ?SLP Plan ? Continue with current plan of care ? ?  ?  ?Recommendations for follow up therapy are one component of  a multi-disciplinary discharge planning process, led by the attending physician.  Recommendations may be updated based on patient status, additional functional criteria and insurance authorization. ?  ? ?Recommendations  ?Diet recommendations: NPO ?Medication Administration: Via alternative means  ?   ?    ?   ? ? ? ? Oral Care Recommendations: Oral care QID;Oral care prior to ice chip/H20 ?Follow Up Recommendations: Skilled nursing-short term rehab (<3 hours/day) ?Assistance recommended at discharge: Frequent or constant Supervision/Assistance ?SLP Visit Diagnosis: Dysphagia, pharyngeal phase (R13.13) ?Plan: Continue with current plan of care ? ? ? ? ?  ?  ? ?Hailee Hollick I. Hardin Negus, [place], CCC-SLP ?Acute Rehabilitation Services ?Office number [phone] ?Pager [phone] ? ?Horton Marshall ? ?08/01/2021, 12:08 PM ? ? ? ?

## 2021-08-01 NOTE — Progress Notes (Addendum)
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  He was admitted on 2/3 and underwent ACDF of C3-4, C4-5, C5-5, and C6-7.  Post-operatively he was progressing slowly with residual weakness in both hands but improving lower extremity strength and function.  He developed some difficulty swallowing on 2/5 and SLP ordered and placed on thickened liquids.   Overnight 2/6, patient with increased difficulty swallowing and was made NPO but also noted to have dysarthria and difficulty moving extremities with numbness.  SLP evaluated and found to be moderate aspiration risk.   Also progressively tachycardic, tachypneic, and now febrile.  Code stroke activated and taken for CT/ CTA head and neck and was given decadron 40m once.  NIHSS 18.  CTH was negative for acute findings, and CTA head neck did not reveal any LVO but noted significant prevertebral soft tissue swelling with foci of gas present at the ventral epidural space at C4-5, small collection not excluded. On 2/7, patient with worsening confusion and concern for airway involvement, PCCM consulted and remained in the intensive care unit until he is transferred to the floor on 3/2 and [facility] under [facility] service.  Significant Hospital Events: 2/3 ACDF C3-4, C4-5, C5-5, and C6-7 w/ Dr. PAnnette Stable2/6 SLP eval for difficulty swallowing 2/7 Code stroke overnight, neg for LVO, CT showing soft tissue swelling.  PCCM consulted for concern of airway  management and AMS.  Went for evacuation of hematoma  2/18 CT Abd/Pelvis: showing bilateral segmental Pes, started on heparin and showing worsening pneumonia, febrile to  103.  PCT rising, 34.6, restarted on abx 2/19 possible aspiration w/ severe hypoxia; intubated; Switching from heparin to angiomax given subtherapeutic levels despite increase in rate. 2/20 Echo shows normal LV systolic function. There was notation of a + McConnell's sign c/w a large pulmonary embolus which is consistent with the finding of bilateral segmental pulmonary emboli noted on CT 07/14/2021. 2/21 rash appreciated on back; stopped cefepim/vanc switched to zosyn; required low dose levo with increase in fentanyl 2/22 remains intubated; unable to extubate to do increase RR and thick secretions 2/23 [name] infusion added 2/24: weaning fentanyl.  2/26 extubated 2/28 tachycardia persists 3/2: Modified barium swallow, continues n.p.o. with core track in place, likely will need PEG tube. 3/6: s/p IR G-tube placement     Assessment & Plan:   Assessment and Plan: * Cervical myelopathy ([place]; severe cervical spinal stenosis with cord compression s/p ACDF CI6-2on 2/3, complicated by epidural hematoma s/p evacuation 2/7 Patient initially presenting to ED after recent fall with progressive upper and lower extremity weakness and tremors, was found to have critical multilevel cervical spinal stenosis C3-3 6 with spinal cord compression and spinal cord signal change and underwent ACDF C3-4, C4-5, C5-5, and C6/7 by neurosurgery Dr. PTrenton Gammonon 06/29/2021.  Postoperative leg complicated by epidural hematoma s/p evacuation on 2/7. --Continue PT/OT/SLP efforts --Further per primary neurosurgery  Acute respiratory failure with hypoxia ([place] Etiology likely multifactorial with significant dysphagia with recurrent aspiration pneumonia events during initial hospitalization following ACDF surgery  with postoperative complication of postoperative  cervical hematoma.  Patient did require ventilatory support in the intensive care unit and was successfully extubated on 07/22/2021.  Patient completed extensive course of empiric antibiotics.   --Aspiration precautions --Oxygen now weaned off, with SPO2 98% on room air    Acute pulmonary embolism (HCC) Bilateral segmental PE noted on CTA chest.  LE [place] negative for DVT.  TTE with normal LV systolic function, notation of positive McConnell sign consistent with a large pulmonary embolism which is consistent with the finding of bilateral segmental pulmonary emboli at on CT. --Transition Lovenox back to Eliquis; since now able to use G-tube --Oxygen now weaned off  Acute metabolic encephalopathy CT head, MRI brain, EEG, TSH, B12, ammonia unrevealing.  Etiology likely multifactorial with acute respiratory failure secondary to aspiration pneumonia, bilateral pulmonary embolism.  Completed course of antibiotics. --Decreased Seroquel to 12.5 mg BID on 3/8; continue to wean --Decreased clonazepam to 0.5 mg BID on 3/8; continue to wean  Severe sepsis due to recurrent aspiration pneumonia Likely recurrent issue during hospitalization, remains n.p.o. due to continued aspiration risk per SLP.  Completed extensive course of antibiotics while under ICU care. --N.p.o., tube feeds; s/p IR gtube 3/6 --Aspiration precautions  Sinus tachycardia Multifactorial including sepsis, dehydration, PE, agitation.   --Metoprolol tartrate 100 mg BID --Continue to monitor on telemetry   Elevated liver enzymes Etiology likely secondary to sepsis from aspiration pneumonia as above.  Acute hepatitis panel negative.  Right upper quadrant ultrasound with no focal liver lesion, parenchymal with normal echogenicity. --Continue monitor LFTs intermittently  Hyponatremia Etiology likely secondary to dehydration.  Urine sodium low.  Improved with IV fluids.  Sodium now normalized on tube feeds. --Continue intermittent  monitoring of CMP  Tobacco use disorder Encouraged cessation. --Nicotine patch; decreased to 14 mg on 3/8  Physical deconditioning Significant weakness in all extremities as a result of cervical myelopathy and acute illness. --Continue PT/OT efforts, will need SNF placement  Urinary retention --Bethanechol --Foley catheter placed --Continue monitor urinary output  Protein-calorie malnutrition, severe ([place]) As evidenced by poor p.o. intake, significant muscle mass and subcu fat loss and significant weight loss (about 16 pounds since this hospitalization). Nutrition Problem: Severe Malnutrition (in the context of social/environmental circumstances) Etiology:  (inadequate energy intake) Signs/Symptoms: mild fat depletion, severe muscle depletion, severe muscle depletion Interventions: Refer to RD note for recommendations  -- Dietitian following, appreciate assistance.   --s/p IR gtube 3/6.  --Continue tube feeds --continue SLP efforts  Hyperglycemia-resolved as of 07/12/2021 Likely due to steroid.  Resolved.  A1c 5.2%.     DVT prophylaxis: SCD's Start: 06/29/21 1231 apixaban (ELIQUIS) tablet 5 mg    Code Status: Full Code Family Communication: No family present at bedside this morning  Disposition Plan:  Level of care: Telemetry Medical Status is: Inpatient  Remains inpatient appropriate because: Will need SNF placement, will be difficult per TOC   Procedures:  ACDF C3-7, neurosurgery Dr. Trenton Gammon 2/3 Hematoma evacuation 2/7 Vascular duplex ultrasound bilateral lower extremities 2/19 TTE 2/20 TTE 3/1 IR G-tube placement 3/6  Antimicrobials:  Vancomycin 2/7 - 2/8; 2/18 - 2/21 Zosyn 2/21 -2/23 Ampicillin 2/7 -2/11 Metronidazole 2/7 - 2/8; 2/17 -2/18 Cefepime 2/18 - 2/21 Perioperative cefazolin  Subjective: Patient seen examined bedside, resting comfortably.  Discussed with interventional radiology, okay to use G-tube for all medications and restart tube feeds  today.  Also discussed with pharmacy, will transition treatment dose Lovenox back to Eliquis today.  No family present.  No other  specific complaints or concerns per patient this morning.  Continues with global weakness, but has improved strength in his extremities.  Patient denies headache, no chest pain, no shortness of breath, no abdominal pain.  No acute concerns overnight per nursing staff.  Will need SNF placement, will be difficult per discussions with TOC.  Objective: Vitals:   07/31/21 1934 07/31/21 2314 08/01/21 0334 08/01/21 0802  BP: 131/88 125/90 127/88 140/90  Pulse: 95 88  99  Resp: (!) 25 (!) 22 (!) 24 (!) 22  Temp: 98.2 F (36.8 C) 99.3 F (37.4 C) 98.4 F (36.9 C) 98.6 F (37 C)  TempSrc: Axillary Oral Axillary Oral  SpO2: 99% 99% 99% 98%  Weight:      Height:        Intake/Output Summary (Last 24 hours) at 08/01/2021 1040 Last data filed at 07/31/2021 1800 Gross per 24 hour  Intake 942.17 ml  Output 600 ml  Net 342.17 ml   Filed Weights   07/29/21 0500 07/30/21 0423 07/31/21 0344  Weight: 50 kg 52 kg 49 kg    Examination:  Physical Exam: GEN: NAD, alert, confused, cachectic/chronically ill in appearance appears older than stated age HEENT: NCAT, PERRL, EOMI, sclera clear, dry mucous membranes PULM: CTAB w/o wheezes/crackles, normal respiratory effort, on room air CV: Tachycardic, regular rhythm w/o M/G/R GI: abd soft, NTND, NABS, no R/G/M, G-tube noted with abdominal binder in place GU: Foley catheter noted draining clear yellow urine in collection bag MSK: no peripheral edema, decreased muscle strength bilateral upper/lower extremities NEURO: Awake, alert, slightly confused, decreased muscle strength bilateral upper/lower extremities but notably increased movement of right upper extremity and left lower extremity but remains weak RLE/LUE PSYCH: Depressed mood, flat affect    Data Reviewed: I have personally reviewed following labs and imaging  studies  CBC: Recent Labs  Lab 07/27/21 0348 07/31/21 0113 08/01/21 0536  WBC 13.8* 15.6* 12.5*  HGB 9.3* 10.1* 9.9*  HCT 28.4* 30.6* 30.2*  MCV 96.6 94.2 92.1  PLT 743* 811* [id number]*   Basic Metabolic Panel: Recent Labs  Lab 07/26/21 0334 07/27/21 0348 07/31/21 0113 08/01/21 0536  NA 140 136 129* 128*  K 3.9 3.6 4.2 3.7  CL 109 105 97* 97*  CO2 21* 22 22 22   GLUCOSE 110* 112* 92 93  BUN 20 15 13 11   CREATININE 0.59* 0.51* 0.59* 0.59*  CALCIUM 9.4 9.4 9.5 9.4  MG 1.9 1.7 1.6*  --   PHOS 3.2  --  4.0  --    GFR: Estimated Creatinine Clearance: 68.1 mL/min (A) (by C-G formula based on SCr of 0.59 mg/dL (L)). Liver Function Tests: Recent Labs  Lab 07/26/21 0334 07/27/21 0348 07/31/21 0113  AST  --  100* 46*  ALT  --  108* 60*  ALKPHOS  --  296* 237*  BILITOT  --  0.4 0.8  PROT  --  7.3 8.1  ALBUMIN 2.2* 2.2* 2.5*   No results for input(s): LIPASE, AMYLASE in the last 168 hours. No results for input(s): AMMONIA in the last 168 hours.  Coagulation Profile: No results for input(s): INR, PROTIME in the last 168 hours. Cardiac Enzymes: No results for input(s): CKTOTAL, CKMB, CKMBINDEX, TROPONINI in the last 168 hours. BNP (last 3 results) No results for input(s): PROBNP in the last 8760 hours. HbA1C: No results for input(s): HGBA1C in the last 72 hours. CBG: Recent Labs  Lab 07/31/21 1554 07/31/21 1933 07/31/21 2313 08/01/21 0332 08/01/21 0806  GLUCAP 99 100* 108*  101* 101*   Lipid Profile: No results for input(s): CHOL, HDL, LDLCALC, TRIG, CHOLHDL, LDLDIRECT in the last 72 hours. Thyroid Function Tests: No results for input(s): TSH, T4TOTAL, FREET4, T3FREE, THYROIDAB in the last 72 hours. Anemia Panel: No results for input(s): VITAMINB12, FOLATE, FERRITIN, TIBC, IRON, RETICCTPCT in the last 72 hours. Sepsis Labs: No results for input(s): PROCALCITON, LATICACIDVEN in the last 168 hours.  No results found for this or any previous visit (from the past  240 hour(s)).        Radiology Studies: No results found.      Scheduled Meds:  apixaban  5 mg Per Tube BID   bethanechol  10 mg Per Tube TID   chlorhexidine  15 mL Mouth Rinse BID   Chlorhexidine Gluconate Cloth  6 each Topical Daily   clonazepam  0.5 mg Per Tube BID   feeding supplement (PROSource TF)  45 mL Per Tube TID   folic acid  1 mg Per Tube Daily   mouth rinse  15 mL Mouth Rinse q12n4p   metoprolol tartrate  100 mg Per Tube BID   multivitamin  15 mL Per Tube Daily   [START ON 08/02/2021] nicotine  14 mg Transdermal Daily   QUEtiapine  12.5 mg Per Tube BID   thiamine  100 mg Per Tube Daily   Continuous Infusions:  sodium chloride Stopped (07/23/21 1122)   dextrose 5 % and 0.9% NaCl 75 mL/hr at 08/01/21 0921   feeding supplement (VITAL 1.5 CAL) Stopped (07/30/21 1900)     LOS: 33 days    Time spent: 47 minutes spent on chart review, discussion with nursing staff, consultants, updating family and interview/physical exam; more than 50% of that time was spent in counseling and/or coordination of care.    Keniya Schlotterbeck J [place] DO [facility] Available via Epic secure chat 7am-7pm After these hours, please refer to coverage provider listed on [email or web address] 08/01/2021, 10:40 AM

## 2021-08-02 DIAGNOSIS — G959 Disease of spinal cord, unspecified: Secondary | ICD-10-CM | POA: Diagnosis not present

## 2021-08-02 LAB — COMPREHENSIVE METABOLIC PANEL
ALT: 37 U/L (ref 0–44)
AST: 30 U/L (ref 15–41)
Albumin: 2.3 g/dL — ABNORMAL LOW (ref 3.5–5.0)
Alkaline Phosphatase: 183 U/L — ABNORMAL HIGH (ref 38–126)
Anion gap: 10 (ref 5–15)
BUN: 11 mg/dL (ref 6–20)
CO2: 20 mmol/L — ABNORMAL LOW (ref 22–32)
Calcium: 9.3 mg/dL (ref 8.9–10.3)
Chloride: 103 mmol/L (ref 98–111)
Creatinine, Ser: 0.55 mg/dL — ABNORMAL LOW (ref 0.61–1.24)
GFR, Estimated: >60 mL/min (ref >60)
Glucose, Bld: 107 mg/dL — ABNORMAL HIGH (ref 70–99)
Potassium: 3.4 mmol/L — ABNORMAL LOW (ref 3.5–5.1)
Sodium: 133 mmol/L — ABNORMAL LOW (ref 135–145)
Total Bilirubin: 0.6 mg/dL (ref 0.3–1.2)
Total Protein: 7.4 g/dL (ref 6.5–8.1)

## 2021-08-02 LAB — PHOSPHORUS: Phosphorus: 3.7 mg/dL (ref 2.5–4.6)

## 2021-08-02 LAB — GLUCOSE, CAPILLARY
Glucose-Capillary: 105 mg/dL — ABNORMAL HIGH (ref 70–99)
Glucose-Capillary: 110 mg/dL — ABNORMAL HIGH (ref 70–99)
Glucose-Capillary: 121 mg/dL — ABNORMAL HIGH (ref 70–99)
Glucose-Capillary: 122 mg/dL — ABNORMAL HIGH (ref 70–99)
Glucose-Capillary: 123 mg/dL — ABNORMAL HIGH (ref 70–99)
Glucose-Capillary: 96 mg/dL (ref 70–99)

## 2021-08-02 LAB — MAGNESIUM: Magnesium: 1.6 mg/dL — ABNORMAL LOW (ref 1.7–2.4)

## 2021-08-02 MED ORDER — POTASSIUM CHLORIDE 20 MEQ PO PACK
40.0000 meq | PACK | Freq: Once | ORAL | Status: AC
  Administered 2021-08-02: 06:00:00 40 meq
  Filled 2021-08-02: qty 2

## 2021-08-02 MED ORDER — MAGNESIUM SULFATE 2 GM/50ML IV SOLN
2.0000 g | Freq: Once | INTRAVENOUS | Status: AC
  Administered 2021-08-02: 07:00:00 2 g via INTRAVENOUS
  Filled 2021-08-02: qty 50

## 2021-08-02 NOTE — Progress Notes (Signed)
? ?  Providing Compassionate, Quality Care - Together ? ? ?Subjective: ?Patient reports he "wants to go to the store." ? ?Objective: ?Vital signs in last 24 hours: ?Temp:  [97.7 ?F (36.5 ?C)-98.8 ?F (37.1 ?C)] 98.1 ?F (36.7 ?C) (03/09 1119) ?Pulse Rate:  [93-96] 93 (03/09 1119) ?Resp:  [13-23] 20 (03/09 1119) ?BP: (102-134)/(71-89) 105/84 (03/09 1119) ?SpO2:  [98 %-100 %] 99 % (03/09 1119) ?Weight:  [51.2 kg] 51.2 kg (03/09 0500) ? ?Intake/Output from previous day: ?03/08 0701 - 03/09 0700 ?In: 150  ?Out: 2950 [Urine:2950] ?Intake/Output this shift: ?No intake/output data recorded. ? ?Alert, confused, oriented to self ?PERRLA ?MAE, LLE >RLE ?Decreased fine motor BUE ?Incision is healing well ? ?Lab Results: ?Recent Labs  ?  07/31/21 ?0113 08/01/21 ?[id number]  ?WBC 15.6* 12.5*  ?HGB 10.1* 9.9*  ?HCT 30.6* 30.2*  ?PLT 811* 754*  ? ?BMET ?Recent Labs  ?  08/01/21 ?[id number] 08/02/21 ?0102  ?NA 128* 133*  ?K 3.7 3.4*  ?CL 97* 103  ?CO2 22 20*  ?GLUCOSE 93 107*  ?BUN 11 11  ?CREATININE 0.59* 0.55*  ?CALCIUM 9.4 9.3  ? ? ?Studies/Results: ?No results found. ? ?Assessment/Plan: ?Patient status post C3-4, C4-5, C5-6, C6-7 anterior cervical discectomy with interbody fusion by Dr. Annette Stable on 06/29/2021. Increased difficulty swallowing with lung atelectasis and elevated temperatures on 07/02/2021. Made NPO following MBS by SLP. 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 CTA head and neck negative for stroke. MRI revealed epidural hematoma. Patient underwent exploration of his cervical fusion with evacuation of the epidural hematoma on 07/03/2021. Cortrak was placed on 07/04/2021. Patient's strength much improved since epidural hematoma evacuation. Cortrak removed 07/06/2021 and dysphagia 2 diet with nectar thick liquids started. Patient with delirium vs ETOH withdrawal. Discontinued steroids 07/06/2021. CIWA protocol started and discontinued 07/07/2021. CT and MRI 07/07/2021 negative. Patient developed  respiratory distress, requiring intubation on 07/15/2021. Work up revealed aspiration PNA and PE. Patient was extubated on 07/22/2021. Foley catheter removed 07/24/2021. Patient transferred to 3W on 07/26/2021. Foley replaced 07/28/2021. ? ? LOS: 34 days  ? ?-Continue efforts at rehabilitation. ?-Goal is for SNF placement. ? ?Viona Gilmore, DNP, AGNP-C ?Nurse Practitioner ? ?[facility] ?[address], [place], [place] [address] ?[id number]:[id number]    F: [phone] ? ?08/02/2021, 11:49 AM ? ? ? ? ?

## 2021-08-02 NOTE — Progress Notes (Signed)
PROGRESS NOTE    Brian [name]  [id number] DOB: [birth year] DOA: 06/29/2021 PCP: Merryl Hacker, No    Brief Narrative:  Brian [name] is a 61 year old male with past medical history significant for tobacco and EtOH use disorder who was admitted by neurosurgery for cervical myelopathy due to critical multilevel cervical spinal canal stenosis C3-6 with spinal cord compression and spinal cord signal change after recent fall with progressive upper and lower extremity weakness and tremors.  He was admitted on 2/3 and underwent ACDF of C3-4, C4-5, C5-5, and C6-7.  Post-operatively he was progressing slowly with residual weakness in both hands but improving lower extremity strength and function.  He developed some difficulty swallowing on 2/5 and SLP ordered and placed on thickened liquids.   Overnight 2/6, patient with increased difficulty swallowing and was made NPO but also noted to have dysarthria and difficulty moving extremities with numbness.  SLP evaluated and found to be moderate aspiration risk.   Also progressively tachycardic, tachypneic, and now febrile.  Code stroke activated and taken for CT/ CTA head and neck and was given decadron 10mg  once.  NIHSS 18.  CTH was negative for acute findings, and CTA head neck did not reveal any LVO but noted significant prevertebral soft tissue swelling with foci of gas present at the ventral epidural space at C4-5, small collection not excluded. On 2/7, patient with worsening confusion and concern for airway involvement, PCCM consulted and remained in the intensive care unit until he is transferred to the floor on 3/2 and PCCM requested transition to [facility] for medical assistance while patient remains under the neurosurgery service.  Significant Hospital Events: 2/3 ACDF C3-4, C4-5, C5-5, and C6-7 w/ Dr. Annette Stable 2/6 SLP eval for difficulty swallowing 2/7 Code stroke overnight, neg for LVO, CT showing soft tissue swelling.  PCCM consulted for concern of airway  management and AMS.  Went for evacuation of hematoma  2/18 CT Abd/Pelvis: showing bilateral segmental Pes, started on heparin and showing worsening pneumonia, febrile to  103.  PCT rising, 34.6, restarted on abx 2/19 possible aspiration w/ severe hypoxia; intubated; Switching from heparin to angiomax given subtherapeutic levels despite increase in rate. 2/20 Echo shows normal LV systolic function. There was notation of a + McConnell's sign c/w a large pulmonary embolus which is consistent with the finding of bilateral segmental pulmonary emboli noted on CT 07/14/2021. 2/21 rash appreciated on back; stopped cefepim/vanc switched to zosyn; required low dose levo with increase in fentanyl 2/22 remains intubated; unable to extubate to do increase RR and thick secretions 2/23 [name] infusion added 2/24: weaning fentanyl.  2/26 extubated 2/28 tachycardia persists 3/2: Modified barium swallow, continues n.p.o. with core track in place, likely will need PEG tube. 3/6: s/p IR G-tube placement     Assessment & Plan:   Assessment and Plan: * Cervical myelopathy ([place]); severe cervical spinal stenosis with cord compression s/p ACDF [id number] on 2/3, complicated by epidural hematoma s/p evacuation 2/7 Patient initially presented s/p recent fall with progressive upper and lower extremity weakness and tremors, He was found to have critical multilevel cervical spinal stenosis @ C3-3 6 with spinal cord compression and spinal cord signal change and underwent ACDF C3-4, C4-5, C5-5, and C6/7 by neurosurgery Dr. Trenton Gammon on 06/29/2021.  Postoperative leg complicated by epidural hematoma s/p evacuation on 2/7. Continue PT/OT/SLP efforts Further as per neurosurgery recommendation.  Acute respiratory failure with hypoxia (HCC) Likely Multifactorial with significant dysphagia and recurrent aspiration pneumonia events during initial hospitalization following ACDF surgery  with postoperative complications of postoperative  cervical hematoma.  Patient did require ventilatory support in the intensive care unit and was successfully extubated on 07/22/2021.  Patient completed extensive course of empiric antibiotics.   Continue Aspiration precautions Oxygen now weaned off, Now on room air.    Acute pulmonary embolism (HCC) CTA chest showed bilateral segmental PE.  LE [place] negative for DVT.   TTE with normal LV systolic function.  Transitioned  Lovenox to Eliquis. Oxygen now weaned off.  Acute metabolic encephalopathy CT head, MRI brain, EEG, TSH, B12, ammonia unrevealing.   Etiology likely multifactorial with acute respiratory failure sec. to aspiration pneumonia, bilateral pulmonary embolism.   Completed course of antibiotics. Reduced Seroquel dose to 12.5 mg BID on 3/8; continue to wean Reduced clonazepam to 0.5 mg BID on 3/8; continue to wean  Severe sepsis due to recurrent aspiration pneumonia Likely recurrent issue during hospitalization, remains n.p.o. due to continued aspiration risk per SLP.   Completed extensive course of antibiotics while under ICU care. NPO> tube feeds > s/p IR gtube 3/6 Continue aspiration precautions  Sinus tachycardia Multifactorial including sepsis, dehydration, PE, agitation.   Continue metoprolol 100 mg twice daily    Elevated liver enzymes Etiology likely sec. to sepsis from aspiration pneumonia as above.   Acute hepatitis panel negative.  RUQ ultrasound unremarkable. Continue monitor LFTs intermittently  Hyponatremia Etiology likely secondary to dehydration.  Urine sodium low.  Improved with IV fluids.   Sodium now normalized on tube feeds.   Tobacco use disorder Encouraged cessation. Nicotine patch; decreased to 14 mg on 3/8  Physical deconditioning Significant weakness in all extremities as a result of cervical myelopathy and acute illness. Continue PT/OT efforts, will need SNF placement.  Urinary retention Continue bethanechol Continue Foley  catheter, Continue monitor urinary output  Protein-calorie malnutrition, severe ([place]) As evidenced by poor p.o. intake, significant muscle mass and subcu fat loss and significant weight loss (about 16 pounds since this hospitalization). Nutrition Problem: Severe Malnutrition (in the context of social/environmental circumstances) Etiology:  (inadequate energy intake) Signs/Symptoms: mild fat depletion, severe muscle depletion, severe muscle depletion Interventions: Refer to RD note for recommendations  -- Dietitian following, appreciate assistance.   --s/p IR gtube 3/6.  --Continue tube feeds --continue SLP efforts  Hyperglycemia-resolved as of 07/12/2021 Likely due to steroid.  Resolved.  A1c 5.2%.     DVT prophylaxis: SCD's Start: 06/29/21 1231 apixaban (ELIQUIS) tablet 5 mg    Code Status: Full Code Family Communication: No family present.  Disposition Plan:  Level of care: Telemetry Medical Status is: Inpatient  Remains inpatient appropriate because: Will need SNF placement, will be difficult per TOC   Procedures:  ACDF C3-7, neurosurgery Dr. Trenton Gammon 2/3 Hematoma evacuation 2/7 Vascular duplex ultrasound bilateral lower extremities 2/19 TTE 2/20 TTE 3/1 IR G-tube placement 3/6  Antimicrobials:  Vancomycin 2/7 - 2/8; 2/18 - 2/21 Zosyn 2/21 -2/23 Ampicillin 2/7 -2/11 Metronidazole 2/7 - 2/8; 2/17 -2/18 Cefepime 2/18 - 2/21 Perioperative cefazolin  Subjective: Patient was seen and examined at bedside.  Overnight events noted. Patient appears chronically ill looking but lying comfortably in the bed.  Objective: Vitals:   08/02/21 0335 08/02/21 0500 08/02/21 0900 08/02/21 1119  BP: 134/87  111/79 105/84  Pulse:   96 93  Resp: 16  18 20   Temp: 98 F (36.7 C)  98.2 F (36.8 C) 98.1 F (36.7 C)  TempSrc: Axillary   Axillary  SpO2: 100%  100% 99%  Weight:  51.2 kg  Height:        Intake/Output Summary (Last 24 hours) at 08/02/2021 1307 Last data filed at  08/02/2021 0500 Gross per 24 hour  Intake 150 ml  Output 1750 ml  Net -1600 ml   Filed Weights   07/30/21 0423 07/31/21 0344 08/02/21 0500  Weight: 52 kg 49 kg 51.2 kg    Examination:  Physical Exam: GEN: Appears cachectic, chronically ill looking, not in any distress. HEENT: PERRL, EOMI PULM: CTA bilaterally, no wheezing, no crackles, normal respiratory effort. CV: S1-S2 heard, regular rate and rhythm, no murmur. GI: Soft, nontender, nondistended, BS+ G-tube noted with binder in place. GU: Foley catheter noted. MSK: No edema, decreased strength upper and lower extremities.  NEURO: Alert, oriented x1.  Slightly confused but following full commands. PSYCH: Depressed mood, flat affect    Data Reviewed: I have personally reviewed following labs and imaging studies  CBC: Recent Labs  Lab 07/27/21 0348 07/31/21 0113 08/01/21 0536  WBC 13.8* 15.6* 12.5*  HGB 9.3* 10.1* 9.9*  HCT 28.4* 30.6* 30.2*  MCV 96.6 94.2 92.1  PLT 743* 811* [id number]*   Basic Metabolic Panel: Recent Labs  Lab 07/27/21 0348 07/31/21 0113 08/01/21 0536 08/02/21 0102  NA 136 129* 128* 133*  K 3.6 4.2 3.7 3.4*  CL 105 97* 97* 103  CO2 22 22 22  20*  GLUCOSE 112* 92 93 107*  BUN 15 13 11 11   CREATININE 0.51* 0.59* 0.59* 0.55*  CALCIUM 9.4 9.5 9.4 9.3  MG 1.7 1.6*  --  1.6*  PHOS  --  4.0  --  3.7   GFR: Estimated Creatinine Clearance: 71.1 mL/min (A) (by C-G formula based on SCr of 0.55 mg/dL (L)). Liver Function Tests: Recent Labs  Lab 07/27/21 0348 07/31/21 0113 08/02/21 0102  AST 100* 46* 30  ALT 108* 60* 37  ALKPHOS 296* 237* 183*  BILITOT 0.4 0.8 0.6  PROT 7.3 8.1 7.4  ALBUMIN 2.2* 2.5* 2.3*   No results for input(s): LIPASE, AMYLASE in the last 168 hours. No results for input(s): AMMONIA in the last 168 hours.  Coagulation Profile: No results for input(s): INR, PROTIME in the last 168 hours. Cardiac Enzymes: No results for input(s): CKTOTAL, CKMB, CKMBINDEX, TROPONINI in the  last 168 hours. BNP (last 3 results) No results for input(s): PROBNP in the last 8760 hours. HbA1C: No results for input(s): HGBA1C in the last 72 hours. CBG: Recent Labs  Lab 08/01/21 2015 08/01/21 2329 08/02/21 0347 08/02/21 0744 08/02/21 1159  GLUCAP 108* 142* 105* 96 121*   Lipid Profile: No results for input(s): CHOL, HDL, LDLCALC, TRIG, CHOLHDL, LDLDIRECT in the last 72 hours. Thyroid Function Tests: No results for input(s): TSH, T4TOTAL, FREET4, T3FREE, THYROIDAB in the last 72 hours. Anemia Panel: No results for input(s): VITAMINB12, FOLATE, FERRITIN, TIBC, IRON, RETICCTPCT in the last 72 hours. Sepsis Labs: No results for input(s): PROCALCITON, LATICACIDVEN in the last 168 hours.  No results found for this or any previous visit (from the past 240 hour(s)).   Radiology Studies: No results found.  Scheduled Meds:  apixaban  5 mg Per Tube BID   bethanechol  10 mg Per Tube TID   chlorhexidine  15 mL Mouth Rinse BID   Chlorhexidine Gluconate Cloth  6 each Topical Daily   clonazepam  0.5 mg Per Tube BID   feeding supplement (PROSource TF)  45 mL Per Tube TID   folic acid  1 mg Per Tube Daily   mouth rinse  15 mL  Mouth Rinse q12n4p   metoprolol tartrate  100 mg Per Tube BID   multivitamin  15 mL Per Tube Daily   nicotine  14 mg Transdermal Daily   QUEtiapine  12.5 mg Per Tube BID   thiamine  100 mg Per Tube Daily   Continuous Infusions:  sodium chloride Stopped (07/23/21 1122)   dextrose 5 % and 0.9% NaCl 75 mL/hr at 08/02/21 1246   feeding supplement (VITAL 1.5 CAL) 1,000 mL (08/02/21 0746)     LOS: 34 days    Time spent: 50 minutes   Tateanna Bach, MD [facility] Available via Epic secure chat 7am-7pm After these hours, please refer to coverage provider listed on [email or web address] 08/02/2021, 1:07 PM

## 2021-08-03 DIAGNOSIS — G959 Disease of spinal cord, unspecified: Secondary | ICD-10-CM | POA: Diagnosis not present

## 2021-08-03 LAB — GLUCOSE, CAPILLARY
Glucose-Capillary: 101 mg/dL — ABNORMAL HIGH (ref 70–99)
Glucose-Capillary: 109 mg/dL — ABNORMAL HIGH (ref 70–99)
Glucose-Capillary: 110 mg/dL — ABNORMAL HIGH (ref 70–99)
Glucose-Capillary: 116 mg/dL — ABNORMAL HIGH (ref 70–99)
Glucose-Capillary: 117 mg/dL — ABNORMAL HIGH (ref 70–99)
Glucose-Capillary: 99 mg/dL (ref 70–99)

## 2021-08-03 LAB — BASIC METABOLIC PANEL
Anion gap: 7 (ref 5–15)
BUN: 10 mg/dL (ref 6–20)
CO2: 21 mmol/L — ABNORMAL LOW (ref 22–32)
Calcium: 9.3 mg/dL (ref 8.9–10.3)
Chloride: 104 mmol/L (ref 98–111)
Creatinine, Ser: 0.59 mg/dL — ABNORMAL LOW (ref 0.61–1.24)
GFR, Estimated: >60 mL/min (ref >60)
Glucose, Bld: 101 mg/dL — ABNORMAL HIGH (ref 70–99)
Potassium: 4.1 mmol/L (ref 3.5–5.1)
Sodium: 132 mmol/L — ABNORMAL LOW (ref 135–145)

## 2021-08-03 NOTE — Progress Notes (Signed)
No new issues or problems.  Patient denies pain.  Neurologically he is unchanged.  Remains pleasantly confused.  Quadriparesis unchanged.  Neck wound healing well. ? ?Continue supportive efforts.  Awaiting placement. ?

## 2021-08-03 NOTE — Progress Notes (Addendum)
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  He was admitted on 2/3 and underwent ACDF of C3-4, C4-5, C5-5, and C6-7.  Post-operatively he was progressing slowly with residual weakness in both hands but improving lower extremity strength and function.  He developed some difficulty swallowing on 2/5 and SLP ordered and placed on thickened liquids.   Overnight 2/6, patient with increased difficulty swallowing and was made NPO but also noted to have dysarthria and difficulty moving extremities with numbness.  SLP evaluated and found to be moderate aspiration risk.   Also progressively tachycardic, tachypneic, and now febrile.  Code stroke activated and taken for CT/ CTA head and neck and was given decadron 10mg  once.  NIHSS 18.  CTH was negative for acute findings, and CTA head neck did not reveal any LVO but noted significant prevertebral soft tissue swelling with foci of gas present at the ventral epidural space at C4-5, small collection not excluded. On 2/7, patient with worsening confusion and concern for airway involvement, PCCM consulted and remained in the intensive care unit until he is transferred to the floor on 3/2 and PCCM requested transition to [facility] for medical assistance while patient remains under the neurosurgery service.  Significant Hospital Events: 2/3 ACDF C3-4, C4-5, C5-5, and C6-7 w/ Dr. Annette Stable 2/6 SLP eval for difficulty swallowing 2/7 Code stroke overnight, neg for LVO, CT showing soft tissue swelling.  PCCM consulted for concern of airway  management and AMS.  Went for evacuation of hematoma  2/18 CT Abd/Pelvis: showing bilateral segmental Pes, started on heparin and showing worsening pneumonia, febrile to  103.  PCT rising, 34.6, restarted on abx 2/19 possible aspiration w/ severe hypoxia; intubated; Switching from heparin to angiomax given subtherapeutic levels despite increase in rate. 2/20 Echo shows normal LV systolic function. There was notation of a + McConnell's sign c/w a large pulmonary embolus which is consistent with the finding of bilateral segmental pulmonary emboli noted on CT 07/14/2021. 2/21 rash appreciated on back; stopped cefepim/vanc switched to zosyn; required low dose levo with increase in fentanyl 2/22 remains intubated; unable to extubate to do increase RR and thick secretions 2/23 [name] infusion added 2/24: weaning fentanyl.  2/26 extubated 2/28 tachycardia persists 3/2: Modified barium swallow, continues n.p.o. with core track in place, likely will need PEG tube. 3/6: s/p IR G-tube placement Now medically clear awaiting SNF placement.     Assessment & Plan:   * Cervical myelopathy ([place]); severe cervical spinal stenosis with cord compression s/p ACDF [id number] on 2/3, complicated by epidural hematoma s/p evacuation 2/7 Patient initially presented s/p recent fall with progressive upper and lower extremity weakness and tremors, He was found to have critical multilevel cervical spinal stenosis @ C3-3 6 with spinal cord compression and spinal cord signal change and underwent ACDF C3-4, C4-5, C5-5, and C6/7 by neurosurgery Dr. Trenton Gammon on 06/29/2021.  Postoperative leg complicated by epidural hematoma s/p evacuation on 2/7. Continue PT/OT/SLP efforts, Awaiting SNF Further as per neurosurgery recommendation.  Acute respiratory failure with hypoxia (HCC) Likely Multifactorial with significant dysphagia and recurrent aspiration pneumonia events during  initial hospitalization following ACDF surgery with postoperative  complications of postoperative cervical hematoma.  Patient did require ventilatory support in the intensive care unit and was successfully extubated on 07/22/2021.  Patient completed extensive course of empiric antibiotics.   Continue Aspiration precautions Oxygen now weaned off, Now on room air.    Acute pulmonary embolism (HCC) CTA chest showed bilateral segmental PE.  LE [place] negative for DVT.   TTE with normal LV systolic function.  Transitioned  Lovenox to Eliquis. Oxygen now weaned off.  Acute metabolic encephalopathy CT head, MRI brain, EEG, TSH, B12, ammonia unrevealing.   Etiology likely multifactorial with acute respiratory failure sec. to aspiration pneumonia, bilateral pulmonary embolism.   Completed course of antibiotics. Reduced Seroquel dose to 12.5 mg BID on 3/8; continue to wean Reduced clonazepam to 0.5 mg BID on 3/8; continue to wean  Severe sepsis due to recurrent aspiration pneumonia Likely recurrent issue during hospitalization, remains n.p.o. due to continued aspiration risk per SLP.   Completed extensive course of antibiotics while under ICU care. NPO> tube feeds > s/p IR gtube 3/6 Continue aspiration precautions  Sinus tachycardia Multifactorial including sepsis, dehydration, PE, agitation.   Continue metoprolol 100 mg twice daily    Elevated liver enzymes Etiology likely sec. to sepsis from aspiration pneumonia as above.   Acute hepatitis panel negative.  RUQ ultrasound unremarkable. Continue monitor LFTs intermittently  Hyponatremia Etiology likely secondary to dehydration.  Urine sodium low.  Improved with IV fluids.   Sodium now normalized on tube feeds.   Tobacco use disorder Encouraged cessation. Nicotine patch; decreased to 14 mg on 3/8  Physical deconditioning Significant weakness in all extremities as a result of cervical myelopathy and acute illness. Continue PT/OT efforts, will need SNF placement.  Urinary retention Continue  bethanechol Continue Foley catheter, Continue monitor urinary output  Protein-calorie malnutrition, severe ([employer]) As evidenced by poor p.o. intake, significant muscle mass and subcu fat loss and significant weight loss (about 16 pounds since this hospitalization). Nutrition Problem: Severe Malnutrition (in the context of social/environmental circumstances) Etiology:  (inadequate energy intake) Signs/Symptoms: mild fat depletion, severe muscle depletion, severe muscle depletion Interventions: Refer to RD note for recommendations  -- Dietitian following, appreciate assistance.   --s/p IR gtube 3/6.  --Continue tube feeds --continue SLP efforts  Hyperglycemia-resolved as of 07/12/2021 Likely due to steroid.  Resolved.  A1c 5.2%.     DVT prophylaxis: SCD's Start: 06/29/21 1231 apixaban (ELIQUIS) tablet 5 mg    Code Status: Full Code Family Communication: No family present.  Disposition Plan:  Level of care: Telemetry Medical Status is: Inpatient  Remains inpatient appropriate because: Will need SNF placement, will be difficult per TOC   Procedures:  ACDF C3-7, neurosurgery Dr. Trenton Gammon 2/3 Hematoma evacuation 2/7 Vascular duplex ultrasound bilateral lower extremities 2/19 TTE 2/20 TTE 3/1 IR G-tube placement 3/6  Antimicrobials:  Vancomycin 2/7 - 2/8; 2/18 - 2/21 Zosyn 2/21 -2/23 Ampicillin 2/7 -2/11 Metronidazole 2/7 - 2/8; 2/17 -2/18 Cefepime 2/18 - 2/21 Perioperative cefazolin  Subjective: Patient was seen and examined at bedside.  Overnight events noted. Patient appears chronically ill looking, but lying comfortably in the bed. He is alert, awake and following commands.   Objective: Vitals:   08/03/21 0331 08/03/21 0500 08/03/21 0721 08/03/21 1142  BP: 123/87  117/75 110/85  Pulse: 94  88 96  Resp: 20  18 18   Temp: 98.2 F (36.8 C)  98 F (36.7 C) 98.2 F (36.8 C)  TempSrc: Axillary  Axillary Axillary  SpO2: 98%  97% 100%  Weight:  52.8 kg    Height:         Intake/Output Summary (Last 24 hours) at 08/03/2021 1217 Last data filed at 08/03/2021 0524 Gross per 24 hour  Intake 3285.44 ml  Output 800 ml  Net 2485.44 ml   Filed Weights   07/31/21 0344 08/02/21 0500 08/03/21 0500  Weight: 49 kg 51.2 kg 52.8 kg    Examination:  Physical Exam: GEN: Appears chronically ill looking, cachectic, not in any acute distress. HEENT: PERRL, EOMI PULM: CTA bilaterally, no wheezing, no crackles, normal respiratory effort. CV: S1-S2 heard, regular rate and rhythm, no murmur. GI: Soft, non tender, non distended, BS+. G-tube noted with binder in place. GU: Foley catheter noted. MSK: No edema, decreased strength upper and lower extremities.  NEURO: Alert, oriented x1.  Slightly confused but following full commands. PSYCH: Depressed mood, flat affect    Data Reviewed: I have personally reviewed following labs and imaging studies  CBC: Recent Labs  Lab 07/31/21 0113 08/01/21 0536  WBC 15.6* 12.5*  HGB 10.1* 9.9*  HCT 30.6* 30.2*  MCV 94.2 92.1  PLT 811* [id number]*   Basic Metabolic Panel: Recent Labs  Lab 07/31/21 0113 08/01/21 0536 08/02/21 0102 08/03/21 0819  NA 129* 128* 133* 132*  K 4.2 3.7 3.4* 4.1  CL 97* 97* 103 104  CO2 22 22 20* 21*  GLUCOSE 92 93 107* 101*  BUN 13 11 11 10   CREATININE 0.59* 0.59* 0.55* 0.59*  CALCIUM 9.5 9.4 9.3 9.3  MG 1.6*  --  1.6*  --   PHOS 4.0  --  3.7  --    GFR: Estimated Creatinine Clearance: 73.3 mL/min (A) (by C-G formula based on SCr of 0.59 mg/dL (L)). Liver Function Tests: Recent Labs  Lab 07/31/21 0113 08/02/21 0102  AST 46* 30  ALT 60* 37  ALKPHOS 237* 183*  BILITOT 0.8 0.6  PROT 8.1 7.4  ALBUMIN 2.5* 2.3*   No results for input(s): LIPASE, AMYLASE in the last 168 hours. No results for input(s): AMMONIA in the last 168 hours.  Coagulation Profile: No results for input(s): INR, PROTIME in the last 168 hours. Cardiac Enzymes: No results for input(s): CKTOTAL, CKMB, CKMBINDEX,  TROPONINI in the last 168 hours. BNP (last 3 results) No results for input(s): PROBNP in the last 8760 hours. HbA1C: No results for input(s): HGBA1C in the last 72 hours. CBG: Recent Labs  Lab 08/02/21 2016 08/02/21 2352 08/03/21 0440 08/03/21 0726 08/03/21 1145  GLUCAP 123* 122* 99 117* 110*   Lipid Profile: No results for input(s): CHOL, HDL, LDLCALC, TRIG, CHOLHDL, LDLDIRECT in the last 72 hours. Thyroid Function Tests: No results for input(s): TSH, T4TOTAL, FREET4, T3FREE, THYROIDAB in the last 72 hours. Anemia Panel: No results for input(s): VITAMINB12, FOLATE, FERRITIN, TIBC, IRON, RETICCTPCT in the last 72 hours. Sepsis Labs: No results for input(s): PROCALCITON, LATICACIDVEN in the last 168 hours.  No results found for this or any previous visit (from the past 240 hour(s)).   Radiology Studies: No results found.  Scheduled Meds:  apixaban  5 mg Per Tube BID   bethanechol  10 mg Per Tube TID   chlorhexidine  15 mL Mouth Rinse BID   Chlorhexidine Gluconate Cloth  6 each Topical Daily   clonazepam  0.5 mg Per Tube BID   feeding supplement (PROSource TF)  45 mL Per Tube TID   folic acid  1 mg Per Tube Daily   mouth rinse  15 mL Mouth Rinse q12n4p   metoprolol tartrate  100 mg Per Tube BID   multivitamin  15 mL Per Tube Daily   nicotine  14 mg Transdermal Daily   QUEtiapine  12.5 mg Per Tube BID   thiamine  100 mg Per Tube Daily   Continuous Infusions:  sodium chloride Stopped (07/23/21 1122)   dextrose 5 % and 0.9% NaCl 75 mL/hr at 08/03/21 0524   feeding supplement (VITAL 1.5 CAL) 1,000 mL (08/03/21 0401)     LOS: 35 days    Time spent: 35 minutes   Shalah Estelle, MD [facility] Available via Epic secure chat 7am-7pm After these hours, please refer to coverage provider listed on [email or web address] 08/03/2021, 12:17 PM

## 2021-08-03 NOTE — Progress Notes (Signed)
Occupational Therapy Treatment ?Patient Details ?Name: Brian [name] ?MRN: [id number] ?DOB: 10-22-1960 ?Today's Date: 08/03/2021 ? ? ?History of present illness Pt is a 61 y/o male admitted 06/29/21 following C3-7 ACDF after progressive cervical myelopathy symptoms including ataxia, bil UE and LE shaking and weakness, and loss of function in his hands with subsequent fall. Pt initially had improvement in neurological symptoms, but postoperative course was complicated by dysphasia and ataxia secondary to and epidural hematoma with evacuation of hematoma on 2/7. Course complicated by delirium secondary to encephalopathy, sepsis secondary to aspiration PNA, reintubated 2/19-2/26 for airway protection, bilat segmental PEs 2/18. PEG placed 3/6. PMHx includes tobacco and EtOH use disorder. ?  ?OT comments ? Pt. Seen for skilled OT treatment session.  Making great gains with use of B UES.  Able to complete B UE exercises today with AROM while supine.  Also holding object in each hand and bringing to mouth multiple times without dropping.  Pt. Was also able to assist with grooming task bed level also.  Agree with current d/c recommendations.    ? ?Recommendations for follow up therapy are one component of a multi-disciplinary discharge planning process, led by the attending physician.  Recommendations may be updated based on patient status, additional functional criteria and insurance authorization. ?   ?Follow Up Recommendations ? Skilled nursing-short term rehab (<3 hours/day)  ?  ?Assistance Recommended at Discharge Frequent or constant Supervision/Assistance  ?Patient can return home with the following ? Two people to help with walking and/or transfers;Direct supervision/assist for medications management;Assist for transportation;Help with stairs or ramp for entrance;Assistance with feeding;Two people to help with bathing/dressing/bathroom;Assistance with cooking/housework;Direct supervision/assist for financial  management ?  ?Equipment Recommendations ? Wheelchair (measurements OT);Wheelchair cushion (measurements OT);Hospital bed;BSC/3in1;Tub/shower seat  ?  ?Recommendations for Other Services Rehab consult ? ?  ?Precautions / Restrictions Precautions ?Precautions: Fall ?Required Braces or Orthoses: Cervical Brace ?Cervical Brace: Soft collar;For comfort;Other (comment)  ? ? ?  ? ?Mobility Bed Mobility ?  ?  ?  ?  ?  ?  ?  ?  ?  ? ?Transfers ?  ?  ?  ?  ?  ?  ?  ?  ?  ?  ?  ?  ?Balance   ?  ?  ?  ?  ?  ?  ?  ?  ?  ?  ?  ?  ?  ?  ?  ?  ?  ?  ?   ? ?ADL either performed or assessed with clinical judgement  ? ?ADL Overall ADL's : Needs assistance/impaired ?  ?  ?Grooming: Bed level;Moderate assistance;Wash/dry face ?Grooming Details (indicate cue type and reason): able to bring wash cloth to lips and wipe mouth, assistance to reach hand higher to cheeks and eyes ?  ?  ?  ?  ?  ?  ?  ?  ?  ?  ?  ?  ?  ?  ?  ?General ADL Comments: able to hold 2 oz hand lotion bottle in each hand without dropping while bringing to mouth no assistance to guide each extremity! ?  ? ?Extremity/Trunk Assessment   ?  ?  ?  ?  ?  ? ?Vision   ?  ?  ?Perception   ?  ?Praxis   ?  ? ?Cognition Arousal/Alertness: Lethargic ?Behavior During Therapy: Flat affect ?  ?  ?  ?  ?  ?  ?  ?  ?  ?  ?  ?  ?  ?  ?  ?  ?  ?  ?  ?  ?   ?  Exercises General Exercises - Upper Extremity ?Elbow Flexion: AROM, Both, 10 reps, Supine ?Elbow Extension: AROM, Both, 10 reps, Supine ?Wrist Flexion: AROM, Both, 10 reps, Supine ?Wrist Extension: Both, 10 reps, Supine, AROM ?Digit Composite Flexion: Both, 10 reps, Supine, AROM ?Composite Extension: Both, 10 reps, Supine, AROM ? ?  ?Shoulder Instructions   ? ? ?  ?General Comments    ? ? ?Pertinent Vitals/ Pain       Pain Assessment ?Pain Assessment: No/denies pain ? ?Home Living   ?  ?  ?  ?  ?  ?  ?  ?  ?  ?  ?  ?  ?  ?  ?  ?  ?  ?  ? ?  ?Prior Functioning/Environment    ?  ?  ?  ?   ? ?Frequency ? Min 2X/week  ? ? ? ? ?  ?Progress  Toward Goals ? ?OT Goals(current goals can now be found in the care plan section) ? Progress towards OT goals: Progressing toward goals ? ?   ?Plan Discharge plan remains appropriate   ? ?Co-evaluation ? ? ?   ?  ?  ?  ?  ? ?  ?AM-PAC OT "6 Clicks" Daily Activity     ?Outcome Measure ? ? Help from another person eating meals?: Total ?Help from another person taking care of personal grooming?: Total ?Help from another person toileting, which includes using toliet, bedpan, or urinal?: Total ?Help from another person bathing (including washing, rinsing, drying)?: Total ?Help from another person to put on and taking off regular upper body clothing?: Total ?Help from another person to put on and taking off regular lower body clothing?: Total ?6 Click Score: 6 ? ?  ?End of Session   ? ?OT Visit Diagnosis: Other abnormalities of gait and mobility (R26.89);Muscle weakness (generalized) (M62.81);Feeding difficulties (R63.3) ?  ?Activity Tolerance Patient tolerated treatment well ?  ?Patient Left in bed;with call bell/phone within reach;with bed alarm set ?  ?Nurse Communication   ?  ? ?   ? ?Time: 2703-5009 ?OT Time Calculation (min): 10 min ? ?Charges: OT General Charges ?$OT Visit: 1 Visit ?OT Treatments ?$Therapeutic Exercise: 8-22 mins ? ?Boneta Lucks, COTA/L ?Acute Rehabilitation ?[phone]  ? ?Salvadore Oxford ?08/03/2021, 10:44 AM ?

## 2021-08-03 NOTE — Progress Notes (Signed)
Physical Therapy Treatment ?Patient Details ?Name: Brian [name] ?MRN: [id number] ?DOB: [birth year] ?Today's Date: 08/03/2021 ? ? ?History of Present Illness Pt is a 61 y/o male admitted 06/29/21 following C3-7 ACDF after progressive cervical myelopathy symptoms including ataxia, bil UE and LE shaking and weakness, and loss of function in his hands with subsequent fall. Pt initially had improvement in neurological symptoms, but postoperative course was complicated by dysphasia and ataxia secondary to and epidural hematoma with evacuation of hematoma on 2/7. Course complicated by delirium secondary to encephalopathy, sepsis secondary to aspiration PNA, reintubated 2/19-2/26 for airway protection, bilat segmental PEs 2/18. PEG placed 3/6. PMHx includes tobacco and EtOH use disorder. ? ?  ?PT Comments  ? ? Pt with some slight improvement in sitting balance but otherwise unchanged. Updated goals.    ?Recommendations for follow up therapy are one component of a multi-disciplinary discharge planning process, led by the attending physician.  Recommendations may be updated based on patient status, additional functional criteria and insurance authorization. ? ?Follow Up Recommendations ? Skilled nursing-short term rehab (<3 hours/day) ?  ?  ?Assistance Recommended at Discharge Frequent or constant Supervision/Assistance  ?Patient can return home with the following Two people to help with walking and/or transfers;Two people to help with bathing/dressing/bathroom;Direct supervision/assist for medications management;Direct supervision/assist for financial management;Assist for transportation;Assistance with feeding;Assistance with cooking/housework;Help with stairs or ramp for entrance ?  ?Equipment Recommendations ? Wheelchair (measurements PT);Hospital bed;Wheelchair cushion (measurements PT);BSC/3in1;Other (comment) (hoyer lift)  ?  ?Recommendations for Other Services   ? ? ?  ?Precautions / Restrictions  Precautions ?Precautions: Fall ?Required Braces or Orthoses: Cervical Brace ?Cervical Brace: Soft collar;For comfort;Other (comment)  ?  ? ?Mobility ? Bed Mobility ?Overal bed mobility: Needs Assistance ?Bed Mobility: Supine to Sit, Sit to Sidelying ?  ?  ?Supine to sit: +2 for physical assistance, Total assist ?  ?Sit to sidelying: +2 for physical assistance, Total assist ?General bed mobility comments: Assist with all aspects. ?  ? ?Transfers ?  ?  ?  ?  ?  ?  ?  ?  ?  ?  ?  ? ?Ambulation/Gait ?  ?  ?  ?  ?  ?  ?  ?  ? ? ?Stairs ?  ?  ?  ?  ?  ? ? ?Wheelchair Mobility ?  ? ?Modified Rankin (Stroke Patients Only) ?  ? ? ?  ?Balance Overall balance assessment: Needs assistance ?Sitting-balance support: Single extremity supported ?Sitting balance-Leahy Scale: Poor ?Sitting balance - Comments: Sat EOB x 10 minutes with min guard to  mod assist. Initially mod assist with improvement to min to min guard ?Postural control: Posterior lean ?  ?  ?  ?  ?  ?  ?  ?  ?  ?  ?  ?  ?  ?  ?  ? ?  ?Cognition Arousal/Alertness: Awake/alert ?Behavior During Therapy: Flat affect ?Overall Cognitive Status: Impaired/Different from baseline ?Area of Impairment: Orientation, Attention, Memory, Following commands, Safety/judgement, Awareness, Problem solving ?  ?  ?  ?  ?  ?  ?  ?  ?Orientation Level: Disoriented to, Time, Situation, Place ?Current Attention Level: Sustained ?Memory: Decreased short-term memory, Decreased recall of precautions ?Following Commands: Follows one step commands inconsistently ?Safety/Judgement: Decreased awareness of safety, Decreased awareness of deficits ?Awareness: Intellectual ?Problem Solving: Difficulty sequencing, Requires verbal cues, Requires tactile cues, Slow processing, Decreased initiation ?General Comments: Following ~50% of commands ?  ?  ? ?  ?Exercises   ? ?  ?  General Comments   ?  ?  ? ?Pertinent Vitals/Pain Pain Assessment ?Pain Assessment: Faces ?Faces Pain Scale: Hurts little more ?Pain  Location: "all over" during mobility ?Pain Descriptors / Indicators: Grimacing  ? ? ?Home Living   ?  ?  ?  ?  ?  ?  ?  ?  ?  ?   ?  ?Prior Function    ?  ?  ?   ? ?PT Goals (current goals can now be found in the care plan section) Progress towards PT goals: Not progressing toward goals - comment ? ?  ?Frequency ? ? ? Min 2X/week ? ? ? ?  ?PT Plan Current plan remains appropriate  ? ? ?Co-evaluation   ?  ?  ?  ?  ? ?  ?AM-PAC PT "6 Clicks" Mobility   ?Outcome Measure ? Help needed turning from your back to your side while in a flat bed without using bedrails?: Total ?Help needed moving from lying on your back to sitting on the side of a flat bed without using bedrails?: Total ?Help needed moving to and from a bed to a chair (including a wheelchair)?: Total ?Help needed standing up from a chair using your arms (e.g., wheelchair or bedside chair)?: Total ?Help needed to walk in hospital room?: Total ?Help needed climbing 3-5 steps with a railing? : Total ?6 Click Score: 6 ? ?  ?End of Session   ?Activity Tolerance: Patient tolerated treatment well ?Patient left: in bed;with call bell/phone within reach;with bed alarm set ?  ?PT Visit Diagnosis: Unsteadiness on feet (R26.81);Muscle weakness (generalized) (M62.81);Other symptoms and signs involving the nervous system (R29.898);Difficulty in walking, not elsewhere classified (R26.2) ?  ? ? ?Time: 0388-8280 ?PT Time Calculation (min) (ACUTE ONLY): 17 min ? ?Charges:  $Therapeutic Activity: 8-22 mins          ?          ? ?[facility] ?Acute Rehabilitation Services ?Pager [phone] ?Office [phone] ? ? ? ?Angelina Ok [facility] ?08/03/2021, 10:36 AM ? ?

## 2021-08-04 ENCOUNTER — Encounter (HOSPITAL_COMMUNITY): Payer: Self-pay | Admitting: Neurosurgery

## 2021-08-04 DIAGNOSIS — G959 Disease of spinal cord, unspecified: Secondary | ICD-10-CM | POA: Diagnosis not present

## 2021-08-04 LAB — CBC
HCT: 29.5 % — ABNORMAL LOW (ref 39.0–52.0)
Hemoglobin: 9.9 g/dL — ABNORMAL LOW (ref 13.0–17.0)
MCH: 31.3 pg (ref 26.0–34.0)
MCHC: 33.6 g/dL (ref 30.0–36.0)
MCV: 93.4 fL (ref 80.0–100.0)
Platelets: 639 10*3/uL — ABNORMAL HIGH (ref 150–400)
RBC: 3.16 MIL/uL — ABNORMAL LOW (ref 4.22–5.81)
RDW: 14.7 % (ref 11.5–15.5)
WBC: 9 10*3/uL (ref 4.0–10.5)
nRBC: 0 % (ref 0.0–0.2)

## 2021-08-04 LAB — GLUCOSE, CAPILLARY
Glucose-Capillary: 101 mg/dL — ABNORMAL HIGH (ref 70–99)
Glucose-Capillary: 115 mg/dL — ABNORMAL HIGH (ref 70–99)
Glucose-Capillary: 111 mg/dL — ABNORMAL HIGH (ref 70–99)
Glucose-Capillary: 111 mg/dL — ABNORMAL HIGH (ref 70–99)
Glucose-Capillary: 116 mg/dL — ABNORMAL HIGH (ref 70–99)
Glucose-Capillary: 126 mg/dL — ABNORMAL HIGH (ref 70–99)

## 2021-08-04 NOTE — Progress Notes (Signed)
Pt refused IV - explained to patient that some medications are ordered by Vein & that there is a DR order for IV - Pt said' You come closer to the bed and I might hurt you" RN aware ?

## 2021-08-04 NOTE — Progress Notes (Signed)
Patient remained stable.  He is in no distress.  Denies pain.  No significant pulmonary issues. ? ?Afebrile mildly tachycardic.  Blood pressure and respiratory rate good.  Saturating well.  Neurologically about the same.  Pleasantly confused.  Follows commands bilaterally.  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  Wound clean and dry.  Chest and abdomen benign.  Tolerating tube feeds well. ? ?Continue supportive care as patient slowly convalesces following surgery for severe cervical myelopathy with multiple postoperative complications. ?

## 2021-08-04 NOTE — Progress Notes (Signed)
Progress Note    Brian [name]  [id number] DOB: [birth year]  DOA: 06/29/2021 PCP: Pcp, No      Brief Narrative:    Medical records reviewed and are as summarized below:   Brian [name] is a 61 year old male with past medical history significant for tobacco and EtOH use disorder who was admitted by neurosurgery for cervical myelopathy due to critical multilevel cervical spinal canal stenosis C3-6 with spinal cord compression and spinal cord signal change after recent fall with progressive upper and lower extremity weakness and tremors.  He was admitted on 2/3 and underwent ACDF of C3-4, C4-5, C5-5, and C6-7.  Post-operatively he was progressing slowly with residual weakness in both hands but improving lower extremity strength and function.  He developed some difficulty swallowing on 2/5 and SLP ordered and placed on thickened liquids.   Overnight 2/6, patient with increased difficulty swallowing and was made NPO but also noted to have dysarthria and difficulty moving extremities with numbness.  SLP evaluated and found to be moderate aspiration risk.   Also progressively tachycardic, tachypneic, and now febrile.  Code stroke activated and taken for CT/ CTA head and neck and was given decadron 10mg  once.  NIHSS 18.  CTH was negative for acute findings, and CTA head neck did not reveal any LVO but noted significant prevertebral soft tissue swelling with foci of gas present at the ventral epidural space at C4-5, small collection not excluded. On 2/7, patient with worsening confusion and concern for airway involvement, PCCM consulted and remained in the intensive care unit until he is transferred to the floor on 3/2 and PCCM requested transition to [facility] for medical assistance while patient remains under the neurosurgery service.  Significant Hospital Events: 2/3 ACDF C3-4, C4-5, C5-5, and C6-7 w/ Dr. Annette Stable 2/6 SLP eval for difficulty swallowing 2/7 Code stroke overnight, neg for LVO, CT  showing soft tissue swelling.  PCCM consulted for concern of airway management and AMS.  Went for evacuation of hematoma  2/18 CT Abd/Pelvis: showing bilateral segmental Pes, started on heparin and showing worsening pneumonia, febrile to  103.  PCT rising, 34.6, restarted on abx 2/19 possible aspiration w/ severe hypoxia; intubated; Switching from heparin to angiomax given subtherapeutic levels despite increase in rate. 2/20 Echo shows normal LV systolic function. There was notation of a + McConnell's sign c/w a large pulmonary embolus which is consistent with the finding of bilateral segmental pulmonary emboli noted on CT 07/14/2021. 2/21 rash appreciated on back; stopped cefepim/vanc switched to zosyn; required low dose levo with increase in fentanyl 2/22 remains intubated; unable to extubate to do increase RR and thick secretions 2/23 [name] infusion added 2/24: weaning fentanyl.  2/26 extubated 2/28 tachycardia persists 3/2: Modified barium swallow, continues n.p.o. with core track in place, likely will need PEG tube. 3/6: s/p IR G-tube placement Now medically clear awaiting SNF placement.       Assessment/Plan:   Principal Problem:   Cervical myelopathy ([place]); severe cervical spinal stenosis with cord compression s/p ACDF [id number] on 2/3, complicated by epidural hematoma s/p evacuation 2/7 Active Problems:   Acute respiratory failure with hypoxia (HCC)   Acute pulmonary embolism (HCC)   Acute metabolic encephalopathy   Severe sepsis due to recurrent aspiration pneumonia   Sinus tachycardia   Elevated liver enzymes   Hyponatremia   Tobacco use disorder   Physical deconditioning   Urinary retention   Protein-calorie malnutrition, severe (HCC)   Nutrition Problem: Severe Malnutrition Etiology: social /  environmental circumstances  Signs/Symptoms: severe muscle depletion, severe fat depletion   Body mass index is 16.7 kg/m.  Diet Order             Diet NPO time  specified Except for: Ice Chips  Diet effective now                        Assessment and Plan: * Cervical myelopathy (HCC); severe cervical spinal stenosis with cord compression s/p ACDF C3-7 on 2/3, complicated by epidural hematoma s/p evacuation 2/7  Awaiting placement to SNF. Follow up with neurosurgeon  Acute respiratory failure with hypoxia (HCC), aspiration pneumonia  Improved and toerating room air.  Patient did require ventilatory support in the intensive care unit and was successfully extubated on 07/22/2021.  Patient completed extensive course of empiric antibiotics.   Continue Aspiration precautions   Acute pulmonary embolism (HCC) Continue Eliquis.  CTA chest showed bilateral segmental PE.  LE [place] negative for DVT.  TTE with normal LV systolic function.    Acute metabolic encephalopathy CT head, MRI brain, EEG, TSH, B12, ammonia unrevealing.   Etiology likely multifactorial with acute respiratory failure sec. to aspiration pneumonia, bilateral pulmonary embolism.    Reduced Seroquel dose to 12.5 mg BID on 3/8; continue to wean Reduced clonazepam to 0.5 mg BID on 3/8; continue to wean  Severe sepsis due to recurrent aspiration pneumonia Likely recurrent issue during hospitalization, remains n.p.o. due to continued aspiration risk per SLP.   Completed extensive course of antibiotics while under ICU care. NPO> tube feeds > s/p IR gtube 3/6 Continue aspiration precautions  Sinus tachycardia Multifactorial including sepsis, dehydration, PE, agitation.   Continue metoprolol 100 mg twice daily    Elevated liver enzymes Etiology likely sec. to sepsis from aspiration pneumonia as above.   Acute hepatitis panel negative.  RUQ ultrasound unremarkable. Continue monitor LFTs intermittently  Hyponatremia Improved   Tobacco use disorder Encouraged cessation. Nicotine patch; decreased to 14 mg on 3/8  Physical deconditioning Significant weakness in all  extremities as a result of cervical myelopathy and acute illness. Continue PT/OT efforts, will need SNF placement.  Urinary retention Continue bethanechol Continue Foley catheter, Continue monitor urinary output  Protein-calorie malnutrition, severe (HCC) As evidenced by poor p.o. intake, significant muscle mass and subcu fat loss and significant weight loss (about 16 pounds since this hospitalization). Nutrition Problem: Severe Malnutrition (in the context of social/environmental circumstances) Etiology:  (inadequate energy intake) Signs/Symptoms: mild fat depletion, severe muscle depletion, severe muscle depletion Interventions: Refer to RD note for recommendations  -- Dietitian following, appreciate assistance.   --s/p IR gtube 3/6.  --Continue tube feeds --continue SLP efforts  Hyperglycemia-resolved as of 07/12/2021 Likely due to steroid.  Resolved.  A1c 5.2%.            Consultants: Neurosurgery, neurologist, intensivist  Procedures:  ACDF C3-7, neurosurgery Dr. Dutch Quint 2/3 Hematoma evacuation 2/7 Vascular duplex ultrasound bilateral lower extremities 2/19 TTE 2/20 TTE 3/1 IR G-tube placement 3/6    Medications:    apixaban  5 mg Per Tube BID   bethanechol  10 mg Per Tube TID   chlorhexidine  15 mL Mouth Rinse BID   Chlorhexidine Gluconate Cloth  6 each Topical Daily   clonazepam  0.5 mg Per Tube BID   feeding supplement (PROSource TF)  45 mL Per Tube TID   folic acid  1 mg Per Tube Daily   mouth rinse  15 mL Mouth Rinse q12n4p  metoprolol tartrate  100 mg Per Tube BID   multivitamin  15 mL Per Tube Daily   nicotine  14 mg Transdermal Daily   QUEtiapine  12.5 mg Per Tube BID   thiamine  100 mg Per Tube Daily   Continuous Infusions:  sodium chloride Stopped (07/23/21 1122)   dextrose 5 % and 0.9% NaCl 75 mL/hr at 08/04/21 1000   feeding supplement (VITAL 1.5 CAL) 1,000 mL (08/03/21 0401)     Anti-infectives (From admission, onward)    Start      Dose/Rate Route Frequency Ordered Stop   07/30/21 0937  ceFAZolin (ANCEF) 2-4 GM/100ML-% IVPB       Note to Pharmacy: Cristy Friedlanderusterman, Kyle E: cabinet override      07/30/21 0937 07/30/21 2144   07/30/21 0937  ceFAZolin (ANCEF) IVPB 2g/100 mL premix        over 30 Minutes Intravenous Continuous PRN 07/30/21 0948 07/30/21 0937   07/17/21 1300  piperacillin-tazobactam (ZOSYN) IVPB 3.375 g        3.375 g 12.5 mL/hr over 240 Minutes Intravenous Every 8 hours 07/17/21 1200 07/19/21 0928   07/15/21 0800  ceFEPIme (MAXIPIME) 2 g in sodium chloride 0.9 % 100 mL IVPB  Status:  Discontinued        2 g 200 mL/hr over 30 Minutes Intravenous Every 8 hours 07/15/21 0723 07/17/21 1137   07/14/21 2200  ceFEPIme (MAXIPIME) 2 g in sodium chloride 0.9 % 100 mL IVPB  Status:  Discontinued        2 g 200 mL/hr over 30 Minutes Intravenous Every 12 hours 07/14/21 0856 07/15/21 0723   07/14/21 1000  metroNIDAZOLE (FLAGYL) IVPB 500 mg  Status:  Discontinued        500 mg 100 mL/hr over 60 Minutes Intravenous Every 12 hours 07/14/21 0806 07/15/21 1450   07/14/21 1000  vancomycin (VANCOREADY) IVPB 1250 mg/250 mL  Status:  Discontinued        1,250 mg 166.7 mL/hr over 90 Minutes Intravenous Every 24 hours 07/14/21 0852 07/17/21 1137   07/14/21 0900  vancomycin (VANCOCIN) IVPB 1000 mg/200 mL premix  Status:  Discontinued        1,000 mg 200 mL/hr over 60 Minutes Intravenous  Once 07/14/21 0806 07/14/21 0852   07/14/21 0830  ceFEPIme (MAXIPIME) 2 g in sodium chloride 0.9 % 100 mL IVPB        2 g 200 mL/hr over 30 Minutes Intravenous  Once 07/14/21 0806 07/14/21 1217   07/04/21 1500  Ampicillin-Sulbactam (UNASYN) 3 g in sodium chloride 0.9 % 100 mL IVPB  Status:  Discontinued        3 g 200 mL/hr over 30 Minutes Intravenous Every 8 hours 07/04/21 0806 07/07/21 1009   07/04/21 0100  vancomycin (VANCOCIN) IVPB 1000 mg/200 mL premix  Status:  Discontinued        1,000 mg 200 mL/hr over 60 Minutes Intravenous Every 12 hours  07/03/21 1105 07/04/21 0750   07/03/21 1522  vancomycin (VANCOCIN) powder  Status:  Discontinued          As needed 07/03/21 1524 07/03/21 1549   07/03/21 1200  metroNIDAZOLE (FLAGYL) IVPB 500 mg  Status:  Discontinued        500 mg 100 mL/hr over 60 Minutes Intravenous 2 times daily 07/03/21 1100 07/03/21 1107   07/03/21 1200  ceFEPIme (MAXIPIME) 2 g in sodium chloride 0.9 % 100 mL IVPB  Status:  Discontinued        2  g 200 mL/hr over 30 Minutes Intravenous Every 8 hours 07/03/21 1102 07/04/21 0750   07/03/21 1200  metroNIDAZOLE (FLAGYL) IVPB 500 mg  Status:  Discontinued        500 mg 100 mL/hr over 60 Minutes Intravenous Every 8 hours 07/03/21 1107 07/04/21 0750   07/03/21 1145  vancomycin (VANCOREADY) IVPB 1500 mg/300 mL        1,500 mg 150 mL/hr over 120 Minutes Intravenous  Once 07/03/21 1100 07/03/21 1607   07/03/21 1045  Ampicillin-Sulbactam (UNASYN) 3 g in sodium chloride 0.9 % 100 mL IVPB  Status:  Discontinued        3 g 200 mL/hr over 30 Minutes Intravenous Every 6 hours 07/03/21 0957 07/03/21 1100   06/29/21 1215  ceFAZolin (ANCEF) IVPB 1 g/50 mL premix        1 g 100 mL/hr over 30 Minutes Intravenous Every 8 hours 06/29/21 1205 06/29/21 1932   06/29/21 0800  ceFAZolin (ANCEF) IVPB 2g/100 mL premix        2 g 200 mL/hr over 30 Minutes Intravenous On call to O.R. 06/29/21 1610 06/29/21 0806   06/29/21 0751  ceFAZolin (ANCEF) 2-4 GM/100ML-% IVPB       Note to Pharmacy: Clydene Laming N: cabinet override      06/29/21 0751 06/29/21 0819              Family Communication/Anticipated D/C date and plan/Code Status   DVT prophylaxis: SCD's Start: 06/29/21 1231 apixaban (ELIQUIS) tablet 5 mg     Code Status: Full Code  Family Communication: None Disposition Plan: Plan to discharge to SNF   Status is: Inpatient  Remains inpatient appropriate because: Awaiting placement to SNF         Subjective:   Interval events noted.  He is intermittently confused  and unable to provide adequate history  Objective:    Vitals:   08/03/21 2335 08/04/21 0330 08/04/21 0751 08/04/21 1121  BP: 137/84 137/88 138/87 130/90  Pulse: 89  (!) 107 94  Resp: Temp: 98.3 F (36.8 C) 98.9 F (37.2 C) 97.9 F (36.6 C) 98.9 F (37.2 C)  TempSrc: Axillary Oral Oral Oral  SpO2: 100% 100% 100%   Weight:      Height:       No data found.   Intake/Output Summary (Last 24 hours) at 08/04/2021 1345 Last data filed at 08/04/2021 1000 Gross per 24 hour  Intake 3671.92 ml  Output 3800 ml  Net -128.08 ml   Filed Weights   07/31/21 0344 08/02/21 0500 08/03/21 0500  Weight: 49 kg 51.2 kg 52.8 kg    Exam:  GEN: NAD SKIN: Warm and dry EYES: No pallor or icterus ENT: MMM CV: RRR PULM: CTA B ABD: soft, ND, NT, +BS CNS: AAO x 1 (person), non focal EXT: No edema or tenderness        Data Reviewed:   I have personally reviewed following labs and imaging studies:  Labs: Labs show the following:   Basic Metabolic Panel: Recent Labs  Lab 07/31/21 0113 08/01/21 0536 08/02/21 0102 08/03/21 0819  NA 129* 128* 133* 132*  K 4.2 3.7 3.4* 4.1  CL 97* 97* 103 104  CO2 22 22 20* 21*  GLUCOSE 92 93 107* 101*  BUN CREATININE 0.59* 0.59* 0.55* 0.59*  CALCIUM 9.5 9.4 9.3 9.3  MG 1.6*  --  1.6*  --   PHOS 4.0  --  3.7  --  GFR Estimated Creatinine Clearance: 73.3 mL/min (A) (by C-G formula based on SCr of 0.59 mg/dL (L)). Liver Function Tests: Recent Labs  Lab 07/31/21 0113 08/02/21 0102  AST 46* 30  ALT 60* 37  ALKPHOS 237* 183*  BILITOT 0.8 0.6  PROT 8.1 7.4  ALBUMIN 2.5* 2.3*   No results for input(s): LIPASE, AMYLASE in the last 168 hours. No results for input(s): AMMONIA in the last 168 hours. Coagulation profile No results for input(s): INR, PROTIME in the last 168 hours.  CBC: Recent Labs  Lab 07/31/21 0113 08/01/21 0536 08/04/21 0448  WBC 15.6* 12.5* 9.0  HGB 10.1* 9.9* 9.9*  HCT 30.6* 30.2* 29.5*   MCV 94.2 92.1 93.4  PLT 811* 754* 639*   Cardiac Enzymes: No results for input(s): CKTOTAL, CKMB, CKMBINDEX, TROPONINI in the last 168 hours. BNP (last 3 results) No results for input(s): PROBNP in the last 8760 hours. CBG: Recent Labs  Lab 08/03/21 1940 08/03/21 2351 08/04/21 0330 08/04/21 0755 08/04/21 1146  GLUCAP 116* 101* 101* 115* 111*   D-Dimer: No results for input(s): DDIMER in the last 72 hours. Hgb A1c: No results for input(s): HGBA1C in the last 72 hours. Lipid Profile: No results for input(s): CHOL, HDL, LDLCALC, TRIG, CHOLHDL, LDLDIRECT in the last 72 hours. Thyroid function studies: No results for input(s): TSH, T4TOTAL, T3FREE, THYROIDAB in the last 72 hours.  Invalid input(s): FREET3 Anemia work up: No results for input(s): VITAMINB12, FOLATE, FERRITIN, TIBC, IRON, RETICCTPCT in the last 72 hours. Sepsis Labs: Recent Labs  Lab 07/31/21 0113 08/01/21 0536 08/04/21 0448  WBC 15.6* 12.5* 9.0    Microbiology No results found for this or any previous visit (from the past 240 hour(s)).  Procedures and diagnostic studies:  No results found.             LOS: 36 days   Shyne Resch  [employer] loss adjuster on [email or web address]. If 7PM-7AM, please contact night-coverage at [email or web address]     08/04/2021, 1:45 PM

## 2021-08-05 DIAGNOSIS — G959 Disease of spinal cord, unspecified: Secondary | ICD-10-CM | POA: Diagnosis not present

## 2021-08-05 DIAGNOSIS — G9341 Metabolic encephalopathy: Secondary | ICD-10-CM | POA: Diagnosis not present

## 2021-08-05 LAB — GLUCOSE, CAPILLARY
Glucose-Capillary: 101 mg/dL — ABNORMAL HIGH (ref 70–99)
Glucose-Capillary: 100 mg/dL — ABNORMAL HIGH (ref 70–99)
Glucose-Capillary: 105 mg/dL — ABNORMAL HIGH (ref 70–99)
Glucose-Capillary: 118 mg/dL — ABNORMAL HIGH (ref 70–99)
Glucose-Capillary: 132 mg/dL — ABNORMAL HIGH (ref 70–99)
Glucose-Capillary: 94 mg/dL (ref 70–99)

## 2021-08-05 NOTE — Progress Notes (Signed)
Progress Note    Brian [name]  [id number] DOB: [birth year]  DOA: 06/29/2021 PCP: Pcp, No      Brief Narrative:    Medical records reviewed and are as summarized below:   Brian [name] is a 61 year old male with past medical history significant for tobacco and EtOH use disorder who was admitted by neurosurgery for cervical myelopathy due to critical multilevel cervical spinal canal stenosis C3-6 with spinal cord compression and spinal cord signal change after recent fall with progressive upper and lower extremity weakness and tremors.  He was admitted on 2/3 and underwent ACDF of C3-4, C4-5, C5-5, and C6-7.  Post-operatively he was progressing slowly with residual weakness in both hands but improving lower extremity strength and function.  He developed some difficulty swallowing on 2/5 and SLP ordered and placed on thickened liquids.   Overnight 2/6, patient with increased difficulty swallowing and was made NPO but also noted to have dysarthria and difficulty moving extremities with numbness.  SLP evaluated and found to be moderate aspiration risk.   Also progressively tachycardic, tachypneic, and now febrile.  Code stroke activated and taken for CT/ CTA head and neck and was given decadron 10mg  once.  NIHSS 18.  CTH was negative for acute findings, and CTA head neck did not reveal any LVO but noted significant prevertebral soft tissue swelling with foci of gas present at the ventral epidural space at C4-5, small collection not excluded. On 2/7, patient with worsening confusion and concern for airway involvement, PCCM consulted and remained in the intensive care unit until he is transferred to the floor on 3/2 and PCCM requested transition to [facility] for medical assistance while patient remains under the neurosurgery service.  Significant Hospital Events: 2/3 ACDF C3-4, C4-5, C5-5, and C6-7 w/ Dr. Annette Stable 2/6 SLP eval for difficulty swallowing 2/7 Code stroke overnight, neg for LVO, CT  showing soft tissue swelling.  PCCM consulted for concern of airway management and AMS.  Went for evacuation of hematoma  2/18 CT Abd/Pelvis: showing bilateral segmental Pes, started on heparin and showing worsening pneumonia, febrile to  103.  PCT rising, 34.6, restarted on abx 2/19 possible aspiration w/ severe hypoxia; intubated; Switching from heparin to angiomax given subtherapeutic levels despite increase in rate. 2/20 Echo shows normal LV systolic function. There was notation of a + McConnell's sign c/w a large pulmonary embolus which is consistent with the finding of bilateral segmental pulmonary emboli noted on CT 07/14/2021. 2/21 rash appreciated on back; stopped cefepim/vanc switched to zosyn; required low dose levo with increase in fentanyl 2/22 remains intubated; unable to extubate to do increase RR and thick secretions 2/23 [name] infusion added 2/24: weaning fentanyl.  2/26 extubated 2/28 tachycardia persists 3/2: Modified barium swallow, continues n.p.o. with core track in place, likely will need PEG tube. 3/6: s/p IR G-tube placement Now medically clear awaiting SNF placement.       Assessment/Plan:   Principal Problem:   Cervical myelopathy ([place]); severe cervical spinal stenosis with cord compression s/p ACDF [id number] on 2/3, complicated by epidural hematoma s/p evacuation 2/7 Active Problems:   Acute respiratory failure with hypoxia (HCC)   Acute pulmonary embolism (HCC)   Acute metabolic encephalopathy   Severe sepsis due to recurrent aspiration pneumonia   Sinus tachycardia   Elevated liver enzymes   Hyponatremia   Tobacco use disorder   Physical deconditioning   Urinary retention   Protein-calorie malnutrition, severe (HCC)   Nutrition Problem: Severe Malnutrition Etiology: social /  environmental circumstances  Signs/Symptoms: severe muscle depletion, severe fat depletion   Body mass index is 16.45 kg/m.  Diet Order             Diet NPO time  specified Except for: Ice Chips  Diet effective now                        Assessment and Plan: * Cervical myelopathy (HCC); severe cervical spinal stenosis with cord compression s/p ACDF C3-7 on 2/3, complicated by epidural hematoma s/p evacuation 2/7  Awaiting placement to SNF. Follow up with neurosurgeon  Acute respiratory failure with hypoxia (HCC), aspiration pneumonia Improved and tolerating room air.  Patient did require ventilatory support in the intensive care unit and was successfully extubated on 07/22/2021.  Patient completed extensive course of empiric antibiotics.  Continue aspiration precautions  Acute pulmonary embolism (HCC) Continue Eliquis CTA chest showed bilateral segmental PE.  LE [place] negative for DVT.  TTE with normal LV systolic function.    Acute metabolic encephalopathy CT head, MRI brain, EEG, TSH, B12, ammonia unrevealing.   Continue Seroquel and clonazepam and wean off as able.  Severe sepsis due to recurrent aspiration pneumonia Likely recurrent issue during hospitalization, remains n.p.o. due to continued aspiration risk per SLP.   Completed extensive course of antibiotics while under ICU care. NPO> tube feeds > s/p IR gtube 3/6 Continue aspiration precautions  Sinus tachycardia Multifactorial including sepsis, dehydration, PE, agitation.   Continue metoprolol 100 mg twice daily  Elevated liver enzymes Etiology likely sec. to sepsis from aspiration pneumonia as above.   Acute hepatitis panel negative.  RUQ ultrasound unremarkable. Continue monitor LFTs intermittently  Hyponatremia Improved   Tobacco use disorder Encouraged cessation. Nicotine patch; decreased to 14 mg on 3/8  Physical deconditioning Significant weakness in all extremities as a result of cervical myelopathy and acute illness. Continue PT/OT efforts, will need SNF placement.  Urinary retention Continue bethanechol Continue Foley catheter, Continue monitor urinary  output  Protein-calorie malnutrition, severe (HCC) As evidenced by poor p.o. intake, significant muscle mass and subcu fat loss and significant weight loss (about 16 pounds since this hospitalization). Nutrition Problem: Severe Malnutrition (in the context of social/environmental circumstances) Etiology:  (inadequate energy intake) Signs/Symptoms: mild fat depletion, severe muscle depletion, severe muscle depletion Interventions: Refer to RD note for recommendations  -- Dietitian following, appreciate assistance.   --s/p IR gtube 3/6.  --Continue tube feeds --continue SLP efforts  Hyperglycemia-resolved as of 07/12/2021 Likely due to steroid.  Resolved.  A1c 5.2%.            Consultants: Neurosurgery, neurologist, intensivist  Procedures:  ACDF C3-7, neurosurgery Dr. Dutch Quint 2/3 Hematoma evacuation 2/7 Vascular duplex ultrasound bilateral lower extremities 2/19 TTE 2/20 TTE 3/1 IR G-tube placement 3/6    Medications:    apixaban  5 mg Per Tube BID   bethanechol  10 mg Per Tube TID   chlorhexidine  15 mL Mouth Rinse BID   Chlorhexidine Gluconate Cloth  6 each Topical Daily   clonazepam  0.5 mg Per Tube BID   feeding supplement (PROSource TF)  45 mL Per Tube TID   folic acid  1 mg Per Tube Daily   mouth rinse  15 mL Mouth Rinse q12n4p   metoprolol tartrate  100 mg Per Tube BID   multivitamin  15 mL Per Tube Daily   nicotine  14 mg Transdermal Daily   QUEtiapine  12.5 mg Per Tube BID   thiamine  100 mg Per Tube Daily   Continuous Infusions:  sodium chloride Stopped (07/23/21 1122)   dextrose 5 % and 0.9% NaCl 75 mL/hr at 08/05/21 [id number]   feeding supplement (VITAL 1.5 CAL) 1,000 mL (08/04/21 2125)     Anti-infectives (From admission, onward)    Start     Dose/Rate Route Frequency Ordered Stop   07/30/21 0937  ceFAZolin (ANCEF) 2-4 GM/100ML-% IVPB       Note to Pharmacy: Domenick Bookbinder E: cabinet override      07/30/21 0937 07/30/21 2144   07/30/21 0937   ceFAZolin (ANCEF) IVPB 2g/100 mL premix        over 30 Minutes Intravenous Continuous PRN 07/30/21 0948 07/30/21 0937   07/17/21 1300  piperacillin-tazobactam (ZOSYN) IVPB 3.375 g        3.375 g 12.5 mL/hr over 240 Minutes Intravenous Every 8 hours 07/17/21 1200 07/19/21 0928   07/15/21 0800  ceFEPIme (MAXIPIME) 2 g in sodium chloride 0.9 % 100 mL IVPB  Status:  Discontinued        2 g 200 mL/hr over 30 Minutes Intravenous Every 8 hours 07/15/21 0723 07/17/21 1137   07/14/21 2200  ceFEPIme (MAXIPIME) 2 g in sodium chloride 0.9 % 100 mL IVPB  Status:  Discontinued        2 g 200 mL/hr over 30 Minutes Intravenous Every 12 hours 07/14/21 0856 07/15/21 0723   07/14/21 1000  metroNIDAZOLE (FLAGYL) IVPB 500 mg  Status:  Discontinued        500 mg 100 mL/hr over 60 Minutes Intravenous Every 12 hours 07/14/21 0806 07/15/21 1450   07/14/21 1000  vancomycin (VANCOREADY) IVPB 1250 mg/250 mL  Status:  Discontinued        1,250 mg 166.7 mL/hr over 90 Minutes Intravenous Every 24 hours 07/14/21 0852 07/17/21 1137   07/14/21 0900  vancomycin (VANCOCIN) IVPB 1000 mg/200 mL premix  Status:  Discontinued        1,000 mg 200 mL/hr over 60 Minutes Intravenous  Once 07/14/21 0806 07/14/21 0852   07/14/21 0830  ceFEPIme (MAXIPIME) 2 g in sodium chloride 0.9 % 100 mL IVPB        2 g 200 mL/hr over 30 Minutes Intravenous  Once 07/14/21 0806 07/14/21 1217   07/04/21 1500  Ampicillin-Sulbactam (UNASYN) 3 g in sodium chloride 0.9 % 100 mL IVPB  Status:  Discontinued        3 g 200 mL/hr over 30 Minutes Intravenous Every 8 hours 07/04/21 0806 07/07/21 1009   07/04/21 0100  vancomycin (VANCOCIN) IVPB 1000 mg/200 mL premix  Status:  Discontinued        1,000 mg 200 mL/hr over 60 Minutes Intravenous Every 12 hours 07/03/21 1105 07/04/21 0750   07/03/21 1522  vancomycin (VANCOCIN) powder  Status:  Discontinued          As needed 07/03/21 1524 07/03/21 1549   07/03/21 1200  metroNIDAZOLE (FLAGYL) IVPB 500 mg  Status:   Discontinued        500 mg 100 mL/hr over 60 Minutes Intravenous 2 times daily 07/03/21 1100 07/03/21 1107   07/03/21 1200  ceFEPIme (MAXIPIME) 2 g in sodium chloride 0.9 % 100 mL IVPB  Status:  Discontinued        2 g 200 mL/hr over 30 Minutes Intravenous Every 8 hours 07/03/21 1102 07/04/21 0750   07/03/21 1200  metroNIDAZOLE (FLAGYL) IVPB 500 mg  Status:  Discontinued        500 mg 100  mL/hr over 60 Minutes Intravenous Every 8 hours 07/03/21 1107 07/04/21 0750   07/03/21 1145  vancomycin (VANCOREADY) IVPB 1500 mg/300 mL        1,500 mg 150 mL/hr over 120 Minutes Intravenous  Once 07/03/21 1100 07/03/21 1607   07/03/21 1045  Ampicillin-Sulbactam (UNASYN) 3 g in sodium chloride 0.9 % 100 mL IVPB  Status:  Discontinued        3 g 200 mL/hr over 30 Minutes Intravenous Every 6 hours 07/03/21 0957 07/03/21 1100   06/29/21 1215  ceFAZolin (ANCEF) IVPB 1 g/50 mL premix        1 g 100 mL/hr over 30 Minutes Intravenous Every 8 hours 06/29/21 1205 06/29/21 1932   06/29/21 0800  ceFAZolin (ANCEF) IVPB 2g/100 mL premix        2 g 200 mL/hr over 30 Minutes Intravenous On call to O.R. 06/29/21 [id number] 06/29/21 0806   06/29/21 0751  ceFAZolin (ANCEF) 2-4 GM/100ML-% IVPB       Note to Pharmacy: Barbie Haggis N: cabinet override      06/29/21 0751 06/29/21 0819              Family Communication/Anticipated D/C date and plan/Code Status   DVT prophylaxis: SCD's Start: 06/29/21 1231 apixaban (ELIQUIS) tablet 5 mg     Code Status: Full Code  Family Communication: None Disposition Plan: Plan to discharge to SNF   Status is: Inpatient  Remains inpatient appropriate because: Awaiting placement to SNF         Subjective:   Interval events noted.  He is confused unable to provide an adequate history. His nurse and nurse technician were at the bedside.  Objective:    Vitals:   08/05/21 0500 08/05/21 0636 08/05/21 0747 08/05/21 1050  BP:  130/83    Pulse:  100    Resp:  20     Temp:   98 F (36.7 C) 98.2 F (36.8 C)  TempSrc:   Oral Oral  SpO2:  100%    Weight: 52 kg     Height:       No data found.   Intake/Output Summary (Last 24 hours) at 08/05/2021 1209 Last data filed at 08/05/2021 0643 Gross per 24 hour  Intake 493.73 ml  Output 2400 ml  Net -1906.27 ml   Filed Weights   08/02/21 0500 08/03/21 0500 08/05/21 0500  Weight: 51.2 kg 52.8 kg 52 kg    Exam:  GEN: NAD SKIN: Warm and dry EYES: EOMI ENT: MMM CV: RRR PULM: CTA B ABD: soft, ND, NT, +BS CNS: Alert but disoriented EXT: No edema or tenderness         Data Reviewed:   I have personally reviewed following labs and imaging studies:  Labs: Labs show the following:   Basic Metabolic Panel: Recent Labs  Lab 07/31/21 0113 08/01/21 0536 08/02/21 0102 08/03/21 0819  NA 129* 128* 133* 132*  K 4.2 3.7 3.4* 4.1  CL 97* 97* 103 104  CO2 22 22 20* 21*  GLUCOSE 92 93 107* 101*  BUN 13 11 11 10   CREATININE 0.59* 0.59* 0.55* 0.59*  CALCIUM 9.5 9.4 9.3 9.3  MG 1.6*  --  1.6*  --   PHOS 4.0  --  3.7  --    GFR Estimated Creatinine Clearance: 72.2 mL/min (A) (by C-G formula based on SCr of 0.59 mg/dL (L)). Liver Function Tests: Recent Labs  Lab 07/31/21 0113 08/02/21 0102  AST 46* 30  ALT 60* 37  ALKPHOS 237* 183*  BILITOT 0.8 0.6  PROT 8.1 7.4  ALBUMIN 2.5* 2.3*   No results for input(s): LIPASE, AMYLASE in the last 168 hours. No results for input(s): AMMONIA in the last 168 hours. Coagulation profile No results for input(s): INR, PROTIME in the last 168 hours.  CBC: Recent Labs  Lab 07/31/21 0113 08/01/21 0536 08/04/21 0448  WBC 15.6* 12.5* 9.0  HGB 10.1* 9.9* 9.9*  HCT 30.6* 30.2* 29.5*  MCV 94.2 92.1 93.4  PLT 811* 754* 639*   Cardiac Enzymes: No results for input(s): CKTOTAL, CKMB, CKMBINDEX, TROPONINI in the last 168 hours. BNP (last 3 results) No results for input(s): PROBNP in the last 8760 hours. CBG: Recent Labs  Lab 08/04/21 1643  08/04/21 1943 08/04/21 2326 08/05/21 0346 08/05/21 0750  GLUCAP 111* 116* 126* 101* 100*   D-Dimer: No results for input(s): DDIMER in the last 72 hours. Hgb A1c: No results for input(s): HGBA1C in the last 72 hours. Lipid Profile: No results for input(s): CHOL, HDL, LDLCALC, TRIG, CHOLHDL, LDLDIRECT in the last 72 hours. Thyroid function studies: No results for input(s): TSH, T4TOTAL, T3FREE, THYROIDAB in the last 72 hours.  Invalid input(s): FREET3 Anemia work up: No results for input(s): VITAMINB12, FOLATE, FERRITIN, TIBC, IRON, RETICCTPCT in the last 72 hours. Sepsis Labs: Recent Labs  Lab 07/31/21 0113 08/01/21 0536 08/04/21 0448  WBC 15.6* 12.5* 9.0    Microbiology No results found for this or any previous visit (from the past 240 hour(s)).  Procedures and diagnostic studies:  No results found.             LOS: 37 days   Graham Copywriter, advertising on [email or web address]. If 7PM-7AM, please contact night-coverage at [email or web address]     08/05/2021, 12:09 PM

## 2021-08-05 NOTE — Plan of Care (Signed)
?  Problem: Education: ?Goal: Understanding of discharge needs will improve ?Outcome: Progressing ?  ?Problem: Activity: ?Goal: Ability to avoid complications of mobility impairment will improve ?Outcome: Progressing ?  ?Problem: Bowel/Gastric: ?Goal: Gastrointestinal status for postoperative course will improve ?Outcome: Progressing ?  ?

## 2021-08-05 NOTE — Plan of Care (Signed)
?  Problem: Education: ?Goal: Ability to verbalize activity precautions or restrictions will improve ?Outcome: Not Progressing ?Goal: Knowledge of the prescribed therapeutic regimen will improve ?Outcome: Not Progressing ?Goal: Understanding of discharge needs will improve ?Outcome: Not Progressing ?  ?

## 2021-08-05 NOTE — Progress Notes (Signed)
Patient ID: Brian [name], male   DOB: 1960-12-21, 61 y.o.   MRN: [id number] ?BP 130/83   Pulse 100   Temp 98.2 ?F (36.8 ?C) (Oral)   Resp 20   Ht 5\' 10"  (1.778 m)   Wt 52 kg   SpO2 100%   BMI 16.45 kg/m?  ?Alert, follows commands ?No change in neurological exam from 3/11 ?No new recommendations ?Continue supportive care ?

## 2021-08-06 DIAGNOSIS — G959 Disease of spinal cord, unspecified: Secondary | ICD-10-CM | POA: Diagnosis not present

## 2021-08-06 LAB — GLUCOSE, CAPILLARY
Glucose-Capillary: 108 mg/dL — ABNORMAL HIGH (ref 70–99)
Glucose-Capillary: 114 mg/dL — ABNORMAL HIGH (ref 70–99)
Glucose-Capillary: 117 mg/dL — ABNORMAL HIGH (ref 70–99)
Glucose-Capillary: 120 mg/dL — ABNORMAL HIGH (ref 70–99)
Glucose-Capillary: 125 mg/dL — ABNORMAL HIGH (ref 70–99)
Glucose-Capillary: 136 mg/dL — ABNORMAL HIGH (ref 70–99)

## 2021-08-06 MED ORDER — QUETIAPINE FUMARATE 25 MG PO TABS
12.5000 mg | ORAL_TABLET | Freq: Every day | ORAL | Status: DC
  Administered 2021-08-07 – 2021-08-08 (×2): 12.5 mg
  Filled 2021-08-06 (×2): qty 1

## 2021-08-06 MED ORDER — CLONAZEPAM 0.25 MG PO TBDP
0.2500 mg | ORAL_TABLET | Freq: Two times a day (BID) | ORAL | Status: DC
  Administered 2021-08-06 – 2021-08-10 (×8): 0.25 mg
  Filled 2021-08-06 (×8): qty 1

## 2021-08-06 MED ORDER — OSMOLITE 1.5 CAL PO LIQD
1000.0000 mL | ORAL | Status: DC
  Administered 2021-08-06 – 2021-08-16 (×10): 1000 mL

## 2021-08-06 NOTE — Progress Notes (Signed)
Nutrition Follow-up ? ?DOCUMENTATION CODES:  ?Severe malnutrition in context of social or environmental circumstances ? ?INTERVENTION:  ?Continue TF via PEG: ?-transition to Osmolite 1.5 at 50 ml/hr ?ProSource TF 45 mL TID ?This provides 1920 kcals, 108 g of protein and 914 mL of free water ? ?NUTRITION DIAGNOSIS:  ?Severe Malnutrition related to social / environmental circumstances as evidenced by severe muscle depletion, severe fat depletion. -- ongoing  ? ?GOAL:  ?Patient will meet greater than or equal to 90% of their needs -- met with TF at goal ? ?MONITOR:  ?TF tolerance ? ?REASON FOR ASSESSMENT:  ?Consult ?Assessment of nutrition requirement/status, Enteral/tube feeding initiation and management ? ?ASSESSMENT:  ?Pt with hx EtOH abuse, tobacco use, and spinal stenosis initially presented 2/3 for planned multilevel anterior cervical decompression and fusion surgery after experiencing progressive bilateral upper and lower extremity weakness and spasticity due to critical multilevel cervical spinal stenosis with spinal cord compression and signal change. ? ?02/03 - s/p anterior cervical discectomy with interbody fusion  ?02/05 - pt initially complained of worsening swallowing function ?02/06 - MBS, NPO per SLP ?02/07 - Code Stroke activated (negative) but transferred to ICU, s/p re-exploration anterior cervical fusion with evacuation of epidural hematoma ?02/08 - Cortrak tube placed (tip gastric), tube feeds initiated ?02/10 - MBS, diet advanced to dysphagia 2 with nectar-thick liquids, Cortrak removed ?02/11 - NPO ?02/13 - diet advanced to dysphagia 2 with nectar-thick liquids ?02/16 - diet advanced to dysphagia 2 with thin liquids ?02/19- Intubated after desaturation, possibly from PO intake ?02/20 - s/p cortrak tube; post pyloric  ?02/22 - TF off, golytely given ?02/23 - restarted and advancing TF ?02/26 - Extubated ?03/02 - failed MBS ?03/06 - PEG placed ? ?Pt sleeping at time of RD visit. Per RN, pt  tolerating TF via PEG. Current TF regimen is Vital 1.5  @ 37m/hr and 414mProsource TF TID; will transition pt to Osmolite 1.5  ? ?UOP: [id number] hours ?I/O: -11203m41mnce admit ? ?Admit wt 61.7 kg ?Current wt 52 kg ? ?Labs reviewed ?CBGs 94-132 x 24 hours ?Medications: MVI, thiamine, folvite ?IVF: D5-NS @ 75ml56m? ?Diet Order:   ?Diet Order   ? ?       ?  Diet NPO time specified Except for: Ice Chips  Diet effective now       ?  ? ?  ?  ? ?  ? ?EDUCATION NEEDS:  ?Education needs have been addressed ? ?Skin:  Skin Assessment: Skin Integrity Issues: ?Skin Integrity Issues:: Other (Comment) ?Incisions: closed neck ?Other: MASD buttocks ? ?Last BM:  3/13 ? ?Height:  ?Ht Readings from Last 1 Encounters:  ?07/15/21 _0  (1.778 m)  ? ?Weight:  ?Wt Readings from Last 1 Encounters:  ?08/05/21 52 kg  ? ?Ideal Body Weight:  78.2 kg ? ?BMI:  Body mass index is 16.45 kg/m?. ? ?Estimated Nutritional Needs:  ?Kcal:  1800-2000 ?Protein:  100-115 grams ?Fluid:  >1.8 L/day ? ? ? ?AmandTheone Stanley, RD, LDN (she/her/hers) ?RD pager number and weekend/on-call pager number located in [place] ?

## 2021-08-06 NOTE — Plan of Care (Signed)
?  Problem: Education: ?Goal: Ability to verbalize activity precautions or restrictions will improve ?Outcome: Not Progressing ?Goal: Knowledge of the prescribed therapeutic regimen will improve ?Outcome: Not Progressing ?Goal: Understanding of discharge needs will improve ?Outcome: Not Progressing ?  ?

## 2021-08-06 NOTE — Progress Notes (Signed)
Overall stable.  No new issues or problems.  Continue supportive care.  Awaiting placement. ?

## 2021-08-06 NOTE — Progress Notes (Signed)
Speech Language Pathology Treatment: Dysphagia  ?Patient Details ?Name: Brian [name] ?MRN: [id number] ?DOB: April 11, 1961 ?Today's Date: 08/06/2021 ?Time: [id number] ?SLP Time Calculation (min) (ACUTE ONLY): 15 min ? ?Assessment / Plan / Recommendation ?Clinical Impression ? Mr. Genova was seen for skilled SLP services to target dysphagia goals. The pt was seen at bedside and appeared lethargic. Pt required maximal tactile and verbal cues to focus attention on PO ice chip trials. Patient did not initiate with ice chips. SLP administered 2 trials of ice chips to the patient with no overt s/s of aspiration. Patient refused further PO trials, as evidenced by retracted lips and shaking his head "no." Patient's vocal quality remained clear throughout the session. Although vocal quality was clear, pt's speech productions were largely nonsensical and confused. Intelligibility was reduced at the phrase level. Cognition remains a barrier for the patient and increases risk of aspiration. SLP to continue to follow acutely for further PO trials and readiness for instrumental testing with improved focused attention.  ?  ?HPI HPI: Pt is a 61 y/o admitted 06/29/21 following C3-7 ACDF after progressive cervical myelopathy symptoms with subsequent fall and severe incomplete SCI. Significant prevertebral swelling per imaging. Pt developed some difficulty swallowing on 2/5 and SLP consulted. MBS 2/6 revealed severe edema s/p ACDF, oral holding, a pharyngeal delay, inconsistent hyolaryngeal elevation, incomplete epiglottic inversion due to edema. Aspiration noted accross consistencies and and NPO status was recommended. Repeat MBS 2/10 showed significantly reduced pharyngeal edema and a dysphagia 2 diet with nectar thick liquids was initiated. Pt transitioned to thin liquids 2/16 and susbequently returned to nectar thick 2/17 due to concern for aspiration. Pt developed pna, possible aspiration, requiring intubation 2/19-26. Cortrak placed  2/20. PEG placed in IR on 3/6. No significant PMH. ?  ?   ?SLP Plan ? Continue with current plan of care ? ?  ?  ?Recommendations for follow up therapy are one component of a multi-disciplinary discharge planning process, led by the attending physician.  Recommendations may be updated based on patient status, additional functional criteria and insurance authorization. ?  ? ?Recommendations  ?Diet recommendations: NPO ?Medication Administration: Via alternative means  ?   ?    ?   ? ? ? ? Oral Care Recommendations: Oral care QID;Oral care prior to ice chip/H20 ?Follow Up Recommendations: Skilled nursing-short term rehab (<3 hours/day) ?Assistance recommended at discharge: Frequent or constant Supervision/Assistance ?SLP Visit Diagnosis: Dysphagia, pharyngeal phase (R13.13) ?Plan: Continue with current plan of care ? ? ? ? ?  ?  ? ? ?Vaughan Sine ? ?08/06/2021, 12:23 PM ?

## 2021-08-06 NOTE — Progress Notes (Signed)
Occupational Therapy Treatment ?Patient Details ?Name: Brian [name] ?MRN: [id number] ?DOB: 01/06/1961 ?Today's Date: 08/06/2021 ? ? ?History of present illness Pt is a 62 y/o male admitted 06/29/21 following C3-7 ACDF after progressive cervical myelopathy symptoms including ataxia, bil UE and LE shaking and weakness, and loss of function in his hands with subsequent fall. Pt initially had improvement in neurological symptoms, but postoperative course was complicated by dysphasia and ataxia secondary to and epidural hematoma with evacuation of hematoma on 2/7. Course complicated by delirium secondary to encephalopathy, sepsis secondary to aspiration PNA, reintubated 2/19-2/26 for airway protection, bilat segmental PEs 2/18. PEG placed 3/6. PMHx includes tobacco and EtOH use disorder. ?  ?OT comments ? Session today limited by lethargy. Initially attempted to arouse patient using cool wash cloth, when therapist cleaned face Pt with grimacing. Pt able to perform fash washing with mod A, support under elbow. Then Pt total A +1 to come sit EOB, Pt with no increased arousal. Sat EOB, cues Pt to weight shift and Pt able to follow commands 50% of the time. Pt with eyes closed almost the entire session, not really verbally responding to questions at this time. Pt did demonstrate improved movement in BUE throughout session. OT will continue to follow acutely. POC remains appropriate, and goals updated.  ? ?Recommendations for follow up therapy are one component of a multi-disciplinary discharge planning process, led by the attending physician.  Recommendations may be updated based on patient status, additional functional criteria and insurance authorization. ?   ?Follow Up Recommendations ? Skilled nursing-short term rehab (<3 hours/day)  ?  ?Assistance Recommended at Discharge Frequent or constant Supervision/Assistance  ?Patient can return home with the following ? Two people to help with walking and/or transfers;Direct  supervision/assist for medications management;Assist for transportation;Help with stairs or ramp for entrance;Assistance with feeding;Two people to help with bathing/dressing/bathroom;Assistance with cooking/housework;Direct supervision/assist for financial management ?  ?Equipment Recommendations ? Wheelchair (measurements OT);Wheelchair cushion (measurements OT);Hospital bed;BSC/3in1;Tub/shower seat  ?  ?Recommendations for Other Services   ? ?  ?Precautions / Restrictions Precautions ?Precautions: Fall ?Restrictions ?Weight Bearing Restrictions: No  ? ? ?  ? ?Mobility Bed Mobility ?Overal bed mobility: Needs Assistance ?Bed Mobility: Supine to Sit, Sit to Sidelying ?  ?  ?Supine to sit: Total assist ?Sit to supine: Total assist ?  ?General bed mobility comments: Assist with all aspects. ?  ? ?Transfers ?  ?  ?  ?  ?  ?  ?  ?  ?  ?General transfer comment: NT this session due to increased lethargy ?  ?  ?Balance Overall balance assessment: Needs assistance ?Sitting-balance support: Single extremity supported ?Sitting balance-Leahy Scale: Poor ?Sitting balance - Comments: Sat EOB x 8 min needing at least min A for seated balance ?Postural control: Posterior lean ?  ?  ?  ?  ?  ?  ?  ?  ?  ?  ?  ?  ?  ?  ?   ? ?ADL either performed or assessed with clinical judgement  ? ?ADL   ?Eating/Feeding: NPO ?Eating/Feeding Details (indicate cue type and reason): PEG tube, poor dentation ?Grooming: Bed level;Moderate assistance;Wash/dry face ?Grooming Details (indicate cue type and reason): able to bring wash cloth to lips and wipe mouth, assistance to reach hand higher to cheeks and eyes; seemed to be very sensative and grimaced with face washing today ?  ?  ?  ?  ?  ?  ?  ?  ?  ?  ?  ?  ?  ?  ?  Functional mobility during ADLs: Total assistance ?General ADL Comments: poor attention, increased lethargy ?  ? ?Extremity/Trunk Assessment   ?  ?  ?  ?  ?  ? ?Vision   ?  ?  ?Perception   ?  ?Praxis   ?  ? ?Cognition  Arousal/Alertness: Lethargic ?Behavior During Therapy: Flat affect ?Overall Cognitive Status: Impaired/Different from baseline ?Area of Impairment: Orientation, Attention, Memory, Following commands, Safety/judgement, Awareness, Problem solving ?  ?  ?  ?  ?  ?  ?  ?  ?Orientation Level: Disoriented to, Time, Situation, Place ?Current Attention Level: Sustained ?Memory: Decreased short-term memory, Decreased recall of precautions ?Following Commands: Follows one step commands inconsistently ?Safety/Judgement: Decreased awareness of safety, Decreased awareness of deficits ?Awareness: Intellectual ?Problem Solving: Difficulty sequencing, Requires verbal cues, Requires tactile cues, Slow processing, Decreased initiation ?General Comments: difficulty keeping eyes open during session, very lethargic and largely not responding to questions ?  ?  ?   ?Exercises   ? ?  ?Shoulder Instructions   ? ? ?  ?General Comments VSS on RA, RN states that he does not sleep well at night which might contribute to lethargy today  ? ? ?Pertinent Vitals/ Pain       Pain Assessment ?Pain Assessment: Faces (Simultaneous filing. User may not have seen previous data.) ?Faces Pain Scale: Hurts even more ?Pain Location: sudden grimace and pain, unable to indicate location or cause of pain ?Pain Descriptors / Indicators: Grimacing ?Pain Intervention(s): Monitored during session, Repositioned ? ?Home Living   ?  ?  ?  ?  ?  ?  ?  ?  ?  ?  ?  ?  ?  ?  ?  ?  ?  ?  ? ?  ?Prior Functioning/Environment    ?  ?  ?  ?   ? ?Frequency ? Min 2X/week  ? ? ? ? ?  ?Progress Toward Goals ? ?OT Goals(current goals can now be found in the care plan section) ? Progress towards OT goals: Not progressing toward goals - comment (lethargy) ? ?Acute Rehab OT Goals ?Patient Stated Goal: did not state ?OT Goal Formulation: With patient ?Time For Goal Achievement: 08/20/21 ?Potential to Achieve Goals: Fair ?ADL Goals ?Pt Will Perform Grooming: sitting;with min assist ?Pt  Will Perform Upper Body Bathing: with mod assist;sitting ?Pt/caregiver will Perform Home Exercise Program: Increased ROM;Increased strength;Both right and left upper extremity;With theraputty;With minimal assist;With written HEP provided ?Additional ADL Goal #1: Pt will grasp objects with R hand and bring to mouth 10/10 trials in prep for self feeding ?Additional ADL Goal #2: Pt will grasp/release 5/5 objects with L hand to increase indep with ADLs  ?Plan Discharge plan remains appropriate   ? ?Co-evaluation ? ? ?   ?  ?  ?  ?  ? ?  ?AM-PAC OT "6 Clicks" Daily Activity     ?Outcome Measure ? ? Help from another person eating meals?: Total ?Help from another person taking care of personal grooming?: A Lot ?Help from another person toileting, which includes using toliet, bedpan, or urinal?: Total ?Help from another person bathing (including washing, rinsing, drying)?: Total ?Help from another person to put on and taking off regular upper body clothing?: Total ?Help from another person to put on and taking off regular lower body clothing?: Total ?6 Click Score: 7 ? ?  ?End of Session   ? ?OT Visit Diagnosis: Other abnormalities of gait and mobility (R26.89);Muscle weakness (generalized) (M62.81);Feeding difficulties (R63.3) ?  ?  Activity Tolerance Patient limited by lethargy ?  ?Patient Left in bed;with call bell/phone within reach;with bed alarm set ?  ?Nurse Communication Mobility status;Precautions ?  ? ?   ? ?Time: [id number] ?OT Time Calculation (min): 24 min ? ?Charges: OT General Charges ?$OT Visit: 1 Visit ?OT Treatments ?$Self Care/Home Management : 8-22 mins ?$Therapeutic Activity: 8-22 mins ? ?[name] OTR/L ?Acute Rehabilitation Services ?Pager: [phone] ?Office: [phone] ? ? ?Merri Ray Regena Delucchi ?08/06/2021, 12:35 PM ?

## 2021-08-06 NOTE — Progress Notes (Signed)
PROGRESS NOTE    Brian [name]  [id number] DOB: 1961/05/23 DOA: 06/29/2021 PCP: Merryl Hacker, No    Brief Narrative:  Brian [name] is a 61 year old male with past medical history significant for tobacco and EtOH use disorder who was admitted by neurosurgery for cervical myelopathy due to critical multilevel cervical spinal canal stenosis C3-6 with spinal cord compression and spinal cord signal change after recent fall with progressive upper and lower extremity weakness and tremors.  He was admitted on 2/3 and underwent ACDF of C3-4, C4-5, C5-5, and C6-7.  Post-operatively he was progressing slowly with residual weakness in both hands but improving lower extremity strength and function.  He developed some difficulty swallowing on 2/5 and SLP ordered and placed on thickened liquids.   Overnight 2/6, patient with increased difficulty swallowing and was made NPO but also noted to have dysarthria and difficulty moving extremities with numbness.  SLP evaluated and found to be moderate aspiration risk.   Also progressively tachycardic, tachypneic, and now febrile.  Code stroke activated and taken for CT/ CTA head and neck and was given decadron 10mg  once.  NIHSS 18.  CTH was negative for acute findings, and CTA head neck did not reveal any LVO but noted significant prevertebral soft tissue swelling with foci of gas present at the ventral epidural space at C4-5, small collection not excluded. On 2/7, patient with worsening confusion and concern for airway involvement, PCCM consulted and remained in the intensive care unit until he is transferred to the floor on 3/2 and PCCM requested transition to [facility] for medical assistance while patient remains under the neurosurgery service.  Significant Hospital Events: 2/3 ACDF C3-4, C4-5, C5-5, and C6-7 w/ Dr. Annette Stable 2/6 SLP eval for difficulty swallowing 2/7 Code stroke overnight, neg for LVO, CT showing soft tissue swelling.  PCCM consulted for concern of airway  management and AMS.  Went for evacuation of hematoma  2/18 CT Abd/Pelvis: showing bilateral segmental Pes, started on heparin and showing worsening pneumonia, febrile to  103.  PCT rising, 34.6, restarted on abx 2/19 possible aspiration w/ severe hypoxia; intubated; Switching from heparin to angiomax given subtherapeutic levels despite increase in rate. 2/20 Echo shows normal LV systolic function. There was notation of a + McConnell's sign c/w a large pulmonary embolus which is consistent with the finding of bilateral segmental pulmonary emboli noted on CT 07/14/2021. 2/21 rash appreciated on back; stopped cefepim/vanc switched to zosyn; required low dose levo with increase in fentanyl 2/22 remains intubated; unable to extubate to do increase RR and thick secretions 2/23 [name] infusion added 2/24: weaning fentanyl.  2/26 extubated 2/28 tachycardia persists 3/2: Modified barium swallow, continues n.p.o. with core track in place, likely will need PEG tube. 3/6: s/p IR G-tube placement Now medically clear awaiting SNF placement.     Assessment & Plan:   Assessment and Plan: * Cervical myelopathy ([place]); severe cervical spinal stenosis with cord compression s/p ACDF [id number] on 2/3, complicated by epidural hematoma s/p evacuation 2/7 Patient initially presented s/p recent fall with progressive upper and lower extremity weakness and tremors, He was found to have critical multilevel cervical spinal stenosis @ C3-3 6 with spinal cord compression and spinal cord signal change and underwent ACDF C3-4, C4-5, C5-5, and C6/7 by neurosurgery Dr. Trenton Gammon on 06/29/2021.  Postoperative leg complicated by epidural hematoma s/p evacuation on 2/7. --Continue PT/OT/SLP efforts, Awaiting SNF --Further as per neurosurgery recommendation.  Acute respiratory failure with hypoxia (HCC) Likely Multifactorial with significant dysphagia and recurrent aspiration  pneumonia events during initial hospitalization following ACDF  surgery with postoperative complications of postoperative cervical hematoma.  Patient did require ventilatory support in the intensive care unit and was successfully extubated on 07/22/2021.  Patient completed extensive course of empiric antibiotics.  Oxygen now weaned off, Now on room air. --Continue Aspiration precautions     Acute pulmonary embolism ([place]) CTA chest showed bilateral segmental PE.  LE [place] negative for DVT.   TTE with normal LV systolic function.  Transitioned Lovenox to Eliquis. Oxygen now weaned off.  Acute metabolic encephalopathy CT head, MRI brain, EEG, TSH, B12, ammonia unrevealing.   Etiology likely multifactorial with acute respiratory failure sec. to aspiration pneumonia, bilateral pulmonary embolism.   Completed course of antibiotics. --Reduced Seroquel dose to 12.5 mg qHS on 3/13; continue to wean --Reduced clonazepam to 0.25 mg BID on 3/13; continue to wean  Severe sepsis due to recurrent aspiration pneumonia Likely recurrent issue during hospitalization, remains n.p.o. due to continued aspiration risk per SLP.  Completed extensive course of antibiotics while under ICU care. S/p IR gtube 3/6. --Continue tube feeds --Continue aspiration precautions --Continue SLP efforts while inpatient  Sinus tachycardia Multifactorial including sepsis, dehydration, PE, agitation.   --Continue metoprolol 100 mg twice daily    Elevated liver enzymes Etiology likely sec. to sepsis from aspiration pneumonia as above.   Acute hepatitis panel negative.  RUQ ultrasound unremarkable. Continue monitor LFTs intermittently  Hyponatremia Etiology likely secondary to dehydration.  Urine sodium low.  Improved with IV fluids.   Sodium now normalized on tube feeds.   Tobacco use disorder Encouraged cessation. --Nicotine patch; decreased to 14 mg on 3/8  Physical deconditioning Significant weakness in all extremities as a result of cervical myelopathy and acute  illness. --Continue PT/OT efforts, will need SNF placement.  Urinary retention --Continue bethanechol --Continue Foley catheter, --Continue monitor urinary output  Protein-calorie malnutrition, severe ([place]) As evidenced by poor p.o. intake, significant muscle mass and subcu fat loss and significant weight loss (about 16 pounds since this hospitalization). Nutrition Problem: Severe Malnutrition (in the context of social/environmental circumstances) Etiology:  (inadequate energy intake) Signs/Symptoms: mild fat depletion, severe muscle depletion, severe muscle depletion Interventions: Refer to RD note for recommendations  -- Dietitian following, appreciate assistance.   --s/p IR gtube 3/6.  --Continue tube feeds --continue SLP efforts  Hyperglycemia-resolved as of 07/12/2021 Likely due to steroid.  Resolved.  A1c 5.2%.     DVT prophylaxis: SCD's Start: 06/29/21 1231 apixaban (ELIQUIS) tablet 5 mg    Code Status: Full Code Family Communication: No family present at bedside this morning  Disposition Plan:  Level of care: Telemetry Medical Status is: Inpatient  Remains inpatient appropriate because: Pending SNF placement, will be difficult per [facility]   Procedures:  ACDF C3-7, neurosurgery Dr. Trenton Gammon 2/3 Hematoma evacuation 2/7 Vascular duplex ultrasound bilateral lower extremities 2/19 TTE 2/20 TTE 3/1 IR G-tube placement 3/6  Antimicrobials:  Vancomycin 2/7 - 2/8; 2/18 - 2/21 Zosyn 2/21 -2/23 Ampicillin 2/7 -2/11 Metronidazole 2/7 - 2/8; 2/17 -2/18 Cefepime 2/18 - 2/21 Perioperative cefazolin  Subjective: Patient seen examined bedside, resting comfortably.  Patient with hand mitts in place, covered in feces.  Asking to be cleaned up.  No family present.  Patient denies headache, no chest pain, no shortness of breath, no abdominal pain.  No acute concerns overnight per nursing staff.  Pending SNF placement, will be difficult per discussions with TOC.  Objective: Vitals:    08/05/21 2315 08/06/21 0324 08/06/21 0739 08/06/21 1204  BP:  109/85 102/81 (!) 116/92 103/76  Pulse: 91 93 97 87  Resp: 19 19 13 18   Temp: 97.7 F (36.5 C) (!) 97.5 F (36.4 C) 99 F (37.2 C) 98.1 F (36.7 C)  TempSrc: Axillary Axillary Oral Oral  SpO2: 99% 97% 99% 100%  Weight:      Height:        Intake/Output Summary (Last 24 hours) at 08/06/2021 1429 Last data filed at 08/06/2021 1242 Gross per 24 hour  Intake 2057.16 ml  Output 2450 ml  Net -392.84 ml   Filed Weights   08/02/21 0500 08/03/21 0500 08/05/21 0500  Weight: 51.2 kg 52.8 kg 52 kg    Examination:  Physical Exam: GEN: NAD, alert, confused, cachectic/chronically ill in appearance appears older than stated age HEENT: NCAT, PERRL, EOMI, sclera clear, dry mucous membranes PULM: CTAB w/o wheezes/crackles, normal respiratory effort, on room air CV: Tachycardic, regular rhythm w/o M/G/R GI: abd soft, NTND, NABS, no R/G/M, G-tube noted with abdominal binder in place GU: Foley catheter noted draining clear yellow urine in collection bag MSK: no peripheral edema, decreased muscle strength bilateral upper/lower extremities NEURO: Awake, alert, slightly confused, decreased muscle strength bilateral upper/lower extremities but notably increased movement of right upper extremity and left lower extremity but remains weak RLE/LUE PSYCH: Depressed mood, flat affect    Data Reviewed: I have personally reviewed following labs and imaging studies  CBC: Recent Labs  Lab 07/31/21 0113 08/01/21 0536 08/04/21 0448  WBC 15.6* 12.5* 9.0  HGB 10.1* 9.9* 9.9*  HCT 30.6* 30.2* 29.5*  MCV 94.2 92.1 93.4  PLT 811* 754* 123456*   Basic Metabolic Panel: Recent Labs  Lab 07/31/21 0113 08/01/21 0536 08/02/21 0102 08/03/21 0819  NA 129* 128* 133* 132*  K 4.2 3.7 3.4* 4.1  CL 97* 97* 103 104  CO2 22 22 20* 21*  GLUCOSE 92 93 107* 101*  BUN 13 11 11 10   CREATININE 0.59* 0.59* 0.55* 0.59*  CALCIUM 9.5 9.4 9.3 9.3  MG 1.6*   --  1.6*  --   PHOS 4.0  --  3.7  --    GFR: Estimated Creatinine Clearance: 72.2 mL/min (A) (by C-G formula based on SCr of 0.59 mg/dL (L)). Liver Function Tests: Recent Labs  Lab 07/31/21 0113 08/02/21 0102  AST 46* 30  ALT 60* 37  ALKPHOS 237* 183*  BILITOT 0.8 0.6  PROT 8.1 7.4  ALBUMIN 2.5* 2.3*   No results for input(s): LIPASE, AMYLASE in the last 168 hours. No results for input(s): AMMONIA in the last 168 hours.  Coagulation Profile: No results for input(s): INR, PROTIME in the last 168 hours. Cardiac Enzymes: No results for input(s): CKTOTAL, CKMB, CKMBINDEX, TROPONINI in the last 168 hours. BNP (last 3 results) No results for input(s): PROBNP in the last 8760 hours. HbA1C: No results for input(s): HGBA1C in the last 72 hours. CBG: Recent Labs  Lab 08/05/21 2022 08/05/21 2314 08/06/21 0322 08/06/21 0741 08/06/21 1202  GLUCAP 132* 94 117* 120* 125*   Lipid Profile: No results for input(s): CHOL, HDL, LDLCALC, TRIG, CHOLHDL, LDLDIRECT in the last 72 hours. Thyroid Function Tests: No results for input(s): TSH, T4TOTAL, FREET4, T3FREE, THYROIDAB in the last 72 hours. Anemia Panel: No results for input(s): VITAMINB12, FOLATE, FERRITIN, TIBC, IRON, RETICCTPCT in the last 72 hours. Sepsis Labs: No results for input(s): PROCALCITON, LATICACIDVEN in the last 168 hours.  No results found for this or any previous visit (from the past 240 hour(s)).  Radiology Studies: No results found.      Scheduled Meds:  apixaban  5 mg Per Tube BID   bethanechol  10 mg Per Tube TID   chlorhexidine  15 mL Mouth Rinse BID   Chlorhexidine Gluconate Cloth  6 each Topical Daily   clonazepam  0.5 mg Per Tube BID   feeding supplement (PROSource TF)  45 mL Per Tube TID   folic acid  1 mg Per Tube Daily   mouth rinse  15 mL Mouth Rinse q12n4p   metoprolol tartrate  100 mg Per Tube BID   multivitamin  15 mL Per Tube Daily   nicotine  14 mg Transdermal Daily    QUEtiapine  12.5 mg Per Tube BID   thiamine  100 mg Per Tube Daily   Continuous Infusions:  sodium chloride Stopped (07/23/21 1122)   dextrose 5 % and 0.9% NaCl 75 mL/hr at 08/06/21 1242   feeding supplement (VITAL 1.5 CAL) 1,000 mL (08/05/21 1709)     LOS: 38 days    Time spent: 47 minutes spent on chart review, discussion with nursing staff, consultants, updating family and interview/physical exam; more than 50% of that time was spent in counseling and/or coordination of care.    Romain Erion J [place] DO [facility] Available via Epic secure chat 7am-7pm After these hours, please refer to coverage provider listed on [email or web address] 08/06/2021, 2:29 PM

## 2021-08-07 DIAGNOSIS — G959 Disease of spinal cord, unspecified: Secondary | ICD-10-CM | POA: Diagnosis not present

## 2021-08-07 LAB — GLUCOSE, CAPILLARY
Glucose-Capillary: 111 mg/dL — ABNORMAL HIGH (ref 70–99)
Glucose-Capillary: 100 mg/dL — ABNORMAL HIGH (ref 70–99)
Glucose-Capillary: 135 mg/dL — ABNORMAL HIGH (ref 70–99)
Glucose-Capillary: 120 mg/dL — ABNORMAL HIGH (ref 70–99)
Glucose-Capillary: 59 mg/dL — ABNORMAL LOW (ref 70–99)
Glucose-Capillary: 138 mg/dL — ABNORMAL HIGH (ref 70–99)
Glucose-Capillary: 122 mg/dL — ABNORMAL HIGH (ref 70–99)

## 2021-08-07 LAB — CBC
HCT: 29.1 % — ABNORMAL LOW (ref 39.0–52.0)
Hemoglobin: 9.9 g/dL — ABNORMAL LOW (ref 13.0–17.0)
MCH: 31.2 pg (ref 26.0–34.0)
MCHC: 34 g/dL (ref 30.0–36.0)
MCV: 91.8 fL (ref 80.0–100.0)
Platelets: 530 10*3/uL — ABNORMAL HIGH (ref 150–400)
RBC: 3.17 MIL/uL — ABNORMAL LOW (ref 4.22–5.81)
RDW: 14.5 % (ref 11.5–15.5)
WBC: 8.6 10*3/uL (ref 4.0–10.5)
nRBC: 0 % (ref 0.0–0.2)

## 2021-08-07 NOTE — Significant Event (Incomplete)
Hypoglycemic Event ? ?CBG: 59 ? ?Treatment: 8 oz juice/soda ? ?Symptoms: None ? ?Follow-up CBG: Time:*** CBG Result:*** ? ?Possible Reasons for Event: Unknown ? ?Comments/MD notified: protocol followed ? ? ? ?[place] ? ? ?

## 2021-08-07 NOTE — Progress Notes (Signed)
Patient actually looks pretty good.  He is having no pain.  His upper and lower extremity strength and dexterity continue to improve.  He is now doing fine motor movements with both hands. ? ?Patient is gradually recovering from his severe cervical myelopathy.  I really believe the patient will continue to improve and hopefully will get back home living independently. ? ?The patient's voice is strong.  He is handling his own secretions well.  His confusion is better.  I think his getting close to being fed orally again.  He continues to tolerate his tube feeds well. ? ?Overall progressing.  Continue supportive efforts.  Continue efforts at rehab/placement. ?

## 2021-08-07 NOTE — Plan of Care (Signed)
?  Problem: Education: ?Goal: Ability to verbalize activity precautions or restrictions will improve ?Outcome: Progressing ?Goal: Knowledge of the prescribed therapeutic regimen will improve ?Outcome: Progressing ?Goal: Understanding of discharge needs will improve ?Outcome: Progressing ?  ?Problem: Activity: ?Goal: Ability to avoid complications of mobility impairment will improve ?Outcome: Progressing ?Goal: Ability to tolerate increased activity will improve ?Outcome: Progressing ?Goal: Will remain free from falls ?Outcome: Progressing ?  ?Problem: Bowel/Gastric: ?Goal: Gastrointestinal status for postoperative course will improve ?Outcome: Progressing ?  ?Problem: Clinical Measurements: ?Goal: Ability to maintain clinical measurements within normal limits will improve ?Outcome: Progressing ?Goal: Postoperative complications will be avoided or minimized ?Outcome: Progressing ?Goal: Diagnostic test results will improve ?Outcome: Progressing ?  ?Problem: Pain Management: ?Goal: Pain level will decrease ?Outcome: Progressing ?  ?Problem: Skin Integrity: ?Goal: Will show signs of wound healing ?Outcome: Progressing ?  ?Problem: Health Behavior/Discharge Planning: ?Goal: Identification of resources available to assist in meeting health care needs will improve ?Outcome: Progressing ?  ?Problem: Bladder/Genitourinary: ?Goal: Urinary functional status for postoperative course will improve ?Outcome: Progressing ?  ?Problem: Safety: ?Goal: Ability to remain free from injury will improve ?Outcome: Progressing ?  ?Problem: Education: ?Goal: Knowledge of General Education information will improve ?Description: Including pain rating scale, medication(s)/side effects and non-pharmacologic comfort measures ?Outcome: Progressing ?  ?Problem: Health Behavior/Discharge Planning: ?Goal: Ability to manage health-related needs will improve ?Outcome: Progressing ?  ?Problem: Clinical Measurements: ?Goal: Ability to maintain clinical  measurements within normal limits will improve ?Outcome: Progressing ?Goal: Will remain free from infection ?Outcome: Progressing ?Goal: Diagnostic test results will improve ?Outcome: Progressing ?Goal: Respiratory complications will improve ?Outcome: Progressing ?Goal: Cardiovascular complication will be avoided ?Outcome: Progressing ?  ?Problem: Activity: ?Goal: Risk for activity intolerance will decrease ?Outcome: Progressing ?  ?Problem: Nutrition: ?Goal: Adequate nutrition will be maintained ?Outcome: Progressing ?  ?Problem: Coping: ?Goal: Level of anxiety will decrease ?Outcome: Progressing ?  ?Problem: Elimination: ?Goal: Will not experience complications related to bowel motility ?Outcome: Progressing ?Goal: Will not experience complications related to urinary retention ?Outcome: Progressing ?  ?Problem: Pain Managment: ?Goal: General experience of comfort will improve ?Outcome: Progressing ?  ?Problem: Safety: ?Goal: Ability to remain free from injury will improve ?Outcome: Progressing ?  ?Problem: Skin Integrity: ?Goal: Risk for impaired skin integrity will decrease ?Outcome: Progressing ?  ?

## 2021-08-07 NOTE — Progress Notes (Signed)
?PROGRESS NOTE ? ? ? ?Brian [name]  [id number] DOB: [birth year] DOA: 06/29/2021 ?PCP: Pcp, No  ? ? 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  He was admitted on 2/3 and underwent ACDF of C3-4, C4-5, C5-5, and C6-7.  Post-operatively he was progressing slowly with residual weakness in both hands but improving lower extremity strength and function.  He developed some difficulty swallowing on 2/5 and SLP ordered and placed on thickened liquids.   Overnight 2/6, patient with increased difficulty swallowing and was made NPO but also noted to have dysarthria and difficulty moving extremities with numbness.  SLP evaluated and found to be moderate aspiration risk.   Also progressively tachycardic, tachypneic, and now febrile.  Code stroke activated and taken for CT/ CTA head and neck and was given decadron 10mg  once.  NIHSS 18.  CTH was negative for acute findings, and CTA head neck did not reveal any LVO but noted significant prevertebral soft tissue swelling with foci of gas present at the ventral epidural space at C4-5, small collection not excluded. On 2/7, patient with worsening confusion and concern for airway involvement, PCCM consulted and remained in the intensive care unit until he is transferred to the floor on 3/2 and PCCM requested transition to [facility] for medical assistance while patient remains under the neurosurgery service. ? ?Significant Hospital Events: ?2/3 ACDF C3-4, C4-5, C5-5, and C6-7 w/ Dr. Annette Stable ?2/6 SLP eval for difficulty swallowing ?2/7 Code stroke overnight, neg for LVO, CT showing soft tissue swelling.  PCCM consulted for concern of airway  management and AMS.  Went for evacuation of hematoma  ?2/18 CT Abd/Pelvis: showing bilateral segmental Pes, started on heparin and showing worsening pneumonia, febrile to  103.  PCT rising, 34.6, restarted on abx ?2/19 possible aspiration w/ severe hypoxia; intubated; Switching from heparin to angiomax given subtherapeutic levels despite increase in rate. ?2/20 Echo shows normal LV systolic function. There was notation of a + McConnell's sign c/w a large pulmonary embolus which is consistent with the finding of bilateral segmental pulmonary emboli noted on CT 07/14/2021. ?2/21 rash appreciated on back; stopped cefepim/vanc switched to zosyn; required low dose levo with increase in fentanyl ?2/22 remains intubated; unable to extubate to do increase RR and thick secretions ?2/23 [name] infusion added ?2/24: weaning fentanyl.  ?2/26 extubated ?2/28 tachycardia persists ?3/2: Modified barium swallow, continues n.p.o. with core track in place, likely will need PEG tube. ?3/6: s/p IR G-tube placement ?Now medically clear awaiting SNF placement. ?  ? ? ?Assessment & Plan: ?  ?Assessment and Plan: ?* Cervical myelopathy ([place]); severe cervical spinal stenosis with cord compression s/p ACDF [id number] on 2/3, complicated by epidural hematoma s/p evacuation 2/7 ?Patient initially presented s/p recent fall with progressive upper and lower extremity weakness and tremors, He was found to have critical multilevel cervical spinal stenosis @ C3-3 6 with spinal cord compression and spinal cord signal change and underwent ACDF C3-4, C4-5, C5-5, and C6/7 by neurosurgery Dr. Trenton Gammon on 06/29/2021.  Postoperative leg complicated by epidural hematoma s/p evacuation on 2/7. ?--Continue PT/OT/SLP efforts, Awaiting SNF; but given improving strength may be able to progress to home with home health ?--Further as per neurosurgery recommendation. ? ?  Acute respiratory failure with hypoxia (HCC) ?Likely Multifactorial with significant dysphagia and  recurrent aspiration pneumonia events during initial hospitalization following ACDF surgery with postoperative complications of postoperative cervical hematoma.  Patient did require ventilatory support in the intensive care unit and was successfully extubated on 07/22/2021.  Patient completed extensive course of empiric antibiotics.  Oxygen now weaned off, Now on room air. ?--Continue Aspiration precautions ? ? ? ? ?Acute pulmonary embolism (HCC) ?CTA chest showed bilateral segmental PE.  LE [place] negative for DVT.   ?TTE with normal LV systolic function.  Transitioned Lovenox to Eliquis. ?Oxygen now weaned off. ? ?Acute metabolic encephalopathy ?CT head, MRI brain, EEG, TSH, B12, ammonia unrevealing.   ?Etiology likely multifactorial with acute respiratory failure sec. to aspiration pneumonia, bilateral pulmonary embolism.   ?Completed course of antibiotics. ?--Reduced Seroquel dose to 12.5 mg qHS on 3/13; continue to wean ?--Reduced clonazepam to 0.25 mg BID on 3/13; continue to wean ? ?Severe sepsis due to recurrent aspiration pneumonia ?Likely recurrent issue during hospitalization, remains n.p.o. due to continued aspiration risk per SLP.  Completed extensive course of antibiotics while under ICU care. S/p IR gtube 3/6. ?--Continue tube feeds ?--Continue aspiration precautions ?--Continue SLP efforts while inpatient ? ?Sinus tachycardia ?Multifactorial including sepsis, dehydration, PE, agitation.   ?--Continue metoprolol 100 mg twice daily ? ? ? ?Elevated liver enzymes ?Etiology likely secondary to sepsis from aspiration pneumonia as above.   ?Acute hepatitis panel negative.  RUQ ultrasound unremarkable. ?Continue monitor LFTs intermittently ? ?Hyponatremia ?Etiology likely secondary to dehydration.  Urine sodium low.  Improved with IV fluids.   Sodium now normalized on tube feeds. ? ? ?Tobacco use disorder ?Encouraged cessation. ?--Nicotine patch; decreased to 7 mg on 3/15 ? ?Physical deconditioning ?Significant  weakness in all extremities as a result of cervical myelopathy and acute illness.  Slowly improving while inpatient. ?--Continue PT/OT efforts, will need SNF placement; difficult placement per TOC but may be able to continue gradual improvement inpatient for possible transition home with home health at some point. ? ?Urinary retention ?--Continue bethanechol ?--Continue Foley catheter, ?--Continue monitor urinary output ? ?Protein-calorie malnutrition, severe (HCC) ?As evidenced by poor p.o. intake, significant muscle mass and subcu fat loss and significant weight loss (about 16 pounds since this hospitalization). ?Nutrition Problem: Severe Malnutrition (in the context of social/environmental circumstances) ?Etiology:  (inadequate energy intake) ?Signs/Symptoms: mild fat depletion, severe muscle depletion, severe muscle depletion ?Interventions: Refer to RD note for recommendations  ?--Dietitian following, appreciate assistance.   ?--s/p IR gtube 3/6.  ?--Continue tube feeds ?--continue SLP efforts ? ?Hyperglycemia-resolved as of 07/12/2021 ?Likely due to steroid.  Resolved.  A1c 5.2%. ? ? ? ? ?DVT prophylaxis: SCD's Start: 06/29/21 1231 ?apixaban (ELIQUIS) tablet 5 mg  ?  Code Status: Full Code ?Family Communication: No family present at bedside this morning ? ?Disposition Plan:  ?Level of care: Telemetry Medical ?Status is: Inpatient ? ?Remains inpatient appropriate because: Pending SNF placement, will be difficult per TOC ? ? ?Procedures:  ?ACDF C3-7, neurosurgery Dr. Dutch Quint 2/3 ?Hematoma evacuation 2/7 ?Vascular duplex ultrasound bilateral lower extremities 2/19 ?TTE 2/20 ?TTE 3/1 ?IR G-tube placement 3/6 ? ?Antimicrobials:  ?Vancomycin 2/7 - 2/8; 2/18 - 2/21 ?Zosyn 2/21 -2/23 ?Ampicillin 2/7 -2/11 ?Metronidazole 2/7 - 2/8; 2/17 -2/18 ?Cefepime 2/18 - 2/21 ?Perioperative cefazolin ? ?Subjective: ?Patient seen examined bedside, resting comfortably.  Muscle strength improving daily.  Alert and oriented this morning.   Asking when he will be able to go back to work.  No other questions  or concerns at this time. No family present.  Denies headache, no chest pain, no shortness of breath, no abdominal pain.  No acute concerns overni

## 2021-08-07 NOTE — Progress Notes (Signed)
Physical Therapy Treatment ?Patient Details ?Name: Brian [name] ?MRN: [id number] ?DOB: 1960/08/12 ?Today's Date: 08/07/2021 ? ? ?History of Present Illness Pt is a 61 y/o male admitted 06/29/21 following C3-7 ACDF after progressive cervical myelopathy symptoms including ataxia, bil UE and LE shaking and weakness, and loss of function in his hands with subsequent fall. Pt initially had improvement in neurological symptoms, but postoperative course was complicated by dysphasia and ataxia secondary to and epidural hematoma with evacuation of hematoma on 2/7. Course complicated by delirium secondary to encephalopathy, sepsis secondary to aspiration PNA, reintubated 2/19-2/26 for airway protection, bilat segmental PEs 2/18. PEG placed 3/6. PMHx includes tobacco and EtOH use disorder. ? ?  ?PT Comments  ? ? Patient received in bed, alert, agrees to PT session. Patient requires max to total assist for supine><sit. He is able to sit at edge of bed with min guard/supervision. Fatigues rather quickly with unsupported sitting. Patient will continue to benefit from skilled PT while here to improve functional independence, strength and safety. He should be able to transfer to recliner with max assist.  ?   ?Recommendations for follow up therapy are one component of a multi-disciplinary discharge planning process, led by the attending physician.  Recommendations may be updated based on patient status, additional functional criteria and insurance authorization. ? ?Follow Up Recommendations ? Skilled nursing-short term rehab (<3 hours/day) ?  ?  ?Assistance Recommended at Discharge Frequent or constant Supervision/Assistance  ?Patient can return home with the following A lot of help with bathing/dressing/bathroom;A lot of help with walking and/or transfers;Assistance with feeding ?  ?Equipment Recommendations ? Wheelchair (measurements PT);Hospital bed;Wheelchair cushion (measurements PT);BSC/3in1;Other (comment)  ?   ?Recommendations for Other Services   ? ? ?  ?Precautions / Restrictions Precautions ?Precautions: Fall ?Required Braces or Orthoses: Cervical Brace ?Cervical Brace: Soft collar;For comfort ?Restrictions ?Weight Bearing Restrictions: No  ?  ? ?Mobility ? Bed Mobility ?Overal bed mobility: Needs Assistance ?Bed Mobility: Supine to Sit, Sit to Supine ?  ?  ?Supine to sit: Max assist ?Sit to supine: Max assist ?  ?General bed mobility comments: able to sit edge of bed with close supervision. Fatigues rather quickly in sitting. <10 min ?  ? ?Transfers ?  ?  ?  ?  ?  ?  ?  ?  ?  ?  ?  ? ?Ambulation/Gait ?  ?  ?  ?  ?  ?  ?  ?  ? ? ?Stairs ?  ?  ?  ?  ?  ? ? ?Wheelchair Mobility ?  ? ?Modified Rankin (Stroke Patients Only) ?  ? ? ?  ?Balance Overall balance assessment: Needs assistance ?Sitting-balance support: Feet supported, Single extremity supported ?Sitting balance-Leahy Scale: Fair ?Sitting balance - Comments: able to sit edge of bed with close supervision. ?  ?  ?  ?  ?  ?  ?  ?  ?  ?  ?  ?  ?  ?  ?  ?  ? ?  ?Cognition Arousal/Alertness: Awake/alert ?Behavior During Therapy: Flat affect ?Overall Cognitive Status: No family/caregiver present to determine baseline cognitive functioning ?Area of Impairment: Orientation, Safety/judgement, Awareness, Problem solving ?  ?  ?  ?  ?  ?  ?  ?  ?Orientation Level: Disoriented to, Time, Situation ?Current Attention Level: Sustained ?Memory: Decreased short-term memory, Decreased recall of precautions ?Following Commands: Follows one step commands inconsistently ?Safety/Judgement: Decreased awareness of safety, Decreased awareness of deficits ?Awareness: Intellectual ?Problem Solving: Difficulty sequencing, Requires  verbal cues, Requires tactile cues, Slow processing, Decreased initiation ?  ?  ?  ? ?  ?Exercises Other Exercises ?Other Exercises: Attempted to get patient to do LE strengthening exercises, but not really successful. ? ?  ?General Comments   ?  ?  ? ?Pertinent  Vitals/Pain Pain Assessment ?Pain Assessment: Faces ?Faces Pain Scale: Hurts little more ?Breathing: normal ?Negative Vocalization: none ?Facial Expression: facial grimacing ?Body Language: relaxed ?Consolability: no need to console ?PAINAD Score: 2 ?Pain Location: grimaces with pain with certain movements when getting in/out of bed. ?Pain Descriptors / Indicators: Grimacing  ? ? ?Home Living   ?  ?  ?  ?  ?  ?  ?  ?  ?  ?   ?  ?Prior Function    ?  ?  ?   ? ?PT Goals (current goals can now be found in the care plan section) Acute Rehab PT Goals ?Patient Stated Goal: to get better and be independent, back to PLOF ?PT Goal Formulation: With patient ?Time For Goal Achievement: 08/21/21 ?Potential to Achieve Goals: Fair ?Progress towards PT goals: Progressing toward goals ? ?  ?Frequency ? ? ? Min 2X/week ? ? ? ?  ?PT Plan Current plan remains appropriate  ? ? ?Co-evaluation   ?  ?  ?  ?  ? ?  ?AM-PAC PT "6 Clicks" Mobility   ?Outcome Measure ? Help needed turning from your back to your side while in a flat bed without using bedrails?: A Lot ?Help needed moving from lying on your back to sitting on the side of a flat bed without using bedrails?: A Lot ?Help needed moving to and from a bed to a chair (including a wheelchair)?: Total ?Help needed standing up from a chair using your arms (e.g., wheelchair or bedside chair)?: Total ?Help needed to walk in hospital room?: Total ?Help needed climbing 3-5 steps with a railing? : Total ?6 Click Score: 8 ? ?  ?End of Session   ?Activity Tolerance: Patient tolerated treatment well;Patient limited by fatigue ?Patient left: in bed;with call bell/phone within reach;with bed alarm set ?Nurse Communication: Mobility status ?PT Visit Diagnosis: Muscle weakness (generalized) (M62.81);Other symptoms and signs involving the nervous system (R29.898);Difficulty in walking, not elsewhere classified (R26.2);Other abnormalities of gait and mobility (R26.89) ?  ? ? ?Time: 6945-0388 ?PT Time  Calculation (min) (ACUTE ONLY): 20 min ? ?Charges:  $Therapeutic Activity: 8-22 mins          ?          ? ?Lissa Merlin, PT, GCS ?08/07/21,12:29 PM ? ?

## 2021-08-08 DIAGNOSIS — G959 Disease of spinal cord, unspecified: Secondary | ICD-10-CM | POA: Diagnosis not present

## 2021-08-08 LAB — GLUCOSE, CAPILLARY
Glucose-Capillary: 105 mg/dL — ABNORMAL HIGH (ref 70–99)
Glucose-Capillary: 106 mg/dL — ABNORMAL HIGH (ref 70–99)
Glucose-Capillary: 119 mg/dL — ABNORMAL HIGH (ref 70–99)
Glucose-Capillary: 124 mg/dL — ABNORMAL HIGH (ref 70–99)
Glucose-Capillary: 128 mg/dL — ABNORMAL HIGH (ref 70–99)
Glucose-Capillary: 134 mg/dL — ABNORMAL HIGH (ref 70–99)

## 2021-08-08 NOTE — Progress Notes (Signed)
?PROGRESS NOTE ? ? ? ?MICK ACHTER  [id number] DOB: [birth year] DOA: 06/29/2021 ?PCP: Pcp, No  ? ? 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  He was admitted on 2/3 and underwent ACDF of C3-4, C4-5, C5-5, and C6-7.  Post-operatively he was progressing slowly with residual weakness in both hands but improving lower extremity strength and function.  He developed some difficulty swallowing on 2/5 and SLP ordered and placed on thickened liquids.   Overnight 2/6, patient with increased difficulty swallowing and was made NPO but also noted to have dysarthria and difficulty moving extremities with numbness.  SLP evaluated and found to be moderate aspiration risk.   Also progressively tachycardic, tachypneic, and now febrile.  Code stroke activated and taken for CT/ CTA head and neck and was given decadron 10mg  once.  NIHSS 18.  CTH was negative for acute findings, and CTA head neck did not reveal any LVO but noted significant prevertebral soft tissue swelling with foci of gas present at the ventral epidural space at C4-5, small collection not excluded. On 2/7, patient with worsening confusion and concern for airway involvement, PCCM consulted and remained in the intensive care unit until he is transferred to the floor on 3/2 and PCCM requested transition to [facility] for medical assistance while patient remains under the neurosurgery service. ? ?Significant Hospital Events: ?2/3 ACDF C3-4, C4-5, C5-5, and C6-7 w/ Dr. Annette Stable ?2/6 SLP eval for difficulty swallowing ?2/7 Code stroke overnight, neg for LVO, CT showing soft tissue swelling.  PCCM consulted for concern of airway  management and AMS.  Went for evacuation of hematoma  ?2/18 CT Abd/Pelvis: showing bilateral segmental Pes, started on heparin and showing worsening pneumonia, febrile to  103.  PCT rising, 34.6, restarted on abx ?2/19 possible aspiration w/ severe hypoxia; intubated; Switching from heparin to angiomax given subtherapeutic levels despite increase in rate. ?2/20 Echo shows normal LV systolic function. There was notation of a + McConnell's sign c/w a large pulmonary embolus which is consistent with the finding of bilateral segmental pulmonary emboli noted on CT 07/14/2021. ?2/21 rash appreciated on back; stopped cefepim/vanc switched to zosyn; required low dose levo with increase in fentanyl ?2/22 remains intubated; unable to extubate to do increase RR and thick secretions ?2/23 [name] infusion added ?2/24: weaning fentanyl.  ?2/26 extubated ?2/28 tachycardia persists ?3/2: Modified barium swallow, continues n.p.o. with core track in place, likely will need PEG tube. ?3/6: s/p IR G-tube placement ?Now medically clear awaiting SNF placement. ?  ? ? ?Assessment & Plan: ?  ?Assessment and Plan: ?* Cervical myelopathy ([place]); severe cervical spinal stenosis with cord compression s/p ACDF [id number] on 2/3, complicated by epidural hematoma s/p evacuation 2/7 ?Patient initially presented s/p recent fall with progressive upper and lower extremity weakness and tremors, He was found to have critical multilevel cervical spinal stenosis @ C3-3 6 with spinal cord compression and spinal cord signal change and underwent ACDF C3-4, C4-5, C5-5, and C6/7 by neurosurgery Dr. Trenton Gammon on 06/29/2021.  Postoperative leg complicated by epidural hematoma s/p evacuation on 2/7. ?--Continue PT/OT/SLP efforts, Awaiting SNF; but given improving strength may be able to progress to home with home health ?--Further as per neurosurgery recommendation. ? ?  Acute respiratory failure with hypoxia ([place]) ?Likely Multifactorial with significant dysphagia and  recurrent aspiration pneumonia events during initial hospitalization following ACDF surgery with postoperative complications of postoperative cervical hematoma.  Patient did require ventilatory support in the intensive care unit and was successfully extubated on 07/22/2021.  Patient completed extensive course of empiric antibiotics.  Oxygen now weaned off, Now on room air. ?--Continue Aspiration precautions ? ? ? ? ?Acute pulmonary embolism ([place]) ?CTA chest showed bilateral segmental PE.  LE [place] negative for DVT.   ?TTE with normal LV systolic function.  Transitioned Lovenox to Eliquis. ?Oxygen now weaned off. ? ?Acute metabolic encephalopathy ?CT head, MRI brain, EEG, TSH, B12, ammonia unrevealing.   ?Etiology likely multifactorial with acute respiratory failure sec. to aspiration pneumonia, bilateral pulmonary embolism.   ?Completed course of antibiotics. ?--Reduced Seroquel dose to 12.5 mg qHS on 3/13; continue to wean ?--Reduced clonazepam to 0.25 mg BID on 3/13; continue to wean ? ?Severe sepsis due to recurrent aspiration pneumonia ?Likely recurrent issue during hospitalization, remains n.p.o. due to continued aspiration risk per SLP.  Completed extensive course of antibiotics while under ICU care. S/p IR gtube 3/6. ?--Continue tube feeds ?--Continue aspiration precautions ?--Continue SLP efforts while inpatient ? ?Sinus tachycardia ?Multifactorial including sepsis, dehydration, PE, agitation.   ?--Continue metoprolol 100 mg twice daily ? ? ? ?Elevated liver enzymes ?Etiology likely secondary to sepsis from aspiration pneumonia as above.   ?Acute hepatitis panel negative.  RUQ ultrasound unremarkable. ?Continue monitor LFTs intermittently ? ?Hyponatremia ?Etiology likely secondary to dehydration.  Urine sodium low.  Improved with IV fluids.   Sodium now normalized on tube feeds. ? ? ?Tobacco use disorder ?Encouraged cessation. ?--Nicotine patch; decreased to 7 mg on 3/15 ? ?Physical deconditioning ?Significant  weakness in all extremities as a result of cervical myelopathy and acute illness.  Slowly improving while inpatient. ?--Continue PT/OT efforts, will need SNF placement; difficult placement per TOC but may be able to continue gradual improvement inpatient for possible transition home with home health at some point. ? ?Urinary retention ?--Continue bethanechol ?--Continue Foley catheter, ?--Continue monitor urinary output ? ?Protein-calorie malnutrition, severe ([place]) ?As evidenced by poor p.o. intake, significant muscle mass and subcu fat loss and significant weight loss (about 16 pounds since this hospitalization). ?Nutrition Problem: Severe Malnutrition (in the context of social/environmental circumstances) ?Etiology:  (inadequate energy intake) ?Signs/Symptoms: mild fat depletion, severe muscle depletion, severe muscle depletion ?Interventions: Refer to RD note for recommendations  ?--Dietitian following, appreciate assistance.   ?--s/p IR gtube 3/6.  ?--Continue tube feeds ?--continue SLP efforts ? ?Hyperglycemia-resolved as of 07/12/2021 ?Likely due to steroid.  Resolved.  A1c 5.2%. ? ? ? ? ?DVT prophylaxis: SCD's Start: 06/29/21 1231 ?apixaban (ELIQUIS) tablet 5 mg  ?  Code Status: Full Code ?Family Communication: No family present at bedside this morning ? ?Disposition Plan:  ?Level of care: Telemetry Medical ?Status is: Inpatient ? ?Remains inpatient appropriate because: Pending SNF placement, will be difficult per TOC ? ? ?Procedures:  ?ACDF C3-7, neurosurgery Dr. Trenton Gammon 2/3 ?Hematoma evacuation 2/7 ?Vascular duplex ultrasound bilateral lower extremities 2/19 ?TTE 2/20 ?TTE 3/1 ?IR G-tube placement 3/6 ? ?Antimicrobials:  ?Vancomycin 2/7 - 2/8; 2/18 - 2/21 ?Zosyn 2/21 -2/23 ?Ampicillin 2/7 -2/11 ?Metronidazole 2/7 - 2/8; 2/17 -2/18 ?Cefepime 2/18 - 2/21 ?Perioperative cefazolin ? ?Subjective: ?Patient seen examined bedside, resting comfortably.  No specific complaints this morning.  Continues with significant  weakness requiring 2+ max assist for mobility and continues on tube feeds given dysphagia.  Muscle strength  has dramatically improved since initial presentation.  No family present.  No other questions or concerns at

## 2021-08-08 NOTE — Progress Notes (Signed)
Overall stable.  No new issues or problems.  Continue efforts at therapy and reconditioning. ?

## 2021-08-08 NOTE — Progress Notes (Addendum)
Speech Language Pathology Treatment: Dysphagia  ?Patient Details ?Name: Brian [name] ?MRN: [id number] ?DOB: 1961/01/10 ?Today's Date: 08/08/2021 ?Time: [id number] ?SLP Time Calculation (min) (ACUTE ONLY): 17 min ? ?Assessment / Plan / Recommendation ?Clinical Impression ? Pt was seen for dysphagia treatment. He was alert and cooperative during the session and his mentation was improved. Pt was educated regarding the results of the modified barium swallow study, the nature of his deficits, and the purpose of dysphagia exercises. Video recording of the study was used to facilitate education; pt asked relevant questions and he was able to he independently likened the epiglottis to a door at the end of session and he was able to demonstrate some understanding using teach back. Pt tolerated ice chips without overt s/sx of aspiration and used the left head turn with verbal prompts. Pt completed effortful swallows and rated his swallows as 9-10/10 for effort. Additional trials and exercises were deferred since pt began falling asleep, but pt's overall performance was notably improved compared to prior sessions. SLP has concerns regarding variability of pt's mentation and the impact that this has had on swallow function. However, if pt's mentation is consistently closer to that noted today, SLP is hopeful that a p.o. diet could be initiated with observance of swallowing precautions. Pt's case discussed with neurosurgery (Dr. Annette Stable) who advised that there are currently no neurosurgical contraindications with pt's use of a head turn. SLP will continue to follow pt.   ?  ?HPI HPI: Pt is a 61 y/o admitted 06/29/21 following C3-7 ACDF after progressive cervical myelopathy symptoms with subsequent fall and severe incomplete SCI. Significant prevertebral swelling per imaging. Pt developed some difficulty swallowing on 2/5 and SLP consulted. MBS 2/6 revealed severe edema s/p ACDF, oral holding, a pharyngeal delay, inconsistent  hyolaryngeal elevation, incomplete epiglottic inversion due to edema. Aspiration noted accross consistencies and and NPO status was recommended. Repeat MBS 2/10 showed significantly reduced pharyngeal edema and a dysphagia 2 diet with nectar thick liquids was initiated. Pt transitioned to thin liquids 2/16 and susbequently returned to nectar thick 2/17 due to concern for aspiration. Pt developed pna, possible aspiration, requiring intubation 2/19-26. Cortrak placed 2/20. PEG placed in IR on 3/6. No significant PMH. ?  ?   ?SLP Plan ? Continue with current plan of care ? ?  ?  ?Recommendations for follow up therapy are one component of a multi-disciplinary discharge planning process, led by the attending physician.  Recommendations may be updated based on patient status, additional functional criteria and insurance authorization. ?  ? ?Recommendations  ?Diet recommendations: NPO ?Medication Administration: Via alternative means  ?   ?    ?   ? ? ? ? Oral Care Recommendations: Oral care QID;Oral care prior to ice chip/H20 ?Follow Up Recommendations: Skilled nursing-short term rehab (<3 hours/day) ?Assistance recommended at discharge: Frequent or constant Supervision/Assistance ?SLP Visit Diagnosis: Dysphagia, pharyngeal phase (R13.13) ?Plan: Continue with current plan of care ? ? ? ? ?  ?  ?Gail Creekmore I. Hardin Negus, [place], CCC-SLP ?Acute Rehabilitation Services ?Office number [phone] ?Pager [phone] ? ? ?Brian [name] ? ?08/08/2021, 12:15 PM ? ?    ?

## 2021-08-09 DIAGNOSIS — G959 Disease of spinal cord, unspecified: Secondary | ICD-10-CM | POA: Diagnosis not present

## 2021-08-09 LAB — GLUCOSE, CAPILLARY
Glucose-Capillary: 130 mg/dL — ABNORMAL HIGH (ref 70–99)
Glucose-Capillary: 128 mg/dL — ABNORMAL HIGH (ref 70–99)
Glucose-Capillary: 100 mg/dL — ABNORMAL HIGH (ref 70–99)
Glucose-Capillary: 166 mg/dL — ABNORMAL HIGH (ref 70–99)
Glucose-Capillary: 113 mg/dL — ABNORMAL HIGH (ref 70–99)
Glucose-Capillary: 127 mg/dL — ABNORMAL HIGH (ref 70–99)

## 2021-08-09 MED ORDER — FREE WATER
165.0000 mL | Status: DC
  Administered 2021-08-09 – 2021-08-13 (×24): 165 mL

## 2021-08-09 NOTE — Progress Notes (Signed)
Physical Therapy Treatment ?Patient Details ?Name: Brian [name] ?MRN: [id number] ?DOB: 03/26/1961 ?Today's Date: 08/09/2021 ? ? ?History of Present Illness Pt is a 61 y/o male admitted 06/29/21 following C3-7 ACDF after progressive cervical myelopathy symptoms including ataxia, bil UE and LE shaking and weakness, and loss of function in his hands with subsequent fall. Pt initially had improvement in neurological symptoms, but postoperative course was complicated by dysphasia and ataxia secondary to and epidural hematoma with evacuation of hematoma on 2/7. Course complicated by delirium secondary to encephalopathy, sepsis secondary to aspiration PNA, reintubated 2/19-2/26 for airway protection, bilat segmental PEs 2/18. PEG placed 3/6. PMHx includes tobacco and EtOH use disorder. ? ?  ?PT Comments  ? ? Pt awake and alert upon PT arrival to room, per RN pt has been restless this am. Pt demonstrating improving LUE and bilat LE strength today, tolerating repeated sit<>stands in stedy with min-mod assist only. Pt up in chair upon PT exit with OT arriving to room, progressing well. Will continue to follow.  ?   ?Recommendations for follow up therapy are one component of a multi-disciplinary discharge planning process, led by the attending physician.  Recommendations may be updated based on patient status, additional functional criteria and insurance authorization. ? ?Follow Up Recommendations ? Skilled nursing-short term rehab (<3 hours/day) ?  ?  ?Assistance Recommended at Discharge Frequent or constant Supervision/Assistance  ?Patient can return home with the following A lot of help with bathing/dressing/bathroom;A lot of help with walking and/or transfers;Assistance with feeding ?  ?Equipment Recommendations ? Wheelchair (measurements PT);Hospital bed;Wheelchair cushion (measurements PT);BSC/3in1;Other (comment)  ?  ?Recommendations for Other Services   ? ? ?  ?Precautions / Restrictions Precautions ?Precautions:  Fall ?Required Braces or Orthoses: Cervical Brace (not present in room) ?Cervical Brace: Soft collar;For comfort ?Restrictions ?Weight Bearing Restrictions: No  ?  ? ?Mobility ? Bed Mobility ?Overal bed mobility: Needs Assistance ?Bed Mobility: Supine to Sit, Sit to Supine ?  ?  ?Supine to sit: Mod assist, +2 for physical assistance ?  ?  ?General bed mobility comments: Mod +2 for LE progression to EOB, min truncal boosting but pt using UEs well to sit up, and assist for scooting to EOB with assist of lateral leaning and bed pad. Sequencing cues throughout ?  ? ?Transfers ?Overall transfer level: Needs assistance ?Equipment used: 2 person hand held assist, Ambulation equipment used ?Transfers: Sit to/from Stand, Bed to chair/wheelchair/BSC ?Sit to Stand: Mod assist, +2 physical assistance, Min assist ?  ?  ?Squat pivot transfers: Mod assist, +2 physical assistance ?  ?  ?General transfer comment: mod +2 for squat pivot towards L for truncal boost, hip translation. mod +2 for stand from low surface for hip rise, hip extension via posterior facilitation, and steadying upon standing. PT placing pt's hands on bar of stedy for pt to assist with boost up, easier for pt with L hand vs R. min assist for power up and steady from stedy seat, repeated stands from stedy seat x4. ?Transfer via Lift Equipment: Stedy ? ?Ambulation/Gait ?  ?  ?  ?  ?  ?  ?  ?  ? ? ?Stairs ?  ?  ?  ?  ?  ? ? ?Wheelchair Mobility ?  ? ?Modified Rankin (Stroke Patients Only) ?  ? ? ?  ?Balance Overall balance assessment: Needs assistance ?Sitting-balance support: Feet supported, Single extremity supported ?Sitting balance-Leahy Scale: Fair ?Sitting balance - Comments: preference for posterior leaning, cues for anterior leaning, use of  LUE on bedrail vs HHA of PT ?Postural control: Posterior lean ?Standing balance support: Bilateral upper extremity supported ?Standing balance-Leahy Scale: Poor ?  ?  ?  ?  ?  ?  ?  ?  ?  ?  ?  ?  ?  ? ?  ?Cognition  Arousal/Alertness: Awake/alert ?Behavior During Therapy: Flat affect ?Overall Cognitive Status: No family/caregiver present to determine baseline cognitive functioning ?Area of Impairment: Orientation, Safety/judgement, Awareness, Problem solving ?  ?  ?  ?  ?  ?  ?  ?  ?Orientation Level: Disoriented to, Time, Situation ?Current Attention Level: Sustained ?Memory: Decreased short-term memory, Decreased recall of precautions ?Following Commands: Follows one step commands inconsistently ?Safety/Judgement: Decreased awareness of safety, Decreased awareness of deficits ?Awareness: Intellectual ?Problem Solving: Difficulty sequencing, Requires verbal cues, Requires tactile cues, Slow processing, Decreased initiation ?General Comments: pt states it is April 2022, aware of self, location, and reason for admission. Periods of tangential statements, can be difficult to follow and goes in and out of confusion regarding current situation. ?  ?  ? ?  ?Exercises   ? ?  ?General Comments   ?  ?  ? ?Pertinent Vitals/Pain Pain Assessment ?Pain Assessment: No/denies pain  ? ? ?Home Living   ?  ?  ?  ?  ?  ?  ?  ?  ?  ?   ?  ?Prior Function    ?  ?  ?   ? ?PT Goals (current goals can now be found in the care plan section) Acute Rehab PT Goals ?Patient Stated Goal: to get better and be independent, back to PLOF ?PT Goal Formulation: With patient ?Time For Goal Achievement: 08/21/21 ?Potential to Achieve Goals: Fair ?Progress towards PT goals: Progressing toward goals ? ?  ?Frequency ? ? ? Min 2X/week ? ? ? ?  ?PT Plan Current plan remains appropriate  ? ? ?Co-evaluation   ?  ?  ?  ?  ? ?  ?AM-PAC PT "6 Clicks" Mobility   ?Outcome Measure ? Help needed turning from your back to your side while in a flat bed without using bedrails?: A Lot ?Help needed moving from lying on your back to sitting on the side of a flat bed without using bedrails?: A Lot ?Help needed moving to and from a bed to a chair (including a wheelchair)?: A Lot ?Help  needed standing up from a chair using your arms (e.g., wheelchair or bedside chair)?: A Lot ?Help needed to walk in hospital room?: Total ?Help needed climbing 3-5 steps with a railing? : Total ?6 Click Score: 10 ? ?  ?End of Session Equipment Utilized During Treatment: Gait belt ?Activity Tolerance: Patient tolerated treatment well ?Patient left: in chair;with call bell/phone within reach;Other (comment) (with OT Rick at bedside) ?Nurse Communication: Mobility status ?PT Visit Diagnosis: Muscle weakness (generalized) (M62.81);Other symptoms and signs involving the nervous system (R29.898);Difficulty in walking, not elsewhere classified (R26.2);Other abnormalities of gait and mobility (R26.89) ?  ? ? ?Time: [id number] ?PT Time Calculation (min) (ACUTE ONLY): 35 min ? ?Charges:  $Therapeutic Activity: 8-22 mins ?$Neuromuscular Re-education: 8-22 mins          ?          ? ?Stacie Glaze, PT DPT ?Acute Rehabilitation Services ?Pager [phone]  ?Office [phone] ? ? ? ?Sheniece Ruggles E Stroup ?08/09/2021, 1:07 PM ? ?

## 2021-08-09 NOTE — Progress Notes (Signed)
Occupational Therapy Treatment Patient Details Name: Brian [name] MRN: [id number] DOB: Mar 23, 1961 Today's Date: 08/09/2021   History of present illness Pt is a 61 y/o male admitted 06/29/21 following C3-7 ACDF after progressive cervical myelopathy symptoms including ataxia, bil UE and LE shaking and weakness, and loss of function in his hands with subsequent fall. Pt initially had improvement in neurological symptoms, but postoperative course was complicated by dysphasia and ataxia secondary to and epidural hematoma with evacuation of hematoma on 2/7. Course complicated by delirium secondary to encephalopathy, sepsis secondary to aspiration PNA, reintubated 2/19-2/26 for airway protection, bilat segmental PEs 2/18. PEG placed 3/6. PMHx includes tobacco and EtOH use disorder.   OT comments  Patient up in recliner following PT treatment.  Patient performed light grooming with washing face with patient unable to bathe forehead.  Patient washed hands with mod assist to complete. Patient instructed in AAROM for shoulder flexion and extension and AROM for BUE elbow flexion/extension.  Patient provided squeeze ball and was able to squeeze x10 on each hand.  Patient performed grasp release with dropping ball into therapist hand and picking up.  Patient became lethargic during treatment and was max assist to transfer back to bed. Acute OT to continue to follow.    Recommendations for follow up therapy are one component of a multi-disciplinary discharge planning process, led by the attending physician.  Recommendations may be updated based on patient status, additional functional criteria and insurance authorization.    Follow Up Recommendations  Skilled nursing-short term rehab (<3 hours/day)    Assistance Recommended at Discharge Frequent or constant Supervision/Assistance  Patient can return home with the following  Two people to help with walking and/or transfers;Direct supervision/assist for  medications management;Assist for transportation;Help with stairs or ramp for entrance;Assistance with feeding;Two people to help with bathing/dressing/bathroom;Assistance with cooking/housework;Direct supervision/assist for Personal assistant (measurements OT);Wheelchair cushion (measurements OT);Hospital bed;BSC/3in1;Tub/shower seat    Recommendations for Other Services      Precautions / Restrictions Precautions Precautions: Fall Required Braces or Orthoses: Cervical Brace (not present in room) Cervical Brace: Soft collar;For comfort Restrictions Weight Bearing Restrictions: No       Mobility Bed Mobility Overal bed mobility: Needs Assistance Bed Mobility: Sit to Supine       Sit to supine: Max assist   General bed mobility comments: required assistance with BLE and trunk to return to supine    Transfers Overall transfer level: Needs assistance Equipment used: None Transfers: Sit to/from Stand, Bed to chair/wheelchair/BSC Sit to Stand: Max assist   Squat pivot transfers: Max assist       General transfer comment: squat pivot transfer to bed due to fatigue from PT     Balance Overall balance assessment: Needs assistance Sitting-balance support: Feet supported, Single extremity supported Sitting balance-Leahy Scale: Fair Sitting balance - Comments: seated up in recliner supported                                   ADL either performed or assessed with clinical judgement   ADL Overall ADL's : Needs assistance/impaired     Grooming: Wash/dry hands;Minimal assistance;Sitting Grooming Details (indicate cue type and reason): able to wash face but not forhead                               General ADL Comments:  able to perform face washing seated in recliner    Extremity/Trunk Assessment Upper Extremity Assessment RUE Deficits / Details: able to bring hand to mouth and perform active finger  flexion and extension.  improved gross grasp RUE Sensation: decreased light touch;decreased proprioception RUE Coordination: decreased fine motor;decreased gross motor LUE Deficits / Details: able to bring hand to mouth and increased finger flexion and extension LUE Sensation: decreased light touch LUE Coordination: decreased fine motor;decreased gross motor            Vision       Perception     Praxis      Cognition Arousal/Alertness: Awake/alert, Lethargic (became lethargic at end of session) Behavior During Therapy: Flat affect Overall Cognitive Status: No family/caregiver present to determine baseline cognitive functioning Area of Impairment: Orientation, Safety/judgement, Awareness, Problem solving                 Orientation Level: Disoriented to, Time, Situation Current Attention Level: Sustained Memory: Decreased short-term memory, Decreased recall of precautions Following Commands: Follows one step commands inconsistently Safety/Judgement: Decreased awareness of safety, Decreased awareness of deficits Awareness: Intellectual Problem Solving: Difficulty sequencing, Requires verbal cues, Requires tactile cues, Slow processing, Decreased initiation General Comments: able to follow directions for AROM exercises        Exercises Exercises: General Upper Extremity General Exercises - Upper Extremity Shoulder Flexion: AAROM, Both, 10 reps, Seated Shoulder Extension: AAROM, Both, 10 reps, Seated Elbow Flexion: AROM, Both, 10 reps, Seated Elbow Extension: AROM, Both, 10 reps, Seated Digit Composite Flexion: AROM, Both, 10 reps, Seated Composite Extension: AROM, Both, 10 reps, Seated Other Exercises Other Exercises: grasp release exercises with squeeze ball    Shoulder Instructions       General Comments      Pertinent Vitals/ Pain       Pain Assessment Pain Assessment: Faces Faces Pain Scale: No hurt Pain Intervention(s): Monitored during  session  Home Living                                          Prior Functioning/Environment              Frequency  Min 2X/week        Progress Toward Goals  OT Goals(current goals can now be found in the care plan section)  Progress towards OT goals: Progressing toward goals  Acute Rehab OT Goals Patient Stated Goal: get better OT Goal Formulation: With patient Time For Goal Achievement: 08/20/21 Potential to Achieve Goals: Fair ADL Goals Pt Will Perform Eating: with min assist;with assist to don/doff brace/orthosis;with adaptive utensils;sitting Pt Will Perform Grooming: sitting;with min assist Pt Will Perform Upper Body Bathing: with mod assist;sitting Pt Will Transfer to Toilet: with min assist;stand pivot transfer;bedside commode Pt/caregiver will Perform Home Exercise Program: Increased ROM;Increased strength;Both right and left upper extremity;With theraputty;With minimal assist;With written HEP provided Additional ADL Goal #1: Pt will grasp objects with R hand and bring to mouth 10/10 trials in prep for self feeding Additional ADL Goal #2: Pt will grasp/release 5/5 objects with L hand to increase indep with ADLs  Plan Discharge plan remains appropriate    Co-evaluation                 AM-PAC OT "6 Clicks" Daily Activity     Outcome Measure   Help from another person eating meals?: Total Help from another  person taking care of personal grooming?: A Lot Help from another person toileting, which includes using toliet, bedpan, or urinal?: Total Help from another person bathing (including washing, rinsing, drying)?: Total Help from another person to put on and taking off regular upper body clothing?: Total Help from another person to put on and taking off regular lower body clothing?: Total 6 Click Score: 7    End of Session    OT Visit Diagnosis: Other abnormalities of gait and mobility (R26.89);Muscle weakness (generalized)  (M62.81);Feeding difficulties (R63.3)   Activity Tolerance Patient limited by lethargy   Patient Left in bed;with call bell/phone within reach;with bed alarm set   Nurse Communication Mobility status        Time: 4696-2952 OT Time Calculation (min): 29 min  Charges: OT General Charges $OT Visit: 1 Visit OT Treatments $Therapeutic Exercise: 23-37 mins  Alfonse Flavors, OTA Acute Rehabilitation Services  Pager [phone] Office [phone]   Dewain Penning 08/09/2021, 1:58 PM

## 2021-08-09 NOTE — Plan of Care (Signed)
?  Problem: Education: ?Goal: Ability to verbalize activity precautions or restrictions will improve ?Outcome: Progressing ?Goal: Knowledge of the prescribed therapeutic regimen will improve ?Outcome: Progressing ?Goal: Understanding of discharge needs will improve ?Outcome: Progressing ?  ?Problem: Skin Integrity: ?Goal: Risk for impaired skin integrity will decrease ?Outcome: Progressing ?  ?Problem: Safety: ?Goal: Ability to remain free from injury will improve ?Outcome: Progressing ?  ?

## 2021-08-09 NOTE — Progress Notes (Signed)
?PROGRESS NOTE ? ? ? ?Brian [name]  [id number] DOB: 26-May-1961 DOA: 06/29/2021 ?PCP: Pcp, No  ? ? 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  He was admitted on 2/3 and underwent ACDF of C3-4, C4-5, C5-5, and C6-7.  Post-operatively he was progressing slowly with residual weakness in both hands but improving lower extremity strength and function.  He developed some difficulty swallowing on 2/5 and SLP ordered and placed on thickened liquids.   Overnight 2/6, patient with increased difficulty swallowing and was made NPO but also noted to have dysarthria and difficulty moving extremities with numbness.  SLP evaluated and found to be moderate aspiration risk.   Also progressively tachycardic, tachypneic, and now febrile.  Code stroke activated and taken for CT/ CTA head and neck and was given decadron 10mg  once.  NIHSS 18.  CTH was negative for acute findings, and CTA head neck did not reveal any LVO but noted significant prevertebral soft tissue swelling with foci of gas present at the ventral epidural space at C4-5, small collection not excluded. On 2/7, patient with worsening confusion and concern for airway involvement, PCCM consulted and remained in the intensive care unit until he is transferred to the floor on 3/2 and PCCM requested transition to [facility] for medical assistance while patient remains under the neurosurgery service. ? ?Significant Hospital Events: ?2/3 ACDF C3-4, C4-5, C5-5, and C6-7 w/ Dr. Annette Stable ?2/6 SLP eval for difficulty swallowing ?2/7 Code stroke overnight, neg for LVO, CT showing soft tissue swelling.  PCCM consulted for concern of airway  management and AMS.  Went for evacuation of hematoma  ?2/18 CT Abd/Pelvis: showing bilateral segmental Pes, started on heparin and showing worsening pneumonia, febrile to  103.  PCT rising, 34.6, restarted on abx ?2/19 possible aspiration w/ severe hypoxia; intubated; Switching from heparin to angiomax given subtherapeutic levels despite increase in rate. ?2/20 Echo shows normal LV systolic function. There was notation of a + McConnell's sign c/w a large pulmonary embolus which is consistent with the finding of bilateral segmental pulmonary emboli noted on CT 07/14/2021. ?2/21 rash appreciated on back; stopped cefepim/vanc switched to zosyn; required low dose levo with increase in fentanyl ?2/22 remains intubated; unable to extubate to do increase RR and thick secretions ?2/23 [name] infusion added ?2/24: weaning fentanyl.  ?2/26 extubated ?2/28 tachycardia persists ?3/2: Modified barium swallow, continues n.p.o. with core track in place, likely will need PEG tube. ?3/6: s/p IR G-tube placement ?Now medically clear awaiting SNF placement. ?  ? ? ?Assessment & Plan: ?  ?Assessment and Plan: ?* Cervical myelopathy ([place]); severe cervical spinal stenosis with cord compression s/p ACDF [id number] on 2/3, complicated by epidural hematoma s/p evacuation 2/7 ?Patient initially presented s/p recent fall with progressive upper and lower extremity weakness and tremors, He was found to have critical multilevel cervical spinal stenosis @ C3-3 6 with spinal cord compression and spinal cord signal change and underwent ACDF C3-4, C4-5, C5-5, and C6/7 by neurosurgery Dr. Trenton Gammon on 06/29/2021.  Postoperative leg complicated by epidural hematoma s/p evacuation on 2/7. ?--Continue PT/OT/SLP efforts, Awaiting SNF; but given improving strength may be able to progress to home with home health ?--Further as per neurosurgery recommendation. ? ?  Acute respiratory failure with hypoxia ([place]) ?Likely Multifactorial with significant dysphagia and  recurrent aspiration pneumonia events during initial hospitalization following ACDF surgery with postoperative complications of postoperative cervical hematoma.  Patient did require ventilatory support in the intensive care unit and was successfully extubated on 07/22/2021.  Patient completed extensive course of empiric antibiotics.  Oxygen now weaned off, Now on room air. ?--Continue Aspiration precautions ? ? ? ? ?Acute pulmonary embolism ([place]) ?CTA chest showed bilateral segmental PE.  LE [place] negative for DVT.   ?TTE with normal LV systolic function.  Transitioned Lovenox to Eliquis. ?Oxygen now weaned off. ? ?Acute metabolic encephalopathy ?CT head, MRI brain, EEG, TSH, B12, ammonia unrevealing.   ?Etiology likely multifactorial with acute respiratory failure sec. to aspiration pneumonia, bilateral pulmonary embolism.   ?Completed course of antibiotics. ?--Discontinue Seroquel on 3/16 ?--Reduced clonazepam to 0.25 mg BID on 3/13; continue to wean ? ?Severe sepsis due to recurrent aspiration pneumonia ?Likely recurrent issue during hospitalization, remains n.p.o. due to continued aspiration risk per SLP.  Completed extensive course of antibiotics while under ICU care. S/p IR gtube 3/6. ?--Continue tube feeds ?--Continue aspiration precautions ?--Continue SLP efforts while inpatient ? ?Sinus tachycardia ?Multifactorial including sepsis, dehydration, PE, agitation.   ?--Continue metoprolol 100 mg twice daily ? ? ? ?Elevated liver enzymes ?Etiology likely secondary to sepsis from aspiration pneumonia as above.  Acute hepatitis panel negative.  RUQ ultrasound unremarkable. ?Continue monitor LFTs intermittently ? ?Hyponatremia ?Etiology likely secondary to dehydration.  Urine sodium low.  Improved with IV fluids.   Sodium now normalized on tube feeds. ? ? ?Tobacco use disorder ?Encouraged cessation. ?--Nicotine patch; decreased to 7 mg on 3/15 ? ?Physical deconditioning ?Significant weakness in all extremities as a  result of cervical myelopathy and acute illness.  Slowly improving while inpatient. ?--Continue PT/OT efforts, will need SNF placement; difficult placement per TOC but may be able to continue gradual improvement inpatient for possible transition home with home health at some point. ? ?Urinary retention ?--Continue bethanechol ?--Attempt voiding trial today, 3/16 ?--Bladder scan and next void or if no void in 6 hours ?--Continue monitor urinary output ? ?Protein-calorie malnutrition, severe ([place]) ?As evidenced by poor p.o. intake, significant muscle mass and subcu fat loss and significant weight loss (about 16 pounds since this hospitalization). ?Nutrition Problem: Severe Malnutrition (in the context of social/environmental circumstances) ?Etiology:  (inadequate energy intake) ?Signs/Symptoms: mild fat depletion, severe muscle depletion, severe muscle depletion ?Interventions: Refer to RD note for recommendations  ?--Dietitian following, appreciate assistance.   ?--s/p IR gtube 3/6.  ?--Continue tube feeds ?--continue SLP efforts ? ?Hyperglycemia-resolved as of 07/12/2021 ?Likely due to steroid.  Resolved.  A1c 5.2%. ? ? ? ? ?DVT prophylaxis: SCD's Start: 06/29/21 1231 ?apixaban (ELIQUIS) tablet 5 mg  ?  Code Status: Full Code ?Family Communication: No family present at bedside this morning ? ?Disposition Plan:  ?Level of care: Telemetry Medical ?Status is: Inpatient ? ?Remains inpatient appropriate because: Pending SNF placement, will be difficult per TOC ? ? ?Procedures:  ?ACDF C3-7, neurosurgery Dr. Trenton Gammon 2/3 ?Hematoma evacuation 2/7 ?Vascular duplex ultrasound bilateral lower extremities 2/19 ?TTE 2/20 ?TTE 3/1 ?IR G-tube placement 3/6 ? ?Antimicrobials:  ?Vancomycin 2/7 - 2/8; 2/18 - 2/21 ?Zosyn 2/21 -2/23 ?Ampicillin 2/7 -2/11 ?Metronidazole 2/7 - 2/8; 2/17 -2/18 ?Cefepime 2/18 - 2/21 ?Perioperative cefazolin ? ?Subjective: ?Patient seen examined bedside, resting comfortably.  RN present.  No specific complaints  this morning.  Weakness continues to gradually improve.  Discussed with patient and RN that  we will attempt of voiding trial today with Foley catheter removal.  No family present.  No other questions or concerns at [facility]

## 2021-08-09 NOTE — Progress Notes (Signed)
No new issues or problems.  Patient otherwise stable.  Continue supportive efforts and continue working toward placement. ?

## 2021-08-09 NOTE — Progress Notes (Signed)
Brief nutrition note ? ?RD received page from MD requesting free water flushes be added to TF regimen as IVF were to be discontinued. RD placed order for 153ml free water Q4H which will provide a total of 1981ml free water per day with current TF regimen. ? ? ?Theone Stanley., MS, RD, LDN (she/her/hers) ?RD pager number and weekend/on-call pager number located in [place]. ? ?

## 2021-08-09 NOTE — Progress Notes (Signed)
At 1530  patient was found on floor at side of bed by staff. Alarm was sounding, bed rails up x 3. Vitals taken , patient denies pain, Md on call notified. No new orders at this time. ?

## 2021-08-10 DIAGNOSIS — G959 Disease of spinal cord, unspecified: Secondary | ICD-10-CM | POA: Diagnosis not present

## 2021-08-10 LAB — GLUCOSE, CAPILLARY
Glucose-Capillary: 121 mg/dL — ABNORMAL HIGH (ref 70–99)
Glucose-Capillary: 104 mg/dL — ABNORMAL HIGH (ref 70–99)
Glucose-Capillary: 140 mg/dL — ABNORMAL HIGH (ref 70–99)
Glucose-Capillary: 115 mg/dL — ABNORMAL HIGH (ref 70–99)
Glucose-Capillary: 114 mg/dL — ABNORMAL HIGH (ref 70–99)
Glucose-Capillary: 122 mg/dL — ABNORMAL HIGH (ref 70–99)

## 2021-08-10 LAB — COMPREHENSIVE METABOLIC PANEL
ALT: 67 U/L — ABNORMAL HIGH (ref 0–44)
AST: 58 U/L — ABNORMAL HIGH (ref 15–41)
Albumin: 2.7 g/dL — ABNORMAL LOW (ref 3.5–5.0)
Alkaline Phosphatase: 202 U/L — ABNORMAL HIGH (ref 38–126)
Anion gap: 8 (ref 5–15)
BUN: 16 mg/dL (ref 6–20)
CO2: 26 mmol/L (ref 22–32)
Calcium: 9.7 mg/dL (ref 8.9–10.3)
Chloride: 94 mmol/L — ABNORMAL LOW (ref 98–111)
Creatinine, Ser: 0.57 mg/dL — ABNORMAL LOW (ref 0.61–1.24)
GFR, Estimated: >60 mL/min (ref >60)
Glucose, Bld: 113 mg/dL — ABNORMAL HIGH (ref 70–99)
Potassium: 4.1 mmol/L (ref 3.5–5.1)
Sodium: 128 mmol/L — ABNORMAL LOW (ref 135–145)
Total Bilirubin: 0.5 mg/dL (ref 0.3–1.2)
Total Protein: 8.1 g/dL (ref 6.5–8.1)

## 2021-08-10 LAB — CBC
HCT: 28.4 % — ABNORMAL LOW (ref 39.0–52.0)
Hemoglobin: 9.2 g/dL — ABNORMAL LOW (ref 13.0–17.0)
MCH: 30 pg (ref 26.0–34.0)
MCHC: 32.4 g/dL (ref 30.0–36.0)
MCV: 92.5 fL (ref 80.0–100.0)
Platelets: 562 10*3/uL — ABNORMAL HIGH (ref 150–400)
RBC: 3.07 MIL/uL — ABNORMAL LOW (ref 4.22–5.81)
RDW: 14.7 % (ref 11.5–15.5)
WBC: 12.2 10*3/uL — ABNORMAL HIGH (ref 4.0–10.5)
nRBC: 0 % (ref 0.0–0.2)

## 2021-08-10 LAB — MAGNESIUM: Magnesium: 1.5 mg/dL — ABNORMAL LOW (ref 1.7–2.4)

## 2021-08-10 LAB — PHOSPHORUS: Phosphorus: 3.9 mg/dL (ref 2.5–4.6)

## 2021-08-10 MED ORDER — MAGNESIUM SULFATE 4 GM/100ML IV SOLN
4.0000 g | Freq: Once | INTRAVENOUS | Status: AC
  Administered 2021-08-10: 4 g via INTRAVENOUS
  Filled 2021-08-10: qty 100

## 2021-08-10 NOTE — Progress Notes (Signed)
Patient continues to make slow steady progress.  Continue supportive efforts.  No new change in plans. ?

## 2021-08-10 NOTE — Progress Notes (Signed)
?PROGRESS NOTE ? ? ? ?Brian [name]  [id number] DOB: [birth year] DOA: 06/29/2021 ?PCP: Pcp, No  ? ? 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  He was admitted on 2/3 and underwent ACDF of C3-4, C4-5, C5-5, and C6-7.  Post-operatively he was progressing slowly with residual weakness in both hands but improving lower extremity strength and function.  He developed some difficulty swallowing on 2/5 and SLP ordered and placed on thickened liquids.   Overnight 2/6, patient with increased difficulty swallowing and was made NPO but also noted to have dysarthria and difficulty moving extremities with numbness.  SLP evaluated and found to be moderate aspiration risk.   Also progressively tachycardic, tachypneic, and now febrile.  Code stroke activated and taken for CT/ CTA head and neck and was given decadron 10mg  once.  NIHSS 18.  CTH was negative for acute findings, and CTA head neck did not reveal any LVO but noted significant prevertebral soft tissue swelling with foci of gas present at the ventral epidural space at C4-5, small collection not excluded. On 2/7, patient with worsening confusion and concern for airway involvement, PCCM consulted and remained in the intensive care unit until he is transferred to the floor on 3/2 and PCCM requested transition to [facility] for medical assistance while patient remains under the neurosurgery service. ? ?Significant Hospital Events: ?2/3 ACDF C3-4, C4-5, C5-5, and C6-7 w/ Dr. Annette Stable ?2/6 SLP eval for difficulty swallowing ?2/7 Code stroke overnight, neg for LVO, CT showing soft tissue swelling.  PCCM consulted for concern of airway  management and AMS.  Went for evacuation of hematoma  ?2/18 CT Abd/Pelvis: showing bilateral segmental Pes, started on heparin and showing worsening pneumonia, febrile to  103.  PCT rising, 34.6, restarted on abx ?2/19 possible aspiration w/ severe hypoxia; intubated; Switching from heparin to angiomax given subtherapeutic levels despite increase in rate. ?2/20 Echo shows normal LV systolic function. There was notation of a + McConnell's sign c/w a large pulmonary embolus which is consistent with the finding of bilateral segmental pulmonary emboli noted on CT 07/14/2021. ?2/21 rash appreciated on back; stopped cefepim/vanc switched to zosyn; required low dose levo with increase in fentanyl ?2/22 remains intubated; unable to extubate to do increase RR and thick secretions ?2/23 [name] infusion added ?2/24: weaning fentanyl.  ?2/26 extubated ?2/28 tachycardia persists ?3/2: Modified barium swallow, continues n.p.o. with core track in place, likely will need PEG tube. ?3/6: s/p IR G-tube placement ?Now medically clear awaiting SNF placement. ?  ? ? ?Assessment & Plan: ?  ?Assessment and Plan: ?* Cervical myelopathy ([place]); severe cervical spinal stenosis with cord compression s/p ACDF [id number] on 2/3, complicated by epidural hematoma s/p evacuation 2/7 ?Patient initially presented s/p recent fall with progressive upper and lower extremity weakness and tremors, He was found to have critical multilevel cervical spinal stenosis @ C3-3 6 with spinal cord compression and spinal cord signal change and underwent ACDF C3-4, C4-5, C5-5, and C6/7 by neurosurgery Dr. Trenton Gammon on 06/29/2021.  Postoperative leg complicated by epidural hematoma s/p evacuation on 2/7. ?--Continue PT/OT/SLP efforts, Awaiting SNF; but given improving strength may be able to progress to home with home health ?--Further as per neurosurgery recommendation. ? ?  Acute respiratory failure with hypoxia ([place]) ?Likely Multifactorial with significant dysphagia and  recurrent aspiration pneumonia events during initial hospitalization following ACDF surgery with postoperative complications of postoperative cervical hematoma.  Patient did require ventilatory support in the intensive care unit and was successfully extubated on 07/22/2021.  Patient completed extensive course of empiric antibiotics.  Oxygen now weaned off, Now on room air. ?--Continue Aspiration precautions ? ? ? ? ?Acute pulmonary embolism ([place]) ?CTA chest showed bilateral segmental PE.  LE [place] negative for DVT.   ?TTE with normal LV systolic function.  Transitioned Lovenox to Eliquis. ?Oxygen now weaned off. ? ?Acute metabolic encephalopathy ?CT head, MRI brain, EEG, TSH, B12, ammonia unrevealing.   ?Etiology likely multifactorial with acute respiratory failure sec. to aspiration pneumonia, bilateral pulmonary embolism.   ?Completed course of antibiotics. ?--Discontinue Seroquel on 3/16 ?--Discontinued clonazepam on 3/17 ? ?Severe sepsis due to recurrent aspiration pneumonia ?Likely recurrent issue during hospitalization, remains n.p.o. due to continued aspiration risk per SLP.  Completed extensive course of antibiotics while under ICU care. S/p IR gtube 3/6. ?--Continue tube feeds ?--Continue aspiration precautions ?--Continue SLP efforts while inpatient ? ?Sinus tachycardia ?Multifactorial including sepsis, dehydration, PE, agitation.   ?--Continue metoprolol 100 mg twice daily ? ? ? ?Elevated liver enzymes ?Etiology likely secondary to sepsis from aspiration pneumonia as above.  Acute hepatitis panel negative.  RUQ ultrasound unremarkable. ?Continue monitor LFTs intermittently ? ?Hyponatremia ?Etiology likely secondary to dehydration.  Urine sodium low.  Continue tube feeds. ? ? ?Tobacco use disorder ?Encouraged cessation. ?--Nicotine patch; decreased to 7 mg on 3/15 ? ?Physical deconditioning ?Significant weakness in all extremities as a result of cervical myelopathy and acute illness.  Slowly improving while  inpatient. ?--Continue PT/OT efforts, will need SNF placement; difficult placement per TOC but may be able to continue gradual improvement inpatient for possible transition home with home health at some point. ? ?Urinary retention ?Foley catheter discontinued on 08/09/2021 with no current signs of urinary retention. ?--Continue bethanechol ?--Continue bladder scan as needed ?--Continue monitor urinary output ? ?Protein-calorie malnutrition, severe ([place]) ?As evidenced by poor p.o. intake, significant muscle mass and subcu fat loss and significant weight loss (about 16 pounds since this hospitalization). ?Nutrition Problem: Severe Malnutrition (in the context of social/environmental circumstances) ?Etiology:  (inadequate energy intake) ?Signs/Symptoms: mild fat depletion, severe muscle depletion, severe muscle depletion ?Interventions: Refer to RD note for recommendations  ?--Dietitian following, appreciate assistance.   ?--s/p IR gtube 3/6.  ?--Continue tube feeds ?--continue SLP efforts ? ?Hyperglycemia-resolved as of 07/12/2021 ?Likely due to steroid.  Resolved.  A1c 5.2%. ? ? ? ? ?DVT prophylaxis: SCD's Start: 06/29/21 1231 ?apixaban (ELIQUIS) tablet 5 mg  ?  Code Status: Full Code ?Family Communication: No family present at bedside this morning ? ?Disposition Plan:  ?Level of care: Telemetry Medical ?Status is: Inpatient ? ?Remains inpatient appropriate because: Pending SNF placement, will be difficult per TOC ? ? ?Procedures:  ?ACDF C3-7, neurosurgery Dr. Trenton Gammon 2/3 ?Hematoma evacuation 2/7 ?Vascular duplex ultrasound bilateral lower extremities 2/19 ?TTE 2/20 ?TTE 3/1 ?IR G-tube placement 3/6 ? ?Antimicrobials:  ?Vancomycin 2/7 - 2/8; 2/18 - 2/21 ?Zosyn 2/21 -2/23 ?Ampicillin 2/7 -2/11 ?Metronidazole 2/7 - 2/8; 2/17 -2/18 ?Cefepime 2/18 - 2/21 ?Perioperative cefazolin ? ?Subjective: ?Patient seen examined bedside, resting comfortably.  No complaints this morning.  Foley catheter was removed yesterday with no  current signs of recurrent urinary retention.  RN reported yesterday afternoon, patient was found on the floor at side of bed by staff; patient was  asymptomatic.  We will discontinue clonazepam today.  No family present.

## 2021-08-11 DIAGNOSIS — G959 Disease of spinal cord, unspecified: Secondary | ICD-10-CM | POA: Diagnosis not present

## 2021-08-11 LAB — GLUCOSE, CAPILLARY
Glucose-Capillary: 109 mg/dL — ABNORMAL HIGH (ref 70–99)
Glucose-Capillary: 111 mg/dL — ABNORMAL HIGH (ref 70–99)
Glucose-Capillary: 115 mg/dL — ABNORMAL HIGH (ref 70–99)
Glucose-Capillary: 115 mg/dL — ABNORMAL HIGH (ref 70–99)
Glucose-Capillary: 116 mg/dL — ABNORMAL HIGH (ref 70–99)
Glucose-Capillary: 118 mg/dL — ABNORMAL HIGH (ref 70–99)

## 2021-08-11 NOTE — Progress Notes (Signed)
?PROGRESS NOTE ? ? ? ?SKYLAR SIEN  [id number] DOB: Oct 31, 1960 DOA: 06/29/2021 ?PCP: Pcp, No  ? ? ?Brief Narrative:  ?BERWYN RIM is a 61 year old male with past medical history significant for tobacco and EtOH use disorder who was admitted by neurosurgery for cervical myelopathy due to critical multilevel cervical spinal canal stenosis C3-6 with spinal cord compression and spinal cord signal change after recent fall with progressive upper and lower extremity weakness and tremors.  He was admitted on 2/3 and underwent ACDF of C3-4, C4-5, C5-5, and C6-7.  Post-operatively he was progressing slowly with residual weakness in both hands but improving lower extremity strength and function.  He developed some difficulty swallowing on 2/5 and SLP ordered and placed on thickened liquids.   Overnight 2/6, patient with increased difficulty swallowing and was made NPO but also noted to have dysarthria and difficulty moving extremities with numbness.  SLP evaluated and found to be moderate aspiration risk.   Also progressively tachycardic, tachypneic, and now febrile.  Code stroke activated and taken for CT/ CTA head and neck and was given decadron 10mg  once.  NIHSS 18.  CTH was negative for acute findings, and CTA head neck did not reveal any LVO but noted significant prevertebral soft tissue swelling with foci of gas present at the ventral epidural space at C4-5, small collection not excluded. On 2/7, patient with worsening confusion and concern for airway involvement, PCCM consulted and remained in the intensive care unit until he is transferred to the floor on 3/2 and PCCM requested transition to [facility] for medical assistance while patient remains under the neurosurgery service. ? ?Significant Hospital Events: ?2/3 ACDF C3-4, C4-5, C5-5, and C6-7 w/ Dr. Annette Stable ?2/6 SLP eval for difficulty swallowing ?2/7 Code stroke overnight, neg for LVO, CT showing soft tissue swelling.  PCCM consulted for concern of airway  management and AMS.  Went for evacuation of hematoma  ?2/18 CT Abd/Pelvis: showing bilateral segmental Pes, started on heparin and showing worsening pneumonia, febrile to  103.  PCT rising, 34.6, restarted on abx ?2/19 possible aspiration w/ severe hypoxia; intubated; Switching from heparin to angiomax given subtherapeutic levels despite increase in rate. ?2/20 Echo shows normal LV systolic function. There was notation of a + McConnell's sign c/w a large pulmonary embolus which is consistent with the finding of bilateral segmental pulmonary emboli noted on CT 07/14/2021. ?2/21 rash appreciated on back; stopped cefepim/vanc switched to zosyn; required low dose levo with increase in fentanyl ?2/22 remains intubated; unable to extubate to do increase RR and thick secretions ?2/23 [name] infusion added ?2/24: weaning fentanyl.  ?2/26 extubated ?2/28 tachycardia persists ?3/2: Modified barium swallow, continues n.p.o. with core track in place, likely will need PEG tube. ?3/6: s/p IR G-tube placement ?Now medically clear awaiting SNF placement. ?  ? ? ?Assessment & Plan: ?  ?Assessment and Plan: ?* Cervical myelopathy ([place]); severe cervical spinal stenosis with cord compression s/p ACDF [id number] on 2/3, complicated by epidural hematoma s/p evacuation 2/7 ?Patient initially presented s/p recent fall with progressive upper and lower extremity weakness and tremors, He was found to have critical multilevel cervical spinal stenosis @ C3-3 6 with spinal cord compression and spinal cord signal change and underwent ACDF C3-4, C4-5, C5-5, and C6/7 by neurosurgery Dr. Trenton Gammon on 06/29/2021.  Postoperative leg complicated by epidural hematoma s/p evacuation on 2/7. ?--Continue PT/OT/SLP efforts, Awaiting SNF; but given improving strength may be able to progress to home with home health ?--Further as per neurosurgery recommendation. ? ?  Acute respiratory failure with hypoxia ([place]) ?Likely Multifactorial with significant dysphagia and  recurrent aspiration pneumonia events during initial hospitalization following ACDF surgery with postoperative complications of postoperative cervical hematoma.  Patient did require ventilatory support in the intensive care unit and was successfully extubated on 07/22/2021.  Patient completed extensive course of empiric antibiotics.  Oxygen now weaned off, Now on room air. ?--Continue Aspiration precautions ? ? ? ? ?Acute pulmonary embolism ([place]) ?CTA chest showed bilateral segmental PE.  LE [place] negative for DVT.   ?TTE with normal LV systolic function.  Transitioned Lovenox to Eliquis. ?Oxygen now weaned off. ? ?Acute metabolic encephalopathy ?CT head, MRI brain, EEG, TSH, B12, ammonia unrevealing.   ?Etiology likely multifactorial with acute respiratory failure sec. to aspiration pneumonia, bilateral pulmonary embolism.   ?Completed course of antibiotics. ?--Discontinued Seroquel on 3/16 ?--Discontinued clonazepam on 3/17 ? ?Severe sepsis due to recurrent aspiration pneumonia ?Likely recurrent issue during hospitalization, remains n.p.o. due to continued aspiration risk per SLP.  Completed extensive course of antibiotics while under ICU care. S/p IR gtube 3/6. ?--Continue tube feeds ?--Continue aspiration precautions ?--Continue SLP efforts while inpatient ? ?Sinus tachycardia ?Multifactorial including sepsis, dehydration, PE, agitation.   ?--Continue metoprolol 100 mg twice daily ? ? ? ?Elevated liver enzymes ?Etiology likely secondary to sepsis from aspiration pneumonia as above.  Acute hepatitis panel negative.  RUQ ultrasound unremarkable. ?Continue monitor LFTs intermittently ? ?Hyponatremia ?Etiology likely secondary to dehydration.  Urine sodium low.  Continue tube feeds. ? ? ?Tobacco use disorder ?Encouraged cessation. ?--Nicotine patch; decreased to 7 mg on 3/15 ? ?Physical deconditioning ?Significant weakness in all extremities as a result of cervical myelopathy and acute illness.  Slowly improving while  inpatient. ?--Continue PT/OT efforts, will need SNF placement; difficult placement per TOC but may be able to continue gradual improvement inpatient for possible transition home with home health at some point. ? ?Urinary retention ?Foley catheter discontinued on 08/09/2021 with no current signs of urinary retention. ?--Continue bethanechol ?--Continue bladder scan as needed ?--Continue monitor urinary output ? ?Protein-calorie malnutrition, severe ([place]) ?As evidenced by poor p.o. intake, significant muscle mass and subcu fat loss and significant weight loss (about 16 pounds since this hospitalization). ?Nutrition Problem: Severe Malnutrition (in the context of social/environmental circumstances) ?Etiology:  (inadequate energy intake) ?Signs/Symptoms: mild fat depletion, severe muscle depletion, severe muscle depletion ?Interventions: Refer to RD note for recommendations  ?--Dietitian following, appreciate assistance.   ?--s/p IR gtube 3/6.  ?--Continue tube feeds ?--continue SLP efforts ? ?Hyperglycemia-resolved as of 07/12/2021 ?Likely due to steroid.  Resolved.  A1c 5.2%. ? ? ? ? ?DVT prophylaxis: SCD's Start: 06/29/21 1231 ?apixaban (ELIQUIS) tablet 5 mg  ?  Code Status: Full Code ?Family Communication: No family present at bedside this morning ? ?Disposition Plan:  ?Level of care: Med-Surg ?Status is: Inpatient ? ?Remains inpatient appropriate because: Pending SNF placement, will be difficult per TOC ? ? ?Procedures:  ?ACDF C3-7, neurosurgery Dr. Trenton Gammon 2/3 ?Hematoma evacuation 2/7 ?Vascular duplex ultrasound bilateral lower extremities 2/19 ?TTE 2/20 ?TTE 3/1 ?IR G-tube placement 3/6 ? ?Antimicrobials:  ?Vancomycin 2/7 - 2/8; 2/18 - 2/21 ?Zosyn 2/21 -2/23 ?Ampicillin 2/7 -2/11 ?Metronidazole 2/7 - 2/8; 2/17 -2/18 ?Cefepime 2/18 - 2/21 ?Perioperative cefazolin ? ?Subjective: ?Patient seen examined bedside, resting comfortably.  No complaints this morning.  Remains weak and deconditioned, although much improved  since earlier in the hospitalization.  No family present.  Pending SNF placement, pending Medicaid per TOC.  No other questions or concerns at this time.  Denies headache, no chest pain, no shortness of breath, no abdomi

## 2021-08-11 NOTE — Progress Notes (Signed)
Patient ID: Brian [name], male   DOB: 1960-06-11, 61 y.o.   MRN: [id number] ?Vital signs are stable ?Patient is awake and alert ?He is feeling his strength improving somewhat ?Is asking when he will be discharged ?For now continue supportive care ?

## 2021-08-12 DIAGNOSIS — G959 Disease of spinal cord, unspecified: Secondary | ICD-10-CM | POA: Diagnosis not present

## 2021-08-12 LAB — GLUCOSE, CAPILLARY
Glucose-Capillary: 103 mg/dL — ABNORMAL HIGH (ref 70–99)
Glucose-Capillary: 105 mg/dL — ABNORMAL HIGH (ref 70–99)
Glucose-Capillary: 109 mg/dL — ABNORMAL HIGH (ref 70–99)
Glucose-Capillary: 112 mg/dL — ABNORMAL HIGH (ref 70–99)
Glucose-Capillary: 118 mg/dL — ABNORMAL HIGH (ref 70–99)
Glucose-Capillary: 127 mg/dL — ABNORMAL HIGH (ref 70–99)

## 2021-08-12 NOTE — Progress Notes (Signed)
Patient ID: Brian [name], male   DOB: Feb 12, 1961, 61 y.o.   MRN: [id number] ?Vital signs remained stable ?Patient remains on tube feeding and IV antibiotics ?He is eager to go home ?Continue supportive care ?

## 2021-08-12 NOTE — Progress Notes (Signed)
?PROGRESS NOTE ? ? ? ?Brian [name]  [id number] DOB: [birth year] DOA: 06/29/2021 ?PCP: Pcp, No  ? ? 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  He was admitted on 2/3 and underwent ACDF of C3-4, C4-5, C5-5, and C6-7.  Post-operatively he was progressing slowly with residual weakness in both hands but improving lower extremity strength and function.  He developed some difficulty swallowing on 2/5 and SLP ordered and placed on thickened liquids.   Overnight 2/6, patient with increased difficulty swallowing and was made NPO but also noted to have dysarthria and difficulty moving extremities with numbness.  SLP evaluated and found to be moderate aspiration risk.   Also progressively tachycardic, tachypneic, and now febrile.  Code stroke activated and taken for CT/ CTA head and neck and was given decadron 10mg  once.  NIHSS 18.  CTH was negative for acute findings, and CTA head neck did not reveal any LVO but noted significant prevertebral soft tissue swelling with foci of gas present at the ventral epidural space at C4-5, small collection not excluded. On 2/7, patient with worsening confusion and concern for airway involvement, PCCM consulted and remained in the intensive care unit until he is transferred to the floor on 3/2 and PCCM requested transition to [facility] for medical assistance while patient remains under the neurosurgery service. ? ?Significant Hospital Events: ?2/3 ACDF C3-4, C4-5, C5-5, and C6-7 w/ Dr. Annette Stable ?2/6 SLP eval for difficulty swallowing ?2/7 Code stroke overnight, neg for LVO, CT showing soft tissue swelling.  PCCM consulted for concern of airway  management and AMS.  Went for evacuation of hematoma  ?2/18 CT Abd/Pelvis: showing bilateral segmental Pes, started on heparin and showing worsening pneumonia, febrile to  103.  PCT rising, 34.6, restarted on abx ?2/19 possible aspiration w/ severe hypoxia; intubated; Switching from heparin to angiomax given subtherapeutic levels despite increase in rate. ?2/20 Echo shows normal LV systolic function. There was notation of a + McConnell's sign c/w a large pulmonary embolus which is consistent with the finding of bilateral segmental pulmonary emboli noted on CT 07/14/2021. ?2/21 rash appreciated on back; stopped cefepim/vanc switched to zosyn; required low dose levo with increase in fentanyl ?2/22 remains intubated; unable to extubate to do increase RR and thick secretions ?2/23 [name] infusion added ?2/24: weaning fentanyl.  ?2/26 extubated ?2/28 tachycardia persists ?3/2: Modified barium swallow, continues n.p.o. with core track in place, likely will need PEG tube. ?3/6: s/p IR G-tube placement ?Now medically clear awaiting SNF placement. ?  ? ? ?Assessment & Plan: ?  ?Assessment and Plan: ?* Cervical myelopathy ([place]); severe cervical spinal stenosis with cord compression s/p ACDF [id number] on 2/3, complicated by epidural hematoma s/p evacuation 2/7 ?Patient initially presented s/p recent fall with progressive upper and lower extremity weakness and tremors, He was found to have critical multilevel cervical spinal stenosis @ C3-3 6 with spinal cord compression and spinal cord signal change and underwent ACDF C3-4, C4-5, C5-5, and C6/7 by neurosurgery Dr. Trenton Gammon on 06/29/2021.  Postoperative leg complicated by epidural hematoma s/p evacuation on 2/7. ?--Continue PT/OT/SLP efforts, Awaiting SNF; but given improving strength may be able to progress to home with home health ?--Further as per neurosurgery recommendation. ? ?  Acute respiratory failure with hypoxia ([place]) ?Likely Multifactorial with significant dysphagia and  recurrent aspiration pneumonia events during initial hospitalization following ACDF surgery with postoperative complications of postoperative cervical hematoma.  Patient did require ventilatory support in the intensive care unit and was successfully extubated on 07/22/2021.  Patient completed extensive course of empiric antibiotics.  Oxygen now weaned off, Now on room air. ?--Continue Aspiration precautions ? ? ? ? ?Acute pulmonary embolism ([place]) ?CTA chest showed bilateral segmental PE.  LE [place] negative for DVT.   ?TTE with normal LV systolic function.  Transitioned Lovenox to Eliquis. ?Oxygen now weaned off. ? ?Acute metabolic encephalopathy ?CT head, MRI brain, EEG, TSH, B12, ammonia unrevealing.   ?Etiology likely multifactorial with acute respiratory failure sec. to aspiration pneumonia, bilateral pulmonary embolism.   ?Completed course of antibiotics. ?--Discontinued Seroquel on 3/16 ?--Discontinued clonazepam on 3/17 ? ?Severe sepsis due to recurrent aspiration pneumonia ?Likely recurrent issue during hospitalization, remains n.p.o. due to continued aspiration risk per SLP.  Completed extensive course of antibiotics while under ICU care. S/p IR gtube 3/6. ?--Continue tube feeds ?--Continue aspiration precautions ?--Continue SLP efforts while inpatient ? ?Sinus tachycardia ?Multifactorial including sepsis, dehydration, PE, agitation.   ?--Continue metoprolol 100 mg twice daily ? ? ? ?Elevated liver enzymes ?Etiology likely secondary to sepsis from aspiration pneumonia as above.  Acute hepatitis panel negative.  RUQ ultrasound unremarkable. ?Continue monitor LFTs intermittently ? ?Hyponatremia ?Etiology likely secondary to dehydration.  Urine sodium low.  Continue tube feeds. ? ? ?Tobacco use disorder ?Encouraged cessation. ?--Nicotine patch; decreased to 7 mg on 3/15 ? ?Physical deconditioning ?Significant weakness in all extremities as a result of cervical myelopathy and acute illness.  Slowly improving while  inpatient. ?--Continue PT/OT efforts, will need SNF placement; difficult placement per TOC but may be able to continue gradual improvement inpatient for possible transition home with home health at some point. ? ?Urinary retention ?Foley catheter discontinued on 08/09/2021 with no current signs of urinary retention. ?--Continue bethanechol ?--Continue bladder scan as needed ?--Continue monitor urinary output ? ?Protein-calorie malnutrition, severe ([place]) ?As evidenced by poor p.o. intake, significant muscle mass and subcu fat loss and significant weight loss (about 16 pounds since this hospitalization). ?Nutrition Problem: Severe Malnutrition (in the context of social/environmental circumstances) ?Etiology:  (inadequate energy intake) ?Signs/Symptoms: mild fat depletion, severe muscle depletion, severe muscle depletion ?Interventions: Refer to RD note for recommendations  ?--Dietitian following, appreciate assistance.   ?--s/p IR gtube 3/6.  ?--Continue tube feeds ?--continue SLP efforts ? ?Hyperglycemia-resolved as of 07/12/2021 ?Likely due to steroid.  Resolved.  A1c 5.2%. ? ? ? ? ?DVT prophylaxis: SCD's Start: 06/29/21 1231 ?apixaban (ELIQUIS) tablet 5 mg  ?  Code Status: Full Code ?Family Communication: No family present at bedside this morning ? ?Disposition Plan:  ?Level of care: Med-Surg ?Status is: Inpatient ? ?Remains inpatient appropriate because: Pending SNF placement, will be difficult per TOC ? ? ?Procedures:  ?ACDF C3-7, neurosurgery Dr. Trenton Gammon 2/3 ?Hematoma evacuation 2/7 ?Vascular duplex ultrasound bilateral lower extremities 2/19 ?TTE 2/20 ?TTE 3/1 ?IR G-tube placement 3/6 ? ?Antimicrobials:  ?Vancomycin 2/7 - 2/8; 2/18 - 2/21 ?Zosyn 2/21 -2/23 ?Ampicillin 2/7 -2/11 ?Metronidazole 2/7 - 2/8; 2/17 -2/18 ?Cefepime 2/18 - 2/21 ?Perioperative cefazolin ? ?Subjective: ?Patient seen examined bedside, resting comfortably.  Patient requesting assistance in repositioning in bed.  No other specific complaints,  concerns or questions this morning.  Continues with generalized weakness; although slowly improving.  No family present.  Awaiting SNF placement and pending Medicaid per TOC.  Discussed with RN plan of care.  Denies he

## 2021-08-13 DIAGNOSIS — G959 Disease of spinal cord, unspecified: Secondary | ICD-10-CM | POA: Diagnosis not present

## 2021-08-13 LAB — COMPREHENSIVE METABOLIC PANEL
ALT: 62 U/L — ABNORMAL HIGH (ref 0–44)
AST: 45 U/L — ABNORMAL HIGH (ref 15–41)
Albumin: 2.9 g/dL — ABNORMAL LOW (ref 3.5–5.0)
Alkaline Phosphatase: 198 U/L — ABNORMAL HIGH (ref 38–126)
Anion gap: 8 (ref 5–15)
BUN: 24 mg/dL — ABNORMAL HIGH (ref 6–20)
CO2: 27 mmol/L (ref 22–32)
Calcium: 10 mg/dL (ref 8.9–10.3)
Chloride: 93 mmol/L — ABNORMAL LOW (ref 98–111)
Creatinine, Ser: 0.71 mg/dL (ref 0.61–1.24)
GFR, Estimated: >60 mL/min (ref >60)
Glucose, Bld: 97 mg/dL (ref 70–99)
Potassium: 4.2 mmol/L (ref 3.5–5.1)
Sodium: 128 mmol/L — ABNORMAL LOW (ref 135–145)
Total Bilirubin: 0.5 mg/dL (ref 0.3–1.2)
Total Protein: 8.6 g/dL — ABNORMAL HIGH (ref 6.5–8.1)

## 2021-08-13 LAB — CBC
HCT: 29.6 % — ABNORMAL LOW (ref 39.0–52.0)
Hemoglobin: 10.2 g/dL — ABNORMAL LOW (ref 13.0–17.0)
MCH: 31.4 pg (ref 26.0–34.0)
MCHC: 34.5 g/dL (ref 30.0–36.0)
MCV: 91.1 fL (ref 80.0–100.0)
Platelets: 585 10*3/uL — ABNORMAL HIGH (ref 150–400)
RBC: 3.25 MIL/uL — ABNORMAL LOW (ref 4.22–5.81)
RDW: 14.6 % (ref 11.5–15.5)
WBC: 10.2 10*3/uL (ref 4.0–10.5)
nRBC: 0 % (ref 0.0–0.2)

## 2021-08-13 LAB — GLUCOSE, CAPILLARY
Glucose-Capillary: 99 mg/dL (ref 70–99)
Glucose-Capillary: 124 mg/dL — ABNORMAL HIGH (ref 70–99)
Glucose-Capillary: 95 mg/dL (ref 70–99)
Glucose-Capillary: 112 mg/dL — ABNORMAL HIGH (ref 70–99)

## 2021-08-13 LAB — MAGNESIUM: Magnesium: 1.7 mg/dL (ref 1.7–2.4)

## 2021-08-13 LAB — PHOSPHORUS: Phosphorus: 4.3 mg/dL (ref 2.5–4.6)

## 2021-08-13 MED ORDER — FREE WATER
100.0000 mL | Status: DC
  Administered 2021-08-13 – 2021-08-23 (×54): 100 mL

## 2021-08-13 MED ORDER — ADULT MULTIVITAMIN W/MINERALS CH
1.0000 | ORAL_TABLET | Freq: Every day | ORAL | Status: DC
  Administered 2021-08-14 – 2021-09-22 (×39): 1
  Filled 2021-08-13 (×40): qty 1

## 2021-08-13 NOTE — Progress Notes (Signed)
Nutrition Follow-up ? ?DOCUMENTATION CODES:  ?Severe malnutrition in context of social or environmental circumstances, Underweight ? ?INTERVENTION:  ?Continue TF via PEG: ?Osmolite 1.5 at 50 ml/hr ?ProSource TF 45 mL TID ?171m free water Q4H ?This provides 1920 kcals, 108 g of protein and 914 mL of free water (1514mtotal free water with flushes) ? ?Transition from liquid MVI to MVI with minerals daily which can be administered via tube ? ?NUTRITION DIAGNOSIS:  ?Severe Malnutrition related to social / environmental circumstances as evidenced by severe muscle depletion, severe fat depletion. -- ongoing  ? ?GOAL:  ?Patient will meet greater than or equal to 90% of their needs -- met with TF at goal ? ?MONITOR:  ?TF tolerance ? ?REASON FOR ASSESSMENT:  ?Consult ?Assessment of nutrition requirement/status, Enteral/tube feeding initiation and management ? ?ASSESSMENT:  ?Pt with hx EtOH abuse, tobacco use, and spinal stenosis initially presented 2/3 for planned multilevel anterior cervical decompression and fusion surgery after experiencing progressive bilateral upper and lower extremity weakness and spasticity due to critical multilevel cervical spinal stenosis with spinal cord compression and signal change. ? ?02/03 - s/p anterior cervical discectomy with interbody fusion  ?02/05 - pt initially complained of worsening swallowing function ?02/06 - MBS, NPO per SLP ?02/07 - Code Stroke activated (negative) but transferred to ICU, s/p re-exploration anterior cervical fusion with evacuation of epidural hematoma ?02/08 - Cortrak tube placed (tip gastric), tube feeds initiated ?02/10 - MBS, diet advanced to dysphagia 2 with nectar-thick liquids, Cortrak removed ?02/11 - NPO ?02/13 - diet advanced to dysphagia 2 with nectar-thick liquids ?02/16 - diet advanced to dysphagia 2 with thin liquids ?02/19- Intubated after desaturation, possibly from PO intake ?02/20 - s/p cortrak tube; post pyloric  ?02/22 - TF off, golytely  given ?02/23 - restarted and advancing TF ?02/26 - Extubated ?03/02 - failed MBS ?03/06 - PEG placed ? ?Pt sleeping at time of RD visit. MD reports pt stable and pending placement. Per RN, pt tolerating TF via PEG. Current TF regimen is osmolite 1.5  @ 5075mr and 56m42mosource TF TID and 165ml87me water Q4H ? ?UOP: 550ml 64mhours ?I/O: -5958ml s21m admit ? ?Admit wt 61.7 kg ?Current wt 50.2 kg ? ?Labs: Na 128 (L) ?CBGs 99-127 x 24 hours ?Medications: MVI, thiamine, folvite ? ?Diet Order:   ?Diet Order   ? ? None  ? ?  ? ?EDUCATION NEEDS:  ?Education needs have been addressed ? ?Skin:  Skin Assessment: Skin Integrity Issues: ?Skin Integrity Issues:: Other (Comment) ?Incisions: closed neck ?Other: MASD buttocks ? ?Last BM:  3/19 type 6 ? ?Height:  ?Ht Readings from Last 1 Encounters:  ?07/15/21 5' 10"  (1.778 m)  ? ?Weight:  ?Wt Readings from Last 1 Encounters:  ?08/13/21 50.2 kg  ? ?Ideal Body Weight:  78.2 kg ? ?BMI:  Body mass index is 15.88 kg/m?. ? ?Estimated Nutritional Needs:  ?Kcal:  1800-2000 ?Protein:  100-115 grams ?Fluid:  >1.8 L/day ? ? ? ?Kasiya Burck Theone StanleyRD, LDN (she/her/hers) ?RD pager number and weekend/on-call pager number located in [employer]. [place]

## 2021-08-13 NOTE — Progress Notes (Signed)
?PROGRESS NOTE ? ? ? ?Brian [name]  [id number] DOB: Jun 10, 1960 DOA: 06/29/2021 ?PCP: Pcp, No  ? ? 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  He was admitted on 2/3 and underwent ACDF of C3-4, C4-5, C5-5, and C6-7.  Post-operatively he was progressing slowly with residual weakness in both hands but improving lower extremity strength and function.  He developed some difficulty swallowing on 2/5 and SLP ordered and placed on thickened liquids.   Overnight 2/6, patient with increased difficulty swallowing and was made NPO but also noted to have dysarthria and difficulty moving extremities with numbness.  SLP evaluated and found to be moderate aspiration risk.   Also progressively tachycardic, tachypneic, and now febrile.  Code stroke activated and taken for CT/ CTA head and neck and was given decadron 10mg  once.  NIHSS 18.  CTH was negative for acute findings, and CTA head neck did not reveal any LVO but noted significant prevertebral soft tissue swelling with foci of gas present at the ventral epidural space at C4-5, small collection not excluded. On 2/7, patient with worsening confusion and concern for airway involvement, PCCM consulted and remained in the intensive care unit until he is transferred to the floor on 3/2 and PCCM requested transition to [facility] for medical assistance while patient remains under the neurosurgery service. ? ?Significant Hospital Events: ?2/3 ACDF C3-4, C4-5, C5-5, and C6-7 w/ Dr. Annette Stable ?2/6 SLP eval for difficulty swallowing ?2/7 Code stroke overnight, neg for LVO, CT showing soft tissue swelling.  PCCM consulted for concern of airway  management and AMS.  Went for evacuation of hematoma  ?2/18 CT Abd/Pelvis: showing bilateral segmental Pes, started on heparin and showing worsening pneumonia, febrile to  103.  PCT rising, 34.6, restarted on abx ?2/19 possible aspiration w/ severe hypoxia; intubated; Switching from heparin to angiomax given subtherapeutic levels despite increase in rate. ?2/20 Echo shows normal LV systolic function. There was notation of a + McConnell's sign c/w a large pulmonary embolus which is consistent with the finding of bilateral segmental pulmonary emboli noted on CT 07/14/2021. ?2/21 rash appreciated on back; stopped cefepim/vanc switched to zosyn; required low dose levo with increase in fentanyl ?2/22 remains intubated; unable to extubate to do increase RR and thick secretions ?2/23 [name] infusion added ?2/24: weaning fentanyl.  ?2/26 extubated ?2/28 tachycardia persists ?3/2: Modified barium swallow, continues n.p.o. with core track in place, likely will need PEG tube. ?3/6: s/p IR G-tube placement ?Now medically clear awaiting SNF placement. ?  ? ? ?Assessment & Plan: ?  ?Assessment and Plan: ?* Cervical myelopathy (Brian [name]); severe cervical spinal stenosis with cord compression s/p ACDF [id number] on 2/3, complicated by epidural hematoma s/p evacuation 2/7 ?Patient initially presented s/p recent fall with progressive upper and lower extremity weakness and tremors, He was found to have critical multilevel cervical spinal stenosis @ C3-3 6 with spinal cord compression and spinal cord signal change and underwent ACDF C3-4, C4-5, C5-5, and C6/7 by neurosurgery Dr. Trenton Gammon on 06/29/2021.  Postoperative leg complicated by epidural hematoma s/p evacuation on 2/7. ?--Continue PT/OT/SLP efforts, Awaiting SNF; but given improving strength may be able to progress to home with home health ?--Further as per neurosurgery recommendation. ? ?  Acute respiratory failure with hypoxia ([place]) ?Likely Multifactorial with significant dysphagia and  recurrent aspiration pneumonia events during initial hospitalization following ACDF surgery with postoperative complications of postoperative cervical hematoma.  Patient did require ventilatory support in the intensive care unit and was successfully extubated on 07/22/2021.  Patient completed extensive course of empiric antibiotics.  Oxygen now weaned off, Now on room air. ?--Continue Aspiration precautions ? ? ? ? ?Acute pulmonary embolism ([place]) ?CTA chest showed bilateral segmental PE.  LE [place] negative for DVT.   ?TTE with normal LV systolic function.  Transitioned Lovenox to Eliquis. ?Oxygen now weaned off. ? ?Acute metabolic encephalopathy ?CT head, MRI brain, EEG, TSH, B12, ammonia unrevealing.   ?Etiology likely multifactorial with acute respiratory failure sec. to aspiration pneumonia, bilateral pulmonary embolism.   ?Completed course of antibiotics. ?--Discontinued Seroquel on 3/16 ?--Discontinued clonazepam on 3/17 ? ?Severe sepsis due to recurrent aspiration pneumonia ?Likely recurrent issue during hospitalization, remains n.p.o. due to continued aspiration risk per SLP.  Completed extensive course of antibiotics while under ICU care. S/p IR gtube 3/6. ?--Continue tube feeds ?--Continue aspiration precautions ?--Continue SLP efforts while inpatient ? ?Sinus tachycardia ?Multifactorial including sepsis, dehydration, PE, agitation.   ?--Continue metoprolol 100 mg twice daily ? ? ? ?Elevated liver enzymes ?Etiology likely secondary to sepsis from aspiration pneumonia as above.  Acute hepatitis panel negative.  RUQ ultrasound unremarkable. ?Continue monitor LFTs intermittently ? ?Hyponatremia ?Etiology likely secondary to dehydration.  Urine sodium low.  Continue tube feeds. ? ? ?Tobacco use disorder ?Encouraged cessation. ?--Nicotine patch; decreased to 7 mg on 3/15 ? ?Physical deconditioning ?Significant weakness in all extremities as a result of cervical myelopathy and acute illness.  Slowly improving while  inpatient. ?--Continue PT/OT efforts, will need SNF placement; difficult placement per TOC but may be able to continue gradual improvement inpatient for possible transition home with home health at some point. ? ?Urinary retention ?Foley catheter discontinued on 08/09/2021 with no current signs of urinary retention. ?--Continue bethanechol ?--Continue bladder scan as needed ?--Continue monitor urinary output ? ?Protein-calorie malnutrition, severe ([place]) ?As evidenced by poor p.o. intake, significant muscle mass and subcu fat loss and significant weight loss (about 16 pounds since this hospitalization). ?Nutrition Problem: Severe Malnutrition (in the context of social/environmental circumstances) ?Etiology:  (inadequate energy intake) ?Signs/Symptoms: mild fat depletion, severe muscle depletion, severe muscle depletion ?Interventions: Refer to RD note for recommendations  ?--Dietitian following, appreciate assistance.   ?--s/p IR gtube 3/6.  ?--Continue tube feeds ?--continue SLP efforts ? ?Hyperglycemia-resolved as of 07/12/2021 ?Likely due to steroid.  Resolved.  A1c 5.2%. ? ? ? ? ?DVT prophylaxis: SCD's Start: 06/29/21 1231 ?apixaban (ELIQUIS) tablet 5 mg  ?  Code Status: Full Code ?Family Communication: No family present at bedside this morning ? ?Disposition Plan:  ?Level of care: Med-Surg ?Status is: Inpatient ? ?Remains inpatient appropriate because: Pending SNF placement, will be difficult per TOC ? ? ?Procedures:  ?ACDF C3-7, neurosurgery Dr. Trenton Gammon 2/3 ?Hematoma evacuation 2/7 ?Vascular duplex ultrasound bilateral lower extremities 2/19 ?TTE 2/20 ?TTE 3/1 ?IR G-tube placement 3/6 ? ?Antimicrobials:  ?Vancomycin 2/7 - 2/8; 2/18 - 2/21 ?Zosyn 2/21 -2/23 ?Ampicillin 2/7 -2/11 ?Metronidazole 2/7 - 2/8; 2/17 -2/18 ?Cefepime 2/18 - 2/21 ?Perioperative cefazolin ? ?Subjective: ?Patient seen examined bedside, resting comfortably.  No complaints this morning.  Patient states goal for today is to work with therapy as  best as he can.  Continues with generalized weakness; although slowly improving.  No family present.  Awaiting SNF placement and pending  Medicaid per TOC.  Denies headache, no chest pain, no shortness of breath, no abd

## 2021-08-13 NOTE — Progress Notes (Signed)
Physical Therapy Treatment ?Patient Details ?Name: Brian [name] ?MRN: [id number] ?DOB: [birth year] ?Today's Date: 08/13/2021 ? ? ?History of Present Illness Pt is a 61 y/o male admitted 06/29/21 following C3-7 ACDF after progressive cervical myelopathy symptoms including ataxia, bil UE and LE shaking and weakness, and loss of function in his hands with subsequent fall. Pt initially had improvement in neurological symptoms, but postoperative course was complicated by dysphasia and ataxia secondary to and epidural hematoma with evacuation of hematoma on 2/7. Course complicated by delirium secondary to encephalopathy, sepsis secondary to aspiration PNA, reintubated 2/19-2/26 for airway protection, bilat segmental PEs 2/18. PEG placed 3/6. PMHx includes tobacco and EtOH use disorder. ? ?  ?PT Comments  ? ? Pt making steady progress with mobility. Able to use Stedy to stand and get to chair with 2 person assist. Needs a lot of work on trunk control. Continue to recommend SNF.    ?Recommendations for follow up therapy are one component of a multi-disciplinary discharge planning process, led by the attending physician.  Recommendations may be updated based on patient status, additional functional criteria and insurance authorization. ? ?Follow Up Recommendations ? Skilled nursing-short term rehab (<3 hours/day) ?  ?  ?Assistance Recommended at Discharge Frequent or constant Supervision/Assistance  ?Patient can return home with the following Two people to help with walking and/or transfers;A lot of help with bathing/dressing/bathroom;Assistance with cooking/housework;Assistance with feeding;Direct supervision/assist for medications management;Direct supervision/assist for financial management;Assist for transportation;Help with stairs or ramp for entrance ?  ?Equipment Recommendations ? Wheelchair (measurements PT);Wheelchair cushion (measurements PT);Hospital bed  ?  ?Recommendations for Other Services   ? ? ?  ?Precautions  / Restrictions Precautions ?Precautions: Fall ?Required Braces or Orthoses: Cervical Brace (none in room) ?Cervical Brace: Soft collar;For comfort  ?  ? ?Mobility ? Bed Mobility ?Overal bed mobility: Needs Assistance ?Bed Mobility: Rolling, Sidelying to Sit ?Rolling: Mod assist ?Sidelying to sit: Mod assist ?  ?  ?  ?General bed mobility comments: Assist to bring legs over and off of bed, elevate trunk into sitting, and bring hips to EOB ?  ? ?Transfers ?Overall transfer level: Needs assistance ?Equipment used: Ambulation equipment used ?Transfers: Sit to/from Stand, Bed to chair/wheelchair/BSC ?Sit to Stand: +2 physical assistance, Mod assist ?  ?  ?  ?  ?  ?General transfer comment: Assist to bring hips up and to control trunk to prevent excessive forward flexion of trunk. Verbal/tactile cues to bring trunk and shoulders back and to extend hips/knees. Used stedy for bed to recliner to bsc to recliner ?Transfer via Lift Equipment: Stedy ? ?Ambulation/Gait ?  ?  ?  ?  ?  ?  ?  ?  ? ? ?Stairs ?  ?  ?  ?  ?  ? ? ?Wheelchair Mobility ?  ? ?Modified Rankin (Stroke Patients Only) ?  ? ? ?  ?Balance Overall balance assessment: Needs assistance ?Sitting-balance support: Single extremity supported, Feet supported ?Sitting balance-Leahy Scale: Poor ?Sitting balance - Comments: UE assist and intermittent loss of balance require assist to correct ?  ?Standing balance support: Bilateral upper extremity supported ?Standing balance-Leahy Scale: Poor ?Standing balance comment: Stood x 6 for 20 - 45 sec with mod assist to maintain ?  ?  ?  ?  ?  ?  ?  ?  ?  ?  ?  ?  ? ?  ?Cognition Arousal/Alertness: Awake/alert ?Behavior During Therapy: Flat affect ?Overall Cognitive Status: No family/caregiver present to determine baseline cognitive functioning ?Area of  Impairment: Orientation, Memory, Safety/judgement, Awareness, Problem solving ?  ?  ?  ?  ?  ?  ?  ?  ?Orientation Level: Disoriented to, Time, Situation ?Current Attention Level:  Sustained ?Memory: Decreased short-term memory, Decreased recall of precautions ?Following Commands: Follows one step commands with increased time ?Safety/Judgement: Decreased awareness of safety, Decreased awareness of deficits ?Awareness: Intellectual ?Problem Solving: Requires verbal cues, Requires tactile cues, Difficulty sequencing ?  ?  ?  ? ?  ?Exercises   ? ?  ?General Comments   ?  ?  ? ?Pertinent Vitals/Pain Pain Assessment ?Pain Assessment: No/denies pain  ? ? ?Home Living   ?  ?  ?  ?  ?  ?  ?  ?  ?  ?   ?  ?Prior Function    ?  ?  ?   ? ?PT Goals (current goals can now be found in the care plan section) Acute Rehab PT Goals ?Patient Stated Goal: get out of here ?Progress towards PT goals: Progressing toward goals ? ?  ?Frequency ? ? ? Min 2X/week ? ? ? ?  ?PT Plan Current plan remains appropriate  ? ? ?Co-evaluation   ?  ?  ?  ?  ? ?  ?AM-PAC PT "6 Clicks" Mobility   ?Outcome Measure ? Help needed turning from your back to your side while in a flat bed without using bedrails?: A Lot ?Help needed moving from lying on your back to sitting on the side of a flat bed without using bedrails?: A Lot ?Help needed moving to and from a bed to a chair (including a wheelchair)?: Total ?Help needed standing up from a chair using your arms (e.g., wheelchair or bedside chair)?: Total ?Help needed to walk in hospital room?: Total ?Help needed climbing 3-5 steps with a railing? : Total ?6 Click Score: 8 ? ?  ?End of Session Equipment Utilized During Treatment: Gait belt ?Activity Tolerance: Patient tolerated treatment well ?Patient left: with call bell/phone within reach;with chair alarm set ?Nurse Communication: Mobility status;Need for lift equipment ?PT Visit Diagnosis: Muscle weakness (generalized) (M62.81);Other symptoms and signs involving the nervous system (R29.898);Difficulty in walking, not elsewhere classified (R26.2);Other abnormalities of gait and mobility (R26.89) ?  ? ? ?Time: [id number] ?PT Time  Calculation (min) (ACUTE ONLY): 28 min ? ?Charges:  $Therapeutic Activity: 23-37 mins          ?          ? ?[facility] PT ?Acute Rehabilitation Services ?Pager [phone] ?Office [phone] ? ? ? ?Shary Decamp [facility] ?08/13/2021, 3:56 PM ? ?

## 2021-08-13 NOTE — Progress Notes (Signed)
? ?  Providing Compassionate, Quality Care - Together ? ? ?Subjective: ?Patient denies pain. He reports he is ready to get out of the hospital. ? ?Objective: ?Vital signs in last 24 hours: ?Temp:  [97.6 ?F (36.4 ?C)-99.2 ?F (37.3 ?C)] 97.6 ?F (36.4 ?C) (03/20 1122) ?Pulse Rate:  [83-99] 83 (03/20 1122) ?Resp:  [14-20] 20 (03/20 1122) ?BP: (90-113)/(69-80) 90/75 (03/20 1122) ?SpO2:  [100 %] 100 % (03/20 1122) ?Weight:  [50.2 kg] 50.2 kg (03/20 0312) ? ?Intake/Output from previous day: ?03/19 0701 - 03/20 0700 ?In: 2140 [NG/GT:2140] ?Out: 550 [Urine:550] ?Intake/Output this shift: ?No intake/output data recorded. ? ?Alert, oriented to self, time, and place ?PERRLA ?MAE, LLE >RLE ?Decreased fine motor BUE ?Incision is healing well ?  ? ?Lab Results: ?Recent Labs  ?  08/13/21 ?0450  ?WBC 10.2  ?HGB 10.2*  ?HCT 29.6*  ?PLT 585*  ? ?BMET ?Recent Labs  ?  08/13/21 ?0450  ?NA 128*  ?K 4.2  ?CL 93*  ?CO2 27  ?GLUCOSE 97  ?BUN 24*  ?CREATININE 0.71  ?CALCIUM 10.0  ? ? ?Studies/Results: ?No results found. ? ?Assessment/Plan: ?Patient status post C3-4, C4-5, C5-6, C6-7 anterior cervical discectomy with interbody fusion by Dr. Annette Stable on 06/29/2021. Increased difficulty swallowing with lung atelectasis and elevated temperatures on 07/02/2021. Made NPO following MBS by SLP. 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 CTA head and neck negative for stroke. MRI revealed epidural hematoma. Patient underwent exploration of his cervical fusion with evacuation of the epidural hematoma on 07/03/2021. Cortrak was placed on 07/04/2021. Patient's strength much improved since epidural hematoma evacuation. Cortrak removed 07/06/2021 and dysphagia 2 diet with nectar thick liquids started. Patient with delirium vs ETOH withdrawal. Discontinued steroids 07/06/2021. CIWA protocol started and discontinued 07/07/2021. CT and MRI 07/07/2021 negative. Patient developed respiratory distress, requiring intubation on  07/15/2021. Work up revealed aspiration PNA and PE. Patient was extubated on 07/22/2021. Foley catheter removed 07/24/2021. Patient transferred to 3W on 07/26/2021. Foley replaced 07/28/2021. PEG placed 07/30/2021. ? ? LOS: 45 days  ? ?-Continue efforts at rehabilitation. ?-Goal is for SNF placement. ? ? ? ?Viona Gilmore, DNP, AGNP-C ?Nurse Practitioner ? ?[facility] ?[address], [place], [place] [address] ?[id number]    [id number] ? ?08/13/2021, 2:02 PM ? ? ? ? ?

## 2021-08-14 DIAGNOSIS — G959 Disease of spinal cord, unspecified: Secondary | ICD-10-CM | POA: Diagnosis not present

## 2021-08-14 LAB — GLUCOSE, CAPILLARY
Glucose-Capillary: 105 mg/dL — ABNORMAL HIGH (ref 70–99)
Glucose-Capillary: 107 mg/dL — ABNORMAL HIGH (ref 70–99)
Glucose-Capillary: 111 mg/dL — ABNORMAL HIGH (ref 70–99)
Glucose-Capillary: 111 mg/dL — ABNORMAL HIGH (ref 70–99)
Glucose-Capillary: 117 mg/dL — ABNORMAL HIGH (ref 70–99)
Glucose-Capillary: 119 mg/dL — ABNORMAL HIGH (ref 70–99)
Glucose-Capillary: 124 mg/dL — ABNORMAL HIGH (ref 70–99)
Glucose-Capillary: 139 mg/dL — ABNORMAL HIGH (ref 70–99)

## 2021-08-14 NOTE — Progress Notes (Signed)
Overall stable.  Continue supportive efforts.  Awaiting discharge planning ?

## 2021-08-14 NOTE — Progress Notes (Signed)
Occupational Therapy Treatment ?Patient Details ?Name: Brian [name] ?MRN: [id number] ?DOB: 1961/01/11 ?Today's Date: 08/14/2021 ? ? ?History of present illness Pt is a 61 y/o male admitted 06/29/21 following C3-7 ACDF after progressive cervical myelopathy symptoms including ataxia, bil UE and LE shaking and weakness, and loss of function in his hands with subsequent fall. Pt initially had improvement in neurological symptoms, but postoperative course was complicated by dysphasia and ataxia secondary to and epidural hematoma with evacuation of hematoma on 2/7. Course complicated by delirium secondary to encephalopathy, sepsis secondary to aspiration PNA, reintubated 2/19-2/26 for airway protection, bilat segmental PEs 2/18. PEG placed 3/6. PMHx includes tobacco and EtOH use disorder. ?  ?OT comments ? Patient received in bed and eager to participate with OT. Patient transferred to recliner with use of stedy with assistance of 2 for safety but one to stand. Patient performed grooming with washing hands and face with supervision. Patient performed AROM of BUE shoulders with improved ROM and therapy putty exercises with yellow putty to address pinch and grip strength of BUEs. Patient is making good progress and is motivated towards therapy. Acute OT to continue to follow.   ? ?Recommendations for follow up therapy are one component of a multi-disciplinary discharge planning process, led by the attending physician.  Recommendations may be updated based on patient status, additional functional criteria and insurance authorization. ?   ?Follow Up Recommendations ? Skilled nursing-short term rehab (<3 hours/day)  ?  ?Assistance Recommended at Discharge Frequent or constant Supervision/Assistance  ?Patient can return home with the following ? Two people to help with walking and/or transfers;Direct supervision/assist for medications management;Assist for transportation;Help with stairs or ramp for entrance;Assistance with  feeding;Two people to help with bathing/dressing/bathroom;Assistance with cooking/housework;Direct supervision/assist for financial management ?  ?Equipment Recommendations ? Wheelchair (measurements OT);Wheelchair cushion (measurements OT);Hospital bed;BSC/3in1;Tub/shower seat  ?  ?Recommendations for Other Services   ? ?  ?Precautions / Restrictions Precautions ?Precautions: Fall ?Precaution Booklet Issued: Yes (comment) ?Precaution Comments: Verbally reviewed precautions ?Required Braces or Orthoses: Cervical Brace (none in room) ?Cervical Brace: Soft collar;For comfort ?Restrictions ?Weight Bearing Restrictions: No  ? ? ?  ? ?Mobility Bed Mobility ?Overal bed mobility: Needs Assistance ?Bed Mobility: Rolling, Sidelying to Sit ?Rolling: Mod assist ?Sidelying to sit: Mod assist ?  ?  ?  ?General bed mobility comments: assistance with raising trunk ?  ? ?Transfers ?Overall transfer level: Needs assistance ?Equipment used: Ambulation equipment used ?Transfers: Sit to/from Stand, Bed to chair/wheelchair/BSC ?Sit to Stand: Mod assist ?  ?  ?  ?  ?  ?General transfer comment: able to stand from EOB into stedy with mod assist and assistance of 2 for safety with lift ?Transfer via Lift Equipment: Stedy ?  ?Balance Overall balance assessment: Needs assistance ?Sitting-balance support: Single extremity supported, Feet supported ?Sitting balance-Leahy Scale: Poor ?Sitting balance - Comments: reliant on UE assist for balance with patient using stedy for support ?  ?Standing balance support: Bilateral upper extremity supported ?Standing balance-Leahy Scale: Poor ?Standing balance comment: reliant on stedy for support ?  ?  ?  ?  ?  ?  ?  ?  ?  ?  ?  ?   ? ?ADL either performed or assessed with clinical judgement  ? ?ADL Overall ADL's : Needs assistance/impaired ?  ?  ?Grooming: Wash/dry hands;Wash/dry face;Supervision/safety;Cueing for UE precautions ?Grooming Details (indicate cue type and reason): able to wash face and  hands while seated in recliner ?  ?  ?  ?  ?  ?  ?  ?  ?  ?  ?  ?  ?  ?  ?  ?  General ADL Comments: improved ROM of BUE ?  ? ?Extremity/Trunk Assessment Upper Extremity Assessment ?RUE Deficits / Details: improved AROM ?RUE Sensation: decreased light touch;decreased proprioception ?RUE Coordination: decreased fine motor;decreased gross motor ?LUE Deficits / Details: increased AROM ?LUE Sensation: decreased light touch ?LUE Coordination: decreased fine motor;decreased gross motor ?  ?  ?  ?  ?  ? ?Vision   ?  ?  ?Perception   ?  ?Praxis   ?  ? ?Cognition Arousal/Alertness: Awake/alert ?Behavior During Therapy: Flat affect ?Overall Cognitive Status: No family/caregiver present to determine baseline cognitive functioning ?Area of Impairment: Orientation, Memory, Safety/judgement, Awareness, Problem solving ?  ?  ?  ?  ?  ?  ?  ?  ?Orientation Level: Disoriented to, Time, Situation ?Current Attention Level: Sustained ?Memory: Decreased short-term memory, Decreased recall of precautions ?Following Commands: Follows one step commands with increased time ?Safety/Judgement: Decreased awareness of safety, Decreased awareness of deficits ?Awareness: Intellectual ?Problem Solving: Requires verbal cues, Requires tactile cues, Difficulty sequencing ?General Comments: motivated to get out of bed ?  ?  ?   ?Exercises Exercises: General Upper Extremity, Other exercises ?General Exercises - Upper Extremity ?Shoulder Flexion: AROM, Both, 10 reps, Seated ?Shoulder Extension: AROM, Both, 10 reps, Seated ?Shoulder ABduction: AROM, Both, 10 reps, Seated ?Shoulder ADduction: AROM, Both, 10 reps, Seated ?Other Exercises ?Other Exercises: therapy putty exercises to address [facility] and gross grasp ? ?  ?Shoulder Instructions   ? ? ?  ?General Comments    ? ? ?Pertinent Vitals/ Pain       Pain Assessment ?Pain Assessment: No/denies pain ? ?Home Living   ?  ?  ?  ?  ?  ?  ?  ?  ?  ?  ?  ?  ?  ?  ?  ?  ?  ?  ? ?  ?Prior Functioning/Environment    ?   ?  ?  ?   ? ?Frequency ? Min 2X/week  ? ? ? ? ?  ?Progress Toward Goals ? ?OT Goals(current goals can now be found in the care plan section) ? Progress towards OT goals: Progressing toward goals ? ?Acute Rehab OT Goals ?Patient Stated Goal: get better ?OT Goal Formulation: With patient ?Time For Goal Achievement: 08/20/21 ?Potential to Achieve Goals: Fair ?ADL Goals ?Pt Will Perform Eating: with min assist;with assist to don/doff brace/orthosis;with adaptive utensils;sitting ?Pt Will Perform Grooming: sitting;with min assist ?Pt Will Perform Upper Body Bathing: with mod assist;sitting ?Pt Will Transfer to Toilet: with min assist;stand pivot transfer;bedside commode ?Pt/caregiver will Perform Home Exercise Program: Increased ROM;Increased strength;Both right and left upper extremity;With theraputty;With minimal assist;With written HEP provided ?Additional ADL Goal #1: Pt will grasp objects with R hand and bring to mouth 10/10 trials in prep for self feeding ?Additional ADL Goal #2: Pt will grasp/release 5/5 objects with L hand to increase indep with ADLs  ?Plan Discharge plan remains appropriate   ? ?Co-evaluation ? ? ?   ?  ?  ?  ?  ? ?  ?AM-PAC OT "6 Clicks" Daily Activity     ?Outcome Measure ? ? Help from another person eating meals?: Total ?Help from another person taking care of personal grooming?: A Lot ?Help from another person toileting, which includes using toliet, bedpan, or urinal?: Total ?Help from another person bathing (including washing, rinsing, drying)?: Total ?Help from another person to put on and taking off regular upper body clothing?: Total ?Help from another person to put on and taking  off regular lower body clothing?: Total ?6 Click Score: 7 ? ?  ?End of Session Equipment Utilized During Treatment: Gait belt;Other (comment) Charlaine Dalton) ? ?OT Visit Diagnosis: Other abnormalities of gait and mobility (R26.89);Muscle weakness (generalized) (M62.81);Feeding difficulties (R63.3) ?  ?Activity  Tolerance Patient tolerated treatment well ?  ?Patient Left in chair;with call bell/phone within reach;with chair alarm set ?  ?Nurse Communication Mobility status;Need for lift equipment ?  ? ?   ? ?Time: [phone]

## 2021-08-14 NOTE — Progress Notes (Signed)
Speech Language Pathology Treatment: Dysphagia  ?Patient Details ?Name: Brian [name] ?MRN: [id number] ?DOB: 02-Jul-1960 ?Today's Date: 08/14/2021 ?Time: [phone] ?SLP Time Calculation (min) (ACUTE ONLY): 23 min ? ?Assessment / Plan / Recommendation ?Clinical Impression ? Pt alert, cooperative and repositioned for dysphagia treatment with po trials. Reviewed reasoning for his npo status and goals of therapy. He did not recall watching video with therapist last week. He consumed ice chips then separate teaspoons of water and independently recalled left hear turn when questioned. He needed verbal reminders 30% of the time to perform. Pt has a history of silent aspiration previous MBS 3/2 therefore unable to judge integrity of swallow subjectively. He completed trials of effortful swallow and Masako exercise for increase tongue base retraction and pharyngeal contraction. He followed commands however unable to keep tongue protruded during dry swallow.  ? ?Pt demonstrated lucidity this session and acknowledged his situation with therapist. He is appreciative of assistance from staff but states that he is "losing everything and will be homeless when gets out of rehab. I may be able to swallow but not have anything to swallow." Empathetic listening provided to pt and encouragement that he has made overall, significant improvements since this therapist first began working with him. Pt understandably is very concerned with his future. Continue ST. ?  ?HPI HPI: Pt is a 61 y/o admitted 06/29/21 following C3-7 ACDF after progressive cervical myelopathy symptoms with subsequent fall and severe incomplete SCI. Significant prevertebral swelling per imaging. Pt developed some difficulty swallowing on 2/5 and SLP consulted. MBS 2/6 revealed severe edema s/p ACDF, oral holding, a pharyngeal delay, inconsistent hyolaryngeal elevation, incomplete epiglottic inversion due to edema. Aspiration noted accross consistencies and and NPO  status was recommended. Repeat MBS 2/10 showed significantly reduced pharyngeal edema and a dysphagia 2 diet with nectar thick liquids was initiated. Pt transitioned to thin liquids 2/16 and susbequently returned to nectar thick 2/17 due to concern for aspiration. Pt developed pna, possible aspiration, requiring intubation 2/19-26. Cortrak placed 2/20. PEG placed in IR on 3/6. No significant PMH. ?  ?   ?SLP Plan ? Continue with current plan of care ? ?  ?  ?Recommendations for follow up therapy are one component of a multi-disciplinary discharge planning process, led by the attending physician.  Recommendations may be updated based on patient status, additional functional criteria and insurance authorization. ?  ? ?Recommendations  ?Diet recommendations: NPO ?Medication Administration: Via alternative means  ?   ?    ?   ? ? ? ? Oral Care Recommendations: Oral care QID ?Follow Up Recommendations: Skilled nursing-short term rehab (<3 hours/day) ?Assistance recommended at discharge: Frequent or constant Supervision/Assistance ?SLP Visit Diagnosis: Dysphagia, pharyngeal phase (R13.13) ?Plan: Continue with current plan of care ? ? ? ? ?  ?  ? ? ?[name] ? ?08/14/2021, 10:35 AM ?

## 2021-08-14 NOTE — Progress Notes (Signed)
?PROGRESS NOTE ? ? ? ?Brian [name]  [id number] DOB: January 12, 1961 DOA: 06/29/2021 ?PCP: Pcp, No  ? ? 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  He was admitted on 2/3 and underwent ACDF of C3-4, C4-5, C5-5, and C6-7.  Post-operatively he was progressing slowly with residual weakness in both hands but improving lower extremity strength and function.  He developed some difficulty swallowing on 2/5 and SLP ordered and placed on thickened liquids.   Overnight 2/6, patient with increased difficulty swallowing and was made NPO but also noted to have dysarthria and difficulty moving extremities with numbness.  SLP evaluated and found to be moderate aspiration risk.   Also progressively tachycardic, tachypneic, and now febrile.  Code stroke activated and taken for CT/ CTA head and neck and was given decadron 10mg  once.  NIHSS 18.  CTH was negative for acute findings, and CTA head neck did not reveal any LVO but noted significant prevertebral soft tissue swelling with foci of gas present at the ventral epidural space at C4-5, small collection not excluded. On 2/7, patient with worsening confusion and concern for airway involvement, PCCM consulted and remained in the intensive care unit until he is transferred to the floor on 3/2 and PCCM requested transition to [facility] for medical assistance while patient remains under the neurosurgery service. ? ?Significant Hospital Events: ?2/3 ACDF C3-4, C4-5, C5-5, and C6-7 w/ Dr. Annette [name] ?2/6 SLP eval for difficulty swallowing ?2/7 Code stroke overnight, neg for LVO, CT showing soft tissue swelling.  PCCM consulted for concern of airway  management and AMS.  Went for evacuation of hematoma  ?2/18 CT Abd/Pelvis: showing bilateral segmental Pes, started on heparin and showing worsening pneumonia, febrile to  103.  PCT rising, 34.6, restarted on abx ?2/19 possible aspiration w/ severe hypoxia; intubated; Switching from heparin to angiomax given subtherapeutic levels despite increase in rate. ?2/20 Echo shows normal LV systolic function. There was notation of a + McConnell's sign c/w a large pulmonary embolus which is consistent with the finding of bilateral segmental pulmonary emboli noted on CT 07/14/2021. ?2/21 rash appreciated on back; stopped cefepim/vanc switched to zosyn; required low dose levo with increase in fentanyl ?2/22 remains intubated; unable to extubate to do increase RR and thick secretions ?2/23 [name] infusion added ?2/24: weaning fentanyl.  ?2/26 extubated ?2/28 tachycardia persists ?3/2: Modified barium swallow, continues n.p.o. with core track in place, likely will need PEG tube. ?3/6: s/p IR G-tube placement ?Now medically clear awaiting SNF placement. ?  ? ? ?Assessment & Plan: ?  ?Assessment and Plan: ?* Cervical myelopathy ([place]); severe cervical spinal stenosis with cord compression s/p ACDF [id number] on 2/3, complicated by epidural hematoma s/p evacuation 2/7 ?Patient initially presented s/p recent fall with progressive upper and lower extremity weakness and tremors, He was found to have critical multilevel cervical spinal stenosis @ C3-3 6 with spinal cord compression and spinal cord signal change and underwent ACDF C3-4, C4-5, C5-5, and C6/7 by neurosurgery Dr. Trenton [name] on 06/29/2021.  Postoperative leg complicated by epidural hematoma s/p evacuation on 2/7. ?--Continue PT/OT/SLP efforts, Awaiting SNF; but given improving strength may be able to progress to home with home health ?--Further as per neurosurgery recommendation. ? ?  Acute respiratory failure with hypoxia ([place]) ?Likely Multifactorial with significant dysphagia and  recurrent aspiration pneumonia events during initial hospitalization following ACDF surgery with postoperative complications of postoperative cervical hematoma.  Patient did require ventilatory support in the intensive care unit and was successfully extubated on 07/22/2021.  Patient completed extensive course of empiric antibiotics.  Oxygen now weaned off, Now on room air. ?--Continue Aspiration precautions ? ? ? ? ?Acute pulmonary embolism ([place]) ?CTA chest showed bilateral segmental PE.  LE [place] negative for DVT.   ?TTE with normal LV systolic function.  Transitioned Lovenox to Eliquis. ?Oxygen now weaned off. ? ?Acute metabolic encephalopathy ?CT head, MRI brain, EEG, TSH, B12, ammonia unrevealing.   ?Etiology likely multifactorial with acute respiratory failure sec. to aspiration pneumonia, bilateral pulmonary embolism.   ?Completed course of antibiotics. ?--Discontinued Seroquel on 3/16 ?--Discontinued clonazepam on 3/17 ? ?Severe sepsis due to recurrent aspiration pneumonia ?Likely recurrent issue during hospitalization, remains n.p.o. due to continued aspiration risk per SLP.  Completed extensive course of antibiotics while under ICU care. S/p IR gtube 3/6. ?--Continue tube feeds ?--Continue aspiration precautions ?--Continue SLP efforts while inpatient ? ?Sinus tachycardia ?Multifactorial including sepsis, dehydration, PE, agitation.   ?--Continue metoprolol 100 mg twice daily ? ? ? ?Elevated liver enzymes ?Etiology likely secondary to sepsis from aspiration pneumonia as above.  Acute hepatitis panel negative.  RUQ ultrasound unremarkable. ?Continue monitor LFTs intermittently ? ?Hyponatremia ?Etiology likely secondary to dehydration.  Urine sodium low.  Continue tube feeds. ? ? ?Tobacco use disorder ?Encouraged cessation. ?--Nicotine patch; decreased to 7 mg on 3/15 ? ?Physical deconditioning ?Significant weakness in all extremities as a result of cervical myelopathy and acute illness.  Slowly improving while  inpatient. ?--Continue PT/OT efforts, will need SNF placement; difficult placement per TOC but may be able to continue gradual improvement inpatient for possible transition home with home health at some point. ? ?Urinary retention ?Foley catheter discontinued on 08/09/2021 with no current signs of urinary retention. ?--Continue bethanechol ?--Continue bladder scan as needed ?--Continue monitor urinary output ? ?Protein-calorie malnutrition, severe ([place]) ?As evidenced by poor p.o. intake, significant muscle mass and subcu fat loss and significant weight loss (about 16 pounds since this hospitalization). ?Nutrition Problem: Severe Malnutrition (in the context of social/environmental circumstances) ?Etiology:  (inadequate energy intake) ?Signs/Symptoms: mild fat depletion, severe muscle depletion, severe muscle depletion ?Interventions: Refer to RD note for recommendations  ?--Dietitian following, appreciate assistance.   ?--s/p IR gtube 3/6.  ?--Continue tube feeds ?--continue SLP efforts ? ?Hyperglycemia-resolved as of 07/12/2021 ?Likely due to steroid.  Resolved.  A1c 5.2%. ? ? ? ? ?DVT prophylaxis: SCD's Start: 06/29/21 1231 ?apixaban (ELIQUIS) tablet 5 mg  ?  Code Status: Full Code ?Family Communication: No family present at bedside this morning ? ?Disposition Plan:  ?Level of care: Med-Surg ?Status is: Inpatient ? ?Remains inpatient appropriate because: Pending SNF placement, will be difficult per TOC ? ? ?Procedures:  ?ACDF C3-7, neurosurgery Dr. Trenton [name] 2/3 ?Hematoma evacuation 2/7 ?Vascular duplex ultrasound bilateral lower extremities 2/19 ?TTE 2/20 ?TTE 3/1 ?IR G-tube placement 3/6 ? ?Antimicrobials:  ?Vancomycin 2/7 - 2/8; 2/18 - 2/21 ?Zosyn 2/21 -2/23 ?Ampicillin 2/7 -2/11 ?Metronidazole 2/7 - 2/8; 2/17 -2/18 ?Cefepime 2/18 - 2/21 ?Perioperative cefazolin ? ?Subjective: ?Patient seen examined bedside, resting comfortably.  No complaints this morning.  Trying to do some physical therapy exercises while lying in  the bed.  Making steady progress with PT yesterday, now standing to get to chair with 2 person assist. No family present.  Awaiting SNF placement and pending Medicaid per TOC.  Denies headache, no chest pain, no sho

## 2021-08-15 DIAGNOSIS — G959 Disease of spinal cord, unspecified: Secondary | ICD-10-CM

## 2021-08-15 DIAGNOSIS — R7989 Other specified abnormal findings of blood chemistry: Secondary | ICD-10-CM | POA: Diagnosis not present

## 2021-08-15 LAB — COMPREHENSIVE METABOLIC PANEL
ALT: 58 U/L — ABNORMAL HIGH (ref 0–44)
AST: 43 U/L — ABNORMAL HIGH (ref 15–41)
Albumin: 3 g/dL — ABNORMAL LOW (ref 3.5–5.0)
Alkaline Phosphatase: 178 U/L — ABNORMAL HIGH (ref 38–126)
Anion gap: 8 (ref 5–15)
BUN: 30 mg/dL — ABNORMAL HIGH (ref 6–20)
CO2: 28 mmol/L (ref 22–32)
Calcium: 10.1 mg/dL (ref 8.9–10.3)
Chloride: 95 mmol/L — ABNORMAL LOW (ref 98–111)
Creatinine, Ser: 0.72 mg/dL (ref 0.61–1.24)
GFR, Estimated: >60 mL/min (ref >60)
Glucose, Bld: 100 mg/dL — ABNORMAL HIGH (ref 70–99)
Potassium: 4.3 mmol/L (ref 3.5–5.1)
Sodium: 131 mmol/L — ABNORMAL LOW (ref 135–145)
Total Bilirubin: 0.6 mg/dL (ref 0.3–1.2)
Total Protein: 8.4 g/dL — ABNORMAL HIGH (ref 6.5–8.1)

## 2021-08-15 LAB — CBC
HCT: 30.4 % — ABNORMAL LOW (ref 39.0–52.0)
Hemoglobin: 9.7 g/dL — ABNORMAL LOW (ref 13.0–17.0)
MCH: 29.6 pg (ref 26.0–34.0)
MCHC: 31.9 g/dL (ref 30.0–36.0)
MCV: 92.7 fL (ref 80.0–100.0)
Platelets: 533 10*3/uL — ABNORMAL HIGH (ref 150–400)
RBC: 3.28 MIL/uL — ABNORMAL LOW (ref 4.22–5.81)
RDW: 14.6 % (ref 11.5–15.5)
WBC: 7.8 10*3/uL (ref 4.0–10.5)
nRBC: 0 % (ref 0.0–0.2)

## 2021-08-15 LAB — GLUCOSE, CAPILLARY
Glucose-Capillary: 119 mg/dL — ABNORMAL HIGH (ref 70–99)
Glucose-Capillary: 110 mg/dL — ABNORMAL HIGH (ref 70–99)
Glucose-Capillary: 141 mg/dL — ABNORMAL HIGH (ref 70–99)
Glucose-Capillary: 109 mg/dL — ABNORMAL HIGH (ref 70–99)
Glucose-Capillary: 127 mg/dL — ABNORMAL HIGH (ref 70–99)

## 2021-08-15 LAB — MAGNESIUM: Magnesium: 1.8 mg/dL (ref 1.7–2.4)

## 2021-08-15 NOTE — Progress Notes (Signed)
? ? ? [facility] ?                                                                            ? ? ?Brian [name], is a 61 y.o. male, DOB - Oct 15, 1960, [id number] ?Admit date - 06/29/2021    ?Outpatient Primary MD for the patient is Pcp, No ? ?LOS - 47  days ? ? ? ?Brief summary  ? 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  He was admitted on 2/3 and underwent ACDF of C3-4, C4-5, C5-5, and C6-7.  Post-operatively he was progressing slowly with residual weakness in both hands but improving lower extremity strength and function.  He developed some difficulty swallowing on 2/5 and SLP ordered and placed on thickened liquids.   Overnight 2/6, patient with increased difficulty swallowing and was made NPO but also noted to have dysarthria and difficulty moving extremities with numbness.  SLP evaluated and found to be moderate aspiration risk.   Also progressively tachycardic, tachypneic, and now febrile.  Code stroke activated and taken for CT/ CTA head and neck and was given decadron 10mg  once.  NIHSS 18.  CTH was negative for acute findings, and CTA head neck did not reveal any LVO but noted significant prevertebral soft tissue swelling with foci of gas present at the ventral epidural space at C4-5, small collection not excluded. On 2/7, patient with worsening confusion and concern for airway involvement, PCCM consulted and remained in the intensive care unit until he is transferred to the floor on 3/2 and PCCM requested transition to Brian [name] for medical assistance while patient remains under the neurosurgery service. ? ?Significant Hospital Events: ?2/3 ACDF C3-4, C4-5, C5-5, and C6-7  w/ Dr. Annette [name] ?2/6 SLP eval for difficulty swallowing ?2/7 Code stroke overnight, neg for LVO, CT showing soft tissue swelling.  PCCM consulted for concern of airway management and AMS.  Went for evacuation of hematoma  ?2/18 CT Abd/Pelvis: showing bilateral segmental Pes, started on heparin and showing worsening pneumonia, febrile to  103.  PCT rising, 34.6, restarted on abx ?2/19 possible aspiration w/ severe hypoxia; intubated; Switching from heparin to angiomax given subtherapeutic levels despite increase in rate. ?2/20 Echo shows normal LV systolic function. There was notation of a + McConnell's sign c/w a large pulmonary embolus which is consistent with the finding of bilateral segmental pulmonary emboli noted on CT 07/14/2021. ?2/21 rash appreciated on back; stopped cefepim/vanc switched to zosyn; required low dose levo with increase in fentanyl ?2/22 remains intubated; unable to extubate to do increase RR and thick secretions ?2/23 [name] infusion added ?2/24: weaning fentanyl.  ?2/26 extubated ?2/28 tachycardia persists ?3/2: Modified barium swallow, continues n.p.o. with core track in place, likely will need PEG tube. ?3/6: s/p IR G-tube placement ?Now medically clear awaiting SNF placement. ? ? ? ?Assessment & Plan  ? ? ?Assessment and Plan: ?* Cervical myelopathy (Brian [name]); severe cervical spinal stenosis with cord  compression s/p ACDF [id number] on 2/3, complicated by epidural hematoma s/p evacuation 2/7 ?Patient initially presented s/p recent fall with progressive upper and lower extremity weakness and tremors, He was found to have critical multilevel cervical spinal stenosis @ C3-3 6 with spinal cord compression and spinal cord signal change and underwent ACDF C3-4, C4-5, C5-5, and C6/7 by neurosurgery Dr. Trenton [name] on 06/29/2021.  Postoperative leg complicated by epidural hematoma s/p evacuation on 2/7. ?RECOMMENDATIONS  as per Neuro surgery.  ?Therapy eval recommending SNF.  ?Toc on board. ? ?Acute respiratory  failure with hypoxia (Brian [name]) ?Likely Multifactorial with significant dysphagia and recurrent aspiration pneumonia events during initial hospitalization following ACDF surgery with postoperative complications of postoperative cervical hematoma.  Patient did require ventilatory support in the intensive care unit and was successfully extubated on 07/22/2021.  Patient completed extensive course of empiric antibiotics.   ?Weaned off oxygen and [name] on RA.  ? ? ? ?Acute pulmonary embolism (Brian [name]) ?CTA chest showed bilateral segmental PE.  LE [place] negative for DVT.   ?Echocardiogram does not show any right heart strain.  ?Transitioned to eliquis for anti coagulation.  ? ?Acute metabolic encephalopathy ?CT head, MRI brain, EEG, TSH, B12, ammonia unrevealing.   ?Etiology likely multifactorial with acute respiratory failure sec. to aspiration pneumonia, bilateral pulmonary embolism.   ? Completed the course of antibiotics.  ?Pt alert and appears to be at baseline.  ? ? ?Severe sepsis due to recurrent aspiration pneumonia ?Likely recurrent issue during hospitalization, remains n.p.o. due to continued aspiration risk per SLP.  Completed extensive course of antibiotics while under ICU care. S/p IR gtube 3/6. ?Continue tube feeds  and aspiration precautions.  ?Pt afebrile and wbc count is wnl.  ? ?Sinus tachycardia ?Multifactorial including sepsis, dehydration, PE, agitation.   ?Controlled on metoprolol.  ? ? ? ?Elevated liver enzymes ?Etiology likely secondary to sepsis from aspiration pneumonia as above.   ?Improving.  ?Acute hepatitis panel is negative.  ?RUQ IS unremarkable.  ?Recheck liver panel in 4 weeks.  ? ?Hyponatremia ?Improving.  ?Etiology likely secondary to dehydration.  Urine sodium low.  Continue with tube feeds.  ? ? ?Tobacco use disorder ?Encouraged cessation. ?--Nicotine patch; decreased to 7 mg on 3/15 ? ?Physical deconditioning ?Significant weakness in all extremities as a result of cervical myelopathy and acute  illness.  Slowly improving while inpatient. ?Therapy eval recommending SNF.  ?TOC on board .  ? ?Urinary retention ?Foley catheter discontinued on 08/09/2021 with no current signs of urinary retention. ?--Continue bethanechol ? ? ?Protein-calorie malnutrition, severe ([place]) ?As evidenced by poor p.o. intake, significant muscle mass and subcu fat loss and significant weight loss (about 16 pounds since this hospitalization). ?Nutrition Problem: Severe Malnutrition (in the context of social/environmental circumstances) ?Etiology:  (inadequate energy intake) ?Signs/Symptoms: mild fat depletion, severe muscle depletion, severe muscle depletion ?Interventions: Refer to RD note for recommendations  ?--Dietitian following, appreciate assistance.   ?--s/p IR gtube 3/6.  ?--Continue tube feeds ?--continue SLP efforts ? ?Hyperglycemia ?Probably secondary to steroids.  ?A1c is 5.2% ? ? ?Anemia of chronic disease:  ?Hemoglobin [name] around 9.  ?Continue to monitor.  ? ? ? ?Thrombocytosis:  ?Probably reactive.  ? ?  ? ? ?RN Pressure Injury Documentation: ?  ? ?Malnutrition Type: ? ?Nutrition Problem: Severe Malnutrition ?Etiology: social / environmental circumstances ? ? ?Malnutrition Characteristics: ? ?Signs/Symptoms: severe muscle depletion, severe fat depletion ? ? ?Nutrition Interventions: ? ?Interventions: Tube feeding, Prostat, MVI ? ?Estimated body mass index is 15.88 kg/m? as calculated from the  following: ?  Height as of this encounter: 5\' 10"  (1.778 m). ?  Weight as of this encounter: 50.2 kg. ? ?Code Status: full code.  ?DVT Prophylaxis:  SCD's Start: 06/29/21 1231 ?apixaban (ELIQUIS) tablet 5 mg  ? ?Level of Care: Level of care: Med-Surg ? ? ? ? ?Antimicrobials:  ? ?Anti-infectives (From admission, onward)  ? ? Start     Dose/Rate Route Frequency Ordered Stop  ? 07/30/21 0937  ceFAZolin (ANCEF) 2-4 GM/100ML-% IVPB       ?Note to Pharmacy: Domenick Bookbinder E: cabinet override  ?    07/30/21 0937 07/30/21 2144  ?  07/30/21 0937  ceFAZolin (ANCEF) IVPB 2g/100 mL premix       ? over 30 Minutes Intravenous Continuous PRN 07/30/21 0948 07/30/21 0937  ? 07/17/21 1300  piperacillin-tazobactam (ZOSYN) IVPB 3.375 g       ? 3.375 g

## 2021-08-15 NOTE — Progress Notes (Signed)
No great changes in status.  Patient continues to make slow steady gains with therapy and time.  Continue supportive efforts.  Continue working toward discharge plan. ?

## 2021-08-16 ENCOUNTER — Inpatient Hospital Stay (HOSPITAL_COMMUNITY): Payer: 59

## 2021-08-16 DIAGNOSIS — G959 Disease of spinal cord, unspecified: Secondary | ICD-10-CM | POA: Diagnosis not present

## 2021-08-16 DIAGNOSIS — R7989 Other specified abnormal findings of blood chemistry: Secondary | ICD-10-CM | POA: Diagnosis not present

## 2021-08-16 LAB — GLUCOSE, CAPILLARY
Glucose-Capillary: 101 mg/dL — ABNORMAL HIGH (ref 70–99)
Glucose-Capillary: 105 mg/dL — ABNORMAL HIGH (ref 70–99)
Glucose-Capillary: 108 mg/dL — ABNORMAL HIGH (ref 70–99)
Glucose-Capillary: 116 mg/dL — ABNORMAL HIGH (ref 70–99)
Glucose-Capillary: 117 mg/dL — ABNORMAL HIGH (ref 70–99)
Glucose-Capillary: 122 mg/dL — ABNORMAL HIGH (ref 70–99)
Glucose-Capillary: 131 mg/dL — ABNORMAL HIGH (ref 70–99)

## 2021-08-16 MED ORDER — METOPROLOL TARTRATE 25 MG/10 ML ORAL SUSPENSION
100.0000 mg | Freq: Two times a day (BID) | ORAL | Status: DC
  Administered 2021-08-18 – 2021-09-04 (×33): 100 mg
  Filled 2021-08-16 (×37): qty 40

## 2021-08-16 MED ORDER — METOPROLOL TARTRATE 12.5 MG HALF TABLET
12.5000 mg | ORAL_TABLET | Freq: Two times a day (BID) | ORAL | Status: AC
  Administered 2021-08-17: 12.5 mg via ORAL
  Filled 2021-08-16: qty 1

## 2021-08-16 NOTE — Progress Notes (Signed)
Orders have been placed with IR and IV team. Pain medication was requested to MD. Oncoming RN awaiting orders. This RN placed IV consult. MD reported she would consult IR. Oncoming RN awaiting further instruction.  ?

## 2021-08-16 NOTE — Progress Notes (Signed)
Modified Barium Swallow Progress Note ? ?Patient Details  ?Name: Brian [name] ?MRN: [id number] ?Date of Birth: 09-13-1960 ? ?Today's Date: 08/16/2021 ? ?Modified Barium Swallow completed.  Full report located under Chart Review in the Imaging Section. ? ?Brief recommendations include the following: ? ?Clinical Impression ? Pt's swallow ability has improved from previous MBS 07/26/21 and he was alert and lucid. His oropharyngeal dysphagia is mild with laryngeal penetration and no aspiration this study. Lingual propulsion with solids was effortful with mild-moderate delays and there was mild lingual resdidue with all consistencies but puree. Pharyngeal phase was marked by incomplete laryngeal closure with penetration of thin (PAS 3, 5) without sensation to vocal cords. Strategies including chin tuck then left head turn were not were ineffective and increase volume of material and deeper into vestibule on vocal cords. Nectar thick was penetrated x 1 while using a straw (PAS 3) and cup sips did not intrude airway. Minimal vallecular residue and mild-mod pyriform sinus resudue due to intermittently reduced pharyngeal contraction and shortening. Second spontaneous swallows inconsistently effective to reduce residue. Mild stasis observed when esophagus scanned and question of minimal retrograde movement. Recommend Dys 2, nectar thick liquids, no straws, intermittent throat clear, pills via PEG and pt will need full supervision and assist with feeding. ?  ?Swallow Evaluation Recommendations ? ?   ? ? SLP Diet Recommendations: Dysphagia 2 (Fine chop) solids;Nectar thick liquid ? ? Liquid Administration via: Cup;No straw ? ? Medication Administration: Via alternative means ? ? Supervision: Full assist for feeding;Staff to assist with self feeding;Full supervision/cueing for compensatory strategies ? ? Compensations: Slow rate;Small sips/bites;Clear throat intermittently ? ? Postural Changes: Seated upright at 90 degrees ? ?  Oral Care Recommendations: Oral care BID ? ?   ? ? ? ?[name] ?08/16/2021,2:17 PM ?

## 2021-08-16 NOTE — Progress Notes (Signed)
? ? ? [facility] ?                                                                            ? ? ?Brian [name], is a 61 y.o. male, DOB - 06-08-1960, [id number] ?Admit date - 06/29/2021    ?Outpatient Primary MD for the patient is Pcp, No ? ?LOS - 48  days ? ? ? ?Brief summary  ? 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  He was admitted on 2/3 and underwent ACDF of C3-4, C4-5, C5-5, and C6-7.  Post-operatively he was progressing slowly with residual weakness in both hands but improving lower extremity strength and function.  He developed some difficulty swallowing on 2/5 and SLP ordered and placed on thickened liquids.   Overnight 2/6, patient with increased difficulty swallowing and was made NPO but also noted to have dysarthria and difficulty moving extremities with numbness.  SLP evaluated and found to be moderate aspiration risk.   Also progressively tachycardic, tachypneic, and now febrile.  Code stroke activated and taken for CT/ CTA head and neck and was given decadron 10mg  once.  NIHSS 18.  CTH was negative for acute findings, and CTA head neck did not reveal any LVO but noted significant prevertebral soft tissue swelling with foci of gas present at the ventral epidural space at C4-5, small collection not excluded. On 2/7, patient with worsening confusion and concern for airway involvement, PCCM consulted and remained in the intensive care unit until he is transferred to the floor on 3/2 and PCCM requested transition to [facility] for medical assistance while patient remains under the neurosurgery service. ? ?Significant Hospital Events: ?2/3 ACDF C3-4, C4-5, C5-5, and C6-7  w/ Dr. Annette Stable ?2/6 SLP eval for difficulty swallowing ?2/7 Code stroke overnight, neg for LVO, CT showing soft tissue swelling.  PCCM consulted for concern of airway management and AMS.  Went for evacuation of hematoma  ?2/18 CT Abd/Pelvis: showing bilateral segmental Pes, started on heparin and showing worsening pneumonia, febrile to  103.  PCT rising, 34.6, restarted on abx ?2/19 possible aspiration w/ severe hypoxia; intubated; Switching from heparin to angiomax given subtherapeutic levels despite increase in rate. ?2/20 Echo shows normal LV systolic function. There was notation of a + McConnell's sign c/w a large pulmonary embolus which is consistent with the finding of bilateral segmental pulmonary emboli noted on CT 07/14/2021. ?2/21 rash appreciated on back; stopped cefepim/vanc switched to zosyn; required low dose levo with increase in fentanyl ?2/22 remains intubated; unable to extubate to do increase RR and thick secretions ?2/23 [name] infusion added ?2/24: weaning fentanyl.  ?2/26 extubated ?2/28 tachycardia persists ?3/2: Modified barium swallow, continues n.p.o. with core track in place, likely will need PEG tube. ?3/6: s/p IR G-tube placement ?Now medically clear awaiting SNF placement. ? ? ? ?Assessment & Plan  ? ? ?Assessment and Plan: ?* Cervical myelopathy ([place]); severe cervical spinal stenosis with cord  compression s/p ACDF C3-7 on 2/3, complicated by epidural hematoma s/p evacuation 2/7 ?Patient initially presented s/p recent fall with progressive upper and lower extremity weakness and tremors, He was found to have critical multilevel cervical spinal stenosis @ C3-3 6 with spinal cord compression and spinal cord signal change and underwent ACDF C3-4, C4-5, C5-5, and C6/7 by neurosurgery Dr. Dutch Quint on 06/29/2021.  Postoperative leg complicated by epidural hematoma s/p evacuation on 2/7. ?RECOMMENDATIONS  as per Neuro surgery.  ?Therapy eval recommending SNF.  ?Toc on board. ? ?Acute respiratory  failure with hypoxia (HCC) ?Likely Multifactorial with significant dysphagia and recurrent aspiration pneumonia events during initial hospitalization following ACDF surgery with postoperative complications of postoperative cervical hematoma.  Patient did require ventilatory support in the intensive care unit and was successfully extubated on 07/22/2021.  Patient completed extensive course of empiric antibiotics.   ?Weaned off oxygen and stable on RA.  ? ? ? ?Acute pulmonary embolism (HCC) ?CTA chest showed bilateral segmental PE.  LE [place] negative for DVT.   ?Echocardiogram does not show any right heart strain.  ?Transitioned to eliquis for anti coagulation.  ? ?Acute metabolic encephalopathy ?CT head, MRI brain, EEG, TSH, B12, ammonia unrevealing.   ?Etiology likely multifactorial with acute respiratory failure sec. to aspiration pneumonia, bilateral pulmonary embolism.   ? Completed the course of antibiotics.  ?Pt alert and answering questions appropriately.  ? ? ?Severe sepsis due to recurrent aspiration pneumonia ?Likely recurrent issue during hospitalization, remains n.p.o. due to continued aspiration risk per SLP.  Completed extensive course of antibiotics while under ICU care. S/p IR gtube 3/6. ?Continue tube feeds  and aspiration precautions.  ?Pt afebrile and wbc count is wnl.  ? ?Sinus tachycardia ?Multifactorial including sepsis, dehydration, PE, agitation.   ?Controlled on metoprolol.  ? ? ?Elevated liver enzymes ?Etiology likely secondary to sepsis from aspiration pneumonia as above.   ?Improving.  ?Acute hepatitis panel is negative.  ?RUQ IS unremarkable.  ?Recheck liver panel in 4 weeks.  ? ?Hyponatremia ?Improving.  ?Etiology likely secondary to dehydration.  Urine sodium low.  Continue with tube feeds.  ? ? ?Tobacco use disorder ?Encouraged cessation. ?--Nicotine patch; decreased to 7 mg on 3/15 ? ?Physical deconditioning ?Significant weakness in all extremities as a result of cervical myelopathy and  acute illness.  Slowly improving while inpatient. ?Therapy eval recommending SNF.  ?TOC on board .  ? ?Urinary retention ?Foley catheter discontinued on 08/09/2021 with no current signs of urinary retention. ?--Continue bethanechol. ?-- able to void without any issues.  ? ? ?Protein-calorie malnutrition, severe (HCC) ?As evidenced by poor p.o. intake, significant muscle mass and subcu fat loss and significant weight loss (about 16 pounds since this hospitalization). ?Nutrition Problem: Severe Malnutrition (in the context of social/environmental circumstances) ?Etiology:  (inadequate energy intake) ?Signs/Symptoms: mild fat depletion, severe muscle depletion, severe muscle depletion ?Interventions: Refer to RD note for recommendations  ?--Dietitian following, appreciate assistance.   ?--s/p IR gtube 3/6. Continue with tube feeds.  ? ?Hyperglycemia ?Probably secondary to steroids.  ?A1c is 5.2% ? ? ?Anemia of chronic disease:  ?Hemoglobin stable around 9.  ?Continue to monitor.  ? ? ? ?Thrombocytosis:  ?Probably reactive.  ? ?  ? ? ? ?  ? ?Malnutrition Type: ? ?Nutrition Problem: Severe Malnutrition ?Etiology: social / environmental circumstances ? ? ?Malnutrition Characteristics: ? ?Signs/Symptoms: severe muscle depletion, severe fat depletion ? ? ?Nutrition Interventions: ? ?Interventions: Tube feeding, Prostat, MVI ? ?Estimated body mass index is 15.47 kg/m? as calculated from the  following: ?  Height as of this encounter: 5\' 10"  (1.778 m). ?  Weight as of this encounter: 48.9 kg. ? ?Code Status: full code.  ?DVT Prophylaxis:  SCD's Start: 06/29/21 1231 ?apixaban (ELIQUIS) tablet 5 mg  ? ?Level of Care: Level of care: Med-Surg ? ? ? ? ?Antimicrobials:  ? ?Anti-infectives (From admission, onward)  ? ? Start     Dose/Rate Route Frequency Ordered Stop  ? 07/30/21 0937  ceFAZolin (ANCEF) 2-4 GM/100ML-% IVPB       ?Note to Pharmacy: Domenick Bookbinder E: cabinet override  ?    07/30/21 0937 07/30/21 2144  ? 07/30/21 0937   ceFAZolin (ANCEF) IVPB 2g/100 mL premix       ? over 30 Minutes Intravenous Continuous PRN 07/30/21 0948 07/30/21 0937  ? 07/17/21 1300  piperacillin-tazobactam (ZOSYN) IVPB 3.375 g       ? 3.375 g ?12.5 mL

## 2021-08-16 NOTE — Progress Notes (Signed)
No new issues or problems.  Patient without significant pain.  Continues to slowly regain motor function and coordination.  Continue rehabilitation efforts. ?

## 2021-08-16 NOTE — Progress Notes (Signed)
Occupational Therapy Treatment ?Patient Details ?Name: Brian [name] ?MRN: [id number] ?DOB: 1960/06/12 ?Today's Date: 08/16/2021 ? ? ?History of present illness Pt is a 61 y/o male admitted 06/29/21 following C3-7 ACDF after progressive cervical myelopathy symptoms including ataxia, bil UE and LE shaking and weakness, and loss of function in his hands with subsequent fall. Pt initially had improvement in neurological symptoms, but postoperative course was complicated by dysphasia and ataxia secondary to and epidural hematoma with evacuation of hematoma on 2/7. Course complicated by delirium secondary to encephalopathy, sepsis secondary to aspiration PNA, reintubated 2/19-2/26 for airway protection, bilat segmental PEs 2/18. PEG placed 3/6. PMHx includes tobacco and EtOH use disorder. ?  ?OT comments ? Patient received in bed and has new dysphagia 2 diet with tray.  Patient agreeable to transfer to chair to address self feeding. Patient was mod assist to get to EOB and for squat pivot transfer to recliner. Patient having difficulty holding onto utensil and a red foam added with increased grip but patient continued to require hand over hand assist to feed self. Patient feed self 2-3 bits before stating he was full.  Yellow therapy putty exercises performed to increase grip strength to assist with self feeding. Acute OT to continue to follow.   ? ?Recommendations for follow up therapy are one component of a multi-disciplinary discharge planning process, led by the attending physician.  Recommendations may be updated based on patient status, additional functional criteria and insurance authorization. ?   ?Follow Up Recommendations ? Skilled nursing-short term rehab (<3 hours/day)  ?  ?Assistance Recommended at Discharge Frequent or constant Supervision/Assistance  ?Patient can return home with the following ? Two people to help with walking and/or transfers;Direct supervision/assist for medications management;Assist for  transportation;Help with stairs or ramp for entrance;Assistance with feeding;Two people to help with bathing/dressing/bathroom;Assistance with cooking/housework;Direct supervision/assist for financial management ?  ?Equipment Recommendations ? Wheelchair (measurements OT);Wheelchair cushion (measurements OT);Hospital bed;BSC/3in1;Tub/shower seat  ?  ?Recommendations for Other Services   ? ?  ?Precautions / Restrictions Precautions ?Precautions: Fall ?Required Braces or Orthoses: Cervical Brace ?Cervical Brace: Soft collar;For comfort ?Restrictions ?Weight Bearing Restrictions: No  ? ? ?  ? ?Mobility Bed Mobility ?Overal bed mobility: Needs Assistance ?Bed Mobility: Rolling, Sidelying to Sit ?Rolling: Supervision ?Sidelying to sit: Mod assist ?  ?  ?  ?General bed mobility comments: verbal cues for rail use ?  ? ?Transfers ?Overall transfer level: Needs assistance ?Equipment used: None ?Transfers: Bed to chair/wheelchair/BSC ?  ?  ?Squat pivot transfers: Mod assist ?  ?  ?  ?General transfer comment: squat pivot performed due to patient having difficulty coming to full stand ?  ?  ?Balance Overall balance assessment: Needs assistance ?Sitting-balance support: Single extremity supported, Feet supported ?Sitting balance-Leahy Scale: Poor ?Sitting balance - Comments: sitting on EOB with rail for support ?  ?  ?  ?  ?  ?  ?  ?  ?  ?  ?  ?  ?  ?  ?  ?   ? ?ADL either performed or assessed with clinical judgement  ? ?ADL Overall ADL's : Needs assistance/impaired ?Eating/Feeding: Moderate assistance;With adaptive utensils;Sitting ?Eating/Feeding Details (indicate cue type and reason): Patient now on dysphagia 2 diet.  Patient required hand over hand with built up utensils ?Grooming: Wash/dry hands;Wash/dry face;Minimal assistance;Sitting ?Grooming Details (indicate cue type and reason): able to wash face but difficulty washing hand due to difficulty with finger extension ?  ?  ?  ?  ?  ?  ?  ?  ?  ?  ?  ?  ?  ?  ?  ?  General  ADL Comments: patient seen for self feeding due to now on new diet ?  ? ?Extremity/Trunk Assessment Upper Extremity Assessment ?RUE Deficits / Details: improved AROM ?RUE Sensation: decreased light touch;decreased proprioception ?RUE Coordination: decreased fine motor;decreased gross motor ?LUE Deficits / Details: increased AROM ?LUE Sensation: decreased light touch ?LUE Coordination: decreased fine motor;decreased gross motor ?  ?  ?  ?  ?  ? ?Vision   ?  ?  ?Perception   ?  ?Praxis   ?  ? ?Cognition Arousal/Alertness: Awake/alert ?Behavior During Therapy: Flat affect ?Overall Cognitive Status: No family/caregiver present to determine baseline cognitive functioning ?Area of Impairment: Problem solving, Awareness ?  ?  ?  ?  ?  ?  ?  ?  ?Orientation Level: Disoriented to, Time, Situation ?Current Attention Level: Sustained ?Memory: Decreased short-term memory, Decreased recall of precautions ?Following Commands: Follows one step commands with increased time ?Safety/Judgement: Decreased awareness of safety, Decreased awareness of deficits ?Awareness: Intellectual ?Problem Solving: Requires verbal cues, Requires tactile cues, Difficulty sequencing ?General Comments: unable to recall  his PT session from earlier today ?  ?  ?   ?Exercises Exercises: Other exercises ?Other Exercises ?Other Exercises: therapy putty exercises with yellow therapy putty ? ?  ?Shoulder Instructions   ? ? ?  ?General Comments    ? ? ?Pertinent Vitals/ Pain       Pain Assessment ?Pain Assessment: No/denies pain ?Pain Intervention(s): Monitored during session ? ?Home Living   ?  ?  ?  ?  ?  ?  ?  ?  ?  ?  ?  ?  ?  ?  ?  ?  ?  ?  ? ?  ?Prior Functioning/Environment    ?  ?  ?  ?   ? ?Frequency ? Min 2X/week  ? ? ? ? ?  ?Progress Toward Goals ? ?OT Goals(current goals can now be found in the care plan section) ? Progress towards OT goals: Progressing toward goals ? ?Acute Rehab OT Goals ?Patient Stated Goal: get stronger ?OT Goal Formulation:  With patient ?Time For Goal Achievement: 08/20/21 ?Potential to Achieve Goals: Fair ?ADL Goals ?Pt Will Perform Eating: with min assist;with assist to don/doff brace/orthosis;with adaptive utensils;sitting ?Pt Will Perform Grooming: sitting;with min assist ?Pt Will Perform Upper Body Bathing: with mod assist;sitting ?Pt Will Transfer to Toilet: with min assist;stand pivot transfer;bedside commode ?Pt/caregiver will Perform Home Exercise Program: Increased ROM;Increased strength;Both right and left upper extremity;With theraputty;With minimal assist;With written HEP provided ?Additional ADL Goal #1: Pt will grasp objects with R hand and bring to mouth 10/10 trials in prep for self feeding ?Additional ADL Goal #2: Pt will grasp/release 5/5 objects with L hand to increase indep with ADLs  ?Plan Discharge plan remains appropriate   ? ?Co-evaluation ? ? ?   ?  ?  ?  ?  ? ?  ?AM-PAC OT "6 Clicks" Daily Activity     ?Outcome Measure ? ? Help from another person eating meals?: A Lot ?Help from another person taking care of personal grooming?: A Lot ?Help from another person toileting, which includes using toliet, bedpan, or urinal?: Total ?Help from another person bathing (including washing, rinsing, drying)?: Total ?Help from another person to put on and taking off regular upper body clothing?: Total ?Help from another person to put on and taking off regular lower body clothing?: Total ?6 Click Score: 8 ? ?  ?End of Session Equipment Utilized During Treatment: Gait belt ? ?  OT Visit Diagnosis: Other abnormalities of gait and mobility (R26.89);Muscle weakness (generalized) (M62.81);Feeding difficulties (R63.3) ?  ?Activity Tolerance Patient tolerated treatment well ?  ?Patient Left in chair;with call bell/phone within reach;with chair alarm set ?  ?Nurse Communication Mobility status;Need for lift equipment ?  ? ?   ? ?Time: [id number] ?OT Time Calculation (min): 33 min ? ?Charges: OT General Charges ?$OT Visit: 1 Visit ?OT  Treatments ?$Self Care/Home Management : 8-22 mins ?$Therapeutic Exercise: 8-22 mins ? ?Lodema Hong, OTA ?Acute Rehabilitation Services  ?Pager [phone] ?Office [phone] ? ? ?[place] ?08/17/18

## 2021-08-16 NOTE — Progress Notes (Signed)
Physical Therapy Treatment ?Patient Details ?Name: Brian [name] ?MRN: [id number] ?DOB: [birth year] ?Today's Date: 08/16/2021 ? ? ?History of Present Illness Pt is a 61 y/o male admitted 06/29/21 following C3-7 ACDF after progressive cervical myelopathy symptoms including ataxia, bil UE and LE shaking and weakness, and loss of function in his hands with subsequent fall. Pt initially had improvement in neurological symptoms, but postoperative course was complicated by dysphasia and ataxia secondary to and epidural hematoma with evacuation of hematoma on 2/7. Course complicated by delirium secondary to encephalopathy, sepsis secondary to aspiration PNA, reintubated 2/19-2/26 for airway protection, bilat segmental PEs 2/18. PEG placed 3/6. PMHx includes tobacco and EtOH use disorder. ? ?  ?PT Comments  ? ? Pt performed therapeutic exercise in order to strengthen so patient can begin to be more independent. Pt was able to transfer sit to stand with min assist +2 for physical assistance. Pt was mod assist for stand pivot transfer. Pt is able to maintain upright posture in standing with his knees blocked and max assist for balance. Pt request something for ankle stability to help him with standing. Pt exhibited clonus with weightbearing activity and would benefit from ankle stretching. During treatment session patient showed deficits in strength, endurance, activity tolerance. Recommending therapy services at skilled nursing facility to address the previously stated deficits. Will continue to follow acutely with emphasis on bed mobility and transfers to maximize functional mobility, independence, and safety. ?  ?Recommendations for follow up therapy are one component of a multi-disciplinary discharge planning process, led by the attending physician.  Recommendations may be updated based on patient status, additional functional criteria and insurance authorization. ? ?Follow Up Recommendations ? Skilled nursing-short term  rehab (<3 hours/day) ?  ?  ?Assistance Recommended at Discharge Frequent or constant Supervision/Assistance  ?Patient can return home with the following Two people to help with walking and/or transfers;A lot of help with bathing/dressing/bathroom;Assistance with cooking/housework;Assistance with feeding;Direct supervision/assist for medications management;Direct supervision/assist for financial management;Assist for transportation;Help with stairs or ramp for entrance ?  ?Equipment Recommendations ? Wheelchair (measurements PT);Wheelchair cushion (measurements PT);Hospital bed  ?  ?Recommendations for Other Services   ? ? ?  ?Precautions / Restrictions Precautions ?Precautions: Fall ?Required Braces or Orthoses: Cervical Brace ?Cervical Brace: Soft collar;For comfort ?Restrictions ?Weight Bearing Restrictions: No  ?  ? ?Mobility ? Bed Mobility ?Overal bed mobility: Needs Assistance ?Bed Mobility: Rolling, Sidelying to Sit ?Rolling: Supervision ?Sidelying to sit: Mod assist ?  ?  ?Sit to sidelying: Mod assist ?General bed mobility comments: Pt required cuing for bed mobility and mod assist to sit up. pt was able to pull up on therapist's hand with light help with trunk. ?  ? ?Transfers ?Overall transfer level: Needs assistance ?  ?Transfers: Sit to/from Stand, Bed to chair/wheelchair/BSC ?Sit to Stand: Min assist, +2 physical assistance ?Stand pivot transfers: Mod assist ?  ?  ?  ?  ?General transfer comment: Pt required a lot of help with balance but was able to power up during elevated surface sit to stand with LE with UE help. ?Transfer via Lift Equipment: Stedy ? ?Ambulation/Gait ?  ?  ?  ?  ?  ?  ?  ?  ? ? ?Stairs ?  ?  ?  ?  ?  ? ? ?Wheelchair Mobility ?  ? ?Modified Rankin (Stroke Patients Only) ?  ? ? ?  ?Balance   ?  ?  ?  ?  ?  ?  ?  ?  ?  ?  ?  ?  ?  ?  ?  ?  ?  ?  ?  ? ?  ?  Cognition Arousal/Alertness: Awake/alert ?Behavior During Therapy: Flat affect ?Overall Cognitive Status: No family/caregiver present  to determine baseline cognitive functioning ?Area of Impairment: Problem solving, Awareness ?  ?  ?  ?  ?  ?  ?  ?  ?  ?  ?  ?  ?  ?Awareness: Intellectual ?  ?General Comments: Pt was asking question's about why he can not eat food and reported being able to swallow just fine. At times he demonstrated a flat affect but other times had more energetic behavior. ?  ?  ? ?  ?Exercises Other Exercises ?Other Exercises: Trunk rotations in supine ?Other Exercises: Wt shifts side to side with UE support. ?Other Exercises: Hip Ext with manual resistance in sitting ? ?  ?General Comments   ?  ?  ? ?Pertinent Vitals/Pain Pain Assessment ?Pain Assessment: No/denies pain  ? ? ?Home Living   ?  ?  ?  ?  ?  ?  ?  ?  ?  ?   ?  ?Prior Function    ?  ?  ?   ? ?PT Goals (current goals can now be found in the care plan section)   ? ?  ?Frequency ? ? ? Min 2X/week ? ? ? ?  ?PT Plan Current plan remains appropriate  ? ? ?Co-evaluation   ?  ?  ?  ?  ? ?  ?AM-PAC PT "6 Clicks" Mobility   ?Outcome Measure ? Help needed turning from your back to your side while in a flat bed without using bedrails?: A Little ?Help needed moving from lying on your back to sitting on the side of a flat bed without using bedrails?: A Little ?Help needed moving to and from a bed to a chair (including a wheelchair)?: A Lot ?Help needed standing up from a chair using your arms (e.g., wheelchair or bedside chair)?: A Lot ?Help needed to walk in hospital room?: Total ?Help needed climbing 3-5 steps with a railing? : Total ?6 Click Score: 12 ? ?  ?End of Session Equipment Utilized During Treatment: Gait belt ?Activity Tolerance: Patient tolerated treatment well ?Patient left: with call bell/phone within reach;with chair alarm set ?Nurse Communication: Mobility status;Need for lift equipment ?PT Visit Diagnosis: Muscle weakness (generalized) (M62.81);Other symptoms and signs involving the nervous system (R29.898);Difficulty in walking, not elsewhere classified  (R26.2);Other abnormalities of gait and mobility (R26.89) ?  ? ? ?Time: [id number] ?PT Time Calculation (min) (ACUTE ONLY): 36 min ? ?Charges:  $Therapeutic Exercise: 8-22 mins ?$Therapeutic Activity: 8-22 mins          ?          ? ?Quenton Fetter, SPT ? ? ? ?Quenton Fetter ?08/16/2021, 12:13 PM ? ?

## 2021-08-16 NOTE — Progress Notes (Signed)
RN called into the room after patient self removed peg tube from abdomen. RN placed foley in place to keep incision patent. Paged MD awaiting call back. Patient is not in any pain but complaining about not getting comfortable in bed ?

## 2021-08-17 ENCOUNTER — Inpatient Hospital Stay (HOSPITAL_COMMUNITY): Payer: 59

## 2021-08-17 DIAGNOSIS — Z789 Other specified health status: Secondary | ICD-10-CM

## 2021-08-17 DIAGNOSIS — E440 Moderate protein-calorie malnutrition: Secondary | ICD-10-CM | POA: Diagnosis not present

## 2021-08-17 DIAGNOSIS — Z515 Encounter for palliative care: Secondary | ICD-10-CM

## 2021-08-17 DIAGNOSIS — G9341 Metabolic encephalopathy: Secondary | ICD-10-CM | POA: Diagnosis not present

## 2021-08-17 DIAGNOSIS — I2699 Other pulmonary embolism without acute cor pulmonale: Secondary | ICD-10-CM | POA: Diagnosis not present

## 2021-08-17 DIAGNOSIS — G959 Disease of spinal cord, unspecified: Secondary | ICD-10-CM | POA: Diagnosis not present

## 2021-08-17 DIAGNOSIS — R7989 Other specified abnormal findings of blood chemistry: Secondary | ICD-10-CM | POA: Diagnosis not present

## 2021-08-17 HISTORY — PX: IR REPLC GASTRO/COLONIC TUBE PERCUT W/FLUORO: IMG2333

## 2021-08-17 LAB — CBC WITH DIFFERENTIAL/PLATELET
Abs Immature Granulocytes: 0.07 10*3/uL (ref 0.00–0.07)
Basophils Absolute: 0 10*3/uL (ref 0.0–0.1)
Basophils Relative: 0 %
Eosinophils Absolute: 0 10*3/uL (ref 0.0–0.5)
Eosinophils Relative: 0 %
HCT: 28.3 % — ABNORMAL LOW (ref 39.0–52.0)
Hemoglobin: 9.2 g/dL — ABNORMAL LOW (ref 13.0–17.0)
Immature Granulocytes: 1 %
Lymphocytes Relative: 17 %
Lymphs Abs: 2.1 10*3/uL (ref 0.7–4.0)
MCH: 29.8 pg (ref 26.0–34.0)
MCHC: 32.5 g/dL (ref 30.0–36.0)
MCV: 91.6 fL (ref 80.0–100.0)
Monocytes Absolute: 0.7 10*3/uL (ref 0.1–1.0)
Monocytes Relative: 5 %
Neutro Abs: 9.7 10*3/uL — ABNORMAL HIGH (ref 1.7–7.7)
Neutrophils Relative %: 77 %
Platelets: 446 10*3/uL — ABNORMAL HIGH (ref 150–400)
RBC: 3.09 MIL/uL — ABNORMAL LOW (ref 4.22–5.81)
RDW: 14.6 % (ref 11.5–15.5)
WBC: 12.6 10*3/uL — ABNORMAL HIGH (ref 4.0–10.5)
nRBC: 0 % (ref 0.0–0.2)

## 2021-08-17 LAB — COMPREHENSIVE METABOLIC PANEL
ALT: 62 U/L — ABNORMAL HIGH (ref 0–44)
AST: 50 U/L — ABNORMAL HIGH (ref 15–41)
Albumin: 2.9 g/dL — ABNORMAL LOW (ref 3.5–5.0)
Alkaline Phosphatase: 180 U/L — ABNORMAL HIGH (ref 38–126)
Anion gap: 11 (ref 5–15)
BUN: 27 mg/dL — ABNORMAL HIGH (ref 6–20)
CO2: 25 mmol/L (ref 22–32)
Calcium: 9.6 mg/dL (ref 8.9–10.3)
Chloride: 95 mmol/L — ABNORMAL LOW (ref 98–111)
Creatinine, Ser: 0.86 mg/dL (ref 0.61–1.24)
GFR, Estimated: >60 mL/min (ref >60)
Glucose, Bld: 105 mg/dL — ABNORMAL HIGH (ref 70–99)
Potassium: 4.6 mmol/L (ref 3.5–5.1)
Sodium: 131 mmol/L — ABNORMAL LOW (ref 135–145)
Total Bilirubin: 1.3 mg/dL — ABNORMAL HIGH (ref 0.3–1.2)
Total Protein: 8.1 g/dL (ref 6.5–8.1)

## 2021-08-17 LAB — GLUCOSE, CAPILLARY
Glucose-Capillary: 105 mg/dL — ABNORMAL HIGH (ref 70–99)
Glucose-Capillary: 106 mg/dL — ABNORMAL HIGH (ref 70–99)
Glucose-Capillary: 111 mg/dL — ABNORMAL HIGH (ref 70–99)
Glucose-Capillary: 115 mg/dL — ABNORMAL HIGH (ref 70–99)
Glucose-Capillary: 118 mg/dL — ABNORMAL HIGH (ref 70–99)
Glucose-Capillary: 126 mg/dL — ABNORMAL HIGH (ref 70–99)
Glucose-Capillary: 131 mg/dL — ABNORMAL HIGH (ref 70–99)

## 2021-08-17 LAB — URINALYSIS, ROUTINE W REFLEX MICROSCOPIC
Bilirubin Urine: NEGATIVE
Glucose, UA: NEGATIVE mg/dL
Ketones, ur: NEGATIVE mg/dL
Nitrite: POSITIVE — AB
Protein, ur: 100 mg/dL — AB
Specific Gravity, Urine: 1.011 (ref 1.005–1.030)
WBC, UA: >50 WBC/hpf — ABNORMAL HIGH (ref 0–5)
pH: 6 (ref 5.0–8.0)

## 2021-08-17 LAB — AMMONIA: Ammonia: 25 umol/L (ref 9–35)

## 2021-08-17 IMAGING — DX DG ABDOMEN 2V
2 series · 2 of 2 positions shown · non-contrast
Comparison: [DATE]

CLINICAL DATA: 60-year-old male with a history of fever

EXAM:
ABDOMEN - 2 VIEW

[abdomen erect]
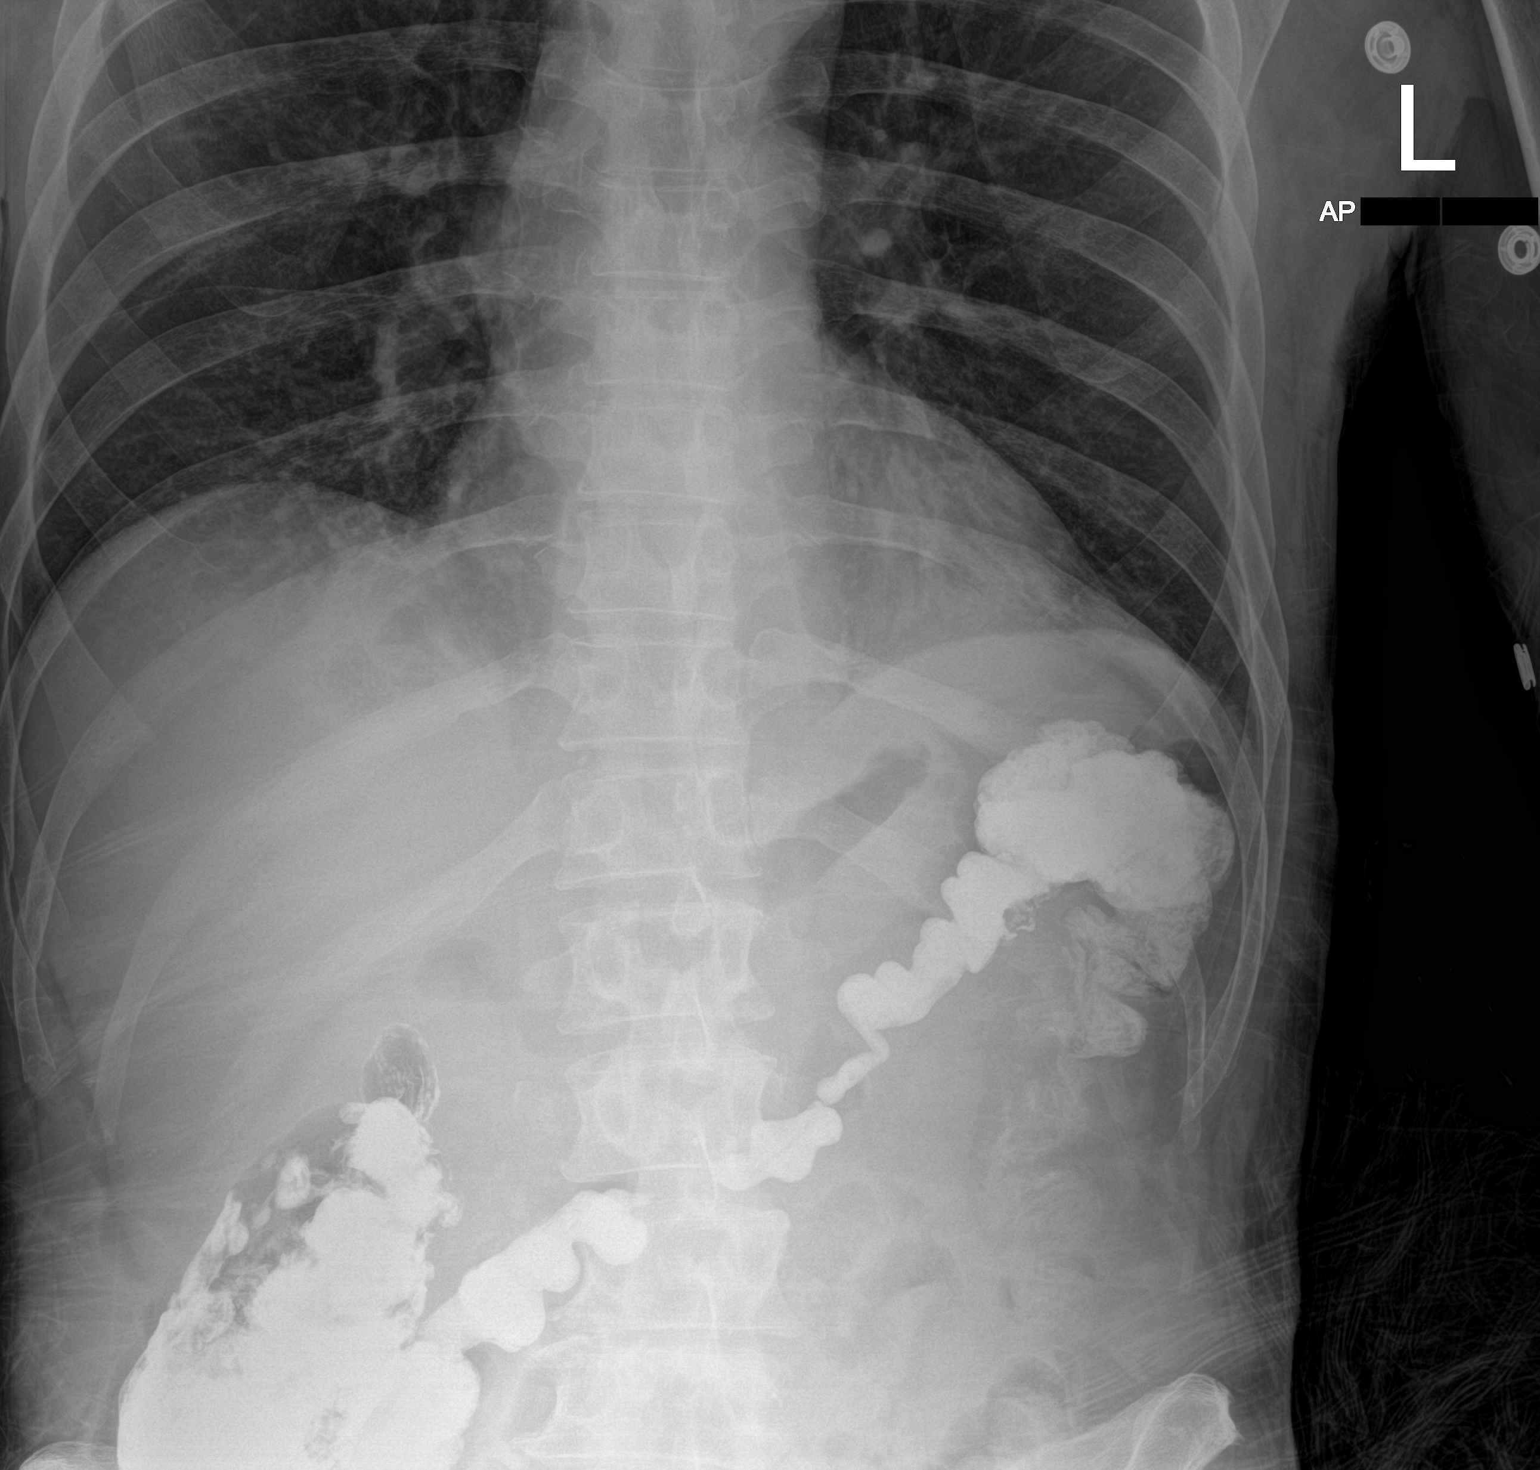

[abdomen supine]
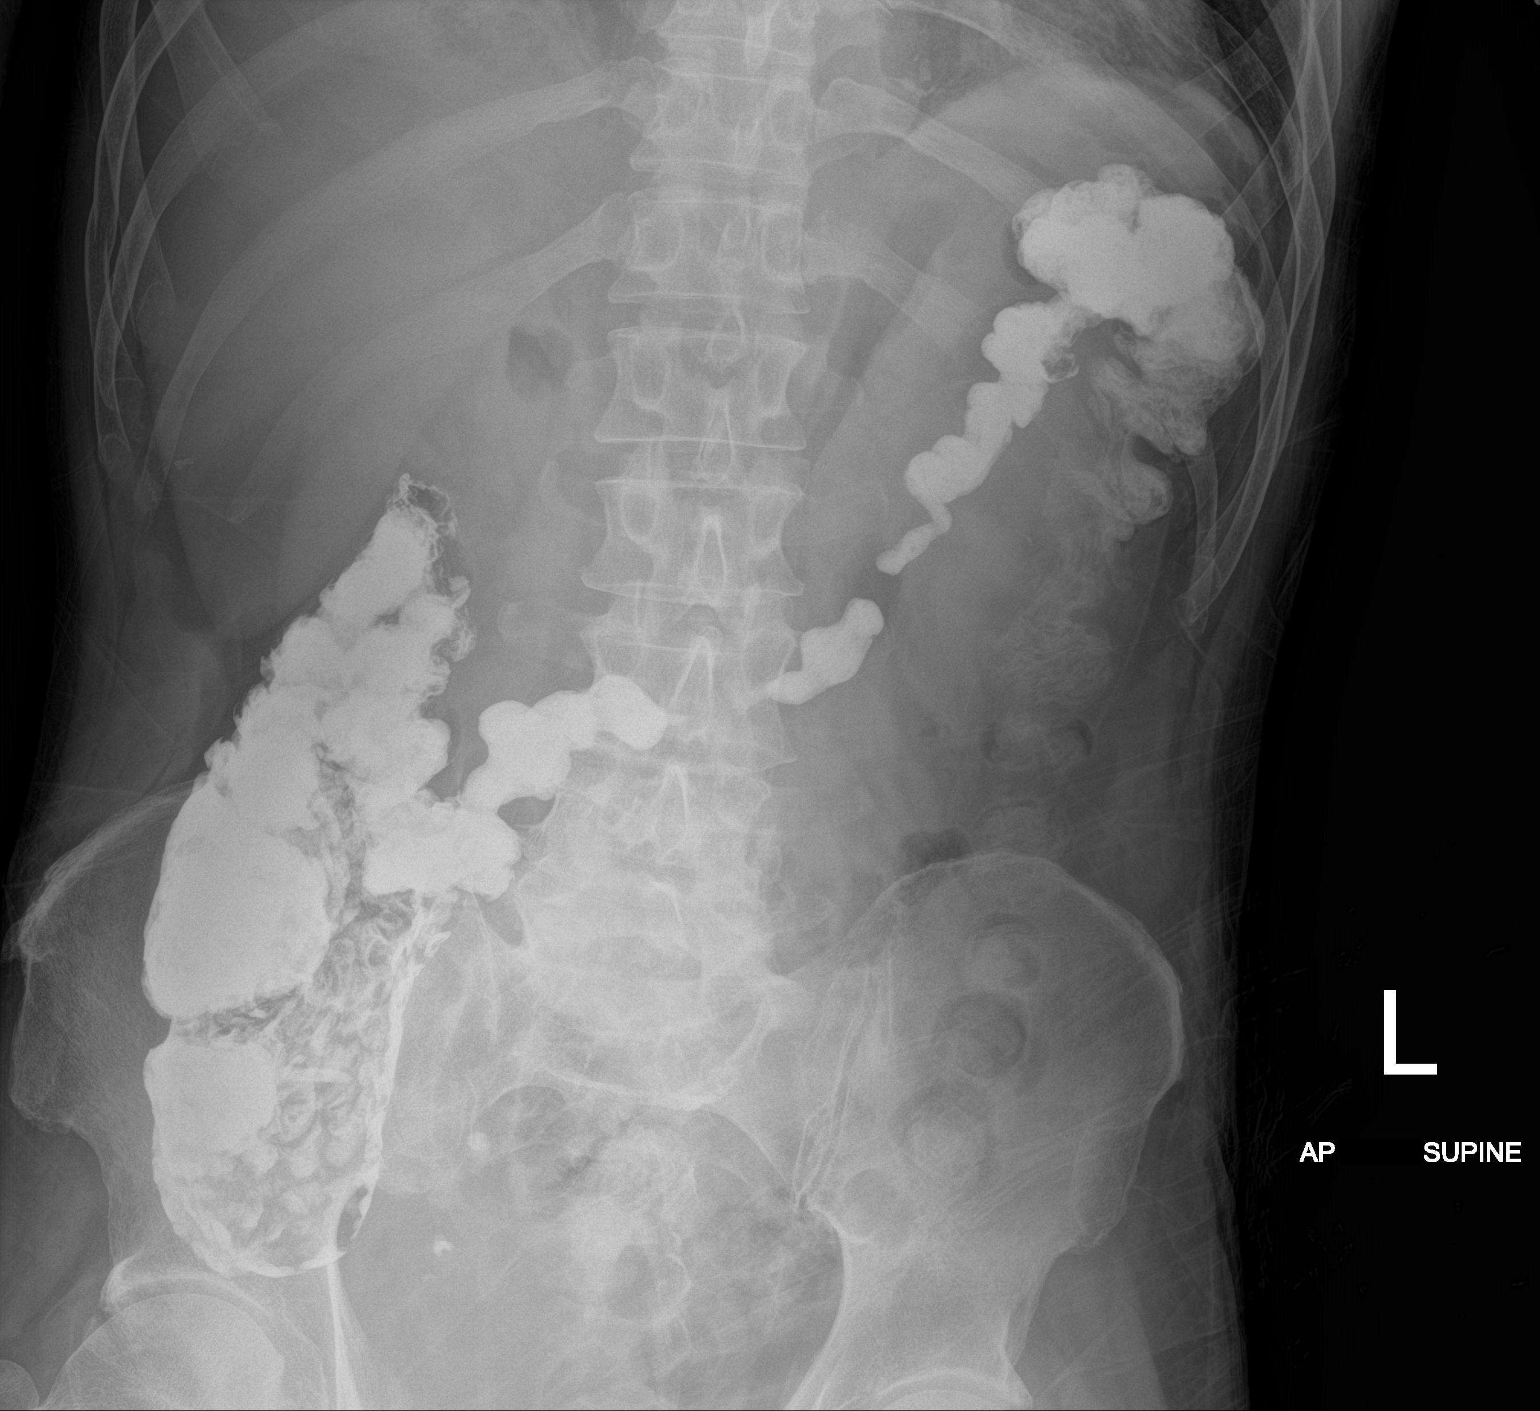

[2 of 2 positions shown; findings below may reference images not displayed]

FINDINGS: Retained barium within the colon.

Gas within stomach small bowel and colon.  No abnormal distension.

Interval removal of the enteric tube.

No radiopaque foreign body.

No displaced fracture
IMPRESSION: Retained contrast within the colon, otherwise negative plain film
abdomen

## 2021-08-17 IMAGING — XA IR REPLACE G-TUBE/COLONIC TUBE
3 series · 14 of 15 positions shown · non-contrast
Comparison: Image guided gastrostomy tube placement-[DATE]

INDICATION: Inadvertent removal of gastrostomy tube. Please perform fluoroscopic
guided gastrostomy tube replacement.

EXAM:
FLUOROSCOPIC GUIDED REPLACEMENT OF GASTROSTOMY TUBE

[Series 1: fl angio · 3 acquisitions, 6 frames shown (1 of 3)]
[im 1/3]
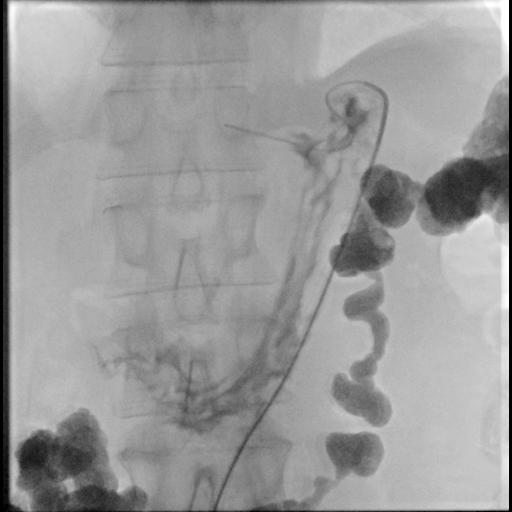
[im 1/3]
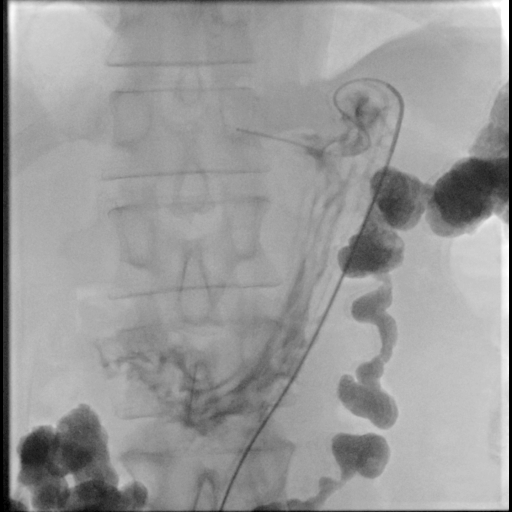
[im 1/3]
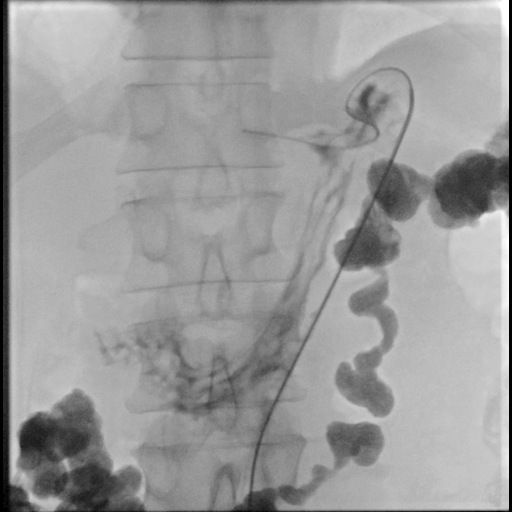
[im 1/3]
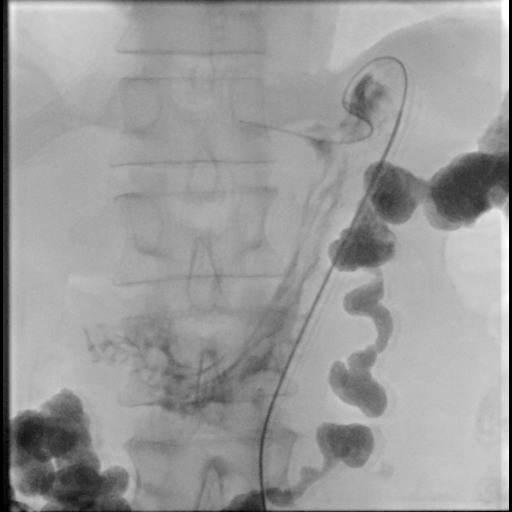
[im 2/3]
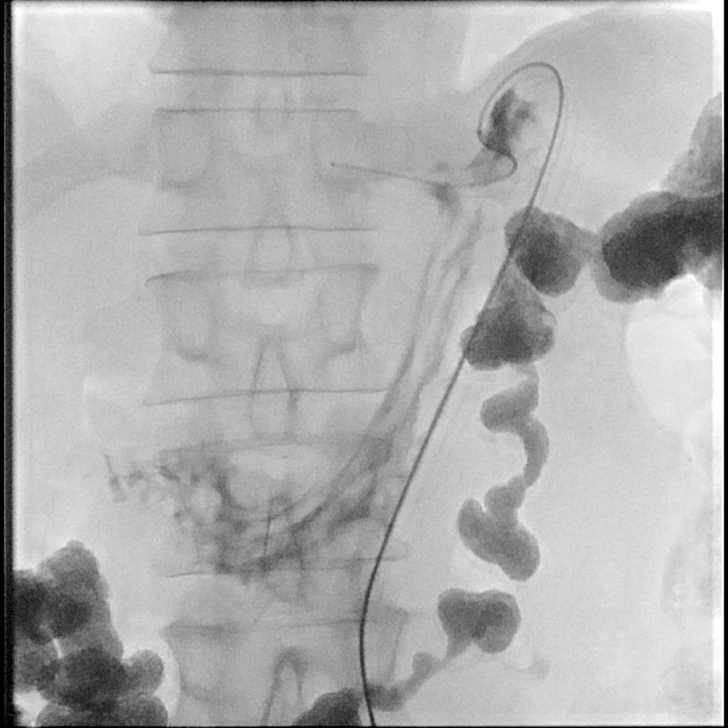
[im 3/3]
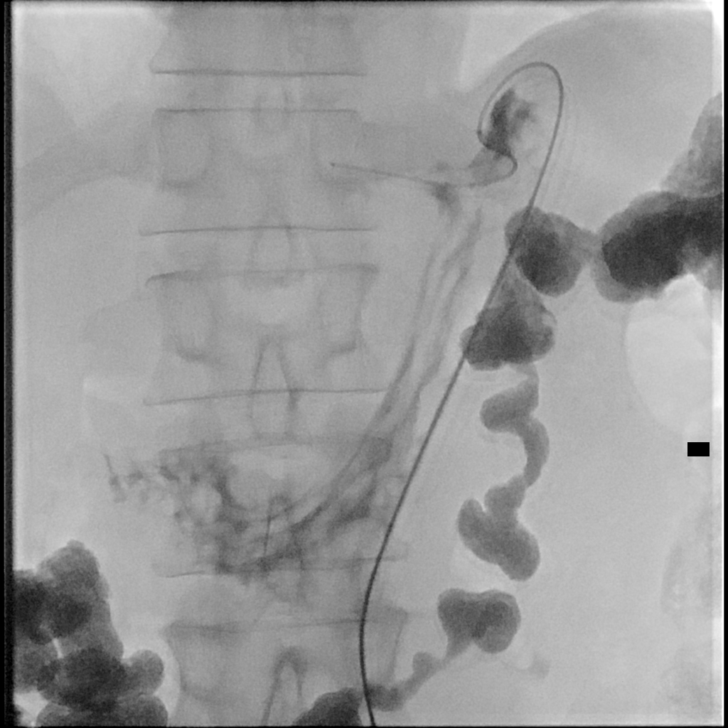

[Series 2: fl angio · 1 of 2 slices shown (2 of 3)]
[im 1/2]
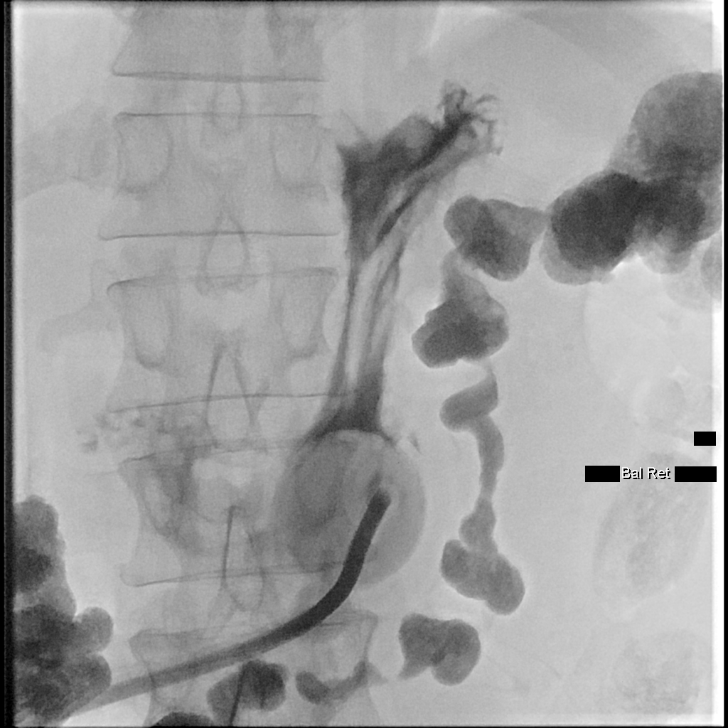

[Series 3: fl angio · 4 acquisitions, 7 frames shown (3 of 3)]
[im 1/4]
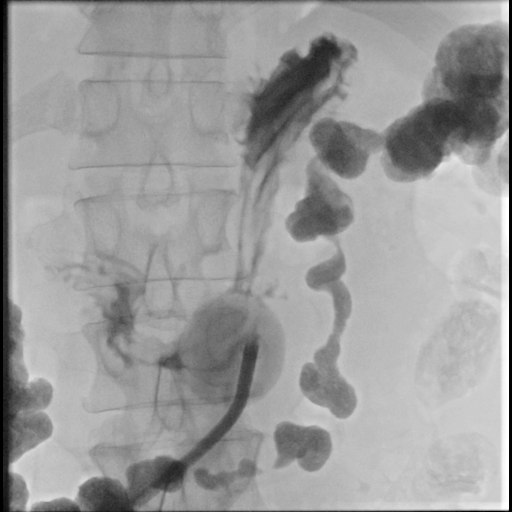
[im 1/4]
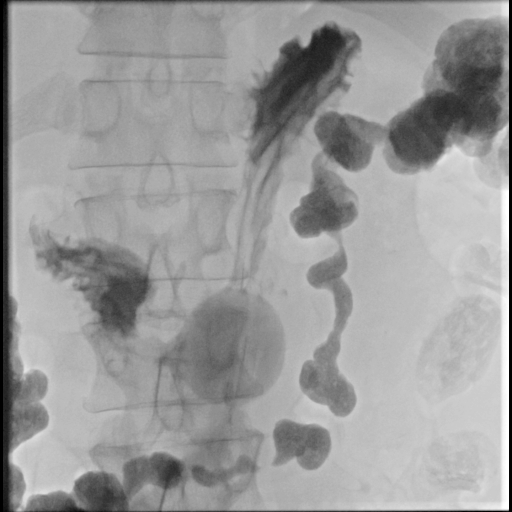
[im 1/4]
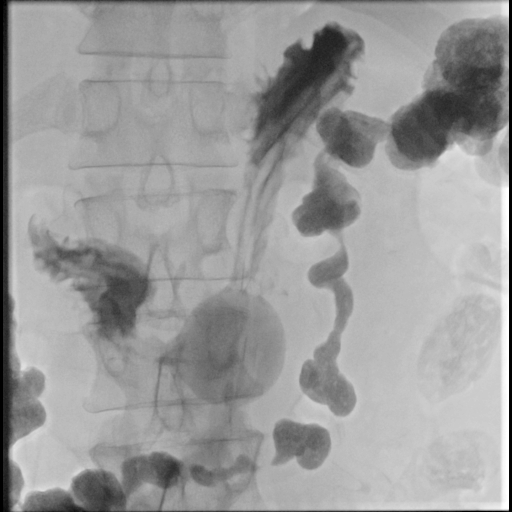
[im 1/4]
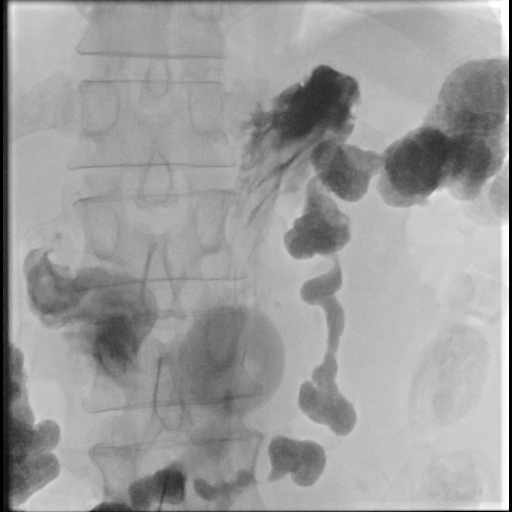
[im 2/4]
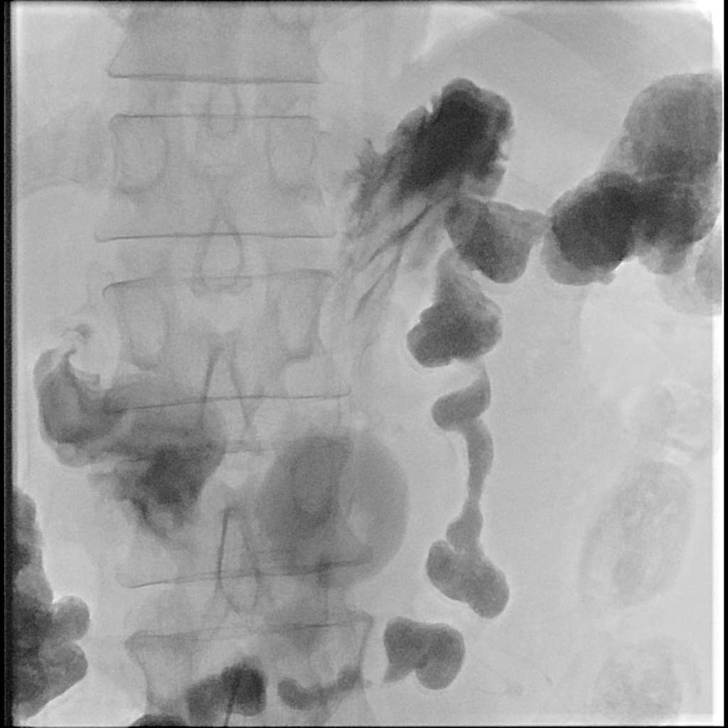
[im 3/4]
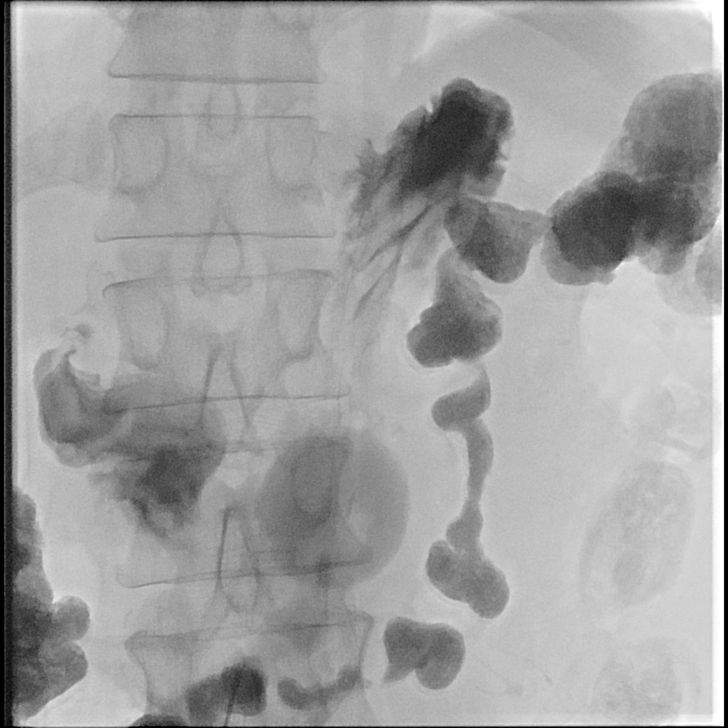
[im 4/4]
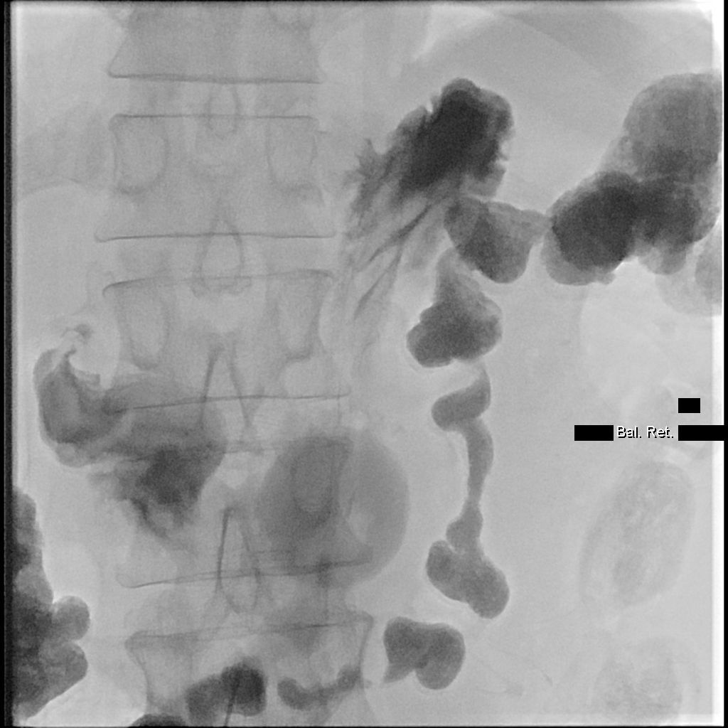

[14 of 15 positions shown; findings below may reference images not displayed]

MEDICATIONS:
None.

CONTRAST:  10mL OMNIPAQUE IOHEXOL 300 MG/ML SOLN - administered into
the gastric lumen

FLUOROSCOPY TIME:  1 minute, 18 seconds (20.5 mGy)

COMPLICATIONS:
None immediate.

PROCEDURE:
A timeout was performed prior to the initiation of the procedure.

The upper abdomen and site of the previous gastrostomy tube was
prepped and draped in the usual sterile fashion, and a sterile drape
was applied covering the operative field. Maximum barrier sterile
technique with sterile gowns and gloves were used for the procedure.
A timeout was performed prior to the initiation of the procedure.

The site of the previous gastrostomy tube was cannulated with an
Amplatz wire which was advanced through the subcutaneous track to
the level of the gastric lumen. Contrast injection confirmed
appropriate positioning

Next, over a short Amplatz wire, the Kumpe catheter was exchanged
for a new 24 French balloon retention gastrostomy tube. The balloon
was inflated with approximately 20 cc saline and dilute contrast and
pulled against the anterior inner lumen of the stomach and the
external disc was cinched.

Contrast was injected and a post procedural spot fluoroscopic image
was obtained confirming appropriate positioning and functionality of
the new gastrostomy tube. A dressing was applied. The patient
tolerated the procedure well without immediate postprocedural
complication.
IMPRESSION: Successful fluoroscopic guided replacement of a new 24-French
balloon retention gastrostomy tube. The gastrostomy tube is ready
for immediate use.

## 2021-08-17 IMAGING — DX DG CHEST 1V PORT
1 series · 1 of 1 positions shown · non-contrast
Comparison: [DATE]

CLINICAL DATA: 60-year-old male with history of fever

EXAM:
PORTABLE CHEST 1 VIEW

[chest ap]
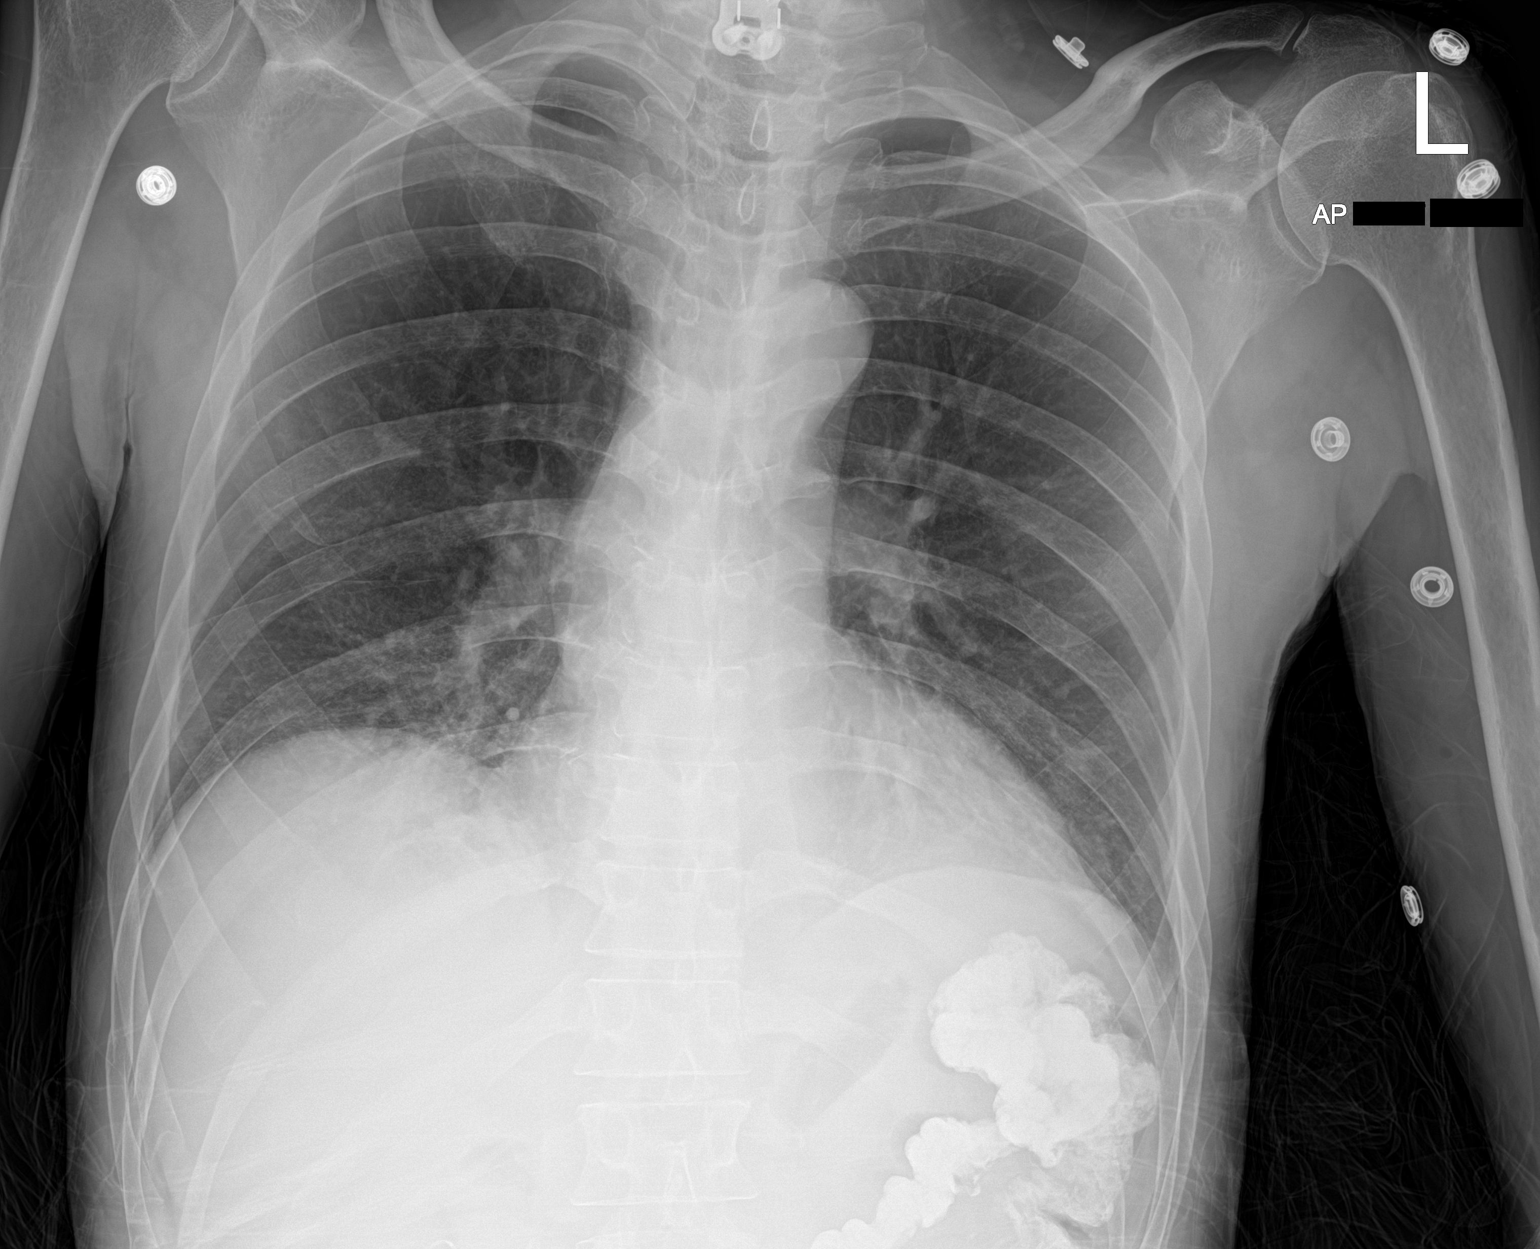

[1 of 1 positions shown; findings below may reference images not displayed]

FINDINGS: Cardiomediastinal silhouette unchanged in size and contour. No
evidence of central vascular congestion. No interlobular septal
thickening.

Interval removal of enteric tube and endotracheal tube.

Improved aeration of the lungs compared to the prior.

Low lung volumes with mild reticulonodular opacities at the right
lung base.

No pneumothorax or pleural effusion.

No acute displaced fracture. Degenerative changes of the spine.

Surgical changes of the cervical region
IMPRESSION: Interval removal of endotracheal tube and the enteric tube

Low lung volumes with vague reticulonodular opacities at the right
lung base, either atelectasis or potentially pneumonia

## 2021-08-17 MED ORDER — SODIUM CHLORIDE 0.9 % IV SOLN: INTRAVENOUS | Status: DC

## 2021-08-17 MED ORDER — ENOXAPARIN SODIUM 60 MG/0.6ML IJ SOSY
50.0000 mg | PREFILLED_SYRINGE | Freq: Two times a day (BID) | INTRAMUSCULAR | Status: DC
  Administered 2021-08-17 – 2021-08-21 (×8): 50 mg via SUBCUTANEOUS
  Filled 2021-08-17 (×8): qty 0.6

## 2021-08-17 MED ORDER — ACETAMINOPHEN 650 MG RE SUPP
RECTAL | Status: AC
  Filled 2021-08-17: qty 1

## 2021-08-17 MED ORDER — OSMOLITE 1.5 CAL PO LIQD
1000.0000 mL | ORAL | Status: DC
  Administered 2021-08-17 – 2021-08-23 (×5): 1000 mL
  Filled 2021-08-17 (×4): qty 1000

## 2021-08-17 MED ORDER — SODIUM CHLORIDE 0.9 % IV SOLN
1.5000 g | Freq: Three times a day (TID) | INTRAVENOUS | Status: DC
  Administered 2021-08-17 (×2): 1.5 g via INTRAVENOUS
  Filled 2021-08-17 (×4): qty 4

## 2021-08-17 MED ORDER — IOHEXOL 300 MG/ML  SOLN
100.0000 mL | Freq: Once | INTRAMUSCULAR | Status: AC | PRN
  Administered 2021-08-17: 10 mL

## 2021-08-17 MED ORDER — ACETAMINOPHEN 650 MG RE SUPP
325.0000 mg | RECTAL | Status: DC | PRN
  Filled 2021-08-17: qty 1

## 2021-08-17 MED ORDER — PROSOURCE TF PO LIQD
45.0000 mL | Freq: Every day | ORAL | Status: DC
  Administered 2021-08-18 – 2021-08-23 (×6): 45 mL
  Filled 2021-08-17 (×6): qty 45

## 2021-08-17 MED ORDER — LIDOCAINE VISCOUS HCL 2 % MT SOLN
OROMUCOSAL | Status: AC
  Administered 2021-08-17: 10 mL
  Filled 2021-08-17: qty 15

## 2021-08-17 MED ORDER — LORAZEPAM 2 MG/ML IJ SOLN
1.0000 mg | Freq: Once | INTRAMUSCULAR | Status: AC
  Administered 2021-08-17: 1 mg via INTRAVENOUS
  Filled 2021-08-17: qty 1

## 2021-08-17 MED ORDER — QUETIAPINE FUMARATE 25 MG PO TABS
12.5000 mg | ORAL_TABLET | Freq: Two times a day (BID) | ORAL | Status: AC
  Administered 2021-08-17 – 2021-08-21 (×9): 12.5 mg via ORAL
  Filled 2021-08-17 (×9): qty 1

## 2021-08-17 MED ORDER — ENOXAPARIN SODIUM 60 MG/0.6ML IJ SOSY
50.0000 mg | PREFILLED_SYRINGE | INTRAMUSCULAR | Status: AC
  Administered 2021-08-17: 50 mg via SUBCUTANEOUS
  Filled 2021-08-17: qty 0.6

## 2021-08-17 NOTE — Progress Notes (Signed)
Patient noted to be calling out, attempting to get out of bed, atempting to remove PIV, and removing external catheter x3. Music played; television's channel changed to patient's liking, warm blanket provided, fluids/food offered. Continued to be restless, repositions self almost every second, delirious. Haldol given 1mg  via IV. No relief. C/o pain, dilaudid given IV 1mg . Patient continued to be restless as above, MD notified along with relaying VS; came at bedside, orders placed. ?

## 2021-08-17 NOTE — Progress Notes (Signed)
Nutrition Follow-up ? ?DOCUMENTATION CODES:  ?Severe malnutrition in context of social or environmental circumstances, Underweight ? ?INTERVENTION:  ?Recommend replacement of PEG for continuation of tube feeding. Once placed and ready for use, recommend: ?Osmolite 1.5 at 20 ml/hr, advance 98m/hr Q4H until goal rate of 629mhr is reached ?ProSource TF 45 mL daily ?15058mree water Q4H once IVF are discontinued  ? ?This provides 2200 kcals, 101 grams of protein and 1097 mL of free water (1997m1mtal free water with flushes) ? ?NUTRITION DIAGNOSIS:  ?Severe Malnutrition related to social / environmental circumstances as evidenced by severe muscle depletion, severe fat depletion. -- ongoing  ? ?GOAL:  ?Patient will meet greater than or equal to 90% of their needs -- met with TF at goal ? ?MONITOR:  ?TF tolerance ? ?REASON FOR ASSESSMENT:  ?Consult ?Assessment of nutrition requirement/status, Enteral/tube feeding initiation and management ? ?ASSESSMENT:  ?Pt with hx EtOH abuse, tobacco use, and spinal stenosis initially presented 2/3 for planned multilevel anterior cervical decompression and fusion surgery after experiencing progressive bilateral upper and lower extremity weakness and spasticity due to critical multilevel cervical spinal stenosis with spinal cord compression and signal change. ? ?02/03 - s/p anterior cervical discectomy with interbody fusion  ?02/05 - pt initially complained of worsening swallowing function ?02/06 - MBS, NPO per SLP ?02/07 - Code Stroke activated (negative) but transferred to ICU, s/p re-exploration anterior cervical fusion with evacuation of epidural hematoma ?02/08 - Cortrak tube placed (tip gastric), tube feeds initiated ?02/10 - MBS, diet advanced to dysphagia 2 with nectar-thick liquids, Cortrak removed ?02/11 - NPO ?02/13 - diet advanced to dysphagia 2 with nectar-thick liquids ?02/16 - diet advanced to dysphagia 2 with thin liquids ?02/19- Intubated after desaturation, possibly  from PO intake ?02/20 - s/p cortrak tube; post pyloric  ?02/22 - TF off, golytely given ?02/23 - restarted and advancing TF ?02/26 - Extubated ?03/02 - failed MBS ?03/06 - PEG placed ?03/23 - diet advanced to dysphagia 2 with nectar thick liquids s/p MBSS ? ?Discussed pt with RN. Pt pulled PEG last night and is to be seen by IR for replacement. RN reports pt is having increased agitation and discomfort and has been experiencing some delirium and is unable to follow commands. Pt unable to participate with SLP today. RD was consulted to determine if pt is taking in enough PO to meet his needs; however, pt is now NPO. Recommend pursue replacement of PEG and continuation of TF until pt consistently meeting >75% estimated needs PO. Note concern for PNA; abx initiated and CXR pending.  ? ?Pt with continued weight loss. Will update TF recommendation with increased rate in order to provide additional calories/protein.  ? ?UOP: 1100ml35m hours ?I/O: -7405ml 2me admit ? ?Admit wt 61.7 kg ?Current wt 47.8 kg ? ?Labs: Na 131(L) ?Elevated LFTs ?Total bilirubin 1.3  ?Hgb 9.2 ?CBGs 105-131 x 24 hours ?Medications: MVI, thiamine, folvite, IV abx ? ?Diet Order:   ?Diet Order   ? ?       ?  Diet NPO time specified  Diet effective now       ?  ? ?  ?  ? ?  ? ?EDUCATION NEEDS:  ?Education needs have been addressed ? ?Skin:  Skin Assessment: Skin Integrity Issues: ?Skin Integrity Issues:: Other (Comment) ?Incisions: closed neck ?Other: MASD buttocks ? ?Last BM:  3/20 ? ?Height:  ?Ht Readings from Last 1 Encounters:  ?07/15/21 5' 10"  (1.778 m)  ? ?Weight:  ?Wt Readings from Last  1 Encounters:  ?08/17/21 47.8 kg  ? ?Ideal Body Weight:  78.2 kg ? ?BMI:  Body mass index is 15.12 kg/m?. ? ?Estimated Nutritional Needs:  ?Kcal:  2000-2200 ?Protein:  100-120 grams ?Fluid:  >2L/day ? ? ? ?Theone Stanley., MS, RD, LDN (she/her/hers) ?RD pager number and weekend/on-call pager number located in [place]. ? ? ?

## 2021-08-17 NOTE — Progress Notes (Addendum)
Patient reported that the foley placed to keep peg tube patent "came out". Unable to reinsert since it had already closed. Site cleaned and covered with 4x4 gauze, secured with tape C. Hall DO notified; also ordered to administer meds orally, if tolerated. Patient took all oral meds except prosource.  ? ? ?

## 2021-08-17 NOTE — Progress Notes (Addendum)
? ?Providing Compassionate, Quality Care - Together ? ? ?Subjective: ?Patient unable to answer questions. He is thrashing around the bed and in bilateral wrist restraints. Per nursing staff, patient pulled out his PEG tube yesterday. ? ?Objective: ?Vital signs in last 24 hours: ?Temp:  [98.9 ?F (37.2 ?C)-101.4 ?F (38.6 ?C)] 101.4 ?F (38.6 ?C) (03/24 1229) ?Pulse Rate:  [98-166] 166 (03/24 1229) ?Resp:  [16-20] 20 (03/24 1229) ?BP: (113-151)/(75-108) 141/90 (03/24 1229) ?SpO2:  [98 %-100 %] 100 % (03/24 1229) ?Weight:  [47.8 kg] 47.8 kg (03/24 0500) ? ?Intake/Output from previous day: ?03/23 0701 - 03/24 0700 ?In: 26 [P.O.:240; NG/GT:550] ?Out: 1100 [Urine:1100] ?Intake/Output this shift: ?No intake/output data recorded. ? ?Alert, agitated ?Unable to answer orientation questions appropriately aside from name ?MAE, LLE >RLE ?Decreased fine motor BUE ?Incision is healing well ? ?Lab Results: ?Recent Labs  ?  08/15/21 ?[id number] 08/17/21 ?1007  ?WBC 7.8 12.6*  ?HGB 9.7* 9.2*  ?HCT 30.4* 28.3*  ?PLT 533* 446*  ? ?BMET ?Recent Labs  ?  08/15/21 ?0254 08/17/21 ?1007  ?NA 131* 131*  ?K 4.3 4.6  ?CL 95* 95*  ?CO2 28 25  ?GLUCOSE 100* 105*  ?BUN 30* 27*  ?CREATININE 0.72 0.86  ?CALCIUM 10.1 9.6  ? ? ?Studies/Results: ?DG CHEST PORT 1 VIEW ? ?Result Date: 08/17/2021 ?CLINICAL DATA:  61 year old male with history of fever EXAM: PORTABLE CHEST 1 VIEW COMPARISON:  07/19/2021 FINDINGS: Cardiomediastinal silhouette unchanged in size and contour. No evidence of central vascular congestion. No interlobular septal thickening. Interval removal of enteric tube and endotracheal tube. Improved aeration of the lungs compared to the prior. Low lung volumes with mild reticulonodular opacities at the right lung base. No pneumothorax or pleural effusion. No acute displaced fracture. Degenerative changes of the spine. Surgical changes of the cervical region IMPRESSION: Interval removal of endotracheal tube and the enteric tube Low lung volumes with  vague reticulonodular opacities at the right lung base, either atelectasis or potentially pneumonia Electronically Signed   By: Corrie Mckusick D.O.   On: 08/17/2021 10:49  ? ?DG Abd 2 Views ? ?Result Date: 08/17/2021 ?CLINICAL DATA:  61 year old male with a history of fever EXAM: ABDOMEN - 2 VIEW COMPARISON:  07/18/2021 FINDINGS: Retained barium within the colon. Gas within stomach small bowel and colon.  No abnormal distension. Interval removal of the enteric tube. No radiopaque foreign body. No displaced fracture IMPRESSION: Retained contrast within the colon, otherwise negative plain film abdomen Electronically Signed   By: Corrie Mckusick D.O.   On: 08/17/2021 10:51  ? ?DG Swallowing Func-Speech Pathology ? ?Result Date: 08/16/2021 ?Table formatting from the original result was not included. Objective Swallowing Evaluation: Type of Study: MBS-Modified Barium Swallow Study  Patient Details Name: Brian [name] MRN: [id number] Date of Birth: 16-Jan-1961 Today's Date: 08/16/2021 Time: SLP Start Time (ACUTE ONLY): 1043 -SLP Stop Time (ACUTE ONLY): 1100 SLP Time Calculation (min) (ACUTE ONLY): 17 min Past Medical History: No past medical history on file. Past Surgical History: Past Surgical History: Procedure Laterality Date  ANTERIOR CERVICAL DECOMPRESSION/DISCECTOMY FUSION 4 LEVELS N/A 06/29/2021  Procedure: Anterior Cervical Discectomy Fusion - Cervical Three-Cervical Four - Cervical Four- Cervical Five - Cervical Five- Cervical Six - Cervical Six- Cervical Seven;  Surgeon: Earnie Larsson, MD;  Location: [place];  Service: Neurosurgery;  Laterality: N/A;  HEMATOMA EVACUATION N/A 07/03/2021  Procedure: EXPLORATION OF NECK AND REMOVE BLOOD CLOT;  Surgeon: Earnie Larsson, MD;  Location: [place];  Service: Neurosurgery;  Laterality: N/A;  IR GASTROSTOMY  TUBE MOD SED  07/30/2021 HPI: Pt is a 61 y/o admitted 06/29/21 following C3-7 ACDF after progressive cervical myelopathy symptoms with subsequent fall and severe incomplete SCI. Significant  prevertebral swelling per imaging. Pt developed some difficulty swallowing on 2/5 and SLP consulted. MBS 2/6 revealed severe edema s/p ACDF, oral holding, a pharyngeal delay, inconsistent hyolaryngeal elevation, incomplete epiglottic inversion due to edema. Aspiration noted accross consistencies and and NPO status was recommended. Repeat MBS 2/10 showed significantly reduced pharyngeal edema and a dysphagia 2 diet with nectar thick liquids was initiated. Pt transitioned to thin liquids 2/16 and susbequently returned to nectar thick 2/17 due to concern for aspiration. Pt developed pna, possible aspiration, requiring intubation 2/19-26. Cortrak placed 2/20. PEG placed in IR on 3/6. No significant PMH. This is pt's 3rd MBS  Subjective: alert, cooperative, asking if his son is out in the hall  Recommendations for follow up therapy are one component of a multi-disciplinary discharge planning process, led by the attending physician.  Recommendations may be updated based on patient status, additional functional criteria and insurance authorization. Assessment / Plan / Recommendation   08/16/2021  12:17 PM Clinical Impressions Clinical Impression Pt's swallow ability has improved from previous MBS 07/26/21 and he was alert and lucid. His oropharyngeal dysphagia is mild with laryngeal penetration and no aspiration this study. Lingual propulsion with solids was effortful with mild-moderate delays and there was mild lingual resdidue with all consistencies but puree. Pharyngeal phase was marked by incomplete laryngeal closure with penetration of thin (PAS 3, 5) without sensation to vocal cords. Strategies including chin tuck then left head turn were not were ineffective and increase volume of material and deeper into vestibule on vocal cords. Nectar thick was penetrated x 1 while using a straw (PAS 3) and cup sips did not intrude airway. Minimal vallecular residue and mild-mod pyriform sinus resudue due to intermittently reduced  pharyngeal contraction and shortening. Second spontaneous swallows inconsistently effective to reduce residue. Mild stasis observed when esophagus scanned and question of minimal retrograde movement. Recommend Dys 2, nectar thick liquids, no straws, intermittent throat clear, pills via PEG and pt will need full supervision and assist with feeding. SLP Visit Diagnosis Dysphagia, oropharyngeal phase (R13.12) Impact on safety and function Mild aspiration risk;Moderate aspiration risk     08/16/2021  12:17 PM Treatment Recommendations Treatment Recommendations Therapy as outlined in treatment plan below     08/16/2021  12:17 PM Prognosis Prognosis for Safe Diet Advancement Good Barriers to Reach Goals Severity of deficits   08/16/2021  12:17 PM Diet Recommendations SLP Diet Recommendations Dysphagia 2 (Fine chop) solids;Nectar thick liquid Liquid Administration via Cup;No straw Medication Administration Via alternative means Compensations Slow rate;Small sips/bites;Clear throat intermittently Postural Changes Seated upright at 90 degrees     08/16/2021  12:17 PM Other Recommendations Oral Care Recommendations Oral care BID Follow Up Recommendations Skilled nursing-short term rehab (<3 hours/day) Functional Status Assessment Patient has had a recent decline in their functional status and demonstrates the ability to make significant improvements in function in a reasonable and predictable amount of time.   08/16/2021  12:17 PM Frequency and Duration  Speech Therapy Frequency (ACUTE ONLY) min 2x/week Treatment Duration 2 weeks     08/16/2021  12:17 PM Oral Phase Oral Phase Impaired Oral - Nectar Teaspoon NT Oral - Nectar Cup Lingual/palatal residue Oral - Nectar Straw Lingual/palatal residue Oral - Thin Teaspoon WFL Oral - Thin Cup Lingual/palatal residue Oral - Thin Straw NT Oral - Puree Lingual/palatal residue Oral -  Mech Soft NT Oral - Regular Delayed oral transit    08/16/2021  12:17 PM Pharyngeal Phase Pharyngeal Phase  Impaired Pharyngeal- Nectar Teaspoon NT Pharyngeal- Nectar Cup Pharyngeal residue - valleculae Pharyngeal- Nectar Straw Penetration/Aspiration during swallow Pharyngeal Material enters airway, remains ABOVE

## 2021-08-17 NOTE — Progress Notes (Signed)
SLP Cancellation Note ? ?Patient Details ?Name: Brian [name] ?MRN: [id number] ?DOB: 1960/06/15 ? ? ?Cancelled treatment:        Pt had some delirium last night and pulled PEG tube out, trying to get out of bed. Pt NPO and MD ordered repeat CXR due to concern of pna. Will continue to follow.  ? ?RN stated he did not eat much after MBS yesterday, several bites, but had liquid that was appropriately thickened to nectar consistency.  ? ? ?Houston Siren ?08/17/2021, 10:15 AM ?

## 2021-08-17 NOTE — Progress Notes (Signed)
Patient is having increasing agitation and discomfort. Pt is oriented but experiencing some delirium and is unable to follow commands. RN gave PRN meds. Update MD to follow. Patient is resting. Airway is patent and chest is rising and falling equally and unlabored.  ?

## 2021-08-17 NOTE — Progress Notes (Signed)
Patient attempts to get out of bed, throws legs to side rails and pulls self out of the bed. Attempts to remove PIV; unable to redirect. Received order from MD to apply bilateral soft wrist restraints.  ?Patient educated, no family member at bedside. Skin and circulation checked. Bilateral soft wrist restraints applied.  ?

## 2021-08-17 NOTE — Progress Notes (Signed)
Pharmacy Antibiotic Note ? ?Brian [name] is a 61 y.o. male admitted on 06/29/2021 with pneumonia.  Pharmacy has been consulted for Lovenox/unasyn dosing. ? ?Pt with recent PE and is on apixaban. She is not NPO so we will bridge with SQ lovenox. Unasyn also ordered for PNA. ? ?CrCl 62 ml/min ?Wt 48kg ? ?Plan: ?Unasyn 1.5g IV q8 ?Lovenox 50mg  SQ BID ?F/u resume apixaba ? ?Height: 5\' 10"  (177.8 cm) ?Weight: 47.8 kg (105 lb 6.1 oz) ?IBW/kg (Calculated) : 73 ? ?Temp (24hrs), Avg:99.8 ?F (37.7 ?C), Min:98.9 ?F (37.2 ?C), Max:101.4 ?F (38.6 ?C) ? ?Recent Labs  ?Lab 08/13/21 ?0450 08/15/21 ?0254 08/17/21 ?1007  ?WBC 10.2 7.8 12.6*  ?CREATININE 0.71 0.72 0.86  ?  ?Estimated Creatinine Clearance: 61.8 mL/min (by C-G formula based on SCr of 0.86 mg/dL).   ? ?No Known Allergies ? ?Antimicrobials this admission: ?3/24 unasyn>> ? ?Dose adjustments this admission: ? ? ?Microbiology results: ?3/24 blood>> ?3/24 urine>> ? ? ?4/24, PharmD, BCIDP, AAHIVP, CPP ?Infectious Disease Pharmacist ?08/17/2021 1:33 PM ? ? ?

## 2021-08-17 NOTE — Plan of Care (Signed)
?  Problem: Education: ?Goal: Ability to verbalize activity precautions or restrictions will improve ?Outcome: Not Progressing ?Goal: Knowledge of the prescribed therapeutic regimen will improve ?Outcome: Not Progressing ?Goal: Understanding of discharge needs will improve ?Outcome: Not Progressing ?  ?Problem: Activity: ?Goal: Ability to avoid complications of mobility impairment will improve ?Outcome: Not Progressing ?Goal: Ability to tolerate increased activity will improve ?Outcome: Not Progressing ?Goal: Will remain free from falls ?Outcome: Not Progressing ?  ?Problem: Bowel/Gastric: ?Goal: Gastrointestinal status for postoperative course will improve ?Outcome: Not Progressing ?  ?Problem: Clinical Measurements: ?Goal: Ability to maintain clinical measurements within normal limits will improve ?Outcome: Not Progressing ?Goal: Postoperative complications will be avoided or minimized ?Outcome: Not Progressing ?Goal: Diagnostic test results will improve ?Outcome: Not Progressing ?  ?Problem: Pain Management: ?Goal: Pain level will decrease ?Outcome: Not Progressing ?  ?Problem: Skin Integrity: ?Goal: Will show signs of wound healing ?Outcome: Not Progressing ?  ?Problem: Health Behavior/Discharge Planning: ?Goal: Identification of resources available to assist in meeting health care needs will improve ?Outcome: Not Progressing ?  ?Problem: Bladder/Genitourinary: ?Goal: Urinary functional status for postoperative course will improve ?Outcome: Not Progressing ?  ?Problem: Safety: ?Goal: Ability to remain free from injury will improve ?Outcome: Not Progressing ?  ?Problem: Education: ?Goal: Knowledge of General Education information will improve ?Description: Including pain rating scale, medication(s)/side effects and non-pharmacologic comfort measures ?Outcome: Not Progressing ?  ?Problem: Health Behavior/Discharge Planning: ?Goal: Ability to manage health-related needs will improve ?Outcome: Not Progressing ?   ?Problem: Clinical Measurements: ?Goal: Ability to maintain clinical measurements within normal limits will improve ?Outcome: Not Progressing ?Goal: Will remain free from infection ?Outcome: Not Progressing ?Goal: Diagnostic test results will improve ?Outcome: Not Progressing ?Goal: Respiratory complications will improve ?Outcome: Not Progressing ?Goal: Cardiovascular complication will be avoided ?Outcome: Not Progressing ?  ?Problem: Activity: ?Goal: Risk for activity intolerance will decrease ?Outcome: Not Progressing ?  ?Problem: Nutrition: ?Goal: Adequate nutrition will be maintained ?Outcome: Not Progressing ?  ?Problem: Coping: ?Goal: Level of anxiety will decrease ?Outcome: Not Progressing ?  ?Problem: Elimination: ?Goal: Will not experience complications related to bowel motility ?Outcome: Not Progressing ?Goal: Will not experience complications related to urinary retention ?Outcome: Not Progressing ?  ?Problem: Pain Managment: ?Goal: General experience of comfort will improve ?Outcome: Not Progressing ?  ?Problem: Safety: ?Goal: Ability to remain free from injury will improve ?Outcome: Not Progressing ?  ?Problem: Skin Integrity: ?Goal: Risk for impaired skin integrity will decrease ?Outcome: Not Progressing ?  ?Problem: Safety: ?Goal: Non-violent Restraint(s) ?Outcome: Not Progressing ?  ?

## 2021-08-17 NOTE — Procedures (Signed)
Pre procedural Dx: Dysphagia, inadvertent removal of G-tube. ?Post procedural Dx: Same ? ?Successful fluoroscopic guided replacement of 24 Fr balloon retention gastrostomy tube.   ?The feeding tube is ready for immediate use. ? ?EBL: None ? ?Complications: None immediate. ? ?Ronny Bacon, MD ?Pager #: [phone] ?  ?

## 2021-08-17 NOTE — Progress Notes (Signed)
Transport came to unit to pick up patient. This RN was finishing a bladder scan ordered STAT by [employer].  Transport reported a delay.  Patient was ready to go with transport in around 1 minute. IR called after patient left with transport and I explained the situation. IR expressed irritation he was not down for them yet. ?

## 2021-08-17 NOTE — Progress Notes (Signed)
? ? ? [facility] ?                                                                            ? ? ?Brian [name], is a 60 y.o. male, DOB - 1961-05-01, [id number] ?Admit date - 06/29/2021    ?Outpatient Primary MD for the patient is Pcp, No ? ?LOS - 49  days ? ? ? ?Brief summary  ? 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  He was admitted on 2/3 and underwent ACDF of C3-4, C4-5, C5-5, and C6-7.  Post-operatively he was progressing slowly with residual weakness in both hands but improving lower extremity strength and function.  He developed some difficulty swallowing on 2/5 and SLP ordered and placed on thickened liquids.   Overnight 2/6, patient with increased difficulty swallowing and was made NPO but also noted to have dysarthria and difficulty moving extremities with numbness.  SLP evaluated and found to be moderate aspiration risk.   Also progressively tachycardic, tachypneic, and now febrile.  Code stroke activated and taken for CT/ CTA head and neck and was given decadron 10mg  once.  NIHSS 18.  CTH was negative for acute findings, and CTA head neck did not reveal any LVO but noted significant prevertebral soft tissue swelling with foci of gas present at the ventral epidural space at C4-5, small collection not excluded. On 2/7, patient with worsening confusion and concern for airway involvement, PCCM consulted and remained in the intensive care unit until he is transferred to the floor on 3/2 and PCCM requested transition to [facility] for medical assistance while patient remains under the neurosurgery service. ? ?Significant Hospital Events: ?2/3 ACDF C3-4, C4-5, C5-5, and C6-7  w/ Dr. Annette Stable ?2/6 SLP eval for difficulty swallowing ?2/7 Code stroke overnight, neg for LVO, CT showing soft tissue swelling.  PCCM consulted for concern of airway management and AMS.  Went for evacuation of hematoma  ?2/18 CT Abd/Pelvis: showing bilateral segmental Pes, started on heparin and showing worsening pneumonia, febrile to  103.  PCT rising, 34.6, restarted on abx ?2/19 possible aspiration w/ severe hypoxia; intubated; Switching from heparin to angiomax given subtherapeutic levels despite increase in rate. ?2/20 Echo shows normal LV systolic function. There was notation of a + McConnell's sign c/w a large pulmonary embolus which is consistent with the finding of bilateral segmental pulmonary emboli noted on CT 07/14/2021. ?2/21 rash appreciated on back; stopped cefepim/vanc switched to zosyn; required low dose levo with increase in fentanyl ?2/22 remains intubated; unable to extubate to do increase RR and thick secretions ?2/23 [name] infusion added ?2/24: weaning fentanyl.  ?2/26 extubated ?2/28 tachycardia persists ?3/2: Modified barium swallow, continues n.p.o. with core track in place, likely will need PEG tube. ?3/6: s/p IR G-tube placement ?3/23: patient pulled out G tube, became delirious possibly secondary to UTI.  ?3/24: replacement of the G tube and IV antibiotics started for UTI.  ? ? ? ?  Assessment & Plan  ? ? ?Assessment and Plan: ?* Cervical myelopathy ([place]); severe cervical spinal stenosis with cord compression s/p ACDF [id number] on 2/3, complicated by epidural hematoma s/p evacuation 2/7 ?Patient initially presented s/p recent fall with progressive upper and lower extremity weakness and tremors, He was found to have critical multilevel cervical spinal stenosis @ C3-3 6 with spinal cord compression and spinal cord signal change and underwent ACDF C3-4, C4-5, C5-5, and C6/7 by neurosurgery Dr. Trenton Gammon on 06/29/2021.  Postoperative leg complicated by epidural hematoma s/p evacuation on  2/7. ?RECOMMENDATIONS  as per Neuro surgery.  ?Therapy eval recommending SNF.  ?Toc on board. ? ?Acute respiratory failure with hypoxia ([place]) ?Likely Multifactorial with significant dysphagia and recurrent aspiration pneumonia events during initial hospitalization following ACDF surgery with postoperative complications of postoperative cervical hematoma.  Patient did require ventilatory support in the intensive care unit and was successfully extubated on 07/22/2021.  Patient completed extensive course of empiric antibiotics.   ?Weaned off oxygen and stable on RA.  ? ? ?Urinary tract infection leading to delirium and pulling out the G tube  ?Started him on IV antibiotics .  ?Blood cultures and urine cultures are pending.  ? ? ? ?Acute pulmonary embolism ([place]) ?CTA chest showed bilateral segmental PE.  LE [place] negative for DVT.   ?Echocardiogram does not show any right heart strain.  ?Transitioned to eliquis for anti coagulation.  ? ?Acute metabolic encephalopathy ?CT head, MRI brain, EEG, TSH, B12, ammonia unrevealing.   ?Etiology likely secondary to UTI.  ?Started him on IV antibiotics. Get urine cultures.  ?Blood cultures ordered and pending.  ?CXR showing possible infiltrate , ? Aspiration pneumonia.  ?Delirium precautions.  ? ?Severe sepsis due to recurrent aspiration pneumonia ?Likely recurrent issue during hospitalization, remains n.p.o. due to continued aspiration risk per SLP.  Completed extensive course of antibiotics while under ICU care. S/p IR gtube 3/6.and replaced on 3/24 ?Continue tube feeds  and aspiration precautions.  ? ? ?Sinus tachycardia ?Multifactorial including sepsis, dehydration, PE, agitation.   ?Controlled on metoprolol.  ? ? ?Elevated liver enzymes ?Etiology likely secondary to sepsis from aspiration pneumonia as above.   ?Improving.  ?Acute hepatitis panel is negative.  ?RUQ IS unremarkable.  ?Recheck liver panel in 4 weeks.  ? ?Hyponatremia ?Improving.  ?Etiology likely secondary to  dehydration.  Urine sodium low.  Continue with tube feeds.  ? ? ?Tobacco use disorder ?Encouraged cessation. ?--Nicotine patch; decreased to 7 mg on 3/15 ? ?Physical deconditioning ?Significant weakness in all extremities as a result of cervical myelopathy and acute illness.  Slowly improving while inpatient. ?Therapy eval recommending SNF.  ?TOC on board .  ? ?Urinary retention ?Foley catheter discontinued on 08/09/2021 with no current signs of urinary retention. ?--Continue bethanechol. ?-- ? Suspect leading to UTI.  ?- MONITOR and frequent bladder scan.  ? ? ?Protein-calorie malnutrition, severe ([place]) ?As evidenced by poor p.o. intake, significant muscle mass and subcu fat loss and significant weight loss (about 16 pounds since this hospitalization). ?Nutrition Problem: Severe Malnutrition (in the context of social/environmental circumstances) ?Etiology:  (inadequate energy intake) ?Signs/Symptoms: mild fat depletion, severe muscle depletion, severe muscle depletion ?Interventions: Refer to RD note for recommendations  ?--Dietitian following, appreciate assistance.   ?--s/p IR gtube 3/6. Pt pulled it out on 3/23 and it was replaced on 3/24.  ?- tube feeds to be restarted.  ? ?Hyperglycemia ?Probably secondary to steroids.  ?A1c is 5.2% ? ? ?Anemia of chronic disease:  ?Hemoglobin stable around 9.  ?  Continue to monitor.  ? ? ? ?Thrombocytosis:  ?Probably reactive.  ? ?  ? ? ? ?  ? ?Malnutrition Type: ? ?Nutrition Problem: Severe Malnutrition ?Etiology: social / environmental circumstances ? ? ?Malnutrition Characteristics: ? ?Signs/Symptoms: severe muscle depletion, severe fat depletion ? ? ?Nutrition Interventions: ? ?Interventions: Tube feeding, Prostat, MVI ? ?Estimated body mass index is 15.12 kg/m? as calculated from the following: ?  Height as of this encounter: 5\' 10"  (1.778 m). ?  Weight as of this encounter: 47.8 kg. ? ?Code Status: full code.  ?DVT Prophylaxis:  SCD's Start: 06/29/21 1231 ? ? ?Level of  Care: Level of care: Progressive ? ? ? ? ?Antimicrobials:  ? ?Anti-infectives (From admission, onward)  ? ? Start     Dose/Rate Route Frequency Ordered Stop  ? 08/17/21 1415  ampicillin-sulbactam (UNASYN) 1.5 g in sodium chl

## 2021-08-18 DIAGNOSIS — G959 Disease of spinal cord, unspecified: Secondary | ICD-10-CM | POA: Diagnosis not present

## 2021-08-18 DIAGNOSIS — R7989 Other specified abnormal findings of blood chemistry: Secondary | ICD-10-CM | POA: Diagnosis not present

## 2021-08-18 DIAGNOSIS — G9341 Metabolic encephalopathy: Secondary | ICD-10-CM | POA: Diagnosis not present

## 2021-08-18 DIAGNOSIS — E440 Moderate protein-calorie malnutrition: Secondary | ICD-10-CM | POA: Diagnosis not present

## 2021-08-18 DIAGNOSIS — J9601 Acute respiratory failure with hypoxia: Secondary | ICD-10-CM | POA: Diagnosis not present

## 2021-08-18 DIAGNOSIS — I2699 Other pulmonary embolism without acute cor pulmonale: Secondary | ICD-10-CM | POA: Diagnosis not present

## 2021-08-18 LAB — BLOOD CULTURE ID PANEL (REFLEXED) - BCID2

## 2021-08-18 LAB — GLUCOSE, CAPILLARY
Glucose-Capillary: 103 mg/dL — ABNORMAL HIGH (ref 70–99)
Glucose-Capillary: 111 mg/dL — ABNORMAL HIGH (ref 70–99)
Glucose-Capillary: 118 mg/dL — ABNORMAL HIGH (ref 70–99)
Glucose-Capillary: 124 mg/dL — ABNORMAL HIGH (ref 70–99)
Glucose-Capillary: 139 mg/dL — ABNORMAL HIGH (ref 70–99)
Glucose-Capillary: 141 mg/dL — ABNORMAL HIGH (ref 70–99)

## 2021-08-18 LAB — CBC WITH DIFFERENTIAL/PLATELET
Abs Immature Granulocytes: 0.06 10*3/uL (ref 0.00–0.07)
Basophils Absolute: 0 10*3/uL (ref 0.0–0.1)
Basophils Relative: 0 %
Eosinophils Absolute: 0 10*3/uL (ref 0.0–0.5)
Eosinophils Relative: 0 %
HCT: 26.8 % — ABNORMAL LOW (ref 39.0–52.0)
Hemoglobin: 9 g/dL — ABNORMAL LOW (ref 13.0–17.0)
Immature Granulocytes: 0 %
Lymphocytes Relative: 12 %
Lymphs Abs: 1.6 10*3/uL (ref 0.7–4.0)
MCH: 30.6 pg (ref 26.0–34.0)
MCHC: 33.6 g/dL (ref 30.0–36.0)
MCV: 91.2 fL (ref 80.0–100.0)
Monocytes Absolute: 2 10*3/uL — ABNORMAL HIGH (ref 0.1–1.0)
Monocytes Relative: 15 %
Neutro Abs: 9.8 10*3/uL — ABNORMAL HIGH (ref 1.7–7.7)
Neutrophils Relative %: 73 %
Platelets: 390 10*3/uL (ref 150–400)
RBC: 2.94 MIL/uL — ABNORMAL LOW (ref 4.22–5.81)
RDW: 14.6 % (ref 11.5–15.5)
WBC: 13.6 10*3/uL — ABNORMAL HIGH (ref 4.0–10.5)
nRBC: 0 % (ref 0.0–0.2)

## 2021-08-18 LAB — COMPREHENSIVE METABOLIC PANEL
ALT: 48 U/L — ABNORMAL HIGH (ref 0–44)
AST: 45 U/L — ABNORMAL HIGH (ref 15–41)
Albumin: 2.6 g/dL — ABNORMAL LOW (ref 3.5–5.0)
Alkaline Phosphatase: 172 U/L — ABNORMAL HIGH (ref 38–126)
Anion gap: 9 (ref 5–15)
BUN: 17 mg/dL (ref 6–20)
CO2: 23 mmol/L (ref 22–32)
Calcium: 9.1 mg/dL (ref 8.9–10.3)
Chloride: 102 mmol/L (ref 98–111)
Creatinine, Ser: 0.72 mg/dL (ref 0.61–1.24)
GFR, Estimated: >60 mL/min (ref >60)
Glucose, Bld: 131 mg/dL — ABNORMAL HIGH (ref 70–99)
Potassium: 3.7 mmol/L (ref 3.5–5.1)
Sodium: 134 mmol/L — ABNORMAL LOW (ref 135–145)
Total Bilirubin: 0.8 mg/dL (ref 0.3–1.2)
Total Protein: 7.7 g/dL (ref 6.5–8.1)

## 2021-08-18 LAB — PROCALCITONIN: Procalcitonin: 5.24 ng/mL

## 2021-08-18 LAB — LACTIC ACID, PLASMA: Lactic Acid, Venous: 1.5 mmol/L (ref 0.5–1.9)

## 2021-08-18 MED ORDER — CEFTRIAXONE SODIUM 2 G IJ SOLR
2.0000 g | INTRAMUSCULAR | Status: DC
  Administered 2021-08-18 – 2021-08-20 (×3): 2 g via INTRAVENOUS
  Filled 2021-08-18 (×3): qty 20

## 2021-08-18 MED ORDER — SODIUM CHLORIDE 0.9 % IV SOLN: 2.0000 g | INTRAVENOUS | Status: DC

## 2021-08-18 MED ORDER — LACTATED RINGERS IV BOLUS
1000.0000 mL | Freq: Once | INTRAVENOUS | Status: AC
  Administered 2021-08-18: 1000 mL via INTRAVENOUS

## 2021-08-18 MED ORDER — METHOCARBAMOL 500 MG PO TABS
500.0000 mg | ORAL_TABLET | ORAL | Status: AC
  Administered 2021-08-18: 500 mg
  Filled 2021-08-18: qty 1

## 2021-08-18 MED ORDER — LACTATED RINGERS IV SOLN: INTRAVENOUS | Status: DC

## 2021-08-18 MED ORDER — SODIUM CHLORIDE 0.9 % IV SOLN
3.0000 g | Freq: Three times a day (TID) | INTRAVENOUS | Status: DC
  Administered 2021-08-18: 3 g via INTRAVENOUS
  Filled 2021-08-18 (×2): qty 8

## 2021-08-18 MED ORDER — SODIUM CHLORIDE 0.9 % IV SOLN
INTRAVENOUS | Status: DC
Start: 2021-08-18 — End: 2021-08-18

## 2021-08-18 MED ORDER — METOPROLOL TARTRATE 12.5 MG HALF TABLET
12.5000 mg | ORAL_TABLET | ORAL | Status: AC
  Administered 2021-08-18: 12.5 mg
  Filled 2021-08-18: qty 1

## 2021-08-18 NOTE — Progress Notes (Signed)
PHARMACY - PHYSICIAN COMMUNICATION ?CRITICAL VALUE ALERT - BLOOD CULTURE IDENTIFICATION (BCID) ? ?Brian [name] is an 61 y.o. male who presented to [facility] on 06/29/2021  ? ?Assessment: Last temp 98.3, WBC 12.6 ? ?Name of physician (or Provider) Contacted: Dr. Margo Aye ? ?Current antibiotics: Unasyn ? ?Changes to prescribed antibiotics recommended:  ?No changes ? ?Results for orders placed or performed during the hospital encounter of 06/29/21  ?Blood Culture ID Panel (Reflexed) (Collected: 08/17/2021 10:09 AM)  ?Result Value Ref Range  ? Enterococcus faecalis NOT DETECTED NOT DETECTED  ? Enterococcus Faecium NOT DETECTED NOT DETECTED  ? Listeria monocytogenes NOT DETECTED NOT DETECTED  ? Staphylococcus species NOT DETECTED NOT DETECTED  ? Staphylococcus aureus (BCID) NOT DETECTED NOT DETECTED  ? Staphylococcus epidermidis NOT DETECTED NOT DETECTED  ? Staphylococcus lugdunensis NOT DETECTED NOT DETECTED  ? Streptococcus species NOT DETECTED NOT DETECTED  ? Streptococcus agalactiae NOT DETECTED NOT DETECTED  ? Streptococcus pneumoniae NOT DETECTED NOT DETECTED  ? Streptococcus pyogenes NOT DETECTED NOT DETECTED  ? A.calcoaceticus-baumannii NOT DETECTED NOT DETECTED  ? Bacteroides fragilis NOT DETECTED NOT DETECTED  ? Enterobacterales DETECTED (A) NOT DETECTED  ? Enterobacter cloacae complex NOT DETECTED NOT DETECTED  ? Escherichia coli DETECTED (A) NOT DETECTED  ? Klebsiella aerogenes NOT DETECTED NOT DETECTED  ? Klebsiella oxytoca NOT DETECTED NOT DETECTED  ? Klebsiella pneumoniae NOT DETECTED NOT DETECTED  ? Proteus species NOT DETECTED NOT DETECTED  ? Salmonella species NOT DETECTED NOT DETECTED  ? Serratia marcescens NOT DETECTED NOT DETECTED  ? Haemophilus influenzae NOT DETECTED NOT DETECTED  ? Neisseria meningitidis NOT DETECTED NOT DETECTED  ? Pseudomonas aeruginosa NOT DETECTED NOT DETECTED  ? Stenotrophomonas maltophilia NOT DETECTED NOT DETECTED  ? Candida albicans NOT DETECTED NOT DETECTED  ? Candida  auris NOT DETECTED NOT DETECTED  ? Candida glabrata NOT DETECTED NOT DETECTED  ? Candida krusei NOT DETECTED NOT DETECTED  ? Candida parapsilosis NOT DETECTED NOT DETECTED  ? Candida tropicalis NOT DETECTED NOT DETECTED  ? Cryptococcus neoformans/gattii NOT DETECTED NOT DETECTED  ? CTX-M ESBL NOT DETECTED NOT DETECTED  ? Carbapenem resistance IMP NOT DETECTED NOT DETECTED  ? Carbapenem resistance KPC NOT DETECTED NOT DETECTED  ? Carbapenem resistance NDM NOT DETECTED NOT DETECTED  ? Carbapenem resist OXA 48 LIKE NOT DETECTED NOT DETECTED  ? Carbapenem resistance VIM NOT DETECTED NOT DETECTED  ? ?Abran Duke, PharmD, BCPS ?Clinical Pharmacist ?Phone: [phone] ? ?

## 2021-08-18 NOTE — Progress Notes (Signed)
?  NEUROSURGERY PROGRESS NOTE  ? ?No issues overnight.  ? ?EXAM:  ?BP 119/80 (BP Location: Left Arm)   Pulse (!) 119   Temp 98.5 ?F (36.9 ?C) (Oral)   Resp 16   Ht 5\' 10"  (1.778 m)   Wt 47.9 kg   SpO2 100%   BMI 15.15 kg/m?  ? ?Awake, alert, responds to his name ?Speech hoarse ?CN grossly intact  ?MAE at least antigravity. LLE appears stronger than RLE ? ?IMPRESSION:  ?61 y.o. male s/p ACDF and subsequent EDH with protracted hospital course. Pt remains encephalopathic likely related to UTI. Blood cx pending ? ?PLAN: ?- Cont supportive care, appreciate hospitalist assistance ?- Cont Unasyn, f/u blood cx, urine cx ? ? ?67, MD ?[facility]  ? ?

## 2021-08-18 NOTE — Progress Notes (Signed)
Persistent tachycardia, heart rate 130-150's. BP normal . Discussed in depth with ON CALL MD. Gave 5mg  IV lopressor and 12.5 po.  ?

## 2021-08-18 NOTE — Progress Notes (Signed)
? ? ? [facility] ?                                                                            ? ? ?Nyree Smuck, is a 61 y.o. male, DOB - 05-28-1960, [id number] ?Admit date - 06/29/2021    ?Outpatient Primary MD for the patient is Pcp, No ? ?LOS - 50  days ? ? ? ?Brief summary  ? ?TARQUIN HAPP is a 61 year old male with past medical history significant for tobacco and EtOH use disorder who was admitted by neurosurgery for cervical myelopathy due to critical multilevel cervical spinal canal stenosis C3-6 with spinal cord compression and spinal cord signal change after recent fall with progressive upper and lower extremity weakness and tremors.  He was admitted on 2/3 and underwent ACDF of C3-4, C4-5, C5-5, and C6-7.  Post-operatively he was progressing slowly with residual weakness in both hands but improving lower extremity strength and function.  He developed some difficulty swallowing on 2/5 and SLP ordered and placed on thickened liquids.   Overnight 2/6, patient with increased difficulty swallowing and was made NPO but also noted to have dysarthria and difficulty moving extremities with numbness.  SLP evaluated and found to be moderate aspiration risk.   Also progressively tachycardic, tachypneic, and now febrile.  Code stroke activated and taken for CT/ CTA head and neck and was given decadron 10mg  once.  NIHSS 18.  CTH was negative for acute findings, and CTA head neck did not reveal any LVO but noted significant prevertebral soft tissue swelling with foci of gas present at the ventral epidural space at C4-5, small collection not excluded. On 2/7, patient with worsening confusion and concern for airway involvement, PCCM consulted and remained in the intensive care unit until he is transferred to the floor on 3/2 and PCCM requested transition to [facility] for medical assistance while patient remains under the neurosurgery service. ? ?Significant Hospital Events: ?2/3 ACDF C3-4, C4-5, C5-5, and C6-7  w/ Dr. Annette Stable ?2/6 SLP eval for difficulty swallowing ?2/7 Code stroke overnight, neg for LVO, CT showing soft tissue swelling.  PCCM consulted for concern of airway management and AMS.  Went for evacuation of hematoma  ?2/18 CT Abd/Pelvis: showing bilateral segmental Pes, started on heparin and showing worsening pneumonia, febrile to  103.  PCT rising, 34.6, restarted on abx ?2/19 possible aspiration w/ severe hypoxia; intubated; Switching from heparin to angiomax given subtherapeutic levels despite increase in rate. ?2/20 Echo shows normal LV systolic function. There was notation of a + McConnell's sign c/w a large pulmonary embolus which is consistent with the finding of bilateral segmental pulmonary emboli noted on CT 07/14/2021. ?2/21 rash appreciated on back; stopped cefepim/vanc switched to zosyn; required low dose levo with increase in fentanyl ?2/22 remains intubated; unable to extubate to do increase RR and thick secretions ?2/23 [name] infusion added ?2/24: weaning fentanyl.  ?2/26 extubated ?2/28 tachycardia persists ?3/2: Modified barium swallow, continues n.p.o. with core track in place, likely will need PEG tube. ?3/6: s/p IR G-tube placement ?3/23: patient pulled out G tube, became delirious possibly secondary to UTI.  ?3/24: replacement of the G tube and IV antibiotics started for UTI.  ?3/35 sepsis  from E coli bacteremia from UTI.  ? ? ? ?Assessment & Plan  ? ? ?Assessment and Plan: ?* Cervical myelopathy ([place]); severe cervical spinal stenosis with cord compression s/p ACDF [id number] on 2/3, complicated by epidural hematoma s/p evacuation 2/7 ?Patient initially presented s/p recent fall with progressive upper and lower extremity weakness and tremors, He was found to have critical multilevel cervical spinal stenosis @ C3-3 6 with spinal cord compression and spinal cord signal change and underwent ACDF C3-4, C4-5, C5-5, and C6/7 by neurosurgery Dr. Trenton Gammon on 06/29/2021.  Postoperative leg complicated by  epidural hematoma s/p evacuation on 2/7. ?RECOMMENDATIONS  as per Neuro surgery.  ?Therapy eval recommending SNF.  ?Toc on board. ? ?Acute respiratory failure with hypoxia ([place]) ?Likely Multifactorial with significant dysphagia and recurrent aspiration pneumonia events during initial hospitalization following ACDF surgery with postoperative complications of postoperative cervical hematoma.  Patient did require ventilatory support in the intensive care unit and was successfully extubated on 07/22/2021.  Patient completed extensive course of empiric antibiotics.   ?Weaned off oxygen and stable on RA.  ? ? ?Acute pulmonary embolism ([place]) ?CTA chest showed bilateral segmental PE.  LE [place] negative for DVT.   ?Echocardiogram does not show any right heart strain.  ?Transitioned to eliquis for anti coagulation.  ? ?Acute metabolic encephalopathy ?CT head, MRI brain, EEG, TSH, B12, ammonia unrevealing.   ?Etiology likely secondary to  e coli bacteremia and UTI.  ?Improving, he is more awake,  oriented to place and person, and requesting for water . ? ? ? ?Severe sepsis due to recurrent aspiration pneumonia ?And E coli bacteremia from UTI on 3/24 ?Started the patient on IV rocephin 2g IV daily,.  ?Follow the blood and urine cultures .  ?Lactic acid wnl.  ?Follow pro calcitonin. ?Keep MAP >65 mmhg.  ?Continue with IV fluids.  ? ?Sinus tachycardia ?Multifactorial including sepsis, dehydration, PE, agitation.   ?EKG shows sinus tachycardia, improved with IV fluids.  ?On metoprolol.  ? ? ?Elevated liver enzymes ?Etiology likely secondary to sepsis from aspiration pneumonia as above.   ?Improving.  ?Acute hepatitis panel is negative.  ?RUQ IS unremarkable.  ?Recheck liver panel in 4 weeks.  ? ?Hyponatremia ?Improving.  ? ? ? ?Tobacco use disorder ?Encouraged cessation. ?--Nicotine patch; decreased to 7 mg on 3/15 ? ?Physical deconditioning ?Significant weakness in all extremities as a result of cervical myelopathy and acute illness.   Slowly improving while inpatient. ?Therapy eval recommending SNF.  ?TOC on board .  ? ?Urinary retention ?Foley catheter discontinued on 08/09/2021 with no current signs of urinary retention. ?--Continue bethanechol. ?-- ? Suspect leading to UTI and e coli bacteremia. .  ?  ? ? ?Protein-calorie malnutrition, severe ([place]) ?As evidenced by poor p.o. intake, significant muscle mass and subcu fat loss and significant weight loss (about 16 pounds since this hospitalization). ?Nutrition Problem: Severe Malnutrition (in the context of social/environmental circumstances) ?Etiology:  (inadequate energy intake) ?Signs/Symptoms: mild fat depletion, severe muscle depletion, severe muscle depletion ?Interventions: Refer to RD note for recommendations  ?--Dietitian following, appreciate assistance.   ?--s/p IR gtube 3/6. Pt pulled it out on 3/23 and it was replaced on 3/24.  ?- tube feeds to be restarted.  ? ?Hyperglycemia ?Probably secondary to steroids.  ?A1c is 5.2% ? ? ?Anemia of chronic disease:  ?Hemoglobin stable around 9.  ?Continue to monitor.  ? ? ? ?Thrombocytosis:  ?Probably reactive.  ? ?  ? ? ? ?  ? ?Malnutrition Type: ? ?Nutrition Problem: Severe  Malnutrition ?Etiology: social / environmental circumstances ? ? ?Malnutrition Characteristics: ? ?Signs/Symptoms: severe muscle depletion, severe fat depletion ? ? ?Nutrition Interventions: ? ?Interventions: Tube feeding, Prostat, MVI ? ?Estimated body mass index is 15.15 kg/m? as calculated from the following: ?  Height as of this encounter: 5\' 10"  (1.778 m). ?  Weight as of this encounter: 47.9 kg. ? ?Code Status: full code.  ?DVT Prophylaxis:  SCD's Start: 06/29/21 1231 ? ? ?Level of Care: Level of care: Progressive ? ? ? ? ?Antimicrobials:  ? ?Anti-infectives (From admission, onward)  ? ? Start     Dose/Rate Route Frequency Ordered Stop  ? 08/19/21 0000  cefTRIAXone (ROCEPHIN) 2 g in sodium chloride 0.9 % 100 mL IVPB  Status:  Discontinued       ? 2 g ?200 mL/hr over  30 Minutes Intravenous Every 24 hours 08/18/21 0951 08/18/21 0951  ? 08/18/21 0951  cefTRIAXone (ROCEPHIN) 2 g in sodium chloride 0.9 % 100 mL IVPB       ? 2 g ?200 mL/hr over 30 Minutes Intravenous Ever

## 2021-08-18 NOTE — Progress Notes (Signed)
?                                                   ?Palliative Care Progress Note, Assessment & Plan  ? ?Patient Name: Brian [name]       Date: 08/18/2021 ?DOB: [birth year]  Age: 61 y.o. MRN#: [id number] ?Attending Physician: Brian Sicks, MD ?Primary Care Physician: Pcp, No ?Admit Date: 06/29/2021 ? ?Reason for Consultation/Follow-up: Establishing goals of care ? ?Subjective: ?Patient is lying in bed in no apparent distress.  He does not acknowledge my presence with eye or head movement.  However, he vocalizes with a whispering, hoarse voice.  He is able to tell me his name, date of birth, his son's name, and that he is sick in the hospital.  Patient does not track me. PEG in place and feeds running without complication.  ? ?HPI: ?60 y.o. 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 ?  ?Today is day 50 of hospitalization. ? ?Summary of counseling/coordination of care: ?After reviewing the patient's chart and assessing the patient at bedside, attempted to speak with patient regarding current plan of care.  He is unable to have a meaningful interaction with me.  However, he is more awake and alert denies found him to be yesterday. ? ?I spoke to patient's son Brian [name] over the phone and shared that patient's alert to self, place, and situation. ? ?Brian [name] again inquired about increasing his tube feedings.  After reviewing registered dietitians note, I shared with Brian [name] that tube feed goal is at 2200 cal/day.  Brian [name] asked if he could speak in more detail to dietician in regards to tube feeds. Dietician made  aware. ? ?Brian [name] shared that a cousin who visited yesterday conveyed that patient was "chained to the bed".  I assured Brian [name] that patient was not chained to the bed but was in soft restraints to help prevent him from dislodging PEG tube again.  I shared that these measures are for safety, that skin and patient's mentation are monitored closely and consistently, and that as patient's mentation improves they will likely be removed.   ? ?Questions and concerns were addressed.  I shared with Brian [name] that the palliative medicine team will continue to follow the patient throughout his hospital elevation and be a support to not only the patient but also the family. ? ?Code Status: ?Full code ? ?Prognosis: ?Unable to determine ? ?Discharge Planning: ?To Be Determined ? ?Care plan was discussed with patient, RN, patient's son Brian [name] ? ?Physical Exam ?Vitals and nursing note reviewed.  ?HENT:  ?   Head: Normocephalic.  ?   Mouth/Throat:  ?   Mouth: Mucous membranes are dry.  ?   Comments: Poor dentition ?Cardiovascular:  ?   Rate and Rhythm: Normal rate.  ?   Pulses: Normal pulses.  ?Pulmonary:  ?   Effort: Pulmonary effort is normal.  ?Abdominal:  ?   Palpations: Abdomen is soft.  ?   Comments: PEG in place  ?Musculoskeletal:  ?   Comments: Generalized weakness  ?Neurological:  ?   Mental Status: He is alert.  ?  Comments: Oriented to self, place, and situation  ?Psychiatric:     ?   Thought Content: Thought content normal.  ?         ? ?Palliative Assessment/Data: 50% ? ? ? ?Total Time 25 minutes  ?Greater than 50%  of this time was spent counseling and coordinating care related to the above assessment and plan. ? ?Thank you for allowing the Palliative Medicine Team to assist in the care of this patient. ? ?Dalbert Mayotte. Siddarth Hsiung, DNP, FNP-BC ?Palliative Medicine Team ?Team Phone # [phone] ?  ?

## 2021-08-18 NOTE — Consult Note (Signed)
? ?                                                                                ?Consultation Note ?Date: 08/18/2021  ? ?Patient Name: Brian [name]  ?DOB: 07-10-1960  MRN: [id number]  Age / Sex: 60 y.o., male  ?PCP: Pcp, No ?Referring Physician: Earnie Larsson, MD ? ?Reason for Consultation: Establishing goals of care ? ?HPI/Patient Profile: 61 y.o. 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 ? ?Today is day 81 of hospitalization. ? ?Clinical Assessment and Goals of Care: ?I have reviewed medical records including EPIC notes, labs and imaging, assessed the patient and then met with patient in AM and then son [name] in PM  to discuss diagnosis prognosis, GOC, EOL wishes, disposition and options. Patient is unable to participate in any meaningful or productive discussions of his health. Therefore, I met with patient's son [name] in patient's room while patient was in IR to have PEG replaced.  ? ?I introduced Palliative Medicine as specialized medical care for people living with serious illness. It focuses on providing relief from the symptoms and stress of a serious illness. The goal is to improve quality of life for both the patient and the family. ? ?[name] and I discussed a brief life review of the patient.  Patient is not married and [name] is his only child.  Patient's mother and sister live in [place] and check on him often.  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 ? ?Prior to admission patient lived alone and was independent with all ADLs.   [name] shares he was a man that always like to be on the move and to see him constantly in the hospital bed is not something that he knows his father would want to endure. ? ?We discussed patient's current illness and what it means in the larger context of patient's on-going co-morbidities. I attempted to elicit values and goals of care important to the patient.  [name] shares that his father would want to continue with the feeding tube and get physical therapy to be able to walk again. ? ?[name] asked appropriate questions regarding PEG tube dislodgment and replacement, physical therapy, and disposition plans.  WE reviewed that patient is confused and that medical team suspects confusion is from UTI, though urinalysis is pending at this time.  ? ?Therapeutic silence and active listening provided for [name] to share his thoughts and emotions regarding patient's current health status.  [name] says he was just shocked to hear that his father had come so far.  He and knows that his father has been in the hospital for quite some time and is beginning to think that perhaps he would not return to baseline.  We discussed that medical  team plans to treat the treatable and give his father the best chance at rehabilitating is close to baseline as previously functioning.  Speech and language therapy continues to work with him to encourage safe ability to swallow and have p.o. intake. ? ?[name] asked if daily caloric intake of 1900 cal could be increased.  I shared that I will voice his concerns to the medical team but that advancing his tube feeds is done slowly to avoid aspiration.  Goal rate of 60 mL/h was reached prior to patient dislodging PEG tube.  [name] says he understands and was just ready to try to get more calories into his father to help him become stronger. ? ?We discussed that patient will likely need rehabilitation prior to going back home.  However, nutrition also remains a challenge since patient continues to  remove PEG tube. ? ?Family is facing treatment option decisions, advanced directive, and anticipatory care needs.  ?  ?Discussed with [name] the importance of continued conversation with family and the medical providers regarding overall plan of care and treatment options, ensuring decisions are within the context of the patient?s values and GOCs.   ? ?Questions and concerns were addressed. [name] was encouraged to call with questions or concerns. I shared with [name] that I plan to see patient tomorrow and will be in touch after my rounds.  ? ?Primary Decision Maker ?NEXT OF KIN ? ?Code Status/Advance Care Planning: ?Full code ? ?Prognosis:   ?Unable to determine ? ?Discharge Planning: To Be Determined ? ?Primary Diagnoses: ?Present on Admission: ? Cervical myelopathy ([place]); severe cervical spinal stenosis with cord compression s/p ACDF X9-0 on 2/3, complicated by epidural hematoma s/p evacuation 2/7 ? Tobacco use disorder ? Physical deconditioning ? Elevated liver enzymes ? Hyponatremia ? Sinus tachycardia ? Urinary retention ? ? ?Physical Exam ?Vitals and nursing note reviewed.  ?Constitutional:   ?   Comments: Thin, frail  ?HENT:  ?   Mouth/Throat:  ?   Mouth: Mucous membranes are dry.  ?   Comments: Poor dentition ?Cardiovascular:  ?   Rate and Rhythm: Normal rate.  ?Pulmonary:  ?   Effort: Pulmonary effort is normal.  ?Abdominal:  ?   Palpations: Abdomen is soft.  ?Musculoskeletal:  ?   Comments: Constant fidgeting and movement  ?Neurological:  ?   Mental Status: He is alert.  ?   Comments: Oriented to self  ? ? ?Palliative Assessment/Data: 40% ? ? ? ? ?I discussed this patient's plan of care with patient, patient's son [name], Dr. Karleen Hampshire. ? ?Thank you for this consult. Palliative medicine will continue to follow and assist holistically.  ? ?Time Total: 75 minutes ?Greater than 50%  of this time was spent counseling and coordinating care related to the above assessment and plan. ? ?Signed by: ?Jordan Hawks,  DNP, FNP-BC ?Palliative Medicine ? ?  ?Please contact Palliative Medicine Team phone at [phone] for questions and concerns.  ?For individual provider: See Amion ? ? ? ? ? ? ? ? ? ? ? ? ?  ?

## 2021-08-18 NOTE — Progress Notes (Signed)
SLP Cancellation Note ? ?Patient Details ?Name: Brian [name] ?MRN: [id number] ?DOB: 1961-04-22 ? ? ?Cancelled treatment:       Reason Eval/Treat Not Completed: Patient at procedure or test/unavailable; pt remains NPO; ST will continue efforts for diet tolerance during acute stay.   ? ? ?Tressie Stalker, M.S., CCC-SLP ?08/18/2021, 9:12 AM ?

## 2021-08-19 DIAGNOSIS — Z7189 Other specified counseling: Secondary | ICD-10-CM

## 2021-08-19 DIAGNOSIS — I2699 Other pulmonary embolism without acute cor pulmonale: Secondary | ICD-10-CM | POA: Diagnosis not present

## 2021-08-19 DIAGNOSIS — G9341 Metabolic encephalopathy: Secondary | ICD-10-CM | POA: Diagnosis not present

## 2021-08-19 DIAGNOSIS — Z515 Encounter for palliative care: Secondary | ICD-10-CM | POA: Diagnosis not present

## 2021-08-19 DIAGNOSIS — G959 Disease of spinal cord, unspecified: Secondary | ICD-10-CM | POA: Diagnosis not present

## 2021-08-19 DIAGNOSIS — R7989 Other specified abnormal findings of blood chemistry: Secondary | ICD-10-CM | POA: Diagnosis not present

## 2021-08-19 LAB — CBC
HCT: 25.9 % — ABNORMAL LOW (ref 39.0–52.0)
Hemoglobin: 8.7 g/dL — ABNORMAL LOW (ref 13.0–17.0)
MCH: 30.3 pg (ref 26.0–34.0)
MCHC: 33.6 g/dL (ref 30.0–36.0)
MCV: 90.2 fL (ref 80.0–100.0)
Platelets: 388 10*3/uL (ref 150–400)
RBC: 2.87 MIL/uL — ABNORMAL LOW (ref 4.22–5.81)
RDW: 14.9 % (ref 11.5–15.5)
WBC: 10.7 10*3/uL — ABNORMAL HIGH (ref 4.0–10.5)
nRBC: 0 % (ref 0.0–0.2)

## 2021-08-19 LAB — URINE CULTURE: Culture: >=100000 — AB

## 2021-08-19 LAB — GLUCOSE, CAPILLARY
Glucose-Capillary: 101 mg/dL — ABNORMAL HIGH (ref 70–99)
Glucose-Capillary: 110 mg/dL — ABNORMAL HIGH (ref 70–99)
Glucose-Capillary: 112 mg/dL — ABNORMAL HIGH (ref 70–99)
Glucose-Capillary: 116 mg/dL — ABNORMAL HIGH (ref 70–99)
Glucose-Capillary: 129 mg/dL — ABNORMAL HIGH (ref 70–99)
Glucose-Capillary: 99 mg/dL (ref 70–99)

## 2021-08-19 LAB — PROCALCITONIN: Procalcitonin: 3.48 ng/mL

## 2021-08-19 LAB — BASIC METABOLIC PANEL
Anion gap: 7 (ref 5–15)
BUN: 12 mg/dL (ref 6–20)
CO2: 24 mmol/L (ref 22–32)
Calcium: 9.4 mg/dL (ref 8.9–10.3)
Chloride: 102 mmol/L (ref 98–111)
Creatinine, Ser: 0.61 mg/dL (ref 0.61–1.24)
GFR, Estimated: >60 mL/min (ref >60)
Glucose, Bld: 119 mg/dL — ABNORMAL HIGH (ref 70–99)
Potassium: 3.9 mmol/L (ref 3.5–5.1)
Sodium: 133 mmol/L — ABNORMAL LOW (ref 135–145)

## 2021-08-19 NOTE — Progress Notes (Signed)
? ? ? [facility] ?                                                                            ? ? ?Brian [name], is a 61 y.o. male, DOB - 08-30-1960, [id number] ?Admit date - 06/29/2021    ?Outpatient Primary MD for the patient is Pcp, No ? ?LOS - 51  days ? ? ? ?Brief summary  ? 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  He was admitted on 2/3 and underwent ACDF of C3-4, C4-5, C5-5, and C6-7.  Post-operatively he was progressing slowly with residual weakness in both hands but improving lower extremity strength and function.  He developed some difficulty swallowing on 2/5 and SLP ordered and placed on thickened liquids.   Overnight 2/6, patient with increased difficulty swallowing and was made NPO but also noted to have dysarthria and difficulty moving extremities with numbness.  SLP evaluated and found to be moderate aspiration risk.   Also progressively tachycardic, tachypneic, and now febrile.  Code stroke activated and taken for CT/ CTA head and neck and was given decadron 10mg  once.  NIHSS 18.  CTH was negative for acute findings, and CTA head neck did not reveal any LVO but noted significant prevertebral soft tissue swelling with foci of gas present at the ventral epidural space at C4-5, small collection not excluded. On 2/7, patient with worsening confusion and concern for airway involvement, PCCM consulted and remained in the intensive care unit until he is transferred to the floor on 3/2 and PCCM requested transition to [facility] for medical assistance while patient remains under the neurosurgery service. ? ?Significant Hospital Events: ?2/3 ACDF C3-4, C4-5, C5-5, and C6-7  w/ Dr. Annette Stable ?2/6 SLP eval for difficulty swallowing ?2/7 Code stroke overnight, neg for LVO, CT showing soft tissue swelling.  PCCM consulted for concern of airway management and AMS.  Went for evacuation of hematoma  ?2/18 CT Abd/Pelvis: showing bilateral segmental Pes, started on heparin and showing worsening pneumonia, febrile to  103.  PCT rising, 34.6, restarted on abx ?2/19 possible aspiration w/ severe hypoxia; intubated; Switching from heparin to angiomax given subtherapeutic levels despite increase in rate. ?2/20 Echo shows normal LV systolic function. There was notation of a + McConnell's sign c/w a large pulmonary embolus which is consistent with the finding of bilateral segmental pulmonary emboli noted on CT 07/14/2021. ?2/21 rash appreciated on back; stopped cefepim/vanc switched to zosyn; required low dose levo with increase in fentanyl ?2/22 remains intubated; unable to extubate to do increase RR and thick secretions ?2/23 [name] infusion added ?2/24: weaning fentanyl.  ?2/26 extubated ?2/28 tachycardia persists ?3/2: Modified barium swallow, continues n.p.o. with core track in place, likely will need PEG tube. ?3/6: s/p IR G-tube placement ?3/23: patient pulled out G tube, became delirious possibly secondary to UTI.  ?3/24: replacement of the G tube and IV antibiotics started for UTI.  ?3/25 sepsis  from E coli bacteremia from UTI.  ? ? ? ?Assessment & Plan  ? ? ?Assessment and Plan: ?* Cervical myelopathy ([place]); severe cervical spinal stenosis with cord compression s/p ACDF [id number] on 2/3, complicated by epidural hematoma s/p evacuation 2/7 ?Patient initially presented s/p recent fall with progressive upper and lower extremity weakness and tremors, He was found to have critical multilevel cervical spinal stenosis @ C3-3 6 with spinal cord compression and spinal cord signal change and underwent ACDF C3-4, C4-5, C5-5, and C6/7 by neurosurgery Dr. Trenton Gammon on 06/29/2021.  Postoperative leg complicated by  epidural hematoma s/p evacuation on 2/7. ?Recommendations  as per Neuro surgery.  ?Therapy eval recommending SNF.  ?Toc on board. ? ?Acute respiratory failure with hypoxia ([place]) ?Likely Multifactorial with significant dysphagia and recurrent aspiration pneumonia events during initial hospitalization following ACDF surgery with postoperative complications of postoperative cervical hematoma.  Patient did require ventilatory support in the intensive care unit and was successfully extubated on 07/22/2021.  Patient completed extensive course of empiric antibiotics.   ?Weaned off oxygen and stable on RA.  ? ? ?Acute pulmonary embolism ([place]) ?CTA chest showed bilateral segmental PE.  LE [place] negative for DVT.   ?Echocardiogram does not show any right heart strain.  ?Transitioned to eliquis for anti coagulation.  ? ?Acute metabolic encephalopathy ?CT head, MRI brain, EEG, TSH, B12, ammonia unrevealing.   ?Etiology likely secondary to  e coli bacteremia and UTI.  ?He is alert and answering simple questions.  ? ? ? ?Severe sepsis due to recurrent aspiration pneumonia ?And E coli bacteremia from UTI on 3/24 ?Started the patient on IV rocephin 2 g IV daily,. Reviewed the sensitivities .  ?Follow the blood and urine cultures .  ?Lactic acid wnl.  ?Follow pro calcitonin. Procalcitonin improved to 3.48.  ?Keep MAP >65 mmhg.  ?Continue with IV fluids.  ? ?Sinus tachycardia ?Multifactorial including sepsis, dehydration, PE, agitation.   ?EKG shows sinus tachycardia, improved with IV fluids.  ?On metoprolol.  ? ? ?Elevated liver enzymes ?Etiology likely secondary to sepsis from aspiration pneumonia as above.   ?Improving.  ?Acute hepatitis panel is negative.  ?RUQ IS unremarkable.  ?Recheck liver panel in 4 weeks.  ? ?Hyponatremia ?Improving. Sodium is 133.  ? ?Tobacco use disorder ?Encouraged cessation. ?--Nicotine patch; decreased to 7 mg on 3/15 ? ?Physical deconditioning ?Significant weakness in all extremities as a result of  cervical myelopathy and acute illness.  Slowly improving while inpatient. ?Therapy eval recommending SNF.  ?TOC on board .  ? ?Urinary retention ?Foley catheter discontinued on 08/09/2021 with no current signs of urinary retention. ?--Continue bethanechol. ?-- ? Suspect leading to UTI and e coli bacteremia. .  ?  ? ? ?Protein-calorie malnutrition, severe ([place]) ?As evidenced by poor p.o. intake, significant muscle mass and subcu fat loss and significant weight loss (about 16 pounds since this hospitalization). ?Nutrition Problem: Severe Malnutrition (in the context of social/environmental circumstances) ?Etiology:  (inadequate energy intake) ?Signs/Symptoms: mild fat depletion, severe muscle depletion, severe muscle depletion ?Interventions: Refer to RD note for recommendations  ?--Dietitian following, appreciate assistance.   ?--s/p IR gtube 3/6. Pt pulled it out on 3/23 and it was replaced on 3/24.  ?- tube feeds to be restarted.  ? ?Hyperglycemia ?Probably secondary to steroids.  ?A1c is 5.2% ? ? ?Anemia of chronic disease ?Continue to monitor. ?Transfuse to keep hemoglobin greater than 7  ? ? ? ?Thrombocytosis:  ?Probably reactive.  ?Resolved.  ? ?  ? ? ? ?  ? ?  Malnutrition Type: ? ?Nutrition Problem: Severe Malnutrition ?Etiology: social / environmental circumstances ? ? ?Malnutrition Characteristics: ? ?Signs/Symptoms: severe muscle depletion, severe fat depletion ? ? ?Nutrition Interventions: ? ?Interventions: Tube feeding, Prostat, MVI ? ?Estimated body mass index is 15.15 kg/m? as calculated from the following: ?  Height as of this encounter: 5\' 10"  (1.778 m). ?  Weight as of this encounter: 47.9 kg. ? ?Code Status: full code.  ?DVT Prophylaxis:  SCD's Start: 06/29/21 1231 ? ? ?Level of Care: Level of care: Progressive ? ? ? ? ?Antimicrobials:  ? ?Anti-infectives (From admission, onward)  ? ? Start     Dose/Rate Route Frequency Ordered Stop  ? 08/19/21 0000  cefTRIAXone (ROCEPHIN) 2 g in sodium chloride 0.9 %  100 mL IVPB  Status:  Discontinued       ? 2 g ?200 mL/hr over 30 Minutes Intravenous Every 24 hours 08/18/21 0951 08/18/21 0951  ? 08/18/21 0951  cefTRIAXone (ROCEPHIN) 2 g in sodium chloride 0.9 % 100 mL IVP

## 2021-08-19 NOTE — Progress Notes (Signed)
?  NEUROSURGERY PROGRESS NOTE  ? ?No issues overnight. ? ?EXAM:  ?BP 123/71 (BP Location: Left Arm)   Pulse 86   Temp 98.5 ?F (36.9 ?C) (Oral)   Resp 18   Ht 5\' 10"  (1.778 m)   Wt 47.9 kg   SpO2 100%   BMI 15.15 kg/m?  ? ?Awake, alert ?Speech hoarse but fluent, apporpriate ?CN grossly intact  ?MAE well, appears symmetric today ? ?IMPRESSION:  ?61 y.o. male s/p ACDF and subsequent EDH with protracted hospital course. Pt remains encephalopathic likely related to UTI and E. Coli in blood cx ? ?PLAN: ?- Cont IV abx ?- Cont supportive care, appreciate hospitalist assistance ? ? ? ?67, MD ?[facility]  ? ?

## 2021-08-19 NOTE — Progress Notes (Signed)
? ?  Palliative Medicine Inpatient Follow Up Note ? ?HPI: ?61 y.o. 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 ?  ?Today is day 45 of hospitalization. ? ?Palliative care was asked to get involved to further address goals of care in the setting of clinical decline/AMS.  ? ?Today's Discussion (08/19/2021): ? ?*Please note that this is a verbal dictation therefore any spelling or grammatical errors are due to the "[employer]ette One" system interpretation. ? ?Chart reviewed inclusive of vital signs, progress notes, laboratory results, and diagnostic images.  ? ?I met with Brian [name] at bedside this afternoon. He shares that is feel well today. I shared that the medical team was quite concerned about him a few days ago in the setting of his delirium. We reviewed that he has a urinary tract infection which is improving.He shares, "that's good". ? ?Long term placement is pending based upon approval of medicaid.  ? ?No family present at bedside at the time of assessment a phone call update will be provided. ? ?Objective Assessment: ?Vital Signs ?Vitals:  ? 08/19/21 1152 08/19/21 1527  ?BP: 114/73 123/85  ?Pulse: 86 92  ?Resp: 18 18  ?Temp: 98.7 ?F (37.1 ?C) 98.5 ?F (36.9 ?C)  ?SpO2: 99% 100%  ? ? ?Intake/Output Summary (Last 24 hours) at 08/19/2021 1602 ?Last data filed at 08/19/2021 1234 ?Gross per 24 hour  ?Intake 3602 ml  ?Output 2600 ml  ?Net 1002 ml  ? ?Last Weight  Most recent update: 08/18/2021  4:12 AM  ? ? Weight  ?47.9 kg (105 lb 9.6 oz)  ?      ? ?  ? ?Gen:  Frail AA M in NAD ?HEENT: Dry mucous membranes ?CV: Regular rate and rhythm ?PULM: On RA ?ABD: soft/nontender ?EXT: No edema ?Neuro: Alert and oriented x3 ? ?SUMMARY OF  RECOMMENDATIONS   ?Full Code/ Full Scope of Care ? ?Improvements have been made in the setting of tx of UTI ? ?Patient no longer requires restraints ? ?Have requested dietician, [name] reach out to patients son, [name] tomorrow per his prior request ? ?Ongoing incremental Palliative care support ? ? ?MDM - Moderate ? ?_____________________________________________________________________________________ ?Tacey Ruiz ?[facility] Team ?Team Cell Phone: [phone] ?Please utilize secure chat with additional questions, if there is no response within 30 minutes please call the above phone number ? ?Palliative Medicine Team providers are available by phone from 7am to 7pm daily and can be reached through the team cell phone.  ?Should this patient require assistance outside of these hours, please call the patient's attending physician. ? ? ? ? ?

## 2021-08-20 DIAGNOSIS — A419 Sepsis, unspecified organism: Secondary | ICD-10-CM | POA: Diagnosis not present

## 2021-08-20 DIAGNOSIS — R7989 Other specified abnormal findings of blood chemistry: Secondary | ICD-10-CM | POA: Diagnosis not present

## 2021-08-20 DIAGNOSIS — R7881 Bacteremia: Secondary | ICD-10-CM

## 2021-08-20 DIAGNOSIS — B9620 Unspecified Escherichia coli [E. coli] as the cause of diseases classified elsewhere: Secondary | ICD-10-CM

## 2021-08-20 DIAGNOSIS — G959 Disease of spinal cord, unspecified: Secondary | ICD-10-CM | POA: Diagnosis not present

## 2021-08-20 DIAGNOSIS — T17908A Unspecified foreign body in respiratory tract, part unspecified causing other injury, initial encounter: Secondary | ICD-10-CM | POA: Diagnosis not present

## 2021-08-20 DIAGNOSIS — N390 Urinary tract infection, site not specified: Secondary | ICD-10-CM

## 2021-08-20 DIAGNOSIS — R339 Retention of urine, unspecified: Secondary | ICD-10-CM | POA: Diagnosis not present

## 2021-08-20 DIAGNOSIS — Z515 Encounter for palliative care: Secondary | ICD-10-CM | POA: Diagnosis not present

## 2021-08-20 DIAGNOSIS — R6520 Severe sepsis without septic shock: Secondary | ICD-10-CM

## 2021-08-20 DIAGNOSIS — G9341 Metabolic encephalopathy: Secondary | ICD-10-CM | POA: Diagnosis not present

## 2021-08-20 DIAGNOSIS — T83511A Infection and inflammatory reaction due to indwelling urethral catheter, initial encounter: Secondary | ICD-10-CM

## 2021-08-20 DIAGNOSIS — I2699 Other pulmonary embolism without acute cor pulmonale: Secondary | ICD-10-CM | POA: Diagnosis not present

## 2021-08-20 LAB — GLUCOSE, CAPILLARY
Glucose-Capillary: 104 mg/dL — ABNORMAL HIGH (ref 70–99)
Glucose-Capillary: 98 mg/dL (ref 70–99)
Glucose-Capillary: 113 mg/dL — ABNORMAL HIGH (ref 70–99)
Glucose-Capillary: 103 mg/dL — ABNORMAL HIGH (ref 70–99)
Glucose-Capillary: 111 mg/dL — ABNORMAL HIGH (ref 70–99)
Glucose-Capillary: 112 mg/dL — ABNORMAL HIGH (ref 70–99)

## 2021-08-20 LAB — CULTURE, BLOOD (ROUTINE X 2)
Special Requests: ADEQUATE
Special Requests: ADEQUATE

## 2021-08-20 LAB — COMPREHENSIVE METABOLIC PANEL
ALT: 52 U/L — ABNORMAL HIGH (ref 0–44)
AST: 45 U/L — ABNORMAL HIGH (ref 15–41)
Albumin: 2.3 g/dL — ABNORMAL LOW (ref 3.5–5.0)
Alkaline Phosphatase: 143 U/L — ABNORMAL HIGH (ref 38–126)
Anion gap: 6 (ref 5–15)
BUN: 11 mg/dL (ref 6–20)
CO2: 25 mmol/L (ref 22–32)
Calcium: 9.6 mg/dL (ref 8.9–10.3)
Chloride: 105 mmol/L (ref 98–111)
Creatinine, Ser: 0.58 mg/dL — ABNORMAL LOW (ref 0.61–1.24)
GFR, Estimated: >60 mL/min (ref >60)
Glucose, Bld: 115 mg/dL — ABNORMAL HIGH (ref 70–99)
Potassium: 3.7 mmol/L (ref 3.5–5.1)
Sodium: 136 mmol/L (ref 135–145)
Total Bilirubin: 0.3 mg/dL (ref 0.3–1.2)
Total Protein: 7.3 g/dL (ref 6.5–8.1)

## 2021-08-20 LAB — CBC WITH DIFFERENTIAL/PLATELET
Abs Immature Granulocytes: 0.01 10*3/uL (ref 0.00–0.07)
Basophils Absolute: 0 10*3/uL (ref 0.0–0.1)
Basophils Relative: 0 %
Eosinophils Absolute: 0.2 10*3/uL (ref 0.0–0.5)
Eosinophils Relative: 3 %
HCT: 25 % — ABNORMAL LOW (ref 39.0–52.0)
Hemoglobin: 8.3 g/dL — ABNORMAL LOW (ref 13.0–17.0)
Immature Granulocytes: 0 %
Lymphocytes Relative: 30 %
Lymphs Abs: 2.3 10*3/uL (ref 0.7–4.0)
MCH: 30.4 pg (ref 26.0–34.0)
MCHC: 33.2 g/dL (ref 30.0–36.0)
MCV: 91.6 fL (ref 80.0–100.0)
Monocytes Absolute: 0.9 10*3/uL (ref 0.1–1.0)
Monocytes Relative: 11 %
Neutro Abs: 4.3 10*3/uL (ref 1.7–7.7)
Neutrophils Relative %: 56 %
Platelets: 392 10*3/uL (ref 150–400)
RBC: 2.73 MIL/uL — ABNORMAL LOW (ref 4.22–5.81)
RDW: 15 % (ref 11.5–15.5)
WBC: 7.7 10*3/uL (ref 4.0–10.5)
nRBC: 0 % (ref 0.0–0.2)

## 2021-08-20 LAB — PROCALCITONIN: Procalcitonin: 2.06 ng/mL

## 2021-08-20 MED ORDER — CEFAZOLIN SODIUM-DEXTROSE 2-4 GM/100ML-% IV SOLN
2.0000 g | Freq: Three times a day (TID) | INTRAVENOUS | Status: AC
  Administered 2021-08-21 – 2021-08-25 (×15): 2 g via INTRAVENOUS
  Filled 2021-08-20 (×15): qty 100

## 2021-08-20 NOTE — Consult Note (Signed)
?  [facility]  ? ? ?Date of Admission:  06/29/2021    ? ?Reason for Consult: E coli bacteremia ?    ?Referring Physician: TRH ? ?Current antibiotics: ?Ceftriaxone 3/25-present ? ? ?ASSESSMENT:   ? ?61 y.o. male admitted with: ? ?E coli bacteremia: Secondary to UTI in the setting of prior Foley catheter for urinary retention.  No signs/symptoms currently for a complication related to recent surgery. ?Severe sepsis: Due to #1 and improved. ?Cervical spinal stenosis with cord compression s/p ACDF 06/29/21 complicated by epidural hematoma s/p evacuation 07/03/21.  Operative note reviewed with no concerns at that time for infection. ?Dysphagia: Complicated by episodes of aspiration pneumonia this admission. ? ?RECOMMENDATIONS:   ? ?Can narrow to cefazolin ?Plan for 7 days of therapy ?End date 08/25/21 ?Will sign off, please call as needed ? ? ?Principal Problem: ?  Cervical myelopathy (HCC); severe cervical spinal stenosis with cord compression s/p ACDF C3-7 on 2/3, complicated by epidural hematoma s/p evacuation 2/7 ?Active Problems: ?  Protein-calorie malnutrition, severe (HCC) ?  Acute metabolic encephalopathy ?  Tobacco use disorder ?  Physical deconditioning ?  Elevated liver enzymes ?  Hyponatremia ?  Sinus tachycardia ?  Severe sepsis due to recurrent aspiration pneumonia ?  Acute pulmonary embolism (HCC) ?  Acute respiratory failure with hypoxia (HCC) ?  Urinary retention ? ? ?MEDICATIONS:   ? ?Scheduled Meds: ? bethanechol  10 mg Per Tube TID  ? chlorhexidine  15 mL Mouth Rinse BID  ? Chlorhexidine Gluconate Cloth  6 each Topical Daily  ? enoxaparin (LOVENOX) injection  50 mg Subcutaneous BID  ? feeding supplement (PROSource TF)  45 mL Per Tube Daily  ? folic acid  1 mg Per Tube Daily  ? free water  100 mL Per Tube Q4H  ? mouth rinse  15 mL Mouth Rinse q12n4p  ? metoprolol tartrate  100 mg Per Tube BID  ? multivitamin with minerals  1 tablet Per Tube Daily  ? nicotine  14 mg Transdermal  Daily  ? QUEtiapine  12.5 mg Oral BID  ? thiamine  100 mg Per Tube Daily  ? ?Continuous Infusions: ? sodium chloride Stopped (08/20/21 1118)  ? cefTRIAXone (ROCEPHIN)  IV Stopped (08/20/21 1002)  ? feeding supplement (OSMOLITE 1.5 CAL) 1,000 mL (08/20/21 1118)  ? lactated ringers 100 mL/hr at 08/20/21 1118  ? ?PRN Meds:.acetaminophen (TYLENOL) oral liquid 160 mg/5 mL, acetaminophen, albuterol, bisacodyl, diphenhydrAMINE, sennosides **AND** docusate, haloperidol lactate, HYDROcodone-acetaminophen, HYDROmorphone (DILAUDID) injection, iohexol, ipratropium-albuterol, metoprolol tartrate, [DISCONTINUED] ondansetron **OR** ondansetron (ZOFRAN) IV, ondansetron (ZOFRAN) IV, sodium phosphate, white petrolatum ? ?HPI:   ? ?Brian [name] is a 61 y.o. male with a prolonged hospitalization after being admitted 06/29/2021 when he initially presented with falls and progressive upper and lower extremity weakness.  He was found to have critical multilevel cervical spinal stenosis at C3-6 with spinal cord compression.  He underwent ACDF with neurosurgery on 06/29/2021.  His postoperative course was complicated by epidural hematoma status post evacuation on 07/03/2021.  He has been hospitalized since that time with issues related to significant dysphagia and recurrent aspiration pneumonia with intubation 2/19 to 2/26.  He is currently not requiring supplemental oxygen and is stable on room air.  He was evaluated by SLP today who has recommended dysphagia 1 diet.  His hospital course further complicated by urinary retention and Foley catheter placement 3/4 through 3/16.  Patient developed episode of delirium and pulled out his G-tube on 3/23.  This coincided with fever and leukocytosis.  There was concern for UTI with urinalysis showing positive nitrites, pyuria, many bacteria.  Urine culture and blood cultures have grown E. coli.  Patient has been on ceftriaxone with improvement in his leukocytosis as well as improvement in his fever  curve.  It is felt that his Foley catheter may have been the precipitating factor for his development of UTI.  He has had no ongoing signs of retention after Foley catheter discontinuation on 3/16. ? ? ?History reviewed. No pertinent past medical history. ? ?Social History  ? ?Tobacco Use  ? Smoking status: Every Day  ?  Packs/day: 0.33  ?  Types: Cigarettes  ? Smokeless tobacco: Never  ?Vaping Use  ? Vaping Use: Never used  ?Substance Use Topics  ? Alcohol use: Not Currently  ?  Comment: beers on weekends only  ? Drug use: Yes  ?  Types: Marijuana  ?  Comment: only on weekends  ? ? ?History reviewed. No pertinent family history. ? ?No Known Allergies ? ?Review of Systems  ?Constitutional:  Negative for chills and fever.  ?Respiratory: Negative.    ?Cardiovascular: Negative.   ?Gastrointestinal: Negative.   ?Genitourinary:  Negative for dysuria, frequency and urgency.  ?Musculoskeletal:  Negative for neck pain.  ?Psychiatric/Behavioral:  Positive for memory loss.   ?All other systems reviewed and are negative. ? ?OBJECTIVE:  ? ?Blood pressure 113/81, pulse 81, temperature 98.6 ?F (37 ?C), temperature source Oral, resp. rate 17, height 5\' 10"  (1.778 m), weight 47.9 kg, SpO2 100 %. ?Body mass index is 15.15 kg/m?. ? ?Physical Exam ?Constitutional:   ?   General: He is not in acute distress. ?   Appearance: Normal appearance.  ?HENT:  ?   Head: Normocephalic and atraumatic.  ?Eyes:  ?   Extraocular Movements: Extraocular movements intact.  ?   Conjunctiva/sclera: Conjunctivae normal.  ?Cardiovascular:  ?   Rate and Rhythm: Normal rate and regular rhythm.  ?Pulmonary:  ?   Effort: Pulmonary effort is normal. No respiratory distress.  ?   Breath sounds: Normal breath sounds.  ?Abdominal:  ?   General: There is no distension.  ?   Palpations: Abdomen is soft.  ?   Tenderness: There is no abdominal tenderness.  ?Musculoskeletal:     ?   General: Normal range of motion.  ?   Cervical back: No rigidity.  ?Skin: ?   General:  Skin is warm and dry.  ?   Findings: No rash.  ?Neurological:  ?   General: No focal deficit present.  ?   Mental Status: He is alert and oriented to person, place, and time.  ?   Motor: Weakness present.  ?Psychiatric:     ?   Mood and Affect: Mood normal.     ?   Behavior: Behavior normal.  ? ? ? ?Lab Results: ?Lab Results  ?Component Value Date  ? WBC 7.7 08/20/2021  ? HGB 8.3 (L) 08/20/2021  ? HCT 25.0 (L) 08/20/2021  ? MCV 91.6 08/20/2021  ? PLT 392 08/20/2021  ?  ?Lab Results  ?Component Value Date  ? NA 136 08/20/2021  ? K 3.7 08/20/2021  ? CO2 25 08/20/2021  ? GLUCOSE 115 (H) 08/20/2021  ? BUN 11 08/20/2021  ? CREATININE 0.58 (L) 08/20/2021  ? CALCIUM 9.6 08/20/2021  ? GFRNONAA >60 08/20/2021  ?  ?Lab Results  ?Component Value Date  ? ALT 52 (H) 08/20/2021  ? AST 45 (H) 08/20/2021  ?  GGT 310 (H) 07/25/2021  ? ALKPHOS 143 (H) 08/20/2021  ? BILITOT 0.3 08/20/2021  ? ? ?No results found for: CRP ? ?No results found for: ESRSEDRATE ? ?I have reviewed the micro and lab results in Epic. ? ?Imaging: ?No results found.  ? ?Imaging independently reviewed in Epic. ? ?Mignon Pine ?[facility] ?[place] ?[phone] pager ?08/20/2021, 12:22 PM ? ?I have personally spent 80 minutes involved in face-to-face and non-face-to-face activities for this patient on the day of the visit. Professional time spent includes the following activities: Preparing to see the patient (review of tests), Obtaining and/or reviewing separately obtained history (admission/discharge record), Performing a medically appropriate examination and/or evaluation , Ordering medications/tests/procedures, referring and communicating with other health care professionals, Documenting clinical information in the EMR, Independently interpreting results (not separately reported), Communicating results to the patient/family/caregiver, Counseling and educating the patient/family/caregiver and Care coordination  (not separately reported).  ? ? ?

## 2021-08-20 NOTE — Progress Notes (Signed)
Speech Language Pathology Treatment: Dysphagia  ?Patient Details ?Name: Brian [name] ?MRN: [id number] ?DOB: [birth year] ?Today's Date: 08/20/2021 ?Time: 3474-2595 ?SLP Time Calculation (min) (ACUTE ONLY): 14 min ? ?Assessment / Plan / Recommendation ?Clinical Impression ? Pt adequately alert and cognitively appropriate this morning. Reviewed MBS done last Thursday, delirium due to UTI and was made NPO. He consumed, with hand over hand eventually needing full assist, tsp and cup sips nectar thick juice. Evidence of residue with audible and multiple swallows. No coughing or throat clearing noted with clear vocal quality. He appears able to start nectar thick liquids and therapist will downgrade to puree for increased safety and progress to Dys 2 when able. Continue meds in PEG, will need full assist with meals, no straws, and small sips and bites. ST to follow.   ?HPI HPI: Pt is a 61 y/o admitted 06/29/21 following C3-7 ACDF after progressive cervical myelopathy symptoms with subsequent fall and severe incomplete SCI. Significant prevertebral swelling per imaging. Pt developed some difficulty swallowing on 2/5 and SLP consulted. MBS 2/6 revealed severe edema s/p ACDF, oral holding, a pharyngeal delay, inconsistent hyolaryngeal elevation, incomplete epiglottic inversion due to edema. Aspiration noted accross consistencies and and NPO status was recommended. Repeat MBS 2/10 showed significantly reduced pharyngeal edema and a dysphagia 2 diet with nectar thick liquids was initiated. Pt transitioned to thin liquids 2/16 and susbequently returned to nectar thick 2/17 due to concern for aspiration. Pt developed pna, possible aspiration, requiring intubation 2/19-26. Cortrak placed 2/20. PEG placed in IR on 3/6. No significant PMH. This is pt's 3rd MBS ?  ?   ?SLP Plan ? Continue with current plan of care ? ?  ?  ?Recommendations for follow up therapy are one component of a multi-disciplinary discharge planning process, led  by the attending physician.  Recommendations may be updated based on patient status, additional functional criteria and insurance authorization. ?  ? ?Recommendations  ?Diet recommendations: Dysphagia 1 (puree);Nectar-thick liquid ?Liquids provided via: Cup;No straw ?Medication Administration: Via alternative means ?Supervision: Staff to assist with self feeding;Full supervision/cueing for compensatory strategies ?Compensations: Slow rate;Small sips/bites;Clear throat intermittently ?Postural Changes and/or Swallow Maneuvers: Seated upright 90 degrees  ?   ?    ?   ? ? ? ? Oral Care Recommendations: Oral care BID ?Follow Up Recommendations: Skilled nursing-short term rehab (<3 hours/day) ?Assistance recommended at discharge: Frequent or constant Supervision/Assistance ?SLP Visit Diagnosis: Dysphagia, oropharyngeal phase (R13.12) ?Plan: Continue with current plan of care ? ? ? ? ?  ?  ? ? ?[name] ? ?08/20/2021, 10:15 AM ?

## 2021-08-20 NOTE — Progress Notes (Signed)
? ? ? [facility] ?                                                                            ? ? ?Brian [name], is a 61 y.o. male, DOB - Feb 03, 1961, [id number] ?Admit date - 06/29/2021    ?Outpatient Primary MD for the patient is Pcp, No ? ?LOS - 52  days ? ? ? ?Brief summary  ? 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  He was admitted on 2/3 and underwent ACDF of C3-4, C4-5, C5-5, and C6-7.  Post-operatively he was progressing slowly with residual weakness in both hands but improving lower extremity strength and function.  He developed some difficulty swallowing on 2/5 and SLP ordered and placed on thickened liquids.   Overnight 2/6, patient with increased difficulty swallowing and was made NPO but also noted to have dysarthria and difficulty moving extremities with numbness.  SLP evaluated and found to be moderate aspiration risk.   Also progressively tachycardic, tachypneic, and now febrile.  Code stroke activated and taken for CT/ CTA head and neck and was given decadron 10mg  once.  NIHSS 18.  CTH was negative for acute findings, and CTA head neck did not reveal any LVO but noted significant prevertebral soft tissue swelling with foci of gas present at the ventral epidural space at C4-5, small collection not excluded. On 2/7, patient with worsening confusion and concern for airway involvement, PCCM consulted and remained in the intensive care unit until he is transferred to the floor on 3/2 and PCCM requested transition to [facility] for medical assistance while patient remains under the neurosurgery service. ? ?Significant Hospital Events: ?2/3 ACDF C3-4, C4-5, C5-5, and C6-7  w/ Dr. Annette Stable ?2/6 SLP eval for difficulty swallowing ?2/7 Code stroke overnight, neg for LVO, CT showing soft tissue swelling.  PCCM consulted for concern of airway management and AMS.  Went for evacuation of hematoma  ?2/18 CT Abd/Pelvis: showing bilateral segmental Pes, started on heparin and showing worsening pneumonia, febrile to  103.  PCT rising, 34.6, restarted on abx ?2/19 possible aspiration w/ severe hypoxia; intubated; Switching from heparin to angiomax given subtherapeutic levels despite increase in rate. ?2/20 Echo shows normal LV systolic function. There was notation of a + McConnell's sign c/w a large pulmonary embolus which is consistent with the finding of bilateral segmental pulmonary emboli noted on CT 07/14/2021. ?2/21 rash appreciated on back; stopped cefepim/vanc switched to zosyn; required low dose levo with increase in fentanyl ?2/22 remains intubated; unable to extubate to do increase RR and thick secretions ?2/23 [name] infusion added ?2/24: weaning fentanyl.  ?2/26 extubated ?2/28 tachycardia persists ?3/2: Modified barium swallow, continues n.p.o. with core track in place, likely will need PEG tube. ?3/6: s/p IR G-tube placement ?3/23: patient pulled out G tube, became delirious possibly secondary to UTI.  ?3/24: replacement of the G tube and IV antibiotics started for UTI.  ?3/25 sepsis  from E coli bacteremia from UTI.  ? ? ? ?Assessment & Plan  ? ? ?Assessment and Plan: ?* Cervical myelopathy ([name]); severe cervical spinal stenosis with cord compression s/p ACDF [id number] on 2/3, complicated by epidural hematoma s/p evacuation 2/7 ?Patient initially presented s/p recent fall with progressive upper and lower extremity weakness and tremors, He was found to have critical multilevel cervical spinal stenosis @ C3-3 6 with spinal cord compression and spinal cord signal change and underwent ACDF C3-4, C4-5, C5-5, and C6/7 by neurosurgery Dr. Trenton Gammon on 06/29/2021.  Postoperative leg complicated by  epidural hematoma s/p evacuation on 2/7. ?Recommendations  as per Neuro surgery.  ?Therapy eval recommending SNF.  ?Toc on board. ? ?Acute respiratory failure with hypoxia ([place]) ?Likely Multifactorial with significant dysphagia and recurrent aspiration pneumonia events during initial hospitalization following ACDF surgery with postoperative complications of postoperative cervical hematoma.  Patient did require ventilatory support in the intensive care unit and was successfully extubated on 07/22/2021.  Patient completed extensive course of empiric antibiotics.   ?Weaned off oxygen and stable on RA.  ? ? ?Acute pulmonary embolism ([place]) ?CTA chest showed bilateral segmental PE.  LE [place] negative for DVT.   ?Echocardiogram does not show any right heart strain.  ?Transitioned to eliquis for anti coagulation.  ? ?Acute metabolic encephalopathy ?CT head, MRI brain, EEG, TSH, B12, ammonia unrevealing.   ?Etiology likely secondary to  e coli bacteremia and UTI.  ?He is alert and answering simple questions.  ? ? ? ?Severe sepsis due to recurrent aspiration pneumonia ?And E coli bacteremia from UTI on 3/24 ?Started the patient on IV rocephin 2 g IV daily,. Reviewed the sensitivities .  ?Follow the blood and urine cultures .  ?Lactic acid wnl.  ?Follow pro calcitonin. Improving procalcitonin.  ?Keep MAP >65 mmhg.  ?Continue with IV fluids.  ?ID consulted and plan for 7 days of treatment.  ? ?Sinus tachycardia ?Multifactorial including sepsis, dehydration, PE, agitation.   ?EKG shows sinus tachycardia, improved with IV fluids.  ?On metoprolol.  ? ? ?Elevated liver enzymes ?Etiology likely secondary to sepsis from aspiration pneumonia as above.   ?Improving.  ?Acute hepatitis panel is negative.  ?RUQ IS unremarkable.  ?Recheck liver panel in 4 weeks.  ? ?Hyponatremia ?Resolved.  ? ?Tobacco use disorder ?Encouraged cessation. ?--Nicotine patch; decreased to 7 mg on 3/15 ? ?Physical deconditioning ?Significant weakness in all  extremities as a result of cervical myelopathy and acute illness.  Slowly improving while inpatient. ?Therapy eval recommending SNF.  ?TOC on board .  ? ?Urinary retention ?Foley catheter discontinued on 08/09/2021 with no current signs of urinary retention. ?--Continue bethanechol. ?-- ? Suspect leading to UTI and e coli bacteremia. .  ?  ? ? ?Protein-calorie malnutrition, severe ([place]) ?As evidenced by poor p.o. intake, significant muscle mass and subcu fat loss and significant weight loss (about 16 pounds since this hospitalization). ?Nutrition Problem: Severe Malnutrition (in the context of social/environmental circumstances) ?Etiology:  (inadequate energy intake) ?Signs/Symptoms: mild fat depletion, severe muscle depletion, severe muscle depletion ?Interventions: Refer to RD note for recommendations  ?--Dietitian following, appreciate assistance.   ?--s/p IR gtube 3/6. Pt pulled it out on 3/23 and it was replaced on 3/24.  ?- tube feeds to be restarted.  ? ?Hyperglycemia ?Probably secondary to steroids.  ?A1c is 5.2% ? ? ?Anemia of chronic disease ?Continue to monitor. ?Transfuse to keep hemoglobin greater than 7  ? ? ? ?Thrombocytosis:  ?Probably reactive.  ?Resolved.  ? ?  ? ? ? ?  ? ?  Malnutrition Type: ? ?Nutrition Problem: Severe Malnutrition ?Etiology: social / environmental circumstances ? ? ?Malnutrition Characteristics: ? ?Signs/Symptoms: severe muscle depletion, severe fat depletion ? ? ?Nutrition Interventions: ? ?Interventions: Tube feeding, Prostat, MVI ? ?Estimated body mass index is 15.15 kg/m? as calculated from the following: ?  Height as of this encounter: 5\' 10"  (1.778 m). ?  Weight as of this encounter: 47.9 kg. ? ?Code Status: full code.  ?DVT Prophylaxis:  SCD's Start: 06/29/21 1231 ? ? ?Level of Care: Level of care: Progressive transition to med surg today if okay with primary.  ? ? ? ? ?Antimicrobials:  ? ?Anti-infectives (From admission, onward)  ? ? Start     Dose/Rate Route Frequency  Ordered Stop  ? 08/21/21 0600  ceFAZolin (ANCEF) IVPB 2g/100 mL premix       ? 2 g ?200 mL/hr over 30 Minutes Intravenous Every 8 hours 08/20/21 1243 08/26/21 0559  ? 08/19/21 0000  cefTRIAXone (ROCEPHIN) 2 g in so

## 2021-08-20 NOTE — Progress Notes (Addendum)
? ?  Palliative Medicine Inpatient Follow Up Note ? ?HPI: ?61 y.o. 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 ?  ?Today is day 59 of hospitalization. ? ?Palliative care was asked to get involved to further address goals of care in the setting of clinical decline/AMS.  ? ?Today's Discussion (08/20/2021): ? ?*Please note that this is a verbal dictation therefore any spelling or grammatical errors are due to the "[place] One" system interpretation. ? ?Chart reviewed inclusive of vital signs, progress notes, laboratory results, and diagnostic images.  ? ?I met with Mr. Westrup at bedside this morning. He is in good spirits and asks about the weather. He shares that he is feeling great today. He is reoriented to why the palliative care team has become involved in his care.  ? ?Long term placement is pending based upon approval of medicaid.  ? ?Objective Assessment: ?Vital Signs ?Vitals:  ? 08/20/21 0356 08/20/21 0823  ?BP: 124/86 121/90  ?Pulse: 92 91  ?Resp: 16 19  ?Temp: 98.3 ?F (36.8 ?C) 99.1 ?F (37.3 ?C)  ?SpO2: 100% 100%  ? ? ?Intake/Output Summary (Last 24 hours) at 08/20/2021 1124 ?Last data filed at 08/20/2021 0930 ?Gross per 24 hour  ?Intake 2306.01 ml  ?Output 1500 ml  ?Net 806.01 ml  ? ? ?Last Weight  Most recent update: 08/18/2021  4:12 AM  ? ? Weight  ?47.9 kg (105 lb 9.6 oz)  ?      ? ?  ? ?Gen:  Frail AA M in NAD ?HEENT: Dry mucous membranes ?CV: Regular rate and rhythm ?PULM: On RA ?ABD: soft/nontender ?EXT: No edema ?Neuro: Alert and oriented x3 ? ?SUMMARY OF RECOMMENDATIONS   ?Full Code/ Full Scope of Care ? ?Improvements have been made in the setting of tx of UTI ? ?Patient no longer requires restraints ? ?[name]  dietician has reached out to patients son, [name] tomorrow per his prior request ? ?Long term placement pending in the setting of medicaid approval ? ?Per discussion with Primary team we will sign off at this point ? ?MDM - Moderate ? ?_____________________________________________________________________________________ ?Tacey Ruiz ?[facility] Team ?Team Cell Phone: [phone] ?Please utilize secure chat with additional questions, if there is no response within 30 minutes please call the above phone number ? ?Palliative Medicine Team providers are available by phone from 7am to 7pm daily and can be reached through the team cell phone.  ?Should this patient require assistance outside of these hours, please call the patient's attending physician. ? ? ? ? ?

## 2021-08-20 NOTE — Progress Notes (Signed)
Physical Therapy Treatment ?Patient Details ?Name: Brian [name] ?MRN: [id number] ?DOB: [birth year] ?Today's Date: 08/20/2021 ? ? ?History of Present Illness Pt is a 61 y/o male admitted 06/29/21 following C3-7 ACDF after progressive cervical myelopathy symptoms including ataxia, bil UE and LE shaking and weakness, and loss of function in his hands with subsequent fall. Pt initially had improvement in neurological symptoms, but postoperative course was complicated by dysphasia and ataxia secondary to and epidural hematoma with evacuation of hematoma on 2/7. Course complicated by delirium secondary to encephalopathy, sepsis secondary to aspiration PNA, reintubated 2/19-2/26 for airway protection, bilat segmental PEs 2/18. PEG placed 3/6. PMHx includes tobacco and EtOH use disorder. ? ?  ?PT Comments  ? ? Pt more talkative today however perseverative on getting steel toe boots to "help me walk". Attempted gait progression however pt unable to hold onto walker, significant L lateral lean, and scissor gait pattern limiting distance despite having 3 people to assist. Worked on foot placement in sitting to prep for future gait progression. Acute PT to cont to follow. ?  ?Recommendations for follow up therapy are one component of a multi-disciplinary discharge planning process, led by the attending physician.  Recommendations may be updated based on patient status, additional functional criteria and insurance authorization. ? ?Follow Up Recommendations ? Skilled nursing-short term rehab (<3 hours/day) ?  ?  ?Assistance Recommended at Discharge Frequent or constant Supervision/Assistance  ?Patient can return home with the following Two people to help with walking and/or transfers;A lot of help with bathing/dressing/bathroom;Assistance with cooking/housework;Assistance with feeding;Direct supervision/assist for medications management;Direct supervision/assist for financial management;Assist for transportation;Help with stairs  or ramp for entrance ?  ?Equipment Recommendations ? Wheelchair (measurements PT);Wheelchair cushion (measurements PT);Hospital bed  ?  ?Recommendations for Other Services   ? ? ?  ?Precautions / Restrictions Precautions ?Precautions: Fall ?Required Braces or Orthoses: Cervical Brace ?Cervical Brace:  (now d/c'd) ?Restrictions ?Weight Bearing Restrictions: No  ?  ? ?Mobility ? Bed Mobility ?Overal bed mobility: Needs Assistance ?Bed Mobility: Rolling, Sidelying to Sit ?Rolling: Min assist ?Sidelying to sit: Mod assist ?  ?  ?  ?General bed mobility comments: pt initiated transfer to EOB however trying to get up on edge with bed rail up, pt reached for PTs hand to pull self up ?  ? ?Transfers ?Overall transfer level: Needs assistance ?Equipment used: 2 person hand held assist ?Transfers: Bed to chair/wheelchair/BSC, Sit to/from Stand ?Sit to Stand: +2 physical assistance, Mod assist ?  ?Step pivot transfers: Mod assist, +2 physical assistance ?  ?  ?  ?General transfer comment: pt requiring max directional verbal cues. Pt with good advancement of R LE to step out towards chair, pt with more difficulty with L LE advancement requiring maxA ?  ? ?Ambulation/Gait ?Ambulation/Gait assistance: +2 physical assistance, Max assist ?Gait Distance (Feet): 2 Feet ?Assistive device: Rolling walker (2 wheels) ?Gait Pattern/deviations: Decreased stride length, Step-to pattern, Ataxic, Decreased dorsiflexion - left, Decreased dorsiflexion - right, Scissoring ?Gait velocity: Decreased ?  ?  ?General Gait Details: attempted to take steps however pt with significant L lateral lean and ataxic gait pattern in which R foot would crossover the L. despite max tactile cues to the R foot and PT tech on the L pt unable to sequence steps safely today. ? ? ?Stairs ?  ?  ?  ?  ?  ? ? ?Wheelchair Mobility ?  ? ?Modified Rankin (Stroke Patients Only) ?  ? ? ?  ?Balance Overall balance assessment: Needs assistance ?  Sitting-balance support: Single  extremity supported, Feet supported ?Sitting balance-Leahy Scale: Poor ?Sitting balance - Comments: sitting on EOB with rail for support ?Postural control: Posterior lean ?Standing balance support: Bilateral upper extremity supported ?Standing balance-Leahy Scale: Poor ?Standing balance comment: reliant on bilat HHA or RW ?  ?  ?  ?  ?  ?  ?  ?  ?  ?  ?  ?  ? ?  ?Cognition Arousal/Alertness: Awake/alert ?Behavior During Therapy: [facility] for tasks assessed/performed ?Overall Cognitive Status: No family/caregiver present to determine baseline cognitive functioning ?Area of Impairment: Problem solving, Awareness ?  ?  ?  ?  ?  ?  ?  ?  ?Orientation Level: Disoriented to, Time, Situation ?Current Attention Level: Sustained ?Memory: Decreased short-term memory, Decreased recall of precautions ?Following Commands: Follows one step commands with increased time ?Safety/Judgement: Decreased awareness of safety, Decreased awareness of deficits ?Awareness: Intellectual ?Problem Solving: Requires verbal cues, Requires tactile cues, Difficulty sequencing ?General Comments: pt talking about getting steal toe boots so he can walker better ?  ?  ? ?  ?Exercises Other Exercises ?Other Exercises: worked in sitting on pickup up foot and placing it back down where it was/minimizing adduction ? ?  ?General Comments General comments (skin integrity, edema, etc.): pt perseverating on getting steel toed boots because that will help him walk better ?  ?  ? ?Pertinent Vitals/Pain Pain Assessment ?Pain Assessment: No/denies pain  ? ? ?Home Living   ?  ?  ?  ?  ?  ?  ?  ?  ?  ?   ?  ?Prior Function    ?  ?  ?   ? ?PT Goals (current goals can now be found in the care plan section) Acute Rehab PT Goals ?PT Goal Formulation: With patient ?Time For Goal Achievement: 09/04/21 ?Potential to Achieve Goals: Fair ?Progress towards PT goals: Progressing toward goals ? ?  ?Frequency ? ? ? Min 2X/week ? ? ? ?  ?PT Plan Current plan remains appropriate   ? ? ?Co-evaluation   ?  ?  ?  ?  ? ?  ?AM-PAC PT "6 Clicks" Mobility   ?Outcome Measure ? Help needed turning from your back to your side while in a flat bed without using bedrails?: A Little ?Help needed moving from lying on your back to sitting on the side of a flat bed without using bedrails?: A Little ?Help needed moving to and from a bed to a chair (including a wheelchair)?: A Lot ?Help needed standing up from a chair using your arms (e.g., wheelchair or bedside chair)?: A Lot ?Help needed to walk in hospital room?: Total ?Help needed climbing 3-5 steps with a railing? : Total ?6 Click Score: 12 ? ?  ?End of Session Equipment Utilized During Treatment: Gait belt ?Activity Tolerance: Patient tolerated treatment well ?Patient left: with call bell/phone within reach;with chair alarm set;in chair ?Nurse Communication: Mobility status ?PT Visit Diagnosis: Muscle weakness (generalized) (M62.81);Other symptoms and signs involving the nervous system (R29.898);Difficulty in walking, not elsewhere classified (R26.2);Other abnormalities of gait and mobility (R26.89) ?  ? ? ?Time: [id number] ?PT Time Calculation (min) (ACUTE ONLY): 30 min ? ?Charges:  $Gait Training: 8-22 mins ?$Neuromuscular Re-education: 8-22 mins          ?          ? ?Kittie Plater, PT, DPT ?Acute Rehabilitation Services ?Pager #: [phone] ?Office #: [phone] ? ? ? ?Justen Fonda M Braidyn Peace ?08/20/2021, 12:26 PM ? ?

## 2021-08-20 NOTE — Progress Notes (Signed)
? ?  Providing Compassionate, Quality Care - Together ? ? ?Subjective: ?Patient appears much more lucid today. He is up to the chair during this assessment. ? ?Objective: ?Vital signs in last 24 hours: ?Temp:  [98.3 ?F (36.8 ?C)-99.1 ?F (37.3 ?C)] 98.6 ?F (37 ?C) (03/27 1209) ?Pulse Rate:  [81-92] 81 (03/27 1209) ?Resp:  [15-19] 17 (03/27 1209) ?BP: (113-124)/(80-90) 113/81 (03/27 1209) ?SpO2:  [100 %] 100 % (03/27 1209) ? ?Intake/Output from previous day: ?03/26 0701 - 03/27 0700 ?In: 1896 [P.O.:80; I.V.:1100; NG/GT:716] ?Out: 2500 [Urine:2500] ?Intake/Output this shift: ?Total I/O ?In: 1465.4 [I.V.:362.7; Other:100; NG/GT:906; IV Piggyback:96.7] ?Out: 400 [Urine:400] ? ?Alert, calm ?Oriented to self ?MAE, LLE >RLE ?Decreased fine motor BUE ?Incision is healing well ? ?Lab Results: ?Recent Labs  ?  08/19/21 ?0813 08/20/21 ?[id number]  ?WBC 10.7* 7.7  ?HGB 8.7* 8.3*  ?HCT 25.9* 25.0*  ?PLT 388 392  ? ?BMET ?Recent Labs  ?  08/19/21 ?0813 08/20/21 ?[id number]  ?NA 133* 136  ?K 3.9 3.7  ?CL 102 105  ?CO2 24 25  ?GLUCOSE 119* 115*  ?BUN 12 11  ?CREATININE 0.61 0.58*  ?CALCIUM 9.4 9.6  ? ? ?Studies/Results: ?No results found. ? ?Assessment/Plan: ?Patient status post C3-4, C4-5, C5-6, C6-7 anterior cervical discectomy with interbody fusion by Dr. Annette Stable on 06/29/2021. Increased difficulty swallowing with lung atelectasis and elevated temperatures on 07/02/2021. Made NPO following MBS by SLP. 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 CTA head and neck negative for stroke. MRI revealed epidural hematoma. Patient underwent exploration of his cervical fusion with evacuation of the epidural hematoma on 07/03/2021. Cortrak was placed on 07/04/2021. Patient's strength much improved since epidural hematoma evacuation. Cortrak removed 07/06/2021 and dysphagia 2 diet with nectar thick liquids started. Patient with delirium vs ETOH withdrawal. Discontinued steroids 07/06/2021. CIWA protocol started and  discontinued 07/07/2021. CT and MRI 07/07/2021 negative. Patient developed respiratory distress, requiring intubation on 07/15/2021. Work up revealed aspiration PNA and PE. Patient was extubated on 07/22/2021. Foley catheter removed 07/24/2021. Patient transferred to 3W on 07/26/2021. Foley replaced 07/28/2021. PEG placed 07/30/2021. Foley removed 08/09/2021. PEG pulled out by patient 08/16/2021. Delirium worsening 08/17/2021. Haldol administered. IR replaced PEG on 08/17/2021. Patient with urosepsis 08/18/2021. Culture grew E. Coli. ? ? LOS: 52 days  ? ?-Continue efforts at rehabilitation. ?-Goal is for SNF placement. ?  ? ? ?Viona Gilmore, DNP, AGNP-C ?Nurse Practitioner ? ?[facility] ?[address], [address], [place], [place] [address] ?[id number]    [id number] ? ?08/20/2021, 12:42 PM ? ? ? ? ?

## 2021-08-21 DIAGNOSIS — G959 Disease of spinal cord, unspecified: Secondary | ICD-10-CM | POA: Diagnosis not present

## 2021-08-21 DIAGNOSIS — G9341 Metabolic encephalopathy: Secondary | ICD-10-CM | POA: Diagnosis not present

## 2021-08-21 DIAGNOSIS — I2699 Other pulmonary embolism without acute cor pulmonale: Secondary | ICD-10-CM | POA: Diagnosis not present

## 2021-08-21 DIAGNOSIS — R7989 Other specified abnormal findings of blood chemistry: Secondary | ICD-10-CM

## 2021-08-21 LAB — GLUCOSE, CAPILLARY
Glucose-Capillary: 104 mg/dL — ABNORMAL HIGH (ref 70–99)
Glucose-Capillary: 105 mg/dL — ABNORMAL HIGH (ref 70–99)
Glucose-Capillary: 108 mg/dL — ABNORMAL HIGH (ref 70–99)
Glucose-Capillary: 111 mg/dL — ABNORMAL HIGH (ref 70–99)
Glucose-Capillary: 94 mg/dL (ref 70–99)
Glucose-Capillary: 95 mg/dL (ref 70–99)

## 2021-08-21 MED ORDER — APIXABAN 5 MG PO TABS
5.0000 mg | ORAL_TABLET | Freq: Two times a day (BID) | ORAL | Status: DC
  Administered 2021-08-21 – 2021-11-02 (×145): 5 mg via ORAL
  Filled 2021-08-21 (×146): qty 1

## 2021-08-21 NOTE — Progress Notes (Signed)
Speech Language Pathology Treatment: Dysphagia  ?Patient Details ?Name: Brian [name] ?MRN: [id number] ?DOB: 03-29-1961 ?Today's Date: 08/21/2021 ?Time: 9390-3009 ?SLP Time Calculation (min) (ACUTE ONLY): 16 min ? ?Assessment / Plan / Recommendation ?Clinical Impression ? Brian [name] was alert, pleasant and lucid reporting his dislike of the puree texture. He was observed with trial texture of Dys 3 for possible upgrade. His mastication was adequate without oral residue and swallow/respiration appeared coordinated without signs of airway intrusion. Multiple swallows present with straw sips thin and pt with known mild pharyngeal residue. Therapist upgraded to Dys 3 texture, continue thin liquids, pills in PEG. Continue ST.    ?  ?HPI HPI: Pt is a 61 y/o admitted 06/29/21 following C3-7 ACDF after progressive cervical myelopathy symptoms with subsequent fall and severe incomplete SCI. Significant prevertebral swelling per imaging. Pt developed some difficulty swallowing on 2/5 and SLP consulted. MBS 2/6 revealed severe edema s/p ACDF, oral holding, a pharyngeal delay, inconsistent hyolaryngeal elevation, incomplete epiglottic inversion due to edema. Aspiration noted accross consistencies and and NPO status was recommended. Repeat MBS 2/10 showed significantly reduced pharyngeal edema and a dysphagia 2 diet with nectar thick liquids was initiated. Pt transitioned to thin liquids 2/16 and susbequently returned to nectar thick 2/17 due to concern for aspiration. Pt developed pna, possible aspiration, requiring intubation 2/19-26. Cortrak placed 2/20. PEG placed in IR on 3/6. No significant PMH. This is pt's 3rd MBS ?  ?   ?SLP Plan ? Continue with current plan of care ? ?  ?  ?Recommendations for follow up therapy are one component of a multi-disciplinary discharge planning process, led by the attending physician.  Recommendations may be updated based on patient status, additional functional criteria and insurance  authorization. ?  ? ?Recommendations  ?Diet recommendations: Dysphagia 3 (mechanical soft);Thin liquid ?Liquids provided via: Cup;Straw ?Medication Administration: Via alternative means ?Supervision: Staff to assist with self feeding;Full supervision/cueing for compensatory strategies ?Compensations: Slow rate;Small sips/bites;Clear throat intermittently ?Postural Changes and/or Swallow Maneuvers: Seated upright 90 degrees  ?   ?    ?   ? ? ? ? Oral Care Recommendations: Oral care BID ?Follow Up Recommendations: Skilled nursing-short term rehab (<3 hours/day) ?Assistance recommended at discharge: Frequent or constant Supervision/Assistance ?SLP Visit Diagnosis: Dysphagia, oropharyngeal phase (R13.12) ?Plan: Continue with current plan of care ? ? ? ? ?  ?  ? ? ?Brian [name] ? ?08/21/2021, 2:34 PM ?

## 2021-08-21 NOTE — Progress Notes (Signed)
? ? ? [facility] ?                                                                            ? ? ?Brian [name], is a 61 y.o. male, DOB - May 20, 1961, [id number] ?Admit date - 06/29/2021    ?Outpatient Primary MD for the patient is Pcp, No ? ?LOS - 53  days ? ? ? ?Brief summary  ? ?ALDRIDGE GLEDHILL is a 61 year old male with past medical history significant for tobacco and EtOH use disorder who was admitted by neurosurgery for cervical myelopathy due to critical multilevel cervical spinal canal stenosis C3-6 with spinal cord compression and spinal cord signal change after recent fall with progressive upper and lower extremity weakness and tremors.  He was admitted on 2/3 and underwent ACDF of C3-4, C4-5, C5-5, and C6-7.  Post-operatively he was progressing slowly with residual weakness in both hands but improving lower extremity strength and function.  He developed some difficulty swallowing on 2/5 and SLP ordered and placed on thickened liquids.   Overnight 2/6, patient with increased difficulty swallowing and was made NPO but also noted to have dysarthria and difficulty moving extremities with numbness.  SLP evaluated and found to be moderate aspiration risk.   Also progressively tachycardic, tachypneic, and now febrile.  Code stroke activated and taken for CT/ CTA head and neck and was given decadron 10mg  once.  NIHSS 18.  CTH was negative for acute findings, and CTA head neck did not reveal any LVO but noted significant prevertebral soft tissue swelling with foci of gas present at the ventral epidural space at C4-5, small collection not excluded. On 2/7, patient with worsening confusion and concern for airway involvement, PCCM consulted and remained in the intensive care unit until he is transferred to the floor on 3/2 and PCCM requested transition to [facility] for medical assistance while patient remains under the neurosurgery service. ? ?Significant Hospital Events: ?2/3 ACDF C3-4, C4-5, C5-5, and C6-7  w/ Dr. Annette Stable ?2/6 SLP eval for difficulty swallowing ?2/7 Code stroke overnight, neg for LVO, CT showing soft tissue swelling.  PCCM consulted for concern of airway management and AMS.  Went for evacuation of hematoma  ?2/18 CT Abd/Pelvis: showing bilateral segmental Pes, started on heparin and showing worsening pneumonia, febrile to  103.  PCT rising, 34.6, restarted on abx ?2/19 possible aspiration w/ severe hypoxia; intubated; Switching from heparin to angiomax given subtherapeutic levels despite increase in rate. ?2/20 Echo shows normal LV systolic function. There was notation of a + McConnell's sign c/w a large pulmonary embolus which is consistent with the finding of bilateral segmental pulmonary emboli noted on CT 07/14/2021. ?2/21 rash appreciated on back; stopped cefepim/vanc switched to zosyn; required low dose levo with increase in fentanyl ?2/22 remains intubated; unable to extubate to do increase RR and thick secretions ?2/23 [name] infusion added ?2/24: weaning fentanyl.  ?2/26 extubated ?2/28 tachycardia persists ?3/2: Modified barium swallow, continues n.p.o. with core track in place, likely will need PEG tube. ?3/6: s/p IR G-tube placement ?3/23: patient pulled out G tube, became delirious possibly secondary to UTI.  ?3/24: replacement of the G tube and IV antibiotics started for UTI.  ?3/25 sepsis  from E coli bacteremia from UTI.  ? ? ? ?Assessment & Plan  ? ? ?Assessment and Plan: ?* Cervical myelopathy ([place]); severe cervical spinal stenosis with cord compression s/p ACDF [id number] on 2/3, complicated by epidural hematoma s/p evacuation 2/7 ?Patient initially presented s/p recent fall with progressive upper and lower extremity weakness and tremors, He was found to have critical multilevel cervical spinal stenosis @ C3-3 6 with spinal cord compression and spinal cord signal change and underwent ACDF C3-4, C4-5, C5-5, and C6/7 by neurosurgery Dr. Trenton Gammon on 06/29/2021.  Postoperative leg complicated by  epidural hematoma s/p evacuation on 2/7. ?Recommendations  as per Neuro surgery.  ?Therapy eval recommending SNF.  ?Toc on board. ? ?Acute respiratory failure with hypoxia ([place]) ?Likely Multifactorial with significant dysphagia and recurrent aspiration pneumonia events during initial hospitalization following ACDF surgery with postoperative complications of postoperative cervical hematoma.  Patient did require ventilatory support in the intensive care unit and was successfully extubated on 07/22/2021.  Patient completed extensive course of empiric antibiotics.   ?Weaned off oxygen and stable on RA.  ? ? ?Acute pulmonary embolism ([place]) ?CTA chest showed bilateral segmental PE.  LE [place] negative for DVT.   ?Echocardiogram does not show any right heart strain.  ?Transitioned to eliquis for anti coagulation.  ? ?Acute metabolic encephalopathy ?CT head, MRI brain, EEG, TSH, B12, ammonia unrevealing.   ?Etiology likely secondary to  e coli bacteremia and UTI.  ?He is alert and answering simple questions.  ? ? ?Severe sepsis due to recurrent aspiration pneumonia ?And E coli bacteremia from UTI on 3/24 ?Started the patient on IV rocephin 2 g IV daily,. Reviewed the sensitivities . ID consulted by neuro surgery.  ?ID recommended 7 days of IV antibiotics.  ?Blood and urine cultures show E coli .  ?Lactic acid wnl. Improving procalcitonin.  ?Keep MAP >65 mmhg.  ?ID consulted and plan for 7 days of treatment.  ? ?Sinus tachycardia ?Multifactorial including sepsis, dehydration, PE, agitation.   ?EKG shows sinus tachycardia, improved with IV fluids.  ?On metoprolol.  ? ? ?Elevated liver enzymes ?Etiology likely secondary to sepsis from aspiration pneumonia as above.   ?Improving.  ?Acute hepatitis panel is negative.  ?RUQ IS unremarkable.  ?Recheck liver panel in 4 weeks.  ? ?Hyponatremia ?Resolved.  ? ?Tobacco use disorder ?Encouraged cessation. ?--Nicotine patch; decreased to 7 mg on 3/15 ? ?Physical deconditioning ?Significant  weakness in all extremities as a result of cervical myelopathy and acute illness.  Slowly improving while inpatient. ?Therapy eval recommending SNF.  ?TOC on board .  ? ?Urinary retention ?Foley catheter discontinued on 08/09/2021 with no current signs of urinary retention. ?--Continue bethanechol. ?-- ? Suspect leading to UTI and e coli bacteremia. .  ?  ? ? ?Protein-calorie malnutrition, severe ([place]) ?As evidenced by poor p.o. intake, significant muscle mass and subcu fat loss and significant weight loss (about 16 pounds since this hospitalization). ?Nutrition Problem: Severe Malnutrition (in the context of social/environmental circumstances) ?Etiology:  (inadequate energy intake) ?Signs/Symptoms: mild fat depletion, severe muscle depletion, severe muscle depletion ?Interventions: Refer to RD note for recommendations  ?--Dietitian following, appreciate assistance.   ?--s/p IR gtube 3/6. Pt pulled it out on 3/23 and it was replaced on 3/24.  ?- tube feeds to be restarted.  ? ?Hyperglycemia ?Probably secondary to steroids.  ?A1c is 5.2% ? ? ?Anemia of chronic disease ?Continue to monitor. ?Transfuse to keep hemoglobin greater than 7  ? ? ? ?Thrombocytosis:  ?Probably reactive.  ?Resolved.  ? ?  ? ?  Malnutrition Type: ? ?Nutrition Problem: Severe Malnutrition ?Etiology: social / environmental circumstances ? ? ?Malnutrition Characteristics: ? ?Signs/Symptoms: severe muscle depletion, severe fat depletion ? ? ?Nutrition Interventions: ? ?Interventions: Tube feeding, Prostat, MVI ? ?Estimated body mass index is 15.82 kg/m? as calculated from the following: ?  Height as of this encounter: 5\' 10"  (1.778 m). ?  Weight as of this encounter: 50 kg. ? ?Code Status: full code.  ?DVT Prophylaxis:  SCD's Start: 06/29/21 1231 ? ? ?Level of Care: Level of care: Progressive transition to med surg today if okay with primary.  ? ? ? ? ?Antimicrobials:  ? ?Anti-infectives (From admission, onward)  ? ? Start     Dose/Rate Route  Frequency Ordered Stop  ? 08/21/21 0600  ceFAZolin (ANCEF) IVPB 2g/100 mL premix       ? 2 g ?200 mL/hr over 30 Minutes Intravenous Every 8 hours 08/20/21 1243 08/26/21 0559  ? 08/19/21 0000  cefTRIAXone (ROCEPHIN)

## 2021-08-21 NOTE — Progress Notes (Signed)
? ?  Providing Compassionate, Quality Care - Together ? ? ?Subjective: ?Patient asked me if he was "losing his mind" today. He realizes he does not remember large periods of time over the last month and is confused about his location. ? ?Objective: ?Vital signs in last 24 hours: ?Temp:  [97.5 ?F (36.4 ?C)-98.8 ?F (37.1 ?C)] 97.6 ?F (36.4 ?C) (03/28 [id number]) ?Pulse Rate:  [78-100] 78 (03/28 0851) ?Resp:  [15-19] 15 (03/28 0851) ?BP: (113-146)/(81-94) 133/89 (03/28 0851) ?SpO2:  [100 %] 100 % (03/28 0851) ?Weight:  [50 kg] 50 kg (03/28 0500) ? ?Intake/Output from previous day: ?03/27 0701 - 03/28 0700 ?In: 1465.4 [I.V.:362.7; NG/GT:906; IV Piggyback:96.7] ?Out: 1100 [Urine:1100] ?Intake/Output this shift: ?Total I/O ?In: 70 [P.O.:237] ?Out: 600 [Urine:600] ? ?Alert, calm ?Oriented to self ?MAE, LLE >RLE ?Decreased fine motor BUE ?Incision is healing well ? ?Lab Results: ?Recent Labs  ?  08/19/21 ?0813 08/20/21 ?[id number]  ?WBC 10.7* 7.7  ?HGB 8.7* 8.3*  ?HCT 25.9* 25.0*  ?PLT 388 392  ? ?BMET ?Recent Labs  ?  08/19/21 ?0813 08/20/21 ?[id number]  ?NA 133* 136  ?K 3.9 3.7  ?CL 102 105  ?CO2 24 25  ?GLUCOSE 119* 115*  ?BUN 12 11  ?CREATININE 0.61 0.58*  ?CALCIUM 9.4 9.6  ? ? ?Studies/Results: ?No results found. ? ?Assessment/Plan: ?Patient status post C3-4, C4-5, C5-6, C6-7 anterior cervical discectomy with interbody fusion by Dr. Annette Stable on 06/29/2021. Increased difficulty swallowing with lung atelectasis and elevated temperatures on 07/02/2021. Made NPO following MBS by SLP. 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 CTA head and neck negative for stroke. MRI revealed epidural hematoma. Patient underwent exploration of his cervical fusion with evacuation of the epidural hematoma on 07/03/2021. Cortrak was placed on 07/04/2021. Patient's strength much improved since epidural hematoma evacuation. Cortrak removed 07/06/2021 and dysphagia 2 diet with nectar thick liquids started. Patient with  delirium vs ETOH withdrawal. Discontinued steroids 07/06/2021. CIWA protocol started and discontinued 07/07/2021. CT and MRI 07/07/2021 negative. Patient developed respiratory distress, requiring intubation on 07/15/2021. Work up revealed aspiration PNA and PE. Patient was extubated on 07/22/2021. Foley catheter removed 07/24/2021. Patient transferred to 3W on 07/26/2021. Foley replaced 07/28/2021. PEG placed 07/30/2021. Foley removed 08/09/2021. PEG pulled out by patient 08/16/2021. Delirium worsening 08/17/2021. Haldol administered. IR replaced PEG on 08/17/2021. Patient with urosepsis 08/18/2021. Culture grew E. Coli. ? ? LOS: 53 days  ? ?-Continue to reorient the patient. He specifically requested to be reminded of the date and time by the staff who work with him. ?-Goal is for SNF placement. Continue supportive efforts. ? ? ?Viona Gilmore, DNP, AGNP-C ?Nurse Practitioner ? ?[facility] ?[address], [address], [place], [place] [address] ?[id number]    [id number] ? ?08/21/2021, 11:04 AM ? ? ? ? ?

## 2021-08-21 NOTE — Progress Notes (Signed)
ANTICOAGULATION CONSULT NOTE - Initial Consult ? ?Pharmacy Consult for converting lovenox to apixaban ?Indication: pulmonary embolus on 07/14/2021 ? ?No Known Allergies ? ?Patient Measurements: ?Height: 5\' 10"  (177.8 cm) ?Weight: 50 kg (110 lb 3.7 oz) ?IBW/kg (Calculated) : 73 ? ? ?Vital Signs: ?Temp: 98.6 ?F (37 ?C) (03/28 1206) ?Temp Source: Oral (03/28 1206) ?BP: 123/81 (03/28 1206) ?Pulse Rate: 81 (03/28 1206) ? ?Labs: ?Recent Labs  ?  08/19/21 ?0813 08/20/21 ?[id number]  ?HGB 8.7* 8.3*  ?HCT 25.9* 25.0*  ?PLT 388 392  ?CREATININE 0.61 0.58*  ? ? ?Estimated Creatinine Clearance: 69.4 mL/min (A) (by C-G formula based on SCr of 0.58 mg/dL (L)). ? ? ?Medical History: ?History reviewed. No pertinent past medical history. ? ? ?Assessment: ?Patient was diagnosed with a PE on 2/18. Started on a heparin drip from 2/18 through 2/19. Started on bivalrudin from 2/19 through 2/27 and then transitioned to apixaban 2/27 through 3/24. On 3/24, he was transitioned to therapeutic lovenox for a procedure. Patient is being transitioned back to apixaban. ? ?Goal of Therapy:  ?Monitor platelets by anticoagulation protocol: Yes ?  ?Plan:  ?Apixaban 5 mg po bid ?Pharmacy has signed off on consult but will continue to monitor patient for signs and symptoms of bleeding along with labs as needed ? ?Johnetta Sloniker BS, PharmD, BCPS ?Clinical Pharmacist ?08/21/2021 12:13 PM ? ? ?

## 2021-08-21 NOTE — Progress Notes (Signed)
Occupational Therapy Treatment ?Patient Details ?Name: Brian [name] ?MRN: [id number] ?DOB: Jul 17, 1960 ?Today's Date: 08/21/2021 ? ? ?History of present illness Pt is a 61 y/o male admitted 06/29/21 following C3-7 ACDF after progressive cervical myelopathy symptoms including ataxia, bil UE and LE shaking and weakness, and loss of function in his hands with subsequent fall. Pt initially had improvement in neurological symptoms, but postoperative course was complicated by dysphasia and ataxia secondary to and epidural hematoma with evacuation of hematoma on 2/7. Course complicated by delirium secondary to encephalopathy, sepsis secondary to aspiration PNA, reintubated 2/19-2/26 for airway protection, bilat segmental PEs 2/18. PEG placed 3/6. PMHx includes tobacco and EtOH use disorder. ?  ?OT comments ? Patient received in bed with nursing tech assisting with self care. Therapist instructed patient on participation with self care and rolling. Patient performed dressing with donning gown seated on EOB and was squat pivot transfer to recliner. Patient was instructed in AROM for BUE shoulders and has increased to resistive exercises for elbow flexion and extension with level one therapy band.  Patient address grip and fine motor strengthening with yellow therapy putty exercises. Patient continues to make good gains and will continue to be followed by acute OT.   ? ?Recommendations for follow up therapy are one component of a multi-disciplinary discharge planning process, led by the attending physician.  Recommendations may be updated based on patient status, additional functional criteria and insurance authorization. ?   ?Follow Up Recommendations ? Skilled nursing-short term rehab (<3 hours/day)  ?  ?Assistance Recommended at Discharge Frequent or constant Supervision/Assistance  ?Patient can return home with the following ? Two people to help with walking and/or transfers;Direct supervision/assist for medications  management;Assist for transportation;Help with stairs or ramp for entrance;Assistance with feeding;Two people to help with bathing/dressing/bathroom;Assistance with cooking/housework;Direct supervision/assist for financial management ?  ?Equipment Recommendations ? Wheelchair (measurements OT);Wheelchair cushion (measurements OT);Hospital bed;BSC/3in1;Tub/shower seat  ?  ?Recommendations for Other Services   ? ?  ?Precautions / Restrictions Precautions ?Precautions: Fall ?Restrictions ?Weight Bearing Restrictions: No  ? ? ?  ? ?Mobility Bed Mobility ?Overal bed mobility: Needs Assistance ?Bed Mobility: Rolling, Sidelying to Sit ?Rolling: Min assist ?Sidelying to sit: Mod assist ?  ?  ?  ?General bed mobility comments: required assistance with trunk ?  ? ?Transfers ?Overall transfer level: Needs assistance ?Equipment used: None ?Transfers: Bed to chair/wheelchair/BSC ?  ?  ?Squat pivot transfers: Mod assist ?  ?  ?  ?General transfer comment: mod assist for transfer from EOB to recliner ?  ?  ?Balance Overall balance assessment: Needs assistance ?Sitting-balance support: Single extremity supported, Feet supported ?Sitting balance-Leahy Scale: Poor ?Sitting balance - Comments: one extremity support for sitting balance on EOB ?  ?  ?  ?  ?  ?  ?  ?  ?  ?  ?  ?  ?  ?  ?  ?   ? ?ADL either performed or assessed with clinical judgement  ? ?ADL Overall ADL's : Needs assistance/impaired ?  ?  ?Grooming: Wash/dry hands;Wash/dry face;Minimal assistance;Sitting ?Grooming Details (indicate cue type and reason): asked therapist to do it for him intially but was able to perform with min assist ?Upper Body Bathing: Maximal assistance;Bed level ?  ?Lower Body Bathing: Total assistance;Bed level ?  ?Upper Body Dressing : Maximal assistance;Sitting ?Upper Body Dressing Details (indicate cue type and reason): donned gown seated on EOB ?Lower Body Dressing: Total assistance;Bed level ?Lower Body Dressing Details (indicate cue type and  reason):  to donn socks ?  ?  ?  ?  ?  ?  ?  ?General ADL Comments: bathing performed at bed level with nursing tech present ?  ? ?Extremity/Trunk Assessment Upper Extremity Assessment ?RUE Deficits / Details: limited finger extension ?RUE Sensation: decreased light touch;decreased proprioception ?RUE Coordination: decreased fine motor;decreased gross motor ?LUE Deficits / Details: limited finger extension ?LUE Sensation: decreased light touch ?LUE Coordination: decreased fine motor;decreased gross motor ?  ?  ?  ?  ?  ? ?Vision   ?  ?  ?Perception   ?  ?Praxis   ?  ? ?Cognition Arousal/Alertness: Awake/alert ?Behavior During Therapy: [facility] for tasks assessed/performed ?Overall Cognitive Status: No family/caregiver present to determine baseline cognitive functioning ?Area of Impairment: Problem solving, Awareness ?  ?  ?  ?  ?  ?  ?  ?  ?Orientation Level: Disoriented to, Time, Situation ?Current Attention Level: Sustained ?Memory: Decreased short-term memory, Decreased recall of precautions ?Following Commands: Follows one step commands with increased time ?Safety/Judgement: Decreased awareness of safety, Decreased awareness of deficits ?Awareness: Intellectual ?Problem Solving: Requires verbal cues, Requires tactile cues, Difficulty sequencing ?General Comments: believes he changed room and was difficult to convence otherwise ?  ?  ?   ?Exercises Exercises: General Upper Extremity, Other exercises ?General Exercises - Upper Extremity ?Shoulder Flexion: AROM, Both, 10 reps, Seated ?Shoulder Extension: AROM, Both, 10 reps, Seated ?Elbow Flexion: Strengthening, Both, 10 reps, Seated, Theraband ?Theraband Level (Elbow Flexion): Level 1 (Yellow) ?Elbow Extension: Strengthening, Both, 10 reps, Seated, Theraband ?Theraband Level (Elbow Extension): Level 1 (Yellow) ?Other Exercises ?Other Exercises: yellow therapy putty exercises to address [facility] and gross grasp ? ?  ?Shoulder Instructions   ? ? ?  ?General Comments     ? ? ?Pertinent Vitals/ Pain       Pain Assessment ?Pain Assessment: Faces ?Faces Pain Scale: No hurt ?Pain Intervention(s): Monitored during session ? ?Home Living   ?  ?  ?  ?  ?  ?  ?  ?  ?  ?  ?  ?  ?  ?  ?  ?  ?  ?  ? ?  ?Prior Functioning/Environment    ?  ?  ?  ?   ? ?Frequency ? Min 2X/week  ? ? ? ? ?  ?Progress Toward Goals ? ?OT Goals(current goals can now be found in the care plan section) ? Progress towards OT goals: Progressing toward goals ? ?Acute Rehab OT Goals ?Patient Stated Goal: get better ?OT Goal Formulation: With patient ?Time For Goal Achievement: 08/20/21 ?Potential to Achieve Goals: Fair ?ADL Goals ?Pt Will Perform Eating: with min assist;with assist to don/doff brace/orthosis;with adaptive utensils;sitting ?Pt Will Perform Grooming: sitting;with min assist ?Pt Will Perform Upper Body Bathing: with mod assist;sitting ?Pt Will Transfer to Toilet: with min assist;stand pivot transfer;bedside commode ?Pt/caregiver will Perform Home Exercise Program: Increased ROM;Increased strength;Both right and left upper extremity;With theraputty;With minimal assist;With written HEP provided ?Additional ADL Goal #1: Pt will grasp objects with R hand and bring to mouth 10/10 trials in prep for self feeding ?Additional ADL Goal #2: Pt will grasp/release 5/5 objects with L hand to increase indep with ADLs  ?Plan Discharge plan remains appropriate   ? ?Co-evaluation ? ? ?   ?  ?  ?  ?  ? ?  ?AM-PAC OT "6 Clicks" Daily Activity     ?Outcome Measure ? ? Help from another person eating meals?: A Lot ?Help from another person  taking care of personal grooming?: A Lot ?Help from another person toileting, which includes using toliet, bedpan, or urinal?: Total ?Help from another person bathing (including washing, rinsing, drying)?: Total ?Help from another person to put on and taking off regular upper body clothing?: Total ?Help from another person to put on and taking off regular lower body clothing?: Total ?6 Click  Score: 8 ? ?  ?End of Session Equipment Utilized During Treatment: Gait belt ? ?OT Visit Diagnosis: Other abnormalities of gait and mobility (R26.89);Muscle weakness (generalized) (M62.81);Feeding difficulties (R63.

## 2021-08-22 DIAGNOSIS — R7881 Bacteremia: Secondary | ICD-10-CM | POA: Diagnosis not present

## 2021-08-22 DIAGNOSIS — B9620 Unspecified Escherichia coli [E. coli] as the cause of diseases classified elsewhere: Secondary | ICD-10-CM | POA: Diagnosis not present

## 2021-08-22 DIAGNOSIS — G959 Disease of spinal cord, unspecified: Secondary | ICD-10-CM | POA: Diagnosis not present

## 2021-08-22 LAB — GLUCOSE, CAPILLARY
Glucose-Capillary: 119 mg/dL — ABNORMAL HIGH (ref 70–99)
Glucose-Capillary: 119 mg/dL — ABNORMAL HIGH (ref 70–99)
Glucose-Capillary: 106 mg/dL — ABNORMAL HIGH (ref 70–99)
Glucose-Capillary: 102 mg/dL — ABNORMAL HIGH (ref 70–99)
Glucose-Capillary: 101 mg/dL — ABNORMAL HIGH (ref 70–99)
Glucose-Capillary: 134 mg/dL — ABNORMAL HIGH (ref 70–99)

## 2021-08-22 NOTE — Progress Notes (Signed)
Patient's urosepsis appears resolved.  His mental status is more clear though he remains pleasantly confused.  His motor and sensory function in his extremities continue to significantly improve. ? ?Continue supportive efforts. ?

## 2021-08-22 NOTE — Assessment & Plan Note (Addendum)
-  Urine culture and blood cultures x2 08/17/2021 positive for E. coli.  ?-Evaluated by Infectious Disease.   ?-Status post 7-day course of antibiotics per ID.  ?

## 2021-08-22 NOTE — Progress Notes (Signed)
Occupational Therapy Treatment ?Patient Details ?Name: Brian [name] ?MRN: [id number] ?DOB: 1960/09/07 ?Today's Date: 08/22/2021 ? ? ?History of present illness Pt is a 61 y/o male admitted 06/29/21 following C3-7 ACDF after progressive cervical myelopathy symptoms including ataxia, bil UE and LE shaking and weakness, and loss of function in his hands with subsequent fall. Pt initially had improvement in neurological symptoms, but postoperative course was complicated by dysphasia and ataxia secondary to and epidural hematoma with evacuation of hematoma on 2/7. Course complicated by delirium secondary to encephalopathy, sepsis secondary to aspiration PNA, reintubated 2/19-2/26 for airway protection, bilat segmental PEs 2/18. PEG placed 3/6. PMHx includes tobacco and EtOH use disorder. ?  ?OT comments ? Pt making progress with OT goals. He continues to benefit for +2 assist, to assist pt with OOB activities, as well as standing tasks. Pt limited by poor balance and ataxic BLE and BUE movements. He continues to require +2 mod-max A for transfers and standing at the sink to complete grooming tasks, due to difficulty remaining standing, while using BUE to complete a task. Continue to recommend SNF to maximize independence. OT will follow acutely.   ? ?Recommendations for follow up therapy are one component of a multi-disciplinary discharge planning process, led by the attending physician.  Recommendations may be updated based on patient status, additional functional criteria and insurance authorization. ?   ?Follow Up Recommendations ? Skilled nursing-short term rehab (<3 hours/day)  ?  ?Assistance Recommended at Discharge Frequent or constant Supervision/Assistance  ?Patient can return home with the following ? Two people to help with walking and/or transfers;Direct supervision/assist for medications management;Assist for transportation;Help with stairs or ramp for entrance;Assistance with feeding;Two people to help with  bathing/dressing/bathroom;Assistance with cooking/housework;Direct supervision/assist for financial management ?  ?Equipment Recommendations ? Wheelchair (measurements OT);Wheelchair cushion (measurements OT);Hospital bed;BSC/3in1;Tub/shower seat  ?  ?Recommendations for Other Services   ? ?  ?Precautions / Restrictions Precautions ?Precautions: Fall ?Precaution Booklet Issued: Yes (comment) ?Precaution Comments: Verbally reviewed precautions ?Required Braces or Orthoses: Cervical Brace (has been d/c'd) ?Cervical Brace:  (now d/c'd) ?Restrictions ?Weight Bearing Restrictions: No  ? ? ?  ? ?Mobility Bed Mobility ?Overal bed mobility: Needs Assistance ?Bed Mobility: Supine to Sit ?  ?  ?Supine to sit: Mod assist ?  ?  ?General bed mobility comments: pt initiated transfer and then reached for PTs hand, PT/OT instructed pt to use bed rail however pt deferred ?  ? ?Transfers ?Overall transfer level: Needs assistance ?Equipment used: Rolling walker (2 wheels), 2 person hand held assist ?Transfers: Bed to chair/wheelchair/BSC, Sit to/from Stand ?Sit to Stand: +2 physical assistance, Mod assist ?  ?  ?Step pivot transfers: +2 physical assistance, Max assist ?  ?  ?General transfer comment: pt unable to advance R LE, pt dependent, pt with noted flexor tone/stiffness with shakiness in trying to step, pt also with noted significant adduction of L LE towards R due to tone ?  ?  ?Balance Overall balance assessment: Needs assistance ?Sitting-balance support: Single extremity supported, Feet supported ?Sitting balance-Leahy Scale: Poor ?Sitting balance - Comments: one extremity support for sitting balance on EOB ?Postural control: Posterior lean ?Standing balance support: Bilateral upper extremity supported ?Standing balance-Leahy Scale: Poor ?Standing balance comment: worked on standing at the sink, pt continues with L LE adduction requiring verbal and tactile cues to maintain wide base of support ?  ?  ?  ?  ?  ?  ?  ?  ?  ?  ?   ?   ? ?  ADL either performed or assessed with clinical judgement  ? ?ADL Overall ADL's : Needs assistance/impaired ?  ?  ?Grooming: Wash/dry hands;Wash/dry face;Moderate assistance;Standing (+2) ?Grooming Details (indicate cue type and reason): Pt needing assist to remain standing, while OT provided UE support and verbal cuing for pt to attending to washing his face and hands at the sink ?  ?  ?  ?  ?  ?  ?  ?  ?Toilet Transfer: Maximal assistance;+2 for physical assistance;+2 for safety/equipment;Stand-pivot ?Toilet Transfer Details (indicate cue type and reason): Pt able to stand with mod A however needed +2 max A to maintain standing and pivot due to ataxia in BLE ?  ?  ?  ?  ?Functional mobility during ADLs: Maximal assistance;+2 for physical assistance;+2 for safety/equipment ?General ADL Comments: Pt limited by weakness and ataxia ?  ? ?Extremity/Trunk Assessment   ?  ?  ?  ?  ?  ? ?Vision   ?  ?  ?Perception   ?  ?Praxis   ?  ? ?Cognition Arousal/Alertness: Awake/alert ?Behavior During Therapy: [facility] for tasks assessed/performed ?Overall Cognitive Status: No family/caregiver present to determine baseline cognitive functioning ?Area of Impairment: Problem solving, Awareness ?  ?  ?  ?  ?  ?  ?  ?  ?  ?Current Attention Level: Sustained ?Memory: Decreased short-term memory, Decreased recall of precautions ?Following Commands: Follows one step commands with increased time ?Safety/Judgement: Decreased awareness of safety, Decreased awareness of deficits ?Awareness: Intellectual ?Problem Solving: Requires verbal cues, Requires tactile cues, Difficulty sequencing ?General Comments: after working with PT/OT, pt aware that its highly unlikely that he will walk on his own in 2 weeks despite stating earlier in session he would. Pt initially with unrealistic goals but then re-evaluated after PT/OT session ?  ?  ?   ?Exercises Other Exercises ?Other Exercises: worked on sit to stands with legs shoulder width apart and not  letting L LE adduct ?Other Exercises: Wt shifts side to side with UE support. at sink ? ?  ?Shoulder Instructions   ? ? ?  ?General Comments VSS on RA  ? ? ?Pertinent Vitals/ Pain       Pain Assessment ?Pain Assessment: No/denies pain ? ?Home Living   ?  ?  ?  ?  ?  ?  ?  ?  ?  ?  ?  ?  ?  ?  ?  ?  ?  ?  ? ?  ?Prior Functioning/Environment    ?  ?  ?  ?   ? ?Frequency ? Min 2X/week  ? ? ? ? ?  ?Progress Toward Goals ? ?OT Goals(current goals can now be found in the care plan section) ? Progress towards OT goals: Progressing toward goals ? ?Acute Rehab OT Goals ?Patient Stated Goal: To walk again ?OT Goal Formulation: With patient ?Time For Goal Achievement: 09/05/21 ?Potential to Achieve Goals: Fair ?ADL Goals ?Pt Will Perform Eating: with min assist;with assist to don/doff brace/orthosis;with adaptive utensils;sitting ?Pt Will Perform Grooming: sitting;with min assist ?Pt Will Perform Upper Body Bathing: with mod assist;sitting ?Pt Will Transfer to Toilet: with min assist;stand pivot transfer;bedside commode ?Pt/caregiver will Perform Home Exercise Program: Increased ROM;Increased strength;Both right and left upper extremity;With theraputty;With minimal assist;With written HEP provided ?Additional ADL Goal #1: Pt will grasp objects with R hand and bring to mouth 10/10 trials in prep for self feeding ?Additional ADL Goal #2: Pt will grasp/release 5/5 objects with L hand to increase indep  with ADLs  ?Plan Discharge plan remains appropriate   ? ?Co-evaluation ? ? ? PT/OT/SLP Co-Evaluation/Treatment: Yes ?Reason for Co-Treatment: Complexity of the patient's impairments (multi-system involvement);To address functional/ADL transfers;For patient/therapist safety ?  ?OT goals addressed during session: ADL's and self-care;Strengthening/ROM ?  ? ?  ?AM-PAC OT "6 Clicks" Daily Activity     ?Outcome Measure ? ? Help from another person eating meals?: A Lot ?Help from another person taking care of personal grooming?: A  Lot ?Help from another person toileting, which includes using toliet, bedpan, or urinal?: A Lot ?Help from another person bathing (including washing, rinsing, drying)?: A Lot ?Help from another person to put

## 2021-08-22 NOTE — Progress Notes (Signed)
Physical Therapy Treatment ?Patient Details ?Name: Brian [name] ?MRN: [id number] ?DOB: Jun 18, 1960 ?Today's Date: 08/22/2021 ? ? ?History of Present Illness Pt is a 61 y/o male admitted 06/29/21 following C3-7 ACDF after progressive cervical myelopathy symptoms including ataxia, bil UE and LE shaking and weakness, and loss of function in his hands with subsequent fall. Pt initially had improvement in neurological symptoms, but postoperative course was complicated by dysphasia and ataxia secondary to and epidural hematoma with evacuation of hematoma on 2/7. Course complicated by delirium secondary to encephalopathy, sepsis secondary to aspiration PNA, reintubated 2/19-2/26 for airway protection, bilat segmental PEs 2/18. PEG placed 3/6. PMHx includes tobacco and EtOH use disorder. ? ?  ?PT Comments  ? ? Pt more aware of deficits. Pt giving great effort and would strongly benefit from aggressive rehab program. Pt continues with ataxia t/o extremities and trunk with strong L LE adduction during standing and attempt to ambulate. Focused on controlling L LE in standing and during sit to stand at sink in conjunction with OT doing ADLs. Acute PT to cont to follow. ?  ?Recommendations for follow up therapy are one component of a multi-disciplinary discharge planning process, led by the attending physician.  Recommendations may be updated based on patient status, additional functional criteria and insurance authorization. ? ?Follow Up Recommendations ? Skilled nursing-short term rehab (<3 hours/day) ?  ?  ?Assistance Recommended at Discharge Frequent or constant Supervision/Assistance  ?Patient can return home with the following Two people to help with walking and/or transfers;A lot of help with bathing/dressing/bathroom;Assistance with cooking/housework;Assistance with feeding;Direct supervision/assist for medications management;Direct supervision/assist for financial management;Assist for transportation;Help with stairs or  ramp for entrance ?  ?Equipment Recommendations ? Wheelchair (measurements PT);Wheelchair cushion (measurements PT);Hospital bed  ?  ?Recommendations for Other Services Rehab consult ? ? ?  ?Precautions / Restrictions Precautions ?Precautions: Fall ?Precaution Booklet Issued: Yes (comment) ?Precaution Comments: Verbally reviewed precautions ?Required Braces or Orthoses: Cervical Brace (has been d/c'd) ?Cervical Brace:  (now d/c'd) ?Restrictions ?Weight Bearing Restrictions: No  ?  ? ?Mobility ? Bed Mobility ?Overal bed mobility: Needs Assistance ?Bed Mobility: Supine to Sit ?  ?  ?Supine to sit: Mod assist ?  ?  ?General bed mobility comments: pt initiated transfer and then reached for PTs hand, PT/OT instructed pt to use bed rail however pt deferred ?  ? ?Transfers ?Overall transfer level: Needs assistance ?Equipment used: Rolling walker (2 wheels), 2 person hand held assist ?Transfers: Bed to chair/wheelchair/BSC, Sit to/from Stand ?Sit to Stand: +2 physical assistance, Mod assist ?  ?Step pivot transfers: Mod assist, +2 physical assistance ?  ?  ?  ?General transfer comment: pt unable to advance R LE, pt dependent, pt with noted flexor tone/stiffness with shakiness in trying to step, pt also with noted significant adduction of L LE towards R due to tone ?  ? ?Ambulation/Gait ?  ?  ?  ?  ?Gait velocity: Decreased ?  ?  ?General Gait Details: pt unable to advance LEs without max A and is unable to manage walker ? ? ?Stairs ?  ?  ?  ?  ?  ? ? ?Wheelchair Mobility ?  ? ?Modified Rankin (Stroke Patients Only) ?  ? ? ?  ?Balance Overall balance assessment: Needs assistance ?Sitting-balance support: Single extremity supported, Feet supported ?Sitting balance-Leahy Scale: Poor ?Sitting balance - Comments: one extremity support for sitting balance on EOB ?Postural control: Posterior lean ?Standing balance support: Bilateral upper extremity supported ?Standing balance-Leahy Scale: Poor ?Standing balance  comment: worked on  standing at the sink, pt continues with L LE adduction requiring verbal and tactile cues to maintain wide base of support ?  ?  ?  ?  ?  ?  ?  ?  ?  ?  ?  ?  ? ?  ?Cognition Arousal/Alertness: Awake/alert ?Behavior During Therapy: [facility] for tasks assessed/performed ?Overall Cognitive Status: No family/caregiver present to determine baseline cognitive functioning ?Area of Impairment: Problem solving, Awareness ?  ?  ?  ?  ?  ?  ?  ?  ?  ?Current Attention Level: Sustained ?Memory: Decreased short-term memory, Decreased recall of precautions ?Following Commands: Follows one step commands with increased time ?Safety/Judgement: Decreased awareness of safety, Decreased awareness of deficits ?Awareness: Intellectual ?Problem Solving: Requires verbal cues, Requires tactile cues, Difficulty sequencing ?General Comments: after working with PT, pt aware that its highly unlikely that he will walk on his own in 2 weeks despite stating earlier in session he would. Pt initially with unrealistic goals but then re-evaluated after PT/OT session ?  ?  ? ?  ?Exercises Other Exercises ?Other Exercises: worked on sit to stands with legs shoulder width apart and not letting L LE adduct ?Other Exercises: Wt shifts side to side with UE support. at sink ? ?  ?General Comments General comments (skin integrity, edema, etc.): worked with OT at the sink, PT to provide tacile cues and support for optimal standing posture as pt with bilat hip/knee flexion and truncal ataxia ?  ?  ? ?Pertinent Vitals/Pain Pain Assessment ?Pain Assessment: No/denies pain  ? ? ?Home Living   ?  ?  ?  ?  ?  ?  ?  ?  ?  ?   ?  ?Prior Function    ?  ?  ?   ? ?PT Goals (current goals can now be found in the care plan section) Acute Rehab PT Goals ?Patient Stated Goal: walk in 2 weeks ?PT Goal Formulation: With patient ?Time For Goal Achievement: 09/04/21 ?Potential to Achieve Goals: Fair ?Progress towards PT goals: Progressing toward goals ? ?  ?Frequency ? ? ? Min  3X/week ? ? ? ?  ?PT Plan Current plan remains appropriate;Frequency needs to be updated  ? ? ?Co-evaluation PT/OT/SLP Co-Evaluation/Treatment: Yes ?Reason for Co-Treatment: Complexity of the patient's impairments (multi-system involvement) ?PT goals addressed during session: Mobility/safety with mobility ?  ?  ? ?  ?AM-PAC PT "6 Clicks" Mobility   ?Outcome Measure ? Help needed turning from your back to your side while in a flat bed without using bedrails?: A Little ?Help needed moving from lying on your back to sitting on the side of a flat bed without using bedrails?: A Little ?Help needed moving to and from a bed to a chair (including a wheelchair)?: A Lot ?Help needed standing up from a chair using your arms (e.g., wheelchair or bedside chair)?: A Lot ?Help needed to walk in hospital room?: Total ?Help needed climbing 3-5 steps with a railing? : Total ?6 Click Score: 12 ? ?  ?End of Session Equipment Utilized During Treatment: Gait belt ?Activity Tolerance: Patient tolerated treatment well ?Patient left: with call bell/phone within reach;with chair alarm set;in chair ?Nurse Communication: Mobility status ?PT Visit Diagnosis: Muscle weakness (generalized) (M62.81);Other symptoms and signs involving the nervous system (R29.898);Difficulty in walking, not elsewhere classified (R26.2);Other abnormalities of gait and mobility (R26.89) ?  ? ? ?Time: [id number] ?PT Time Calculation (min) (ACUTE ONLY): 35 min ? ?Charges:  $Neuromuscular Re-education:  8-22 mins          ?          ? ?Kittie Plater, PT, DPT ?Acute Rehabilitation Services ?Pager #: [phone] ?Office #: [phone] ? ? ? ?Eltha Tingley M Gabrielle Mester ?08/22/2021, 12:34 PM ? ?

## 2021-08-22 NOTE — Progress Notes (Signed)
?PROGRESS NOTE ? ? ? ?Brian [name]  [id number] DOB: 1961/01/29 DOA: 06/29/2021 ?PCP: Pcp, No  ? ? 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  He was admitted on 2/3 and underwent ACDF of C3-4, C4-5, C5-5, and C6-7.  Post-operatively he was progressing slowly with residual weakness in both hands but improving lower extremity strength and function.  He developed some difficulty swallowing on 2/5 and SLP ordered and placed on thickened liquids.   Overnight 2/6, patient with increased difficulty swallowing and was made NPO but also noted to have dysarthria and difficulty moving extremities with numbness.  SLP evaluated and found to be moderate aspiration risk.   Also progressively tachycardic, tachypneic, and now febrile.  Code stroke activated and taken for CT/ CTA head and neck and was given decadron 10mg  once.  NIHSS 18.  CTH was negative for acute findings, and CTA head neck did not reveal any LVO but noted significant prevertebral soft tissue swelling with foci of gas present at the ventral epidural space at C4-5, small collection not excluded. On 2/7, patient with worsening confusion and concern for airway involvement, PCCM consulted and remained in the intensive care unit until he is transferred to the floor on 3/2 and PCCM requested transition to [facility] for medical assistance while patient remains under the neurosurgery service. ? ?Significant Hospital Events: ?2/3 ACDF C3-4, C4-5, C5-5, and C6-7 w/ Dr. Annette Stable ?2/6 SLP eval for difficulty swallowing ?2/7 Code stroke overnight, neg for LVO, CT showing soft tissue swelling.  PCCM consulted for concern of airway  management and AMS.  Went for evacuation of hematoma  ?2/18 CT Abd/Pelvis: showing bilateral segmental Pes, started on heparin and showing worsening pneumonia, febrile to  103.  PCT rising, 34.6, restarted on abx ?2/19 possible aspiration w/ severe hypoxia; intubated; Switching from heparin to angiomax given subtherapeutic levels despite increase in rate. ?2/20 Echo shows normal LV systolic function. There was notation of a + McConnell's sign c/w a large pulmonary embolus which is consistent with the finding of bilateral segmental pulmonary emboli noted on CT 07/14/2021. ?2/21 rash appreciated on back; stopped cefepim/vanc switched to zosyn; required low dose levo with increase in fentanyl ?2/22 remains intubated; unable to extubate to do increase RR and thick secretions ?2/23 [name] infusion added ?2/24: weaning fentanyl.  ?2/26 extubated ?2/28 tachycardia persists ?3/2: Modified barium swallow, continues n.p.o. with core track in place, likely will need PEG tube. ?3/6: s/p IR G-tube placement ?Now medically clear awaiting SNF placement. ?  ? ?  ?Assessment and Plan: ?* Cervical myelopathy ([place]); severe cervical spinal stenosis with cord compression s/p ACDF [id number] on 2/3, complicated by epidural hematoma s/p evacuation 2/7 ?Patient initially presented s/p recent fall with progressive upper and lower extremity weakness and tremors, He was found to have critical multilevel cervical spinal stenosis @ C3-3 6 with spinal cord compression and spinal cord signal change and underwent ACDF C3-4, C4-5, C5-5, and C6/7 by neurosurgery Dr. Trenton Gammon on 06/29/2021.  Postoperative leg complicated by epidural hematoma s/p evacuation on 2/7. ?--Continue PT/OT/SLP efforts, Awaiting SNF; but given improving strength may be able to progress to home with home health ?--Further as per neurosurgery recommendation. ? ?Acute respiratory failure with  hypoxia ([place]) ?Likely Multifactorial with significant dysphagia and recurrent aspiration  pneumonia events during initial hospitalization following ACDF surgery with postoperative complications of postoperative cervical hematoma.  Patient did require ventilatory support in the intensive care unit and was successfully extubated on 07/22/2021.  Patient completed extensive course of empiric antibiotics.  Oxygen now weaned off, Now on room air. ?--Continue Aspiration precautions ? ? ? ? ?Acute pulmonary embolism ([place]) ?CTA chest showed bilateral segmental PE.  LE [place] negative for DVT.  TTE with normal LV systolic function.  Supplemental oxygen weaned off. ?--Eliquis 5mg  BID ? ?Acute metabolic encephalopathy ?CT head, MRI brain, EEG, TSH, B12, ammonia unrevealing. Etiology likely multifactorial with acute respiratory failure secondary to aspiration pneumonia, bilateral pulmonary embolism.  Completed course of antibiotics for aspiration pneumonia.  Seroquel and clonazepam discontinued. ?--Continues on cefazolin as below for E. coli UTI/septicemia ? ?E. coli bacteremia ?Urine culture and blood cultures x2 08/17/2028 positive for E. coli.  Evaluated by infectious disease. ?--Continue cefazolin 2 g IV every 8 hours x7 days per ID recommendation (End date 4/1) ? ?Severe sepsis due to recurrent aspiration pneumonia ?Likely recurrent issue during hospitalization, remains n.p.o. due to continued aspiration risk per SLP.  Completed extensive course of antibiotics while under ICU care. S/p IR gtube 3/6. ?--Continue tube feeds; now cleared for dysphagia 3 mechanical soft diet with thin liquids per SLP ?--Continue aspiration precautions ?--Continue SLP efforts while inpatient ? ?Sinus tachycardia ?Multifactorial including sepsis, dehydration, PE, agitation.   ?--Continue metoprolol 100 mg twice daily ? ? ? ?Elevated liver enzymes ?Etiology likely secondary to sepsis from aspiration pneumonia as above.  Acute hepatitis panel negative.  RUQ ultrasound unremarkable. ?--Continue monitor LFTs  intermittently ? ?Hyponatremia ?Etiology likely secondary to dehydration.  Urine sodium low.  Continue tube feeds; now cleared by speech therapy for dysphagia 3 diet.  Sodium now up to 136 as of 08/20/2021. ? ? ?Tobacco use disorder ?Encouraged cessation. ?--Nicotine patch; decreased to 7 mg on 3/15 ? ?Physical deconditioning ?Significant weakness in all extremities as a result of cervical myelopathy and acute illness.  Slowly improving while inpatient. ?--Continue PT/OT efforts, will need SNF placement; difficult placement per TOC but may be able to continue gradual improvement inpatient for possible transition home with home health at some point. ? ?Urinary retention ?Foley catheter discontinued on 08/09/2021 with no current signs of urinary retention. ?--Continue bethanechol ?--Continue bladder scan as needed ?--Continue monitor urinary output ? ?Protein-calorie malnutrition, severe ([place]) ?As evidenced by poor p.o. intake, significant muscle mass and subcu fat loss and significant weight loss (about 16 pounds since this hospitalization). ?Nutrition Problem: Severe Malnutrition (in the context of social/environmental circumstances) ?Etiology:  (inadequate energy intake) ?Signs/Symptoms: mild fat depletion, severe muscle depletion, severe muscle depletion ?Interventions: Refer to RD note for recommendations  ?--Dietitian following, appreciate assistance.   ?--s/p IR gtube 3/6.  ?--Now cleared for dysphagia 3 diet ?--Continue tube feeds ?--continue SLP efforts ? ?Hyperglycemia-resolved as of 07/12/2021 ?Likely due to steroid.  Resolved.  A1c 5.2%. ? ? ? ?DVT prophylaxis: SCD's Start: 06/29/21 1231 ?apixaban (ELIQUIS) tablet 5 mg  ?  Code Status: Full Code ?Family Communication: No family present at bedside this morning ? ?Disposition Plan:  ?Level of care: Progressive ?Status is: Inpatient ? ?Remains inpatient appropriate because: Awaiting SNF placement ? ?  ? ? ?Procedures:  ?ACDF C3-7, neurosurgery Dr. Trenton Gammon  2/3 ?Hematoma evacuation 2/7 ?Vascular duplex ultrasound bilateral lower extremities 2/19 ?TTE 2/20 ?TTE 3/1 ?IR G-tube placement 3/6, 3/24 ? ?Antimicrobials:  ?Vancomycin  2/7 - 2/8; 2/18 - 2/21 ?Zosyn 2/21 -2/23 ?Ampicillin 2/7 -2/11 ?Metronidazole 2/7 - 2/8;

## 2021-08-23 DIAGNOSIS — G959 Disease of spinal cord, unspecified: Secondary | ICD-10-CM | POA: Diagnosis not present

## 2021-08-23 LAB — GLUCOSE, CAPILLARY
Glucose-Capillary: 101 mg/dL — ABNORMAL HIGH (ref 70–99)
Glucose-Capillary: 105 mg/dL — ABNORMAL HIGH (ref 70–99)
Glucose-Capillary: 108 mg/dL — ABNORMAL HIGH (ref 70–99)
Glucose-Capillary: 108 mg/dL — ABNORMAL HIGH (ref 70–99)
Glucose-Capillary: 125 mg/dL — ABNORMAL HIGH (ref 70–99)
Glucose-Capillary: 97 mg/dL (ref 70–99)

## 2021-08-23 MED ORDER — FREE WATER
200.0000 mL | Freq: Four times a day (QID) | Status: DC
  Administered 2021-08-23 – 2021-08-26 (×13): 200 mL

## 2021-08-23 MED ORDER — PROSOURCE TF PO LIQD
45.0000 mL | Freq: Three times a day (TID) | ORAL | Status: DC
  Administered 2021-08-23 – 2021-08-26 (×10): 45 mL
  Filled 2021-08-23 (×10): qty 45

## 2021-08-23 MED ORDER — OSMOLITE 1.5 CAL PO LIQD
474.0000 mL | Freq: Three times a day (TID) | ORAL | Status: DC
  Administered 2021-08-24 – 2021-08-26 (×9): 474 mL
  Filled 2021-08-23 (×2): qty 474

## 2021-08-23 NOTE — Progress Notes (Signed)
Speech Language Pathology Treatment: Dysphagia  ?Patient Details ?Name: Brian [name] ?MRN: [id number] ?DOB: September 19, 1960 ?Today's Date: 08/23/2021 ?Time: [id number] ?SLP Time Calculation (min) (ACUTE ONLY): 23 min ? ?Assessment / Plan / Recommendation ?Clinical Impression ? Dysphagia treatment session during lunch meal. He was able to self feed with occasional scooping assist from therapist (prefers to not use the adaptive equipment for utensil). He consumed Dys 3 texture of pot roast and nectar thickened juice. Effortful swallows noted with liquids and cues to slow pace with straw to allow 1-2 sips at a time and pause between liquid intervals. He did not cough or throat clear this session and is making progress towards his goals. Continue tx ?  ?HPI HPI: Brian [name] is a 61 y/o admitted 06/29/21 following C3-7 ACDF after progressive cervical myelopathy symptoms with subsequent fall and severe incomplete SCI. Significant prevertebral swelling per imaging. Brian [name] developed some difficulty swallowing on 2/5 and SLP consulted. MBS 2/6 revealed severe edema s/p ACDF, oral holding, a pharyngeal delay, inconsistent hyolaryngeal elevation, incomplete epiglottic inversion due to edema. Aspiration noted accross consistencies and and NPO status was recommended. Repeat MBS 2/10 showed significantly reduced pharyngeal edema and a dysphagia 2 diet with nectar thick liquids was initiated. Brian [name] transitioned to thin liquids 2/16 and susbequently returned to nectar thick 2/17 due to concern for aspiration. Brian [name] developed pna, possible aspiration, requiring intubation 2/19-26. Cortrak placed 2/20. PEG placed in IR on 3/6. No significant PMH. This is Brian [name]'s 3rd MBS ?  ?   ?SLP Plan ? Continue with current plan of care ? ?  ?  ?Recommendations for follow up therapy are one component of a multi-disciplinary discharge planning process, led by the attending physician.  Recommendations may be updated based on patient status, additional functional criteria and  insurance authorization. ?  ? ?Recommendations  ?Diet recommendations: Dysphagia 3 (mechanical soft);Nectar-thick liquid ?Liquids provided via: Cup;Straw ?Medication Administration: Via alternative means ?Supervision: Patient able to self feed;Staff to assist with self feeding;Full supervision/cueing for compensatory strategies ?Compensations: Slow rate;Small sips/bites;Clear throat intermittently ?Postural Changes and/or Swallow Maneuvers: Seated upright 90 degrees  ?   ?    ?   ? ? ? ? Oral Care Recommendations: Oral care BID ?Follow Up Recommendations: Skilled nursing-short term rehab (<3 hours/day) ?Assistance recommended at discharge: Frequent or constant Supervision/Assistance ?SLP Visit Diagnosis: Dysphagia, oropharyngeal phase (R13.12) ?Plan: Continue with current plan of care ? ? ? ? ?  ?  ? ? ?Houston Siren ? ?08/23/2021, 3:06 PM ?

## 2021-08-23 NOTE — Progress Notes (Signed)
?PROGRESS NOTE ? ? ? ?Brian [name]  [id number] DOB: July 14, 1960 DOA: 06/29/2021 ?PCP: Pcp, No  ? ? 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  He was admitted on 2/3 and underwent ACDF of C3-4, C4-5, C5-5, and C6-7.  Post-operatively he was progressing slowly with residual weakness in both hands but improving lower extremity strength and function.  He developed some difficulty swallowing on 2/5 and SLP ordered and placed on thickened liquids.   Overnight 2/6, patient with increased difficulty swallowing and was made NPO but also noted to have dysarthria and difficulty moving extremities with numbness.  SLP evaluated and found to be moderate aspiration risk.   Also progressively tachycardic, tachypneic, and now febrile.  Code stroke activated and taken for CT/ CTA head and neck and was given decadron 10mg  once.  NIHSS 18.  CTH was negative for acute findings, and CTA head neck did not reveal any LVO but noted significant prevertebral soft tissue swelling with foci of gas present at the ventral epidural space at C4-5, small collection not excluded. On 2/7, patient with worsening confusion and concern for airway involvement, PCCM consulted and remained in the intensive care unit until he is transferred to the floor on 3/2 and PCCM requested transition to [facility] for medical assistance while patient remains under the neurosurgery service. ? ?Significant Hospital Events: ?2/3 ACDF C3-4, C4-5, C5-5, and C6-7 w/ Dr. Annette Stable ?2/6 SLP eval for difficulty swallowing ?2/7 Code stroke overnight, neg for LVO, CT showing soft tissue swelling.  PCCM consulted for concern of airway  management and AMS.  Went for evacuation of hematoma  ?2/18 CT Abd/Pelvis: showing bilateral segmental Pes, started on heparin and showing worsening pneumonia, febrile to  103.  PCT rising, 34.6, restarted on abx ?2/19 possible aspiration w/ severe hypoxia; intubated; Switching from heparin to angiomax given subtherapeutic levels despite increase in rate. ?2/20 Echo shows normal LV systolic function. There was notation of a + McConnell's sign c/w a large pulmonary embolus which is consistent with the finding of bilateral segmental pulmonary emboli noted on CT 07/14/2021. ?2/21 rash appreciated on back; stopped cefepim/vanc switched to zosyn; required low dose levo with increase in fentanyl ?2/22 remains intubated; unable to extubate to do increase RR and thick secretions ?2/23 [name] infusion added ?2/24: weaning fentanyl.  ?2/26 extubated ?2/28 tachycardia persists ?3/2: Modified barium swallow, continues n.p.o. with core track in place, likely will need PEG tube. ?3/6: s/p IR G-tube placement ?3/16: Foley catheter discontinued ?3/24: E. coli UTI/bacteremia>> cefazolin x7 days per ID ?Now medically clear awaiting SNF placement. ?  ? ?  ?Assessment and Plan: ?* Cervical myelopathy ([place]); severe cervical spinal stenosis with cord compression s/p ACDF [id number] on 2/3, complicated by epidural hematoma s/p evacuation 2/7 ?Patient initially presented s/p recent fall with progressive upper and lower extremity weakness and tremors, He was found to have critical multilevel cervical spinal stenosis @ C3-3 6 with spinal cord compression and spinal cord signal change and underwent ACDF C3-4, C4-5, C5-5, and C6/7 by neurosurgery Dr. Trenton Gammon on 06/29/2021.  Postoperative leg complicated by epidural hematoma s/p evacuation on 2/7. ?--Continue PT/OT/SLP efforts, Awaiting SNF ?--Further as per neurosurgery recommendation. ? ?Acute respiratory failure with hypoxia (  [place]) ?Likely Multifactorial with significant dysphagia and recurrent  aspiration pneumonia events during initial hospitalization following ACDF surgery with postoperative complications of postoperative cervical hematoma.  Patient did require ventilatory support in the intensive care unit and was successfully extubated on 07/22/2021.  Patient completed extensive course of empiric antibiotics.  Oxygen now weaned off, Now on room air. ?--Continue Aspiration precautions ? ? ? ? ?Acute pulmonary embolism ([place]) ?CTA chest showed bilateral segmental PE.  LE [place] negative for DVT.  TTE with normal LV systolic function.  Supplemental oxygen weaned off. ?--Eliquis 5mg  BID ? ?Acute metabolic encephalopathy ?CT head, MRI brain, EEG, TSH, B12, ammonia unrevealing. Etiology likely multifactorial with acute respiratory failure secondary to aspiration pneumonia, bilateral pulmonary embolism.  Completed course of antibiotics for aspiration pneumonia.  Seroquel and clonazepam discontinued. ?--Continues on cefazolin as below for E. coli UTI/septicemia ? ?E. coli bacteremia ?Urine culture and blood cultures x2 08/17/2028 positive for E. coli.  Evaluated by infectious disease. ?--Continue cefazolin 2 g IV every 8 hours x7 days per ID recommendation (End date 4/1) ? ?Severe sepsis due to recurrent aspiration pneumonia ?Likely recurrent issue during hospitalization, remains n.p.o. due to continued aspiration risk per SLP.  Completed extensive course of antibiotics while under ICU care. S/p IR gtube 3/6. ?--Continue tube feeds, transition to bolus feeds today ?--cleared for dysphagia 3 mechanical soft diet with thin liquids per SLP; but with very little oral intake ?--Continue aspiration precautions ?--Continue SLP efforts while inpatient ? ?Sinus tachycardia ?Multifactorial including sepsis, dehydration, PE, agitation.   ?--Continue metoprolol 100 mg twice daily ? ? ? ?Elevated liver enzymes ?Etiology likely secondary to sepsis from aspiration pneumonia as above.  Acute hepatitis panel negative.  RUQ ultrasound  unremarkable. ?--Continue monitor LFTs intermittently ? ?Hyponatremia ?Etiology likely secondary to dehydration.  Urine sodium low.  Continue tube feeds; now cleared by speech therapy for dysphagia 3 diet.  Sodium now up to 136 as of 08/20/2021. ? ? ?Tobacco use disorder ?Encouraged cessation. ?--Nicotine patch; decreased to 7 mg on 3/15 ? ?Physical deconditioning ?Significant weakness in all extremities as a result of cervical myelopathy and acute illness.  Slowly improving while inpatient. ?--Continue PT/OT efforts, will need SNF placement; difficult placement per TOC  ? ?Urinary retention ?Foley catheter discontinued on 08/09/2021 with no current signs of urinary retention. ?--Continue bethanechol ?--Continue bladder scan as needed ?--Continue monitor urinary output ? ?Protein-calorie malnutrition, severe ([place]) ?As evidenced by poor p.o. intake, significant muscle mass and subcu fat loss and significant weight loss (about 16 pounds since this hospitalization). ?Nutrition Problem: Severe Malnutrition (in the context of social/environmental circumstances) ?Etiology:  (inadequate energy intake) ?Signs/Symptoms: mild fat depletion, severe muscle depletion, severe muscle depletion ?Interventions: Refer to RD note for recommendations  ?--Dietitian following, appreciate assistance.   ?--s/p IR gtube 3/6.  ?--Now cleared for dysphagia 3 diet ?--Continue tube feeds; transitioning to bolus feeds 3/30 ?--continue SLP efforts ? ?Hyperglycemia-resolved as of 07/12/2021 ?Likely due to steroid.  Resolved.  A1c 5.2%. ? ? ? ?DVT prophylaxis: SCD's Start: 06/29/21 1231 ?apixaban (ELIQUIS) tablet 5 mg  ?  Code Status: Full Code ?Family Communication: No family present at bedside this morning ? ?Disposition Plan:  ?Level of care: Progressive ?Status is: Inpatient ? ?Remains inpatient appropriate because: Awaiting SNF placement ? ?  ? ? ?Procedures:  ?ACDF C3-7, neurosurgery Dr. Trenton Gammon 2/3 ?Hematoma evacuation 2/7 ?Vascular duplex  ultrasound bilateral lower extremities 2/19 ?TTE 2/20 ?TTE 3/1 ?IR G-tube placement 3/6, 3/24 ? ?Antimicrobials:  ?Vancomycin 2/7 - 2/8; 2/18 -  2/21 ?Zosyn 2/21 -2/23 ?Ampicillin 2/7 -2/11 ?Metronidazole 2/7 - 2/8; 2/17

## 2021-08-23 NOTE — Progress Notes (Signed)
Per Dietician, continuous tube feed canceled to transition to bolus feeding tomorrow. Will allow current liter started at 1400 to infuse to completion and pass along to the oncoming shift new orders. ?

## 2021-08-23 NOTE — Progress Notes (Signed)
Nutrition Follow-up ? ?DOCUMENTATION CODES:  ?Severe malnutrition in context of social or environmental circumstances, Underweight ? ?INTERVENTION:  ?Continue to meet 100% of pt's nutrition needs via TF via PEG: ?Transition to bolus TF regimen ? -Provide 2 cartons (429m) Osmolite 1.5 TID -- be sure to allow to infuse to gravity ?  Flush with 636mfree water before and after each bolus (provides additional 36082mree water) ? -38m76mosource TF TID ?-Provide additional 200ml68me water flush QID (provides additional 800ml 48m water) ? ?This provides 2250 kcals, 122 grams of protein and 1086 mL of free water (2246ml t5m free water with flushes) ? ?NUTRITION DIAGNOSIS:  ?Severe Malnutrition related to social / environmental circumstances as evidenced by severe muscle depletion, severe fat depletion. -- ongoing  ? ?GOAL:  ?Patient will meet greater than or equal to 90% of their needs -- met with TF at goal ? ?MONITOR:  ?TF tolerance ? ?REASON FOR ASSESSMENT:  ?Consult ?Assessment of nutrition requirement/status, Enteral/tube feeding initiation and management ? ?ASSESSMENT:  ?Pt with hx EtOH abuse, tobacco use, and spinal stenosis initially presented 2/3 for planned multilevel anterior cervical decompression and fusion surgery after experiencing progressive bilateral upper and lower extremity weakness and spasticity due to critical multilevel cervical spinal stenosis with spinal cord compression and signal change. ? ?02/03 - s/p anterior cervical discectomy with interbody fusion  ?02/05 - pt initially complained of worsening swallowing function ?02/06 - MBS, NPO per SLP ?02/07 - Code Stroke activated (negative) but transferred to ICU, s/p re-exploration anterior cervical fusion with evacuation of epidural hematoma ?02/08 - Cortrak tube placed (tip gastric), tube feeds initiated ?02/10 - MBS, diet advanced to dysphagia 2 with nectar-thick liquids, Cortrak removed ?02/11 - NPO ?02/13 - diet advanced to dysphagia 2 with  nectar-thick liquids ?02/16 - diet advanced to dysphagia 2 with thin liquids ?02/19- Intubated after desaturation, possibly from PO intake ?02/20 - s/p cortrak tube; post pyloric  ?02/22 - TF off, golytely given ?02/23 - restarted and advancing TF ?02/26 - Extubated ?03/02 - failed MBS ?03/06 - PEG placed ?03/23 - diet advanced to dysphagia 2 with nectar thick liquids s/p MBSS ?03/24 - PEG replaced ?03/25 - diet advanced to dysphagia 2 with nectar thick liquids ?03/27 - diet downgraded to dysphagia 1 with nectar thick liquids ?03/28 - diet advanced to dysphagia 3 with thin liquids ?03/30 - diet downgraded to dysphagia 3 with nectar thick liquids ? ?Discussed pt with MD and SLP. Pt has been tolerating TF via PEG -- current regimen is Osmolite 1.5 @ 60ml/hr41m38ml Pro31mce TF daily and free water flushes. Pt has also been upgraded to dysphagia 3 diet as of today. Discussed options and nutrition plan of care with pt in detail and pt reports that he is not a "big eater" and never has been. Pt expresses desire to have all nutrition needs met via tube feeding as he does not get enjoyment from food/eating and feels he would struggle to maintain his current nutrition status, let alone improve his nutrient stores. MD and SLP informed of pt's preferences and agreeable to plan. Will transition pt to bolus TF and update pt's son as MD notes pt is ready for SNF placement.  ? ?PO Intake: 0-100% x last 8 recorded meals (30% avg meal intake) ? ?UOP: 1800ml x24 55ms ?I/O: -3437ml since60mit ? ?Will increase calorie/protein recommendations and amount being provided by TF as pt's weight is somewhat trending up, though not as consistently as would be preferred.  ? ?  Admit wt 61.7 kg ?Current wt 50.7 kg ? ?Medications: ? feeding supplement (PROSource TF)  45 mL Per Tube Daily  ? folic acid  1 mg Per Tube Daily  ? free water  100 mL Per Tube Q4H  ? multivitamin with minerals  1 tablet Per Tube Daily  ? thiamine  100 mg Per Tube Daily   ? ?Labs: ?Recent Labs  ?Lab 08/18/21 ?1026 08/19/21 ?0813 08/20/21 ?7847  ?NA 134* 133* 136  ?K 3.7 3.9 3.7  ?CL 102 102 105  ?CO2 _0 ?BUN _1 ?CREATININE 0.72 0.61 0.58*  ?CALCIUM 9.1 9.4 9.6  ?GLUCOSE 131* 119* 115*  ?CBGs: 101-134 x 24 hours ? ?Diet Order:   ?Diet Order   ? ?       ?  DIET DYS 3 Room service appropriate? Yes; Fluid consistency: Nectar Thick  Diet effective now       ?  ? ?  ?  ? ?  ? ?EDUCATION NEEDS:  ?Education needs have been addressed ? ?Skin:  Skin Assessment: Skin Integrity Issues: ?Skin Integrity Issues:: Other (Comment) ?Incisions: closed neck ?Other: MASD buttocks ? ?Last BM:  3/30 type 7 ? ?Height:  ?Ht Readings from Last 1 Encounters:  ?07/15/21 _2  (1.778 m)  ? ?Weight:  ?Wt Readings from Last 1 Encounters:  ?08/23/21 50.7 kg  ? ?Ideal Body Weight:  78.2 kg ? ?BMI:  Body mass index is 16.04 kg/m?. ? ?Estimated Nutritional Needs:  ?Kcal:  2100-2300 ?Protein:  105-125 grams ?Fluid:  >2L/day ? ? ? ?Theone Stanley., MS, RD, LDN (she/her/hers) ?RD pager number and weekend/on-call pager number located in [place]. ? ? ?

## 2021-08-23 NOTE — Progress Notes (Signed)
? ?  Providing Compassionate, Quality Care - Together ? ? ?Subjective: ?Patient reports no acute events overnight. He tells me he is ready to go to a facility where he can get therapy daily. ? ?Objective: ?Vital signs in last 24 hours: ?Temp:  [98 ?F (36.7 ?C)-98.8 ?F (37.1 ?C)] 98 ?F (36.7 ?C) (03/30 0754) ?Pulse Rate:  [78-89] 78 (03/30 0754) ?Resp:  [15-20] 16 (03/30 0754) ?BP: (108-132)/(77-100) 111/77 (03/30 0754) ?SpO2:  [100 %] 100 % (03/30 0754) ?Weight:  [50.7 kg] 50.7 kg (03/30 0500) ? ?Intake/Output from previous day: ?03/29 0701 - 03/30 0700 ?In: 3704.1 [P.O.:240; I.V.:1844.1; NG/GT:1020; IV Piggyback:500] ?Out: 1800 [Urine:1800] ?Intake/Output this shift: ?No intake/output data recorded. ? ?Alert; oriented to person, place, and time ?MAE, LLE >RLE ?Decreased fine motor BUE ?Incision is healing well ? ?Lab Results: ?No results for input(s): WBC, HGB, HCT, PLT in the last 72 hours. ?BMET ?No results for input(s): NA, K, CL, CO2, GLUCOSE, BUN, CREATININE, CALCIUM in the last 72 hours. ? ?Studies/Results: ?No results found. ? ?Assessment/Plan: ?Patient status post C3-4, C4-5, C5-6, C6-7 anterior cervical discectomy with interbody fusion by Dr. Annette Stable on 06/29/2021. Increased difficulty swallowing with lung atelectasis and elevated temperatures on 07/02/2021. Made NPO following MBS by SLP. 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 CTA head and neck negative for stroke. MRI revealed epidural hematoma. Patient underwent exploration of his cervical fusion with evacuation of the epidural hematoma on 07/03/2021. Cortrak was placed on 07/04/2021. Patient's strength much improved since epidural hematoma evacuation. Cortrak removed 07/06/2021 and dysphagia 2 diet with nectar thick liquids started. Patient with delirium vs ETOH withdrawal. Discontinued steroids 07/06/2021. CIWA protocol started and discontinued 07/07/2021. CT and MRI 07/07/2021 negative. Patient developed  respiratory distress, requiring intubation on 07/15/2021. Work up revealed aspiration PNA and PE. Patient was extubated on 07/22/2021. Foley catheter removed 07/24/2021. Patient transferred to 3W on 07/26/2021. Foley replaced 07/28/2021. PEG placed 07/30/2021. Foley removed 08/09/2021. PEG pulled out by patient 08/16/2021. Delirium worsening 08/17/2021. Haldol administered. IR replaced PEG on 08/17/2021. Patient with urosepsis 08/18/2021. Culture grew E. Coli. Patient is much more oriented today. ? ? LOS: 55 days  ? ?-Continue efforts at rehabilitation. ?-Goal is for SNF placement. ?  ? ? ?Viona Gilmore, DNP, AGNP-C ?Nurse Practitioner ? ?[facility] ?[address], [address], [place], [place] [address] ?[id number]    [id number] ? ?08/23/2021, 10:58 AM ? ? ? ? ?

## 2021-08-23 NOTE — Plan of Care (Signed)
Pt alert and oriented x 4. Pt med compliant. Vitals stable. Pt got back in bed with stand pivot. Dsg changed to gtube and site cleaned. New tubing placed on IV lines and Tube feedings. Weakness and drift in bilateral legs and decreased sensation to extremities.  ?Problem: Education: ?Goal: Ability to verbalize activity precautions or restrictions will improve ?Outcome: Progressing ?Goal: Knowledge of the prescribed therapeutic regimen will improve ?Outcome: Progressing ?Goal: Understanding of discharge needs will improve ?Outcome: Progressing ?  ?Problem: Activity: ?Goal: Ability to avoid complications of mobility impairment will improve ?Outcome: Progressing ?Goal: Ability to tolerate increased activity will improve ?Outcome: Progressing ?Goal: Will remain free from falls ?Outcome: Progressing ?  ?Problem: Bowel/Gastric: ?Goal: Gastrointestinal status for postoperative course will improve ?Outcome: Progressing ?  ?Problem: Clinical Measurements: ?Goal: Ability to maintain clinical measurements within normal limits will improve ?Outcome: Progressing ?Goal: Postoperative complications will be avoided or minimized ?Outcome: Progressing ?Goal: Diagnostic test results will improve ?Outcome: Progressing ?  ?Problem: Pain Management: ?Goal: Pain level will decrease ?Outcome: Progressing ?  ?Problem: Skin Integrity: ?Goal: Will show signs of wound healing ?Outcome: Progressing ?  ?Problem: Health Behavior/Discharge Planning: ?Goal: Identification of resources available to assist in meeting health care needs will improve ?Outcome: Progressing ?  ?Problem: Bladder/Genitourinary: ?Goal: Urinary functional status for postoperative course will improve ?Outcome: Progressing ?  ?Problem: Safety: ?Goal: Ability to remain free from injury will improve ?Outcome: Progressing ?  ?Problem: Education: ?Goal: Knowledge of General Education information will improve ?Description: Including pain rating scale, medication(s)/side effects and  non-pharmacologic comfort measures ?Outcome: Progressing ?  ?Problem: Health Behavior/Discharge Planning: ?Goal: Ability to manage health-related needs will improve ?Outcome: Progressing ?  ?Problem: Clinical Measurements: ?Goal: Ability to maintain clinical measurements within normal limits will improve ?Outcome: Progressing ?Goal: Will remain free from infection ?Outcome: Progressing ?Goal: Diagnostic test results will improve ?Outcome: Progressing ?Goal: Respiratory complications will improve ?Outcome: Progressing ?Goal: Cardiovascular complication will be avoided ?Outcome: Progressing ?  ?Problem: Activity: ?Goal: Risk for activity intolerance will decrease ?Outcome: Progressing ?  ?Problem: Nutrition: ?Goal: Adequate nutrition will be maintained ?Outcome: Progressing ?  ?Problem: Coping: ?Goal: Level of anxiety will decrease ?Outcome: Progressing ?  ?Problem: Elimination: ?Goal: Will not experience complications related to bowel motility ?Outcome: Progressing ?Goal: Will not experience complications related to urinary retention ?Outcome: Progressing ?  ?Problem: Pain Managment: ?Goal: General experience of comfort will improve ?Outcome: Progressing ?  ?Problem: Safety: ?Goal: Ability to remain free from injury will improve ?Outcome: Progressing ?  ?Problem: Skin Integrity: ?Goal: Risk for impaired skin integrity will decrease ?Outcome: Progressing ?  ?Problem: Safety: ?Goal: Non-violent Restraint(s) ?Outcome: Progressing ?  ?

## 2021-08-24 DIAGNOSIS — G959 Disease of spinal cord, unspecified: Secondary | ICD-10-CM | POA: Diagnosis not present

## 2021-08-24 LAB — GLUCOSE, CAPILLARY
Glucose-Capillary: 126 mg/dL — ABNORMAL HIGH (ref 70–99)
Glucose-Capillary: 102 mg/dL — ABNORMAL HIGH (ref 70–99)
Glucose-Capillary: 119 mg/dL — ABNORMAL HIGH (ref 70–99)
Glucose-Capillary: 116 mg/dL — ABNORMAL HIGH (ref 70–99)
Glucose-Capillary: 115 mg/dL — ABNORMAL HIGH (ref 70–99)

## 2021-08-24 NOTE — Progress Notes (Signed)
? ?  Providing Compassionate, Quality Care - Together ? ? ?Subjective: ?Patient is up to chair. He is ready to go to a rehabilitation facility. ? ?Objective: ?Vital signs in last 24 hours: ?Temp:  [97.6 ?F (36.4 ?C)-98.7 ?F (37.1 ?C)] 97.9 ?F (36.6 ?C) (03/31 1142) ?Pulse Rate:  [73-89] 73 (03/31 1142) ?Resp:  [13-18] 16 (03/31 1142) ?BP: (107-119)/(68-89) 107/68 (03/31 1142) ?SpO2:  [100 %] 100 % (03/31 1142) ? ?Intake/Output from previous day: ?03/30 0701 - 03/31 0700 ?In: [id number] [P.O.:360; NG/GT:1176; IV [id number] ?Out: 2500 [Urine:2500] ?Intake/Output this shift: ?Total I/O ?In: 0  ?Out: 1200 [Urine:1200] ? ?Alert; oriented to person, place, and time ?MAE, LLE >RLE ?Decreased fine motor BUE ?Incision is healing well ?  ? ?Lab Results: ?No results for input(s): WBC, HGB, HCT, PLT in the last 72 hours. ?BMET ?No results for input(s): NA, K, CL, CO2, GLUCOSE, BUN, CREATININE, CALCIUM in the last 72 hours. ? ?Studies/Results: ?No results found. ? ?Assessment/Plan: ?Patient status post C3-4, C4-5, C5-6, C6-7 anterior cervical discectomy with interbody fusion by Dr. Annette Stable on 06/29/2021. Increased difficulty swallowing with lung atelectasis and elevated temperatures on 07/02/2021. Made NPO following MBS by SLP. 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 CTA head and neck negative for stroke. MRI revealed epidural hematoma. Patient underwent exploration of his cervical fusion with evacuation of the epidural hematoma on 07/03/2021. Cortrak was placed on 07/04/2021. Patient's strength much improved since epidural hematoma evacuation. Cortrak removed 07/06/2021 and dysphagia 2 diet with nectar thick liquids started. Patient with delirium vs ETOH withdrawal. Discontinued steroids 07/06/2021. CIWA protocol started and discontinued 07/07/2021. CT and MRI 07/07/2021 negative. Patient developed respiratory distress, requiring intubation on 07/15/2021. Work up revealed aspiration PNA and PE.  Patient was extubated on 07/22/2021. Foley catheter removed 07/24/2021. Patient transferred to 3W on 07/26/2021. Foley replaced 07/28/2021. PEG placed 07/30/2021. Foley removed 08/09/2021. PEG pulled out by patient 08/16/2021. Delirium worsening 08/17/2021. Haldol administered. IR replaced PEG on 08/17/2021. Patient with urosepsis 08/18/2021. Culture grew E. Coli. ? ? ? LOS: 56 days  ? ? ?-Continue efforts at rehabilitation. ?-Goal is for SNF placement. ? ? ?Viona Gilmore, DNP, AGNP-C ?Nurse Practitioner ? ?[facility] ?[address], [place], [place] [address] ?[id number]    [id number]:[id number] ? ?08/24/2021, 2:36 PM ? ? ? ? ?

## 2021-08-24 NOTE — Progress Notes (Signed)
?PROGRESS NOTE ? ? ? ?Brian [name]  [id number] DOB: 1961/04/22 DOA: 06/29/2021 ?PCP: Pcp, No  ? ? 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  He was admitted on 2/3 and underwent ACDF of C3-4, C4-5, C5-5, and C6-7.  Post-operatively he was progressing slowly with residual weakness in both hands but improving lower extremity strength and function.  He developed some difficulty swallowing on 2/5 and SLP ordered and placed on thickened liquids.   Overnight 2/6, patient with increased difficulty swallowing and was made NPO but also noted to have dysarthria and difficulty moving extremities with numbness.  SLP evaluated and found to be moderate aspiration risk.   Also progressively tachycardic, tachypneic, and now febrile.  Code stroke activated and taken for CT/ CTA head and neck and was given decadron 10mg  once.  NIHSS 18.  CTH was negative for acute findings, and CTA head neck did not reveal any LVO but noted significant prevertebral soft tissue swelling with foci of gas present at the ventral epidural space at C4-5, small collection not excluded. On 2/7, patient with worsening confusion and concern for airway involvement, PCCM consulted and remained in the intensive care unit until he is transferred to the floor on 3/2 and PCCM requested transition to [facility] for medical assistance while patient remains under the neurosurgery service. ? ?Significant Hospital Events: ?2/3 ACDF C3-4, C4-5, C5-5, and C6-7 w/ Dr. Annette Stable ?2/6 SLP eval for difficulty swallowing ?2/7 Code stroke overnight, neg for LVO, CT showing soft tissue swelling.  PCCM consulted for concern of airway  management and AMS.  Went for evacuation of hematoma  ?2/18 CT Abd/Pelvis: showing bilateral segmental Pes, started on heparin and showing worsening pneumonia, febrile to  103.  PCT rising, 34.6, restarted on abx ?2/19 possible aspiration w/ severe hypoxia; intubated; Switching from heparin to angiomax given subtherapeutic levels despite increase in rate. ?2/20 Echo shows normal LV systolic function. There was notation of a + McConnell's sign c/w a large pulmonary embolus which is consistent with the finding of bilateral segmental pulmonary emboli noted on CT 07/14/2021. ?2/21 rash appreciated on back; stopped cefepim/vanc switched to zosyn; required low dose levo with increase in fentanyl ?2/22 remains intubated; unable to extubate to do increase RR and thick secretions ?2/23 [name] infusion added ?2/24: weaning fentanyl.  ?2/26 extubated ?2/28 tachycardia persists ?3/2: Modified barium swallow, continues n.p.o. with core track in place, likely will need PEG tube. ?3/6: s/p IR G-tube placement ?3/16: Foley catheter discontinued ?3/24: E. coli UTI/bacteremia>> cefazolin x7 days per ID ?Now medically clear awaiting SNF placement. ?  ? ?  ?Assessment and Plan: ?* Cervical myelopathy ([place]); severe cervical spinal stenosis with cord compression s/p ACDF [id number] on 2/3, complicated by epidural hematoma s/p evacuation 2/7 ?Patient initially presented s/p recent fall with progressive upper and lower extremity weakness and tremors, He was found to have critical multilevel cervical spinal stenosis @ C3-3 6 with spinal cord compression and spinal cord signal change and underwent ACDF C3-4, C4-5, C5-5, and C6/7 by neurosurgery Dr. Trenton Gammon on 06/29/2021.  Postoperative leg complicated by epidural hematoma s/p evacuation on 2/7. ?--Continue PT/OT/SLP efforts, Awaiting SNF ?--Further as per neurosurgery recommendation. ? ?Acute respiratory failure with hypoxia (  [place]) ?Likely Multifactorial with significant dysphagia and recurrent  aspiration pneumonia events during initial hospitalization following ACDF surgery with postoperative complications of postoperative cervical hematoma.  Patient did require ventilatory support in the intensive care unit and was successfully extubated on 07/22/2021.  Patient completed extensive course of empiric antibiotics.  Oxygen now weaned off, Now on room air. ?--Continue Aspiration precautions ? ? ? ? ?Acute pulmonary embolism ([place]) ?CTA chest showed bilateral segmental PE.  LE [place] negative for DVT.  TTE with normal LV systolic function.  Supplemental oxygen weaned off. ?--Eliquis 5mg  BID ? ?Acute metabolic encephalopathy ?CT head, MRI brain, EEG, TSH, B12, ammonia unrevealing. Etiology likely multifactorial with acute respiratory failure secondary to aspiration pneumonia, bilateral pulmonary embolism.  Completed course of antibiotics for aspiration pneumonia.  Seroquel and clonazepam discontinued. ?--Continues on cefazolin as below for E. coli UTI/septicemia ? ?E. coli bacteremia ?Urine culture and blood cultures x2 08/17/2028 positive for E. coli.  Evaluated by infectious disease. ?--Continue cefazolin 2 g IV every 8 hours x7 days per ID recommendation (End date 4/1) ? ?Severe sepsis due to recurrent aspiration pneumonia ?Likely recurrent issue during hospitalization, remains n.p.o. due to continued aspiration risk per SLP.  Completed extensive course of antibiotics while under ICU care. S/p IR gtube 3/6. ?--Continue tube feeds, transition to bolus feeds today ?--cleared for dysphagia 3 mechanical soft diet with thin liquids per SLP; but with very little oral intake ?--Continue aspiration precautions ?--Continue SLP efforts while inpatient ? ?Sinus tachycardia ?Multifactorial including sepsis, dehydration, PE, agitation.   ?--Continue metoprolol 100 mg twice daily ? ? ? ?Elevated liver enzymes ?Etiology likely secondary to sepsis from aspiration pneumonia as above.  Acute hepatitis panel negative.  RUQ ultrasound  unremarkable. ?--Continue monitor LFTs intermittently ? ?Hyponatremia ?Etiology likely secondary to dehydration.  Urine sodium low.  Continue tube feeds; now cleared by speech therapy for dysphagia 3 diet.  Sodium now up to 136 as of 08/20/2021. ? ? ?Tobacco use disorder ?Encouraged cessation. ?--Nicotine patch; decreased to 7 mg on 3/15 ? ?Physical deconditioning ?Significant weakness in all extremities as a result of cervical myelopathy and acute illness.  Slowly improving while inpatient. ?--Continue PT/OT efforts, will need SNF placement; difficult placement per TOC  ? ?Urinary retention ?Foley catheter discontinued on 08/09/2021 with no current signs of urinary retention. ?--Continue bethanechol ?--Continue bladder scan as needed ?--Continue monitor urinary output ? ?Protein-calorie malnutrition, severe ([place]) ?As evidenced by poor p.o. intake, significant muscle mass and subcu fat loss and significant weight loss (about 16 pounds since this hospitalization). ?Nutrition Problem: Severe Malnutrition (in the context of social/environmental circumstances) ?Etiology:  (inadequate energy intake) ?Signs/Symptoms: mild fat depletion, severe muscle depletion, severe muscle depletion ?Interventions: Refer to RD note for recommendations  ?--Dietitian following, appreciate assistance.   ?--s/p IR gtube 3/6.  ?--Now cleared for dysphagia 3 diet ?--Continue tube feeds; transitioning to bolus feeds 3/30 ?--continue SLP efforts ? ?Hyperglycemia-resolved as of 07/12/2021 ?Likely due to steroid.  Resolved.  A1c 5.2%. ? ? ? ?DVT prophylaxis: SCD's Start: 06/29/21 1231 ?apixaban (ELIQUIS) tablet 5 mg  ?  Code Status: Full Code ?Family Communication: No family present at bedside this morning ? ?Disposition Plan:  ?Level of care: Progressive ?Status is: Inpatient ? ?Remains inpatient appropriate because: Awaiting SNF placement ? ?  ? ? ?Procedures:  ?ACDF C3-7, neurosurgery Dr. Trenton Gammon 2/3 ?Hematoma evacuation 2/7 ?Vascular duplex  ultrasound bilateral lower extremities 2/19 ?TTE 2/20 ?TTE 3/1 ?IR G-tube placement 3/6, 3/24 ? ?Antimicrobials:  ?Vancomycin 2/7 - 2/8; 2/18 -  2/21 ?Zosyn 2/21 -2/23 ?Ampicillin 2/7 -2/11 ?Metronidazole 2/7 - 2/8; 2/17

## 2021-08-24 NOTE — Plan of Care (Signed)
?  Problem: Education: ?Goal: Ability to verbalize activity precautions or restrictions will improve ?Outcome: Progressing ?Goal: Knowledge of the prescribed therapeutic regimen will improve ?Outcome: Progressing ?Goal: Understanding of discharge needs will improve ?Outcome: Progressing ?  ?Problem: Activity: ?Goal: Ability to avoid complications of mobility impairment will improve ?Outcome: Progressing ?Goal: Ability to tolerate increased activity will improve ?Outcome: Progressing ?Goal: Will remain free from falls ?Outcome: Progressing ?  ?Problem: Bowel/Gastric: ?Goal: Gastrointestinal status for postoperative course will improve ?Outcome: Progressing ?  ?Problem: Clinical Measurements: ?Goal: Ability to maintain clinical measurements within normal limits will improve ?Outcome: Progressing ?Goal: Postoperative complications will be avoided or minimized ?Outcome: Progressing ?Goal: Diagnostic test results will improve ?Outcome: Progressing ?  ?Problem: Pain Management: ?Goal: Pain level will decrease ?Outcome: Progressing ?  ?Problem: Skin Integrity: ?Goal: Will show signs of wound healing ?Outcome: Progressing ?  ?Problem: Health Behavior/Discharge Planning: ?Goal: Identification of resources available to assist in meeting health care needs will improve ?Outcome: Progressing ?  ?Problem: Bladder/Genitourinary: ?Goal: Urinary functional status for postoperative course will improve ?Outcome: Progressing ?  ?Problem: Safety: ?Goal: Ability to remain free from injury will improve ?Outcome: Progressing ?  ?Problem: Education: ?Goal: Knowledge of General Education information will improve ?Description: Including pain rating scale, medication(s)/side effects and non-pharmacologic comfort measures ?Outcome: Progressing ?  ?Problem: Health Behavior/Discharge Planning: ?Goal: Ability to manage health-related needs will improve ?Outcome: Progressing ?  ?Problem: Clinical Measurements: ?Goal: Ability to maintain clinical  measurements within normal limits will improve ?Outcome: Progressing ?Goal: Will remain free from infection ?Outcome: Progressing ?Goal: Diagnostic test results will improve ?Outcome: Progressing ?Goal: Respiratory complications will improve ?Outcome: Progressing ?Goal: Cardiovascular complication will be avoided ?Outcome: Progressing ?  ?Problem: Activity: ?Goal: Risk for activity intolerance will decrease ?Outcome: Progressing ?  ?Problem: Nutrition: ?Goal: Adequate nutrition will be maintained ?Outcome: Progressing ?  ?Problem: Coping: ?Goal: Level of anxiety will decrease ?Outcome: Progressing ?  ?Problem: Elimination: ?Goal: Will not experience complications related to bowel motility ?Outcome: Progressing ?Goal: Will not experience complications related to urinary retention ?Outcome: Progressing ?  ?Problem: Pain Managment: ?Goal: General experience of comfort will improve ?Outcome: Progressing ?  ?Problem: Safety: ?Goal: Ability to remain free from injury will improve ?Outcome: Progressing ?  ?Problem: Skin Integrity: ?Goal: Risk for impaired skin integrity will decrease ?Outcome: Progressing ?  ?Problem: Safety: ?Goal: Non-violent Restraint(s) ?Outcome: Progressing ?  ?

## 2021-08-24 NOTE — Progress Notes (Signed)
Physical Therapy Treatment ?Patient Details ?Name: Brian [name] ?MRN: [id number] ?DOB: [birth year] ?Today's Date: 08/24/2021 ? ? ?History of Present Illness Pt is a 61 y/o male admitted 06/29/21 following C3-7 ACDF after progressive cervical myelopathy symptoms including ataxia, bil UE and LE shaking and weakness, and loss of function in his hands with subsequent fall. Pt initially had improvement in neurological symptoms, but postoperative course was complicated by dysphasia and ataxia secondary to and epidural hematoma with evacuation of hematoma on 2/7. Course complicated by delirium secondary to encephalopathy, sepsis secondary to aspiration PNA, reintubated 2/19-2/26 for airway protection, bilat segmental PEs 2/18. PEG placed 3/6. PMHx includes tobacco and EtOH use disorder. ? ?  ?PT Comments  ? ? Pt with depressed spirits and strongly desires to return home. Pt gives great effort throughout session and strongly desires to ambulate. Focused on standing and stepping at EOB holding onto bed rail to allow for pt to overcome some ataxia and focus on controlling his LEs. Discussed pt's home set up and if he would be able to use a w/c in his home and he reports it's possible. He has a level entry in the back and reports his son his staying at his house now and that he has a very supportive neighbor. Discussed being indep at w/c level and pt needing 24/7 assist at this time for safe d/c home. Aware pt's insurance covers no rehab in any setting. In the process of evaluating him for our [facility] and validating patients home set up and support. Pt will need a power tilt n space w/c as pt doesn't have the bilat UE dexterity or bilat LE strength to propel a w/c. in addition to a hospital bed if pt were to d/c home. Acute PT to cont to follow. ?  ?Recommendations for follow up therapy are one component of a multi-disciplinary discharge planning process, led by the attending physician.  Recommendations may be updated  based on patient status, additional functional criteria and insurance authorization. ? ?Follow Up Recommendations ? Skilled nursing-short term rehab (<3 hours/day) ?  ?  ?Assistance Recommended at Discharge Frequent or constant Supervision/Assistance  ?Patient can return home with the following Two people to help with walking and/or transfers;A lot of help with bathing/dressing/bathroom;Assistance with cooking/housework;Assistance with feeding;Direct supervision/assist for medications management;Direct supervision/assist for financial management;Assist for transportation;Help with stairs or ramp for entrance ?  ?Equipment Recommendations ? Wheelchair (measurements PT);Wheelchair cushion (measurements PT);Hospital bed  ?  ?Recommendations for Other Services Rehab consult ? ? ?  ?Precautions / Restrictions Precautions ?Precautions: Fall ?Precaution Booklet Issued: Yes (comment) ?Required Braces or Orthoses: Cervical Brace (has been d/c'd) ?Cervical Brace:  (now d/c'd) ?Restrictions ?Weight Bearing Restrictions: No  ?  ? ?Mobility ? Bed Mobility ?Overal bed mobility: Needs Assistance ?Bed Mobility: Rolling, Sidelying to Sit ?Rolling: Min assist ?Sidelying to sit: Mod assist ?  ?  ?  ?General bed mobility comments: with HOB flat pt able to roll to the L with minimal assist, modA for trunk elevation up into sitting ?  ? ?Transfers ?Overall transfer level: Needs assistance ?Equipment used:  (face to face transfer with PT) ?Transfers: Bed to chair/wheelchair/BSC, Sit to/from Stand ?Sit to Stand: Max assist, +2 safety/equipment ?Stand pivot transfers: Mod assist ?  ?  ?  ?  ?General transfer comment: pt with bilat knees in flexion, pivoted on balls of feet ?  ? ?Ambulation/Gait ?  ?  ?  ?  ?  ?  ?  ?  ? ? ?  Stairs ?  ?  ?  ?  ?  ? ? ?Wheelchair Mobility ?  ? ?Modified Rankin (Stroke Patients Only) ?  ? ? ?  ?Balance Overall balance assessment: Needs assistance ?Sitting-balance support: Single extremity supported, Feet  supported ?Sitting balance-Leahy Scale: Fair ?Sitting balance - Comments: one extremity support for sitting balance on EOB ?  ?Standing balance support: Bilateral upper extremity supported ?Standing balance-Leahy Scale: Poor ?Standing balance comment: worked on standing exercises at the edge of bed on bed rail with be elevated ?  ?  ?  ?  ?  ?  ?  ?  ?  ?  ?  ?  ? ?  ?Cognition Arousal/Alertness: Awake/alert ?Behavior During Therapy: Flat affect (depressed spirits) ?Overall Cognitive Status: No family/caregiver present to determine baseline cognitive functioning ?Area of Impairment: Problem solving, Awareness ?  ?  ?  ?  ?  ?  ?  ?  ?  ?  ?  ?Following Commands: Follows one step commands consistently ?  ?Awareness: Intellectual ?Problem Solving: Requires verbal cues, Requires tactile cues, Difficulty sequencing ?General Comments: pt with depressed spirits and strongly desires to return home as he has been here for almost 2 months. pt continues to give great effort during therapy despite depressed spirits ?  ?  ? ?  ?Exercises General Exercises - Lower Extremity ?Ankle Circles/Pumps: Supine, Both, AROM, 10 reps (slow and purposeful) ?Long Arc Quad: AROM, Both, 5 reps ?Heel Slides: Both, 10 reps, AROM, Supine (slow and purposeful trying to not let LE touch opposite LE during the movement) ?Other Exercises ?Other Exercises: completed AA bridges in the bed ?Other Exercises: worked in standing on picking L/R foot up and placing it on an "X" to minimal ataxia when trying to advance LEs, tactile cues at posterior hips, REhab tech assisting pt with keeping hands on bed rail ? ?  ?General Comments General comments (skin integrity, edema, etc.): VSS on RA ?  ?  ? ?Pertinent Vitals/Pain Pain Assessment ?Pain Assessment: Faces ?Faces Pain Scale: Hurts little more ?Pain Location: has occasional back spasms during mobiltiy ?Pain Descriptors / Indicators: Grimacing, Spasm ?Pain Intervention(s): Monitored during session  ? ? ?Home  Living   ?  ?  ?  ?  ?  ?  ?  ?  ?  ?   ?  ?Prior Function    ?  ?  ?   ? ?PT Goals (current goals can now be found in the care plan section) Acute Rehab PT Goals ?PT Goal Formulation: With patient ?Time For Goal Achievement: 09/04/21 ?Potential to Achieve Goals: Fair ?Progress towards PT goals: Progressing toward goals ? ?  ?Frequency ? ? ? Min 3X/week ? ? ? ?  ?PT Plan Current plan remains appropriate;Frequency needs to be updated  ? ? ?Co-evaluation   ?  ?  ?  ?  ? ?  ?AM-PAC PT "6 Clicks" Mobility   ?Outcome Measure ? Help needed turning from your back to your side while in a flat bed without using bedrails?: A Little ?Help needed moving from lying on your back to sitting on the side of a flat bed without using bedrails?: A Little ?Help needed moving to and from a bed to a chair (including a wheelchair)?: A Lot ?Help needed standing up from a chair using your arms (e.g., wheelchair or bedside chair)?: A Lot ?Help needed to walk in hospital room?: Total ?Help needed climbing 3-5 steps with a railing? : Total ?6 Click Score: 12 ? ?  ?  End of Session Equipment Utilized During Treatment: Gait belt ?Activity Tolerance: Patient tolerated treatment well ?Patient left: with call bell/phone within reach;with chair alarm set;in chair ?Nurse Communication: Mobility status ?PT Visit Diagnosis: Muscle weakness (generalized) (M62.81);Other symptoms and signs involving the nervous system (R29.898);Difficulty in walking, not elsewhere classified (R26.2);Other abnormalities of gait and mobility (R26.89) ?  ? ? ?Time: [id number] ?PT Time Calculation (min) (ACUTE ONLY): 39 min ? ?Charges:  $Therapeutic Exercise: 23-37 mins ?$Neuromuscular Re-education: 8-22 mins          ?          ? ?Kittie Plater, PT, DPT ?Acute Rehabilitation Services ?Secure chat preferred ?Office #: [phone] ? ? ? ?Smiley Birr M Kinsly Hild ?08/24/2021, 11:42 AM ? ?

## 2021-08-25 DIAGNOSIS — G959 Disease of spinal cord, unspecified: Secondary | ICD-10-CM | POA: Diagnosis not present

## 2021-08-25 LAB — GLUCOSE, CAPILLARY
Glucose-Capillary: 105 mg/dL — ABNORMAL HIGH (ref 70–99)
Glucose-Capillary: 107 mg/dL — ABNORMAL HIGH (ref 70–99)
Glucose-Capillary: 110 mg/dL — ABNORMAL HIGH (ref 70–99)
Glucose-Capillary: 113 mg/dL — ABNORMAL HIGH (ref 70–99)
Glucose-Capillary: 127 mg/dL — ABNORMAL HIGH (ref 70–99)
Glucose-Capillary: 129 mg/dL — ABNORMAL HIGH (ref 70–99)
Glucose-Capillary: 90 mg/dL (ref 70–99)
Glucose-Capillary: 98 mg/dL (ref 70–99)

## 2021-08-25 NOTE — Plan of Care (Signed)
  Problem: Pain Management: Goal: Pain level will decrease Outcome: Progressing   Problem: Skin Integrity: Goal: Will show signs of wound healing Outcome: Progressing   Problem: Safety: Goal: Ability to remain free from injury will improve Outcome: Progressing   

## 2021-08-25 NOTE — Progress Notes (Signed)
?PROGRESS NOTE ? ? ? ?Brian [name]  [id number] DOB: 1960/12/30 DOA: 06/29/2021 ?PCP: Pcp, No  ? ? 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  He was admitted on 2/3 and underwent ACDF of C3-4, C4-5, C5-5, and C6-7.  Post-operatively he was progressing slowly with residual weakness in both hands but improving lower extremity strength and function.  He developed some difficulty swallowing on 2/5 and SLP ordered and placed on thickened liquids.   Overnight 2/6, patient with increased difficulty swallowing and was made NPO but also noted to have dysarthria and difficulty moving extremities with numbness.  SLP evaluated and found to be moderate aspiration risk.   Also progressively tachycardic, tachypneic, and now febrile.  Code stroke activated and taken for CT/ CTA head and neck and was given decadron 10mg  once.  NIHSS 18.  CTH was negative for acute findings, and CTA head neck did not reveal any LVO but noted significant prevertebral soft tissue swelling with foci of gas present at the ventral epidural space at C4-5, small collection not excluded. On 2/7, patient with worsening confusion and concern for airway involvement, PCCM consulted and remained in the intensive care unit until he is transferred to the floor on 3/2 and PCCM requested transition to [facility] for medical assistance while patient remains under the neurosurgery service. ? ?Significant Hospital Events: ?2/3 ACDF C3-4, C4-5, C5-5, and C6-7 w/ Dr. Annette Stable ?2/6 SLP eval for difficulty swallowing ?2/7 Code stroke overnight, neg for LVO, CT showing soft tissue swelling.  PCCM consulted for concern of airway  management and AMS.  Went for evacuation of hematoma  ?2/18 CT Abd/Pelvis: showing bilateral segmental Pes, started on heparin and showing worsening pneumonia, febrile to  103.  PCT rising, 34.6, restarted on abx ?2/19 possible aspiration w/ severe hypoxia; intubated; Switching from heparin to angiomax given subtherapeutic levels despite increase in rate. ?2/20 Echo shows normal LV systolic function. There was notation of a + McConnell's sign c/w a large pulmonary embolus which is consistent with the finding of bilateral segmental pulmonary emboli noted on CT 07/14/2021. ?2/21 rash appreciated on back; stopped cefepim/vanc switched to zosyn; required low dose levo with increase in fentanyl ?2/22 remains intubated; unable to extubate to do increase RR and thick secretions ?2/23 [name] infusion added ?2/24: weaning fentanyl.  ?2/26 extubated ?2/28 tachycardia persists ?3/2: Modified barium swallow, continues n.p.o. with core track in place, likely will need PEG tube. ?3/6: s/p IR G-tube placement ?3/16: Foley catheter discontinued ?3/24: E. coli UTI/bacteremia>> cefazolin x7 days per ID ?Now medically clear awaiting SNF placement. ?  ? ?  ?Assessment and Plan: ?* Cervical myelopathy ([place]); severe cervical spinal stenosis with cord compression s/p ACDF [id number] on 2/3, complicated by epidural hematoma s/p evacuation 2/7 ?Patient initially presented s/p recent fall with progressive upper and lower extremity weakness and tremors, He was found to have critical multilevel cervical spinal stenosis @ C3-6 with spinal cord compression and spinal cord signal change and underwent ACDF C3-4, C4-5, C5-5, and C6/7 by neurosurgery Dr. Trenton Gammon on 06/29/2021.  Postoperative leg complicated by epidural hematoma s/p evacuation on 2/7. ?--Continue PT/OT/SLP efforts, Awaiting SNF ?--Further as per neurosurgery recommendation. ? ?Acute respiratory failure with hypoxia ([place]) ?  Likely Multifactorial with significant dysphagia and recurrent  aspiration pneumonia events during initial hospitalization following ACDF surgery with postoperative complications of postoperative cervical hematoma.  Patient did require ventilatory support in the intensive care unit and was successfully extubated on 07/22/2021.  Patient completed extensive course of empiric antibiotics.  Oxygen now weaned off, Now on room air. ?--Continue Aspiration precautions ? ? ? ? ?Acute pulmonary embolism ([place]) ?CTA chest showed bilateral segmental PE.  LE [place] negative for DVT.  TTE with normal LV systolic function.  Supplemental oxygen weaned off. ?--Eliquis 5mg  BID ? ?Acute metabolic encephalopathy ?CT head, MRI brain, EEG, TSH, B12, ammonia unrevealing. Etiology likely multifactorial with acute respiratory failure secondary to aspiration pneumonia, bilateral pulmonary embolism.  Completed course of antibiotics for aspiration pneumonia.  Seroquel and clonazepam discontinued. ?--Continues on cefazolin as below for E. coli UTI/septicemia ? ?E. coli bacteremia ?Urine culture and blood cultures x2 08/17/2028 positive for E. coli.  Evaluated by infectious disease. ?--Continue cefazolin 2 g IV every 8 hours x7 days per ID recommendation (End date 4/1) ? ?Severe sepsis due to recurrent aspiration pneumonia ?Likely recurrent issue during hospitalization, remains n.p.o. due to continued aspiration risk per SLP.  Completed extensive course of antibiotics while under ICU care. S/p IR gtube 3/6. ?--Continue tube feeds, transition to bolus feeds 3/31 ?--cleared for dysphagia 3 mechanical soft diet with thin liquids per SLP; but with very little oral intake ?--Continue aspiration precautions ?--Continue SLP efforts while inpatient ? ?Sinus tachycardia ?Multifactorial including sepsis, dehydration, PE, agitation.   ?--Continue metoprolol 100 mg twice daily ? ? ? ?Elevated liver enzymes ?Etiology likely secondary to sepsis from aspiration pneumonia as above.  Acute hepatitis panel negative.  RUQ ultrasound  unremarkable. ?--Continue monitor LFTs intermittently ? ?Hyponatremia ?Etiology likely secondary to dehydration.  Urine sodium low.  Continue tube feeds; now cleared by speech therapy for dysphagia 3 diet.  Sodium now up to 136 as of 08/20/2021. ? ? ?Tobacco use disorder ?Encouraged cessation. ?--Nicotine patch; decreased to 7 mg on 3/15 ? ?Physical deconditioning ?Significant weakness in all extremities as a result of cervical myelopathy and acute illness.  Slowly improving while inpatient. ?--Continue PT/OT efforts, will need SNF placement; difficult placement per TOC  ? ?Urinary retention ?Foley catheter discontinued on 08/09/2021 with no current signs of urinary retention. ?--Continue bethanechol ?--Continue bladder scan as needed ?--Continue monitor urinary output ? ?Protein-calorie malnutrition, severe ([place]) ?As evidenced by poor p.o. intake, significant muscle mass and subcu fat loss and significant weight loss (about 16 pounds since this hospitalization). ?Nutrition Problem: Severe Malnutrition (in the context of social/environmental circumstances) ?Etiology:  (inadequate energy intake) ?Signs/Symptoms: mild fat depletion, severe muscle depletion, severe muscle depletion ?Interventions: Refer to RD note for recommendations  ?--Dietitian following, appreciate assistance.   ?--s/p IR gtube 3/6.  ?--Now cleared for dysphagia 3 diet ?--Continue tube feeds; transitioning to bolus feeds 3/31 ?--continue SLP efforts ? ?Hyperglycemia-resolved as of 07/12/2021 ?Likely due to steroid.  Resolved.  A1c 5.2%. ? ? ? ?DVT prophylaxis: SCD's Start: 06/29/21 1231 ?apixaban (ELIQUIS) tablet 5 mg  ?  Code Status: Full Code ?Family Communication: No family present at bedside this morning ? ?Disposition Plan:  ?Level of care: Med-Surg ?Status is: Inpatient ? ?Remains inpatient appropriate because: Awaiting SNF placement ? ?  ? ? ?Procedures:  ?ACDF C3-7, neurosurgery Dr. Trenton Gammon 2/3 ?Hematoma evacuation 2/7 ?Vascular duplex  ultrasound bilateral lower extremities 2/19 ?TTE 2/20 ?TTE 3/1 ?IR G-tube placement 3/6, 3/24 ? ?Antimicrobials:  ?Vancomycin 2/7 - 2/8; 2/18 -  2/21 ?Zosyn 2/21 -2/23 ?Ampicillin 2/7 -2/11 ?Metronidazole 2/7 - 2/8; 2/17 -2/18

## 2021-08-25 NOTE — Progress Notes (Signed)
? ?  Providing Compassionate, Quality Care - Together ? ?NEUROSURGERY PROGRESS NOTE ? ? ?S: No issues overnight.  ? ?O: EXAM:  ?BP 106/76 (BP Location: Right Arm)   Pulse 85   Temp 98.8 ?F (37.1 ?C) (Oral)   Resp 17   Ht 5\' 10"  (1.778 m)   Wt 50.2 kg   SpO2 100%   BMI 15.88 kg/m?  ? ?Awake, alert, oriented  ?PERRL ?Speech fluent, appropriate  ?CNs grossly intact  ?Moves all extremities equally, difficulty with fine motor movement ?Interossei wasting ?Incision healing well ? ?ASSESSMENT:  ?61 y.o. male with  ? ?Cervical spondylotic myelopathy ? ?-Status post C3-7 ACDF ? ?PLAN: ?-Rehab pending ?-Neuro stable ? ? ? ?Thank you for allowing me to participate in this patient's care.  Please do not hesitate to call with questions or concerns. ? ? ?Orin Eberwein, DO ?Neurosurgeon ?[facility] ?Cell: [phone] ? ?

## 2021-08-26 DIAGNOSIS — G959 Disease of spinal cord, unspecified: Secondary | ICD-10-CM | POA: Diagnosis not present

## 2021-08-26 LAB — GLUCOSE, CAPILLARY
Glucose-Capillary: 105 mg/dL — ABNORMAL HIGH (ref 70–99)
Glucose-Capillary: 116 mg/dL — ABNORMAL HIGH (ref 70–99)
Glucose-Capillary: 134 mg/dL — ABNORMAL HIGH (ref 70–99)
Glucose-Capillary: 157 mg/dL — ABNORMAL HIGH (ref 70–99)
Glucose-Capillary: 90 mg/dL (ref 70–99)
Glucose-Capillary: 94 mg/dL (ref 70–99)

## 2021-08-26 NOTE — Progress Notes (Signed)
? ?  Providing Compassionate, Quality Care - Together ? ?NEUROSURGERY PROGRESS NOTE ? ? ?S: No issues overnight.  ? ?O: EXAM:  ?BP 99/75 (BP Location: Left Arm)   Pulse 72   Temp 97.8 ?F (36.6 ?C) (Oral)   Resp 17   Ht 5\' 10"  (1.778 m)   Wt 50.6 kg   SpO2 100%   BMI 16.01 kg/m?  ? ?Awake, alert, oriented  ?PERRL ?Speech fluent, appropriate  ?CNs grossly intact  ?Moves all extremities equally, difficulty with fine motor movement ?Interossei wasting ?Incision healing well ?  ?ASSESSMENT:  ?61 y.o. male with  ?  ?Cervical spondylotic myelopathy ?  ?-Status post C3-7 ACDF ?  ?PLAN: ?-Rehab pending ?-Neuro stable ? ? ?Thank you for allowing me to participate in this patient's care.  Please do not hesitate to call with questions or concerns. ? ? ?Demari Kropp, DO ?Neurosurgeon ?[facility] ?Cell: [phone] ? ?

## 2021-08-26 NOTE — Plan of Care (Signed)
?  Problem: Pain Management: ?Goal: Pain level will decrease ?Outcome: Progressing ?  ?Problem: Skin Integrity: ?Goal: Will show signs of wound healing ?Outcome: Progressing ?  ?Problem: Safety: ?Goal: Ability to remain free from injury will improve ?Outcome: Progressing ?  ?Problem: Education: ?Goal: Knowledge of General Education information will improve ?Description: Including pain rating scale, medication(s)/side effects and non-pharmacologic comfort measures ?Outcome: Progressing ?  ?Problem: Clinical Measurements: ?Goal: Respiratory complications will improve ?Outcome: Progressing ?  ?

## 2021-08-26 NOTE — Plan of Care (Signed)
?  Problem: Education: ?Goal: Ability to verbalize activity precautions or restrictions will improve ?Outcome: Progressing ?Goal: Knowledge of the prescribed therapeutic regimen will improve ?Outcome: Progressing ?Goal: Understanding of discharge needs will improve ?Outcome: Progressing ?  ?Problem: Activity: ?Goal: Ability to avoid complications of mobility impairment will improve ?Outcome: Progressing ?Goal: Ability to tolerate increased activity will improve ?Outcome: Progressing ?Goal: Will remain free from falls ?Outcome: Progressing ?  ?Problem: Bowel/Gastric: ?Goal: Gastrointestinal status for postoperative course will improve ?Outcome: Progressing ?  ?Problem: Clinical Measurements: ?Goal: Ability to maintain clinical measurements within normal limits will improve ?Outcome: Progressing ?Goal: Postoperative complications will be avoided or minimized ?Outcome: Progressing ?Goal: Diagnostic test results will improve ?Outcome: Progressing ?  ?Problem: Pain Management: ?Goal: Pain level will decrease ?Outcome: Progressing ?  ?Problem: Skin Integrity: ?Goal: Will show signs of wound healing ?Outcome: Progressing ?  ?Problem: Health Behavior/Discharge Planning: ?Goal: Identification of resources available to assist in meeting health care needs will improve ?Outcome: Progressing ?  ?Problem: Bladder/Genitourinary: ?Goal: Urinary functional status for postoperative course will improve ?Outcome: Progressing ?  ?Problem: Safety: ?Goal: Ability to remain free from injury will improve ?Outcome: Progressing ?  ?Problem: Education: ?Goal: Knowledge of General Education information will improve ?Description: Including pain rating scale, medication(s)/side effects and non-pharmacologic comfort measures ?Outcome: Progressing ?  ?Problem: Health Behavior/Discharge Planning: ?Goal: Ability to manage health-related needs will improve ?Outcome: Progressing ?  ?Problem: Clinical Measurements: ?Goal: Ability to maintain clinical  measurements within normal limits will improve ?Outcome: Progressing ?Goal: Will remain free from infection ?Outcome: Progressing ?Goal: Diagnostic test results will improve ?Outcome: Progressing ?Goal: Respiratory complications will improve ?Outcome: Progressing ?Goal: Cardiovascular complication will be avoided ?Outcome: Progressing ?  ?Problem: Activity: ?Goal: Risk for activity intolerance will decrease ?Outcome: Progressing ?  ?Problem: Nutrition: ?Goal: Adequate nutrition will be maintained ?Outcome: Progressing ?  ?Problem: Coping: ?Goal: Level of anxiety will decrease ?Outcome: Progressing ?  ?Problem: Elimination: ?Goal: Will not experience complications related to bowel motility ?Outcome: Progressing ?Goal: Will not experience complications related to urinary retention ?Outcome: Progressing ?  ?Problem: Pain Managment: ?Goal: General experience of comfort will improve ?Outcome: Progressing ?  ?Problem: Safety: ?Goal: Ability to remain free from injury will improve ?Outcome: Progressing ?  ?Problem: Skin Integrity: ?Goal: Risk for impaired skin integrity will decrease ?Outcome: Progressing ?  ?Problem: Safety: ?Goal: Non-violent Restraint(s) ?Outcome: Progressing ?  ?

## 2021-08-26 NOTE — Progress Notes (Signed)
?PROGRESS NOTE ? ? ? ?[name]  [id number] DOB: 17-Jun-1960 DOA: 06/29/2021 ?PCP: Pcp, No  ? ? 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  He was admitted on 2/3 and underwent ACDF of C3-4, C4-5, C5-5, and C6-7.  Post-operatively he was progressing slowly with residual weakness in both hands but improving lower extremity strength and function.  He developed some difficulty swallowing on 2/5 and SLP ordered and placed on thickened liquids.   Overnight 2/6, patient with increased difficulty swallowing and was made NPO but also noted to have dysarthria and difficulty moving extremities with numbness.  SLP evaluated and found to be moderate aspiration risk.   Also progressively tachycardic, tachypneic, and now febrile.  Code stroke activated and taken for CT/ CTA head and neck and was given decadron 10mg  once.  NIHSS 18.  CTH was negative for acute findings, and CTA head neck did not reveal any LVO but noted significant prevertebral soft tissue swelling with foci of gas present at the ventral epidural space at C4-5, small collection not excluded. On 2/7, patient with worsening confusion and concern for airway involvement, PCCM consulted and remained in the intensive care unit until he is transferred to the floor on 3/2 and PCCM requested transition to [facility] for medical assistance while patient remains under the neurosurgery service. ? ?Significant Hospital Events: ?2/3 ACDF C3-4, C4-5, C5-5, and C6-7 w/ Dr. Annette Stable ?2/6 SLP eval for difficulty swallowing ?2/7 Code stroke overnight, neg for LVO, CT showing soft tissue swelling.  PCCM consulted for concern of airway  management and AMS.  Went for evacuation of hematoma  ?2/18 CT Abd/Pelvis: showing bilateral segmental Pes, started on heparin and showing worsening pneumonia, febrile to  103.  PCT rising, 34.6, restarted on abx ?2/19 possible aspiration w/ severe hypoxia; intubated; Switching from heparin to angiomax given subtherapeutic levels despite increase in rate. ?2/20 Echo shows normal LV systolic function. There was notation of a + McConnell's sign c/w a large pulmonary embolus which is consistent with the finding of bilateral segmental pulmonary emboli noted on CT 07/14/2021. ?2/21 rash appreciated on back; stopped cefepim/vanc switched to zosyn; required low dose levo with increase in fentanyl ?2/22 remains intubated; unable to extubate to do increase RR and thick secretions ?2/23 [name] infusion added ?2/24: weaning fentanyl.  ?2/26 extubated ?2/28 tachycardia persists ?3/2: Modified barium swallow, continues n.p.o. with core track in place, likely will need PEG tube. ?3/6: s/p IR G-tube placement ?3/16: Foley catheter discontinued ?3/24: E. coli UTI/bacteremia>> cefazolin x7 days per ID ?Now medically clear awaiting SNF placement. ?  ? ?  ?Assessment and Plan: ?* Cervical myelopathy ([name]); severe cervical spinal stenosis with cord compression s/p ACDF [id number] on 2/3, complicated by epidural hematoma s/p evacuation 2/7 ?Patient initially presented s/p recent fall with progressive upper and lower extremity weakness and tremors, He was found to have critical multilevel cervical spinal stenosis @ C3-6 with spinal cord compression and spinal cord signal change and underwent ACDF C3-4, C4-5, C5-5, and C6/7 by neurosurgery Dr. Trenton Gammon on 06/29/2021.  Postoperative leg complicated by epidural hematoma s/p evacuation on 2/7. ?--Continue PT/OT/SLP efforts, Awaiting SNF ?--Further as per neurosurgery recommendation. ? ?Acute respiratory failure with hypoxia ([place]) ?  Likely Multifactorial with significant dysphagia and recurrent  aspiration pneumonia events during initial hospitalization following ACDF surgery with postoperative complications of postoperative cervical hematoma.  Patient did require ventilatory support in the intensive care unit and was successfully extubated on 07/22/2021.  Patient completed extensive course of empiric antibiotics.  Oxygen now weaned off, Now on room air. ?--Continue Aspiration precautions ? ? ? ? ?Acute pulmonary embolism ([name]) ?CTA chest showed bilateral segmental PE.  LE [place] negative for DVT.  TTE with normal LV systolic function.  Supplemental oxygen weaned off. ?--Eliquis 5mg  BID ? ?Acute metabolic encephalopathy ?CT head, MRI brain, EEG, TSH, B12, ammonia unrevealing. Etiology likely multifactorial with acute respiratory failure secondary to aspiration pneumonia, bilateral pulmonary embolism.  Completed course of antibiotics for aspiration pneumonia.  Seroquel and clonazepam discontinued.  Completed course of antibiotics for E. coli UTI/septicemia ? ?E. coli bacteremia ?Urine culture and blood cultures x2 08/17/2028 positive for E. coli.  Evaluated by infectious disease.  Completed 7-day course of antibiotics per ID. ? ?Severe sepsis due to recurrent aspiration pneumonia ?Likely recurrent issue during hospitalization, remains n.p.o. due to continued aspiration risk per SLP.  Completed extensive course of antibiotics while under ICU care. S/p IR gtube 3/6. ?--Continue tube feeds, transitioned to bolus feeds 3/31 ?--cleared for dysphagia 3 mechanical soft diet with thin liquids per SLP; but with very little oral intake per RN ?--Continue aspiration precautions ?--Continue SLP efforts while inpatient ? ?Sinus tachycardia ?Multifactorial including sepsis, dehydration, PE, agitation.   ?--Continue metoprolol 100 mg twice daily ? ? ? ?Elevated liver enzymes ?Etiology likely secondary to sepsis from aspiration pneumonia as above.  Acute hepatitis panel negative.  RUQ ultrasound unremarkable. ?--Continue monitor  LFTs intermittently ? ?Hyponatremia ?Etiology likely secondary to dehydration.  Urine sodium low.  Continue tube feeds; now cleared by speech therapy for dysphagia 3 diet.  Sodium now up to 136 as of 08/20/2021. ? ? ?Tobacco use disorder ?Encouraged cessation. ?--Nicotine patch; decreased to 7 mg on 3/15 ? ?Physical deconditioning ?Significant weakness in all extremities as a result of cervical myelopathy and acute illness.  Slowly improving while inpatient. ?--Continue PT/OT efforts, will need SNF placement; difficult placement per TOC  ? ?Urinary retention ?Foley catheter discontinued on 08/09/2021 with no current signs of urinary retention. ?--Continue bethanechol ?--Continue bladder scan as needed ?--Continue monitor urinary output ? ?Protein-calorie malnutrition, severe ([place]) ?As evidenced by poor p.o. intake, significant muscle mass and subcu fat loss and significant weight loss (about 16 pounds since this hospitalization). ?Nutrition Problem: Severe Malnutrition (in the context of social/environmental circumstances) ?Etiology:  (inadequate energy intake) ?Signs/Symptoms: mild fat depletion, severe muscle depletion, severe muscle depletion ?Interventions: Refer to RD note for recommendations  ?--Dietitian following, appreciate assistance.   ?--s/p IR gtube 3/6.  ?--Now cleared for dysphagia 3 diet ?--Continue tube feeds; transitioning to bolus feeds 3/31 ?--continue SLP efforts ? ?Hyperglycemia-resolved as of 07/12/2021 ?Likely due to steroid.  Resolved.  A1c 5.2%. ? ? ? ?DVT prophylaxis: SCD's Start: 06/29/21 1231 ?apixaban (ELIQUIS) tablet 5 mg  ?  Code Status: Full Code ?Family Communication: No family present at bedside this morning ? ?Disposition Plan:  ?Level of care: Med-Surg ?Status is: Inpatient ? ?Remains inpatient appropriate because: Awaiting SNF placement ? ?  ? ? ?Procedures:  ?ACDF C3-7, neurosurgery Dr. Trenton Gammon 2/3 ?Hematoma evacuation 2/7 ?Vascular duplex ultrasound bilateral lower extremities  2/19 ?TTE 2/20 ?TTE 3/1 ?IR G-tube placement 3/6, 3/24 ? ?Antimicrobials:  ?Vancomycin 2/7 - 2/8; 2/18 - 2/21 ?Zosyn 2/21 -2/23 ?Ampicillin 2/7 -  2/11 ?Metronidazole 2/7 - 2/8; 2/17 -2/18 ?Cefepime 2/18 - 2/21 ?Unasyn 3/2

## 2021-08-27 ENCOUNTER — Inpatient Hospital Stay (HOSPITAL_COMMUNITY): Payer: 59

## 2021-08-27 DIAGNOSIS — G959 Disease of spinal cord, unspecified: Secondary | ICD-10-CM | POA: Diagnosis not present

## 2021-08-27 DIAGNOSIS — M545 Low back pain, unspecified: Secondary | ICD-10-CM | POA: Diagnosis not present

## 2021-08-27 LAB — COMPREHENSIVE METABOLIC PANEL
ALT: 34 U/L (ref 0–44)
AST: 49 U/L — ABNORMAL HIGH (ref 15–41)
Albumin: 3 g/dL — ABNORMAL LOW (ref 3.5–5.0)
Alkaline Phosphatase: 122 U/L (ref 38–126)
Anion gap: 9 (ref 5–15)
BUN: 25 mg/dL — ABNORMAL HIGH (ref 6–20)
CO2: 23 mmol/L (ref 22–32)
Calcium: 9.9 mg/dL (ref 8.9–10.3)
Chloride: 98 mmol/L (ref 98–111)
Creatinine, Ser: 0.72 mg/dL (ref 0.61–1.24)
GFR, Estimated: >60 mL/min (ref >60)
Glucose, Bld: 91 mg/dL (ref 70–99)
Potassium: 4.6 mmol/L (ref 3.5–5.1)
Sodium: 130 mmol/L — ABNORMAL LOW (ref 135–145)
Total Bilirubin: 0.4 mg/dL (ref 0.3–1.2)
Total Protein: 8.2 g/dL — ABNORMAL HIGH (ref 6.5–8.1)

## 2021-08-27 LAB — GLUCOSE, CAPILLARY
Glucose-Capillary: 115 mg/dL — ABNORMAL HIGH (ref 70–99)
Glucose-Capillary: 80 mg/dL (ref 70–99)
Glucose-Capillary: 128 mg/dL — ABNORMAL HIGH (ref 70–99)
Glucose-Capillary: 119 mg/dL — ABNORMAL HIGH (ref 70–99)
Glucose-Capillary: 132 mg/dL — ABNORMAL HIGH (ref 70–99)
Glucose-Capillary: 121 mg/dL — ABNORMAL HIGH (ref 70–99)

## 2021-08-27 LAB — CBC
HCT: 29.2 % — ABNORMAL LOW (ref 39.0–52.0)
Hemoglobin: 9.3 g/dL — ABNORMAL LOW (ref 13.0–17.0)
MCH: 30.3 pg (ref 26.0–34.0)
MCHC: 31.8 g/dL (ref 30.0–36.0)
MCV: 95.1 fL (ref 80.0–100.0)
Platelets: 588 10*3/uL — ABNORMAL HIGH (ref 150–400)
RBC: 3.07 MIL/uL — ABNORMAL LOW (ref 4.22–5.81)
RDW: 16.2 % — ABNORMAL HIGH (ref 11.5–15.5)
WBC: 8.8 10*3/uL (ref 4.0–10.5)
nRBC: 0 % (ref 0.0–0.2)

## 2021-08-27 LAB — MAGNESIUM: Magnesium: 1.7 mg/dL (ref 1.7–2.4)

## 2021-08-27 LAB — PHOSPHORUS: Phosphorus: 4 mg/dL (ref 2.5–4.6)

## 2021-08-27 IMAGING — MR MR LUMBAR SPINE W/O CM
4 of 5 series · 18 of 48 positions shown · non-contrast
Comparison: None.

CLINICAL DATA: Low back pain. Infection suspected. Abnormal
x-ray/CT.

EXAM:
MRI LUMBAR SPINE WITHOUT CONTRAST
TECHNIQUE: Multiplanar, multisequence MR imaging of the lumbar spine was
performed. No intravenous contrast was administered.

[Series 3: T2 · sagittal · 4.0mm · 0.55mm/px · 5 of 14 slices shown (1 of 2)]
[im 1/14]
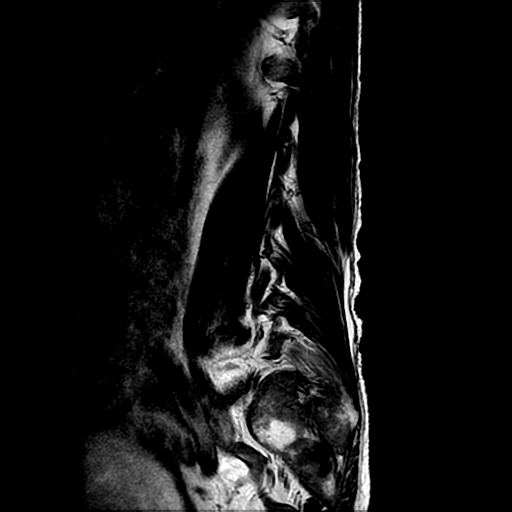
[im 4/14]
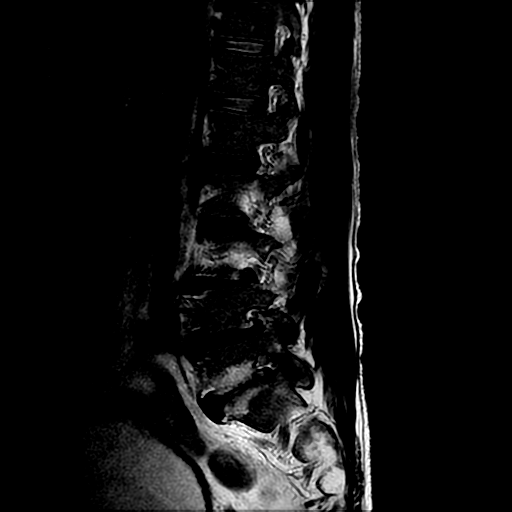
[im 7/14]
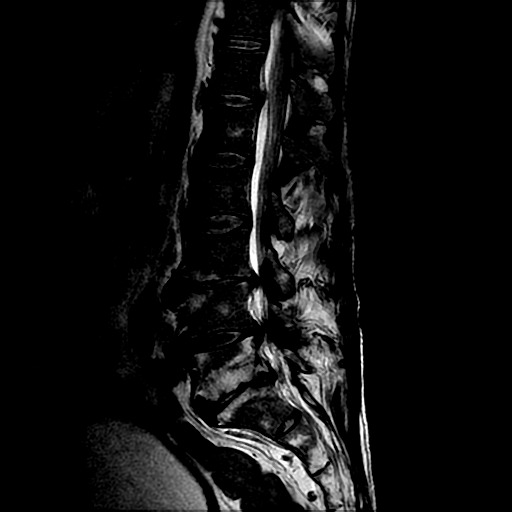
[im 10/14]
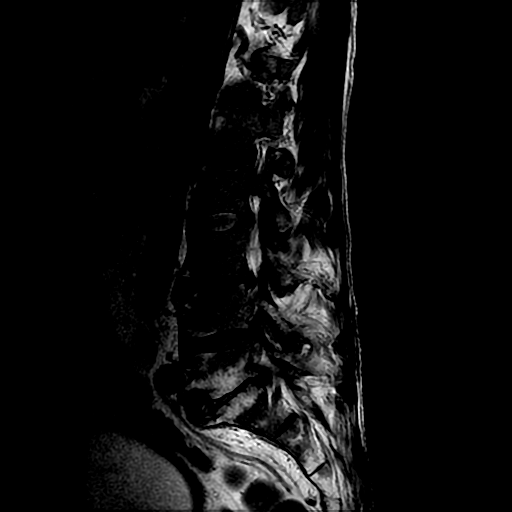
[im 14/14]
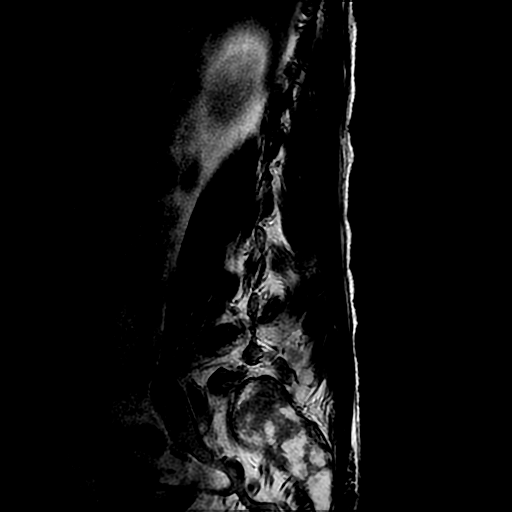

[Series 5: T1 · sagittal · 4.0mm · 0.55mm/px · 3 of 14 slices shown (1 of 2)]
[im 3/14]
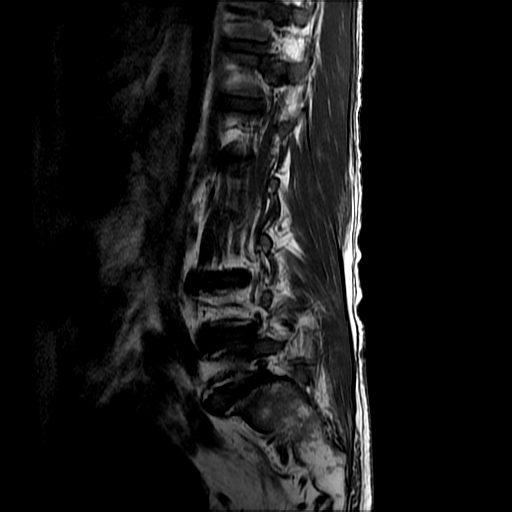
[im 8/14]
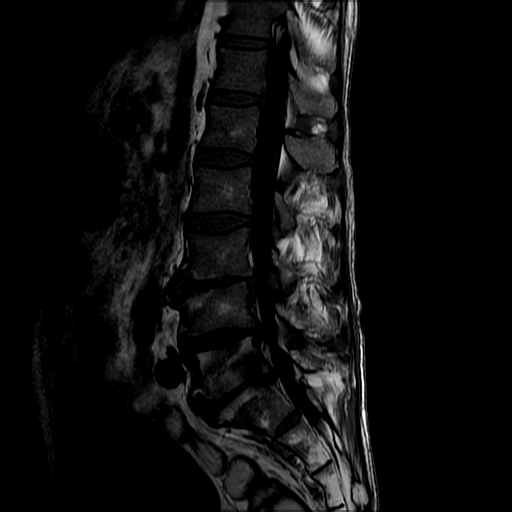
[im 14/14]
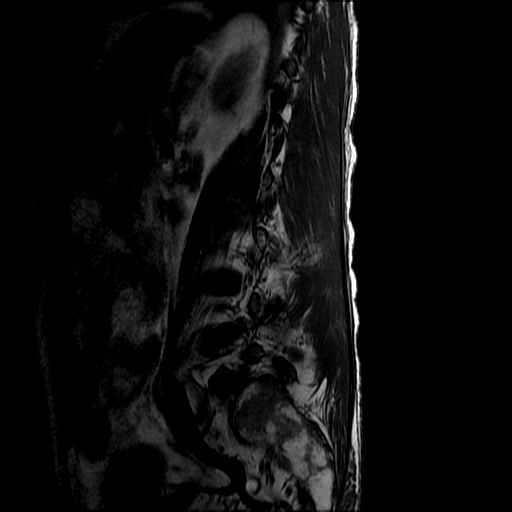

[Series 6: T2 · axial · 4.0mm · 0.39mm/px · z∈[-144,+9]mm · 7 of 39 slices shown (2 of 2)]
[im 3/39]
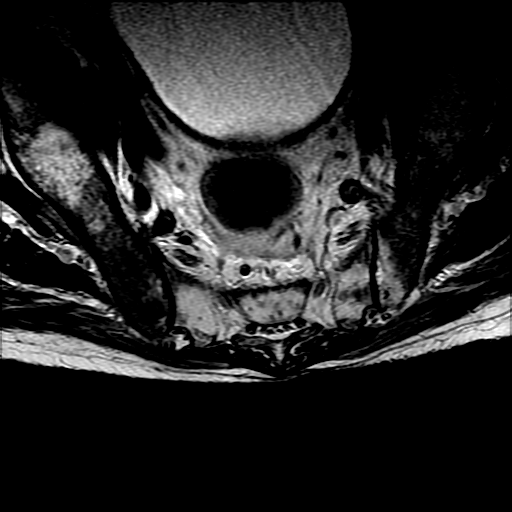
[im 6/39]
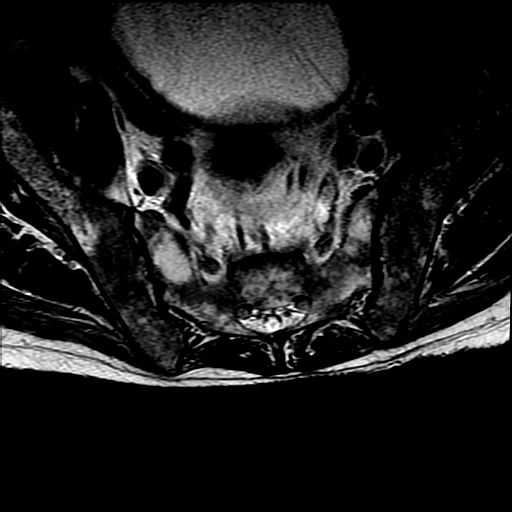
[im 8/39]
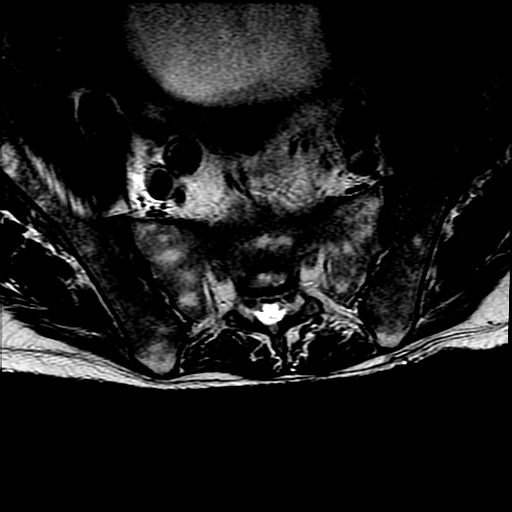
[im 13/39]
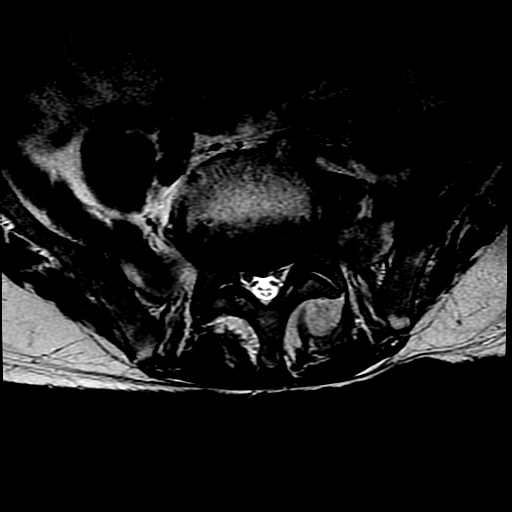
[im 18/39]
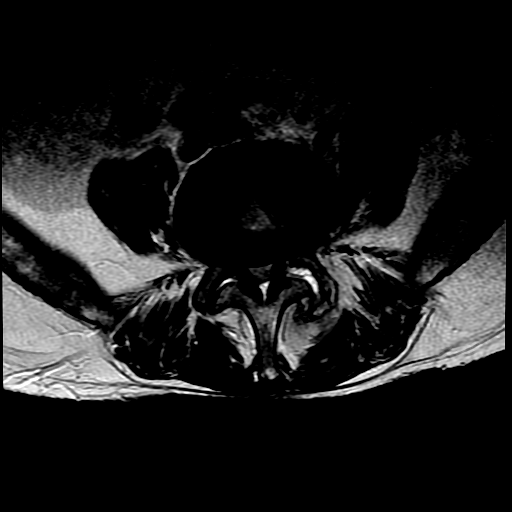
[im 21/39]
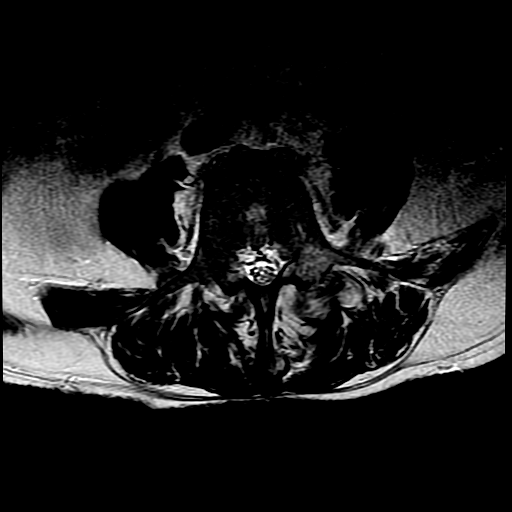
[im 33/39]
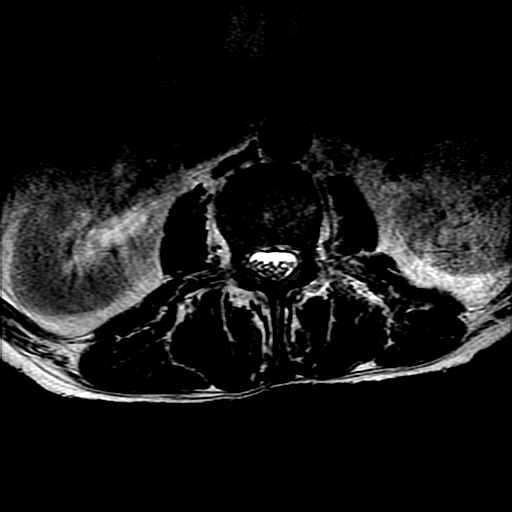

[Series 7: T1 · axial · 4.0mm · 0.39mm/px · z∈[-129,+9]mm · 3 of 39 slices shown (2 of 2)]
[im 6/39]
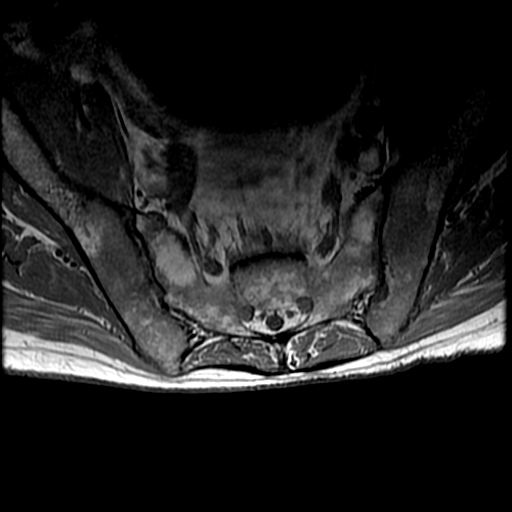
[im 21/39]
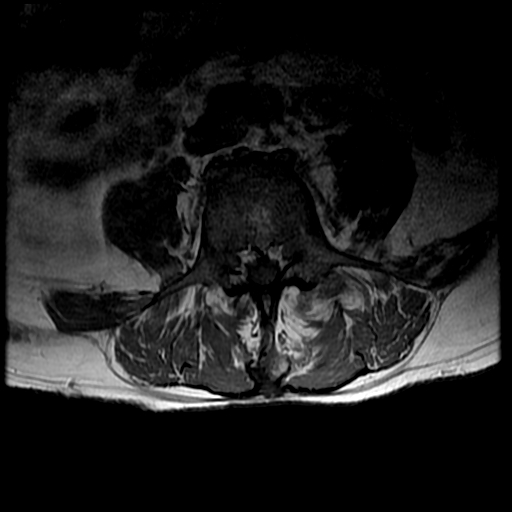
[im 33/39]
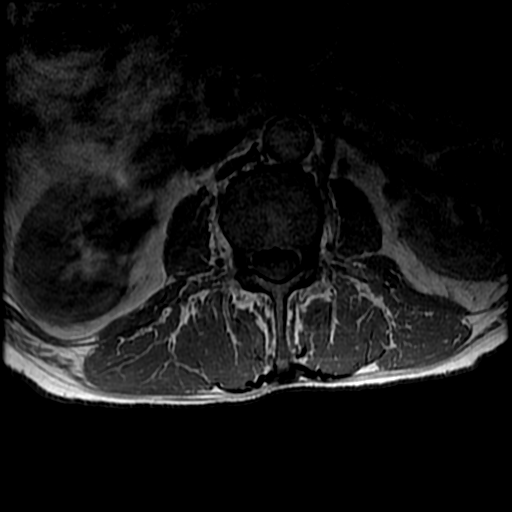

[18 of 48 positions shown; findings below may reference images not displayed]

FINDINGS: Segmentation:  5 lumbar type vertebral bodies.

Alignment:  Normal

Vertebrae: Chronic discogenic endplate marrow changes at L3-4, L4-5
and L5-S1. Some low level associated edema at L3-4 and L5-S1 could
relate to back pain.

Conus medullaris and cauda equina: Conus extends to the L1 level.
Conus and cauda equina appear normal.

Paraspinal and other soft tissues: Negative

Disc levels:

T12-L1: Shallow left paracentral disc protrusion without apparent
neural compression.

L1-2: Normal interspace.

L2-3: Mild bulging of the disc.  No compressive stenosis.

L3-4: Chronic disc degeneration with loss of disc height. Endplate
osteophytes and bulging of the disc. Mild facet and ligamentous
hypertrophy. Mild multifactorial stenosis with some potential for
neural compression in the lateral recesses.

L4-5: Endplate osteophytes and circumferential protrusion of the
disc. Facet and ligamentous hypertrophy. Facet effusions left more
extensive than right. Moderate to severe multifactorial stenosis
that could cause neural compression on either or both sides.
Foraminal stenosis left worse than right.

L5-S1: Chronic disc degeneration with loss of disc height. Endplate
osteophytes and bulging of the disc. No compressive canal stenosis.
Bilateral foraminal stenosis left worse than right could affect
either L5 nerve, particularly the left. There is a small amount of
endplate edema at this level and a small amount of T2 signal
material within the disc space, but overall the findings are not
particularly suggestive of spinal infection, more likely
degenerative.
IMPRESSION: L3-4: Mild multifactorial stenosis with some potential for neural
compression in the lateral recesses.

L4-5: Moderate to severe multifactorial stenosis that could cause
neural compression on either or both sides. Foraminal stenosis left
worse than right.

L5-S1: Bilateral foraminal stenosis left worse than right that could
affect either L5 nerve, more likely the left.

Endplate degenerative changes at L3-4, L4-5 and L5-S1 as above. The
findings are not particularly suggestive of spinal infection and are
more likely degenerative. See above for discussion at each level.

## 2021-08-27 MED ORDER — PROSOURCE TF PO LIQD
45.0000 mL | Freq: Four times a day (QID) | ORAL | Status: DC
  Administered 2021-08-27 – 2021-09-03 (×26): 45 mL
  Filled 2021-08-27 (×27): qty 45

## 2021-08-27 MED ORDER — FREE WATER
200.0000 mL | Status: DC
  Administered 2021-08-27 – 2021-09-03 (×42): 200 mL

## 2021-08-27 MED ORDER — OSMOLITE 1.5 CAL PO LIQD
1200.0000 mL | ORAL | Status: DC
  Administered 2021-08-27 – 2021-08-31 (×5): 1200 mL
  Administered 2021-09-01 – 2021-09-02 (×2): 1000 mL
  Filled 2021-08-27: qty 2000
  Filled 2021-08-27: qty 1000
  Filled 2021-08-27 (×2): qty 2000

## 2021-08-27 NOTE — Progress Notes (Signed)
Pt c/o increasing lower back pain despite tylenol and repositioning. Per MAR, pt received 1 dose of dilaudid early Sunday morning but previously has only taken tylenol and has not c/o pain unrelieved by the tylenol. Pt states, "something is not right" but is unable to elaborate other than saying his back is hurting. Pt has had recent episodes of vomiting and also has not been seen by PT over the weekend. Pt UOP = overnight and 1 incontinent episode that is amber in color. Pt labs showed some changes this morning that were reported to Dr. Toniann Fail. No neuro changes noted other than patient seeming slightly less energetic and talkative than usual. VS stable. Pt c/o of feeling like he is "freezing". He is afebrile, skin dry and warm to touch. ?

## 2021-08-27 NOTE — Progress Notes (Signed)
Nutrition Follow-up ? ?DOCUMENTATION CODES:  ?Severe malnutrition in context of social or environmental circumstances, Underweight ? ?INTERVENTION:  ?Continue to meet 75-100% of pt's nutrition needs via TF via PEG: ?Transition to nocturnal TF regimen ? -Administer Osmolite 1.5 @ 45m/hr x 16 hours (administer from 1700-0900) ? -458mProsource TF QID ? -20069mree water Q4H  ? ?This provides 1960 kcals, 119 grams of protein and 914 mL of free water (2114m47mtal free water with flushes) ?Meets ~93% minimum estimated calorie needs and 100% estimated protein needs ? ?NUTRITION DIAGNOSIS:  ?Severe Malnutrition related to social / environmental circumstances as evidenced by severe muscle depletion, severe fat depletion. -- ongoing  ? ?GOAL:  ?Patient will meet greater than or equal to 90% of their needs -- addressing via TF ? ?MONITOR:  ?TF tolerance ? ?REASON FOR ASSESSMENT:  ?Consult ?Assessment of nutrition requirement/status, Enteral/tube feeding initiation and management ? ?ASSESSMENT:  ?Pt with hx EtOH abuse, tobacco use, and spinal stenosis initially presented 2/3 for planned multilevel anterior cervical decompression and fusion surgery after experiencing progressive bilateral upper and lower extremity weakness and spasticity due to critical multilevel cervical spinal stenosis with spinal cord compression and signal change. ? ?02/03 - s/p anterior cervical discectomy with interbody fusion  ?02/05 - pt initially complained of worsening swallowing function ?02/06 - MBS, NPO per SLP ?02/07 - Code Stroke activated (negative) but transferred to ICU, s/p re-exploration anterior cervical fusion with evacuation of epidural hematoma ?02/08 - Cortrak tube placed (tip gastric), tube feeds initiated ?02/10 - MBS, diet advanced to dysphagia 2 with nectar-thick liquids, Cortrak removed ?02/11 - NPO ?02/13 - diet advanced to dysphagia 2 with nectar-thick liquids ?02/16 - diet advanced to dysphagia 2 with thin liquids ?02/19-  Intubated after desaturation, possibly from PO intake ?02/20 - s/p cortrak tube; post pyloric  ?02/22 - TF off, golytely given ?02/23 - restarted and advancing TF ?02/26 - Extubated ?03/02 - failed MBS ?03/06 - PEG placed ?03/23 - diet advanced to dysphagia 2 with nectar thick liquids s/p MBSS ?03/24 - PEG replaced ?03/25 - diet advanced to dysphagia 2 with nectar thick liquids ?03/27 - diet downgraded to dysphagia 1 with nectar thick liquids ?03/28 - diet advanced to dysphagia 3 with thin liquids ?03/30 - diet downgraded to dysphagia 3 with nectar thick liquids ? ?Pt was transitioned to bolus feeds last week (474ml63molite 1.5 TID w/ 45ml 4mource TF TID and free water flushes). Per discussions with pt, RN, MD, and NP, pt did not tolerate initial switch to bolus feeds and was vomiting after administration on 3/31. Suspect intolerance was due to bolus being administered too quickly. Will transition pt to nocturnal TF and plan to address bolus feeding concerns tomorrow, 4/4, to ensure pt is able to have bolus feeds administered to gravity to improve tolerance. Confirmed pt does want to continue having majority/all of his nutritional needs met via TF despite being able to safely have POs given he does not derive pleasure from eating and rarely has a good appetite.  ? ?PO Intake: 0-75% x last 8 recorded meals (16% avg meal intake) ? ?UOP: 750ml x34mours ?I/O: -6354ml si64madmit ? ?Will increase calorie/protein recommendations and amount being provided by TF as pt's weight is somewhat trending up, though not as consistently as would be preferred.  ? ?Admit wt 61.7 kg ?Current wt 50.6 kg ? ?Medications: folvite, mvi with minerals, thiamine ?Labs: ?Recent Labs  ?Lab 08/27/21 ?0331  ?[id number]*  ?K 4.6  ?CL 98  ?  CO2 23  ?BUN 25*  ?CREATININE 0.72  ?CALCIUM 9.9  ?MG 1.7  ?PHOS 4.0  ?GLUCOSE 91  ?CBGs: 80-157 x 24 hours ? ?Diet Order:   ?Diet Order   ? ?       ?  DIET DYS 3 Room service appropriate? Yes; Fluid consistency:  Nectar Thick  Diet effective now       ?  ? ?  ?  ? ?  ? ?EDUCATION NEEDS:  ?Education needs have been addressed ? ?Skin:  Skin Assessment: Skin Integrity Issues: ?Skin Integrity Issues:: Other (Comment) ?Incisions: closed neck ?Other: MASD buttocks ? ?Last BM:  4/2 ? ?Height:  ?Ht Readings from Last 1 Encounters:  ?07/15/21 _0  (1.778 m)  ? ?Weight:  ?Wt Readings from Last 1 Encounters:  ?08/26/21 50.6 kg  ? ?Ideal Body Weight:  78.2 kg ? ?BMI:  Body mass index is 16.01 kg/m?. ? ?Estimated Nutritional Needs:  ?Kcal:  2100-2300 ?Protein:  105-125 grams ?Fluid:  >2L/day ? ? ? ?Theone Stanley., MS, RD, LDN (she/her/hers) ?RD pager number and weekend/on-call pager number located in [place]. ? ? ?

## 2021-08-27 NOTE — Progress Notes (Signed)
?PROGRESS NOTE ? ? ? ?Brian [name]  [id number] DOB: 1960-10-09 DOA: 06/29/2021 ?PCP: Pcp, No  ? ? 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  He was admitted on 2/3 and underwent ACDF of C3-4, C4-5, C5-5, and C6-7.  Post-operatively he was progressing slowly with residual weakness in both hands but improving lower extremity strength and function.  He developed some difficulty swallowing on 2/5 and SLP ordered and placed on thickened liquids.   Overnight 2/6, patient with increased difficulty swallowing and was made NPO but also noted to have dysarthria and difficulty moving extremities with numbness.  SLP evaluated and found to be moderate aspiration risk.   Also progressively tachycardic, tachypneic, and now febrile.  Code stroke activated and taken for CT/ CTA head and neck and was given decadron 10mg  once.  NIHSS 18.  CTH was negative for acute findings, and CTA head neck did not reveal any LVO but noted significant prevertebral soft tissue swelling with foci of gas present at the ventral epidural space at C4-5, small collection not excluded. On 2/7, patient with worsening confusion and concern for airway involvement, PCCM consulted and remained in the intensive care unit until he is transferred to the floor on 3/2 and PCCM requested transition to [facility] for medical assistance while patient remains under the neurosurgery service. ? ?Significant Hospital Events: ?2/3 ACDF C3-4, C4-5, C5-5, and C6-7 w/ Dr. Annette Stable ?2/6 SLP eval for difficulty swallowing ?2/7 Code stroke overnight, neg for LVO, CT showing soft tissue swelling.  PCCM consulted for concern of airway  management and AMS.  Went for evacuation of hematoma  ?2/18 CT Abd/Pelvis: showing bilateral segmental Pes, started on heparin and showing worsening pneumonia, febrile to  103.  PCT rising, 34.6, restarted on abx ?2/19 possible aspiration w/ severe hypoxia; intubated; Switching from heparin to angiomax given subtherapeutic levels despite increase in rate. ?2/20 Echo shows normal LV systolic function. There was notation of a + McConnell's sign c/w a large pulmonary embolus which is consistent with the finding of bilateral segmental pulmonary emboli noted on CT 07/14/2021. ?2/21 rash appreciated on back; stopped cefepim/vanc switched to zosyn; required low dose levo with increase in fentanyl ?2/22 remains intubated; unable to extubate to do increase RR and thick secretions ?2/23 [name] infusion added ?2/24: weaning fentanyl.  ?2/26 extubated ?2/28 tachycardia persists ?3/2: Modified barium swallow, continues n.p.o. with core track in place, likely will need PEG tube. ?3/6: s/p IR G-tube placement ?3/16: Foley catheter discontinued ?3/24: E. coli UTI/bacteremia>> cefazolin x7 days per ID ?Now medically clear awaiting SNF placement. ?  ? ?  ?Assessment and Plan: ?* Cervical myelopathy (Brian [name]); severe cervical spinal stenosis with cord compression s/p ACDF [id number] on 2/3, complicated by epidural hematoma s/p evacuation 2/7 ?Patient initially presented s/p recent fall with progressive upper and lower extremity weakness and tremors, He was found to have critical multilevel cervical spinal stenosis @ C3-6 with spinal cord compression and spinal cord signal change and underwent ACDF C3-4, C4-5, C5-5, and C6/7 by neurosurgery Dr. Trenton Gammon on 06/29/2021.  Postoperative leg complicated by epidural hematoma s/p evacuation on 2/7. ?--Continue PT/OT/SLP efforts, Awaiting SNF ?--Further as per neurosurgery recommendation. ? ?Acute respiratory failure with hypoxia ([place]) ?  Likely Multifactorial with significant dysphagia and recurrent  aspiration pneumonia events during initial hospitalization following ACDF surgery with postoperative complications of postoperative cervical hematoma.  Patient did require ventilatory support in the intensive care unit and was successfully extubated on 07/22/2021.  Patient completed extensive course of empiric antibiotics.  Oxygen now weaned off, Now on room air. ?--Continue Aspiration precautions ? ? ? ? ?Acute pulmonary embolism ([place]) ?CTA chest showed bilateral segmental PE.  LE [place] negative for DVT.  TTE with normal LV systolic function.  Supplemental oxygen weaned off. ?--Eliquis 5mg  BID ? ?Acute metabolic encephalopathy ?CT head, MRI brain, EEG, TSH, B12, ammonia unrevealing. Etiology likely multifactorial with acute respiratory failure secondary to aspiration pneumonia, bilateral pulmonary embolism.  Completed course of antibiotics for aspiration pneumonia.  Seroquel and clonazepam discontinued.  Completed course of antibiotics for E. coli UTI/septicemia ? ?E. coli bacteremia ?Urine culture and blood cultures x2 08/17/2028 positive for E. coli.  Evaluated by infectious disease.  Completed 7-day course of antibiotics per ID. ? ?Severe sepsis due to recurrent aspiration pneumonia ?Likely recurrent issue during hospitalization, remains n.p.o. due to continued aspiration risk per SLP.  Completed extensive course of antibiotics while under ICU care. S/p IR gtube 3/6. ?--Continue tube feeds, transitioned to bolus feeds 3/31 ?--cleared for dysphagia 3 mechanical soft diet with thin liquids per SLP; but with very little oral intake per RN ?--Continue aspiration precautions ?--Continue SLP efforts while inpatient ? ?Sinus tachycardia ?Multifactorial including sepsis, dehydration, PE, agitation.   ?--Continue metoprolol 100 mg twice daily ? ? ? ?Elevated liver enzymes ?Etiology likely secondary to sepsis from aspiration pneumonia as above.  Acute hepatitis panel negative.  RUQ ultrasound unremarkable. ?--Continue monitor  LFTs intermittently ? ?Hyponatremia ?Etiology likely secondary to dehydration.  Urine sodium low.  Continue tube feeds; now cleared by speech therapy for dysphagia 3 diet.  Sodium now up to 136 as of 08/20/2021. ? ? ?Tobacco use disorder ?Encouraged cessation. ?--Nicotine patch; decreased to 7 mg on 3/15 ? ?Physical deconditioning ?Significant weakness in all extremities as a result of cervical myelopathy and acute illness.  Slowly improving while inpatient. ?--Continue PT/OT efforts, will need SNF placement; difficult placement per TOC  ? ?Urinary retention ?Foley catheter discontinued on 08/09/2021 with no current signs of urinary retention. ?--Continue bethanechol ?--Continue bladder scan as needed ?--Continue monitor urinary output ? ?Protein-calorie malnutrition, severe ([place]) ?As evidenced by poor p.o. intake, significant muscle mass and subcu fat loss and significant weight loss (about 16 pounds since this hospitalization). ?Nutrition Problem: Severe Malnutrition (in the context of social/environmental circumstances) ?Etiology:  (inadequate energy intake) ?Signs/Symptoms: mild fat depletion, severe muscle depletion, severe muscle depletion ?Interventions: Refer to RD note for recommendations  ?--Dietitian following, appreciate assistance.   ?--s/p IR gtube 3/6.  ?--Now cleared for dysphagia 3 diet ?--Continue tube feeds; transitioning to bolus feeds 3/31 ?--continue SLP efforts ? ?Lumbar back pain ?Overnight, patient complaining of low back pain.  Denies focal weakness, no paresthesias.  Neurovascularly intact on physical exam.  MR L-spine with mild stenosis L3/4 with potential for neural compression lateral recesses, moderate-severe stenosis L4-5 that could cause neural compression, L5-S1 with bilateral foraminal stenosis left worse than right. ?--Continue PT/OT ? ?Hyperglycemia-resolved as of 07/12/2021 ?Likely due to steroid.  Resolved.  A1c 5.2%. ? ? ? ?DVT prophylaxis: SCD's Start: 06/29/21 1231 ?apixaban  (ELIQUIS) tablet 5 mg  ?  Code Status: Full Code ?Family Communication: No family present at bedside this morning ? ?Disposition Plan:  ?Level of care: Med-Surg ?Status is:  Inpatient ? ?Remains inpatient appropriate be

## 2021-08-27 NOTE — Assessment & Plan Note (Addendum)
-  Patient complaining of low back pain.   ?-Denies focal weakness, no paresthesias.   ?-Neurovascularly intact on physical exam.  MR L-spine with mild stenosis L3/4 with potential for neural compression lateral recesses, moderate-severe stenosis L4-5 that could cause neural compression, L5-S1 with bilateral foraminal stenosis left worse than right. ?-Patient had back pain and had spasms noted to bilateral legs so he was given as needed muscle relaxers as well as as needed acetaminophen which was not effective.  Subsequently he was given as needed Dilaudid and further care per neurosurgery ?-Continue PT/OT as they are still recommending SNF ?

## 2021-08-27 NOTE — Progress Notes (Signed)
? ?Providing Compassionate, Quality Care - Together ? ? ?Subjective: ?Patient is on the phone with his attorney during assessment. He would like to decrease his tube feeds to see if he can eat more. ? ?Objective: ?Vital signs in last 24 hours: ?Temp:  [98 ?F (36.7 ?C)-99 ?F (37.2 ?C)] 98.2 ?F (36.8 ?C) (04/03 1111) ?Pulse Rate:  [80-93] 80 (04/03 1111) ?Resp:  [16-20] 20 (04/03 1111) ?BP: (92-110)/(70-85) 100/76 (04/03 1111) ?SpO2:  [94 %-100 %] 100 % (04/03 1111) ? ?Intake/Output from previous day: ?04/02 0701 - 04/03 0700 ?In: 63 [P.O.:237] ?Out: 750 [Urine:750] ?Intake/Output this shift: ?Total I/O ?In: 160 [Other:160] ?Out: -  ? ?Alert; oriented to person, place, and time ?MAE ?Decreased fine motor BUE ?Incision is healing well ? ?Lab Results: ?Recent Labs  ?  08/27/21 ?[id number]  ?WBC 8.8  ?HGB 9.3*  ?HCT 29.2*  ?PLT 588*  ? ?BMET ?Recent Labs  ?  08/27/21 ?[id number]  ?NA 130*  ?K 4.6  ?CL 98  ?CO2 23  ?GLUCOSE 91  ?BUN 25*  ?CREATININE 0.72  ?CALCIUM 9.9  ? ? ?Studies/Results: ?MR LUMBAR SPINE WO CONTRAST ? ?Result Date: 08/27/2021 ?CLINICAL DATA:  Low back pain. Infection suspected. Abnormal x-ray/CT. EXAM: MRI LUMBAR SPINE WITHOUT CONTRAST TECHNIQUE: Multiplanar, multisequence MR imaging of the lumbar spine was performed. No intravenous contrast was administered. COMPARISON:  None. FINDINGS: Segmentation:  5 lumbar type vertebral bodies. Alignment:  Normal Vertebrae: Chronic discogenic endplate marrow changes at L3-4, L4-5 and L5-S1. Some low level associated edema at L3-4 and L5-S1 could relate to back pain. Conus medullaris and cauda equina: Conus extends to the L1 level. Conus and cauda equina appear normal. Paraspinal and other soft tissues: Negative Disc levels: T12-L1: Shallow left paracentral disc protrusion without apparent neural compression. L1-2: Normal interspace. L2-3: Mild bulging of the disc.  No compressive stenosis. L3-4: Chronic disc degeneration with loss of disc height. Endplate osteophytes and  bulging of the disc. Mild facet and ligamentous hypertrophy. Mild multifactorial stenosis with some potential for neural compression in the lateral recesses. L4-5: Endplate osteophytes and circumferential protrusion of the disc. Facet and ligamentous hypertrophy. Facet effusions left more extensive than right. Moderate to severe multifactorial stenosis that could cause neural compression on either or both sides. Foraminal stenosis left worse than right. L5-S1: Chronic disc degeneration with loss of disc height. Endplate osteophytes and bulging of the disc. No compressive canal stenosis. Bilateral foraminal stenosis left worse than right could affect either L5 nerve, particularly the left. There is a small amount of endplate edema at this level and a small amount of T2 signal material within the disc space, but overall the findings are not particularly suggestive of spinal infection, more likely degenerative. IMPRESSION: L3-4: Mild multifactorial stenosis with some potential for neural compression in the lateral recesses. L4-5: Moderate to severe multifactorial stenosis that could cause neural compression on either or both sides. Foraminal stenosis left worse than right. L5-S1: Bilateral foraminal stenosis left worse than right that could affect either L5 nerve, more likely the left. Endplate degenerative changes at L3-4, L4-5 and L5-S1 as above. The findings are not particularly suggestive of spinal infection and are more likely degenerative. See above for discussion at each level. Electronically Signed   By: Nelson Chimes M.D.   On: 08/27/2021 10:48   ? ?Assessment/Plan: ?Patient status post C3-4, C4-5, C5-6, C6-7 anterior cervical discectomy with interbody fusion by Dr. Annette Stable on 06/29/2021. Increased difficulty swallowing with lung atelectasis and elevated temperatures on 07/02/2021. Made NPO  following MBS by SLP. 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 CTA head and  neck negative for stroke. MRI revealed epidural hematoma. Patient underwent exploration of his cervical fusion with evacuation of the epidural hematoma on 07/03/2021. Cortrak was placed on 07/04/2021. Patient's strength much improved since epidural hematoma evacuation. Cortrak removed 07/06/2021 and dysphagia 2 diet with nectar thick liquids started. Patient with delirium vs ETOH withdrawal. Discontinued steroids 07/06/2021. CIWA protocol started and discontinued 07/07/2021. CT and MRI 07/07/2021 negative. Patient developed respiratory distress, requiring intubation on 07/15/2021. Work up revealed aspiration PNA and PE. Patient was extubated on 07/22/2021. Foley catheter removed 07/24/2021. Patient transferred to 3W on 07/26/2021. Foley replaced 07/28/2021. PEG placed 07/30/2021. Foley removed 08/09/2021. PEG pulled out by patient 08/16/2021. Delirium worsening 08/17/2021. Haldol administered. IR replaced PEG on 08/17/2021. Patient with urosepsis 08/18/2021. Culture grew E. Coli. C/o LBP this morning. Lumbar MRI demonstrated chronic degenerative changes with spinal stenosis at L3-4, L4-5, and L5-S1. ? ? LOS: 59 days  ? ?  ?-Continue efforts at rehabilitation. ?-Goal is for SNF placement. ?-RD to reassess bolus feeds. Will update plan if appropriate. ? ? ?Viona Gilmore, DNP, AGNP-C ?Nurse Practitioner ? ?[facility] ?[address], [place], [place] [address] ?PRN:[id number]    F: [phone] ? ?08/27/2021, 11:58 AM ? ? ? ? ?

## 2021-08-27 NOTE — Progress Notes (Signed)
PT Cancellation Note ? ?Patient Details ?Name: Brian [name] ?MRN: [id number] ?DOB: 11-Mar-1961 ? ? ?Cancelled Treatment:    Reason Eval/Treat Not Completed: Patient at procedure or test/unavailable. Pt went for stat MRI due to new onset of lower back pain and AMS. Acute PT to return as able, as appropriate to assess pt. ? ?[name], PT, DPT ?Acute Rehabilitation Services ?Secure chat preferred ?Office #: [phone] ? ? ? ?[name] ?08/27/2021, 11:30 AM ?

## 2021-08-28 DIAGNOSIS — G959 Disease of spinal cord, unspecified: Secondary | ICD-10-CM | POA: Diagnosis not present

## 2021-08-28 LAB — GLUCOSE, CAPILLARY
Glucose-Capillary: 111 mg/dL — ABNORMAL HIGH (ref 70–99)
Glucose-Capillary: 110 mg/dL — ABNORMAL HIGH (ref 70–99)
Glucose-Capillary: 109 mg/dL — ABNORMAL HIGH (ref 70–99)
Glucose-Capillary: 106 mg/dL — ABNORMAL HIGH (ref 70–99)
Glucose-Capillary: 120 mg/dL — ABNORMAL HIGH (ref 70–99)

## 2021-08-28 MED ORDER — CYCLOBENZAPRINE HCL 10 MG PO TABS
5.0000 mg | ORAL_TABLET | Freq: Once | ORAL | Status: AC
  Administered 2021-08-28: 5 mg via ORAL
  Filled 2021-08-28: qty 1

## 2021-08-28 NOTE — Progress Notes (Signed)
Patient did not sleep well overnight. Reported having severe back pain. Tylenol was first admin at 2350, no relief came of that. Dilaudid was then administered at 0126 some relief after administration. Patient was still unable to get comfortable in bed or rest. Around 0330 patient finally felt a little better and has taken short naps.  ?

## 2021-08-28 NOTE — Progress Notes (Signed)
Brief Nutrition Note ? ?Discussed nutrition plan of care with pt and he would like to continue with nocturnal TF regimen as recommended in RD assessment from 4/03. Please see note for details. Discussed with RN.  ? ? ?Rae Lips., MS, RD, LDN (she/her/hers) ?RD pager number and weekend/on-call pager number located in [place]. ? ?

## 2021-08-28 NOTE — Progress Notes (Signed)
Speech Language Pathology Treatment: Dysphagia  ?Patient Details ?Name: Brian [name] ?MRN: [id number] ?DOB: 19-Nov-1960 ?Today's Date: 08/28/2021 ?Time: 2482-5003 ?SLP Time Calculation (min) (ACUTE ONLY): 19 min ? ?Assessment / Plan / Recommendation ?Clinical Impression ? Therapy for dysphagia consisted of observations with po's and therapeutic exercise using expiratory muscle strength training with EMST 75. Pt did not wish to eat his breakfast after encouragement from therapist although did drink bottle of chocolate Boost. Therapist did not further thicken Boost this morning to determine tolerance. Multiple swallows produced, no cough, throat clear or wet vocal quality noted. After cueing him to clear throat as strategy he produced x 1. He states he is not experiencing coughing or other difficulty during meals.  ? ?Introduced EMST to assist with strengthening of musculature for respiration and swallowing. He used device with therapist adjusting settings to his ability that was not too difficult or easy for him. 10 cm H20 was most optimal setting and he demonstrated 2 sets of 10 repetitions and 3 sets of 5 repetitions. Advised him to use minimum of twice a day for 3 sets of 10 reps. ST will continue ?  ?HPI HPI: Pt is a 61 y/o admitted 06/29/21 following C3-7 ACDF after progressive cervical myelopathy symptoms with subsequent fall and severe incomplete SCI. Significant prevertebral swelling per imaging. Pt developed some difficulty swallowing on 2/5 and SLP consulted. MBS 2/6 revealed severe edema s/p ACDF, oral holding, a pharyngeal delay, inconsistent hyolaryngeal elevation, incomplete epiglottic inversion due to edema. Aspiration noted accross consistencies and and NPO status was recommended. Repeat MBS 2/10 showed significantly reduced pharyngeal edema and a dysphagia 2 diet with nectar thick liquids was initiated. Pt transitioned to thin liquids 2/16 and susbequently returned to nectar thick 2/17 due to concern  for aspiration. Pt developed pna, possible aspiration, requiring intubation 2/19-26. Cortrak placed 2/20. PEG placed in IR on 3/6. No significant PMH. This is pt's 3rd MBS ?  ?   ?SLP Plan ? Continue with current plan of care ? ?  ?  ?Recommendations for follow up therapy are one component of a multi-disciplinary discharge planning process, led by the attending physician.  Recommendations may be updated based on patient status, additional functional criteria and insurance authorization. ?  ? ?Recommendations  ?Diet recommendations: Dysphagia 3 (mechanical soft);Nectar-thick liquid ?Liquids provided via: Cup;Straw ?Medication Administration: Crushed with puree ?Supervision: Staff to assist with self feeding;Full supervision/cueing for compensatory strategies ?Compensations: Slow rate;Small sips/bites;Clear throat intermittently ?Postural Changes and/or Swallow Maneuvers: Seated upright 90 degrees  ?   ?    ?   ? ? ? ? Oral Care Recommendations: Oral care BID ?Follow Up Recommendations: Skilled nursing-short term rehab (<3 hours/day) ?Assistance recommended at discharge: Intermittent Supervision/Assistance ?SLP Visit Diagnosis: Dysphagia, oropharyngeal phase (R13.12) ?Plan: Continue with current plan of care ? ? ? ? ?  ?  ? ? ?[name] ? ?08/28/2021, 9:51 AM ?

## 2021-08-28 NOTE — Progress Notes (Signed)
Physical Therapy Treatment ?Patient Details ?Name: Brian [name] ?MRN: [id number] ?DOB: 11-16-1960 ?Today's Date: 08/28/2021 ? ? ?History of Present Illness Pt is a 61 y/o male admitted 06/29/21 following C3-7 ACDF after progressive cervical myelopathy symptoms including ataxia, bil UE and LE shaking and weakness, and loss of function in his hands with subsequent fall. Pt initially had improvement in neurological symptoms, but postoperative course was complicated by dysphasia and ataxia secondary to and epidural hematoma with evacuation of hematoma on 2/7. Course complicated by delirium secondary to encephalopathy, sepsis secondary to aspiration PNA, reintubated 2/19-2/26 for airway protection, bilat segmental PEs 2/18. PEG placed 3/6. PMHx includes tobacco and EtOH use disorder. ? ?  ?PT Comments  ? ? Pt very eager and motivated to work with therapy and walk again. Pt wanted to attempt wearing shoes to see if it would improve his ability to ambulate. Pt dependent for donning of shoes as they were above the ankles and pt with limited bilat UE fine motor deficits. Pt did demo improved bilat LE control t/o seated LE exercises today and ability to minimize adduction. Pt also with improved standing tolerance and marching however limited due to onset of low back spasms. Pt with significant improvement with sitting EOB balance only requiring close supervision and was able to maintain balance with LE exercises. Began lateral scoot transfers to drop arm recliner. Pt to strongly benefit from aggressive rehab program for maximal functional recovery and to achieve safe mod I w/c level function. ?   ?Recommendations for follow up therapy are one component of a multi-disciplinary discharge planning process, led by the attending physician.  Recommendations may be updated based on patient status, additional functional criteria and insurance authorization. ? ?Follow Up Recommendations ? Skilled nursing-short term rehab (<3 hours/day)  (if pt's son can provide assist for pt upon d/c pt to strongly benefit from AIR upon d/c) ?  ?  ?Assistance Recommended at Discharge Frequent or constant Supervision/Assistance  ?Patient can return home with the following Two people to help with walking and/or transfers;A lot of help with bathing/dressing/bathroom;Assistance with cooking/housework;Assistance with feeding;Direct supervision/assist for medications management;Direct supervision/assist for financial management;Assist for transportation;Help with stairs or ramp for entrance ?  ?Equipment Recommendations ? Wheelchair (measurements PT);Wheelchair cushion (measurements PT);Hospital bed  ?  ?Recommendations for Other Services Rehab consult ? ? ?  ?Precautions / Restrictions Precautions ?Precautions: Fall ?Required Braces or Orthoses: Cervical Brace (has been d/c'd) ?Cervical Brace:  (now d/c'd) ?Restrictions ?Weight Bearing Restrictions: No  ?  ? ?Mobility ? Bed Mobility ?Overal bed mobility: Needs Assistance ?Bed Mobility: Rolling, Sidelying to Sit ?Rolling: Min assist ?Sidelying to sit: Min assist ?  ?  ?  ?General bed mobility comments: HOB elevated, pt with minimal UE use during transfer but able to bring LEs off EOB and initiated trunk elevation via pushing up from bilat elbows and abdominal strength ?  ? ?Transfers ?Overall transfer level: Needs assistance ?Equipment used: Rolling walker (2 wheels) (face to face transfer with PT) ?Transfers: Bed to chair/wheelchair/BSC, Sit to/from Stand ?Sit to Stand: Max assist, +2 safety/equipment ?  ?  ?  ?  ? Lateral/Scoot Transfers: Max assist ?General transfer comment: worked on standing up to [employer] with modAx2, assist needed at bilat knees due to increased tone and difficulty achieving knee extension, once up pt minA for static standing however is slightly pitched forward, trialed lateral scoot transfer to drop arm chair toward the R for first time today, maxA to initiate bottom clearance, max multimodal cues ?   ? ?  Ambulation/Gait ?  ?  ?  ?  ?  ?  ?  ?General Gait Details: worked on marching in place x 3 bouts, pt limited by onset of back spasms ? ? ?Stairs ?  ?  ?  ?  ?  ? ? ?Wheelchair Mobility ?  ? ?Modified Rankin (Stroke Patients Only) ?  ? ? ?  ?Balance Overall balance assessment: Needs assistance ?Sitting-balance support: No upper extremity supported ?Sitting balance-Leahy Scale: Fair ?Sitting balance - Comments: able to maintain balance while doing LE exercises ?  ?Standing balance support: Bilateral upper extremity supported ?Standing balance-Leahy Scale: Poor ?Standing balance comment: worked on standing exercises in [place] ?  ?  ?  ?  ?  ?  ?  ?  ?  ?  ?  ?  ? ?  ?Cognition Arousal/Alertness: Awake/alert ?Behavior During Therapy: WFL for tasks assessed/performed (very eager to work with therapy) ?Overall Cognitive Status: No family/caregiver present to determine baseline cognitive functioning ?Area of Impairment: Problem solving, Awareness ?  ?  ?  ?  ?  ?  ?  ?  ?  ?  ?  ?Following Commands: Follows one step commands consistently ?  ?Awareness: Emergent ?Problem Solving: Requires verbal cues, Requires tactile cues, Difficulty sequencing ?General Comments: pt very motivated to mobilize, pt constantly trying to find new ways to improve ie. "my son brought me some shoes" ?  ?  ? ?  ?Exercises General Exercises - Lower Extremity ?Ankle Circles/Pumps:  (slow and purposeful) ?Long Arc Quad: AROM, Both, 10 reps, Seated (with emphasis on not allowing adduction) ?Hip Flexion/Marching: AROM, Both, 10 reps, Seated (with shoes on, focus on placing foot back down where it started and not letting LE adduct towards opposite LE) ?Other Exercises ?Other Exercises: worked in standing on picking L/R foot up and placing it on an "X" to minimal ataxia when trying to advance LEs, tactile cues at posterior hips, REhab tech assisting pt with keeping hands on bed rail ? ?  ?General Comments General comments (skin integrity, edema, etc.):  VSS on RA, donned shoes today per pt request ?  ?  ? ?Pertinent Vitals/Pain Pain Assessment ?Pain Assessment: Faces ?Faces Pain Scale: Hurts even more ?Pain Location: low back - onset of spasms t/o PT session ?Pain Descriptors / Indicators: Grimacing, Spasm ?Pain Intervention(s): Monitored during session  ? ? ?Home Living   ?  ?  ?  ?  ?  ?  ?  ?  ?  ?   ?  ?Prior Function    ?  ?  ?   ? ?PT Goals (current goals can now be found in the care plan section) Acute Rehab PT Goals ?Patient Stated Goal: walk in 3 weeks ?PT Goal Formulation: With patient ?Time For Goal Achievement: 09/04/21 ?Potential to Achieve Goals: Fair ?Progress towards PT goals: Progressing toward goals ? ?  ?Frequency ? ? ? Min 5X/week ? ? ? ?  ?PT Plan Current plan remains appropriate;Frequency needs to be updated  ? ? ?Co-evaluation   ?  ?  ?  ?  ? ?  ?AM-PAC PT "6 Clicks" Mobility   ?Outcome Measure ? Help needed turning from your back to your side while in a flat bed without using bedrails?: A Little ?Help needed moving from lying on your back to sitting on the side of a flat bed without using bedrails?: A Little ?Help needed moving to and from a bed to a chair (including a wheelchair)?: A Lot ?Help  needed standing up from a chair using your arms (e.g., wheelchair or bedside chair)?: A Lot ?Help needed to walk in hospital room?: Total ?Help needed climbing 3-5 steps with a railing? : Total ?6 Click Score: 12 ? ?  ?End of Session Equipment Utilized During Treatment: Gait belt ?Activity Tolerance: Patient tolerated treatment well ?Patient left: with call bell/phone within reach;with chair alarm set;in chair ?Nurse Communication: Mobility status ?PT Visit Diagnosis: Muscle weakness (generalized) (M62.81);Other symptoms and signs involving the nervous system (R29.898);Difficulty in walking, not elsewhere classified (R26.2);Other abnormalities of gait and mobility (R26.89) ?  ? ? ?Time: [id number] ?PT Time Calculation (min) (ACUTE ONLY): 39  min ? ?Charges:  $Therapeutic Exercise: 23-37 mins ?$Therapeutic Activity: 8-22 mins          ?          ? ?Kittie Plater, PT, DPT ?Acute Rehabilitation Services ?Secure chat preferred ?Office #: [phone] ? ? ?

## 2021-08-28 NOTE — Plan of Care (Signed)
?  Problem: Activity: ?Goal: Ability to avoid complications of mobility impairment will improve ?Outcome: Progressing ?Goal: Ability to tolerate increased activity will improve ?Outcome: Progressing ?Goal: Will remain free from falls ?Outcome: Progressing ?  ?Problem: Education: ?Goal: Ability to verbalize activity precautions or restrictions will improve ?Outcome: Progressing ?Goal: Knowledge of the prescribed therapeutic regimen will improve ?Outcome: Progressing ?Goal: Understanding of discharge needs will improve ?Outcome: Progressing ?  ?Problem: Skin Integrity: ?Goal: Will show signs of wound healing ?Outcome: Progressing ?  ?

## 2021-08-28 NOTE — Progress Notes (Signed)
?PROGRESS NOTE ? ? ? ?Brian [name]  [id number] DOB: 05/19/1961 DOA: 06/29/2021 ?PCP: Pcp, No  ? ? 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  He was admitted on 2/3 and underwent ACDF of C3-4, C4-5, C5-5, and C6-7.  Post-operatively he was progressing slowly with residual weakness in both hands but improving lower extremity strength and function.  He developed some difficulty swallowing on 2/5 and SLP ordered and placed on thickened liquids.   Overnight 2/6, patient with increased difficulty swallowing and was made NPO but also noted to have dysarthria and difficulty moving extremities with numbness.  SLP evaluated and found to be moderate aspiration risk.   Also progressively tachycardic, tachypneic, and now febrile.  Code stroke activated and taken for CT/ CTA head and neck and was given decadron 10mg  once.  NIHSS 18.  CTH was negative for acute findings, and CTA head neck did not reveal any LVO but noted significant prevertebral soft tissue swelling with foci of gas present at the ventral epidural space at C4-5, small collection not excluded. On 2/7, patient with worsening confusion and concern for airway involvement, PCCM consulted and remained in the intensive care unit until he is transferred to the floor on 3/2 and PCCM requested transition to [facility] for medical assistance while patient remains under the neurosurgery service. ? ?Significant Hospital Events: ?2/3 ACDF C3-4, C4-5, C5-5, and C6-7 w/ Dr. Annette Stable ?2/6 SLP eval for difficulty swallowing ?2/7 Code stroke overnight, neg for LVO, CT showing soft tissue swelling.  PCCM consulted for concern of airway  management and AMS.  Went for evacuation of hematoma  ?2/18 CT Abd/Pelvis: showing bilateral segmental Pes, started on heparin and showing worsening pneumonia, febrile to  103.  PCT rising, 34.6, restarted on abx ?2/19 possible aspiration w/ severe hypoxia; intubated; Switching from heparin to angiomax given subtherapeutic levels despite increase in rate. ?2/20 Echo shows normal LV systolic function. There was notation of a + McConnell's sign c/w a large pulmonary embolus which is consistent with the finding of bilateral segmental pulmonary emboli noted on CT 07/14/2021. ?2/21 rash appreciated on back; stopped cefepim/vanc switched to zosyn; required low dose levo with increase in fentanyl ?2/22 remains intubated; unable to extubate to do increase RR and thick secretions ?2/23 [name] infusion added ?2/24: weaning fentanyl.  ?2/26 extubated ?2/28 tachycardia persists ?3/2: Modified barium swallow, continues n.p.o. with core track in place, likely will need PEG tube. ?3/6: s/p IR G-tube placement ?3/16: Foley catheter discontinued ?3/24: E. coli UTI/bacteremia>> cefazolin x7 days per ID ?Now medically clear awaiting SNF placement. ?  ? ?  ?Assessment and Plan: ?* Cervical myelopathy ([place]); severe cervical spinal stenosis with cord compression s/p ACDF [id number] on 2/3, complicated by epidural hematoma s/p evacuation 2/7 ?Patient initially presented s/p recent fall with progressive upper and lower extremity weakness and tremors, He was found to have critical multilevel cervical spinal stenosis @ C3-6 with spinal cord compression and spinal cord signal change and underwent ACDF C3-4, C4-5, C5-5, and C6/7 by neurosurgery Dr. Trenton Gammon on 06/29/2021.  Postoperative leg complicated by epidural hematoma s/p evacuation on 2/7. ?--Continue PT/OT/SLP efforts, Awaiting SNF ?--Further as per neurosurgery recommendation. ? ?Acute respiratory failure with hypoxia ([place]) ?  Likely Multifactorial with significant dysphagia and recurrent  aspiration pneumonia events during initial hospitalization following ACDF surgery with postoperative complications of postoperative cervical hematoma.  Patient did require ventilatory support in the intensive care unit and was successfully extubated on 07/22/2021.  Patient completed extensive course of empiric antibiotics.  Oxygen now weaned off, Now on room air. ?--Continue Aspiration precautions ? ? ? ? ?Acute pulmonary embolism ([place]) ?CTA chest showed bilateral segmental PE.  LE [place] negative for DVT.  TTE with normal LV systolic function.  Supplemental oxygen weaned off. ?--Eliquis 5mg  BID ? ?Acute metabolic encephalopathy ?CT head, MRI brain, EEG, TSH, B12, ammonia unrevealing. Etiology likely multifactorial with acute respiratory failure secondary to aspiration pneumonia, bilateral pulmonary embolism.  Completed course of antibiotics for aspiration pneumonia.  Seroquel and clonazepam discontinued.  Completed course of antibiotics for E. coli UTI/septicemia ? ?E. coli bacteremia ?Urine culture and blood cultures x2 08/17/2028 positive for E. coli.  Evaluated by infectious disease.  Completed 7-day course of antibiotics per ID. ? ?Severe sepsis due to recurrent aspiration pneumonia ?Likely recurrent issue during hospitalization, remains n.p.o. due to continued aspiration risk per SLP.  Completed extensive course of antibiotics while under ICU care. S/p IR gtube 3/6. ?--Continue tube feeds ?--cleared for dysphagia 3 mechanical soft diet with thin liquids per SLP; but with very little oral intake per RN; and patient with little desire for oral intake ?--Continue aspiration precautions ?--Continue SLP efforts while inpatient ? ?Sinus tachycardia ?Multifactorial including sepsis, dehydration, PE, agitation.   ?--Continue metoprolol 100 mg twice daily ? ? ? ?Elevated liver enzymes ?Etiology likely secondary to sepsis from aspiration pneumonia as above.  Acute hepatitis panel negative.  RUQ ultrasound  unremarkable. ?--Continue monitor LFTs intermittently ? ?Hyponatremia ?Etiology likely secondary to dehydration.  Urine sodium low.  Continue tube feeds; now cleared by speech therapy for dysphagia 3 diet.  ? ?Tobacco use disorder ?Encouraged cessation. ?--Nicotine patch; decreased to 7 mg on 3/15 ? ?Physical deconditioning ?Significant weakness in all extremities as a result of cervical myelopathy and acute illness.  Slowly improving while inpatient. ?--Continue PT/OT efforts, will need SNF placement; difficult placement per TOC  ? ?Urinary retention ?Foley catheter discontinued on 08/09/2021 with no current signs of urinary retention. ?--Continue bethanechol ?--Continue bladder scan as needed ?--Continue monitor urinary output ? ?Protein-calorie malnutrition, severe ([place]) ?As evidenced by poor p.o. intake, significant muscle mass and subcu fat loss and significant weight loss (about 16 pounds since this hospitalization). ?Nutrition Problem: Severe Malnutrition (in the context of social/environmental circumstances) ?Etiology:  (inadequate energy intake) ?Signs/Symptoms: mild fat depletion, severe muscle depletion, severe muscle depletion ?Interventions: Refer to RD note for recommendations  ?--Dietitian following, appreciate assistance.   ?--s/p IR gtube 3/6.  ?--Now cleared for dysphagia 3 diet ?--Continue tube feeds ?--continue SLP efforts ? ?Lumbar back pain ?Overnight, patient complaining of low back pain.  Denies focal weakness, no paresthesias.  Neurovascularly intact on physical exam.  MR L-spine with mild stenosis L3/4 with potential for neural compression lateral recesses, moderate-severe stenosis L4-5 that could cause neural compression, L5-S1 with bilateral foraminal stenosis left worse than right. ?--Continue PT/OT ? ?Hyperglycemia-resolved as of 07/12/2021 ?Likely due to steroid.  Resolved.  A1c 5.2%. ? ? ? ?DVT prophylaxis: SCD's Start: 06/29/21 1231 ?apixaban (ELIQUIS) tablet 5 mg  ?  Code Status: Full  Code ?Family Communication: No family present at bedside this morning ? ?Disposition Plan:  ?Level of care: Med-Surg ?Status is: Inpatient ? ?Remains inpatient appropriate because: Awaiting SNF placement ? ?  ? ? ?  Procedures:  ?ACDF C3

## 2021-08-28 NOTE — Progress Notes (Signed)
? ?Providing Compassionate, Quality Care - Together ? ? ?Subjective: ?Patient Is working with physical therapy during today's assessment.  He is up to the chair performing exercises to strengthen his lower extremities. ? ?Objective: ?Vital signs in last 24 hours: ?Temp:  [97.6 ?F (36.4 ?C)-98.4 ?F (36.9 ?C)] 97.9 ?F (36.6 ?C) (04/04 1110) ?Pulse Rate:  [72-84] 82 (04/04 1110) ?Resp:  [15-20] 18 (04/04 1110) ?BP: (99-105)/(67-77) 99/67 (04/04 1110) ?SpO2:  [99 %-100 %] 99 % (04/04 1110) ?Weight:  [52 kg] 52 kg (04/04 0400) ? ?Intake/Output from previous day: ?04/03 0701 - 04/04 0700 ?In: 1857.8 [P.O.:229; NG/GT:1408.8] ?Out: 1025 [Urine:1025] ?Intake/Output this shift: ?Total I/O ?In: 160 [Other:160] ?Out: -  ? ?Alert; oriented to person, place, and time ?MAE, Generalized weakness secondary to deconditioning ?Decreased fine motor BUE ?Incision is healing well ?  ? ?Lab Results: ?Recent Labs  ?  08/27/21 ?[id number]  ?WBC 8.8  ?HGB 9.3*  ?HCT 29.2*  ?PLT 588*  ? ?BMET ?Recent Labs  ?  08/27/21 ?[id number]  ?NA 130*  ?K 4.6  ?CL 98  ?CO2 23  ?GLUCOSE 91  ?BUN 25*  ?CREATININE 0.72  ?CALCIUM 9.9  ? ? ?Studies/Results: ?MR LUMBAR SPINE WO CONTRAST ? ?Result Date: 08/27/2021 ?CLINICAL DATA:  Low back pain. Infection suspected. Abnormal x-ray/CT. EXAM: MRI LUMBAR SPINE WITHOUT CONTRAST TECHNIQUE: Multiplanar, multisequence MR imaging of the lumbar spine was performed. No intravenous contrast was administered. COMPARISON:  None. FINDINGS: Segmentation:  5 lumbar type vertebral bodies. Alignment:  Normal Vertebrae: Chronic discogenic endplate marrow changes at L3-4, L4-5 and L5-S1. Some low level associated edema at L3-4 and L5-S1 could relate to back pain. Conus medullaris and cauda equina: Conus extends to the L1 level. Conus and cauda equina appear normal. Paraspinal and other soft tissues: Negative Disc levels: T12-L1: Shallow left paracentral disc protrusion without apparent neural compression. L1-2: Normal interspace. L2-3: Mild  bulging of the disc.  No compressive stenosis. L3-4: Chronic disc degeneration with loss of disc height. Endplate osteophytes and bulging of the disc. Mild facet and ligamentous hypertrophy. Mild multifactorial stenosis with some potential for neural compression in the lateral recesses. L4-5: Endplate osteophytes and circumferential protrusion of the disc. Facet and ligamentous hypertrophy. Facet effusions left more extensive than right. Moderate to severe multifactorial stenosis that could cause neural compression on either or both sides. Foraminal stenosis left worse than right. L5-S1: Chronic disc degeneration with loss of disc height. Endplate osteophytes and bulging of the disc. No compressive canal stenosis. Bilateral foraminal stenosis left worse than right could affect either L5 nerve, particularly the left. There is a small amount of endplate edema at this level and a small amount of T2 signal material within the disc space, but overall the findings are not particularly suggestive of spinal infection, more likely degenerative. IMPRESSION: L3-4: Mild multifactorial stenosis with some potential for neural compression in the lateral recesses. L4-5: Moderate to severe multifactorial stenosis that could cause neural compression on either or both sides. Foraminal stenosis left worse than right. L5-S1: Bilateral foraminal stenosis left worse than right that could affect either L5 nerve, more likely the left. Endplate degenerative changes at L3-4, L4-5 and L5-S1 as above. The findings are not particularly suggestive of spinal infection and are more likely degenerative. See above for discussion at each level. Electronically Signed   By: Nelson Chimes M.D.   On: 08/27/2021 10:48   ? ?Assessment/Plan: ?Patient status post C3-4, C4-5, C5-6, C6-7 anterior cervical discectomy with interbody fusion by Dr. Annette Stable on  06/29/2021. Increased difficulty swallowing with lung atelectasis and elevated temperatures on 07/02/2021. Made NPO  following MBS by SLP. 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 CTA head and neck negative for stroke. MRI revealed epidural hematoma. Patient underwent exploration of his cervical fusion with evacuation of the epidural hematoma on 07/03/2021. Cortrak was placed on 07/04/2021. Patient's strength much improved since epidural hematoma evacuation. Cortrak removed 07/06/2021 and dysphagia 2 diet with nectar thick liquids started. Patient with delirium vs ETOH withdrawal. Discontinued steroids 07/06/2021. CIWA protocol started and discontinued 07/07/2021. CT and MRI 07/07/2021 negative. Patient developed respiratory distress, requiring intubation on 07/15/2021. Work up revealed aspiration PNA and PE. Patient was extubated on 07/22/2021. Foley catheter removed 07/24/2021. Patient transferred to 3W on 07/26/2021. Foley replaced 07/28/2021. PEG placed 07/30/2021. Foley removed 08/09/2021. PEG pulled out by patient 08/16/2021. Delirium worsening 08/17/2021. Haldol administered. IR replaced PEG on 08/17/2021. Patient with urosepsis 08/18/2021. Culture grew E. Coli. C/o LBP 08/28/2021. Lumbar MRI demonstrated chronic degenerative changes with spinal stenosis at L3-4, L4-5, and L5-S1. ? ? LOS: 60 days  ? ?-Continue efforts at rehabilitation. ?-Goal is for SNF placement. ? ? ?Viona Gilmore, DNP, AGNP-C ?Nurse Practitioner ? ?[facility] ?[address], [address], [place], [place] [address] ?PRN:[id number]    FTJ:[id number] ? ?08/28/2021, 12:30 PM ? ? ? ? ?

## 2021-08-29 DIAGNOSIS — R7881 Bacteremia: Secondary | ICD-10-CM | POA: Diagnosis not present

## 2021-08-29 DIAGNOSIS — B9620 Unspecified Escherichia coli [E. coli] as the cause of diseases classified elsewhere: Secondary | ICD-10-CM

## 2021-08-29 DIAGNOSIS — M545 Low back pain, unspecified: Secondary | ICD-10-CM

## 2021-08-29 DIAGNOSIS — R748 Abnormal levels of other serum enzymes: Secondary | ICD-10-CM | POA: Diagnosis not present

## 2021-08-29 DIAGNOSIS — G959 Disease of spinal cord, unspecified: Secondary | ICD-10-CM | POA: Diagnosis not present

## 2021-08-29 DIAGNOSIS — R339 Retention of urine, unspecified: Secondary | ICD-10-CM

## 2021-08-29 DIAGNOSIS — G9341 Metabolic encephalopathy: Secondary | ICD-10-CM | POA: Diagnosis not present

## 2021-08-29 LAB — COMPREHENSIVE METABOLIC PANEL
ALT: 28 U/L (ref 0–44)
AST: 31 U/L (ref 15–41)
Albumin: 3 g/dL — ABNORMAL LOW (ref 3.5–5.0)
Alkaline Phosphatase: 118 U/L (ref 38–126)
Anion gap: 6 (ref 5–15)
BUN: 30 mg/dL — ABNORMAL HIGH (ref 6–20)
CO2: 30 mmol/L (ref 22–32)
Calcium: 10.3 mg/dL (ref 8.9–10.3)
Chloride: 96 mmol/L — ABNORMAL LOW (ref 98–111)
Creatinine, Ser: 0.67 mg/dL (ref 0.61–1.24)
GFR, Estimated: >60 mL/min (ref >60)
Glucose, Bld: 100 mg/dL — ABNORMAL HIGH (ref 70–99)
Potassium: 4.3 mmol/L (ref 3.5–5.1)
Sodium: 132 mmol/L — ABNORMAL LOW (ref 135–145)
Total Bilirubin: 0.2 mg/dL — ABNORMAL LOW (ref 0.3–1.2)
Total Protein: 8.1 g/dL (ref 6.5–8.1)

## 2021-08-29 LAB — CBC
HCT: 27.6 % — ABNORMAL LOW (ref 39.0–52.0)
Hemoglobin: 9 g/dL — ABNORMAL LOW (ref 13.0–17.0)
MCH: 30.6 pg (ref 26.0–34.0)
MCHC: 32.6 g/dL (ref 30.0–36.0)
MCV: 93.9 fL (ref 80.0–100.0)
Platelets: 516 10*3/uL — ABNORMAL HIGH (ref 150–400)
RBC: 2.94 MIL/uL — ABNORMAL LOW (ref 4.22–5.81)
RDW: 16.3 % — ABNORMAL HIGH (ref 11.5–15.5)
WBC: 6.4 10*3/uL (ref 4.0–10.5)
nRBC: 0 % (ref 0.0–0.2)

## 2021-08-29 LAB — GLUCOSE, CAPILLARY
Glucose-Capillary: 97 mg/dL (ref 70–99)
Glucose-Capillary: 97 mg/dL (ref 70–99)
Glucose-Capillary: 105 mg/dL — ABNORMAL HIGH (ref 70–99)
Glucose-Capillary: 107 mg/dL — ABNORMAL HIGH (ref 70–99)
Glucose-Capillary: 112 mg/dL — ABNORMAL HIGH (ref 70–99)

## 2021-08-29 LAB — MAGNESIUM: Magnesium: 1.7 mg/dL (ref 1.7–2.4)

## 2021-08-29 LAB — PHOSPHORUS: Phosphorus: 3.8 mg/dL (ref 2.5–4.6)

## 2021-08-29 MED ORDER — CYCLOBENZAPRINE HCL 10 MG PO TABS
5.0000 mg | ORAL_TABLET | Freq: Three times a day (TID) | ORAL | Status: DC | PRN
  Administered 2021-08-29 – 2021-09-11 (×14): 5 mg via ORAL
  Filled 2021-08-29 (×15): qty 1

## 2021-08-29 MED ORDER — MAGNESIUM SULFATE 2 GM/50ML IV SOLN
2.0000 g | Freq: Once | INTRAVENOUS | Status: AC
  Administered 2021-08-29: 2 g via INTRAVENOUS
  Filled 2021-08-29: qty 50

## 2021-08-29 NOTE — Progress Notes (Signed)
?PROGRESS NOTE ? ? ? ?Brian [name]  [id number] DOB: 1960-08-04 DOA: 06/29/2021 ?PCP: Pcp, No  ? 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  He was admitted on 2/3 and underwent ACDF of C3-4, C4-5, C5-5, and C6-7.  Post-operatively he was progressing slowly with residual weakness in both hands but improving lower extremity strength and function.  He developed some difficulty swallowing on 2/5 and SLP ordered and placed on thickened liquids.   Overnight 2/6, patient with increased difficulty swallowing and was made NPO but also noted to have dysarthria and difficulty moving extremities with numbness.  SLP evaluated and found to be moderate aspiration risk.   Also progressively tachycardic, tachypneic, and now febrile.  Code stroke activated and taken for CT/ CTA head and neck and was given decadron 10mg  once.  NIHSS 18.  CTH was negative for acute findings, and CTA head neck did not reveal any LVO but noted significant prevertebral soft tissue swelling with foci of gas present at the ventral epidural space at C4-5, small collection not excluded. On 2/7, patient with worsening confusion and concern for airway involvement, PCCM consulted and remained in the intensive care unit until he is transferred to the floor on 3/2 and PCCM requested transition to [facility] for medical assistance while patient remains under the neurosurgery service. ? ?Significant Hospital Events: ?2/3 ACDF C3-4, C4-5, C5-5, and C6-7 w/ Dr. Annette Stable ?2/6 SLP eval for difficulty swallowing ?2/7 Code stroke overnight, neg for LVO, CT showing soft tissue swelling.  PCCM consulted for concern of airway management  and AMS.  Went for evacuation of hematoma  ?2/18 CT Abd/Pelvis: showing bilateral segmental Pes, started on heparin and showing worsening pneumonia, febrile to  103.  PCT rising, 34.6, restarted on abx ?2/19 possible aspiration w/ severe hypoxia; intubated; Switching from heparin to angiomax given subtherapeutic levels despite increase in rate. ?2/20 Echo shows normal LV systolic function. There was notation of a + McConnell's sign c/w a large pulmonary embolus which is consistent with the finding of bilateral segmental pulmonary emboli noted on CT 07/14/2021. ?2/21 rash appreciated on back; stopped cefepim/vanc switched to zosyn; required low dose levo with increase in fentanyl ?2/22 remains intubated; unable to extubate to do increase RR and thick secretions ?2/23 [name] infusion added ?2/24: weaning fentanyl.  ?2/26 extubated ?2/28 tachycardia persists ?3/2: Modified barium swallow, continues n.p.o. with core track in place, likely will need PEG tube. ?3/6: s/p IR G-tube placement ?3/16: Foley catheter discontinued ?3/24: E. coli UTI/bacteremia>> cefazolin x7 days per ID ?Now medically clear awaiting SNF placement still but per TOC has been difficult to place. ?  ?Assessment and Plan: ?* Cervical myelopathy ([place]); severe cervical spinal stenosis with cord compression s/p ACDF [id number] on 2/3, complicated by epidural hematoma s/p evacuation 2/7 ?Patient initially presented s/p recent fall with progressive upper and lower extremity weakness and tremors, He was found to have critical multilevel cervical spinal stenosis @ C3-6 with spinal cord compression and spinal cord signal change and underwent ACDF C3-4, C4-5, C5-5, and C6/7 by neurosurgery Dr. Trenton Gammon on 06/29/2021.  Postoperative leg complicated by epidural hematoma s/p evacuation on 2/7. ?--Continue PT/OT/SLP efforts, Awaiting SNF ?--Further as per neurosurgery recommendation. ? ?Acute  respiratory failure with hypoxia ([place]) ?-Likely Multifactorial with significant  dysphagia and recurrent aspiration pneumonia events during initial hospitalization following ACDF surgery with postoperative complications of postoperative cervical hematoma.   ?-Patient did require ventilatory support in the intensive care unit and was successfully extubated on 07/22/2021.   ?-Patient completed extensive course of empiric antibiotics.  Oxygen now weaned off, Now on room air. ?-SpO2: 100 % ?O2 Flow Rate (L/min): 0 L/min ?FiO2 (%): 40 %  ?-Continue Aspiration precautions ? ? ? ? ?Acute pulmonary embolism ([place]) ?-CTA chest showed bilateral segmental PE.   ?-LE [place] negative for DVT.  TTE with normal LV systolic function.  Supplemental oxygen weaned off. ?-C/w Eliquis 5mg  BID ? ?Acute metabolic encephalopathy ?-CT head, MRI brain, EEG, TSH, B12, ammonia unrevealing.  ?-Etiology likely multifactorial with acute respiratory failure secondary to aspiration pneumonia, bilateral pulmonary embolism.  -Completed course of antibiotics for aspiration pneumonia.  ?-Seroquel and clonazepam discontinued.  -Completed course of antibiotics for E. coli UTI/septicemia ? ?E. coli bacteremia ?-Urine culture and blood cultures x2 08/17/2028 positive for E. coli.   ?-Evaluated by infectious disease.   ?-Completed 7-day course of antibiotics per ID. ? ?Severe sepsis due to recurrent aspiration pneumonia ?-Likely recurrent issue during hospitalization, remains n.p.o. due to continued aspiration risk per SLP.   ?-Completed extensive course of antibiotics while under ICU care.  ?-S/p IR gtube 3/6. ?-Continue tube feeds nocturnally  ?-Cleared for dysphagia 3 mechanical soft diet with thin liquids per SLP; but with very little oral intake per RN; and patient with little desire for oral intake ?-Continue aspiration precautions ?-Continue SLP efforts while inpatient ? ?Sinus tachycardia ?-Multifactorial including sepsis, dehydration, PE, agitation.   ?-Continue metoprolol 100 mg twice daily ?-No longer on Telemetry ? ? ? ? ?Elevated  liver enzymes ?Etiology likely secondary to sepsis from aspiration pneumonia as above.  Acute hepatitis panel negative.  RUQ ultrasound unremarkable. ?--Continue monitor LFTs intermittently but they have resolved  ? ?Hyponatremia ?-Etiology likely secondary to dehydration.  Urine sodium low.  Continue tube feeds; now cleared by speech therapy for dysphagia 3 diet. ?-Na+  Went from 136 -> 130 -> 132 ?-Continue to Monitor and Trend intermittently  ? ?Tobacco use disorder ?-Encouraged cessation. ?--Nicotine patch; decreased to 7 mg on 3/15 ? ?Physical deconditioning ?-Significant weakness in all extremities as a result of cervical myelopathy and acute illness.  Slowly improving while inpatient. ?-Continue PT/OT efforts, will need SNF placement; difficult placement per TOC  ? ?Urinary retention ?Foley catheter discontinued on 08/09/2021 with no current signs of urinary retention. ?--Continue bethanechol ?--Continue bladder scan as needed ?--Continue monitor urinary output and strict I's and O's ?-Patient is +657 mL since admission ? ?Protein-calorie malnutrition, severe ([place]) ?As evidenced by poor p.o. intake, significant muscle mass and subcu fat loss and significant weight loss (about 16 pounds since this hospitalization). ?Nutrition Problem: Severe Malnutrition (in the context of social/environmental circumstances) ?Etiology:  (inadequate energy intake) ?Signs/Symptoms: mild fat depletion, severe muscle depletion, severe muscle depletion ?Interventions: Refer to RD note for recommendations  ?--Dietitian following, appreciate assistance.   ?--s/p IR gtube 3/6.  ?--Now cleared for dysphagia 3 diet ?--Continue tube feeds ?--continue SLP efforts ? ?Lumbar back pain ?-Patient complaining of low back pain.  Denies focal weakness, no paresthesias.  Neurovascularly intact on physical exam.  MR L-spine with mild stenosis L3/4 with potential for neural compression lateral recesses, moderate-severe stenosis L4-5 that could cause  neural compression, L5-S1 with bilateral foraminal stenosis left worse than right. ?-Continue PT/OT  as they are still recommending SNF ? ?Malnutrition of moderate degree-resolved as of 07/12/2021 ?Nut

## 2021-08-29 NOTE — Progress Notes (Signed)
Patient found on the floor by secretary [place], bed had three side rails, pt said callbell was stucked and trying to get it out, then fell on the floor, no injury noted, neuro intact, Vts Wnl, paged oncall provider Dr. Leafy Half, and called pt's son Mr. Thurl Boen and left voice message for call back, and will continue to monitor pt closely. ?

## 2021-08-29 NOTE — Assessment & Plan Note (Addendum)
Nutrition Status: ?Nutrition Problem: Severe Malnutrition ?Etiology: social / environmental circumstances ?Signs/Symptoms: severe muscle depletion, severe fat depletion ?Interventions: Tube feeding, Prostat, MVI ?-Albumin Level is 3.4 on last check ?

## 2021-08-29 NOTE — Plan of Care (Signed)
?  Problem: Education: ?Goal: Ability to verbalize activity precautions or restrictions will improve ?Outcome: Progressing ?Goal: Knowledge of the prescribed therapeutic regimen will improve ?Outcome: Progressing ?Goal: Understanding of discharge needs will improve ?Outcome: Progressing ?  ?Problem: Activity: ?Goal: Ability to avoid complications of mobility impairment will improve ?Outcome: Progressing ?Goal: Ability to tolerate increased activity will improve ?Outcome: Progressing ?Goal: Will remain free from falls ?Outcome: Progressing ?  ?Problem: Bowel/Gastric: ?Goal: Gastrointestinal status for postoperative course will improve ?Outcome: Progressing ?  ?Problem: Clinical Measurements: ?Goal: Ability to maintain clinical measurements within normal limits will improve ?Outcome: Progressing ?Goal: Postoperative complications will be avoided or minimized ?Outcome: Progressing ?Goal: Diagnostic test results will improve ?Outcome: Progressing ?  ?Problem: Skin Integrity: ?Goal: Will show signs of wound healing ?Outcome: Progressing ?  ?Problem: Bladder/Genitourinary: ?Goal: Urinary functional status for postoperative course will improve ?Outcome: Progressing ?  ?Problem: Safety: ?Goal: Ability to remain free from injury will improve ?Outcome: Progressing ?  ?Problem: Clinical Measurements: ?Goal: Respiratory complications will improve ?Outcome: Progressing ?Goal: Cardiovascular complication will be avoided ?Outcome: Progressing ?  ?Problem: Activity: ?Goal: Risk for activity intolerance will decrease ?Outcome: Progressing ?  ?Problem: Nutrition: ?Goal: Adequate nutrition will be maintained ?Outcome: Progressing ?  ?Problem: Coping: ?Goal: Level of anxiety will decrease ?Outcome: Progressing ?  ?

## 2021-08-29 NOTE — Progress Notes (Signed)
Overall stable.  Continues to make slow steady progress.  Becoming more independent. ? ?No new medical issues ongoing.  Continue efforts at placement.  If patient gets much better may have to consider home discharge. ?

## 2021-08-30 DIAGNOSIS — R748 Abnormal levels of other serum enzymes: Secondary | ICD-10-CM | POA: Diagnosis not present

## 2021-08-30 DIAGNOSIS — G959 Disease of spinal cord, unspecified: Secondary | ICD-10-CM | POA: Diagnosis not present

## 2021-08-30 DIAGNOSIS — G9341 Metabolic encephalopathy: Secondary | ICD-10-CM | POA: Diagnosis not present

## 2021-08-30 DIAGNOSIS — R7881 Bacteremia: Secondary | ICD-10-CM | POA: Diagnosis not present

## 2021-08-30 DIAGNOSIS — W19XXXA Unspecified fall, initial encounter: Secondary | ICD-10-CM

## 2021-08-30 LAB — GLUCOSE, CAPILLARY
Glucose-Capillary: 109 mg/dL — ABNORMAL HIGH (ref 70–99)
Glucose-Capillary: 115 mg/dL — ABNORMAL HIGH (ref 70–99)
Glucose-Capillary: 118 mg/dL — ABNORMAL HIGH (ref 70–99)
Glucose-Capillary: 120 mg/dL — ABNORMAL HIGH (ref 70–99)
Glucose-Capillary: 124 mg/dL — ABNORMAL HIGH (ref 70–99)
Glucose-Capillary: 124 mg/dL — ABNORMAL HIGH (ref 70–99)
Glucose-Capillary: 88 mg/dL (ref 70–99)

## 2021-08-30 MED ORDER — SODIUM CHLORIDE 0.9 % IV BOLUS
250.0000 mL | Freq: Once | INTRAVENOUS | Status: AC
Start: 2021-08-30 — End: 2021-08-30
  Administered 2021-08-30: 250 mL via INTRAVENOUS

## 2021-08-30 NOTE — Assessment & Plan Note (Addendum)
-  Larey Seat out of bed overnight on 08/29/21 without injury; no loss of consciousness and it was unwitnessed but denies any head trauma and only nursing assessment did not exhibit any focal deficits and at baseline mentation. ?-Had another fall on the evening of 09/16/21 as well as another fall 09/19/2021 ?-Continue fall precautions.   ?

## 2021-08-30 NOTE — Progress Notes (Signed)
? ?  Providing Compassionate, Quality Care - Together ? ? ?Subjective: ?Patient reports he fell out of bed last night, but did not get hurt. ? ?Objective: ?Vital signs in last 24 hours: ?Temp:  [97.7 ?F (36.5 ?C)-98.9 ?F (37.2 ?C)] 97.7 ?F (36.5 ?C) (04/06 0735) ?Pulse Rate:  [78-102] 81 (04/06 0735) ?Resp:  [14-20] 16 (04/06 0735) ?BP: (88-117)/(68-85) 88/68 (04/06 0735) ?SpO2:  [98 %-100 %] 100 % (04/06 0735) ?Weight:  [50.9 kg] 50.9 kg (04/06 0500) ? ?Intake/Output from previous day: ?04/05 0701 - 04/06 0700 ?In: 160  ?Out: [place] [id number] ?Intake/Output this shift: ?No intake/output data recorded. ? ?Alert; oriented to person, place, and time ?MAE, Generalized weakness secondary to deconditioning ?Decreased fine motor BUE ?Incision is healing well ? ?Lab Results: ?Recent Labs  ?  08/29/21 ?0421  ?WBC 6.4  ?HGB 9.0*  ?HCT 27.6*  ?PLT 516*  ? ?BMET ?Recent Labs  ?  08/29/21 ?0421  ?NA 132*  ?K 4.3  ?CL 96*  ?CO2 30  ?GLUCOSE 100*  ?BUN 30*  ?CREATININE 0.67  ?CALCIUM 10.3  ? ? ?Studies/Results: ?No results found. ? ?Assessment/Plan: ?Patient status post C3-4, C4-5, C5-6, C6-7 anterior cervical discectomy with interbody fusion by Dr. Annette Stable on 06/29/2021. Increased difficulty swallowing with lung atelectasis and elevated temperatures on 07/02/2021. Made NPO following MBS by SLP. 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 CTA head and neck negative for stroke. MRI revealed epidural hematoma. Patient underwent exploration of his cervical fusion with evacuation of the epidural hematoma on 07/03/2021. Cortrak was placed on 07/04/2021. Patient's strength much improved since epidural hematoma evacuation. Cortrak removed 07/06/2021 and dysphagia 2 diet with nectar thick liquids started. Patient with delirium vs ETOH withdrawal. Discontinued steroids 07/06/2021. CIWA protocol started and discontinued 07/07/2021. CT and MRI 07/07/2021 negative. Patient developed respiratory distress,  requiring intubation on 07/15/2021. Work up revealed aspiration PNA and PE. Patient was extubated on 07/22/2021. Foley catheter removed 07/24/2021. Patient transferred to 3W on 07/26/2021. Foley replaced 07/28/2021. PEG placed 07/30/2021. Foley removed 08/09/2021. PEG pulled out by patient 08/16/2021. Delirium worsening 08/17/2021. Haldol administered. IR replaced PEG on 08/17/2021. Patient with urosepsis 08/18/2021. Culture grew E. Coli. C/o LBP 08/28/2021. Lumbar MRI demonstrated chronic degenerative changes with spinal stenosis at L3-4, L4-5, and L5-S1. Patient suffered a fall overnight 08/29/2021. ? ? LOS: 62 days  ? ?-Continue efforts at rehabilitation. ?-Goal is for SNF placement. ? ? ?Viona Gilmore, DNP, AGNP-C ?Nurse Practitioner ? ?[facility] ?[address], [address], [place], [place] [address] ?[id number]    [id number] ? ?08/30/2021, 10:14 AM ? ? ? ? ?

## 2021-08-30 NOTE — Progress Notes (Signed)
Occupational Therapy Treatment ?Patient Details ?Name: Brian [name] ?MRN: [id number] ?DOB: March 29, 1961 ?Today's Date: 08/30/2021 ? ? ?History of present illness Pt is a 61 y/o male admitted 06/29/21 following C3-7 ACDF after progressive cervical myelopathy symptoms including ataxia, bil UE and LE shaking and weakness, and loss of function in his hands with subsequent fall. Pt initially had improvement in neurological symptoms, but postoperative course was complicated by dysphasia and ataxia secondary to and epidural hematoma with evacuation of hematoma on 2/7. Course complicated by delirium secondary to encephalopathy, sepsis secondary to aspiration PNA, reintubated 2/19-2/26 for airway protection, bilat segmental PEs 2/18. PEG placed 3/6. PMHx includes tobacco and EtOH use disorder. ?  ?OT comments ? Patient received in bed and eager to participate with OT. Donning socks addressed at bed level with patient able to assist with left sock with pulling up after placed on foot. Patient performed transfer from EOB to recliner with face to face technique and max assist. Standing performed from recliner using RW with max assist and limited tolerance. Patient instructed in BUE AROM exercises for shoulder and level one therapy band for elbow flexion/extension. Patient performed therapy putty exercises with improved finger flexibility. Acute OT to continue to follow.   ? ?Recommendations for follow up therapy are one component of a multi-disciplinary discharge planning process, led by the attending physician.  Recommendations may be updated based on patient status, additional functional criteria and insurance authorization. ?   ?Follow Up Recommendations ? Skilled nursing-short term rehab (<3 hours/day)  ?  ?Assistance Recommended at Discharge Frequent or constant Supervision/Assistance  ?Patient can return home with the following ? Two people to help with walking and/or transfers;Direct supervision/assist for medications  management;Assist for transportation;Help with stairs or ramp for entrance;Assistance with feeding;Two people to help with bathing/dressing/bathroom;Assistance with cooking/housework;Direct supervision/assist for financial management ?  ?Equipment Recommendations ? Wheelchair (measurements OT);Wheelchair cushion (measurements OT);Hospital bed;BSC/3in1;Tub/shower seat  ?  ?Recommendations for Other Services   ? ?  ?Precautions / Restrictions Precautions ?Precautions: Fall ?Required Braces or Orthoses: Cervical Brace (has been d/c'd) ?Restrictions ?Weight Bearing Restrictions: No  ? ? ?  ? ?Mobility Bed Mobility ?Overal bed mobility: Needs Assistance ?Bed Mobility: Sidelying to Sit ?  ?Sidelying to sit: Min assist ?  ?  ?  ?General bed mobility comments: cues for rail use and increased time ?  ? ?Transfers ?Overall transfer level: Needs assistance ?Equipment used: None ?Transfers: Sit to/from Stand, Bed to chair/wheelchair/BSC ?Sit to Stand: Max assist ?Stand pivot transfers: Max assist ?  ?  ?  ?  ?General transfer comment: face to face transfer to recliner with patient demonstrating good upright posture during transfer ?  ?  ?Balance Overall balance assessment: Needs assistance ?Sitting-balance support: No upper extremity supported ?Sitting balance-Leahy Scale: Fair ?  ?Postural control: Posterior lean ?Standing balance support: Bilateral upper extremity supported ?Standing balance-Leahy Scale: Poor ?Standing balance comment: stood for transfer and stood from recliner with max assist and used RW for support ?  ?  ?  ?  ?  ?  ?  ?  ?  ?  ?  ?   ? ?ADL either performed or assessed with clinical judgement  ? ?ADL Overall ADL's : Needs assistance/impaired ?Eating/Feeding: Minimal assistance ?Eating/Feeding Details (indicate cue type and reason): able to manage cup ?Grooming: Dance movement psychotherapist;Wash/dry hands;Minimal assistance;Sitting ?Grooming Details (indicate cue type and reason): able to wash face using both hands ?  ?   ?  ?  ?  ?  ?Lower Body  Dressing: Maximal assistance;Moderate assistance;Bed level ?Lower Body Dressing Details (indicate cue type and reason): mod assist to donn left sock and max assist for right ?  ?  ?  ?  ?  ?  ?  ?General ADL Comments: patient was able to assist with donning left socks with sock put on up to mid foot ?  ? ?Extremity/Trunk Assessment Upper Extremity Assessment ?RUE Deficits / Details: limited shoulder flexion, quickly fatigues ?LUE Deficits / Details: limited shoulder flexion ?  ?  ?  ?  ?  ? ?Vision   ?  ?  ?Perception   ?  ?Praxis   ?  ? ?Cognition Arousal/Alertness: Awake/alert ?Behavior During Therapy: [facility] for tasks assessed/performed ?Overall Cognitive Status: No family/caregiver present to determine baseline cognitive functioning ?Area of Impairment: Problem solving, Awareness ?  ?  ?  ?  ?  ?  ?  ?  ?Orientation Level: Disoriented to, Time, Situation ?Current Attention Level: Sustained ?Memory: Decreased short-term memory, Decreased recall of precautions ?Following Commands: Follows one step commands consistently ?  ?Awareness: Emergent ?Problem Solving: Requires verbal cues, Requires tactile cues, Difficulty sequencing ?  ?  ?  ?   ?Exercises Exercises: General Upper Extremity, Other exercises ?General Exercises - Upper Extremity ?Shoulder Flexion: AROM, Both, 10 reps, Seated ?Shoulder Extension: AROM, Both, 10 reps, Seated ?Shoulder ABduction: AROM, Both, 10 reps, Seated ?Shoulder ADduction: AROM, Both, 10 reps, Seated ?Elbow Flexion: Strengthening, Both, 10 reps, Seated, Theraband ?Theraband Level (Elbow Flexion): Level 1 (Yellow) ?Elbow Extension: Strengthening, Both, 10 reps, Seated, Theraband ?Theraband Level (Elbow Extension): Level 1 (Yellow) ?Other Exercises ?Other Exercises: yellow putty exercises for BUE grip and pinch strengthening ? ?  ?Shoulder Instructions   ? ? ?  ?General Comments    ? ? ?Pertinent Vitals/ Pain       Pain Assessment ?Pain Assessment: No/denies  pain ?Pain Intervention(s): Monitored during session ? ?Home Living   ?  ?  ?  ?  ?  ?  ?  ?  ?  ?  ?  ?  ?  ?  ?  ?  ?  ?  ? ?  ?Prior Functioning/Environment    ?  ?  ?  ?   ? ?Frequency ? Min 2X/week  ? ? ? ? ?  ?Progress Toward Goals ? ?OT Goals(current goals can now be found in the care plan section) ? Progress towards OT goals: Progressing toward goals ? ?Acute Rehab OT Goals ?Patient Stated Goal: get better ?OT Goal Formulation: With patient ?Time For Goal Achievement: 09/05/21 ?Potential to Achieve Goals: Fair ?ADL Goals ?Pt Will Perform Eating: with min assist;with assist to don/doff brace/orthosis;with adaptive utensils;sitting ?Pt Will Perform Grooming: sitting;with min assist ?Pt Will Perform Upper Body Bathing: with mod assist;sitting ?Pt Will Transfer to Toilet: with min assist;stand pivot transfer;bedside commode ?Pt/caregiver will Perform Home Exercise Program: Increased ROM;Increased strength;Both right and left upper extremity;With theraputty;With minimal assist;With written HEP provided ?Additional ADL Goal #1: Pt will grasp objects with R hand and bring to mouth 10/10 trials in prep for self feeding ?Additional ADL Goal #2: Pt will grasp/release 5/5 objects with L hand to increase indep with ADLs  ?Plan Discharge plan remains appropriate   ? ?Co-evaluation ? ? ?   ?  ?  ?  ?  ? ?  ?AM-PAC OT "6 Clicks" Daily Activity     ?Outcome Measure ? ? Help from another person eating meals?: A Lot ?Help from another person taking care of  personal grooming?: A Lot ?Help from another person toileting, which includes using toliet, bedpan, or urinal?: A Lot ?Help from another person bathing (including washing, rinsing, drying)?: A Lot ?Help from another person to put on and taking off regular upper body clothing?: A Lot ?Help from another person to put on and taking off regular lower body clothing?: A Lot ?6 Click Score: 12 ? ?  ?End of Session Equipment Utilized During Treatment: Gait belt;Rolling walker (2  wheels) ? ?OT Visit Diagnosis: Other abnormalities of gait and mobility (R26.89);Muscle weakness (generalized) (M62.81);Feeding difficulties (R63.3) ?  ?Activity Tolerance Patient tolerated treatment well ?  ?Patient

## 2021-08-30 NOTE — Progress Notes (Signed)
?PROGRESS NOTE ? ? ? ?Brian [name]  [id number] DOB: 08-01-1960 DOA: 06/29/2021 ?PCP: Pcp, No  ? 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  He was admitted on 2/3 and underwent ACDF of C3-4, C4-5, C5-5, and C6-7.  Post-operatively he was progressing slowly with residual weakness in both hands but improving lower extremity strength and function.  He developed some difficulty swallowing on 2/5 and SLP ordered and placed on thickened liquids.   Overnight 2/6, patient with increased difficulty swallowing and was made NPO but also noted to have dysarthria and difficulty moving extremities with numbness.  SLP evaluated and found to be moderate aspiration risk.   Also progressively tachycardic, tachypneic, and now febrile.  Code stroke activated and taken for CT/ CTA head and neck and was given decadron 10mg  once.  NIHSS 18.  CTH was negative for acute findings, and CTA head neck did not reveal any LVO but noted significant prevertebral soft tissue swelling with foci of gas present at the ventral epidural space at C4-5, small collection not excluded. On 2/7, patient with worsening confusion and concern for airway involvement, PCCM consulted and remained in the intensive care unit until he is transferred to the floor on 3/2 and PCCM requested transition to [facility] for medical assistance while patient remains under the neurosurgery service. ? ?Significant Hospital Events: ?2/3 ACDF C3-4, C4-5, C5-5, and C6-7 w/ Dr. Annette [name] ?2/6 SLP eval for difficulty swallowing ?2/7 Code stroke overnight, neg for LVO, CT showing soft tissue swelling.  PCCM consulted for concern of airway management  and AMS.  Went for evacuation of hematoma  ?2/18 CT Abd/Pelvis: showing bilateral segmental Pes, started on heparin and showing worsening pneumonia, febrile to  103.  PCT rising, 34.6, restarted on abx ?2/19 possible aspiration w/ severe hypoxia; intubated; Switching from heparin to angiomax given subtherapeutic levels despite increase in rate. ?2/20 Echo shows normal LV systolic function. There was notation of a + McConnell's sign c/w a large pulmonary embolus which is consistent with the finding of bilateral segmental pulmonary emboli noted on CT 07/14/2021. ?2/21 rash appreciated on back; stopped cefepim/vanc switched to zosyn; required low dose levo with increase in fentanyl ?2/22 remains intubated; unable to extubate to do increase RR and thick secretions ?2/23 [name] infusion added ?2/24: weaning fentanyl.  ?2/26 extubated ?2/28 tachycardia persists ?3/2: Modified barium swallow, continues n.p.o. with core track in place, likely will need PEG tube. ?3/6: s/p IR G-tube placement ?3/16: Foley catheter discontinued ?3/24: E. coli UTI/bacteremia>> cefazolin x7 days per ID ?Now medically clear awaiting SNF placement still but per TOC has been difficult to place. ?4/5-4/6 Had a fall overnight and fell out of the bed and was unwitnessed but patient states he rolled out of the bed and did not get hurt. Has fall precautions now. ?  ? ? ?Assessment and Plan: ?* Cervical myelopathy ([place]); severe cervical spinal stenosis with cord compression s/p ACDF [id number] on 2/3, complicated by epidural hematoma s/p evacuation 2/7 ?Patient initially presented s/p recent fall with progressive upper and lower extremity weakness and tremors, He was found to have critical multilevel cervical spinal stenosis @ C3-6 with spinal cord compression and spinal cord signal change and underwent ACDF  C3-4, C4-5, C5-5, and C6/7 by neurosurgery Dr. Trenton [name] on 06/29/2021.  Postoperative leg complicated by epidural hematoma s/p evacuation on 2/7. ?--Continue  PT/OT/SLP efforts, Awaiting SNF ?--Further as per neurosurgery recommendation. ? ?Acute respiratory failure with hypoxia ([place]) ?-Likely Multifactorial with significant dysphagia and recurrent aspiration pneumonia events during initial hospitalization following ACDF surgery with postoperative complications of postoperative cervical hematoma.   ?-Patient did require ventilatory support in the intensive care unit and was successfully extubated on 07/22/2021.   ?-Patient completed extensive course of empiric antibiotics.  Oxygen now weaned off, Now on room air. ?-SpO2: 100 % ?O2 Flow Rate (L/min): 0 L/min ?FiO2 (%): 40 %  ?-Continue Aspiration precautions ? ? ? ? ?Acute pulmonary embolism ([place]) ?-CTA chest showed bilateral segmental PE.   ?-LE [place] negative for DVT.  TTE with normal LV systolic function.  Supplemental oxygen weaned off. ?-C/w Eliquis 5mg  BID ? ?Acute metabolic encephalopathy ?-CT head, MRI brain, EEG, TSH, B12, ammonia unrevealing.  ?-Etiology likely multifactorial with acute respiratory failure secondary to aspiration pneumonia, bilateral pulmonary embolism.  -Completed course of antibiotics for aspiration pneumonia.  ?-Seroquel and clonazepam discontinued.  -Completed course of antibiotics for E. coli UTI/septicemia ? ?E. coli bacteremia ?-Urine culture and blood cultures x2 08/17/2028 positive for E. coli.   ?-Evaluated by infectious disease.   ?-Completed 7-day course of antibiotics per ID. ? ?Severe sepsis due to recurrent aspiration pneumonia ?-Likely recurrent issue during hospitalization, remains n.p.o. due to continued aspiration risk per SLP.   ?-Completed extensive course of antibiotics while under ICU care.  ?-S/p IR gtube 3/6. ?-Continue tube feeds nocturnally  ?-Cleared for dysphagia 3 mechanical soft diet with thin liquids per SLP; but with very little oral intake per RN; and patient with little desire for oral intake ?-Continue aspiration precautions ?-Continue SLP efforts while  inpatient ? ?Sinus tachycardia ?-Multifactorial including sepsis, dehydration, PE, agitation.   ?-Continue Metoprolol 100 mg twice daily ?-No longer on Telemetry ? ? ? ? ?Elevated liver enzymes ?-Etiology likely secondary to sepsis from aspiration pneumonia as above.  Acute hepatitis panel negative.  RUQ ultrasound unremarkable. ?--Continue monitor LFTs intermittently but they have resolved and AST was 31 and ALT was 28 ? ?Hyponatremia ?-Etiology likely secondary to dehydration.  Urine sodium low.  Continue tube feeds; now cleared by speech therapy for dysphagia 3 diet. ?-Na+  Went from 136 -> 130 -> 132 ?-Continue to Monitor and Trend intermittently  ? ?Tobacco use disorder ?-Encouraged cessation. ?-Nicotine patch; decreased to 7 mg on 3/15 ? ?Physical deconditioning ?-Significant weakness in all extremities as a result of cervical myelopathy and acute illness.  Slowly improving while inpatient. ?-Continue PT/OT efforts, will need SNF placement; difficult placement per TOC  ? ?Urinary retention ?Foley catheter discontinued on 08/09/2021 with no current signs of urinary retention. ?--Continue bethanechol ?--Continue bladder scan as needed ?--Continue monitor urinary output and strict I's and O's ?-Patient is -865 mL since admission ?-Foley has been removed and now has a condom cath ? ?Protein-calorie malnutrition, severe ([place]) ?As evidenced by poor p.o. intake, significant muscle mass and subcu fat loss and significant weight loss (about 16 pounds since this hospitalization). ?Nutrition Problem: Severe Malnutrition (in the context of social/environmental circumstances) ?Etiology:  (inadequate energy intake) ?Signs/Symptoms: mild fat depletion, severe muscle depletion, severe muscle depletion ?Interventions: Refer to RD note for recommendations  ?--Dietitian following, appreciate assistance.   ?--s/p IR gtube 3/6.  ?--Now cleared for dysphagia 3 diet ?--Continue tube feeds ?--continue SLP efforts ? ?Fall during  current  hospitalization ?-[place] out of bed  ?-Did not lose consciousness and it was unwitnessed but denies any head trauma and only nursing assessment did not exhibit any focal deficits and at baseline mentation. ?-No

## 2021-08-30 NOTE — Progress Notes (Signed)
Speech Language Pathology Treatment: Dysphagia  ?Patient Details ?Name: Brian [name] ?MRN: [id number] ?DOB: 1960/09/14 ?Today's Date: 08/30/2021 ?Time: [id number] ?SLP Time Calculation (min) (ACUTE ONLY): 16 min ? ?Assessment / Plan / Recommendation ?Clinical Impression ? Pt seen for dysphagia with expiratory muscle strength trainer device to strengthen respiratory and upper airway musculature for safe swallow. He declined breakfast and explained that he eats very little at baseline- derives little pleasure from food and eats to fuel his body only.  ?He used the EMST device set at 10 cm H20 pressure for 3 sets of 10 repetitions. Verbal assist on proper technique to achieve adequate force on exhalation needed initially fading to minimal cues. Encouraged him to use device 2-3 times during the day although uncertain if he is doing this. Pt appears depressed and states "I just lay around here and I am used to being on the go. I would love to go outside. Want to walk."  ?Continue ST for dysphagia.  ?  ?HPI HPI: Pt is a 61 y/o admitted 06/29/21 following C3-7 ACDF after progressive cervical myelopathy symptoms with subsequent fall and severe incomplete SCI. Significant prevertebral swelling per imaging. Pt developed some difficulty swallowing on 2/5 and SLP consulted. MBS 2/6 revealed severe edema s/p ACDF, oral holding, a pharyngeal delay, inconsistent hyolaryngeal elevation, incomplete epiglottic inversion due to edema. Aspiration noted accross consistencies and and NPO status was recommended. Repeat MBS 2/10 showed significantly reduced pharyngeal edema and a dysphagia 2 diet with nectar thick liquids was initiated. Pt transitioned to thin liquids 2/16 and susbequently returned to nectar thick 2/17 due to concern for aspiration. Pt developed pna, possible aspiration, requiring intubation 2/19-26. Cortrak placed 2/20. PEG placed in IR on 3/6. No significant PMH. This is pt's 3rd MBS ?  ?   ?SLP Plan ? Continue with  current plan of care ? ?  ?  ?Recommendations for follow up therapy are one component of a multi-disciplinary discharge planning process, led by the attending physician.  Recommendations may be updated based on patient status, additional functional criteria and insurance authorization. ?  ? ?Recommendations  ?Diet recommendations: Dysphagia 3 (mechanical soft);Nectar-thick liquid ?Liquids provided via: Cup;Straw ?Medication Administration: Whole meds with puree ?Supervision: Staff to assist with self feeding;Full supervision/cueing for compensatory strategies ?Compensations: Slow rate;Small sips/bites ?Postural Changes and/or Swallow Maneuvers: Seated upright 90 degrees  ?   ?    ?   ? ? ? ? Oral Care Recommendations: Oral care BID ?Follow Up Recommendations: Skilled nursing-short term rehab (<3 hours/day) ?Assistance recommended at discharge: Intermittent Supervision/Assistance ?SLP Visit Diagnosis: Dysphagia, oropharyngeal phase (R13.12) ?Plan: Continue with current plan of care ? ? ? ? ?  ?  ? ? ?Houston Siren ? ?08/30/2021, 9:18 AM ?

## 2021-08-30 NOTE — Progress Notes (Signed)
Physical Therapy Treatment ?Patient Details ?Name: Brian [name] ?MRN: [id number] ?DOB: [birth year] ?Today's Date: 08/30/2021 ? ? ?History of Present Illness Pt is a 61 y/o male admitted 06/29/21 following C3-7 ACDF after progressive cervical myelopathy symptoms including ataxia, bil UE and LE shaking and weakness, and loss of function in his hands with subsequent fall. Pt initially had improvement in neurological symptoms, but postoperative course was complicated by dysphasia and ataxia secondary to and epidural hematoma with evacuation of hematoma on 2/7. Course complicated by delirium secondary to encephalopathy, sepsis secondary to aspiration PNA, reintubated 2/19-2/26 for airway protection, bilat segmental PEs 2/18. PEG placed 3/6. PMHx includes tobacco and EtOH use disorder. ? ?  ?PT Comments  ? ? Focus of session today was transfers, standing balance, short distance gaits, and LE coordination. The patient tolerated well and shows good potential for improvement. He was able to perform STS with mod-max A and perform 4 bouts of short distance gait. He required max A for truncal support, UE support from rail, and external cuing to keep his feet from crossing. He would progress to mod A near the end of each bout once he got more familiar with the pattern. He was also able to perform toe taps to the 4" step with B UE support, max A trunk support, and max A stance leg knee support. He shows overall improvement in exercise tolerance and cognition after gait training. Overall LE strength, coordination, and static, and dynamic balance are still limiting function. Pt. Would benefit from skilled PT to continue to address his functional mobility, standing tolerance, coordination, and gait. Plan and discharge setting remains unchanged. Pt to follow acutely as appropriate.  ?   ?Recommendations for follow up therapy are one component of a multi-disciplinary discharge planning process, led by the attending physician.   Recommendations may be updated based on patient status, additional functional criteria and insurance authorization. ? ?Follow Up Recommendations ? Skilled nursing-short term rehab (<3 hours/day) (potential AIR candidate if family can provide support) ?  ?  ?Assistance Recommended at Discharge Frequent or constant Supervision/Assistance  ?Patient can return home with the following Two people to help with walking and/or transfers;A lot of help with bathing/dressing/bathroom;Assistance with cooking/housework;Assistance with feeding;Direct supervision/assist for medications management;Direct supervision/assist for financial management;Assist for transportation;Help with stairs or ramp for entrance ?  ?Equipment Recommendations ? Wheelchair (measurements PT);Wheelchair cushion (measurements PT);Hospital bed  ?  ?Recommendations for Other Services   ? ? ?  ?Precautions / Restrictions Precautions ?Precautions: Fall ?Precaution Booklet Issued: No ?Required Braces or Orthoses: Cervical Brace (has been d/c'd) ?Restrictions ?Weight Bearing Restrictions: No  ?  ? ?Mobility ? Bed Mobility ?  ?  ?  ?  ?  ?  ?  ?General bed mobility comments: Pt. in chair upon PT arrival ?  ? ?Transfers ?Overall transfer level: Needs assistance ?Equipment used: Rolling walker (2 wheels), None ?Transfers: Sit to/from Stand ?Sit to Stand: Max assist ?  ?  ?  ?  ?  ?General transfer comment: Mod A for cuing and power up, originally tried with RW then adjusted to railing on R side and SPT on left side providing support ?  ? ?Ambulation/Gait ?Ambulation/Gait assistance: +2 physical assistance, Max assist ?Gait Distance (Feet): 30 Feet (3x10' with rail + additional 10 feet with RW) ?Assistive device: Rolling walker (2 wheels) ?Gait Pattern/deviations: Decreased stride length, Step-to pattern, Ataxic, Decreased dorsiflexion - left, Decreased dorsiflexion - right, Scissoring, Knee flexed in stance - right, Knee flexed in stance -  left ?Gait velocity:  Decreased ?  ?  ?General Gait Details: Using railling on R side and SPT on L. External tape cues to limit scissoring and verbal cues for R foot toward wall. Origninally Max A and progressing to Mod A near the end of each bout. A for balance and cuing for foot placement and weight shifting. ? ? ?Stairs ?  ?  ?  ?  ?  ? ? ?Wheelchair Mobility ?  ? ?Modified Rankin (Stroke Patients Only) ?  ? ? ?  ?Balance Overall balance assessment: Needs assistance ?Sitting-balance support: No upper extremity supported ?Sitting balance-Leahy Scale: Fair ?Sitting balance - Comments: Able to maintain ?  ?  ?Standing balance-Leahy Scale: Poor ?Standing balance comment: stood with UE support and max-mod A from SPT providing truncal support during all static and dynamic activities ?  ?  ?  ?  ?  ?  ?  ?  ?  ?  ?  ?  ? ?  ?Cognition Arousal/Alertness: Awake/alert ?Behavior During Therapy: [facility] for tasks assessed/performed ?Overall Cognitive Status: No family/caregiver present to determine baseline cognitive functioning ?  ?  ?  ?  ?  ?  ?  ?  ?  ?  ?  ?  ?Following Commands: Follows one step commands consistently, Follows multi-step commands consistently ?  ?Awareness: Emergent ?Problem Solving: Requires verbal cues, Requires tactile cues ?General Comments: Pt. excited to mobilize and in good spirits at the end of the session. States "I am going to walk out of here" ?  ?  ? ?  ?Exercises Other Exercises ?Other Exercises: Standing toe taps on small steps x5B Max A standing balance and weight shifting cues. Max A for standing leg knee support ? ?  ?General Comments   ?  ?  ? ?Pertinent Vitals/Pain Pain Assessment ?Pain Assessment: No/denies pain  ? ? ?Home Living   ?  ?  ?  ?  ?  ?  ?  ?  ?  ?   ?  ?Prior Function    ?  ?  ?   ? ?PT Goals (current goals can now be found in the care plan section) Acute Rehab PT Goals ?Patient Stated Goal: walk in 3 weeks ?PT Goal Formulation: With patient ?Time For Goal Achievement: 09/04/21 ?Potential to  Achieve Goals: Fair ?Progress towards PT goals: Progressing toward goals ? ?  ?Frequency ? ? ? Min 5X/week ? ? ? ?  ?PT Plan Current plan remains appropriate;Frequency needs to be updated  ? ? ?Co-evaluation   ?  ?  ?  ?  ? ?  ?AM-PAC PT "6 Clicks" Mobility   ?Outcome Measure ? Help needed turning from your back to your side while in a flat bed without using bedrails?: A Little ?Help needed moving from lying on your back to sitting on the side of a flat bed without using bedrails?: A Little ?Help needed moving to and from a bed to a chair (including a wheelchair)?: A Lot ?Help needed standing up from a chair using your arms (e.g., wheelchair or bedside chair)?: A Lot ?Help needed to walk in hospital room?: Total ?Help needed climbing 3-5 steps with a railing? : Total ?6 Click Score: 12 ? ?  ?End of Session Equipment Utilized During Treatment: Gait belt ?Activity Tolerance: Patient tolerated treatment well ?Patient left: with call bell/phone within reach;with chair alarm set;in chair ?Nurse Communication: Mobility status ?PT Visit Diagnosis: Muscle weakness (generalized) (M62.81);Other symptoms and signs involving the nervous  system (R29.898);Difficulty in walking, not elsewhere classified (R26.2);Other abnormalities of gait and mobility (R26.89) ?  ? ? ?Time: [id number] ?PT Time Calculation (min) (ACUTE ONLY): 28 min ? ?Charges:  $Neuromuscular Re-education: 23-37 mins          ?          ? ?Thermon Leyland, SPT ?Acute Rehab Services ? ? ? ?Thermon Leyland ?08/30/2021, 4:18 PM ? ?

## 2021-08-30 NOTE — Progress Notes (Addendum)
HOSPITAL MEDICINE OVERNIGHT EVENT NOTE   ? ?Notified by nursing that patient has fallen out of bed. ? ?While fall was unwitnessed, patient denies loss of consciousness.  Patient denies head trauma.  On nursing assessment, patient exhibits no focal neurologic deficit.  Patient is at baseline mentation.  Patient denies head trauma or pain. ? ?While patient is on chronic Eliquis I do not believe that there is any indication for radiographic imaging at this time. ? ?Bed alarm placed, patient instructed to remain in bed unless assisted.  Continuing to monitor closely. ? ?Vernelle Emerald  MD ?[facility]  ? ? ? ? ? ? ? ? ? ? ?

## 2021-08-31 DIAGNOSIS — R748 Abnormal levels of other serum enzymes: Secondary | ICD-10-CM | POA: Diagnosis not present

## 2021-08-31 DIAGNOSIS — G9341 Metabolic encephalopathy: Secondary | ICD-10-CM | POA: Diagnosis not present

## 2021-08-31 DIAGNOSIS — G959 Disease of spinal cord, unspecified: Secondary | ICD-10-CM | POA: Diagnosis not present

## 2021-08-31 DIAGNOSIS — R7881 Bacteremia: Secondary | ICD-10-CM | POA: Diagnosis not present

## 2021-08-31 LAB — GLUCOSE, CAPILLARY
Glucose-Capillary: 104 mg/dL — ABNORMAL HIGH (ref 70–99)
Glucose-Capillary: 107 mg/dL — ABNORMAL HIGH (ref 70–99)
Glucose-Capillary: 111 mg/dL — ABNORMAL HIGH (ref 70–99)
Glucose-Capillary: 125 mg/dL — ABNORMAL HIGH (ref 70–99)
Glucose-Capillary: 83 mg/dL (ref 70–99)
Glucose-Capillary: 94 mg/dL (ref 70–99)

## 2021-08-31 NOTE — Progress Notes (Signed)
Physical Therapy Treatment ?Patient Details ?Name: Brian [name] ?MRN: [id number] ?DOB: [birth year] ?Today's Date: 08/31/2021 ? ? ?History of Present Illness Pt is a 61 y/o male admitted 06/29/21 following C3-7 ACDF after progressive cervical myelopathy symptoms including ataxia, bil UE and LE shaking and weakness, and loss of function in his hands with subsequent fall. Pt initially had improvement in neurological symptoms, but postoperative course was complicated by dysphasia and ataxia secondary to and epidural hematoma with evacuation of hematoma on 2/7. Course complicated by delirium secondary to encephalopathy, sepsis secondary to aspiration PNA, reintubated 2/19-2/26 for airway protection, bilat segmental PEs 2/18. PEG placed 3/6. PMHx includes tobacco and EtOH use disorder. ? ?  ?PT Comments  ? ? Focus of session today was bed mobility, functional transfer, standing balance, and repeat bouts of short distance ambulation. The patient tolerated well. He was able to perform bed mobility min A and STS mod A, while requiring max A for step pivot transfers. He still shows limited control of hip hip add/abd during transfers/gait and will scissor his legs without assist and cuing. He responded well to anterior priming before STS and was able to keep a more upright trunk during each bout of gait. He still requires mod A for trunk support at his hips during dynamic activity and his limited by functional  LE endurance. He was educated on LE to perform in his chair/bed to further progress his strength and LE control. Pt. Would benefit from skilled PT to continue to address his functional mobility, balance, and gait ability. Plan and discharge setting remains unchanged. Pt to follow acutely as appropriate.  ?   ?Recommendations for follow up therapy are one component of a multi-disciplinary discharge planning process, led by the attending physician.  Recommendations may be updated based on patient status, additional  functional criteria and insurance authorization. ? ?Follow Up Recommendations ? Skilled nursing-short term rehab (<3 hours/day) (potential AIR candidate if family can provide support) ?  ?  ?Assistance Recommended at Discharge Frequent or constant Supervision/Assistance  ?Patient can return home with the following Two people to help with walking and/or transfers;A lot of help with bathing/dressing/bathroom;Assistance with cooking/housework;Assistance with feeding;Direct supervision/assist for medications management;Direct supervision/assist for financial management;Assist for transportation;Help with stairs or ramp for entrance ?  ?Equipment Recommendations ? Wheelchair (measurements PT);Wheelchair cushion (measurements PT);Hospital bed  ?  ?Recommendations for Other Services Rehab consult ? ? ?  ?Precautions / Restrictions Precautions ?Precautions: Fall ?Restrictions ?Weight Bearing Restrictions: No  ?  ? ?Mobility ? Bed Mobility ?Overal bed mobility: Needs Assistance ?  ?  ?  ?Supine to sit: Min assist ?  ?  ?General bed mobility comments: Min A for trunk support and R LE scoot forward ?  ? ?Transfers ?Overall transfer level: Needs assistance ?Equipment used: 2 person hand held assist ?Transfers: Sit to/from Stand ?Sit to Stand: Mod assist ?Stand pivot transfers: Max assist ?  ?  ?  ?  ?General transfer comment: Mod A for STS, priming, sequencing cues, and assisted power up. Max A for step pivot for trunk support and LE coordination. ?  ? ?Ambulation/Gait ?  ?Gait Distance (Feet): 72 Feet (6x12 feet) ?Assistive device: 1 person hand held assist (1 HHA and 1 hand on rail) ?Gait Pattern/deviations: Decreased stride length, Step-to pattern, Ataxic, Decreased dorsiflexion - left, Decreased dorsiflexion - right, Scissoring, Knee flexed in stance - right, Knee flexed in stance - left, Trunk flexed ?Gait velocity: Decreased ?  ?  ?General Gait Details: Using railling one  one side and SPT on other providing trunk and hip  support. External tape cues to limit scissoring and verbal cues for R foot toward wall. Second SPT providing mod A for LE cuing and weight bearing accpetance. Cued for Chest up ? ? ?Stairs ?  ?  ?  ?  ?  ? ? ?Wheelchair Mobility ?  ? ?Modified Rankin (Stroke Patients Only) ?  ? ? ?  ?Balance Overall balance assessment: Needs assistance ?Sitting-balance support: No upper extremity supported ?Sitting balance-Leahy Scale: Fair ?Sitting balance - Comments: Able to maintain ?  ?Standing balance support: Bilateral upper extremity supported, During functional activity ?Standing balance-Leahy Scale: Poor ?Standing balance comment: Stood with UE support from hand rail and SPT as well as mod-max truncal control. 2nd person providing LE support during dynamic movements ?  ?  ?  ?  ?  ?  ?  ?  ?  ?  ?  ?  ? ?  ?Cognition Arousal/Alertness: Awake/alert ?Behavior During Therapy: [facility] for tasks assessed/performed ?Overall Cognitive Status: No family/caregiver present to determine baseline cognitive functioning ?Area of Impairment: Safety/judgement, Awareness, Problem solving ?  ?  ?  ?  ?  ?  ?  ?  ?  ?  ?  ?  ?Safety/Judgement: Decreased awareness of safety ?Awareness: Emergent ?Problem Solving: Requires verbal cues, Requires tactile cues ?General Comments: Pt. ready to work today and continue progression of walking ?  ?  ? ?  ?Exercises General Exercises - Lower Extremity ?Ankle Circles/Pumps: Strengthening, Both, 5 reps (educated on 20 reps) ?Quad Sets: AROM, Both, 5 reps (educated on 20 reps) ?Long Arc Quad: Strengthening, Both, 10 reps, Seated (Hip flexion + LAQ, educated on 20 reps) ?Hip ABduction/ADduction: AROM, Both, 10 reps (educated on 20 reps) ?Hip Flexion/Marching: Strengthening, Both, 10 reps (educated on 20 reps) ? ?  ?General Comments   ?  ?  ? ?Pertinent Vitals/Pain Pain Assessment ?Pain Assessment: No/denies pain  ? ? ?Home Living   ?  ?  ?  ?  ?  ?  ?  ?  ?  ?   ?  ?Prior Function    ?  ?  ?   ? ?PT Goals  (current goals can now be found in the care plan section) Acute Rehab PT Goals ?Patient Stated Goal: walk in 3 weeks ?PT Goal Formulation: With patient ?Time For Goal Achievement: 09/04/21 ?Potential to Achieve Goals: Fair ?Progress towards PT goals: Progressing toward goals ? ?  ?Frequency ? ? ? Min 5X/week ? ? ? ?  ?PT Plan Current plan remains appropriate  ? ? ?Co-evaluation   ?  ?  ?  ?  ? ?  ?AM-PAC PT "6 Clicks" Mobility   ?Outcome Measure ? Help needed turning from your back to your side while in a flat bed without using bedrails?: A Little ?Help needed moving from lying on your back to sitting on the side of a flat bed without using bedrails?: A Little ?Help needed moving to and from a bed to a chair (including a wheelchair)?: A Lot ?Help needed standing up from a chair using your arms (e.g., wheelchair or bedside chair)?: A Lot ?Help needed to walk in hospital room?: A Lot ?Help needed climbing 3-5 steps with a railing? : Total ?6 Click Score: 13 ? ?  ?End of Session Equipment Utilized During Treatment: Gait belt ?Activity Tolerance: Patient tolerated treatment well ?Patient left: with call bell/phone within reach;with chair alarm set;in chair ?Nurse Communication: Mobility status ?  PT Visit Diagnosis: Muscle weakness (generalized) (M62.81);Other symptoms and signs involving the nervous system (R29.898);Difficulty in walking, not elsewhere classified (R26.2);Other abnormalities of gait and mobility (R26.89) ?  ? ? ?Time: [id number] ?PT Time Calculation (min) (ACUTE ONLY): 34 min ? ?Charges:  $Gait Training: 8-22 mins ?$Neuromuscular Re-education: 8-22 mins          ?          ? ?Thermon Leyland, SPT ?Acute Rehab Services ? ? ? ?Thermon Leyland ?08/31/2021, 3:58 PM ? ?

## 2021-08-31 NOTE — Progress Notes (Signed)
? ?  Providing Compassionate, Quality Care - Together ? ? ?Subjective: ?Patient reports no issues overnight. ? ?Objective: ?Vital signs in last 24 hours: ?Temp:  [97.9 ?F (36.6 ?C)-98.6 ?F (37 ?C)] 98.6 ?F (37 ?C) (04/07 [id number]) ?Pulse Rate:  [79-88] 79 (04/07 0956) ?Resp:  [15-18] 15 (04/07 0956) ?BP: (87-112)/(62-79) 97/69 (04/07 [id number]) ?SpO2:  [100 %] 100 % (04/07 0956) ? ?Intake/Output from previous day: ?04/06 0701 - 04/07 0700 ?In: 9158.8 [NG/GT:9158.8] ?Out: 850 [Urine:850] ?Intake/Output this shift: ?No intake/output data recorded. ? ?Alert; oriented to person, place, and time ?MAE, Generalized weakness secondary to deconditioning ?Decreased fine motor BUE ?Incision is healing well ? ?Lab Results: ?Recent Labs  ?  08/29/21 ?0421  ?WBC 6.4  ?HGB 9.0*  ?HCT 27.6*  ?PLT 516*  ? ?BMET ?Recent Labs  ?  08/29/21 ?0421  ?NA 132*  ?K 4.3  ?CL 96*  ?CO2 30  ?GLUCOSE 100*  ?BUN 30*  ?CREATININE 0.67  ?CALCIUM 10.3  ? ? ?Studies/Results: ?No results found. ? ?Assessment/Plan: ?Patient status post C3-4, C4-5, C5-6, C6-7 anterior cervical discectomy with interbody fusion by Dr. Annette Stable on 06/29/2021. Increased difficulty swallowing with lung atelectasis and elevated temperatures on 07/02/2021. Made NPO following MBS by SLP. 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 CTA head and neck negative for stroke. MRI revealed epidural hematoma. Patient underwent exploration of his cervical fusion with evacuation of the epidural hematoma on 07/03/2021. Cortrak was placed on 07/04/2021. Patient's strength much improved since epidural hematoma evacuation. Cortrak removed 07/06/2021 and dysphagia 2 diet with nectar thick liquids started. Patient with delirium vs ETOH withdrawal. Discontinued steroids 07/06/2021. CIWA protocol started and discontinued 07/07/2021. CT and MRI 07/07/2021 negative. Patient developed respiratory distress, requiring intubation on 07/15/2021. Work up revealed aspiration PNA and  PE. Patient was extubated on 07/22/2021. Foley catheter removed 07/24/2021. Patient transferred to 3W on 07/26/2021. Foley replaced 07/28/2021. PEG placed 07/30/2021. Foley removed 08/09/2021. PEG pulled out by patient 08/16/2021. Delirium worsening 08/17/2021. Haldol administered. IR replaced PEG on 08/17/2021. Patient with urosepsis 08/18/2021. Culture grew E. Coli. C/o LBP 08/28/2021. Lumbar MRI demonstrated chronic degenerative changes with spinal stenosis at L3-4, L4-5, and L5-S1. Patient suffered a fall overnight 08/29/2021 without injury. ? ? LOS: 63 days  ? ?-Continue efforts at rehabilitation. ?-Goal is for SNF placement. ? ? ?Viona Gilmore, DNP, AGNP-C ?Nurse Practitioner ? ?[facility] ?[address], [place], [place] [address] ?[id number]    [id number] ? ?08/31/2021, 10:25 AM ? ? ? ? ?

## 2021-08-31 NOTE — Progress Notes (Signed)
?PROGRESS NOTE ? ? ? ?Brian [name]  [id number] DOB: [birth year] DOA: 06/29/2021 ?PCP: Pcp, No  ? 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  He was admitted on 2/3 and underwent ACDF of C3-4, C4-5, C5-5, and C6-7.  Post-operatively he was progressing slowly with residual weakness in both hands but improving lower extremity strength and function.  He developed some difficulty swallowing on 2/5 and SLP ordered and placed on thickened liquids.   Overnight 2/6, patient with increased difficulty swallowing and was made NPO but also noted to have dysarthria and difficulty moving extremities with numbness.  SLP evaluated and found to be moderate aspiration risk.   Also progressively tachycardic, tachypneic, and now febrile.  Code stroke activated and taken for CT/ CTA head and neck and was given decadron 10mg  once.  NIHSS 18.  CTH was negative for acute findings, and CTA head neck did not reveal any LVO but noted significant prevertebral soft tissue swelling with foci of gas present at the ventral epidural space at C4-5, small collection not excluded. On 2/7, patient with worsening confusion and concern for airway involvement, PCCM consulted and remained in the intensive care unit until he is transferred to the floor on 3/2 and PCCM requested transition to [facility] for medical assistance while patient remains under the neurosurgery service. ? ?Significant Hospital Events: ?2/3 ACDF C3-4, C4-5, C5-5, and C6-7 w/ Dr. Annette Stable ?2/6 SLP eval for difficulty swallowing ?2/7 Code stroke overnight, neg for LVO, CT showing soft tissue swelling.  PCCM consulted for concern of airway management  and AMS.  Went for evacuation of hematoma  ?2/18 CT Abd/Pelvis: showing bilateral segmental Pes, started on heparin and showing worsening pneumonia, febrile to  103.  PCT rising, 34.6, restarted on abx ?2/19 possible aspiration w/ severe hypoxia; intubated; Switching from heparin to angiomax given subtherapeutic levels despite increase in rate. ?2/20 Echo shows normal LV systolic function. There was notation of a + McConnell's sign c/w a large pulmonary embolus which is consistent with the finding of bilateral segmental pulmonary emboli noted on CT 07/14/2021. ?2/21 rash appreciated on back; stopped cefepim/vanc switched to zosyn; required low dose levo with increase in fentanyl ?2/22 remains intubated; unable to extubate to do increase RR and thick secretions ?2/23 Ketamine infusion added ?2/24: weaning fentanyl.  ?2/26 extubated ?2/28 tachycardia persists ?3/2: Modified barium swallow, continues n.p.o. with core track in place, likely will need PEG tube. ?3/6: s/p IR G-tube placement ?3/16: Foley catheter discontinued ?3/24: E. coli UTI/bacteremia>> cefazolin x7 days per ID ?Now medically clear awaiting SNF placement still but per TOC has been difficult to place. ?4/5-4/6 Had a fall overnight and fell out of the bed and was unwitnessed but patient states he rolled out of the bed and did not get hurt. Has fall precautions now. ?  ?Assessment and Plan: ?* Cervical myelopathy ([name]); severe cervical spinal stenosis with cord compression s/p ACDF [id number] on 2/3, complicated by epidural hematoma s/p evacuation 2/7 ?Patient initially presented s/p recent fall with progressive upper and lower extremity weakness and tremors, He was found to have critical multilevel cervical spinal stenosis @ C3-6 with spinal cord compression and spinal cord signal change and underwent ACDF C3-4, C4-5,  C5-5, and C6/7 by neurosurgery Dr. Dutch Quint on 06/29/2021.  Postoperative leg complicated by epidural hematoma s/p evacuation on 2/7. ?--Continue  PT/OT/SLP efforts, Awaiting SNF ?--Further as per neurosurgery recommendation. ? ?Acute respiratory failure with hypoxia (HCC) ?-Likely Multifactorial with significant dysphagia and recurrent aspiration pneumonia events during initial hospitalization following ACDF surgery with postoperative complications of postoperative cervical hematoma.   ?-Patient did require ventilatory support in the intensive care unit and was successfully extubated on 07/22/2021.   ?-Patient completed extensive course of empiric antibiotics.  Oxygen now weaned off, Now on room air. ?-SpO2: 100 % ?O2 Flow Rate (L/min): 0 L/min ?FiO2 (%): 40 %  ?-Continue Aspiration precautions ? ? ? ? ?Acute pulmonary embolism (HCC) ?-CTA chest showed bilateral segmental PE.   ?-LE [place] negative for DVT.  TTE with normal LV systolic function.  Supplemental oxygen weaned off. ?-C/w Eliquis 5mg  BID ? ?Acute metabolic encephalopathy ?-CT head, MRI brain, EEG, TSH, B12, ammonia unrevealing.  ?-Etiology likely multifactorial with acute respiratory failure secondary to aspiration pneumonia, bilateral pulmonary embolism.  -Completed course of antibiotics for aspiration pneumonia.  ?-Seroquel and clonazepam discontinued.  -Completed course of antibiotics for E. coli UTI/septicemia ? ?E. coli bacteremia ?-Urine culture and blood cultures x2 08/17/2028 positive for E. coli.   ?-Evaluated by infectious disease.   ?-Completed 7-day course of antibiotics per ID. ? ?Severe sepsis due to recurrent aspiration pneumonia ?-Likely recurrent issue during hospitalization, remains n.p.o. due to continued aspiration risk per SLP.   ?-Completed extensive course of antibiotics while under ICU care.  ?-S/p IR gtube 3/6. ?-Continue tube feeds nocturnally  ?-Cleared for dysphagia 3 mechanical soft diet with thin liquids per SLP; but with very little oral intake per RN; and patient with little desire for oral intake ?-Continue aspiration precautions ?-Continue SLP efforts while  inpatient ? ?Sinus tachycardia ?-Multifactorial including sepsis, dehydration, PE, agitation.   ?-Continue Metoprolol 100 mg twice daily ?-No longer on Telemetry ? ? ? ? ?Elevated liver enzymes ?-Etiology likely secondary to sepsis from aspiration pneumonia as above.  Acute hepatitis panel negative.  RUQ ultrasound unremarkable. ?--Continue monitor LFTs intermittently but they have resolved and AST was 31 and ALT was 28 on last check  ? ?Hyponatremia ?-Etiology likely secondary to dehydration.  Urine sodium low.  Continue tube feeds; now cleared by speech therapy for dysphagia 3 diet. ?-Na+  Went from 136 -> 130 -> 132 on last check  ?-Continue to Monitor and Trend intermittently  ? ?Tobacco use disorder ?-Encouraged cessation. ?-Nicotine patch; decreased to 7 mg on 3/15 ? ?Physical deconditioning ?-Significant weakness in all extremities as a result of cervical myelopathy and acute illness.  Slowly improving while inpatient. ?-Continue PT/OT efforts, will need SNF placement; difficult placement per TOC  ? ?Urinary retention ?Foley catheter discontinued on 08/09/2021 with no current signs of urinary retention. ?--Continue bethanechol ?--Continue bladder scan as needed ?--Continue monitor urinary output and strict I's and O's ?-Patient is +7.762 Liters since admission ?-Foley has been removed and now has a condom cath ? ?Protein-calorie malnutrition, severe (HCC) ?As evidenced by poor p.o. intake, significant muscle mass and subcu fat loss and significant weight loss (about 16 pounds since this hospitalization). ?Nutrition Problem: Severe Malnutrition (in the context of social/environmental circumstances) ?Etiology:  (inadequate energy intake) ?Signs/Symptoms: mild fat depletion, severe muscle depletion, severe muscle depletion ?Interventions: Refer to RD note for recommendations  ?--Dietitian following, appreciate assistance.   ?--s/p IR gtube 3/6.  ?--Now cleared for dysphagia 3 diet ?--Continue tube  feeds ?--continue  SLP efforts ? ?Fall during current hospitalization ?-Larey Seat out of bed  ?-Did not lose consciousness and it was unwitnessed but denies any head trauma and only nursing assessment did not exhibit any focal deficits

## 2021-09-01 DIAGNOSIS — R7881 Bacteremia: Secondary | ICD-10-CM | POA: Diagnosis not present

## 2021-09-01 DIAGNOSIS — G959 Disease of spinal cord, unspecified: Secondary | ICD-10-CM | POA: Diagnosis not present

## 2021-09-01 DIAGNOSIS — G9341 Metabolic encephalopathy: Secondary | ICD-10-CM | POA: Diagnosis not present

## 2021-09-01 DIAGNOSIS — R748 Abnormal levels of other serum enzymes: Secondary | ICD-10-CM | POA: Diagnosis not present

## 2021-09-01 LAB — GLUCOSE, CAPILLARY
Glucose-Capillary: 118 mg/dL — ABNORMAL HIGH (ref 70–99)
Glucose-Capillary: 120 mg/dL — ABNORMAL HIGH (ref 70–99)
Glucose-Capillary: 123 mg/dL — ABNORMAL HIGH (ref 70–99)
Glucose-Capillary: 125 mg/dL — ABNORMAL HIGH (ref 70–99)
Glucose-Capillary: 128 mg/dL — ABNORMAL HIGH (ref 70–99)
Glucose-Capillary: 146 mg/dL — ABNORMAL HIGH (ref 70–99)
Glucose-Capillary: 99 mg/dL (ref 70–99)

## 2021-09-01 NOTE — Progress Notes (Signed)
Patient ID: Brian [name], male   DOB: 1960-11-12, 61 y.o.   MRN: [id number] ?BP 104/78 (BP Location: Left Arm)   Pulse 87   Temp 98.5 ?F (36.9 ?C) (Oral)   Resp 16   Ht 5\' 10"  (1.778 m)   Wt 52.2 kg   SpO2 100%   BMI 16.51 kg/m?  ?Alert and oriented x4 ?Moving all extremities, very weak in hands, grips, intrinsic musculature. Certainly has improved, but remains with motor impairments.  ?Sensation markedly impaired in hands bilaterally, very poor fine motor movement  ?

## 2021-09-01 NOTE — Progress Notes (Signed)
?PROGRESS NOTE ? ? ? ?Brian [name]  [id number] DOB: 27-Feb-1961 DOA: 06/29/2021 ?PCP: Pcp, No  ? 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  He was admitted on 2/3 and underwent ACDF of C3-4, C4-5, C5-5, and C6-7.  Post-operatively he was progressing slowly with residual weakness in both hands but improving lower extremity strength and function.  He developed some difficulty swallowing on 2/5 and SLP ordered and placed on thickened liquids.   Overnight 2/6, patient with increased difficulty swallowing and was made NPO but also noted to have dysarthria and difficulty moving extremities with numbness.  SLP evaluated and found to be moderate aspiration risk.   Also progressively tachycardic, tachypneic, and now febrile.  Code stroke activated and taken for CT/ CTA head and neck and was given decadron 10mg  once.  NIHSS 18.  CTH was negative for acute findings, and CTA head neck did not reveal any LVO but noted significant prevertebral soft tissue swelling with foci of gas present at the ventral epidural space at C4-5, small collection not excluded. On 2/7, patient with worsening confusion and concern for airway involvement, PCCM consulted and remained in the intensive care unit until he is transferred to the floor on 3/2 and PCCM requested transition to [facility] for medical assistance while patient remains under the neurosurgery service. ? ?Significant Hospital Events: ?2/3 ACDF C3-4, C4-5, C5-5, and C6-7 w/ Dr. Annette Stable ?2/6 SLP eval for difficulty swallowing ?2/7 Code stroke overnight, neg for LVO, CT showing soft tissue swelling.  PCCM consulted for concern of airway management  and AMS.  Went for evacuation of hematoma  ?2/18 CT Abd/Pelvis: showing bilateral segmental Pes, started on heparin and showing worsening pneumonia, febrile to  103.  PCT rising, 34.6, restarted on abx ?2/19 possible aspiration w/ severe hypoxia; intubated; Switching from heparin to angiomax given subtherapeutic levels despite increase in rate. ?2/20 Echo shows normal LV systolic function. There was notation of a + McConnell's sign c/w a large pulmonary embolus which is consistent with the finding of bilateral segmental pulmonary emboli noted on CT 07/14/2021. ?2/21 rash appreciated on back; stopped cefepim/vanc switched to zosyn; required low dose levo with increase in fentanyl ?2/22 remains intubated; unable to extubate to do increase RR and thick secretions ?2/23 Ketamine infusion added ?2/24: weaning fentanyl.  ?2/26 extubated ?2/28 tachycardia persists ?3/2: Modified barium swallow, continues n.p.o. with core track in place, likely will need PEG tube. ?3/6: s/p IR G-tube placement ?3/16: Foley catheter discontinued ?3/24: E. coli UTI/bacteremia>> cefazolin x7 days per ID ?Now medically clear awaiting SNF placement still but per TOC has been difficult to place. ?4/5-4/6 Had a fall overnight and fell out of the bed and was unwitnessed but patient states he rolled out of the bed and did not get hurt. Has fall precautions now. ?  ? ? ?Assessment and Plan: ?* Cervical myelopathy ([place]); severe cervical spinal stenosis with cord compression s/p ACDF [id number] on 2/3, complicated by epidural hematoma s/p evacuation 2/7 ?Patient initially presented s/p recent fall with progressive upper and lower extremity weakness and tremors, He was found to have critical multilevel cervical spinal stenosis @ C3-6 with spinal cord compression and spinal cord signal change and underwent ACDF  C3-4, C4-5, C5-5, and C6/7 by neurosurgery Dr. Trenton Gammon on 06/29/2021.  Postoperative leg complicated by epidural hematoma s/p evacuation on 2/7. ?-Continue  PT/OT/SLP efforts, Awaiting SNF ?-Further as per neurosurgery recommendation. ? ?Acute respiratory failure with hypoxia ([place]) ?-Likely Multifactorial with significant dysphagia and recurrent aspiration pneumonia events during initial hospitalization following ACDF surgery with postoperative complications of postoperative cervical hematoma.   ?-Patient did require ventilatory support in the intensive care unit and was successfully extubated on 07/22/2021.   ?-Patient completed extensive course of empiric antibiotics.  Oxygen now weaned off, Now on room air. ?-SpO2: 100 % ?O2 Flow Rate (L/min): 0 L/min ?FiO2 (%): 40 %  ?-Continue Aspiration precautions ? ? ? ? ?Acute pulmonary embolism ([place]) ?-CTA chest showed bilateral segmental PE.   ?-LE [place] negative for DVT.  TTE with normal LV systolic function.  Supplemental oxygen weaned off. ?-C/w Eliquis 5mg  BID ? ?Acute metabolic encephalopathy ?-CT head, MRI brain, EEG, TSH, B12, ammonia unrevealing.  ?-Etiology likely multifactorial with acute respiratory failure secondary to aspiration pneumonia, bilateral pulmonary embolism.  -Completed course of antibiotics for aspiration pneumonia.  ?-Seroquel and clonazepam discontinued.  -Completed course of antibiotics for E. coli UTI/septicemia ? ?E. coli bacteremia ?-Urine culture and blood cultures x2 08/17/2028 positive for E. coli.   ?-Evaluated by infectious disease.   ?-Completed 7-day course of antibiotics per ID. ? ?Severe sepsis due to recurrent aspiration pneumonia ?-Likely recurrent issue during hospitalization, remains n.p.o. due to continued aspiration risk per SLP.   ?-Completed extensive course of antibiotics while under ICU care.  ?-S/p IR gtube 3/6. ?-Continue tube feeds nocturnally  ?-Cleared for dysphagia 3 mechanical soft diet with thin liquids per SLP; but with very little oral intake per RN; and patient with little desire for oral intake ?-Continue aspiration precautions ?-Continue SLP efforts while  inpatient ? ?Sinus tachycardia ?-Multifactorial including sepsis, dehydration, PE, agitation.   ?-Continue Metoprolol 100 mg twice daily ?-No longer on Telemetry ? ? ? ? ?Elevated liver enzymes ?-Etiology likely secondary to sepsis from aspiration pneumonia as above.  Acute hepatitis panel negative.  RUQ ultrasound unremarkable. ?--Continue monitor LFTs intermittently but they have resolved and AST was 31 and ALT was 28 on last check  ? ?Hyponatremia ?-Etiology likely secondary to dehydration.  Urine sodium low.  Continue tube feeds; now cleared by speech therapy for dysphagia 3 diet. ?-Na+  Went from 136 -> 130 -> 132 on last check  ?-Continue to Monitor and Trend intermittently  ? ?Tobacco use disorder ?-Encouraged cessation. ?-Nicotine patch; decreased to 7 mg on 3/15 ? ?Physical deconditioning ?-Significant weakness in all extremities as a result of cervical myelopathy and acute illness.  Slowly improving while inpatient. ?-Continue PT/OT efforts, will need SNF placement; difficult placement per TOC  ? ?Urinary retention ?Foley catheter discontinued on 08/09/2021 with no current signs of urinary retention. ?--Continue bethanechol ?--Continue bladder scan as needed ?--Continue monitor urinary output and strict I's and O's ?-Patient is +5.676 Liters since admission ?-Foley has been removed and now has a condom cath ? ?Protein-calorie malnutrition, severe ([place]) ?As evidenced by poor p.o. intake, significant muscle mass and subcu fat loss and significant weight loss (about 16 pounds since this hospitalization). ?Nutrition Problem: Severe Malnutrition (in the context of social/environmental circumstances) ?Etiology:  (inadequate energy intake) ?Signs/Symptoms: mild fat depletion, severe muscle depletion, severe muscle depletion ?Interventions: Refer to RD note for recommendations  ?--Dietitian following, appreciate assistance.   ?--s/p IR gtube 3/6.  ?--Now cleared for dysphagia 3 diet ?--Continue tube  feeds ?--continue SLP efforts ? ?Fall during current hospitalization ?-[address] out of bed  ?-Did not lose consciousness and it was unwitnessed but denies any head trauma and only nursing assessment did not exhibit any focal defici

## 2021-09-02 DIAGNOSIS — G959 Disease of spinal cord, unspecified: Secondary | ICD-10-CM | POA: Diagnosis not present

## 2021-09-02 DIAGNOSIS — R748 Abnormal levels of other serum enzymes: Secondary | ICD-10-CM | POA: Diagnosis not present

## 2021-09-02 DIAGNOSIS — R7881 Bacteremia: Secondary | ICD-10-CM | POA: Diagnosis not present

## 2021-09-02 DIAGNOSIS — G9341 Metabolic encephalopathy: Secondary | ICD-10-CM | POA: Diagnosis not present

## 2021-09-02 LAB — GLUCOSE, CAPILLARY
Glucose-Capillary: 98 mg/dL (ref 70–99)
Glucose-Capillary: 127 mg/dL — ABNORMAL HIGH (ref 70–99)
Glucose-Capillary: 111 mg/dL — ABNORMAL HIGH (ref 70–99)
Glucose-Capillary: 103 mg/dL — ABNORMAL HIGH (ref 70–99)
Glucose-Capillary: 123 mg/dL — ABNORMAL HIGH (ref 70–99)

## 2021-09-02 NOTE — Progress Notes (Signed)
Patient ID: Brian [name], male   DOB: 04-11-1961, 61 y.o.   MRN: [id number] ?BP 100/75 (BP Location: Left Arm)   Pulse 84   Temp 97.6 ?F (36.4 ?C) (Oral)   Resp 16   Ht 5\' 10"  (1.778 m)   Wt 52.2 kg   SpO2 100%   BMI 16.51 kg/m?  ?Alert oriented x 4 speech is clear and fluent ?No changes neurologically ?Continue with PT, Ot ?

## 2021-09-02 NOTE — Progress Notes (Signed)
?PROGRESS NOTE ? ? ? ?Brian [name]  [id number] DOB: [birth year] DOA: 06/29/2021 ?PCP: Pcp, No  ? 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  He was admitted on 2/3 and underwent ACDF of C3-4, C4-5, C5-5, and C6-7.  Post-operatively he was progressing slowly with residual weakness in both hands but improving lower extremity strength and function.  He developed some difficulty swallowing on 2/5 and SLP ordered and placed on thickened liquids.   Overnight 2/6, patient with increased difficulty swallowing and was made NPO but also noted to have dysarthria and difficulty moving extremities with numbness.  SLP evaluated and found to be moderate aspiration risk.   Also progressively tachycardic, tachypneic, and now febrile.  Code stroke activated and taken for CT/ CTA head and neck and was given decadron 10mg  once.  NIHSS 18.  CTH was negative for acute findings, and CTA head neck did not reveal any LVO but noted significant prevertebral soft tissue swelling with foci of gas present at the ventral epidural space at C4-5, small collection not excluded. On 2/7, patient with worsening confusion and concern for airway involvement, PCCM consulted and remained in the intensive care unit until he is transferred to the floor on 3/2 and PCCM requested transition to [facility] for medical assistance while patient remains under the neurosurgery service. ? ?Significant Hospital Events: ?2/3 ACDF C3-4, C4-5, C5-5, and C6-7 w/ Dr. Annette Stable ?2/6 SLP eval for difficulty swallowing ?2/7 Code stroke overnight, neg for LVO, CT showing soft tissue swelling.  PCCM consulted for concern of airway management  and AMS.  Went for evacuation of hematoma  ?2/18 CT Abd/Pelvis: showing bilateral segmental Pes, started on heparin and showing worsening pneumonia, febrile to  103.  PCT rising, 34.6, restarted on abx ?2/19 possible aspiration w/ severe hypoxia; intubated; Switching from heparin to angiomax given subtherapeutic levels despite increase in rate. ?2/20 Echo shows normal LV systolic function. There was notation of a + McConnell's sign c/w a large pulmonary embolus which is consistent with the finding of bilateral segmental pulmonary emboli noted on CT 07/14/2021. ?2/21 rash appreciated on back; stopped cefepim/vanc switched to zosyn; required low dose levo with increase in fentanyl ?2/22 remains intubated; unable to extubate to do increase RR and thick secretions ?2/23 Ketamine infusion added ?2/24: weaning fentanyl.  ?2/26 extubated ?2/28 tachycardia persists ?3/2: Modified barium swallow, continues n.p.o. with core track in place, likely will need PEG tube. ?3/6: s/p IR G-tube placement ?3/16: Foley catheter discontinued ?3/24: E. coli UTI/bacteremia>> cefazolin x7 days per ID ?Now medically clear awaiting SNF placement still but per TOC has been difficult to place. ?4/5-4/6 Had a fall overnight and fell out of the bed and was unwitnessed but patient states he rolled out of the bed and did not get hurt. Has fall precautions now. ?  ?Assessment and Plan: ?* Cervical myelopathy ([name]); severe cervical spinal stenosis with cord compression s/p ACDF [id number] on 2/3, complicated by epidural hematoma s/p evacuation 2/7 ?Patient initially presented s/p recent fall with progressive upper and lower extremity weakness and tremors, He was found to have critical multilevel cervical spinal stenosis @ C3-6 with spinal cord compression and spinal cord signal change and underwent ACDF C3-4, C4-5,  C5-5, and C6/7 by neurosurgery Dr. Trenton Gammon on 06/29/2021.  Postoperative leg complicated by epidural hematoma s/p evacuation on 2/7. ?-Continue  PT/OT/SLP efforts, Awaiting SNF still ?-Further as per neurosurgery recommendation. ? ?Acute respiratory failure with hypoxia ([place]) ?-Likely Multifactorial with significant dysphagia and recurrent aspiration pneumonia events during initial hospitalization following ACDF surgery with postoperative complications of postoperative cervical hematoma.   ?-Patient did require ventilatory support in the intensive care unit and was successfully extubated on 07/22/2021.   ?-Patient completed extensive course of empiric antibiotics.  Oxygen now weaned off, Now on room air. ?-SpO2: 100 % ?O2 Flow Rate (L/min): 0 L/min ?FiO2 (%): 40 %  ?-Continue Aspiration precautions ? ? ? ? ?Acute pulmonary embolism ([place]) ?-CTA chest showed bilateral segmental PE.   ?-LE [place] negative for DVT.  TTE with normal LV systolic function.  Supplemental oxygen weaned off. ?-C/w Eliquis 5mg  BID ? ?Acute metabolic encephalopathy ?-CT head, MRI brain, EEG, TSH, B12, ammonia unrevealing.  ?-Etiology likely multifactorial with acute respiratory failure secondary to aspiration pneumonia, bilateral pulmonary embolism.  -Completed course of antibiotics for aspiration pneumonia.  ?-Seroquel and clonazepam discontinued.  -Completed course of antibiotics for E. coli UTI/septicemia ? ?E. coli bacteremia ?-Urine culture and blood cultures x2 08/17/2028 positive for E. coli.   ?-Evaluated by infectious disease.   ?-Completed 7-day course of antibiotics per ID. ? ?Severe sepsis due to recurrent aspiration pneumonia ?-Likely recurrent issue during hospitalization, remains n.p.o. due to continued aspiration risk per SLP.   ?-Completed extensive course of antibiotics while under ICU care.  ?-S/p IR gtube 3/6. ?-Continue tube feeds nocturnally  ?-Cleared for dysphagia 3 mechanical soft diet with thin liquids per SLP; but with very little oral intake per RN; and patient with little desire for oral intake ?-Continue aspiration precautions ?-Continue SLP efforts while  inpatient ? ?Sinus tachycardia ?-Multifactorial including sepsis, dehydration, PE, agitation.   ?-Continue Metoprolol 100 mg twice daily ?-No longer on Telemetry ? ? ? ? ?Elevated liver enzymes ?-Etiology likely secondary to sepsis from aspiration pneumonia as above.  Acute hepatitis panel negative.  RUQ ultrasound unremarkable. ?--Continue monitor LFTs intermittently but they have resolved and AST was 31 and ALT was 28 on last check  ? ?Hyponatremia ?-Etiology likely secondary to dehydration.  Urine sodium low.  Continue tube feeds; now cleared by speech therapy for dysphagia 3 diet. ?-Na+  Went from 136 -> 130 -> 132 on last check  ?-Continue to Monitor and Trend intermittently  ? ?Tobacco use disorder ?-Encouraged cessation. ?-Nicotine patch; decreased to 7 mg on 3/15 ? ?Physical deconditioning ?-Significant weakness in all extremities as a result of cervical myelopathy and acute illness.  Slowly improving while inpatient. ?-Continue PT/OT efforts, will need SNF placement; difficult placement per TOC  ? ?Urinary retention ?Foley catheter discontinued on 08/09/2021 with no current signs of urinary retention. ?--Continue bethanechol ?--Continue bladder scan as needed ?--Continue monitor urinary output and strict I's and O's ?-Patient is +4.054 Liters since admission ?-Foley has been removed and now has a condom cath ? ?Protein-calorie malnutrition, severe ([place]) ?As evidenced by poor p.o. intake, significant muscle mass and subcu fat loss and significant weight loss (about 16 pounds since this hospitalization). ?Nutrition Problem: Severe Malnutrition (in the context of social/environmental circumstances) ?Etiology:  (inadequate energy intake) ?Signs/Symptoms: mild fat depletion, severe muscle depletion, severe muscle depletion ?Interventions: Refer to RD note for recommendations  ?--Dietitian following, appreciate assistance.   ?--s/p IR gtube 3/6.  ?--Now cleared for dysphagia 3 diet ?--Continue tube  feeds ?--  continue SLP efforts ? ?Fall during current hospitalization ?-[address] out of bed  ?-Did not lose consciousness and it was unwitnessed but denies any head trauma and only nursing assessment did not exhibit any focal defi

## 2021-09-03 DIAGNOSIS — G9341 Metabolic encephalopathy: Secondary | ICD-10-CM | POA: Diagnosis not present

## 2021-09-03 DIAGNOSIS — R748 Abnormal levels of other serum enzymes: Secondary | ICD-10-CM | POA: Diagnosis not present

## 2021-09-03 DIAGNOSIS — G959 Disease of spinal cord, unspecified: Secondary | ICD-10-CM | POA: Diagnosis not present

## 2021-09-03 DIAGNOSIS — R7881 Bacteremia: Secondary | ICD-10-CM | POA: Diagnosis not present

## 2021-09-03 LAB — GLUCOSE, CAPILLARY
Glucose-Capillary: 93 mg/dL (ref 70–99)
Glucose-Capillary: 94 mg/dL (ref 70–99)
Glucose-Capillary: 111 mg/dL — ABNORMAL HIGH (ref 70–99)
Glucose-Capillary: 91 mg/dL (ref 70–99)
Glucose-Capillary: 99 mg/dL (ref 70–99)
Glucose-Capillary: 101 mg/dL — ABNORMAL HIGH (ref 70–99)

## 2021-09-03 MED ORDER — OSMOLITE 1.5 CAL PO LIQD
1360.0000 mL | ORAL | Status: DC
  Administered 2021-09-03 – 2021-09-12 (×10): 1360 mL
  Administered 2021-09-14: 1000 mL
  Administered 2021-09-15: 1360 mL
  Administered 2021-09-15 – 2021-09-16 (×2): 1000 mL
  Administered 2021-09-17 – 2021-09-18 (×3): 1360 mL
  Administered 2021-09-19: 1000 mL
  Administered 2021-09-20: 1360 mL
  Filled 2021-09-03 (×7): qty 2000

## 2021-09-03 MED ORDER — FREE WATER
175.0000 mL | Status: DC
  Administered 2021-09-03 – 2021-09-14 (×56): 175 mL

## 2021-09-03 MED ORDER — PROSOURCE TF PO LIQD
45.0000 mL | Freq: Two times a day (BID) | ORAL | Status: DC
  Administered 2021-09-04 – 2021-09-21 (×35): 45 mL
  Filled 2021-09-03 (×37): qty 45

## 2021-09-03 NOTE — Progress Notes (Signed)
Patient stable feels significantly improved wants to go home ? ?4 to 4+ out of 5 upper and lower extremity significantly improved ? ?Continue to work on physical outpatient therapy rehab versus home health ?

## 2021-09-03 NOTE — Progress Notes (Signed)
Physical Therapy Treatment ?Patient Details ?Name: Brian [name] ?MRN: [id number] ?DOB: 12-23-1960 ?Today's Date: 09/03/2021 ? ? ?History of Present Illness Pt is a 61 y/o male admitted 06/29/21 following C3-7 ACDF after progressive cervical myelopathy symptoms including ataxia, bil UE and LE shaking and weakness, and loss of function in his hands with subsequent fall. Pt initially had improvement in neurological symptoms, but postoperative course was complicated by dysphasia and ataxia secondary to and epidural hematoma with evacuation of hematoma on 2/7. Course complicated by delirium secondary to encephalopathy, sepsis secondary to aspiration PNA, reintubated 2/19-2/26 for airway protection, bilat segmental PEs 2/18. PEG placed 3/6. PMHx includes tobacco and EtOH use disorder. ? ?  ?PT Comments  ? ? Pt. Received EOB and ready to work on walking. Continued standing balance and stepping progression today while also including functional strengthening. Pt. Was able to go supine to sitting EOB min guard  with the HOB elevated. He shows continued progression with his transfer ability requiring mod A +2 for multiple step pivot transfers. He responded well to the functional strengthening with repeated STS practice and lateral stepping with B UE support on a rail. He continues to require mod A for truncal support during all dynamic activities, he is limited currently by dynamic balance, LE strength, and coordination. Overall gait he requires max +1 and significant cuing for LE placement with a second person for chair follow, he had 2 episodes of LE cramping where he needed to sit immediately. Plan and discharge setting remain unchanged. PT to follow up acutely as able.  ?  ?Recommendations for follow up therapy are one component of a multi-disciplinary discharge planning process, led by the attending physician.  Recommendations may be updated based on patient status, additional functional criteria and insurance  authorization. ? ?Follow Up Recommendations ? Skilled nursing-short term rehab (<3 hours/day) ?  ?  ?Assistance Recommended at Discharge Frequent or constant Supervision/Assistance  ?Patient can return home with the following Two people to help with walking and/or transfers;A lot of help with bathing/dressing/bathroom;Assistance with cooking/housework;Assistance with feeding;Direct supervision/assist for medications management;Direct supervision/assist for financial management;Assist for transportation;Help with stairs or ramp for entrance ?  ?Equipment Recommendations ? Wheelchair (measurements PT);Wheelchair cushion (measurements PT);Hospital bed  ?  ?Recommendations for Other Services   ? ? ?  ?Precautions / Restrictions Precautions ?Precautions: Fall ?Restrictions ?Weight Bearing Restrictions: No  ?  ? ?Mobility ? Bed Mobility ?Overal bed mobility: Needs Assistance ?Bed Mobility: Supine to Sit ?  ?  ?Supine to sit: Min guard, HOB elevated ?  ?  ?General bed mobility comments: min guard for saftey to sit EOB, increased time ?  ? ?Transfers ?Overall transfer level: Needs assistance ?Equipment used: 1 person hand held assist ?Transfers: Sit to/from Stand ?Sit to Stand: Mod assist ?  ?Step pivot transfers: Mod assist, +2 physical assistance ?  ?  ?  ?General transfer comment: STS x10 ?  ? ?Ambulation/Gait ?Ambulation/Gait assistance: Max assist, +2 safety/equipment ?Gait Distance (Feet): 30 Feet ?Assistive device: 1 person hand held assist (and rail on the L) ?Gait Pattern/deviations: Decreased stride length, Step-to pattern, Ataxic, Decreased dorsiflexion - left, Decreased dorsiflexion - right, Scissoring, Knee flexed in stance - right, Knee flexed in stance - left, Trunk flexed ?Gait velocity: Decreased ?  ?Pre-gait activities: Lateral stepping 5 x 2 steps R/2steps L with B UE on railling ?General Gait Details: Using rail on 1 side and SPT on other providing truncal support and cuing for foot placement. Cues for R  foot  towards SPT and L foot towards wall ? ? ?Stairs ?  ?  ?  ?  ?  ? ? ?Wheelchair Mobility ?  ? ?Modified Rankin (Stroke Patients Only) ?  ? ? ?  ?Balance Overall balance assessment: Needs assistance ?Sitting-balance support: No upper extremity supported ?Sitting balance-Leahy Scale: Fair ?  ?Postural control: Posterior lean ?Standing balance support: Bilateral upper extremity supported, During functional activity ?Standing balance-Leahy Scale: Poor ?Standing balance comment: stood with B UE on railing, or 1 UE on railing while 1 UE HHA ?  ?  ?  ?  ?  ?  ?High level balance activites: Side stepping ?High Level Balance Comments: Requires constant truncal support and cuing for upright posture to prevent flexed trunk ?  ?  ?  ?  ? ?  ?Cognition Arousal/Alertness: Awake/alert ?Behavior During Therapy: [facility] for tasks assessed/performed ?Overall Cognitive Status: No family/caregiver present to determine baseline cognitive functioning ?Area of Impairment: Safety/judgement, Awareness, Problem solving ?  ?  ?  ?  ?  ?  ?  ?  ?  ?  ?  ?  ?Safety/Judgement: Decreased awareness of safety ?Awareness: Emergent ?Problem Solving: Requires verbal cues, Requires tactile cues ?General Comments: Pt. ready to progress walking and standing ?  ?  ? ?  ?Exercises General Exercises - Lower Extremity ?Long [employer]: Strengthening, Both, 10 reps, Seated ?Other Exercises ?Other Exercises: 5x STS with quick touch down. Pt to stand all the way up then control eccentric lower, when buttocks hit the chair he was to directly stand again. ? ?  ?   ? ?Pertinent Vitals/Pain Pain Assessment ?Pain Assessment: No/denies pain  ? ? ? ?PT Goals (current goals can now be found in the care plan section) Acute Rehab PT Goals ?Patient Stated Goal: walk in 3 weeks ?PT Goal Formulation: With patient ?Time For Goal Achievement: 09/17/21 ?Potential to Achieve Goals: Fair ?Progress towards PT goals: Progressing toward goals ? ?  ?Frequency ? ? ? Min 5X/week ? ? ? ?   ?PT Plan Current plan remains appropriate  ? ? ?   ?AM-PAC PT "6 Clicks" Mobility   ?Outcome Measure ? Help needed turning from your back to your side while in a flat bed without using bedrails?: A Little ?Help needed moving from lying on your back to sitting on the side of a flat bed without using bedrails?: A Little ?Help needed moving to and from a bed to a chair (including a wheelchair)?: A Lot ?Help needed standing up from a chair using your arms (e.g., wheelchair or bedside chair)?: A Lot ?Help needed to walk in hospital room?: A Lot ?Help needed climbing 3-5 steps with a railing? : Total ?6 Click Score: 13 ? ?  ?End of Session Equipment Utilized During Treatment: Gait belt ?Activity Tolerance: Patient tolerated treatment well ?Patient left: with call bell/phone within reach;with chair alarm set;in chair ?Nurse Communication: Mobility status ?PT Visit Diagnosis: Muscle weakness (generalized) (M62.81);Other symptoms and signs involving the nervous system (R29.898);Difficulty in walking, not elsewhere classified (R26.2);Other abnormalities of gait and mobility (R26.89) ?  ? ? ?Time: [id number] ?PT Time Calculation (min) (ACUTE ONLY): 49 min ? ?Charges:  $Gait Training: 8-22 mins ?$Therapeutic Activity: 8-22 mins ?$Neuromuscular Re-education: 8-22 mins          ?          ? ?Thermon Leyland, SPT ?Acute Rehab Services ? ? ? ?Thermon Leyland ?09/03/2021, 4:20 PM ? ?

## 2021-09-03 NOTE — Progress Notes (Signed)
?PROGRESS NOTE ? ? ? ?Brian [name]  [id number] DOB: 05-15-1961 DOA: 06/29/2021 ?PCP: Pcp, No  ? 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  He was admitted on 2/3 and underwent ACDF of C3-4, C4-5, C5-5, and C6-7.  Post-operatively he was progressing slowly with residual weakness in both hands but improving lower extremity strength and function.  He developed some difficulty swallowing on 2/5 and SLP ordered and placed on thickened liquids.   Overnight 2/6, patient with increased difficulty swallowing and was made NPO but also noted to have dysarthria and difficulty moving extremities with numbness.  SLP evaluated and found to be moderate aspiration risk.   Also progressively tachycardic, tachypneic, and now febrile.  Code stroke activated and taken for CT/ CTA head and neck and was given decadron 10mg  once.  NIHSS 18.  CTH was negative for acute findings, and CTA head neck did not reveal any LVO but noted significant prevertebral soft tissue swelling with foci of gas present at the ventral epidural space at C4-5, small collection not excluded. On 2/7, patient with worsening confusion and concern for airway involvement, PCCM consulted and remained in the intensive care unit until he is transferred to the floor on 3/2 and PCCM requested transition to [employer] for medical assistance while patient remains under the neurosurgery service. ? ?Significant Hospital Events: ?2/3 ACDF C3-4, C4-5, C5-5, and C6-7 w/ Dr. Annette Stable ?2/6 SLP eval for difficulty swallowing ?2/7 Code stroke overnight, neg for LVO, CT showing soft tissue swelling.  PCCM consulted for concern of airway management  and AMS.  Went for evacuation of hematoma  ?2/18 CT Abd/Pelvis: showing bilateral segmental Pes, started on heparin and showing worsening pneumonia, febrile to  103.  PCT rising, 34.6, restarted on abx ?2/19 possible aspiration w/ severe hypoxia; intubated; Switching from heparin to angiomax given subtherapeutic levels despite increase in rate. ?2/20 Echo shows normal LV systolic function. There was notation of a + McConnell's sign c/w a large pulmonary embolus which is consistent with the finding of bilateral segmental pulmonary emboli noted on CT 07/14/2021. ?2/21 rash appreciated on back; stopped cefepim/vanc switched to zosyn; required low dose levo with increase in fentanyl ?2/22 remains intubated; unable to extubate to do increase RR and thick secretions ?2/23 Ketamine infusion added ?2/24: weaning fentanyl.  ?2/26 extubated ?2/28 tachycardia persists ?3/2: Modified barium swallow, continues n.p.o. with core track in place, likely will need PEG tube. ?3/6: s/p IR G-tube placement ?3/16: Foley catheter discontinued ?3/24: E. coli UTI/bacteremia>> cefazolin x7 days per ID ?Now medically clear awaiting SNF placement still but per TOC has been difficult to place. ?4/5-4/6 Had a fall overnight and fell out of the bed and was unwitnessed but patient states he rolled out of the bed and did not get hurt. Has fall precautions now. ?  ? ? ?Assessment and Plan: ?* Cervical myelopathy ([place]); severe cervical spinal stenosis with cord compression s/p ACDF [id number] on 2/3, complicated by epidural hematoma s/p evacuation 2/7 ?Patient initially presented s/p recent fall with progressive upper and lower extremity weakness and tremors, He was found to have critical multilevel cervical spinal stenosis @ C3-6 with spinal cord compression and spinal cord signal change and underwent ACDF  C3-4, C4-5, C5-5, and C6/7 by neurosurgery Dr. Trenton Gammon on 06/29/2021.  Postoperative leg complicated by epidural hematoma s/p evacuation on 2/7. ?-Continue  PT/OT/SLP efforts, Awaiting SNF still but if improves enough maybe able to go Home with Home Health ?-Further as per neurosurgery recommendation. ? ?Acute respiratory failure with hypoxia ([place]) ?-Likely Multifactorial with significant dysphagia and recurrent aspiration pneumonia events during initial hospitalization following ACDF surgery with postoperative complications of postoperative cervical hematoma.   ?-Patient did require ventilatory support in the intensive care unit and was successfully extubated on 07/22/2021.   ?-Patient completed extensive course of empiric antibiotics.  Oxygen now weaned off, Now on room air. ?-SpO2: 100 % ?O2 Flow Rate (L/min): 0 L/min ?FiO2 (%): 40 %  ?-Continue Aspiration precautions ? ? ? ? ?Acute pulmonary embolism ([place]) ?-CTA chest showed bilateral segmental PE.   ?-LE [place] negative for DVT.  TTE with normal LV systolic function.  Supplemental oxygen weaned off. ?-C/w Eliquis 5mg  BID ? ?Acute metabolic encephalopathy ?-CT head, MRI brain, EEG, TSH, B12, ammonia unrevealing.  ?-Etiology likely multifactorial with acute respiratory failure secondary to aspiration pneumonia, bilateral pulmonary embolism.  -Completed course of antibiotics for aspiration pneumonia.  ?-Seroquel and clonazepam discontinued.  -Completed course of antibiotics for E. coli UTI/septicemia ? ?E. coli bacteremia ?-Urine culture and blood cultures x2 08/17/2028 positive for E. coli.   ?-Evaluated by infectious disease.   ?-Completed 7-day course of antibiotics per ID. ? ?Severe sepsis due to recurrent aspiration pneumonia ?-Likely recurrent issue during hospitalization, remains n.p.o. due to continued aspiration risk per SLP.   ?-Completed extensive course of antibiotics while under ICU care.  ?-S/p IR gtube 3/6. ?-Continue tube feeds nocturnally  ?-Cleared for dysphagia 3 mechanical soft diet with thin liquids per SLP; but with very little oral intake per RN; and patient with little desire for oral  intake ?-Continue aspiration precautions ?-Continue SLP efforts while inpatient ? ?Sinus tachycardia ?-Multifactorial including sepsis, dehydration, PE, agitation.   ?-Continue Metoprolol 100 mg twice daily ?-No longer on Telemetry ? ? ? ? ?Elevated liver enzymes ?-Etiology likely secondary to sepsis from aspiration pneumonia as above.  Acute hepatitis panel negative.  RUQ ultrasound unremarkable. ?--Continue monitor LFTs intermittently but they have resolved and AST was 31 and ALT was 28 on last check  ? ?Hyponatremia ?-Etiology likely secondary to dehydration.  Urine sodium low.  Continue tube feeds; now cleared by speech therapy for dysphagia 3 diet. ?-Na+  Went from 136 -> 130 -> 132 on last check  ?-Continue to Monitor and Trend intermittently  ? ?Tobacco use disorder ?-Encouraged cessation. ?-Nicotine patch; decreased to 7 mg on 3/15 ? ?Physical deconditioning ?-Significant weakness in all extremities as a result of cervical myelopathy and acute illness.  Slowly improving while inpatient. ?-Continue PT/OT efforts, will need SNF placement; difficult placement per TOC  ? ?Urinary retention ?Foley catheter discontinued on 08/09/2021 with no current signs of urinary retention. ?--Continue bethanechol ?--Continue bladder scan as needed ?--Continue monitor urinary output and strict I's and O's ?-Patient is +4.010 Liters since admission ?-Foley has been removed and now has a condom cath ? ?Protein-calorie malnutrition, severe ([place]) ?As evidenced by poor p.o. intake, significant muscle mass and subcu fat loss and significant weight loss (about 16 pounds since this hospitalization). ?Nutrition Problem: Severe Malnutrition (in the context of social/environmental circumstances) ?Etiology:  (inadequate energy intake) ?Signs/Symptoms: mild fat depletion, severe muscle depletion, severe muscle depletion ?Interventions: Refer to RD note for recommendations  ?--Dietitian following, appreciate assistance.   ?--s/p  IR gtube 3/6.   ?--Now cleared for dysphagia 3 diet ?--Continue tube feeds ?--continue SLP efforts ? ?Fall during current hospitalization ?-[place] out of bed  ?-Did not lose consciousness and it was unwitnessed but denies any head

## 2021-09-03 NOTE — Progress Notes (Signed)
Nutrition Follow-up ? ?DOCUMENTATION CODES:  ?Severe malnutrition in context of social or environmental circumstances, Underweight ? ?INTERVENTION:  ?Continue to meet 75-100% of pt's nutrition needs via nocturnal TF via PEG: ? -Osmolite 1.5 @ 54ml/hr x 16 hours (administer from 1700-0900) ? -63ml Prosource TF BID ? -163ml free water Q4H  ? ?This provides 2120 kcals, 107 grams of protein and 1036 mL of free water (2059ml total free water with flushes) ?Meets 100% minimum estimated calorie and protein needs ? ?NUTRITION DIAGNOSIS:  ?Severe Malnutrition related to social / environmental circumstances as evidenced by severe muscle depletion, severe fat depletion. -- ongoing  ? ?GOAL:  ?Patient will meet greater than or equal to 90% of their needs -- addressing via TF ? ?MONITOR:  ?TF tolerance ? ?REASON FOR ASSESSMENT:  ?Consult ?Assessment of nutrition requirement/status, Enteral/tube feeding initiation and management ? ?ASSESSMENT:  ?Pt with hx EtOH abuse, tobacco use, and spinal stenosis initially presented 2/3 for planned multilevel anterior cervical decompression and fusion surgery after experiencing progressive bilateral upper and lower extremity weakness and spasticity due to critical multilevel cervical spinal stenosis with spinal cord compression and signal change. ? ?02/03 - s/p anterior cervical discectomy with interbody fusion  ?02/05 - pt initially complained of worsening swallowing function ?02/06 - MBS, NPO per SLP ?02/07 - Code Stroke activated (negative) but transferred to ICU, s/p re-exploration anterior cervical fusion with evacuation of epidural hematoma ?02/08 - Cortrak tube placed (tip gastric), tube feeds initiated ?02/10 - MBS, diet advanced to dysphagia 2 with nectar-thick liquids, Cortrak removed ?02/11 - NPO ?02/13 - diet advanced to dysphagia 2 with nectar-thick liquids ?02/16 - diet advanced to dysphagia 2 with thin liquids ?02/19- Intubated after desaturation, possibly from PO intake ?02/20  - s/p cortrak tube; post pyloric  ?02/22 - TF off, golytely given ?02/23 - restarted and advancing TF ?02/26 - Extubated ?03/02 - failed MBS ?03/06 - PEG placed ?03/23 - diet advanced to dysphagia 2 with nectar thick liquids s/p MBSS ?03/24 - PEG replaced ?03/25 - diet advanced to dysphagia 2 with nectar thick liquids ?03/27 - diet downgraded to dysphagia 1 with nectar thick liquids ?03/28 - diet advanced to dysphagia 3 with thin liquids ?03/30 - diet downgraded to dysphagia 3 with nectar thick liquids ? ?Pt was transitioned to nocturnal feeds last week (Osmolite 1.5 @ 66ml x 16 hours w/ 104ml Prosource TF QID and 223ml free water flushes Q4H). Pt tolerating TF well per RN. Pt denies any N/V and continues to desire meeting nutrition needs via TF. Will adjust TF regimen to meet 100% of calorie and protein needs.  ? ?PO Intake continues to be minimal. 0-25% x last 8 recorded meals. ? ?UOP: 1853ml x24 hours ?I/O: -362ml since admit ? ?Admit wt 61.7 kg ?Current wt 52.2 kg ? ?Medications: folvite, mvi with minerals, thiamine ?Labs: ?Recent Labs  ?Lab 08/29/21 ?0421  ?NA 132*  ?K 4.3  ?CL 96*  ?CO2 30  ?BUN 30*  ?CREATININE 0.67  ?CALCIUM 10.3  ?MG 1.7  ?PHOS 3.8  ?GLUCOSE 100*  ?CBGs: 91-123x 24 hours ? ?Diet Order:   ?Diet Order   ? ?       ?  DIET DYS 3 Room service appropriate? Yes; Fluid consistency: Nectar Thick  Diet effective now       ?  ? ?  ?  ? ?  ? ?EDUCATION NEEDS:  ?Education needs have been addressed ? ?Skin:  Skin Assessment: Skin Integrity Issues: ?Skin Integrity Issues:: Other (Comment) ?Incisions:  closed neck ?Other: MASD buttocks ? ?Last BM:  4/7 ? ?Height:  ?Ht Readings from Last 1 Encounters:  ?07/15/21 5\' 10"  (1.778 m)  ? ?Weight:  ?Wt Readings from Last 1 Encounters:  ?09/01/21 52.2 kg  ? ?Ideal Body Weight:  78.2 kg ? ?BMI:  Body mass index is 16.51 kg/m?. ? ?Estimated Nutritional Needs:  ?Kcal:  2100-2300 ?Protein:  105-125 grams ?Fluid:  >2L/day ? ? ? ?Theone Stanley., MS, RD, LDN (she/her/hers) ?RD  pager number and weekend/on-call pager number located in [place]. ? ? ?

## 2021-09-04 DIAGNOSIS — R7881 Bacteremia: Secondary | ICD-10-CM | POA: Diagnosis not present

## 2021-09-04 DIAGNOSIS — G9341 Metabolic encephalopathy: Secondary | ICD-10-CM | POA: Diagnosis not present

## 2021-09-04 DIAGNOSIS — G959 Disease of spinal cord, unspecified: Secondary | ICD-10-CM | POA: Diagnosis not present

## 2021-09-04 DIAGNOSIS — R748 Abnormal levels of other serum enzymes: Secondary | ICD-10-CM | POA: Diagnosis not present

## 2021-09-04 DIAGNOSIS — D649 Anemia, unspecified: Secondary | ICD-10-CM

## 2021-09-04 DIAGNOSIS — D75839 Thrombocytosis, unspecified: Secondary | ICD-10-CM

## 2021-09-04 LAB — CBC WITH DIFFERENTIAL/PLATELET
Abs Immature Granulocytes: 0.01 10*3/uL (ref 0.00–0.07)
Basophils Absolute: 0 10*3/uL (ref 0.0–0.1)
Basophils Relative: 0 %
Eosinophils Absolute: 0.2 10*3/uL (ref 0.0–0.5)
Eosinophils Relative: 2 %
HCT: 27 % — ABNORMAL LOW (ref 39.0–52.0)
Hemoglobin: 8.9 g/dL — ABNORMAL LOW (ref 13.0–17.0)
Immature Granulocytes: 0 %
Lymphocytes Relative: 41 %
Lymphs Abs: 3.1 10*3/uL (ref 0.7–4.0)
MCH: 30.7 pg (ref 26.0–34.0)
MCHC: 33 g/dL (ref 30.0–36.0)
MCV: 93.1 fL (ref 80.0–100.0)
Monocytes Absolute: 1 10*3/uL (ref 0.1–1.0)
Monocytes Relative: 13 %
Neutro Abs: 3.3 10*3/uL (ref 1.7–7.7)
Neutrophils Relative %: 44 %
Platelets: 411 10*3/uL — ABNORMAL HIGH (ref 150–400)
RBC: 2.9 MIL/uL — ABNORMAL LOW (ref 4.22–5.81)
RDW: 16.6 % — ABNORMAL HIGH (ref 11.5–15.5)
WBC: 7.5 10*3/uL (ref 4.0–10.5)
nRBC: 0 % (ref 0.0–0.2)

## 2021-09-04 LAB — MAGNESIUM: Magnesium: 1.7 mg/dL (ref 1.7–2.4)

## 2021-09-04 LAB — COMPREHENSIVE METABOLIC PANEL
ALT: 31 U/L (ref 0–44)
AST: 38 U/L (ref 15–41)
Albumin: 3 g/dL — ABNORMAL LOW (ref 3.5–5.0)
Alkaline Phosphatase: 124 U/L (ref 38–126)
Anion gap: 7 (ref 5–15)
BUN: 26 mg/dL — ABNORMAL HIGH (ref 6–20)
CO2: 28 mmol/L (ref 22–32)
Calcium: 9.9 mg/dL (ref 8.9–10.3)
Chloride: 97 mmol/L — ABNORMAL LOW (ref 98–111)
Creatinine, Ser: 0.74 mg/dL (ref 0.61–1.24)
GFR, Estimated: >60 mL/min (ref >60)
Glucose, Bld: 118 mg/dL — ABNORMAL HIGH (ref 70–99)
Potassium: 4.2 mmol/L (ref 3.5–5.1)
Sodium: 132 mmol/L — ABNORMAL LOW (ref 135–145)
Total Bilirubin: 0.6 mg/dL (ref 0.3–1.2)
Total Protein: 7.7 g/dL (ref 6.5–8.1)

## 2021-09-04 LAB — GLUCOSE, CAPILLARY
Glucose-Capillary: 111 mg/dL — ABNORMAL HIGH (ref 70–99)
Glucose-Capillary: 107 mg/dL — ABNORMAL HIGH (ref 70–99)
Glucose-Capillary: 116 mg/dL — ABNORMAL HIGH (ref 70–99)
Glucose-Capillary: 99 mg/dL (ref 70–99)
Glucose-Capillary: 103 mg/dL — ABNORMAL HIGH (ref 70–99)
Glucose-Capillary: 125 mg/dL — ABNORMAL HIGH (ref 70–99)

## 2021-09-04 LAB — PHOSPHORUS: Phosphorus: 4.5 mg/dL (ref 2.5–4.6)

## 2021-09-04 MED ORDER — ORAL CARE MOUTH RINSE
15.0000 mL | Freq: Two times a day (BID) | OROMUCOSAL | Status: DC
  Administered 2021-09-04 – 2021-10-28 (×96): 15 mL via OROMUCOSAL

## 2021-09-04 NOTE — Progress Notes (Addendum)
?PROGRESS NOTE ? ? ? ?LATONIO FILOSA  [id number] DOB: Feb 11, 1961 DOA: 06/29/2021 ?PCP: Pcp, No  ? 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  He was admitted on 2/3 and underwent ACDF of C3-4, C4-5, C5-5, and C6-7.  Post-operatively he was progressing slowly with residual weakness in both hands but improving lower extremity strength and function.  He developed some difficulty swallowing on 2/5 and SLP ordered and placed on thickened liquids.   Overnight 2/6, patient with increased difficulty swallowing and was made NPO but also noted to have dysarthria and difficulty moving extremities with numbness.  SLP evaluated and found to be moderate aspiration risk.   Also progressively tachycardic, tachypneic, and now febrile.  Code stroke activated and taken for CT/ CTA head and neck and was given decadron 10mg  once.  NIHSS 18.  CTH was negative for acute findings, and CTA head neck did not reveal any LVO but noted significant prevertebral soft tissue swelling with foci of gas present at the ventral epidural space at C4-5, small collection not excluded. On 2/7, patient with worsening confusion and concern for airway involvement, PCCM consulted and remained in the intensive care unit until he is transferred to the floor on 3/2 and PCCM requested transition to [facility] for medical assistance while patient remains under the neurosurgery service. ? ?Significant Hospital Events: ?2/3 ACDF C3-4, C4-5, C5-5, and C6-7 w/ Dr. Annette Stable ?2/6 SLP eval for difficulty swallowing ?2/7 Code stroke overnight, neg for LVO, CT showing soft tissue swelling.  PCCM consulted for concern of airway management  and AMS.  Went for evacuation of hematoma  ?2/18 CT Abd/Pelvis: showing bilateral segmental Pes, started on heparin and showing worsening pneumonia, febrile to  103.  PCT rising, 34.6, restarted on abx ?2/19 possible aspiration w/ severe hypoxia; intubated; Switching from heparin to angiomax given subtherapeutic levels despite increase in rate. ?2/20 Echo shows normal LV systolic function. There was notation of a + McConnell's sign c/w a large pulmonary embolus which is consistent with the finding of bilateral segmental pulmonary emboli noted on CT 07/14/2021. ?2/21 rash appreciated on back; stopped cefepim/vanc switched to zosyn; required low dose levo with increase in fentanyl ?2/22 remains intubated; unable to extubate to do increase RR and thick secretions ?2/23 Ketamine infusion added ?2/24: weaning fentanyl.  ?2/26 extubated ?2/28 tachycardia persists ?3/2: Modified barium swallow, continues n.p.o. with core track in place, likely will need PEG tube. ?3/6: s/p IR G-tube placement ?3/16: Foley catheter discontinued ?3/24: E. coli UTI/bacteremia>> cefazolin x7 days per ID ?Now medically clear awaiting SNF placement still but per TOC has been difficult to place. ?4/5-4/6 Had a fall overnight and fell out of the bed and was unwitnessed but patient states he rolled out of the bed and did not get hurt. Has fall precautions now. ?  ? ? ?Assessment and Plan: ?* Cervical myelopathy ([place]); severe cervical spinal stenosis with cord compression s/p ACDF [id number] on 2/3, complicated by epidural hematoma s/p evacuation 2/7 ?Patient initially presented s/p recent fall with progressive upper and lower extremity weakness and tremors, He was found to have critical multilevel cervical spinal stenosis @ C3-6 with spinal cord compression and spinal cord signal change and underwent ACDF  C3-4, C4-5, C5-5, and C6/7 by neurosurgery Dr. Trenton Gammon on 06/29/2021.  Postoperative leg complicated by epidural hematoma s/p evacuation on 2/7. ?-Continue  PT/OT/SLP efforts, Awaiting SNF still but if improves enough maybe able to go Home with Home Health if continues to improve ?-Further as per neurosurgery recommendation. ? ?Acute respiratory failure with hypoxia ([place]) ?-Likely Multifactorial with significant dysphagia and recurrent aspiration pneumonia events during initial hospitalization following ACDF surgery with postoperative complications of postoperative cervical hematoma.   ?-Patient did require ventilatory support in the intensive care unit and was successfully extubated on 07/22/2021.   ?-Patient completed extensive course of empiric antibiotics.  Oxygen now weaned off, Now on room air. ?-SpO2: 100 % ?O2 Flow Rate (L/min): 0 L/min ?FiO2 (%): 40 % ; No longer on Supplemental O2 via [place] ?-Continue Aspiration precautions ? ? ? ? ?Acute pulmonary embolism ([place]) ?-CTA chest showed bilateral segmental PE.   ?-LE [place] negative for DVT.  TTE with normal LV systolic function.  Supplemental oxygen weaned off. ?-C/w Eliquis 5mg  BID ? ?Acute metabolic encephalopathy ?-CT head, MRI brain, EEG, TSH, B12, ammonia unrevealing.  ?-Etiology likely multifactorial with acute respiratory failure secondary to aspiration pneumonia, bilateral pulmonary embolism.  -Completed course of antibiotics for aspiration pneumonia.  ?-Seroquel and clonazepam discontinued.  -Completed course of antibiotics for E. coli UTI/septicemia ? ?E. coli bacteremia ?-Urine culture and blood cultures x2 08/17/2028 positive for E. coli.   ?-Evaluated by infectious disease.   ?-Completed 7-day course of antibiotics per ID. ? ?Severe sepsis due to recurrent aspiration pneumonia ?-Likely recurrent issue during hospitalization, remains n.p.o. due to continued aspiration risk per SLP.   ?-Completed extensive course of antibiotics while under ICU care.  ?-S/p IR gtube 3/6. ?-Continue tube feeds nocturnally  ?-Cleared for dysphagia 3 mechanical soft diet with thin liquids per SLP; but with very little oral intake  per RN; and patient with little desire for oral intake ?-Continue aspiration precautions ?-Continue SLP efforts while inpatient ? ?Sinus tachycardia ?-Multifactorial including sepsis, dehydration, PE, agitation.   ?-Continue Metoprolol 100 mg twice daily ?-No longer on Telemetry ? ? ? ? ?Elevated liver enzymes ?-Etiology likely secondary to sepsis from aspiration pneumonia as above.  Acute hepatitis panel negative.  RUQ ultrasound unremarkable. ?--Continue monitor LFTs intermittently but they have resolved and AST was 38 and ALT was 31 on last check  ? ?Hyponatremia ?-Etiology likely secondary to dehydration.  Urine sodium low.  Continue tube feeds; now cleared by speech therapy for dysphagia 3 diet. ?-Na+  Went from 136 -> 130 -> 132 x2 on the last few checks  ?-Continue to Monitor and Trend intermittently  ? ?Tobacco use disorder ?-Encouraged cessation. ?-Nicotine patch; decreased to 7 mg on 3/15 ? ?Physical deconditioning ?-Significant weakness in all extremities as a result of cervical myelopathy and acute illness.  Slowly improving while inpatient. ?-Continue PT/OT efforts, will need SNF placement; difficult placement per TOC  ? ?Urinary retention ?Foley catheter discontinued on 08/09/2021 with no current signs of urinary retention. ?--Continue bethanechol ?--Continue bladder scan as needed ?--Continue monitor urinary output and strict I's and O's ?-Patient is +3.044 Liters since admission ?-Foley has been removed and now has a condom cath ? ?Protein-calorie malnutrition, severe ([place]) ?As evidenced by poor p.o. intake, significant muscle mass and subcu fat loss and significant weight loss (about 16 pounds since this hospitalization). ?Nutrition Problem: Severe Malnutrition (in the context of social/environmental circumstances) ?Etiology:  (inadequate energy intake) ?Signs/Symptoms: mild fat depletion, severe muscle depletion, severe muscle depletion ?Interventions:  Refer to RD note for recommendations   ?--Dietitian following, appreciate assistance.   ?--s/p IR gtube 3/6.  ?--Now cleared for dysphagia 3 diet ?--Continue tube feeds ?--continue SLP efforts ? ?Thrombocytosis ?-Likely Reactive ?-Platelet Count wen

## 2021-09-04 NOTE — Plan of Care (Signed)
Pt stable throughout shift.  ? ?Problem: Education: ?Goal: Ability to verbalize activity precautions or restrictions will improve ?Outcome: Progressing ?Goal: Knowledge of the prescribed therapeutic regimen will improve ?Outcome: Progressing ?Goal: Understanding of discharge needs will improve ?Outcome: Progressing ?  ?Problem: Activity: ?Goal: Ability to avoid complications of mobility impairment will improve ?Outcome: Progressing ?Goal: Ability to tolerate increased activity will improve ?Outcome: Progressing ?Goal: Will remain free from falls ?Outcome: Progressing ?  ?Problem: Bowel/Gastric: ?Goal: Gastrointestinal status for postoperative course will improve ?Outcome: Progressing ?  ?Problem: Clinical Measurements: ?Goal: Ability to maintain clinical measurements within normal limits will improve ?Outcome: Progressing ?Goal: Postoperative complications will be avoided or minimized ?Outcome: Progressing ?Goal: Diagnostic test results will improve ?Outcome: Progressing ?  ?Problem: Pain Management: ?Goal: Pain level will decrease ?Outcome: Progressing ?  ?Problem: Skin Integrity: ?Goal: Will show signs of wound healing ?Outcome: Progressing ?  ?Problem: Health Behavior/Discharge Planning: ?Goal: Identification of resources available to assist in meeting health care needs will improve ?Outcome: Progressing ?  ?Problem: Bladder/Genitourinary: ?Goal: Urinary functional status for postoperative course will improve ?Outcome: Progressing ?  ?Problem: Safety: ?Goal: Ability to remain free from injury will improve ?Outcome: Progressing ?  ?Problem: Education: ?Goal: Knowledge of General Education information will improve ?Description: Including pain rating scale, medication(s)/side effects and non-pharmacologic comfort measures ?Outcome: Progressing ?  ?Problem: Health Behavior/Discharge Planning: ?Goal: Ability to manage health-related needs will improve ?Outcome: Progressing ?  ?Problem: Clinical Measurements: ?Goal:  Ability to maintain clinical measurements within normal limits will improve ?Outcome: Progressing ?Goal: Will remain free from infection ?Outcome: Progressing ?Goal: Diagnostic test results will improve ?Outcome: Progressing ?Goal: Respiratory complications will improve ?Outcome: Progressing ?Goal: Cardiovascular complication will be avoided ?Outcome: Progressing ?  ?Problem: Activity: ?Goal: Risk for activity intolerance will decrease ?Outcome: Progressing ?  ?Problem: Nutrition: ?Goal: Adequate nutrition will be maintained ?Outcome: Progressing ?  ?Problem: Coping: ?Goal: Level of anxiety will decrease ?Outcome: Progressing ?  ?Problem: Elimination: ?Goal: Will not experience complications related to bowel motility ?Outcome: Progressing ?Goal: Will not experience complications related to urinary retention ?Outcome: Progressing ?  ?Problem: Pain Managment: ?Goal: General experience of comfort will improve ?Outcome: Progressing ?  ?Problem: Safety: ?Goal: Ability to remain free from injury will improve ?Outcome: Progressing ?  ?Problem: Skin Integrity: ?Goal: Risk for impaired skin integrity will decrease ?Outcome: Progressing ?  ?

## 2021-09-04 NOTE — Assessment & Plan Note (Addendum)
-  Likely Reactive ?-Platelet Count 588 -> 516 -> 411 -> 399 -> 388 -> 390 -> 358 -> 386 x2 -> 348>>> 291>>> 327 ?-Continue to Monitor and trend intermittently  ?

## 2021-09-04 NOTE — Progress Notes (Signed)
Occupational Therapy Treatment ?Patient Details ?Name: Brian [name] ?MRN: [id number] ?DOB: 1961/04/14 ?Today's Date: 09/04/2021 ? ? ?History of present illness Pt is a 61 y/o male admitted 06/29/21 following C3-7 ACDF after progressive cervical myelopathy symptoms including ataxia, bil UE and LE shaking and weakness, and loss of function in his hands with subsequent fall. Pt initially had improvement in neurological symptoms, but postoperative course was complicated by dysphasia and ataxia secondary to and epidural hematoma with evacuation of hematoma on 2/7. Course complicated by delirium secondary to encephalopathy, sepsis secondary to aspiration PNA, reintubated 2/19-2/26 for airway protection, bilat segmental PEs 2/18. PEG placed 3/6. PMHx includes tobacco and EtOH use disorder. ?  ?OT comments ? Patient up in recliner upon arrival completing breakfast. Patient performed grooming tasks seated at sink with red foam on toothbrush to assist with grip and assistance opening and applying toothpaste. Patient stood from recliner to [place] to adjust gown and sit back in recliner with mod assist. Patient instructed on AROM to increase functional use of BUEs. Acute OT to continue to follow.   ? ?Recommendations for follow up therapy are one component of a multi-disciplinary discharge planning process, led by the attending physician.  Recommendations may be updated based on patient status, additional functional criteria and insurance authorization. ?   ?Follow Up Recommendations ? Skilled nursing-short term rehab (<3 hours/day)  ?  ?Assistance Recommended at Discharge Frequent or constant Supervision/Assistance  ?Patient can return home with the following ? Two people to help with walking and/or transfers;Direct supervision/assist for medications management;Assist for transportation;Help with stairs or ramp for entrance;Assistance with feeding;Two people to help with bathing/dressing/bathroom;Assistance with  cooking/housework;Direct supervision/assist for financial management ?  ?Equipment Recommendations ? Wheelchair (measurements OT);Wheelchair cushion (measurements OT);Hospital bed;BSC/3in1;Tub/shower seat  ?  ?Recommendations for Other Services   ? ?  ?Precautions / Restrictions Precautions ?Precautions: Fall ?Restrictions ?Weight Bearing Restrictions: No  ? ? ?  ? ?Mobility Bed Mobility ?Overal bed mobility: Needs Assistance ?  ?  ?  ?  ?  ?  ?General bed mobility comments: seated in recliner and remained in recliner at end of session ?  ? ?Transfers ?Overall transfer level: Needs assistance ?Equipment used: Rolling walker (2 wheels) ?Transfers: Sit to/from Stand ?Sit to Stand: Mod assist ?  ?  ?  ?  ?  ?General transfer comment: stood with RW to position gown and sit back in recliner ?  ?  ?Balance Overall balance assessment: Needs assistance ?Sitting-balance support: No upper extremity supported, Feet supported ?Sitting balance-Leahy Scale: Fair ?  ?Postural control: Posterior lean ?Standing balance support: Bilateral upper extremity supported, During functional activity ?Standing balance-Leahy Scale: Poor ?Standing balance comment: pt required min A for static standing balance ?  ?  ?  ?  ?  ?  ?  ?  ?  ?  ?  ?   ? ?ADL either performed or assessed with clinical judgement  ? ?ADL Overall ADL's : Needs assistance/impaired ?  ?  ?Grooming: Wash/dry hands;Wash/dry face;Oral care;With adaptive equipment;Sitting;Minimal assistance ?Grooming Details (indicate cue type and reason): red foam on toothbrush and assistance with toothpaste ?  ?  ?  ?  ?Upper Body Dressing : Moderate assistance;Sitting ?Upper Body Dressing Details (indicate cue type and reason): changed gown ?  ?  ?  ?  ?  ?  ?  ?  ?  ?General ADL Comments: BUE grip strength weak and difficulty holding toothbrush ?  ? ?Extremity/Trunk Assessment Upper Extremity Assessment ?RUE Deficits / Details:  limited shoulder flexion, quickly fatigues ?RUE Sensation:  decreased light touch;decreased proprioception ?RUE Coordination: decreased fine motor;decreased gross motor ?LUE Deficits / Details: limited shoulder flexion ?LUE Sensation: decreased light touch ?LUE Coordination: decreased fine motor;decreased gross motor ?  ?  ?  ?  ?  ? ?Vision   ?  ?  ?Perception   ?  ?Praxis   ?  ? ?Cognition Arousal/Alertness: Awake/alert ?Behavior During Therapy: [facility] for tasks assessed/performed ?Overall Cognitive Status: No family/caregiver present to determine baseline cognitive functioning ?Area of Impairment: Safety/judgement, Awareness, Problem solving ?  ?  ?  ?  ?  ?  ?  ?  ?  ?  ?  ?Following Commands: Follows one step commands consistently, Follows multi-step commands consistently ?Safety/Judgement: Decreased awareness of safety ?Awareness: Emergent ?Problem Solving: Requires verbal cues, Requires tactile cues ?General Comments: motivated towards therapy ?  ?  ?   ?Exercises Exercises: General Upper Extremity, Other exercises ?General Exercises - Upper Extremity ?Shoulder Flexion: AROM, Both, 10 reps, Seated ?Shoulder Extension: AROM, Both, 10 reps, Seated ?Elbow Flexion: Strengthening, Both, 10 reps, Seated, Theraband ?Theraband Level (Elbow Flexion): Level 1 (Yellow) ?Elbow Extension: Strengthening, Both, 10 reps, Seated, Theraband ?Theraband Level (Elbow Extension): Level 1 (Yellow) ?Other Exercises ?Other Exercises: grip strength addressed with squeeze ball ? ?  ?Shoulder Instructions   ? ? ?  ?General Comments    ? ? ?Pertinent Vitals/ Pain       Pain Assessment ?Pain Assessment: No/denies pain ?Pain Intervention(s): Monitored during session ? ?Home Living   ?  ?  ?  ?  ?  ?  ?  ?  ?  ?  ?  ?  ?  ?  ?  ?  ?  ?  ? ?  ?Prior Functioning/Environment    ?  ?  ?  ?   ? ?Frequency ? Min 2X/week  ? ? ? ? ?  ?Progress Toward Goals ? ?OT Goals(current goals can now be found in the care plan section) ? Progress towards OT goals: Progressing toward goals ? ?Acute Rehab OT Goals ?Patient  Stated Goal: get stronger ?OT Goal Formulation: With patient ?Time For Goal Achievement: 09/05/21 ?Potential to Achieve Goals: Fair ?ADL Goals ?Pt Will Perform Eating: with min assist;with assist to don/doff brace/orthosis;with adaptive utensils;sitting ?Pt Will Perform Grooming: sitting;with min assist ?Pt Will Perform Upper Body Bathing: with mod assist;sitting ?Pt Will Transfer to Toilet: with min assist;stand pivot transfer;bedside commode ?Pt/caregiver will Perform Home Exercise Program: Increased ROM;Increased strength;Both right and left upper extremity;With theraputty;With minimal assist;With written HEP provided ?Additional ADL Goal #1: Pt will grasp objects with R hand and bring to mouth 10/10 trials in prep for self feeding ?Additional ADL Goal #2: Pt will grasp/release 5/5 objects with L hand to increase indep with ADLs  ?Plan Discharge plan remains appropriate   ? ?Co-evaluation ? ? ?   ?  ?  ?  ?  ? ?  ?AM-PAC OT "6 Clicks" Daily Activity     ?Outcome Measure ? ? Help from another person eating meals?: A Lot ?Help from another person taking care of personal grooming?: A Lot ?Help from another person toileting, which includes using toliet, bedpan, or urinal?: A Lot ?Help from another person bathing (including washing, rinsing, drying)?: A Lot ?Help from another person to put on and taking off regular upper body clothing?: A Lot ?Help from another person to put on and taking off regular lower body clothing?: A Lot ?6 Click Score: 12 ? ?  ?  End of Session Equipment Utilized During Treatment: Gait belt;Rolling walker (2 wheels) ? ?OT Visit Diagnosis: Other abnormalities of gait and mobility (R26.89);Muscle weakness (generalized) (M62.81);Feeding difficulties (R63.3) ?  ?Activity Tolerance Patient tolerated treatment well ?  ?Patient Left in chair;with call bell/phone within reach;with chair alarm set ?  ?Nurse Communication Mobility status;Need for lift equipment ?  ? ?   ? ?Time: [id number] ?OT Time  Calculation (min): 32 min ? ?Charges: OT General Charges ?$OT Visit: 1 Visit ?OT Treatments ?$Self Care/Home Management : 23-37 mins ? ?Lodema Hong, OTA ?Acute Rehabilitation Services  ?Pager [phone] ?Office [phone] ? ?

## 2021-09-04 NOTE — Progress Notes (Signed)
Patient doing well, denies any pain. 4+/5 strength in all extremities. He is eager to go home. Continue therapy today. ?

## 2021-09-04 NOTE — Progress Notes (Signed)
Physical Therapy Treatment ?Patient Details ?Name: Brian [name] ?MRN: [id number] ?DOB: [birth year] ?Today's Date: 09/04/2021 ? ? ?History of Present Illness Pt is a 61 y/o male admitted 06/29/21 following C3-7 ACDF after progressive cervical myelopathy symptoms including ataxia, bil UE and LE shaking and weakness, and loss of function in his hands with subsequent fall. Pt initially had improvement in neurological symptoms, but postoperative course was complicated by dysphasia and ataxia secondary to and epidural hematoma with evacuation of hematoma on 2/7. Course complicated by delirium secondary to encephalopathy, sepsis secondary to aspiration PNA, reintubated 2/19-2/26 for airway protection, bilat segmental PEs 2/18. PEG placed 3/6. PMHx includes tobacco and EtOH use disorder. ? ?  ?PT Comments  ? ? Received pt supine in bed requesting to get washed up. RN present to disconnect feed and NT present to check vitals. Pt transferred supine<>sitting EOB with supervision with HOB elevated and washed upper body and donned clean gown with close supervision - noted mild posterior lean. Pt performed all sit<>stands with RW and mod A and ambulated 27ft x 1, 3ft x 1 and 3ft x 1 with RW and mod A with +2 for recliner follow. Pt is progressing well with ambulation and remains extremely motivated during sessions. Acute PT to cont to follow.  ?   ?Recommendations for follow up therapy are one component of a multi-disciplinary discharge planning process, led by the attending physician.  Recommendations may be updated based on patient status, additional functional criteria and insurance authorization. ? ?Follow Up Recommendations ? Skilled nursing-short term rehab (<3 hours/day) ?  ?  ?Assistance Recommended at Discharge Frequent or constant Supervision/Assistance  ?Patient can return home with the following Two people to help with walking and/or transfers;A lot of help with bathing/dressing/bathroom;Assistance with  cooking/housework;Assistance with feeding;Direct supervision/assist for medications management;Direct supervision/assist for financial management;Assist for transportation;Help with stairs or ramp for entrance ?  ?Equipment Recommendations ? Wheelchair (measurements PT);Wheelchair cushion (measurements PT);Hospital bed;Rolling walker (2 wheels)  ?  ?Recommendations for Other Services   ? ? ?  ?Precautions / Restrictions Precautions ?Precautions: Fall ?Restrictions ?Weight Bearing Restrictions: No  ?  ? ?Mobility ? Bed Mobility ?Overal bed mobility: Needs Assistance ?Bed Mobility: Supine to Sit ?Rolling: Supervision ?  ?Supine to sit: Supervision, HOB elevated ?  ?  ?General bed mobility comments: slight posterior lean sitting EOB while washing upper/lower body ?Patient Response: Cooperative ? ?Transfers ?Overall transfer level: Needs assistance ?Equipment used: Rolling walker (2 wheels) ?Transfers: Sit to/from Stand ?Sit to Stand: Mod assist ?  ?  ?  ?  ?  ?General transfer comment: required cues for upright posture/gaze and to widen BOS ?  ? ?Ambulation/Gait ?Ambulation/Gait assistance: Mod assist, +2 safety/equipment ?Gait Distance (Feet): 25 Feet ?Assistive device: Rolling walker (2 wheels) ?Gait Pattern/deviations: Decreased stride length, Step-to pattern, Ataxic, Decreased dorsiflexion - left, Decreased dorsiflexion - right, Scissoring, Trunk flexed, Decreased step length - right, Decreased step length - left, Narrow base of support ?Gait velocity: Decreased ?Gait velocity interpretation: <1.31 ft/sec, indicative of household ambulator ?Pre-gait activities: standing marches ?General Gait Details: pt required manual facilitation from therapist to steer RW and cues for RW safety and to widen BOS ? ? ?Stairs ?  ?  ?  ?  ?  ? ? ?Wheelchair Mobility ?  ? ?Modified Rankin (Stroke Patients Only) ?Modified Rankin (Stroke Patients Only) ?Modified Rankin: Moderately severe disability ? ? ?  ?Balance Overall balance  assessment: Needs assistance ?Sitting-balance support: No upper extremity supported, Feet supported ?Sitting balance-Leahy Scale:  Fair ?  ?Postural control: Posterior lean ?Standing balance support: Bilateral upper extremity supported, During functional activity (RW) ?Standing balance-Leahy Scale: Poor ?Standing balance comment: pt required min A for static standing balance ?  ?  ?  ?  ?  ?  ?  ?  ?  ?  ?  ?  ? ?  ?Cognition Arousal/Alertness: Awake/alert ?Behavior During Therapy: [facility] for tasks assessed/performed ?Overall Cognitive Status: No family/caregiver present to determine baseline cognitive functioning ?Area of Impairment: Safety/judgement, Awareness, Problem solving ?  ?  ?  ?  ?  ?  ?  ?  ?  ?  ?  ?  ?Safety/Judgement: Decreased awareness of safety ?  ?  ?General Comments: pt eager to walk ?  ?  ? ?  ?Exercises   ? ?  ?General Comments   ?  ?  ? ?Pertinent Vitals/Pain Pain Assessment ?Pain Assessment: Faces ?Faces Pain Scale: Hurts a little bit ?Pain Location: low back with ambulation ?Pain Descriptors / Indicators: Grimacing, Spasm, Discomfort ?Pain Intervention(s): Monitored during session, Repositioned, Limited activity within patient's tolerance  ? ? ?Home Living   ?  ?  ?  ?  ?  ?  ?  ?  ?  ?   ?  ?Prior Function    ?  ?  ?   ? ?PT Goals (current goals can now be found in the care plan section) Acute Rehab PT Goals ?Patient Stated Goal: walk in 3 weeks ?PT Goal Formulation: With patient ?Time For Goal Achievement: 09/17/21 ?Potential to Achieve Goals: Fair ?Progress towards PT goals: Progressing toward goals ? ?  ?Frequency ? ? ? Min 5X/week ? ? ? ?  ?PT Plan Current plan remains appropriate  ? ? ?Co-evaluation   ?  ?  ?  ?  ? ?  ?AM-PAC PT "6 Clicks" Mobility   ?Outcome Measure ? Help needed turning from your back to your side while in a flat bed without using bedrails?: A Little ?Help needed moving from lying on your back to sitting on the side of a flat bed without using bedrails?: A Little ?Help  needed moving to and from a bed to a chair (including a wheelchair)?: A Lot ?Help needed standing up from a chair using your arms (e.g., wheelchair or bedside chair)?: A Lot ?Help needed to walk in hospital room?: A Lot ?Help needed climbing 3-5 steps with a railing? : Total ?6 Click Score: 13 ? ?  ?End of Session Equipment Utilized During Treatment: Gait belt ?Activity Tolerance: Patient tolerated treatment well ?Patient left: with call bell/phone within reach;with chair alarm set;in chair ?Nurse Communication: Mobility status ?PT Visit Diagnosis: Muscle weakness (generalized) (M62.81);Other symptoms and signs involving the nervous system (R29.898);Difficulty in walking, not elsewhere classified (R26.2);Other abnormalities of gait and mobility (R26.89);Ataxic gait (R26.0) ?  ? ? ?Time: 2458-0998 ?PT Time Calculation (min) (ACUTE ONLY): 33 min ? ?Charges:  $Gait Training: 8-22 mins ?$Therapeutic Activity: 8-22 mins          ?          ? ?Raechel Chute PT, DPT  ?Alfonso Patten ?09/04/2021, 9:02 AM ? ?

## 2021-09-04 NOTE — Assessment & Plan Note (Addendum)
-  Patient's Hgb/Hct has been relatively stable ?-Hgb/Hct is 9.7/29.7 (09/21/2021) ?-Continue to Monitor for S/Sx of Bleeding; No overt bleeding noted ?-Repeat CBC intermittently  ?

## 2021-09-05 DIAGNOSIS — G959 Disease of spinal cord, unspecified: Secondary | ICD-10-CM | POA: Diagnosis not present

## 2021-09-05 LAB — GLUCOSE, CAPILLARY
Glucose-Capillary: 117 mg/dL — ABNORMAL HIGH (ref 70–99)
Glucose-Capillary: 88 mg/dL (ref 70–99)
Glucose-Capillary: 115 mg/dL — ABNORMAL HIGH (ref 70–99)
Glucose-Capillary: 96 mg/dL (ref 70–99)
Glucose-Capillary: 98 mg/dL (ref 70–99)

## 2021-09-05 MED ORDER — METOPROLOL TARTRATE 25 MG/10 ML ORAL SUSPENSION
50.0000 mg | Freq: Two times a day (BID) | ORAL | Status: DC
  Administered 2021-09-05: 50 mg
  Filled 2021-09-05 (×2): qty 20

## 2021-09-05 MED ORDER — SODIUM CHLORIDE 0.9 % IV BOLUS
500.0000 mL | Freq: Once | INTRAVENOUS | Status: AC
  Administered 2021-09-05: 500 mL via INTRAVENOUS

## 2021-09-05 MED ORDER — METOPROLOL TARTRATE 25 MG/10 ML ORAL SUSPENSION
12.5000 mg | Freq: Two times a day (BID) | ORAL | Status: DC
  Administered 2021-09-05 – 2021-09-22 (×29): 12.5 mg
  Filled 2021-09-05 (×36): qty 5

## 2021-09-05 NOTE — Progress Notes (Signed)
Speech Language Pathology Treatment: Dysphagia  ?Patient Details ?Name: Brian [name] ?MRN: [id number] ?DOB: March 25, 1961 ?Today's Date: 09/05/2021 ?Time: 1550-1610 ?SLP Time Calculation (min) (ACUTE ONLY): 20 min ? ?Assessment / Plan / Recommendation ?Clinical Impression ? Patient seen by SLP for skilled treatment focused on dysphagia goals with use of EMST trainer for improving cough strength and providing patient with eduation regarding foods he could tolerate on Dys 3 solids. He reported that he has never been that interested in food and typically he would eat a sandwich and drink a Boost supplement for lunch. SLP told patient that he could order some soft bread sandwiches, etc. on his Dys 3 solids diet and he reported that he did not know that. SLP informed RN but also encouraged patient to request help in ordering preferred food items. Patient able to demonstrate correct use of EMST trainer and SLP increased the resitance to 15 cm/H2O and he judged difficulty to be a 6/10. He performed a total of 15 reps and SLP encouraged him to try to find 2-3 times a day to do total of 25 each time with rest breaks. SLP plans to continue to follow patient acutely. ? ?  ?HPI HPI: Pt is a 61 y/o admitted 06/29/21 following C3-7 ACDF after progressive cervical myelopathy symptoms with subsequent fall and severe incomplete SCI. Significant prevertebral swelling per imaging. Pt developed some difficulty swallowing on 2/5 and SLP consulted. MBS 2/6 revealed severe edema s/p ACDF, oral holding, a pharyngeal delay, inconsistent hyolaryngeal elevation, incomplete epiglottic inversion due to edema. Aspiration noted accross consistencies and and NPO status was recommended. Repeat MBS 2/10 showed significantly reduced pharyngeal edema and a dysphagia 2 diet with nectar thick liquids was initiated. Pt transitioned to thin liquids 2/16 and susbequently returned to nectar thick 2/17 due to concern for aspiration. Pt developed pna, possible  aspiration, requiring intubation 2/19-26. Cortrak placed 2/20. PEG placed in IR on 3/6. No significant PMH. This is pt's 3rd MBS ?  ?   ?SLP Plan ? Continue with current plan of care ? ?  ?  ?Recommendations for follow up therapy are one component of a multi-disciplinary discharge planning process, led by the attending physician.  Recommendations may be updated based on patient status, additional functional criteria and insurance authorization. ?  ? ?Recommendations  ?Diet recommendations: Dysphagia 3 (mechanical soft);Nectar-thick liquid ?Liquids provided via: Cup;Straw ?Medication Administration: Whole meds with puree ?Supervision: Staff to assist with self feeding;Full supervision/cueing for compensatory strategies ?Compensations: Slow rate;Small sips/bites ?Postural Changes and/or Swallow Maneuvers: Seated upright 90 degrees  ?   ?    ?   ? ? ? ? Oral Care Recommendations: Oral care BID ?Follow Up Recommendations: Skilled nursing-short term rehab (<3 hours/day) ?Assistance recommended at discharge: Intermittent Supervision/Assistance ?SLP Visit Diagnosis: Dysphagia, oropharyngeal phase (R13.12) ?Plan: Continue with current plan of care ? ? ? ? ?  ?  ? ? ?Angela Nevin, MA, CCC-SLP ?Speech Therapy ? ?

## 2021-09-05 NOTE — Progress Notes (Signed)
Physical Therapy Treatment ?Patient Details ?Name: Brian [name] ?MRN: [id number] ?DOB: 1960-10-11 ?Today's Date: 09/05/2021 ? ? ?History of Present Illness Pt is a 61 y/o male admitted 06/29/21 following C3-7 ACDF after progressive cervical myelopathy symptoms including ataxia, bil UE and LE shaking and weakness, and loss of function in his hands with subsequent fall. Pt initially had improvement in neurological symptoms, but postoperative course was complicated by dysphasia and ataxia secondary to and epidural hematoma with evacuation of hematoma on 2/7. Course complicated by delirium secondary to encephalopathy, sepsis secondary to aspiration PNA, reintubated 2/19-2/26 for airway protection, bilat segmental PEs 2/18. PEG placed 3/6. PMHx includes tobacco and EtOH use disorder. ? ?  ?PT Comments  ? ? Continued progression on functional mobility, strengthening, balance, and gait today. The pt shows improvement with his bed mobility and transfer ability. He was able to get from supine to sit and performing a lateral scoot to the chair min guard. His functional endurance and strength has also shown improvement with him performing repeated touch down STS min A as well as performing assisted gait min-mod A. Pt. Ambulated a total of 78 feet with 1 HHA and R railing with close chair follow. He is still limited by overall LE endurance, functional use of his hands, strength and balance. He would benefit from continued PT services to maximize independence for tranfers, w/c training, functional balance, mobility, and gait training. Plan and d/c recommendations remain unchanged. PT to follow up acutely as able.  ?  ?Recommendations for follow up therapy are one component of a multi-disciplinary discharge planning process, led by the attending physician.  Recommendations may be updated based on patient status, additional functional criteria and insurance authorization. ? ?Follow Up Recommendations ? Skilled nursing-short term  rehab (<3 hours/day) ?  ?  ?Assistance Recommended at Discharge Frequent or constant Supervision/Assistance  ?Patient can return home with the following Two people to help with walking and/or transfers;A lot of help with bathing/dressing/bathroom;Assistance with cooking/housework;Assistance with feeding;Direct supervision/assist for medications management;Direct supervision/assist for financial management;Assist for transportation;Help with stairs or ramp for entrance ?  ?Equipment Recommendations ? Wheelchair (measurements PT);Wheelchair cushion (measurements PT);Hospital bed  ?  ?   ?Precautions / Restrictions Precautions ?Precautions: Fall ?Restrictions ?Weight Bearing Restrictions: No  ?  ? ?Mobility ? Bed Mobility ?Overal bed mobility: Needs Assistance ?Bed Mobility: Supine to Sit ?  ?  ?Supine to sit: Supervision, HOB elevated ?  ?  ?General bed mobility comments: seated in recliner and remained in recliner at end of session ?  ? ?Transfers ?Overall transfer level: Needs assistance ?Equipment used: None ?Transfers: Bed to chair/wheelchair/BSC, Sit to/from Stand ?Sit to Stand: Min assist (min A progressing to min guard with repeated practice) ?  ?  ?  ?  ? Lateral/Scoot Transfers: Min guard ?General transfer comment: Increased time, set up, and min guard for saftey STS x 3, 8, 5 ?  ? ?Ambulation/Gait ?Ambulation/Gait assistance: Mod assist, +2 safety/equipment (min-mod) ?Gait Distance (Feet): 75 Feet (1x32 1x36) ?Assistive device: 1 person hand held assist (+ R rail) ?Gait Pattern/deviations: Decreased stride length, Step-to pattern, Ataxic, Decreased dorsiflexion - left, Decreased dorsiflexion - right, Scissoring, Trunk flexed, Decreased step length - right, Decreased step length - left, Narrow base of support ?Gait velocity: Decreased ?  ?  ?General Gait Details: Pt. requires min A for power up during STS and for trunk support during gait. Occasionally requiring mod A for balance correct in LE scissored. Pt  has poor awareness of LE fatigue  and almost slid out of chair requiring total A from SPT for scoot back to chair. ? ? ? ? ? ?  ?Balance Overall balance assessment: Needs assistance ?Sitting-balance support: No upper extremity supported, Feet supported ?Sitting balance-Leahy Scale: Fair ?Sitting balance - Comments: Able to sit EOB and scoot without difficulty ?  ?Standing balance support: Bilateral upper extremity supported, During functional activity ?Standing balance-Leahy Scale: Poor ?Standing balance comment: pt required min A for static balance and min-mod A for dynamic balance. ?  ?  ?  ?  ?  ?  ?  ?  ?  ?  ?  ?  ? ?  ?Cognition Arousal/Alertness: Awake/alert ?Behavior During Therapy: [facility] for tasks assessed/performed ?Overall Cognitive Status: No family/caregiver present to determine baseline cognitive functioning ?Area of Impairment: Memory, Safety/judgement ?  ?  ?  ?  ?  ?  ?  ?  ?  ?  ?Memory: Decreased short-term memory ?  ?Safety/Judgement: Decreased awareness of safety ?  ?  ?General Comments: Pt. has difficultly recalling what was done in previous therapy sessions. ?  ?  ? ?  ?Exercises General Exercises - Lower Extremity ?Hip ABduction/ADduction: Strengthening, Both, 10 reps, Supine (2x w/ yellow band and 2 sec hold) ?Other Exercises ?Other Exercises: STS with quick touch down. Pt to stand all the way up then control eccentric lower, when buttocks hit the chair he was to directly stand again. x3, x8, x5 ? ?  ?   ? ?Pertinent Vitals/Pain Pain Assessment ?Pain Assessment: Faces ?Faces Pain Scale: Hurts a little bit ?Pain Location: B hands ?Pain Descriptors / Indicators: Tightness, Sore ?Pain Intervention(s): Limited activity within patient's tolerance, Monitored during session  ? ? ?   ?   ? ?PT Goals (current goals can now be found in the care plan section) Acute Rehab PT Goals ?Patient Stated Goal: walk in 3 weeks ?PT Goal Formulation: With patient ?Time For Goal Achievement: 09/17/21 ?Potential to  Achieve Goals: Good ?Progress towards PT goals: Progressing toward goals ? ?  ?Frequency ? ? ? Min 5X/week ? ? ? ?  ?PT Plan Current plan remains appropriate  ? ? ?   ?AM-PAC PT "6 Clicks" Mobility   ?Outcome Measure ? Help needed turning from your back to your side while in a flat bed without using bedrails?: A Little ?Help needed moving from lying on your back to sitting on the side of a flat bed without using bedrails?: A Little ?Help needed moving to and from a bed to a chair (including a wheelchair)?: A Little ?Help needed standing up from a chair using your arms (e.g., wheelchair or bedside chair)?: A Lot ?Help needed to walk in hospital room?: A Lot ?Help needed climbing 3-5 steps with a railing? : Total ?6 Click Score: 14 ? ?  ?End of Session Equipment Utilized During Treatment: Gait belt ?Activity Tolerance: Patient tolerated treatment well ?Patient left: with call bell/phone within reach;with chair alarm set;in chair ?Nurse Communication: Mobility status ?PT Visit Diagnosis: Muscle weakness (generalized) (M62.81);Other symptoms and signs involving the nervous system (R29.898);Difficulty in walking, not elsewhere classified (R26.2);Other abnormalities of gait and mobility (R26.89) ?  ? ? ?Time: [id number] ?PT Time Calculation (min) (ACUTE ONLY): 50 min ? ?Charges:  $Gait Training: 8-22 mins ?$Therapeutic Activity: 8-22 mins ?$Neuromuscular Re-education: 8-22 mins          ?          ? ?Thermon Leyland, SPT ?Acute Rehab Services ? ? ? ?Thermon Leyland ?09/05/2021,  11:39 AM ? ?

## 2021-09-05 NOTE — Progress Notes (Signed)
?Medical consultation PROGRESS NOTE ? ? ? ?Brian [name]  [id number] DOB: Dec 10, 1960 DOA: 06/29/2021 ?PCP: Pcp, No  ? ? ? ?Brief Narrative:  ?JERVON WARMKESSEL is a 61 year old male with past medical history significant for tobacco and EtOH use disorder who was admitted by neurosurgery for cervical myelopathy due to critical multilevel cervical spinal canal stenosis C3-6 with spinal cord compression and spinal cord signal change after recent fall with progressive upper and lower extremity weakness and tremors.  He was admitted on 2/3 and underwent ACDF of C3-4, C4-5, C5-5, and C6-7.  Post-operatively he was progressing slowly with residual weakness in both hands but improving lower extremity strength and function.  He developed some difficulty swallowing on 2/5 and SLP ordered and placed on thickened liquids.   Overnight 2/6, patient with increased difficulty swallowing and was made NPO but also noted to have dysarthria and difficulty moving extremities with numbness.  SLP evaluated and found to be moderate aspiration risk.   Also progressively tachycardic, tachypneic, and now febrile.  Code stroke activated and taken for CT/ CTA head and neck and was given decadron 10mg  once.  NIHSS 18.  CTH was negative for acute findings, and CTA head neck did not reveal any LVO but noted significant prevertebral soft tissue swelling with foci of gas present at the ventral epidural space at C4-5, small collection not excluded. On 2/7, patient with worsening confusion and concern for airway involvement, PCCM consulted and remained in the intensive care unit until he is transferred to the floor on 3/2 and PCCM requested transition to [facility] for medical assistance while patient remains under the neurosurgery service. ? ?Significant Hospital Events: ?2/3 ACDF C3-4, C4-5, C5-5, and C6-7 w/ Dr. Annette Stable ?2/6 SLP eval for difficulty swallowing ?2/7 Code stroke overnight, neg for LVO, CT showing soft tissue swelling.  PCCM consulted for  concern of airway management and AMS.  Went for evacuation of hematoma  ?2/18 CT Abd/Pelvis: showing bilateral segmental Pes, started on heparin and showing worsening pneumonia, febrile to  103.  PCT rising, 34.6, restarted on abx ?2/19 possible aspiration w/ severe hypoxia; intubated; Switching from heparin to angiomax given subtherapeutic levels despite increase in rate. ?2/20 Echo shows normal LV systolic function. There was notation of a + McConnell's sign c/w a large pulmonary embolus which is consistent with the finding of bilateral segmental pulmonary emboli noted on CT 07/14/2021. ?2/21 rash appreciated on back; stopped cefepim/vanc switched to zosyn; required low dose levo with increase in fentanyl ?2/22 remains intubated; unable to extubate to do increase RR and thick secretions ?2/23 Ketamine infusion added ?2/24: weaning fentanyl.  ?2/26 extubated ?2/28 tachycardia persists ?3/2: Modified barium swallow, continues n.p.o. with core track in place, likely will need PEG tube. ?3/6: s/p IR G-tube placement ?3/16: Foley catheter discontinued ?3/24: E. coli UTI/bacteremia>> cefazolin x7 days per ID ?Now medically clear awaiting SNF placement still but per TOC has been difficult to place. ?4/5-4/6 Had a fall overnight and fell out of the bed and was unwitnessed but patient states he rolled out of the bed and did not get hurt. Has fall precautions now. ? ? ? ?Subjective: ? ?Bp low ,. He denies any symptom ?He is on dysphagia diet, nutrition per tube at night and getting  free water flushes  ?Rn reports he isaaox3, but seems to have confusuion ? ?Assessment & Plan: ? Principal Problem: ?  Cervical myelopathy ([place]); severe cervical spinal stenosis with cord compression s/p ACDF [id number] on 2/3, complicated by epidural  hematoma s/p evacuation 2/7 ?Active Problems: ?  Acute respiratory failure with hypoxia ([place]) ?  Acute pulmonary embolism ([place]) ?  Acute metabolic encephalopathy ?  Severe sepsis due to recurrent  aspiration pneumonia ?  E. coli bacteremia ?  Sinus tachycardia ?  Elevated liver enzymes ?  Hyponatremia ?  Tobacco use disorder ?  Physical deconditioning ?  Urinary retention ?  Protein-calorie malnutrition, severe ([place]) ?  Lumbar back pain ?  Fall during current hospitalization ?  Normocytic anemia ?  Thrombocytosis ? ? ? ?Assessment and Plan: ? ?* Cervical myelopathy ([facility]); severe cervical spinal stenosis with cord compression s/p ACDF [id number] on 2/3, complicated by epidural hematoma s/p evacuation 2/7 ?Patient initially presented s/p recent fall with progressive upper and lower extremity weakness and tremors, He was found to have critical multilevel cervical spinal stenosis @ C3-6 with spinal cord compression and spinal cord signal change and underwent ACDF C3-4, C4-5, C5-5, and C6/7 by neurosurgery Dr. Trenton Gammon on 06/29/2021.  Postoperative leg complicated by epidural hematoma s/p evacuation on 2/7. ?-Continue PT/OT/SLP efforts, Awaiting SNF still but if improves enough maybe able to go Home with Home Health if continues to improve ?-Further as per neurosurgery recommendation. ? ?Acute respiratory failure with hypoxia ([place]) ?-Likely Multifactorial with significant dysphagia and recurrent aspiration pneumonia events during initial hospitalization following ACDF surgery with postoperative complications of postoperative cervical hematoma.   ?-Patient did require ventilatory support in the intensive care unit and was successfully extubated on 07/22/2021.   ?-Patient completed extensive course of empiric antibiotics.  Oxygen now weaned off, Now on room air. ?-SpO2: 100 % ?O2 Flow Rate (L/min): 0 L/min ?FiO2 (%): 40 % ; No longer on Supplemental O2 via [place] ?-Continue Aspiration precautions ? ? ? ? ?Acute pulmonary embolism ([place]) ?-CTA chest showed bilateral segmental PE.   ?-LE [place] negative for DVT.  TTE with normal LV systolic function.  Supplemental oxygen weaned off. ?-C/w Eliquis 5mg  BID ? ?Acute metabolic  encephalopathy ?-CT head, MRI brain, EEG, TSH, B12, ammonia unrevealing.  ?-Etiology likely multifactorial with acute respiratory failure secondary to aspiration pneumonia, bilateral pulmonary embolism.  -Completed course of antibiotics for aspiration pneumonia.  ?-Seroquel and clonazepam discontinued.  -Completed course of antibiotics for E. coli UTI/septicemia ? ?E. coli bacteremia ?-Urine culture and blood cultures x2 08/17/2028 positive for E. coli.   ?-Evaluated by infectious disease.   ?-Completed 7-day course of antibiotics per ID. ? ?Severe sepsis due to recurrent aspiration pneumonia ?-Likely recurrent issue during hospitalization, remains n.p.o. due to continued aspiration risk per SLP.   ?-Completed extensive course of antibiotics while under ICU care.  ?-S/p IR gtube 3/6. ?-Continue tube feeds nocturnally  ?-Cleared for dysphagia 3 mechanical soft diet with thin liquids per SLP; but with very little oral intake per RN; and patient with little desire for oral intake ?-Continue aspiration precautions ?-Continue SLP efforts while inpatient ? ?Sinus tachycardia ?-Multifactorial including sepsis, dehydration, PE, agitation.   ?-Continue Metoprolol 100 mg twice daily ?-No longer on Telemetry ? ? ? ? ?Elevated liver enzymes ?-Etiology likely secondary to sepsis from aspiration pneumonia as above.  Acute hepatitis panel negative.  RUQ ultrasound unremarkable. ?--Continue monitor LFTs intermittently but they have resolved and AST was 38 and ALT was 31 on last check  ? ?Hyponatremia ?-Etiology likely secondary to dehydration.  Urine sodium low.  Continue tube feeds; now cleared by speech therapy for dysphagia 3 diet. ?-Na+  Went from 136 -> 130 -> 132 x2 on the last few checks  ?-  Continue to Monitor and Trend intermittently  ? ?Tobacco use disorder ?-Encouraged cessation. ?-Nicotine patch; decreased to 7 mg on 3/15 ? ?Physical deconditioning ?-Significant weakness in all extremities as a result of cervical  myelopathy and acute illness.  Slowly improving while inpatient. ?-Continue PT/OT efforts, will need SNF placement; difficult placement per TOC  ? ?Urinary retention ?Foley catheter discontinued on 08/09/2021 with no current signs of u

## 2021-09-06 DIAGNOSIS — G959 Disease of spinal cord, unspecified: Secondary | ICD-10-CM | POA: Diagnosis not present

## 2021-09-06 LAB — CBC
HCT: 28.7 % — ABNORMAL LOW (ref 39.0–52.0)
Hemoglobin: 9 g/dL — ABNORMAL LOW (ref 13.0–17.0)
MCH: 29.7 pg (ref 26.0–34.0)
MCHC: 31.4 g/dL (ref 30.0–36.0)
MCV: 94.7 fL (ref 80.0–100.0)
Platelets: 399 10*3/uL (ref 150–400)
RBC: 3.03 MIL/uL — ABNORMAL LOW (ref 4.22–5.81)
RDW: 16.8 % — ABNORMAL HIGH (ref 11.5–15.5)
WBC: 5.5 10*3/uL (ref 4.0–10.5)
nRBC: 0 % (ref 0.0–0.2)

## 2021-09-06 LAB — URINALYSIS, ROUTINE W REFLEX MICROSCOPIC
Bilirubin Urine: NEGATIVE
Glucose, UA: NEGATIVE mg/dL
Hgb urine dipstick: NEGATIVE
Ketones, ur: NEGATIVE mg/dL
Nitrite: POSITIVE — AB
Protein, ur: NEGATIVE mg/dL
Specific Gravity, Urine: 1.014 (ref 1.005–1.030)
WBC, UA: >50 WBC/hpf — ABNORMAL HIGH (ref 0–5)
pH: 6 (ref 5.0–8.0)

## 2021-09-06 LAB — GLUCOSE, CAPILLARY
Glucose-Capillary: 108 mg/dL — ABNORMAL HIGH (ref 70–99)
Glucose-Capillary: 108 mg/dL — ABNORMAL HIGH (ref 70–99)
Glucose-Capillary: 109 mg/dL — ABNORMAL HIGH (ref 70–99)
Glucose-Capillary: 110 mg/dL — ABNORMAL HIGH (ref 70–99)
Glucose-Capillary: 111 mg/dL — ABNORMAL HIGH (ref 70–99)
Glucose-Capillary: 112 mg/dL — ABNORMAL HIGH (ref 70–99)
Glucose-Capillary: 126 mg/dL — ABNORMAL HIGH (ref 70–99)

## 2021-09-06 LAB — MAGNESIUM: Magnesium: 1.7 mg/dL (ref 1.7–2.4)

## 2021-09-06 LAB — BASIC METABOLIC PANEL
Anion gap: 5 (ref 5–15)
BUN: 27 mg/dL — ABNORMAL HIGH (ref 6–20)
CO2: 26 mmol/L (ref 22–32)
Calcium: 10.1 mg/dL (ref 8.9–10.3)
Chloride: 103 mmol/L (ref 98–111)
Creatinine, Ser: 0.68 mg/dL (ref 0.61–1.24)
GFR, Estimated: >60 mL/min (ref >60)
Glucose, Bld: 94 mg/dL (ref 70–99)
Potassium: 4.7 mmol/L (ref 3.5–5.1)
Sodium: 134 mmol/L — ABNORMAL LOW (ref 135–145)

## 2021-09-06 NOTE — Progress Notes (Signed)
Patient ID: Brian [name], male   DOB: 23-Jan-1961, 61 y.o.   MRN: [id number] ?Patient neurologically stable and improving. ? ?Well over antigravity upper and lower extremities.  ? ? If physical therapy thanks patient is stable to be discharged home with home health physical occupational and speech therapy patient is cleared from a neurosurgical perspective to be discharged. ?

## 2021-09-06 NOTE — Progress Notes (Signed)
Physical Therapy Treatment ?Patient Details ?Name: Brian [name] ?MRN: [id number] ?DOB: 1960-08-18 ?Today's Date: 09/06/2021 ? ? ?History of Present Illness Pt is a 61 y/o male admitted 06/29/21 following C3-7 ACDF after progressive cervical myelopathy symptoms including ataxia, bil UE and LE shaking and weakness, and loss of function in his hands with subsequent fall. Pt initially had improvement in neurological symptoms, but postoperative course was complicated by dysphasia and ataxia secondary to and epidural hematoma with evacuation of hematoma on 2/7. Course complicated by delirium secondary to encephalopathy, sepsis secondary to aspiration PNA, reintubated 2/19-2/26 for airway protection, bilat segmental PEs 2/18. PEG placed 3/6. PMHx includes tobacco and EtOH use disorder. ? ?  ?PT Comments  ? ? Continued work on Cytogeneticist, transfer ability, wheelchair training, and gait trials. The patient was able to transfer from bed to w/c min A, requiring SPT assist to boost in chair during transfer to prevent him from sliding on the floor.  He was able to self propel his w/c with his LE, using UE occasionally to aide in steering, while requiring min A once fatigued to prevent drifting. During the gait trial he shows improved endurance and required min trunk support, and min-mod LE control secondary to ataxia. He walked a total of 116 feet during two trials. His hip abductors were primed before repeat STS training and he wore a yellow band to aide in further hip abd recruitment, performing 2x10 with min A hip and LE support. Pt would benefit from continued PT services to maximize independence for functional tranfers, w/c training, and gait training.  Plan and d/c recommendations remain unchanged. PT to follow up acutely as able.  ?  ?Recommendations for follow up therapy are one component of a multi-disciplinary discharge planning process, led by the attending physician.  Recommendations may be updated  based on patient status, additional functional criteria and insurance authorization. ? ?Follow Up Recommendations ? Skilled nursing-short term rehab (<3 hours/day) ?  ?  ?Assistance Recommended at Discharge Frequent or constant Supervision/Assistance  ?Patient can return home with the following Two people to help with walking and/or transfers;A lot of help with bathing/dressing/bathroom;Assistance with cooking/housework;Assistance with feeding;Direct supervision/assist for medications management;Direct supervision/assist for financial management;Assist for transportation;Help with stairs or ramp for entrance ?  ?Equipment Recommendations ? Wheelchair (measurements PT);Wheelchair cushion (measurements PT);Hospital bed  ?  ?Recommendations for Other Services Rehab consult ? ? ?  ?Precautions / Restrictions Precautions ?Precautions: Fall ?Restrictions ?Weight Bearing Restrictions: No  ?  ? ?Mobility ? Bed Mobility ?Overal bed mobility: Needs Assistance ?Bed Mobility: Supine to Sit ?Rolling: Supervision ?  ?  ?  ?  ?General bed mobility comments: Supervision for saftey ?  ? ?Transfers ?  ?  ?Transfers: Bed to chair/wheelchair/BSC, Sit to/from Stand ?Sit to Stand: Min assist (x 25) ?  ?Step pivot transfers: Min assist, +2 physical assistance, +2 safety/equipment (w/c to recliner) ?  ?  ?  ?General transfer comment: Min A during STS for powerup, hip and glute facilitation, quad facilitation, trunk support. Min A +2 for step pivot for stand, trunk support and stepping. ?  ? ?Ambulation/Gait ?Ambulation/Gait assistance: Mod assist, +2 safety/equipment ?Gait Distance (Feet): 116 Feet (56x1 60x1) ?Assistive device: 1 person hand held assist [employer] on opposite side) ?Gait Pattern/deviations: Decreased stride length, Step-to pattern, Ataxic, Decreased dorsiflexion - left, Decreased dorsiflexion - right, Scissoring, Trunk flexed, Decreased step length - right, Decreased step length - left, Narrow base of support ?Gait velocity:  Decreased ?  ?  ?General  Gait Details: Min A for power up during STS, min A for truncal and hip support. Min-mod A for LE management, L LE with significant toe drag when fatigued. Correction of scissoring and cues for wide BOS. ? ? ?Stairs ?  ?  ?  ?  ?  ? ? ?Wheelchair Mobility ?Wheelchair Mobility ?Wheelchair parts: Needs assistance ?Distance: 200 ft ?Wheelchair Assistance Details (indicate cue type and reason): Able to propel from room to main door, then from rehab hallway back into room. Requires intermittent min A for steering and propulsion. Able to use LE mainly and hands to help stear. ? ?Modified Rankin (Stroke Patients Only) ?  ? ? ?  ?Balance Overall balance assessment: Needs assistance ?Sitting-balance support: No upper extremity supported, Feet supported ?Sitting balance-Leahy Scale: Fair ?Sitting balance - Comments: Able to sit in w/c and self propel w/o difficulty ?  ?Standing balance support: Bilateral upper extremity supported, During functional activity ?Standing balance-Leahy Scale: Poor ?Standing balance comment: Min A for trunck support, one hand on rail, 1HHA, min-modA for dynamic ?  ?  ?  ?  ?  ?  ?  ?High Level Balance Comments: Requires trunk and occasional hip and LE support. ?  ?  ?  ?  ? ?  ?Cognition Arousal/Alertness: Awake/alert ?Behavior During Therapy: [facility] for tasks assessed/performed ?Overall Cognitive Status: No family/caregiver present to determine baseline cognitive functioning ?Area of Impairment: Safety/judgement ?  ?  ?  ?  ?  ?  ?  ?  ?  ?  ?  ?  ?Safety/Judgement: Decreased awareness of safety ?  ?  ?General Comments: Pt. attempting to be independent and will try to complete tasks by himself, he waits until he has attempted multiple times before asking for help. ?  ?  ? ?  ?Exercises General Exercises - Lower Extremity ?Hip ABduction/ADduction: Strengthening, Both, Seated, 20 reps (In w/c before STS yellow band, left on for STS) ?Other Exercises ?Other Exercises: STS with  quick touch down. Pt to stand all the way up then control eccentric lower, when buttocks hit the chair he was to directly stand again. 2x10 w/ yellow band around knees, cues for hip abduction when standing. ? ?  ?General Comments General comments (skin integrity, edema, etc.): Donned shoes per pt request, shows improved grip on floor. ?  ?  ? ?Pertinent Vitals/Pain Pain Assessment ?Pain Assessment: No/denies pain  ? ? ?Home Living   ?  ?  ?  ?  ?  ?  ?  ?  ?  ?   ?  ?Prior Function    ?  ?  ?   ? ?PT Goals (current goals can now be found in the care plan section) Acute Rehab PT Goals ?Patient Stated Goal: walk in 3 weeks ?PT Goal Formulation: With patient ?Time For Goal Achievement: 09/17/21 ?Potential to Achieve Goals: Good ?Progress towards PT goals: Progressing toward goals ? ?  ?Frequency ? ? ? Min 5X/week ? ? ? ?  ?PT Plan Current plan remains appropriate  ? ? ?Co-evaluation   ?  ?  ?  ?  ? ?  ?AM-PAC PT "6 Clicks" Mobility   ?Outcome Measure ? Help needed turning from your back to your side while in a flat bed without using bedrails?: A Little ?Help needed moving from lying on your back to sitting on the side of a flat bed without using bedrails?: A Little ?Help needed moving to and from a bed to a chair (including a  wheelchair)?: A Little ?Help needed standing up from a chair using your arms (e.g., wheelchair or bedside chair)?: A Lot ?Help needed to walk in hospital room?: A Lot ?Help needed climbing 3-5 steps with a railing? : Total ?6 Click Score: 14 ? ?  ?End of Session Equipment Utilized During Treatment: Gait belt ?Activity Tolerance: Patient tolerated treatment well ?Patient left: with call bell/phone within reach;with chair alarm set;in chair ?Nurse Communication: Mobility status ?PT Visit Diagnosis: Muscle weakness (generalized) (M62.81);Other symptoms and signs involving the nervous system (R29.898);Difficulty in walking, not elsewhere classified (R26.2);Other abnormalities of gait and mobility  (R26.89) ?  ? ? ?Time: [id number] ?PT Time Calculation (min) (ACUTE ONLY): 49 min ? ?Charges:  $Gait Training: 8-22 mins ?$Therapeutic Activity: 8-22 mins ?$Neuromuscular Re-education: 8-22 mins          ?

## 2021-09-06 NOTE — Progress Notes (Signed)
Occupational Therapy Treatment ?Patient Details ?Name: Brian [name] ?MRN: [id number] ?DOB: [birth year] ?Today's Date: 09/06/2021 ? ? ?History of present illness Pt is a 61 y/o male admitted 06/29/21 following C3-7 ACDF after progressive cervical myelopathy symptoms including ataxia, bil UE and LE shaking and weakness, and loss of function in his hands with subsequent fall. Pt initially had improvement in neurological symptoms, but postoperative course was complicated by dysphasia and ataxia secondary to and epidural hematoma with evacuation of hematoma on 2/7. Course complicated by delirium secondary to encephalopathy, sepsis secondary to aspiration PNA, reintubated 2/19-2/26 for airway protection, bilat segmental PEs 2/18. PEG placed 3/6. PMHx includes tobacco and EtOH use disorder. ?  ?OT comments ? Pt making incremental progress with OT goals. This session pt and OT focused on LB dressing using AE. Pt practiced using sock aide, which increased pt's efficiency with donning socks. He used a Secondary school teacher to [employer] socks and donn pants (pillow case)- he was unable t doff socks, however he was able to donn pants with increased time. Then pt attempted to donn boots with long handled shoe horn, pt had a lot of difficulty positioning his foot to slide down the shoe horn due to weak ankles. Requiring max assist to don them. Continuing to recommend SNF, OT will continue to follow acutely.   ? ?Recommendations for follow up therapy are one component of a multi-disciplinary discharge planning process, led by the attending physician.  Recommendations may be updated based on patient status, additional functional criteria and insurance authorization. ?   ?Follow Up Recommendations ? Skilled nursing-short term rehab (<3 hours/day)  ?  ?Assistance Recommended at Discharge Frequent or constant Supervision/Assistance  ?Patient can return home with the following ? Two people to help with walking and/or transfers;Direct supervision/assist  for medications management;Assist for transportation;Help with stairs or ramp for entrance;Assistance with feeding;Two people to help with bathing/dressing/bathroom;Assistance with cooking/housework;Direct supervision/assist for financial management ?  ?Equipment Recommendations ? Wheelchair (measurements OT);Wheelchair cushion (measurements OT);Hospital bed;BSC/3in1;Tub/shower seat  ?  ?Recommendations for Other Services   ? ?  ?Precautions / Restrictions Precautions ?Precautions: Fall ?Precaution Booklet Issued: No ?Precaution Comments: Verbally reviewed precautions  ? ? ?  ? ?Mobility Bed Mobility ?  ?  ?  ?  ?  ?  ?  ?General bed mobility comments: Up in recliner ?  ? ?Transfers ?  ?  ?  ?  ?  ?  ?  ?  ?  ?General transfer comment: Stayed in recliner this session for LB dressing ?  ?  ?Balance Overall balance assessment: Needs assistance ?Sitting-balance support: No upper extremity supported, Feet supported ?Sitting balance-Leahy Scale: Fair ?Sitting balance - Comments: Pt had a fall from recliner prior to session due to reaching down to pick up his glasses ?  ?  ?  ?  ?  ?  ?  ?  ?  ?  ?  ?  ?  ?  ?  ?   ? ?ADL either performed or assessed with clinical judgement  ? ?ADL Overall ADL's : Needs assistance/impaired ?  ?  ?  ?  ?  ?  ?  ?  ?  ?  ?Lower Body Dressing: Moderate assistance;Cueing for compensatory techniques;Sitting/lateral leans ?Lower Body Dressing Details (indicate cue type and reason): Pt worked on Acupuncturist, reacher, and long handled shoe horn for donning and doffing pants (pillow case this session), socks, and shoes ?  ?  ?  ?  ?  ?  ?  ?  General ADL Comments: Session focused on education for LB dressing with AE ?  ? ?Extremity/Trunk Assessment   ?  ?  ?  ?  ?  ? ?Vision   ?  ?  ?Perception   ?  ?Praxis   ?  ? ?Cognition Arousal/Alertness: Awake/alert ?Behavior During Therapy: [facility] for tasks assessed/performed ?Overall Cognitive Status: No family/caregiver present to determine baseline  cognitive functioning ?  ?  ?  ?  ?  ?  ?  ?  ?  ?  ?  ?  ?  ?  ?  ?  ?General Comments: Pt. attempting to be independent and will try to complete tasks by himself, he waits until he has attempted multiple times before asking for help. ?  ?  ?   ?Exercises Exercises: Other exercises ?Other Exercises ?Other Exercises: Reaching forward in sitting to pick things up x5 ?Other Exercises: squeezing reacher x5 ? ?  ?Shoulder Instructions   ? ? ?  ?General Comments VSS on RA  ? ? ?Pertinent Vitals/ Pain       Pain Assessment ?Pain Assessment: No/denies pain ? ?Home Living   ?  ?  ?  ?  ?  ?  ?  ?  ?  ?  ?  ?  ?  ?  ?  ?  ?  ?  ? ?  ?Prior Functioning/Environment    ?  ?  ?  ?   ? ?Frequency ? Min 2X/week  ? ? ? ? ?  ?Progress Toward Goals ? ?OT Goals(current goals can now be found in the care plan section) ? Progress towards OT goals: Progressing toward goals ? ?Acute Rehab OT Goals ?Patient Stated Goal: To be more independent ?OT Goal Formulation: With patient ?Time For Goal Achievement: 09/19/21 ?Potential to Achieve Goals: Fair ?ADL Goals ?Pt Will Perform Eating: with min assist;with assist to don/doff brace/orthosis;with adaptive utensils;sitting ?Pt Will Perform Grooming: sitting;with min assist ?Pt Will Perform Upper Body Bathing: with mod assist;sitting ?Pt Will Transfer to Toilet: with min assist;stand pivot transfer;bedside commode ?Pt/caregiver will Perform Home Exercise Program: Increased ROM;Increased strength;Both right and left upper extremity;With theraputty;With minimal assist;With written HEP provided ?Additional ADL Goal #1: Pt will grasp objects with R hand and bring to mouth 10/10 trials in prep for self feeding ?Additional ADL Goal #2: Pt will grasp/release 5/5 objects with L hand to increase indep with ADLs  ?Plan Discharge plan remains appropriate   ? ?Co-evaluation ? ? ?   ?  ?  ?  ?  ? ?  ?AM-PAC OT "6 Clicks" Daily Activity     ?Outcome Measure ? ? Help from another person eating meals?: A  Lot ?Help from another person taking care of personal grooming?: A Lot ?Help from another person toileting, which includes using toliet, bedpan, or urinal?: A Lot ?Help from another person bathing (including washing, rinsing, drying)?: A Lot ?Help from another person to put on and taking off regular upper body clothing?: A Lot ?Help from another person to put on and taking off regular lower body clothing?: A Lot ?6 Click Score: 12 ? ?  ?End of Session   ? ?OT Visit Diagnosis: Other abnormalities of gait and mobility (R26.89);Muscle weakness (generalized) (M62.81);Feeding difficulties (R63.3) ?  ?Activity Tolerance Patient tolerated treatment well ?  ?Patient Left in chair;with call bell/phone within reach;with chair alarm set ?  ?Nurse Communication Mobility status ?  ? ?   ? ?Time: [id number] ?OT Time Calculation (min): 30  min ? ?Charges: OT General Charges ?$OT Visit: 1 Visit ?OT Treatments ?$Self Care/Home Management : 23-37 mins ? ?Ellieana Dolecki H., OTR/L ?Acute Rehabilitation ? ?Emaree Chiu Elane Yolanda Bonine ?09/06/2021, 6:08 PM ?

## 2021-09-06 NOTE — Progress Notes (Signed)
?   09/06/21 1538  ?What Happened  ?Was fall witnessed? No  ?Was patient injured? No  ?Patient found on floor  ?Found by Staff-comment ?(PT)  ?Stated prior activity other (comment) ?(sitting in chair and tried to reach glasses that fell on the floor, call bell within reach)  ?Follow Up  ?MD notified Dr. Erlinda Hong  ?Time MD notified 1539  ?Family notified Yes - comment ?(left voicemail for son)  ?Time family notified 1541  ?Additional tests No  ?Adult Fall Risk Assessment  ?Risk Factor Category (scoring not indicated) Fall has occurred during this admission (document High fall risk)  ?Patient Fall Risk Level High fall risk  ?Adult Fall Risk Interventions  ?Required Bundle Interventions *See Row Information* High fall risk - low, moderate, and high requirements implemented  ?Additional Interventions Use of appropriate toileting equipment (bedpan, BSC, etc.)  ?Screening for Fall Injury Risk (To be completed on HIGH fall risk patients) - Assessing Need for Floor Mats  ?Risk For Fall Injury- Criteria for Floor Mats Previous fall this admission  ?Will Implement Floor Mats Yes  ?Pain Assessment  ?Pain Scale 0-10  ?Pain Score 0  ?Neurological  ?Neuro (WDL) X  ?Level of Consciousness Alert  ?Orientation Level Oriented X4  ?Cognition Follows commands;Appropriate at baseline  ?Speech Clear  ?R Pupil Size (mm) 3  ?R Pupil Shape Round  ?R Pupil Reaction Brisk  ?L Pupil Size (mm) 3  ?L Pupil Shape Round  ?L Pupil Reaction Brisk  ?RUE Motor Response Purposeful movement  ?RUE Sensation Full sensation  ?RUE Motor Strength 4  ?LUE Motor Response Purposeful movement  ?LUE Sensation Full sensation  ?LUE Motor Strength 4  ?RLE Motor Response Purposeful movement  ?RLE Sensation Full sensation  ?RLE Motor Strength 4  ?LLE Motor Response Purposeful movement  ?LLE Sensation Full sensation  ?LLE Motor Strength 4  ?Neuro Symptoms Forgetful  ?Glasgow Coma Scale  ?Eye Opening 4  ?Best Verbal Response (NON-intubated) 5  ?Best Motor Response 6  ?Gl[place]  Coma Scale Score 15  ?Musculoskeletal  ?Musculoskeletal (WDL) X  ?Assistive Device Front wheel walker  ?Generalized Weakness Yes  ?Musculoskeletal Details  ?RUE Other (Comment) ?(stiff)  ?LUE Other (Comment) ?(stiff)  ?RLE Other (Comment) ?(stiff)  ?LLE Other (Comment) ?(stiff)  ?Integumentary  ?Integumentary (WDL) X  ?Skin Color Appropriate for ethnicity  ?Skin Condition Flaky ?(over majority of body, most severe on hands)  ?Skin Integrity Intact  ? ? ? ? 09/06/21 1518  ?Vitals  ?Temp 97.7 ?F (36.5 ?C)  ?Temp Source Oral  ?BP 121/80  ?MAP (mmHg) 95  ?BP Location Left Arm  ?BP Method Automatic  ?Patient Position (if appropriate) Sitting  ?Pulse Rate 97  ?Pulse Rate Source [place]  ?Resp 17  ? ?

## 2021-09-06 NOTE — Progress Notes (Signed)
?Medical consultation PROGRESS NOTE ? ? ? ?AMAYA BADERTSCHER  [id number] DOB: [birth year] DOA: 06/29/2021 ?PCP: Pcp, No  ? ? ? ?Brief Narrative:  ?YOSSI DONZE is a 61 year old male with past medical history significant for tobacco and EtOH use disorder who was admitted by neurosurgery for cervical myelopathy due to critical multilevel cervical spinal canal stenosis C3-6 with spinal cord compression and spinal cord signal change after recent fall with progressive upper and lower extremity weakness and tremors.  He was admitted on 2/3 and underwent ACDF of C3-4, C4-5, C5-5, and C6-7.  Post-operatively he was progressing slowly with residual weakness in both hands but improving lower extremity strength and function.  He developed some difficulty swallowing on 2/5 and SLP ordered and placed on thickened liquids.   Overnight 2/6, patient with increased difficulty swallowing and was made NPO but also noted to have dysarthria and difficulty moving extremities with numbness.  SLP evaluated and found to be moderate aspiration risk.   Also progressively tachycardic, tachypneic, and now febrile.  Code stroke activated and taken for CT/ CTA head and neck and was given decadron 10mg  once.  NIHSS 18.  CTH was negative for acute findings, and CTA head neck did not reveal any LVO but noted significant prevertebral soft tissue swelling with foci of gas present at the ventral epidural space at C4-5, small collection not excluded. On 2/7, patient with worsening confusion and concern for airway involvement, PCCM consulted and remained in the intensive care unit until he is transferred to the floor on 3/2 and PCCM requested transition to [facility] for medical assistance while patient remains under the neurosurgery service. ? ?Significant Hospital Events: ?2/3 ACDF C3-4, C4-5, C5-5, and C6-7 w/ Dr. Annette Stable ?2/6 SLP eval for difficulty swallowing ?2/7 Code stroke overnight, neg for LVO, CT showing soft tissue swelling.  PCCM consulted for  concern of airway management and AMS.  Went for evacuation of hematoma  ?2/18 CT Abd/Pelvis: showing bilateral segmental Pes, started on heparin and showing worsening pneumonia, febrile to  103.  PCT rising, 34.6, restarted on abx ?2/19 possible aspiration w/ severe hypoxia; intubated; Switching from heparin to angiomax given subtherapeutic levels despite increase in rate. ?2/20 Echo shows normal LV systolic function. There was notation of a + McConnell's sign c/w a large pulmonary embolus which is consistent with the finding of bilateral segmental pulmonary emboli noted on CT 07/14/2021. ?2/21 rash appreciated on back; stopped cefepim/vanc switched to zosyn; required low dose levo with increase in fentanyl ?2/22 remains intubated; unable to extubate to do increase RR and thick secretions ?2/23 Ketamine infusion added ?2/24: weaning fentanyl.  ?2/26 extubated ?2/28 tachycardia persists ?3/2: Modified barium swallow, continues n.p.o. with core track in place, likely will need PEG tube. ?3/6: s/p IR G-tube placement ?3/16: Foley catheter discontinued ?3/24: E. coli UTI/bacteremia>> cefazolin x7 days per ID ?Now medically clear awaiting SNF placement still but per TOC has been difficult to place. ?4/5-4/6 Had a fall overnight and fell out of the bed and was unwitnessed but patient states he rolled out of the bed and did not get hurt. Has fall precautions now. ? ? ? ?Subjective: ? ?Bp has improved, he is sitting up in chair, denies any new complaints ?Urine is cloudy ? ?He is on dysphagia diet, nutrition per tube at night and getting  free water flushes  ?Rn reports he is aaox3, but seems to have  intermittent confusion by reports ? ?Assessment & Plan: ? Principal Problem: ?  Cervical myelopathy ([place]);  severe cervical spinal stenosis with cord compression s/p ACDF [id number] on 2/3, complicated by epidural hematoma s/p evacuation 2/7 ?Active Problems: ?  Acute respiratory failure with hypoxia ([place]) ?  Acute pulmonary embolism  ([place]) ?  Acute metabolic encephalopathy ?  Severe sepsis due to recurrent aspiration pneumonia ?  E. coli bacteremia ?  Sinus tachycardia ?  Elevated liver enzymes ?  Hyponatremia ?  Tobacco use disorder ?  Physical deconditioning ?  Urinary retention ?  Protein-calorie malnutrition, severe ([place]) ?  Lumbar back pain ?  Fall during current hospitalization ?  Normocytic anemia ?  Thrombocytosis ? ? ? ?Assessment and Plan: ? ?* Cervical myelopathy ([facility]); severe cervical spinal stenosis with cord compression s/p ACDF [id number] on 2/3, complicated by epidural hematoma s/p evacuation 2/7 ?Patient initially presented s/p recent fall with progressive upper and lower extremity weakness and tremors, He was found to have critical multilevel cervical spinal stenosis @ C3-6 with spinal cord compression and spinal cord signal change and underwent ACDF C3-4, C4-5, C5-5, and C6/7 by neurosurgery Dr. Trenton Gammon on 06/29/2021.  Postoperative leg complicated by epidural hematoma s/p evacuation on 2/7. ?-Continue PT/OT/SLP efforts, Awaiting SNF still but if improves enough maybe able to go Home with Home Health if continues to improve ?-Further as per neurosurgery recommendation. ? ?Acute respiratory failure with hypoxia ([place]) ?-Likely Multifactorial with significant dysphagia and recurrent aspiration pneumonia events during initial hospitalization following ACDF surgery with postoperative complications of postoperative cervical hematoma.   ?-Patient did require ventilatory support in the intensive care unit and was successfully extubated on 07/22/2021.   ?-Patient completed extensive course of empiric antibiotics.  Oxygen now weaned off, Now on room air. ?-SpO2: 100 % ?O2 Flow Rate (L/min): 0 L/min ?FiO2 (%): 40 % ; No longer on Supplemental O2 via [place] ?-Continue Aspiration precautions ? ? ? ? ?Acute pulmonary embolism ([place]) ?-CTA chest showed bilateral segmental PE.   ?-LE [place] negative for DVT.  TTE with normal LV systolic function.  Supplemental  oxygen weaned off. ?-C/w Eliquis 5mg  BID ? ?Acute metabolic encephalopathy ?-CT head, MRI brain, EEG, TSH, B12, ammonia unrevealing.  ?-Etiology likely multifactorial with acute respiratory failure secondary to aspiration pneumonia, bilateral pulmonary embolism.  -Completed course of antibiotics for aspiration pneumonia.  ?-Seroquel and clonazepam discontinued.  -Completed course of antibiotics for E. coli UTI/septicemia ? ?E. coli bacteremia ?-Urine culture and blood cultures x2 08/17/2028 positive for E. coli.   ?-Evaluated by infectious disease.   ?-Completed 7-day course of antibiotics per ID. ? ?Severe sepsis due to recurrent aspiration pneumonia ?-Likely recurrent issue during hospitalization, remains n.p.o. due to continued aspiration risk per SLP.   ?-Completed extensive course of antibiotics while under ICU care.  ?-S/p IR gtube 3/6. ?-Continue tube feeds nocturnally  ?-Cleared for dysphagia 3 mechanical soft diet with thin liquids per SLP; but with very little oral intake per RN; and patient with little desire for oral intake ?-Continue aspiration precautions ?-Continue SLP efforts while inpatient ? ?Sinus tachycardia ?-Multifactorial including sepsis, dehydration, PE, agitation.   ?-Continue Metoprolol 100 mg twice daily ?-No longer on Telemetry ? ? ? ? ?Elevated liver enzymes ?-Etiology likely secondary to sepsis from aspiration pneumonia as above.  Acute hepatitis panel negative.  RUQ ultrasound unremarkable. ?--Continue monitor LFTs intermittently but they have resolved and AST was 38 and ALT was 31 on last check  ? ?Hyponatremia ?-Etiology likely secondary to dehydration.  Urine sodium low.  Continue tube feeds; now cleared by speech therapy for dysphagia 3 diet. ?-  Na+  Went from 136 -> 130 -> 132 x2 on the last few checks  ?-Continue to Monitor and Trend intermittently  ? ?Tobacco use disorder ?-Encouraged cessation. ?-Nicotine patch; decreased to 7 mg on 3/15 ? ?Physical deconditioning ?-Significant  weakness in all extremities as a result of cervical myelopathy and acute illness.  Slowly improving while inpatient. ?-Continue PT/OT efforts, will need SNF placement; difficult placement per TOC  ? ?[place]

## 2021-09-06 NOTE — Progress Notes (Addendum)
Speech Language Pathology Treatment: Dysphagia  ?Patient Details ?Name: Brian [name] ?MRN: [id number] ?DOB: April 19, 1961 ?Today's Date: 09/06/2021 ?Time: [id number] ?SLP Time Calculation (min) (ACUTE ONLY): 23 min ? ?Assessment / Plan / Recommendation ?Clinical Impression ? Pt sitting in chair, smiling and ready to work on his swallowing. He completed 5 cycles of 10 reps with expiratory muscle strength trainer lite device increased to approximately 20 cm H2o with a 6/10 exertion rate. Therapist had increased to 25 however this proved excessive and was lowered to 20. Encouraged to work with respiratory trainer as often as possible. When therapist returned from getting soda to thicken, pt using breather.  ?He consumed graham cracker with peanut butter with functional oral prep and transit without s/sx aspiration and nectar thick soda was unremarkable. He was able to hold cup and bring to oral cavity independently today. Therapist assisted pt in ordering a ham sandwich for supper as he states he ate a sandwich most days when he was working. It has been 20 days since pt's previous MBS and therapist will re-assess swallow function with MBS tomorrow for possible upgrade to thin liquids.   ?  ?HPI HPI: Pt is a 61 y/o admitted 06/29/21 following C3-7 ACDF after progressive cervical myelopathy symptoms with subsequent fall and severe incomplete SCI. Significant prevertebral swelling per imaging. Pt developed some difficulty swallowing on 2/5 and SLP consulted. MBS 2/6 revealed severe edema s/p ACDF, oral holding, a pharyngeal delay, inconsistent hyolaryngeal elevation, incomplete epiglottic inversion due to edema. Aspiration noted accross consistencies and and NPO status was recommended. Repeat MBS 2/10 showed significantly reduced pharyngeal edema and a dysphagia 2 diet with nectar thick liquids was initiated. Pt transitioned to thin liquids 2/16 and susbequently returned to nectar thick 2/17 due to concern for aspiration. Pt  developed pna, possible aspiration, requiring intubation 2/19-26. Cortrak placed 2/20. PEG placed in IR on 3/6. No significant PMH. This is pt's 3rd MBS ?  ?   ?SLP Plan ? MBS ? ?  ?  ?Recommendations for follow up therapy are one component of a multi-disciplinary discharge planning process, led by the attending physician.  Recommendations may be updated based on patient status, additional functional criteria and insurance authorization. ?  ? ?Recommendations  ?Diet recommendations: Regular;Nectar-thick liquid ?Liquids provided via: Cup;Straw ?Medication Administration: Whole meds with puree ?Supervision: Staff to assist with self feeding (pt can hold styrofoam cup to drink) ?Compensations: Slow rate;Small sips/bites ?Postural Changes and/or Swallow Maneuvers: Seated upright 90 degrees  ?   ?    ?   ? ? ? ? Oral Care Recommendations: Oral care BID ?Follow Up Recommendations: Skilled nursing-short term rehab (<3 hours/day) ?Assistance recommended at discharge: Intermittent Supervision/Assistance ?SLP Visit Diagnosis: Dysphagia, oropharyngeal phase (R13.12) ?Plan: MBS ? ? ? ? ?  ?  ? ? ?Houston Siren ? ?09/06/2021, 2:56 PM ?

## 2021-09-07 ENCOUNTER — Inpatient Hospital Stay (HOSPITAL_COMMUNITY): Payer: 59

## 2021-09-07 DIAGNOSIS — G959 Disease of spinal cord, unspecified: Secondary | ICD-10-CM | POA: Diagnosis not present

## 2021-09-07 LAB — GLUCOSE, CAPILLARY
Glucose-Capillary: 116 mg/dL — ABNORMAL HIGH (ref 70–99)
Glucose-Capillary: 117 mg/dL — ABNORMAL HIGH (ref 70–99)
Glucose-Capillary: 117 mg/dL — ABNORMAL HIGH (ref 70–99)
Glucose-Capillary: 120 mg/dL — ABNORMAL HIGH (ref 70–99)
Glucose-Capillary: 120 mg/dL — ABNORMAL HIGH (ref 70–99)
Glucose-Capillary: 122 mg/dL — ABNORMAL HIGH (ref 70–99)

## 2021-09-07 MED ORDER — SODIUM CHLORIDE 0.9 % IV SOLN
1.0000 g | INTRAVENOUS | Status: DC
  Administered 2021-09-07 – 2021-09-09 (×3): 1 g via INTRAVENOUS
  Filled 2021-09-07 (×3): qty 10

## 2021-09-07 NOTE — Progress Notes (Signed)
Physical Therapy Treatment ?Patient Details ?Name: Brian [name] ?MRN: [id number] ?DOB: [birth year] ?Today's Date: 09/07/2021 ? ? ?History of Present Illness Pt is a 61 y/o male admitted 06/29/21 following C3-7 ACDF after progressive cervical myelopathy symptoms including ataxia, bil UE and LE shaking and weakness, and loss of function in his hands with subsequent fall. Pt initially had improvement in neurological symptoms, but postoperative course was complicated by dysphasia and ataxia secondary to and epidural hematoma with evacuation of hematoma on 2/7. Course complicated by delirium secondary to encephalopathy, sepsis secondary to aspiration PNA, reintubated 2/19-2/26 for airway protection, bilat segmental PEs 2/18. PEG placed 3/6. PMHx includes tobacco and EtOH use disorder. ? ?  ?PT Comments  ? ?  ?Focus of session today was functional transfers, w/c mobility, standing balance, and gait trials. Pt continues to show improved LE function during all transfers and gait trials. He has limited coordination and functional endurance that continues to limit his function, he is also unaware when his knees are going to buckle and requiring a close chair follow. His overall UE function still limits his ability to perform ALDs, w/c propulsion, and RW management. Pt would benefit from continued PT services to maximize independence for functional transfer ability, standing and dynamic balance, w/c ability, and gait. Plan and d/c recommendations remain unchanged. PT to follow up acutely as able.  ?   ?Recommendations for follow up therapy are one component of a multi-disciplinary discharge planning process, led by the attending physician.  Recommendations may be updated based on patient status, additional functional criteria and insurance authorization. ? ?Follow Up Recommendations ? Skilled nursing-short term rehab (<3 hours/day) ?  ?  ?Assistance Recommended at Discharge Frequent or constant Supervision/Assistance  ?Patient  can return home with the following Two people to help with walking and/or transfers;A lot of help with bathing/dressing/bathroom;Assistance with cooking/housework;Assistance with feeding;Direct supervision/assist for medications management;Direct supervision/assist for financial management;Assist for transportation;Help with stairs or ramp for entrance ?  ?Equipment Recommendations ? Wheelchair (measurements PT);Wheelchair cushion (measurements PT);Hospital bed;Rolling walker (2 wheels)  ?  ?Recommendations for Other Services Rehab consult ? ? ?  ?Precautions / Restrictions Precautions ?Precautions: Fall ?Restrictions ?Weight Bearing Restrictions: No  ?  ? ?Mobility ? Bed Mobility ?Overal bed mobility: Needs Assistance ?Bed Mobility: Supine to Sit, Sit to Supine ?  ?  ?Supine to sit: Supervision, HOB elevated ?Sit to supine: Min guard, HOB elevated ?  ?General bed mobility comments: Supervision for saftey and min guard for saftey and management of bed. Total for donning of shoes. ?  ? ?Transfers ?Overall transfer level: Needs assistance ?Equipment used: Rolling walker (2 wheels) ?Transfers: Sit to/from Stand, Bed to chair/wheelchair/BSC ?Sit to Stand: Min assist, +2 safety/equipment (x4) ?Stand pivot transfers: Mod assist (stand pivot HHA) ?  ?  ?  ? Lateral/Scoot Transfers: Min guard (Min Guard to the L) ?General transfer comment: Cues for hand placement on RW for STS, min A for power up and steady, Min A for RW management. Stand pivot required A to stand, steady, and pivot +1 ?  ? ?Ambulation/Gait ?Ambulation/Gait assistance: Mod assist, +2 safety/equipment ?Gait Distance (Feet): 150 Feet (Broken into 3 bouts (40, 50,60)) ?Assistive device: Rolling walker (2 wheels) ?Gait Pattern/deviations: Decreased stride length, Step-to pattern, Ataxic, Decreased dorsiflexion - left, Decreased dorsiflexion - right, Scissoring, Trunk flexed, Decreased step length - right, Decreased step length - left, Narrow base of support ?   ?  ?  ?General Gait Details: Min-mod A for RW management, min-mod A for  trunk support depending on fatigue level. Pt was able to perform a turn and then continue walking afterwards. Significant cuing for foot placement, wide BOS, and upright trunk. ? ? ?Stairs ?  ?  ?  ?  ?  ? ? ?Wheelchair Mobility ?Wheelchair Mobility ?Wheelchair mobility: Yes ?Wheelchair propulsion: Both lower extermities ?Wheelchair parts: Needs assistance ?Distance: 75 ft ?Wheelchair Assistance Details (indicate cue type and reason): Uses LE to propel w/c, R LE getting stuck today and requires min A during propulsion for directional corrections and boost. ? ?Modified Rankin (Stroke Patients Only) ?  ? ? ?  ?Balance Overall balance assessment: Needs assistance ?Sitting-balance support: No upper extremity supported, Feet supported ?Sitting balance-Leahy Scale: Fair ?Sitting balance - Comments: Pt. able to reach around w/o difficulty, only minimally out of BOS and correct trunk position. ?  ?  ?Standing balance-Leahy Scale: Poor ?Standing balance comment: Requires RW and trunk support min A for all standing, up to Mod A for dynamic ?  ?  ?  ?  ?  ?  ?  ?  ?  ?  ?  ?  ? ?  ?Cognition Arousal/Alertness: Awake/alert ?Behavior During Therapy: [facility] for tasks assessed/performed ?Overall Cognitive Status: No family/caregiver present to determine baseline cognitive functioning ?Area of Impairment: Safety/judgement ?  ?  ?  ?  ?  ?  ?  ?  ?  ?  ?  ?  ?Safety/Judgement: Decreased awareness of safety ?  ?  ?General Comments: Pt. requires close guard during functional movements, he desires to be as independent as possible. ?  ?  ? ?  ?Exercises   ? ?  ?General Comments General comments (skin integrity, edema, etc.): Donned shoes and "gloves" per pt request ?  ?  ? ?Pertinent Vitals/Pain Pain Assessment ?Pain Assessment: No/denies pain  ? ? ?Home Living   ?  ?  ?  ?  ?  ?  ?  ?  ?  ?   ?  ?Prior Function    ?  ?  ?   ? ?PT Goals (current goals can now be found  in the care plan section) Acute Rehab PT Goals ?Patient Stated Goal: walk in 3 weeks ?PT Goal Formulation: With patient ?Time For Goal Achievement: 09/17/21 ?Potential to Achieve Goals: Good ?Progress towards PT goals: Progressing toward goals ? ?  ?Frequency ? ? ? Min 5X/week ? ? ? ?  ?PT Plan Current plan remains appropriate  ? ? ?Co-evaluation   ?  ?  ?  ?  ? ?  ?AM-PAC PT "6 Clicks" Mobility   ?Outcome Measure ? Help needed turning from your back to your side while in a flat bed without using bedrails?: A Little ?Help needed moving from lying on your back to sitting on the side of a flat bed without using bedrails?: A Little ?Help needed moving to and from a bed to a chair (including a wheelchair)?: A Little ?Help needed standing up from a chair using your arms (e.g., wheelchair or bedside chair)?: A Little ?Help needed to walk in hospital room?: A Lot ?Help needed climbing 3-5 steps with a railing? : Total ?6 Click Score: 15 ? ?  ?End of Session Equipment Utilized During Treatment: Gait belt ?Activity Tolerance: Patient tolerated treatment well ?Patient left: with call bell/phone within reach;in bed;with bed alarm set ?Nurse Communication: Mobility status ?PT Visit Diagnosis: Muscle weakness (generalized) (M62.81);Other symptoms and signs involving the nervous system (R29.898);Difficulty in walking, not elsewhere classified (R26.2);Other abnormalities  of gait and mobility (R26.89) ?  ? ? ?Time: [id number] ?PT Time Calculation (min) (ACUTE ONLY): 38 min ? ?Charges:  $Gait Training: 23-37 mins ?$Therapeutic Activity: 8-22 mins          ?          ? ?Lorie ApleyJustin Helios Kohlmann, SPT ?Acute Rehab Services ? ? ? ?Lorie ApleyJustin Ayriana Wix ?09/07/2021, 2:15 PM ? ?

## 2021-09-07 NOTE — Progress Notes (Signed)
Nutrition Follow-up ? ?DOCUMENTATION CODES:  ?Severe malnutrition in context of social or environmental circumstances, Underweight ? ?INTERVENTION:  ?Continue to meet 75-100% of pt's nutrition needs via nocturnal TF via PEG: ? -Osmolite 1.5 @ 38ml/hr x 16 hours (administer from 1700-0900) ? -74ml Prosource TF BID ? -130ml free water Q4H  ? ?This provides 2120 kcals, 107 grams of protein and 1036 mL of free water (2063ml total free water with flushes) ?Meets 100% minimum estimated calorie and protein needs ? ?NUTRITION DIAGNOSIS:  ?Severe Malnutrition related to social / environmental circumstances as evidenced by severe muscle depletion, severe fat depletion. -- ongoing  ? ?GOAL:  ?Patient will meet greater than or equal to 90% of their needs -- addressing via TF ? ?MONITOR:  ?TF tolerance ? ?REASON FOR ASSESSMENT:  ?Consult ?Assessment of nutrition requirement/status, Enteral/tube feeding initiation and management ? ?ASSESSMENT:  ?Pt with hx EtOH abuse, tobacco use, and spinal stenosis initially presented 2/3 for planned multilevel anterior cervical decompression and fusion surgery after experiencing progressive bilateral upper and lower extremity weakness and spasticity due to critical multilevel cervical spinal stenosis with spinal cord compression and signal change. ? ?02/03 - s/p anterior cervical discectomy with interbody fusion  ?02/05 - pt initially complained of worsening swallowing function ?02/06 - MBS, NPO per SLP ?02/07 - Code Stroke activated (negative) but transferred to ICU, s/p re-exploration anterior cervical fusion with evacuation of epidural hematoma ?02/08 - Cortrak tube placed (tip gastric), tube feeds initiated ?02/10 - MBS, diet advanced to dysphagia 2 with nectar-thick liquids, Cortrak removed ?02/11 - NPO ?02/13 - diet advanced to dysphagia 2 with nectar-thick liquids ?02/16 - diet advanced to dysphagia 2 with thin liquids ?02/19- Intubated after desaturation, possibly from PO intake ?02/20  - s/p cortrak tube; post pyloric  ?02/22 - TF off, golytely given ?02/23 - restarted and advancing TF ?02/26 - Extubated ?03/02 - failed MBS ?03/06 - PEG placed ?03/23 - diet advanced to dysphagia 2 with nectar thick liquids s/p MBSS ?03/24 - PEG replaced ?03/25 - diet advanced to dysphagia 2 with nectar thick liquids ?03/27 - diet downgraded to dysphagia 1 with nectar thick liquids ?03/28 - diet advanced to dysphagia 3 with thin liquids ?03/30 - diet downgraded to dysphagia 3 with nectar thick liquids ? ?Pt continues to have minimal PO intake and little desire to take POs; receiving nocturnal TF via PEG w/o tolerance issues. Current TF: Osmolite 1.5 @ 7ml/hr x 16 hours  w/ 30ml Prosource TF BID and 178ml free water Q4H.  ? ?UOP: 954ml x24 hours ?I/O: +3756ml since admit ? ?Pt's weight slowly trending up -- continue current nutrition plan of care to continue weight gain (current nocturnal TF regimen providing ~40 kcals/kg and ~2g protein/kg) ? ?Admit wt 61.7 kg ?Current wt 53.1 kg ? ?Medications: folvite, mvi with minerals, thiamine, IV abx ?Labs: ?Recent Labs  ?Lab 09/04/21 ?0329 09/06/21 ?[id number]  ?NA 132* 134*  ?K 4.2 4.7  ?CL 97* 103  ?CO2 28 26  ?BUN 26* 27*  ?CREATININE 0.74 0.68  ?CALCIUM 9.9 10.1  ?MG 1.7 1.7  ?PHOS 4.5  --   ?GLUCOSE 118* 94  ?CBGs: 111-126 x 24 hours ? ?Diet Order:   ?Diet Order   ? ?       ?  Diet regular Room service appropriate? Yes; Fluid consistency: Nectar Thick  Diet effective now       ?  ? ?  ?  ? ?  ? ?EDUCATION NEEDS:  ?Education needs have been addressed ? ?  Skin:  Skin Assessment: Skin Integrity Issues: ?Skin Integrity Issues:: Other (Comment) ?Incisions: closed neck ?Other: MASD buttocks ? ?Last BM:  4/13 type 3 ? ?Height:  ?Ht Readings from Last 1 Encounters:  ?07/15/21 5\' 10"  (1.778 m)  ? ?Weight:  ?Wt Readings from Last 1 Encounters:  ?09/07/21 53.1 kg  ? ?Ideal Body Weight:  78.2 kg ? ?BMI:  Body mass index is 16.8 kg/m?. ? ?Estimated Nutritional Needs:  ?Kcal:   2100-2300 ?Protein:  105-125 grams ?Fluid:  >2L/day ? ? ? ?Theone Stanley., MS, RD, LDN (she/her/hers) ?RD pager number and weekend/on-call pager number located in [place]. ? ? ?

## 2021-09-07 NOTE — Progress Notes (Signed)
Modified Barium Swallow Progress Note ? ?Patient Details  ?Name: Brian [name] ?MRN: [id number] ?Date of Birth: 07-08-1960 ? ?Today's Date: 09/07/2021 ? ?Modified Barium Swallow completed.  Full report located under Chart Review in the Imaging Section. ? ?Brief recommendations include the following: ? ?Clinical Impression ? Pt's pharyngeal swallow has improved from prior MBS 3 weeks ago in which he needed to continue nectar thick liquids. Today there was no aspiration but trace laryngeal penetration. Oral manipulation, bolus cohesion and transit were within normal range and he cleared oral cavity effectively. His anterior hyolaryngeal elevation and epiglottic deflection were adequate. Timing of closure was minimally delayed resulting in penetration with thin that spontaneously cleared during swallow (PAS 2) and minimal amount remained in vestibule (PAS 3) and with straw penetration slightly deeper but not reaching vocal cords (PAS 3).A verbal cue to perform hard cough effectively cleared penetrate. Residue in valleculae and pyriform sinuses was trace and intermittent. The barium pill was administered with applesauce/barium mixture and lodged in valleculae requiring thin barium which assisted in clearance. Aspiration risk appears low with thin and SLP recommending liquid upgrade to thin with volitional cough throughout the meal. Continue regular texture, crush larger pills in puree but small to medium can be whole with puree. ST will continue to follow. ?  ?Swallow Evaluation Recommendations ? ?   ? ? SLP Diet Recommendations: Regular solids;Thin liquid ? ? Liquid Administration via: Cup;No straw ? ? Medication Administration: Crushed with puree ? ? Supervision: Staff to assist with self feeding;Intermittent supervision to cue for compensatory strategies ? ? Compensations: Slow rate;Small sips/bites;Other (Comment) (Cough intermittently) ? ? Postural Changes: Seated upright at 90 degrees ? ? Oral Care  Recommendations: Oral care BID ? ?   ? ? ? ?[name] ?09/07/2021,1:42 PM ?

## 2021-09-07 NOTE — Progress Notes (Signed)
Subjective: ?Patient reports doing well and ready to go home  ? ?Objective: ?Vital signs in last 24 hours: ?Temp:  [97.7 ?F (36.5 ?C)-98.1 ?F (36.7 ?C)] 98 ?F (36.7 ?C) (04/14 1243) ?Pulse Rate:  [94-108] 100 (04/14 1243) ?Resp:  [17-20] 20 (04/14 1243) ?BP: (94-121)/(72-88) 94/74 (04/14 1243) ?SpO2:  [99 %-100 %] 100 % (04/14 1243) ?Weight:  [53.1 kg] 53.1 kg (04/14 0317) ? ?Intake/Output from previous day: ?04/13 0701 - 04/14 0700 ?In: 2511.2 [NG/GT:2511.2] ?Out: 900 [Urine:900] ?Intake/Output this shift: ?Total I/O ?In: -  ?Out: 300 [Urine:300] ? ? ? ?Lab Results: ?Lab Results  ?Component Value Date  ? WBC 5.5 09/06/2021  ? HGB 9.0 (L) 09/06/2021  ? HCT 28.7 (L) 09/06/2021  ? MCV 94.7 09/06/2021  ? PLT 399 09/06/2021  ? ?No results found for: INR, PROTIME ?BMET ?Lab Results  ?Component Value Date  ? NA 134 (L) 09/06/2021  ? K 4.7 09/06/2021  ? CL 103 09/06/2021  ? CO2 26 09/06/2021  ? GLUCOSE 94 09/06/2021  ? BUN 27 (H) 09/06/2021  ? CREATININE 0.68 09/06/2021  ? CALCIUM 10.1 09/06/2021  ? ? ?Studies/Results: ?DG Swallowing Func-Speech Pathology ? ?Result Date: 09/07/2021 ?Table formatting from the original result was not included. Objective Swallowing Evaluation: Type of Study: MBS-Modified Barium Swallow Study  Patient Details Name: Brian [name] MRN: [id number] Date of Birth: 1960-12-17 Today's Date: 09/07/2021 Time: SLP Start Time (ACUTE ONLY): 1208 -SLP Stop Time (ACUTE ONLY): 1224 SLP Time Calculation (min) (ACUTE ONLY): 16 min Past Medical History: No past medical history on file. Past Surgical History: Past Surgical History: Procedure Laterality Date  ANTERIOR CERVICAL DECOMPRESSION/DISCECTOMY FUSION 4 LEVELS N/A 06/29/2021  Procedure: Anterior Cervical Discectomy Fusion - Cervical Three-Cervical Four - Cervical Four- Cervical Five - Cervical Five- Cervical Six - Cervical Six- Cervical Seven;  Surgeon: Earnie Larsson, MD;  Location: [facility];  Service: Neurosurgery;  Laterality: N/A;  HEMATOMA EVACUATION N/A  07/03/2021  Procedure: EXPLORATION OF NECK AND REMOVE BLOOD CLOT;  Surgeon: Earnie Larsson, MD;  Location: [facility];  Service: Neurosurgery;  Laterality: N/A;  IR GASTROSTOMY TUBE MOD SED  07/30/2021  IR REPLC GASTRO/COLONIC TUBE PERCUT W/FLUORO  08/17/2021 HPI: Pt is a 61 y/o admitted 06/29/21 following C3-7 ACDF after progressive cervical myelopathy symptoms with subsequent fall and severe incomplete SCI. Significant prevertebral swelling per imaging. Pt developed some difficulty swallowing on 2/5 and SLP consulted. MBS 2/6 revealed severe edema s/p ACDF, oral holding, a pharyngeal delay, inconsistent hyolaryngeal elevation, incomplete epiglottic inversion due to edema. Aspiration noted accross consistencies and and NPO status was recommended. Repeat MBS 2/10 showed significantly reduced pharyngeal edema and a dysphagia 2 diet with nectar thick liquids was initiated. Pt transitioned to thin liquids 2/16 and susbequently returned to nectar thick 2/17 due to concern for aspiration. Pt developed pna, possible aspiration, requiring intubation 2/19-26. Cortrak placed 2/20. PEG placed in IR on 3/6. No significant PMH. Pt is appropriate for his 4th MBS to determine if he can upgrade to thin liquids.  Subjective: alert, cooperative, asking if his son is out in the hall  Recommendations for follow up therapy are one component of a multi-disciplinary discharge planning process, led by the attending physician.  Recommendations may be updated based on patient status, additional functional criteria and insurance authorization. Assessment / Plan / Recommendation   09/07/2021   1:13 PM Clinical Impressions Clinical Impression Pt's pharyngeal swallow has improved from prior MBS 3 weeks ago in which he needed to continue nectar thick liquids. Today  there was no aspiration but trace laryngeal penetration. Oral manipulation, bolus cohesion and transit were within normal range and he cleared oral cavity effectively. His anterior hyolaryngeal  elevation and epiglottic deflection were adequate. Timing of closure was minimally delayed resulting in penetration with thin that spontaneously cleared during swallow (PAS 2) and minimal amount remained in vestibule (PAS 3) and with straw penetration slightly deeper but not reaching vocal cords (PAS 3).A verbal cue to perform hard cough effectively cleared penetrate. Residue in valleculae and pyriform sinuses was trace and intermittent. The barium pill was administered with applesauce/barium mixture and lodged in valleculae requiring thin barium which assisted in clearance. Aspiration risk appears low with thin and SLP recommending liquid upgrade to thin with volitional cough throughout the meal. Continue regular texture, crush larger pills in puree but small to medium can be whole with puree. ST will continue to follow. SLP Visit Diagnosis Dysphagia, pharyngeal phase (R13.13) Impact on safety and function Mild aspiration risk     09/07/2021   1:13 PM Treatment Recommendations Treatment Recommendations Therapy as outlined in treatment plan below     09/07/2021   1:13 PM Prognosis Prognosis for Safe Diet Advancement Good   09/07/2021   1:13 PM Diet Recommendations SLP Diet Recommendations Regular solids;Thin liquid Liquid Administration via Cup;No straw Medication Administration Crushed with puree Compensations Slow rate;Small sips/bites;Other (Comment) Postural Changes Seated upright at 90 degrees     09/07/2021   1:13 PM Other Recommendations Oral Care Recommendations Oral care BID Follow Up Recommendations Skilled nursing-short term rehab (<3 hours/day) Assistance recommended at discharge Intermittent Supervision/Assistance Functional Status Assessment Patient has had a recent decline in their functional status and demonstrates the ability to make significant improvements in function in a reasonable and predictable amount of time.   09/07/2021   1:13 PM Frequency and Duration  Speech Therapy Frequency (ACUTE ONLY) min  2x/week Treatment Duration 2 weeks     09/07/2021   1:13 PM Oral Phase Oral Phase WFL Oral - Nectar Teaspoon NT Oral - Nectar Cup WFL Oral - Nectar Straw NT Oral - Thin Teaspoon NT Oral - Thin Cup WFL Oral - Thin Straw WFL Oral - Puree WFL Oral - Mech Soft NT Oral - Regular [facility]    09/07/2021   1:13 PM Pharyngeal Phase Pharyngeal Phase Impaired Pharyngeal- Nectar Teaspoon NT Pharyngeal- Nectar Cup Pharyngeal residue - valleculae;Pharyngeal residue - pyriform Pharyngeal Material does not enter airway Pharyngeal- Nectar Straw NT Pharyngeal- Thin Teaspoon NT Pharyngeal- Thin Cup Penetration/Aspiration during swallow;Pharyngeal residue - pyriform;Pharyngeal residue - valleculae Pharyngeal Material enters airway, remains ABOVE vocal cords then ejected out;Material enters airway, remains ABOVE vocal cords and not ejected out Pharyngeal- Thin Straw Penetration/Aspiration during swallow Pharyngeal Material enters airway, remains ABOVE vocal cords and not ejected out Pharyngeal- Puree WFL Pharyngeal- Mechanical Soft NT Pharyngeal- Regular WFL Pharyngeal Material does not enter airway Pharyngeal- Multi-consistency NT Pharyngeal- Pill Pharyngeal residue - valleculae Pharyngeal Material does not enter airway    09/07/2021   1:13 PM Cervical Esophageal Phase  Cervical Esophageal Phase WFL [place] Siren 09/07/2021, 1:40 PM                     ? ?Assessment/Plan: ?S/p cord injury. Awaiting SNF placement. Continue therapy for now.  ? ? LOS: 70 days  ? ? ?Brian [name] ?09/07/2021, 2:09 PM ? ? ?  ?

## 2021-09-07 NOTE — Progress Notes (Signed)
?Medical consultation PROGRESS NOTE ? ? ? ?Brian [name]  [id number] DOB: Dec 13, 1960 DOA: 06/29/2021 ?PCP: Pcp, No  ? ? ? 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  He was admitted on 2/3 and underwent ACDF of C3-4, C4-5, C5-5, and C6-7.  Post-operatively he was progressing slowly with residual weakness in both hands but improving lower extremity strength and function.  He developed some difficulty swallowing on 2/5 and SLP ordered and placed on thickened liquids.   Overnight 2/6, patient with increased difficulty swallowing and was made NPO but also noted to have dysarthria and difficulty moving extremities with numbness.  SLP evaluated and found to be moderate aspiration risk.   Also progressively tachycardic, tachypneic, and now febrile.  Code stroke activated and taken for CT/ CTA head and neck and was given decadron 10mg  once.  NIHSS 18.  CTH was negative for acute findings, and CTA head neck did not reveal any LVO but noted significant prevertebral soft tissue swelling with foci of gas present at the ventral epidural space at C4-5, small collection not excluded. On 2/7, patient with worsening confusion and concern for airway involvement, PCCM consulted and remained in the intensive care unit until he is transferred to the floor on 3/2 and PCCM requested transition to [facility] for medical assistance while patient remains under the neurosurgery service. ? ?Significant Hospital Events: ?2/3 ACDF C3-4, C4-5, C5-5, and C6-7 w/ Dr. Annette Stable ?2/6 SLP eval for difficulty swallowing ?2/7 Code stroke overnight, neg for LVO, CT showing soft tissue swelling.  PCCM consulted for  concern of airway management and AMS.  Went for evacuation of hematoma  ?2/18 CT Abd/Pelvis: showing bilateral segmental Pes, started on heparin and showing worsening pneumonia, febrile to  103.  PCT rising, 34.6, restarted on abx ?2/19 possible aspiration w/ severe hypoxia; intubated; Switching from heparin to angiomax given subtherapeutic levels despite increase in rate. ?2/20 Echo shows normal LV systolic function. There was notation of a + McConnell's sign c/w a large pulmonary embolus which is consistent with the finding of bilateral segmental pulmonary emboli noted on CT 07/14/2021. ?2/21 rash appreciated on back; stopped cefepim/vanc switched to zosyn; required low dose levo with increase in fentanyl ?2/22 remains intubated; unable to extubate to do increase RR and thick secretions ?2/23 Ketamine infusion added ?2/24: weaning fentanyl.  ?2/26 extubated ?2/28 tachycardia persists ?3/2: Modified barium swallow, continues n.p.o. with core track in place, likely will need PEG tube. ?3/6: s/p IR G-tube placement ?3/16: Foley catheter discontinued ?3/24: E. coli UTI/bacteremia>> cefazolin x7 days per ID ?Now medically clear awaiting SNF placement still but per TOC has been difficult to place. ?4/5-4/6 Had a fall overnight and fell out of the bed and was unwitnessed but patient states he rolled out of the bed and did not get hurt. Has fall precautions now. ? ? ? ?Subjective: ? ?He is seen after returned from swallow study (MBS ), he did well with MBS, diet order upgraded , he continue to tolerating nutrition per tube at night and free water flushes  ? ?Urine is cloudy, soft bp, urine culture in process ? ?Reported fell yesterday while trying to reach for his eyeglasses, reports landed on left knee, reports  it is a soft fall, denies left knee pain, denies hit his head ? ? he is aaox3, but RN reports he seems to have  intermittent confusion  ? ?Assessment & Plan: ? Principal Problem: ?  Cervical myelopathy ([place]);  severe cervical spinal stenosis with cord compression s/p ACDF [id number] on 2/3, complicated by epidural hematoma s/p evacuation 2/7 ?Active Problems: ?  Acute respiratory failure with hypoxia ([place]) ?  Acute pulmonary embolism ([place]) ?  Acute metabolic encephalopathy ?  Severe sepsis due to recurrent aspiration pneumonia ?  E. coli bacteremia ?  Sinus tachycardia ?  Elevated liver enzymes ?  Hyponatremia ?  Tobacco use disorder ?  Physical deconditioning ?  Urinary retention ?  Protein-calorie malnutrition, severe ([place]) ?  Lumbar back pain ?  Fall during current hospitalization ?  Normocytic anemia ?  Thrombocytosis ? ? ? ?Assessment and Plan: ? ?* Cervical myelopathy ([facility]); severe cervical spinal stenosis with cord compression s/p ACDF [id number] on 2/3, complicated by epidural hematoma s/p evacuation 2/7 ?Patient initially presented s/p recent fall with progressive upper and lower extremity weakness and tremors, He was found to have critical multilevel cervical spinal stenosis @ C3-6 with spinal cord compression and spinal cord signal change and underwent ACDF C3-4, C4-5, C5-5, and C6/7 by neurosurgery Dr. Trenton Gammon on 06/29/2021.  Postoperative  complicated by epidural hematoma s/p evacuation on 2/7. ?-Continue PT/OT/SLP efforts, Awaiting SNF still but if improves enough maybe able to go Home with Home Health if continues to improve ?-Further as per neurosurgery recommendation. ? ?Acute respiratory failure with hypoxia ([place]) ?-Likely Multifactorial with significant dysphagia and recurrent aspiration pneumonia events during initial hospitalization following ACDF surgery with postoperative complications of postoperative cervical hematoma.   ?-Patient did require ventilatory support in the intensive care unit and was successfully extubated on 07/22/2021.   ?-Patient completed extensive course of empiric antibiotics.  Oxygen now weaned off, Now on room air. ?--Continue Aspiration precautions ? ? ? ? ?Acute pulmonary embolism  ([place]) ?-CTA chest showed bilateral segmental PE.   ?-LE [place] negative for DVT.  TTE with normal LV systolic function.  Supplemental oxygen weaned off. ?-C/w Eliquis 5mg  BID ? ?Acute metabolic encephalopathy ?-CT head, MRI brain, EEG, TSH, B12, ammonia unrevealing.  ?-Etiology likely multifactorial with acute respiratory failure secondary to aspiration pneumonia, bilateral pulmonary embolism.  -Completed course of antibiotics for aspiration pneumonia.  ?-Seroquel and clonazepam discontinued.  -Completed course of antibiotics for E. coli UTI/septicemia ? ?E. coli bacteremia ?-Urine culture and blood cultures x2 08/17/2028 positive for E. coli.   ?-Evaluated by infectious disease.   ?-Completed 7-day course of antibiotics per ID. ? ?Severe sepsis due to recurrent aspiration pneumonia ?-recurrent issue during hospitalization   ?-Completed extensive course of antibiotics while under ICU care.  ?-S/p IR gtube 3/6. ?-Continue tube feeds nocturnally  ?--Continue aspiration precautions ?-Continue SLP efforts while inpatient, he is improving, diet upgraded on 4/14 after repeat MBS ? ? SLP Diet Recommendations: Regular solids;Thin liquid ?  ? Liquid Administration via: Cup;No straw ?  ? Medication Administration: Crushed with puree ?  ? Supervision: Staff to assist with self feeding;Intermittent supervision to cue for compensatory strategies ?  ? Compensations: Slow rate;Small sips/bites;Other (Comment) (Cough intermittently) ?  ? Postural Changes: Seated upright at 90 degrees ?  ? Oral Care Recommendations: Oral care BID ?     ? ? ?Sinus tachycardia ?-Multifactorial including sepsis, dehydration, PE, agitation.   ?-Continue Metoprolol 100 mg twice daily ?-No longer on Telemetry ? ? ? ? ?  Elevated liver enzymes ?-Etiology likely secondary to sepsis from aspiration pneumonia as above.  Acute hepatitis panel negative.  RUQ ultrasound unremarkable. ?--Continue monitor LFTs intermittently but they have resolved and AST was 38 and ALT  was 31 on last check  ? ?Hyponatremia ?-Etiology likely secondary to dehydration.  Urine sodium low.  Continue tube feeds; now cleared by speech therapy for dysphagia 3 diet. ?-Na+  Went from 136 -> 130 -> 132 x2 on th

## 2021-09-08 DIAGNOSIS — G959 Disease of spinal cord, unspecified: Secondary | ICD-10-CM | POA: Diagnosis not present

## 2021-09-08 LAB — BASIC METABOLIC PANEL
Anion gap: 5 (ref 5–15)
BUN: 30 mg/dL — ABNORMAL HIGH (ref 6–20)
CO2: 27 mmol/L (ref 22–32)
Calcium: 9.9 mg/dL (ref 8.9–10.3)
Chloride: 104 mmol/L (ref 98–111)
Creatinine, Ser: 0.77 mg/dL (ref 0.61–1.24)
GFR, Estimated: >60 mL/min (ref >60)
Glucose, Bld: 119 mg/dL — ABNORMAL HIGH (ref 70–99)
Potassium: 4.2 mmol/L (ref 3.5–5.1)
Sodium: 136 mmol/L (ref 135–145)

## 2021-09-08 LAB — CBC WITH DIFFERENTIAL/PLATELET
Abs Immature Granulocytes: 0.01 10*3/uL (ref 0.00–0.07)
Basophils Absolute: 0 10*3/uL (ref 0.0–0.1)
Basophils Relative: 1 %
Eosinophils Absolute: 0.3 10*3/uL (ref 0.0–0.5)
Eosinophils Relative: 4 %
HCT: 26.6 % — ABNORMAL LOW (ref 39.0–52.0)
Hemoglobin: 8.6 g/dL — ABNORMAL LOW (ref 13.0–17.0)
Immature Granulocytes: 0 %
Lymphocytes Relative: 50 %
Lymphs Abs: 2.9 10*3/uL (ref 0.7–4.0)
MCH: 30.2 pg (ref 26.0–34.0)
MCHC: 32.3 g/dL (ref 30.0–36.0)
MCV: 93.3 fL (ref 80.0–100.0)
Monocytes Absolute: 0.9 10*3/uL (ref 0.1–1.0)
Monocytes Relative: 15 %
Neutro Abs: 1.8 10*3/uL (ref 1.7–7.7)
Neutrophils Relative %: 30 %
Platelets: 388 10*3/uL (ref 150–400)
RBC: 2.85 MIL/uL — ABNORMAL LOW (ref 4.22–5.81)
RDW: 16.5 % — ABNORMAL HIGH (ref 11.5–15.5)
WBC: 5.8 10*3/uL (ref 4.0–10.5)
nRBC: 0 % (ref 0.0–0.2)

## 2021-09-08 LAB — MAGNESIUM: Magnesium: 1.8 mg/dL (ref 1.7–2.4)

## 2021-09-08 LAB — GLUCOSE, CAPILLARY
Glucose-Capillary: 111 mg/dL — ABNORMAL HIGH (ref 70–99)
Glucose-Capillary: 116 mg/dL — ABNORMAL HIGH (ref 70–99)
Glucose-Capillary: 119 mg/dL — ABNORMAL HIGH (ref 70–99)
Glucose-Capillary: 125 mg/dL — ABNORMAL HIGH (ref 70–99)
Glucose-Capillary: 134 mg/dL — ABNORMAL HIGH (ref 70–99)
Glucose-Capillary: 94 mg/dL (ref 70–99)

## 2021-09-08 LAB — IRON AND TIBC
Iron: 62 ug/dL (ref 45–182)
Saturation Ratios: 20 % (ref 17.9–39.5)
TIBC: 304 ug/dL (ref 250–450)
UIBC: 242 ug/dL

## 2021-09-08 LAB — FERRITIN: Ferritin: 115 ng/mL (ref 24–336)

## 2021-09-08 MED ORDER — MAGNESIUM SULFATE IN D5W 1-5 GM/100ML-% IV SOLN
1.0000 g | Freq: Once | INTRAVENOUS | Status: AC
Start: 2021-09-08 — End: 2021-09-08
  Administered 2021-09-08: 1 g via INTRAVENOUS
  Filled 2021-09-08: qty 100

## 2021-09-08 MED ORDER — MELATONIN 3 MG PO TABS
3.0000 mg | ORAL_TABLET | Freq: Every day | ORAL | Status: DC
  Administered 2021-09-08 – 2021-11-01 (×53): 3 mg via ORAL
  Filled 2021-09-08 (×54): qty 1

## 2021-09-08 MED ORDER — TAMSULOSIN HCL 0.4 MG PO CAPS
0.4000 mg | ORAL_CAPSULE | Freq: Every day | ORAL | Status: DC
  Administered 2021-09-08: 0.4 mg via ORAL
  Filled 2021-09-08: qty 1

## 2021-09-08 NOTE — Progress Notes (Addendum)
?Medical consultation PROGRESS NOTE ? ? ? ?Brian [name]  [id number] DOB: Jan 21, 1961 DOA: 06/29/2021 ?PCP: Pcp, No  ? ? ? ?Brief Narrative:  ? ?Brian [name] is a 61 year old male  with h/o tobacco and EtOH use disorder who was admitted on 2/3 and underwent ACDF of C3-4, C4-5, C5-5, and C6-7.  developed Post- OP  epidural hematoma s/p evacuation 07/03/21, ?He developed respiratory failure due to  aspiration pneumonia and acute PE required intubation on 2/19,   ?had delirium//questionable alcohol withdrawal required katemine infusion on 2/23, extubated on 2/26,  transferred to floor on 3/2  ?required PEG tube placement on 3/6 due to dysphagia,  ?Hospital course also complicated by urinary retention ,uti, ecoli bacteremia  ? ?Significant Hospital Events: ?2/3 ACDF C3-4, C4-5, C5-5, and C6-7 w/ Dr. Annette Stable ?2/7 Code stroke overnight, neg for LVO, CT showing soft tissue swelling.  PCCM consulted for concern of airway management and AMS.  Went for evacuation of epidural hematoma  ?2/18 CT Abd/Pelvis: showing bilateral segmental PEs, started on heparin and showing worsening pneumonia, febrile to  103.  PCT rising, 34.6, restarted on abx ?2/19 possible aspiration w/ severe hypoxia; intubated; Switching from heparin to angiomax given subtherapeutic levels despite increase in rate. ?2/21 rash appreciated on back; stopped cefepim/vanc switched to zosyn; required low dose levo with increase in fentanyl ?2/23 Ketamine infusion added ?2/26 extubated ?3/6: s/p IR G-tube placement ?3/16: Foley catheter discontinued ?3/24: E. coli UTI/bacteremia>> cefazolin x7 days per ID ?4/5-4/6 Had a fall overnight and fell out of the bed and was unwitnessed but patient states he rolled out of the bed and did not get hurt. Has fall precautions now. ?4/73fell while trying to reach for his eyeglasses, reports landed on left knee, reports it is a soft fall, denies left knee pain, denies hit his head ?4/14 urine is cloudy, repeat urine culture +  citrobacter, started on recophin ? ? ? ?Subjective: ? ? ?Urine is cloudy, soft bp, urine culture + citrobacter ? ? ? he is aaox3, but RN reports he seems to have  intermittent confusion  ? ?Assessment & Plan: ? Principal Problem: ?  Cervical myelopathy ([place]); severe cervical spinal stenosis with cord compression s/p ACDF Q000111Q on 2/3, complicated by epidural hematoma s/p evacuation 2/7 ?Active Problems: ?  Acute respiratory failure with hypoxia ([place]) ?  Acute pulmonary embolism ([place]) ?  Acute metabolic encephalopathy ?  Severe sepsis due to recurrent aspiration pneumonia ?  E. coli bacteremia ?  Sinus tachycardia ?  Elevated liver enzymes ?  Hyponatremia ?  Tobacco use disorder ?  Physical deconditioning ?  Urinary retention ?  Protein-calorie malnutrition, severe ([place]) ?  Lumbar back pain ?  Fall during current hospitalization ?  Normocytic anemia ?  Thrombocytosis ? ? ? ?Assessment and Plan: ? ?* Cervical myelopathy ([facility]); severe cervical spinal stenosis with cord compression s/p ACDF Q000111Q on 2/3, complicated by epidural hematoma s/p evacuation 2/7 ?-Continue PT/OT/SLP efforts ?--Further as per neurosurgery recommendation. ? ?Acute respiratory failure with hypoxia ([place]) ?-from PNA/PE/encephalopathy  ?-intubated on 2/19, extubated on 07/22/2021.   ?-- Now on room air. ? ?Severe sepsis due to recurrent aspiration pneumonia/dysphagia ?--Completed extensive course of antibiotics while under ICU care.  ?-S/p IR gtube 3/6, Continue tube feeds nocturnally  ?--Continue aspiration precautions ?-Continue SLP efforts while inpatient, he is improving, diet upgraded on 4/14 after repeat MBS, now on Regular solids;Thin liquid, medsCrushed with puree ? ?Acute pulmonary embolism ([place]) ?-CTA chest showed bilateral segmental PE on 2/28 ?-  LE [place] negative for DVT.  TTE with normal LV systolic function.   ?-C/w Eliquis 5mg  BID ? ?Acute metabolic encephalopathy ?-CT head, MRI brain, EEG, TSH, B12, ammonia unrevealing.  ?-Etiology likely  multifactorial from sepsis, delirium, possible alcohol withdrawal ?-improved, aaox3 ? ? ?E. coli bacteremia/UTI ?-Urine culture and blood cultures x2 08/17/2028 positive for E. coli.   ?-Evaluated by infectious disease.   ?-Completed 7-day course of antibiotics per ID. ? ?Urine is noticed to be cloudy on 4/14 ,ua is concerning for UTI,  blood culture and urine culture in process , start rocephin empirically on 4/14, f/u on culture  ?Need bladder scan to measure PVR ? ?Urinary retention ?Foley catheter discontinued on 08/09/2021 with no current signs of urinary retention. ?--change bethanechol to flomax on 4/15 due to  unexplained hypotension ? ? ?Sinus tachycardia ?Tsh wnl ?-Multifactorial including sepsis, dehydration, PE, agitation.   ?-Continue Metoprolol  ?-No longer on Telemetry ? ?BP low normal on 4/11  ?decreased lopressor, continue to monitor ? ?Thrombocytosis ?-Likely Reactive ?-resolved  ? ?Normocytic anemia ?-No overt bleeding noted, FOBT ordered, pending collection  ?-iron level was low, improved on repeat ?-s/p prbcx1 on 2/23 ?-Patient's Hgb/Hct has been relatively stable ?--Repeat CBC intermittently  ? ? ?Elevated liver enzymes ?-Etiology likely secondary to sepsis from aspiration pneumonia as above.  Acute hepatitis panel negative.  RUQ ultrasound unremarkable. ?--lft has normalized  ? ?Hyponatremia ?-Etiology likely secondary to dehydration.  Urine sodium low.  Continue tube feeds;  ?-now normal ? ?Hyperglycemia-resolved as of 07/12/2021 ?-Likely due to steroid.  Resolved.  A1c 5.2% ?-CBGs ranging from 91-116 ? ? ?Lumbar back pain ?-  MR L-spine with mild stenosis L3/4 with potential for neural compression lateral recesses, moderate-severe stenosis L4-5 that could cause neural compression, L5-S1 with bilateral foraminal stenosis left worse than right. ?-Continue PT/OT as they are still recommending SNF ? ? ?Protein-calorie malnutrition, severe (HCC)/underweight  ?Body mass index is 16.8 kg/m?. ?As  evidenced by poor p.o. intake, significant muscle mass and subcu fat loss and significant weight loss (about 16 pounds since this hospitalization). ?Nutrition Problem: Severe Malnutrition (in the context of social/environmental circumstances) ?Etiology:  (inadequate energy intake) ?Signs/Symptoms: mild fat depletion, severe muscle depletion, severe muscle depletion ?Interventions: Refer to RD note for recommendations  ?--Dietitian following, appreciate assistance.   ?--s/p IR gtube 3/6.  ?----Continue tube feeds ?--continue SLP efforts, diet advanced  ? ?Physical deconditioning ?-Significant weakness in all extremities as a result of cervical myelopathy and acute illness.  Slowly improving while inpatient. ?-Continue PT/OT efforts, will need SNF placement; difficult placement per TOC  ? ?Falls during current hospitalization ?-[place] out of bed  ?-Did not lose consciousness and it was unwitnessed but denies any head trauma and only nursing assessment did not exhibit any focal deficits and at baseline mentation. ? ?[place] out of chair on 4/13, reports soft fall while trying to reach his eyeglasses, and landed on left knee ?Denies injury, denies left knee pain ? ?Tobacco use disorder/alcohol use  ?-Encouraged cessation. ? ? ? ? ? ? ?   ? ?I have Reviewed nursing notes, Vitals, pain scores, I/o's, Lab results and  imaging results since pt's last encounter, details please see discussion above  ?I ordered the following labs:  ?Unresulted Labs (From admission, onward)  ? ?  Start     Ordered  ? Unscheduled  Occult blood card to lab, stool  As needed,   R     ? 09/07/21 2219  ? ?  ?  ? ?  ? ? ? ?  DVT prophylaxis: SCD's Start: 06/29/21 1231 ?apixaban (ELIQUIS) tablet 5 mg  ? ?Code Status:   Code Status: Full Code ? ?Family Communication: patient ?Disposition: Awaiting SNF still but if improves enough maybe able to go Home with Home Health  ? ? ?Antimicrobials:   ?Anti-infectives (From admission, onward)  ? ? Start     Dose/Rate  Route Frequency Ordered Stop  ? 09/07/21 0800  cefTRIAXone (ROCEPHIN) 1 g in sodium chloride 0.9 % 100 mL IVPB       ?Note to Pharmacy: Please start rocephin after blood culture obtained  ? 1 g ?200 mL/hr ov

## 2021-09-08 NOTE — Progress Notes (Signed)
No change, says he slept well, pain controlled ?

## 2021-09-09 DIAGNOSIS — G959 Disease of spinal cord, unspecified: Secondary | ICD-10-CM | POA: Diagnosis not present

## 2021-09-09 LAB — GLUCOSE, CAPILLARY
Glucose-Capillary: 103 mg/dL — ABNORMAL HIGH (ref 70–99)
Glucose-Capillary: 118 mg/dL — ABNORMAL HIGH (ref 70–99)
Glucose-Capillary: 120 mg/dL — ABNORMAL HIGH (ref 70–99)
Glucose-Capillary: 127 mg/dL — ABNORMAL HIGH (ref 70–99)
Glucose-Capillary: 137 mg/dL — ABNORMAL HIGH (ref 70–99)
Glucose-Capillary: 98 mg/dL (ref 70–99)

## 2021-09-09 LAB — URINE CULTURE: Culture: >=100000 — AB

## 2021-09-09 MED ORDER — SODIUM CHLORIDE 0.9 % IV SOLN
1.0000 g | Freq: Three times a day (TID) | INTRAVENOUS | Status: DC
  Administered 2021-09-09 – 2021-09-10 (×4): 1 g via INTRAVENOUS
  Filled 2021-09-09 (×5): qty 10

## 2021-09-09 MED ORDER — FINASTERIDE 5 MG PO TABS
5.0000 mg | ORAL_TABLET | Freq: Every day | ORAL | Status: DC
  Administered 2021-09-09 – 2021-11-02 (×54): 5 mg via ORAL
  Filled 2021-09-09 (×55): qty 1

## 2021-09-09 MED ORDER — TAMSULOSIN HCL 0.4 MG PO CAPS
0.8000 mg | ORAL_CAPSULE | Freq: Every day | ORAL | Status: DC
  Administered 2021-09-09 – 2021-11-02 (×48): 0.8 mg via ORAL
  Filled 2021-09-09 (×53): qty 2

## 2021-09-09 NOTE — Progress Notes (Signed)
NEUROSURGERY PROGRESS NOTE ? ?Doing well. Got up for a "good while" yesterday and was please.  ? ?Temp:  [97.9 ?F (36.6 ?C)-98.5 ?F (36.9 ?C)] 98.5 ?F (36.9 ?C) (04/16 0802) ?Pulse Rate:  [95-110] 101 (04/16 0802) ?Resp:  [16-20] 17 (04/16 0802) ?BP: (97-116)/(67-76) 107/74 (04/16 0802) ?SpO2:  [98 %-100 %] 100 % (04/16 0802) ? ?Plan: ?Awaiting snf placement. Continue to mobilize and work with therapy. ? ?Sherryl Manges, NP ?09/09/2021 ?8:36 AM ?  ?

## 2021-09-09 NOTE — Progress Notes (Addendum)
?Medical consultation PROGRESS NOTE ? ? ? ?Brian [name]  [id number] DOB: [birth year] DOA: 06/29/2021 ?PCP: Pcp, No  ? ? ? ?Brief Narrative:  ? ?Brian [name] is a 60 year old male  with h/o tobacco and EtOH use disorder who was admitted on 2/3 and underwent ACDF of C3-4, C4-5, C5-5, and C6-7.  developed Post- OP  epidural hematoma s/p evacuation 07/03/21, ?He developed respiratory failure due to  aspiration pneumonia and acute PE required intubation on 2/19,   ?had delirium//questionable alcohol withdrawal required katemine infusion on 2/23, extubated on 2/26,  transferred to floor on 3/2  ?required PEG tube placement on 3/6 due to dysphagia,  ?Hospital course also complicated by urinary retention ,uti, ecoli bacteremia  ? ?Significant Hospital Events: ?2/3 ACDF C3-4, C4-5, C5-5, and C6-7 w/ Dr. Annette [name] ?2/7  evacuation of epidural hematoma  ?2/18 CT Abd/Pelvis: showing bilateral segmental PEs, started on heparin and showing worsening pneumonia, febrile to  103.  PCT rising, 34.6, ?2/19 possible aspiration w/ severe hypoxia; intubated; Switching from heparin to angiomax given subtherapeutic levels despite increase in rate. ?2/21 rash appreciated on back; stopped cefepim/vanc switched to zosyn; required low dose levo with increase in fentanyl ?2/23 Ketamine infusion added ?2/26 extubated ?3/6: s/p IR G-tube placement ?3/16: Foley catheter discontinued ?3/24: E. coli UTI/bacteremia>> cefazolin x7 days per ID ?4/5-4/6 Had a fall overnight and fell out of the bed and was unwitnessed but patient states he rolled out of the bed and did not get hurt. Has fall precautions now. ?4/38fell while trying to reach for his eyeglasses, reports landed on left knee, reports it is a soft fall, denies left knee pain, denies hit his head ?4/14 urine is cloudy, repeat urine culture + citrobacter,  ?4/16 reinserted foley due to recurrent urinary retention  ? ? ? ?Subjective: ? ?Urine is still very cloudy ?Post void residual > 300 cc,  foley reinserted  ?soft bp, urine culture + citrobacter ? ?He tolerates tube feeds at night  and free water flushes ?Swallow function has improved, regular diet ordered on 4/14 but he does not appear to eat much ? ? he is aaox3, but RN reports he seems to have  intermittent confusion  ? ?Assessment & Plan: ? Principal Problem: ?  Cervical myelopathy ([place]); severe cervical spinal stenosis with cord compression s/p ACDF Q000111Q on 2/3, complicated by epidural hematoma s/p evacuation 2/7 ?Active Problems: ?  Acute respiratory failure with hypoxia ([place]) ?  Acute pulmonary embolism ([place]) ?  Acute metabolic encephalopathy ?  Severe sepsis due to recurrent aspiration pneumonia ?  E. coli bacteremia ?  Sinus tachycardia ?  Elevated liver enzymes ?  Hyponatremia ?  Tobacco use disorder ?  Physical deconditioning ?  Urinary retention ?  Protein-calorie malnutrition, severe ([place]) ?  Lumbar back pain ?  Fall during current hospitalization ?  Normocytic anemia ?  Thrombocytosis ? ? ? ?Assessment and Plan: ? ?* Cervical myelopathy ([facility]); severe cervical spinal stenosis with cord compression s/p ACDF Q000111Q on 2/3, complicated by epidural hematoma s/p evacuation 2/7 ?-Continue PT/OT/SLP efforts ?--Further as per neurosurgery recommendation. ? ?Acute respiratory failure with hypoxia ([place]) ?-from PNA/PE/encephalopathy  ?-intubated on 2/19, extubated on 07/22/2021.   ?-- Now on room air. ? ?Severe sepsis due to recurrent aspiration pneumonia/dysphagia ?--Completed extensive course of antibiotics while under ICU care.  ?-S/p IR gtube 3/6, Continue tube feeds nocturnally  ?--Continue aspiration precautions ?-Continue SLP efforts while inpatient, he is improving, diet upgraded on 4/14 after repeat MBS, now on  Regular solids;Thin liquid, medsCrushed with puree ?-oral intake is poor ? ?Acute pulmonary embolism ([place]) ?-CTA chest showed bilateral segmental PE on 2/28 ?-LE [place] negative for DVT.  TTE with normal LV systolic function.   ?-C/w Eliquis  5mg  BID ? ?Acute metabolic encephalopathy ?-CT head, MRI brain, EEG, TSH, B12, ammonia unrevealing.  ?-Etiology likely multifactorial from sepsis, delirium, possible alcohol withdrawal ?-improved, aaox3 ? ? ?E. coli bacteremia/UTI ?-Urine culture and blood cultures x2 08/17/2028 positive for E. coli.  Finished 7 days of abx treatment ? ?Recurrent UTI, likely due to  recurrent urinary retention ?Urine is noticed to be cloudy on 4/14 ,ua is concerning for UTI,  blood culture no growth , urine culture + citrobacter , plan for 7 days of abx ? ? ?Reccurent Urinary retention,  ?Enlarged prostate on CT , could also has neurogenic bladder ?Foley catheter discontinued on 08/09/2021  ?Developed recurrent UTI, bladder scan  showed PVD 381cc, foley reinserted on 4/16 ?--change bethanechol to flomax on 4/15 due to  unexplained hypotension, add proscar ?Needs  voiding trial and outpatient urology follow up ? ? ?Sinus tachycardia ?Tsh wnl ?-Multifactorial including sepsis, dehydration, PE, agitation.   ?-was started on Metoprolol 100mg  bid, now reduced to 12.5mg  bid with holding parameters due to borderline bp ? ?BP low normal on 4/11  ?decreased lopressor, continue to monitor ? ?Thrombocytosis ?-Likely Reactive ?-resolved  ? ?Normocytic anemia ?-No overt bleeding noted, FOBT ordered, pending collection  ?-iron level was low, improved on repeat ?-s/p prbcx1 on 2/23 ?-Patient's Hgb/Hct has been relatively [name] ?--Repeat CBC intermittently  ? ? ?Elevated liver enzymes ?-Etiology likely secondary to sepsis from aspiration pneumonia as above.  Acute hepatitis panel negative.  RUQ ultrasound unremarkable. ?--lft has normalized  ? ?Hyponatremia ?-Etiology likely secondary to dehydration.  Urine sodium low.  Continue tube feeds;  ?-now normal ? ?Hyperglycemia-resolved as of 07/12/2021 ?-Likely due to steroid.  Resolved.  A1c 5.2% ?-CBGs ranging from 91-116 ? ? ?Lumbar back pain ?-  MR L-spine with mild stenosis L3/4 with potential for  neural compression lateral recesses, moderate-severe stenosis L4-5 that could cause neural compression, L5-S1 with bilateral foraminal stenosis left worse than right. ?-Continue PT/OT as they are still recommending SNF ? ? ?Protein-calorie malnutrition, severe (HCC)/underweight  ?Body mass index is 16.8 kg/m?. ?As evidenced by poor p.o. intake, significant muscle mass and subcu fat loss and significant weight loss (about 16 pounds since this hospitalization). ?Nutrition Problem: Severe Malnutrition (in the context of social/environmental circumstances) ?Etiology:  (inadequate energy intake) ?Signs/Symptoms: mild fat depletion, severe muscle depletion, severe muscle depletion ?Interventions: Refer to RD note for recommendations  ?--Dietitian following, appreciate assistance.   ?--s/p IR gtube 3/6.  ?----Continue tube feeds ?--continue SLP efforts, diet advanced  ? ?Physical deconditioning ?-Significant weakness in all extremities as a result of cervical myelopathy and acute illness.  Slowly improving while inpatient. ?-Continue PT/OT efforts, will need SNF placement; difficult placement per TOC  ? ?Falls during this hospitalization ?-[place] out of bed  ?-Did not lose consciousness and it was unwitnessed but denies any head trauma and only nursing assessment did not exhibit any focal deficits and at baseline mentation. ? ?[place] out of chair on 4/13, reports soft fall while trying to reach his eyeglasses, and landed on left knee ?Denies injury, denies left knee pain ? ?Tobacco use disorder/alcohol use  ?-Encouraged cessation. ? ? ? ? ? ? ?   ? ?I have Reviewed nursing notes, Vitals, pain scores, I/o's, Lab results and  imaging results  since pt's last encounter, details please see discussion above  ?I ordered the following labs:  ?Unresulted Labs (From admission, onward)  ? ?  Start     Ordered  ? 09/10/21 0500  CBC  Every Monday (0500),   R (with TIMED occurrences)     ?Question:  Specimen collection method  Answer:   Lab=Lab collect  ? 09/09/21 2147  ? 09/10/21 [id number]  Basic metabolic panel  Every Monday (0500),   R (with TIMED occurrences)     ?Question:  Specimen collection method  Answer:  Lab=Lab collect  ? 09/09/21 21

## 2021-09-09 NOTE — Plan of Care (Signed)
?  Problem: Safety: ?Goal: Ability to remain free from injury will improve ?Outcome: Progressing ?  ?Problem: Nutrition: ?Goal: Adequate nutrition will be maintained ?Outcome: Progressing ?  ?Problem: Elimination: ?Goal: Will not experience complications related to urinary retention ?Outcome: Progressing ?  ?Problem: Safety: ?Goal: Ability to remain free from injury will improve ?Outcome: Progressing ?  ?

## 2021-09-10 DIAGNOSIS — G959 Disease of spinal cord, unspecified: Secondary | ICD-10-CM | POA: Diagnosis not present

## 2021-09-10 DIAGNOSIS — A498 Other bacterial infections of unspecified site: Secondary | ICD-10-CM | POA: Diagnosis not present

## 2021-09-10 LAB — CBC
HCT: 26.9 % — ABNORMAL LOW (ref 39.0–52.0)
Hemoglobin: 8.7 g/dL — ABNORMAL LOW (ref 13.0–17.0)
MCH: 30 pg (ref 26.0–34.0)
MCHC: 32.3 g/dL (ref 30.0–36.0)
MCV: 92.8 fL (ref 80.0–100.0)
Platelets: 390 10*3/uL (ref 150–400)
RBC: 2.9 MIL/uL — ABNORMAL LOW (ref 4.22–5.81)
RDW: 16.5 % — ABNORMAL HIGH (ref 11.5–15.5)
WBC: 4.7 10*3/uL (ref 4.0–10.5)
nRBC: 0 % (ref 0.0–0.2)

## 2021-09-10 LAB — GLUCOSE, CAPILLARY
Glucose-Capillary: 133 mg/dL — ABNORMAL HIGH (ref 70–99)
Glucose-Capillary: 88 mg/dL (ref 70–99)
Glucose-Capillary: 99 mg/dL (ref 70–99)
Glucose-Capillary: 96 mg/dL (ref 70–99)
Glucose-Capillary: 137 mg/dL — ABNORMAL HIGH (ref 70–99)

## 2021-09-10 LAB — BASIC METABOLIC PANEL
Anion gap: 6 (ref 5–15)
BUN: 26 mg/dL — ABNORMAL HIGH (ref 6–20)
CO2: 27 mmol/L (ref 22–32)
Calcium: 9.8 mg/dL (ref 8.9–10.3)
Chloride: 102 mmol/L (ref 98–111)
Creatinine, Ser: 0.72 mg/dL (ref 0.61–1.24)
GFR, Estimated: >60 mL/min (ref >60)
Glucose, Bld: 101 mg/dL — ABNORMAL HIGH (ref 70–99)
Potassium: 4.3 mmol/L (ref 3.5–5.1)
Sodium: 135 mmol/L (ref 135–145)

## 2021-09-10 MED ORDER — SULFAMETHOXAZOLE-TRIMETHOPRIM 800-160 MG PO TABS
1.0000 | ORAL_TABLET | Freq: Two times a day (BID) | ORAL | Status: AC
  Administered 2021-09-10 – 2021-09-15 (×10): 1 via ORAL
  Filled 2021-09-10 (×11): qty 1

## 2021-09-10 NOTE — Progress Notes (Signed)
Physical Therapy Treatment ?Patient Details ?Name: Luana ShuGerald E Schleifer ?MRN: [id number] ?DOB: 1960/11/08 ?Today's Date: 09/10/2021 ? ? ?History of Present Illness Pt is a 61 y/o male admitted 06/29/21 following C3-7 ACDF after progressive cervical myelopathy symptoms including ataxia, bil UE and LE shaking and weakness, and loss of function in his hands with subsequent fall. Pt initially had improvement in neurological symptoms, but postoperative course was complicated by dysphasia and ataxia secondary to and epidural hematoma with evacuation of hematoma on 2/7. Course complicated by delirium secondary to encephalopathy, sepsis secondary to aspiration PNA, reintubated 2/19-2/26 for airway protection, bilat segmental PEs 2/18. PEG placed 3/6. PMHx includes tobacco and EtOH use disorder. ? ?  ?PT Comments  ? ? Pt remains very motivated and eager for intensive rehab or to d/c home. Aware pt's insurance has no therapy benefits of any capacity. PT's session focused on transfers to/from EOB to drop arm recliner to prep for transfers to/from w/c. Pt much improved and has improved to close min guard. Suspect with intensive rehab program pt can achieve safe mod I power w/c level of mobility with intermittent assist for IADLs, showering, meal prep, and medicine management in addition to transportation. Pt to highly benefit from AIR upon d/c for maximal functional recovery for safe transition home. Acute PT to cont to follow. ?   ?Recommendations for follow up therapy are one component of a multi-disciplinary discharge planning process, led by the attending physician.  Recommendations may be updated based on patient status, additional functional criteria and insurance authorization. ? ?Follow Up Recommendations ? Acute inpatient rehab (3hours/day) ?  ?  ?Assistance Recommended at Discharge    ?Patient can return home with the following Two people to help with walking and/or transfers;A lot of help with  bathing/dressing/bathroom;Assistance with cooking/housework;Assistance with feeding;Direct supervision/assist for medications management;Direct supervision/assist for financial management;Assist for transportation;Help with stairs or ramp for entrance ?  ?Equipment Recommendations ? Wheelchair (measurements PT);Wheelchair cushion (measurements PT);Hospital bed;Rolling walker (2 wheels) (power tilt in space w/c)  ?  ?Recommendations for Other Services Rehab consult ? ? ?  ?Precautions / Restrictions Precautions ?Precautions: Fall ?Precaution Booklet Issued: No ?Required Braces or Orthoses: Cervical Brace (has been d/c'd) ?Cervical Brace:  (now d/c'd) ?Restrictions ?Weight Bearing Restrictions: No  ?  ? ?Mobility ? Bed Mobility ?Overal bed mobility: Needs Assistance ?Bed Mobility: Supine to Sit ?  ?  ?Supine to sit: Supervision, HOB elevated ?  ?  ?General bed mobility comments: pt didn't use bed rail, able to bring self to EOB without physical assist, supervision for safety ?  ? ?Transfers ?Overall transfer level: Needs assistance ?  ?Transfers: Bed to chair/wheelchair/BSC ?Sit to Stand:  (x4) ?Stand pivot transfers: Min assist ?Step pivot transfers:  (w/c to recliner) ?  ?  ? Lateral/Scoot Transfers: Min guard, Min assist (to both L and R) ?General transfer comment: focused on lateral scoot/squat pvt transfers from EOB in/out of drop arm recliner to prep for w/c transfers for ultimate goal of returning home, completed 15 transfers progressing from modA to min G. Pt requiring max verbal cues initial for safe hand placment and tactile cues to achieve full anterior weight shift to improve ability to lift bottom off EOB to clear anticipated space between bed and power w/c (hopeful to achieve power w/c prior to d/c), at end pt able to complete 2 steps to copmlete step pvt transfers with minA ?  ? ?Ambulation/Gait ?  ?  ?  ?  ?  ?  ?  ?  ? ? ?  Stairs ?  ?  ?  ?  ?  ? ? ?Wheelchair Mobility ?  ? ?Modified Rankin (Stroke  Patients Only) ?Modified Rankin (Stroke Patients Only) ?Pre-Morbid Rankin Score: No symptoms ?Modified Rankin: Severe disability ? ? ?  ?Balance Overall balance assessment: Needs assistance ?Sitting-balance support: No upper extremity supported, Feet supported ?Sitting balance-Leahy Scale: Fair ?Sitting balance - Comments: pt able to reach down and assist with donning of shoes today ?Postural control: Posterior lean ?Standing balance support: Bilateral upper extremity supported, During functional activity ?Standing balance-Leahy Scale: Poor ?Standing balance comment: requires external support ?  ?  ?  ?  ?  ?  ?  ?  ?  ?  ?  ?  ? ?  ?Cognition Arousal/Alertness: Awake/alert ?Behavior During Therapy: [facility] for tasks assessed/performed ?Overall Cognitive Status: No family/caregiver present to determine baseline cognitive functioning ?Area of Impairment: Safety/judgement ?  ?  ?  ?  ?  ?  ?  ?  ?  ?  ?  ?Following Commands: Follows one step commands consistently, Follows multi-step commands consistently ?Safety/Judgement: Decreased awareness of safety, Decreased awareness of deficits ?Awareness: Emergent ?Problem Solving: Requires verbal cues, Requires tactile cues ?General Comments: pt more aware of limitations and is more realistic of optimal functional return and that most likely he will be in a power w/c for him to return home independently as pt isn't safe at this time to ambulate without physical assist ?  ?  ? ?  ?Exercises   ? ?  ?General Comments   ?  ?  ? ?Pertinent Vitals/Pain Pain Assessment ?Pain Assessment: Faces ?Faces Pain Scale: Hurts even more ?Pain Location: onset of spasms, and bilat hands at end of session ?Pain Descriptors / Indicators: Tightness, Spasm  ? ? ?Home Living   ?  ?  ?  ?  ?  ?  ?  ?  ?  ?   ?  ?Prior Function    ?  ?  ?   ? ?PT Goals (current goals can now be found in the care plan section) Acute Rehab PT Goals ?Patient Stated Goal: home ?PT Goal Formulation: With patient ?Time For Goal  Achievement: 09/17/21 ?Potential to Achieve Goals: Good ?Progress towards PT goals: Progressing toward goals ? ?  ?Frequency ? ? ? Min 5X/week ? ? ? ?  ?PT Plan Current plan remains appropriate  ? ? ?Co-evaluation   ?  ?  ?  ?  ? ?  ?AM-PAC PT "6 Clicks" Mobility   ?Outcome Measure ? Help needed turning from your back to your side while in a flat bed without using bedrails?: A Little ?Help needed moving from lying on your back to sitting on the side of a flat bed without using bedrails?: A Little ?Help needed moving to and from a bed to a chair (including a wheelchair)?: A Little ?Help needed standing up from a chair using your arms (e.g., wheelchair or bedside chair)?: A Little ?Help needed to walk in hospital room?: A Lot ?Help needed climbing 3-5 steps with a railing? : Total ?6 Click Score: 15 ? ?  ?End of Session Equipment Utilized During Treatment: Gait belt ?Activity Tolerance: Patient tolerated treatment well ?Patient left: with call bell/phone within reach;with chair alarm set;in chair ?Nurse Communication: Mobility status ?PT Visit Diagnosis: Muscle weakness (generalized) (M62.81);Other symptoms and signs involving the nervous system (R29.898);Difficulty in walking, not elsewhere classified (R26.2);Other abnormalities of gait and mobility (R26.89) ?  ? ? ?Time: 1133-1209 ?PT Time  Calculation (min) (ACUTE ONLY): 36 min ? ?Charges:  $Therapeutic Activity: 23-37 mins          ?          ? ?[name], PT, DPT ?Acute Rehabilitation Services ?Secure chat preferred ?Office #: [phone] ? ? ? ?Melanie Pellot M Kristeena Meineke ?09/10/2021, 2:09 PM ? ?

## 2021-09-10 NOTE — Assessment & Plan Note (Addendum)
-  Patient developed Citrobacter UTI a few days, likely secondary to persistent urinary retention during hospitalization due to poor mobility status.  ?-Foley catheter reinserted 09/09/2021 ?-Status post 7-day course Bactrim 800-160 mg twice daily. ?

## 2021-09-10 NOTE — Progress Notes (Signed)
Continues to make slow steady progress.  No new issues ?

## 2021-09-10 NOTE — Progress Notes (Signed)
?PROGRESS NOTE ? ? ? ?Brian [name]  [id number] DOB: Apr 28, 1961 DOA: 06/29/2021 ?PCP: Pcp, No  ? ? 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  He was admitted on 2/3 and underwent ACDF of C3-4, C4-5, C5-5, and C6-7.  Post-operatively he was progressing slowly with residual weakness in both hands but improving lower extremity strength and function.  He developed some difficulty swallowing on 2/5 and SLP ordered and placed on thickened liquids.   Overnight 2/6, patient with increased difficulty swallowing and was made NPO but also noted to have dysarthria and difficulty moving extremities with numbness.  SLP evaluated and found to be moderate aspiration risk.   Also progressively tachycardic, tachypneic, and now febrile.  Code stroke activated and taken for CT/ CTA head and neck and was given decadron 10mg  once.  NIHSS 18.  CTH was negative for acute findings, and CTA head neck did not reveal any LVO but noted significant prevertebral soft tissue swelling with foci of gas present at the ventral epidural space at C4-5, small collection not excluded. On 2/7, patient with worsening confusion and concern for airway involvement, PCCM consulted and remained in the intensive care unit until he is transferred to the floor on 3/2 and PCCM requested transition to [facility] for medical assistance while patient remains under the neurosurgery service. ? ?Significant Hospital Events: ?2/3 ACDF C3-4, C4-5, C5-5, and C6-7 w/ Dr. Annette Stable ?2/6 SLP eval for difficulty swallowing ?2/7 Code stroke overnight, neg for LVO, CT showing soft tissue swelling.  PCCM consulted for concern of airway  management and AMS.  Went for evacuation of hematoma  ?2/18 CT Abd/Pelvis: showing bilateral segmental Pes, started on heparin and showing worsening pneumonia, febrile to  103.  PCT rising, 34.6, restarted on abx ?2/19 possible aspiration w/ severe hypoxia; intubated; Switching from heparin to angiomax given subtherapeutic levels despite increase in rate. ?2/20 Echo shows normal LV systolic function. There was notation of a + McConnell's sign c/w a large pulmonary embolus which is consistent with the finding of bilateral segmental pulmonary emboli noted on CT 07/14/2021. ?2/21 rash appreciated on back; stopped cefepim/vanc switched to zosyn; required low dose levo with increase in fentanyl ?2/22 remains intubated; unable to extubate to do increase RR and thick secretions ?2/23 Ketamine infusion added ?2/24: weaning fentanyl.  ?2/26 extubated ?2/28 tachycardia persists ?3/2: Modified barium swallow, continues n.p.o. with core track in place, likely will need PEG tube. ?3/6: s/p IR G-tube placement ?3/16: Foley catheter discontinued ?3/24: E. coli UTI/bacteremia>> cefazolin x7 days per ID ?4/5-4/6 Had a fall overnight and fell out of the bed and was unwitnessed but patient states he rolled out of the bed and did not get hurt. Has fall precautions now. ?4/26fell while trying to reach for his eyeglasses, reports landed on left knee, reports it is a soft fall, denies left knee pain, denies hit his head ?4/14 urine is cloudy, repeat urine culture + citrobacter,  ?4/16 reinserted foley due to recurrent urinary retention  ?medically clear awaiting SNF placement; difficult to place per TOC. ?  ? ?  ?Assessment and Plan: ?* Cervical myelopathy (HCC); severe cervical spinal stenosis with cord compression s/p ACDF [id number] on 2/3, complicated by epidural  hematoma s/p evacuation 2/7 ?Patient initially presented s/p recent fall with progressive upper and lower extremity weakness and tremors, He was found to have critical multilevel  cervical spinal stenosis @ C3-6 with spinal cord compression and spinal cord signal change and underwent ACDF C3-4, C4-5, C5-5, and C6/7 by neurosurgery Dr. Trenton Gammon on 06/29/2021.  Postoperative complicated by epidural hematoma s/p evacuation on 2/7. ?--Continue PT/OT/SLP efforts ?--Awaiting SNF placement per TOC ?--Further as per neurosurgery recommendation. ? ?Acute respiratory failure with hypoxia ([place]) ?Likely Multifactorial with significant dysphagia and recurrent aspiration pneumonia events during initial hospitalization following ACDF surgery with postoperative complications of postoperative cervical hematoma.   ?Patient did require ventilatory support in the intensive care unit and was successfully extubated on 07/22/2021.  Patient completed extensive course of empiric antibiotics.  Oxygen now weaned off, on room air. ?--Continue Aspiration precautions ? ? ? ? ? ?Acute pulmonary embolism ([place]) ?CTA chest showed bilateral segmental PE.   LE [place] negative for DVT.  TTE with normal LV systolic function.  Supplemental oxygen weaned off. ?--Eliquis 5mg  BID ? ?Acute metabolic encephalopathy ?CT head, MRI brain, EEG, TSH, B12, ammonia unrevealing.  Etiology likely multifactorial with acute respiratory failure secondary to aspiration pneumonia, bilateral pulmonary embolism.  -Completed course of antibiotics for aspiration pneumonia.  Seroquel and clonazepam discontinued.  Completed course of antibiotics for E. coli UTI/septicemia. ?--Supportive care ? ?E. coli bacteremia ?Urine culture and blood cultures x2 08/17/2028 positive for E. coli. Evaluated by infectious disease.  Completed 7-day course of antibiotics per ID. ? ?Severe sepsis due to recurrent aspiration pneumonia ?Completed extensive course of antibiotics while under ICU care. S/p IR gtube 3/6. ?--Continue tube feeds nocturnally  ?--Cleared for regular diet with thin liquids per SLP; but with very little oral intake per RN; and patient with little desire for oral  intake ?--Continue aspiration precautions ?--Continue SLP efforts while inpatient ? ?Sinus tachycardia ?Multifactorial including sepsis, dehydration, PE, agitation.   ?--Continue Metoprolol 12.5 mg twice daily ? ? ? ? ?Elevated liver enzymes ?Etiology likely secondary to sepsis from aspiration pneumonia as above.  Acute hepatitis panel negative.  RUQ ultrasound unremarkable. ?--Continue monitor LFTs intermittently   ? ?Hyponatremia ?Etiology likely secondary to dehydration.  Urine sodium low.  Continue tube feeds; now cleared by speech therapy for dysphagia 3 diet. ?-- Continue to monitor BMP intermittently ? ?Tobacco use disorder ?Counseled on need for complete cessation.  Treated with nicotine patch which has now been weaned off. ? ?Physical deconditioning ?Significant weakness in all extremities as a result of cervical myelopathy and acute illness.  Slowly improving while inpatient. ?--Continue PT/OT efforts ?--Pending SNF placement; difficult placement per TOC  ? ?Urinary retention ?Foley catheter discontinued on 08/09/2021; replaced on 09/09/2021 for recurrent urinary retention.  Etiology likely secondary to poor mobility status. ?--Tamsulosin 0.8 mg p.o. daily ?--Finasteride 5 mg p.o. daily ?--Continue to monitor urinary output ? ?Protein-calorie malnutrition, severe ([place]) ?As evidenced by poor p.o. intake, significant muscle mass and subcu fat loss and significant weight loss (about 16 pounds since this hospitalization). ?Nutrition Problem: Severe Malnutrition (in the context of social/environmental circumstances) ?Etiology:  (inadequate energy intake) ?Signs/Symptoms: mild fat depletion, severe muscle depletion, severe muscle depletion ?Interventions: Refer to RD note for recommendations  ?--Dietitian following, appreciate assistance.   ?--s/p IR gtube 3/6.  ?--Now cleared for regular diet with thin liquids ?--Continue tube feeds; transition to nocturnal ?--continue SLP efforts ? ?Thrombocytosis ?Likely  Reactive ?--Platelet Count 588 -> 516 -> 411 ?--Continue to Monitor and trend intermittently  ? ?  Normocytic anemia ?-Patient's Hgb/Hct has been relatively stable ?-Hgb/Hct went from 9.3/29.2 -> 9.0/27.6 -> 8.9/27.0 ?-

## 2021-09-10 NOTE — Progress Notes (Signed)
Citrobacter in the urine is likely to be AmpC producer due to resistance to ceftriaxone. Cefepime would be stable against it. D/w Dr [place], we will optimize cefepime to PO septra to complete the 7d of therapy.  ? ?Ulyses Southward, PharmD, BCIDP, AAHIVP, CPP ?Infectious Disease Pharmacist ?09/10/2021 1:48 PM ? ? ?

## 2021-09-11 DIAGNOSIS — G959 Disease of spinal cord, unspecified: Secondary | ICD-10-CM | POA: Diagnosis not present

## 2021-09-11 LAB — GLUCOSE, CAPILLARY
Glucose-Capillary: 110 mg/dL — ABNORMAL HIGH (ref 70–99)
Glucose-Capillary: 111 mg/dL — ABNORMAL HIGH (ref 70–99)
Glucose-Capillary: 112 mg/dL — ABNORMAL HIGH (ref 70–99)
Glucose-Capillary: 112 mg/dL — ABNORMAL HIGH (ref 70–99)
Glucose-Capillary: 116 mg/dL — ABNORMAL HIGH (ref 70–99)
Glucose-Capillary: 96 mg/dL (ref 70–99)

## 2021-09-11 MED ORDER — BACLOFEN 10 MG PO TABS
10.0000 mg | ORAL_TABLET | Freq: Three times a day (TID) | ORAL | Status: DC
  Administered 2021-09-11 – 2021-09-16 (×16): 10 mg via ORAL
  Filled 2021-09-11 (×16): qty 1

## 2021-09-11 NOTE — Progress Notes (Signed)
Physical Therapy Treatment ?Patient Details ?Name: Brian [name] ?MRN: [id number] ?DOB: 01/24/1961 ?Today's Date: 09/11/2021 ? ? ?History of Present Illness Pt is a 61 y/o male admitted 06/29/21 following C3-7 ACDF after progressive cervical myelopathy symptoms including ataxia, bil UE and LE shaking and weakness, and loss of function in his hands with subsequent fall. Pt initially had improvement in neurological symptoms, but postoperative course was complicated by dysphasia and ataxia secondary to and epidural hematoma with evacuation of hematoma on 2/7. Course complicated by delirium secondary to encephalopathy, sepsis secondary to aspiration PNA, reintubated 2/19-2/26 for airway protection, bilat segmental PEs 2/18. PEG placed 3/6. PMHx includes tobacco and EtOH use disorder. ? ?  ?PT Comments  ? ? Pt with frequent and severe bilat LE and UE spasms today causing pt to be in 10/10 pain. Meagan, neurosurgery PA came during session. Discussed scheduled baclofen and adjusting dosage to improve pt's ability to function, participate in therapy, and sleep, as pt reports "I haven't slept in 3 nights because my hands keep spasming and kill me". Despite this pt eager to participate in therapy however limited by freq spasms and requiring increased assist today. Acute PT to cont to follow. ?  ?Recommendations for follow up therapy are one component of a multi-disciplinary discharge planning process, led by the attending physician.  Recommendations may be updated based on patient status, additional functional criteria and insurance authorization. ? ?Follow Up Recommendations ? Acute inpatient rehab (3hours/day) ?  ?  ?Assistance Recommended at Discharge Frequent or constant Supervision/Assistance  ?Patient can return home with the following Two people to help with walking and/or transfers;A lot of help with bathing/dressing/bathroom;Assistance with cooking/housework;Assistance with feeding;Direct supervision/assist for  medications management;Direct supervision/assist for financial management;Assist for transportation;Help with stairs or ramp for entrance ?  ?Equipment Recommendations ? Wheelchair (measurements PT);Wheelchair cushion (measurements PT);Hospital bed;BSC/3in1 (tilt in space power w/c with appropriate cushion, [place] most likely, drop arm BSC)  ?  ?Recommendations for Other Services Rehab consult ? ? ?  ?Precautions / Restrictions Precautions ?Precautions: Fall ?Precaution Booklet Issued: No ?Precaution Comments: Verbally reviewed precautions ?Required Braces or Orthoses: Cervical Brace (d/c'd) ?Restrictions ?Weight Bearing Restrictions: No  ?  ? ?Mobility ? Bed Mobility ?Overal bed mobility: Needs Assistance ?Bed Mobility: Supine to Sit, Sit to Supine ?  ?  ?Supine to sit: Supervision, HOB elevated ?Sit to supine: Mod assist, +2 for physical assistance (due to spasms) ?  ?General bed mobility comments: no physical assist need for transfer to EOB, HOB slightly elevated, pt with frequent spasms today requiring assist for LE management back into bed ?  ? ?Transfers ?Overall transfer level: Needs assistance ?Equipment used: None ?Transfers: Sit to/from Stand ?Sit to Stand: Mod assist ?  ?  ?Squat pivot transfers: Min assist ?  ?  ?General transfer comment: pt with frequent and severe spasms today limiting pt ability to stand and transfer requiring increased assist ?  ? ?Ambulation/Gait ?  ?  ?  ?  ?  ?  ?Pre-gait activities: attempted to side step along hallway rail to the L and R however pt's bilat LE spasms to frequent and severe pt unable to step safely today ?  ? ? ?Stairs ?  ?  ?  ?  ?  ? ? ?Wheelchair Mobility ?  ? ?Modified Rankin (Stroke Patients Only) ?  ? ? ?  ?Balance Overall balance assessment: Needs assistance ?Sitting-balance support: No upper extremity supported, Feet supported ?Sitting balance-Leahy Scale: Fair ?Sitting balance - Comments: able to maintain  sitting balance during training with sock aide ?   ?Standing balance support: Bilateral upper extremity supported, During functional activity ?Standing balance-Leahy Scale: Poor ?Standing balance comment: requires external support ?  ?  ?  ?  ?  ?  ?  ?  ?  ?  ?  ?  ? ?  ?Cognition Arousal/Alertness: Awake/alert ?Behavior During Therapy: [facility] for tasks assessed/performed ?Overall Cognitive Status: No family/caregiver present to determine baseline cognitive functioning ?  ?  ?  ?  ?  ?  ?  ?  ?  ?  ?  ?  ?  ?  ?  ?  ?General Comments: pt very motivated to achieve indep, pt more aware of deficits and becoming more aware of degree of deficits and realistic functional potential ?  ?  ? ?  ?Exercises Other Exercises ?Other Exercises: did some deep pressure therapy to LEs, shoulders, flank and bank to help manage spasms ? ?  ?General Comments General comments (skin integrity, edema, etc.): donned shoes in chair, R LE with severe spasms requiring increased time and effort to done R shoe today ?  ?  ? ?Pertinent Vitals/Pain Pain Assessment ?Pain Assessment: 0-10 ?Pain Score: 10-Worst pain ever ?Pain Location: spasm t/o UEs and LEs causing severe pain ?Pain Descriptors / Indicators: Grimacing, Spasm ?Pain Intervention(s): Monitored during session (spoke with Meagan, NP regarding scheduled baclofen and dosage as pt unable to sleep at night due to the spasms as well)  ? ? ?Home Living   ?  ?  ?  ?  ?  ?  ?  ?  ?  ?   ?  ?Prior Function    ?  ?  ?   ? ?PT Goals (current goals can now be found in the care plan section) Acute Rehab PT Goals ?Patient Stated Goal: home ?Progress towards PT goals: Progressing toward goals ? ?  ?Frequency ? ? ? Min 5X/week ? ? ? ?  ?PT Plan Current plan remains appropriate  ? ? ?Co-evaluation   ?  ?  ?  ?  ? ?  ?AM-PAC PT "6 Clicks" Mobility   ?Outcome Measure ? Help needed turning from your back to your side while in a flat bed without using bedrails?: A Little ?Help needed moving from lying on your back to sitting on the side of a flat bed without  using bedrails?: A Little ?Help needed moving to and from a bed to a chair (including a wheelchair)?: A Little ?Help needed standing up from a chair using your arms (e.g., wheelchair or bedside chair)?: A Lot ?Help needed to walk in hospital room?: A Lot ?Help needed climbing 3-5 steps with a railing? : Total ?6 Click Score: 14 ? ?  ?End of Session Equipment Utilized During Treatment: Gait belt ?Activity Tolerance: Other (comment) (limited by spams) ?Patient left: in bed;with call bell/phone within reach;with bed alarm set ?Nurse Communication: Mobility status ?PT Visit Diagnosis: Muscle weakness (generalized) (M62.81);Other symptoms and signs involving the nervous system (R29.898);Difficulty in walking, not elsewhere classified (R26.2);Other abnormalities of gait and mobility (R26.89) ?  ? ? ?Time: [id number] ?PT Time Calculation (min) (ACUTE ONLY): 46 min ? ?Charges:  $Gait Training: 8-22 mins ?$Therapeutic Exercise: 8-22 mins ?$Therapeutic Activity: 8-22 mins          ?          ? ?Kittie Plater, PT, DPT ?Acute Rehabilitation Services ?Secure chat preferred ?Office #: [phone] ? ? ? ?Tani Virgo M Blaize Nipper ?09/11/2021, 2:15 PM ? ?

## 2021-09-11 NOTE — Progress Notes (Signed)
?PROGRESS NOTE ? ? ? ?Brian [name]  [id number] DOB: [birth year] DOA: 06/29/2021 ?PCP: Pcp, No  ? ? 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  He was admitted on 2/3 and underwent ACDF of C3-4, C4-5, C5-5, and C6-7.  Post-operatively he was progressing slowly with residual weakness in both hands but improving lower extremity strength and function.  He developed some difficulty swallowing on 2/5 and SLP ordered and placed on thickened liquids.   Overnight 2/6, patient with increased difficulty swallowing and was made NPO but also noted to have dysarthria and difficulty moving extremities with numbness.  SLP evaluated and found to be moderate aspiration risk.   Also progressively tachycardic, tachypneic, and now febrile.  Code stroke activated and taken for CT/ CTA head and neck and was given decadron 10mg  once.  NIHSS 18.  CTH was negative for acute findings, and CTA head neck did not reveal any LVO but noted significant prevertebral soft tissue swelling with foci of gas present at the ventral epidural space at C4-5, small collection not excluded. On 2/7, patient with worsening confusion and concern for airway involvement, PCCM consulted and remained in the intensive care unit until he is transferred to the floor on 3/2 and PCCM requested transition to [facility] for medical assistance while patient remains under the neurosurgery service. ? ?Significant Hospital Events: ?2/3 ACDF C3-4, C4-5, C5-5, and C6-7 w/ Dr. Annette Stable ?2/6 SLP eval for difficulty swallowing ?2/7 Code stroke overnight, neg for LVO, CT showing soft tissue swelling.  PCCM consulted for concern of airway  management and AMS.  Went for evacuation of hematoma  ?2/18 CT Abd/Pelvis: showing bilateral segmental Pes, started on heparin and showing worsening pneumonia, febrile to  103.  PCT rising, 34.6, restarted on abx ?2/19 possible aspiration w/ severe hypoxia; intubated; Switching from heparin to angiomax given subtherapeutic levels despite increase in rate. ?2/20 Echo shows normal LV systolic function. There was notation of a + McConnell's sign c/w a large pulmonary embolus which is consistent with the finding of bilateral segmental pulmonary emboli noted on CT 07/14/2021. ?2/21 rash appreciated on back; stopped cefepim/vanc switched to zosyn; required low dose levo with increase in fentanyl ?2/22 remains intubated; unable to extubate to do increase RR and thick secretions ?2/23 Ketamine infusion added ?2/24: weaning fentanyl.  ?2/26 extubated ?2/28 tachycardia persists ?3/2: Modified barium swallow, continues n.p.o. with core track in place, likely will need PEG tube. ?3/6: s/p IR G-tube placement ?3/16: Foley catheter discontinued ?3/24: E. coli UTI/bacteremia>> cefazolin x7 days per ID ?4/5-4/6 Had a fall overnight and fell out of the bed and was unwitnessed but patient states he rolled out of the bed and did not get hurt. Has fall precautions now. ?4/8fell while trying to reach for his eyeglasses, reports landed on left knee, reports it is a soft fall, denies left knee pain, denies hit his head ?4/14 urine is cloudy, repeat urine culture + citrobacter,  ?4/16 reinserted foley due to recurrent urinary retention  ?medically clear awaiting SNF placement; difficult to place per TOC. ?  ? ?  ?Assessment and Plan: ?* Cervical myelopathy (HCC); severe cervical spinal stenosis with cord compression s/p ACDF [id number] on 2/3, complicated by epidural  hematoma s/p evacuation 2/7 ?Patient initially presented s/p recent fall with progressive upper and lower extremity weakness and tremors, He was found to have critical multilevel  cervical spinal stenosis @ C3-6 with spinal cord compression and spinal cord signal change and underwent ACDF C3-4, C4-5, C5-5, and C6/7 by neurosurgery Dr. Trenton Gammon on 06/29/2021.  Postoperative complicated by epidural hematoma s/p evacuation on 2/7. ?--Continue PT/OT/SLP efforts ?--Awaiting SNF placement per TOC ?--Further as per neurosurgery recommendation. ? ?Acute respiratory failure with hypoxia ([place]) ?Likely Multifactorial with significant dysphagia and recurrent aspiration pneumonia events during initial hospitalization following ACDF surgery with postoperative complications of postoperative cervical hematoma.   ?Patient did require ventilatory support in the intensive care unit and was successfully extubated on 07/22/2021.  Patient completed extensive course of empiric antibiotics.  Oxygen now weaned off, on room air. ?--Continue Aspiration precautions ? ? ? ? ? ?Acute pulmonary embolism ([place]) ?CTA chest showed bilateral segmental PE.   LE [place] negative for DVT.  TTE with normal LV systolic function.  Supplemental oxygen weaned off. ?--Eliquis 5mg  BID ? ?Acute metabolic encephalopathy ?CT head, MRI brain, EEG, TSH, B12, ammonia unrevealing.  Etiology likely multifactorial with acute respiratory failure secondary to aspiration pneumonia, bilateral pulmonary embolism.  -Completed course of antibiotics for aspiration pneumonia.  Seroquel and clonazepam discontinued.  Completed course of antibiotics for E. coli UTI/septicemia. ?--Supportive care ? ?E. coli bacteremia ?Urine culture and blood cultures x2 08/17/2028 positive for E. coli. Evaluated by infectious disease.  Completed 7-day course of antibiotics per ID. ? ?Severe sepsis due to recurrent aspiration pneumonia ?Completed extensive course of antibiotics while under ICU care. S/p IR gtube 3/6. ?--Continue tube feeds nocturnally  ?--Cleared for regular diet with thin liquids per SLP; but with very little oral intake per RN; and patient with little desire for oral  intake ?--Continue aspiration precautions ?--Continue SLP efforts while inpatient ? ?Sinus tachycardia ?Multifactorial including sepsis, dehydration, PE, agitation.   ?--Continue Metoprolol 12.5 mg twice daily ? ? ? ? ?Elevated liver enzymes ?Etiology likely secondary to sepsis from aspiration pneumonia as above.  Acute hepatitis panel negative.  RUQ ultrasound unremarkable. ?--Continue monitor LFTs intermittently   ? ?Hyponatremia ?Etiology likely secondary to dehydration.  Urine sodium low.  Continue tube feeds; now cleared by speech therapy for dysphagia 3 diet. ?-- Continue to monitor BMP intermittently ? ?Tobacco use disorder ?Counseled on need for complete cessation.  Treated with nicotine patch which has now been weaned off. ? ?Physical deconditioning ?Significant weakness in all extremities as a result of cervical myelopathy and acute illness.  Slowly improving while inpatient. ?--Continue PT/OT efforts ?--Pending SNF placement; difficult placement per TOC  ? ?Urinary retention ?Foley catheter discontinued on 08/09/2021; replaced on 09/09/2021 for recurrent urinary retention.  Etiology likely secondary to poor mobility status. ?--Tamsulosin 0.8 mg p.o. daily ?--Finasteride 5 mg p.o. daily ?--Continue to monitor urinary output ? ?Protein-calorie malnutrition, severe ([place]) ?As evidenced by poor p.o. intake, significant muscle mass and subcu fat loss and significant weight loss (about 16 pounds since this hospitalization). ?Nutrition Problem: Severe Malnutrition (in the context of social/environmental circumstances) ?Etiology:  (inadequate energy intake) ?Signs/Symptoms: mild fat depletion, severe muscle depletion, severe muscle depletion ?Interventions: Refer to RD note for recommendations  ?--Dietitian following, appreciate assistance.   ?--s/p IR gtube 3/6.  ?--Now cleared for regular diet with thin liquids ?--Continue tube feeds; transition to nocturnal ?--continue SLP efforts ? ?Citrobacter  infection ?Patient developed Citrobacter UTI, likely secondary to persistent urinary retention during hospitalization due to poor  mobility status.  Foley catheter reinserted 09/09/2021 ?--Bactrim 800-160 mg twice daily to complet

## 2021-09-11 NOTE — Progress Notes (Signed)
? ?  Providing Compassionate, Quality Care - Together ? ? ?Subjective: ?Patient reports significant muscle spasms in his arms and legs, especially while working with therapies. He currently has cyclobenzaprine 5 mg TID, but reports this is not helping. ? ?Objective: ?Vital signs in last 24 hours: ?Temp:  [97.8 ?F (36.6 ?C)-98.6 ?F (37 ?C)] 98.6 ?F (37 ?C) (04/18 1131) ?Pulse Rate:  [93-117] 117 (04/18 1131) ?Resp:  [16-20] 18 (04/18 1131) ?BP: (94-126)/(67-77) 116/75 (04/18 1131) ?SpO2:  [99 %-100 %] 99 % (04/18 1131) ? ?Intake/Output from previous day: ?04/17 0701 - 04/18 0700 ?In: [id number] [P.O.:60; [id number] ?Out: 2650 [Urine:2650] ?Intake/Output this shift: ?No intake/output data recorded. ? ?Alert; oriented to person, place, and time ?MAE, Generalized weakness secondary to deconditioning ?Decreased fine motor BUE ?Incision is well-healed ?  ? ?Lab Results: ?Recent Labs  ?  09/10/21 ?[id number]  ?WBC 4.7  ?HGB 8.7*  ?HCT 26.9*  ?PLT 390  ? ?BMET ?Recent Labs  ?  09/10/21 ?[id number]  ?NA 135  ?K 4.3  ?CL 102  ?CO2 27  ?GLUCOSE 101*  ?BUN 26*  ?CREATININE 0.72  ?CALCIUM 9.8  ? ? ?Studies/Results: ?No results found. ? ?Assessment/Plan: ?Patient status post C3-4, C4-5, C5-6, C6-7 anterior cervical discectomy with interbody fusion by Dr. Annette Stable on 06/29/2021. Increased difficulty swallowing with lung atelectasis and elevated temperatures on 07/02/2021. Made NPO following MBS by SLP. 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 CTA head and neck negative for stroke. MRI revealed epidural hematoma. Patient underwent exploration of his cervical fusion with evacuation of the epidural hematoma on 07/03/2021. Cortrak was placed on 07/04/2021. Patient's strength much improved since epidural hematoma evacuation. Cortrak removed 07/06/2021 and dysphagia 2 diet with nectar thick liquids started. Patient with delirium vs ETOH withdrawal. Discontinued steroids 07/06/2021. CIWA protocol started and  discontinued 07/07/2021. CT and MRI 07/07/2021 negative. Patient developed respiratory distress, requiring intubation on 07/15/2021. Work up revealed aspiration PNA and PE. Patient was extubated on 07/22/2021. Foley catheter removed 07/24/2021. Patient transferred to 3W on 07/26/2021. Foley replaced 07/28/2021. PEG placed 07/30/2021. Foley removed 08/09/2021. PEG pulled out by patient 08/16/2021. Delirium worsening 08/17/2021. Haldol administered. IR replaced PEG on 08/17/2021. Patient with urosepsis 08/18/2021. Culture grew E. Coli. C/o LBP 08/28/2021. Lumbar MRI demonstrated chronic degenerative changes with spinal stenosis at L3-4, L4-5, and L5-S1. Patient suffered a fall overnight 08/29/2021 without injury. Worsening muscle spasms 09/11/2021. ? ? LOS: 74 days  ? ?-Ordered Baclofen 10 mg TID, scheduled ?-Continue efforts at rehabilitation. ?-Goal is for SNF placement. ? ? ?Viona Gilmore, DNP, AGNP-C ?Nurse Practitioner ? ?[facility] ?[address], [address], [place], [place] [address] ?[id number]    [id number] ? ?09/11/2021, 11:18 AM ? ? ? ? ?

## 2021-09-11 NOTE — Plan of Care (Signed)
Pt is alert oriented x4, pt tube feedings continued per order. Pt refused to eat any supper he said he was just not feeling hungry yet. Pt c/o back pain, prn hycet given per order. Pt woke up stating he had a nightmare. He thinks its related to the pain medication. Pt asked for tylenol for pain around 5am. Pt has been turned and repositioned. Peri and foley care provided.  ? ?Problem: Education: ?Goal: Ability to verbalize activity precautions or restrictions will improve ?Outcome: Progressing ?Goal: Knowledge of the prescribed therapeutic regimen will improve ?Outcome: Progressing ?Goal: Understanding of discharge needs will improve ?Outcome: Progressing ?  ?Problem: Activity: ?Goal: Ability to avoid complications of mobility impairment will improve ?Outcome: Progressing ?Goal: Ability to tolerate increased activity will improve ?Outcome: Progressing ?Goal: Will remain free from falls ?Outcome: Progressing ?  ?Problem: Clinical Measurements: ?Goal: Ability to maintain clinical measurements within normal limits will improve ?Outcome: Progressing ?Goal: Postoperative complications will be avoided or minimized ?Outcome: Progressing ?Goal: Diagnostic test results will improve ?Outcome: Progressing ?  ?Problem: Pain Management: ?Goal: Pain level will decrease ?Outcome: Progressing ?  ?Problem: Skin Integrity: ?Goal: Will show signs of wound healing ?Outcome: Progressing ?  ?Problem: Health Behavior/Discharge Planning: ?Goal: Identification of resources available to assist in meeting health care needs will improve ?Outcome: Progressing ?  ?Problem: Bladder/Genitourinary: ?Goal: Urinary functional status for postoperative course will improve ?Outcome: Progressing ?  ?Problem: Safety: ?Goal: Ability to remain free from injury will improve ?Outcome: Progressing ?  ?Problem: Education: ?Goal: Knowledge of General Education information will improve ?Description: Including pain rating scale, medication(s)/side effects and  non-pharmacologic comfort measures ?Outcome: Progressing ?  ?Problem: Health Behavior/Discharge Planning: ?Goal: Ability to manage health-related needs will improve ?Outcome: Progressing ?  ?Problem: Clinical Measurements: ?Goal: Ability to maintain clinical measurements within normal limits will improve ?Outcome: Progressing ?Goal: Will remain free from infection ?Outcome: Progressing ?Goal: Diagnostic test results will improve ?Outcome: Progressing ?Goal: Respiratory complications will improve ?Outcome: Progressing ?Goal: Cardiovascular complication will be avoided ?Outcome: Progressing ?  ?Problem: Activity: ?Goal: Risk for activity intolerance will decrease ?Outcome: Progressing ?  ?Problem: Nutrition: ?Goal: Adequate nutrition will be maintained ?Outcome: Progressing ?  ?Problem: Coping: ?Goal: Level of anxiety will decrease ?Outcome: Progressing ?  ?Problem: Elimination: ?Goal: Will not experience complications related to bowel motility ?Outcome: Progressing ?Goal: Will not experience complications related to urinary retention ?Outcome: Progressing ?  ?Problem: Pain Managment: ?Goal: General experience of comfort will improve ?Outcome: Progressing ?  ?Problem: Safety: ?Goal: Ability to remain free from injury will improve ?Outcome: Progressing ?  ?Problem: Skin Integrity: ?Goal: Risk for impaired skin integrity will decrease ?Outcome: Progressing ?  ?

## 2021-09-11 NOTE — Progress Notes (Signed)
Occupational Therapy Treatment ?Patient Details ?Name: Brian ShuGerald E Thresher ?MRN: [id number] ?DOB: 12/06/1960 ?Today's Date: 09/11/2021 ? ? ?History of present illness Pt is a 61 y/o male admitted 06/29/21 following C3-7 ACDF after progressive cervical myelopathy symptoms including ataxia, bil UE and LE shaking and weakness, and loss of function in his hands with subsequent fall. Pt initially had improvement in neurological symptoms, but postoperative course was complicated by dysphasia and ataxia secondary to and epidural hematoma with evacuation of hematoma on 2/7. Course complicated by delirium secondary to encephalopathy, sepsis secondary to aspiration PNA, reintubated 2/19-2/26 for airway protection, bilat segmental PEs 2/18. PEG placed 3/6. PMHx includes tobacco and EtOH use disorder. ?  ?OT comments ? Patient received in supine and eager to participate with OT. Patient was able to get to EOB with supervision and increased time. Patient educated on sock aide use for donning socks with mod assist and difficulty getting sock on sock aide. Self feeding addressed seated in recliner with U-cuff on LUE.  Patient was able to feed self following setup and used RUE to stabilize cup/bowl. Nursing educated on U-cuff for self feeding. Acute OT to continue to follow.   ? ?Recommendations for follow up therapy are one component of a multi-disciplinary discharge planning process, led by the attending physician.  Recommendations may be updated based on patient status, additional functional criteria and insurance authorization. ?   ?Follow Up Recommendations ? Skilled nursing-short term rehab (<3 hours/day)  ?  ?Assistance Recommended at Discharge Frequent or constant Supervision/Assistance  ?Patient can return home with the following ? Two people to help with walking and/or transfers;Direct supervision/assist for medications management;Assist for transportation;Help with stairs or ramp for entrance;Assistance with feeding;Two people  to help with bathing/dressing/bathroom;Assistance with cooking/housework;Direct supervision/assist for financial management ?  ?Equipment Recommendations ? Wheelchair (measurements OT);Wheelchair cushion (measurements OT);Hospital bed;BSC/3in1;Tub/shower seat  ?  ?Recommendations for Other Services   ? ?  ?Precautions / Restrictions Precautions ?Precautions: Fall ?Precaution Booklet Issued: No ?Required Braces or Orthoses: Cervical Brace (has been d/c'd) ?Restrictions ?Weight Bearing Restrictions: No  ? ? ?  ? ?Mobility Bed Mobility ?Overal bed mobility: Needs Assistance ?Bed Mobility: Supine to Sit ?  ?  ?Supine to sit: Supervision, HOB elevated ?  ?  ?General bed mobility comments: increased time and patient wanted to attempt without assitance ?  ? ?Transfers ?Overall transfer level: Needs assistance ?Equipment used: None ?Transfers: Sit to/from Stand, Bed to chair/wheelchair/BSC ?Sit to Stand: Mod assist ?  ?Squat pivot transfers: Mod assist ?  ?  ?  ?General transfer comment: squat pivot transfer to recliner positioned on right ?  ?  ?Balance Overall balance assessment: Needs assistance ?Sitting-balance support: No upper extremity supported, Feet supported ?Sitting balance-Leahy Scale: Fair ?Sitting balance - Comments: able to maintain sitting balance during training with sock aide ?  ?Standing balance support: Bilateral upper extremity supported, During functional activity ?Standing balance-Leahy Scale: Poor ?Standing balance comment: requires external support ?  ?  ?  ?  ?  ?  ?  ?  ?  ?  ?  ?   ? ?ADL either performed or assessed with clinical judgement  ? ?ADL Overall ADL's : Needs assistance/impaired ?Eating/Feeding: Minimal assistance;With assist to don/doff brace/orthosis;Sitting ?Eating/Feeding Details (indicate cue type and reason): required assistance to donnu-cuff on LUE and used RUE to steady cup/bowl ?  ?  ?  ?  ?  ?  ?  ?  ?Lower Body Dressing: Moderate assistance;With adaptive  equipment;Sitting/lateral leans ?Lower Body Dressing  Details (indicate cue type and reason): donned socks with sock aide with assistance to place sock on sock aide and pulling up ?  ?  ?  ?  ?  ?  ?  ?General ADL Comments: focused on self feeding with U-cuff on LUE ?  ? ?Extremity/Trunk Assessment   ?  ?  ?  ?  ?  ? ?Vision   ?  ?  ?Perception   ?  ?Praxis   ?  ? ?Cognition Arousal/Alertness: Awake/alert ?Behavior During Therapy: [facility] for tasks assessed/performed ?Overall Cognitive Status: No family/caregiver present to determine baseline cognitive functioning ?Area of Impairment: Safety/judgement ?  ?  ?  ?  ?  ?  ?  ?  ?  ?  ?  ?Following Commands: Follows one step commands consistently, Follows multi-step commands consistently ?Safety/Judgement: Decreased awareness of safety, Decreased awareness of deficits ?Awareness: Emergent ?Problem Solving: Requires verbal cues, Requires tactile cues ?General Comments: eager to participate ?  ?  ?   ?Exercises   ? ?  ?Shoulder Instructions   ? ? ?  ?General Comments    ? ? ?Pertinent Vitals/ Pain       Pain Assessment ?Pain Assessment: Faces ?Faces Pain Scale: Hurts a little bit ?Pain Location: BUE hand, left shoulder ?Pain Descriptors / Indicators: Grimacing, Tightness ?Pain Intervention(s): Monitored during session, Repositioned ? ?Home Living   ?  ?  ?  ?  ?  ?  ?  ?  ?  ?  ?  ?  ?  ?  ?  ?  ?  ?  ? ?  ?Prior Functioning/Environment    ?  ?  ?  ?   ? ?Frequency ? Min 2X/week  ? ? ? ? ?  ?Progress Toward Goals ? ?OT Goals(current goals can now be found in the care plan section) ? Progress towards OT goals: Progressing toward goals ? ?Acute Rehab OT Goals ?Patient Stated Goal: get better ?OT Goal Formulation: With patient ?Time For Goal Achievement: 09/19/21 ?Potential to Achieve Goals: Fair ?ADL Goals ?Pt Will Perform Eating: with min assist;with assist to don/doff brace/orthosis;with adaptive utensils;sitting ?Pt Will Perform Grooming: sitting;with min assist ?Pt Will  Perform Upper Body Bathing: with mod assist;sitting ?Pt Will Transfer to Toilet: with min assist;stand pivot transfer;bedside commode ?Pt/caregiver will Perform Home Exercise Program: Increased ROM;Increased strength;Both right and left upper extremity;With theraputty;With minimal assist;With written HEP provided ?Additional ADL Goal #1: Pt will grasp objects with R hand and bring to mouth 10/10 trials in prep for self feeding ?Additional ADL Goal #2: Pt will grasp/release 5/5 objects with L hand to increase indep with ADLs  ?Plan Discharge plan remains appropriate   ? ?Co-evaluation ? ? ?   ?  ?  ?  ?  ? ?  ?AM-PAC OT "6 Clicks" Daily Activity     ?Outcome Measure ? ? Help from another person eating meals?: A Little ?Help from another person taking care of personal grooming?: A Lot ?Help from another person toileting, which includes using toliet, bedpan, or urinal?: A Lot ?Help from another person bathing (including washing, rinsing, drying)?: A Lot ?Help from another person to put on and taking off regular upper body clothing?: A Lot ?Help from another person to put on and taking off regular lower body clothing?: A Lot ?6 Click Score: 13 ? ?  ?End of Session Equipment Utilized During Treatment: Gait belt ? ?OT Visit Diagnosis: Other abnormalities of gait and mobility (R26.89);Muscle weakness (generalized) (M62.81);Feeding difficulties (  R63.3) ?Pain - Right/Left: Left ?Pain - part of body: Shoulder ?  ?Activity Tolerance Patient tolerated treatment well ?  ?Patient Left in chair;with call bell/phone within reach;with chair alarm set ?  ?Nurse Communication Mobility status;Other (comment) (on using U-cuff to allow patient to feed self) ?  ? ?   ? ?Time: [id number] ?OT Time Calculation (min): 36 min ? ?Charges: OT General Charges ?$OT Visit: 1 Visit ?OT Treatments ?$Self Care/Home Management : 23-37 mins ? ?Lodema Hong, OTA ?Acute Rehabilitation Services  ?Pager [phone] ?Office [phone] ? ? ?[facility] ?09/11/2021, 11:22 AM ?

## 2021-09-12 DIAGNOSIS — G9341 Metabolic encephalopathy: Secondary | ICD-10-CM | POA: Diagnosis not present

## 2021-09-12 DIAGNOSIS — D75839 Thrombocytosis, unspecified: Secondary | ICD-10-CM

## 2021-09-12 DIAGNOSIS — A498 Other bacterial infections of unspecified site: Secondary | ICD-10-CM

## 2021-09-12 DIAGNOSIS — D649 Anemia, unspecified: Secondary | ICD-10-CM

## 2021-09-12 DIAGNOSIS — I2601 Septic pulmonary embolism with acute cor pulmonale: Secondary | ICD-10-CM

## 2021-09-12 DIAGNOSIS — G959 Disease of spinal cord, unspecified: Secondary | ICD-10-CM | POA: Diagnosis not present

## 2021-09-12 DIAGNOSIS — J9601 Acute respiratory failure with hypoxia: Secondary | ICD-10-CM | POA: Diagnosis not present

## 2021-09-12 LAB — CBC
HCT: 26.8 % — ABNORMAL LOW (ref 39.0–52.0)
Hemoglobin: 8.4 g/dL — ABNORMAL LOW (ref 13.0–17.0)
MCH: 29.6 pg (ref 26.0–34.0)
MCHC: 31.3 g/dL (ref 30.0–36.0)
MCV: 94.4 fL (ref 80.0–100.0)
Platelets: 358 K/uL (ref 150–400)
RBC: 2.84 MIL/uL — ABNORMAL LOW (ref 4.22–5.81)
RDW: 16.7 % — ABNORMAL HIGH (ref 11.5–15.5)
WBC: 6.9 K/uL (ref 4.0–10.5)
nRBC: 0 % (ref 0.0–0.2)

## 2021-09-12 LAB — COMPREHENSIVE METABOLIC PANEL WITH GFR
ALT: 21 U/L (ref 0–44)
AST: 27 U/L (ref 15–41)
Albumin: 3 g/dL — ABNORMAL LOW (ref 3.5–5.0)
Alkaline Phosphatase: 101 U/L (ref 38–126)
Anion gap: 6 (ref 5–15)
BUN: 22 mg/dL — ABNORMAL HIGH (ref 6–20)
CO2: 28 mmol/L (ref 22–32)
Calcium: 10 mg/dL (ref 8.9–10.3)
Chloride: 99 mmol/L (ref 98–111)
Creatinine, Ser: 0.99 mg/dL (ref 0.61–1.24)
GFR, Estimated: >60 mL/min
Glucose, Bld: 93 mg/dL (ref 70–99)
Potassium: 4.5 mmol/L (ref 3.5–5.1)
Sodium: 133 mmol/L — ABNORMAL LOW (ref 135–145)
Total Bilirubin: 0.5 mg/dL (ref 0.3–1.2)
Total Protein: 7.4 g/dL (ref 6.5–8.1)

## 2021-09-12 LAB — CULTURE, BLOOD (ROUTINE X 2)
Culture: NO GROWTH
Culture: NO GROWTH
Special Requests: ADEQUATE

## 2021-09-12 LAB — GLUCOSE, CAPILLARY
Glucose-Capillary: 120 mg/dL — ABNORMAL HIGH (ref 70–99)
Glucose-Capillary: 127 mg/dL — ABNORMAL HIGH (ref 70–99)
Glucose-Capillary: 102 mg/dL — ABNORMAL HIGH (ref 70–99)
Glucose-Capillary: 100 mg/dL — ABNORMAL HIGH (ref 70–99)
Glucose-Capillary: 98 mg/dL (ref 70–99)
Glucose-Capillary: 123 mg/dL — ABNORMAL HIGH (ref 70–99)

## 2021-09-12 LAB — MAGNESIUM: Magnesium: 1.9 mg/dL (ref 1.7–2.4)

## 2021-09-12 NOTE — Progress Notes (Addendum)
Read, reviewed, edited and agree with student's findings and recommendations. Spent extensive time educating patient on his spinal cord injury and how the spinal cord works in addition to how to manage the spasms and what the baclofen is intended to in trying to minimize the degree and frequency of spasms. ? ?Kittie Plater, PT, DPT ?Office: [phone] ?Pager: prefer secure chat ? ? ? ?Physical Therapy Treatment ?Patient Details ?Name: Brian [name] ?MRN: [id number] ?DOB: [birth year] ?Today's Date: 09/12/2021 ? ? ?History of Present Illness Pt is a 61 y/o male admitted 06/29/21 following C3-7 ACDF after progressive cervical myelopathy symptoms including ataxia, bil UE and LE shaking and weakness, and loss of function in his hands with subsequent fall. Pt initially had improvement in neurological symptoms, but postoperative course was complicated by dysphasia and ataxia secondary to and epidural hematoma with evacuation of hematoma on 2/7. Course complicated by delirium secondary to encephalopathy, sepsis secondary to aspiration PNA, reintubated 2/19-2/26 for airway protection, bilat segmental PEs 2/18. PEG placed 3/6. PMHx includes tobacco and EtOH use disorder. ? ?  ?PT Comments  ? ? Continued progression of functional mobility and transfer ability today. The pt continues to have muscle spasms that limit his sleep and mobility, PT noted improvement from previous days session. He was able to perform lateral scooting and bed mobility min A but required mod-max A for stability during step pivot transfer to chair. He shows continued improvement with his STS ability requiring only min A for support once standing. He is also showing improved LE control step pivot practice needing min-mod A for support for trunk stability. Session was limited by overall fatigue and muscle spasms. Pt would benefit from continued PT services to maximize independence for his transfer ability, UE/LE coordination, and functional strength, and  balance. Plan and d/c recommendations remain unchanged. PT to follow up acutely as able.  ?  ?Recommendations for follow up therapy are one component of a multi-disciplinary discharge planning process, led by the attending physician.  Recommendations may be updated based on patient status, additional functional criteria and insurance authorization. ? ?Follow Up Recommendations ? Acute inpatient rehab (3hours/day) ?  ?  ?Assistance Recommended at Discharge Frequent or constant Supervision/Assistance  ?Patient can return home with the following Two people to help with walking and/or transfers;A lot of help with bathing/dressing/bathroom;Assistance with cooking/housework;Assistance with feeding;Direct supervision/assist for medications management;Direct supervision/assist for financial management;Assist for transportation;Help with stairs or ramp for entrance ?  ?Equipment Recommendations ? Wheelchair (measurements PT);Wheelchair cushion (measurements PT);Hospital bed;BSC/3in1 (tilt in space power w/c with appropriate cushion, Roho most likely, drop arm BSC)  ?  ?Recommendations for Other Services Rehab consult ? ? ?  ?Precautions / Restrictions Precautions ?Precautions: Fall ?Precaution Booklet Issued: No ?Restrictions ?Weight Bearing Restrictions: No  ?  ? ?Mobility ? Bed Mobility ?Overal bed mobility: Needs Assistance ?Bed Mobility: Supine to Sit ?  ?  ?Supine to sit: Min assist ?  ?  ?General bed mobility comments: Requires min A to pull to sit from supine, SPT support B UE ?  ? ?Transfers ?Overall transfer level: Needs assistance ?  ?Transfers: Sit to/from Stand ?Sit to Stand: Min assist ?  ?Step pivot transfers: Max assist ?  ?  ?  ?General transfer comment: Min A for STS x 8 for power up and steady, mod to max A for steady and trunk support during step pivot. Tactile cues for weight shifting but pt initiates movement in LE. ?  ? ?Ambulation/Gait ?  ?  ?  ?  ?  ?  ?  ?  ? ? ?  Stairs ?  ?  ?  ?  ?  ? ? ?Wheelchair  Mobility ?  ? ?Modified Rankin (Stroke Patients Only) ?Modified Rankin (Stroke Patients Only) ?Pre-Morbid Rankin Score: No symptoms ?Modified Rankin: Severe disability ? ? ?  ?Balance Overall balance assessment: Independent ?Sitting-balance support: No upper extremity supported, Feet supported ?Sitting balance-Leahy Scale: Fair ?Sitting balance - Comments: Able to maintain sitting balance while donning ted hoes and shoes ?  ?Standing balance support: Bilateral upper extremity supported, During functional activity ?Standing balance-Leahy Scale: Poor ?Standing balance comment: requires external support ?  ?  ?  ?  ?  ?  ?  ?  ?  ?  ?  ?  ? ?  ?Cognition Arousal/Alertness: Awake/alert ?Behavior During Therapy: [facility] for tasks assessed/performed ?Overall Cognitive Status: No family/caregiver present to determine baseline cognitive functioning ?  ?  ?  ?  ?  ?  ?  ?  ?  ?  ?  ?  ?  ?  ?  ?  ?General Comments: Pt agreeable with plan to further progress functional mobility to promote independence ?  ?  ? ?  ?Exercises Other Exercises ?Other Exercises: STS plus Lateral step pivot to RW. PT pulls up on rail and perfroms B 2x step pivot to RW, min-mod A for trunk support and sequencing cues. ?Other Exercises: STS x 3 with push up from chair with controlled ecentric lower. Min-mod A for trunk support during stand. ? ?  ?General Comments General comments (skin integrity, edema, etc.): Donned shoes in chair, still having spasms today in UE/LE throughout session. Pt states these just come on and he has no warning. ?  ?  ? ?Pertinent Vitals/Pain Pain Assessment ?Faces Pain Scale: Hurts whole lot ?Pain Location: spasm t/o UEs and LEs causing severe pain ?Pain Descriptors / Indicators: Grimacing, Spasm ?Pain Intervention(s): Limited activity within patient's tolerance, Monitored during session  ? ? ?Home Living   ?  ?  ?  ?  ?  ?  ?  ?  ?  ?   ?  ?Prior Function    ?  ?  ?   ? ?PT Goals (current goals can now be found in the care plan  section) Acute Rehab PT Goals ?Patient Stated Goal: home ?PT Goal Formulation: With patient ?Time For Goal Achievement: 09/17/21 ?Potential to Achieve Goals: Good ?Progress towards PT goals: Progressing toward goals ? ?  ?Frequency ? ? ? Min 5X/week ? ? ? ?  ?PT Plan Current plan remains appropriate  ? ? ?Co-evaluation   ?  ?  ?  ?  ? ?  ?AM-PAC PT "6 Clicks" Mobility   ?Outcome Measure ? Help needed turning from your back to your side while in a flat bed without using bedrails?: A Little ?Help needed moving from lying on your back to sitting on the side of a flat bed without using bedrails?: A Little ?Help needed moving to and from a bed to a chair (including a wheelchair)?: A Little ?Help needed standing up from a chair using your arms (e.g., wheelchair or bedside chair)?: A Lot ?Help needed to walk in hospital room?: A Lot ?Help needed climbing 3-5 steps with a railing? : Total ?6 Click Score: 14 ? ?  ?End of Session Equipment Utilized During Treatment: Gait belt ?Activity Tolerance: Other (comment) (limited by fatigue and muscle spasms.) ?Patient left: with call bell/phone within reach;in chair;with chair alarm set ?Nurse Communication: Mobility status ?PT Visit Diagnosis: Muscle weakness (generalized) (  M62.81);Other symptoms and signs involving the nervous system (R29.898);Difficulty in walking, not elsewhere classified (R26.2);Other abnormalities of gait and mobility (R26.89) ?  ? ? ?Time: [id number] ?PT Time Calculation (min) (ACUTE ONLY): 53 min ? ?Charges:  $Gait Training: 8-22 mins ?$Therapeutic Exercise: 8-22 mins ?$Neuromuscular Re-education: 8-22 mins          ?          ? ?Thermon Leyland, SPT ?Acute Rehab Services ? ? ? ?Thermon Leyland ?09/12/2021, 1:39 PM ? ?

## 2021-09-12 NOTE — Plan of Care (Signed)
?  Problem: Education: ?Goal: Ability to verbalize activity precautions or restrictions will improve ?Outcome: Progressing ?Goal: Knowledge of the prescribed therapeutic regimen will improve ?Outcome: Progressing ?Goal: Understanding of discharge needs will improve ?Outcome: Progressing ?  ?Problem: Activity: ?Goal: Ability to avoid complications of mobility impairment will improve ?Outcome: Progressing ?Goal: Ability to tolerate increased activity will improve ?Outcome: Progressing ?Goal: Will remain free from falls ?Outcome: Progressing ?  ?Problem: Bowel/Gastric: ?Goal: Gastrointestinal status for postoperative course will improve ?Outcome: Progressing ?  ?Problem: Clinical Measurements: ?Goal: Ability to maintain clinical measurements within normal limits will improve ?Outcome: Progressing ?Goal: Postoperative complications will be avoided or minimized ?Outcome: Progressing ?Goal: Diagnostic test results will improve ?Outcome: Progressing ?  ?Problem: Pain Management: ?Goal: Pain level will decrease ?Outcome: Progressing ?  ?Problem: Skin Integrity: ?Goal: Will show signs of wound healing ?Outcome: Progressing ?  ?Problem: Health Behavior/Discharge Planning: ?Goal: Identification of resources available to assist in meeting health care needs will improve ?Outcome: Progressing ?  ?Problem: Bladder/Genitourinary: ?Goal: Urinary functional status for postoperative course will improve ?Outcome: Progressing ?  ?Problem: Safety: ?Goal: Ability to remain free from injury will improve ?Outcome: Progressing ?  ?Problem: Education: ?Goal: Knowledge of General Education information will improve ?Description: Including pain rating scale, medication(s)/side effects and non-pharmacologic comfort measures ?Outcome: Progressing ?  ?Problem: Health Behavior/Discharge Planning: ?Goal: Ability to manage health-related needs will improve ?Outcome: Progressing ?  ?Problem: Clinical Measurements: ?Goal: Ability to maintain clinical  measurements within normal limits will improve ?Outcome: Progressing ?Goal: Will remain free from infection ?Outcome: Progressing ?Goal: Diagnostic test results will improve ?Outcome: Progressing ?Goal: Respiratory complications will improve ?Outcome: Progressing ?Goal: Cardiovascular complication will be avoided ?Outcome: Progressing ?  ?Problem: Activity: ?Goal: Risk for activity intolerance will decrease ?Outcome: Progressing ?  ?Problem: Coping: ?Goal: Level of anxiety will decrease ?Outcome: Progressing ?  ?Problem: Elimination: ?Goal: Will not experience complications related to urinary retention ?Outcome: Progressing ?  ?Problem: Pain Managment: ?Goal: General experience of comfort will improve ?Outcome: Progressing ?  ?Problem: Safety: ?Goal: Ability to remain free from injury will improve ?Outcome: Progressing ?  ?Problem: Skin Integrity: ?Goal: Risk for impaired skin integrity will decrease ?Outcome: Progressing ?  ?

## 2021-09-12 NOTE — Progress Notes (Signed)
? ?  Providing Compassionate, Quality Care - Together ? ? ?Subjective: ?Patient reports he is still having muscle "cramping," but they are much better than before starting the baclofen. ? ?Objective: ?Vital signs in last 24 hours: ?Temp:  [97.5 ?F (36.4 ?C)-98.7 ?F (37.1 ?C)] 97.8 ?F (36.6 ?C) (04/19 1217) ?Pulse Rate:  [70-127] 111 (04/19 1217) ?Resp:  [16-20] 20 (04/19 1217) ?BP: (92-117)/(67-86) 108/74 (04/19 1217) ?SpO2:  [100 %] 100 % (04/19 1217) ? ?Intake/Output from previous day: ?04/18 0701 - 04/19 0700 ?In: 525 [NG/GT:525] ?Out: 2050 [Urine:400; Drains:1650] ?Intake/Output this shift: ?Total I/O ?In: -  ?Out: 1000 [Urine:1000] ? ?Alert; oriented to person, place, and time ?MAE, Generalized weakness secondary to deconditioning ?Decreased fine motor BUE ?Incision is well-healed ? ?Lab Results: ?Recent Labs  ?  09/10/21 ?[id number] 09/12/21 ?0305  ?WBC 4.7 6.9  ?HGB 8.7* 8.4*  ?HCT 26.9* 26.8*  ?PLT 390 358  ? ?BMET ?Recent Labs  ?  09/10/21 ?[id number] 09/12/21 ?0305  ?NA 135 133*  ?K 4.3 4.5  ?CL 102 99  ?CO2 27 28  ?GLUCOSE 101* 93  ?BUN 26* 22*  ?CREATININE 0.72 0.99  ?CALCIUM 9.8 10.0  ? ? ?Studies/Results: ?No results found. ? ?Assessment/Plan: ?Patient status post C3-4, C4-5, C5-6, C6-7 anterior cervical discectomy with interbody fusion by Dr. Annette Stable on 06/29/2021. Increased difficulty swallowing with lung atelectasis and elevated temperatures on 07/02/2021. Made NPO following MBS by SLP. 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 CTA head and neck negative for stroke. MRI revealed epidural hematoma. Patient underwent exploration of his cervical fusion with evacuation of the epidural hematoma on 07/03/2021. Cortrak was placed on 07/04/2021. Patient's strength much improved since epidural hematoma evacuation. Cortrak removed 07/06/2021 and dysphagia 2 diet with nectar thick liquids started. Patient with delirium vs ETOH withdrawal. Discontinued steroids 07/06/2021. CIWA protocol  started and discontinued 07/07/2021. CT and MRI 07/07/2021 negative. Patient developed respiratory distress, requiring intubation on 07/15/2021. Work up revealed aspiration PNA and PE. Patient was extubated on 07/22/2021. Foley catheter removed 07/24/2021. Patient transferred to 3W on 07/26/2021. Foley replaced 07/28/2021. PEG placed 07/30/2021. Foley removed 08/09/2021. PEG pulled out by patient 08/16/2021. Delirium worsening 08/17/2021. Haldol administered. IR replaced PEG on 08/17/2021. Patient with urosepsis 08/18/2021. Culture grew E. Coli. C/o LBP 08/28/2021. Lumbar MRI demonstrated chronic degenerative changes with spinal stenosis at L3-4, L4-5, and L5-S1. Patient suffered a fall overnight 08/29/2021 without injury. Worsening muscle spasms 09/11/2021, baclofen started. ? ? LOS: 75 days  ? ?-Continue efforts at rehabilitation. ?-Goal is for SNF placement. ?  ? ? ?Viona Gilmore, DNP, AGNP-C ?Nurse Practitioner ? ?[facility] ?[address], [place], [place] [address] ?[id number]    [id number] ? ?09/12/2021, 1:25 PM ? ? ? ? ?

## 2021-09-12 NOTE — Progress Notes (Addendum)
?PROGRESS NOTE ? ? ? ?[name]  [id number] DOB: 09/15/1960 DOA: 06/29/2021 ?PCP: Pcp, No  ? 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  He was admitted on 2/3 and underwent ACDF of C3-4, C4-5, C5-5, and C6-7.  Post-operatively he was progressing slowly with residual weakness in both hands but improving lower extremity strength and function.  He developed some difficulty swallowing on 2/5 and SLP ordered and placed on thickened liquids.   Overnight 2/6, patient with increased difficulty swallowing and was made NPO but also noted to have dysarthria and difficulty moving extremities with numbness.  SLP evaluated and found to be moderate aspiration risk.   Also progressively tachycardic, tachypneic, and now febrile.  Code stroke activated and taken for CT/ CTA head and neck and was given decadron 10mg  once.  NIHSS 18.  CTH was negative for acute findings, and CTA head neck did not reveal any LVO but noted significant prevertebral soft tissue swelling with foci of gas present at the ventral epidural space at C4-5, small collection not excluded. On 2/7, patient with worsening confusion and concern for airway involvement, PCCM consulted and remained in the intensive care unit until he is transferred to the floor on 3/2 and PCCM requested transition to [facility] for medical assistance while patient remains under the neurosurgery service. ? ?Significant Hospital Events: ?2/3 ACDF C3-4, C4-5, C5-5, and C6-7 w/ Dr. Annette Stable ?2/6 SLP eval for difficulty swallowing ?2/7 Code stroke overnight, neg for LVO, CT showing soft tissue swelling.  PCCM consulted for concern of airway management  and AMS.  Went for evacuation of hematoma  ?2/18 CT Abd/Pelvis: showing bilateral segmental Pes, started on heparin and showing worsening pneumonia, febrile to  103.  PCT rising, 34.6, restarted on abx ?2/19 possible aspiration w/ severe hypoxia; intubated; Switching from heparin to angiomax given subtherapeutic levels despite increase in rate. ?2/20 Echo shows normal LV systolic function. There was notation of a + McConnell's sign c/w a large pulmonary embolus which is consistent with the finding of bilateral segmental pulmonary emboli noted on CT 07/14/2021. ?2/21 rash appreciated on back; stopped cefepim/vanc switched to zosyn; required low dose levo with increase in fentanyl ?2/22 remains intubated; unable to extubate to do increase RR and thick secretions ?2/23 Ketamine infusion added ?2/24: weaning fentanyl.  ?2/26 extubated ?2/28 tachycardia persists ?3/2: Modified barium swallow, continues n.p.o. with core track in place, likely will need PEG tube. ?3/6: s/p IR G-tube placement ?3/16: Foley catheter discontinued ?3/24: E. coli UTI/bacteremia>> cefazolin x7 days per ID ?4/5-4/6 Had a fall overnight and fell out of the bed and was unwitnessed but patient states he rolled out of the bed and did not get hurt. Has fall precautions now. ?4/59fell while trying to reach for his eyeglasses, reports landed on left knee, reports it is a soft fall, denies left knee pain, denies hit his head ?4/14 urine is cloudy, repeat urine culture + Citrobacter Freundii that is Sensitive to Bactrim,  ?4/16 reinserted foley due to recurrent urinary retention  ?medically clear awaiting SNF placement; difficult to place per TOC. ? ?Assessment and Plan: ?* Cervical myelopathy ([name]); severe cervical spinal stenosis with cord compression s/p ACDF [id number] on 2/3, complicated by  epidural hematoma s/p evacuation 2/7 ?-Patient initially presented s/p recent fall with progressive upper and lower extremity weakness and tremors, He was found to have  critical multilevel cervical spinal stenosis @ C3-6 with spinal cord compression and spinal cord signal change and underwent ACDF C3-4, C4-5, C5-5, and C6/7 by neurosurgery Dr. Trenton Gammon on 06/29/2021.  Postoperative complicated by epidural hematoma s/p evacuation on 2/7. ?--Continue PT/OT/SLP efforts ?--Awaiting SNF placement per TOC ?--Further as per neurosurgery recommendation. ? ?Acute respiratory failure with hypoxia ([place]) ?=Likely Multifactorial with significant dysphagia and recurrent aspiration pneumonia events during initial hospitalization following ACDF surgery with postoperative complications of postoperative cervical hematoma.   ?-Patient did require ventilatory support in the intensive care unit and was successfully extubated on 07/22/2021.  Patient completed extensive course of empiric antibiotics.   ?-SpO2: 100 % ?O2 Flow Rate (L/min): 0 L/min ?FiO2 (%): 40 %; Oxygen now weaned off, on room air. ?-Continue Aspiration Precautions ? ?Acute pulmonary embolism ([place]) ?-CTA chest showed bilateral segmental PE.  ?-LE [place] negative for DVT.   ?-TTE with normal LV systolic function.  Supplemental oxygen weaned off. ?-C/w Eliquis 5mg  BID ? ?Acute metabolic encephalopathy ?-CT head, MRI brain, EEG, TSH, B12, ammonia unrevealing.  Etiology likely multifactorial with acute respiratory failure secondary to aspiration pneumonia, bilateral pulmonary embolism.  -Completed course of antibiotics for aspiration pneumonia.  Seroquel and clonazepam discontinued.  Completed course of antibiotics for E. coli UTI/septicemia. ?-C/w Supportive care ? ?E. coli bacteremia ?-Urine culture and blood cultures x2 08/17/2028 positive for E. coli.  ?-Evaluated by infectious disease.   ?-Completed 7-day course of antibiotics per ID. ? ?Severe sepsis due to recurrent aspiration pneumonia ?-Completed extensive course of antibiotics while under ICU care. S/p IR gtube 3/6. ?-Continue tube feeds nocturnally  ?-Cleared for regular diet with thin  liquids per SLP; but with very little oral intake per RN; and patient with little desire for oral intake ?-Continue aspiration precautions ?-Continue SLP efforts while inpatient ? ?Sinus tachycardia ?-Multifactorial including sepsis, dehydration, PE, agitation.   ?-Continue Metoprolol 12.5 mg twice daily ?-HR improved and ranging from 70-98 now  ? ?Elevated liver enzymes ?-Etiology likely secondary to sepsis from aspiration pneumonia as above.  Acute hepatitis panel negative.  RUQ ultrasound unremarkable. ?-Continue monitor LFTs intermittently as last Ast was normal at 27 and ALT was 21 ? ?Hyponatremia ?-Etiology likely secondary to dehydration.   ?-Urine sodium low.  Continue tube feeds; now cleared by speech therapy for dysphagia 3 diet. ?-Na+ went from 135 -> 133 ?-Continue to monitor BMP intermittently ? ?Tobacco use disorder ?-Counseled on need for complete cessation.  -Treated with nicotine patch which has now been weaned off. ? ?Physical deconditioning ?-Significant weakness in all extremities as a result of cervical myelopathy and acute illness.  Slowly improving while inpatient. ?-Continue PT/OT efforts ?-Pending SNF placement; difficult placement per TOC ? ?Urinary retention ?-Foley catheter discontinued on 08/09/2021; replaced on 09/09/2021 for recurrent urinary retention.   ?-Etiology likely secondary to poor mobility status. ?-C/w Tamsulosin 0.8 mg p.o. daily ?-C/w Finasteride 5 mg p.o. daily ?-Continue to monitor urinary output; Strict I's and O's and Patient is +3.017 Liters  ? ?Protein-calorie malnutrition, severe ([place]) ?-As evidenced by poor p.o. intake, significant muscle mass and subcu fat loss and significant weight loss (about 16 pounds since this hospitalization). ?Nutrition Problem: Severe Malnutrition (in the context of social/environmental circumstances) ?Etiology:  (inadequate energy intake) ?Signs/Symptoms: mild fat depletion, severe muscle depletion, severe muscle depletion ?Interventions:  Refer to RD note for recommendations  ?--  Dietitian following, appreciate assistance.   ?--s/p IR gtube 3/6.  ?--Now cleared for regular diet with thin liquids ?--Continue tube feeds; transition to nocturn

## 2021-09-12 NOTE — Progress Notes (Signed)
Speech Language Pathology Treatment: Dysphagia  ?Patient Details ?Name: Brian [name] ?MRN: [id number] ?DOB: 13-Oct-1960 ?Today's Date: 09/12/2021 ?Time: 8250-0370 ?SLP Time Calculation (min) (ACUTE ONLY): 14 min ? ?Assessment / Plan / Recommendation ?Clinical Impression ? [name] was in good spirits this afternoon and smiled when SLP arrived. He states he has been using his EMST (expiratory muscle strength trainer device) during the day. Therapist increased the cm H20 pressure but was excessive and pt unable to achieve an adequate exhalation. Decreased trainer to 25 cm H20 which was effective to produce successive trials using trainer. Encouraged to continue use ?Lunch tray in front of him and stated he was not hungry and would eat later. He denied coughing with liquids or solids or having any difficulty swallowing. He was observed today with straw sips water to determine if he could use and swallows appeared safe without concern for aspiration therefore, recommending he use straw if he desires. Continue regular texture, thin liquids. Pt has made excellent progress and is close to being discharged from ST services.  ?   ?HPI HPI: Pt is a 61 y/o admitted 06/29/21 following C3-7 ACDF after progressive cervical myelopathy symptoms with subsequent fall and severe incomplete SCI. Significant prevertebral swelling per imaging. Pt developed some difficulty swallowing on 2/5 and SLP consulted. MBS 2/6 revealed severe edema s/p ACDF, oral holding, a pharyngeal delay, inconsistent hyolaryngeal elevation, incomplete epiglottic inversion due to edema. Aspiration noted accross consistencies and and NPO status was recommended. Repeat MBS 2/10 showed significantly reduced pharyngeal edema and a dysphagia 2 diet with nectar thick liquids was initiated. Pt transitioned to thin liquids 2/16 and susbequently returned to nectar thick 2/17 due to concern for aspiration. Pt developed pna, possible aspiration, requiring intubation  2/19-26. Cortrak placed 2/20. PEG placed in IR on 3/6. No significant PMH. Pt is appropriate for his 4th MBS to determine if he can upgrade to thin liquids. ?  ?   ?SLP Plan ? Continue with current plan of care ? ?  ?  ?Recommendations for follow up therapy are one component of a multi-disciplinary discharge planning process, led by the attending physician.  Recommendations may be updated based on patient status, additional functional criteria and insurance authorization. ?  ? ?Recommendations  ?Diet recommendations: Regular;Thin liquid ?Liquids provided via: Cup;Straw ?Medication Administration: Whole meds with puree ?Supervision:  (may need help with tray set up) ?Compensations: Slow rate;Small sips/bites;Other (Comment) ?Postural Changes and/or Swallow Maneuvers: Seated upright 90 degrees  ?   ?    ?   ? ? ? ? Oral Care Recommendations: Oral care BID ?Follow Up Recommendations: Skilled nursing-short term rehab (<3 hours/day) ?Assistance recommended at discharge: Intermittent Supervision/Assistance ?SLP Visit Diagnosis: Dysphagia, pharyngeal phase (R13.13) ?Plan: Continue with current plan of care ? ? ? ? ?  ?  ? ? ?[name] ? ?09/12/2021, 3:41 PM ?

## 2021-09-12 NOTE — Plan of Care (Signed)
Pt is alert oriented x 4. Pt c/o pain to back and spasms noted to bilateral legs, prn muscle relaxer given. Pt c/o pain PRN tylenol given , not effective. PRN dilaudid given. Pt currently resting, no spasms noted or pain noted. Tube feedings continued per order. Pt was sitting in recliner at the beginning of shift, +1 stand and pivot from recliner to bed. Pt requested cup of ice water, pt drank water with no complications.  ? ? ?Problem: Education: ?Goal: Ability to verbalize activity precautions or restrictions will improve ?Outcome: Progressing ?Goal: Knowledge of the prescribed therapeutic regimen will improve ?Outcome: Progressing ?Goal: Understanding of discharge needs will improve ?Outcome: Progressing ?  ?Problem: Activity: ?Goal: Ability to avoid complications of mobility impairment will improve ?Outcome: Progressing ?Goal: Ability to tolerate increased activity will improve ?Outcome: Progressing ?Goal: Will remain free from falls ?Outcome: Progressing ?  ?Problem: Bowel/Gastric: ?Goal: Gastrointestinal status for postoperative course will improve ?Outcome: Progressing ?  ?Problem: Clinical Measurements: ?Goal: Ability to maintain clinical measurements within normal limits will improve ?Outcome: Progressing ?Goal: Postoperative complications will be avoided or minimized ?Outcome: Progressing ?Goal: Diagnostic test results will improve ?Outcome: Progressing ?  ?Problem: Pain Management: ?Goal: Pain level will decrease ?Outcome: Progressing ?  ?Problem: Skin Integrity: ?Goal: Will show signs of wound healing ?Outcome: Progressing ?  ?Problem: Health Behavior/Discharge Planning: ?Goal: Identification of resources available to assist in meeting health care needs will improve ?Outcome: Progressing ?  ?Problem: Bladder/Genitourinary: ?Goal: Urinary functional status for postoperative course will improve ?Outcome: Progressing ?  ?Problem: Safety: ?Goal: Ability to remain free from injury will improve ?Outcome:  Progressing ?  ?Problem: Education: ?Goal: Knowledge of General Education information will improve ?Description: Including pain rating scale, medication(s)/side effects and non-pharmacologic comfort measures ?Outcome: Progressing ?  ?Problem: Health Behavior/Discharge Planning: ?Goal: Ability to manage health-related needs will improve ?Outcome: Progressing ?  ?Problem: Clinical Measurements: ?Goal: Ability to maintain clinical measurements within normal limits will improve ?Outcome: Progressing ?Goal: Will remain free from infection ?Outcome: Progressing ?Goal: Diagnostic test results will improve ?Outcome: Progressing ?Goal: Respiratory complications will improve ?Outcome: Progressing ?Goal: Cardiovascular complication will be avoided ?Outcome: Progressing ?  ?Problem: Activity: ?Goal: Risk for activity intolerance will decrease ?Outcome: Progressing ?  ?Problem: Nutrition: ?Goal: Adequate nutrition will be maintained ?Outcome: Progressing ?  ?Problem: Coping: ?Goal: Level of anxiety will decrease ?Outcome: Progressing ?  ?Problem: Elimination: ?Goal: Will not experience complications related to bowel motility ?Outcome: Progressing ?Goal: Will not experience complications related to urinary retention ?Outcome: Progressing ?  ?Problem: Pain Managment: ?Goal: General experience of comfort will improve ?Outcome: Progressing ?  ?Problem: Safety: ?Goal: Ability to remain free from injury will improve ?Outcome: Progressing ?  ?Problem: Skin Integrity: ?Goal: Risk for impaired skin integrity will decrease ?Outcome: Progressing ?  ?

## 2021-09-13 ENCOUNTER — Inpatient Hospital Stay (HOSPITAL_COMMUNITY): Payer: 59

## 2021-09-13 DIAGNOSIS — G9341 Metabolic encephalopathy: Secondary | ICD-10-CM | POA: Diagnosis not present

## 2021-09-13 DIAGNOSIS — G959 Disease of spinal cord, unspecified: Secondary | ICD-10-CM | POA: Diagnosis not present

## 2021-09-13 DIAGNOSIS — J9601 Acute respiratory failure with hypoxia: Secondary | ICD-10-CM | POA: Diagnosis not present

## 2021-09-13 DIAGNOSIS — R1110 Vomiting, unspecified: Secondary | ICD-10-CM

## 2021-09-13 DIAGNOSIS — I2601 Septic pulmonary embolism with acute cor pulmonale: Secondary | ICD-10-CM | POA: Diagnosis not present

## 2021-09-13 LAB — COMPREHENSIVE METABOLIC PANEL
ALT: 20 U/L (ref 0–44)
AST: 26 U/L (ref 15–41)
Albumin: 3.5 g/dL (ref 3.5–5.0)
Alkaline Phosphatase: 116 U/L (ref 38–126)
Anion gap: 8 (ref 5–15)
BUN: 21 mg/dL — ABNORMAL HIGH (ref 6–20)
CO2: 29 mmol/L (ref 22–32)
Calcium: 10.7 mg/dL — ABNORMAL HIGH (ref 8.9–10.3)
Chloride: 101 mmol/L (ref 98–111)
Creatinine, Ser: 1.11 mg/dL (ref 0.61–1.24)
GFR, Estimated: >60 mL/min (ref >60)
Glucose, Bld: 105 mg/dL — ABNORMAL HIGH (ref 70–99)
Potassium: 4.2 mmol/L (ref 3.5–5.1)
Sodium: 138 mmol/L (ref 135–145)
Total Bilirubin: 1 mg/dL (ref 0.3–1.2)
Total Protein: 8.2 g/dL — ABNORMAL HIGH (ref 6.5–8.1)

## 2021-09-13 LAB — CBC
HCT: 29.1 % — ABNORMAL LOW (ref 39.0–52.0)
Hemoglobin: 9.6 g/dL — ABNORMAL LOW (ref 13.0–17.0)
MCH: 31.1 pg (ref 26.0–34.0)
MCHC: 33 g/dL (ref 30.0–36.0)
MCV: 94.2 fL (ref 80.0–100.0)
Platelets: 386 10*3/uL (ref 150–400)
RBC: 3.09 MIL/uL — ABNORMAL LOW (ref 4.22–5.81)
RDW: 17.2 % — ABNORMAL HIGH (ref 11.5–15.5)
WBC: 6.2 10*3/uL (ref 4.0–10.5)
nRBC: 0 % (ref 0.0–0.2)

## 2021-09-13 LAB — GLUCOSE, CAPILLARY
Glucose-Capillary: 83 mg/dL (ref 70–99)
Glucose-Capillary: 105 mg/dL — ABNORMAL HIGH (ref 70–99)
Glucose-Capillary: 98 mg/dL (ref 70–99)
Glucose-Capillary: 104 mg/dL — ABNORMAL HIGH (ref 70–99)
Glucose-Capillary: 111 mg/dL — ABNORMAL HIGH (ref 70–99)
Glucose-Capillary: 111 mg/dL — ABNORMAL HIGH (ref 70–99)

## 2021-09-13 LAB — MAGNESIUM: Magnesium: 1.9 mg/dL (ref 1.7–2.4)

## 2021-09-13 LAB — PHOSPHORUS: Phosphorus: 4.2 mg/dL (ref 2.5–4.6)

## 2021-09-13 IMAGING — DX DG ABD PORTABLE 1V
1 series · 2 of 2 positions shown · non-contrast
Comparison: [DATE]

CLINICAL DATA: Abdominal distension.

EXAM:
PORTABLE ABDOMEN - 1 VIEW

[Series 1: abdomen · 0.14mm/px · 2 of 2 slices shown]
[im 1/2]
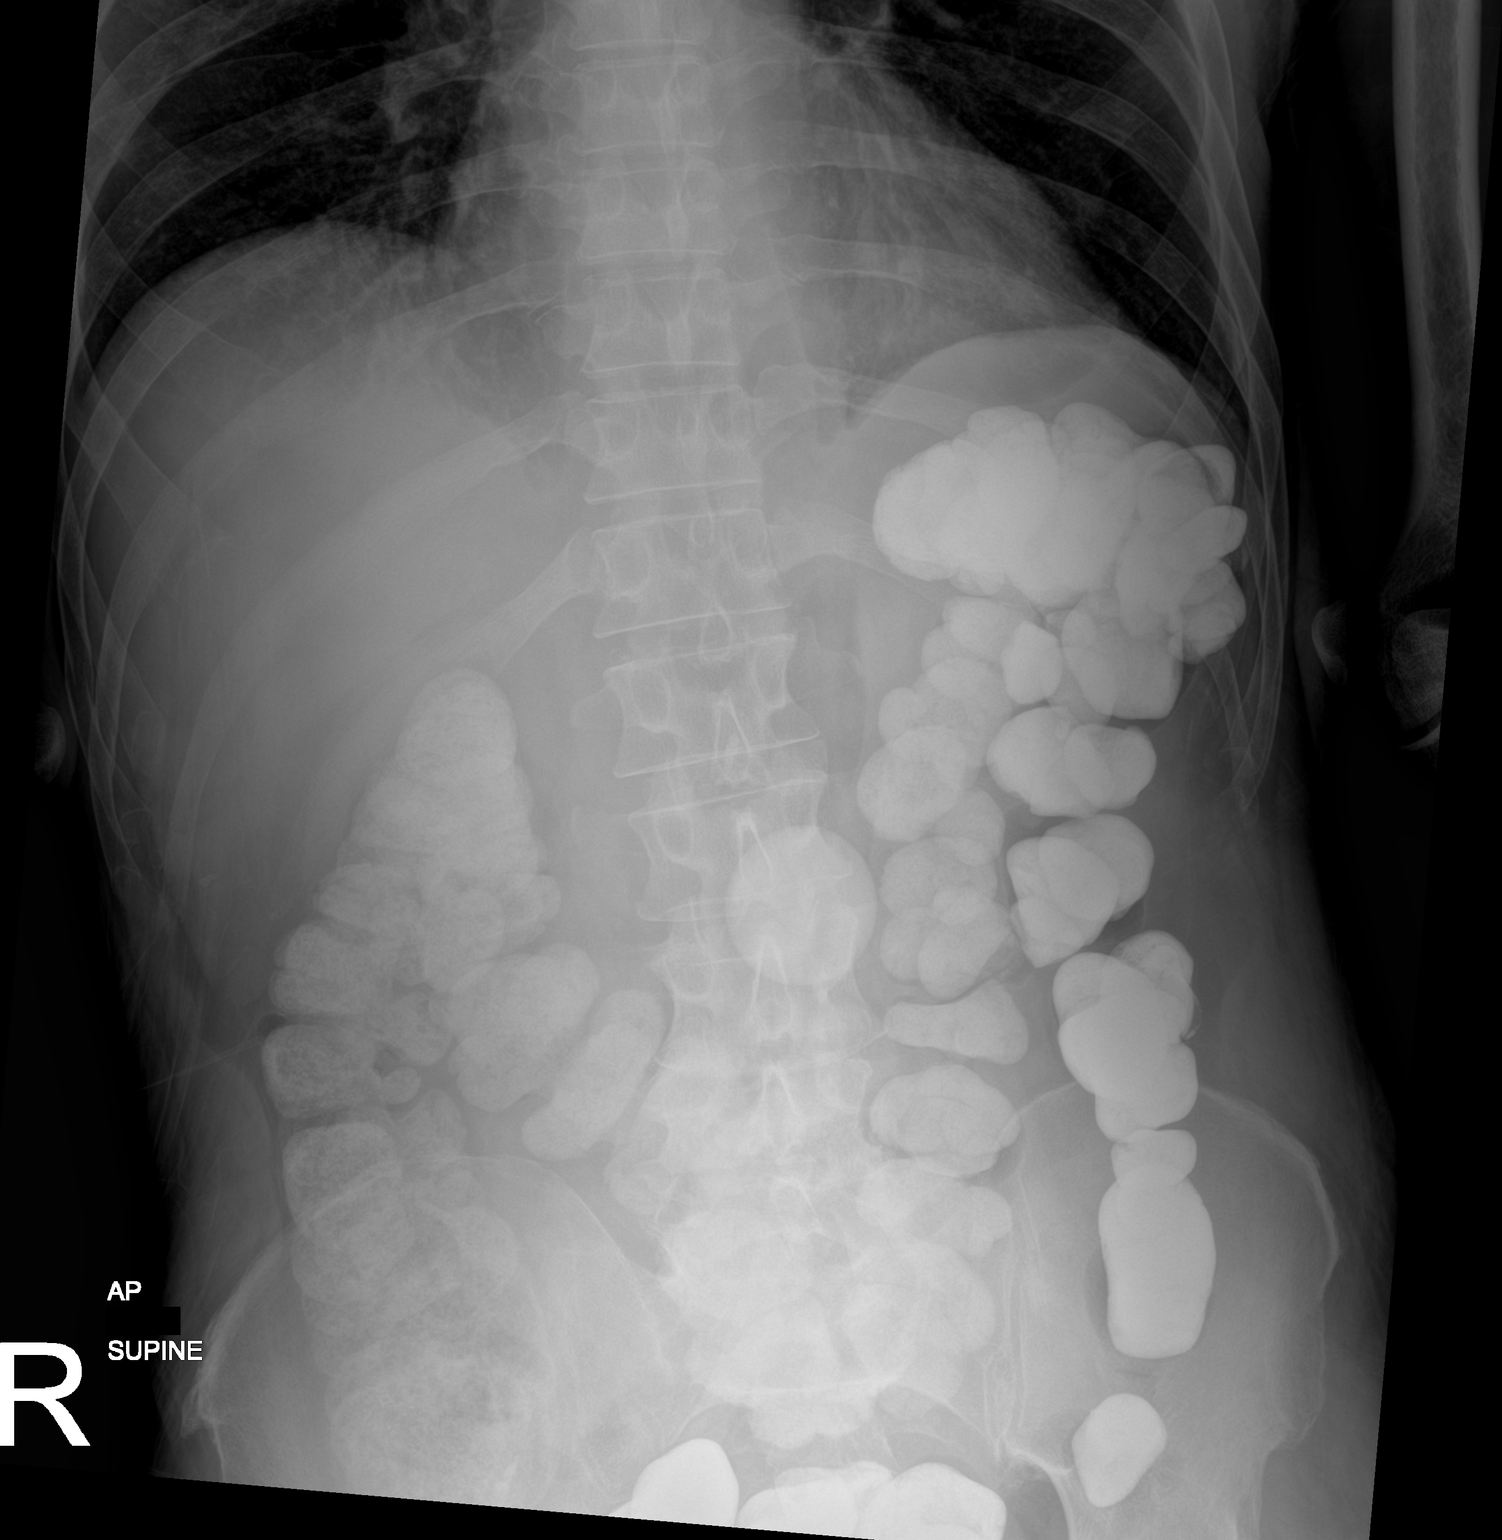
[im 2/2]
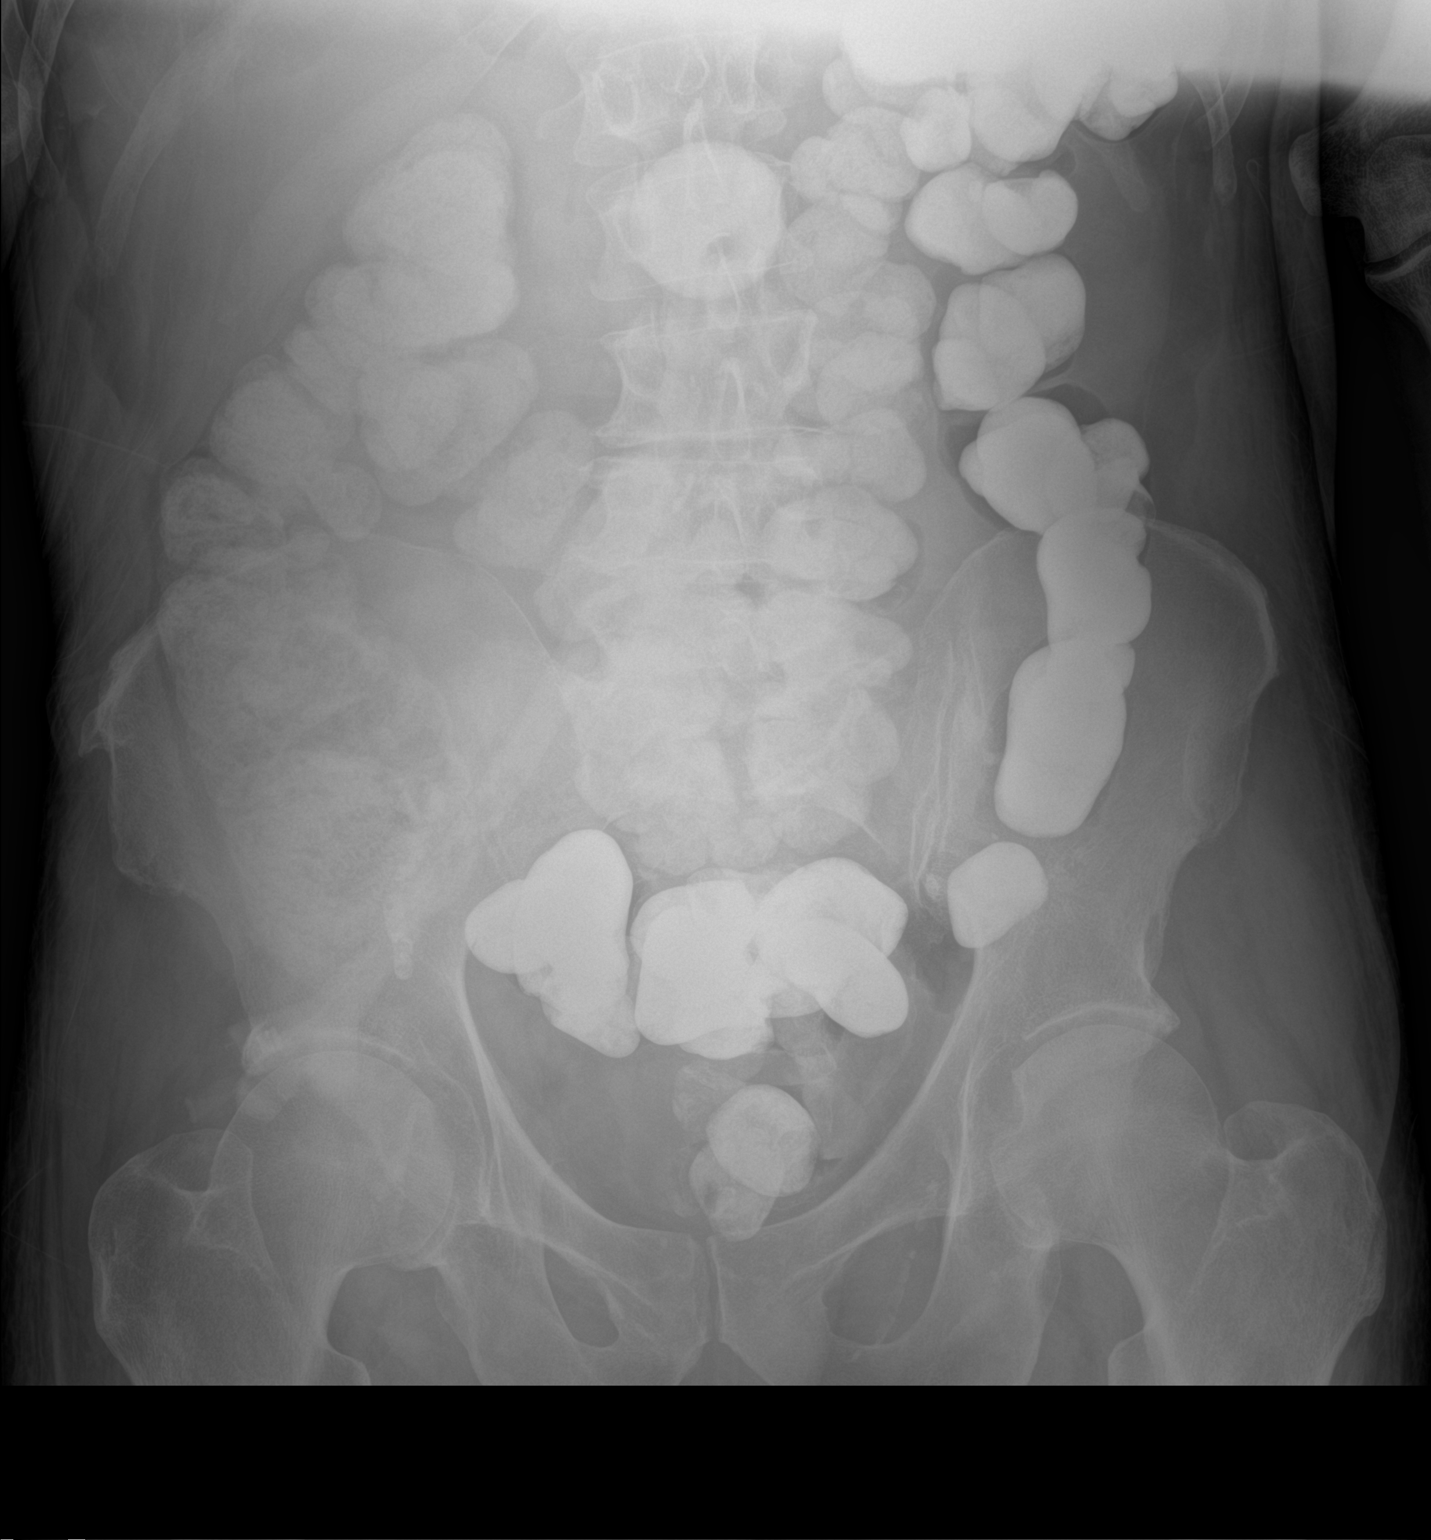

[2 of 2 positions shown; findings below may reference images not displayed]

FINDINGS: Moderate amount of oral contrast material is seen throughout the
colon. No evidence of dilated bowel loops. No evidence of
extraluminal gas collections. No radio-opaque calculi or other
significant radiographic abnormality are seen.
IMPRESSION: Nonobstructive bowel gas pattern. Moderate amount of oral contrast
material throughout the colon.

## 2021-09-13 MED ORDER — KCL IN DEXTROSE-NACL 20-5-0.9 MEQ/L-%-% IV SOLN
INTRAVENOUS | Status: DC
  Filled 2021-09-13 (×2): qty 1000

## 2021-09-13 NOTE — Assessment & Plan Note (Addendum)
-  In the setting of his Nocturnal TF and now IMPROVED ?-C/w Antiemetics ?-Held Nocturnal Feeds have been held intermittently during this hospitalization. ?-Calorie Count stopped however will be resumed as patient accidentally pulled out PEG tube (09/21/2021). ?-KUB done and showed "Moderate amount of oral contrast material is seen throughout the colon. No evidence of dilated bowel loops. No evidence of extraluminal gas collections. No radio-opaque calculi or other significant radiographic abnormality are seen." ?-IVF now discontinued  ?-Patient with improvement with nausea and emesis from 09/20/2021 and as such abdominal films obtained with no acute abnormalities however noted significant stool in the rectum.  -Status post enema. ?-IV antiemetics, supportive care. ?-IF Nausea/Vomiting worsens and ongoing, can consider a CT Abd/Pelvis  ?

## 2021-09-13 NOTE — Progress Notes (Signed)
TRH night cross cover note: ? ?I was notified by RN that the patient is vomiting up his tube feeds. ? ?Per my chart review, including review of most recent rounding hospitalist progress note, the patient was recently started on regular diet with thin liquids at which time his continuous tube feeds were transitioned to continuous at night only.  In this setting, he has been receiving overnight tube feeds at 85 cc/h with every 4 hour flushes of 175 cc/h.  ? ?Around 4 AM this morning (09/13/21), patient vomited approximately 350 cc's, with emesis reportedly resembling tube feed material.  Nonbloody.  Vital signs stable.  Prn IV Zofran administered. ? ?Given the proximity of timing of transition to daytime p.o. intake via regular diet with thin liquids, will hold remainder of this evening's continuous tube feed.  ? ? ? ? ?Babs Bertin, DO ?Hospitalist ? ?

## 2021-09-13 NOTE — Plan of Care (Signed)
0350 pt noted vomiting, tube feeding. Pt vomiting about of tube feeding colored vomit and chunks in it. Assisted pt in brushing teeth and providing oral care. Provider paged to inform. Received order to stop tube feedings. Pt stated he felt like he was getting too much tube feeding. Pt stated he some spaghetti yesterday and also a chocolate boost.  IV nausea medicine given, medication effective.  Pt currently resting.  ? ? ? ? ?Problem: Education: ?Goal: Ability to verbalize activity precautions or restrictions will improve ?Outcome: Progressing ?Goal: Knowledge of the prescribed therapeutic regimen will improve ?Outcome: Progressing ?Goal: Understanding of discharge needs will improve ?Outcome: Progressing ?  ?Problem: Activity: ?Goal: Ability to avoid complications of mobility impairment will improve ?Outcome: Progressing ?Goal: Ability to tolerate increased activity will improve ?Outcome: Progressing ?Goal: Will remain free from falls ?Outcome: Progressing ?  ?Problem: Bowel/Gastric: ?Goal: Gastrointestinal status for postoperative course will improve ?Outcome: Progressing ?  ?Problem: Clinical Measurements: ?Goal: Ability to maintain clinical measurements within normal limits will improve ?Outcome: Progressing ?Goal: Postoperative complications will be avoided or minimized ?Outcome: Progressing ?Goal: Diagnostic test results will improve ?Outcome: Progressing ?  ?Problem: Pain Management: ?Goal: Pain level will decrease ?Outcome: Progressing ?  ?Problem: Skin Integrity: ?Goal: Will show signs of wound healing ?Outcome: Progressing ?  ?Problem: Health Behavior/Discharge Planning: ?Goal: Identification of resources available to assist in meeting health care needs will improve ?Outcome: Progressing ?  ?Problem: Bladder/Genitourinary: ?Goal: Urinary functional status for postoperative course will improve ?Outcome: Progressing ?  ?Problem: Safety: ?Goal: Ability to remain free from injury will  improve ?Outcome: Progressing ?  ?Problem: Education: ?Goal: Knowledge of General Education information will improve ?Description: Including pain rating scale, medication(s)/side effects and non-pharmacologic comfort measures ?Outcome: Progressing ?  ?Problem: Health Behavior/Discharge Planning: ?Goal: Ability to manage health-related needs will improve ?Outcome: Progressing ?  ?Problem: Clinical Measurements: ?Goal: Ability to maintain clinical measurements within normal limits will improve ?Outcome: Progressing ?Goal: Will remain free from infection ?Outcome: Progressing ?Goal: Diagnostic test results will improve ?Outcome: Progressing ?Goal: Respiratory complications will improve ?Outcome: Progressing ?Goal: Cardiovascular complication will be avoided ?Outcome: Progressing ?  ?Problem: Activity: ?Goal: Risk for activity intolerance will decrease ?Outcome: Progressing ?  ?Problem: Nutrition: ?Goal: Adequate nutrition will be maintained ?Outcome: Progressing ?  ?Problem: Coping: ?Goal: Level of anxiety will decrease ?Outcome: Progressing ?  ?Problem: Elimination: ?Goal: Will not experience complications related to bowel motility ?Outcome: Progressing ?Goal: Will not experience complications related to urinary retention ?Outcome: Progressing ?  ?Problem: Safety: ?Goal: Ability to remain free from injury will improve ?Outcome: Progressing ?  ?Problem: Pain Managment: ?Goal: General experience of comfort will improve ?Outcome: Progressing ?  ?Problem: Skin Integrity: ?Goal: Risk for impaired skin integrity will decrease ?Outcome: Progressing ?  ?Problem: Safety: ?Goal: Ability to remain free from injury will improve ?Outcome: Progressing ?  ?

## 2021-09-13 NOTE — Progress Notes (Signed)
Physical Therapy Treatment ?Patient Details ?Name: Brian [name] ?MRN: [id number] ?DOB: 1960/06/26 ?Today's Date: 09/13/2021 ? ? ?History of Present Illness Pt is a 61 y/o male admitted 06/29/21 following C3-7 ACDF after progressive cervical myelopathy symptoms including ataxia, bil UE and LE shaking and weakness, and loss of function in his hands with subsequent fall. Pt initially had improvement in neurological symptoms, but postoperative course was complicated by dysphasia and ataxia secondary to and epidural hematoma with evacuation of hematoma on 2/7. Course complicated by delirium secondary to encephalopathy, sepsis secondary to aspiration PNA, reintubated 2/19-2/26 for airway protection, bilat segmental PEs 2/18. PEG placed 3/6. PMHx includes tobacco and EtOH use disorder. ? ?  ?PT Comments  ? ? Focus of session today was repeated transfer to varying surfaces and continued STS practice. The pt was able to perform a lateral scoot transfer min guard to w/c, squat pivot transfer min A, and step pivot transfers min A for boost up and truncal stability. The pt continues to requiring cuing for hand placement during transfers. He was also able to do multiple STS with sequencing cues and min A for power up and steady. He is limited by UE function, LE sensation, coordination, and strength, overall functional balance, and transfer ability. Pt would benefit from continued PT services to maximize independence and further progress his transfer and strength training. Plan and d/c recommendations remain unchanged. PT to follow up acutely as able.  ?  ?Recommendations for follow up therapy are one component of a multi-disciplinary discharge planning process, led by the attending physician.  Recommendations may be updated based on patient status, additional functional criteria and insurance authorization. ? ?Follow Up Recommendations ? Acute inpatient rehab (3hours/day) ?  ?  ?Assistance Recommended at Discharge Frequent or  constant Supervision/Assistance  ?Patient can return home with the following Two people to help with walking and/or transfers;A lot of help with bathing/dressing/bathroom;Assistance with cooking/housework;Assistance with feeding;Direct supervision/assist for medications management;Direct supervision/assist for financial management;Assist for transportation;Help with stairs or ramp for entrance ?  ?Equipment Recommendations ? Wheelchair (measurements PT);Wheelchair cushion (measurements PT);Hospital bed;BSC/3in1 (tilt in space power w/c with appropriate cushion, Roho most likely, drop arm BSC)  ?  ?Recommendations for Other Services Rehab consult ? ? ?  ?Precautions / Restrictions Precautions ?Precautions: Fall ?Restrictions ?Weight Bearing Restrictions: No  ?  ? ?Mobility ? Bed Mobility ?Overal bed mobility: Needs Assistance ?Bed Mobility: Supine to Sit ?  ?  ?Supine to sit: Supervision ?  ?  ?General bed mobility comments: Pt able to come sit EOB without A ?  ? ?Transfers ?Overall transfer level: Needs assistance ?  ?Transfers: Bed to chair/wheelchair/BSC, Sit to/from Stand ?Sit to Stand: Min assist ?  ?Step pivot transfers: Min assist ?Squat pivot transfers: Min guard, Min assist ?  ? Lateral/Scoot Transfers: Min guard, Min assist ?General transfer comment: Repeated transfer practice, pt at times requiring min A for power up, mostly min guard for saftey and to prevent anterior translation. Min A for power up and steady for STS. Repeated transfer (6 total bed to w/c, w/c to bench x4, w/c to chair) ?  ? ? ? ?Modified Rankin (Stroke Patients Only) ?Modified Rankin (Stroke Patients Only) ?Pre-Morbid Rankin Score: No symptoms ?Modified Rankin: Severe disability ? ? ?  ?Balance Overall balance assessment: Independent ?Sitting-balance support: No upper extremity supported, Feet supported ?Sitting balance-Leahy Scale: Fair ?Sitting balance - Comments: Able to sit EOB and donn shorts ?  ?Standing balance support: Bilateral  upper extremity supported, During functional  activity ?Standing balance-Leahy Scale: Poor ?Standing balance comment: requires external support ?  ?  ?  ?  ?  ?  ?  ?  ?  ?  ?  ?  ? ?  ?Cognition Arousal/Alertness: Awake/alert ?Behavior During Therapy: [facility] for tasks assessed/performed ?Overall Cognitive Status: No family/caregiver present to determine baseline cognitive functioning ?Area of Impairment: Safety/judgement ?  ?  ?  ?  ?  ?  ?  ?  ?  ?  ?  ?  ?Safety/Judgement: Decreased awareness of safety ?  ?  ?General Comments: Pt has to be cued not to scoot too far forward during transfers ?  ?  ? ?  ?   ?General Comments General comments (skin integrity, edema, etc.): Donned shorts in bed, did session outside using bench for repeated transfer. ?  ?  ? ?Pertinent Vitals/Pain Pain Assessment ?Pain Assessment: Faces ?Faces Pain Scale: Hurts little more ?Pain Location: spasms to BUE/BLEs ?Pain Descriptors / Indicators: Grimacing, Spasm ?Pain Intervention(s): Limited activity within patient's tolerance, Monitored during session  ? ? ? ?PT Goals (current goals can now be found in the care plan section) Acute Rehab PT Goals ?Patient Stated Goal: home ?PT Goal Formulation: With patient ?Time For Goal Achievement: 09/17/21 ?Potential to Achieve Goals: Good ?Progress towards PT goals: Progressing toward goals ? ?  ?Frequency ? ? ? Min 5X/week ? ? ? ?  ?PT Plan Current plan remains appropriate  ? ? ?   ?AM-PAC PT "6 Clicks" Mobility   ?Outcome Measure ? Help needed turning from your back to your side while in a flat bed without using bedrails?: A Little ?Help needed moving from lying on your back to sitting on the side of a flat bed without using bedrails?: A Little ?Help needed moving to and from a bed to a chair (including a wheelchair)?: A Little ?Help needed standing up from a chair using your arms (e.g., wheelchair or bedside chair)?: A Lot ?Help needed to walk in hospital room?: A Lot ?Help needed climbing 3-5 steps with  a railing? : Total ?6 Click Score: 14 ? ?  ?End of Session Equipment Utilized During Treatment: Gait belt ?Activity Tolerance: Patient tolerated treatment well ?Patient left: with call bell/phone within reach;in chair;with chair alarm set ?Nurse Communication: Mobility status ?PT Visit Diagnosis: Muscle weakness (generalized) (M62.81);Other symptoms and signs involving the nervous system (R29.898);Difficulty in walking, not elsewhere classified (R26.2);Other abnormalities of gait and mobility (R26.89) ?  ? ? ?Time: [id number] ?PT Time Calculation (min) (ACUTE ONLY): 54 min ? ?Charges:  $Therapeutic Activity: 23-37 mins ?$Neuromuscular Re-education: 8-22 mins          ?          ? ?Thermon Leyland, SPT ?Acute Rehab Services ? ? ? ?Thermon Leyland ?09/13/2021, 4:14 PM ? ?

## 2021-09-13 NOTE — Progress Notes (Signed)
? ?  Providing Compassionate, Quality Care - Together ? ? ?Subjective: ?Patient reports vomiting overnight. He feels "rough" this morning. He is very tearful during today's assessment. He is worried about losing his home as he has been in the hospital for nearly three months now. ? ?Objective: ?Vital signs in last 24 hours: ?Temp:  [97.8 ?F (36.6 ?C)-98.8 ?F (37.1 ?C)] 98.8 ?F (37.1 ?C) (04/20 [id number]) ?Pulse Rate:  [88-111] 99 (04/20 0803) ?Resp:  [18-20] 20 (04/20 0803) ?BP: (103-111)/(72-82) 110/79 (04/20 0803) ?SpO2:  [99 %-100 %] 100 % (04/20 0803) ?Weight:  [55.4 kg] 55.4 kg (04/20 0500) ? ?Intake/Output from previous day: ?04/19 0701 - 04/20 0700 ?In: -  ?Out: 3200 [Urine:2850; Emesis/NG output:350] ?Intake/Output this shift: ?Total I/O ?In: 5 [P.O.:60] ?Out: -  ? ?Alert; oriented to person, place, and time ?MAE, Generalized weakness ?Decreased fine motor BUE ?Abdominal distension; positive bowel sounds ?Incision is well-healed ? ?Lab Results: ?Recent Labs  ?  09/12/21 ?0305  ?WBC 6.9  ?HGB 8.4*  ?HCT 26.8*  ?PLT 358  ? ?BMET ?Recent Labs  ?  09/12/21 ?0305  ?NA 133*  ?K 4.5  ?CL 99  ?CO2 28  ?GLUCOSE 93  ?BUN 22*  ?CREATININE 0.99  ?CALCIUM 10.0  ? ? ?Studies/Results: ?No results found. ? ?Assessment/Plan: ?Patient status post C3-4, C4-5, C5-6, C6-7 anterior cervical discectomy with interbody fusion by Dr. Annette Stable on 06/29/2021. Increased difficulty swallowing with lung atelectasis and elevated temperatures on 07/02/2021. Made NPO following MBS by SLP. 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 CTA head and neck negative for stroke. MRI revealed epidural hematoma. Patient underwent exploration of his cervical fusion with evacuation of the epidural hematoma on 07/03/2021. Cortrak was placed on 07/04/2021. Patient's strength much improved since epidural hematoma evacuation. Cortrak removed 07/06/2021 and dysphagia 2 diet with nectar thick liquids started. Patient with  delirium vs ETOH withdrawal. Discontinued steroids 07/06/2021. CIWA protocol started and discontinued 07/07/2021. CT and MRI 07/07/2021 negative. Patient developed respiratory distress, requiring intubation on 07/15/2021. Work up revealed aspiration PNA and PE. Patient was extubated on 07/22/2021. Foley catheter removed 07/24/2021. Patient transferred to 3W on 07/26/2021. Foley replaced 07/28/2021. PEG placed 07/30/2021. Foley removed 08/09/2021. PEG pulled out by patient 08/16/2021. Delirium worsening 08/17/2021. Haldol administered. IR replaced PEG on 08/17/2021. Patient with urosepsis 08/18/2021. Culture grew E. Coli. C/o LBP 08/28/2021. Lumbar MRI demonstrated chronic degenerative changes with spinal stenosis at L3-4, L4-5, and L5-S1. Patient suffered a fall overnight 08/29/2021 without injury. Worsening muscle spasms 09/11/2021, baclofen started. Patient transitioned to regular diet 09/12/2021. Episode of emesis overnight 4/19-4/20.  ? ? LOS: 76 days  ? ?-Continue efforts at rehabilitation. ?-Goal is for SNF placement vs. Home ?-Reached out to CM and CSW to see what options are available to Brian [name] to ensure he doesn't lose his home. ? ? ? ?Viona Gilmore, DNP, AGNP-C ?Nurse Practitioner ? ?[facility] ?[address], [address], [place], [place] [address] ?[id number]    [id number] ? ?09/13/2021, 11:24 AM ? ? ? ? ?

## 2021-09-13 NOTE — Plan of Care (Signed)
?  Problem: Education: ?Goal: Ability to verbalize activity precautions or restrictions will improve ?Outcome: Progressing ?Goal: Knowledge of the prescribed therapeutic regimen will improve ?Outcome: Progressing ?Goal: Understanding of discharge needs will improve ?Outcome: Progressing ?  ?Problem: Activity: ?Goal: Ability to avoid complications of mobility impairment will improve ?Outcome: Progressing ?Goal: Ability to tolerate increased activity will improve ?Outcome: Progressing ?Goal: Will remain free from falls ?Outcome: Progressing ?  ?Problem: Bowel/Gastric: ?Goal: Gastrointestinal status for postoperative course will improve ?Outcome: Progressing ?  ?Problem: Clinical Measurements: ?Goal: Ability to maintain clinical measurements within normal limits will improve ?Outcome: Progressing ?Goal: Postoperative complications will be avoided or minimized ?Outcome: Progressing ?Goal: Diagnostic test results will improve ?Outcome: Progressing ?  ?Problem: Pain Management: ?Goal: Pain level will decrease ?Outcome: Progressing ?  ?Problem: Skin Integrity: ?Goal: Will show signs of wound healing ?Outcome: Progressing ?  ?Problem: Health Behavior/Discharge Planning: ?Goal: Identification of resources available to assist in meeting health care needs will improve ?Outcome: Progressing ?  ?Problem: Bladder/Genitourinary: ?Goal: Urinary functional status for postoperative course will improve ?Outcome: Progressing ?  ?Problem: Safety: ?Goal: Ability to remain free from injury will improve ?Outcome: Progressing ?  ?Problem: Education: ?Goal: Knowledge of General Education information will improve ?Description: Including pain rating scale, medication(s)/side effects and non-pharmacologic comfort measures ?Outcome: Progressing ?  ?Problem: Health Behavior/Discharge Planning: ?Goal: Ability to manage health-related needs will improve ?Outcome: Progressing ?  ?Problem: Clinical Measurements: ?Goal: Ability to maintain clinical  measurements within normal limits will improve ?Outcome: Progressing ?Goal: Will remain free from infection ?Outcome: Progressing ?Goal: Diagnostic test results will improve ?Outcome: Progressing ?Goal: Respiratory complications will improve ?Outcome: Progressing ?Goal: Cardiovascular complication will be avoided ?Outcome: Progressing ?  ?Problem: Activity: ?Goal: Risk for activity intolerance will decrease ?Outcome: Progressing ?  ?Problem: Coping: ?Goal: Level of anxiety will decrease ?Outcome: Progressing ?  ?Problem: Elimination: ?Goal: Will not experience complications related to urinary retention ?Outcome: Progressing ?  ?Problem: Pain Managment: ?Goal: General experience of comfort will improve ?Outcome: Progressing ?  ?Problem: Safety: ?Goal: Ability to remain free from injury will improve ?Outcome: Progressing ?  ?Problem: Skin Integrity: ?Goal: Risk for impaired skin integrity will decrease ?Outcome: Progressing ?  ?

## 2021-09-13 NOTE — Progress Notes (Signed)
?PROGRESS NOTE ? ? ? ?Brian [name]  [id number] DOB: 29-Apr-1961 DOA: 06/29/2021 ?PCP: Pcp, No  ? 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  He was admitted on 2/3 and underwent ACDF of C3-4, C4-5, C5-5, and C6-7.  Post-operatively he was progressing slowly with residual weakness in both hands but improving lower extremity strength and function.  He developed some difficulty swallowing on 2/5 and SLP ordered and placed on thickened liquids.   Overnight 2/6, patient with increased difficulty swallowing and was made NPO but also noted to have dysarthria and difficulty moving extremities with numbness.  SLP evaluated and found to be moderate aspiration risk.   Also progressively tachycardic, tachypneic, and now febrile.  Code stroke activated and taken for CT/ CTA head and neck and was given decadron 10mg  once.  NIHSS 18.  CTH was negative for acute findings, and CTA head neck did not reveal any LVO but noted significant prevertebral soft tissue swelling with foci of gas present at the ventral epidural space at C4-5, small collection not excluded. On 2/7, patient with worsening confusion and concern for airway involvement, PCCM consulted and remained in the intensive care unit until he is transferred to the floor on 3/2 and PCCM requested transition to [facility] for medical assistance while patient remains under the neurosurgery service. ? ?Significant Hospital Events: ?2/3 ACDF C3-4, C4-5, C5-5, and C6-7 w/ Dr. Annette Stable ?2/6 SLP eval for difficulty swallowing ?2/7 Code stroke overnight, neg for LVO, CT showing soft tissue swelling.  PCCM consulted for concern of airway management  and AMS.  Went for evacuation of hematoma  ?2/18 CT Abd/Pelvis: showing bilateral segmental Pes, started on heparin and showing worsening pneumonia, febrile to  103.  PCT rising, 34.6, restarted on abx ?2/19 possible aspiration w/ severe hypoxia; intubated; Switching from heparin to angiomax given subtherapeutic levels despite increase in rate. ?2/20 Echo shows normal LV systolic function. There was notation of a + McConnell's sign c/w a large pulmonary embolus which is consistent with the finding of bilateral segmental pulmonary emboli noted on CT 07/14/2021. ?2/21 rash appreciated on back; stopped cefepim/vanc switched to zosyn; required low dose levo with increase in fentanyl ?2/22 remains intubated; unable to extubate to do increase RR and thick secretions ?2/23 Ketamine infusion added ?2/24: weaning fentanyl.  ?2/26 extubated ?2/28 tachycardia persists ?3/2: Modified barium swallow, continues n.p.o. with core track in place, likely will need PEG tube. ?3/6: s/p IR G-tube placement ?3/16: Foley catheter discontinued ?3/24: E. coli UTI/bacteremia>> cefazolin x7 days per ID ?4/5-4/6 Had a fall overnight and fell out of the bed and was unwitnessed but patient states he rolled out of the bed and did not get hurt. Has fall precautions now. ?4/68fell while trying to reach for his eyeglasses, reports landed on left knee, reports it is a soft fall, denies left knee pain, denies hit his head ?4/14 urine is cloudy, repeat urine culture + Citrobacter Freundii that is Sensitive to Bactrim,  ?4/16 reinserted foley due to recurrent urinary retention  ?medically clear awaiting SNF placement; difficult to place per TOC. ?4/19-4/20 Vomited overnight; is recently started on regular diet with thin liquids and has continuous tube feeds were transitioned to nightly only.  Vomited about 350 cc of tube feed material early this morning.  As needed Zofran given and his TF were stopped.  ?  ?Assessment and Plan: ?* Cervical myelopathy  (HCC); severe cervical spinal stenosis with cord compression s/p ACDF [id number] on 2/3, complicated by epidural hematoma s/p evacuation 2/7 ?-Patient initially presented s/p recent fall with progressive upper and lower extremity weakness and tremors, He was found to have critical multilevel cervical spinal stenosis @ C3-6 with spinal cord compression and spinal cord signal change and underwent ACDF C3-4, C4-5, C5-5, and C6/7 by neurosurgery Dr. Trenton Gammon on 06/29/2021.  Postoperative complicated by epidural hematoma s/p evacuation on 2/7. ?--Continue PT/OT/SLP efforts ?--Awaiting SNF placement per TOC ?--Further as per neurosurgery recommendation. ? ?Acute respiratory failure with hypoxia ([place]) ?-Likely Multifactorial with significant dysphagia and recurrent aspiration pneumonia events during initial hospitalization following ACDF surgery with postoperative complications of postoperative cervical hematoma.   ?-Patient did require ventilatory support in the intensive care unit and was successfully extubated on 07/22/2021.  Patient completed extensive course of empiric antibiotics.   ?-SpO2: 100 % ?O2 Flow Rate (L/min): 0 L/min ?FiO2 (%): 40 %; Oxygen now weaned off, on room air. ?-Continue Aspiration Precautions ? ?Acute pulmonary embolism ([place]) ?-CTA chest showed bilateral segmental PE.  ?-LE [place] negative for DVT.   ?-TTE with normal LV systolic function.  Supplemental oxygen weaned off. ?-C/w Eliquis 5 mg BID ? ?Acute metabolic encephalopathy ?-CT head, MRI brain, EEG, TSH, B12, ammonia unrevealing.  Etiology likely multifactorial with acute respiratory failure secondary to aspiration pneumonia, bilateral pulmonary embolism.  -Completed course of antibiotics for aspiration pneumonia.  Seroquel and clonazepam discontinued.  Completed course of antibiotics for E. coli UTI/septicemia. ?-C/w Supportive care ? ?E. coli bacteremia ?-Urine culture and blood cultures x2 08/17/2028 positive for E. coli.  ?-Evaluated by infectious  disease.   ?-Completed 7-day course of antibiotics per ID. ? ?Severe sepsis due to recurrent aspiration pneumonia ?-Completed extensive course of antibiotics while under ICU care. S/p IR gtube 3/6. ?-Continue tube feeds nocturnally but may need to hold given that he had Vomiting overnight   ?-Cleared for regular diet with thin liquids per SLP; but with very little oral intake per RN; and patient with little desire for oral intake but he ate well last night ?-Will obtain a Calorie Count and see how much he is eating ?-Continue aspiration precautions ?-Continue SLP efforts while inpatient ? ?Sinus tachycardia ?-Multifactorial including sepsis, dehydration, PE, agitation.   ?-Continue Metoprolol 12.5 mg twice daily ?-HR improved and ranging from 70-98 now  ? ?Elevated liver enzymes ?-Etiology likely secondary to sepsis from aspiration pneumonia as above.  Acute hepatitis panel negative.  RUQ ultrasound unremarkable. ?-Continue monitor LFTs intermittently as last AST was normal at 27 and ALT was 21 on last check  ? ?Hyponatremia ?-Etiology likely secondary to dehydration.   ?-Urine sodium low.  Continue tube feeds; now cleared by speech therapy for dysphagia 3 diet. ?-Na+ went from 135 -> 133 on last check  ?-Continue to monitor BMP intermittently ? ?Tobacco use disorder ?-Counseled on need for complete cessation.  -Treated with nicotine patch which has now been weaned off. ? ?Physical deconditioning ?-Significant weakness in all extremities as a result of cervical myelopathy and acute illness.  Slowly improving while inpatient. ?-Continue PT/OT efforts ?-Pending SNF placement; difficult placement per TOC ? ?Urinary retention ?-Foley catheter discontinued on 08/09/2021; replaced on 09/09/2021 for recurrent urinary retention.   ?-Etiology likely secondary to poor mobility status. ?-C/w Tamsulosin 0.8 mg  p.o. daily ?-C/w Finasteride 5 mg p.o. daily ?-Continue to monitor urinary output; Strict I's and O's and Patient is  +1.576 Liters  ? ?Protein-calorie malnutrition, severe ([place]) ?-As evidenced by poor p.o. intake, significant muscle mass and subcu fat loss and significant weight loss (about 16 pounds since this hospitalization).

## 2021-09-13 NOTE — TOC Progression Note (Signed)
Transition of Care (TOC) - Progression Note  ? ? ?Patient Details  ?Name: Brian [name] ?MRN: [id number] ?Date of Birth: 1961-02-16 ? ?Transition of Care (TOC) CM/SW Contact  ?Geralynn Ochs, LCSW ?Phone Number: ?09/13/2021, 3:22 PM ? ?Clinical Narrative:   CSW met with patient to discuss concerns about disability and when he could return home. Patient was asking about when his disability might get approved and when he might be discharged, and CSW was unable to answer that question. CSW reminded patient that disability was still in progress but it's out of our hands now, and it can take months for it to process. Patient appreciative of CSW update and coming to check in on him. CSW to continue to follow. ? ? ? ?  ?Barriers to Discharge: SNF Pending bed offer, SNF Pending payor source - LOG, SNF Pending Medicaid, Inadequate or no insurance ? ?Expected Discharge Plan and Services ?  ?In-house Referral: Clinical Social Work ?  ?  ?  ?                ?  ?  ?  ?  ?  ?  ?  ?  ?  ?  ? ? ?Social Determinants of Health (SDOH) Interventions ?  ? ?Readmission Risk Interventions ?   ? View : No data to display.  ?  ?  ?  ? ? ?

## 2021-09-13 NOTE — Progress Notes (Signed)
Occupational Therapy Treatment ?Patient Details ?Name: Brian [name] ?MRN: [id number] ?DOB: May 06, 1961 ?Today's Date: 09/13/2021 ? ? ?History of present illness Pt is a 61 y/o male admitted 06/29/21 following C3-7 ACDF after progressive cervical myelopathy symptoms including ataxia, bil UE and LE shaking and weakness, and loss of function in his hands with subsequent fall. Pt initially had improvement in neurological symptoms, but postoperative course was complicated by dysphasia and ataxia secondary to and epidural hematoma with evacuation of hematoma on 2/7. Course complicated by delirium secondary to encephalopathy, sepsis secondary to aspiration PNA, reintubated 2/19-2/26 for airway protection, bilat segmental PEs 2/18. PEG placed 3/6. PMHx includes tobacco and EtOH use disorder. ?  ?OT comments ? Patient received in supine with nursing providing pain meds. Patient agreed to OT session but stated he did not feel well. Patient was min assist to get to EOB and performed LB dressing with donning socks seated on EOB with sock aide and mod/max assist due to difficulty using BUEs. Patient was assisted to recliner and attempted self feeding with U-cuff on LUE but patient had difficulty loading utensil and bring to mouth required increased assistance. Patient began feeling nauseous and with increased lethargy and asked to return to supine. Nursing notified of complaints of nausea. Acute OT to continue to follow.   ? ?Recommendations for follow up therapy are one component of a multi-disciplinary discharge planning process, led by the attending physician.  Recommendations may be updated based on patient status, additional functional criteria and insurance authorization. ?   ?Follow Up Recommendations ? Skilled nursing-short term rehab (<3 hours/day)  ?  ?Assistance Recommended at Discharge Frequent or constant Supervision/Assistance  ?Patient can return home with the following ? Two people to help with walking and/or  transfers;Direct supervision/assist for medications management;Assist for transportation;Help with stairs or ramp for entrance;Assistance with feeding;Two people to help with bathing/dressing/bathroom;Assistance with cooking/housework;Direct supervision/assist for financial management ?  ?Equipment Recommendations ? Wheelchair (measurements OT);Wheelchair cushion (measurements OT);Hospital bed;BSC/3in1;Tub/shower seat  ?  ?Recommendations for Other Services   ? ?  ?Precautions / Restrictions Precautions ?Precautions: Fall ?Precaution Booklet Issued: No ?Restrictions ?Weight Bearing Restrictions: No  ? ? ?  ? ?Mobility Bed Mobility ?Overal bed mobility: Needs Assistance ?Bed Mobility: Supine to Sit, Sit to Supine ?  ?  ?Supine to sit: Min assist ?Sit to supine: Min assist ?  ?General bed mobility comments: increased time and using rail to assist with assistance for trunk to get to EOB and assistance with BLEs to get to supine ?  ? ?Transfers ?Overall transfer level: Needs assistance ?Equipment used: None ?Transfers: Sit to/from Stand, Bed to chair/wheelchair/BSC ?Sit to Stand: Min assist ?  ?  ?Step pivot transfers: Max assist ?  ?  ?General transfer comment: step pivot transfer to recliner and back to EOB with face to face technique for support ?  ?  ?Balance Overall balance assessment: Independent ?Sitting-balance support: No upper extremity supported, Feet supported ?Sitting balance-Leahy Scale: Fair ?Sitting balance - Comments: able to perform LB dressing with donning socks with sock aide sitting on EOB ?Postural control: Posterior lean ?Standing balance support: Bilateral upper extremity supported, During functional activity ?Standing balance-Leahy Scale: Poor ?Standing balance comment: requires external support ?  ?  ?  ?  ?  ?  ?  ?  ?  ?  ?  ?   ? ?ADL either performed or assessed with clinical judgement  ? ?ADL Overall ADL's : Needs assistance/impaired ?Eating/Feeding: Moderate assistance;With assist to  don/doff brace/orthosis;Sitting ?  Eating/Feeding Details (indicate cue type and reason): patient required increased assistance on this date due to UE spasms ?Grooming: Wash/dry hands;Wash/dry face;Supervision/safety;Sitting ?Grooming Details (indicate cue type and reason): able to wash hands and face prior to breakfast ?  ?  ?  ?  ?  ?  ?Lower Body Dressing: Moderate assistance;Maximal assistance;With adaptive equipment;Sit to/from stand ?Lower Body Dressing Details (indicate cue type and reason): patient required assistance to donn sock on sock aide and to pull up while sitting on EOB ?  ?  ?  ?  ?  ?  ?  ?General ADL Comments: increased difficulty on this date due to complaints of spasms and feeling lethargic following pain meds ?  ? ?Extremity/Trunk Assessment Upper Extremity Assessment ?RUE Deficits / Details: having spasms ?LUE Deficits / Details: complaints of spasms ?  ?  ?  ?  ?  ? ?Vision   ?  ?  ?Perception   ?  ?Praxis   ?  ? ?Cognition Arousal/Alertness: Awake/alert ?Behavior During Therapy: [facility] for tasks assessed/performed ?Overall Cognitive Status: No family/caregiver present to determine baseline cognitive functioning ?Area of Impairment: Safety/judgement ?  ?  ?  ?  ?  ?  ?  ?  ?  ?  ?  ?Following Commands: Follows one step commands consistently, Follows multi-step commands consistently ?  ?  ?  ?General Comments: Patient began having increased lethagy during visit due to pain meds ?  ?  ?   ?Exercises   ? ?  ?Shoulder Instructions   ? ? ?  ?General Comments    ? ? ?Pertinent Vitals/ Pain       Pain Assessment ?Pain Assessment: Faces ?Faces Pain Scale: Hurts even more ?Pain Location: spasms to BUE/BLEs ?Pain Descriptors / Indicators: Grimacing, Spasm ?Pain Intervention(s): Limited activity within patient's tolerance, Monitored during session, RN gave pain meds during session ? ?Home Living   ?  ?  ?  ?  ?  ?  ?  ?  ?  ?  ?  ?  ?  ?  ?  ?  ?  ?  ? ?  ?Prior Functioning/Environment    ?  ?  ?  ?    ? ?Frequency ? Min 2X/week  ? ? ? ? ?  ?Progress Toward Goals ? ?OT Goals(current goals can now be found in the care plan section) ? Progress towards OT goals: Progressing toward goals ? ?Acute Rehab OT Goals ?Patient Stated Goal: get better ?OT Goal Formulation: With patient ?Time For Goal Achievement: 09/19/21 ?Potential to Achieve Goals: Fair ?ADL Goals ?Pt Will Perform Eating: with min assist;with assist to don/doff brace/orthosis;with adaptive utensils;sitting ?Pt Will Perform Grooming: sitting;with min assist ?Pt Will Perform Upper Body Bathing: with mod assist;sitting ?Pt Will Transfer to Toilet: with min assist;stand pivot transfer;bedside commode ?Pt/caregiver will Perform Home Exercise Program: Increased ROM;Increased strength;Both right and left upper extremity;With theraputty;With minimal assist;With written HEP provided ?Additional ADL Goal #1: Pt will grasp objects with R hand and bring to mouth 10/10 trials in prep for self feeding ?Additional ADL Goal #2: Pt will grasp/release 5/5 objects with L hand to increase indep with ADLs  ?Plan Discharge plan remains appropriate   ? ?Co-evaluation ? ? ?   ?  ?  ?  ?  ? ?  ?AM-PAC OT "6 Clicks" Daily Activity     ?Outcome Measure ? ? Help from another person eating meals?: A Lot ?Help from another person taking care of personal grooming?: A  Lot ?Help from another person toileting, which includes using toliet, bedpan, or urinal?: A Lot ?Help from another person bathing (including washing, rinsing, drying)?: A Lot ?Help from another person to put on and taking off regular upper body clothing?: A Lot ?Help from another person to put on and taking off regular lower body clothing?: A Lot ?6 Click Score: 12 ? ?  ?End of Session Equipment Utilized During Treatment: Gait belt ? ?OT Visit Diagnosis: Other abnormalities of gait and mobility (R26.89);Muscle weakness (generalized) (M62.81);Feeding difficulties (R63.3) ?Pain - Right/Left: Left ?Pain - part of body:  Shoulder ?  ?Activity Tolerance Patient limited by lethargy ?  ?Patient Left in bed;with call bell/phone within reach;with bed alarm set ?  ?Nurse Communication Mobility status;Other (comment) (patient feeling nauseous) ?

## 2021-09-14 DIAGNOSIS — G959 Disease of spinal cord, unspecified: Secondary | ICD-10-CM | POA: Diagnosis not present

## 2021-09-14 DIAGNOSIS — I2601 Septic pulmonary embolism with acute cor pulmonale: Secondary | ICD-10-CM | POA: Diagnosis not present

## 2021-09-14 DIAGNOSIS — G9341 Metabolic encephalopathy: Secondary | ICD-10-CM | POA: Diagnosis not present

## 2021-09-14 DIAGNOSIS — J9601 Acute respiratory failure with hypoxia: Secondary | ICD-10-CM | POA: Diagnosis not present

## 2021-09-14 LAB — GLUCOSE, CAPILLARY
Glucose-Capillary: 125 mg/dL — ABNORMAL HIGH (ref 70–99)
Glucose-Capillary: 101 mg/dL — ABNORMAL HIGH (ref 70–99)
Glucose-Capillary: 103 mg/dL — ABNORMAL HIGH (ref 70–99)
Glucose-Capillary: 113 mg/dL — ABNORMAL HIGH (ref 70–99)
Glucose-Capillary: 117 mg/dL — ABNORMAL HIGH (ref 70–99)

## 2021-09-14 MED ORDER — KCL IN DEXTROSE-NACL 20-5-0.9 MEQ/L-%-% IV SOLN
INTRAVENOUS | Status: AC
  Filled 2021-09-14: qty 1000

## 2021-09-14 NOTE — Progress Notes (Signed)
Nutrition Follow-up ? ?DOCUMENTATION CODES:  ?Severe malnutrition in context of social or environmental circumstances, Underweight ? ?INTERVENTION:  ?Continue to meet 75-100% of pt's nutrition needs via nocturnal TF via PEG: ? -Osmolite 1.5 @ 47ml/hr x 16 hours (administer from 1700-0900) ? -84ml Prosource TF BID ? ?This provides 2120 kcals, 107 grams of protein and 1036 mL of free water (2082ml total free water with flushes) ?Meets 100% minimum estimated calorie and protein needs ? ?May need to add additional free water flushes if/when IVF are discontinued, otherwise, provide maintenance flushes as per tube feeding protocol (44ml Q4H) ? ?NUTRITION DIAGNOSIS:  ?Severe Malnutrition related to social / environmental circumstances as evidenced by severe muscle depletion, severe fat depletion. -- ongoing  ? ?GOAL:  ?[name] will meet greater than or equal to 90% of their needs -- addressing via TF ? ?MONITOR:  ?TF tolerance ? ?REASON FOR ASSESSMENT:  ?Consult ?Assessment of nutrition requirement/status, Enteral/tube feeding initiation and management ? ?ASSESSMENT:  ?Pt with hx EtOH abuse, tobacco use, and spinal stenosis initially presented 2/3 for planned multilevel anterior cervical decompression and fusion surgery after experiencing progressive bilateral upper and lower extremity weakness and spasticity due to critical multilevel cervical spinal stenosis with spinal cord compression and signal change. ? ?02/03 - s/p anterior cervical discectomy with interbody fusion  ?02/05 - pt initially complained of worsening swallowing function ?02/06 - MBS, NPO per SLP ?02/07 - Code Stroke activated (negative) Brian transferred to ICU, s/p re-exploration anterior cervical fusion with evacuation of epidural hematoma ?02/08 - Cortrak tube placed (tip gastric), tube feeds initiated ?02/10 - MBS, diet advanced to dysphagia 2 with nectar-thick liquids, Cortrak removed ?02/11 - NPO ?02/13 - diet advanced to dysphagia 2 with nectar-thick  liquids ?02/16 - diet advanced to dysphagia 2 with thin liquids ?02/19- Intubated after desaturation, possibly from PO intake ?02/20 - s/p cortrak tube; post pyloric  ?02/22 - TF off, golytely given ?02/23 - restarted and advancing TF ?02/26 - Extubated ?03/02 - failed MBS ?03/06 - PEG placed ?03/23 - diet advanced to dysphagia 2 with nectar thick liquids s/p MBSS ?03/24 - PEG replaced ?03/25 - diet advanced to dysphagia 2 with nectar thick liquids ?03/27 - diet downgraded to dysphagia 1 with nectar thick liquids ?03/28 - diet advanced to dysphagia 3 with thin liquids ?03/30 - diet downgraded to dysphagia 3 with nectar thick liquids ?04/13 - diet advanced to regular diet with nectar thick liquids ?04/14 - diet advanced to regular diet with thin liquids ? ?Pt continues to have minimal PO intake and little desire to take POs; receiving nocturnal TF via PEG to provide majority of nutrition needs. Pt had been tolerating TF without issue until Brian evening of 4/19 when pt vomited ~350cc of what was described to be tube feed -- RD unable to verify if this is accurate.  No signs of intolerance noted since. Pt's LBM documented as 4/18, though RN notes pt with active bowel sounds and is passing flatus. Abdominal xray w/o significant findings and PEG well positioned. ? ?Current TF: Osmolite 1.5 @ 65ml/hr x 16 hours  w/ 81ml Prosource TF BID and 177ml free water Q4H. Pt started on IVF fluids yesterday so will discontinue free water flushes (will keep maintenance flushes as per tube feeding protocol) ? ?UOP: 2041ml x24 hours ?I/O: -7351ml since admit ? ?Pt's weight slowly trending up -- continue current nutrition plan of care to continue weight gain. ? ?Admit wt 61.7 kg ?Current wt 55.3 kg ? ?Medications: folvite, mvi with  minerals, thiamine ?IVF: D5-NS @ 35ml/hr  ?Labs: ?Recent Labs  ?Lab 09/08/21 ?0159 09/10/21 ?[id number] 09/12/21 ?[id number] 09/13/21 ?1605  ?NA 136 135 133* 138  ?K 4.2 4.3 4.5 4.2  ?CL 104 102 99 101  ?CO2 27 27 28 29    ?BUN 30* 26* 22* 21*  ?CREATININE 0.77 0.72 0.99 1.11  ?CALCIUM 9.9 9.8 10.0 10.7*  ?MG 1.8  --  1.9 1.9  ?PHOS  --   --   --  4.2  ?GLUCOSE 119* 101* 93 105*  ?CBGs: 98-125 x 24 hours ? ?Diet Order:   ?Diet Order   ? ?       ?  Diet regular Room service appropriate? Yes; Fluid consistency: Thin  Diet effective now       ?  ? ?  ?  ? ?  ? ?EDUCATION NEEDS:  ?Education needs have been addressed ? ?Skin:  Skin Assessment: Skin Integrity Issues: ?Skin Integrity Issues:: Other (Comment) ?Incisions: closed neck ?Other: MASD buttocks ? ?Last BM:  4/18 ? ?Height:  ?Ht Readings from Last 1 Encounters:  ?07/15/21 5\' 10"  (1.778 m)  ? ?Weight:  ?Wt Readings from Last 1 Encounters:  ?09/14/21 55.3 kg  ? ?Ideal Body Weight:  78.2 kg ? ?BMI:  Body mass index is 17.49 kg/m?. ? ?Estimated Nutritional Needs:  ?Kcal:  2100-2300 ?Protein:  105-125 grams ?Fluid:  >2L/day ? ? ? ?Theone Stanley., MS, RD, LDN (she/her/hers) ?RD pager number and weekend/on-call pager number located in [place]. ? ? ?

## 2021-09-14 NOTE — Progress Notes (Signed)
? ?  Providing Compassionate, Quality Care - Together ? ? ?Subjective: ?Patient reports no nausea or vomiting since yesterday morning. He would like to go home. ? ?Objective: ?Vital signs in last 24 hours: ?Temp:  [97.7 ?F (36.5 ?C)-98.9 ?F (37.2 ?C)] 98 ?F (36.7 ?C) (04/21 0741) ?Pulse Rate:  [77-99] 97 (04/21 0741) ?Resp:  [15-16] 16 (04/21 0741) ?BP: (103-121)/(70-84) 120/76 (04/21 0741) ?SpO2:  [98 %-100 %] 100 % (04/21 0741) ?Weight:  [55.3 kg] 55.3 kg (04/21 0500) ? ?Intake/Output from previous day: ?04/20 0701 - 04/21 0700 ?In: 1110.2 [P.O.:300; I.V.:810.2] ?Out: 2050 [Urine:2000; Emesis/NG output:50] ?Intake/Output this shift: ?Total I/O ?In: -  ?Out: 700 [Urine:700] ? ?  ?Alert; oriented to person, place, and time ?MAE, Generalized weakness ?Decreased fine motor BUE ?Incision is well-healed ? ?Lab Results: ?Recent Labs  ?  09/12/21 ?0305 09/13/21 ?[id number]  ?WBC 6.9 6.2  ?HGB 8.4* 9.6*  ?HCT 26.8* 29.1*  ?PLT 358 386  ? ?BMET ?Recent Labs  ?  09/12/21 ?0305 09/13/21 ?[id number]  ?NA 133* 138  ?K 4.5 4.2  ?CL 99 101  ?CO2 28 29  ?GLUCOSE 93 105*  ?BUN 22* 21*  ?CREATININE 0.99 1.11  ?CALCIUM 10.0 10.7*  ? ? ?Studies/Results: ?DG Abd Portable 1V ? ?Result Date: 09/13/2021 ?CLINICAL DATA:  Abdominal distension. EXAM: PORTABLE ABDOMEN - 1 VIEW COMPARISON:  08/17/2021 FINDINGS: Moderate amount of oral contrast material is seen throughout the colon. No evidence of dilated bowel loops. No evidence of extraluminal gas collections. No radio-opaque calculi or other significant radiographic abnormality are seen. IMPRESSION: Nonobstructive bowel gas pattern. Moderate amount of oral contrast material throughout the colon. Electronically Signed   By: Marlaine Hind M.D.   On: 09/13/2021 12:47   ? ?Assessment/Plan: ?Patient status post C3-4, C4-5, C5-6, C6-7 anterior cervical discectomy with interbody fusion by Dr. Annette Stable on 06/29/2021. Increased difficulty swallowing with lung atelectasis and elevated temperatures on 07/02/2021. Made NPO  following MBS by SLP. 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 CTA head and neck negative for stroke. MRI revealed epidural hematoma. Patient underwent exploration of his cervical fusion with evacuation of the epidural hematoma on 07/03/2021. Cortrak was placed on 07/04/2021. Patient's strength much improved since epidural hematoma evacuation. Cortrak removed 07/06/2021 and dysphagia 2 diet with nectar thick liquids started. Patient with delirium vs ETOH withdrawal. Discontinued steroids 07/06/2021. CIWA protocol started and discontinued 07/07/2021. CT and MRI 07/07/2021 negative. Patient developed respiratory distress, requiring intubation on 07/15/2021. Work up revealed aspiration PNA and PE. Patient was extubated on 07/22/2021. Foley catheter removed 07/24/2021. Patient transferred to 3W on 07/26/2021. Foley replaced 07/28/2021. PEG placed 07/30/2021. Foley removed 08/09/2021. PEG pulled out by patient 08/16/2021. Delirium worsening 08/17/2021. Haldol administered. IR replaced PEG on 08/17/2021. Patient with urosepsis 08/18/2021. Culture grew E. Coli. C/o LBP 08/28/2021. Lumbar MRI demonstrated chronic degenerative changes with spinal stenosis at L3-4, L4-5, and L5-S1. Patient suffered a fall overnight 08/29/2021 without injury. Worsening muscle spasms 09/11/2021, baclofen started. Patient transitioned to regular diet 09/12/2021. Episode of emesis overnight 4/19-4/20.  ?  ? LOS: 77 days  ? ?-Continue efforts at rehabilitation. ?-Patient would like to discharge home. ? ? ?Viona Gilmore, DNP, AGNP-C ?Nurse Practitioner ? ?[facility] ?[address], [address], [place], [place] [address] ?[id number]    [id number] ? ?09/14/2021, 10:12 AM ? ? ? ? ?

## 2021-09-14 NOTE — Progress Notes (Signed)
Physical Therapy Treatment ?Patient Details ?Name: Brian [name] ?MRN: [id number] ?DOB: 1960/07/14 ?Today's Date: 09/14/2021 ? ? ?History of Present Illness Pt is a 61 y/o male admitted 06/29/21 following C3-7 ACDF after progressive cervical myelopathy symptoms including ataxia, bil UE and LE shaking and weakness, and loss of function in his hands with subsequent fall. Pt initially had improvement in neurological symptoms, but postoperative course was complicated by dysphasia and ataxia secondary to and epidural hematoma with evacuation of hematoma on 2/7. Course complicated by delirium secondary to encephalopathy, sepsis secondary to aspiration PNA, reintubated 2/19-2/26 for airway protection, bilat segmental PEs 2/18. PEG placed 3/6. PMHx includes tobacco and EtOH use disorder. ? ?  ?PT Comments  ? ? Pt tolerates treatment well, demonstrating improvements in lateral transfers and experiencing no major losses of balance during ambulation. PT provides continued education on improved safety with lateral scoot transfers vs squat pivot transfers. Pt will benefit from continued transfer training to aide in short term goal of independence at a wheelchair level of mobility, with intermittent gait training to achieve a long term goal of a return to ambulation.   ?Recommendations for follow up therapy are one component of a multi-disciplinary discharge planning process, led by the attending physician.  Recommendations may be updated based on patient status, additional functional criteria and insurance authorization. ? ?Follow Up Recommendations ? Acute inpatient rehab (3hours/day) ?  ?  ?Assistance Recommended at Discharge Frequent or constant Supervision/Assistance  ?Patient can return home with the following Two people to help with walking and/or transfers;A lot of help with bathing/dressing/bathroom;Assistance with cooking/housework;Assistance with feeding;Direct supervision/assist for medications management;Direct  supervision/assist for financial management;Assist for transportation;Help with stairs or ramp for entrance ?  ?Equipment Recommendations ? Wheelchair (measurements PT);Wheelchair cushion (measurements PT);Hospital bed;BSC/3in1;Other (comment) (drop arm bedside commode)  ?  ?Recommendations for Other Services   ? ? ?  ?Precautions / Restrictions Precautions ?Precautions: Fall ?Precaution Booklet Issued: No ?Precaution Comments: Verbally reviewed precautions ?Required Braces or Orthoses:  (C-collar discharged) ?Restrictions ?Weight Bearing Restrictions: No  ?  ? ?Mobility ? Bed Mobility ?  ?  ?  ?  ?  ?  ?  ?  ?  ? ?Transfers ?Overall transfer level: Needs assistance ?Equipment used: Rolling walker (2 wheels), None ?Transfers: Bed to chair/wheelchair/BSC, Sit to/from Stand ?Sit to Stand: Min assist, Min guard ?  ?  ?Squat pivot transfers: Min assist ?  ? Lateral/Scoot Transfers: Min guard ?General transfer comment: pt performs 2 squat pivot transfers, one with rapid descent and posterior lean and the other with R knee buckle. Pt then performs lateral scoot with improved control and no loss of balance. Pt later stands 5 times with use of walker, minG-minA ?  ? ?Ambulation/Gait ?Ambulation/Gait assistance: Min assist, +2 safety/equipment (chair follow by nurse tech) ?Gait Distance (Feet): 100 Feet (100' x 2, additional 20' x 2) ?Assistive device: Rolling walker (2 wheels) ?Gait Pattern/deviations: Step-through pattern, Ataxic, Scissoring ?Gait velocity: reduced ?Gait velocity interpretation: <1.8 ft/sec, indicate of risk for recurrent falls ?  ?General Gait Details: pt with slowed step-through gait, intermittent scissoring with feet hitting each other during swing phase. PT provides cues to widen stance and utilize visual cues of hallway tiles. ? ? ?Stairs ?  ?  ?  ?  ?  ? ? ?Wheelchair Mobility ?  ? ?Modified Rankin (Stroke Patients Only) ?  ? ? ?  ?Balance Overall balance assessment: Needs assistance ?Sitting-balance  support: No upper extremity supported, Feet supported ?Sitting balance-Leahy Scale: Fair ?  ?  ?  Standing balance support: Bilateral upper extremity supported, Reliant on assistive device for balance ?Standing balance-Leahy Scale: Poor ?Standing balance comment: minG and BUE support of walker ?  ?  ?  ?  ?  ?  ?  ?  ?  ?  ?  ?  ? ?  ?Cognition Arousal/Alertness: Awake/alert ?Behavior During Therapy: [facility] for tasks assessed/performed ?Overall Cognitive Status: Impaired/Different from baseline ?Area of Impairment: Safety/judgement ?  ?  ?  ?  ?  ?  ?  ?  ?  ?  ?  ?  ?Safety/Judgement: Decreased awareness of safety ?  ?  ?  ?  ?  ? ?  ?Exercises   ? ?  ?General Comments General comments (skin integrity, edema, etc.): VSS on RA ?  ?  ? ?Pertinent Vitals/Pain Pain Assessment ?Pain Assessment: Faces ?Faces Pain Scale: Hurts little more ?Pain Location: L foot ?Pain Descriptors / Indicators: Grimacing ?Pain Intervention(s): Monitored during session  ? ? ?Home Living   ?  ?  ?  ?  ?  ?  ?  ?  ?  ?   ?  ?Prior Function    ?  ?  ?   ? ?PT Goals (current goals can now be found in the care plan section) Acute Rehab PT Goals ?Patient Stated Goal: home ?Progress towards PT goals: Progressing toward goals ? ?  ?Frequency ? ? ? Min 5X/week ? ? ? ?  ?PT Plan Current plan remains appropriate  ? ? ?Co-evaluation   ?  ?  ?  ?  ? ?  ?AM-PAC PT "6 Clicks" Mobility   ?Outcome Measure ? Help needed turning from your back to your side while in a flat bed without using bedrails?: A Little ?Help needed moving from lying on your back to sitting on the side of a flat bed without using bedrails?: A Little ?Help needed moving to and from a bed to a chair (including a wheelchair)?: A Little ?Help needed standing up from a chair using your arms (e.g., wheelchair or bedside chair)?: A Little ?Help needed to walk in hospital room?: A Little ?Help needed climbing 3-5 steps with a railing? : Total ?6 Click Score: 16 ? ?  ?End of Session Equipment Utilized  During Treatment: Gait belt ?Activity Tolerance: Patient tolerated treatment well ?Patient left: in chair;with call bell/phone within reach;with chair alarm set ?Nurse Communication: Mobility status ?PT Visit Diagnosis: Muscle weakness (generalized) (M62.81);Other symptoms and signs involving the nervous system (R29.898);Difficulty in walking, not elsewhere classified (R26.2);Other abnormalities of gait and mobility (R26.89) ?  ? ? ?Time: [id number] ?PT Time Calculation (min) (ACUTE ONLY): 41 min ? ?Charges:  $Gait Training: 23-37 mins ?$Therapeutic Activity: 8-22 mins          ?          ? ?Zenaida Niece, PT, DPT ?Acute Rehabilitation ?Pager: [phone] ?Office [phone] ? ? ? ?Zenaida Niece ?09/14/2021, 3:27 PM ? ?

## 2021-09-14 NOTE — Progress Notes (Signed)
?PROGRESS NOTE ? ? ? ?REA PARCHEM  [id number] DOB: 1960-11-20 DOA: 06/29/2021 ?PCP: Pcp, No  ? 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  He was admitted on 2/3 and underwent ACDF of C3-4, C4-5, C5-5, and C6-7.  Post-operatively he was progressing slowly with residual weakness in both hands but improving lower extremity strength and function.  He developed some difficulty swallowing on 2/5 and SLP ordered and placed on thickened liquids.   Overnight 2/6, patient with increased difficulty swallowing and was made NPO but also noted to have dysarthria and difficulty moving extremities with numbness.  SLP evaluated and found to be moderate aspiration risk.   Also progressively tachycardic, tachypneic, and now febrile.  Code stroke activated and taken for CT/ CTA head and neck and was given decadron 10mg  once.  NIHSS 18.  CTH was negative for acute findings, and CTA head neck did not reveal any LVO but noted significant prevertebral soft tissue swelling with foci of gas present at the ventral epidural space at C4-5, small collection not excluded. On 2/7, patient with worsening confusion and concern for airway involvement, PCCM consulted and remained in the intensive care unit until he is transferred to the floor on 3/2 and PCCM requested transition to [facility] for medical assistance while patient remains under the neurosurgery service. ? ?Significant Hospital Events: ?2/3 ACDF C3-4, C4-5, C5-5, and C6-7 w/ Dr. Annette Stable ?2/6 SLP eval for difficulty swallowing ?2/7 Code stroke overnight, neg for LVO, CT showing soft tissue swelling.  PCCM consulted for concern of airway management  and AMS.  Went for evacuation of hematoma  ?2/18 CT Abd/Pelvis: showing bilateral segmental Pes, started on heparin and showing worsening pneumonia, febrile to  103.  PCT rising, 34.6, restarted on abx ?2/19 possible aspiration w/ severe hypoxia; intubated; Switching from heparin to angiomax given subtherapeutic levels despite increase in rate. ?2/20 Echo shows normal LV systolic function. There was notation of a + McConnell's sign c/w a large pulmonary embolus which is consistent with the finding of bilateral segmental pulmonary emboli noted on CT 07/14/2021. ?2/21 rash appreciated on back; stopped cefepim/vanc switched to zosyn; required low dose levo with increase in fentanyl ?2/22 remains intubated; unable to extubate to do increase RR and thick secretions ?2/23 Ketamine infusion added ?2/24: weaning fentanyl.  ?2/26 extubated ?2/28 tachycardia persists ?3/2: Modified barium swallow, continues n.p.o. with core track in place, likely will need PEG tube. ?3/6: s/p IR G-tube placement ?3/16: Foley catheter discontinued ?3/24: E. coli UTI/bacteremia>> cefazolin x7 days per ID ?4/5-4/6 Had a fall overnight and fell out of the bed and was unwitnessed but patient states he rolled out of the bed and did not get hurt. Has fall precautions now. ?4/71fell while trying to reach for his eyeglasses, reports landed on left knee, reports it is a soft fall, denies left knee pain, denies hit his head ?4/14 urine is cloudy, repeat urine culture + Citrobacter Freundii that is Sensitive to Bactrim,  ?4/16 reinserted foley due to recurrent urinary retention  ?medically clear awaiting SNF placement; difficult to place per TOC. ?4/19-4/20 Vomited overnight; is recently started on regular diet with thin liquids and has continuous tube feeds were transitioned to nightly only.  Vomited about 350 cc of tube feed material early this morning.  As needed Zofran given and his TF were stopped.  ?4/21 No Nausea overnight and KUB yesterday and  showed "Nonobstructive bowel gas pattern. Moderate amount of oral contrast material throughout the colon." He was started back on IVF and will now stop after 12 hours given improvement in Nausea and Vomiting  ?  ?Assessment and Plan: ?* Cervical myelopathy (HCC); severe cervical spinal stenosis with cord compression s/p ACDF [id number] on 2/3, complicated by epidural hematoma s/p evacuation 2/7 ?-Patient initially presented s/p recent fall with progressive upper and lower extremity weakness and tremors, He was found to have critical multilevel cervical spinal stenosis @ C3-6 with spinal cord compression and spinal cord signal change and underwent ACDF C3-4, C4-5, C5-5, and C6/7 by neurosurgery Dr. Trenton Gammon on 06/29/2021.  Postoperative complicated by epidural hematoma s/p evacuation on 2/7. ?-Continue PT/OT/SLP efforts ?-Awaiting SNF placement per TOC ?-Further as per neurosurgery recommendation. ? ?Acute respiratory failure with hypoxia ([place]) ?-Likely Multifactorial with significant dysphagia and recurrent aspiration pneumonia events during initial hospitalization following ACDF surgery with postoperative complications of postoperative cervical hematoma.   ?-Patient did require ventilatory support in the intensive care unit and was successfully extubated on 07/22/2021.  Patient completed extensive course of empiric antibiotics.   ?-SpO2: 100 % ?O2 Flow Rate (L/min): 0 L/min ?FiO2 (%): 40 %; Oxygen now weaned off, on room air. ?-Continue Aspiration Precautions ? ?Acute pulmonary embolism ([place]) ?-CTA chest showed bilateral segmental PE.  ?-LE [place] negative for DVT.   ?-TTE with normal LV systolic function.  Supplemental oxygen weaned off. ?-C/w Eliquis 5 mg BID ? ?Acute metabolic encephalopathy ?-CT head, MRI brain, EEG, TSH, B12, ammonia unrevealing.  Etiology likely multifactorial with acute respiratory failure secondary to aspiration pneumonia, bilateral pulmonary embolism.  -Completed course of antibiotics for aspiration  pneumonia.  Seroquel and clonazepam discontinued.  Completed course of antibiotics for E. coli UTI/septicemia. ?-C/w Supportive care ? ?E. coli bacteremia ?-Urine culture and blood cultures x2 08/17/2028 positive for E. coli.  ?-Evaluated by Infectious Disease.   ?-Completed 7-day course of antibiotics per ID. ? ?Severe sepsis due to recurrent aspiration pneumonia ?-Completed extensive course of antibiotics while under ICU care. S/p IR gtube 3/6. ?-Continue tube feeds nocturnally but may need to hold given that he had Vomiting overnight   ?-Cleared for regular diet with thin liquids per SLP; but with very little oral intake per RN; and patient with little desire for oral intake but he ate well last night ?-Will obtain a Calorie Count and see how much he is eating ?-Continue aspiration precautions ?-Continue SLP efforts while inpatient ? ?Sinus tachycardia ?-Multifactorial including sepsis, dehydration, PE, agitation.   ?-Continue Metoprolol 12.5 mg twice daily ?-HR improved and ranging from 70-98 now  ? ?Elevated liver enzymes ?-Etiology likely secondary to sepsis from aspiration pneumonia as above.  Acute hepatitis panel negative.  RUQ ultrasound unremarkable. ?-Continue monitor LFTs intermittently as last AST was normal at 26 and ALT was 20 on last check  ? ?Hyponatremia ?-Etiology likely secondary to dehydration.   ?-Urine sodium low.  Continue tube feeds; now cleared by speech therapy for dysphagia 3 diet. ?-Na+ went from 135 -> 133 -> 138 on last check  ?-Continue to monitor BMP intermittently ? ?Tobacco use disorder ?-Counseled on need for complete cessation.  -Treated with nicotine patch which has now been weaned off. ? ?Physical deconditioning ?-Significant weakness in all extremities as a result of cervical myelopathy and acute  illness.  Slowly improving while inpatient. ?-Continue PT/OT efforts ?-Pending SNF placement; difficult placement per TOC ? ?Urinary retention ?-Foley catheter discontinued on  08/09/2021; replaced on 09/09/2021 for recurrent urinary retention.   ?-Etiology likely secondary to poor mobility status. ?-C/w Tamsulosin 0.8 mg p.o. daily ?-C/w Finasteride 5 mg p.o. daily ?-Continue to monitor ur

## 2021-09-15 DIAGNOSIS — I2601 Septic pulmonary embolism with acute cor pulmonale: Secondary | ICD-10-CM | POA: Diagnosis not present

## 2021-09-15 DIAGNOSIS — D649 Anemia, unspecified: Secondary | ICD-10-CM

## 2021-09-15 DIAGNOSIS — J9601 Acute respiratory failure with hypoxia: Secondary | ICD-10-CM | POA: Diagnosis not present

## 2021-09-15 DIAGNOSIS — A498 Other bacterial infections of unspecified site: Secondary | ICD-10-CM

## 2021-09-15 DIAGNOSIS — D75839 Thrombocytosis, unspecified: Secondary | ICD-10-CM

## 2021-09-15 DIAGNOSIS — G959 Disease of spinal cord, unspecified: Secondary | ICD-10-CM | POA: Diagnosis not present

## 2021-09-15 DIAGNOSIS — G9341 Metabolic encephalopathy: Secondary | ICD-10-CM | POA: Diagnosis not present

## 2021-09-15 LAB — GLUCOSE, CAPILLARY
Glucose-Capillary: 108 mg/dL — ABNORMAL HIGH (ref 70–99)
Glucose-Capillary: 100 mg/dL — ABNORMAL HIGH (ref 70–99)
Glucose-Capillary: 120 mg/dL — ABNORMAL HIGH (ref 70–99)
Glucose-Capillary: 92 mg/dL (ref 70–99)
Glucose-Capillary: 130 mg/dL — ABNORMAL HIGH (ref 70–99)
Glucose-Capillary: 103 mg/dL — ABNORMAL HIGH (ref 70–99)

## 2021-09-15 NOTE — Progress Notes (Signed)
? ?Providing Compassionate, Quality Care - Together ? ? ?Subjective: ?Patient reports some muscle spasms this morning in BUE. He reports that he had a good session with PT yesterday and was very pleased with his progress. He still expresses interest in discharging home. He would like to work towards meeting his calorie needs through PO intake as opposed to tube feeds. He reports that he is a picky eater and does not care for the hospital food. ? ?Objective: ?Vital signs in last 24 hours: ?Temp:  [97.7 ?F (36.5 ?C)-98.5 ?F (36.9 ?C)] 97.8 ?F (36.6 ?C) (04/22 [id number]) ?Pulse Rate:  [86-100] 86 (04/22 0816) ?Resp:  [16-18] 18 (04/22 0816) ?BP: (101-121)/(77-82) 115/82 (04/22 [id number]) ?SpO2:  [100 %] 100 % (04/22 0816) ? ?Intake/Output from previous day: ?04/21 0701 - 04/22 0700 ?In: 2049 [P.O.:240; I.V.:690.3; NG/GT:998.8] ?Out: 2450 [Urine:2450] ?Intake/Output this shift: ?No intake/output data recorded. ? ?Alert; oriented to person, place, and time ?MAE, Generalized weakness ?Decreased fine motor BUE ?Incision is well-healed ? ?Lab Results: ?Recent Labs  ?  09/13/21 ?[id number]  ?WBC 6.2  ?HGB 9.6*  ?HCT 29.1*  ?PLT 386  ? ?BMET ?Recent Labs  ?  09/13/21 ?[id number]  ?NA 138  ?K 4.2  ?CL 101  ?CO2 29  ?GLUCOSE 105*  ?BUN 21*  ?CREATININE 1.11  ?CALCIUM 10.7*  ? ? ?Studies/Results: ?DG Abd Portable 1V ? ?Result Date: 09/13/2021 ?CLINICAL DATA:  Abdominal distension. EXAM: PORTABLE ABDOMEN - 1 VIEW COMPARISON:  08/17/2021 FINDINGS: Moderate amount of oral contrast material is seen throughout the colon. No evidence of dilated bowel loops. No evidence of extraluminal gas collections. No radio-opaque calculi or other significant radiographic abnormality are seen. IMPRESSION: Nonobstructive bowel gas pattern. Moderate amount of oral contrast material throughout the colon. Electronically Signed   By: Marlaine Hind M.D.   On: 09/13/2021 12:47   ? ?Assessment/Plan: ?Patient status post C3-4, C4-5, C5-6, C6-7 anterior cervical discectomy with  interbody fusion by Dr. Annette Stable on 06/29/2021. Increased difficulty swallowing with lung atelectasis and elevated temperatures on 07/02/2021. Made NPO following MBS by SLP. 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 CTA head and neck negative for stroke. MRI revealed epidural hematoma. Patient underwent exploration of his cervical fusion with evacuation of the epidural hematoma on 07/03/2021. Cortrak was placed on 07/04/2021. Patient's strength much improved since epidural hematoma evacuation. Cortrak removed 07/06/2021 and dysphagia 2 diet with nectar thick liquids started. Patient with delirium vs ETOH withdrawal. Discontinued steroids 07/06/2021. CIWA protocol started and discontinued 07/07/2021. CT and MRI 07/07/2021 negative. Patient developed respiratory distress, requiring intubation on 07/15/2021. Work up revealed aspiration PNA and PE. Patient was extubated on 07/22/2021. Foley catheter removed 07/24/2021. Patient transferred to 3W on 07/26/2021. Foley replaced 07/28/2021. PEG placed 07/30/2021. Foley removed 08/09/2021. PEG pulled out by patient 08/16/2021. Delirium worsening 08/17/2021. Haldol administered. IR replaced PEG on 08/17/2021. Patient with urosepsis 08/18/2021. Culture grew E. Coli. C/o LBP 08/28/2021. Lumbar MRI demonstrated chronic degenerative changes with spinal stenosis at L3-4, L4-5, and L5-S1. Patient suffered a fall overnight 08/29/2021 without injury. Worsening muscle spasms 09/11/2021, baclofen started. Patient transitioned to regular diet 09/12/2021. Episode of emesis overnight 4/19-4/20. No further episodes of emesis since 4/20. KUB 4/20 just demonstrated a nonobstructive bowel gas pattern. ? ? LOS: 78 days  ? ?  ?-Continue efforts at rehabilitation. ?-Patient would like to discharge home. ? ? ? ?Viona Gilmore, DNP, AGNP-C ?Nurse Practitioner ? ?[facility] ?[address], [address], [place], [place] [address] ?[id number]    F:  [phone] ? ?09/15/2021, 11:26 AM ? ? ? ? ?

## 2021-09-15 NOTE — Progress Notes (Signed)
?PROGRESS NOTE ? ? ? ?Brian [name]  [id number] DOB: [birth year] DOA: 06/29/2021 ?PCP: Pcp, No  ? 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  He was admitted on 2/3 and underwent ACDF of C3-4, C4-5, C5-5, and C6-7.  Post-operatively he was progressing slowly with residual weakness in both hands but improving lower extremity strength and function.  He developed some difficulty swallowing on 2/5 and SLP ordered and placed on thickened liquids.   Overnight 2/6, patient with increased difficulty swallowing and was made NPO but also noted to have dysarthria and difficulty moving extremities with numbness.  SLP evaluated and found to be moderate aspiration risk.   Also progressively tachycardic, tachypneic, and now febrile.  Code stroke activated and taken for CT/ CTA head and neck and was given decadron 10mg  once.  NIHSS 18.  CTH was negative for acute findings, and CTA head neck did not reveal any LVO but noted significant prevertebral soft tissue swelling with foci of gas present at the ventral epidural space at C4-5, small collection not excluded. On 2/7, patient with worsening confusion and concern for airway involvement, PCCM consulted and remained in the intensive care unit until he is transferred to the floor on 3/2 and PCCM requested transition to [facility] for medical assistance while patient remains under the neurosurgery service. ? ?Significant Hospital Events: ?2/3 ACDF C3-4, C4-5, C5-5, and C6-7 w/ Dr. Annette Stable ?2/6 SLP eval for difficulty swallowing ?2/7 Code stroke overnight, neg for LVO, CT showing soft tissue swelling.  PCCM consulted for concern of airway management  and AMS.  Went for evacuation of hematoma  ?2/18 CT Abd/Pelvis: showing bilateral segmental Pes, started on heparin and showing worsening pneumonia, febrile to  103.  PCT rising, 34.6, restarted on abx ?2/19 possible aspiration w/ severe hypoxia; intubated; Switching from heparin to angiomax given subtherapeutic levels despite increase in rate. ?2/20 Echo shows normal LV systolic function. There was notation of a + McConnell's sign c/w a large pulmonary embolus which is consistent with the finding of bilateral segmental pulmonary emboli noted on CT 07/14/2021. ?2/21 rash appreciated on back; stopped cefepim/vanc switched to zosyn; required low dose levo with increase in fentanyl ?2/22 remains intubated; unable to extubate to do increase RR and thick secretions ?2/23 Ketamine infusion added ?2/24: weaning fentanyl.  ?2/26 extubated ?2/28 tachycardia persists ?3/2: Modified barium swallow, continues n.p.o. with core track in place, likely will need PEG tube. ?3/6: s/p IR G-tube placement ?3/16: Foley catheter discontinued ?3/24: E. coli UTI/bacteremia>> cefazolin x7 days per ID ?4/5-4/6 Had a fall overnight and fell out of the bed and was unwitnessed but patient states he rolled out of the bed and did not get hurt. Has fall precautions now. ?4/42fell while trying to reach for his eyeglasses, reports landed on left knee, reports it is a soft fall, denies left knee pain, denies hit his head ?4/14 urine is cloudy, repeat urine culture + Citrobacter Freundii that is Sensitive to Bactrim,  ?4/16 reinserted foley due to recurrent urinary retention  ?medically clear awaiting SNF placement; difficult to place per TOC. ?4/19-4/20 Vomited overnight; is recently started on regular diet with thin liquids and has continuous tube feeds were transitioned to nightly only.  Vomited about 350 cc of tube feed material early this morning.  As needed Zofran given and his TF were stopped.  ?4/21 No Nausea overnight and KUB yesterday and  showed "Nonobstructive bowel gas pattern. Moderate amount of oral contrast material throughout the colon." He was started back on IVF and will now stop after 12 hours given improvement in Nausea and Vomiting ?4/22 - He is feeling well and tolerating diet. Having some hand cramping. Continuing Rehabilitative efforts. ?  ?Assessment and Plan: ?* Cervical myelopathy (HCC); severe cervical spinal stenosis with cord compression s/p ACDF [id number] on 2/3, complicated by epidural hematoma s/p evacuation 2/7 ?-Patient initially presented s/p recent fall with progressive upper and lower extremity weakness and tremors, He was found to have critical multilevel cervical spinal stenosis @ C3-6 with spinal cord compression and spinal cord signal change and underwent ACDF C3-4, C4-5, C5-5, and C6/7 by neurosurgery Dr. Trenton Gammon on 06/29/2021.  Postoperative complicated by epidural hematoma s/p evacuation on 2/7. ?-Continue PT/OT/SLP efforts ?-Awaiting SNF placement per TOC ?-Further as per neurosurgery recommendation. ? ?Acute respiratory failure with hypoxia ([place]) ?-Likely Multifactorial with significant dysphagia and recurrent aspiration pneumonia events during initial hospitalization following ACDF surgery with postoperative complications of postoperative cervical hematoma.   ?-Patient did require ventilatory support in the intensive care unit and was successfully extubated on 07/22/2021.  Patient completed extensive course of empiric antibiotics.   ?-SpO2: 100 % ?O2 Flow Rate (L/min): 0 L/min ?FiO2 (%): 40 %; Oxygen now weaned off, on room air. ?-Continue Aspiration Precautions ? ?Acute pulmonary embolism ([place]) ?-CTA chest showed bilateral segmental PE.  ?-LE [place] negative for DVT.   ?-TTE with normal LV systolic function.  Supplemental oxygen weaned off. ?-C/w Eliquis 5 mg BID ? ?Acute metabolic encephalopathy ?-CT head, MRI brain, EEG, TSH, B12, ammonia unrevealing.  Etiology likely multifactorial with acute respiratory failure secondary  to aspiration pneumonia, bilateral pulmonary embolism.  -Completed course of antibiotics for aspiration pneumonia.  Seroquel and clonazepam discontinued.  Completed course of antibiotics for E. coli UTI/septicemia. ?-C/w Supportive care ? ?E. coli bacteremia ?-Urine culture and blood cultures x2 08/17/2028 positive for E. coli.  ?-Evaluated by Infectious Disease.   ?-Completed 7-day course of antibiotics per ID. ? ?Severe sepsis due to recurrent aspiration pneumonia ?-Completed extensive course of antibiotics while under ICU care. S/p IR gtube 3/6. ?-Continue tube feeds nocturnally but may need to hold given that he had Vomiting overnight   ?-Cleared for regular diet with thin liquids per SLP; but with very little oral intake per RN; and patient with little desire for oral intake but he ate well last night ?-Continue aspiration precautions ?-Continue SLP efforts while inpatient ? ?Sinus tachycardia ?-Multifactorial including sepsis, dehydration, PE, agitation.   ?-Continue Metoprolol 12.5 mg twice daily ?-HR improved   ? ?Elevated liver enzymes ?-Etiology likely secondary to sepsis from aspiration pneumonia as above.  Acute hepatitis panel negative.  RUQ ultrasound unremarkable. ?-Continue monitor LFTs intermittently as last AST was normal at 26 and ALT was 20 on last check  ? ?Hyponatremia ?-Etiology likely secondary to dehydration.   ?-Urine sodium low.  Continue tube feeds; now cleared by speech therapy for dysphagia 3 diet. ?-Na+ went from 135 -> 133 -> 138 on last check  ?-Continue to monitor BMP intermittently ? ?Tobacco use disorder ?-Counseled on need for complete cessation.  -Treated with nicotine patch which has now been weaned off. ? ?Physical deconditioning ?-Significant weakness in all extremities as a result of cervical myelopathy and acute illness.  Slowly improving while inpatient. ?-Continue PT/OT efforts ?-Pending SNF placement; difficult placement per TOC and if he improves enough can possibly D/C  home but PT/OT still recommending SNF ? ?Urinary retention ?-Foley catheter discontinued on 08/09/2021; replaced on 09/09/2021 for recurrent urinary retention.   ?-Etiology likely secondary to poor mobili

## 2021-09-16 DIAGNOSIS — G959 Disease of spinal cord, unspecified: Secondary | ICD-10-CM | POA: Diagnosis not present

## 2021-09-16 DIAGNOSIS — G9341 Metabolic encephalopathy: Secondary | ICD-10-CM | POA: Diagnosis not present

## 2021-09-16 DIAGNOSIS — I2601 Septic pulmonary embolism with acute cor pulmonale: Secondary | ICD-10-CM | POA: Diagnosis not present

## 2021-09-16 DIAGNOSIS — J9601 Acute respiratory failure with hypoxia: Secondary | ICD-10-CM | POA: Diagnosis not present

## 2021-09-16 LAB — GLUCOSE, CAPILLARY
Glucose-Capillary: 116 mg/dL — ABNORMAL HIGH (ref 70–99)
Glucose-Capillary: 108 mg/dL — ABNORMAL HIGH (ref 70–99)
Glucose-Capillary: 103 mg/dL — ABNORMAL HIGH (ref 70–99)
Glucose-Capillary: 106 mg/dL — ABNORMAL HIGH (ref 70–99)
Glucose-Capillary: 113 mg/dL — ABNORMAL HIGH (ref 70–99)
Glucose-Capillary: 138 mg/dL — ABNORMAL HIGH (ref 70–99)

## 2021-09-16 NOTE — Progress Notes (Signed)
? ?  Providing Compassionate, Quality Care - Together ? ?Brief hospital course: ?Patient status post C3-4, C4-5, C5-6, C6-7 anterior cervical discectomy with interbody fusion by Dr. Annette Stable on 06/29/2021. Increased difficulty swallowing with lung atelectasis and elevated temperatures on 07/02/2021. Made NPO following MBS by SLP. 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 CTA head and neck negative for stroke. MRI revealed epidural hematoma. Patient underwent exploration of his cervical fusion with evacuation of the epidural hematoma on 07/03/2021. Cortrak was placed on 07/04/2021. Patient's strength much improved since epidural hematoma evacuation. Cortrak removed 07/06/2021 and dysphagia 2 diet with nectar thick liquids started. Patient with delirium vs ETOH withdrawal. Discontinued steroids 07/06/2021. CIWA protocol started and discontinued 07/07/2021. CT and MRI 07/07/2021 negative. Patient developed respiratory distress, requiring intubation on 07/15/2021. Work up revealed aspiration PNA and PE. Patient was extubated on 07/22/2021. Foley catheter removed 07/24/2021. Patient transferred to 3W on 07/26/2021. Foley replaced 07/28/2021. PEG placed 07/30/2021. Foley removed 08/09/2021. PEG pulled out by patient 08/16/2021. Delirium worsening 08/17/2021. Haldol administered. IR replaced PEG on 08/17/2021. Patient with urosepsis 08/18/2021. Culture grew E. Coli. C/o LBP 08/28/2021. Lumbar MRI demonstrated chronic degenerative changes with spinal stenosis at L3-4, L4-5, and L5-S1. Patient suffered a fall overnight 08/29/2021 without injury. Worsening muscle spasms 09/11/2021, baclofen started. Patient transitioned to regular diet 09/12/2021. Episode of emesis overnight 4/19-4/20. No further episodes of emesis since 4/20. KUB 4/20 just demonstrated a nonobstructive bowel gas pattern. ? ?Subjective: ?Patient reports no issues overnight. Denies any episodes of nausea or vomiting. He likes drinking the Boost  and tells me this is where most of his PO calories are coming from while in the hospital. ? ?Objective: ?Vital signs in last 24 hours: ?Temp:  [97.5 ?F (36.4 ?C)-98.2 ?F (36.8 ?C)] 97.7 ?F (36.5 ?C) (04/23 0555) ?Pulse Rate:  [76-100] 76 (04/23 1143) ?Resp:  [16] 16 (04/23 1143) ?BP: (101-132)/(74-85) 101/83 (04/23 1143) ?SpO2:  [100 %] 100 % (04/23 1143) ?Weight:  [53.7 kg] 53.7 kg (04/23 0500) ? ?Intake/Output from previous day: ?04/22 0701 - 04/23 0700 ?In: [id number] [P.O.:50; NG/GT:1020] ?Out: 2200 [Urine:2200] ?Intake/Output this shift: ?No intake/output data recorded. ? ?Alert; oriented to person, place, and time ?MAE, Generalized weakness ?Decreased fine motor BUE ?Incision is well-healed ? ?Lab Results: ?Recent Labs  ?  09/13/21 ?[id number]  ?WBC 6.2  ?HGB 9.6*  ?HCT 29.1*  ?PLT 386  ? ?BMET ?Recent Labs  ?  09/13/21 ?[id number]  ?NA 138  ?K 4.2  ?CL 101  ?CO2 29  ?GLUCOSE 105*  ?BUN 21*  ?CREATININE 1.11  ?CALCIUM 10.7*  ? ? ?Studies/Results: ?No results found. ? ?Assessment/Plan: ?Patient is doing well after a very complicated hospital course following four level ACDF. He would like to discharge home. Continue efforts at mobilization. ? ? ? LOS: 79 days  ? ? ? ?Viona Gilmore, DNP, AGNP-C ?Nurse Practitioner ? ?[facility] ?[address], [address], [place], [place] [address] ?PRN:[id number]    F: [phone] ? ?09/16/2021, 11:58 AM ? ? ? ? ?

## 2021-09-16 NOTE — Progress Notes (Signed)
TRH night cross cover note: ? ?I was notified by RN that the patient experienced an unwitnessed fall without any report of loss of consciousness or hitting of head.  Patient denies any resultant acute arthralgias or myalgias or additional acute injury.  Reported Vital signs stable. I confirmed order for fall precautions.   ? ? ? ? ?Newton Pigg, DO ?Hospitalist ? ?

## 2021-09-16 NOTE — Progress Notes (Signed)
?PROGRESS NOTE ? ? ? ?Brian [name]  [id number] DOB: [birth year] DOA: 06/29/2021 ?PCP: Pcp, No  ? 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  He was admitted on 2/3 and underwent ACDF of C3-4, C4-5, C5-5, and C6-7.  Post-operatively he was progressing slowly with residual weakness in both hands but improving lower extremity strength and function.  He developed some difficulty swallowing on 2/5 and SLP ordered and placed on thickened liquids.   Overnight 2/6, patient with increased difficulty swallowing and was made NPO but also noted to have dysarthria and difficulty moving extremities with numbness.  SLP evaluated and found to be moderate aspiration risk.   Also progressively tachycardic, tachypneic, and now febrile.  Code stroke activated and taken for CT/ CTA head and neck and was given decadron 10mg  once.  NIHSS 18.  CTH was negative for acute findings, and CTA head neck did not reveal any LVO but noted significant prevertebral soft tissue swelling with foci of gas present at the ventral epidural space at C4-5, small collection not excluded. On 2/7, patient with worsening confusion and concern for airway involvement, PCCM consulted and remained in the intensive care unit until he is transferred to the floor on 3/2 and PCCM requested transition to [facility] for medical assistance while patient remains under the neurosurgery service. ? ?Significant Hospital Events: ?2/3 ACDF C3-4, C4-5, C5-5, and C6-7 w/ Dr. Annette Stable ?2/6 SLP eval for difficulty swallowing ?2/7 Code stroke overnight, neg for LVO, CT showing soft tissue swelling.  PCCM consulted for concern of airway management  and AMS.  Went for evacuation of hematoma  ?2/18 CT Abd/Pelvis: showing bilateral segmental Pes, started on heparin and showing worsening pneumonia, febrile to  103.  PCT rising, 34.6, restarted on abx ?2/19 possible aspiration w/ severe hypoxia; intubated; Switching from heparin to angiomax given subtherapeutic levels despite increase in rate. ?2/20 Echo shows normal LV systolic function. There was notation of a + McConnell's sign c/w a large pulmonary embolus which is consistent with the finding of bilateral segmental pulmonary emboli noted on CT 07/14/2021. ?2/21 rash appreciated on back; stopped cefepim/vanc switched to zosyn; required low dose levo with increase in fentanyl ?2/22 remains intubated; unable to extubate to do increase RR and thick secretions ?2/23 Ketamine infusion added ?2/24: weaning fentanyl.  ?2/26 extubated ?2/28 tachycardia persists ?3/2: Modified barium swallow, continues n.p.o. with core track in place, likely will need PEG tube. ?3/6: s/p IR G-tube placement ?3/16: Foley catheter discontinued ?3/24: E. coli UTI/bacteremia>> cefazolin x7 days per ID ?4/5-4/6 Had a fall overnight and fell out of the bed and was unwitnessed but patient states he rolled out of the bed and did not get hurt. Has fall precautions now. ?4/23fell while trying to reach for his eyeglasses, reports landed on left knee, reports it is a soft fall, denies left knee pain, denies hit his head ?4/14 urine is cloudy, repeat urine culture + Citrobacter Freundii that is Sensitive to Bactrim,  ?4/16 reinserted foley due to recurrent urinary retention  ?medically clear awaiting SNF placement; difficult to place per TOC. ?4/19-4/20 Vomited overnight; is recently started on regular diet with thin liquids and has continuous tube feeds were transitioned to nightly only.  Vomited about 350 cc of tube feed material early this morning.  As needed Zofran given and his TF were stopped.  ?4/21 No Nausea overnight and KUB yesterday and  showed "Nonobstructive bowel gas pattern. Moderate amount of oral contrast material throughout the colon." He was started back on IVF and will now stop after 12 hours given improvement in Nausea and Vomiting ?4/22 - He is feeling well and tolerating diet. Having some hand cramping. Continuing Rehabilitative efforts. ?4/23 - Exercising in the bed and wanting to sit in the chair. Continues to have some Hand Cramping.  ?  ?Assessment and Plan: ?* Cervical myelopathy (HCC); severe cervical spinal stenosis with cord compression s/p ACDF C3-7 on 2/3, complicated by epidural hematoma s/p evacuation 2/7 ?-Patient initially presented s/p recent fall with progressive upper and lower extremity weakness and tremors, He was found to have critical multilevel cervical spinal stenosis @ C3-6 with spinal cord compression and spinal cord signal change and underwent ACDF C3-4, C4-5, C5-5, and C6/7 by neurosurgery Dr. Dutch Quint on 06/29/2021.  Postoperative complicated by epidural hematoma s/p evacuation on 2/7. ?-Neurosurgery feels he is doing well after a complicated Hospital course following Four Level ACDF ?-Continue PT/OT/SLP efforts ?-Awaiting SNF placement per TOC ?-Further as per neurosurgery recommendation. ? ?Acute respiratory failure with hypoxia (HCC) ?-Likely Multifactorial with significant dysphagia and recurrent aspiration pneumonia events during initial hospitalization following ACDF surgery with postoperative complications of postoperative cervical hematoma.   ?-Patient did require ventilatory support in the intensive care unit and was successfully extubated on 07/22/2021.  Patient completed extensive course of empiric antibiotics.   ?-SpO2: 100 % ?O2 Flow Rate (L/min): 0 L/min ?FiO2 (%): 40 %; Oxygen now weaned off, on room air. ?-Continue Aspiration Precautions ? ?Acute pulmonary embolism (HCC) ?-CTA chest showed bilateral segmental PE.  ?-LE [place] negative for DVT.   ?-TTE with normal LV systolic function.  Supplemental  oxygen weaned off. ?-C/w Eliquis 5 mg BID ? ?Acute metabolic encephalopathy ?-CT head, MRI brain, EEG, TSH, B12, ammonia unrevealing.  Etiology likely multifactorial with acute respiratory failure secondary to aspiration pneumonia, bilateral pulmonary embolism.  -Completed course of antibiotics for aspiration pneumonia.  Seroquel and clonazepam discontinued.  Completed course of antibiotics for E. coli UTI/septicemia. ?-C/w Supportive care ? ?E. coli bacteremia ?-Urine culture and blood cultures x2 08/17/2028 positive for E. coli.  ?-Evaluated by Infectious Disease.   ?-Completed 7-day course of antibiotics per ID. ? ?Severe sepsis due to recurrent aspiration pneumonia ?-Completed extensive course of antibiotics while under ICU care. S/p IR gtube 3/6. ?-Continue tube feeds nocturnally but may need to hold given that he had Vomiting overnight   ?-Cleared for regular diet with thin liquids per SLP; but with very little oral intake per RN; and patient with little desire for oral intake but he ate well last night ?-Continue aspiration precautions ?-Continue SLP efforts while inpatient ? ?Sinus tachycardia ?-Multifactorial including sepsis, dehydration, PE, agitation.   ?-Continue Metoprolol 12.5 mg twice daily ?-HR improved   ? ?Elevated liver enzymes ?-Etiology likely secondary to sepsis from aspiration pneumonia as above.  Acute hepatitis panel negative.  RUQ ultrasound unremarkable. ?-Continue monitor LFTs intermittently as last AST was normal at 26 and ALT was 20 on last check  ? ?Hyponatremia ?-Etiology likely secondary to dehydration.   ?-Urine sodium low.  Continue tube feeds; now cleared by speech therapy for dysphagia 3 diet. ?-Na+ went from 135 -> 133 -> 138 on last check  ?-Continue to monitor BMP intermittently ? ?Tobacco use  disorder ?-Counseled on need for complete cessation.  -Treated with nicotine patch which has now been weaned off. ? ?Physical deconditioning ?-Significant weakness in all extremities as  a result of cervical myelopathy and acute illness.  Slowly improving while inpatient. ?-Continue PT/OT efforts ?-Pending SNF placement; difficult placement per TOC and if he improves enough can possibly D/C

## 2021-09-17 DIAGNOSIS — G9341 Metabolic encephalopathy: Secondary | ICD-10-CM | POA: Diagnosis not present

## 2021-09-17 DIAGNOSIS — I2601 Septic pulmonary embolism with acute cor pulmonale: Secondary | ICD-10-CM | POA: Diagnosis not present

## 2021-09-17 DIAGNOSIS — G959 Disease of spinal cord, unspecified: Secondary | ICD-10-CM | POA: Diagnosis not present

## 2021-09-17 DIAGNOSIS — J9601 Acute respiratory failure with hypoxia: Secondary | ICD-10-CM | POA: Diagnosis not present

## 2021-09-17 LAB — CBC
HCT: 30.5 % — ABNORMAL LOW (ref 39.0–52.0)
Hemoglobin: 9.7 g/dL — ABNORMAL LOW (ref 13.0–17.0)
MCH: 30.1 pg (ref 26.0–34.0)
MCHC: 31.8 g/dL (ref 30.0–36.0)
MCV: 94.7 fL (ref 80.0–100.0)
Platelets: 386 10*3/uL (ref 150–400)
RBC: 3.22 MIL/uL — ABNORMAL LOW (ref 4.22–5.81)
RDW: 16.8 % — ABNORMAL HIGH (ref 11.5–15.5)
WBC: 6.4 10*3/uL (ref 4.0–10.5)
nRBC: 0 % (ref 0.0–0.2)

## 2021-09-17 LAB — BASIC METABOLIC PANEL
Anion gap: 7 (ref 5–15)
BUN: 26 mg/dL — ABNORMAL HIGH (ref 6–20)
CO2: 27 mmol/L (ref 22–32)
Calcium: 10.2 mg/dL (ref 8.9–10.3)
Chloride: 100 mmol/L (ref 98–111)
Creatinine, Ser: 0.88 mg/dL (ref 0.61–1.24)
GFR, Estimated: >60 mL/min (ref >60)
Glucose, Bld: 118 mg/dL — ABNORMAL HIGH (ref 70–99)
Potassium: 4.2 mmol/L (ref 3.5–5.1)
Sodium: 134 mmol/L — ABNORMAL LOW (ref 135–145)

## 2021-09-17 LAB — GLUCOSE, CAPILLARY
Glucose-Capillary: 148 mg/dL — ABNORMAL HIGH (ref 70–99)
Glucose-Capillary: 96 mg/dL (ref 70–99)
Glucose-Capillary: 123 mg/dL — ABNORMAL HIGH (ref 70–99)
Glucose-Capillary: 98 mg/dL (ref 70–99)
Glucose-Capillary: 149 mg/dL — ABNORMAL HIGH (ref 70–99)
Glucose-Capillary: 128 mg/dL — ABNORMAL HIGH (ref 70–99)

## 2021-09-17 MED ORDER — BACLOFEN 10 MG PO TABS
20.0000 mg | ORAL_TABLET | Freq: Three times a day (TID) | ORAL | Status: DC
  Administered 2021-09-17 – 2021-10-29 (×125): 20 mg via ORAL
  Filled 2021-09-17 (×125): qty 2

## 2021-09-17 NOTE — Progress Notes (Signed)
? ?  Providing Compassionate, Quality Care - Together ? ?Brief hospital course: ?Patient status post C3-4, C4-5, C5-6, C6-7 anterior cervical discectomy with interbody fusion by Dr. Annette Stable on 06/29/2021. Increased difficulty swallowing with lung atelectasis and elevated temperatures on 07/02/2021. Made NPO following MBS by SLP. 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 CTA head and neck negative for stroke. MRI revealed epidural hematoma. Patient underwent exploration of his cervical fusion with evacuation of the epidural hematoma on 07/03/2021. Cortrak was placed on 07/04/2021. Patient's strength much improved since epidural hematoma evacuation. Cortrak removed 07/06/2021 and dysphagia 2 diet with nectar thick liquids started. Patient with delirium vs ETOH withdrawal. Discontinued steroids 07/06/2021. CIWA protocol started and discontinued 07/07/2021. CT and MRI 07/07/2021 negative. Patient developed respiratory distress, requiring intubation on 07/15/2021. Work up revealed aspiration PNA and PE. Patient was extubated on 07/22/2021. Foley catheter removed 07/24/2021. Patient transferred to 3W on 07/26/2021. Foley replaced 07/28/2021. PEG placed 07/30/2021. Foley removed 08/09/2021. PEG pulled out by patient 08/16/2021. Delirium worsening 08/17/2021. Haldol administered. IR replaced PEG on 08/17/2021. Patient with urosepsis 08/18/2021. Culture grew E. Coli. C/o LBP 08/28/2021. Lumbar MRI demonstrated chronic degenerative changes with spinal stenosis at L3-4, L4-5, and L5-S1. Patient suffered a fall overnight 08/29/2021 without injury. Worsening muscle spasms 09/11/2021, baclofen started. Patient transitioned to regular diet 09/12/2021. Episode of emesis overnight 4/19-4/20. No further episodes of emesis since 4/20. KUB 4/20 just demonstrated a nonobstructive bowel gas pattern. Fall without injury reported 09/16/2021. ? ?Subjective: ?Patient reports persistent muscle cramping in BUE and  BLE. ? ?Objective: ?Vital signs in last 24 hours: ?Temp:  [98 ?F (36.7 ?C)-98.5 ?F (36.9 ?C)] 98.1 ?F (36.7 ?C) (04/24 0800) ?Pulse Rate:  [70-100] 98 (04/24 0800) ?Resp:  [14-16] 14 (04/24 0800) ?BP: (100-119)/(76-87) 119/80 (04/24 0800) ?SpO2:  [79 %-100 %] 100 % (04/24 0800) ?Weight:  [54.4 kg] 54.4 kg (04/24 0500) ? ?Intake/Output from previous day: ?04/23 0701 - 04/24 0700 ?In: 1700 [NG/GT:1530] ?Out: -  ?Intake/Output this shift: ?Total I/O ?In: -  ?Out: 825 [Urine:825] ? ?Alert; oriented to person, place, and time ?MAE, Generalized weakness ?Decreased fine motor BUE ?Incision is well-healed ? ?Lab Results: ?Recent Labs  ?  09/17/21 ?0552  ?WBC 6.4  ?HGB 9.7*  ?HCT 30.5*  ?PLT 386  ? ?BMET ?Recent Labs  ?  09/17/21 ?0552  ?NA 134*  ?K 4.2  ?CL 100  ?CO2 27  ?GLUCOSE 118*  ?BUN 26*  ?CREATININE 0.88  ?CALCIUM 10.2  ? ? ?Studies/Results: ?No results found. ? ?Assessment/Plan: ?Patient is doing well after a very complicated hospital course following four level ACDF. He would like to discharge home. Continue efforts at mobilization. ? ? LOS: 80 days  ? ? ? ?Viona Gilmore, DNP, AGNP-C ?Nurse Practitioner ? ?[facility] ?[address], [place], [place] [address] ?PRN:[id number]    F: [phone] ? ?09/17/2021, 10:23 AM ? ? ? ? ?

## 2021-09-17 NOTE — Progress Notes (Signed)
?PROGRESS NOTE ? ? ?BLAZEN GEORGIADIS  [id number] DOB: [birth year] DOA: 06/29/2021 ?PCP: Pcp, No  ? 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  He was admitted on 2/3 and underwent ACDF of C3-4, C4-5, C5-5, and C6-7.  Post-operatively he was progressing slowly with residual weakness in both hands but improving lower extremity strength and function.  He developed some difficulty swallowing on 2/5 and SLP ordered and placed on thickened liquids.   Overnight 2/6, patient with increased difficulty swallowing and was made NPO but also noted to have dysarthria and difficulty moving extremities with numbness.  SLP evaluated and found to be moderate aspiration risk.   Also progressively tachycardic, tachypneic, and now febrile.  Code stroke activated and taken for CT/ CTA head and neck and was given decadron 10mg  once.  NIHSS 18.  CTH was negative for acute findings, and CTA head neck did not reveal any LVO but noted significant prevertebral soft tissue swelling with foci of gas present at the ventral epidural space at C4-5, small collection not excluded. On 2/7, patient with worsening confusion and concern for airway involvement, PCCM consulted and remained in the intensive care unit until he is transferred to the floor on 3/2 and PCCM requested transition to [facility] for medical assistance while patient remains under the neurosurgery service. ? ?Significant Hospital Events: ?2/3 ACDF C3-4, C4-5, C5-5, and C6-7 w/ Dr. Annette Stable ?2/6 SLP eval for difficulty swallowing ?2/7 Code stroke overnight, neg for LVO, CT showing soft tissue swelling.  PCCM consulted for concern of airway management  and AMS.  Went for evacuation of hematoma  ?2/18 CT Abd/Pelvis: showing bilateral segmental Pes, started on heparin and showing worsening pneumonia, febrile to  103.  PCT rising, 34.6, restarted on abx ?2/19 possible aspiration w/ severe hypoxia; intubated; Switching from heparin to angiomax given subtherapeutic levels despite increase in rate. ?2/20 Echo shows normal LV systolic function. There was notation of a + McConnell's sign c/w a large pulmonary embolus which is consistent with the finding of bilateral segmental pulmonary emboli noted on CT 07/14/2021. ?2/21 rash appreciated on back; stopped cefepim/vanc switched to zosyn; required low dose levo with increase in fentanyl ?2/22 remains intubated; unable to extubate to do increase RR and thick secretions ?2/23 Ketamine infusion added ?2/24: weaning fentanyl.  ?2/26 extubated ?2/28 tachycardia persists ?3/2: Modified barium swallow, continues n.p.o. with core track in place, likely will need PEG tube. ?3/6: s/p IR G-tube placement ?3/16: Foley catheter discontinued ?3/24: E. coli UTI/bacteremia>> cefazolin x7 days per ID ?4/5-4/6 Had a fall overnight and fell out of the bed and was unwitnessed but patient states he rolled out of the bed and did not get hurt. Has fall precautions now. ?4/74fell while trying to reach for his eyeglasses, reports landed on left knee, reports it is a soft fall, denies left knee pain, denies hit his head ?4/14 urine is cloudy, repeat urine culture + Citrobacter Freundii that is Sensitive to Bactrim,  ?4/16 reinserted foley due to recurrent urinary retention  ?medically clear awaiting SNF placement; difficult to place per TOC. ?4/19-4/20 Vomited overnight; is recently started on regular diet with thin liquids and has continuous tube feeds were transitioned to nightly only.  Vomited about 350 cc of tube feed material early this morning.  As needed Zofran given and his TF were stopped.  ?4/21 No Nausea overnight and KUB yesterday and  showed "Nonobstructive bowel gas pattern. Moderate amount of oral contrast material throughout the colon." He was started back on IVF and will now stop after 12 hours given improvement in Nausea and Vomiting ?4/22 - He is feeling well and tolerating diet. Having some hand cramping. Continuing Rehabilitative efforts. ?4/23 - Exercising in the bed and wanting to sit in the chair. Continues to have some Hand Cramping. Had an unwitnessed fall without loss of consciousness or hittne head.  ?  ?Assessment and Plan: ?* Cervical myelopathy (HCC); severe cervical spinal stenosis with cord compression s/p ACDF [id number] on 2/3, complicated by epidural hematoma s/p evacuation 2/7 ?-Patient initially presented s/p recent fall with progressive upper and lower extremity weakness and tremors, He was found to have critical multilevel cervical spinal stenosis @ C3-6 with spinal cord compression and spinal cord signal change and underwent ACDF C3-4, C4-5, C5-5, and C6/7 by neurosurgery Dr. Trenton Gammon on 06/29/2021.  Postoperative complicated by epidural hematoma s/p evacuation on 2/7. ?-Neurosurgery feels he is doing well after a complicated Hospital course following Four Level ACDF ?-Continue PT/OT/SLP efforts ?-Awaiting SNF placement per TOC ?-Further as per neurosurgery recommendation. ? ?Acute respiratory failure with hypoxia ([place]) ?-Likely Multifactorial with significant dysphagia and recurrent aspiration pneumonia events during initial hospitalization following ACDF surgery with postoperative complications of postoperative cervical hematoma.   ?-Patient did require ventilatory support in the intensive care unit and was successfully extubated on 07/22/2021.  Patient completed extensive course of empiric antibiotics.   ?-SpO2: 100 % ?O2 Flow Rate (L/min): 0 L/min ?FiO2 (%): 40 %; Oxygen now weaned off, on room air. ?-Continue Aspiration Precautions ? ?Acute pulmonary embolism ([place]) ?-CTA chest showed bilateral segmental PE.  ?-LE [place] negative  for DVT.   ?-TTE with normal LV systolic function.  Supplemental oxygen weaned off. ?-C/w Eliquis 5 mg BID ? ?Acute metabolic encephalopathy ?-CT head, MRI brain, EEG, TSH, B12, ammonia unrevealing.  Etiology likely multifactorial with acute respiratory failure secondary to aspiration pneumonia, bilateral pulmonary embolism.  -Completed course of antibiotics for aspiration pneumonia.  Seroquel and clonazepam discontinued.  Completed course of antibiotics for E. coli UTI/septicemia. ?-C/w Supportive care ? ?E. coli bacteremia ?-Urine culture and blood cultures x2 08/17/2028 positive for E. coli.  ?-Evaluated by Infectious Disease.   ?-Completed 7-day course of antibiotics per ID. ? ?Severe sepsis due to recurrent aspiration pneumonia ?-Completed extensive course of antibiotics while under ICU care. S/p IR gtube 3/6. ?-Continue tube feeds nocturnally but may need to hold given that he had Vomiting overnight   ?-Cleared for regular diet with thin liquids per SLP; but with very little oral intake per RN; and patient with little desire for oral intake but he ate well last night ?-Continue aspiration precautions ?-Continue SLP efforts while inpatient ? ?Sinus tachycardia ?-Multifactorial including sepsis, dehydration, PE, agitation.   ?-Continue Metoprolol 12.5 mg twice daily ?-HR improved   ? ?Elevated liver enzymes ?-Etiology likely secondary to sepsis from aspiration pneumonia as above.  Acute hepatitis panel negative.  RUQ ultrasound unremarkable. ?-Continue monitor LFTs intermittently as last AST was normal at 26 and ALT was 20 on last check  ? ?Hyponatremia ?-Etiology likely secondary to dehydration.   ?-Urine sodium low.  Continue tube feeds; now cleared by speech therapy for dysphagia 3 diet. ?-Na+ went from 135 -> 133 -> 138 ->  134 on last check  ?-Continue to monitor BMP intermittently ? ?Tobacco use disorder ?-Counseled on need for complete cessation.  -Treated with Nicotine Patch which has now been weaned  off. ? ?Physical deconditioning ?-Significant weakness in all extremities as a result of cervical myelopathy and acute illness.  Slowly improving while inpatient. ?-Continue PT/OT efforts ?-Pending SNF placeme

## 2021-09-17 NOTE — Progress Notes (Signed)
Physical Therapy Treatment ?Patient Details ?Name: Brian [name] ?MRN: [id number] ?DOB: Oct 11, 1960 ?Today's Date: 09/17/2021 ? ? ?History of Present Illness Pt is a 61 y/o male admitted 06/29/21 following C3-7 ACDF after progressive cervical myelopathy symptoms including ataxia, bil UE and LE shaking and weakness, and loss of function in his hands with subsequent fall. Pt initially had improvement in neurological symptoms, but postoperative course was complicated by dysphasia and ataxia secondary to and epidural hematoma with evacuation of hematoma on 2/7. Course complicated by delirium secondary to encephalopathy, sepsis secondary to aspiration PNA, reintubated 2/19-2/26 for airway protection, bilat segmental PEs 2/18. PEG placed 3/6. PMHx includes tobacco and EtOH use disorder. ? ?  ?PT Comments  ? ? Pt progressing steadily toward goals.  Pt was disappointed in having not done as well today as Friday.  Emphasis on safety and technique with sit to stands and progression of gait stability/quality and stamina. ?   ?Recommendations for follow up therapy are one component of a multi-disciplinary discharge planning process, led by the attending physician.  Recommendations may be updated based on patient status, additional functional criteria and insurance authorization. ? ?Follow Up Recommendations ? Acute inpatient rehab (3hours/day) ?  ?  ?Assistance Recommended at Discharge Frequent or constant Supervision/Assistance  ?Patient can return home with the following Two people to help with walking and/or transfers;A lot of help with bathing/dressing/bathroom;Assistance with cooking/housework;Assistance with feeding;Direct supervision/assist for medications management;Direct supervision/assist for financial management;Assist for transportation;Help with stairs or ramp for entrance ?  ?Equipment Recommendations ? Rolling walker (2 wheels);Wheelchair (measurements PT);Wheelchair cushion (measurements PT)  ?  ?Recommendations  for Other Services   ? ? ?  ?Precautions / Restrictions Precautions ?Precautions: Fall  ?  ? ?Mobility ? Bed Mobility ?Overal bed mobility: Needs Assistance ?Bed Mobility: Sit to Supine ?  ?  ?  ?Sit to supine: Min guard ?  ?General bed mobility comments: pt took 2 trials to get LE's into bed, positioned himself without any assist ?  ? ?Transfers ?Overall transfer level: Needs assistance ?  ?Transfers: Sit to/from Stand ?Sit to Stand: Min assist ?  ?  ?  ?  ?  ?General transfer comment: sit to stand x7 between trials of gait and standing ?  ? ?Ambulation/Gait ?Ambulation/Gait assistance: Min assist, Mod assist ?Gait Distance (Feet): 40 Feet (x2, 50 feet x2 and 35 feet back to the bed) ?Assistive device: Rolling walker (2 wheels) ?Gait Pattern/deviations: Step-through pattern, Scissoring, Festinating ?  ?Gait velocity interpretation: 1.31 - 2.62 ft/sec, indicative of limited community ambulator ?  ?General Gait Details: generally ataxic and unsteady, better when pt able to see his feet, but with fatigue or low focus, pt's L foot strikes R LE with swing though and at other times, pt clears with a more normal step width. ? ? ?Stairs ?  ?  ?  ?  ?  ? ? ?Wheelchair Mobility ?  ? ?Modified Rankin (Stroke Patients Only) ?Modified Rankin (Stroke Patients Only) ?Pre-Morbid Rankin Score: No symptoms ? ? ?  ?Balance   ?Sitting-balance support: No upper extremity supported, Feet supported ?Sitting balance-Leahy Scale: Fair ?  ?  ?Standing balance support: Bilateral upper extremity supported, Reliant on assistive device for balance ?Standing balance-Leahy Scale: Poor ?Standing balance comment: minG and BUE support of walker ?  ?  ?  ?  ?  ?  ?  ?  ?  ?  ?  ?  ? ?  ?Cognition Arousal/Alertness: Awake/alert ?Behavior During Therapy: [facility] for tasks assessed/performed ?  Overall Cognitive Status: Within Functional Limits for tasks assessed (NT formally) ?  ?  ?  ?  ?  ?  ?  ?  ?  ?  ?  ?  ?  ?  ?  ?  ?  ?  ?  ? ?  ?Exercises General  Exercises - Lower Extremity ?Heel Raises: Left ? ?  ?General Comments   ?  ?  ? ?Pertinent Vitals/Pain Pain Assessment ?Pain Assessment: Faces ?Faces Pain Scale: Hurts a little bit ?Pain Intervention(s): Monitored during session  ? ? ?Home Living   ?  ?  ?  ?  ?  ?  ?  ?  ?  ?   ?  ?Prior Function    ?  ?  ?   ? ?PT Goals (current goals can now be found in the care plan section) Acute Rehab PT Goals ?Patient Stated Goal: home ?PT Goal Formulation: With patient ?Time For Goal Achievement: 09/17/21 ?Potential to Achieve Goals: Good ?Progress towards PT goals: Progressing toward goals (goals updated) ? ?  ?Frequency ? ? ? Min 5X/week ? ? ? ?  ?PT Plan Current plan remains appropriate  ? ? ?Co-evaluation   ?  ?  ?  ?  ? ?  ?AM-PAC PT "6 Clicks" Mobility   ?Outcome Measure ? Help needed turning from your back to your side while in a flat bed without using bedrails?: A Little ?Help needed moving from lying on your back to sitting on the side of a flat bed without using bedrails?: A Little ?Help needed moving to and from a bed to a chair (including a wheelchair)?: A Lot ?Help needed standing up from a chair using your arms (e.g., wheelchair or bedside chair)?: A Little ?Help needed to walk in hospital room?: A Lot ?Help needed climbing 3-5 steps with a railing? : A Lot ?6 Click Score: 15 ? ?  ?End of Session   ?Activity Tolerance: Patient tolerated treatment well ?Patient left: in chair;with call bell/phone within reach;with chair alarm set ?Nurse Communication: Mobility status ?PT Visit Diagnosis: Muscle weakness (generalized) (M62.81);Other symptoms and signs involving the nervous system (R29.898);Difficulty in walking, not elsewhere classified (R26.2);Other abnormalities of gait and mobility (R26.89) ?  ? ? ?Time: [id number] ?PT Time Calculation (min) (ACUTE ONLY): 41 min ? ?Charges:  $Gait Training: 23-37 mins ?$Therapeutic Activity: 23-37 mins          ?          ? ?09/17/2021 ? ?Ginger Carne., PT ?Acute Rehabilitation  Services ?[phone]  (pager) ?[phone]  (office) ? ? ?[name] [place] ?09/17/2021, 4:41 PM ? ?

## 2021-09-18 DIAGNOSIS — J9601 Acute respiratory failure with hypoxia: Secondary | ICD-10-CM | POA: Diagnosis not present

## 2021-09-18 DIAGNOSIS — G9341 Metabolic encephalopathy: Secondary | ICD-10-CM | POA: Diagnosis not present

## 2021-09-18 DIAGNOSIS — I2601 Septic pulmonary embolism with acute cor pulmonale: Secondary | ICD-10-CM | POA: Diagnosis not present

## 2021-09-18 DIAGNOSIS — G959 Disease of spinal cord, unspecified: Secondary | ICD-10-CM | POA: Diagnosis not present

## 2021-09-18 LAB — CBC WITH DIFFERENTIAL/PLATELET
Abs Immature Granulocytes: 0.01 10*3/uL (ref 0.00–0.07)
Basophils Absolute: 0 10*3/uL (ref 0.0–0.1)
Basophils Relative: 0 %
Eosinophils Absolute: 0.3 10*3/uL (ref 0.0–0.5)
Eosinophils Relative: 4 %
HCT: 28.7 % — ABNORMAL LOW (ref 39.0–52.0)
Hemoglobin: 9.8 g/dL — ABNORMAL LOW (ref 13.0–17.0)
Immature Granulocytes: 0 %
Lymphocytes Relative: 51 %
Lymphs Abs: 3.4 10*3/uL (ref 0.7–4.0)
MCH: 31.5 pg (ref 26.0–34.0)
MCHC: 34.1 g/dL (ref 30.0–36.0)
MCV: 92.3 fL (ref 80.0–100.0)
Monocytes Absolute: 0.7 10*3/uL (ref 0.1–1.0)
Monocytes Relative: 11 %
Neutro Abs: 2.2 10*3/uL (ref 1.7–7.7)
Neutrophils Relative %: 34 %
Platelets: 348 10*3/uL (ref 150–400)
RBC: 3.11 MIL/uL — ABNORMAL LOW (ref 4.22–5.81)
RDW: 16.9 % — ABNORMAL HIGH (ref 11.5–15.5)
WBC: 6.7 10*3/uL (ref 4.0–10.5)
nRBC: 0 % (ref 0.0–0.2)

## 2021-09-18 LAB — GLUCOSE, CAPILLARY
Glucose-Capillary: 114 mg/dL — ABNORMAL HIGH (ref 70–99)
Glucose-Capillary: 109 mg/dL — ABNORMAL HIGH (ref 70–99)
Glucose-Capillary: 112 mg/dL — ABNORMAL HIGH (ref 70–99)
Glucose-Capillary: 126 mg/dL — ABNORMAL HIGH (ref 70–99)
Glucose-Capillary: 113 mg/dL — ABNORMAL HIGH (ref 70–99)
Glucose-Capillary: 121 mg/dL — ABNORMAL HIGH (ref 70–99)

## 2021-09-18 LAB — COMPREHENSIVE METABOLIC PANEL
ALT: 19 U/L (ref 0–44)
AST: 28 U/L (ref 15–41)
Albumin: 3.4 g/dL — ABNORMAL LOW (ref 3.5–5.0)
Alkaline Phosphatase: 110 U/L (ref 38–126)
Anion gap: 8 (ref 5–15)
BUN: 33 mg/dL — ABNORMAL HIGH (ref 6–20)
CO2: 25 mmol/L (ref 22–32)
Calcium: 10.3 mg/dL (ref 8.9–10.3)
Chloride: 100 mmol/L (ref 98–111)
Creatinine, Ser: 0.93 mg/dL (ref 0.61–1.24)
GFR, Estimated: >60 mL/min (ref >60)
Glucose, Bld: 97 mg/dL (ref 70–99)
Potassium: 4.3 mmol/L (ref 3.5–5.1)
Sodium: 133 mmol/L — ABNORMAL LOW (ref 135–145)
Total Bilirubin: 0.5 mg/dL (ref 0.3–1.2)
Total Protein: 7.6 g/dL (ref 6.5–8.1)

## 2021-09-18 LAB — MAGNESIUM: Magnesium: 1.9 mg/dL (ref 1.7–2.4)

## 2021-09-18 LAB — PHOSPHORUS: Phosphorus: 3.8 mg/dL (ref 2.5–4.6)

## 2021-09-18 NOTE — Progress Notes (Signed)
Occupational Therapy Treatment ?Patient Details ?Name: Brian [name] ?MRN: [id number] ?DOB: 05-May-1961 ?Today's Date: 09/18/2021 ? ? ?History of present illness Pt is a 61 y/o male admitted 06/29/21 following C3-7 ACDF after progressive cervical myelopathy symptoms including ataxia, bil UE and LE shaking and weakness, and loss of function in his hands with subsequent fall. Pt initially had improvement in neurological symptoms, but postoperative course was complicated by dysphasia and ataxia secondary to and epidural hematoma with evacuation of hematoma on 2/7. Course complicated by delirium secondary to encephalopathy, sepsis secondary to aspiration PNA, reintubated 2/19-2/26 for airway protection, bilat segmental PEs 2/18. PEG placed 3/6. PMHx includes tobacco and EtOH use disorder. ?  ?OT comments ? Patient received in supine and asking for assistance with urinal. Patient was able to get to EOB with use of rail and supervision. Patient assisted with pulling up compression stockings and socks with mod/max assist. Patient insisted on attempting transfer to recliner unassisted but min assist provided for safety. Patient performed grooming tasks and therapy putty exercises while seated. Patient performed static standing from recliner to RW with min assist and min guard for balance. Patient making good progress and will continue to be followed by acute OT.   ? ?Recommendations for follow up therapy are one component of a multi-disciplinary discharge planning process, led by the attending physician.  Recommendations may be updated based on patient status, additional functional criteria and insurance authorization. ?   ?Follow Up Recommendations ? Skilled nursing-short term rehab (<3 hours/day)  ?  ?Assistance Recommended at Discharge Frequent or constant Supervision/Assistance  ?Patient can return home with the following ? Two people to help with walking and/or transfers;Direct supervision/assist for medications  management;Assist for transportation;Help with stairs or ramp for entrance;Assistance with feeding;Two people to help with bathing/dressing/bathroom;Assistance with cooking/housework;Direct supervision/assist for financial management ?  ?Equipment Recommendations ? Wheelchair (measurements OT);Wheelchair cushion (measurements OT);Hospital bed;BSC/3in1;Tub/shower seat  ?  ?Recommendations for Other Services   ? ?  ?Precautions / Restrictions Precautions ?Precautions: Fall ?Restrictions ?Weight Bearing Restrictions: No  ? ? ?  ? ?Mobility Bed Mobility ?Overal bed mobility: Needs Assistance ?Bed Mobility: Supine to Sit ?  ?  ?Supine to sit: Supervision ?  ?  ?General bed mobility comments: able to get self to EOB with use of rails ?  ? ?Transfers ?Overall transfer level: Needs assistance ?Equipment used: None ?Transfers: Sit to/from Stand, Bed to chair/wheelchair/BSC ?Sit to Stand: Min assist ?  ?Squat pivot transfers: Min assist ?  ?  ?  ?General transfer comment: patient insisting to perform transfer from EOB to recliner alone, min assist provided for safety ?  ?  ?Balance Overall balance assessment: Needs assistance ?Sitting-balance support: No upper extremity supported, Feet supported ?Sitting balance-Leahy Scale: Fair ?Sitting balance - Comments: able to maintain sitting balance on EOB ?  ?Standing balance support: Bilateral upper extremity supported ?Standing balance-Leahy Scale: Poor ?Standing balance comment: able to stand with RW and min guard and UE support with RW ?  ?  ?  ?  ?  ?  ?  ?  ?  ?  ?  ?   ? ?ADL either performed or assessed with clinical judgement  ? ?ADL Overall ADL's : Needs assistance/impaired ?  ?  ?Grooming: Wash/dry hands;Wash/dry face;Supervision/safety;Sitting ?Grooming Details (indicate cue type and reason): seated in recliner ?  ?  ?  ?  ?  ?  ?Lower Body Dressing: Moderate assistance;Maximal assistance;Sit to/from stand ?Lower Body Dressing Details (indicate cue type and reason):  assisted with pulling up compression stockings and socks ?  ?  ?  ?  ?  ?  ?  ?  ?  ? ?Extremity/Trunk Assessment Upper Extremity Assessment ?RUE Deficits / Details: having spasms ?  ?  ?  ?  ?  ? ?Vision   ?  ?  ?Perception   ?  ?Praxis   ?  ? ?Cognition Arousal/Alertness: Awake/alert ?Behavior During Therapy: [facility] for tasks assessed/performed ?Overall Cognitive Status: Within Functional Limits for tasks assessed ?Area of Impairment: Safety/judgement ?  ?  ?  ?  ?  ?  ?  ?  ?  ?  ?  ?  ?Safety/Judgement: Decreased awareness of safety ?  ?  ?General Comments: eager to walk and transfer but requires cues for safety ?  ?  ?   ?Exercises Exercises: Other exercises, General Upper Extremity ?General Exercises - Upper Extremity ?Digit Composite Flexion: Other (comment) (yellow therapy putty) ? ?  ?Shoulder Instructions   ? ? ?  ?General Comments    ? ? ?Pertinent Vitals/ Pain       Pain Assessment ?Pain Assessment: Faces ?Faces Pain Scale: Hurts a little bit ?Pain Location: BUE hand spasms ?Pain Descriptors / Indicators: Spasm ?Pain Intervention(s): Monitored during session ? ?Home Living   ?  ?  ?  ?  ?  ?  ?  ?  ?  ?  ?  ?  ?  ?  ?  ?  ?  ?  ? ?  ?Prior Functioning/Environment    ?  ?  ?  ?   ? ?Frequency ? Min 2X/week  ? ? ? ? ?  ?Progress Toward Goals ? ?OT Goals(current goals can now be found in the care plan section) ? Progress towards OT goals: Progressing toward goals ? ?Acute Rehab OT Goals ?Patient Stated Goal: get better ?OT Goal Formulation: With patient ?Time For Goal Achievement: 09/21/21 ?Potential to Achieve Goals: Good ?ADL Goals ?Pt Will Perform Eating: with min assist;with assist to don/doff brace/orthosis;with adaptive utensils;sitting ?Pt Will Perform Grooming: sitting;with min assist ?Pt Will Perform Upper Body Bathing: with mod assist;sitting ?Pt Will Transfer to Toilet: with min assist;stand pivot transfer;bedside commode ?Pt/caregiver will Perform Home Exercise Program: Increased ROM;Increased  strength;Both right and left upper extremity;With theraputty;With minimal assist;With written HEP provided ?Additional ADL Goal #1: Pt will grasp objects with R hand and bring to mouth 10/10 trials in prep for self feeding ?Additional ADL Goal #2: Pt will grasp/release 5/5 objects with L hand to increase indep with ADLs  ?Plan Discharge plan remains appropriate   ? ?Co-evaluation ? ? ?   ?  ?  ?  ?  ? ?  ?AM-PAC OT "6 Clicks" Daily Activity     ?Outcome Measure ? ? Help from another person eating meals?: A Lot ?Help from another person taking care of personal grooming?: A Lot ?Help from another person toileting, which includes using toliet, bedpan, or urinal?: A Lot ?Help from another person bathing (including washing, rinsing, drying)?: A Lot ?Help from another person to put on and taking off regular upper body clothing?: A Lot ?Help from another person to put on and taking off regular lower body clothing?: A Lot ?6 Click Score: 12 ? ?  ?End of Session Equipment Utilized During Treatment: Gait belt ? ?OT Visit Diagnosis: Other abnormalities of gait and mobility (R26.89);Muscle weakness (generalized) (M62.81);Feeding difficulties (R63.3) ?  ?Activity Tolerance Patient tolerated treatment well ?  ?Patient Left in chair;Other (comment) (  with PT) ?  ?Nurse Communication Mobility status ?  ? ?   ? ?Time: [id number] ?OT Time Calculation (min): 29 min ? ?Charges: OT General Charges ?$OT Visit: 1 Visit ?OT Treatments ?$Self Care/Home Management : 8-22 mins ?$Therapeutic Activity: 8-22 mins ? ?Lodema Hong, OTA ?Acute Rehabilitation Services  ?Pager [phone] ?Office [phone] ? ? ?[place] ?09/18/2021, 2:17 PM ?

## 2021-09-18 NOTE — Progress Notes (Signed)
Physical Therapy Treatment ?Patient Details ?Name: Brian [name] ?MRN: [id number] ?DOB: Jan 31, 1961 ?Today's Date: 09/18/2021 ? ? ?History of Present Illness Pt is a 61 y/o male admitted 06/29/21 following C3-7 ACDF after progressive cervical myelopathy symptoms including ataxia, bil UE and LE shaking and weakness, and loss of function in his hands with subsequent fall. Pt initially had improvement in neurological symptoms, but postoperative course was complicated by dysphasia and ataxia secondary to and epidural hematoma with evacuation of hematoma on 2/7. Course complicated by delirium secondary to encephalopathy, sepsis secondary to aspiration PNA, reintubated 2/19-2/26 for airway protection, bilat segmental PEs 2/18. PEG placed 3/6. PMHx includes tobacco and EtOH use disorder. ? ?  ?PT Comments  ? ? Much improved from last session.  Emphasis on safety in general and safe hand placement for sit to stand, and progression of gait stability/quality in the halls with a RW. ?   ?Recommendations for follow up therapy are one component of a multi-disciplinary discharge planning process, led by the attending physician.  Recommendations may be updated based on patient status, additional functional criteria and insurance authorization. ? ?Follow Up Recommendations ? Acute inpatient rehab (3hours/day) ?  ?  ?Assistance Recommended at Discharge Frequent or constant Supervision/Assistance  ?Patient can return home with the following Two people to help with walking and/or transfers;A lot of help with bathing/dressing/bathroom;Assistance with cooking/housework;Assistance with feeding;Direct supervision/assist for medications management;Direct supervision/assist for financial management;Assist for transportation;Help with stairs or ramp for entrance ?  ?Equipment Recommendations ? Rolling walker (2 wheels);Wheelchair (measurements PT);Wheelchair cushion (measurements PT);Other (comment) (generally TBD)  ?  ?Recommendations for  Other Services   ? ? ?  ?Precautions / Restrictions Precautions ?Precautions: Fall  ?  ? ?Mobility ? Bed Mobility ?  ?  ?  ?  ?  ?  ?  ?General bed mobility comments: OOB on arrival ?  ? ?Transfers ?Overall transfer level: Needs assistance ?Equipment used: None ?Transfers: Sit to/from Stand ?Sit to Stand: Min guard ?  ?  ?  ?  ?  ?General transfer comment: consistent cues for hand placement for ascent/descent ?  ? ?Ambulation/Gait ?Ambulation/Gait assistance: Min assist ?Gait Distance (Feet): 100 Feet (x2 and 75 feet x2 with short rests to regroup from fatigue and degradation of gait pattern) ?Assistive device: Rolling walker (2 wheels) ?Gait Pattern/deviations: Step-through pattern ?Gait velocity: reduced ?Gait velocity interpretation: <1.8 ft/sec, indicate of risk for recurrent falls (often needed to reign him in from a gait cadence he could not control well.) ?  ?General Gait Details: generally unsteady/ataxic gait, steppage, but notably improved over last session with pt able to keep a minimum clearance of gait width not to hit his feet together on swing though. ? ? ?Stairs ?  ?  ?  ?  ?  ? ? ?Wheelchair Mobility ?  ? ?Modified Rankin (Stroke Patients Only) ?  ? ? ?  ?Balance Overall balance assessment: Needs assistance ?Sitting-balance support: No upper extremity supported, Feet supported ?Sitting balance-Leahy Scale: Fair ?Sitting balance - Comments: able to maintain sitting balance on EOB ?  ?Standing balance support: Bilateral upper extremity supported ?Standing balance-Leahy Scale: Poor ?Standing balance comment: able to stand with RW and min guard and UE support with RW ?  ?  ?  ?  ?  ?  ?  ?  ?  ?  ?  ?  ? ?  ?Cognition Arousal/Alertness: Awake/alert ?Behavior During Therapy: [facility] for tasks assessed/performed ?Overall Cognitive Status: Within Functional Limits for tasks assessed ?  ?  ?  ?  ?  ?  ?  ?  ?  ?  ?  ?  ?  ?  ?  ?  ?  ?  ?  ? ?  ?  Exercises   ? ?  ?General Comments   ?  ?  ? ?Pertinent  Vitals/Pain Pain Assessment ?Pain Assessment: Faces ?Faces Pain Scale: No hurt ?Pain Intervention(s): Monitored during session  ? ? ?Home Living   ?  ?  ?  ?  ?  ?  ?  ?  ?  ?   ?  ?Prior Function    ?  ?  ?   ? ?PT Goals (current goals can now be found in the care plan section) Acute Rehab PT Goals ?PT Goal Formulation: With patient ?Time For Goal Achievement: 10/01/21 ?Potential to Achieve Goals: Good ?Progress towards PT goals: Progressing toward goals (update goals) ? ?  ?Frequency ? ? ? Min 5X/week ? ? ? ?  ?PT Plan Current plan remains appropriate  ? ? ?Co-evaluation   ?  ?  ?  ?  ? ?  ?AM-PAC PT "6 Clicks" Mobility   ?Outcome Measure ? Help needed turning from your back to your side while in a flat bed without using bedrails?: A Little ?Help needed moving from lying on your back to sitting on the side of a flat bed without using bedrails?: A Little ?Help needed moving to and from a bed to a chair (including a wheelchair)?: A Lot ?Help needed standing up from a chair using your arms (e.g., wheelchair or bedside chair)?: A Little ?Help needed to walk in hospital room?: A Lot ?Help needed climbing 3-5 steps with a railing? : A Lot ?6 Click Score: 15 ? ?  ?End of Session Equipment Utilized During Treatment: Gait belt ?Activity Tolerance: Patient tolerated treatment well ?Patient left: in chair;with call bell/phone within reach;with chair alarm set ?Nurse Communication: Mobility status ?PT Visit Diagnosis: Muscle weakness (generalized) (M62.81);Other symptoms and signs involving the nervous system (R29.898);Difficulty in walking, not elsewhere classified (R26.2);Other abnormalities of gait and mobility (R26.89) ?  ? ? ?Time: [id number] ?PT Time Calculation (min) (ACUTE ONLY): 23 min ? ?Charges:  $Gait Training: 8-22 mins ?$Therapeutic Activity: 8-22 mins          ?          ? ?09/18/2021 ? ?Ginger Carne., PT ?Acute Rehabilitation Services ?[phone]  (pager) ?[phone]  (office) ? ? ?[name] Marcial Pless ?09/18/2021,  6:40 PM ? ?

## 2021-09-18 NOTE — Plan of Care (Signed)
?  Problem: Education: ?Goal: Ability to verbalize activity precautions or restrictions will improve ?Outcome: Progressing ?Goal: Knowledge of the prescribed therapeutic regimen will improve ?Outcome: Progressing ?Goal: Understanding of discharge needs will improve ?Outcome: Progressing ?  ?Problem: Activity: ?Goal: Ability to avoid complications of mobility impairment will improve ?Outcome: Progressing ?Goal: Ability to tolerate increased activity will improve ?Outcome: Progressing ?Goal: Will remain free from falls ?Outcome: Progressing ?  ?Problem: Bowel/Gastric: ?Goal: Gastrointestinal status for postoperative course will improve ?Outcome: Progressing ?  ?Problem: Clinical Measurements: ?Goal: Ability to maintain clinical measurements within normal limits will improve ?Outcome: Progressing ?Goal: Postoperative complications will be avoided or minimized ?Outcome: Progressing ?Goal: Diagnostic test results will improve ?Outcome: Progressing ?  ?Problem: Pain Management: ?Goal: Pain level will decrease ?Outcome: Progressing ?  ?Problem: Skin Integrity: ?Goal: Will show signs of wound healing ?Outcome: Progressing ?  ?Problem: Health Behavior/Discharge Planning: ?Goal: Identification of resources available to assist in meeting health care needs will improve ?Outcome: Progressing ?  ?Problem: Bladder/Genitourinary: ?Goal: Urinary functional status for postoperative course will improve ?Outcome: Progressing ?  ?Problem: Safety: ?Goal: Ability to remain free from injury will improve ?Outcome: Progressing ?  ?Problem: Education: ?Goal: Knowledge of General Education information will improve ?Description: Including pain rating scale, medication(s)/side effects and non-pharmacologic comfort measures ?Outcome: Progressing ?  ?Problem: Health Behavior/Discharge Planning: ?Goal: Ability to manage health-related needs will improve ?Outcome: Progressing ?  ?Problem: Clinical Measurements: ?Goal: Ability to maintain clinical  measurements within normal limits will improve ?Outcome: Progressing ?Goal: Will remain free from infection ?Outcome: Progressing ?Goal: Diagnostic test results will improve ?Outcome: Progressing ?Goal: Respiratory complications will improve ?Outcome: Progressing ?Goal: Cardiovascular complication will be avoided ?Outcome: Progressing ?  ?Problem: Activity: ?Goal: Risk for activity intolerance will decrease ?Outcome: Progressing ?  ?Problem: Coping: ?Goal: Level of anxiety will decrease ?Outcome: Progressing ?  ?Problem: Elimination: ?Goal: Will not experience complications related to bowel motility ?Outcome: Progressing ?Goal: Will not experience complications related to urinary retention ?Outcome: Progressing ?  ?Problem: Pain Managment: ?Goal: General experience of comfort will improve ?Outcome: Progressing ?  ?Problem: Safety: ?Goal: Ability to remain free from injury will improve ?Outcome: Progressing ?  ?Problem: Skin Integrity: ?Goal: Risk for impaired skin integrity will decrease ?Outcome: Progressing ?  ?

## 2021-09-18 NOTE — Progress Notes (Signed)
? ?  Providing Compassionate, Quality Care - Together ? ?Brief hospital course: ?Patient status post C3-4, C4-5, C5-6, C6-7 anterior cervical discectomy with interbody fusion by Dr. Annette Stable on 06/29/2021. Increased difficulty swallowing with lung atelectasis and elevated temperatures on 07/02/2021. Made NPO following MBS by SLP. 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 CTA head and neck negative for stroke. MRI revealed epidural hematoma. Patient underwent exploration of his cervical fusion with evacuation of the epidural hematoma on 07/03/2021. Cortrak was placed on 07/04/2021. Patient's strength much improved since epidural hematoma evacuation. Cortrak removed 07/06/2021 and dysphagia 2 diet with nectar thick liquids started. Patient with delirium vs ETOH withdrawal. Discontinued steroids 07/06/2021. CIWA protocol started and discontinued 07/07/2021. CT and MRI 07/07/2021 negative. Patient developed respiratory distress, requiring intubation on 07/15/2021. Work up revealed aspiration PNA and PE. Patient was extubated on 07/22/2021. Foley catheter removed 07/24/2021. Patient transferred to 3W on 07/26/2021. Foley replaced 07/28/2021. PEG placed 07/30/2021. Foley removed 08/09/2021. PEG pulled out by patient 08/16/2021. Delirium worsening 08/17/2021. Haldol administered. IR replaced PEG on 08/17/2021. Patient with urosepsis 08/18/2021. Culture grew E. Coli. C/o LBP 08/28/2021. Lumbar MRI demonstrated chronic degenerative changes with spinal stenosis at L3-4, L4-5, and L5-S1. Patient suffered a fall overnight 08/29/2021 without injury. Worsening muscle spasms 09/11/2021, baclofen started. Patient transitioned to regular diet 09/12/2021. Episode of emesis overnight 4/19-4/20. No further episodes of emesis since 4/20. KUB 4/20 just demonstrated a nonobstructive bowel gas pattern. Fall without injury reported 09/16/2021. ? ?Subjective: ?Patient reports no issues overnight. ? ?Objective: ?Vital signs in  last 24 hours: ?Temp:  [97.6 ?F (36.4 ?C)-98.7 ?F (37.1 ?C)] 98.2 ?F (36.8 ?C) (04/25 [id number]) ?Pulse Rate:  [85-100] 100 (04/25 0849) ?Resp:  [15-16] 16 (04/25 0849) ?BP: (96-111)/(71-79) 96/77 (04/25 [id number]) ?SpO2:  [100 %] 100 % (04/25 0849) ?Weight:  [53.1 kg] 53.1 kg (04/25 0444) ? ?Intake/Output from previous day: ?04/24 0701 - 04/25 0700 ?In: 1146.4 [P.O.:210; NG/GT:936.4] ?Out: 2025 [Urine:2025] ?Intake/Output this shift: ?No intake/output data recorded. ? ?Alert; oriented to person, place, and time ?MAE, Generalized weakness ?Decreased fine motor BUE ?Incision is well-healed ? ?Lab Results: ?Recent Labs  ?  09/17/21 ?[id number] 09/18/21 ?0228  ?WBC 6.4 6.7  ?HGB 9.7* 9.8*  ?HCT 30.5* 28.7*  ?PLT 386 348  ? ?BMET ?Recent Labs  ?  09/17/21 ?[id number] 09/18/21 ?0228  ?NA 134* 133*  ?K 4.2 4.3  ?CL 100 100  ?CO2 27 25  ?GLUCOSE 118* 97  ?BUN 26* 33*  ?CREATININE 0.88 0.93  ?CALCIUM 10.2 10.3  ? ? ?Studies/Results: ?No results found. ? ?Assessment/Plan: ?Patient is doing well after a very complicated hospital course following four level ACDF. He would like to discharge home. Continue efforts at mobilization. ? ? ? LOS: 81 days  ? ? ? ?Viona Gilmore, DNP, AGNP-C ?Nurse Practitioner ? ?[facility] ?[address], [place], [place] [address] ?[id number]    F: [phone] ? ?09/18/2021, 11:57 AM ? ? ? ? ?

## 2021-09-18 NOTE — Progress Notes (Signed)
?PROGRESS NOTE ? ? ? ?Brian [name]  [id number] DOB: Sep 18, 1960 DOA: 06/29/2021 ?PCP: Pcp, No  ? ?Brief Narrative:  ?ARAF KLAHR is a 61 year old male with past medical history significant for tobacco and EtOH use disorder who was admitted by neurosurgery for cervical myelopathy due to critical multilevel cervical spinal canal stenosis C3-6 with spinal cord compression and spinal cord signal change after recent fall with progressive upper and lower extremity weakness and tremors.  He was admitted on 2/3 and underwent ACDF of C3-4, C4-5, C5-5, and C6-7.  Post-operatively he was progressing slowly with residual weakness in both hands but improving lower extremity strength and function.  He developed some difficulty swallowing on 2/5 and SLP ordered and placed on thickened liquids.   Overnight 2/6, patient with increased difficulty swallowing and was made NPO but also noted to have dysarthria and difficulty moving extremities with numbness.  SLP evaluated and found to be moderate aspiration risk.   Also progressively tachycardic, tachypneic, and now febrile.  Code stroke activated and taken for CT/ CTA head and neck and was given decadron 10mg  once.  NIHSS 18.  CTH was negative for acute findings, and CTA head neck did not reveal any LVO but noted significant prevertebral soft tissue swelling with foci of gas present at the ventral epidural space at C4-5, small collection not excluded. On 2/7, patient with worsening confusion and concern for airway involvement, PCCM consulted and remained in the intensive care unit until he is transferred to the floor on 3/2 and PCCM requested transition to [facility] for medical assistance while patient remains under the neurosurgery service. ? ?Significant Hospital Events: ?2/3 ACDF C3-4, C4-5, C5-5, and C6-7 w/ Dr. Annette Stable ?2/6 SLP eval for difficulty swallowing ?2/7 Code stroke overnight, neg for LVO, CT showing soft tissue swelling.  PCCM consulted for concern of airway management  and AMS.  Went for evacuation of hematoma  ?2/18 CT Abd/Pelvis: showing bilateral segmental Pes, started on heparin and showing worsening pneumonia, febrile to  103.  PCT rising, 34.6, restarted on abx ?2/19 possible aspiration w/ severe hypoxia; intubated; Switching from heparin to angiomax given subtherapeutic levels despite increase in rate. ?2/20 Echo shows normal LV systolic function. There was notation of a + McConnell's sign c/w a large pulmonary embolus which is consistent with the finding of bilateral segmental pulmonary emboli noted on CT 07/14/2021. ?2/21 rash appreciated on back; stopped cefepim/vanc switched to zosyn; required low dose levo with increase in fentanyl ?2/22 remains intubated; unable to extubate to do increase RR and thick secretions ?2/23 Ketamine infusion added ?2/24: weaning fentanyl.  ?2/26 extubated ?2/28 tachycardia persists ?3/2: Modified barium swallow, continues n.p.o. with core track in place, likely will need PEG tube. ?3/6: s/p IR G-tube placement ?3/16: Foley catheter discontinued ?3/24: E. coli UTI/bacteremia>> cefazolin x7 days per ID ?4/5-4/6 Had a fall overnight and fell out of the bed and was unwitnessed but patient states he rolled out of the bed and did not get hurt. Has fall precautions now. ?4/28fell while trying to reach for his eyeglasses, reports landed on left knee, reports it is a soft fall, denies left knee pain, denies hit his head ?4/14 urine is cloudy, repeat urine culture + Citrobacter Freundii that is Sensitive to Bactrim,  ?4/16 reinserted foley due to recurrent urinary retention  ?medically clear awaiting SNF placement; difficult to place per TOC. ?4/19-4/20 Vomited overnight; is recently started on regular diet with thin liquids and has continuous tube feeds were transitioned to nightly only.  Vomited about 350 cc of tube feed material early this morning.  As needed Zofran given and his TF were stopped.  ?4/21 No Nausea overnight and KUB yesterday and  showed "Nonobstructive bowel gas pattern. Moderate amount of oral contrast material throughout the colon." He was started back on IVF and will now stop after 12 hours given improvement in Nausea and Vomiting ?4/22 - He is feeling well and tolerating diet. Having some hand cramping. Continuing Rehabilitative efforts. ?4/23 - Exercising in the bed and wanting to sit in the chair. Continues to have some Hand Cramping. Had an unwitnessed fall without loss of consciousness or hitting head.  ?  ?Assessment and Plan: ?* Cervical myelopathy (HCC); severe cervical spinal stenosis with cord compression s/p ACDF [id number] on 2/3, complicated by epidural hematoma s/p evacuation 2/7 ?-Patient initially presented s/p recent fall with progressive upper and lower extremity weakness and tremors, He was found to have critical multilevel cervical spinal stenosis @ C3-6 with spinal cord compression and spinal cord signal change and underwent ACDF C3-4, C4-5, C5-5, and C6/7 by neurosurgery Dr. Trenton Gammon on 06/29/2021.  Postoperative complicated by epidural hematoma s/p evacuation on 2/7. ?-Neurosurgery feels he is doing well after a complicated Hospital course following Four Level ACDF ?-Continue PT/OT/SLP efforts ?-Awaiting SNF placement per TOC ?-Further as per neurosurgery recommendation. ? ?Acute respiratory failure with hypoxia ([place]) ?-Likely Multifactorial with significant dysphagia and recurrent aspiration pneumonia events during initial hospitalization following ACDF surgery with postoperative complications of postoperative cervical hematoma.   ?-Patient did require ventilatory support in the intensive care unit and was successfully extubated on 07/22/2021.  Patient completed extensive course of empiric antibiotics.   ?-SpO2: 100 % ?O2 Flow Rate (L/min): 0 L/min ?FiO2 (%): 40 %; Oxygen now weaned off, on room air. ?-Continue Aspiration Precautions ? ?Acute pulmonary embolism ([place]) ?-CTA chest showed bilateral segmental PE.  ?-LE [place] negative  for DVT.   ?-TTE with normal LV systolic function.  Supplemental oxygen weaned off. ?-C/w Eliquis 5 mg BID ? ?Acute metabolic encephalopathy ?-CT head, MRI brain, EEG, TSH, B12, ammonia unrevealing.  Etiology likely multifactorial with acute respiratory failure secondary to aspiration pneumonia, bilateral pulmonary embolism.  -Completed course of antibiotics for aspiration pneumonia.  Seroquel and clonazepam discontinued.  Completed course of antibiotics for E. coli UTI/septicemia. ?-C/w Supportive care ? ?E. coli bacteremia ?-Urine culture and blood cultures x2 08/17/2028 positive for E. coli.  ?-Evaluated by Infectious Disease.   ?-Completed 7-day course of antibiotics per ID. ? ?Severe sepsis due to recurrent aspiration pneumonia ?-Completed extensive course of antibiotics while under ICU care. S/p IR gtube 3/6. ?-Continue tube feeds nocturnally but may need to hold given that he had Vomiting overnight   ?-Cleared for regular diet with thin liquids per SLP; but with very little oral intake per RN; and patient with little desire for oral intake but he ate well last night ?-Continue aspiration precautions ?-Continue SLP efforts while inpatient ? ?Sinus tachycardia ?-Multifactorial including sepsis, dehydration, PE, agitation.   ?-Continue Metoprolol 12.5 mg twice daily ?-HR improved   ? ?Elevated liver enzymes ?-Etiology likely secondary to sepsis from aspiration pneumonia as above.  Acute hepatitis panel negative.  RUQ ultrasound unremarkable. ?-Continue monitor LFTs intermittently as last AST was normal at 28 and ALT was 19 on last check  ? ?Hyponatremia ?-Etiology likely secondary to dehydration.   ?-Urine sodium low.  Continue tube feeds; now cleared by speech therapy for dysphagia 3 diet. ?-Na+ went from 135 -> 133 -> 138 ->  134 -> 133 on last check  ?-Continue to monitor BMP intermittently ? ?Tobacco use disorder ?-Counseled on need for complete cessation.  -Treated with Nicotine Patch which has now been  weaned off. ? ?Physical deconditioning ?-Significant weakness in all extremities as a result of cervical myelopathy and acute illness.  Slowly improving while inpatient. ?-Continue PT/OT efforts ?-Pending S

## 2021-09-19 DIAGNOSIS — G9341 Metabolic encephalopathy: Secondary | ICD-10-CM | POA: Diagnosis not present

## 2021-09-19 DIAGNOSIS — I2601 Septic pulmonary embolism with acute cor pulmonale: Secondary | ICD-10-CM | POA: Diagnosis not present

## 2021-09-19 DIAGNOSIS — G959 Disease of spinal cord, unspecified: Secondary | ICD-10-CM | POA: Diagnosis not present

## 2021-09-19 DIAGNOSIS — R7989 Other specified abnormal findings of blood chemistry: Secondary | ICD-10-CM | POA: Diagnosis not present

## 2021-09-19 LAB — GLUCOSE, CAPILLARY
Glucose-Capillary: 105 mg/dL — ABNORMAL HIGH (ref 70–99)
Glucose-Capillary: 107 mg/dL — ABNORMAL HIGH (ref 70–99)
Glucose-Capillary: 122 mg/dL — ABNORMAL HIGH (ref 70–99)
Glucose-Capillary: 123 mg/dL — ABNORMAL HIGH (ref 70–99)
Glucose-Capillary: 94 mg/dL (ref 70–99)
Glucose-Capillary: 99 mg/dL (ref 70–99)

## 2021-09-19 NOTE — Plan of Care (Signed)
?  Problem: Education: ?Goal: Ability to verbalize activity precautions or restrictions will improve ?Outcome: Not Progressing ?Goal: Knowledge of the prescribed therapeutic regimen will improve ?Outcome: Not Progressing ?Goal: Understanding of discharge needs will improve ?Outcome: Not Progressing ?  ?Problem: Activity: ?Goal: Ability to avoid complications of mobility impairment will improve ?Outcome: Not Progressing ?Goal: Ability to tolerate increased activity will improve ?Outcome: Not Progressing ?Goal: Will remain free from falls ?Outcome: Not Progressing ?  ?Problem: Bowel/Gastric: ?Goal: Gastrointestinal status for postoperative course will improve ?Outcome: Not Progressing ?  ?Problem: Clinical Measurements: ?Goal: Ability to maintain clinical measurements within normal limits will improve ?Outcome: Not Progressing ?Goal: Postoperative complications will be avoided or minimized ?Outcome: Not Progressing ?Goal: Diagnostic test results will improve ?Outcome: Not Progressing ?  ?Problem: Pain Management: ?Goal: Pain level will decrease ?Outcome: Not Progressing ?  ?Problem: Skin Integrity: ?Goal: Will show signs of wound healing ?Outcome: Not Progressing ?  ?Problem: Health Behavior/Discharge Planning: ?Goal: Identification of resources available to assist in meeting health care needs will improve ?Outcome: Not Progressing ?  ?Problem: Bladder/Genitourinary: ?Goal: Urinary functional status for postoperative course will improve ?Outcome: Not Progressing ?  ?Problem: Safety: ?Goal: Ability to remain free from injury will improve ?Outcome: Not Progressing ?  ?Problem: Education: ?Goal: Knowledge of General Education information will improve ?Description: Including pain rating scale, medication(s)/side effects and non-pharmacologic comfort measures ?Outcome: Not Progressing ?  ?Problem: Health Behavior/Discharge Planning: ?Goal: Ability to manage health-related needs will improve ?Outcome: Not Progressing ?   ?Problem: Clinical Measurements: ?Goal: Ability to maintain clinical measurements within normal limits will improve ?Outcome: Not Progressing ?Goal: Will remain free from infection ?Outcome: Not Progressing ?Goal: Diagnostic test results will improve ?Outcome: Not Progressing ?Goal: Respiratory complications will improve ?Outcome: Not Progressing ?Goal: Cardiovascular complication will be avoided ?Outcome: Not Progressing ?  ?Problem: Activity: ?Goal: Risk for activity intolerance will decrease ?Outcome: Not Progressing ?  ?Problem: Nutrition: ?Goal: Adequate nutrition will be maintained ?Outcome: Not Progressing ?  ?Problem: Coping: ?Goal: Level of anxiety will decrease ?Outcome: Not Progressing ?  ?Problem: Elimination: ?Goal: Will not experience complications related to bowel motility ?Outcome: Not Progressing ?Goal: Will not experience complications related to urinary retention ?Outcome: Not Progressing ?  ?Problem: Pain Managment: ?Goal: General experience of comfort will improve ?Outcome: Not Progressing ?  ?Problem: Safety: ?Goal: Ability to remain free from injury will improve ?Outcome: Not Progressing ?  ?Problem: Skin Integrity: ?Goal: Risk for impaired skin integrity will decrease ?Outcome: Not Progressing ?  ?

## 2021-09-19 NOTE — Progress Notes (Signed)
? ?  Providing Compassionate, Quality Care - Together ? ?Brief hospital course: ?Patient status post C3-4, C4-5, C5-6, C6-7 anterior cervical discectomy with interbody fusion by Dr. Annette Stable on 06/29/2021. Increased difficulty swallowing with lung atelectasis and elevated temperatures on 07/02/2021. Made NPO following MBS by SLP. 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 CTA head and neck negative for stroke. MRI revealed epidural hematoma. Patient underwent exploration of his cervical fusion with evacuation of the epidural hematoma on 07/03/2021. Cortrak was placed on 07/04/2021. Patient's strength much improved since epidural hematoma evacuation. Cortrak removed 07/06/2021 and dysphagia 2 diet with nectar thick liquids started. Patient with delirium vs ETOH withdrawal. Discontinued steroids 07/06/2021. CIWA protocol started and discontinued 07/07/2021. CT and MRI 07/07/2021 negative. Patient developed respiratory distress, requiring intubation on 07/15/2021. Work up revealed aspiration PNA and PE. Patient was extubated on 07/22/2021. Foley catheter removed 07/24/2021. Patient transferred to 3W on 07/26/2021. Foley replaced 07/28/2021. PEG placed 07/30/2021. Foley removed 08/09/2021. PEG pulled out by patient 08/16/2021. Delirium worsening 08/17/2021. Haldol administered. IR replaced PEG on 08/17/2021. Patient with urosepsis 08/18/2021. Culture grew E. Coli. C/o LBP 08/28/2021. Lumbar MRI demonstrated chronic degenerative changes with spinal stenosis at L3-4, L4-5, and L5-S1. Patient suffered a fall overnight 08/29/2021 without injury. Worsening muscle spasms 09/11/2021, baclofen started. Patient transitioned to regular diet 09/12/2021. Episode of emesis overnight 4/19-4/20. No further episodes of emesis since 4/20. KUB 4/20 just demonstrated a nonobstructive bowel gas pattern. Fall without injury reported 09/16/2021. ? ?Subjective: ?Patient reports no new issues. PT noted improvements in patient's  mobility during session yesterday. Patient does have questions about documentation for his work regarding his hospitalization. ? ?Objective: ?Vital signs in last 24 hours: ?Temp:  [97.6 ?F (36.4 ?C)-98.7 ?F (37.1 ?C)] 97.7 ?F (36.5 ?C) (04/26 1117) ?Pulse Rate:  [83-101] 99 (04/26 1117) ?Resp:  [16-20] 20 (04/26 1117) ?BP: (104-111)/(70-80) 104/78 (04/26 1117) ?SpO2:  [98 %-100 %] 100 % (04/26 1117) ?Weight:  [54.2 kg] 54.2 kg (04/26 0401) ? ?Intake/Output from previous day: ?04/25 0701 - 04/26 0700 ?In: 3143.8 [NG/GT:3143.8] ?Out: 850 [Urine:850] ?Intake/Output this shift: ?Total I/O ?In: 120 [P.O.:120] ?Out: 200 [Urine:200] ? ?Alert; oriented to person, place, and time ?MAE, Generalized weakness ?Decreased fine motor BUE ?Incision is well-healed ?  ? ?Lab Results: ?Recent Labs  ?  09/17/21 ?[id number] 09/18/21 ?0228  ?WBC 6.4 6.7  ?HGB 9.7* 9.8*  ?HCT 30.5* 28.7*  ?PLT 386 348  ? ?BMET ?Recent Labs  ?  09/17/21 ?[id number] 09/18/21 ?0228  ?NA 134* 133*  ?K 4.2 4.3  ?CL 100 100  ?CO2 27 25  ?GLUCOSE 118* 97  ?BUN 26* 33*  ?CREATININE 0.88 0.93  ?CALCIUM 10.2 10.3  ? ? ?Studies/Results: ?No results found. ? ?Assessment/Plan: ?Patient is doing well after a very complicated hospital course following four level ACDF. He would like to discharge home. Continue efforts at mobilization. Patient advised to ask HR if they have a form he can fill out to get the needed documentation forwarded to them. ? ? ? LOS: 82 days  ? ? ? ?Viona Gilmore, DNP, AGNP-C ?Nurse Practitioner ? ?[facility] ?[address], [place],  [address] ?[id number]    [id number] ? ?09/19/2021, 2:03 PM ? ? ? ? ?

## 2021-09-19 NOTE — Progress Notes (Signed)
Physical Therapy Treatment ?Patient Details ?Name: Brian [name] ?MRN: [id number] ?DOB: 09-09-1960 ?Today's Date: 09/19/2021 ? ? ?History of Present Illness Pt is a 61 y/o male admitted 06/29/21 following C3-7 ACDF after progressive cervical myelopathy symptoms including ataxia, bil UE and LE shaking and weakness, and loss of function in his hands with subsequent fall. Pt initially had improvement in neurological symptoms, but postoperative course was complicated by dysphasia and ataxia secondary to and epidural hematoma with evacuation of hematoma on 2/7. Course complicated by delirium secondary to encephalopathy, sepsis secondary to aspiration PNA, reintubated 2/19-2/26 for airway protection, bilat segmental PEs 2/18. PEG placed 3/6. PMHx includes tobacco and EtOH use disorder. ? ?  ?PT Comments  ? ? Continued progression of functional mobility, transfer ability, and w/c training. The patient shows continued improvement being supervision level for all bed mobility and min guard for sit<>stand and step pivot transfer to w/c with RW. He was able to propel the w/c 200 feet (100 fwd/100bkwd) with cuing for directional control. He uses he LE mainly for propulsion and UE intermittently to help steer. He continues to be limited by LE strength, UE + LE coordination, and functional balance. Pt would benefit from continued PT services to further train transfers, standing balance, gait, and w/c training as tolerated. Plan and d/c recommendations remain unchanged. PT to follow up acutely as able.  ?    ?Recommendations for follow up therapy are one component of a multi-disciplinary discharge planning process, led by the attending physician.  Recommendations may be updated based on patient status, additional functional criteria and insurance authorization. ? ?Follow Up Recommendations ? Acute inpatient rehab (3hours/day) ?  ?  ?Assistance Recommended at Discharge Frequent or constant Supervision/Assistance  ?Patient can return  home with the following Two people to help with walking and/or transfers;A lot of help with bathing/dressing/bathroom;Assistance with cooking/housework;Assistance with feeding;Direct supervision/assist for medications management;Direct supervision/assist for financial management;Assist for transportation;Help with stairs or ramp for entrance ?  ?Equipment Recommendations ? Rolling walker (2 wheels);Wheelchair (measurements PT);Wheelchair cushion (measurements PT);Other (comment)  ?  ?   ?Precautions / Restrictions Precautions ?Precautions: Fall ?Precaution Booklet Issued: No  ?  ? ?Mobility ? Bed Mobility ?Overal bed mobility: Needs Assistance ?Bed Mobility: Supine to Sit ?  ?Sidelying to sit: Supervision ?  ?  ?  ?General bed mobility comments: Able to sit EOB w/o assist with flat HOB ?  ? ?Transfers ?Overall transfer level: Needs assistance ?Equipment used: None, Rolling walker (2 wheels) ?Transfers: Sit to/from Stand ?Sit to Stand: Min guard ?Stand pivot transfers: Min guard, Min assist ?  ?  ?  ?  ?General transfer comment: Able to come to stand EOB min guard for donning shorts. Step pivots to/from w/c min guard with RW. Still limited coordination in UE and LE. ?  ? ? ?  ? ? ?Wheelchair Mobility ?Wheelchair Mobility ?Wheelchair mobility: Yes ?Wheelchair propulsion: Both upper extremities, Both lower extermities ?Wheelchair parts: Supervision/cueing ?Distance: 200 feet ?Wheelchair Assistance Details (indicate cue type and reason): Uses LE mainly to propel, pulls 122ft then pushes 134ft. Cuing for directions and will use his hands intermitently to provide directional support. ? ?Modified Rankin (Stroke Patients Only) ?Modified Rankin (Stroke Patients Only) ?Pre-Morbid Rankin Score: No symptoms ?Modified Rankin: Moderately severe disability ? ? ?  ?Balance Overall balance assessment: Needs assistance ?Sitting-balance support: No upper extremity supported, Feet supported ?Sitting balance-Leahy Scale: Fair ?Sitting  balance - Comments: able to maintain sitting balance on EOB ?  ?  ?Standing balance-Leahy  Scale: Poor ?Standing balance comment: Able to stand with RW min guard. Requires UE support during all dynamic movements. ?  ?  ?  ?  ?  ?  ?  ?  ?  ?  ?  ?  ? ?  ?Cognition Arousal/Alertness: Awake/alert ?Behavior During Therapy: [facility] for tasks assessed/performed ?Overall Cognitive Status: Within Functional Limits for tasks assessed ?  ?  ?  ?  ?  ?  ?  ?  ?  ?  ?  ?  ?  ?  ?  ?  ?  ?  ?  ? ?  ?   ?   ? ?Pertinent Vitals/Pain Pain Assessment ?Pain Assessment: Faces ?Faces Pain Scale: Hurts a little bit ?Pain Location: UE/LE cramps ?Pain Descriptors / Indicators: Spasm ?Pain Intervention(s): Limited activity within patient's tolerance, Monitored during session  ? ? ? ?PT Goals (current goals can now be found in the care plan section) Acute Rehab PT Goals ?Patient Stated Goal: become mod I with wheelchair ?PT Goal Formulation: With patient ?Time For Goal Achievement: 10/01/21 ?Potential to Achieve Goals: Good ?Progress towards PT goals: Progressing toward goals ? ?  ?Frequency ? ? ? Min 5X/week ? ? ? ?  ?PT Plan Current plan remains appropriate  ? ? ?   ?AM-PAC PT "6 Clicks" Mobility   ?Outcome Measure ? Help needed turning from your back to your side while in a flat bed without using bedrails?: A Little ?Help needed moving from lying on your back to sitting on the side of a flat bed without using bedrails?: A Little ?Help needed moving to and from a bed to a chair (including a wheelchair)?: A Lot ?Help needed standing up from a chair using your arms (e.g., wheelchair or bedside chair)?: A Little ?Help needed to walk in hospital room?: A Lot ?Help needed climbing 3-5 steps with a railing? : A Lot ?6 Click Score: 15 ? ?  ?End of Session Equipment Utilized During Treatment: Gait belt ?Activity Tolerance: Patient tolerated treatment well ?Patient left: in chair;with call bell/phone within reach;with chair alarm set ?Nurse  Communication: Mobility status ?PT Visit Diagnosis: Muscle weakness (generalized) (M62.81);Other symptoms and signs involving the nervous system (R29.898);Difficulty in walking, not elsewhere classified (R26.2);Other abnormalities of gait and mobility (R26.89) ?  ? ? ?Time: [id number] ?PT Time Calculation (min) (ACUTE ONLY): 43 min ? ?Charges:  $Therapeutic Activity: 23-37 mins ?$Wheel Chair Management: 8-22 mins          ?          ? ?Thermon Leyland, SPT ?Acute Rehab Services ? ? ? ?Thermon Leyland ?09/19/2021, 3:45 PM ? ?

## 2021-09-19 NOTE — Progress Notes (Signed)
?PROGRESS NOTE/consult ? ? ? ?Brian [name]  [id number] DOB: 1960-08-27 DOA: 06/29/2021 ?PCP: Pcp, No  ? ? ?No chief complaint on file. ? ? 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  He was admitted on 2/3 and underwent ACDF of C3-4, C4-5, C5-5, and C6-7.  Post-operatively he was progressing slowly with residual weakness in both hands but improving lower extremity strength and function.  He developed some difficulty swallowing on 2/5 and SLP ordered and placed on thickened liquids.   Overnight 2/6, patient with increased difficulty swallowing and was made NPO but also noted to have dysarthria and difficulty moving extremities with numbness.  SLP evaluated and found to be moderate aspiration risk.   Also progressively tachycardic, tachypneic, and now febrile.  Code stroke activated and taken for CT/ CTA head and neck and was given decadron 10mg  once.  NIHSS 18.  CTH was negative for acute findings, and CTA head neck did not reveal any LVO but noted significant prevertebral soft tissue swelling with foci of gas present at the ventral epidural space at C4-5, small collection not excluded. On 2/7, patient with worsening confusion and concern for airway involvement, PCCM consulted and remained in the intensive care unit until he is transferred to the floor on 3/2 and PCCM requested transition to [facility] for medical assistance while patient remains under the neurosurgery service. ? ?Significant Hospital Events: ?2/3 ACDF C3-4, C4-5, C5-5, and C6-7 w/ Dr. Annette Stable ?2/6 SLP eval for difficulty swallowing ?2/7 Code stroke overnight, neg for LVO, CT showing soft tissue swelling.  PCCM  consulted for concern of airway management and AMS.  Went for evacuation of hematoma  ?2/18 CT Abd/Pelvis: showing bilateral segmental Pes, started on heparin and showing worsening pneumonia, febrile to  103.  PCT rising, 34.6, restarted on abx ?2/19 possible aspiration w/ severe hypoxia; intubated; Switching from heparin to angiomax given subtherapeutic levels despite increase in rate. ?2/20 Echo shows normal LV systolic function. There was notation of a + McConnell's sign c/w a large pulmonary embolus which is consistent with the finding of bilateral segmental pulmonary emboli noted on CT 07/14/2021. ?2/21 rash appreciated on back; stopped cefepim/vanc switched to zosyn; required low dose levo with increase in fentanyl ?2/22 remains intubated; unable to extubate to do increase RR and thick secretions ?2/23 Ketamine infusion added ?2/24: weaning fentanyl.  ?2/26 extubated ?2/28 tachycardia persists ?3/2: Modified barium swallow, continues n.p.o. with core track in place, likely will need PEG tube. ?3/6: s/p IR G-tube placement ?3/16: Foley catheter discontinued ?3/24: E. coli UTI/bacteremia>> cefazolin x7 days per ID ?4/5-4/6 Had a fall overnight and fell out of the bed and was unwitnessed but patient states he rolled out of the bed and did not get hurt. Has fall precautions now. ?4/92fell while trying to reach for his eyeglasses, reports landed on left knee, reports it is a soft fall, denies left knee pain, denies hit his head ?4/14 urine is cloudy, repeat urine culture + Citrobacter Freundii that is Sensitive to Bactrim,  ?4/16 reinserted foley due to recurrent urinary retention  ?medically clear awaiting SNF placement; difficult to place per TOC. ?4/19-4/20 Vomited overnight; is recently started on regular diet with thin liquids and has  continuous tube feeds were transitioned to nightly only.  Vomited about 350 cc of tube feed material early this morning.  As needed Zofran given and his TF were stopped.  ?4/21 No  Nausea overnight and KUB yesterday and showed "Nonobstructive bowel gas pattern. Moderate amount of oral contrast material throughout the colon." He was started back on IVF and will now stop after 12 hours given improvement in Nausea and Vomiting ?4/22 - He is feeling well and tolerating diet. Having some hand cramping. Continuing Rehabilitative efforts. ?4/23 - Exercising in the bed and wanting to sit in the chair. Continues to have some Hand Cramping. Had an unwitnessed fall without loss of consciousness or hitting head.  ?  ? ? ?Assessment & Plan: ? Principal Problem: ?  Cervical myelopathy (HCC); severe cervical spinal stenosis with cord compression s/p ACDF C3-7 on 2/3, complicated by epidural hematoma s/p evacuation 2/7 ?Active Problems: ?  Acute respiratory failure with hypoxia (HCC) ?  Acute pulmonary embolism (HCC) ?  Acute metabolic encephalopathy ?  Severe sepsis due to recurrent aspiration pneumonia ?  E. coli bacteremia ?  Sinus tachycardia ?  Elevated liver enzymes ?  Hyponatremia ?  Tobacco use disorder ?  Physical deconditioning ?  Urinary retention ?  Protein-calorie malnutrition, severe (HCC) ?  Lumbar back pain ?  Fall during current hospitalization ?  Normocytic anemia ?  Thrombocytosis ?  Citrobacter infection ?  Vomiting ? ? ? ?Assessment and Plan: ?* Cervical myelopathy (HCC); severe cervical spinal stenosis with cord compression s/p ACDF C3-7 on 2/3, complicated by epidural hematoma s/p evacuation 2/7 ?-Patient initially presented s/p recent fall with progressive upper and lower extremity weakness and tremors, He was found to have critical multilevel cervical spinal stenosis @ C3-6 with spinal cord compression and spinal cord signal change and underwent ACDF C3-4, C4-5, C5-5, and C6/7 by neurosurgery Dr. Dutch Quint on 06/29/2021.  Postoperative complicated by epidural hematoma s/p evacuation on 2/7. ?-Neurosurgery feels he is doing well after a complicated Hospital course following Four Level  ACDF ?-Continue PT/OT/SLP efforts ?-Awaiting SNF placement per TOC ?-Further as per neurosurgery recommendation. ? ?Acute respiratory failure with hypoxia (HCC) ?-Likely Multifactorial with significant dysphagia and recurrent aspiration pneumonia events during initial hospitalization following ACDF surgery with postoperative complications of postoperative cervical hematoma.   ?-Patient did require ventilatory support in the intensive care unit and was successfully extubated on 07/22/2021.  Patient completed extensive course of empiric antibiotics.   ?-SpO2: 100 % ?O2 Flow Rate (L/min): 0 L/min ?FiO2 (%): 40 %; Oxygen now weaned off, on room air with sats of 99 to 100%. ?-Continue Aspiration Precautions ? ?Acute pulmonary embolism (HCC) ?-CTA chest showed bilateral segmental PE.  ?-LE [place] negative for DVT.   ?-TTE with normal LV systolic function.  Supplemental oxygen weaned off. ?-Continue Eliquis 5 mg twice daily.   ? ?Acute metabolic encephalopathy ?-CT head, MRI brain, EEG, TSH, B12, ammonia unrevealing.  Etiology likely multifactorial with acute respiratory failure secondary to aspiration pneumonia, bilateral pulmonary embolism.   ?-Completed course of antibiotics for aspiration pneumonia.  Seroquel and clonazepam discontinued.  Completed course of antibiotics for E. coli UTI/septicemia. ?-C/w Supportive care ? ?E. coli bacteremia ?-Urine culture and blood cultures x2 08/17/2021 positive for E. coli.  ?-Evaluated by Infectious Disease.   ?-Completed 7-day course of antibiotics per ID. ? ?Severe sepsis due to recurrent aspiration pneumonia ?-Completed extensive course of antibiotics while under ICU care. S/p IR gtube 3/6. ?-Continue tube feeds nocturnally but may need to  hold given that he had Vomiting overnight   ?-Cleared for regular diet with thin liquids per SLP; but with very little oral intake per RN; and patient with little desire for oral intake but he ate well last night ?-Continue aspiration  precautions ?-Continue SLP efforts while inpatient ? ?Sinus tachycardia ?-Multifactorial including sepsis, dehydration, PE, agitation.   ?-Continue Metoprolol 12.5 mg twice daily ?-HR improved   ? ?Elevated liver enzym

## 2021-09-20 ENCOUNTER — Inpatient Hospital Stay (HOSPITAL_COMMUNITY): Payer: 59

## 2021-09-20 DIAGNOSIS — R7989 Other specified abnormal findings of blood chemistry: Secondary | ICD-10-CM | POA: Diagnosis not present

## 2021-09-20 DIAGNOSIS — J690 Pneumonitis due to inhalation of food and vomit: Secondary | ICD-10-CM

## 2021-09-20 DIAGNOSIS — G9341 Metabolic encephalopathy: Secondary | ICD-10-CM | POA: Diagnosis not present

## 2021-09-20 DIAGNOSIS — I2601 Septic pulmonary embolism with acute cor pulmonale: Secondary | ICD-10-CM | POA: Diagnosis not present

## 2021-09-20 DIAGNOSIS — G959 Disease of spinal cord, unspecified: Secondary | ICD-10-CM | POA: Diagnosis not present

## 2021-09-20 LAB — GLUCOSE, CAPILLARY
Glucose-Capillary: 108 mg/dL — ABNORMAL HIGH (ref 70–99)
Glucose-Capillary: 113 mg/dL — ABNORMAL HIGH (ref 70–99)
Glucose-Capillary: 116 mg/dL — ABNORMAL HIGH (ref 70–99)
Glucose-Capillary: 93 mg/dL (ref 70–99)
Glucose-Capillary: 97 mg/dL (ref 70–99)

## 2021-09-20 LAB — CBC
HCT: 27.7 % — ABNORMAL LOW (ref 39.0–52.0)
Hemoglobin: 8.6 g/dL — ABNORMAL LOW (ref 13.0–17.0)
MCH: 29.8 pg (ref 26.0–34.0)
MCHC: 31 g/dL (ref 30.0–36.0)
MCV: 95.8 fL (ref 80.0–100.0)
Platelets: 291 10*3/uL (ref 150–400)
RBC: 2.89 MIL/uL — ABNORMAL LOW (ref 4.22–5.81)
RDW: 17 % — ABNORMAL HIGH (ref 11.5–15.5)
WBC: 6.3 10*3/uL (ref 4.0–10.5)
nRBC: 0 % (ref 0.0–0.2)

## 2021-09-20 LAB — RENAL FUNCTION PANEL
Albumin: 3.2 g/dL — ABNORMAL LOW (ref 3.5–5.0)
Anion gap: 7 (ref 5–15)
BUN: 31 mg/dL — ABNORMAL HIGH (ref 6–20)
CO2: 26 mmol/L (ref 22–32)
Calcium: 9.9 mg/dL (ref 8.9–10.3)
Chloride: 102 mmol/L (ref 98–111)
Creatinine, Ser: 0.77 mg/dL (ref 0.61–1.24)
GFR, Estimated: >60 mL/min (ref >60)
Glucose, Bld: 119 mg/dL — ABNORMAL HIGH (ref 70–99)
Phosphorus: 4.2 mg/dL (ref 2.5–4.6)
Potassium: 3.9 mmol/L (ref 3.5–5.1)
Sodium: 135 mmol/L (ref 135–145)

## 2021-09-20 LAB — MAGNESIUM: Magnesium: 2 mg/dL (ref 1.7–2.4)

## 2021-09-20 IMAGING — DX DG ABD PORTABLE 2V
1 series · 2 of 2 positions shown · non-contrast
Comparison: Prior abdominal radiographs [DATE]

CLINICAL DATA: Emesis

EXAM:
PORTABLE ABDOMEN - 2 VIEW

[Series 1: abdomen · 0.14mm/px · 2 of 2 slices shown]
[im 1/2]
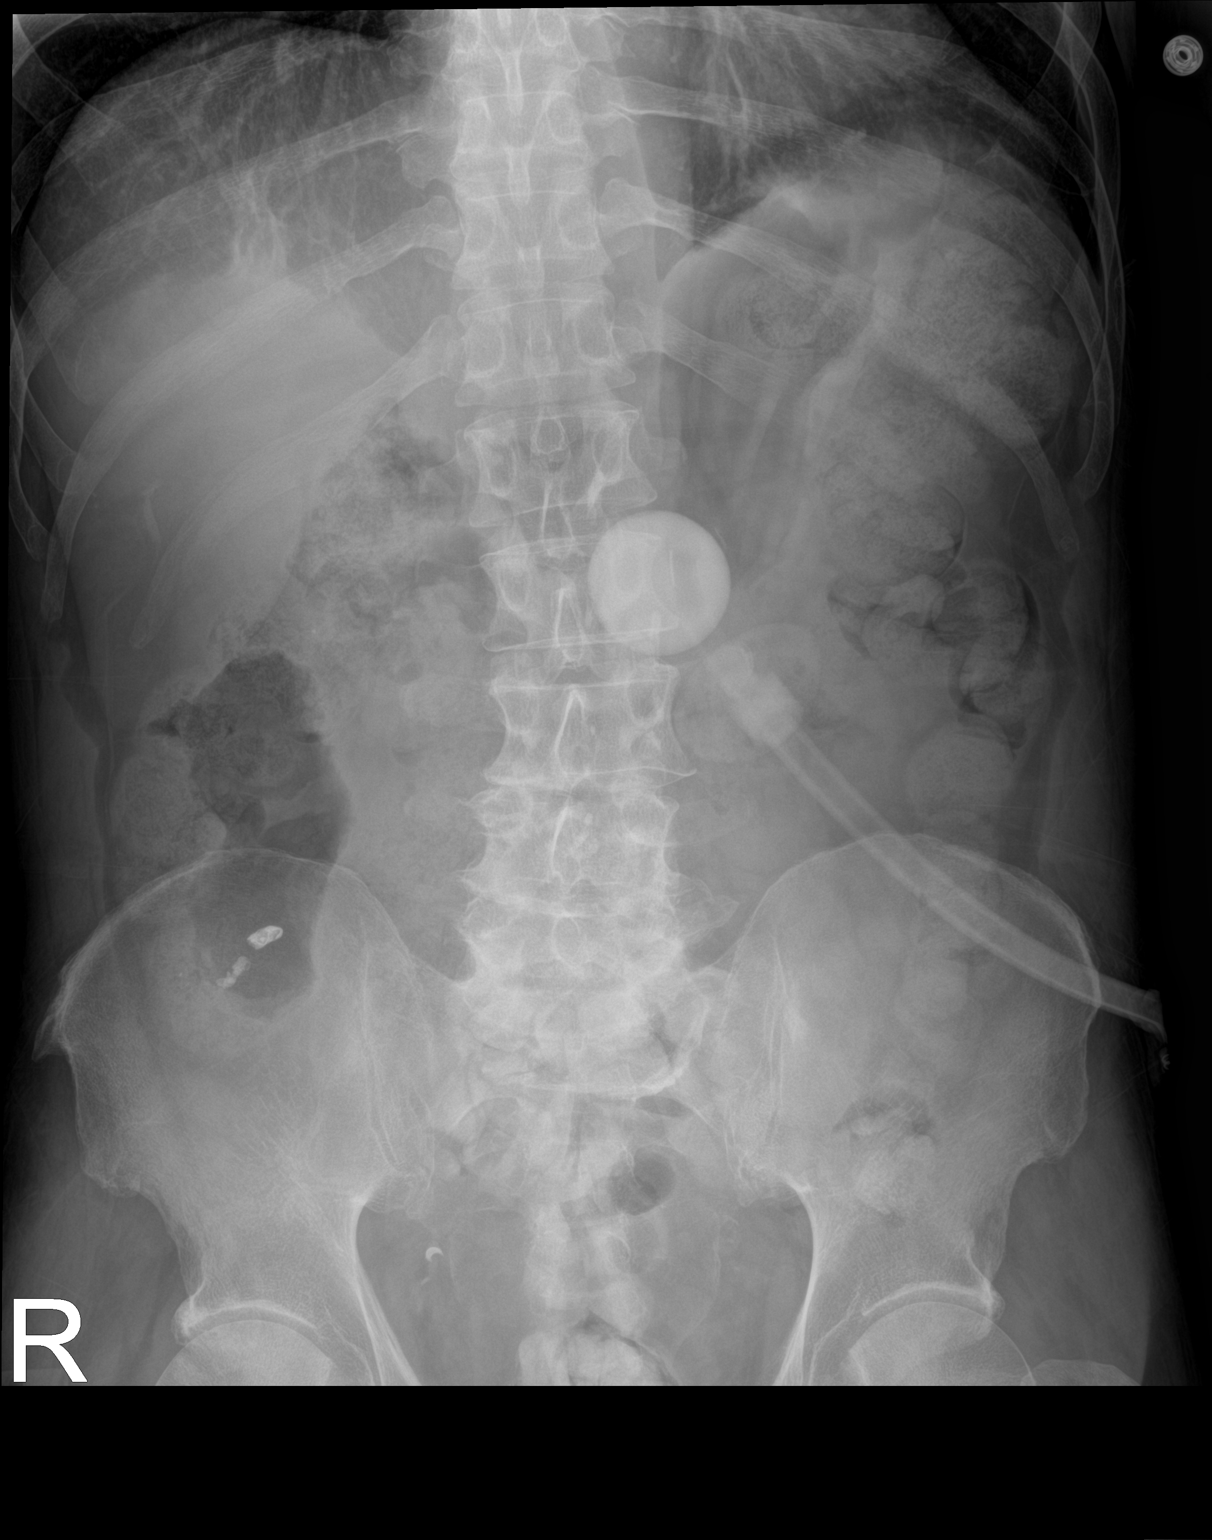
[im 2/2]
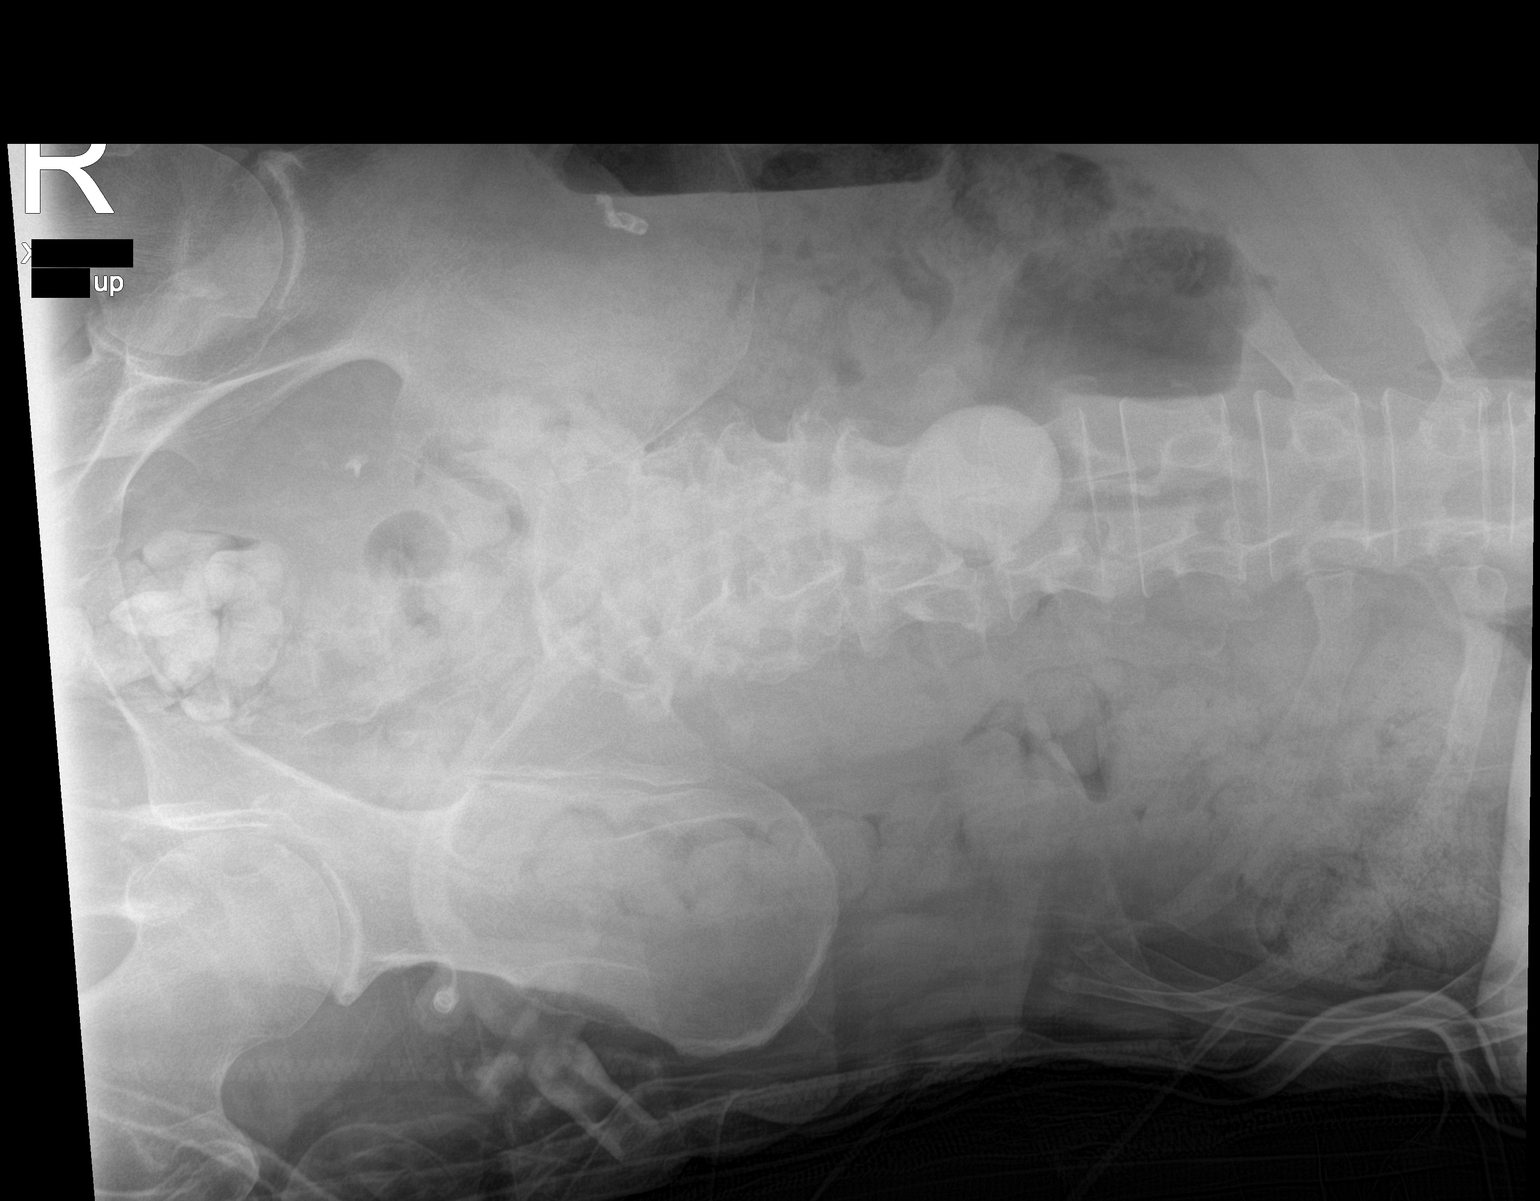

[2 of 2 positions shown; findings below may reference images not displayed]

FINDINGS: Gastrostomy tube projects over the stomach. Previously ingested oral
contrast material has passed. The appendix remains visible overlying
the right lower quadrant. Moderate to high volume of formed stool
throughout the rectum and colon. No evidence of gastric distension.
No evidence of free air.
IMPRESSION: 1. No evidence of gastric distension, bowel obstruction or free air.
2. Well-positioned percutaneous gastrostomy tube.
3. Moderate to high volume of formed stool throughout the rectum and
colon.

## 2021-09-20 MED ORDER — OSMOLITE 1.5 CAL PO LIQD
1360.0000 mL | ORAL | Status: DC
  Administered 2021-09-21: 1360 mL
  Filled 2021-09-20: qty 2000

## 2021-09-20 MED ORDER — POLYETHYLENE GLYCOL 3350 17 G PO PACK
17.0000 g | PACK | Freq: Every day | ORAL | Status: DC
  Administered 2021-09-20 – 2021-09-22 (×3): 17 g
  Filled 2021-09-20 (×2): qty 1

## 2021-09-20 MED ORDER — SENNOSIDES 8.8 MG/5ML PO SYRP
5.0000 mL | ORAL_SOLUTION | Freq: Two times a day (BID) | ORAL | Status: DC
  Administered 2021-09-20 – 2021-09-22 (×5): 5 mL
  Filled 2021-09-20 (×7): qty 5

## 2021-09-20 MED ORDER — PROCHLORPERAZINE EDISYLATE 10 MG/2ML IJ SOLN
10.0000 mg | Freq: Once | INTRAMUSCULAR | Status: AC
  Administered 2021-09-20: 10 mg via INTRAVENOUS
  Filled 2021-09-20: qty 2

## 2021-09-20 MED ORDER — DOCUSATE SODIUM 50 MG/5ML PO LIQD: 50.0000 mg | Freq: Two times a day (BID) | ORAL | Status: DC | PRN

## 2021-09-20 MED ORDER — SORBITOL 70 % SOLN
30.0000 mL | Freq: Once | Status: AC
  Administered 2021-09-20: 30 mL
  Filled 2021-09-20: qty 30

## 2021-09-20 NOTE — Progress Notes (Addendum)
?PROGRESS NOTE/consult ? ? ? ?Brian [name]  [id number] DOB: 1960/07/26 DOA: 06/29/2021 ?PCP: Pcp, No  ? ? ?No chief complaint on file. ? ? 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  He was admitted on 2/3 and underwent ACDF of C3-4, C4-5, C5-5, and C6-7.  Post-operatively he was progressing slowly with residual weakness in both hands but improving lower extremity strength and function.  He developed some difficulty swallowing on 2/5 and SLP ordered and placed on thickened liquids.   Overnight 2/6, patient with increased difficulty swallowing and was made NPO but also noted to have dysarthria and difficulty moving extremities with numbness.  SLP evaluated and found to be moderate aspiration risk.   Also progressively tachycardic, tachypneic, and now febrile.  Code stroke activated and taken for CT/ CTA head and neck and was given decadron 10mg  once.  NIHSS 18.  CTH was negative for acute findings, and CTA head neck did not reveal any LVO but noted significant prevertebral soft tissue swelling with foci of gas present at the ventral epidural space at C4-5, small collection not excluded. On 2/7, patient with worsening confusion and concern for airway involvement, PCCM consulted and remained in the intensive care unit until he is transferred to the floor on 3/2 and PCCM requested transition to [facility] for medical assistance while patient remains under the neurosurgery service. ? ?Significant Hospital Events: ?2/3 ACDF C3-4, C4-5, C5-5, and C6-7 w/ Dr. Annette Stable ?2/6 SLP eval for difficulty swallowing ?2/7 Code stroke overnight, neg for LVO, CT showing soft tissue swelling.  PCCM  consulted for concern of airway management and AMS.  Went for evacuation of hematoma  ?2/18 CT Abd/Pelvis: showing bilateral segmental Pes, started on heparin and showing worsening pneumonia, febrile to  103.  PCT rising, 34.6, restarted on abx ?2/19 possible aspiration w/ severe hypoxia; intubated; Switching from heparin to angiomax given subtherapeutic levels despite increase in rate. ?2/20 Echo shows normal LV systolic function. There was notation of a + McConnell's sign c/w a large pulmonary embolus which is consistent with the finding of bilateral segmental pulmonary emboli noted on CT 07/14/2021. ?2/21 rash appreciated on back; stopped cefepim/vanc switched to zosyn; required low dose levo with increase in fentanyl ?2/22 remains intubated; unable to extubate to do increase RR and thick secretions ?2/23 Ketamine infusion added ?2/24: weaning fentanyl.  ?2/26 extubated ?2/28 tachycardia persists ?3/2: Modified barium swallow, continues n.p.o. with core track in place, likely will need PEG tube. ?3/6: s/p IR G-tube placement ?3/16: Foley catheter discontinued ?3/24: E. coli UTI/bacteremia>> cefazolin x7 days per ID ?4/5-4/6 Had a fall overnight and fell out of the bed and was unwitnessed but patient states he rolled out of the bed and did not get hurt. Has fall precautions now. ?4/62fell while trying to reach for his eyeglasses, reports landed on left knee, reports it is a soft fall, denies left knee pain, denies hit his head ?4/14 urine is cloudy, repeat urine culture + Citrobacter Freundii that is Sensitive to Bactrim,  ?4/16 reinserted foley due to recurrent urinary retention  ?medically clear awaiting SNF placement; difficult to place per TOC. ?4/19-4/20 Vomited overnight; is recently started on regular diet with thin liquids and has  continuous tube feeds were transitioned to nightly only.  Vomited about 350 cc of tube feed material early this morning.  As needed Zofran given and his TF were stopped.  ?4/21 No  Nausea overnight and KUB yesterday and showed "Nonobstructive bowel gas pattern. Moderate amount of oral contrast material throughout the colon." He was started back on IVF and will now stop after 12 hours given improvement in Nausea and Vomiting ?4/22 - He is feeling well and tolerating diet. Having some hand cramping. Continuing Rehabilitative efforts. ?4/23 - Exercising in the bed and wanting to sit in the chair. Continues to have some Hand Cramping. Had an unwitnessed fall without loss of consciousness or hitting head.  ?  ? ? ?Assessment & Plan: ? Principal Problem: ?  Cervical myelopathy ([place]); severe cervical spinal stenosis with cord compression s/p ACDF [id number] on 2/3, complicated by epidural hematoma s/p evacuation 2/7 ?Active Problems: ?  Acute respiratory failure with hypoxia ([place]) ?  Acute pulmonary embolism ([place]) ?  Acute metabolic encephalopathy ?  Severe sepsis due to recurrent aspiration pneumonia ?  E. coli bacteremia ?  Sinus tachycardia ?  Elevated liver enzymes ?  Hyponatremia ?  Tobacco use disorder ?  Physical deconditioning ?  Urinary retention ?  Protein-calorie malnutrition, severe ([place]) ?  Lumbar back pain ?  Fall during current hospitalization ?  Normocytic anemia ?  Thrombocytosis ?  Citrobacter infection ?  Vomiting ?  Abnormal LFTs ?  Aspiration pneumonia ([place]) ? ? ? ?Assessment and Plan: ?* Cervical myelopathy (H[facility]); severe cervical spinal stenosis with cord compression s/p ACDF [id number] on 2/3, complicated by epidural hematoma s/p evacuation 2/7 ?-Patient initially presented s/p recent fall with progressive upper and lower extremity weakness and tremors, He was found to have critical multilevel cervical spinal stenosis @ C3-6 with spinal cord compression and spinal cord signal change and underwent ACDF C3-4, C4-5, C5-5, and C6/7 by neurosurgery Dr. Trenton Gammon on 06/29/2021.  Postoperative complicated by epidural hematoma s/p evacuation on 2/7. ?-Neurosurgery feels he is doing well after a  complicated Hospital course following Four Level ACDF ?-Continue PT/OT/SLP efforts ?-Awaiting SNF placement per TOC ?-Further as per neurosurgery recommendation. ? ?Acute respiratory failure with hypoxia ([place]) ?-Likely Multifactorial with significant dysphagia and recurrent aspiration pneumonia events during initial hospitalization following ACDF surgery with postoperative complications of postoperative cervical hematoma.   ?-Patient did require ventilatory support in the intensive care unit and was successfully extubated on 07/22/2021.  Patient completed extensive course of empiric antibiotics.   ?-SpO2: 100 % ?O2 Flow Rate (L/min): 0 L/min ?FiO2 (%): 40 %; Oxygen now weaned off, on room air with sats of 100%. ?-Continue Aspiration Precautions ? ?Acute pulmonary embolism ([place]) ?-CTA chest showed bilateral segmental PE.  ?-LE [place] negative for DVT.   ?-TTE with normal LV systolic function.  Supplemental oxygen weaned off. ?-Continue Eliquis 5 mg twice daily.   ? ?Acute metabolic encephalopathy ?-CT head, MRI brain, EEG, TSH, B12, ammonia unrevealing.  Etiology likely multifactorial with acute respiratory failure secondary to aspiration pneumonia, bilateral pulmonary embolism.   ?-Completed course of antibiotics for aspiration pneumonia.  Seroquel and clonazepam discontinued.  Completed course of antibiotics for E. coli UTI/septicemia. ?-C/w Supportive care ? ?E. coli bacteremia ?-Urine culture and blood cultures x2 08/17/2021 positive for E. coli.  ?-Evaluated by Infectious Disease.   ?-Completed 7-day course of antibiotics per ID. ? ?Severe sepsis due to recurrent aspiration pneumonia ?-Completed extensive course of antibiotics while under ICU care. S/p IR gtube 3/6. ?-Continue  tube feeds nocturnally but will hold given that he had Vomiting again today. ?-Cleared for regular diet with thin liquids per SLP; but with very little oral intake per RN; and patient with little desire for oral intake. ?-Continue aspiration  precautions ?-Continue SLP efforts while inpatient ? ?Sinus tachycardia ?-Multifactorial including sepsis, dehydration, PE, agitation.   ?-Continue Metoprolol 12.5 mg twice daily ?-HR improved   ? ?Elevated li

## 2021-09-20 NOTE — Progress Notes (Signed)
PT Cancellation Note ? ?Patient Details ?Name: KALEM ROCKWELL ?MRN: [id number] ?DOB: [birth year] ? ? ?Cancelled Treatment:    Reason Eval/Treat Not Completed: Medical issues which prohibited therapy- nursing states that patient had emesis x2 today and has an x-ray ordered. PT to follow up acutely as able.  ? ?Lorie Apley, SPT ?Acute Rehab Services ? ?Lorie Apley ?09/20/2021, 1:18 PM ?

## 2021-09-20 NOTE — Progress Notes (Signed)
? ?  Providing Compassionate, Quality Care - Together ? ?Brief hospital course: ?Patient status post C3-4, C4-5, C5-6, C6-7 anterior cervical discectomy with interbody fusion by Dr. Annette Stable on 06/29/2021. Increased difficulty swallowing with lung atelectasis and elevated temperatures on 07/02/2021. Made NPO following MBS by SLP. 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 CTA head and neck negative for stroke. MRI revealed epidural hematoma. Patient underwent exploration of his cervical fusion with evacuation of the epidural hematoma on 07/03/2021. Cortrak was placed on 07/04/2021. Patient's strength much improved since epidural hematoma evacuation. Cortrak removed 07/06/2021 and dysphagia 2 diet with nectar thick liquids started. Patient with delirium vs ETOH withdrawal. Discontinued steroids 07/06/2021. CIWA protocol started and discontinued 07/07/2021. CT and MRI 07/07/2021 negative. Patient developed respiratory distress, requiring intubation on 07/15/2021. Work up revealed aspiration PNA and PE. Patient was extubated on 07/22/2021. Foley catheter removed 07/24/2021. Patient transferred to 3W on 07/26/2021. Foley replaced 07/28/2021. PEG placed 07/30/2021. Foley removed 08/09/2021. PEG pulled out by patient 08/16/2021. Delirium worsening 08/17/2021. Haldol administered. IR replaced PEG on 08/17/2021. Patient with urosepsis 08/18/2021. Culture grew E. Coli. C/o LBP 08/28/2021. Lumbar MRI demonstrated chronic degenerative changes with spinal stenosis at L3-4, L4-5, and L5-S1. Patient suffered a fall overnight 08/29/2021 without injury. Worsening muscle spasms 09/11/2021, baclofen started. Patient transitioned to regular diet 09/12/2021. Episode of emesis overnight 4/19-4/20. No further episodes of emesis since 4/20. KUB 4/20 just demonstrated a nonobstructive bowel gas pattern. Fall without injury reported 09/16/2021. Foley catheter removed again. Another fall 09/19/2021. No injury per report, but per  day nurse c/o left lower back pain. Episode of emesis in the morning on 09/20/2021. ? ?Subjective: ?Patient very somnolent today. Restless in the bed; appears uncomfortable. ? ?Objective: ?Vital signs in last 24 hours: ?Temp:  [97.3 ?F (36.3 ?C)-98.9 ?F (37.2 ?C)] 97.4 ?F (36.3 ?C) (04/27 1131) ?Pulse Rate:  [91-118] 99 (04/27 1131) ?Resp:  [16-20] 18 (04/27 1131) ?BP: (100-140)/(75-95) 120/81 (04/27 1131) ?SpO2:  [99 %-100 %] 100 % (04/27 1131) ?Weight:  [55.6 kg] 55.6 kg (04/27 0416) ? ?Intake/Output from previous day: ?04/26 0701 - 04/27 0700 ?In: 120 [P.O.:120] ?Out: 200 [Urine:200] ?Intake/Output this shift: ?No intake/output data recorded. ? ?Somnolent; oriented to person, delayed responses ?MAE, Generalized weakness ?Decreased fine motor BUE ?Incision is well-healed ? ?Lab Results: ?Recent Labs  ?  09/18/21 ?0228  ?WBC 6.7  ?HGB 9.8*  ?HCT 28.7*  ?PLT 348  ? ?BMET ?Recent Labs  ?  09/18/21 ?0228 09/20/21 ?0228  ?NA 133* 135  ?K 4.3 3.9  ?CL 100 102  ?CO2 25 26  ?GLUCOSE 97 119*  ?BUN 33* 31*  ?CREATININE 0.93 0.77  ?CALCIUM 10.3 9.9  ? ? ?Studies/Results: ?No results found. ? ?Assessment/Plan: ?Patient had been doing well after a very complicated hospital course following four level ACDF. He appears more somnolent today. Dr.  Grandville Silos has ordered an abdominal x-ray and enema for the patient. Patient remains afebrile and appears to be voiding without significant PVR. Monitor patient for urinary retention as he's previously presented with AMS with UTI. Mobilize patient as tolerated. ? ? ? LOS: 83 days  ? ? ? ?Viona Gilmore, DNP, AGNP-C ?Nurse Practitioner ? ?[facility] ?[address], [address], [place], [place] [address] ?PRN:[id number]    FTJ:[id number] ? ?09/20/2021, 12:05 PM ? ? ? ? ?

## 2021-09-20 NOTE — Progress Notes (Signed)
Nutrition Follow-up ? ?DOCUMENTATION CODES:  ?Severe malnutrition in context of social or environmental circumstances, Underweight ? ?INTERVENTION:  ?Continue current diet as ordered, encourage PO intake ?Continue nocturnal TF as ordered:  ?Osmolite 1.5 @ 52mL/h x 16 hours (1700-0900) ?2 packets of prosource TF daily (40kcal and 11g of protein) ?This regimen provides 1342mL of formula, 2120kcal, 107g of protein, and 1272mL free water (TF+flush) ? ?NUTRITION DIAGNOSIS:  ?Severe Malnutrition related to social / environmental circumstances as evidenced by severe muscle depletion, severe fat depletion.  ?- ongoing  ? ?GOAL:  ?Patient will meet greater than or equal to 90% of their needs  ?- addressing via TF ? ?MONITOR:  ?TF tolerance ? ?REASON FOR ASSESSMENT:  ?Consult ?Assessment of nutrition requirement/status, Enteral/tube feeding initiation and management ? ?ASSESSMENT:  ?Pt with hx EtOH abuse, tobacco use, and spinal stenosis initially presented 2/3 for planned multilevel anterior cervical decompression and fusion surgery after experiencing progressive bilateral upper and lower extremity weakness and spasticity due to critical multilevel cervical spinal stenosis with spinal cord compression and signal change. ? ?2/3 - s/p anterior cervical discectomy with interbody fusion  ?2/5 - pt initially complained of worsening swallowing function ?2/6 - MBS, NPO per SLP ?2/7 - transferred to ICU, s/p re-exploration anterior cervical fusion with evacuation of epidural hematoma ?2/8 - Cortrak tube placed (tip gastric), tube feeds initiated ?2/10 - MBS, diet advanced to dysphagia 2 with nectar-thick liquids, Cortrak removed ?2/19- Intubated after desaturation, possibly from PO intake ?2/20 - s/p cortrak tube; post pyloric  ?2/26 - Extubated ?3/02 - failed MBS ?3/6 - PEG placed ?3/23 - diet advanced to dysphagia 2 with nectar thick liquids s/p MBSS ?3/24 - PEG replaced ? ?Pt out of room at the time of visit. RN reports that pt  is downstairs for CT after having a change of status noted by PT.  ? ?Discussed nutrition plan, will continue nocturnal feeds as ordered as pt is still eating minimally PO. Typically eats 1x/d at dinner. RN noted poor hygiene and dry mouth this AM, encouraged pt to drink water.  ? ?Weight appears stable at this time. ? ?Current TF: Osmolite 1.5 @ 26ml/hr x 16 hours  w/ 48ml Prosource TF BID and 82ml free water Q4H.  ? ?Nutritionally Relevant Medications: ?Scheduled Meds: ? baclofen  20 mg Oral TID  ? feeding supplement (OSMOLITE 1.5 CAL)  1,360 mL Per Tube Q24H  ? feeding supplement (PROSource TF)  45 mL Per Tube BID  ? folic acid  1 mg Per Tube Daily  ? multivitamin with minerals  1 tablet Per Tube Daily  ? polyethylene glycol  17 g Per Tube Daily  ? sennosides  5 mL Per Tube BID  ? thiamine  100 mg Per Tube Daily  ? ?PRN Meds: bisacodyl, sennosides, docusate, ondansetron, sodium phosphate ? ?Labs Reviewed ? ?Diet Order:   ?Diet Order   ? ?       ?  Diet regular Room service appropriate? Yes; Fluid consistency: Thin  Diet effective now       ?  ? ?  ?  ? ?  ? ?EDUCATION NEEDS:  ?Education needs have been addressed ? ?Skin:  Skin Assessment: Skin Integrity Issues: ?Skin Integrity Issues:: Other (Comment) ?Incisions: closed neck ?Other: MASD buttocks ? ?Last BM:  4/26 ? ?Height:  ?Ht Readings from Last 1 Encounters:  ?07/15/21 5\' 10"  (1.778 m)  ? ?Weight:  ?Wt Readings from Last 1 Encounters:  ?09/21/21 54.5 kg  ? ?Ideal Body Weight:  78.2 kg ? ?BMI:  Body mass index is 17.24 kg/m?. ? ?Estimated Nutritional Needs:  ?Kcal:  2100-2300 ?Protein:  105-125 grams ?Fluid:  >2L/day ? ? ?Ranell Patrick, RD, LDN ?Clinical Dietitian ?RD pager # available in [place]  ?After hours/weekend pager # available in [place] [employer] [place] ?

## 2021-09-20 NOTE — Progress Notes (Signed)
Patient c/o of sudden sharp pain in the left lower back.  Increase with palpation.  He was treated with Diluadid and then vomited shortly afterwards. He was treated with zofran for the nausea/vomiting.  He was slow to respond after the medication and was back in the center of the bed.  Later,  his daily medications were given via peg tube and a few minutes later he vomited them all back up.   He was treated with Compazine per order.  Md was made aware of the initial new onset pain and vomiting. MD ordered laxatives and abdominal xray.  SSE was given per order once the patient was able to wake and follow directions.  No results from the SSE yet.  Patient continues to be slow to respond to questions.   ?

## 2021-09-20 NOTE — Progress Notes (Signed)
OT Cancellation Note ? ?Patient Details ?Name: Brian [name] ?MRN: [id number] ?DOB: Mar 04, 1961 ? ? ?Cancelled Treatment:    Reason Eval/Treat Not Completed: Medical issues which prohibited therapy (nursing states that patient had emesis x2 today and has an x-ray ordered.  will attempt again later if able) ?Alfonse Flavors, OTA ?Acute Rehabilitation Services  ?Pager [phone] ?Office [phone] ? ?Cynda Soule Jeannett Senior ?09/20/2021, 12:53 PM ?

## 2021-09-20 NOTE — Progress Notes (Signed)
SLP Cancellation Note ? ?Patient Details ?Name: Brian [name] ?MRN: [id number] ?DOB: 09-06-1960 ? ? ?Cancelled treatment:        Planned to see for dysphagia however, pt complained of pain, seemed somewhat dazed then vomited large amount- RN present. Will continue efforts.  ? ? ?[name] ?09/20/2021, 11:13 AM ?

## 2021-09-21 ENCOUNTER — Inpatient Hospital Stay (HOSPITAL_COMMUNITY): Payer: 59

## 2021-09-21 DIAGNOSIS — I2601 Septic pulmonary embolism with acute cor pulmonale: Secondary | ICD-10-CM | POA: Diagnosis not present

## 2021-09-21 DIAGNOSIS — G9341 Metabolic encephalopathy: Secondary | ICD-10-CM | POA: Diagnosis not present

## 2021-09-21 DIAGNOSIS — R7989 Other specified abnormal findings of blood chemistry: Secondary | ICD-10-CM | POA: Diagnosis not present

## 2021-09-21 DIAGNOSIS — G959 Disease of spinal cord, unspecified: Secondary | ICD-10-CM | POA: Diagnosis not present

## 2021-09-21 LAB — URINALYSIS, ROUTINE W REFLEX MICROSCOPIC
Bilirubin Urine: NEGATIVE
Glucose, UA: NEGATIVE mg/dL
Hgb urine dipstick: NEGATIVE
Ketones, ur: NEGATIVE mg/dL
Leukocytes,Ua: NEGATIVE
Nitrite: NEGATIVE
Protein, ur: NEGATIVE mg/dL
Specific Gravity, Urine: 1.013 (ref 1.005–1.030)
pH: 7 (ref 5.0–8.0)

## 2021-09-21 LAB — CBC WITH DIFFERENTIAL/PLATELET
Abs Immature Granulocytes: 0.01 10*3/uL (ref 0.00–0.07)
Basophils Absolute: 0 10*3/uL (ref 0.0–0.1)
Basophils Relative: 0 %
Eosinophils Absolute: 0.1 10*3/uL (ref 0.0–0.5)
Eosinophils Relative: 2 %
HCT: 29.7 % — ABNORMAL LOW (ref 39.0–52.0)
Hemoglobin: 9.7 g/dL — ABNORMAL LOW (ref 13.0–17.0)
Immature Granulocytes: 0 %
Lymphocytes Relative: 54 %
Lymphs Abs: 3.2 10*3/uL (ref 0.7–4.0)
MCH: 30.9 pg (ref 26.0–34.0)
MCHC: 32.7 g/dL (ref 30.0–36.0)
MCV: 94.6 fL (ref 80.0–100.0)
Monocytes Absolute: 0.6 10*3/uL (ref 0.1–1.0)
Monocytes Relative: 10 %
Neutro Abs: 2 10*3/uL (ref 1.7–7.7)
Neutrophils Relative %: 34 %
Platelets: 327 10*3/uL (ref 150–400)
RBC: 3.14 MIL/uL — ABNORMAL LOW (ref 4.22–5.81)
RDW: 16.9 % — ABNORMAL HIGH (ref 11.5–15.5)
WBC: 6 10*3/uL (ref 4.0–10.5)
nRBC: 0 % (ref 0.0–0.2)

## 2021-09-21 LAB — GLUCOSE, CAPILLARY
Glucose-Capillary: 114 mg/dL — ABNORMAL HIGH (ref 70–99)
Glucose-Capillary: 81 mg/dL (ref 70–99)
Glucose-Capillary: 102 mg/dL — ABNORMAL HIGH (ref 70–99)
Glucose-Capillary: 112 mg/dL — ABNORMAL HIGH (ref 70–99)
Glucose-Capillary: 123 mg/dL — ABNORMAL HIGH (ref 70–99)
Glucose-Capillary: 126 mg/dL — ABNORMAL HIGH (ref 70–99)

## 2021-09-21 LAB — MAGNESIUM: Magnesium: 2 mg/dL (ref 1.7–2.4)

## 2021-09-21 LAB — BASIC METABOLIC PANEL
Anion gap: 5 (ref 5–15)
BUN: 26 mg/dL — ABNORMAL HIGH (ref 6–20)
CO2: 27 mmol/L (ref 22–32)
Calcium: 10.3 mg/dL (ref 8.9–10.3)
Chloride: 104 mmol/L (ref 98–111)
Creatinine, Ser: 0.87 mg/dL (ref 0.61–1.24)
GFR, Estimated: >60 mL/min (ref >60)
Glucose, Bld: 100 mg/dL — ABNORMAL HIGH (ref 70–99)
Potassium: 3.9 mmol/L (ref 3.5–5.1)
Sodium: 136 mmol/L (ref 135–145)

## 2021-09-21 IMAGING — CT CT HEAD W/O CM
4 series · 15 of 47 positions shown, 17 images · non-contrast
Comparison: [DATE]

CLINICAL DATA: Mental status change, persistent or worsening



[Series 3: head wo · axial · 0.35mm/px · z∈[+1242,+1367]mm · 7 of 35 slices shown, 9 images]
[im 5/35  brain]
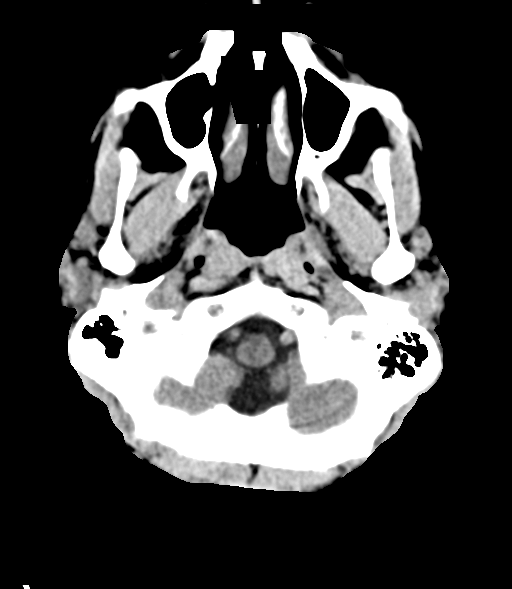
[im 5/35  bone]
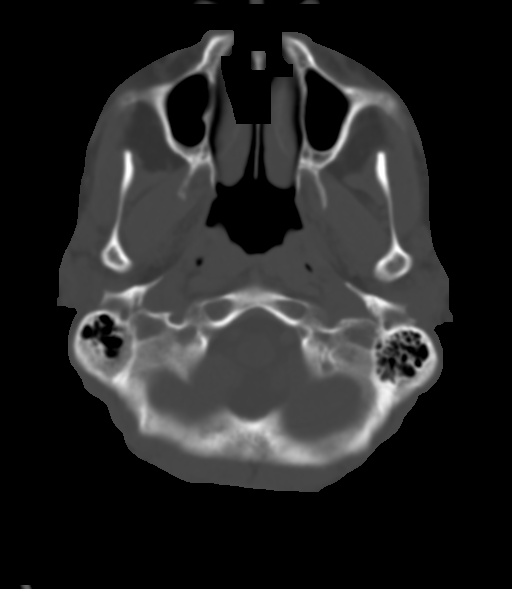
[im 9/35  brain]
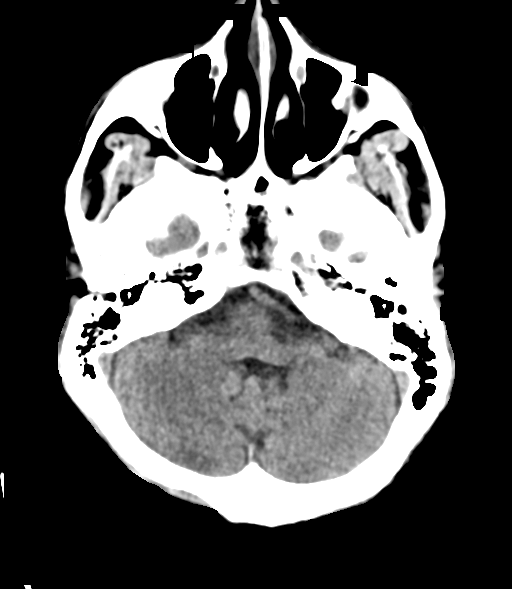
[im 13/35  brain]
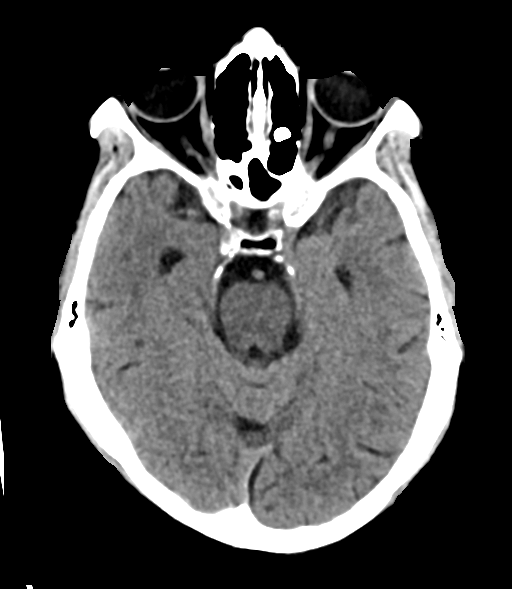
[im 18/35  brain]
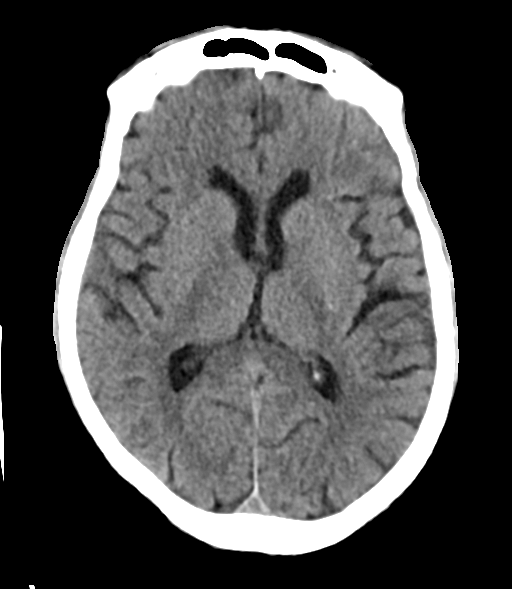
[im 22/35  brain]
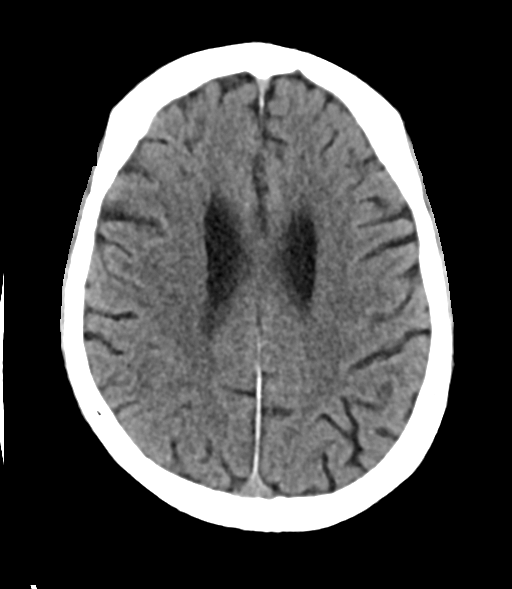
[im 22/35  bone]
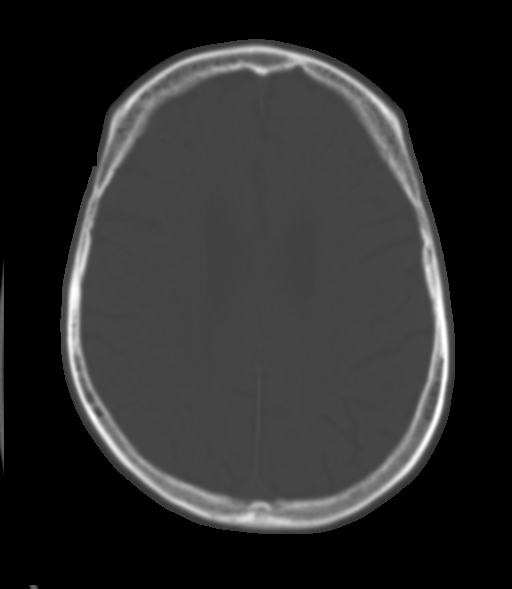
[im 26/35  brain]
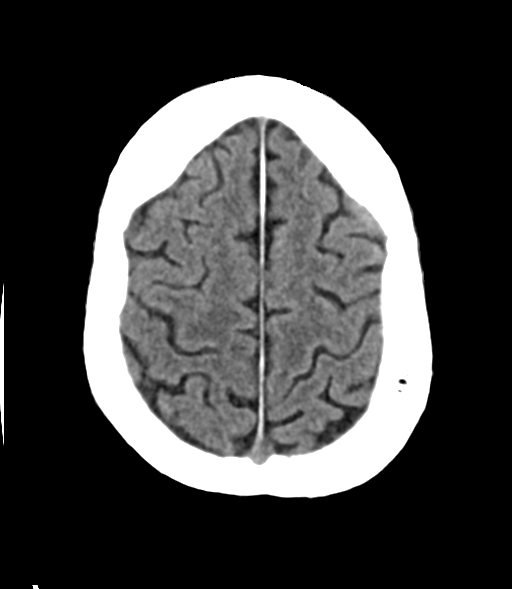
[im 30/35  brain]
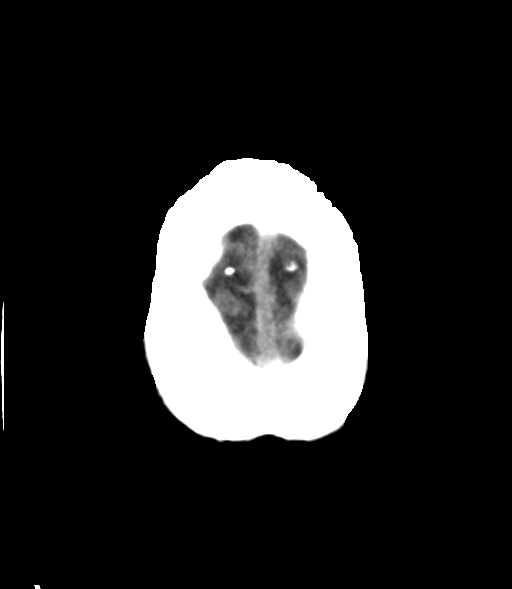

[Series 4: head bone · axial · 0.41mm/px · z∈[+1243,+1261]mm · 2 of 87 slices shown]
[im 9/87  bone]
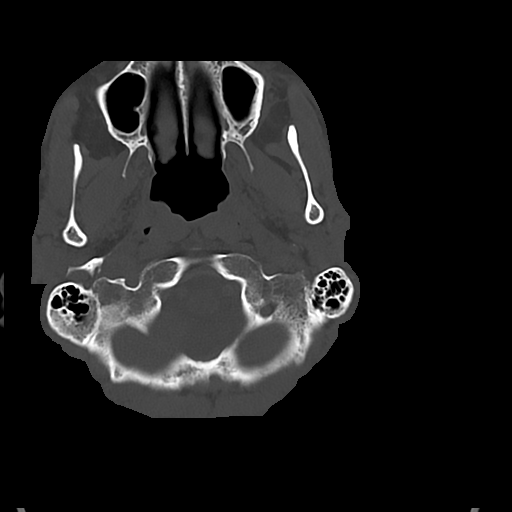
[im 18/87  bone]
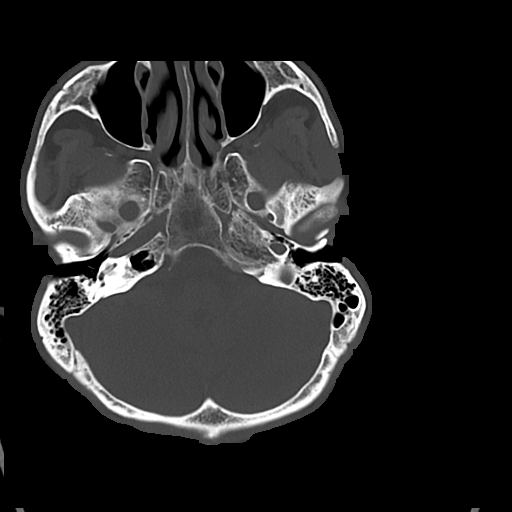

[Series 5: cor soft · coronal · 0.33mm/px · 3 of 68 slices shown]
[im 23/68  brain]
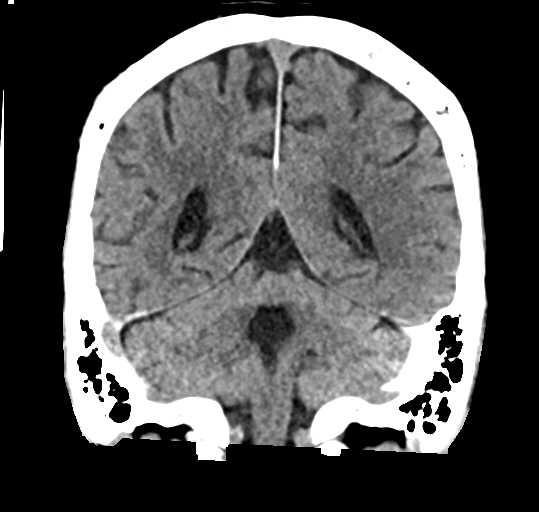
[im 30/68  brain]
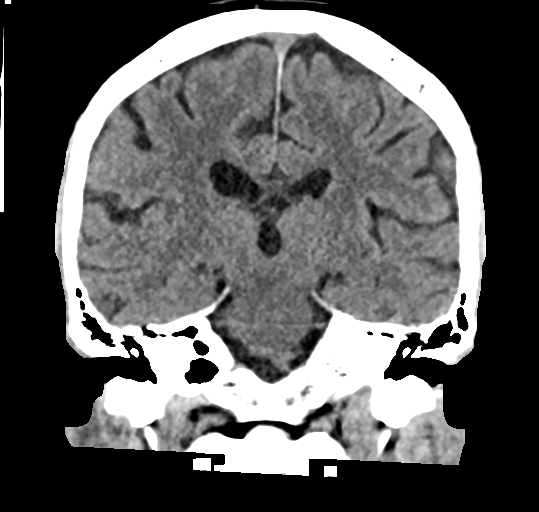
[im 38/68  brain]
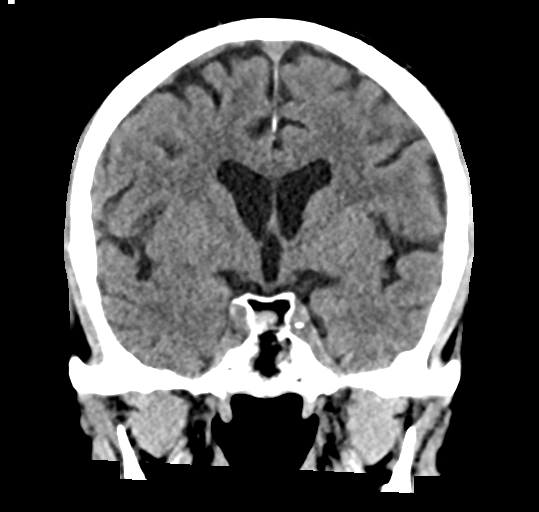

[Series 6: sag soft · sagittal · 0.33mm/px · 3 of 58 slices shown]
[im 20/58  brain]
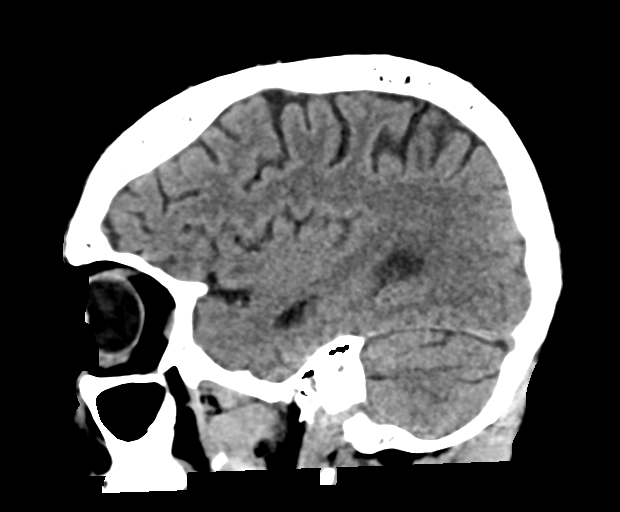
[im 29/58  brain]
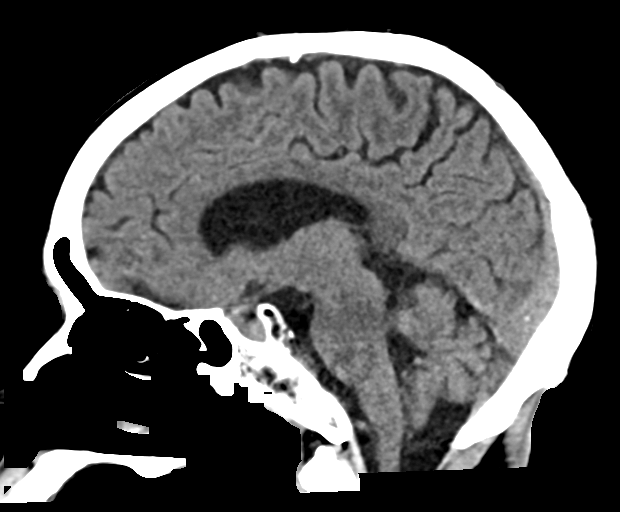
[im 39/58  brain]
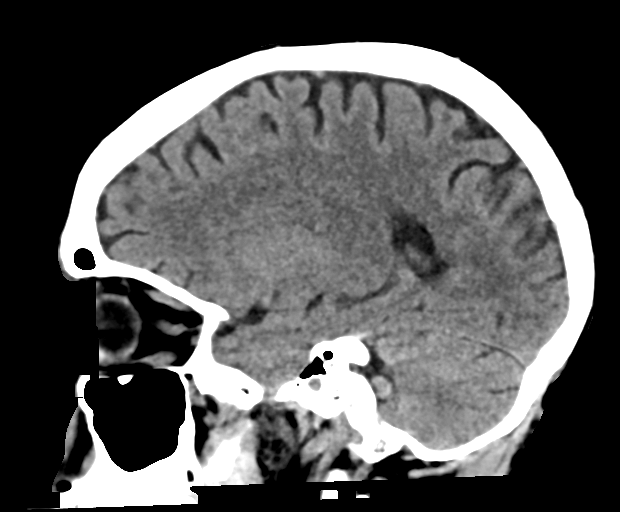

[15 of 47 positions shown; findings below may reference images not displayed]

FINDINGS: Brain: No evidence of acute infarction, hemorrhage, hydrocephalus,
extra-axial collection or mass lesion/mass effect.

Vascular: No hyperdense vessel or unexpected calcification.

Skull: Normal. Negative for fracture or focal lesion.

Sinuses/Orbits: No acute finding.

Other: None.
IMPRESSION: No acute intracranial process.

## 2021-09-21 NOTE — Progress Notes (Signed)
RN paged, MD after speaking with PT. PT stated they are familiar with patient and he has significant cognitive changes. PT stated he has a decrease in his processing to move, fatigued, and overall decrease in his mentation with delayed responses.  ? ? ?

## 2021-09-21 NOTE — Progress Notes (Signed)
Speech Language Pathology Treatment: Dysphagia  ?Patient Details ?Name: Brian [name] ?MRN: [id number] ?DOB: [birth year] ?Today's Date: 09/21/2021 ?Time: 5361-4431 ?SLP Time Calculation (min) (ACUTE ONLY): 14 min ? ?Assessment / Plan / Recommendation ?Clinical Impression ? Pt seen today for skilled SLP to assure dietary tolerance of advancement.  Upon entrance to room, pt was alert - but he appeared sleepy during session - requiring cues to participate.  SLP set up tray in front of him - after which he immediately stated to move it out of the way and appeared frustrated.  Articulation was impaired therefore speech was only comprehensible to single words. Pt advised he would eat peaches- and SLP assisted him to brush his teeth with hand over hand assist. He demonstrated excellent oral control to swish and expectorate water.   Pt consumed single peach and 3 straw boluses of water only with suspected mild delay in oral transiting with solid but no indication of retention.  He declined further intake stating he is not hungry.  Using teach back, pt did verbalize need to cough during meal after SLP explained clinical reasoning.  He then was nodding off - and thus SLP completed session.  Will follow up for RMT to improve cough strength for airway protection. ? ?  ?HPI HPI: Pt is a 61 y/o admitted 06/29/21 following C3-7 ACDF after progressive cervical myelopathy symptoms with subsequent fall and severe incomplete SCI. Significant prevertebral swelling per imaging. Pt developed some difficulty swallowing on 2/5 and SLP consulted. MBS 2/6 revealed severe edema s/p ACDF, oral holding, a pharyngeal delay, inconsistent hyolaryngeal elevation, incomplete epiglottic inversion due to edema. Aspiration noted accross consistencies and and NPO status was recommended. Repeat MBS 2/10 showed significantly reduced pharyngeal edema and a dysphagia 2 diet with nectar thick liquids was initiated. Pt transitioned to thin liquids 2/16 and  susbequently returned to nectar thick 2/17 due to concern for aspiration. Pt developed pna, possible aspiration, requiring intubation 2/19-26. Cortrak placed 2/20. PEG placed in IR on 3/6. No significant PMH. Pt is appropriate for his 4th MBS to determine if he can upgrade to thin liquids. ?  ?   ?SLP Plan ? Continue with current plan of care ? ?  ?  ?Recommendations for follow up therapy are one component of a multi-disciplinary discharge planning process, led by the attending physician.  Recommendations may be updated based on patient status, additional functional criteria and insurance authorization. ?  ? ?Recommendations  ?Diet recommendations: Regular;Thin liquid ?Liquids provided via: Cup;Straw ?Medication Administration: Whole meds with puree ?Compensations: Slow rate;Small sips/bites;Other (Comment) (hard cough intermittently during meal) ?Postural Changes and/or Swallow Maneuvers: Seated upright 90 degrees  ?   ?    ?   ? ? ? ? Oral Care Recommendations: Oral care BID ?Follow Up Recommendations: Skilled nursing-short term rehab (<3 hours/day) ?Assistance recommended at discharge: Intermittent Supervision/Assistance ?SLP Visit Diagnosis: Dysphagia, pharyngeal phase (R13.13) ?Plan: Continue with current plan of care ? ? ? ? ?  ?  ? ?Brian Infante, MS CCC SLP ?Acute Rehab Services ?Office [phone] ?Pager [phone] ? ?Brian [name] ? ?09/21/2021, 9:53 AM ?

## 2021-09-21 NOTE — Progress Notes (Signed)
Physical Therapy Treatment ?Patient Details ?Name: Brian [name] ?MRN: [id number] ?DOB: 1961/05/21 ?Today's Date: 09/21/2021 ? ? ?History of Present Illness Pt is a 61 y/o male admitted 06/29/21 following C3-7 ACDF after progressive cervical myelopathy symptoms including ataxia, bil UE and LE shaking and weakness, and loss of function in his hands with subsequent fall. Pt initially had improvement in neurological symptoms, but postoperative course was complicated by dysphasia and ataxia secondary to and epidural hematoma with evacuation of hematoma on 2/7. Course complicated by delirium secondary to encephalopathy, sepsis secondary to aspiration PNA, reintubated 2/19-2/26 for airway protection, bilat segmental PEs 2/18. PEG placed 3/6. PMHx includes tobacco and EtOH use disorder. ? ?  ?PT Comments  ? ? Continued progression of functional mobility, transfers, and gait today. Pt presents fatigued and with slowed processing and initiation today. He presents with increased time to transfer, slowed command following, less responsive, and with decreased gross motor coordination. He was able to get EOB with increased time and balance while his clothes were donned. Originally he was Min A for STS and gait, then progressing up to mod A for power up and stability. At the end of the session he was max A for squat pivot and lateral scooting in bed. Rn notified about status change and was contacting the MD. Pt. Walked 3x15 feet with RW min-mod A,then a 4th unsuccessful attempt secondary to fatigue. Pt would benefit from continued PT services to maximize independence, functional transfer ability, balance, strength, and gait as tolerated. Plan and d/c recommendations remain unchanged. PT to follow up acutely as able.  ?  ?Recommendations for follow up therapy are one component of a multi-disciplinary discharge planning process, led by the attending physician.  Recommendations may be updated based on patient status, additional  functional criteria and insurance authorization. ? ?Follow Up Recommendations ? Acute inpatient rehab (3hours/day) ?  ?  ?Assistance Recommended at Discharge Frequent or constant Supervision/Assistance  ?Patient can return home with the following Two people to help with walking and/or transfers;A lot of help with bathing/dressing/bathroom;Assistance with cooking/housework;Assistance with feeding;Direct supervision/assist for medications management;Direct supervision/assist for financial management;Assist for transportation;Help with stairs or ramp for entrance ?  ?Equipment Recommendations ? Rolling walker (2 wheels);Wheelchair (measurements PT);Wheelchair cushion (measurements PT);Other (comment)  ?  ?Recommendations for Other Services Rehab consult ? ? ?  ?Precautions / Restrictions Precautions ?Precautions: Fall ?Precaution Booklet Issued: No ?Precaution Comments: G-tube ?Restrictions ?Weight Bearing Restrictions: No  ?  ? ?Mobility ? Bed Mobility ?Overal bed mobility: Needs Assistance ?Bed Mobility: Supine to Sit, Sit to Supine ?  ?  ?Supine to sit: Min guard ?Sit to supine: Max assist ?  ?General bed mobility comments: Min guard with increased time to get EOB, requires max A for return to supine and scoot in bed. ?  ? ?Transfers ?Overall transfer level: Needs assistance ?Equipment used: None, Rolling walker (2 wheels) ?Transfers: Sit to/from Stand ?Sit to Stand: Mod assist ?  ?Step pivot transfers: Mod assist ?Squat pivot transfers: Max assist ?  ? Lateral/Scoot Transfers: Max assist ?General transfer comment: Min-mod A for all STS, power up and trunk support. Cues for hand placement and sequencing, increasing assist needed as pt fatigued. Max A for lateral scoot in bed and for squat pivot from w/c to bed at the end of the session. ?  ? ?Ambulation/Gait ?Ambulation/Gait assistance: Mod assist ?Gait Distance (Feet): 15 Feet (x 3 bouts, 4 unsuccessful attempt) ?  ?Gait Pattern/deviations: Step-through pattern,  Decreased step length - right, Decreased step  length - left, Decreased stride length, Decreased dorsiflexion - right, Decreased dorsiflexion - left, Decreased weight shift to right, Decreased weight shift to left, Knee flexed in stance - right, Knee flexed in stance - left, Trunk flexed, Narrow base of support ?Gait velocity: decreased ?  ?  ?General Gait Details: Pt. requiring increasing assist as he become fatigued. Shows reduced scissoring of feet, but quad and glute control decreased throughout the session requiring more assist to stay upright ? ? ? ? ? ?  ?Balance Overall balance assessment: Needs assistance ?Sitting-balance support: No upper extremity supported, Feet supported ?Sitting balance-Leahy Scale: Fair ?Sitting balance - Comments: Able to sit EOB during cothes donning ?Postural control: Posterior lean ?Standing balance support: Bilateral upper extremity supported ?Standing balance-Leahy Scale: Poor ?Standing balance comment: Requires RW, trunk, and glute support throughout session. As the session progressed he required more assist. ?  ?  ?  ?  ?  ?  ?  ?  ?  ?  ?  ?  ? ?  ?Cognition Arousal/Alertness: Lethargic ?Behavior During Therapy: Flat affect ?Overall Cognitive Status: Impaired/Different from baseline ?Area of Impairment: Attention, Following commands, Safety/judgement, Awareness, Problem solving ?  ?  ?  ?  ?  ?  ?  ?  ?  ?Current Attention Level: Sustained ?  ?Following Commands: Follows one step commands with increased time, Follows multi-step commands with increased time ?Safety/Judgement: Decreased awareness of safety, Decreased awareness of deficits ?Awareness: Emergent ?Problem Solving: Requires verbal cues, Requires tactile cues, Slow processing, Decreased initiation, Difficulty sequencing ?General Comments: Pt lethargic today, presents with slowed response time and processing. Requires multiple cues for all mobility both verbal and tactile. Decreased problem solving ability and mildly  slurred speech. ?  ?  ? ?  ?   ?   ? ?Pertinent Vitals/Pain Pain Assessment ?Pain Assessment: Faces ?Faces Pain Scale: Hurts a little bit ?Pain Location: General discomfort ?Pain Descriptors / Indicators: Constant, Aching ?Pain Intervention(s): Limited activity within patient's tolerance, Monitored during session  ? ? ? ?PT Goals (current goals can now be found in the care plan section) Acute Rehab PT Goals ?Patient Stated Goal: become mod I with wheelchair ?PT Goal Formulation: With patient ?Time For Goal Achievement: 10/01/21 ?Potential to Achieve Goals: Good ?Progress towards PT goals: Not progressing toward goals - comment (Limited by fatigue) ? ?  ?Frequency ? ? ? Min 5X/week ? ? ? ?  ?PT Plan Current plan remains appropriate  ? ? ?   ?AM-PAC PT "6 Clicks" Mobility   ?Outcome Measure ? Help needed turning from your back to your side while in a flat bed without using bedrails?: A Little ?Help needed moving from lying on your back to sitting on the side of a flat bed without using bedrails?: A Little ?Help needed moving to and from a bed to a chair (including a wheelchair)?: A Lot ?Help needed standing up from a chair using your arms (e.g., wheelchair or bedside chair)?: A Lot ?Help needed to walk in hospital room?: A Lot ?Help needed climbing 3-5 steps with a railing? : A Lot ?6 Click Score: 14 ? ?  ?End of Session Equipment Utilized During Treatment: Gait belt ?Activity Tolerance: Patient limited by fatigue ?Patient left: in bed;with call bell/phone within reach;with bed alarm set ?Nurse Communication: Mobility status ?PT Visit Diagnosis: Muscle weakness (generalized) (M62.81);Other symptoms and signs involving the nervous system (R29.898);Difficulty in walking, not elsewhere classified (R26.2);Other abnormalities of gait and mobility (R26.89) ?  ? ? ?Time:  [id number] ?PT Time Calculation (min) (ACUTE ONLY): 31 min ? ?Charges:  $Gait Training: 8-22 mins ?$Therapeutic Activity: 8-22 mins          ?           ? ?Thermon Leyland, SPT ?Acute Rehab Services ? ? ? ?Thermon Leyland ?09/21/2021, 1:33 PM ? ?

## 2021-09-21 NOTE — Progress Notes (Signed)
? ?Providing Compassionate, Quality Care - Together ? ? ?Brief hospital course: ?Patient status post C3-4, C4-5, C5-6, C6-7 anterior cervical discectomy with interbody fusion by Dr. Jordan Likes on 06/29/2021. Increased difficulty swallowing with lung atelectasis and elevated temperatures on 07/02/2021. Made NPO following MBS by SLP. 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 CTA head and neck negative for stroke. MRI revealed epidural hematoma. Patient underwent exploration of his cervical fusion with evacuation of the epidural hematoma on 07/03/2021. Cortrak was placed on 07/04/2021. Patient's strength much improved since epidural hematoma evacuation. Cortrak removed 07/06/2021 and dysphagia 2 diet with nectar thick liquids started. Patient with delirium vs ETOH withdrawal. Discontinued steroids 07/06/2021. CIWA protocol started and discontinued 07/07/2021. CT and MRI 07/07/2021 negative. Patient developed respiratory distress, requiring intubation on 07/15/2021. Work up revealed aspiration PNA and PE. Patient was extubated on 07/22/2021. Foley catheter removed 07/24/2021. Patient transferred to 3W on 07/26/2021. Foley replaced 07/28/2021. PEG placed 07/30/2021. Foley removed 08/09/2021. PEG pulled out by patient 08/16/2021. Delirium worsening 08/17/2021. Haldol administered. IR replaced PEG on 08/17/2021. Patient with urosepsis 08/18/2021. Culture grew E. Coli. C/o LBP 08/28/2021. Lumbar MRI demonstrated chronic degenerative changes with spinal stenosis at L3-4, L4-5, and L5-S1. Patient suffered a fall overnight 08/29/2021 without injury. Worsening muscle spasms 09/11/2021, baclofen started. Patient transitioned to regular diet 09/12/2021. Episode of emesis overnight 4/19-4/20. No further episodes of emesis since 4/20. KUB 4/20 just demonstrated a nonobstructive bowel gas pattern. Fall without injury reported 09/16/2021. Mental status decline 09/20/2021. CT head and labs negative. Mental status improving  09/21/2021. ? ?Subjective: ?Patient reports he feels better today. Nurse reports his mental status has improved a great deal since this morning. ? ?Objective: ?Vital signs in last 24 hours: ?Temp:  [97.9 ?F (36.6 ?C)-98.7 ?F (37.1 ?C)] 98.5 ?F (36.9 ?C) (04/28 1108) ?Pulse Rate:  [85-110] 110 (04/28 1108) ?Resp:  [14-20] 18 (04/28 1108) ?BP: (97-145)/(73-87) 133/84 (04/28 1108) ?SpO2:  [98 %-100 %] 100 % (04/28 0407) ?Weight:  [54.5 kg] 54.5 kg (04/28 0455) ? ?Intake/Output from previous day: ?04/27 0701 - 04/28 0700 ?In: -  ?Out: 1650 [Urine:1650] ?Intake/Output this shift: ?No intake/output data recorded. ? ?Alert; oriented to person, place, and time ?MAE, Generalized weakness ?Decreased fine motor BUE ?Incision is well-healed ? ?Lab Results: ?Recent Labs  ?  09/20/21 ?0310 09/21/21 ?0139  ?WBC 6.3 6.0  ?HGB 8.6* 9.7*  ?HCT 27.7* 29.7*  ?PLT 291 327  ? ?BMET ?Recent Labs  ?  09/20/21 ?0228 09/21/21 ?0139  ?NA 135 136  ?K 3.9 3.9  ?CL 102 104  ?CO2 26 27  ?GLUCOSE 119* 100*  ?BUN 31* 26*  ?CREATININE 0.77 0.87  ?CALCIUM 9.9 10.3  ? ? ?Studies/Results: ?CT HEAD WO CONTRAST ( ) ? ?Result Date: 09/21/2021 ?CLINICAL DATA:  Mental status change, persistent or worsening EXAM: CT HEAD WITHOUT CONTRAST TECHNIQUE: Contiguous axial images were obtained from the base of the skull through the vertex without intravenous contrast. RADIATION DOSE REDUCTION: This exam was performed according to the departmental dose-optimization program which includes automated exposure control, adjustment of the mA and/or kV according to patient size and/or use of iterative reconstruction technique. COMPARISON:  07/07/2021 FINDINGS: Brain: No evidence of acute infarction, hemorrhage, hydrocephalus, extra-axial collection or mass lesion/mass effect. Vascular: No hyperdense vessel or unexpected calcification. Skull: Normal. Negative for fracture or focal lesion. Sinuses/Orbits: No acute finding. Other: None. IMPRESSION: No acute intracranial  process. Electronically Signed   By: Duanne Guess D.O.  On: 09/21/2021 12:16  ? ?DG Abd Portable 2V ? ?Result Date: 09/20/2021 ?CLINICAL DATA:  Emesis EXAM: PORTABLE ABDOMEN - 2 VIEW COMPARISON:  Prior abdominal radiographs 09/13/2021 FINDINGS: Gastrostomy tube projects over the stomach. Previously ingested oral contrast material has passed. The appendix remains visible overlying the right lower quadrant. Moderate to high volume of formed stool throughout the rectum and colon. No evidence of gastric distension. No evidence of free air. IMPRESSION: 1. No evidence of gastric distension, bowel obstruction or free air. 2. Well-positioned percutaneous gastrostomy tube. 3. Moderate to high volume of formed stool throughout the rectum and colon. Electronically Signed   By: Jacqulynn Cadet M.D.   On: 09/20/2021 13:31   ? ?Assessment/Plan: ?Patient is doing well after a very complicated hospital course following four level ACDF. He would like to discharge home. Continue efforts at mobilization.  ? ?  ? LOS: 84 days  ? ? ? ? ?Viona Gilmore, DNP, AGNP-C ?Nurse Practitioner ? ?[facility] ?[address], [address], [place], [place] [address] ?[id number]    [id number] ? ?09/21/2021, 12:56 PM ? ? ? ? ?

## 2021-09-21 NOTE — Progress Notes (Signed)
Occupational Therapy Treatment ?Patient Details ?Name: Brian [name] ?MRN: [id number] ?DOB: 1960/09/13 ?Today's Date: 09/21/2021 ? ? ?History of present illness Pt is a 61 y/o male admitted 06/29/21 following C3-7 ACDF after progressive cervical myelopathy symptoms including ataxia, bil UE and LE shaking and weakness, and loss of function in his hands with subsequent fall. Pt initially had improvement in neurological symptoms, but postoperative course was complicated by dysphasia and ataxia secondary to and epidural hematoma with evacuation of hematoma on 2/7. Course complicated by delirium secondary to encephalopathy, sepsis secondary to aspiration PNA, reintubated 2/19-2/26 for airway protection, bilat segmental PEs 2/18. PEG placed 3/6. PMHx includes tobacco and EtOH use disorder. ?  ?OT comments ? Pt presenting with increased confusion and disorientation throughout session, causing him to be anxious about his situation. Pt has no recall of where he was, why he was here, or how long he has been here. Session focused on assessing cognition and self feeding. Pt continuing to require mod assist with self feeding, universal cuff not as helpful at this time due to spasms. Continuing to recommend SNF. OT will follow acutely.   ? ?Recommendations for follow up therapy are one component of a multi-disciplinary discharge planning process, led by the attending physician.  Recommendations may be updated based on patient status, additional functional criteria and insurance authorization. ?   ?Follow Up Recommendations ? Skilled nursing-short term rehab (<3 hours/day)  ?  ?Assistance Recommended at Discharge Frequent or constant Supervision/Assistance  ?Patient can return home with the following ? Two people to help with walking and/or transfers;Direct supervision/assist for medications management;Assist for transportation;Help with stairs or ramp for entrance;Assistance with feeding;Two people to help with  bathing/dressing/bathroom;Assistance with cooking/housework;Direct supervision/assist for financial management ?  ?Equipment Recommendations ? Wheelchair (measurements OT);Wheelchair cushion (measurements OT);Hospital bed;BSC/3in1;Tub/shower seat  ?  ?Recommendations for Other Services   ? ?  ?Precautions / Restrictions Precautions ?Precautions: Fall ?Restrictions ?Weight Bearing Restrictions: No  ? ? ?  ? ?Mobility Bed Mobility ?Overal bed mobility: Needs Assistance ?Bed Mobility: Supine to Sit, Sit to Supine ?  ?  ?Supine to sit: Min guard ?Sit to supine: Mod assist ?  ?General bed mobility comments: Pt with difficulty figuring out how to reeturn to supine ?  ? ?Transfers ?  ?  ?  ?  ?  ?  ?  ?  ?  ?General transfer comment: deferred due to pt increased confusion ?  ?  ?Balance Overall balance assessment: Needs assistance ?Sitting-balance support: No upper extremity supported, Feet supported ?Sitting balance-Leahy Scale: Fair ?  ?  ?  ?  ?  ?  ?  ?  ?  ?  ?  ?  ?  ?  ?  ?  ?   ? ?ADL either performed or assessed with clinical judgement  ? ?ADL Overall ADL's : Needs assistance/impaired ?Eating/Feeding: Moderate assistance;With assist to don/doff brace/orthosis;Sitting ?Eating/Feeding Details (indicate cue type and reason): patient required increased assistance on this date due to UE spasms ?Grooming: Wash/dry hands;Wash/dry face;Supervision/safety;Sitting ?Grooming Details (indicate cue type and reason): seated in bed ?  ?  ?  ?  ?  ?  ?  ?  ?  ?  ?  ?  ?  ?  ?  ?General ADL Comments: Sssion focused on self feeding and confusino ?  ? ?Extremity/Trunk Assessment   ?  ?  ?  ?  ?  ? ?Vision   ?  ?  ?Perception   ?  ?Praxis   ?  ? ?  Cognition Arousal/Alertness: Awake/alert ?Behavior During Therapy: Anxious ?Overall Cognitive Status: Impaired/Different from baseline ?Area of Impairment: Orientation, Attention, Memory, Safety/judgement, Awareness, Problem solving ?  ?  ?  ?  ?  ?  ?  ?  ?Orientation Level: Disoriented to,  Place, Time, Situation ?Current Attention Level: Sustained ?Memory: Decreased short-term memory, Decreased recall of precautions ?  ?Safety/Judgement: Decreased awareness of deficits ?Awareness: Emergent ?Problem Solving: Difficulty sequencing, Slow processing, Requires verbal cues, Requires tactile cues ?General Comments: Pt with increased confusion today. He has no recall of any of hospital stay, does not know why he is here, does not remember prior surgeries, very anxious about everything. ?  ?  ?   ?Exercises   ? ?  ?Shoulder Instructions   ? ? ?  ?General Comments VSS on RA - RN in room for most of session  ? ? ?Pertinent Vitals/ Pain       Pain Assessment ?Pain Assessment: No/denies pain ?Pain Score: 0-No pain ? ?Home Living   ?  ?  ?  ?  ?  ?  ?  ?  ?  ?  ?  ?  ?  ?  ?  ?  ?  ?  ? ?  ?Prior Functioning/Environment    ?  ?  ?  ?   ? ?Frequency ? Min 2X/week  ? ? ? ? ?  ?Progress Toward Goals ? ?OT Goals(current goals can now be found in the care plan section) ? Progress towards OT goals: Progressing toward goals ? ?Acute Rehab OT Goals ?Patient Stated Goal: none stated ?OT Goal Formulation: With patient ?Time For Goal Achievement: 10/05/21 ?Potential to Achieve Goals: Good ?ADL Goals ?Pt Will Perform Eating: with min assist;with assist to don/doff brace/orthosis;with adaptive utensils;sitting ?Pt Will Perform Grooming: sitting;with min assist ?Pt Will Perform Upper Body Bathing: with mod assist;sitting ?Pt Will Transfer to Toilet: with min assist;stand pivot transfer;bedside commode ?Pt/caregiver will Perform Home Exercise Program: Increased ROM;Increased strength;Both right and left upper extremity;With theraputty;With minimal assist;With written HEP provided ?Additional ADL Goal #1: Pt will grasp objects with R hand and bring to mouth 10/10 trials in prep for self feeding ?Additional ADL Goal #2: Pt will grasp/release 5/5 objects with L hand to increase indep with ADLs  ?Plan Discharge plan remains  appropriate   ? ?Co-evaluation ? ? ?   ?  ?  ?  ?  ? ?  ?AM-PAC OT "6 Clicks" Daily Activity     ?Outcome Measure ? ? Help from another person eating meals?: A Lot ?Help from another person taking care of personal grooming?: A Lot ?Help from another person toileting, which includes using toliet, bedpan, or urinal?: A Lot ?Help from another person bathing (including washing, rinsing, drying)?: A Lot ?Help from another person to put on and taking off regular upper body clothing?: A Lot ?Help from another person to put on and taking off regular lower body clothing?: A Lot ?6 Click Score: 12 ? ?  ?End of Session   ? ?OT Visit Diagnosis: Other abnormalities of gait and mobility (R26.89);Muscle weakness (generalized) (M62.81);Feeding difficulties (R63.3);Other symptoms and signs involving the nervous system (R29.898);Other symptoms and signs involving cognitive function ?  ?Activity Tolerance Patient tolerated treatment well ?  ?Patient Left in bed;with call bell/phone within reach;with bed alarm set ?  ?Nurse Communication Mobility status ?  ? ?   ? ?Time: 1726-1740 ?OT Time Calculation (min): 14 min ? ?Charges: OT General Charges ?$OT Visit: 1 Visit ?OT Treatments ?$  Self Care/Home Management : 8-22 mins ? ?Zona Pedro H., OTR/L ?Acute Rehabilitation ? ?Quinette Hentges Elane Yolanda Bonine ?09/21/2021, 6:17 PM ?

## 2021-09-21 NOTE — Progress Notes (Signed)
?PROGRESS NOTE/consult ? ? ? ?Brian [name]  [id number] DOB: 1960-08-27 DOA: 06/29/2021 ?PCP: Pcp, No  ? ? ?No chief complaint on file. ? ? 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  He was admitted on 2/3 and underwent ACDF of C3-4, C4-5, C5-5, and C6-7.  Post-operatively he was progressing slowly with residual weakness in both hands but improving lower extremity strength and function.  He developed some difficulty swallowing on 2/5 and SLP ordered and placed on thickened liquids.   Overnight 2/6, patient with increased difficulty swallowing and was made NPO but also noted to have dysarthria and difficulty moving extremities with numbness.  SLP evaluated and found to be moderate aspiration risk.   Also progressively tachycardic, tachypneic, and now febrile.  Code stroke activated and taken for CT/ CTA head and neck and was given decadron 10mg  once.  NIHSS 18.  CTH was negative for acute findings, and CTA head neck did not reveal any LVO but noted significant prevertebral soft tissue swelling with foci of gas present at the ventral epidural space at C4-5, small collection not excluded. On 2/7, patient with worsening confusion and concern for airway involvement, PCCM consulted and remained in the intensive care unit until he is transferred to the floor on 3/2 and PCCM requested transition to [facility] for medical assistance while patient remains under the neurosurgery service. ? ?Significant Hospital Events: ?2/3 ACDF C3-4, C4-5, C5-5, and C6-7 w/ Dr. Annette Stable ?2/6 SLP eval for difficulty swallowing ?2/7 Code stroke overnight, neg for LVO, CT showing soft tissue swelling.  PCCM  consulted for concern of airway management and AMS.  Went for evacuation of hematoma  ?2/18 CT Abd/Pelvis: showing bilateral segmental Pes, started on heparin and showing worsening pneumonia, febrile to  103.  PCT rising, 34.6, restarted on abx ?2/19 possible aspiration w/ severe hypoxia; intubated; Switching from heparin to angiomax given subtherapeutic levels despite increase in rate. ?2/20 Echo shows normal LV systolic function. There was notation of a + McConnell's sign c/w a large pulmonary embolus which is consistent with the finding of bilateral segmental pulmonary emboli noted on CT 07/14/2021. ?2/21 rash appreciated on back; stopped cefepim/vanc switched to zosyn; required low dose levo with increase in fentanyl ?2/22 remains intubated; unable to extubate to do increase RR and thick secretions ?2/23 Ketamine infusion added ?2/24: weaning fentanyl.  ?2/26 extubated ?2/28 tachycardia persists ?3/2: Modified barium swallow, continues n.p.o. with core track in place, likely will need PEG tube. ?3/6: s/p IR G-tube placement ?3/16: Foley catheter discontinued ?3/24: E. coli UTI/bacteremia>> cefazolin x7 days per ID ?4/5-4/6 Had a fall overnight and fell out of the bed and was unwitnessed but patient states he rolled out of the bed and did not get hurt. Has fall precautions now. ?4/92fell while trying to reach for his eyeglasses, reports landed on left knee, reports it is a soft fall, denies left knee pain, denies hit his head ?4/14 urine is cloudy, repeat urine culture + Citrobacter Freundii that is Sensitive to Bactrim,  ?4/16 reinserted foley due to recurrent urinary retention  ?medically clear awaiting SNF placement; difficult to place per TOC. ?4/19-4/20 Vomited overnight; is recently started on regular diet with thin liquids and has  continuous tube feeds were transitioned to nightly only.  Vomited about 350 cc of tube feed material early this morning.  As needed Zofran given and his TF were stopped.  ?4/21 No  Nausea overnight and KUB yesterday and showed "Nonobstructive bowel gas pattern. Moderate amount of oral contrast material throughout the colon." He was started back on IVF and will now stop after 12 hours given improvement in Nausea and Vomiting ?4/22 - He is feeling well and tolerating diet. Having some hand cramping. Continuing Rehabilitative efforts. ?4/23 - Exercising in the bed and wanting to sit in the chair. Continues to have some Hand Cramping. Had an unwitnessed fall without loss of consciousness or hitting head.  ?  ? ? ?Assessment & Plan: ? Principal Problem: ?  Cervical myelopathy (HCC); severe cervical spinal stenosis with cord compression s/p ACDF C3-7 on 2/3, complicated by epidural hematoma s/p evacuation 2/7 ?Active Problems: ?  Acute respiratory failure with hypoxia (HCC) ?  Acute pulmonary embolism (HCC) ?  Acute metabolic encephalopathy ?  Severe sepsis due to recurrent aspiration pneumonia ?  E. coli bacteremia ?  Sinus tachycardia ?  Elevated liver enzymes ?  Hyponatremia ?  Tobacco use disorder ?  Physical deconditioning ?  Urinary retention ?  Protein-calorie malnutrition, severe (HCC) ?  Lumbar back pain ?  Fall during current hospitalization ?  Normocytic anemia ?  Thrombocytosis ?  Citrobacter infection ?  Vomiting ?  Abnormal LFTs ?  Aspiration pneumonia (HCC) ? ? ? ?Assessment and Plan: ?* Cervical myelopathy (HCC); severe cervical spinal stenosis with cord compression s/p ACDF C3-7 on 2/3, complicated by epidural hematoma s/p evacuation 2/7 ?-Patient initially presented s/p recent fall with progressive upper and lower extremity weakness and tremors, He was found to have critical multilevel cervical spinal stenosis @ C3-6 with spinal cord compression and spinal cord signal change and underwent ACDF C3-4, C4-5, C5-5, and C6/7 by neurosurgery Dr. Dutch Quint on 06/29/2021.  Postoperative complicated by epidural hematoma s/p evacuation on 2/7. ?-Neurosurgery feels he is doing well after a  complicated Hospital course following Four Level ACDF ?-Continue PT/OT/SLP efforts ?-Awaiting SNF placement per TOC ?-Further as per neurosurgery recommendation. ? ?Acute respiratory failure with hypoxia (HCC) ?-Likely Multifactorial with significant dysphagia and recurrent aspiration pneumonia events during initial hospitalization following ACDF surgery with postoperative complications of postoperative cervical hematoma.   ?-Patient did require ventilatory support in the intensive care unit and was successfully extubated on 07/22/2021.  Patient completed extensive course of empiric antibiotics.   ?-SpO2: 100 % ?O2 Flow Rate (L/min): 0 L/min ?FiO2 (%): 40 %; Oxygen now weaned off, on room air with sats of 100%. ?-Continue Aspiration Precautions ? ?Acute pulmonary embolism (HCC) ?-CTA chest showed bilateral segmental PE.  ?-LE [place] negative for DVT.   ?-TTE with normal LV systolic function.  Supplemental oxygen weaned off. ?-Continue Eliquis 5 mg twice daily.   ?-Outpatient follow-up. ? ?Acute metabolic encephalopathy ?-CT head, MRI brain, EEG, TSH, B12, ammonia unrevealing.  Etiology likely multifactorial with acute respiratory failure secondary to aspiration pneumonia, bilateral pulmonary embolism.   ?-Completed course of antibiotics for aspiration pneumonia.  Seroquel and clonazepam discontinued.  Completed course of antibiotics for E. coli UTI/septicemia. ?-C/w Supportive care ? ?E. coli bacteremia ?-Urine culture and blood cultures x2 08/17/2021 positive for E. coli.  ?-Evaluated by Infectious Disease.   ?-Completed 7-day course of antibiotics per ID. ? ?Severe sepsis due to recurrent aspiration pneumonia ?-Completed extensive course of antibiotics while under ICU care. S/p IR gtube  3/6. ?-Was receiving tube feeds nocturnally however held on 09/20/2021 as patient noted to have vomiting.  ?-Cleared for regular diet with thin liquids per SLP; but with very little oral intake per RN; and patient with little desire for  oral intake. ?-Continue aspiration precautions ?-Continue SLP efforts while inpatient ? ?Sinus tachycardia ?-Multifactorial including sepsis, dehydration, PE, agitation.   ?-Continue Metoprolol 12.5 mg twice

## 2021-09-22 ENCOUNTER — Inpatient Hospital Stay (HOSPITAL_COMMUNITY): Payer: 59

## 2021-09-22 DIAGNOSIS — R7989 Other specified abnormal findings of blood chemistry: Secondary | ICD-10-CM | POA: Diagnosis not present

## 2021-09-22 DIAGNOSIS — G959 Disease of spinal cord, unspecified: Secondary | ICD-10-CM | POA: Diagnosis not present

## 2021-09-22 DIAGNOSIS — G9341 Metabolic encephalopathy: Secondary | ICD-10-CM | POA: Diagnosis not present

## 2021-09-22 DIAGNOSIS — I2601 Septic pulmonary embolism with acute cor pulmonale: Secondary | ICD-10-CM | POA: Diagnosis not present

## 2021-09-22 LAB — GLUCOSE, CAPILLARY
Glucose-Capillary: 114 mg/dL — ABNORMAL HIGH (ref 70–99)
Glucose-Capillary: 119 mg/dL — ABNORMAL HIGH (ref 70–99)
Glucose-Capillary: 127 mg/dL — ABNORMAL HIGH (ref 70–99)
Glucose-Capillary: 142 mg/dL — ABNORMAL HIGH (ref 70–99)
Glucose-Capillary: 148 mg/dL — ABNORMAL HIGH (ref 70–99)
Glucose-Capillary: 156 mg/dL — ABNORMAL HIGH (ref 70–99)

## 2021-09-22 LAB — BASIC METABOLIC PANEL
Anion gap: 8 (ref 5–15)
BUN: 26 mg/dL — ABNORMAL HIGH (ref 6–20)
CO2: 26 mmol/L (ref 22–32)
Calcium: 10.6 mg/dL — ABNORMAL HIGH (ref 8.9–10.3)
Chloride: 102 mmol/L (ref 98–111)
Creatinine, Ser: 0.8 mg/dL (ref 0.61–1.24)
GFR, Estimated: >60 mL/min (ref >60)
Glucose, Bld: 114 mg/dL — ABNORMAL HIGH (ref 70–99)
Potassium: 4.3 mmol/L (ref 3.5–5.1)
Sodium: 136 mmol/L (ref 135–145)

## 2021-09-22 LAB — AMMONIA: Ammonia: 31 umol/L (ref 9–35)

## 2021-09-22 LAB — MAGNESIUM: Magnesium: 2.2 mg/dL (ref 1.7–2.4)

## 2021-09-22 IMAGING — DX DG ABDOMEN 1V
1 series · 1 of 1 positions shown · non-contrast
Comparison: [DATE].

CLINICAL DATA: A 60-year-old male presents with abdominal pain.

EXAM:
ABDOMEN - 1 VIEW

[abdomen supine]
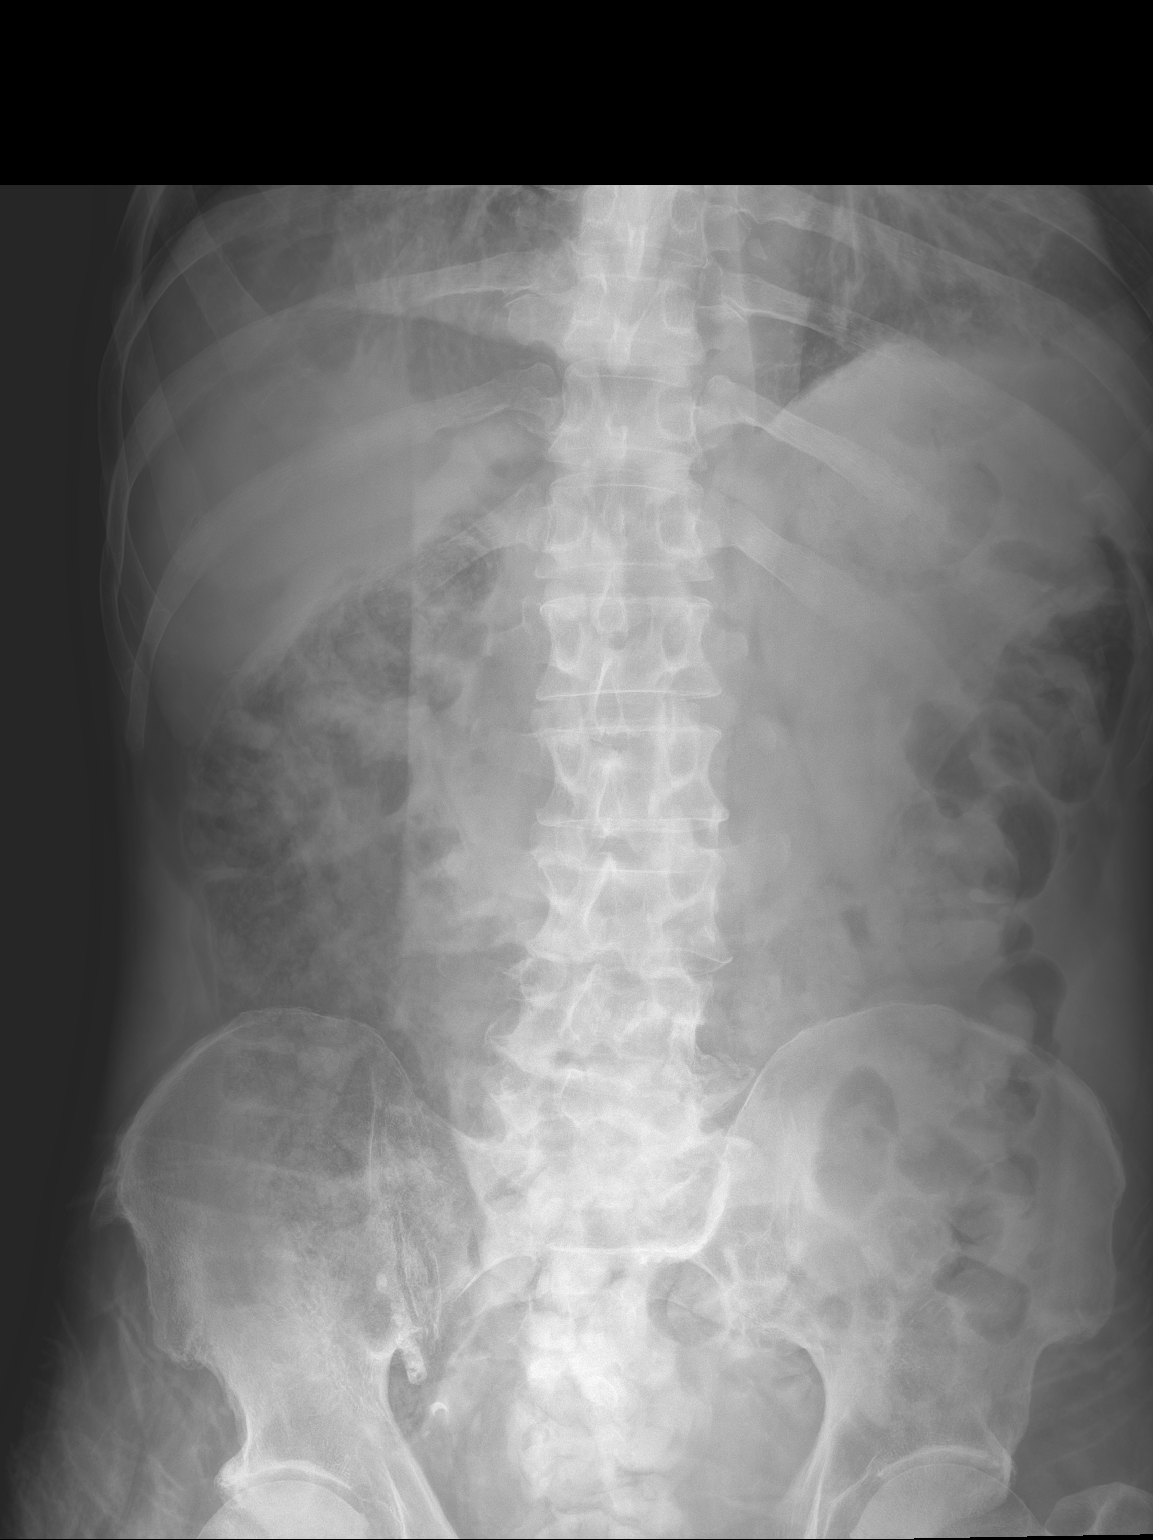

[1 of 1 positions shown; findings below may reference images not displayed]

FINDINGS: G-tube in a longer visualized over the abdomen. Images rotated
slightly.

Lung bases grossly clear.

Bowel gas pattern without signs of obstruction with moderate stool
throughout the colon and moderate rectal stool in particular.

On limited assessment there is no acute skeletal finding. Signs of
spinal degenerative change.
IMPRESSION: 1. Moderate stool throughout the colon and moderate rectal stool in
particular. No evidence of bowel obstruction.
2. Interval removal of PEG tube.

## 2021-09-22 MED ORDER — ENSURE ENLIVE PO LIQD
237.0000 mL | Freq: Three times a day (TID) | ORAL | Status: DC
  Administered 2021-09-22 – 2021-09-24 (×6): 237 mL via ORAL

## 2021-09-22 MED ORDER — SORBITOL 70 % SOLN
960.0000 mL | TOPICAL_OIL | Freq: Once | ORAL | Status: AC
  Administered 2021-09-22: 960 mL via RECTAL
  Filled 2021-09-22: qty 473

## 2021-09-22 NOTE — Progress Notes (Signed)
?PROGRESS NOTE/consult ? ? ? ?Brian [name]  [id number] DOB: 01-08-1961 DOA: 06/29/2021 ?PCP: Pcp, No  ? ? ?No chief complaint on file. ? ? 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  He was admitted on 2/3 and underwent ACDF of C3-4, C4-5, C5-5, and C6-7.  Post-operatively he was progressing slowly with residual weakness in both hands but improving lower extremity strength and function.  He developed some difficulty swallowing on 2/5 and SLP ordered and placed on thickened liquids.   Overnight 2/6, patient with increased difficulty swallowing and was made NPO but also noted to have dysarthria and difficulty moving extremities with numbness.  SLP evaluated and found to be moderate aspiration risk.   Also progressively tachycardic, tachypneic, and now febrile.  Code stroke activated and taken for CT/ CTA head and neck and was given decadron 10mg  once.  NIHSS 18.  CTH was negative for acute findings, and CTA head neck did not reveal any LVO but noted significant prevertebral soft tissue swelling with foci of gas present at the ventral epidural space at C4-5, small collection not excluded. On 2/7, patient with worsening confusion and concern for airway involvement, PCCM consulted and remained in the intensive care unit until he is transferred to the floor on 3/2 and PCCM requested transition to [facility] for medical assistance while patient remains under the neurosurgery service. ? ?Significant Hospital Events: ?2/3 ACDF C3-4, C4-5, C5-5, and C6-7 w/ Dr. Annette Stable ?2/6 SLP eval for difficulty swallowing ?2/7 Code stroke overnight, neg for LVO, CT showing soft tissue swelling.  PCCM  consulted for concern of airway management and AMS.  Went for evacuation of hematoma  ?2/18 CT Abd/Pelvis: showing bilateral segmental Pes, started on heparin and showing worsening pneumonia, febrile to  103.  PCT rising, 34.6, restarted on abx ?2/19 possible aspiration w/ severe hypoxia; intubated; Switching from heparin to angiomax given subtherapeutic levels despite increase in rate. ?2/20 Echo shows normal LV systolic function. There was notation of a + McConnell's sign c/w a large pulmonary embolus which is consistent with the finding of bilateral segmental pulmonary emboli noted on CT 07/14/2021. ?2/21 rash appreciated on back; stopped cefepim/vanc switched to zosyn; required low dose levo with increase in fentanyl ?2/22 remains intubated; unable to extubate to do increase RR and thick secretions ?2/23 Ketamine infusion added ?2/24: weaning fentanyl.  ?2/26 extubated ?2/28 tachycardia persists ?3/2: Modified barium swallow, continues n.p.o. with core track in place, likely will need PEG tube. ?3/6: s/p IR G-tube placement ?3/16: Foley catheter discontinued ?3/24: E. coli UTI/bacteremia>> cefazolin x7 days per ID ?4/5-4/6 Had a fall overnight and fell out of the bed and was unwitnessed but patient states he rolled out of the bed and did not get hurt. Has fall precautions now. ?4/51fell while trying to reach for his eyeglasses, reports landed on left knee, reports it is a soft fall, denies left knee pain, denies hit his head ?4/14 urine is cloudy, repeat urine culture + Citrobacter Freundii that is Sensitive to Bactrim,  ?4/16 reinserted foley due to recurrent urinary retention  ?medically clear awaiting SNF placement; difficult to place per TOC. ?4/19-4/20 Vomited overnight; is recently started on regular diet with thin liquids and has  continuous tube feeds were transitioned to nightly only.  Vomited about 350 cc of tube feed material early this morning.  As needed Zofran given and his TF were stopped.  ?4/21 No  Nausea overnight and KUB yesterday and showed "Nonobstructive bowel gas pattern. Moderate amount of oral contrast material throughout the colon." He was started back on IVF and will now stop after 12 hours given improvement in Nausea and Vomiting ?4/22 - He is feeling well and tolerating diet. Having some hand cramping. Continuing Rehabilitative efforts. ?4/23 - Exercising in the bed and wanting to sit in the chair. Continues to have some Hand Cramping. Had an unwitnessed fall without loss of consciousness or hitting head.  ?  ? ? ?Assessment & Plan: ? Principal Problem: ?  Cervical myelopathy ([place]); severe cervical spinal stenosis with cord compression s/p ACDF [id number] on 2/3, complicated by epidural hematoma s/p evacuation 2/7 ?Active Problems: ?  Acute respiratory failure with hypoxia ([place]) ?  Acute pulmonary embolism ([place]) ?  Acute metabolic encephalopathy ?  Severe sepsis due to recurrent aspiration pneumonia ?  E. coli bacteremia ?  Sinus tachycardia ?  Elevated liver enzymes ?  Hyponatremia ?  Tobacco use disorder ?  Physical deconditioning ?  Urinary retention ?  Protein-calorie malnutrition, severe ([place]) ?  Lumbar back pain ?  Fall during current hospitalization ?  Normocytic anemia ?  Thrombocytosis ?  Citrobacter infection ?  Vomiting ?  Abnormal LFTs ?  Aspiration pneumonia ([place]) ? ? ? ?Assessment and Plan: ?* Cervical myelopathy (HCC); severe cervical spinal stenosis with cord compression s/p ACDF [id number] on 2/3, complicated by epidural hematoma s/p evacuation 2/7 ?-Patient initially presented s/p recent fall with progressive upper and lower extremity weakness and tremors, He was found to have critical multilevel cervical spinal stenosis @ C3-6 with spinal cord compression and spinal cord signal change and underwent ACDF C3-4, C4-5, C5-5, and C6/7 by neurosurgery Dr. Trenton Gammon on 06/29/2021.  Postoperative complicated by epidural hematoma s/p evacuation on 2/7. ?-Neurosurgery feels he is doing well after a  complicated Hospital course following Four Level ACDF ?-Continue PT/OT/SLP efforts ?-Awaiting SNF placement per TOC ?-Further as per neurosurgery recommendation. ? ?Acute respiratory failure with hypoxia ([place]) ?-Likely Multifactorial with significant dysphagia and recurrent aspiration pneumonia events during initial hospitalization following ACDF surgery with postoperative complications of postoperative cervical hematoma.   ?-Patient did require ventilatory support in the intensive care unit and was successfully extubated on 07/22/2021.  Patient completed extensive course of empiric antibiotics.   ?-SpO2: 100 % ?O2 Flow Rate (L/min): 0 L/min ?FiO2 (%): 40 %; Oxygen now weaned off, on room air with sats of 100%. ?-Continue Aspiration Precautions ? ?Acute pulmonary embolism ([place]) ?-CTA chest showed bilateral segmental PE.  ?-LE [place] negative for DVT.   ?-TTE with normal LV systolic function.  Supplemental oxygen weaned off. ?-Continue Eliquis 5 mg twice daily.   ?-Outpatient follow-up. ? ?Acute metabolic encephalopathy ?-CT head, MRI brain, EEG, TSH, B12, ammonia unrevealing.  Etiology likely multifactorial with acute respiratory failure secondary to aspiration pneumonia, bilateral pulmonary embolism.   ?-Completed course of antibiotics for aspiration pneumonia.  Seroquel and clonazepam discontinued.  Completed course of antibiotics for E. coli UTI/septicemia. ?-C/w Supportive care ? ?-Patient overnight 09/21/2021 noted to have some bouts of confusion. ?-Patient stated had a nightmare and that caused the confusion. ?-Patient alert and oriented to self place and time. ?-No further confusion noted today 09/22/2021. ?-Follow-up. ? ?E. coli bacteremia ?-Urine culture and blood cultures x2 08/17/2021 positive  for E. coli.  ?-Evaluated by Infectious Disease.   ?-Status post 7-day course of antibiotics per ID.  ? ?Severe sepsis due to recurrent aspiration pneumonia ?-Completed extensive course of antibiotics while under ICU care.  S/p IR gtube 3/6. ?-Was receiving tube feeds nocturnally however held on 09/20/2021 as patient noted to have vomiting.  ?-Cleared for regular diet with thin liquids per SLP; but with very little oral intake

## 2021-09-22 NOTE — Progress Notes (Signed)
Rn reports pt pulled out peg. Has abd pain. Will obtain kub.

## 2021-09-22 NOTE — Progress Notes (Signed)
Subjective: ?Patient reports  doing all right neurologically has some confusion apparently pulled out his PEG tube overnight ? ?Objective: ?Vital signs in last 24 hours: ?Temp:  [97.8 ?F (36.6 ?C)-98.5 ?F (36.9 ?C)] 97.8 ?F (36.6 ?C) (04/29 0749) ?Pulse Rate:  [97-110] 100 (04/29 0749) ?Resp:  [16-18] 16 (04/29 0749) ?BP: (130-150)/(84-108) 150/104 (04/29 0749) ?SpO2:  [100 %] 100 % (04/29 0749) ? ?Intake/Output from previous day: ?04/28 0701 - 04/29 0700 ?In: 850 [P.O.:750] ?Out: 1687 [id number] ?Intake/Output this shift: ?No intake/output data recorded. ? ?Stable neurologically ? ?Lab Results: ?Recent Labs  ?  09/20/21 ?0310 09/21/21 ?0139  ?WBC 6.3 6.0  ?HGB 8.6* 9.7*  ?HCT 27.7* 29.7*  ?PLT 291 327  ? ?BMET ?Recent Labs  ?  09/21/21 ?0139 09/22/21 ?0225  ?NA 136 136  ?K 3.9 4.3  ?CL 104 102  ?CO2 27 26  ?GLUCOSE 100* 114*  ?BUN 26* 26*  ?CREATININE 0.87 0.80  ?CALCIUM 10.3 10.6*  ? ? ?Studies/Results: ?DG Abd 1 View ? ?Result Date: 09/22/2021 ?CLINICAL DATA:  A 61 year old male presents with abdominal pain. EXAM: ABDOMEN - 1 VIEW COMPARISON:  September 20, 2021. FINDINGS: G-tube in a longer visualized over the abdomen. Images rotated slightly. Lung bases grossly clear. Bowel gas pattern without signs of obstruction with moderate stool throughout the colon and moderate rectal stool in particular. On limited assessment there is no acute skeletal finding. Signs of spinal degenerative change. IMPRESSION: 1. Moderate stool throughout the colon and moderate rectal stool in particular. No evidence of bowel obstruction. 2. Interval removal of PEG tube. Electronically Signed   By: Zetta Bills M.D.   On: 09/22/2021 08:31  ? ?CT HEAD WO CONTRAST (5MM) ? ?Result Date: 09/21/2021 ?CLINICAL DATA:  Mental status change, persistent or worsening EXAM: CT HEAD WITHOUT CONTRAST TECHNIQUE: Contiguous axial images were obtained from the base of the skull through the vertex without intravenous contrast. RADIATION DOSE REDUCTION: This  exam was performed according to the departmental dose-optimization program which includes automated exposure control, adjustment of the mA and/or kV according to patient size and/or use of iterative reconstruction technique. COMPARISON:  07/07/2021 FINDINGS: Brain: No evidence of acute infarction, hemorrhage, hydrocephalus, extra-axial collection or mass lesion/mass effect. Vascular: No hyperdense vessel or unexpected calcification. Skull: Normal. Negative for fracture or focal lesion. Sinuses/Orbits: No acute finding. Other: None. IMPRESSION: No acute intracranial process. Electronically Signed   By: Davina Poke D.O.   On: 09/21/2021 12:16  ? ?DG Abd Portable 2V ? ?Result Date: 09/20/2021 ?CLINICAL DATA:  Emesis EXAM: PORTABLE ABDOMEN - 2 VIEW COMPARISON:  Prior abdominal radiographs 09/13/2021 FINDINGS: Gastrostomy tube projects over the stomach. Previously ingested oral contrast material has passed. The appendix remains visible overlying the right lower quadrant. Moderate to high volume of formed stool throughout the rectum and colon. No evidence of gastric distension. No evidence of free air. IMPRESSION: 1. No evidence of gastric distension, bowel obstruction or free air. 2. Well-positioned percutaneous gastrostomy tube. 3. Moderate to high volume of formed stool throughout the rectum and colon. Electronically Signed   By: Jacqulynn Cadet M.D.   On: 09/20/2021 13:31   ? ?Assessment/Plan: ? ?61 year old gentleman with cervical myelopathy awaiting possible discharge home versus skilled nursing.  Patient had some mental status changes is currently being worked up and confusion with abdominal pain work-up so far has been negative however patient did pull out his PEG tube last night.  Sounds like he is only getting tube feeds at night so I think that  will defer to the medicine team to see if the patient's get adequate caloric intake during the day to where he needs the PEG tube or not.  Initial medical work-up  appears to be negative for occult sign of infection however unclear exact cause of confusion. ? LOS: 85 days  ? ? ? ?Elaina Hoops ?09/22/2021, 8:52 AM ? ? ? ? ?

## 2021-09-22 NOTE — Progress Notes (Addendum)
Nutrition Consult Brief Note ? ?Received consult for calorie count. ?Patient accidentally pulled his PEG out last night. He has been receiving nocturnal tube feeding via PEG over 16 hours due to minimal oral intake. Currently on a regular diet with meal intakes documented at 0-25%. Suspect he will need PEG replaced.  ? ?INTERVENTION: ?Calorie count x 48 hours, RD to follow-up with results on Monday. ?Continue regular diet, encourage POs. ?Add Ensure Enlive po TID, each supplement provides 350 kcal and 20 grams of protein. ? ? ?Gabriel Rainwater RD, LDN, CNSC ?Please refer to Amion for contact information.                                                       ? ?
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
## 2021-09-23 DIAGNOSIS — G9341 Metabolic encephalopathy: Secondary | ICD-10-CM | POA: Diagnosis not present

## 2021-09-23 DIAGNOSIS — R7989 Other specified abnormal findings of blood chemistry: Secondary | ICD-10-CM | POA: Diagnosis not present

## 2021-09-23 DIAGNOSIS — G959 Disease of spinal cord, unspecified: Secondary | ICD-10-CM | POA: Diagnosis not present

## 2021-09-23 DIAGNOSIS — I2601 Septic pulmonary embolism with acute cor pulmonale: Secondary | ICD-10-CM | POA: Diagnosis not present

## 2021-09-23 LAB — GLUCOSE, CAPILLARY
Glucose-Capillary: 100 mg/dL — ABNORMAL HIGH (ref 70–99)
Glucose-Capillary: 106 mg/dL — ABNORMAL HIGH (ref 70–99)
Glucose-Capillary: 107 mg/dL — ABNORMAL HIGH (ref 70–99)
Glucose-Capillary: 108 mg/dL — ABNORMAL HIGH (ref 70–99)
Glucose-Capillary: 113 mg/dL — ABNORMAL HIGH (ref 70–99)
Glucose-Capillary: 93 mg/dL (ref 70–99)

## 2021-09-23 LAB — BASIC METABOLIC PANEL
Anion gap: 7 (ref 5–15)
BUN: 32 mg/dL — ABNORMAL HIGH (ref 6–20)
CO2: 24 mmol/L (ref 22–32)
Calcium: 10 mg/dL (ref 8.9–10.3)
Chloride: 103 mmol/L (ref 98–111)
Creatinine, Ser: 0.8 mg/dL (ref 0.61–1.24)
GFR, Estimated: >60 mL/min (ref >60)
Glucose, Bld: 105 mg/dL — ABNORMAL HIGH (ref 70–99)
Potassium: 4.2 mmol/L (ref 3.5–5.1)
Sodium: 134 mmol/L — ABNORMAL LOW (ref 135–145)

## 2021-09-23 MED ORDER — ADULT MULTIVITAMIN W/MINERALS CH
1.0000 | ORAL_TABLET | Freq: Every day | ORAL | Status: DC
  Administered 2021-09-23 – 2021-11-02 (×41): 1 via ORAL
  Filled 2021-09-23 (×41): qty 1

## 2021-09-23 MED ORDER — POLYETHYLENE GLYCOL 3350 17 G PO PACK
17.0000 g | PACK | Freq: Two times a day (BID) | ORAL | Status: DC
  Administered 2021-09-23 – 2021-09-29 (×7): 17 g via ORAL
  Filled 2021-09-23 (×10): qty 1

## 2021-09-23 MED ORDER — HYDROCODONE-ACETAMINOPHEN 7.5-325 MG/15ML PO SOLN
10.0000 mL | ORAL | Status: DC | PRN
  Filled 2021-09-23: qty 30

## 2021-09-23 MED ORDER — DIPHENHYDRAMINE HCL 25 MG PO CAPS: 25.0000 mg | ORAL_CAPSULE | Freq: Four times a day (QID) | ORAL | Status: DC | PRN

## 2021-09-23 MED ORDER — THIAMINE HCL 100 MG PO TABS
100.0000 mg | ORAL_TABLET | Freq: Every day | ORAL | Status: DC
  Administered 2021-09-23 – 2021-11-02 (×41): 100 mg via ORAL
  Filled 2021-09-23 (×41): qty 1

## 2021-09-23 MED ORDER — METOPROLOL TARTRATE 25 MG/10 ML ORAL SUSPENSION
12.5000 mg | Freq: Two times a day (BID) | ORAL | Status: DC
  Administered 2021-09-23 – 2021-09-29 (×14): 12.5 mg via ORAL
  Filled 2021-09-23 (×15): qty 5

## 2021-09-23 MED ORDER — DOCUSATE SODIUM 50 MG/5ML PO LIQD: 50.0000 mg | Freq: Two times a day (BID) | ORAL | Status: DC | PRN

## 2021-09-23 MED ORDER — FOLIC ACID 1 MG PO TABS
1.0000 mg | ORAL_TABLET | Freq: Every day | ORAL | Status: DC
  Administered 2021-09-23 – 2021-11-02 (×41): 1 mg via ORAL
  Filled 2021-09-23 (×41): qty 1

## 2021-09-23 MED ORDER — SENNOSIDES 8.8 MG/5ML PO SYRP
5.0000 mL | ORAL_SOLUTION | Freq: Two times a day (BID) | ORAL | Status: DC
  Administered 2021-09-23 – 2021-09-29 (×8): 5 mL via ORAL
  Filled 2021-09-23 (×15): qty 5

## 2021-09-23 NOTE — Progress Notes (Signed)
Subjective: ?Patient reports  overall patient doing better today ? ?Objective: ?Vital signs in last 24 hours: ?Temp:  [97.9 ?F (36.6 ?C)-98.7 ?F (37.1 ?C)] 97.9 ?F (36.6 ?C) (04/30 0745) ?Pulse Rate:  [99-118] 108 (04/30 0745) ?Resp:  [15-16] 16 (04/30 0745) ?BP: (101-136)/(75-95) 116/80 (04/30 0745) ?SpO2:  [99 %-100 %] 99 % (04/30 0745) ?Weight:  [53.4 kg] 53.4 kg (04/30 0500) ? ?Intake/Output from previous day: ?04/29 0701 - 04/30 0700 ?In: 240 [P.O.:240] ?Out: 1300 [Urine:1300] ?Intake/Output this shift: ?No intake/output data recorded. ? ?Awake alert strength 4+ out of 5 ? ?Lab Results: ?Recent Labs  ?  09/21/21 ?0139  ?WBC 6.0  ?HGB 9.7*  ?HCT 29.7*  ?PLT 327  ? ?BMET ?Recent Labs  ?  09/22/21 ?0225 09/23/21 ?0141  ?NA 136 134*  ?K 4.3 4.2  ?CL 102 103  ?CO2 26 24  ?GLUCOSE 114* 105*  ?BUN 26* 32*  ?CREATININE 0.80 0.80  ?CALCIUM 10.6* 10.0  ? ? ?Studies/Results: ?DG Abd 1 View ? ?Result Date: 09/22/2021 ?CLINICAL DATA:  A 61 year old male presents with abdominal pain. EXAM: ABDOMEN - 1 VIEW COMPARISON:  September 20, 2021. FINDINGS: G-tube in a longer visualized over the abdomen. Images rotated slightly. Lung bases grossly clear. Bowel gas pattern without signs of obstruction with moderate stool throughout the colon and moderate rectal stool in particular. On limited assessment there is no acute skeletal finding. Signs of spinal degenerative change. IMPRESSION: 1. Moderate stool throughout the colon and moderate rectal stool in particular. No evidence of bowel obstruction. 2. Interval removal of PEG tube. Electronically Signed   By: Donzetta Kohut M.D.   On: 09/22/2021 08:31  ? ?CT HEAD WO CONTRAST ( ) ? ?Result Date: 09/21/2021 ?CLINICAL DATA:  Mental status change, persistent or worsening EXAM: CT HEAD WITHOUT CONTRAST TECHNIQUE: Contiguous axial images were obtained from the base of the skull through the vertex without intravenous contrast. RADIATION DOSE REDUCTION: This exam was performed according to the  departmental dose-optimization program which includes automated exposure control, adjustment of the mA and/or kV according to patient size and/or use of iterative reconstruction technique. COMPARISON:  07/07/2021 FINDINGS: Brain: No evidence of acute infarction, hemorrhage, hydrocephalus, extra-axial collection or mass lesion/mass effect. Vascular: No hyperdense vessel or unexpected calcification. Skull: Normal. Negative for fracture or focal lesion. Sinuses/Orbits: No acute finding. Other: None. IMPRESSION: No acute intracranial process. Electronically Signed   By: Duanne Guess D.O.   On: 09/21/2021 12:16   ? ?Assessment/Plan: ?Continue to work with physical Occupational Therapy ? LOS: 86 days  ? ? ? ?Mariam Dollar ?09/23/2021, 9:08 AM ? ? ? ? ?

## 2021-09-23 NOTE — Progress Notes (Signed)
?PROGRESS NOTE/consult ? ? ? ?Brian [name]  [id number] DOB: 1960-08-27 DOA: 06/29/2021 ?PCP: Pcp, No  ? ? ?No chief complaint on file. ? ? 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  He was admitted on 2/3 and underwent ACDF of C3-4, C4-5, C5-5, and C6-7.  Post-operatively he was progressing slowly with residual weakness in both hands but improving lower extremity strength and function.  He developed some difficulty swallowing on 2/5 and SLP ordered and placed on thickened liquids.   Overnight 2/6, patient with increased difficulty swallowing and was made NPO but also noted to have dysarthria and difficulty moving extremities with numbness.  SLP evaluated and found to be moderate aspiration risk.   Also progressively tachycardic, tachypneic, and now febrile.  Code stroke activated and taken for CT/ CTA head and neck and was given decadron 10mg  once.  NIHSS 18.  CTH was negative for acute findings, and CTA head neck did not reveal any LVO but noted significant prevertebral soft tissue swelling with foci of gas present at the ventral epidural space at C4-5, small collection not excluded. On 2/7, patient with worsening confusion and concern for airway involvement, PCCM consulted and remained in the intensive care unit until he is transferred to the floor on 3/2 and PCCM requested transition to [facility] for medical assistance while patient remains under the neurosurgery service. ? ?Significant Hospital Events: ?2/3 ACDF C3-4, C4-5, C5-5, and C6-7 w/ Dr. Annette Stable ?2/6 SLP eval for difficulty swallowing ?2/7 Code stroke overnight, neg for LVO, CT showing soft tissue swelling.  PCCM  consulted for concern of airway management and AMS.  Went for evacuation of hematoma  ?2/18 CT Abd/Pelvis: showing bilateral segmental Pes, started on heparin and showing worsening pneumonia, febrile to  103.  PCT rising, 34.6, restarted on abx ?2/19 possible aspiration w/ severe hypoxia; intubated; Switching from heparin to angiomax given subtherapeutic levels despite increase in rate. ?2/20 Echo shows normal LV systolic function. There was notation of a + McConnell's sign c/w a large pulmonary embolus which is consistent with the finding of bilateral segmental pulmonary emboli noted on CT 07/14/2021. ?2/21 rash appreciated on back; stopped cefepim/vanc switched to zosyn; required low dose levo with increase in fentanyl ?2/22 remains intubated; unable to extubate to do increase RR and thick secretions ?2/23 Ketamine infusion added ?2/24: weaning fentanyl.  ?2/26 extubated ?2/28 tachycardia persists ?3/2: Modified barium swallow, continues n.p.o. with core track in place, likely will need PEG tube. ?3/6: s/p IR G-tube placement ?3/16: Foley catheter discontinued ?3/24: E. coli UTI/bacteremia>> cefazolin x7 days per ID ?4/5-4/6 Had a fall overnight and fell out of the bed and was unwitnessed but patient states he rolled out of the bed and did not get hurt. Has fall precautions now. ?4/92fell while trying to reach for his eyeglasses, reports landed on left knee, reports it is a soft fall, denies left knee pain, denies hit his head ?4/14 urine is cloudy, repeat urine culture + Citrobacter Freundii that is Sensitive to Bactrim,  ?4/16 reinserted foley due to recurrent urinary retention  ?medically clear awaiting SNF placement; difficult to place per TOC. ?4/19-4/20 Vomited overnight; is recently started on regular diet with thin liquids and has  continuous tube feeds were transitioned to nightly only.  Vomited about 350 cc of tube feed material early this morning.  As needed Zofran given and his TF were stopped.  ?4/21 No  Nausea overnight and KUB yesterday and showed "Nonobstructive bowel gas pattern. Moderate amount of oral contrast material throughout the colon." He was started back on IVF and will now stop after 12 hours given improvement in Nausea and Vomiting ?4/22 - He is feeling well and tolerating diet. Having some hand cramping. Continuing Rehabilitative efforts. ?4/23 - Exercising in the bed and wanting to sit in the chair. Continues to have some Hand Cramping. Had an unwitnessed fall without loss of consciousness or hitting head.  ?  ? ? ?Assessment & Plan: ? Principal Problem: ?  Cervical myelopathy (HCC); severe cervical spinal stenosis with cord compression s/p ACDF C3-7 on 2/3, complicated by epidural hematoma s/p evacuation 2/7 ?Active Problems: ?  Acute respiratory failure with hypoxia (HCC) ?  Acute pulmonary embolism (HCC) ?  Acute metabolic encephalopathy ?  Severe sepsis due to recurrent aspiration pneumonia ?  E. coli bacteremia ?  Sinus tachycardia ?  Elevated liver enzymes ?  Hyponatremia ?  Tobacco use disorder ?  Physical deconditioning ?  Urinary retention ?  Protein-calorie malnutrition, severe (HCC) ?  Lumbar back pain ?  Fall during current hospitalization ?  Normocytic anemia ?  Thrombocytosis ?  Citrobacter infection ?  Vomiting ?  Abnormal LFTs ?  Aspiration pneumonia (HCC) ? ? ? ?Assessment and Plan: ?* Cervical myelopathy (HCC); severe cervical spinal stenosis with cord compression s/p ACDF C3-7 on 2/3, complicated by epidural hematoma s/p evacuation 2/7 ?-Patient initially presented s/p recent fall with progressive upper and lower extremity weakness and tremors, He was found to have critical multilevel cervical spinal stenosis @ C3-6 with spinal cord compression and spinal cord signal change and underwent ACDF C3-4, C4-5, C5-5, and C6/7 by neurosurgery Dr. Dutch Quint on 06/29/2021.  Postoperative complicated by epidural hematoma s/p evacuation on 2/7. ?-Neurosurgery feels he is doing well after a  complicated Hospital course following Four Level ACDF ?-Continue PT/OT/SLP efforts ?-Awaiting SNF placement per TOC ?-Further as per neurosurgery recommendation. ? ?Acute respiratory failure with hypoxia (HCC) ?-Likely Multifactorial with significant dysphagia and recurrent aspiration pneumonia events during initial hospitalization following ACDF surgery with postoperative complications of postoperative cervical hematoma.   ?-Patient did require ventilatory support in the intensive care unit and was successfully extubated on 07/22/2021.  Patient completed extensive course of empiric antibiotics.   ?-SpO2: 100 % ?O2 Flow Rate (L/min): 0 L/min ?FiO2 (%): 40 %; Oxygen now weaned off, on room air with sats of 100%. ?-Continue Aspiration Precautions ? ?Acute pulmonary embolism (HCC) ?-CTA chest showed bilateral segmental PE.  ?-LE [place] negative for DVT.   ?-TTE with normal LV systolic function.  Supplemental oxygen weaned off. ?-Continue Eliquis 5 mg twice daily.   ?-Outpatient follow-up. ? ?Acute metabolic encephalopathy ?-CT head, MRI brain, EEG, TSH, B12, ammonia unrevealing.  Etiology likely multifactorial with acute respiratory failure secondary to aspiration pneumonia, bilateral pulmonary embolism.   ?-Completed course of antibiotics for aspiration pneumonia.  Seroquel and clonazepam discontinued.  Completed course of antibiotics for E. coli UTI/septicemia. ?-C/w Supportive care ? ?-Patient the night of 09/21/2021 noted to have some bouts of confusion. ?-Patient stated had a nightmare and that caused the confusion. ?-Patient alert and oriented to self place and time. ?-No further confusion noted. ?-Follow-up. ? ?E. coli bacteremia ?-Urine culture and blood cultures x2 08/17/2021 positive  for E. coli.  ?-Evaluated by Infectious Disease.   ?-Status post 7-day course of antibiotics per ID.  ? ?Severe sepsis due to recurrent aspiration pneumonia ?-Completed extensive course of antibiotics while under ICU care. S/p IR gtube  3/6. ?-Was receiving tube feeds nocturnally however held on 09/20/2021 as patient noted to have vomiting.  ?-Cleared for regular diet with thin liquids per SLP; but with very little oral intake per RN; and p

## 2021-09-24 DIAGNOSIS — R7989 Other specified abnormal findings of blood chemistry: Secondary | ICD-10-CM | POA: Diagnosis not present

## 2021-09-24 DIAGNOSIS — G9341 Metabolic encephalopathy: Secondary | ICD-10-CM | POA: Diagnosis not present

## 2021-09-24 DIAGNOSIS — I2601 Septic pulmonary embolism with acute cor pulmonale: Secondary | ICD-10-CM | POA: Diagnosis not present

## 2021-09-24 DIAGNOSIS — G959 Disease of spinal cord, unspecified: Secondary | ICD-10-CM | POA: Diagnosis not present

## 2021-09-24 LAB — GLUCOSE, CAPILLARY
Glucose-Capillary: 95 mg/dL (ref 70–99)
Glucose-Capillary: 99 mg/dL (ref 70–99)
Glucose-Capillary: 100 mg/dL — ABNORMAL HIGH (ref 70–99)
Glucose-Capillary: 113 mg/dL — ABNORMAL HIGH (ref 70–99)
Glucose-Capillary: 108 mg/dL — ABNORMAL HIGH (ref 70–99)
Glucose-Capillary: 102 mg/dL — ABNORMAL HIGH (ref 70–99)

## 2021-09-24 LAB — CBC
HCT: 30.9 % — ABNORMAL LOW (ref 39.0–52.0)
Hemoglobin: 10.1 g/dL — ABNORMAL LOW (ref 13.0–17.0)
MCH: 30.8 pg (ref 26.0–34.0)
MCHC: 32.7 g/dL (ref 30.0–36.0)
MCV: 94.2 fL (ref 80.0–100.0)
Platelets: 373 10*3/uL (ref 150–400)
RBC: 3.28 MIL/uL — ABNORMAL LOW (ref 4.22–5.81)
RDW: 16.3 % — ABNORMAL HIGH (ref 11.5–15.5)
WBC: 6.5 10*3/uL (ref 4.0–10.5)
nRBC: 0 % (ref 0.0–0.2)

## 2021-09-24 LAB — BASIC METABOLIC PANEL
Anion gap: 11 (ref 5–15)
BUN: 29 mg/dL — ABNORMAL HIGH (ref 6–20)
CO2: 25 mmol/L (ref 22–32)
Calcium: 10.4 mg/dL — ABNORMAL HIGH (ref 8.9–10.3)
Chloride: 99 mmol/L (ref 98–111)
Creatinine, Ser: 0.76 mg/dL (ref 0.61–1.24)
GFR, Estimated: >60 mL/min (ref >60)
Glucose, Bld: 97 mg/dL (ref 70–99)
Potassium: 4 mmol/L (ref 3.5–5.1)
Sodium: 135 mmol/L (ref 135–145)

## 2021-09-24 LAB — PHOSPHORUS: Phosphorus: 4.5 mg/dL (ref 2.5–4.6)

## 2021-09-24 LAB — MAGNESIUM: Magnesium: 2 mg/dL (ref 1.7–2.4)

## 2021-09-24 MED ORDER — BISACODYL 10 MG RE SUPP: 10.0000 mg | Freq: Once | RECTAL | Status: DC

## 2021-09-24 NOTE — Progress Notes (Signed)
Physical Therapy Treatment ?Patient Details ?Name: Brian [name] ?MRN: [id number] ?DOB: Mar 03, 1961 ?Today's Date: 09/24/2021 ? ? ?History of Present Illness Pt is a 61 y/o male admitted 06/29/21 following C3-7 ACDF after progressive cervical myelopathy symptoms including ataxia, bil UE and LE shaking and weakness, and loss of function in his hands with subsequent fall. Pt initially had improvement in neurological symptoms, but postoperative course was complicated by dysphasia and ataxia secondary to and epidural hematoma with evacuation of hematoma on 2/7. Course complicated by delirium secondary to encephalopathy, sepsis secondary to aspiration PNA, reintubated 2/19-2/26 for airway protection, bilat segmental PEs 2/18. PEG placed 3/6. PMHx includes tobacco and EtOH use disorder. ? ?  ?PT Comments  ? ? Pt received in recliner, alert and oriented to self/situation although restless and attempting to get out of recliner unassisted, pt agreeable to therapy session and very motivated to progress standing/gait tolerance. Pt performed seated UE/LE exercises with good tolerance and progressed to short household distance gait task with +1 modA and chair follow for safety. Pt with increased R lean and scissoring steps when turning, especially as fatigue increased. Pt needing consistent modA for stability upon standing with reciprocal sit<>stand transfers. Pt continues to benefit from PT services to progress toward functional mobility goals.    ?Recommendations for follow up therapy are one component of a multi-disciplinary discharge planning process, led by the attending physician.  Recommendations may be updated based on patient status, additional functional criteria and insurance authorization. ? ?Follow Up Recommendations ? Acute inpatient rehab (3hours/day) ?  ?  ?Assistance Recommended at Discharge Frequent or constant Supervision/Assistance  ?Patient can return home with the following Two people to help with walking  and/or transfers;A lot of help with bathing/dressing/bathroom;Assistance with cooking/housework;Assistance with feeding;Direct supervision/assist for medications management;Direct supervision/assist for financial management;Assist for transportation;Help with stairs or ramp for entrance ?  ?Equipment Recommendations ? Rolling walker (2 wheels);Wheelchair (measurements PT);Wheelchair cushion (measurements PT);Other (comment)  ?  ?Recommendations for Other Services   ? ? ?  ?Precautions / Restrictions Precautions ?Precautions: Fall ?Precaution Comments: previous G-tube site (pt pulled it out on evening of 4/29) ?Restrictions ?Weight Bearing Restrictions: No  ?  ? ?Mobility ? Bed Mobility ?Overal bed mobility: Needs Assistance ?Bed Mobility: Sit to Supine ?  ?  ?  ?Sit to supine: Min assist, HOB elevated ?  ?General bed mobility comments: cues for sequencing/LE positioning, increased time to perform, use of bed features. ?  ? ?Transfers ?Overall transfer level: Needs assistance ?Equipment used: None, Rolling walker (2 wheels) ?Transfers: Sit to/from Stand ?Sit to Stand: Mod assist ?  ?  ?  ?  ?  ?General transfer comment: from recliner chair>RW, then x5 reps x2 sets from EOB with HHA ?  ? ?Ambulation/Gait ?Ambulation/Gait assistance: Mod assist, +2 safety/equipment ?Gait Distance (Feet): 35 Feet (26ft, seated break, 2ft, seated break, 28ft) ?Assistive device: Rolling walker (2 wheels) ?Gait Pattern/deviations: Step-through pattern, Decreased step length - right, Decreased step length - left, Decreased stride length, Decreased dorsiflexion - right, Decreased dorsiflexion - left, Decreased weight shift to right, Decreased weight shift to left, Knee flexed in stance - right, Knee flexed in stance - left, Trunk flexed, Narrow base of support, Scissoring, Staggering left ?  ?  ?  ?General Gait Details: Pt. requiring increasing assist as he become fatigued. He demonstrates scissoring of feet while turning, but quad and  glute control decreased throughout the session requiring more assist to stay upright and notable L lean with fatigue; pt with  poor insight into fatigue/activity tolerance and only reporting minimal fatigue when shown RPE scale ? ? ? ?Modified Rankin (Stroke Patients Only) ?Modified Rankin (Stroke Patients Only) ?Pre-Morbid Rankin Score: No symptoms ?Modified Rankin: Moderately severe disability ? ? ?  ?Balance Overall balance assessment: Needs assistance ?Sitting-balance support: No upper extremity supported, Feet supported ?Sitting balance-Leahy Scale: Fair ?  ?Postural control: Left lateral lean, Posterior lean ?Standing balance support: Bilateral upper extremity supported ?Standing balance-Leahy Scale: Poor ?Standing balance comment: L lean throughout, more apparent while turning ?  ?  ?  ?  ?  ?  ?  ?  ?  ?  ?  ?  ? ?  ?Cognition Arousal/Alertness: Awake/alert ?Behavior During Therapy: Restless ?Overall Cognitive Status: Impaired/Different from baseline ?Area of Impairment: Attention, Memory, Safety/judgement, Awareness, Problem solving ?  ?  ?  ?  ?  ?  ?  ?  ?  ?Current Attention Level: Sustained ?Memory: Decreased short-term memory, Decreased recall of precautions ?Following Commands: Follows one step commands consistently ?Safety/Judgement: Decreased awareness of deficits, Decreased awareness of safety ?Awareness: Emergent ?Problem Solving: Difficulty sequencing, Requires verbal cues, Slow processing ?General Comments: Pt eager to mobilize and alert and oriented to situation today, wanting to work more with therapies and able to follow simple commands well. Decreased insight into his own activity tolerance and eager to keep moving although fatigue notably affecting his balance toward end of session ?  ?  ? ?  ?Exercises Other Exercises ?Other Exercises: STS x 5 reps x 2 sets from EOB without using RW (B HHA upon standing upright for safety, cues for controlled eccentric lowering) ?Other Exercises: seated BLE  AROM: LAQ, hip flexion x10 reps ea ?Other Exercises: seated BUE AROM: shoulder flexion, elbow flex/ext, D1 PNF Pattern (AAROM) x 10 reps ea ?Other Exercises: seated BUE A/AAROM: Rowing x10 reps ? ?  ?General Comments General comments (skin integrity, edema, etc.): HR 99-100 bpm and SpO2 100% on RA post-exertion ?  ?  ? ?Pertinent Vitals/Pain Pain Assessment ?Pain Assessment: No/denies pain ?Pain Intervention(s): Monitored during session, Repositioned  ? ? ? ?PT Goals (current goals can now be found in the care plan section) Acute Rehab PT Goals ?Patient Stated Goal: to walk more ?PT Goal Formulation: With patient ?Time For Goal Achievement: 10/01/21 ?Progress towards PT goals: Progressing toward goals ? ?  ?Frequency ? ? ? Min 5X/week ? ? ? ?  ?PT Plan Current plan remains appropriate  ? ? ?   ?AM-PAC PT "6 Clicks" Mobility   ?Outcome Measure ? Help needed turning from your back to your side while in a flat bed without using bedrails?: A Little ?Help needed moving from lying on your back to sitting on the side of a flat bed without using bedrails?: A Little ?Help needed moving to and from a bed to a chair (including a wheelchair)?: A Lot ?Help needed standing up from a chair using your arms (e.g., wheelchair or bedside chair)?: A Lot ?Help needed to walk in hospital room?: A Lot ?Help needed climbing 3-5 steps with a railing? : A Lot ?6 Click Score: 14 ? ?  ?End of Session Equipment Utilized During Treatment: Gait belt ?Activity Tolerance: Patient tolerated treatment well ?Patient left: in bed;with call bell/phone within reach;with bed alarm set ?Nurse Communication: Mobility status;Other (comment) (told NT he may want to sit up in chair for dinner) ?PT Visit Diagnosis: Muscle weakness (generalized) (M62.81);Other symptoms and signs involving the nervous system (R29.898);Difficulty in walking, not elsewhere classified (R26.2);Other  abnormalities of gait and mobility (R26.89) ?  ? ? ?Time: [id number] ?PT Time  Calculation (min) (ACUTE ONLY): 36 min ? ?Charges:  $Gait Training: 8-22 mins ?$Therapeutic Exercise: 8-22 mins          ?          ? ?Adelie Croswell P., PTA ?Acute Rehabilitation Services ?Secure Chat Preferred 9a-5:30pm ?O

## 2021-09-24 NOTE — Progress Notes (Signed)
? ?  Providing Compassionate, Quality Care - Together ? ?Brief hospital course: ?Patient status post C3-4, C4-5, C5-6, C6-7 anterior cervical discectomy with interbody fusion by Dr. Jordan Likes on 06/29/2021. Increased difficulty swallowing with lung atelectasis and elevated temperatures on 07/02/2021. Made NPO following MBS by SLP. 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 CTA head and neck negative for stroke. MRI revealed epidural hematoma. Patient underwent exploration of his cervical fusion with evacuation of the epidural hematoma on 07/03/2021. Cortrak was placed on 07/04/2021. Patient's strength much improved since epidural hematoma evacuation. Cortrak removed 07/06/2021 and dysphagia 2 diet with nectar thick liquids started. Patient with delirium vs ETOH withdrawal. Discontinued steroids 07/06/2021. CIWA protocol started and discontinued 07/07/2021. CT and MRI 07/07/2021 negative. Patient developed respiratory distress, requiring intubation on 07/15/2021. Work up revealed aspiration PNA and PE. Patient was extubated on 07/22/2021. Foley catheter removed 07/24/2021. Patient transferred to 3W on 07/26/2021. Foley replaced 07/28/2021. PEG placed 07/30/2021. Foley removed 08/09/2021. PEG pulled out by patient 08/16/2021. Delirium worsening 08/17/2021. Haldol administered. IR replaced PEG on 08/17/2021. Patient with urosepsis 08/18/2021. Culture grew E. Coli. C/o LBP 08/28/2021. Lumbar MRI demonstrated chronic degenerative changes with spinal stenosis at L3-4, L4-5, and L5-S1. Patient suffered a fall overnight 08/29/2021 without injury. Worsening muscle spasms 09/11/2021, baclofen started. Patient transitioned to regular diet 09/12/2021. Episode of emesis overnight 4/19-4/20. No further episodes of emesis since 4/20. KUB 4/20 just demonstrated a nonobstructive bowel gas pattern. Fall without injury reported 09/16/2021. Foley catheter removed again. Another fall 09/19/2021. No injury per report, but per  day nurse c/o left lower back pain. Episode of emesis in the morning on 09/20/2021. PEG pulled out 09/21/2021. ? ?Subjective: ?Patient reports he had a nightmare and pulled out his PEG 09/21/2021. He feels good and tells me he's ready to work with therapies today. ? ?Objective: ?Vital signs in last 24 hours: ?Temp:  [97.9 ?F (36.6 ?C)-98.9 ?F (37.2 ?C)] 98.1 ?F (36.7 ?C) (05/01 0801) ?Pulse Rate:  [99-115] 108 (05/01 0954) ?Resp:  [16-20] 20 (05/01 0801) ?BP: (110-134)/(78-92) 112/84 (05/01 8841) ?SpO2:  [100 %] 100 % (05/01 0801) ? ?Intake/Output from previous day: ?04/30 0701 - 05/01 0700 ?In: -  ?Out: 1400 [Urine:1400] ?Intake/Output this shift: ?Total I/O ?In: 120 [P.O.:120] ?Out: 550 [Urine:550] ? ?Alert; oriented to person, place, and time ?MAE, Generalized weakness ?Decreased fine motor BUE ?Incision is well-healed ? ?Lab Results: ?Recent Labs  ?  09/24/21 ?0018  ?WBC 6.5  ?HGB 10.1*  ?HCT 30.9*  ?PLT 373  ? ?BMET ?Recent Labs  ?  09/23/21 ?0141 09/24/21 ?0018  ?NA 134* 135  ?K 4.2 4.0  ?CL 103 99  ?CO2 24 25  ?GLUCOSE 105* 97  ?BUN 32* 29*  ?CREATININE 0.80 0.76  ?CALCIUM 10.0 10.4*  ? ? ?Studies/Results: ?No results found. ? ?Assessment/Plan: ?Patient is doing well after a very complicated hospital course following four level ACDF. He would like to discharge home. Continue efforts at mobilization. Plan is to leave PEG out and encourage PO intake. ?  ? LOS: 87 days  ? ? ? ?Val Eagle, DNP, AGNP-C ?Nurse Practitioner ? ? [facility] ?[address], [place], [place] [address] ?P: [phone]    F: [phone] ? ?09/24/2021, 12:31 PM ? ? ? ? ?

## 2021-09-24 NOTE — Progress Notes (Signed)
Patient states he had a normal bowel movement last night on 4/30. No documentation noted in chart. He is refusing suppository. Dr. Janee Morn  notified and suppository on hold for now.  ?

## 2021-09-24 NOTE — Progress Notes (Signed)
?PROGRESS NOTE/consult ? ? ? ?Brian [name]  [id number] DOB: 1960-08-27 DOA: 06/29/2021 ?PCP: Pcp, No  ? ? ?No chief complaint on file. ? ? 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  He was admitted on 2/3 and underwent ACDF of C3-4, C4-5, C5-5, and C6-7.  Post-operatively he was progressing slowly with residual weakness in both hands but improving lower extremity strength and function.  He developed some difficulty swallowing on 2/5 and SLP ordered and placed on thickened liquids.   Overnight 2/6, patient with increased difficulty swallowing and was made NPO but also noted to have dysarthria and difficulty moving extremities with numbness.  SLP evaluated and found to be moderate aspiration risk.   Also progressively tachycardic, tachypneic, and now febrile.  Code stroke activated and taken for CT/ CTA head and neck and was given decadron 10mg  once.  NIHSS 18.  CTH was negative for acute findings, and CTA head neck did not reveal any LVO but noted significant prevertebral soft tissue swelling with foci of gas present at the ventral epidural space at C4-5, small collection not excluded. On 2/7, patient with worsening confusion and concern for airway involvement, PCCM consulted and remained in the intensive care unit until he is transferred to the floor on 3/2 and PCCM requested transition to [facility] for medical assistance while patient remains under the neurosurgery service. ? ?Significant Hospital Events: ?2/3 ACDF C3-4, C4-5, C5-5, and C6-7 w/ Dr. Annette Stable ?2/6 SLP eval for difficulty swallowing ?2/7 Code stroke overnight, neg for LVO, CT showing soft tissue swelling.  PCCM  consulted for concern of airway management and AMS.  Went for evacuation of hematoma  ?2/18 CT Abd/Pelvis: showing bilateral segmental Pes, started on heparin and showing worsening pneumonia, febrile to  103.  PCT rising, 34.6, restarted on abx ?2/19 possible aspiration w/ severe hypoxia; intubated; Switching from heparin to angiomax given subtherapeutic levels despite increase in rate. ?2/20 Echo shows normal LV systolic function. There was notation of a + McConnell's sign c/w a large pulmonary embolus which is consistent with the finding of bilateral segmental pulmonary emboli noted on CT 07/14/2021. ?2/21 rash appreciated on back; stopped cefepim/vanc switched to zosyn; required low dose levo with increase in fentanyl ?2/22 remains intubated; unable to extubate to do increase RR and thick secretions ?2/23 Ketamine infusion added ?2/24: weaning fentanyl.  ?2/26 extubated ?2/28 tachycardia persists ?3/2: Modified barium swallow, continues n.p.o. with core track in place, likely will need PEG tube. ?3/6: s/p IR G-tube placement ?3/16: Foley catheter discontinued ?3/24: E. coli UTI/bacteremia>> cefazolin x7 days per ID ?4/5-4/6 Had a fall overnight and fell out of the bed and was unwitnessed but patient states he rolled out of the bed and did not get hurt. Has fall precautions now. ?4/92fell while trying to reach for his eyeglasses, reports landed on left knee, reports it is a soft fall, denies left knee pain, denies hit his head ?4/14 urine is cloudy, repeat urine culture + Citrobacter Freundii that is Sensitive to Bactrim,  ?4/16 reinserted foley due to recurrent urinary retention  ?medically clear awaiting SNF placement; difficult to place per TOC. ?4/19-4/20 Vomited overnight; is recently started on regular diet with thin liquids and has  continuous tube feeds were transitioned to nightly only.  Vomited about 350 cc of tube feed material early this morning.  As needed Zofran given and his TF were stopped.  ?4/21 No  Nausea overnight and KUB yesterday and showed "Nonobstructive bowel gas pattern. Moderate amount of oral contrast material throughout the colon." He was started back on IVF and will now stop after 12 hours given improvement in Nausea and Vomiting ?4/22 - He is feeling well and tolerating diet. Having some hand cramping. Continuing Rehabilitative efforts. ?4/23 - Exercising in the bed and wanting to sit in the chair. Continues to have some Hand Cramping. Had an unwitnessed fall without loss of consciousness or hitting head.  ?  ? ? ?Assessment & Plan: ? Principal Problem: ?  Cervical myelopathy ([place]); severe cervical spinal stenosis with cord compression s/p ACDF [id number] on 2/3, complicated by epidural hematoma s/p evacuation 2/7 ?Active Problems: ?  Acute respiratory failure with hypoxia ([place]) ?  Acute pulmonary embolism ([place]) ?  Acute metabolic encephalopathy ?  Severe sepsis due to recurrent aspiration pneumonia ?  E. coli bacteremia ?  Sinus tachycardia ?  Elevated liver enzymes ?  Hyponatremia ?  Tobacco use disorder ?  Physical deconditioning ?  Urinary retention ?  Protein-calorie malnutrition, severe ([place]) ?  Lumbar back pain ?  Fall during current hospitalization ?  Normocytic anemia ?  Thrombocytosis ?  Citrobacter infection ?  Vomiting ?  Abnormal LFTs ?  Aspiration pneumonia ([place]) ? ? ? ?Assessment and Plan: ?* Cervical myelopathy (HCC); severe cervical spinal stenosis with cord compression s/p ACDF [id number] on 2/3, complicated by epidural hematoma s/p evacuation 2/7 ?-Patient initially presented s/p recent fall with progressive upper and lower extremity weakness and tremors, He was found to have critical multilevel cervical spinal stenosis @ C3-6 with spinal cord compression and spinal cord signal change and underwent ACDF C3-4, C4-5, C5-5, and C6/7 by neurosurgery Dr. Trenton Gammon on 06/29/2021.  Postoperative complicated by epidural hematoma s/p evacuation on 2/7. ?-Neurosurgery feels he is doing well after a  complicated Hospital course following Four Level ACDF ?-Continue PT/OT/SLP efforts ?-Awaiting SNF placement per [facility] ?-Per neurosurgery.  ? ?Acute respiratory failure with hypoxia ([place]) ?-Likely Multifactorial with significant dysphagia and recurrent aspiration pneumonia events during initial hospitalization following ACDF surgery with postoperative complications of postoperative cervical hematoma.   ?-Patient did require ventilatory support in the intensive care unit and was successfully extubated on 07/22/2021.  Patient completed extensive course of empiric antibiotics.   ?-SpO2: 100 % ?O2 Flow Rate (L/min): 0 L/min ?FiO2 (%): 40 %; Oxygen now weaned off, on room air with sats of 100%. ?-Continue Aspiration Precautions ? ?Acute pulmonary embolism ([place]) ?-CTA chest showed bilateral segmental PE.  ?-LE [place] negative for DVT.   ?-TTE with normal LV systolic function.  Supplemental oxygen weaned off. ?-Continue Eliquis 5 mg twice daily.   ?-Outpatient follow-up. ? ?Acute metabolic encephalopathy ?-CT head, MRI brain, EEG, TSH, B12, ammonia unrevealing.  Etiology likely multifactorial with acute respiratory failure secondary to aspiration pneumonia, bilateral pulmonary embolism.   ?-Completed course of antibiotics for aspiration pneumonia.  Seroquel and clonazepam discontinued.  Completed course of antibiotics for E. coli UTI/septicemia. ?-C/w Supportive care ? ?-Patient the night of 09/21/2021 noted to have some bouts of confusion. ?-Patient stated had a nightmare and that caused the confusion. ?-Patient alert and oriented to self place and time. ?-No further confusion noted. ?-Follow-up. ? ?E. coli bacteremia ?-Urine culture and blood cultures x2 08/17/2021 positive for E.  coli.  ?-Evaluated by Infectious Disease.   ?-Status post 7-day course of antibiotics per ID.  ? ?Severe sepsis due to recurrent aspiration pneumonia ?-Completed extensive course of antibiotics while under ICU care. S/p IR gtube 3/6. ?-Was receiving tube  feeds nocturnally however held on 09/20/2021 as patient noted to have vomiting.  ?-Cleared for regular diet with thin liquids per SLP; but with very little oral intake per RN; and patient with little desire

## 2021-09-24 NOTE — Progress Notes (Signed)
Calorie Count Note ? ?48 hour calorie count ordered to start 4/29. ? ?Pt pulled out his PEG 4/28 while having a nightmare. Calorie count requested to determine if pt would be able to meet his needs without the nutrition provided by TF. Palliative care met with pt this AM and pt does not wish for PEG to be replaced. However, intake is inadequate. Pt is asking his family to bring in food for him as he does not like the items available to him at the hospital. Also is declining the Ensure Plus high protein and instead drinking boost plus which his family is supplying. Will ask dining services to work with pt to find acceptable food items while he remains inpatient.  ? ?Diet: Regular ?Supplements: Ensure Enlive TID ? ?Estimated Nutritional Needs:  ?Kcal:  2100-2300 ?Protein:  105-125 grams ?Fluid:  >2L/day ? ?4/29 Breakfast: 100 kcal, 0g of protein ?4/29 Lunch: 90kcal, 5g of protein ?4/29 Dinner: 270 kcal, 9g of protein ?Supplements: 3 Boost Plus (1080 kcal, 48g of protein) ? ?Total intake 4/29: ?1540 kcal (73% of minimum estimated needs)  ?62 protein (59% of minimum estimated needs) ? ?4/30 Breakfast: 409 kcal, 14 g of protein ?4/30 Lunch: No recorded intake ?4/30 Dinner: 315, 15g of protein ?Supplements: 3 Boost Plus (1080 kcal, 48g of protein) ? ?Total intake: ?1804 kcal (86% of minimum estimated needs)  ?77 protein (73% of minimum estimated needs) ? ?NUTRITION DIAGNOSIS:  ?Severe Malnutrition related to social / environmental circumstances as evidenced by severe muscle depletion, severe fat depletion.  ?  ?GOAL:  ?Patient will meet greater than or equal to 90% of their needs  ? ?INTERVENTION:  ?Continue current diet as ordered, encourage PO intake ?Will request dining services work with pt to select foods that are appetizing to him ? ?Ranell Patrick, RD, LDN ?Clinical Dietitian ?RD pager # available in [place]  ?After hours/weekend pager # available in [place] ?

## 2021-09-25 DIAGNOSIS — R7989 Other specified abnormal findings of blood chemistry: Secondary | ICD-10-CM | POA: Diagnosis not present

## 2021-09-25 DIAGNOSIS — I2601 Septic pulmonary embolism with acute cor pulmonale: Secondary | ICD-10-CM | POA: Diagnosis not present

## 2021-09-25 DIAGNOSIS — G9341 Metabolic encephalopathy: Secondary | ICD-10-CM | POA: Diagnosis not present

## 2021-09-25 DIAGNOSIS — G959 Disease of spinal cord, unspecified: Secondary | ICD-10-CM | POA: Diagnosis not present

## 2021-09-25 LAB — GLUCOSE, CAPILLARY
Glucose-Capillary: 90 mg/dL (ref 70–99)
Glucose-Capillary: 94 mg/dL (ref 70–99)
Glucose-Capillary: 93 mg/dL (ref 70–99)
Glucose-Capillary: 98 mg/dL (ref 70–99)
Glucose-Capillary: 88 mg/dL (ref 70–99)
Glucose-Capillary: 106 mg/dL — ABNORMAL HIGH (ref 70–99)

## 2021-09-25 NOTE — Progress Notes (Signed)
Nutrition Follow-up ? ?DOCUMENTATION CODES:  ?Severe malnutrition in context of social or environmental circumstances, Underweight ? ?INTERVENTION:  ?Continue current diet as ordered, encourage PO intake ?Will inquire with pharmacy about the possibility of ordering boost plus for pt ?Continue vitamin regimen ? ?NUTRITION DIAGNOSIS:  ?Severe Malnutrition related to social / environmental circumstances as evidenced by severe muscle depletion, severe fat depletion.  ?- ongoing  ? ?GOAL:  ?Patient will meet greater than or equal to 90% of their needs  ?- progressing, increasing oral intake ? ?MONITOR:  ?TF tolerance ? ?REASON FOR ASSESSMENT:  ?Consult ?Assessment of nutrition requirement/status, Enteral/tube feeding initiation and management ? ?ASSESSMENT:  ?Pt with hx EtOH abuse, tobacco use, and spinal stenosis initially presented 2/3 for planned multilevel anterior cervical decompression and fusion surgery after experiencing progressive bilateral upper and lower extremity weakness and spasticity due to critical multilevel cervical spinal stenosis with spinal cord compression and signal change. ? ?2/3 - s/p anterior cervical discectomy with interbody fusion  ?2/5 - pt initially complained of worsening swallowing function ?2/6 - MBS, NPO per SLP ?2/7 - transferred to ICU, s/p re-exploration anterior cervical fusion with evacuation of epidural hematoma ?2/8 - Cortrak tube placed (tip gastric), tube feeds initiated ?2/10 - MBS, diet advanced to dysphagia 2 with nectar-thick liquids, Cortrak removed ?2/19- Intubated after desaturation, possibly from PO intake ?2/20 - s/p cortrak tube; post pyloric  ?2/26 - Extubated ?3/02 - failed MBS ?3/6 - PEG placed ?3/23 - diet advanced to dysphagia 2 with nectar thick liquids s/p MBSS ?3/24 - PEG replaced ?4/28 - Pt removed PEG ? ?Pt resting in bedside chair at the time of assessment eating lunch. Pt removed his PEG during a nightmare 4/28 and pt does not wish for PEG to be replaced.  Wants to eat but does not like the food he is receiving. Adjust diet order to order assist yesterday and pt said someone form dining services came and helped him order his lunch, which he was very appreciative of. Was previously receiving house trays.  ? ?Confirmed with pt that he was not drinking ensure, was drinking boost plus but now has premier protein as he ran out of boost. Discussed that although the premier protein had more protein, they are lower in kcal and he needs both to maintain his weight. Will check with pharmacy about the possibility of ordering boost plus for pt as they are available at other facilities. Pt prefers chocolate. Pt hopeful today that he can get stronger and get home soon. ? ?Nutritionally Relevant Medications: ?Scheduled Meds: ?Scheduled Meds: ? baclofen  20 mg Oral TID  ? bisacodyl  10 mg Rectal Once  ? Ensure Enlive  237 mL Oral TID BM  ? folic acid  1 mg Oral Daily  ? multivitamin with minerals  1 tablet Oral Daily  ? polyethylene glycol  17 g Oral BID  ? sennosides  5 mL Oral BID  ? thiamine  100 mg Oral Daily  ? ?PRN Meds: bisacodyl, diphenhydrAMINE, sennosides **AND** docusate, ondansetron, sodium phosphate ? ?Labs Reviewed ? ?Diet Order:   ?Diet Order   ? ?       ?  Diet regular Room service appropriate? Yes; Fluid consistency: Thin  Diet effective now       ?  ? ?  ?  ? ?  ? ?EDUCATION NEEDS:  ?Education needs have been addressed ? ?Skin:  Skin Assessment: Skin Integrity Issues: ?Skin Integrity Issues:: Other (Comment) ?Other: MASD buttocks ? ?Last BM:  4/30 ? ?Height:  ?Ht Readings from Last 1 Encounters:  ?07/15/21 5\' 10"  (1.778 m)  ? ?Weight:  ?Wt Readings from Last 1 Encounters:  ?09/23/21 53.4 kg  ? ?Ideal Body Weight:  78.2 kg ? ?BMI:  Body mass index is 16.89 kg/m?. ? ?Estimated Nutritional Needs:  ?Kcal:  2100-2300 ?Protein:  105-125 grams ?Fluid:  >2L/day ? ? ?Brian [name], RD, LDN ?Clinical Dietitian ?RD pager # available in [place]  ?After hours/weekend pager #  available in [place] ?

## 2021-09-25 NOTE — Progress Notes (Signed)
Inpatient Rehab Admissions Coordinator:  ? ? Case discussed with Rehab MD, Dr. Berline Chough at request of Dr. Jordan Likes. Unfortunately, Pt. Has no support at d/c and is unlikely to reach mod I after a stay on CIR. Additionally, pt. Has no CIR/SNF benefits with his insurance and would be required to self-pay up front for admission, which Pt. Previously  stated is not possible for him. PT/OT is seeing Pt. As often as possible, but is not currently part of [facility] program. CIR may continue to follow at a distance for changes in payor/support, but we are not able to offer a bed at this time.  ? ?Megan Salon, MS, CCC-SLP ?Rehab Admissions Coordinator  ?[phone] (celll) ?[phone] (office) ?  ?

## 2021-09-25 NOTE — Progress Notes (Signed)
Patient continues to progress reasonably well.  Continues to have some issues with spasticity and poor fine motor control in both hands.  Patient is ambulating with therapy.  No complaints of pain.  Tolerating a regular diet well.  Voiding well. ? ?Overall the patient is in a difficult situation.  I do not think that functionally he is able to thrive independently and he lives alone with minimal family support.  I think he would definitely benefit from short-term rehabilitation stay with increasing his activities and helping him master ADLs.  I will discuss things further with Dr. Richardson Dopp ?

## 2021-09-25 NOTE — Progress Notes (Signed)
?PROGRESS NOTE/consult ? ? ? ?Brian [name]  [id number] DOB: 1960-08-27 DOA: 06/29/2021 ?PCP: Pcp, No  ? ? ?No chief complaint on file. ? ? 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  He was admitted on 2/3 and underwent ACDF of C3-4, C4-5, C5-5, and C6-7.  Post-operatively he was progressing slowly with residual weakness in both hands but improving lower extremity strength and function.  He developed some difficulty swallowing on 2/5 and SLP ordered and placed on thickened liquids.   Overnight 2/6, patient with increased difficulty swallowing and was made NPO but also noted to have dysarthria and difficulty moving extremities with numbness.  SLP evaluated and found to be moderate aspiration risk.   Also progressively tachycardic, tachypneic, and now febrile.  Code stroke activated and taken for CT/ CTA head and neck and was given decadron 10mg  once.  NIHSS 18.  CTH was negative for acute findings, and CTA head neck did not reveal any LVO but noted significant prevertebral soft tissue swelling with foci of gas present at the ventral epidural space at C4-5, small collection not excluded. On 2/7, patient with worsening confusion and concern for airway involvement, PCCM consulted and remained in the intensive care unit until he is transferred to the floor on 3/2 and PCCM requested transition to [facility] for medical assistance while patient remains under the neurosurgery service. ? ?Significant Hospital Events: ?2/3 ACDF C3-4, C4-5, C5-5, and C6-7 w/ Dr. Annette Stable ?2/6 SLP eval for difficulty swallowing ?2/7 Code stroke overnight, neg for LVO, CT showing soft tissue swelling.  PCCM  consulted for concern of airway management and AMS.  Went for evacuation of hematoma  ?2/18 CT Abd/Pelvis: showing bilateral segmental Pes, started on heparin and showing worsening pneumonia, febrile to  103.  PCT rising, 34.6, restarted on abx ?2/19 possible aspiration w/ severe hypoxia; intubated; Switching from heparin to angiomax given subtherapeutic levels despite increase in rate. ?2/20 Echo shows normal LV systolic function. There was notation of a + McConnell's sign c/w a large pulmonary embolus which is consistent with the finding of bilateral segmental pulmonary emboli noted on CT 07/14/2021. ?2/21 rash appreciated on back; stopped cefepim/vanc switched to zosyn; required low dose levo with increase in fentanyl ?2/22 remains intubated; unable to extubate to do increase RR and thick secretions ?2/23 Ketamine infusion added ?2/24: weaning fentanyl.  ?2/26 extubated ?2/28 tachycardia persists ?3/2: Modified barium swallow, continues n.p.o. with core track in place, likely will need PEG tube. ?3/6: s/p IR G-tube placement ?3/16: Foley catheter discontinued ?3/24: E. coli UTI/bacteremia>> cefazolin x7 days per ID ?4/5-4/6 Had a fall overnight and fell out of the bed and was unwitnessed but patient states he rolled out of the bed and did not get hurt. Has fall precautions now. ?4/92fell while trying to reach for his eyeglasses, reports landed on left knee, reports it is a soft fall, denies left knee pain, denies hit his head ?4/14 urine is cloudy, repeat urine culture + Citrobacter Freundii that is Sensitive to Bactrim,  ?4/16 reinserted foley due to recurrent urinary retention  ?medically clear awaiting SNF placement; difficult to place per TOC. ?4/19-4/20 Vomited overnight; is recently started on regular diet with thin liquids and has  continuous tube feeds were transitioned to nightly only.  Vomited about 350 cc of tube feed material early this morning.  As needed Zofran given and his TF were stopped.  ?4/21 No  Nausea overnight and KUB yesterday and showed "Nonobstructive bowel gas pattern. Moderate amount of oral contrast material throughout the colon." He was started back on IVF and will now stop after 12 hours given improvement in Nausea and Vomiting ?4/22 - He is feeling well and tolerating diet. Having some hand cramping. Continuing Rehabilitative efforts. ?4/23 - Exercising in the bed and wanting to sit in the chair. Continues to have some Hand Cramping. Had an unwitnessed fall without loss of consciousness or hitting head.  ?  ? ? ?Assessment & Plan: ? Principal Problem: ?  Cervical myelopathy (HCC); severe cervical spinal stenosis with cord compression s/p ACDF C3-7 on 2/3, complicated by epidural hematoma s/p evacuation 2/7 ?Active Problems: ?  Acute respiratory failure with hypoxia (HCC) ?  Acute pulmonary embolism (HCC) ?  Acute metabolic encephalopathy ?  Severe sepsis due to recurrent aspiration pneumonia ?  E. coli bacteremia ?  Sinus tachycardia ?  Elevated liver enzymes ?  Hyponatremia ?  Tobacco use disorder ?  Physical deconditioning ?  Urinary retention ?  Protein-calorie malnutrition, severe (HCC) ?  Lumbar back pain ?  Fall during current hospitalization ?  Normocytic anemia ?  Thrombocytosis ?  Citrobacter infection ?  Vomiting ?  Abnormal LFTs ?  Aspiration pneumonia (HCC) ? ? ? ?Assessment and Plan: ?* Cervical myelopathy (HCC); severe cervical spinal stenosis with cord compression s/p ACDF C3-7 on 2/3, complicated by epidural hematoma s/p evacuation 2/7 ?-Patient initially presented s/p recent fall with progressive upper and lower extremity weakness and tremors, He was found to have critical multilevel cervical spinal stenosis @ C3-6 with spinal cord compression and spinal cord signal change and underwent ACDF C3-4, C4-5, C5-5, and C6/7 by neurosurgery Dr. Dutch Quint on 06/29/2021.  Postoperative complicated by epidural hematoma s/p evacuation on 2/7. ?-Neurosurgery feels he is doing well after a  complicated Hospital course following Four Level ACDF ?-Continue PT/OT/SLP efforts ?-Awaiting SNF placement per [facility] ?-Per neurosurgery.  ? ?Acute respiratory failure with hypoxia (HCC) ?-Likely Multifactorial with significant dysphagia and recurrent aspiration pneumonia events during initial hospitalization following ACDF surgery with postoperative complications of postoperative cervical hematoma.   ?-Patient did require ventilatory support in the intensive care unit and was successfully extubated on 07/22/2021.  Patient completed extensive course of empiric antibiotics.   ?-SpO2: 100 % ?O2 Flow Rate (L/min): 0 L/min ?FiO2 (%): 40 %; Oxygen now weaned off, on room air with sats of 100%. ?-Continue Aspiration Precautions ? ?Acute pulmonary embolism (HCC) ?-CTA chest showed bilateral segmental PE.  ?-LE [place] negative for DVT.   ?-TTE with normal LV systolic function.  Supplemental oxygen weaned off. ?-Continue Eliquis 5 mg twice daily.   ?-Outpatient follow-up. ? ?Acute metabolic encephalopathy ?-CT head, MRI brain, EEG, TSH, B12, ammonia unrevealing.  Etiology likely multifactorial with acute respiratory failure secondary to aspiration pneumonia, bilateral pulmonary embolism.   ?-Completed course of antibiotics for aspiration pneumonia.  Seroquel and clonazepam discontinued.  Completed course of antibiotics for E. coli UTI/septicemia. ?-C/w Supportive care ? ?-Patient the night of 09/21/2021 noted to have some bouts of confusion. ?-Patient stated had a nightmare and that caused the confusion. ?-Patient alert and oriented to self place and time. ?-No further confusion noted. ?-Outpatient follow-up. ? ?E. coli bacteremia ?-Urine culture and blood cultures x2 08/17/2021 positive for  E. coli.  ?-Evaluated by Infectious Disease.   ?-Status post 7-day course of antibiotics per ID.  ? ?Severe sepsis due to recurrent aspiration pneumonia ?-Completed extensive course of antibiotics while under ICU care. S/p IR gtube 3/6. ?-Was  receiving tube feeds nocturnally however held on 09/20/2021 as patient noted to have vomiting.  ?-Cleared for regular diet with thin liquids per SLP; but with very little oral intake per RN; and patient with li

## 2021-09-25 NOTE — Progress Notes (Signed)
Occupational Therapy Treatment ?Patient Details ?Name: Brian [name] ?MRN: [id number] ?DOB: December 15, 1960 ?Today's Date: 09/25/2021 ? ? ?History of present illness Pt is a 61 y/o male admitted 06/29/21 following C3-7 ACDF after progressive cervical myelopathy symptoms including ataxia, bil UE and LE shaking and weakness, and loss of function in his hands with subsequent fall. Pt initially had improvement in neurological symptoms, but postoperative course was complicated by dysphasia and ataxia secondary to and epidural hematoma with evacuation of hematoma on 2/7. Course complicated by delirium secondary to encephalopathy, sepsis secondary to aspiration PNA, reintubated 2/19-2/26 for airway protection, bilat segmental PEs 2/18. PEG placed 3/6. PMHx includes tobacco and EtOH use disorder. ?  ?OT comments ? Patient making good progress with OT treatment. Patient performed dressing seated in recliner with min assist for pullover top, mod assist for pants, and max assist for shoes. Patient performed grooming standing at sink with sink for support with LUE and brushing teeth with RUE with built up toothbrush handle. Patient had LOB while standing but was able to correct with mod assist. Acute OT to continue to follow.   ? ?Recommendations for follow up therapy are one component of a multi-disciplinary discharge planning process, led by the attending physician.  Recommendations may be updated based on patient status, additional functional criteria and insurance authorization. ?   ?Follow Up Recommendations ? Skilled nursing-short term rehab (<3 hours/day)  ?  ?Assistance Recommended at Discharge Frequent or constant Supervision/Assistance  ?Patient can return home with the following ? Two people to help with walking and/or transfers;Direct supervision/assist for medications management;Assist for transportation;Help with stairs or ramp for entrance;Assistance with feeding;Two people to help with  bathing/dressing/bathroom;Assistance with cooking/housework;Direct supervision/assist for financial management ?  ?Equipment Recommendations ? Wheelchair (measurements OT);Wheelchair cushion (measurements OT);Hospital bed;BSC/3in1;Tub/shower seat  ?  ?Recommendations for Other Services   ? ?  ?Precautions / Restrictions Precautions ?Precautions: Fall  ? ? ?  ? ?Mobility Bed Mobility ?Overal bed mobility: Needs Assistance ?  ?  ?  ?  ?  ?  ?General bed mobility comments: up in recliner upon entry ?  ? ?Transfers ?Overall transfer level: Needs assistance ?Equipment used: Rolling walker (2 wheels) ?Transfers: Sit to/from Stand ?Sit to Stand: Min guard ?  ?  ?Step pivot transfers: Mod assist ?  ?  ?General transfer comment: required mod assist to steady when turning ?  ?  ?Balance Overall balance assessment: Needs assistance ?Sitting-balance support: No upper extremity supported, Feet supported ?Sitting balance-Leahy Scale: Fair ?Sitting balance - Comments: up in recliner upon entry ?Postural control: Posterior lean ?Standing balance support: Single extremity supported, Bilateral upper extremity supported, During functional activity ?Standing balance-Leahy Scale: Poor ?Standing balance comment: stood at sink to perform grooming task with sink as support. Patient had LOB while standing and required mod assist to correct. ?  ?  ?  ?  ?  ?  ?  ?  ?  ?  ?  ?   ? ?ADL either performed or assessed with clinical judgement  ? ?ADL Overall ADL's : Needs assistance/impaired ?  ?  ?Grooming: Wash/dry face;Oral care;Minimal assistance;Standing ?Grooming Details (indicate cue type and reason): stood at sink wtih assistance for balance and min assist for grooming ?  ?  ?  ?  ?Upper Body Dressing : Minimal assistance;Sitting ?Upper Body Dressing Details (indicate cue type and reason): donned pullover top ?Lower Body Dressing: Moderate assistance;Maximal assistance;Sit to/from stand ?Lower Body Dressing Details (indicate cue type and  reason): mod assist for  donning pants and max assist for shoes ?  ?  ?  ?  ?  ?  ?  ?General ADL Comments: performed dressing seated in recliner and stood to pull up pants. Grooming standing at sink with one LOB requiring mod assist to correct ?  ? ?Extremity/Trunk Assessment   ?  ?  ?  ?  ?  ? ?Vision   ?  ?  ?Perception   ?  ?Praxis   ?  ? ?Cognition Arousal/Alertness: Awake/alert ?Behavior During Therapy: Restless ?Overall Cognitive Status: Within Functional Limits for tasks assessed ?  ?  ?  ?  ?  ?  ?  ?  ?  ?  ?  ?  ?  ?  ?  ?  ?General Comments: motivated to participate ?  ?  ?   ?Exercises   ? ?  ?Shoulder Instructions   ? ? ?  ?General Comments    ? ? ?Pertinent Vitals/ Pain       Pain Assessment ?Pain Assessment: Faces ?Faces Pain Scale: Hurts a little bit ?Pain Location: right inner thigh/groin ?Pain Descriptors / Indicators: Sore ?Pain Intervention(s): Monitored during session ? ?Home Living   ?  ?  ?  ?  ?  ?  ?  ?  ?  ?  ?  ?  ?  ?  ?  ?  ?  ?  ? ?  ?Prior Functioning/Environment    ?  ?  ?  ?   ? ?Frequency ? Min 2X/week  ? ? ? ? ?  ?Progress Toward Goals ? ?OT Goals(current goals can now be found in the care plan section) ? Progress towards OT goals: Progressing toward goals ? ?Acute Rehab OT Goals ?Patient Stated Goal: go home ?OT Goal Formulation: With patient ?Time For Goal Achievement: 10/05/21 ?Potential to Achieve Goals: Good ?ADL Goals ?Pt Will Perform Eating: with min assist;with assist to don/doff brace/orthosis;with adaptive utensils;sitting ?Pt Will Perform Grooming: sitting;with min assist ?Pt Will Perform Upper Body Bathing: with mod assist;sitting ?Pt Will Transfer to Toilet: with min assist;stand pivot transfer;bedside commode ?Pt/caregiver will Perform Home Exercise Program: Increased ROM;Increased strength;Both right and left upper extremity;With theraputty;With minimal assist;With written HEP provided ?Additional ADL Goal #1: Pt will grasp objects with R hand and bring to mouth  10/10 trials in prep for self feeding ?Additional ADL Goal #2: Pt will grasp/release 5/5 objects with L hand to increase indep with ADLs  ?Plan Discharge plan remains appropriate   ? ?Co-evaluation ? ? ?   ?  ?  ?  ?  ? ?  ?AM-PAC OT "6 Clicks" Daily Activity     ?Outcome Measure ? ? Help from another person eating meals?: A Lot ?Help from another person taking care of personal grooming?: A Lot ?Help from another person toileting, which includes using toliet, bedpan, or urinal?: A Lot ?Help from another person bathing (including washing, rinsing, drying)?: A Lot ?Help from another person to put on and taking off regular upper body clothing?: A Little ?Help from another person to put on and taking off regular lower body clothing?: A Lot ?6 Click Score: 13 ? ?  ?End of Session Equipment Utilized During Treatment: Gait belt;Rolling walker (2 wheels) ? ?OT Visit Diagnosis: Other abnormalities of gait and mobility (R26.89);Muscle weakness (generalized) (M62.81);Feeding difficulties (R63.3);Other symptoms and signs involving the nervous system (R29.898);Other symptoms and signs involving cognitive function ?  ?Activity Tolerance Patient tolerated treatment well ?  ?Patient Left in chair;with  call bell/phone within reach;with chair alarm set ?  ?Nurse Communication Mobility status ?  ? ?   ? ?Time: [id number] ?OT Time Calculation (min): 33 min ? ?Charges: OT General Charges ?$OT Visit: 1 Visit ?OT Treatments ?$Self Care/Home Management : 23-37 mins ? ?Lodema Hong, OTA ?Acute Rehabilitation Services  ?Pager [phone] ?Office [phone] ? ? ?[place] ?09/25/2021, 2:06 PM ?

## 2021-09-25 NOTE — Progress Notes (Signed)
Physical Therapy Treatment ?Patient Details ?Name: Brian [name] ?MRN: [id number] ?DOB: 08-01-1960 ?Today's Date: 09/25/2021 ? ? ?History of Present Illness Pt is a 61 y/o male admitted 06/29/21 following C3-7 ACDF after progressive cervical myelopathy symptoms including ataxia, bil UE and LE shaking and weakness, and loss of function in his hands with subsequent fall. Pt initially had improvement in neurological symptoms, but postoperative course was complicated by dysphasia and ataxia secondary to and epidural hematoma with evacuation of hematoma on 2/7. Course complicated by delirium secondary to encephalopathy, sepsis secondary to aspiration PNA, reintubated 2/19-2/26 for airway protection, bilat segmental PEs 2/18. PEG placed 3/6. PMHx includes tobacco and EtOH use disorder. ? ?  ?PT Comments  ? ? Progressing well toward goals.  Pt very motivated and eager to work.  Emphasis on resistance exercise for strengthening  and control, transitions, progression of gait stability/quality and stamina and work on safety/control of sit to stand to sits. ? ?  ?Recommendations for follow up therapy are one component of a multi-disciplinary discharge planning process, led by the attending physician.  Recommendations may be updated based on patient status, additional functional criteria and insurance authorization. ? ?Follow Up Recommendations ? Acute inpatient rehab (3hours/day) ?  ?  ?Assistance Recommended at Discharge Frequent or constant Supervision/Assistance  ?Patient can return home with the following A little help with walking and/or transfers;A little help with bathing/dressing/bathroom;Assistance with cooking/housework;Direct supervision/assist for medications management;Direct supervision/assist for financial management;Help with stairs or ramp for entrance;Assist for transportation ?  ?Equipment Recommendations ? Rolling walker (2 wheels);Wheelchair (measurements PT);Wheelchair cushion (measurements PT);Other  (comment)  ?  ?Recommendations for Other Services   ? ? ?  ?Precautions / Restrictions Precautions ?Precautions: Fall  ?  ? ?Mobility ? Bed Mobility ?Overal bed mobility: Needs Assistance ?Bed Mobility: Supine to Sit ?  ?Sidelying to sit: Supervision ?  ?  ?  ?General bed mobility comments: increased time, generally smooth transition. ?  ? ?Transfers ?Overall transfer level: Needs assistance ?Equipment used: Rolling walker (2 wheels) ?  ?Sit to Stand: Min guard ?  ?  ?  ?  ?  ?General transfer comment: reinforcing and practicing safe technique and soft/controlled descents ?  ? ?Ambulation/Gait ?Ambulation/Gait assistance: Min guard, +2 safety/equipment ?Gait Distance (Feet): 120 Feet (and 70 feet after sitting rest for muscle fatigue.) ?Assistive device: Rolling walker (2 wheels) ?Gait Pattern/deviations: Step-through pattern ?Gait velocity: decreased ?Gait velocity interpretation: <1.8 ft/sec, indicate of risk for recurrent falls ?  ?General Gait Details: pt able to stay upright and though mildly unsteady overall, kept an appropriate step width without scissoring. ? ? ?Stairs ?  ?  ?  ?  ?  ? ? ?Wheelchair Mobility ?  ? ?Modified Rankin (Stroke Patients Only) ?  ? ? ?  ?Balance Overall balance assessment: Needs assistance ?Sitting-balance support: No upper extremity supported, Feet supported ?Sitting balance-Leahy Scale: Fair ?Sitting balance - Comments: is still not confident reach far outside his BOS ?  ?Standing balance support: Bilateral upper extremity supported ?Standing balance-Leahy Scale: Poor ?Standing balance comment: reliant on UE assist and external support ?  ?  ?  ?  ?  ?  ?  ?  ?  ?  ?  ?  ? ?  ?Cognition Arousal/Alertness: Awake/alert ?Behavior During Therapy: Restless ?Overall Cognitive Status: Within Functional Limits for tasks assessed ?  ?  ?  ?  ?  ?  ?  ?  ?  ?  ?  ?  ?  ?  ?  ?  ?  ?  ?  ? ?  ?  Exercises General Exercises - Lower Extremity ?Ankle Circles/Pumps: AROM, Strengthening, Both, 15  reps, Supine ?Heel Slides: Both, 10 reps, AROM, Supine (resisted flex/ext) ?Hip ABduction/ADduction: Strengthening, Both, Seated, 10 reps (graded resistance ab/add) ?Straight Leg Raises: AROM, Strengthening, Both, 10 reps, Supine ?Other Exercises ?Other Exercises: bridges AROM x10 reps  working on control/symmetry ? ?  ?General Comments   ?  ?  ? ?Pertinent Vitals/Pain Pain Assessment ?Pain Assessment: Faces ?Faces Pain Scale: Hurts little more ?Pain Location: right inner thigh/groin ?Pain Descriptors / Indicators: Sore ?Pain Intervention(s): Monitored during session  ? ? ?Home Living   ?  ?  ?  ?  ?  ?  ?  ?  ?  ?   ?  ?Prior Function    ?  ?  ?   ? ?PT Goals (current goals can now be found in the care plan section) Acute Rehab PT Goals ?Patient Stated Goal: to walk more ?PT Goal Formulation: With patient ?Time For Goal Achievement: 10/01/21 ?Potential to Achieve Goals: Good ?Progress towards PT goals: Progressing toward goals ? ?  ?Frequency ? ? ? Min 5X/week ? ? ? ?  ?PT Plan Current plan remains appropriate  ? ? ?Co-evaluation   ?  ?  ?  ?  ? ?  ?AM-PAC PT "6 Clicks" Mobility   ?Outcome Measure ? Help needed turning from your back to your side while in a flat bed without using bedrails?: A Little ?Help needed moving from lying on your back to sitting on the side of a flat bed without using bedrails?: A Little ?Help needed moving to and from a bed to a chair (including a wheelchair)?: A Little ?Help needed standing up from a chair using your arms (e.g., wheelchair or bedside chair)?: A Little ?Help needed to walk in hospital room?: A Little ?Help needed climbing 3-5 steps with a railing? : A Lot ?6 Click Score: 17 ? ?  ?End of Session   ?Activity Tolerance: Patient tolerated treatment well ?Patient left: in chair;with call bell/phone within reach;with chair alarm set ?Nurse Communication: Mobility status ?PT Visit Diagnosis: Muscle weakness (generalized) (M62.81);Other symptoms and signs involving the nervous  system (R29.898) ?  ? ? ?Time: 9767-3419 ?PT Time Calculation (min) (ACUTE ONLY): 35 min ? ?Charges:  $Gait Training: 8-22 mins ?$Therapeutic Exercise: 8-22 mins          ?          ? ?09/25/2021 ? ?Jacinto Halim., PT ?Acute Rehabilitation Services ?[phone]  (pager) ?[phone]  (office) ? ? ?Eliseo Gum Brenleigh Collet ?09/25/2021, 12:02 PM ? ?

## 2021-09-26 DIAGNOSIS — G959 Disease of spinal cord, unspecified: Secondary | ICD-10-CM | POA: Diagnosis not present

## 2021-09-26 LAB — GLUCOSE, CAPILLARY
Glucose-Capillary: 100 mg/dL — ABNORMAL HIGH (ref 70–99)
Glucose-Capillary: 132 mg/dL — ABNORMAL HIGH (ref 70–99)
Glucose-Capillary: 96 mg/dL (ref 70–99)
Glucose-Capillary: 97 mg/dL (ref 70–99)
Glucose-Capillary: 98 mg/dL (ref 70–99)

## 2021-09-26 MED ORDER — BOOST PLUS PO LIQD
237.0000 mL | Freq: Three times a day (TID) | ORAL | Status: DC
  Administered 2021-09-26 – 2021-11-02 (×104): 237 mL via ORAL
  Filled 2021-09-26 (×120): qty 237

## 2021-09-26 NOTE — Progress Notes (Signed)
Speech Language Pathology Treatment: Dysphagia  ?Patient Details ?Name: Brian [name] ?MRN: [id number] ?DOB: [birth year] ?Today's Date: 09/26/2021 ?Time: 5374-8270 ?SLP Time Calculation (min) (ACUTE ONLY): 15 min ? ?Assessment / Plan / Recommendation ?Clinical Impression ? Brian [name] has made tremendous progress since this therapist began working with him 07/02/21! He had a total of four MBS since admission and progressed from NPO to regular texture/thin liquids. He is now able to feed himself and may need set up assist with tight lids on Ensure etc. He consumed blueberry muffin and thin Ensure with timely propulsion and no s/sx of aspiration. Vocal quality remained clear. Pt has met his dysphagia goals and has an EMST device which he was encouraged to continue using to maintain pharyngeal strength while continuing to recover. It has been a pleasure working with him and no further ST is warranted at this time. Continue regular/thin liquids and meds with thin. He reports he pulled his PEG out several weeks ago by accident while he was dreaming.  ?  ?HPI HPI: Pt is a 61 y/o admitted 06/29/21 following C3-7 ACDF after progressive cervical myelopathy symptoms with subsequent fall and severe incomplete SCI. Significant prevertebral swelling per imaging. Pt developed some difficulty swallowing on 2/5 and SLP consulted. MBS 2/6 revealed severe edema s/p ACDF, oral holding, a pharyngeal delay, inconsistent hyolaryngeal elevation, incomplete epiglottic inversion due to edema. Aspiration noted accross consistencies and and NPO status was recommended. Repeat MBS 2/10 showed significantly reduced pharyngeal edema and a dysphagia 2 diet with nectar thick liquids was initiated. Pt transitioned to thin liquids 2/16 and susbequently returned to nectar thick 2/17 due to concern for aspiration. Pt developed pna, possible aspiration, requiring intubation 2/19-26. Cortrak placed 2/20. PEG placed in IR on 3/6. No significant PMH. Pt is  appropriate for his 4th MBS to determine if he can upgrade to thin liquids. ?  ?   ?SLP Plan ? All goals met;Discharge SLP treatment due to (comment) ? ?  ?  ?Recommendations for follow up therapy are one component of a multi-disciplinary discharge planning process, led by the attending physician.  Recommendations may be updated based on patient status, additional functional criteria and insurance authorization. ?  ? ?Recommendations  ?Diet recommendations: Regular;Thin liquid ?Liquids provided via: Cup;Straw ?Medication Administration: Whole meds with liquid ?Supervision: Patient able to self feed (may need set up?) ?Compensations: Slow rate;Small sips/bites;Other (Comment) ?Postural Changes and/or Swallow Maneuvers: Seated upright 90 degrees  ?   ?    ?   ? ? ? ? Oral Care Recommendations: Oral care BID ?Follow Up Recommendations: No SLP follow up ?Assistance recommended at discharge: None ?SLP Visit Diagnosis: Dysphagia, pharyngeal phase (R13.13) ?Plan: All goals met;Discharge SLP treatment due to (comment) ? ? ? ? ?  ?  ? ? ?Brian [name] ? ?09/26/2021, 10:05 AM ?

## 2021-09-26 NOTE — Progress Notes (Signed)
Physical Therapy Treatment ?Patient Details ?Name: Brian [name] ?MRN: [id number] ?DOB: 11-Aug-1960 ?Today's Date: 09/26/2021 ? ? ?History of Present Illness Pt is a 61 y/o male admitted 06/29/21 following C3-7 ACDF after progressive cervical myelopathy symptoms including ataxia, bil UE and LE shaking and weakness, and loss of function in his hands with subsequent fall. Pt initially had improvement in neurological symptoms, but postoperative course was complicated by dysphasia and ataxia secondary to and epidural hematoma with evacuation of hematoma on 2/7. Course complicated by delirium secondary to encephalopathy, sepsis secondary to aspiration PNA, reintubated 2/19-2/26 for airway protection, bilat segmental PEs 2/18. PEG placed 3/6. PMHx includes tobacco and EtOH use disorder. ? ?  ?PT Comments  ? ? Pt was seen for mobility on RW today on hallway with dense repetitive cues used for shortening strides and managing tendency to scissor.  Pt can control better when he slows his pace, but requires reminding due to mild impulsive issues.  Pt is expecting to go to rehab, but the final plans are not yet in place.  Follow along 5x per week for progressing his control of these gait issues, and to reinforce the safety that more independent mobility will require.  CIR is recommended, although this may not be possible.  The patient will benefit from a more intensive therapy protocol, with multidisciplinary organization.    ?Recommendations for follow up therapy are one component of a multi-disciplinary discharge planning process, led by the attending physician.  Recommendations may be updated based on patient status, additional functional criteria and insurance authorization. ? ?Follow Up Recommendations ? Acute inpatient rehab (3hours/day) ?  ?  ?Assistance Recommended at Discharge Frequent or constant Supervision/Assistance  ?Patient can return home with the following A little help with walking and/or transfers;A little help  with bathing/dressing/bathroom;Assistance with cooking/housework;Assist for transportation;Help with stairs or ramp for entrance ?  ?Equipment Recommendations ? Rolling walker (2 wheels);Wheelchair (measurements PT);Wheelchair cushion (measurements PT)  ?  ?Recommendations for Other Services Rehab consult ? ? ?  ?Precautions / Restrictions Precautions ?Precautions: Fall ?Precaution Booklet Issued: No ?Precaution Comments: previous G-tube site (pt pulled it out on evening of 4/29) ?Required Braces or Orthoses:  (brace is discharged) ?Restrictions ?Weight Bearing Restrictions: No  ?  ? ?Mobility ? Bed Mobility ?  ?  ?  ?  ?  ?  ?  ?General bed mobility comments: up in chair when PT arrived ?  ? ?Transfers ?Overall transfer level: Needs assistance ?Equipment used: Rolling walker (2 wheels) ?Transfers: Sit to/from Stand ?Sit to Stand: Min assist ?  ?  ?  ?  ?  ?General transfer comment: pt is getting up to stand with UE support, requires both physical assist and cues for sequence ?  ? ?Ambulation/Gait ?Ambulation/Gait assistance: Min guard, Min assist ?Gait Distance (Feet): 140 Feet (sitting rest half way) ?Assistive device: Rolling walker (2 wheels) ?Gait Pattern/deviations: Step-through pattern, Scissoring ?Gait velocity: decreased ?Gait velocity interpretation: <1.31 ft/sec, indicative of household ambulator ?Pre-gait activities: standing balance correction ?General Gait Details: longer steps with pt attempting to scissor unless reminded to keep width of feet and slow down ? ? ?Stairs ?  ?  ?  ?  ?  ? ? ?Wheelchair Mobility ?  ? ?Modified Rankin (Stroke Patients Only) ?  ? ? ?  ?Balance Overall balance assessment: Needs assistance ?Sitting-balance support: Feet supported ?Sitting balance-Leahy Scale: Fair ?  ?  ?Standing balance support: Bilateral upper extremity supported, During functional activity ?Standing balance-Leahy Scale: Poor ?  ?  ?  ?  ?  ?  ?  ?  ?  ?  ?  ?  ?  ? ?  ?  Cognition Arousal/Alertness:  Awake/alert ?Behavior During Therapy: Restless, Impulsive ?Overall Cognitive Status: Within Functional Limits for tasks assessed ?Area of Impairment: Problem solving, Awareness, Safety/judgement, Following commands, Attention, Memory ?  ?  ?  ?  ?  ?  ?  ?  ?Orientation Level: Situation, Time ?Current Attention Level: Selective ?Memory: Decreased short-term memory, Decreased recall of precautions ?Following Commands: Follows one step commands with increased time, Follows one step commands inconsistently ?Safety/Judgement: Decreased awareness of deficits, Decreased awareness of safety ?Awareness: Emergent, Intellectual ?Problem Solving: Slow processing, Requires verbal cues, Difficulty sequencing ?  ?  ?  ? ?  ?Exercises   ? ?  ?General Comments General comments (skin integrity, edema, etc.): pt is up to walk with help, noted his tendency to overestimate his ability to walk for longer trips, but agreeable to PT setting limits for safety ?  ?  ? ?Pertinent Vitals/Pain Pain Assessment ?Pain Assessment: No/denies pain  ? ? ?Home Living   ?  ?  ?  ?  ?  ?  ?  ?  ?  ?   ?  ?Prior Function    ?  ?  ?   ? ?PT Goals (current goals can now be found in the care plan section) Acute Rehab PT Goals ?Patient Stated Goal: to walk more ?Progress towards PT goals: Progressing toward goals ? ?  ?Frequency ? ? ? Min 5X/week ? ? ? ?  ?PT Plan Current plan remains appropriate  ? ? ?Co-evaluation   ?  ?  ?  ?  ? ?  ?AM-PAC PT "6 Clicks" Mobility   ?Outcome Measure ? Help needed turning from your back to your side while in a flat bed without using bedrails?: A Little ?Help needed moving from lying on your back to sitting on the side of a flat bed without using bedrails?: A Little ?Help needed moving to and from a bed to a chair (including a wheelchair)?: A Little ?Help needed standing up from a chair using your arms (e.g., wheelchair or bedside chair)?: A Little ?Help needed to walk in hospital room?: A Little ?Help needed climbing 3-5  steps with a railing? : A Lot ?6 Click Score: 17 ? ?  ?End of Session Equipment Utilized During Treatment: Gait belt ?Activity Tolerance: Patient tolerated treatment well ?Patient left: in chair;with call bell/phone within reach;with chair alarm set ?Nurse Communication: Mobility status ?PT Visit Diagnosis: Muscle weakness (generalized) (M62.81);Other symptoms and signs involving the nervous system (R29.898) ?  ? ? ?Time: [id number] ?PT Time Calculation (min) (ACUTE ONLY): 23 min ? ?Charges:  $Gait Training: 8-22 mins ?$Therapeutic Activity: 8-22 mins    ?Ramond Dial ?09/26/2021, 1:23 PM ? ?Mee Hives, PT PhD ?Acute Rehab Dept. Number: [facility] [id number] and [place] [phone] ? ? ?

## 2021-09-26 NOTE — Progress Notes (Signed)
No significant change.  Patient not felt to be appropriate for inpatient rehab.  Not sure what the path forward is for him.  We will continue supportive efforts.  He certainly does not need inpatient care at this point but he has nowhere else to go. ?

## 2021-09-26 NOTE — Progress Notes (Signed)
[facility] Consultation Progress Note ? ?Patient: ELZA ORN [id number]   ?PCP: Pcp, No DOB: 1961-02-17   ?DOA: 06/29/2021   DOS: 09/26/2021   ?Date of Service: the patient was seen and examined on 09/26/2021 ?Primary service: Earnie Larsson, MD  ? ? 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  He was admitted on 2/3 and underwent ACDF of C3-4, C4-5, C5-5, and C6-7.  Post-operatively he was progressing slowly with residual weakness in both hands but improving lower extremity strength and function.  He developed some difficulty swallowing on 2/5 and SLP ordered and placed on thickened liquids.   Overnight 2/6, patient with increased difficulty swallowing and was made NPO but also noted to have dysarthria and difficulty moving extremities with numbness.  SLP evaluated and found to be moderate aspiration risk.   Also progressively tachycardic, tachypneic, and now febrile.  Code stroke activated and taken for CT/ CTA head and neck and was given decadron 10mg  once.  NIHSS 18.  CTH was negative for acute findings, and CTA head neck did not reveal any LVO but noted significant prevertebral soft tissue swelling with foci of gas present at the ventral epidural space at C4-5, small collection not excluded. On 2/7, patient with worsening confusion and concern for airway involvement, PCCM consulted and remained in the intensive care unit until he is transferred to the floor on 3/2 and PCCM requested transition to [facility] for medical assistance while patient remains under the neurosurgery service. ? ?Significant Hospital Events: ?2/3 ACDF C3-4, C4-5, C5-5, and C6-7 w/ Dr.  Annette Stable ?2/6 SLP eval for difficulty swallowing ?2/7 Code stroke overnight, neg for LVO, CT showing soft tissue swelling.  PCCM consulted for concern of airway management and AMS.  Went for evacuation of hematoma  ?2/18 CT Abd/Pelvis: showing bilateral segmental Pes, started on heparin and showing worsening pneumonia, febrile to  103.  PCT rising, 34.6, restarted on abx ?2/19 possible aspiration w/ severe hypoxia; intubated; Switching from heparin to angiomax given subtherapeutic levels despite increase in rate. ?2/20 Echo shows normal LV systolic function. There was notation of a + McConnell's sign c/w a large pulmonary embolus which is consistent with the finding of bilateral segmental pulmonary emboli noted on CT 07/14/2021. ?2/21 rash appreciated on back; stopped cefepim/vanc switched to zosyn; required low dose levo with increase in fentanyl ?2/22 remains intubated; unable to extubate to do increase RR and thick secretions ?2/23 Ketamine infusion added ?2/24: weaning fentanyl.  ?2/26 extubated ?2/28 tachycardia persists ?3/2: Modified barium swallow, continues n.p.o. with core track in place, likely will need PEG tube. ?3/6: s/p IR G-tube placement ?3/16: Foley catheter discontinued ?3/24: E. coli UTI/bacteremia>> cefazolin x7 days per ID ?4/5-4/6 Had a fall overnight and fell out of the bed and was unwitnessed but patient states he rolled out of the bed and did not get hurt. Has fall precautions now. ?4/79fell while trying to reach for his eyeglasses, reports landed on left knee, reports it is a soft fall, denies left knee pain, denies hit his head ?4/14 urine is cloudy, repeat urine culture + Citrobacter Freundii that is Sensitive to Bactrim,  ?4/16 reinserted foley due to recurrent urinary retention  ?medically clear awaiting SNF  placement; difficult to place per TOC. ?4/19-4/20 Vomited overnight; is recently started on regular diet with thin liquids and has continuous tube feeds were transitioned to nightly  only.  Vomited about 350 cc of tube feed material early this morning.  As needed Zofran given and his TF were stopped.  ?4/21 No Nausea overnight and KUB yesterday and showed "Nonobstructive bowel gas pattern. Moderate amount of oral contrast material throughout the colon." He was started back on IVF and will now stop after 12 hours given improvement in Nausea and Vomiting ?4/22 - He is feeling well and tolerating diet. Having some hand cramping. Continuing Rehabilitative efforts. ?4/23 - Exercising in the bed and wanting to sit in the chair. Continues to have some Hand Cramping. Had an unwitnessed fall without loss of consciousness or hitting head.  ?  ? ?Assessment and Plan: ?* Cervical myelopathy ([place]); severe cervical spinal stenosis with cord compression s/p ACDF [id number] on 2/3, complicated by epidural hematoma s/p evacuation 2/7 ?-Per neurosurgery.  ? ?Acute respiratory failure with hypoxia ([place]) ?-Continue Aspiration Precautions, now on room air. ? ?Acute pulmonary embolism ([place]) ?-CTA chest showed bilateral segmental PE.  ?-LE [place] negative for DVT.   ?-TTE with normal LV systolic function.  Supplemental oxygen weaned off. ?-Continue Eliquis 5 mg twice daily.   ?-Outpatient follow-up. ? ?Acute metabolic encephalopathy ?-CT head, MRI brain, EEG, TSH, B12, ammonia unrevealing.  Etiology likely multifactorial with acute respiratory failure secondary to aspiration pneumonia, bilateral pulmonary embolism.   ?-Completed course of antibiotics for aspiration pneumonia.  Seroquel and clonazepam discontinued.  Completed course of antibiotics for E. coli UTI/septicemia. ?-C/w Supportive care ? ?E. coli bacteremia ?-Urine culture and blood cultures x2 08/17/2021 positive for E. coli.  ?-Evaluated by Infectious Disease.   ?-Status post 7-day course of antibiotics per ID.  ? ?Severe sepsis due to recurrent aspiration pneumonia ?-Completed extensive course of antibiotics while under ICU care. S/p IR gtube 3/6. ?-Was receiving  tube feeds nocturnally however held on 09/20/2021 as patient noted to have vomiting.  ?-Cleared for regular diet with thin liquids per SLP;  ? ?Sinus tachycardia ?-Multifactorial including sepsis, dehydration, PE, agitation.   ?-HR improved   ? ?Elevated liver enzymes ?-Etiology likely secondary to sepsis from aspiration pneumonia as above.  Acute hepatitis panel negative.  RUQ ultrasound unremarkable. ? ?Hyponatremia ?-Etiology likely secondary to dehydration.   ?-Continue to monitor BMP intermittently ? ?Tobacco use disorder ?-Counseled on need for complete cessation.   ?-Treated with Nicotine Patch which has now been weaned off. ? ?Physical deconditioning ?-Significant weakness in all extremities as a result of cervical myelopathy and acute illness.  Slowly improving while inpatient. ?-Continue PT/OT efforts ? ?Urinary retention ?Resolved  ? ?Protein-calorie malnutrition, severe ([place]) ?--Dietitian following, appreciate assistance.   ?-We will leave out PEG tube for now ? ?Citrobacter infection ?-Patient developed Citrobacter UTI a few days, likely secondary to persistent urinary retention during hospitalization due to poor mobility status.  ?-Status post 7-day course Bactrim 800-160 mg twice daily. ? ?Thrombocytosis ?-Likely Reactive ? ?Normocytic anemia ?-Patient's Hgb/Hct has been relatively stable ? ?Fall during current hospitalization ?-[place] [address] out of bed overnight on 08/29/21 without injury; no loss of consciousness and it was unwitnessed but denies any head trauma and only nursing assessment did not exhibit any focal deficits and at baseline mentation. ?-Had another fall on the evening of 09/16/21 as well as another fall 09/19/2021 ?-Continue fall precautions.   ? ?Lumbar back pain ?-Patient complaining of low back pain.   ?-  Denies focal weakness, no paresthesias.   ?-Neurovascularly intact on physical exam.  MR L-spine with mild stenosis L3/4 with potential for neural compression lateral recesses, moderate-severe  stenosis L4-5 that could cause neural compression, L5-S1 with bilateral foraminal stenosis left worse than right. ? ?Hyperglycemia-resolved as of 07/12/2021 ?-Likely due to Steroids.   ?-Resolved.  A1c 5.2% ? ?W

## 2021-09-27 DIAGNOSIS — G959 Disease of spinal cord, unspecified: Secondary | ICD-10-CM | POA: Diagnosis not present

## 2021-09-27 LAB — GLUCOSE, CAPILLARY
Glucose-Capillary: 100 mg/dL — ABNORMAL HIGH (ref 70–99)
Glucose-Capillary: 105 mg/dL — ABNORMAL HIGH (ref 70–99)
Glucose-Capillary: 109 mg/dL — ABNORMAL HIGH (ref 70–99)
Glucose-Capillary: 129 mg/dL — ABNORMAL HIGH (ref 70–99)
Glucose-Capillary: 90 mg/dL (ref 70–99)
Glucose-Capillary: 94 mg/dL (ref 70–99)

## 2021-09-27 NOTE — Progress Notes (Signed)
[facility] ?PROGRESS NOTE ? ?Patient: Brian [name] [id number]   ?PCP: Pcp, No DOB: Mar 14, 1961   ?DOA: 06/29/2021   DOS: 09/27/2021   ? ?Subjective: No nausea no vomiting no fever no chills were no acute events. ? ?Objective:  ?Vitals:  ? 09/27/21 1200 09/27/21 1557  ?BP:  118/85  ?Pulse:  80  ?Resp:  16  ?Temp:  98.6 ?F (37 ?C)  ?SpO2: 100% 100%  ?  ?Clear to auscultation. ?S1-S2 present. ?Bowel sound present. ?Ambulating with physical therapy. ? ?Assessment and plan: ?Cervical myelopathy. ?Cord compression. ?Epidural hematoma SP evacuation. ?Management as per neurosurgery although patient is showing improvement in his mobility. ?Recent 6 click score is 17.  Ambulated 160 feet using a rolling walker. ? ?he unfortunately does not have any family support at home therefore need to be at independent level to live alone before going home. ? ?Author: ?Berle Mull, MD ?[facility] ?09/27/2021 7:09 PM   ?If 7PM-7AM, please contact night-coverage at [email or web address]  ?

## 2021-09-27 NOTE — Progress Notes (Addendum)
Occupational Therapy Treatment ?Patient Details ?Name: Brian [name] ?MRN: [id number] ?DOB: [birth year] ?Today's Date: 09/27/2021 ? ? ?History of present illness Pt is a 61 y/o male admitted 06/29/21 following C3-7 ACDF after progressive cervical myelopathy symptoms including ataxia, bil UE and LE shaking and weakness, and loss of function in his hands with subsequent fall. Pt initially had improvement in neurological symptoms, but postoperative course was complicated by dysphasia and ataxia secondary to and epidural hematoma with evacuation of hematoma on 2/7. Course complicated by delirium secondary to encephalopathy, sepsis secondary to aspiration PNA, reintubated 2/19-2/26 for airway protection, bilat segmental PEs 2/18. PEG placed 3/6. PMHx includes tobacco and EtOH use disorder. ?  ?OT comments ? Patient up in recliner upon arrival. Patient asked to walk and ambulated into hallway and back with seated break and min assist. Patient stood at sink to perform grooming tasks with assistance to load toothbrush and with standing balance. Static standing performed with one extremity activity to increase standing balance with self care and min to mod assist for balance. Patient continues to make good progress and will continue to be followed by acute OT.   ? ?Recommendations for follow up therapy are one component of a multi-disciplinary discharge planning process, led by the attending physician.  Recommendations may be updated based on patient status, additional functional criteria and insurance authorization. ?   ?Follow Up Recommendations ? Skilled nursing-short term rehab (<3 hours/day)  ?  ?Assistance Recommended at Discharge Frequent or constant Supervision/Assistance  ?Patient can return home with the following ? Two people to help with walking and/or transfers;Direct supervision/assist for medications management;Assist for transportation;Help with stairs or ramp for entrance;Assistance with feeding;Two people to  help with bathing/dressing/bathroom;Assistance with cooking/housework;Direct supervision/assist for financial management ?  ?Equipment Recommendations ? Wheelchair (measurements OT);Wheelchair cushion (measurements OT);Hospital bed;BSC/3in1;Tub/shower seat  ?  ?Recommendations for Other Services   ? ?  ?Precautions / Restrictions Precautions ?Precautions: Fall ?Precaution Booklet Issued: No ?Precaution Comments: previous G-tube site (pt pulled it out on evening of 4/29) ?Restrictions ?Weight Bearing Restrictions: No  ? ? ?  ? ?Mobility Bed Mobility ?Overal bed mobility: Needs Assistance ?  ?  ?  ?  ?  ?  ?General bed mobility comments: up in recliner upon arrival ?  ? ?Transfers ?Overall transfer level: Needs assistance ?Equipment used: Rolling walker (2 wheels) ?Transfers: Sit to/from Stand ?Sit to Stand: Min assist ?  ?  ?  ?  ?  ?General transfer comment: assistance to power up from relcienr and assitance with balance during transfer ?  ?  ?Balance Overall balance assessment: Needs assistance ?Sitting-balance support: Feet supported ?Sitting balance-Leahy Scale: Fair ?Sitting balance - Comments: up in recliner upon entry ?  ?Standing balance support: Single extremity supported, Bilateral upper extremity supported, During functional activity ?Standing balance-Leahy Scale: Poor ?Standing balance comment: stood at sink for grooming with min assist for balance while performing one extremity activity ?  ?  ?  ?  ?  ?  ?  ?  ?  ?  ?  ?   ? ?ADL either performed or assessed with clinical judgement  ? ?ADL Overall ADL's : Needs assistance/impaired ?  ?  ?Grooming: Wash/dry face;Oral care;Standing;Moderate assistance ?Grooming Details (indicate cue type and reason): mod assist due to assistance to load toothbrush. Used built up hand toothbrush to brush teeth.  Min assist for balance ?  ?  ?  ?  ?  ?  ?  ?  ?  ?  ?  ?  ?  ?  ?  ?  General ADL Comments: Patient dressed upon arrival ?  ? ?Extremity/Trunk Assessment   ?  ?  ?   ?  ?  ? ?Vision   ?  ?  ?Perception   ?  ?Praxis   ?  ? ?Cognition Arousal/Alertness: Awake/alert ?Behavior During Therapy: Anxious ?Overall Cognitive Status: Within Functional Limits for tasks assessed ?Area of Impairment: Problem solving, Awareness, Safety/judgement, Following commands, Attention, Memory ?  ?  ?  ?  ?  ?  ?  ?  ?Orientation Level: Situation, Time ?Current Attention Level: Selective ?Memory: Decreased short-term memory, Decreased recall of precautions ?Following Commands: Follows one step commands with increased time, Follows one step commands inconsistently ?Safety/Judgement: Decreased awareness of deficits, Decreased awareness of safety ?Awareness: Emergent, Intellectual ?Problem Solving: Slow processing, Requires verbal cues, Difficulty sequencing ?  ?  ?  ?   ?Exercises   ? ?  ?Shoulder Instructions   ? ? ?  ?General Comments    ? ? ?Pertinent Vitals/ Pain       Pain Assessment ?Pain Assessment: No/denies pain ?Pain Intervention(s): Monitored during session ? ?Home Living   ?  ?  ?  ?  ?  ?  ?  ?  ?  ?  ?  ?  ?  ?  ?  ?  ?  ?  ? ?  ?Prior Functioning/Environment    ?  ?  ?  ?   ? ?Frequency ? Min 2X/week  ? ? ? ? ?  ?Progress Toward Goals ? ?OT Goals(current goals can now be found in the care plan section) ? Progress towards OT goals: Progressing toward goals ? ?Acute Rehab OT Goals ?Patient Stated Goal: go home ?OT Goal Formulation: With patient ?Time For Goal Achievement: 10/05/21 ?Potential to Achieve Goals: Good ?ADL Goals ?Pt Will Perform Eating: with min assist;with assist to don/doff brace/orthosis;with adaptive utensils;sitting ?Pt Will Perform Grooming: sitting;with min assist ?Pt Will Perform Upper Body Bathing: with mod assist;sitting ?Pt Will Transfer to Toilet: with min assist;stand pivot transfer;bedside commode ?Pt/caregiver will Perform Home Exercise Program: Increased ROM;Increased strength;Both right and left upper extremity;With theraputty;With minimal assist;With written  HEP provided ?Additional ADL Goal #1: Pt will grasp objects with R hand and bring to mouth 10/10 trials in prep for self feeding ?Additional ADL Goal #2: Pt will grasp/release 5/5 objects with L hand to increase indep with ADLs  ?Plan Discharge plan remains appropriate   ? ?Co-evaluation ? ? ?   ?  ?  ?  ?  ? ?  ?AM-PAC OT "6 Clicks" Daily Activity     ?Outcome Measure ? ? Help from another person eating meals?: A Lot ?Help from another person taking care of personal grooming?: A Lot ?Help from another person toileting, which includes using toliet, bedpan, or urinal?: A Lot ?Help from another person bathing (including washing, rinsing, drying)?: A Lot ?Help from another person to put on and taking off regular upper body clothing?: A Little ?Help from another person to put on and taking off regular lower body clothing?: A Lot ?6 Click Score: 13 ? ?  ?End of Session Equipment Utilized During Treatment: Gait belt;Rolling walker (2 wheels) ? ?OT Visit Diagnosis: Other abnormalities of gait and mobility (R26.89);Muscle weakness (generalized) (M62.81);Feeding difficulties (R63.3);Other symptoms and signs involving the nervous system (R29.898);Other symptoms and signs involving cognitive function ?  ?Activity Tolerance Patient tolerated treatment well ?  ?Patient Left in chair;with call bell/phone within reach;with chair alarm set ?  ?Nurse Communication Mobility  status ?  ? ?   ? ?Time: [id number] ?OT Time Calculation (min): 27 min ? ?Charges: OT General Charges ?$OT Visit: 1 Visit ?OT Treatments ?$Self Care/Home Management : 8-22 mins ?$Therapeutic Activity: 8-22 mins ? ?Lodema Hong, OTA ?Acute Rehabilitation Services  ?Pager [phone] ?Office [phone]  ? ?[place] ?09/27/2021, 3:01 PM ?

## 2021-09-27 NOTE — Progress Notes (Signed)
Physical Therapy Treatment ?Patient Details ?Name: Brian [name] ?MRN: [id number] ?DOB: [birth year] ?Today's Date: 09/27/2021 ? ? ?History of Present Illness Pt is a 61 y/o male admitted 06/29/21 following C3-7 ACDF after progressive cervical myelopathy symptoms including ataxia, bil UE and LE shaking and weakness, and loss of function in his hands with subsequent fall. Pt initially had improvement in neurological symptoms, but postoperative course was complicated by dysphasia and ataxia secondary to and epidural hematoma with evacuation of hematoma on 2/7. Course complicated by delirium secondary to encephalopathy, sepsis secondary to aspiration PNA, reintubated 2/19-2/26 for airway protection, bilat segmental PEs 2/18. PEG placed 3/6. PMHx includes tobacco and EtOH use disorder. ? ?  ?PT Comments  ? ? Pt progressing well toward goals, still limited by ataxia/incoordination.  Emphasis on standing exercise for strengthening and control, safety and technique with sit to stands and progression of gait stability, quality and stamina. ?  ?Recommendations for follow up therapy are one component of a multi-disciplinary discharge planning process, led by the attending physician.  Recommendations may be updated based on patient status, additional functional criteria and insurance authorization. ? ?Follow Up Recommendations ? Acute inpatient rehab (3hours/day) ?  ?  ?Assistance Recommended at Discharge Frequent or constant Supervision/Assistance  ?Patient can return home with the following A little help with walking and/or transfers;A little help with bathing/dressing/bathroom;Assistance with cooking/housework;Assist for transportation;Help with stairs or ramp for entrance ?  ?Equipment Recommendations ? Rolling walker (2 wheels);Wheelchair (measurements PT);Wheelchair cushion (measurements PT)  ?  ?Recommendations for Other Services   ? ? ?  ?Precautions / Restrictions Precautions ?Precautions: Fall ?Precaution Booklet Issued:  No ?Precaution Comments: previous G-tube site (pt pulled it out on evening of 4/29) ?Restrictions ?Weight Bearing Restrictions: No  ?  ? ?Mobility ? Bed Mobility ?  ?  ?  ?  ?Supine to sit: Supervision, Modified independent (Device/Increase time) ?  ?  ?  ?  ? ?Transfers ?Overall transfer level: Needs assistance ?Equipment used: Rolling walker (2 wheels) ?Transfers: Sit to/from Stand ?Sit to Stand: Min assist ?  ?  ?  ?  ?  ?General transfer comment: cues for safety/hand placement/better technique continues.  Pt not yet consistently safe at a min guard level. ?  ? ?Ambulation/Gait ?Ambulation/Gait assistance: Min assist (episodes of min guard.) ?Gait Distance (Feet): 160 Feet ?Assistive device: Rolling walker (2 wheels) ?Gait Pattern/deviations: Step-through pattern ?Gait velocity: decreased,  pt needs to be reigned ?Gait velocity interpretation: <1.8 ft/sec, indicate of risk for recurrent falls ?  ?General Gait Details: varying unsteadiness continues with less coordinated L LE adducting and mildly ataxic, but not colliding with R LE.  pt able to generally control gait with minimal stability assist.  weak toe off and bil mid foot contact. ? ? ?Stairs ?  ?  ?  ?  ?  ? ? ?Wheelchair Mobility ?  ? ?Modified Rankin (Stroke Patients Only) ?  ? ? ?  ?Balance Overall balance assessment: Needs assistance ?Sitting-balance support: Feet supported ?Sitting balance-Leahy Scale: Fair ?  ?  ?Standing balance support: Single extremity supported, Bilateral upper extremity supported, During functional activity ?Standing balance-Leahy Scale: Poor ?Standing balance comment: needed UE support for at least 1 UE to complete standing exercises at EOB ?  ?  ?  ?  ?  ?  ?  ?  ?  ?  ?  ?  ? ?  ?Cognition Arousal/Alertness: Awake/alert ?Behavior During Therapy: [facility] for tasks assessed/performed ?Overall Cognitive Status: Within Functional Limits for tasks  assessed ?  ?  ?  ?  ?  ?  ?  ?  ?  ?  ?  ?  ?  ?  ?  ?  ?  ?  ?  ? ?  ?Exercises General  Exercises - Lower Extremity ?Hip ABduction/ADduction: Strengthening, Both, 10 reps, Standing ?Hip Flexion/Marching: Strengthening, Both, 10 reps, Standing ?Toe Raises: AROM, Both, 10 reps, Standing ?Heel Raises: Both, 15 reps, Standing ?Mini-Sqauts: AROM, 10 reps, Standing ? ?  ?General Comments   ?  ?  ? ?Pertinent Vitals/Pain Pain Assessment ?Pain Assessment: No/denies pain ?Pain Intervention(s): Monitored during session  ? ? ?Home Living   ?  ?  ?  ?  ?  ?  ?  ?  ?  ?   ?  ?Prior Function    ?  ?  ?   ? ?PT Goals (current goals can now be found in the care plan section) Acute Rehab PT Goals ?PT Goal Formulation: With patient ?Time For Goal Achievement: 10/01/21 ?Potential to Achieve Goals: Good ?Progress towards PT goals: Progressing toward goals ? ?  ?Frequency ? ? ? Min 5X/week ? ? ? ?  ?PT Plan Current plan remains appropriate  ? ? ?Co-evaluation   ?  ?  ?  ?  ? ?  ?AM-PAC PT "6 Clicks" Mobility   ?Outcome Measure ? Help needed turning from your back to your side while in a flat bed without using bedrails?: A Little ?Help needed moving from lying on your back to sitting on the side of a flat bed without using bedrails?: A Little ?Help needed moving to and from a bed to a chair (including a wheelchair)?: A Little ?Help needed standing up from a chair using your arms (e.g., wheelchair or bedside chair)?: A Little ?Help needed to walk in hospital room?: A Little ?Help needed climbing 3-5 steps with a railing? : A Lot ?6 Click Score: 17 ? ?  ?End of Session   ?Activity Tolerance: Patient tolerated treatment well ?Patient left: in chair;with call bell/phone within reach;with chair alarm set ?Nurse Communication: Mobility status ?PT Visit Diagnosis: Muscle weakness (generalized) (M62.81);Other symptoms and signs involving the nervous system (R29.898) ?  ? ? ?Time: [id number] ?PT Time Calculation (min) (ACUTE ONLY): 34 min ? ?Charges:  $Gait Training: 8-22 mins ?$Therapeutic Exercise: 8-22 mins          ?           ? ?09/27/2021 ? ?Ginger Carne., PT ?Acute Rehabilitation Services ?[phone]  (pager) ?[phone]  (office) ? ? ?[name] Lus Kriegel ?09/27/2021, 4:03 PM ? ?

## 2021-09-27 NOTE — Progress Notes (Signed)
? ?  Providing Compassionate, Quality Care - Together ? ?Hospital course: ?Patient status post C3-4, C4-5, C5-6, C6-7 anterior cervical discectomy with interbody fusion by Dr. Annette Stable on 06/29/2021. Increased difficulty swallowing with lung atelectasis and elevated temperatures on 07/02/2021. Made NPO following MBS by SLP. 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 CTA head and neck negative for stroke. MRI revealed epidural hematoma. Patient underwent exploration of his cervical fusion with evacuation of the epidural hematoma on 07/03/2021. Cortrak was placed on 07/04/2021. Patient's strength much improved since epidural hematoma evacuation. Cortrak removed 07/06/2021 and dysphagia 2 diet with nectar thick liquids started. Patient with delirium vs ETOH withdrawal. Discontinued steroids 07/06/2021. CIWA protocol started and discontinued 07/07/2021. CT and MRI 07/07/2021 negative. Patient developed respiratory distress, requiring intubation on 07/15/2021. Work up revealed aspiration PNA and PE. Patient was extubated on 07/22/2021. Foley catheter removed 07/24/2021. Patient transferred to 3W on 07/26/2021. Foley replaced 07/28/2021. PEG placed 07/30/2021. Foley removed 08/09/2021. PEG pulled out by patient 08/16/2021. Delirium worsening 08/17/2021. Haldol administered. IR replaced PEG on 08/17/2021. Patient with urosepsis 08/18/2021. Culture grew E. Coli. C/o LBP 08/28/2021. Lumbar MRI demonstrated chronic degenerative changes with spinal stenosis at L3-4, L4-5, and L5-S1. Patient suffered a fall overnight 08/29/2021 without injury. Worsening muscle spasms 09/11/2021, baclofen started. Patient transitioned to regular diet 09/12/2021. Episode of emesis overnight 4/19-4/20. No further episodes of emesis since 4/20. KUB 4/20 just demonstrated a nonobstructive bowel gas pattern. Fall without injury reported 09/16/2021. Foley catheter removed again. Another fall 09/19/2021. No injury per report, but per day  nurse c/o left lower back pain. Episode of emesis in the morning on 09/20/2021. PEG pulled out 09/21/2021. ? ?Subjective: ?Patient reports he is doing well. No new issues. ? ?Objective: ?Vital signs in last 24 hours: ?Temp:  [97.7 ?F (36.5 ?C)-98.5 ?F (36.9 ?C)] 97.8 ?F (36.6 ?C) (05/04 [id number]) ?Pulse Rate:  [91-100] 99 (05/04 0728) ?Resp:  [15-17] 16 (05/04 0728) ?BP: (110-129)/(77-91) 120/83 (05/04 [id number]) ?SpO2:  [100 %] 100 % (05/04 0728) ?Weight:  [53.3 kg] 53.3 kg (05/04 0500) ? ?Intake/Output from previous day: ?05/03 0701 - 05/04 0700 ?In: 714 [P.O.:714] ?Out: 750 [Urine:750] ?Intake/Output this shift: ?No intake/output data recorded. ? ?Alert; oriented to person, place, and time ?MAE, Generalized weakness ?Decreased fine motor BUE ?Incision is well-healed ? ?Lab Results: ?No results for input(s): WBC, HGB, HCT, PLT in the last 72 hours. ?BMET ?No results for input(s): NA, K, CL, CO2, GLUCOSE, BUN, CREATININE, CALCIUM in the last 72 hours. ? ?Studies/Results: ?No results found. ? ?Assessment/Plan: ?Patient is doing well after a very complicated hospital course following four level ACDF. He would like to discharge home. Continue efforts at mobilization. ? ? ? LOS: 90 days  ? ? ? ?Viona Gilmore, DNP, AGNP-C ?Nurse Practitioner ? ?[facility] ?[address], [place], [place] [address] ?[id number]    [id number] ? ?09/27/2021, 10:35 AM ? ? ? ? ?

## 2021-09-28 LAB — GLUCOSE, CAPILLARY
Glucose-Capillary: 104 mg/dL — ABNORMAL HIGH (ref 70–99)
Glucose-Capillary: 86 mg/dL (ref 70–99)
Glucose-Capillary: 108 mg/dL — ABNORMAL HIGH (ref 70–99)
Glucose-Capillary: 115 mg/dL — ABNORMAL HIGH (ref 70–99)
Glucose-Capillary: 131 mg/dL — ABNORMAL HIGH (ref 70–99)

## 2021-09-28 NOTE — Progress Notes (Signed)
Physical Therapy Treatment ?Patient Details ?Name: Brian [name] ?MRN: [id number] ?DOB: 11/06/1960 ?Today's Date: 09/28/2021 ? ? ?History of Present Illness Pt is a 61 y/o male admitted 06/29/21 following C3-7 ACDF after progressive cervical myelopathy symptoms including ataxia, bil UE and LE shaking and weakness, and loss of function in his hands with subsequent fall. Pt initially had improvement in neurological symptoms, but postoperative course was complicated by dysphasia and ataxia secondary to and epidural hematoma with evacuation of hematoma on 2/7. Course complicated by delirium secondary to encephalopathy, sepsis secondary to aspiration PNA, reintubated 2/19-2/26 for airway protection, bilat segmental PEs 2/18. PEG placed 3/6. PMHx includes tobacco and EtOH use disorder. ? ?  ?PT Comments  ? ? Pt progressing well.  Emphasis on standing exercise for strengthening and stability/control, sit to stands for technique/safety and progression of gait stability/stamina and quality. ?   ?Recommendations for follow up therapy are one component of a multi-disciplinary discharge planning process, led by the attending physician.  Recommendations may be updated based on patient status, additional functional criteria and insurance authorization. ? ?Follow Up Recommendations ? Acute inpatient rehab (3hours/day) ?  ?  ?Assistance Recommended at Discharge Frequent or constant Supervision/Assistance  ?Patient can return home with the following A little help with walking and/or transfers;A little help with bathing/dressing/bathroom;Assistance with cooking/housework;Assist for transportation;Help with stairs or ramp for entrance ?  ?Equipment Recommendations ? Rolling walker (2 wheels);Wheelchair (measurements PT);Wheelchair cushion (measurements PT)  ?  ?Recommendations for Other Services   ? ? ?  ?Precautions / Restrictions Precautions ?Precautions: Fall  ?  ? ?Mobility ? Bed Mobility ?Overal bed mobility: Needs Assistance ?Bed  Mobility: Supine to Sit, Sit to Supine ?  ?  ?Supine to sit: Supervision, Modified independent (Device/Increase time) ?Sit to supine: Supervision ?  ?  ?  ? ?Transfers ?Overall transfer level: Needs assistance ?  ?Transfers: Sit to/from Stand ?Sit to Stand: Min assist, Min guard ?  ?  ?  ?  ?  ?General transfer comment: VC for best/optimal hand placement in various situations.  Practiced sit to stands multiple times without stabilizing RW to help pt determine and attain how far forward he had to come to balance and boost without displacing the RW. ?  ? ?Ambulation/Gait ?Ambulation/Gait assistance: Min assist ?Gait Distance (Feet): 120 Feet ?Assistive device: Rolling walker (2 wheels) ?Gait Pattern/deviations: Step-through pattern ?  ?Gait velocity interpretation: <1.31 ft/sec, indicative of household ambulator ?  ?General Gait Details: After standing exercises, L LE>R LE more uncoordinated and fatigued making gait difficult.  Worked on posture and maintaining adequate gait width more than heel/toe today due to fatigue. ? ? ?Stairs ?  ?  ?  ?  ?  ? ? ?Wheelchair Mobility ?  ? ?Modified Rankin (Stroke Patients Only) ?  ? ? ?  ?Balance Overall balance assessment: Needs assistance ?Sitting-balance support: Feet supported ?Sitting balance-Leahy Scale: Fair ?  ?  ?Standing balance support: Single extremity supported, Bilateral upper extremity supported, During functional activity ?Standing balance-Leahy Scale: Poor ?Standing balance comment: needed UE support for at least 1 UE to complete standing exercises at EOB ?  ?  ?  ?  ?  ?  ?  ?  ?  ?  ?  ?  ? ?  ?Cognition Arousal/Alertness: Awake/alert ?Behavior During Therapy: [facility] for tasks assessed/performed ?Overall Cognitive Status: Within Functional Limits for tasks assessed ?  ?  ?  ?  ?  ?  ?  ?  ?  ?  ?  ?  ?  ?  ?  ?  ?  ?  ?  ? ?  ?  Exercises General Exercises - Lower Extremity ?Hip ABduction/ADduction: Strengthening, Both, 10 reps, Standing ?Hip Flexion/Marching:  Strengthening, Both, 10 reps, Standing ?Heel Raises: Both, 10 reps, Standing ?Mini-Sqauts: AROM, 10 reps, Standing ? ?  ?General Comments   ?  ?  ? ?Pertinent Vitals/Pain Pain Assessment ?Pain Assessment: Faces ?Faces Pain Scale: Hurts a little bit ?Pain Location: back ?Pain Descriptors / Indicators: Sore ?Pain Intervention(s): Monitored during session  ? ? ?Home Living   ?  ?  ?  ?  ?  ?  ?  ?  ?  ?   ?  ?Prior Function    ?  ?  ?   ? ?PT Goals (current goals can now be found in the care plan section) Acute Rehab PT Goals ?PT Goal Formulation: With patient ?Time For Goal Achievement: 10/01/21 ?Potential to Achieve Goals: Good ?Progress towards PT goals: Progressing toward goals ? ?  ?Frequency ? ? ? Min 5X/week ? ? ? ?  ?PT Plan Current plan remains appropriate  ? ? ?Co-evaluation   ?  ?  ?  ?  ? ?  ?AM-PAC PT "6 Clicks" Mobility   ?Outcome Measure ? Help needed turning from your back to your side while in a flat bed without using bedrails?: A Little ?Help needed moving from lying on your back to sitting on the side of a flat bed without using bedrails?: A Little ?Help needed moving to and from a bed to a chair (including a wheelchair)?: A Little ?Help needed standing up from a chair using your arms (e.g., wheelchair or bedside chair)?: A Little ?Help needed to walk in hospital room?: A Little ?Help needed climbing 3-5 steps with a railing? : A Lot ?6 Click Score: 17 ? ?  ?End of Session   ?Activity Tolerance: Patient tolerated treatment well (fatigue) ?Patient left: in chair;with call bell/phone within reach;with chair alarm set ?Nurse Communication: Mobility status ?PT Visit Diagnosis: Muscle weakness (generalized) (M62.81);Other symptoms and signs involving the nervous system (R29.898) ?  ? ? ?Time: [id number] ?PT Time Calculation (min) (ACUTE ONLY): 37 min ? ?Charges:  $Gait Training: 8-22 mins ?$Therapeutic Exercise: 8-22 mins          ?          ? ?09/28/2021 ? ?Ginger Carne., PT ?Acute Rehabilitation  Services ?[phone]  (pager) ?[phone]  (office) ? ? ?Brian [name] ?09/28/2021, 6:38 PM ? ?

## 2021-09-28 NOTE — Progress Notes (Signed)
? ?  Providing Compassionate, Quality Care - Together ? ? ?Hospital course: ?Patient status post C3-4, C4-5, C5-6, C6-7 anterior cervical discectomy with interbody fusion by Dr. Annette Stable on 06/29/2021. Increased difficulty swallowing with lung atelectasis and elevated temperatures on 07/02/2021. Made NPO following MBS by SLP. 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 CTA head and neck negative for stroke. MRI revealed epidural hematoma. Patient underwent exploration of his cervical fusion with evacuation of the epidural hematoma on 07/03/2021. Cortrak was placed on 07/04/2021. Patient's strength much improved since epidural hematoma evacuation. Cortrak removed 07/06/2021 and dysphagia 2 diet with nectar thick liquids started. Patient with delirium vs ETOH withdrawal. Discontinued steroids 07/06/2021. CIWA protocol started and discontinued 07/07/2021. CT and MRI 07/07/2021 negative. Patient developed respiratory distress, requiring intubation on 07/15/2021. Work up revealed aspiration PNA and PE. Patient was extubated on 07/22/2021. Foley catheter removed 07/24/2021. Patient transferred to 3W on 07/26/2021. Foley replaced 07/28/2021. PEG placed 07/30/2021. Foley removed 08/09/2021. PEG pulled out by patient 08/16/2021. Delirium worsening 08/17/2021. Haldol administered. IR replaced PEG on 08/17/2021. Patient with urosepsis 08/18/2021. Culture grew E. Coli. C/o LBP 08/28/2021. Lumbar MRI demonstrated chronic degenerative changes with spinal stenosis at L3-4, L4-5, and L5-S1. Patient suffered a fall overnight 08/29/2021 without injury. Worsening muscle spasms 09/11/2021, baclofen started. Patient transitioned to regular diet 09/12/2021. Episode of emesis overnight 4/19-4/20. No further episodes of emesis since 4/20. KUB 4/20 just demonstrated a nonobstructive bowel gas pattern. Fall without injury reported 09/16/2021. Foley catheter removed again. Another fall 09/19/2021. No injury per report, but per day  nurse c/o left lower back pain. Episode of emesis in the morning on 09/20/2021. PEG pulled out 09/21/2021. ? ?Subjective: ?Patient reports no new issues. ? ?Objective: ?Vital signs in last 24 hours: ?Temp:  [98.1 ?F (36.7 ?C)-98.7 ?F (37.1 ?C)] 98.4 ?F (36.9 ?C) (05/05 0813) ?Pulse Rate:  [80-121] 82 (05/05 1150) ?Resp:  [16-20] 18 (05/05 0813) ?BP: (102-118)/(71-91) 102/79 (05/05 1150) ?SpO2:  [100 %] 100 % (05/05 1150) ?Weight:  [52.6 kg] 52.6 kg (05/05 0500) ? ?Intake/Output from previous day: ?05/04 0701 - 05/05 0700 ?In: 963 [P.O.:960; I.V.:3] ?Out: 900 [Urine:900] ?Intake/Output this shift: ?No intake/output data recorded. ? ?Alert; oriented to person, place, and time ?MAE, Generalized weakness ?Decreased fine motor BUE ?Incision is well-healed ?  ? ?Lab Results: ?No results for input(s): WBC, HGB, HCT, PLT in the last 72 hours. ?BMET ?No results for input(s): NA, K, CL, CO2, GLUCOSE, BUN, CREATININE, CALCIUM in the last 72 hours. ? ?Studies/Results: ?No results found. ? ?Assessment/Plan: ?Patient is doing well after a very complicated hospital course following four level ACDF. He would like to discharge home. Continue efforts at mobilization. ? ? ? LOS: 91 days  ? ? ?Viona Gilmore, DNP, AGNP-C ?Nurse Practitioner ? ?[facility] ?[address], [address], [place], [place] [address] ?[id number]    [id number] ? ?09/28/2021, 12:14 PM ? ? ? ? ?

## 2021-09-28 NOTE — Progress Notes (Signed)
[facility] ?PROGRESS NOTE ? ?Patient: Brian [name] [id number]   ?PCP: Pcp, No DOB: 1960/09/10   ?DOA: 06/29/2021   DOS: 09/28/2021   ? ?Subjective: No acute complaints.  No acute events overnight. ? ?Objective:  ?Vitals:  ? 09/28/21 1150 09/28/21 1609  ?BP: 102/79 109/82  ?Pulse: 82 88  ?Resp:  18  ?Temp: 98.6 ?F (37 ?C) 98.2 ?F (36.8 ?C)  ?SpO2: 100% 100%  ?  ?Clear to auscultation. ?S1-S2 present. ?No edema. ?Improving strength. ? ?Assessment and plan: ?Cervical myelopathy ?Cord compression ?Epidural hematoma status postevacuation. ?Continue management per neurosurgery. ?Patient showing significant improvement in his mobility. ?Remains medically stable for discharge home. ? ?Author: ?Berle Mull, MD ?[facility] ?09/28/2021 7:35 PM   ?If 7PM-7AM, please contact night-coverage at [email or web address]  ?

## 2021-09-29 DIAGNOSIS — G959 Disease of spinal cord, unspecified: Secondary | ICD-10-CM | POA: Diagnosis not present

## 2021-09-29 LAB — GLUCOSE, CAPILLARY
Glucose-Capillary: 99 mg/dL (ref 70–99)
Glucose-Capillary: 101 mg/dL — ABNORMAL HIGH (ref 70–99)
Glucose-Capillary: 105 mg/dL — ABNORMAL HIGH (ref 70–99)
Glucose-Capillary: 183 mg/dL — ABNORMAL HIGH (ref 70–99)
Glucose-Capillary: 105 mg/dL — ABNORMAL HIGH (ref 70–99)

## 2021-09-29 NOTE — Plan of Care (Signed)
?  Problem: Pain Management: ?Goal: Pain level will decrease ?Outcome: Progressing ?  ?Problem: Skin Integrity: ?Goal: Will show signs of wound healing ?Outcome: Progressing ?  ?Problem: Health Behavior/Discharge Planning: ?Goal: Identification of resources available to assist in meeting health care needs will improve ?Outcome: Progressing ?  ?Problem: Safety: ?Goal: Ability to remain free from injury will improve ?Outcome: Progressing ?  ?

## 2021-09-29 NOTE — Progress Notes (Signed)
? ?  Providing Compassionate, Quality Care - Together ? ?NEUROSURGERY PROGRESS NOTE ? ? ?S: No issues overnight.  Patient sitting up and eating breakfast, doing well ? ?O: EXAM:  ?BP 111/79 (BP Location: Left Arm)   Pulse 89   Temp 98.1 ?F (36.7 ?C) (Oral)   Resp 16   Ht 5\' 10"  (1.778 m)   Wt 53.3 kg   SpO2 100%   BMI 16.86 kg/m?  ? ?Awake, alert, oriented x3 ?Thin appearing ?PERRL ?Speech fluent, appropriate  ?CNs grossly intact  ?Moves all extremities equally, antigravity throughout ? ?ASSESSMENT:  ?61 y.o. male with  ? ?Cervical spondylosis with stenosis, C3-7 ? ?-Status post C3-7 ACDF ? ?PLAN: ?-Continue therapies ?-Overall doing well ? ? ? ?Thank you for allowing me to participate in this patient's care.  Please do not hesitate to call with questions or concerns. ? ? ?Kynzie Polgar, DO ?Neurosurgeon ?[facility] ?Cell: [phone] ? ?

## 2021-09-29 NOTE — Progress Notes (Signed)
[facility] ?PROGRESS NOTE ? ?Patient: SCOTTIE METAYER TMA:[id number]   ?PCP: Pcp, No DOB: [birth year]   ?DOA: 06/29/2021   DOS: 09/29/2021   ? ?Subjective: Denies any acute complaint.  Continues to work with therapy. ? ?Objective:  ?Vitals:  ? 09/29/21 1539 09/29/21 1959  ?BP: 117/66 112/75  ?Pulse: 93 (!) 103  ?Resp: 17 18  ?Temp: 98.3 ?F (36.8 ?C) 98.1 ?F (36.7 ?C)  ?SpO2: 100% 100%  ?  ?S1-S2 present. ?Good strength bilaterally. ?Bowel sound present. ?Counting days in the hospital. ? ?Assessment and plan: ?Cervical myelopathy and cord compression S/P decompressive surgery ?Management per neurosurgery ?At present I suspect that the patient's strength is improving that soon enough he can be discharged home safely with maximum support. ? ? ?SANTOSH PETTER is a 61 year old male with past medical history significant for tobacco and EtOH use disorder who was admitted by neurosurgery for cervical myelopathy due to critical multilevel cervical spinal canal stenosis C3-6 with spinal cord compression and spinal cord signal change after recent fall with progressive upper and lower extremity weakness and tremors. ?Pt transferred to the floor on 3/2 and PCCM requested transition to [facility] for medical assistance while patient remains under the neurosurgery service. ? ?2/03 ACDF C3-4, C4-5, C5-5, and C6-7 w/ Dr. Jordan Likes ?2/07 Code stroke overnight, neg for LVO, CT showing soft tissue swelling. PCCM consulted for concern of airway management and AMS. Went for evacuation of epidural hematoma  ?2/18 CT Abd/Pelvis: showing bilateral segmental Pes, started on heparin and showing worsening pneumonia, febrile to  103.  PCT rising, 34.6, restarted on abx ?2/19 possible aspiration w/ severe hypoxia; intubated; Switching from heparin to angiomax given subtherapeutic levels despite increase in rate. ?2/23 Ketamine infusion added ?2/26 extubated ?3/02 Modified barium swallow, continues n.p.o. with core track in place,  ?3/06 s/p IR G-tube  placement ?3/24 E. coli UTI/bacteremia>> cefazolin x7 days per ID ?4/14 repeat urine culture + Citrobacter Freundii that is Sensitive to Bactrim,  ?4/16 reinserted foley due to recurrent urinary retention ?4/28 accidentally pulled out PEG tube the evening.  ? ?Author: ?Lynden Oxford, MD ?[facility] ?09/29/2021 8:20 PM   ?If 7PM-7AM, please contact night-coverage at [email or web address] ?

## 2021-09-30 DIAGNOSIS — G959 Disease of spinal cord, unspecified: Secondary | ICD-10-CM | POA: Diagnosis not present

## 2021-09-30 LAB — GLUCOSE, CAPILLARY
Glucose-Capillary: 100 mg/dL — ABNORMAL HIGH (ref 70–99)
Glucose-Capillary: 101 mg/dL — ABNORMAL HIGH (ref 70–99)
Glucose-Capillary: 93 mg/dL (ref 70–99)

## 2021-09-30 MED ORDER — HYDROCODONE-ACETAMINOPHEN 5-325 MG PO TABS
1.0000 | ORAL_TABLET | Freq: Four times a day (QID) | ORAL | Status: DC | PRN
  Administered 2021-10-04 – 2021-10-31 (×14): 1 via ORAL
  Filled 2021-09-30 (×15): qty 1

## 2021-09-30 MED ORDER — METOPROLOL TARTRATE 12.5 MG HALF TABLET
12.5000 mg | ORAL_TABLET | Freq: Two times a day (BID) | ORAL | Status: DC
  Administered 2021-09-30 – 2021-10-03 (×7): 12.5 mg via ORAL
  Filled 2021-09-30 (×7): qty 1

## 2021-09-30 MED ORDER — SENNOSIDES-DOCUSATE SODIUM 8.6-50 MG PO TABS
1.0000 | ORAL_TABLET | Freq: Two times a day (BID) | ORAL | Status: DC
  Administered 2021-09-30 – 2021-11-02 (×49): 1 via ORAL
  Filled 2021-09-30 (×61): qty 1

## 2021-09-30 MED ORDER — ACETAMINOPHEN 325 MG PO TABS
650.0000 mg | ORAL_TABLET | Freq: Four times a day (QID) | ORAL | Status: DC | PRN
  Administered 2021-10-16 – 2021-10-24 (×5): 650 mg via ORAL
  Filled 2021-09-30 (×5): qty 2

## 2021-09-30 MED ORDER — POLYETHYLENE GLYCOL 3350 17 G PO PACK
17.0000 g | PACK | Freq: Every day | ORAL | Status: DC | PRN
  Administered 2021-10-22: 17 g via ORAL
  Filled 2021-09-30: qty 1

## 2021-09-30 NOTE — Progress Notes (Signed)
? ?  Providing Compassionate, Quality Care - Together ? ?NEUROSURGERY PROGRESS NOTE ? ? ?S: No issues overnight.  ? ?O: EXAM:  ?BP 126/85 (BP Location: Right Arm)   Pulse 92   Temp 98.5 ?F (36.9 ?C) (Oral)   Resp 18   Ht 5\' 10"  (1.778 m)   Wt 52.8 kg   SpO2 100%   BMI 16.70 kg/m?  ? ?Awake, alert, oriented x3 ?Thin appearing ?PERRL ?Speech fluent, appropriate  ?CNs grossly intact  ?Moves all extremities equally, antigravity throughout ?  ?ASSESSMENT:  ?61 y.o. male with  ?  ?Cervical spondylosis with stenosis, C3-7 ?  ?-Status post C3-7 ACDF ?  ?PLAN: ?-Continue therapies ?-Overall doing well ?  ?  ? ? ? ?Thank you for allowing me to participate in this patient's care.  Please do not hesitate to call with questions or concerns. ? ? ?Fleurette Woolbright, DO ?Neurosurgeon ?[facility] ?Cell: [phone] ? ?

## 2021-09-30 NOTE — Plan of Care (Signed)
?  Problem: Education: ?Goal: Ability to verbalize activity precautions or restrictions will improve ?Outcome: Progressing ?Goal: Knowledge of the prescribed therapeutic regimen will improve ?Outcome: Progressing ?Goal: Understanding of discharge needs will improve ?Outcome: Progressing ?  ?Problem: Activity: ?Goal: Ability to avoid complications of mobility impairment will improve ?Outcome: Progressing ?Goal: Ability to tolerate increased activity will improve ?Outcome: Progressing ?Goal: Will remain free from falls ?Outcome: Progressing ?  ?Problem: Bowel/Gastric: ?Goal: Gastrointestinal status for postoperative course will improve ?Outcome: Progressing ?  ?Problem: Clinical Measurements: ?Goal: Ability to maintain clinical measurements within normal limits will improve ?Outcome: Progressing ?Goal: Postoperative complications will be avoided or minimized ?Outcome: Progressing ?Goal: Diagnostic test results will improve ?Outcome: Progressing ?  ?Problem: Pain Management: ?Goal: Pain level will decrease ?Outcome: Progressing ?  ?Problem: Skin Integrity: ?Goal: Will show signs of wound healing ?Outcome: Progressing ?  ?Problem: Health Behavior/Discharge Planning: ?Goal: Identification of resources available to assist in meeting health care needs will improve ?Outcome: Progressing ?  ?Problem: Bladder/Genitourinary: ?Goal: Urinary functional status for postoperative course will improve ?Outcome: Progressing ?  ?Problem: Safety: ?Goal: Ability to remain free from injury will improve ?Outcome: Progressing ?  ?Problem: Education: ?Goal: Knowledge of General Education information will improve ?Description: Including pain rating scale, medication(s)/side effects and non-pharmacologic comfort measures ?Outcome: Progressing ?  ?Problem: Health Behavior/Discharge Planning: ?Goal: Ability to manage health-related needs will improve ?Outcome: Progressing ?  ?Problem: Clinical Measurements: ?Goal: Ability to maintain clinical  measurements within normal limits will improve ?Outcome: Progressing ?Goal: Will remain free from infection ?Outcome: Progressing ?Goal: Diagnostic test results will improve ?Outcome: Progressing ?Goal: Respiratory complications will improve ?Outcome: Progressing ?Goal: Cardiovascular complication will be avoided ?Outcome: Progressing ?  ?Problem: Activity: ?Goal: Risk for activity intolerance will decrease ?Outcome: Progressing ?  ?Problem: Nutrition: ?Goal: Adequate nutrition will be maintained ?Outcome: Progressing ?  ?Problem: Coping: ?Goal: Level of anxiety will decrease ?Outcome: Progressing ?  ?Problem: Elimination: ?Goal: Will not experience complications related to bowel motility ?Outcome: Progressing ?Goal: Will not experience complications related to urinary retention ?Outcome: Progressing ?  ?Problem: Pain Managment: ?Goal: General experience of comfort will improve ?Outcome: Progressing ?  ?Problem: Safety: ?Goal: Ability to remain free from injury will improve ?Outcome: Progressing ?  ?Problem: Skin Integrity: ?Goal: Risk for impaired skin integrity will decrease ?Outcome: Progressing ?  ?

## 2021-09-30 NOTE — Progress Notes (Signed)
[facility] ?PROGRESS NOTE ? ?Patient: Brian [name]   ?PCP: Pcp, No DOB: [birth year]   ?DOA: 06/29/2021   DOS: 09/30/2021   ? ?Subjective: No nausea no vomiting no fever no chills. ? ?Objective:  ?Vitals:  ? 09/30/21 0959 09/30/21 1143  ?BP: 114/70 107/80  ?Pulse: 96 85  ?Resp:  18  ?Temp:  (!) 97.5 ?F (36.4 ?C)  ?SpO2:  100%  ?  ?S1-S2 present.  Clear to auscultation. ?Bowel sound present. ? ?Assessment and plan: ?I will be transitioning his medication from liquids to tablets and monitor. ?Strength improving. ? 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 ?Pt transferred to the floor on 3/2 and PCCM requested transition to [facility] for medical assistance while patient remains under the neurosurgery service. ? ?2/03 ACDF C3-4, C4-5, C5-5, and C6-7 w/ Dr. Annette Stable ?2/07 Code stroke overnight, neg for LVO, CT showing soft tissue swelling. PCCM consulted for concern of airway management and AMS. Went for evacuation of epidural hematoma  ?2/18 CT Abd/Pelvis: showing bilateral segmental Pes, started on heparin and showing worsening pneumonia, febrile to  103.  PCT rising, 34.6, restarted on abx ?2/19 possible aspiration w/ severe hypoxia; intubated; Switching from heparin to angiomax given subtherapeutic levels despite increase in rate. ?2/23 Ketamine infusion added ?2/26 extubated ?3/02 Modified barium swallow, continues n.p.o. with core track in place,  ?3/06 s/p IR G-tube placement ?3/24 E. coli UTI/bacteremia>> cefazolin x7 days per ID ?4/14 repeat urine culture + Citrobacter Freundii that is Sensitive to Bactrim,  ?4/16 reinserted foley due to recurrent urinary retention ?4/28  accidentally pulled out PEG tube the evening.  Tolerating oral diet with 3 times daily Premier protein shake  ? ?Author: ?Berle Mull, MD ?[facility] ?09/30/2021 5:51 PM   ?If 7PM-7AM, please contact night-coverage at [email or web address]  ?

## 2021-10-01 DIAGNOSIS — G959 Disease of spinal cord, unspecified: Secondary | ICD-10-CM | POA: Diagnosis not present

## 2021-10-01 NOTE — Progress Notes (Signed)
Physical Therapy Treatment ?Patient Details ?Name: Brian [name] ?MRN: [id number] ?DOB: 03/02/1961 ?Today's Date: 10/01/2021 ? ? ?History of Present Illness Pt is a 61 y/o male admitted 06/29/21 following C3-7 ACDF after progressive cervical myelopathy symptoms including ataxia, bil UE and LE shaking and weakness, and loss of function in his hands with subsequent fall. Pt initially had improvement in neurological symptoms, but postoperative course was complicated by dysphasia and ataxia secondary to and epidural hematoma with evacuation of hematoma on 2/7. Course complicated by delirium secondary to encephalopathy, sepsis secondary to aspiration PNA, reintubated 2/19-2/26 for airway protection, bilat segmental PEs 2/18. PEG placed 3/6. PMHx includes tobacco and EtOH use disorder. ? ?  ?PT Comments  ? ? Pt improving each session toward goals.  Updated, will continue same goals.  Emphasis on changing transfer technique to improve safety, progression of gait training for quality and stability and stair training with step to pattern, 2 rails and minimal assist. ?   ?Recommendations for follow up therapy are one component of a multi-disciplinary discharge planning process, led by the attending physician.  Recommendations may be updated based on patient status, additional functional criteria and insurance authorization. ? ?Follow Up Recommendations ? Acute inpatient rehab (3hours/day) ?  ?  ?Assistance Recommended at Discharge Frequent or constant Supervision/Assistance  ?Patient can return home with the following A little help with walking and/or transfers;A little help with bathing/dressing/bathroom;Assistance with cooking/housework;Assist for transportation;Help with stairs or ramp for entrance ?  ?Equipment Recommendations ? Rolling walker (2 wheels);Wheelchair (measurements PT);Wheelchair cushion (measurements PT)  ?  ?Recommendations for Other Services Rehab consult ? ? ?  ?Precautions / Restrictions  Precautions ?Precautions: Fall  ?  ? ?Mobility ? Bed Mobility ?  ?  ?  ?  ?  ?  ?  ?General bed mobility comments: up in recliner upon arrival ?  ? ?Transfers ?Overall transfer level: Needs assistance ?Equipment used: Rolling walker (2 wheels) ?Transfers: Sit to/from Stand ?Sit to Stand: Min assist ?  ?  ?  ?  ?  ?General transfer comment: Again VC for best/optimal hand placement in various situations.  Practiced sit to stands multiple times without stabilizing RW to help pt determine and attain how far forward he had to come to balance and boost without displacing the RW. ?  ? ?Ambulation/Gait ?Ambulation/Gait assistance: Min assist ?Gait Distance (Feet): 120 Feet (x2) ?Assistive device: Rolling walker (2 wheels) ?Gait Pattern/deviations: Step-through pattern ?  ?Gait velocity interpretation: <1.31 ft/sec, indicative of household ambulator ?  ?General Gait Details: Overall ataxic, but functional if he stay slow enough to maintain control ? ? ?Stairs ?Stairs: Yes ?Stairs assistance: Min assist ?Stair Management: Two rails, Step to pattern, Forwards ?Number of Stairs: 5 ?General stair comments: uncoordinated step to pattern, but safe with rails and assist ? ? ?Wheelchair Mobility ?  ? ?Modified Rankin (Stroke Patients Only) ?  ? ? ?  ?Balance   ?  ?Sitting balance-Leahy Scale: Fair ?  ?  ?  ?Standing balance-Leahy Scale: Poor ?Standing balance comment: needed UE support for at least 1 UE to complete standing exercises at EOB ?  ?  ?  ?  ?  ?  ?  ?  ?  ?  ?  ?  ? ?  ?Cognition Arousal/Alertness: Awake/alert ?Behavior During Therapy: [facility] for tasks assessed/performed ?Overall Cognitive Status: Within Functional Limits for tasks assessed ?  ?  ?  ?  ?  ?  ?  ?  ?  ?  ?  ?  ?  ?  ?  ?  ?  ?  ?  ? ?  ?  Exercises   ? ?  ?General Comments   ?  ?  ? ?Pertinent Vitals/Pain Pain Assessment ?Pain Assessment: Faces ?Faces Pain Scale: No hurt ?Pain Intervention(s): Monitored during session  ? ? ?Home Living   ?  ?  ?  ?  ?  ?  ?  ?   ?  ?   ?  ?Prior Function    ?  ?  ?   ? ?PT Goals (current goals can now be found in the care plan section) Acute Rehab PT Goals ?PT Goal Formulation: With patient ?Time For Goal Achievement: 10/15/21 ?Potential to Achieve Goals: Good ?Progress towards PT goals: Progressing toward goals (continue with same goals) ? ?  ?Frequency ? ? ? Min 5X/week ? ? ? ?  ?PT Plan Current plan remains appropriate  ? ? ?Co-evaluation   ?  ?  ?  ?  ? ?  ?AM-PAC PT "6 Clicks" Mobility   ?Outcome Measure ? Help needed turning from your back to your side while in a flat bed without using bedrails?: A Little ?Help needed moving from lying on your back to sitting on the side of a flat bed without using bedrails?: A Little ?Help needed moving to and from a bed to a chair (including a wheelchair)?: A Little ?Help needed standing up from a chair using your arms (e.g., wheelchair or bedside chair)?: A Little ?Help needed to walk in hospital room?: A Little ?Help needed climbing 3-5 steps with a railing? : A Little ?6 Click Score: 18 ? ?  ?End of Session   ?Activity Tolerance: Patient tolerated treatment well ?Patient left: in chair;with call bell/phone within reach;with chair alarm set ?Nurse Communication: Mobility status ?PT Visit Diagnosis: Muscle weakness (generalized) (M62.81);Other symptoms and signs involving the nervous system (R29.898) ?  ? ? ?Time: [id number] ?PT Time Calculation (min) (ACUTE ONLY): 37 min ? ?Charges:  $Gait Training: 8-22 mins ?$Therapeutic Activity: 8-22 mins          ?          ? ?10/01/2021 ? ?Ginger Carne., PT ?Acute Rehabilitation Services ?[phone]  (pager) ?[phone]  (office) ? ? ?[name] Genifer Lazenby ?10/01/2021, 5:30 PM ? ?

## 2021-10-01 NOTE — Progress Notes (Signed)
? ?  Providing Compassionate, Quality Care - Together ? ?Hospital course: ?Patient status post C3-4, C4-5, C5-6, C6-7 anterior cervical discectomy with interbody fusion by Dr. Annette Stable on 06/29/2021. Increased difficulty swallowing with lung atelectasis and elevated temperatures on 07/02/2021. Made NPO following MBS by SLP. 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 CTA head and neck negative for stroke. MRI revealed epidural hematoma. Patient underwent exploration of his cervical fusion with evacuation of the epidural hematoma on 07/03/2021. Cortrak was placed on 07/04/2021. Patient's strength much improved since epidural hematoma evacuation. Cortrak removed 07/06/2021 and dysphagia 2 diet with nectar thick liquids started. Patient with delirium vs ETOH withdrawal. Discontinued steroids 07/06/2021. CIWA protocol started and discontinued 07/07/2021. CT and MRI 07/07/2021 negative. Patient developed respiratory distress, requiring intubation on 07/15/2021. Work up revealed aspiration PNA and PE. Patient was extubated on 07/22/2021. Foley catheter removed 07/24/2021. Patient transferred to 3W on 07/26/2021. Foley replaced 07/28/2021. PEG placed 07/30/2021. Foley removed 08/09/2021. PEG pulled out by patient 08/16/2021. Delirium worsening 08/17/2021. Haldol administered. IR replaced PEG on 08/17/2021. Patient with urosepsis 08/18/2021. Culture grew E. Coli. C/o LBP 08/28/2021. Lumbar MRI demonstrated chronic degenerative changes with spinal stenosis at L3-4, L4-5, and L5-S1. Patient suffered a fall overnight 08/29/2021 without injury. Worsening muscle spasms 09/11/2021, baclofen started. Patient transitioned to regular diet 09/12/2021. Episode of emesis overnight 4/19-4/20. No further episodes of emesis since 4/20. KUB 4/20 just demonstrated a nonobstructive bowel gas pattern. Fall without injury reported 09/16/2021. Foley catheter removed again. Another fall 09/19/2021. No injury per report, but per day  nurse c/o left lower back pain. Episode of emesis in the morning on 09/20/2021. PEG pulled out 09/21/2021. ?  ? ?Subjective: ?Patient reports no issues overnight. He'd like to go outside with therapies if possible. ? ?Objective: ?Vital signs in last 24 hours: ?Temp:  [97.5 ?F (36.4 ?C)-98.3 ?F (36.8 ?C)] 98.3 ?F (36.8 ?C) (05/08 1149) ?Pulse Rate:  [81-106] 106 (05/08 1149) ?Resp:  [16-17] 16 (05/08 1149) ?BP: (99-113)/(72-86) 107/72 (05/08 1149) ?SpO2:  [100 %] 100 % (05/08 1149) ?Weight:  [54.8 kg] 54.8 kg (05/08 0500) ? ?Intake/Output from previous day: ?05/07 0701 - 05/08 0700 ?In: 44 [P.O.:420] ?Out: 550 [Urine:550] ?Intake/Output this shift: ?Total I/O ?In: -  ?Out: 120 [Urine:120] ? ?Alert; oriented to person, place, and time ?MAE, Generalized weakness ?Decreased fine motor BUE ?Incision is well-healed ? ?Lab Results: ?No results for input(s): WBC, HGB, HCT, PLT in the last 72 hours. ?BMET ?No results for input(s): NA, K, CL, CO2, GLUCOSE, BUN, CREATININE, CALCIUM in the last 72 hours. ? ?Studies/Results: ?No results found. ? ?Assessment/Plan: ?Patient is doing well after a very complicated hospital course following four level ACDF. He would like to discharge home. Continue efforts at mobilization. ? ? ? LOS: 94 days  ? ? ? ?Viona Gilmore, DNP, AGNP-C ?Nurse Practitioner ? ?[facility] ?[address], [place], [place] [address] ?[id number]    [id number] ? ?10/01/2021, 12:25 PM ? ? ? ? ?

## 2021-10-01 NOTE — Progress Notes (Signed)
[facility] ?PROGRESS NOTE ? ?Patient: Brian [name] [id number]   ?PCP: Pcp, No DOB: 1960-06-05   ?DOA: 06/29/2021   DOS: 10/01/2021   ? ?Subjective: Denies any acute complaint.  No nausea no vomiting.  No fever no chills. ? ?Objective:   ?S1-S2 present. ?Bowel sound present. ?Continues to have bilateral upper and lower extremity weakness although improving. ? ?Assessment and plan: ?Active Issues ?Disposition-patient is medically stable.  Currently does not have safe discharge plan.  Continue to work with PT and OT. ? ?Severe protein calorie malnutrition ?Continues to drink protein shake from home Premier ?Body mass index is 17.35 kg/m?Marland Kitchen ?Nutrition Problem: Severe Malnutrition ?Etiology: social / environmental circumstances ?Nutrition Interventions: ?Interventions: Tube feeding, Prostat, MVI  ? ?Brief Hospital course ?Brian [name] is a 61 year old male with past medical history significant for tobacco and EtOH use disorder who was admitted by neurosurgery for cervical myelopathy due to critical multilevel cervical spinal canal stenosis C3-6 with spinal cord compression and spinal cord signal change after recent fall with progressive upper and lower extremity weakness and tremors. ?Pt transferred to the floor on 3/2 and PCCM requested transition to [facility] for medical assistance while patient remains under the neurosurgery service. ? ?2/03 ACDF C3-4, C4-5, C5-5, and C6-7 w/ Dr. Annette Stable ?2/07 Code stroke overnight, neg for LVO, CT showing soft tissue swelling. PCCM consulted for concern of airway management and AMS. Went for evacuation of epidural hematoma  ?2/18 CT Abd/Pelvis: showing bilateral segmental Pes, started on heparin and showing worsening pneumonia, febrile to  103.  PCT rising, 34.6, restarted on abx ?2/19 possible aspiration w/ severe hypoxia; intubated; Switching from heparin to angiomax given subtherapeutic levels despite increase in rate. ?2/23 Ketamine infusion added ?2/26 extubated ?3/02  Modified barium swallow, continues n.p.o. with core track in place,  ?3/06 s/p IR G-tube placement ?3/24 E. coli UTI/bacteremia>> cefazolin x7 days per ID ?4/14 repeat urine culture + Citrobacter Freundii that is Sensitive to Bactrim,  ?4/16 reinserted foley due to recurrent urinary retention ?4/28 accidentally pulled out PEG tube the evening.  Tolerating oral diet with 3 times daily Premier protein shake  ? ?Principal Problem: ?  Cervical myelopathy ([place]); severe cervical spinal stenosis with cord compression s/p ACDF [id number] on 2/3, complicated by epidural hematoma s/p evacuation 2/7 ?Active Problems: ?  Acute respiratory failure with hypoxia ([place]) ?  Acute pulmonary embolism ([place]) ?  Acute metabolic encephalopathy ?  Severe sepsis due to recurrent aspiration pneumonia ?  E. coli bacteremia ?  Sinus tachycardia ?  Elevated liver enzymes ?  Hyponatremia ?  Tobacco use disorder ?  Physical deconditioning ?  Urinary retention ?  Protein-calorie malnutrition, severe ([place]) ?  Lumbar back pain ?  Fall during current hospitalization ?  Normocytic anemia ?  Thrombocytosis ?  Citrobacter infection ?  Vomiting ?  Abnormal LFTs ?  Aspiration pneumonia ([place]) ?  ? ?Author: ?Berle Mull, MD ?[facility] ?10/01/2021 7:43 PM   ?If 7PM-7AM, please contact night-coverage at [email or web address]   ?

## 2021-10-02 DIAGNOSIS — G959 Disease of spinal cord, unspecified: Secondary | ICD-10-CM | POA: Diagnosis not present

## 2021-10-02 NOTE — Progress Notes (Signed)
[facility] ?PROGRESS NOTE ? ?Patient: Brian [name] [id number]   ?PCP: Pcp, No DOB: [birth year]   ?DOA: 06/29/2021   DOS: 10/02/2021   ? ?Subjective: No nausea no vomiting or no fever no chills. ? ?Objective:   ?Clear to auscultation. ?S1-S2 present ?Bowel sound present. ? ?Assessment and plan: ?Active Issues ?Disposition.  PT OT working with the patient. ? ?Brief Hospital course ?MACIEL WYLLIE is a 61 year old male with past medical history significant for tobacco and EtOH use disorder who was admitted by neurosurgery for cervical myelopathy due to critical multilevel cervical spinal canal stenosis C3-6 with spinal cord compression and spinal cord signal change after recent fall with progressive upper and lower extremity weakness and tremors. ?Pt transferred to the floor on 3/2 and PCCM requested transition to [facility] for medical assistance while patient remains under the neurosurgery service. ? ?2/03 ACDF C3-4, C4-5, C5-5, and C6-7 w/ Dr. Annette Stable ?2/07 Code stroke overnight, neg for LVO, CT showing soft tissue swelling. PCCM consulted for concern of airway management and AMS. Went for evacuation of epidural hematoma  ?2/18 CT Abd/Pelvis: showing bilateral segmental Pes, started on heparin and showing worsening pneumonia, febrile to  103.  PCT rising, 34.6, restarted on abx ?2/19 possible aspiration w/ severe hypoxia; intubated; Switching from heparin to angiomax given subtherapeutic levels despite increase in rate. ?2/23 Ketamine infusion added ?2/26 extubated ?3/02 Modified barium swallow, continues n.p.o. with core track in place,  ?3/06 s/p IR G-tube placement ?3/24 E. coli UTI/bacteremia>> cefazolin x7 days per ID ?4/14 repeat urine culture + Citrobacter Freundii that is Sensitive to Bactrim,  ?4/16 reinserted foley due to recurrent urinary retention ?4/28 accidentally pulled out PEG tube the evening.   ?Tolerating oral diet with 3 times daily Premier protein shake ?Medically stable.  Awaiting  placement/improvement with mobility to be discharged home.  ? ?Principal Problem: ?  Cervical myelopathy ([place]); severe cervical spinal stenosis with cord compression s/p ACDF [id number] on 2/3, complicated by epidural hematoma s/p evacuation 2/7 ?Active Problems: ?  Acute respiratory failure with hypoxia ([place]) ?  Acute pulmonary embolism ([place]) ?  Acute metabolic encephalopathy ?  Severe sepsis due to recurrent aspiration pneumonia ?  E. coli bacteremia ?  Sinus tachycardia ?  Elevated liver enzymes ?  Hyponatremia ?  Tobacco use disorder ?  Physical deconditioning ?  Urinary retention ?  Protein-calorie malnutrition, severe ([place]) ?  Lumbar back pain ?  Fall during current hospitalization ?  Normocytic anemia ?  Thrombocytosis ?  Citrobacter infection ?  Vomiting ?  Abnormal LFTs ?  Aspiration pneumonia ([place]) ?  ? ?Author: ?Berle Mull, MD ?[facility] ?10/02/2021 10:30 PM   ?If 7PM-7AM, please contact night-coverage at [email or web address]   ?

## 2021-10-02 NOTE — Progress Notes (Signed)
Physical Therapy Treatment ?Patient Details ?Name: Brian [name] ?MRN: [id number] ?DOB: 27-Feb-1961 ?Today's Date: 10/02/2021 ? ? ?History of Present Illness Pt is a 61 y/o male admitted 06/29/21 following C3-7 ACDF after progressive cervical myelopathy symptoms including ataxia, bil UE and LE shaking and weakness, and loss of function in his hands with subsequent fall. Pt initially had improvement in neurological symptoms, but postoperative course was complicated by dysphasia and ataxia secondary to and epidural hematoma with evacuation of hematoma on 2/7. Course complicated by delirium secondary to encephalopathy, sepsis secondary to aspiration PNA, reintubated 2/19-2/26 for airway protection, bilat segmental PEs 2/18. PEG placed 3/6. PMHx includes tobacco and EtOH use disorder. ? ?  ?PT Comments  ? ? Pt was noticeably tired today, but still eager to work.  Emphasis on standing exercise, transfer safety/technique, progression of gait stability/work on turns ?   ?Recommendations for follow up therapy are one component of a multi-disciplinary discharge planning process, led by the attending physician.  Recommendations may be updated based on patient status, additional functional criteria and insurance authorization. ? ?Follow Up Recommendations ? Acute inpatient rehab (3hours/day) ?  ?  ?Assistance Recommended at Discharge Frequent or constant Supervision/Assistance  ?Patient can return home with the following   ?  ?Equipment Recommendations ? Rolling walker (2 wheels);Wheelchair (measurements PT);Wheelchair cushion (measurements PT)  ?  ?Recommendations for Other Services   ? ? ?  ?Precautions / Restrictions Precautions ?Precautions: Fall ?Restrictions ?Weight Bearing Restrictions: No  ?  ? ?Mobility ? Bed Mobility ?Overal bed mobility: Needs Assistance ?Bed Mobility: Supine to Sit, Sit to Supine (scooting) ?  ?  ?Supine to sit: Supervision, Modified independent (Device/Increase time) ?Sit to supine: Supervision,  Modified independent (Device/Increase time) ?  ?General bed mobility comments: needs time, but safe transition supine to sit to supine and controls scooting with bil UE's for safe transfer to EOB ?  ? ?Transfers ?Overall transfer level: Needs assistance ?Equipment used: Rolling walker (2 wheels) ?Transfers: Sit to/from Stand ?Sit to Stand: Min guard ?Stand pivot transfers: Min guard ?  ?  ?  ?  ?General transfer comment: Worked on sit to stands with both hands on sitting surfaces with/without armrests. ?  ? ?Ambulation/Gait ?Ambulation/Gait assistance: Min assist, Min guard ?Gait Distance (Feet): 150 Feet ?Assistive device: Rolling walker (2 wheels) ?Gait Pattern/deviations: Step-through pattern ?  ?Gait velocity interpretation: <1.31 ft/sec, indicative of household ambulator ?  ?General Gait Details: Overall ataxic, but functional if he stay slow enough to maintain control.  Working today on posture and turns without getting feet tangled up. ? ? ?Stairs ?  ?  ?  ?  ?  ? ? ?Wheelchair Mobility ?  ? ?Modified Rankin (Stroke Patients Only) ?  ? ? ?  ?Balance   ?Sitting-balance support: Feet supported ?Sitting balance-Leahy Scale: Fair (and better) ?  ?  ?Standing balance support: Single extremity supported, Bilateral upper extremity supported, During functional activity ?Standing balance-Leahy Scale: Poor ?Standing balance comment: needed UE support for at least 1 UE to complete standing exercises at EOB ?  ?  ?  ?  ?  ?  ?  ?  ?  ?  ?  ?  ? ?  ?Cognition Arousal/Alertness: Awake/alert ?Behavior During Therapy: [facility] for tasks assessed/performed ?Overall Cognitive Status: Within Functional Limits for tasks assessed ?  ?  ?  ?  ?  ?  ?  ?  ?  ?  ?  ?  ?  ?  ?  ?  ?  ?  ?  ? ?  ?  Exercises General Exercises - Lower Extremity ?Hip ABduction/ADduction: Strengthening, Both, 10 reps, Standing ?Hip Flexion/Marching: Strengthening, Both, 10 reps, Standing ?Heel Raises: Both, 10 reps, Standing ?Mini-Sqauts: AROM, 10 reps,  Standing ? ?  ?General Comments   ?  ?  ? ?Pertinent Vitals/Pain Pain Assessment ?Pain Assessment: Faces ?Faces Pain Scale: Hurts a little bit ?Pain Location: hands ?Pain Intervention(s): Monitored during session  ? ? ?Home Living   ?  ?  ?  ?  ?  ?  ?  ?  ?  ?   ?  ?Prior Function    ?  ?  ?   ? ?PT Goals (current goals can now be found in the care plan section) Acute Rehab PT Goals ?Patient Stated Goal: to walk more ?PT Goal Formulation: With patient ?Time For Goal Achievement: 10/15/21 ?Potential to Achieve Goals: Good ?Progress towards PT goals: Progressing toward goals ? ?  ?Frequency ? ? ? Min 5X/week ? ? ? ?  ?PT Plan Current plan remains appropriate  ? ? ?Co-evaluation   ?  ?  ?  ?  ? ?  ?AM-PAC PT "6 Clicks" Mobility   ?Outcome Measure ? Help needed turning from your back to your side while in a flat bed without using bedrails?: A Little ?Help needed moving from lying on your back to sitting on the side of a flat bed without using bedrails?: A Little ?Help needed moving to and from a bed to a chair (including a wheelchair)?: A Little ?Help needed standing up from a chair using your arms (e.g., wheelchair or bedside chair)?: A Little ?Help needed to walk in hospital room?: A Little ?Help needed climbing 3-5 steps with a railing? : A Little ?6 Click Score: 18 ? ?  ?End of Session   ?Activity Tolerance: Patient tolerated treatment well (generally tired today, bad sleep) ?Patient left: in chair;with call bell/phone within reach;with chair alarm set ?Nurse Communication: Mobility status ?PT Visit Diagnosis: Other abnormalities of gait and mobility (R26.89);Other symptoms and signs involving the nervous system (R29.898) ?  ? ? ?Time: 1010-1037 ?PT Time Calculation (min) (ACUTE ONLY): 27 min ? ?Charges:  $Gait Training: 8-22 mins ?$Therapeutic Exercise: 8-22 mins          ?          ? ?10/02/2021 ? ?Brian Carne., PT ?Acute Rehabilitation Services ?[phone]  (pager) ?[phone]  (office) ? ? ?Brian [name]  Brian [name] ?10/02/2021, 11:06 AM ? ?

## 2021-10-02 NOTE — Plan of Care (Signed)
?  Problem: Education: ?Goal: Ability to verbalize activity precautions or restrictions will improve ?Outcome: Progressing ?Goal: Knowledge of the prescribed therapeutic regimen will improve ?Outcome: Progressing ?Goal: Understanding of discharge needs will improve ?Outcome: Progressing ?  ?Problem: Activity: ?Goal: Ability to tolerate increased activity will improve ?Outcome: Progressing ?Goal: Will remain free from falls ?Outcome: Progressing ?  ?Problem: Pain Management: ?Goal: Pain level will decrease ?Outcome: Progressing ?  ?Problem: Skin Integrity: ?Goal: Will show signs of wound healing ?Outcome: Progressing ?  ?

## 2021-10-02 NOTE — Progress Notes (Signed)
? ?  Providing Compassionate, Quality Care - Together ? ?Hospital course: ?Patient status post C3-4, C4-5, C5-6, C6-7 anterior cervical discectomy with interbody fusion by Dr. Annette Stable on 06/29/2021. Increased difficulty swallowing with lung atelectasis and elevated temperatures on 07/02/2021. Made NPO following MBS by SLP. 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 CTA head and neck negative for stroke. MRI revealed epidural hematoma. Patient underwent exploration of his cervical fusion with evacuation of the epidural hematoma on 07/03/2021. Cortrak was placed on 07/04/2021. Patient's strength much improved since epidural hematoma evacuation. Cortrak removed 07/06/2021 and dysphagia 2 diet with nectar thick liquids started. Patient with delirium vs ETOH withdrawal. Discontinued steroids 07/06/2021. CIWA protocol started and discontinued 07/07/2021. CT and MRI 07/07/2021 negative. Patient developed respiratory distress, requiring intubation on 07/15/2021. Work up revealed aspiration PNA and PE. Patient was extubated on 07/22/2021. Foley catheter removed 07/24/2021. Patient transferred to 3W on 07/26/2021. Foley replaced 07/28/2021. PEG placed 07/30/2021. Foley removed 08/09/2021. PEG pulled out by patient 08/16/2021. Delirium worsening 08/17/2021. Haldol administered. IR replaced PEG on 08/17/2021. Patient with urosepsis 08/18/2021. Culture grew E. Coli. C/o LBP 08/28/2021. Lumbar MRI demonstrated chronic degenerative changes with spinal stenosis at L3-4, L4-5, and L5-S1. Patient suffered a fall overnight 08/29/2021 without injury. Worsening muscle spasms 09/11/2021, baclofen started. Patient transitioned to regular diet 09/12/2021. Episode of emesis overnight 4/19-4/20. No further episodes of emesis since 4/20. KUB 4/20 just demonstrated a nonobstructive bowel gas pattern. Fall without injury reported 09/16/2021. Foley catheter removed again. Another fall 09/19/2021. No injury per report, but per day  nurse c/o left lower back pain. Episode of emesis in the morning on 09/20/2021. PEG pulled out 09/21/2021. ? ?Subjective: ?Patient reports no new issues overnight. ? ?Objective: ?Vital signs in last 24 hours: ?Temp:  [97.4 ?F (36.3 ?C)-98.7 ?F (37.1 ?C)] 98.2 ?F (36.8 ?C) (05/09 1148) ?Pulse Rate:  [78-105] 79 (05/09 1148) ?Resp:  [14-18] 16 (05/09 1148) ?BP: (100-115)/(76-85) 100/76 (05/09 1148) ?SpO2:  [100 %] 100 % (05/09 1148) ?Weight:  [53.4 kg] 53.4 kg (05/09 0410) ? ?Intake/Output from previous day: ?05/08 0701 - 05/09 0700 ?In: -  ?Out: [id number] ?Intake/Output this shift: ?Total I/O ?In: -  ?Out: 300 [Urine:300] ? ?Alert; oriented to person, place, and time ?MAE, Generalized weakness ?Decreased fine motor BUE ?Incision is well-healed ? ?Lab Results: ?No results for input(s): WBC, HGB, HCT, PLT in the last 72 hours. ?BMET ?No results for input(s): NA, K, CL, CO2, GLUCOSE, BUN, CREATININE, CALCIUM in the last 72 hours. ? ?Studies/Results: ?No results found. ? ?Assessment/Plan: ?Patient is doing well after a very complicated hospital course following four level ACDF. He would like to discharge home. Continue efforts at mobilization. ? ? ? LOS: 95 days  ? ? ?Viona Gilmore, DNP, AGNP-C ?Nurse Practitioner ? ?[facility] ?[address], [place], [place] [address] ?[id number]    [id number] ? ?10/02/2021, 11:55 AM ? ? ? ? ?

## 2021-10-02 NOTE — Progress Notes (Signed)
Nutrition Follow-up ? ?DOCUMENTATION CODES:  ?Severe malnutrition in context of social or environmental circumstances, Underweight ? ?INTERVENTION:  ?Continue current diet as ordered, encourage PO intake ?Boost Plus TID to provide 360kcal and 14g of protein per carton ?Continue vitamin regimen ? ?NUTRITION DIAGNOSIS:  ?Severe Malnutrition related to social / environmental circumstances as evidenced by severe muscle depletion, severe fat depletion.  ?- ongoing  ? ?GOAL:  ?Patient will meet greater than or equal to 90% of their needs  ?- progressing, increasing oral intake ? ?MONITOR:  ?PO intake, Supplement acceptance, Skin, Weight trends, I & O's ? ?REASON FOR ASSESSMENT:  ?Consult ?Assessment of nutrition requirement/status, Enteral/tube feeding initiation and management ? ?ASSESSMENT:  ?Pt with hx EtOH abuse, tobacco use, and spinal stenosis initially presented 2/3 for planned multilevel anterior cervical decompression and fusion surgery after experiencing progressive bilateral upper and lower extremity weakness and spasticity due to critical multilevel cervical spinal stenosis with spinal cord compression and signal change. ? ?2/3 - s/p anterior cervical discectomy with interbody fusion  ?2/5 - pt initially complained of worsening swallowing function ?2/6 - MBS, NPO per SLP ?2/7 - transferred to ICU, s/p re-exploration anterior cervical fusion with evacuation of epidural hematoma ?2/8 - Cortrak tube placed (tip gastric), tube feeds initiated ?2/10 - MBS, diet advanced to dysphagia 2 with nectar-thick liquids, Cortrak removed ?2/19- Intubated after desaturation, possibly from PO intake ?2/20 - s/p cortrak tube; post pyloric  ?2/26 - Extubated ?3/02 - failed MBS ?3/6 - PEG placed ?3/23 - diet advanced to dysphagia 2 with nectar thick liquids s/p MBSS ?3/24 - PEG replaced ?4/28 - Pt removed PEG ? ?Pt resting in bedside chair at the time of assessment. States that he is drinking all boost plus supplements and  continues to have good intake of meals now that he is able to order what he wants. No complaints at this time. Will continue current nutrition plan.  ? ?Pt pleased that he has regained some of his previously lost weight. ? ?Nutritionally Relevant Medications: ?Scheduled Meds: ? baclofen  20 mg Oral TID  ? folic acid  1 mg Oral Daily  ? Boost Plus  237 mL Oral TID WC  ? multivitamin with minerals  1 tablet Oral Daily  ? senna-docusate  1 tablet Oral BID  ? thiamine  100 mg Oral Daily  ? ?PRN Meds: bisacodyl, diphenhydrAMINE, ondansetron, polyethylene glycol, sodium phosphate ? ?Labs Reviewed ? ?Diet Order:   ?Diet Order   ? ?       ?  Diet regular Room service appropriate? Yes with Assist; Fluid consistency: Thin  Diet effective now       ?  ? ?  ?  ? ?  ? ?EDUCATION NEEDS:  ?Education needs have been addressed ? ?Skin:  Skin Assessment: Skin Integrity Issues: ?Skin Integrity Issues:: Other (Comment) ?Other: MASD buttocks ? ?Last BM:  5/8 - type 4 ? ?Height:  ?Ht Readings from Last 1 Encounters:  ?07/15/21 5\' 10"  (1.778 m)  ? ?Weight:  ?Wt Readings from Last 1 Encounters:  ?10/02/21 53.4 kg  ? ?Ideal Body Weight:  78.2 kg ? ?BMI:  Body mass index is 16.89 kg/m?. ? ?Estimated Nutritional Needs:  ?Kcal:  2100-2300 ?Protein:  105-125 grams ?Fluid:  >2L/day ? ? ?Ranell Patrick, RD, LDN ?Clinical Dietitian ?RD pager # available in [place]  ?After hours/weekend pager # available in [place] ?

## 2021-10-02 NOTE — Progress Notes (Signed)
Occupational Therapy Treatment ?Patient Details ?Name: Brian [name] ?MRN: [id number] ?DOB: Oct 10, 1960 ?Today's Date: 10/02/2021 ? ? ?History of present illness Pt is a 61 y/o male admitted 06/29/21 following C3-7 ACDF after progressive cervical myelopathy symptoms including ataxia, bil UE and LE shaking and weakness, and loss of function in his hands with subsequent fall. Pt initially had improvement in neurological symptoms, but postoperative course was complicated by dysphasia and ataxia secondary to and epidural hematoma with evacuation of hematoma on 2/7. Course complicated by delirium secondary to encephalopathy, sepsis secondary to aspiration PNA, reintubated 2/19-2/26 for airway protection, bilat segmental PEs 2/18. PEG placed 3/6. PMHx includes tobacco and EtOH use disorder. ?  ?OT comments ? Patient up in recliner upon entry. Patient ambulated to sink with RW with min assist. Patient was able to perform grooming tasks standing at sink with min assist and min assist for balance. Mobility and transfers performed with RW to increase independence and safety with toilet transfers. Patient performed AROM for BUE while standing to address HEP and standing balance for ADLs. Patient continues to make good progress and is motivated towards therapy. Acute OT to continue to follow.    ? ?Recommendations for follow up therapy are one component of a multi-disciplinary discharge planning process, led by the attending physician.  Recommendations may be updated based on patient status, additional functional criteria and insurance authorization. ?   ?Follow Up Recommendations ? Skilled nursing-short term rehab (<3 hours/day)  ?  ?Assistance Recommended at Discharge Frequent or constant Supervision/Assistance  ?Patient can return home with the following ? Two people to help with walking and/or transfers;Direct supervision/assist for medications management;Assist for transportation;Help with stairs or ramp for  entrance;Assistance with feeding;Two people to help with bathing/dressing/bathroom;Assistance with cooking/housework;Direct supervision/assist for financial management ?  ?Equipment Recommendations ? Wheelchair (measurements OT);Wheelchair cushion (measurements OT);Hospital bed;BSC/3in1;Tub/shower seat  ?  ?Recommendations for Other Services   ? ?  ?Precautions / Restrictions Precautions ?Precautions: Fall  ? ? ?  ? ?Mobility Bed Mobility ?Overal bed mobility: Needs Assistance ?  ?  ?  ?  ?  ?  ?General bed mobility comments: up in recliner upon entry ?  ? ?Transfers ?Overall transfer level: Needs assistance ?Equipment used: Rolling walker (2 wheels) ?Transfers: Sit to/from Stand ?Sit to Stand: Min guard ?  ?  ?Step pivot transfers: Min assist ?  ?  ?General transfer comment: mobility and transfers performed to address toilet transfers ?  ?  ?Balance Overall balance assessment: Needs assistance ?Sitting-balance support: Feet supported ?Sitting balance-Leahy Scale: Fair ?Sitting balance - Comments: up in recliner upon entry ?  ?Standing balance support: Single extremity supported, Bilateral upper extremity supported, During functional activity ?Standing balance-Leahy Scale: Poor ?Standing balance comment: stood at sink for grooming with min assist for balance ?  ?  ?  ?  ?  ?  ?  ?  ?  ?  ?  ?   ? ?ADL either performed or assessed with clinical judgement  ? ?ADL Overall ADL's : Needs assistance/impaired ?  ?  ?Grooming: Wash/dry hands;Wash/dry face;Oral care;Minimal assistance;Standing ?Grooming Details (indicate cue type and reason): assistance with toothpaste and balance standing at sink ?  ?  ?  ?  ?  ?  ?  ?  ?  ?  ?  ?  ?  ?  ?  ?General ADL Comments: improvement with standing balance at sink ?  ? ?Extremity/Trunk Assessment   ?  ?  ?  ?  ?  ? ?  Vision   ?  ?  ?Perception   ?  ?Praxis   ?  ? ?Cognition Arousal/Alertness: Awake/alert ?Behavior During Therapy: [facility] for tasks assessed/performed ?Overall Cognitive  Status: Within Functional Limits for tasks assessed ?  ?  ?  ?  ?  ?  ?  ?  ?  ?  ?  ?  ?  ?  ?  ?  ?  ?  ?  ?   ?Exercises Exercises: General Upper Extremity ?General Exercises - Upper Extremity ?Shoulder Flexion: AROM, Both, 10 reps, Standing ?Shoulder ABduction: AROM, Both, 10 reps, Standing ? ?  ?Shoulder Instructions   ? ? ?  ?General Comments    ? ? ?Pertinent Vitals/ Pain       Pain Assessment ?Pain Assessment: Faces ?Faces Pain Scale: Hurts a little bit ?Pain Location: hands ?Pain Descriptors / Indicators: Sore ?Pain Intervention(s): Monitored during session ? ?Home Living   ?  ?  ?  ?  ?  ?  ?  ?  ?  ?  ?  ?  ?  ?  ?  ?  ?  ?  ? ?  ?Prior Functioning/Environment    ?  ?  ?  ?   ? ?Frequency ? Min 2X/week  ? ? ? ? ?  ?Progress Toward Goals ? ?OT Goals(current goals can now be found in the care plan section) ? Progress towards OT goals: Progressing toward goals ? ?Acute Rehab OT Goals ?Patient Stated Goal: get better ?OT Goal Formulation: With patient ?Time For Goal Achievement: 10/05/21 ?Potential to Achieve Goals: Good ?ADL Goals ?Pt Will Perform Eating: with min assist;with assist to don/doff brace/orthosis;with adaptive utensils;sitting ?Pt Will Perform Grooming: sitting;with min assist ?Pt Will Perform Upper Body Bathing: with mod assist;sitting ?Pt Will Transfer to Toilet: with min assist;stand pivot transfer;bedside commode ?Pt/caregiver will Perform Home Exercise Program: Increased ROM;Increased strength;Both right and left upper extremity;With theraputty;With minimal assist;With written HEP provided ?Additional ADL Goal #1: Pt will grasp objects with R hand and bring to mouth 10/10 trials in prep for self feeding ?Additional ADL Goal #2: Pt will grasp/release 5/5 objects with L hand to increase indep with ADLs  ?Plan Discharge plan remains appropriate   ? ?Co-evaluation ? ? ?   ?  ?  ?  ?  ? ?  ?AM-PAC OT "6 Clicks" Daily Activity     ?Outcome Measure ? ? Help from another person eating meals?: A  Lot ?Help from another person taking care of personal grooming?: A Little ?Help from another person toileting, which includes using toliet, bedpan, or urinal?: A Lot ?Help from another person bathing (including washing, rinsing, drying)?: A Lot ?Help from another person to put on and taking off regular upper body clothing?: A Little ?Help from another person to put on and taking off regular lower body clothing?: A Lot ?6 Click Score: 14 ? ?  ?End of Session Equipment Utilized During Treatment: Gait belt;Rolling walker (2 wheels) ? ?OT Visit Diagnosis: Other abnormalities of gait and mobility (R26.89);Muscle weakness (generalized) (M62.81);Feeding difficulties (R63.3);Other symptoms and signs involving the nervous system (R29.898);Other symptoms and signs involving cognitive function ?  ?Activity Tolerance Patient tolerated treatment well ?  ?Patient Left in chair;with call bell/phone within reach;with chair alarm set;with family/visitor present ?  ?Nurse Communication Mobility status ?  ? ?   ? ?Time: [id number] ?OT Time Calculation (min): 31 min ? ?Charges: OT General Charges ?$OT Visit: 1 Visit ?OT Treatments ?$Self Care/Home Management :  8-22 mins ?$Therapeutic Activity: 8-22 mins ? ?Lodema Hong, OTA ?Acute Rehabilitation Services  ?Pager [phone] ?Office [phone] ? ? ?Trixie Dredge ?10/02/2021, 2:24 PM ?

## 2021-10-03 DIAGNOSIS — E43 Unspecified severe protein-calorie malnutrition: Secondary | ICD-10-CM | POA: Diagnosis not present

## 2021-10-03 DIAGNOSIS — R000 Tachycardia, unspecified: Secondary | ICD-10-CM | POA: Diagnosis not present

## 2021-10-03 NOTE — Progress Notes (Signed)
Physical Therapy Treatment ?Patient Details ?Name: Brian [name] ?MRN: [id number] ?DOB: 05/28/1960 ?Today's Date: 10/03/2021 ? ? ?History of Present Illness Pt is a 61 y/o male admitted 06/29/21 following C3-7 ACDF after progressive cervical myelopathy symptoms including ataxia, bil UE and LE shaking and weakness, and loss of function in his hands with subsequent fall. Pt initially had improvement in neurological symptoms, but postoperative course was complicated by dysphasia and ataxia secondary to and epidural hematoma with evacuation of hematoma on 2/7. Course complicated by delirium secondary to encephalopathy, sepsis secondary to aspiration PNA, reintubated 2/19-2/26 for airway protection, bilat segmental PEs 2/18. PEG placed 3/6. PMHx includes tobacco and EtOH use disorder. ? ?  ?PT Comments  ? ? Pt appeared to really enjoy his time outside and was likely very therapeutic.  Emphasis otherwise on gait stability pushing the W/C and maintaining balance using the rail while the w/c was switched out.   ?Recommendations for follow up therapy are one component of a multi-disciplinary discharge planning process, led by the attending physician.  Recommendations may be updated based on patient status, additional functional criteria and insurance authorization. ? ?Follow Up Recommendations ? Acute inpatient rehab (3hours/day) ?  ?  ?Assistance Recommended at Discharge Frequent or constant Supervision/Assistance  ?Patient can return home with the following A little help with walking and/or transfers;A little help with bathing/dressing/bathroom;Assistance with cooking/housework;Assist for transportation;Help with stairs or ramp for entrance ?  ?Equipment Recommendations ? Rolling walker (2 wheels);Wheelchair (measurements PT);Wheelchair cushion (measurements PT)  ?  ?Recommendations for Other Services Rehab consult ? ? ?  ?Precautions / Restrictions Precautions ?Precautions: Fall  ?  ? ?Mobility ? Bed Mobility ?  ?  ?  ?  ?   ?  ?  ?General bed mobility comments: sitting EOB on arrival ?  ? ?Transfers ?Overall transfer level: Needs assistance ?Equipment used:  (wc) ?Transfers: Sit to/from Stand ?Sit to Stand: Min guard ?  ?  ?  ?  ?  ?  ?  ? ?Ambulation/Gait ?Ambulation/Gait assistance: Min guard ?Gait Distance (Feet): 100 Feet (x2) ?Assistive device:  (w/c to push and to transport outside for some "fresh air") ?Gait Pattern/deviations: Step-through pattern ?  ?Gait velocity interpretation: <1.8 ft/sec, indicate of risk for recurrent falls ?  ?General Gait Details: generally ataxic, but held in control and function as long as speed is controlled.  Pt found it more difficult to maneuver and control the speed of the w/c. ? ? ?Stairs ?  ?  ?  ?  ?  ? ? ?Wheelchair Mobility ?  ? ?Modified Rankin (Stroke Patients Only) ?  ? ? ?  ?Balance   ?  ?Sitting balance-Leahy Scale: Fair ?  ?  ?  ?Standing balance-Leahy Scale: Poor ?Standing balance comment: pt able to stand at the rail hold with 1 hand while w/c from front to back to front dependent on pushing or riding. ?  ?  ?  ?  ?  ?  ?  ?  ?  ?  ?  ?  ? ?  ?Cognition Arousal/Alertness: Awake/alert ?Behavior During Therapy: [facility] for tasks assessed/performed ?Overall Cognitive Status: Within Functional Limits for tasks assessed ?  ?  ?  ?  ?  ?  ?  ?  ?  ?  ?  ?  ?  ?  ?  ?  ?  ?  ?  ? ?  ?Exercises   ? ?  ?General Comments   ?  ?  ? ?Pertinent  Vitals/Pain Pain Assessment ?Pain Assessment: Faces ?Faces Pain Scale: No hurt ?Pain Intervention(s): Monitored during session  ? ? ?Home Living   ?  ?  ?  ?  ?  ?  ?  ?  ?  ?   ?  ?Prior Function    ?  ?  ?   ? ?PT Goals (current goals can now be found in the care plan section) Acute Rehab PT Goals ?Patient Stated Goal: to walk more.  To get home. ?PT Goal Formulation: With patient ?Time For Goal Achievement: 10/15/21 ?Potential to Achieve Goals: Good ?Progress towards PT goals: Progressing toward goals ? ?  ?Frequency ? ? ? Min 5X/week ? ? ? ?  ?PT Plan  Current plan remains appropriate  ? ? ?Co-evaluation   ?  ?  ?  ?  ? ?  ?AM-PAC PT "6 Clicks" Mobility   ?Outcome Measure ? Help needed turning from your back to your side while in a flat bed without using bedrails?: A Little ?Help needed moving from lying on your back to sitting on the side of a flat bed without using bedrails?: A Little ?Help needed moving to and from a bed to a chair (including a wheelchair)?: A Little ?Help needed standing up from a chair using your arms (e.g., wheelchair or bedside chair)?: A Little ?Help needed to walk in hospital room?: A Little ?Help needed climbing 3-5 steps with a railing? : A Little ?6 Click Score: 18 ? ?  ?End of Session   ?Activity Tolerance: Patient tolerated treatment well ?Patient left: in chair;with call bell/phone within reach;with chair alarm set ?Nurse Communication: Mobility status ?PT Visit Diagnosis: Other abnormalities of gait and mobility (R26.89);Other symptoms and signs involving the nervous system (R29.898) ?  ? ? ?Time: [id number] ?PT Time Calculation (min) (ACUTE ONLY): 23 min ? ?Charges:  $Gait Training: 8-22 mins ?$Therapeutic Activity: 8-22 mins          ?          ? ?10/03/2021 ? ?Ginger Carne., PT ?Acute Rehabilitation Services ?[phone]  (pager) ?[phone]  (office) ? ? ?[name] Liesl Simons ?10/03/2021, 6:18 PM ? ?

## 2021-10-03 NOTE — Progress Notes (Signed)
? ?PROGRESS NOTE ? ? ? ?Brian [name]  [id number] DOB: July 20, 1960 DOA: 06/29/2021 ?PCP: Pcp, No ? ?Primary service: Neurosurgery ? ?Brief Narrative: ?Brian [name] is a 61 year old male with past medical history significant for tobacco and EtOH use disorder who was admitted by neurosurgery for cervical myelopathy due to critical multilevel cervical spinal canal stenosis C3-6 with spinal cord compression and spinal cord signal change after recent fall with progressive upper and lower extremity weakness and tremors. ?Pt transferred to the floor on 3/2 and PCCM requested transition to [facility] for medical assistance while patient remains under the neurosurgery service. ? ?2/03 ACDF C3-4, C4-5, C5-5, and C6-7 w/ Dr. Annette Stable ?2/07 Code stroke overnight, neg for LVO, CT showing soft tissue swelling. PCCM consulted for concern of airway management and AMS. Went for evacuation of epidural hematoma  ?2/18 CT Abd/Pelvis: showing bilateral segmental Pes, started on heparin and showing worsening pneumonia, febrile to  103.  PCT rising, 34.6, restarted on abx ?2/19 possible aspiration w/ severe hypoxia; intubated; Switching from heparin to angiomax given subtherapeutic levels despite increase in rate. ?2/23 Ketamine infusion added ?2/26 extubated ?3/02 Modified barium swallow, continues n.p.o. with core track in place,  ?3/06 s/p IR G-tube placement ?3/24 E. coli UTI/bacteremia>> cefazolin x7 days per ID ?4/14 repeat urine culture + Citrobacter Freundii that is Sensitive to Bactrim,  ?4/16 reinserted foley due to recurrent urinary retention ?4/28 accidentally pulled out PEG tube the evening.   ?Tolerating oral diet with 3 times daily Premier protein shake ?Medically stable.  Awaiting placement/improvement with mobility to be discharged home. ? ? ?Assessment and Plan: ?Cervical myelopathy ([place]); severe cervical spinal stenosis with cord compression s/p ACDF [id number] on 2/3, complicated by epidural hematoma s/p evacuation  2/7 ?Awaiting SNF placement per TOC ? ?Acute respiratory failure with hypoxia ([place]) ?Likely Multifactorial with significant dysphagia and recurrent aspiration pneumonia events during initial hospitalization following ACDF surgery with postoperative complications of postoperative cervical hematoma. Patient did require ventilatory support in the intensive care unit and was successfully extubated on 07/22/2021.  Patient completed 5 days of Vancomycin/Cefepime > Vancomycin Zosyn for aspiration pneumonia. Now on room air.  ? ?Acute pulmonary embolism ([place]) ?CTA chest (2/7) showed bilateral segmental PE. LE [place] negative for DVT.  TTE with normal LV systolic function.  Supplemental oxygen weaned off. ?-Continue Eliquis 5 mg twice daily ? ?Acute metabolic encephalopathy ?CT head, MRI brain, EEG, TSH, B12, ammonia unrevealing.  Etiology likely multifactorial with acute respiratory failure secondary to aspiration pneumonia, bilateral pulmonary embolism.  Resolved. ? ?E. coli bacteremia ?Urine culture and blood cultures x2 08/17/2021 positive for E. coli. Completed 7-day course of Ceftriaxone > Cefazolin. ? ?Severe sepsis due to recurrent aspiration pneumonia ?Treated with Vancomycin/Cefepime and transitioned to Vancomycin/Zosyn. Completed a 5 day treatment course.  ?-Continue aspiration precautions ? ?Sinus tachycardia ?Multifactorial including sepsis, dehydration, PE, agitation. Patient started on metoprolol. Tachycardia now resolved. Metoprolol weaned down to 12.5 mg BID dosing. ?-Discontinue Metoprolol and watch heart rates ? ?Elevated liver enzymes ?Etiology likely secondary to sepsis from aspiration pneumonia as above.  Acute hepatitis panel negative.  RUQ ultrasound unremarkable. Resolved. ? ?Hyponatremia ?Etiology likely secondary to dehydration. Mild. Improved with good nutritional intake ? ?Tobacco use disorder ?Counseled on need for complete cessation. Managed on nicotine patch which is now discontinued. ? ?Physical  deconditioning ?Significant weakness in all extremities as a result of cervical myelopathy and acute illness.  Slowly improving while inpatient with physical therapy. Recommendation for SNF on discharge. ?-Continue PT/OT ? ?Urinary  retention ?Foley catheter discontinued on 08/09/2021; replaced on 09/09/2021 for recurrent urinary retention and removed again on 4/25. Resolved. ?-Continue tamsulosin and finasteride ? ?Protein-calorie malnutrition, severe (HCC) ?Dietitian recommendations (5/9): ?Continue current diet as ordered, encourage PO intake ?Boost Plus TID to provide 360kcal and 14g of protein per carton ?Continue vitamin regimen ? ?Vomiting ?In setting of tube feeds. Now resolved. ? ?Citrobacter infection ?Noted on urine culture (4/14). Patient treated empirically with Ceftriaxone IV. Urine culture showed Ceftriaxone resistant strain. Ceftriaxone transitioned to Bactrim to complete a 7 day course of antibiotic therapy. ? ?Thrombocytosis ?Likely Reactive. Now resolved. ? ?Normocytic anemia ?Chronic and stable. Patient did require 1 unit of pRBC for acute anemia while in ICU, but unsure of etiology. ? ?Fall during current hospitalization ?No injury. Patient with recurrent falls. ?-Continue fall precautions.   ? ?Lumbar back pain ?-Per primary ? ?Hyperglycemia-resolved as of 07/12/2021 ?Likely due to Steroids. Hemoglobin A1C of 5.2%. Resolved. ? ? ?DVT prophylaxis: Eliquis ?Code Status:   Code Status: Full Code ?Family Communication: None at bedside ?Disposition Plan: SNF when bed is available/per primary ? ? ?Consultants:  ?PCCM ?Infectious disease ?Neurology ?Palliative care ?General medicine ? ?Procedures:  ?ACDF C3-7, neurosurgery Dr. Dutch Quint 2/3 ?Hematoma evacuation 2/7 ?Vascular duplex ultrasound bilateral lower extremities 2/19 ?TTE 2/20 ?TTE 3/1 ?IR G-tube placement 3/6, 3/24 ? ?Antimicrobials: ?Vancomycin 2/7 - 2/8; 2/18 - 2/21 ?Zosyn 2/21 -2/23 ?Ampicillin 2/7 -2/11 ?Metronidazole 2/7 - 2/8; 2/17  -2/18 ?Cefepime 2/18 - 2/21 ?Unasyn 3/24 - 3/24 ?Perioperative cefazolin ?Ceftriaxone 3/25 - 3/27 ?Cefazolin 3/27 - 4/1  ? ? ?Subjective: ?Patient reports no concerns today. ? ?Objective: ?BP 100/73 (BP Location: Left Arm)   Pulse 83   Temp 97.8 ?F (36.6 ?C) (Oral)   Resp 14   Ht 5\' 10"  (1.778 m)   Wt 53.4 kg   SpO2 100%   BMI 16.89 kg/m?  ? ?Examination: ? ?General exam: Appears calm and comfortable ?Respiratory system: Clear to auscultation. Respiratory effort normal. ?Cardiovascular system: S1 & S2 heard, RRR. ?Gastrointestinal system: Abdomen is nondistended, soft and nontender. Normal bowel sounds heard. ?Central nervous system: Alert and oriented. ?Musculoskeletal: No edema. No calf tenderness ? ? ?Data Reviewed: I have personally reviewed following labs and imaging studies ? ?CBC ?Lab Results  ?Component Value Date  ? WBC 6.5 09/24/2021  ? RBC 3.28 (L) 09/24/2021  ? HGB 10.1 (L) 09/24/2021  ? HCT 30.9 (L) 09/24/2021  ? MCV 94.2 09/24/2021  ? MCH 30.8 09/24/2021  ? PLT 373 09/24/2021  ? MCHC 32.7 09/24/2021  ? RDW 16.3 (H) 09/24/2021  ? LYMPHSABS 3.2 09/21/2021  ? MONOABS 0.6 09/21/2021  ? EOSABS 0.1 09/21/2021  ? BASOSABS 0.0 09/21/2021  ? ? ? ?Last metabolic panel ?Lab Results  ?Component Value Date  ? NA 135 09/24/2021  ? K 4.0 09/24/2021  ? CL 99 09/24/2021  ? CO2 25 09/24/2021  ? BUN 29 (H) 09/24/2021  ? CREATININE 0.76 09/24/2021  ? GLUCOSE 97 09/24/2021  ? GFRNONAA >60 09/24/2021  ? CALCIUM 10.4 (H) 09/24/2021  ? PHOS 4.5 09/24/2021  ? PROT 7.6 09/18/2021  ? ALBUMIN 3.2 (L) 09/20/2021  ? BILITOT 0.5 09/18/2021  ? ALKPHOS 110 09/18/2021  ? AST 28 09/18/2021  ? ALT 19 09/18/2021  ? ANIONGAP 11 09/24/2021  ? ? ?GFR: ?Estimated Creatinine Clearance: 74.2 mL/min (by C-G formula based on SCr of 0.76 mg/dL). ? ?No results found for this or any previous visit (from the past 240 hour(s)).  ? ? ?Radiology Studies: ?No  results found. ? ? ? LOS: 96 days  ? ? ?Jacquelin Hawking, MD ?[facility] ?10/03/2021,  11:11 AM ? ? ?If 7PM-7AM, please contact night-coverage ?[email or web address] ? ?

## 2021-10-03 NOTE — Progress Notes (Signed)
? ?  Providing Compassionate, Quality Care - Together ? ?Hospital course: ?Patient status post C3-4, C4-5, C5-6, C6-7 anterior cervical discectomy with interbody fusion by Dr. Annette Stable on 06/29/2021. Increased difficulty swallowing with lung atelectasis and elevated temperatures on 07/02/2021. Made NPO following MBS by SLP. 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 CTA head and neck negative for stroke. MRI revealed epidural hematoma. Patient underwent exploration of his cervical fusion with evacuation of the epidural hematoma on 07/03/2021. Cortrak was placed on 07/04/2021. Patient's strength much improved since epidural hematoma evacuation. Cortrak removed 07/06/2021 and dysphagia 2 diet with nectar thick liquids started. Patient with delirium vs ETOH withdrawal. Discontinued steroids 07/06/2021. CIWA protocol started and discontinued 07/07/2021. CT and MRI 07/07/2021 negative. Patient developed respiratory distress, requiring intubation on 07/15/2021. Work up revealed aspiration PNA and PE. Patient was extubated on 07/22/2021. Foley catheter removed 07/24/2021. Patient transferred to 3W on 07/26/2021. Foley replaced 07/28/2021. PEG placed 07/30/2021. Foley removed 08/09/2021. PEG pulled out by patient 08/16/2021. Delirium worsening 08/17/2021. Haldol administered. IR replaced PEG on 08/17/2021. Patient with urosepsis 08/18/2021. Culture grew E. Coli. C/o LBP 08/28/2021. Lumbar MRI demonstrated chronic degenerative changes with spinal stenosis at L3-4, L4-5, and L5-S1. Patient suffered a fall overnight 08/29/2021 without injury. Worsening muscle spasms 09/11/2021, baclofen started. Patient transitioned to regular diet 09/12/2021. Episode of emesis overnight 4/19-4/20. No further episodes of emesis since 4/20. KUB 4/20 just demonstrated a nonobstructive bowel gas pattern. Fall without injury reported 09/16/2021. Foley catheter removed again. Another fall 09/19/2021. No injury per report, but per day  nurse c/o left lower back pain. Episode of emesis in the morning on 09/20/2021. PEG pulled out 09/21/2021. PEG remains out. ? ?Subjective: ?Patient reports no issues overnight. He would like to go outside with therapies if possible. ? ?Objective: ?Vital signs in last 24 hours: ?Temp:  [97.8 ?F (36.6 ?C)-98.6 ?F (37 ?C)] 98.6 ?F (37 ?C) (05/10 1114) ?Pulse Rate:  [78-102] 85 (05/10 1114) ?Resp:  [14-20] 20 (05/10 1114) ?BP: (95-116)/(69-80) 95/73 (05/10 1114) ?SpO2:  [100 %] 100 % (05/10 1114) ? ?Intake/Output from previous day: ?05/09 0701 - 05/10 0700 ?In: 240 [P.O.:240] ?Out: 1000 [Urine:1000] ?Intake/Output this shift: ?Total I/O ?In: 240 [P.O.:240] ?Out: 350 [Urine:350] ? ?  ?Alert; oriented to person, place, and time ?MAE, Generalized weakness ?Decreased fine motor BUE ?Incision is well-healed ? ?Lab Results: ?No results for input(s): WBC, HGB, HCT, PLT in the last 72 hours. ?BMET ?No results for input(s): NA, K, CL, CO2, GLUCOSE, BUN, CREATININE, CALCIUM in the last 72 hours. ? ?Studies/Results: ?No results found. ? ?Assessment/Plan: ?Patient is doing well after a very complicated hospital course following four level ACDF. He would like to discharge home. Continue efforts at mobilization. ?  ? ? LOS: 96 days  ? ? ? ? ?Viona Gilmore, DNP, AGNP-C ?Nurse Practitioner ? ?[facility] ?[address], [place], [place] [address] ?[id number]    [id number] ? ?10/03/2021, 1:50 PM ? ? ? ? ?

## 2021-10-04 NOTE — Progress Notes (Signed)
Physical Therapy Treatment ?Patient Details ?Name: Brian [name] ?MRN: [id number] ?DOB: [birth year] ?Today's Date: 10/04/2021 ? ? ?History of Present Illness Pt is a 61 y/o male admitted 06/29/21 following C3-7 ACDF after progressive cervical myelopathy symptoms including ataxia, bil UE and LE shaking and weakness, and loss of function in his hands with subsequent fall. Pt initially had improvement in neurological symptoms, but postoperative course was complicated by dysphasia and ataxia secondary to and epidural hematoma with evacuation of hematoma on 2/7. Course complicated by delirium secondary to encephalopathy, sepsis secondary to aspiration PNA, reintubated 2/19-2/26 for airway protection, bilat segmental PEs 2/18. PEG placed 3/6. PMHx includes tobacco and EtOH use disorder. ? ?  ?PT Comments  ? ? Pt was seen for progression of mobility on RW, with help to keep his postural balance with cues for speed of gait and to maintain control of the walker.  Pt was aware of his speed on the walker, but does not see it as a safety concern.  Has tendency to lean too far forward, but was verbally able to be controlled to get the entire walk done at once.  Will recommend to continue with strengthening and gait, with more work on initiating STS next time.  Have pt continue to use his walker with staff help to increase endurance and proximal strength.  CIR was recommended due to the depth of deficits and need to put more time into therapy, both as pt tolerates and as he will need for balance and strength.  ?Recommendations for follow up therapy are one component of a multi-disciplinary discharge planning process, led by the attending physician.  Recommendations may be updated based on patient status, additional functional criteria and insurance authorization. ? ?Follow Up Recommendations ? Acute inpatient rehab (3hours/day) ?  ?  ?Assistance Recommended at Discharge Frequent or constant Supervision/Assistance  ?Patient can  return home with the following A little help with walking and/or transfers;A little help with bathing/dressing/bathroom;Assistance with cooking/housework;Assist for transportation;Help with stairs or ramp for entrance ?  ?Equipment Recommendations ? Rolling walker (2 wheels);Wheelchair (measurements PT);Wheelchair cushion (measurements PT)  ?  ?Recommendations for Other Services Rehab consult ? ? ?  ?Precautions / Restrictions Precautions ?Precautions: Fall ?Restrictions ?Weight Bearing Restrictions: No  ?  ? ?Mobility ? Bed Mobility ?  ?  ?  ?  ?  ?  ?  ?General bed mobility comments: in chair when PT arrived ?  ? ?Transfers ?Overall transfer level: Needs assistance ?Equipment used: Rolling walker (2 wheels) ?Transfers: Sit to/from Stand ?Sit to Stand: Min guard ?  ?  ?  ?  ?  ?  ?  ? ?Ambulation/Gait ?Ambulation/Gait assistance: Min guard ?Gait Distance (Feet): 175 Feet ?Assistive device: Rolling walker (2 wheels) ?Gait Pattern/deviations: Step-through pattern, Trunk flexed, Wide base of support ?Gait velocity: requires cues to slow down ?  ?Pre-gait activities: pt is not very receptive to standing balance cues ?General Gait Details: has cued speed control for turns and forward walking, tends to get going faster and cannot control upright posture as well ? ? ?Stairs ?  ?  ?  ?  ?  ? ? ?Wheelchair Mobility ?  ? ?Modified Rankin (Stroke Patients Only) ?  ? ? ?  ?Balance Overall balance assessment: Needs assistance ?Sitting-balance support: Feet supported ?Sitting balance-Leahy Scale: Fair ?Sitting balance - Comments: pt is unable to control sitting beyond this to steady for LE strengthening ?Postural control: Posterior lean ?Standing balance support: Bilateral upper extremity supported, During functional activity ?Standing  balance-Leahy Scale: Poor ?  ?  ?  ?  ?  ?  ?  ?  ?  ?  ?  ?  ?  ? ?  ?Cognition Arousal/Alertness: Awake/alert ?Behavior During Therapy: [facility] for tasks assessed/performed ?Overall Cognitive  Status: Within Functional Limits for tasks assessed ?  ?  ?  ?  ?  ?  ?  ?  ?  ?  ?  ?  ?  ?  ?  ?  ?General Comments: pt is discouraging of PT bringing a chair to follow him on RW, but pt is unsteady and does not appear to see this ?  ?  ? ?  ?Exercises General Exercises - Lower Extremity ?Ankle Circles/Pumps: AAROM, 5 reps ?Long Arc Quad: Strengthening, 10 reps ?Heel Slides: Strengthening, 10 reps ?Hip ABduction/ADduction: AROM, AAROM, 10 reps ? ?  ?General Comments General comments (skin integrity, edema, etc.): has required assist to cue safety and balance with RW and close guard of chair.  Pt is not in agreement that a chair needs to follow him, was initially very unhappy that PT could not carry him outside ?  ?  ? ?Pertinent Vitals/Pain Pain Assessment ?Pain Assessment: Faces ?Faces Pain Scale: No hurt ?Pain Intervention(s): Monitored during session, Repositioned  ? ? ?Home Living   ?  ?  ?  ?  ?  ?  ?  ?  ?  ?   ?  ?Prior Function    ?  ?  ?   ? ?PT Goals (current goals can now be found in the care plan section) Acute Rehab PT Goals ?Patient Stated Goal: to walk more.  To get home. ?Progress towards PT goals: Progressing toward goals ? ?  ?Frequency ? ? ? Min 5X/week ? ? ? ?  ?PT Plan Current plan remains appropriate  ? ? ?Co-evaluation   ?  ?  ?  ?  ? ?  ?AM-PAC PT "6 Clicks" Mobility   ?Outcome Measure ? Help needed turning from your back to your side while in a flat bed without using bedrails?: A Little ?Help needed moving from lying on your back to sitting on the side of a flat bed without using bedrails?: A Little ?Help needed moving to and from a bed to a chair (including a wheelchair)?: A Little ?Help needed standing up from a chair using your arms (e.g., wheelchair or bedside chair)?: A Little ?Help needed to walk in hospital room?: A Little ?Help needed climbing 3-5 steps with a railing? : A Little ?6 Click Score: 18 ? ?  ?End of Session Equipment Utilized During Treatment: Gait belt ?Activity  Tolerance: Patient tolerated treatment well ?Patient left: in chair;with call bell/phone within reach;with chair alarm set ?Nurse Communication: Mobility status ?PT Visit Diagnosis: Other abnormalities of gait and mobility (R26.89);Other symptoms and signs involving the nervous system (R29.898) ?  ? ? ?Time: [id number] ?PT Time Calculation (min) (ACUTE ONLY): 25 min ? ?Charges:  $Gait Training: 8-22 mins ?$Therapeutic Exercise: 8-22 mins         ?Ramond Dial ?10/04/2021, 3:30 PM ? ?Mee Hives, PT PhD ?Acute Rehab Dept. Number: [facility] [id number] and [place] [phone] ? ? ?

## 2021-10-04 NOTE — Progress Notes (Signed)
? ?PROGRESS NOTE ? ? ? ?Brian [name]  [id number] DOB: July 20, 1960 DOA: 06/29/2021 ?PCP: Pcp, No ? ?Primary service: Neurosurgery ? ?Brief Narrative: ?Brian [name] is a 61 year old male with past medical history significant for tobacco and EtOH use disorder who was admitted by neurosurgery for cervical myelopathy due to critical multilevel cervical spinal canal stenosis C3-6 with spinal cord compression and spinal cord signal change after recent fall with progressive upper and lower extremity weakness and tremors. ?Pt transferred to the floor on 3/2 and PCCM requested transition to [facility] for medical assistance while patient remains under the neurosurgery service. ? ?2/03 ACDF C3-4, C4-5, C5-5, and C6-7 w/ Brian [name] ?2/07 Code stroke overnight, neg for LVO, CT showing soft tissue swelling. PCCM consulted for concern of airway management and AMS. Went for evacuation of epidural hematoma  ?2/18 CT Abd/Pelvis: showing bilateral segmental Pes, started on heparin and showing worsening pneumonia, febrile to  103.  PCT rising, 34.6, restarted on abx ?2/19 possible aspiration w/ severe hypoxia; intubated; Switching from heparin to angiomax given subtherapeutic levels despite increase in rate. ?2/23 Ketamine infusion added ?2/26 extubated ?3/02 Modified barium swallow, continues n.p.o. with core track in place,  ?3/06 s/p IR G-tube placement ?3/24 E. coli UTI/bacteremia>> cefazolin x7 days per ID ?4/14 repeat urine culture + Citrobacter Freundii that is Sensitive to Bactrim,  ?4/16 reinserted foley due to recurrent urinary retention ?4/28 accidentally pulled out PEG tube the evening.   ?Tolerating oral diet with 3 times daily Premier protein shake ?Medically [name].  Awaiting placement/improvement with mobility to be discharged home. ? ? ?Assessment and Plan: ?Cervical myelopathy ([place]); severe cervical spinal stenosis with cord compression s/p ACDF [id number] on 2/3, complicated by epidural hematoma s/p evacuation  2/7 ?Awaiting SNF placement per TOC ? ?Acute respiratory failure with hypoxia ([place]) ?Likely Multifactorial with significant dysphagia and recurrent aspiration pneumonia events during initial hospitalization following ACDF surgery with postoperative complications of postoperative cervical hematoma. Patient did require ventilatory support in the intensive care unit and was successfully extubated on 07/22/2021.  Patient completed 5 days of Vancomycin/Cefepime > Vancomycin Zosyn for aspiration pneumonia. Now on room air.  ? ?Acute pulmonary embolism ([place]) ?CTA chest (2/7) showed bilateral segmental PE. LE [place] negative for DVT.  TTE with normal LV systolic function.  Supplemental oxygen weaned off. ?-Continue Eliquis 5 mg twice daily ? ?Acute metabolic encephalopathy ?CT head, MRI brain, EEG, TSH, B12, ammonia unrevealing.  Etiology likely multifactorial with acute respiratory failure secondary to aspiration pneumonia, bilateral pulmonary embolism.  Resolved. ? ?E. coli bacteremia ?Urine culture and blood cultures x2 08/17/2021 positive for E. coli. Completed 7-day course of Ceftriaxone > Cefazolin. ? ?Severe sepsis due to recurrent aspiration pneumonia ?Treated with Vancomycin/Cefepime and transitioned to Vancomycin/Zosyn. Completed a 5 day treatment course.  ?-Continue aspiration precautions ? ?Sinus tachycardia ?Multifactorial including sepsis, dehydration, PE, agitation. Patient started on metoprolol. Tachycardia now resolved. Metoprolol weaned down to 12.5 mg BID dosing. ?-Discontinue Metoprolol and watch heart rates ? ?Elevated liver enzymes ?Etiology likely secondary to sepsis from aspiration pneumonia as above.  Acute hepatitis panel negative.  RUQ ultrasound unremarkable. Resolved. ? ?Hyponatremia ?Etiology likely secondary to dehydration. Mild. Improved with good nutritional intake ? ?Tobacco use disorder ?Counseled on need for complete cessation. Managed on nicotine patch which is now discontinued. ? ?Physical  deconditioning ?Significant weakness in all extremities as a result of cervical myelopathy and acute illness.  Slowly improving while inpatient with physical therapy. Recommendation for SNF on discharge. ?-Continue PT/OT ? ?Urinary  retention ?Foley catheter discontinued on 08/09/2021; replaced on 09/09/2021 for recurrent urinary retention and removed again on 4/25. Resolved. ?-Continue tamsulosin and finasteride ? ?Protein-calorie malnutrition, severe (HCC) ?Dietitian recommendations (5/9): ?Continue current diet as ordered, encourage PO intake ?Boost Plus TID to provide 360kcal and 14g of protein per carton ?Continue vitamin regimen ? ?Vomiting ?In setting of tube feeds. Now resolved. ? ?Citrobacter infection ?Noted on urine culture (4/14). Patient treated empirically with Ceftriaxone IV. Urine culture showed Ceftriaxone resistant strain. Ceftriaxone transitioned to Bactrim to complete a 7 day course of antibiotic therapy. ? ?Thrombocytosis ?Likely Reactive. Now resolved. ? ?Normocytic anemia ?Chronic and [name]. Patient did require 1 unit of pRBC for acute anemia while in ICU, but unsure of etiology. ? ?Fall during current hospitalization ?No injury. Patient with recurrent falls. ?-Continue fall precautions.   ? ?Lumbar back pain ?-Per primary ? ?Hyperglycemia-resolved as of 07/12/2021 ?Likely due to Steroids. Hemoglobin A1C of 5.2%. Resolved. ? ? ?DVT prophylaxis: Eliquis ?Code Status:   Code Status: Full Code ?Family Communication: None at bedside ?Disposition Plan: SNF when bed is available/per primary ? ? ?Consultants:  ?PCCM ?Infectious disease ?Neurology ?Palliative care ?General medicine ? ?Procedures:  ?ACDF C3-7, neurosurgery Dr. Dutch Quint 2/3 ?Hematoma evacuation 2/7 ?Vascular duplex ultrasound bilateral lower extremities 2/19 ?TTE 2/20 ?TTE 3/1 ?IR G-tube placement 3/6, 3/24 ? ?Antimicrobials: ?Vancomycin 2/7 - 2/8; 2/18 - 2/21 ?Zosyn 2/21 -2/23 ?Ampicillin 2/7 -2/11 ?Metronidazole 2/7 - 2/8; 2/17  -2/18 ?Cefepime 2/18 - 2/21 ?Unasyn 3/24 - 3/24 ?Perioperative cefazolin ?Ceftriaxone 3/25 - 3/27 ?Cefazolin 3/27 - 4/1  ? ? ?Subjective: ?Patient reports no issues. States he's ready to go home. Feels much stronger. States he has support from his son, in addition to his neighbors when his son is unavailable and at work. ? ?Objective: ?BP 99/74 (BP Location: Left Arm)   Pulse 98   Temp 98.1 ?F (36.7 ?C) (Oral)   Resp 16   Ht 5\' 10"  (1.778 m)   Wt 53.4 kg   SpO2 100%   BMI 16.89 kg/m?  ? ?Examination: ? ?General exam: Appears calm and comfortable ? ? ?Data Reviewed: I have personally reviewed following labs and imaging studies ? ?CBC ?Lab Results  ?Component Value Date  ? WBC 6.5 09/24/2021  ? RBC 3.28 (L) 09/24/2021  ? HGB 10.1 (L) 09/24/2021  ? HCT 30.9 (L) 09/24/2021  ? MCV 94.2 09/24/2021  ? MCH 30.8 09/24/2021  ? PLT 373 09/24/2021  ? MCHC 32.7 09/24/2021  ? RDW 16.3 (H) 09/24/2021  ? LYMPHSABS 3.2 09/21/2021  ? MONOABS 0.6 09/21/2021  ? EOSABS 0.1 09/21/2021  ? BASOSABS 0.0 09/21/2021  ? ? ? ?Last metabolic panel ?Lab Results  ?Component Value Date  ? NA 135 09/24/2021  ? K 4.0 09/24/2021  ? CL 99 09/24/2021  ? CO2 25 09/24/2021  ? BUN 29 (H) 09/24/2021  ? CREATININE 0.76 09/24/2021  ? GLUCOSE 97 09/24/2021  ? GFRNONAA >60 09/24/2021  ? CALCIUM 10.4 (H) 09/24/2021  ? PHOS 4.5 09/24/2021  ? PROT 7.6 09/18/2021  ? ALBUMIN 3.2 (L) 09/20/2021  ? BILITOT 0.5 09/18/2021  ? ALKPHOS 110 09/18/2021  ? AST 28 09/18/2021  ? ALT 19 09/18/2021  ? ANIONGAP 11 09/24/2021  ? ? ?GFR: ?Estimated Creatinine Clearance: 74.2 mL/min (by C-G formula based on SCr of 0.76 mg/dL). ? ?No results found for this or any previous visit (from the past 240 hour(s)).  ? ? ?Radiology Studies: ?No results found. ? ? ? LOS: 97 days  ? ? ?  Jacquelin Hawking, MD ?[facility] ?10/04/2021, 7:39 AM ? ? ?If 7PM-7AM, please contact night-coverage [email or web address] ? ?

## 2021-10-04 NOTE — Progress Notes (Signed)
.    Overall about the same.  No complaints of pain.  Patient working to become more independent using his walker.  Spirits are good.  No other problems.  Continue supportive efforts.  Hopefully patient will be at a functional status where he can go home soon. ?

## 2021-10-04 NOTE — Progress Notes (Signed)
Occupational Therapy Treatment ?Patient Details ?Name: Brian [name] ?MRN: [id number] ?DOB: December 09, 1960 ?Today's Date: 10/04/2021 ? ? ?History of present illness Pt is a 61 y/o male admitted 06/29/21 following C3-7 ACDF after progressive cervical myelopathy symptoms including ataxia, bil UE and LE shaking and weakness, and loss of function in his hands with subsequent fall. Pt initially had improvement in neurological symptoms, but postoperative course was complicated by dysphasia and ataxia secondary to and epidural hematoma with evacuation of hematoma on 2/7. Course complicated by delirium secondary to encephalopathy, sepsis secondary to aspiration PNA, reintubated 2/19-2/26 for airway protection, bilat segmental PEs 2/18. PEG placed 3/6. PMHx includes tobacco and EtOH use disorder. ?  ?OT comments ? Patient received in supine and eager to participate with OT. Patient was able to get to EOB with supervision. LB dressing performed seated on EOB without adaptive equipment and mod assist with patient standing to pull up. Patient ambulated to sink and performed grooming with assistance for balance and to apply toothpaste. UB dressing performed with min assist for pullover top. Transfers and mobility performed with RW to increase independence with toilet transfers. Patient continues to make good gains and is motivated towards therapy.   ? ?Recommendations for follow up therapy are one component of a multi-disciplinary discharge planning process, led by the attending physician.  Recommendations may be updated based on patient status, additional functional criteria and insurance authorization. ?   ?Follow Up Recommendations ? Skilled nursing-short term rehab (<3 hours/day)  ?  ?Assistance Recommended at Discharge Frequent or constant Supervision/Assistance  ?Patient can return home with the following ? Direct supervision/assist for medications management;Assist for transportation;Help with stairs or ramp for  entrance;Assistance with feeding;Assistance with cooking/housework;Direct supervision/assist for financial management;A little help with walking and/or transfers;A lot of help with bathing/dressing/bathroom ?  ?Equipment Recommendations ? Wheelchair (measurements OT);Wheelchair cushion (measurements OT);Hospital bed;BSC/3in1;Tub/shower seat  ?  ?Recommendations for Other Services   ? ?  ?Precautions / Restrictions Precautions ?Precautions: Fall ?Restrictions ?Weight Bearing Restrictions: No  ? ? ?  ? ?Mobility Bed Mobility ?Overal bed mobility: Needs Assistance ?Bed Mobility: Supine to Sit ?  ?  ?Supine to sit: Supervision, Modified independent (Device/Increase time) ?  ?  ?General bed mobility comments: able to use rails to assist ?  ? ?Transfers ?Overall transfer level: Needs assistance ?Equipment used: Rolling walker (2 wheels) ?Transfers: Sit to/from Stand ?Sit to Stand: Min guard ?  ?  ?  ?  ?  ?General transfer comment: stood from EOB and recliner with good hand placement ?  ?  ?Balance Overall balance assessment: Needs assistance ?Sitting-balance support: Feet supported ?Sitting balance-Leahy Scale: Fair ?Sitting balance - Comments: performed LB dressing seated on EOB with patient maintaining balance ?Postural control: Posterior lean ?Standing balance support: Single extremity supported, Bilateral upper extremity supported, During functional activity ?Standing balance-Leahy Scale: Poor ?Standing balance comment: pt able to stand at sink for grooming with assistance for balance ?  ?  ?  ?  ?  ?  ?  ?  ?  ?  ?  ?   ? ?ADL either performed or assessed with clinical judgement  ? ?ADL Overall ADL's : Needs assistance/impaired ?Eating/Feeding: Set up;Sitting ?Eating/Feeding Details (indicate cue type and reason): provided setup at end of session with patient using fork without built up foam ?Grooming: Wash/dry hands;Wash/dry face;Oral care;Minimal assistance;Standing ?Grooming Details (indicate cue type and  reason): assistance with toothpaste and balance standing at sink ?  ?  ?  ?  ?Upper Body  Dressing : Minimal assistance;Sitting ?Upper Body Dressing Details (indicate cue type and reason): donned pullover top ?Lower Body Dressing: Moderate assistance;Sit to/from stand ?Lower Body Dressing Details (indicate cue type and reason): donning scrub pants seating on EOB without AE ?Toilet Transfer: Min guard;Ambulation;Rolling walker (2 wheels) ?Toilet Transfer Details (indicate cue type and reason): simulated to recliner ?  ?  ?  ?  ?Functional mobility during ADLs: Min guard;Rolling walker (2 wheels) ?General ADL Comments: asked to attempt dressing and feeding self without adaptive equipment ?  ? ?Extremity/Trunk Assessment   ?  ?  ?  ?  ?  ? ?Vision   ?  ?  ?Perception   ?  ?Praxis   ?  ? ?Cognition Arousal/Alertness: Awake/alert ?Behavior During Therapy: [facility] for tasks assessed/performed ?  ?  ?  ?  ?  ?  ?  ?  ?  ?  ?  ?  ?  ?  ?  ?  ?  ?General Comments: improvement on hand placement for transfers ?  ?  ?   ?Exercises   ? ?  ?Shoulder Instructions   ? ? ?  ?General Comments    ? ? ?Pertinent Vitals/ Pain       Pain Assessment ?Pain Assessment: Faces ?Faces Pain Scale: No hurt ?Pain Intervention(s): Monitored during session ? ?Home Living   ?  ?  ?  ?  ?  ?  ?  ?  ?  ?  ?  ?  ?  ?  ?  ?  ?  ?  ? ?  ?Prior Functioning/Environment    ?  ?  ?  ?   ? ?Frequency ? Min 2X/week  ? ? ? ? ?  ?Progress Toward Goals ? ?OT Goals(current goals can now be found in the care plan section) ? Progress towards OT goals: Progressing toward goals ? ?Acute Rehab OT Goals ?Patient Stated Goal: go home ?OT Goal Formulation: With patient ?Time For Goal Achievement: 10/05/21 ?Potential to Achieve Goals: Good ?ADL Goals ?Pt Will Perform Eating: with min assist;with assist to don/doff brace/orthosis;with adaptive utensils;sitting ?Pt Will Perform Grooming: sitting;with min assist ?Pt Will Perform Upper Body Bathing: with mod assist;sitting ?Pt Will  Transfer to Toilet: with min assist;stand pivot transfer;bedside commode ?Pt/caregiver will Perform Home Exercise Program: Increased ROM;Increased strength;Both right and left upper extremity;With theraputty;With minimal assist;With written HEP provided ?Additional ADL Goal #1: Pt will grasp objects with R hand and bring to mouth 10/10 trials in prep for self feeding ?Additional ADL Goal #2: Pt will grasp/release 5/5 objects with L hand to increase indep with ADLs  ?Plan Discharge plan remains appropriate   ? ?Co-evaluation ? ? ?   ?  ?  ?  ?  ? ?  ?AM-PAC OT "6 Clicks" Daily Activity     ?Outcome Measure ? ? Help from another person eating meals?: A Little ?Help from another person taking care of personal grooming?: A Little ?Help from another person toileting, which includes using toliet, bedpan, or urinal?: A Lot ?Help from another person bathing (including washing, rinsing, drying)?: A Lot ?Help from another person to put on and taking off regular upper body clothing?: A Little ?Help from another person to put on and taking off regular lower body clothing?: A Lot ?6 Click Score: 15 ? ?  ?End of Session Equipment Utilized During Treatment: Gait belt;Rolling walker (2 wheels) ? ?OT Visit Diagnosis: Other abnormalities of gait and mobility (R26.89);Muscle weakness (generalized) (M62.81);Feeding difficulties (R63.3);Other  symptoms and signs involving the nervous system (R29.898);Other symptoms and signs involving cognitive function ?  ?Activity Tolerance Patient tolerated treatment well ?  ?Patient Left in chair;with call bell/phone within reach;with chair alarm set;with family/visitor present ?  ?Nurse Communication Mobility status ?  ? ?   ? ?Time: [id number] ?OT Time Calculation (min): 31 min ? ?Charges: OT General Charges ?$OT Visit: 1 Visit ?OT Treatments ?$Self Care/Home Management : 8-22 mins ?$Therapeutic Activity: 8-22 mins ? ?Lodema Hong, OTA ?Acute Rehabilitation Services  ?Pager [phone] ?Office  [phone] ? ? ?[place] ?10/04/2021, 9:50 AM ?

## 2021-10-05 NOTE — Progress Notes (Signed)
? ?PROGRESS NOTE ? ? ? ?Brian [name]  [id number] DOB: July 20, 1960 DOA: 06/29/2021 ?PCP: Pcp, No ? ?Primary service: Neurosurgery ? ?Brief Narrative: ?Brian [name] is a 61 year old male with past medical history significant for tobacco and EtOH use disorder who was admitted by neurosurgery for cervical myelopathy due to critical multilevel cervical spinal canal stenosis C3-6 with spinal cord compression and spinal cord signal change after recent fall with progressive upper and lower extremity weakness and tremors. ?Pt transferred to the floor on 3/2 and PCCM requested transition to [facility] for medical assistance while patient remains under the neurosurgery service. ? ?2/03 ACDF C3-4, C4-5, C5-5, and C6-7 w/ Dr. Annette Stable ?2/07 Code stroke overnight, neg for LVO, CT showing soft tissue swelling. PCCM consulted for concern of airway management and AMS. Went for evacuation of epidural hematoma  ?2/18 CT Abd/Pelvis: showing bilateral segmental Pes, started on heparin and showing worsening pneumonia, febrile to  103.  PCT rising, 34.6, restarted on abx ?2/19 possible aspiration w/ severe hypoxia; intubated; Switching from heparin to angiomax given subtherapeutic levels despite increase in rate. ?2/23 Ketamine infusion added ?2/26 extubated ?3/02 Modified barium swallow, continues n.p.o. with core track in place,  ?3/06 s/p IR G-tube placement ?3/24 E. coli UTI/bacteremia>> cefazolin x7 days per ID ?4/14 repeat urine culture + Citrobacter Freundii that is Sensitive to Bactrim,  ?4/16 reinserted foley due to recurrent urinary retention ?4/28 accidentally pulled out PEG tube the evening.   ?Tolerating oral diet with 3 times daily Premier protein shake ?Medically stable.  Awaiting placement/improvement with mobility to be discharged home. ? ? ?Assessment and Plan: ?Cervical myelopathy ([place]); severe cervical spinal stenosis with cord compression s/p ACDF [id number] on 2/3, complicated by epidural hematoma s/p evacuation  2/7 ?Awaiting SNF placement per TOC ? ?Acute respiratory failure with hypoxia ([place]) ?Likely Multifactorial with significant dysphagia and recurrent aspiration pneumonia events during initial hospitalization following ACDF surgery with postoperative complications of postoperative cervical hematoma. Patient did require ventilatory support in the intensive care unit and was successfully extubated on 07/22/2021.  Patient completed 5 days of Vancomycin/Cefepime > Vancomycin Zosyn for aspiration pneumonia. Now on room air.  ? ?Acute pulmonary embolism ([place]) ?CTA chest (2/7) showed bilateral segmental PE. LE [place] negative for DVT.  TTE with normal LV systolic function.  Supplemental oxygen weaned off. ?-Continue Eliquis 5 mg twice daily ? ?Acute metabolic encephalopathy ?CT head, MRI brain, EEG, TSH, B12, ammonia unrevealing.  Etiology likely multifactorial with acute respiratory failure secondary to aspiration pneumonia, bilateral pulmonary embolism.  Resolved. ? ?E. coli bacteremia ?Urine culture and blood cultures x2 08/17/2021 positive for E. coli. Completed 7-day course of Ceftriaxone > Cefazolin. ? ?Severe sepsis due to recurrent aspiration pneumonia ?Treated with Vancomycin/Cefepime and transitioned to Vancomycin/Zosyn. Completed a 5 day treatment course.  ?-Continue aspiration precautions ? ?Sinus tachycardia ?Multifactorial including sepsis, dehydration, PE, agitation. Patient started on metoprolol. Tachycardia now resolved. Metoprolol weaned down to 12.5 mg BID dosing. ?-Discontinue Metoprolol and watch heart rates ? ?Elevated liver enzymes ?Etiology likely secondary to sepsis from aspiration pneumonia as above.  Acute hepatitis panel negative.  RUQ ultrasound unremarkable. Resolved. ? ?Hyponatremia ?Etiology likely secondary to dehydration. Mild. Improved with good nutritional intake ? ?Tobacco use disorder ?Counseled on need for complete cessation. Managed on nicotine patch which is now discontinued. ? ?Physical  deconditioning ?Significant weakness in all extremities as a result of cervical myelopathy and acute illness.  Slowly improving while inpatient with physical therapy. Recommendation for SNF on discharge. ?-Continue PT/OT ? ?Urinary  retention ?Foley catheter discontinued on 08/09/2021; replaced on 09/09/2021 for recurrent urinary retention and removed again on 4/25. Resolved. ?-Continue tamsulosin and finasteride ? ?Protein-calorie malnutrition, severe ([place]) ?Dietitian recommendations (5/9): ?Continue current diet as ordered, encourage PO intake ?Boost Plus TID to provide 360kcal and 14g of protein per carton ?Continue vitamin regimen ? ?Vomiting ?In setting of tube feeds. Now resolved. ? ?Citrobacter infection ?Noted on urine culture (4/14). Patient treated empirically with Ceftriaxone IV. Urine culture showed Ceftriaxone resistant strain. Ceftriaxone transitioned to Bactrim to complete a 7 day course of antibiotic therapy. ? ?Thrombocytosis ?Likely Reactive. Now resolved. ? ?Normocytic anemia ?Chronic and stable. Patient did require 1 unit of pRBC for acute anemia while in ICU, but unsure of etiology. ? ?Fall during current hospitalization ?No injury. Patient with recurrent falls. ?-Continue fall precautions.   ? ?Lumbar back pain ?-Per primary ? ?Hyperglycemia-resolved as of 07/12/2021 ?Likely due to Steroids. Hemoglobin A1C of 5.2%. Resolved. ? ? ?DVT prophylaxis: Eliquis ?Code Status:   Code Status: Full Code ?Family Communication: None at bedside ?Disposition Plan: SNF when bed is available/per primary ? ? ?Consultants:  ?PCCM ?Infectious disease ?Neurology ?Palliative care ?General medicine ? ?Procedures:  ?ACDF C3-7, neurosurgery Dr. Trenton Gammon 2/3 ?Hematoma evacuation 2/7 ?Vascular duplex ultrasound bilateral lower extremities 2/19 ?TTE 2/20 ?TTE 3/1 ?IR G-tube placement 3/6, 3/24 ? ?Antimicrobials: ?Vancomycin 2/7 - 2/8; 2/18 - 2/21 ?Zosyn 2/21 -2/23 ?Ampicillin 2/7 -2/11 ?Metronidazole 2/7 - 2/8; 2/17  -2/18 ?Cefepime 2/18 - 2/21 ?Unasyn 3/24 - 3/24 ?Perioperative cefazolin ?Ceftriaxone 3/25 - 3/27 ?Cefazolin 3/27 - 4/1  ? ? ?Subjective: ?Continues to report no issues/concerns apart from looking forward to discharge when able. ? ?Objective: ?BP 93/69 (BP Location: Right Arm)   Pulse 85   Temp 97.8 ?F (36.6 ?C) (Oral)   Resp 16   Ht 5\' 10"  (1.778 m)   Wt 53.4 kg   SpO2 100%   BMI 16.89 kg/m?  ? ?Examination: ? ?General: Well appearing, no distress ? ? ?Data Reviewed: I have personally reviewed following labs and imaging studies ? ?CBC ?Lab Results  ?Component Value Date  ? WBC 6.5 09/24/2021  ? RBC 3.28 (L) 09/24/2021  ? HGB 10.1 (L) 09/24/2021  ? HCT 30.9 (L) 09/24/2021  ? MCV 94.2 09/24/2021  ? MCH 30.8 09/24/2021  ? PLT 373 09/24/2021  ? MCHC 32.7 09/24/2021  ? RDW 16.3 (H) 09/24/2021  ? LYMPHSABS 3.2 09/21/2021  ? MONOABS 0.6 09/21/2021  ? EOSABS 0.1 09/21/2021  ? BASOSABS 0.0 09/21/2021  ? ? ? ?Last metabolic panel ?Lab Results  ?Component Value Date  ? NA 135 09/24/2021  ? K 4.0 09/24/2021  ? CL 99 09/24/2021  ? CO2 25 09/24/2021  ? BUN 29 (H) 09/24/2021  ? CREATININE 0.76 09/24/2021  ? GLUCOSE 97 09/24/2021  ? GFRNONAA >60 09/24/2021  ? CALCIUM 10.4 (H) 09/24/2021  ? PHOS 4.5 09/24/2021  ? PROT 7.6 09/18/2021  ? ALBUMIN 3.2 (L) 09/20/2021  ? BILITOT 0.5 09/18/2021  ? ALKPHOS 110 09/18/2021  ? AST 28 09/18/2021  ? ALT 19 09/18/2021  ? ANIONGAP 11 09/24/2021  ? ? ?GFR: ?Estimated Creatinine Clearance: 74.2 mL/min (by C-G formula based on SCr of 0.76 mg/dL). ? ?No results found for this or any previous visit (from the past 240 hour(s)).  ? ? ?Radiology Studies: ?No results found. ? ? ? LOS: 98 days  ? ? ?Cordelia Poche, MD ?[facility] ?10/05/2021, 10:58 AM ? ? ?If 7PM-7AM, please contact night-coverage [email or web address] ? ?

## 2021-10-05 NOTE — Plan of Care (Signed)
  Problem: Education: Goal: Ability to verbalize activity precautions or restrictions will improve Outcome: Progressing   

## 2021-10-05 NOTE — Progress Notes (Signed)
Physical Therapy Treatment ?Patient Details ?Name: Brian [name] ?MRN: [id number] ?DOB: [birth year] ?Today's Date: 10/05/2021 ? ? ?History of Present Illness Pt is a 61 y/o male admitted 06/29/21 following C3-7 ACDF after progressive cervical myelopathy symptoms including ataxia, bil UE and LE shaking and weakness, and loss of function in his hands with subsequent fall. Pt initially had improvement in neurological symptoms, but postoperative course was complicated by dysphasia and ataxia secondary to and epidural hematoma with evacuation of hematoma on 2/7. Course complicated by delirium secondary to encephalopathy, sepsis secondary to aspiration PNA, reintubated 2/19-2/26 for airway protection, bilat segmental PEs 2/18. PEG placed 3/6. PMHx includes tobacco and EtOH use disorder. ? ?  ?PT Comments  ? ? Pt was seen for progression of mobility on RW, using close guard of wheelchair and encouraging limits during the walk.  Pt is not in pain and was positive at the end about his tolerance for the effort.  He demonstrates the same issues of catching the L foot but is stronger to clear the foot from the floor and maintain a better upright posture.  Follow acutely with observation of safety and safe limits of distances to avoid overdoing and a potential fall.  Pt is voicing understanding about these objectives.   ?Recommendations for follow up therapy are one component of a multi-disciplinary discharge planning process, led by the attending physician.  Recommendations may be updated based on patient status, additional functional criteria and insurance authorization. ? ?Follow Up Recommendations ? Acute inpatient rehab (3hours/day) ?  ?  ?Assistance Recommended at Discharge Frequent or constant Supervision/Assistance  ?Patient can return home with the following A little help with walking and/or transfers;A little help with bathing/dressing/bathroom;Assistance with cooking/housework;Assist for transportation;Help with stairs  or ramp for entrance ?  ?Equipment Recommendations ? Rolling walker (2 wheels);Wheelchair (measurements PT);Wheelchair cushion (measurements PT)  ?  ?Recommendations for Other Services Rehab consult ? ? ?  ?Precautions / Restrictions Precautions ?Precautions: Fall ?Precaution Booklet Issued: No ?Restrictions ?Weight Bearing Restrictions: No  ?  ? ?Mobility ? Bed Mobility ?  ?  ?  ?  ?  ?  ?  ?General bed mobility comments: up in chair ?  ? ?Transfers ?Overall transfer level: Needs assistance ?Equipment used: Rolling walker (2 wheels) ?Transfers: Sit to/from Stand ?Sit to Stand: Min guard ?  ?  ?  ?  ?  ?General transfer comment: reminders for hand placement ?  ? ?Ambulation/Gait ?Ambulation/Gait assistance: Min guard ?Gait Distance (Feet): 250 Feet ([id number]) ?Assistive device: Rolling walker (2 wheels) ?Gait Pattern/deviations: Step-through pattern, Trunk flexed, Wide base of support ?Gait velocity: controlled ?Gait velocity interpretation: <1.31 ft/sec, indicative of household ambulator ?  ?  ? ? ?Stairs ?  ?  ?  ?  ?  ? ? ?Wheelchair Mobility ?  ? ?Modified Rankin (Stroke Patients Only) ?  ? ? ?  ?Balance Overall balance assessment: Needs assistance ?Sitting-balance support: Feet supported ?Sitting balance-Leahy Scale: Fair ?  ?Postural control: Posterior lean ?Standing balance support: Bilateral upper extremity supported, During functional activity ?Standing balance-Leahy Scale: Poor ?  ?  ?  ?  ?  ?  ?  ?  ?  ?  ?  ?  ?  ? ?  ?Cognition Arousal/Alertness: Awake/alert ?Behavior During Therapy: [facility] for tasks assessed/performed ?Overall Cognitive Status: Within Functional Limits for tasks assessed ?  ?  ?  ?  ?  ?  ?  ?  ?  ?  ?  ?  ?  ?Safety/Judgement:  Decreased awareness of deficits ?  ?  ?General Comments: pt required PT to have him rest while walking as he is not willing always to acknowledge his need to take a break.  Has often a pre-set plan, but with a chair to rest can increase his distance ?  ?  ? ?   ?Exercises   ? ?  ?General Comments General comments (skin integrity, edema, etc.): pt was able to be assisted safely to increase walking distance, with cued rest time was safer and better able to control forward lean of walking ?  ?  ? ?Pertinent Vitals/Pain Pain Assessment ?Pain Assessment: No/denies pain  ? ? ?Home Living   ?  ?  ?  ?  ?  ?  ?  ?  ?  ?   ?  ?Prior Function    ?  ?  ?   ? ?PT Goals (current goals can now be found in the care plan section) Acute Rehab PT Goals ?Patient Stated Goal: to walk more.  To get home. ?Progress towards PT goals: Progressing toward goals ? ?  ?Frequency ? ? ? Min 5X/week ? ? ? ?  ?PT Plan Current plan remains appropriate  ? ? ?Co-evaluation   ?  ?  ?  ?  ? ?  ?AM-PAC PT "6 Clicks" Mobility   ?Outcome Measure ? Help needed turning from your back to your side while in a flat bed without using bedrails?: A Little ?Help needed moving from lying on your back to sitting on the side of a flat bed without using bedrails?: A Little ?Help needed moving to and from a bed to a chair (including a wheelchair)?: A Little ?Help needed standing up from a chair using your arms (e.g., wheelchair or bedside chair)?: A Little ?Help needed to walk in hospital room?: A Little ?Help needed climbing 3-5 steps with a railing? : A Little ?6 Click Score: 18 ? ?  ?End of Session Equipment Utilized During Treatment: Gait belt ?Activity Tolerance: Patient tolerated treatment well ?Patient left: in chair;with call bell/phone within reach;with chair alarm set ?Nurse Communication: Mobility status ?PT Visit Diagnosis: Other abnormalities of gait and mobility (R26.89);Other symptoms and signs involving the nervous system (R29.898) ?  ? ? ?Time: [id number] ?PT Time Calculation (min) (ACUTE ONLY): 20 min ? ?Charges:  $Gait Training: 8-22 mins  ?Ramond Dial ?10/05/2021, 5:11 PM ? ?Mee Hives, PT PhD ?Acute Rehab Dept. Number: [facility] [id number] and [place] [phone] ? ? ?

## 2021-10-05 NOTE — Progress Notes (Signed)
? ?  Providing Compassionate, Quality Care - Together ? ?Hospital course: ?Patient status post C3-4, C4-5, C5-6, C6-7 anterior cervical discectomy with interbody fusion by Dr. Annette Stable on 06/29/2021. Increased difficulty swallowing with lung atelectasis and elevated temperatures on 07/02/2021. Made NPO following MBS by SLP. 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 CTA head and neck negative for stroke. MRI revealed epidural hematoma. Patient underwent exploration of his cervical fusion with evacuation of the epidural hematoma on 07/03/2021. Cortrak was placed on 07/04/2021. Patient's strength much improved since epidural hematoma evacuation. Cortrak removed 07/06/2021 and dysphagia 2 diet with nectar thick liquids started. Patient with delirium vs ETOH withdrawal. Discontinued steroids 07/06/2021. CIWA protocol started and discontinued 07/07/2021. CT and MRI 07/07/2021 negative. Patient developed respiratory distress, requiring intubation on 07/15/2021. Work up revealed aspiration PNA and PE. Patient was extubated on 07/22/2021. Foley catheter removed 07/24/2021. Patient transferred to 3W on 07/26/2021. Foley replaced 07/28/2021. PEG placed 07/30/2021. Foley removed 08/09/2021. PEG pulled out by patient 08/16/2021. Delirium worsening 08/17/2021. Haldol administered. IR replaced PEG on 08/17/2021. Patient with urosepsis 08/18/2021. Culture grew E. Coli. C/o LBP 08/28/2021. Lumbar MRI demonstrated chronic degenerative changes with spinal stenosis at L3-4, L4-5, and L5-S1. Patient suffered a fall overnight 08/29/2021 without injury. Worsening muscle spasms 09/11/2021, baclofen started. Patient transitioned to regular diet 09/12/2021. Episode of emesis overnight 4/19-4/20. No further episodes of emesis since 4/20. KUB 4/20 just demonstrated a nonobstructive bowel gas pattern. Fall without injury reported 09/16/2021. Foley catheter removed again. Another fall 09/19/2021. No injury per report, but per day  nurse c/o left lower back pain. Episode of emesis in the morning on 09/20/2021. PEG pulled out 09/21/2021. PEG remains out. ? ?Subjective: ?Patient reports no issues overnight. ? ?Objective: ?Vital signs in last 24 hours: ?Temp:  [97.8 ?F (36.6 ?C)-98.5 ?F (36.9 ?C)] 97.8 ?F (36.6 ?C) (05/12 [id number]) ?Pulse Rate:  [85-107] 85 (05/12 0836) ?Resp:  [16] 16 (05/12 0836) ?BP: (93-108)/(68-79) 93/69 (05/12 [id number]) ?SpO2:  [100 %] 100 % (05/12 0836) ? ?Intake/Output from previous day: ?05/11 0701 - 05/12 0700 ?In: -  ?Out: 625 [Urine:625] ?Intake/Output this shift: ?No intake/output data recorded. ? ?Alert; oriented to person, place, and time ?MAE, Generalized weakness ?Decreased fine motor BUE ?Incision is well-healed ? ?Lab Results: ?No results for input(s): WBC, HGB, HCT, PLT in the last 72 hours. ?BMET ?No results for input(s): NA, K, CL, CO2, GLUCOSE, BUN, CREATININE, CALCIUM in the last 72 hours. ? ?Studies/Results: ?No results found. ? ?Assessment/Plan: ?Patient is doing well after a very complicated hospital course following four level ACDF. He would like to discharge home. Continue efforts at mobilization. ?  ? ? LOS: 98 days  ? ? ? ?Viona Gilmore, DNP, AGNP-C ?Nurse Practitioner ? ?[facility] ?[address], [place], [place] [address] ?[id number]    [id number] ? ?10/05/2021, 9:42 AM ? ? ? ? ?

## 2021-10-06 NOTE — Progress Notes (Signed)
NEUROSURGERY PROGRESS NOTE ? ?Doing well. States that hes having a good morning. No acute events over night  ? ?Temp:  [97.8 ?F (36.6 ?C)-98.5 ?F (36.9 ?C)] 98.4 ?F (36.9 ?C) (05/13 7342) ?Pulse Rate:  [51-103] 51 (05/13 0807) ?Resp:  [16-20] 18 (05/13 0807) ?BP: (90-111)/(69-86) 111/86 (05/13 8768) ?SpO2:  [98 %-100 %] 98 % (05/13 0807) ? ? ?Sherryl Manges, NP ?10/06/2021 ?8:24 AM ?  ?

## 2021-10-07 NOTE — Progress Notes (Signed)
Stable.  No change.  Continue current management ?

## 2021-10-08 NOTE — Progress Notes (Addendum)
Occupational Therapy Treatment ?Patient Details ?Name: Brian [name] ?MRN: [id number] ?DOB: 1960/07/13 ?Today's Date: 10/08/2021 ? ? ?History of present illness Pt is a 61 y/o male admitted 06/29/21 following C3-7 ACDF after progressive cervical myelopathy symptoms including ataxia, bil UE and LE shaking and weakness, and loss of function in his hands with subsequent fall. Pt initially had improvement in neurological symptoms, but postoperative course was complicated by dysphasia and ataxia secondary to and epidural hematoma with evacuation of hematoma on 2/7. Course complicated by delirium secondary to encephalopathy, sepsis secondary to aspiration PNA, reintubated 2/19-2/26 for airway protection, bilat segmental PEs 2/18. PEG placed 3/6. PMHx includes tobacco and EtOH use disorder. ?  ? Full note below, Pt progressing towards OT goals. Able to perform toilet transfer at min guard, peri care at mod A, manage clothing with min guard and extra time, demonstrating UB and LB dressing with min guard and multiple sit<>stands. ? ?[name] OTR/L ?Acute Rehabilitation Services ?Pager: [phone] ?Office: [phone] ? ? ? 10/08/21 1450  ?OT Visit Information  ?Last OT Received On 10/08/21  ?Assistance Needed +1  ?History of Present Illness Pt is a 61 y/o male admitted 06/29/21 following C3-7 ACDF after progressive cervical myelopathy symptoms including ataxia, bil UE and LE shaking and weakness, and loss of function in his hands with subsequent fall. Pt initially had improvement in neurological symptoms, but postoperative course was complicated by dysphasia and ataxia secondary to and epidural hematoma with evacuation of hematoma on 2/7. Course complicated by delirium secondary to encephalopathy, sepsis secondary to aspiration PNA, reintubated 2/19-2/26 for airway protection, bilat segmental PEs 2/18. PEG placed 3/6. PMHx includes tobacco and EtOH use disorder.  ?Precautions  ?Precautions Fall  ?Precaution Booklet Issued No   ?Restrictions  ?Weight Bearing Restrictions No  ?Pain Assessment  ?Pain Assessment Faces  ?Faces Pain Scale 0  ?Pain Intervention(s) Monitored during session  ?Cognition  ?Arousal/Alertness Awake/alert  ?Behavior During Therapy [facility] for tasks assessed/performed  ?Overall Cognitive Status Within Functional Limits for tasks assessed  ?Upper Extremity Assessment  ?Upper Extremity Assessment RUE deficits/detail  ?RUE Deficits / Details digits flexion 4/5, digit extension unable to achieve full ROM, mild discoordination  ?RUE Sensation decreased light touch;decreased proprioception  ?RUE Coordination decreased fine motor;decreased gross motor  ?LUE Deficits / Details digits flexion 4/5, digit extension unable to achieve full ROM, mild discoordination  ?LUE Sensation decreased light touch  ?LUE Coordination decreased fine motor;decreased gross motor  ?ADL  ?Overall ADL's  Needs assistance/impaired  ?Grooming Oral care;Set up;Sitting  ?Grooming Details (indicate cue type and reason) assist with toothpaste - squeezing out tube  ?Upper Body Bathing Moderate assistance  ?Upper Body Bathing Details (indicate cue type and reason) assist for back. Pt able to complete front  ?Surveyor, minerals guard;Ambulation;Rolling walker (2 wheels)  ?Toilet Transfer Details (indicate cue type and reason) into bathroom  ?Toileting- Clothing Manipulation and Hygiene Minimal assistance;Sit to/from stand  ?Toileting - Clothing Manipulation Details (indicate cue type and reason) assist for balance, and use of warm wash cloth for rear peri care  ?Functional mobility during ADLs Min guard;Rolling walker (2 wheels)  ?General ADL Comments Pt very motivated  ?Bed Mobility  ?General bed mobility comments OOB at beginning and end of session  ?Transfers  ?Overall transfer level Needs assistance  ?Equipment used Rolling walker (2 wheels)  ?Transfers Sit to/from Stand  ?Sit to Stand Min guard  ?General transfer comment good power up, good hand placement   ?Balance  ?Overall balance assessment Needs assistance  ?  Sitting-balance support Feet supported  ?Sitting balance-Leahy Scale Good  ?Standing balance support Single extremity supported;Bilateral upper extremity supported;During functional activity;Reliant on assistive device for balance  ?Standing balance-Leahy Scale Poor  ?OT - End of Session  ?Equipment Utilized During Treatment Gait belt;Rolling walker (2 wheels)  ?Activity Tolerance Patient tolerated treatment well  ?Patient left in chair;with call bell/phone within reach;with chair alarm set  ?Nurse Communication Mobility status  ?OT Assessment/Plan  ?OT Plan Discharge plan remains appropriate;Frequency remains appropriate  ?OT Visit Diagnosis Other abnormalities of gait and mobility (R26.89);Muscle weakness (generalized) (M62.81);Feeding difficulties (R63.3);Other symptoms and signs involving the nervous system (R29.898);Other symptoms and signs involving cognitive function  ?OT Frequency (ACUTE ONLY) Min 2X/week  ?Follow Up Recommendations Skilled nursing-short term rehab (<3 hours/day)  ?Assistance recommended at discharge Set up Supervision/Assistance  ?Patient can return home with the following A little help with walking and/or transfers;A little help with bathing/dressing/bathroom;Assistance with cooking/housework;Assistance with feeding;Assist for transportation;Help with stairs or ramp for entrance  ?OT Equipment Wheelchair (measurements OT);Wheelchair cushion (measurements OT);Hospital bed;BSC/3in1;Tub/shower seat  ?AM-PAC OT "6 Clicks" Daily Activity Outcome Measure (Version 2)  ?Help from another person eating meals? 3  ?Help from another person taking care of personal grooming? 3  ?Help from another person toileting, which includes using toliet, bedpan, or urinal? 2  ?Help from another person bathing (including washing, rinsing, drying)? 2  ?Help from another person to put on and taking off regular upper body clothing? 3  ?Help from another person  to put on and taking off regular lower body clothing? 2  ?6 Click Score 15  ?Progressive Mobility  ?What is the highest level of mobility based on the progressive mobility assessment? Level 5 (Walks with assist in room/hall) - Balance while stepping forward/back and can walk in room with assist - Complete  ?Activity Ambulated with assistance in room;Ambulated with assistance to bathroom  ?OT Goal Progression  ?Progress towards OT goals Progressing toward goals  ?Acute Rehab OT Goals  ?Patient Stated Goal go home  ?OT Goal Formulation With patient  ?Time For Goal Achievement 10/22/21  ?Potential to Achieve Goals Good  ?OT Time Calculation  ?OT Start Time (ACUTE ONLY) 1316  ?OT Stop Time (ACUTE ONLY) 1351  ?OT Time Calculation (min) 35 min  ?OT General Charges  ?$OT Visit 1 Visit  ?OT Treatments  ?$Self Care/Home Management  23-37 mins  ? ?[name] OTR/L ?Acute Rehabilitation Services ?Pager: [phone] ?Office: [phone] ? ?

## 2021-10-08 NOTE — Progress Notes (Signed)
Physical Therapy Treatment ?Patient Details ?Name: Brian [name] ?MRN: [id number] ?DOB: 21-Nov-1960 ?Today's Date: 10/08/2021 ? ? ?History of Present Illness Pt is a 61 y/o male admitted 06/29/21 following C3-7 ACDF after progressive cervical myelopathy symptoms including ataxia, bil UE and LE shaking and weakness, and loss of function in his hands with subsequent fall. Pt initially had improvement in neurological symptoms, but postoperative course was complicated by dysphasia and ataxia secondary to and epidural hematoma with evacuation of hematoma on 2/7. Course complicated by delirium secondary to encephalopathy, sepsis secondary to aspiration PNA, reintubated 2/19-2/26 for airway protection, bilat segmental PEs 2/18. PEG placed 3/6. PMHx includes tobacco and EtOH use disorder. ? ?  ?PT Comments  ? ? Pt progressing well.  Emphasis on sit to stands from lower surfaces, progression of gait, negotiation of stairs for strength, stamina and control of L>R LE. ?   ?Recommendations for follow up therapy are one component of a multi-disciplinary discharge planning process, led by the attending physician.  Recommendations may be updated based on patient status, additional functional criteria and insurance authorization. ? ?Follow Up Recommendations ? Acute inpatient rehab (3hours/day) ?  ?  ?Assistance Recommended at Discharge Frequent or constant Supervision/Assistance  ?Patient can return home with the following A little help with walking and/or transfers;A little help with bathing/dressing/bathroom;Assistance with cooking/housework;Assist for transportation;Help with stairs or ramp for entrance ?  ?Equipment Recommendations ? Rolling walker (2 wheels);Wheelchair (measurements PT);Wheelchair cushion (measurements PT)  ?  ?Recommendations for Other Services Rehab consult ? ? ?  ?Precautions / Restrictions Precautions ?Precautions: Fall ?Precaution Booklet Issued: No ?Restrictions ?Weight Bearing Restrictions: No  ?   ? ?Mobility ? Bed Mobility ?  ?  ?  ?  ?  ?  ?  ?General bed mobility comments: up in chair ?  ? ?Transfers ?Overall transfer level: Needs assistance ?Equipment used: Rolling walker (2 wheels) ?Transfers: Sit to/from Stand ?Sit to Stand: Min guard ?Stand pivot transfers:  (various height including lower surfaces.) ?  ?  ?  ?  ?  ?  ? ?Ambulation/Gait ?  ?Gait Distance (Feet): 140 Feet ?Assistive device: Rolling walker (2 wheels) ?Gait Pattern/deviations: Step-through pattern ?  ?Gait velocity interpretation: 1.31 - 2.62 ft/sec, indicative of limited community ambulator ?  ?General Gait Details: overall still mildly uncoordinated, wider BOS, but generally consistent in spacing. moderate speed held in control for the most part, but pt able to check speed if gets too fast. ? ? ?Stairs ?Stairs: Yes ?Stairs assistance: Min guard ?Stair Management: Two rails, Alternating pattern, Step to pattern, Forwards ?Number of Stairs: 5 ?General stair comments: uncoordinated step to pattern, less control with left LE stepping down., but safe with rails and assist ? ? ?Wheelchair Mobility ?  ? ?Modified Rankin (Stroke Patients Only) ?  ? ? ?  ?Balance   ?  ?Sitting balance-Leahy Scale: Fair ?  ?  ?  ?Standing balance-Leahy Scale: Poor ?Standing balance comment: reliant on UE's. ?  ?  ?  ?  ?  ?  ?  ?  ?  ?  ?  ?  ? ?  ?Cognition Arousal/Alertness: Awake/alert ?Behavior During Therapy: [facility] for tasks assessed/performed ?Overall Cognitive Status: Within Functional Limits for tasks assessed ?  ?  ?  ?  ?  ?  ?  ?  ?  ?  ?  ?  ?  ?  ?  ?  ?  ?  ?  ? ?  ?Exercises   ? ?  ?  General Comments   ?  ?  ? ?Pertinent Vitals/Pain Pain Assessment ?Pain Assessment: Faces ?Faces Pain Scale: Hurts a little bit ?Pain Location: hands ?Pain Descriptors / Indicators: Sore ?Pain Intervention(s): Monitored during session  ? ? ?Home Living   ?  ?  ?  ?  ?  ?  ?  ?  ?  ?   ?  ?Prior Function    ?  ?  ?   ? ?PT Goals (current goals can now be found in the care  plan section) Acute Rehab PT Goals ?PT Goal Formulation: With patient ?Time For Goal Achievement: 10/15/21 ?Potential to Achieve Goals: Good ?Progress towards PT goals: Progressing toward goals ? ?  ?Frequency ? ? ? Min 5X/week ? ? ? ?  ?PT Plan Current plan remains appropriate  ? ? ?Co-evaluation   ?  ?  ?  ?  ? ?  ?AM-PAC PT "6 Clicks" Mobility   ?Outcome Measure ? Help needed turning from your back to your side while in a flat bed without using bedrails?: A Little ?Help needed moving from lying on your back to sitting on the side of a flat bed without using bedrails?: A Little ?Help needed moving to and from a bed to a chair (including a wheelchair)?: A Little ?Help needed standing up from a chair using your arms (e.g., wheelchair or bedside chair)?: A Little ?Help needed to walk in hospital room?: A Little ?Help needed climbing 3-5 steps with a railing? : A Little ?6 Click Score: 18 ? ?  ?End of Session   ?Activity Tolerance: Patient tolerated treatment well ?Patient left: in chair;with call bell/phone within reach;with chair alarm set ?Nurse Communication: Mobility status ?PT Visit Diagnosis: Other abnormalities of gait and mobility (R26.89);Other symptoms and signs involving the nervous system (R29.898) ?  ? ? ?Time: [id number] ?PT Time Calculation (min) (ACUTE ONLY): 24 min ? ?Charges:  $Gait Training: 8-22 mins ?$Therapeutic Activity: 8-22 mins          ?          ? ?10/08/2021 ? ?Ginger Carne., PT ?Acute Rehabilitation Services ?[phone]  (pager) ?[phone]  (office) ? ? ?[name] Taijuan Serviss ?10/08/2021, 5:36 PM ? ?

## 2021-10-08 NOTE — Progress Notes (Addendum)
? ?PROGRESS NOTE ? ? ? ?Brian [name]  [id number] DOB: 12-14-1960 DOA: 06/29/2021 ?PCP: Pcp, No ? ?Primary service: Neurosurgery ? ?Brief Narrative: ?Brian [name] is a 61 year old male with past medical history significant for tobacco and EtOH use disorder who was admitted by neurosurgery for cervical myelopathy due to critical multilevel cervical spinal canal stenosis C3-6 with spinal cord compression and spinal cord signal change after recent fall with progressive upper and lower extremity weakness and tremors. ?Pt transferred to the floor on 3/2 and PCCM requested transition to [facility] for medical assistance while patient remains under the neurosurgery service. ? ?2/03 ACDF C3-4, C4-5, C5-5, and C6-7 w/ Dr. Annette Stable ?2/07 Code stroke overnight, neg for LVO, CT showing soft tissue swelling. PCCM consulted for concern of airway management and AMS. Went for evacuation of epidural hematoma  ?2/18 CT Abd/Pelvis: showing bilateral segmental Pes, started on heparin and showing worsening pneumonia, febrile to  103.  PCT rising, 34.6, restarted on abx ?2/19 possible aspiration w/ severe hypoxia; intubated; Switching from heparin to angiomax given subtherapeutic levels despite increase in rate. ?2/23 Ketamine infusion added ?2/26 extubated ?3/02 Modified barium swallow, continues n.p.o. with core track in place,  ?3/06 s/p IR G-tube placement ?3/24 E. coli UTI/bacteremia>> cefazolin x7 days per ID ?4/14 repeat urine culture + Citrobacter Freundii that is Sensitive to Bactrim,  ?4/16 reinserted foley due to recurrent urinary retention ?4/28 accidentally pulled out PEG tube the evening.   ?Tolerating oral diet with 3 times daily Premier protein shake ?Medically stable.  Awaiting placement/improvement with mobility to be discharged home. ? ? ?Assessment and Plan: ?Cervical myelopathy ([place]); severe cervical spinal stenosis with cord compression s/p ACDF [id number] on 2/3, complicated by epidural hematoma s/p evacuation  2/7 ?Awaiting SNF placement per TOC ? ?Acute respiratory failure with hypoxia ([place]) ?Likely Multifactorial with significant dysphagia and recurrent aspiration pneumonia events during initial hospitalization following ACDF surgery with postoperative complications of postoperative cervical hematoma. Patient did require ventilatory support in the intensive care unit and was successfully extubated on 07/22/2021.  Patient completed 5 days of Vancomycin/Cefepime > Vancomycin Zosyn for aspiration pneumonia. Now on room air.  ? ?Acute pulmonary embolism ([place]) ?CTA chest (2/7) showed bilateral segmental PE. LE [place] negative for DVT.  TTE with normal LV systolic function.  Supplemental oxygen weaned off. ?-Continue Eliquis 5 mg twice daily ? ?Acute metabolic encephalopathy ?CT head, MRI brain, EEG, TSH, B12, ammonia unrevealing.  Etiology likely multifactorial with acute respiratory failure secondary to aspiration pneumonia, bilateral pulmonary embolism.  Resolved. ? ?E. coli bacteremia ?Urine culture and blood cultures x2 08/17/2021 positive for E. coli. Completed 7-day course of Ceftriaxone > Cefazolin. ? ?Severe sepsis due to recurrent aspiration pneumonia ?Treated with Vancomycin/Cefepime and transitioned to Vancomycin/Zosyn. Completed a 5 day treatment course.  ?-Continue aspiration precautions ? ?Sinus tachycardia, resolved ?Multifactorial including sepsis, dehydration, PE, agitation. Patient started on metoprolol. Tachycardia now resolved. Metoprolol weaned down to 12.5 mg BID dosing. Metoprolol discontinued. ? ?Elevated liver enzymes ?Etiology likely secondary to sepsis from aspiration pneumonia as above.  Acute hepatitis panel negative.  RUQ ultrasound unremarkable. Resolved. ? ?Hyponatremia ?Etiology likely secondary to dehydration. Mild. Improved with good nutritional intake ? ?Tobacco use disorder ?Counseled on need for complete cessation. Managed on nicotine patch which is now discontinued. ? ?Physical  deconditioning ?Significant weakness in all extremities as a result of cervical myelopathy and acute illness.  Slowly improving while inpatient with physical therapy. Recommendation for SNF on discharge. ?-Continue PT/OT ? ?Urinary retention ?Foley catheter  discontinued on 08/09/2021; replaced on 09/09/2021 for recurrent urinary retention and removed again on 4/25. Resolved. ?-Continue tamsulosin and finasteride ? ?Protein-calorie malnutrition, severe ([place]) ?Dietitian recommendations (5/9): ?Continue current diet as ordered, encourage PO intake ?Boost Plus TID to provide 360kcal and 14g of protein per carton ?Continue vitamin regimen ? ?Vomiting ?In setting of tube feeds. Now resolved. ? ?Citrobacter infection ?Noted on urine culture (4/14). Patient treated empirically with Ceftriaxone IV. Urine culture showed Ceftriaxone resistant strain. Ceftriaxone transitioned to Bactrim to complete a 7 day course of antibiotic therapy. ? ?Thrombocytosis ?Likely Reactive. Now resolved. ? ?Normocytic anemia ?Chronic and stable. Patient did require 1 unit of pRBC for acute anemia while in ICU, but unsure of etiology. ? ?Fall during current hospitalization ?No injury. Patient with recurrent falls. ?-Continue fall precautions.   ? ?Lumbar back pain ?-Per primary ? ?Hyperglycemia-resolved as of 07/12/2021 ?Likely due to Steroids. Hemoglobin A1C of 5.2%. Resolved. ? ? ?DVT prophylaxis: Eliquis ?Code Status:   Code Status: Full Code ?Family Communication: None at bedside ?Disposition Plan: SNF when bed is available/per primary ? ? ?Consultants:  ?PCCM ?Infectious disease ?Neurology ?Palliative care ?General medicine ? ?Procedures:  ?ACDF C3-7, neurosurgery Dr. Trenton Gammon 2/3 ?Hematoma evacuation 2/7 ?Vascular duplex ultrasound bilateral lower extremities 2/19 ?TTE 2/20 ?TTE 3/1 ?IR G-tube placement 3/6, 3/24 ? ?Antimicrobials: ?Vancomycin 2/7 - 2/8; 2/18 - 2/21 ?Zosyn 2/21 -2/23 ?Ampicillin 2/7 -2/11 ?Metronidazole 2/7 - 2/8; 2/17  -2/18 ?Cefepime 2/18 - 2/21 ?Unasyn 3/24 - 3/24 ?Perioperative cefazolin ?Ceftriaxone 3/25 - 3/27 ?Cefazolin 3/27 - 4/1  ? ? ?Subjective: ?No issues noted overnight. ? ?Objective: ?BP 110/74 (BP Location: Left Arm)   Pulse 92   Temp 98.5 ?F (36.9 ?C) (Oral)   Resp 16   Ht 5\' 10"  (1.778 m)   Wt 56.2 kg   SpO2 100%   BMI 17.78 kg/m?  ? ?Examination: ? ?General: Well appearing, no distress ? ? ?Data Reviewed: I have personally reviewed following labs and imaging studies ? ?CBC ?Lab Results  ?Component Value Date  ? WBC 6.5 09/24/2021  ? RBC 3.28 (L) 09/24/2021  ? HGB 10.1 (L) 09/24/2021  ? HCT 30.9 (L) 09/24/2021  ? MCV 94.2 09/24/2021  ? MCH 30.8 09/24/2021  ? PLT 373 09/24/2021  ? MCHC 32.7 09/24/2021  ? RDW 16.3 (H) 09/24/2021  ? LYMPHSABS 3.2 09/21/2021  ? MONOABS 0.6 09/21/2021  ? EOSABS 0.1 09/21/2021  ? BASOSABS 0.0 09/21/2021  ? ? ? ?Last metabolic panel ?Lab Results  ?Component Value Date  ? NA 135 09/24/2021  ? K 4.0 09/24/2021  ? CL 99 09/24/2021  ? CO2 25 09/24/2021  ? BUN 29 (H) 09/24/2021  ? CREATININE 0.76 09/24/2021  ? GLUCOSE 97 09/24/2021  ? GFRNONAA >60 09/24/2021  ? CALCIUM 10.4 (H) 09/24/2021  ? PHOS 4.5 09/24/2021  ? PROT 7.6 09/18/2021  ? ALBUMIN 3.2 (L) 09/20/2021  ? BILITOT 0.5 09/18/2021  ? ALKPHOS 110 09/18/2021  ? AST 28 09/18/2021  ? ALT 19 09/18/2021  ? ANIONGAP 11 09/24/2021  ? ? ?GFR: ?Estimated Creatinine Clearance: 78.1 mL/min (by C-G formula based on SCr of 0.76 mg/dL). ? ?No results found for this or any previous visit (from the past 240 hour(s)).  ? ? ?Radiology Studies: ?No results found. ? ? ? LOS: 101 days  ? ? ?Cordelia Poche, MD ?[facility] ?10/08/2021, 9:10 AM ? ? ?If 7PM-7AM, please contact night-coverage ?[email or web address] ? ?

## 2021-10-09 NOTE — Progress Notes (Signed)
? ?PROGRESS NOTE ? ? ? ?Brian [name]  [id number] DOB: Oct 07, 1960 DOA: 06/29/2021 ?Brian [name]: Brian [name], No ? ?Primary service: Neurosurgery ? ?Brief Narrative: ?Brian [name] is a 61 year old male with past medical history significant for tobacco and EtOH use disorder who was admitted by neurosurgery for cervical myelopathy due to critical multilevel cervical spinal canal stenosis C3-6 with spinal cord compression and spinal cord signal change after recent fall with progressive upper and lower extremity weakness and tremors. ?Pt transferred to the floor on 3/2 and PCCM requested transition to [facility] for medical assistance while patient remains under the neurosurgery service. ? ?2/03 ACDF C3-4, C4-5, C5-5, and C6-7 w/ Dr. Annette [name] ?2/07 Code stroke overnight, neg for LVO, CT showing soft tissue swelling. PCCM consulted for concern of airway management and AMS. Went for evacuation of epidural hematoma  ?2/18 CT Abd/Pelvis: showing bilateral segmental Pes, started on heparin and showing worsening pneumonia, febrile to  103.  PCT rising, 34.6, restarted on abx ?2/19 possible aspiration w/ severe hypoxia; intubated; Switching from heparin to angiomax given subtherapeutic levels despite increase in rate. ?2/23 Ketamine infusion added ?2/26 extubated ?3/02 Modified barium swallow, continues n.p.o. with core track in place,  ?3/06 s/p IR G-tube placement ?3/24 Brian [name] UTI/bacteremia>> cefazolin x7 days per ID ?4/14 repeat urine culture + Brian [name] that is Sensitive to Brian [name],  ?4/16 reinserted foley due to recurrent urinary retention ?4/28 accidentally pulled out PEG tube the evening.   ?Tolerating oral diet with 3 times daily Premier protein shake ?Medically [name].  Awaiting placement/improvement with mobility to be discharged home. ? ? ?Assessment and Plan: ?Cervical myelopathy (Brian [name]); severe cervical spinal stenosis with cord compression s/p ACDF [id number] on 2/3, complicated by epidural hematoma s/p evacuation  2/7 ?Awaiting SNF placement per TOC ? ?Acute respiratory failure with hypoxia (Brian [name]) ?Likely Multifactorial with significant dysphagia and recurrent aspiration pneumonia events during initial hospitalization following ACDF surgery with postoperative complications of postoperative cervical hematoma. Patient did require ventilatory support in the intensive care unit and was successfully extubated on 07/22/2021.  Patient completed 5 days of Vancomycin/Cefepime > Vancomycin Zosyn for aspiration pneumonia. Now on room air.  ? ?Acute pulmonary embolism (Brian [name]) ?CTA chest (2/7) showed bilateral segmental PE. LE [place] negative for DVT.  TTE with normal LV systolic function.  Supplemental oxygen weaned off. ?-Continue Eliquis 5 mg twice daily ? ?Acute metabolic encephalopathy ?CT head, MRI brain, EEG, TSH, B12, ammonia unrevealing.  Etiology likely multifactorial with acute respiratory failure secondary to aspiration pneumonia, bilateral pulmonary embolism.  Resolved. ? ?Brian [name] bacteremia ?Urine culture and blood cultures x2 08/17/2021 positive for Brian [name]. Completed 7-day course of Brian [name] > Cefazolin. ? ?Severe sepsis due to recurrent aspiration pneumonia ?Treated with Vancomycin/Cefepime and transitioned to Vancomycin/Zosyn. Completed a 5 day treatment course.  ?-Continue aspiration precautions ? ?Sinus tachycardia, resolved ?Multifactorial including sepsis, dehydration, PE, agitation. Patient started on metoprolol. Tachycardia now resolved. Metoprolol weaned down to 12.5 mg BID dosing. Metoprolol discontinued. ? ?Elevated liver enzymes ?Etiology likely secondary to sepsis from aspiration pneumonia as above.  Acute hepatitis panel negative.  RUQ ultrasound unremarkable. Resolved. ? ?Hyponatremia ?Etiology likely secondary to dehydration. Mild. Improved with good nutritional intake ? ?Tobacco use disorder ?Counseled on need for complete cessation. Managed on nicotine patch which is now discontinued. ? ?Physical  deconditioning ?Significant weakness in all extremities as a result of cervical myelopathy and acute illness.  Slowly improving while inpatient with physical therapy. Recommendation for inpatient rehabilitation on discharge. ?-Continue PT/OT ? ?Urinary retention ?Foley  catheter discontinued on 08/09/2021; replaced on 09/09/2021 for recurrent urinary retention and removed again on 4/25. Resolved. ?-Continue tamsulosin and finasteride ? ?Protein-calorie malnutrition, severe (Brian [name]) ?Dietitian recommendations (5/9): ?Continue current diet as ordered, encourage PO intake ?Boost Plus TID to provide 360kcal and 14g of protein per carton ?Continue vitamin regimen ? ?Vomiting ?In setting of tube feeds. Now resolved. ? ?Brian infection ?Noted on urine culture (4/14). Patient treated empirically with Brian [name] IV. Urine culture showed Brian [name] resistant strain. Brian [name] transitioned to Brian [name] to complete a 7 day course of antibiotic therapy. ? ?Thrombocytosis ?Likely Reactive. Now resolved. ? ?Normocytic anemia ?Chronic and [name]. Patient did require 1 unit of pRBC for acute anemia while in ICU, but unsure of etiology. ? ?Fall during current hospitalization ?No injury. Patient with recurrent falls. ?-Continue fall precautions.   ? ?Lumbar back pain ?-Per primary ? ?Hyperglycemia-resolved as of 07/12/2021 ?Likely due to Steroids. Hemoglobin A1C of 5.2%. Resolved. ? ? ?DVT prophylaxis: Eliquis ?Code Status:   Code Status: Full Code ?Family Communication: None at bedside ?Disposition Plan: CIR/SNF when bed is available vs home. Medically [name] for discharge. Per primary ? ? ?Consultants:  ?PCCM ?Infectious disease ?Neurology ?Palliative care ?General medicine ? ?Procedures:  ?ACDF C3-7, neurosurgery Dr. Trenton Gammon 2/3 ?Hematoma evacuation 2/7 ?Vascular duplex ultrasound bilateral lower extremities 2/19 ?TTE 2/20 ?TTE 3/1 ?IR G-tube placement 3/6, 3/24 ? ?Antimicrobials: ?Vancomycin 2/7 - 2/8; 2/18 - 2/21 ?Zosyn 2/21  -2/23 ?Ampicillin 2/7 -2/11 ?Metronidazole 2/7 - 2/8; 2/17 -2/18 ?Cefepime 2/18 - 2/21 ?Unasyn 3/24 - 3/24 ?Perioperative cefazolin ?Brian [name] 3/25 - 3/27 ?Cefazolin 3/27 - 4/1  ? ? ?Subjective: ?No issues noted overnight. ? ?Objective: ?BP 100/79 (BP Location: Right Arm)   Pulse (!) 104   Temp 97.9 ?F (36.6 ?C) (Oral)   Resp 17   Ht 5\' 10"  (1.778 m)   Wt 56.2 kg   SpO2 100%   BMI 17.78 kg/m?  ? ?Examination: ? ?General exam: Appears calm and comfortable ? ? ?Data Reviewed: I have personally reviewed following labs and imaging studies ? ?CBC ?Lab Results  ?Component Value Date  ? WBC 6.5 09/24/2021  ? RBC 3.28 (L) 09/24/2021  ? HGB 10.1 (L) 09/24/2021  ? HCT 30.9 (L) 09/24/2021  ? MCV 94.2 09/24/2021  ? MCH 30.8 09/24/2021  ? PLT 373 09/24/2021  ? MCHC 32.7 09/24/2021  ? RDW 16.3 (H) 09/24/2021  ? LYMPHSABS 3.2 09/21/2021  ? MONOABS 0.6 09/21/2021  ? EOSABS 0.1 09/21/2021  ? BASOSABS 0.0 09/21/2021  ? ? ? ?Last metabolic panel ?Lab Results  ?Component Value Date  ? NA 135 09/24/2021  ? K 4.0 09/24/2021  ? CL 99 09/24/2021  ? CO2 25 09/24/2021  ? BUN 29 (H) 09/24/2021  ? CREATININE 0.76 09/24/2021  ? GLUCOSE 97 09/24/2021  ? GFRNONAA >60 09/24/2021  ? CALCIUM 10.4 (H) 09/24/2021  ? PHOS 4.5 09/24/2021  ? PROT 7.6 09/18/2021  ? ALBUMIN 3.2 (L) 09/20/2021  ? BILITOT 0.5 09/18/2021  ? ALKPHOS 110 09/18/2021  ? AST 28 09/18/2021  ? ALT 19 09/18/2021  ? ANIONGAP 11 09/24/2021  ? ? ?GFR: ?Estimated Creatinine Clearance: 78.1 mL/min (by C-G formula based on SCr of 0.76 mg/dL). ? ?No results found for this or any previous visit (from the past 240 hour(s)).  ? ? ?Radiology Studies: ?No results found. ? ? ? LOS: 102 days  ? ? ?Cordelia Poche, MD ?[facility] ?10/09/2021, 12:17 PM ? ? ?If 7PM-7AM, please contact night-coverage ?[email or web address] ? ?

## 2021-10-09 NOTE — Progress Notes (Signed)
?   10/09/21 0900  ?Assess: MEWS Score  ?Temp 97.7 ?F (36.5 ?C)  ?BP 96/73 ?(RN Notified)  ?Pulse Rate (!) 109 ?(RN Notified)  ?Resp 17  ?SpO2 100 %  ?Assess: MEWS Score  ?MEWS Temp 0  ?MEWS Systolic 1  ?MEWS Pulse 1  ?MEWS RR 0  ?MEWS LOC 0  ?MEWS Score 2  ?MEWS Score Color Yellow  ?Assess: if the MEWS score is Yellow or Red  ?Were vital signs taken at a resting state? Yes  ?Focused Assessment Change from prior assessment (see assessment flowsheet)  ?Early Detection of Sepsis Score *See Row Information* Low  ?MEWS guidelines implemented *See Row Information* Yes  ?Treat  ?MEWS Interventions Administered scheduled meds/treatments  ?Pain Scale 0-10  ?Pain Score 0  ?Faces Pain Scale 0  ?Pain Type Acute pain  ?Pain Location Back  ?Pain Orientation Mid;Lower  ?Take Vital Signs  ?Increase Vital Sign Frequency  Yellow: Q 2hr X 2 then Q 4hr X 2, if remains yellow, continue Q 4hrs  ?Escalate  ?MEWS: Escalate Yellow: discuss with charge nurse/RN and consider discussing with provider and RRT  ?Notify: Charge Nurse/RN  ?Name of Charge Nurse/RN Notified [facility]  ?Date Charge Nurse/RN Notified 10/09/21  ?Time Charge Nurse/RN Notified 0900  ?Notify: Provider  ?Provider Name/Title [facility]  ?Date Provider Notified 10/09/21  ?Time Provider Notified 0900  ?Method of Notification Page  ?Notification Reason Change in status  ?Provider response Evaluate remotely  ?Date of Provider Response 10/09/21  ?Time of Provider Response 0900  ?Document  ?Patient Outcome Stabilized after interventions  ?Progress note created (see row info) Yes  ? ?Will continue to monitor ?

## 2021-10-09 NOTE — Progress Notes (Signed)
Physical Therapy Treatment ?Patient Details ?Name: Brian [name] ?MRN: [id number] ?DOB: [birth year] ?Today's Date: 10/09/2021 ? ? ?History of Present Illness Pt is a 61 y/o male admitted 06/29/21 following C3-7 ACDF after progressive cervical myelopathy symptoms including ataxia, bil UE and LE shaking and weakness, and loss of function in his hands with subsequent fall. Pt initially had improvement in neurological symptoms, but postoperative course was complicated by dysphasia and ataxia secondary to and epidural hematoma with evacuation of hematoma on 2/7. Course complicated by delirium secondary to encephalopathy, sepsis secondary to aspiration PNA, reintubated 2/19-2/26 for airway protection, bilat segmental PEs 2/18. PEG placed 3/6. PMHx includes tobacco and EtOH use disorder. ? ?  ?PT Comments  ? ? Pt is steadily though slowly improving toward goals.  Today added need to get off the floor safely if/when he falls.  This turned out to be unattainable to pt without significant assist.  Pt stating in frustration, my goal will be to not fall under any circumstance.   Pt ended session with gait training in the hall with the RW , working on stability, maintaining safe gait pattern and stamina. ?  ?Recommendations for follow up therapy are one component of a multi-disciplinary discharge planning process, led by the attending physician.  Recommendations may be updated based on patient status, additional functional criteria and insurance authorization. ? ?Follow Up Recommendations ? Acute inpatient rehab (3hours/day) ?  ?  ?Assistance Recommended at Discharge Frequent or constant Supervision/Assistance  ?Patient can return home with the following A little help with walking and/or transfers;A little help with bathing/dressing/bathroom;Assistance with cooking/housework;Assist for transportation;Help with stairs or ramp for entrance ?  ?Equipment Recommendations ? Rolling walker (2 wheels);Wheelchair (measurements  PT);Wheelchair cushion (measurements PT)  ?  ?Recommendations for Other Services Rehab consult ? ? ?  ?Precautions / Restrictions Precautions ?Precautions: Fall  ?  ? ?Mobility ? Bed Mobility ?Overal bed mobility: Needs Assistance ?Bed Mobility: Supine to Sit ?  ?  ?Supine to sit: Supervision, Modified independent (Device/Increase time) ?  ?  ?General bed mobility comments: slow, but able to roll and come straight up from supine until can assist with UE's.  No assist needed. ?  ? ?Transfers ?Overall transfer level: Needs assistance ?Equipment used: Rolling walker (2 wheels) ?Transfers: Sit to/from Stand ?Sit to Stand: Min guard ?  ?  ?  ?  ?  ?General transfer comment: good power up, good hand placement ?  ? ?Ambulation/Gait ?Ambulation/Gait assistance: Min guard ?Gait Distance (Feet): 400 Feet ?Assistive device: Rolling walker (2 wheels) ?Gait Pattern/deviations: Step-through pattern ?  ?Gait velocity interpretation: 1.31 - 2.62 ft/sec, indicative of limited community ambulator ?  ?General Gait Details: Total distance without sitting and without chair to bailout (per pt request).  Gait degraded with fatigue, more low amplitude, incoordination with the start of less control of speed with fatigue, but cuing brought pt under control without assist. ? ? ?Stairs ?  ?  ?  ?  ?  ? ? ?Wheelchair Mobility ?  ? ?Modified Rankin (Stroke Patients Only) ?  ? ? ?  ?Balance   ?  ?Sitting balance-Leahy Scale: Fair ?  ?  ?  ?Standing balance-Leahy Scale: Poor ?Standing balance comment: reliant on UE's. ?  ?  ?  ?  ?  ?  ?  ?  ?  ?  ?  ?  ? ?  ?Cognition Arousal/Alertness: Awake/alert ?Behavior During Therapy: [facility] for tasks assessed/performed ?Overall Cognitive Status: Within Functional Limits for tasks assessed ?  ?  ?  ?  ?  ?  ?  ?  ?  ?  ?  ?  ?  ?  ?  ?  ?  ?  ?  ? ?  ?  Exercises   ? ?  ?General Comments General comments (skin integrity, edema, etc.): Pt was assisted from chair to floor using his UE's to sidelying and worked on  techniques to come to all 4's, tall kneeling, boost onto the bed and up, all with significant assist. pt unable to begin any of the transitions functionally without significant assist. ?  ?  ? ?Pertinent Vitals/Pain Pain Assessment ?Pain Assessment: Faces ?Faces Pain Scale: Hurts even more ?Pain Location: all over with assistance to get off the matted floor as an activity. ?Pain Descriptors / Indicators: Sore ?Pain Intervention(s): Monitored during session  ? ? ?Home Living   ?  ?  ?  ?  ?  ?  ?  ?  ?  ?   ?  ?Prior Function    ?  ?  ?   ? ?PT Goals (current goals can now be found in the care plan section) Acute Rehab PT Goals ?PT Goal Formulation: With patient ?Time For Goal Achievement: 10/15/21 ?Potential to Achieve Goals: Good ?Progress towards PT goals: Progressing toward goals ? ?  ?Frequency ? ? ? Min 5X/week ? ? ? ?  ?PT Plan Current plan remains appropriate  ? ? ?Co-evaluation   ?  ?  ?  ?  ? ?  ?AM-PAC PT "6 Clicks" Mobility   ?Outcome Measure ? Help needed turning from your back to your side while in a flat bed without using bedrails?: A Little ?Help needed moving from lying on your back to sitting on the side of a flat bed without using bedrails?: A Little ?Help needed moving to and from a bed to a chair (including a wheelchair)?: A Little ?Help needed standing up from a chair using your arms (e.g., wheelchair or bedside chair)?: A Little ?Help needed to walk in hospital room?: A Little ?Help needed climbing 3-5 steps with a railing? : A Little ?6 Click Score: 18 ? ?  ?End of Session   ?Activity Tolerance: Patient tolerated treatment well ?Patient left: in chair;with call bell/phone within reach;with chair alarm set ?Nurse Communication: Mobility status ?PT Visit Diagnosis: Other abnormalities of gait and mobility (R26.89);Other symptoms and signs involving the nervous system (R29.898) ?  ? ? ?Time: 7616-0737 ?PT Time Calculation (min) (ACUTE ONLY): 32 min ? ?Charges:  $Gait Training: 8-22  mins ?$Therapeutic Activity: 8-22 mins          ?          ? ?10/09/2021 ? ?Jacinto Halim., PT ?Acute Rehabilitation Services ?[phone]  (pager) ?[phone]  (office) ? ? ?Eliseo Gum Carrington Mullenax ?10/09/2021, 6:34 PM ? ?

## 2021-10-09 NOTE — Progress Notes (Signed)
Nutrition Follow-up ? ?DOCUMENTATION CODES:  ?Severe malnutrition in context of social or environmental circumstances, Underweight ? ?INTERVENTION:  ?Continue current diet as ordered, encourage PO intake ?Boost Plus TID to provide 360kcal and 14g of protein per carton ?Continue vitamin regimen ? ?NUTRITION DIAGNOSIS:  ?Severe Malnutrition related to social / environmental circumstances as evidenced by severe muscle depletion, severe fat depletion.  ?- ongoing  ? ?GOAL:  ?Patient will meet greater than or equal to 90% of their needs  ?- progressing, increasing oral intake ? ?MONITOR:  ?PO intake, Supplement acceptance, Skin, Weight trends, I & O's ? ?REASON FOR ASSESSMENT:  ?Consult ?Assessment of nutrition requirement/status, Enteral/tube feeding initiation and management ? ?ASSESSMENT:  ?Pt with hx EtOH abuse, tobacco use, and spinal stenosis initially presented 2/3 for planned multilevel anterior cervical decompression and fusion surgery after experiencing progressive bilateral upper and lower extremity weakness and spasticity due to critical multilevel cervical spinal stenosis with spinal cord compression and signal change. ? ?2/3 - s/p anterior cervical discectomy with interbody fusion  ?2/5 - pt initially complained of worsening swallowing function ?2/6 - MBS, NPO per SLP ?2/7 - transferred to ICU, s/p re-exploration anterior cervical fusion with evacuation of epidural hematoma ?2/8 - Cortrak tube placed (tip gastric), tube feeds initiated ?2/10 - MBS, diet advanced to dysphagia 2 with nectar-thick liquids, Cortrak removed ?2/19- Intubated after desaturation, possibly from PO intake ?2/20 - s/p cortrak tube; post pyloric  ?2/26 - Extubated ?3/02 - failed MBS ?3/6 - PEG placed ?3/23 - diet advanced to dysphagia 2 with nectar thick liquids s/p MBSS ?3/24 - PEG replaced ?4/28 - Pt removed PEG ? ?Pt continues to have good intake of meals and of his boost plus supplements. Medically cleared for discharge once pt has  worked with therapy to regain strength prior to returning home. Weight appears to be trending up despite some fluctuations from day to day in scales.  ? ?Nutritionally Relevant Medications: ?Scheduled Meds: ? baclofen  20 mg Oral TID  ? folic acid  1 mg Oral Daily  ? Boost Plus  237 mL Oral TID WC  ? multivitamin with minerals  1 tablet Oral Daily  ? senna-docusate  1 tablet Oral BID  ? thiamine  100 mg Oral Daily  ? ?PRN Meds: bisacodyl, diphenhydrAMINE, ondansetron, polyethylene glycol, sodium phosphate ? ?Labs Reviewed ? ?Diet Order:   ?Diet Order   ? ?       ?  Diet regular Room service appropriate? Yes with Assist; Fluid consistency: Thin  Diet effective now       ?  ? ?  ?  ? ?  ? ?EDUCATION NEEDS:  ?Education needs have been addressed ? ?Skin:  Skin Assessment: Reviewed RN Assessment ? ?Last BM:  5/15 ? ?Height:  ?Ht Readings from Last 1 Encounters:  ?07/15/21 5\' 10"  (1.778 m)  ? ?Weight:  ?Wt Readings from Last 1 Encounters:  ?10/08/21 56.2 kg  ? ?Ideal Body Weight:  78.2 kg ? ?BMI:  Body mass index is 17.78 kg/m?. ? ?Estimated Nutritional Needs:  ?Kcal:  2100-2300 ?Protein:  105-125 grams ?Fluid:  >2L/day ? ? ?Ranell Patrick, RD, LDN ?Clinical Dietitian ?RD pager # available in [place]  ?After hours/weekend pager # available in [place] ?

## 2021-10-09 NOTE — Progress Notes (Signed)
? ?  Providing Compassionate, Quality Care - Together ?Hospital course: ?Patient status post C3-4, C4-5, C5-6, C6-7 anterior cervical discectomy with interbody fusion by Dr. Annette Stable on 06/29/2021. Increased difficulty swallowing with lung atelectasis and elevated temperatures on 07/02/2021. Made NPO following MBS by SLP. 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 CTA head and neck negative for stroke. MRI revealed epidural hematoma. Patient underwent exploration of his cervical fusion with evacuation of the epidural hematoma on 07/03/2021. Cortrak was placed on 07/04/2021. Patient's strength much improved since epidural hematoma evacuation. Cortrak removed 07/06/2021 and dysphagia 2 diet with nectar thick liquids started. Patient with delirium vs ETOH withdrawal. Discontinued steroids 07/06/2021. CIWA protocol started and discontinued 07/07/2021. CT and MRI 07/07/2021 negative. Patient developed respiratory distress, requiring intubation on 07/15/2021. Work up revealed aspiration PNA and PE. Patient was extubated on 07/22/2021. Foley catheter removed 07/24/2021. Patient transferred to 3W on 07/26/2021. Foley replaced 07/28/2021. PEG placed 07/30/2021. Foley removed 08/09/2021. PEG pulled out by patient 08/16/2021. Delirium worsening 08/17/2021. Haldol administered. IR replaced PEG on 08/17/2021. Patient with urosepsis 08/18/2021. Culture grew E. Coli. C/o LBP 08/28/2021. Lumbar MRI demonstrated chronic degenerative changes with spinal stenosis at L3-4, L4-5, and L5-S1. Patient suffered a fall overnight 08/29/2021 without injury. Worsening muscle spasms 09/11/2021, baclofen started. Patient transitioned to regular diet 09/12/2021. Episode of emesis overnight 4/19-4/20. No further episodes of emesis since 4/20. KUB 4/20 just demonstrated a nonobstructive bowel gas pattern. Fall without injury reported 09/16/2021. Foley catheter removed again. Another fall 09/19/2021. No injury per report, but per day nurse  c/o left lower back pain. Episode of emesis in the morning on 09/20/2021. PEG pulled out 09/21/2021. PEG remains out. ? ?Subjective: ?Patient reports no issues overnight. ? ?Objective: ?Vital signs in last 24 hours: ?Temp:  [97.1 ?F (36.2 ?C)-98.4 ?F (36.9 ?C)] 97.7 ?F (36.5 ?C) (05/16 0900) ?Pulse Rate:  [83-109] 109 (05/16 0900) ?Resp:  [16-17] 17 (05/16 0900) ?BP: (95-119)/(73-90) 96/73 (05/16 0900) ?SpO2:  [100 %] 100 % (05/16 0900) ? ?Intake/Output from previous day: ?05/15 0701 - 05/16 0700 ?In: 120 [P.O.:120] ?Out: 600 [Urine:600] ?Intake/Output this shift: ?No intake/output data recorded. ? ? ?Alert; oriented to person, place, and time ?MAE, Generalized weakness ?Decreased fine motor BUE ?Incision is well-healed ?  ? ? ?Assessment/Plan: ?Patient is doing well after a very complicated hospital course following four level ACDF. He would like to discharge home. Continue efforts at mobilization. ?  ? LOS: 102 days  ? ?-See if we can establish timeline for patient to discharge home. ? ? ?Viona Gilmore, DNP, AGNP-C ?Nurse Practitioner ? ?[facility] ?[address], [address], [place], [place] [address] ?[id number]    [id number] ? ?10/09/2021, 9:39 AM ? ? ? ? ?

## 2021-10-10 NOTE — Progress Notes (Signed)
? ?  Providing Compassionate, Quality Care - Together ? ? ?Subjective: ?Patient reports he is concerned about keeping his apartment after this month as he has exhausted his savings since he was hospitalized 103 days ago. ? ?Objective: ?Vital signs in last 24 hours: ?Temp:  [97.9 ?F (36.6 ?C)-98.9 ?F (37.2 ?C)] 98 ?F (36.7 ?C) (05/17 3086) ?Pulse Rate:  [83-104] 93 (05/17 0814) ?Resp:  [16-20] 16 (05/17 0814) ?BP: (100-120)/(73-88) 104/77 (05/17 5784) ?SpO2:  [100 %] 100 % (05/17 0814) ? ?Intake/Output from previous day: ?05/16 0701 - 05/17 0700 ?In: 200 [P.O.:200] ?Out: 200 [Urine:200] ?Intake/Output this shift: ?No intake/output data recorded. ? ? ?Alert; oriented to person, place, and time ?MAE, Generalized weakness ?Decreased fine motor BUE ?Incision is well-healed ?  ? ?Lab Results: ?No results for input(s): WBC, HGB, HCT, PLT in the last 72 hours. ?BMET ?No results for input(s): NA, K, CL, CO2, GLUCOSE, BUN, CREATININE, CALCIUM in the last 72 hours. ? ?Studies/Results: ?No results found. ? ?Assessment/Plan: ?Patient is doing well after a very complicated hospital course following four level ACDF. He would like to discharge home. Continue efforts at mobilization. ? ? ? LOS: 103 days  ? ? ? ? ?Val Eagle, DNP, AGNP-C ?Nurse Practitioner ? ?[facility] ?[address], [place], [place] [address] ?P: [phone]    F: [phone] ? ?10/10/2021, 9:41 AM ? ? ? ? ?

## 2021-10-10 NOTE — Progress Notes (Signed)
Physical Therapy Treatment ?Patient Details ?Name: Brian [name] ?MRN: [id number] ?DOB: 02-15-1961 ?Today's Date: 10/10/2021 ? ? ?History of Present Illness Pt is a 61 y/o male admitted 06/29/21 following C3-7 ACDF after progressive cervical myelopathy symptoms including ataxia, bil UE and LE shaking and weakness, and loss of function in his hands with subsequent fall. Pt initially had improvement in neurological symptoms, but postoperative course was complicated by dysphasia and ataxia secondary to and epidural hematoma with evacuation of hematoma on 2/7. Course complicated by delirium secondary to encephalopathy, sepsis secondary to aspiration PNA, reintubated 2/19-2/26 for airway protection, bilat segmental PEs 2/18. PEG placed 3/6. PMHx includes tobacco and EtOH use disorder. ? ?  ?PT Comments  ? ? Pt continues to make good mobility progress. He is eager and motivated to return home. He required supervision bed mobility, min guard assist transfers, and min guard assist ambulation 500' with RW. Continual verbal cues needed during gait for posture, proximity to RW, and speed. Pt tends to walk too fast, deteriorating gait quality and increasing risk for falls. Pt in recliner at end of session. ?   ?Recommendations for follow up therapy are one component of a multi-disciplinary discharge planning process, led by the attending physician.  Recommendations may be updated based on patient status, additional functional criteria and insurance authorization. ? ?Follow Up Recommendations ? Acute inpatient rehab (3hours/day) ?  ?  ?Assistance Recommended at Discharge Frequent or constant Supervision/Assistance  ?Patient can return home with the following A little help with walking and/or transfers;A little help with bathing/dressing/bathroom;Assistance with cooking/housework;Assist for transportation;Help with stairs or ramp for entrance ?  ?Equipment Recommendations ? Rolling walker (2 wheels);Wheelchair (measurements  PT);Wheelchair cushion (measurements PT)  ?  ?Recommendations for Other Services   ? ? ?  ?Precautions / Restrictions Precautions ?Precautions: Fall ?Restrictions ?Weight Bearing Restrictions: No  ?  ? ?Mobility ? Bed Mobility ?Overal bed mobility: Needs Assistance ?Bed Mobility: Supine to Sit ?Rolling: Modified independent (Device/Increase time) ?  ?Supine to sit: Supervision, HOB elevated ?  ?  ?General bed mobility comments: increased time, +rail ?  ? ?Transfers ?Overall transfer level: Needs assistance ?Equipment used: Rolling walker (2 wheels) ?Transfers: Sit to/from Stand ?Sit to Stand: Min guard ?  ?  ?  ?  ?  ?  ?  ? ?Ambulation/Gait ?Ambulation/Gait assistance: Min guard ?Gait Distance (Feet): 500 Feet ?Assistive device: Rolling walker (2 wheels) ?Gait Pattern/deviations: Step-through pattern ?  ?Gait velocity interpretation: >2.62 ft/sec, indicative of community ambulatory ?  ?General Gait Details: Cues for posture and proximity to RW. Improved gait quality with slower cadence but pt tends to walk fast. Gait degraded with fatigue. ? ? ?Stairs ?  ?  ?  ?  ?  ? ? ?Wheelchair Mobility ?  ? ?Modified Rankin (Stroke Patients Only) ?  ? ? ?  ?Balance Overall balance assessment: Needs assistance ?Sitting-balance support: Feet supported, No upper extremity supported ?Sitting balance-Leahy Scale: Fair ?  ?  ?Standing balance support: Bilateral upper extremity supported, During functional activity, Single extremity supported, Reliant on assistive device for balance ?Standing balance-Leahy Scale: Poor ?  ?  ?  ?  ?  ?  ?  ?  ?  ?  ?  ?  ?  ? ?  ?Cognition Arousal/Alertness: Awake/alert ?Behavior During Therapy: [facility] for tasks assessed/performed ?Overall Cognitive Status: Within Functional Limits for tasks assessed ?  ?  ?  ?  ?  ?  ?  ?  ?  ?  ?  ?  ?  ?  ?  ?  ?  ?  ?  ? ?  ?  Exercises   ? ?  ?General Comments   ?  ?  ? ?Pertinent Vitals/Pain Pain Assessment ?Pain Assessment: No/denies pain  ? ? ?Home Living   ?  ?  ?   ?  ?  ?  ?  ?  ?  ?   ?  ?Prior Function    ?  ?  ?   ? ?PT Goals (current goals can now be found in the care plan section) Acute Rehab PT Goals ?Patient Stated Goal: to walk more.  To get home. ?Progress towards PT goals: Progressing toward goals ? ?  ?Frequency ? ? ? Min 5X/week ? ? ? ?  ?PT Plan Current plan remains appropriate  ? ? ?Co-evaluation   ?  ?  ?  ?  ? ?  ?AM-PAC PT "6 Clicks" Mobility   ?Outcome Measure ? Help needed turning from your back to your side while in a flat bed without using bedrails?: A Little ?Help needed moving from lying on your back to sitting on the side of a flat bed without using bedrails?: A Little ?Help needed moving to and from a bed to a chair (including a wheelchair)?: A Little ?Help needed standing up from a chair using your arms (e.g., wheelchair or bedside chair)?: A Little ?Help needed to walk in hospital room?: A Little ?Help needed climbing 3-5 steps with a railing? : A Little ?6 Click Score: 18 ? ?  ?End of Session Equipment Utilized During Treatment: Gait belt ?Activity Tolerance: Patient tolerated treatment well ?Patient left: in chair;with call bell/phone within reach;with chair alarm set ?Nurse Communication: Mobility status ?PT Visit Diagnosis: Other abnormalities of gait and mobility (R26.89);Other symptoms and signs involving the nervous system (R29.898) ?  ? ? ?Time: [id number] ?PT Time Calculation (min) (ACUTE ONLY): 24 min ? ?Charges:  $Gait Training: 23-37 mins          ?          ? ?Lorrin Goodell, PT  ?Office # [phone] ?Pager [phone] ? ? ? ?Lorriane Shire ?10/10/2021, 12:07 PM ? ?

## 2021-10-10 NOTE — Progress Notes (Signed)
[facility] ?PROGRESS NOTE ? ?Patient: Brian [name] [id number]   ?PCP: Pcp, No DOB: 31-Mar-1961   ?DOA: 06/29/2021   DOS: 10/10/2021   ? ?Subjective: No acute complaint.  No nausea no vomiting no fever no chills. ? ?Objective:  ?Vitals:  ? 10/10/21 1100 10/10/21 1500  ?BP: 110/70 114/75  ?Pulse: 90 89  ?Resp: 16 16  ?Temp:  97.9 ?F (36.6 ?C)  ?SpO2: 100% 100%  ?  ?S1-S2 present ?No edema. ? ?Assessment and plan: ?Remains medically stable for discharge although disposition still a challenge as the patient still having significant weakness for successful transition to home. ?Patient is showing daily improvement for distance walked as well as assistant required. ? ?Author: ?Lynden Oxford, MD ?[facility] ?10/10/2021 6:13 PM   ?If 7PM-7AM, please contact night-coverage at [email or web address]  ?

## 2021-10-11 LAB — CBC WITH DIFFERENTIAL/PLATELET
Abs Immature Granulocytes: 0 10*3/uL (ref 0.00–0.07)
Basophils Absolute: 0 10*3/uL (ref 0.0–0.1)
Basophils Relative: 0 %
Eosinophils Absolute: 0.1 10*3/uL (ref 0.0–0.5)
Eosinophils Relative: 2 %
HCT: 31.5 % — ABNORMAL LOW (ref 39.0–52.0)
Hemoglobin: 10 g/dL — ABNORMAL LOW (ref 13.0–17.0)
Immature Granulocytes: 0 %
Lymphocytes Relative: 63 %
Lymphs Abs: 2.5 10*3/uL (ref 0.7–4.0)
MCH: 29.9 pg (ref 26.0–34.0)
MCHC: 31.7 g/dL (ref 30.0–36.0)
MCV: 94.3 fL (ref 80.0–100.0)
Monocytes Absolute: 0.5 10*3/uL (ref 0.1–1.0)
Monocytes Relative: 12 %
Neutro Abs: 0.9 10*3/uL — ABNORMAL LOW (ref 1.7–7.7)
Neutrophils Relative %: 23 %
Platelets: 283 10*3/uL (ref 150–400)
RBC: 3.34 MIL/uL — ABNORMAL LOW (ref 4.22–5.81)
RDW: 15.6 % — ABNORMAL HIGH (ref 11.5–15.5)
WBC: 4 10*3/uL (ref 4.0–10.5)
nRBC: 0 % (ref 0.0–0.2)

## 2021-10-11 LAB — BASIC METABOLIC PANEL
Anion gap: 7 (ref 5–15)
BUN: 17 mg/dL (ref 6–20)
CO2: 29 mmol/L (ref 22–32)
Calcium: 10.1 mg/dL (ref 8.9–10.3)
Chloride: 104 mmol/L (ref 98–111)
Creatinine, Ser: 1.01 mg/dL (ref 0.61–1.24)
GFR, Estimated: >60 mL/min (ref >60)
Glucose, Bld: 107 mg/dL — ABNORMAL HIGH (ref 70–99)
Potassium: 4.2 mmol/L (ref 3.5–5.1)
Sodium: 140 mmol/L (ref 135–145)

## 2021-10-11 LAB — GLUCOSE, CAPILLARY
Glucose-Capillary: 74 mg/dL (ref 70–99)
Glucose-Capillary: 220 mg/dL — ABNORMAL HIGH (ref 70–99)

## 2021-10-11 NOTE — Progress Notes (Signed)
Physical Therapy Treatment Patient Details Name: Brian [name] Brian [name] Brian [name], Brian [name] Today's Date: 10/11/2021   History of Present Illness Pt is a 61 y/o male admitted 06/29/21 following C3-7 ACDF after progressive cervical myelopathy symptoms including ataxia, bil UE and LE shaking and weakness, and loss of function in his hands with subsequent fall. Pt initially had improvement in neurological symptoms, but postoperative course was complicated by dysphasia and ataxia secondary to and epidural hematoma with evacuation of hematoma on 2/7. Course complicated by delirium secondary to encephalopathy, sepsis secondary to aspiration PNA, reintubated 2/19-2/26 for airway protection, bilat segmental PEs 2/18. PEG placed 3/6. PMHx includes tobacco and EtOH use disorder.    PT Comments    Continues to gait better awareness of what it will take technique-wize to stay safe.  Emphasis on gait stability/quality and stamina.    Recommendations for follow up therapy are one component of a multi-disciplinary discharge planning process, led by the attending physician.  Recommendations may be updated based on patient status, additional functional criteria and insurance authorization.  Follow Up Recommendations  Acute inpatient rehab (3hours/day)     Assistance Recommended at Discharge Frequent or constant Supervision/Assistance  Patient can return home with the following A little help with walking and/or transfers;A little help with bathing/dressing/bathroom;Assistance with cooking/housework;Assist for transportation;Help with stairs or ramp for entrance   Equipment Recommendations  Rolling walker (2 wheels);Wheelchair (measurements PT);Wheelchair cushion (measurements PT)    Recommendations for Other Services Rehab consult     Precautions / Restrictions Precautions Precautions: Fall     Mobility  Bed Mobility               General bed mobility comments: OOB on arrival     Transfers Overall transfer level: Needs assistance Equipment used: Rolling walker (2 wheels) Transfers: Sit to/from Stand Sit to Stand: Min guard                Ambulation/Gait Ambulation/Gait assistance: Min guard Gait Distance (Feet): 500 Feet Assistive device: Rolling walker (2 wheels) Gait Pattern/deviations: Step-through pattern   Gait velocity interpretation: <1.31 ft/sec, indicative of household ambulator   General Gait Details: Cues for posture and proximity to RW. Improved gait quality with slower cadence, pt learning slowly how to control speed. Gait degraded with fatigue.   Stairs             Wheelchair Mobility    Modified Rankin (Stroke Patients Only)       Balance     Sitting balance-Leahy Scale: Fair       Standing balance-Leahy Scale: Poor                              Cognition Arousal/Alertness: Awake/alert Behavior During Therapy: WFL for tasks assessed/performed Overall Cognitive Status: Within Functional Limits for tasks assessed                                          Exercises      General Comments        Pertinent Vitals/Pain Pain Assessment Pain Assessment: Faces Faces Pain Scale: Hurts a little bit Pain Location: general Pain Descriptors / Indicators: Sore Pain Intervention(s): Monitored during session    Home Living  Prior Function            PT Goals (current goals can now be found in the care plan section) Acute Rehab PT Goals PT Goal Formulation: With patient Time For Goal Achievement: 10/15/21 Potential to Achieve Goals: Good Progress towards PT goals: Progressing toward goals    Frequency    Min 5X/week      PT Plan Current plan remains appropriate    Co-evaluation              AM-PAC PT "6 Clicks" Mobility   Outcome Measure  Help needed turning from your back to your side while in a flat bed without using  bedrails?: A Little Help needed moving from lying on your back to sitting on the side of a flat bed without using bedrails?: A Little Help needed moving to and from a bed to a chair (including a wheelchair)?: A Little Help needed standing up from a chair using your arms (e.g., wheelchair or bedside chair)?: A Little Help needed to walk in hospital room?: A Little Help needed climbing 3-5 steps with a railing? : A Little 6 Click Score: 18    End of Session   Activity Tolerance: Patient tolerated treatment well Patient left: in chair;with call bell/phone within reach;with chair alarm set Nurse Communication: Mobility status PT Visit Diagnosis: Other abnormalities of gait and mobility (R26.89);Other symptoms and signs involving the nervous system [id number])     Time: [id number] PT Time Calculation (min) (ACUTE ONLY): 15 min  Charges:  $Gait Training: 8-22 mins                     10/11/2021  Ginger Carne., PT Acute Rehabilitation Services [phone]  (pager) [phone]  (office)   [name] Lucindy Borel 10/11/2021, 6:21 PM

## 2021-10-11 NOTE — Progress Notes (Signed)
[facility] PROGRESS NOTE  Patient: HALTON NEAS [id number]   PCP: Pcp, No DOB: 05-11-1961   DOA: 06/29/2021   DOS: 10/11/2021    Subjective: RN reported staring spell while eating lunch. At the time of my evaluation patient able to follow commands on the questions appropriately no focal deficit.  Objective:  Vitals:   10/11/21 1242 10/11/21 1614  BP: 111/79 100/74  Pulse: 84 85  Resp: 18 18  Temp: 98.8 F (37.1 C) 98.1 F (36.7 C)  SpO2: 100% 100%  Clear to auscultation. S1-S2 present. Equal upper extremity strength.  No dysarthria.  No dysphagia  Assessment and plan: Staring spell likely due to mild hypoglycemia Monitor CBG. Labs appears to be adequate. Monitor.  Author: Lynden Oxford, MD [facility] 10/11/2021 7:00 PM   If 7PM-7AM, please contact night-coverage at [email or web address]

## 2021-10-11 NOTE — Progress Notes (Signed)
This RN went into patient's room while he was eating lunch. Mid-conversation, he started to have a blank stare, fixated to the right and was slow to respond to questions but still oriented x4. He stated he felt fine but continued to have brief intermittent staring episodes over the next 10 minutes or so. Dr. Allena Katz notified who came to bedside. Vitals checked and stable. CBG 74, recheck 220 after finishing lunch and Boost. Patient no longer having staring episodes, quick to respond, and appears comfortable.

## 2021-10-11 NOTE — Progress Notes (Signed)
   Providing Compassionate, Quality Care - Together   Subjective: Patient reports no issues overnight.  Objective: Vital signs in last 24 hours: Temp:  [97.8 F (36.6 C)-98.3 F (36.8 C)] 98.3 F (36.8 C) (05/18 0817) Pulse Rate:  [81-90] 82 (05/18 0817) Resp:  [15-18] 18 (05/18 0817) BP: (104-115)/(69-82) 115/82 (05/18 0817) SpO2:  [87 %-100 %] 99 % (05/18 0817) Weight:  [56.6 kg] 56.6 kg (05/18 0404)  Intake/Output from previous day: 05/17 0701 - 05/18 0700 In: 390 [P.O.:390] Out: 500 [Urine:500] Intake/Output this shift: Total I/O In: -  Out: 500 [Urine:500]  Alert; oriented to person, place, and time MAE, Generalized weakness Decreased fine motor BUE Incision is well-healed   Lab Results: No results for input(s): WBC, HGB, HCT, PLT in the last 72 hours. BMET No results for input(s): NA, K, CL, CO2, GLUCOSE, BUN, CREATININE, CALCIUM in the last 72 hours.  Studies/Results: No results found.  Assessment/Plan: Patient is doing well after a very complicated hospital course following four level ACDF. He would like to discharge home. Continue efforts at mobilization.   LOS: 104 days     Val Eagle, DNP, AGNP-C Nurse Practitioner  [facility] [address], [address], [place], [place] [address] P: [phone]    F: [phone]  10/11/2021, 10:55 AM

## 2021-10-12 NOTE — Progress Notes (Signed)
   Providing Compassionate, Quality Care - Together     Provider portion of FMLA paperwork filled out and returned to patient.     Viona Gilmore, DNP, AGNP-C Nurse Practitioner  [facility] [place] [address], [place], [place] [address] P: [phone]    F: [phone]   10/12/2021, 10:10 AM

## 2021-10-12 NOTE — Progress Notes (Signed)
Physical Therapy Treatment Patient Details Name: Brian [name] MRN: [id number] DOB: 04-18-1961 Today's Date: 10/12/2021   History of Present Illness Pt is a 61 y/o male admitted 06/29/21 following C3-7 ACDF after progressive cervical myelopathy symptoms including ataxia, bil UE and LE shaking and weakness, and loss of function in his hands with subsequent fall. Pt initially had improvement in neurological symptoms, but postoperative course was complicated by dysphasia and ataxia secondary to and epidural hematoma with evacuation of hematoma on 2/7. Course complicated by delirium secondary to encephalopathy, sepsis secondary to aspiration PNA, reintubated 2/19-2/26 for airway protection, bilat segmental PEs 2/18. PEG placed 3/6. PMHx includes tobacco and EtOH use disorder.    PT Comments    Pt received in supine, agreeable to therapy session with plan for transfer training and problem solving floor>chair transfers (in event of falls while home alone). Pt needing max multimodal cues for problem solving transfer from floor>rolling>quadruped and maxA to place forearms up on chair surface, then totalA +2 to stand from kneeling at chair and pivoting to sit in chair. Pt afterward performed gait trials x2 household distance with up to [place] with max cues for stairs training with BUE support. Pt with unsafe foot placement on stairs needing some manual assist for improved posture/technique. Pt with decreased insight into fatigue and need for rest breaks and continues to demonstrate eagerness to progress strength/endurance. Pt continues to benefit from PT services to progress toward functional mobility goals.   Recommendations for follow up therapy are one component of a multi-disciplinary discharge planning process, led by the attending physician.  Recommendations may be updated based on patient status, additional functional criteria and insurance authorization.  Follow Up Recommendations  Acute  inpatient rehab (3hours/day)     Assistance Recommended at Discharge Frequent or constant Supervision/Assistance  Patient can return home with the following A little help with walking and/or transfers;A little help with bathing/dressing/bathroom;Assistance with cooking/housework;Assist for transportation;Help with stairs or ramp for entrance   Equipment Recommendations  Rolling walker (2 wheels);Wheelchair (measurements PT);Wheelchair cushion (measurements PT)    Recommendations for Other Services Rehab consult     Precautions / Restrictions Precautions Precautions: Fall Precaution Comments: previous G-tube site (pt pulled it out on evening of 4/29) Restrictions Weight Bearing Restrictions: No     Mobility  Bed Mobility Overal bed mobility: Needs Assistance Bed Mobility: Supine to Sit     Supine to sit: Supervision     General bed mobility comments: increased time to perform, HOB lowered during process to simulate home environment    Transfers Overall transfer level: Needs assistance Equipment used: Rolling walker (2 wheels), 2 person hand held assist Transfers: Sit to/from Stand Sit to Stand: Min guard, Min assist   Step pivot transfers: Min assist, +2 physical assistance (via HHA from bed>recliner chair)       General transfer comment: good power up, good hand placement standing to RW; increased assist needed with HHA (pivoting to chair to try transfer from chair>floor) as this technique is unfamiliar to him and he was perseverating on difficulty of planned activity    Ambulation/Gait Ambulation/Gait assistance: Min guard, Min assist Gait Distance (Feet): 300 Feet (150 x2 including seated break in gym) Assistive device: Rolling walker (2 wheels) Gait Pattern/deviations: Step-through pattern       General Gait Details: Cues for posture and proximity to RW, pt given visual demo while seated in chair midway through trial and pt with improved carryover of cues for  RW proximity after  visual demo. Gait degraded with fatigue.   Stairs Stairs: Yes Stairs assistance: Min assist Stair Management: Two rails, Step to pattern, Forwards, Backwards Number of Stairs: 2 General stair comments: uncoordinated step to pattern, pt tending to maintain hip/knee flexion/stiff posture when stepping down (poor hip extension strength) and needing manual assist at times for safer foot placement on step prior to bringing other foot back down. forward facing ascent, backward facing to descend.     Balance Overall balance assessment: Needs assistance Sitting-balance support: Feet supported, No upper extremity supported Sitting balance-Leahy Scale: Fair Sitting balance - Comments: anterior lean when unsupported   Standing balance support: Reliant on assistive device for balance, Bilateral upper extremity supported Standing balance-Leahy Scale: Poor Standing balance comment: reliant on UE's.                            Cognition Arousal/Alertness: Awake/alert Behavior During Therapy: WFL for tasks assessed/performed Overall Cognitive Status: Within Functional Limits for tasks assessed Area of Impairment: Safety/judgement, Problem solving                         Safety/Judgement: Decreased awareness of deficits, Decreased awareness of safety   Problem Solving: Difficulty sequencing, Requires verbal cues General Comments: Pt demos difficulty with "out of the box" thinking/problem solving in novel scenarios (had pt attempt transfer up from floor today). Pt putting forth some effort but quick to state "I can't do it" despite step by step sequencing instruction on rolling, to quadruped position, then forearms on chair/bed/etc. Discussion with pt on problem solving scenario if he was on floor, he states "I will need a life alert button" but also encouraged him to consider army crawling to a door he could open to yell out of if necessary. Pt initially not  receptive but able to demonstrate some attention to task.        Exercises Other Exercises Other Exercises: Reviewed chair push-ups using UE/LE but pt did not perform due to notable fatigue at end of session (although pt denies fatigue)    General Comments General comments (skin integrity, edema, etc.): HR to 120 bpm with exertion (after transfer from floor>chair) and decreases over a few minutes to ~100 bpm, SpO2 WFL on RA      Pertinent Vitals/Pain Pain Assessment Pain Assessment: Faces Faces Pain Scale: Hurts little more Pain Location: hips/knees (pillow under knees) where they are contacting floor during attempt at floor>chair transfer Pain Descriptors / Indicators: Sore, Discomfort, Grimacing Pain Intervention(s): Monitored during session, Repositioned     PT Goals (current goals can now be found in the care plan section) Acute Rehab PT Goals Patient Stated Goal: to go home and get back to work PT Goal Formulation: With patient Time For Goal Achievement: 10/15/21 Progress towards PT goals: Progressing toward goals    Frequency    Min 5X/week      PT Plan Current plan remains appropriate       AM-PAC PT "6 Clicks" Mobility   Outcome Measure  Help needed turning from your back to your side while in a flat bed without using bedrails?: A Little Help needed moving from lying on your back to sitting on the side of a flat bed without using bedrails?: A Little Help needed moving to and from a bed to a chair (including a wheelchair)?: A Little Help needed standing up from a chair using your arms (e.g., wheelchair or bedside  chair)?: A Little Help needed to walk in hospital room?: A Lot (mod cues today) Help needed climbing 3-5 steps with a railing? : A Lot (mod cues today) 6 Click Score: 16    End of Session Equipment Utilized During Treatment: Gait belt Activity Tolerance: Patient tolerated treatment well Patient left: in chair;with call bell/phone within reach;with  chair alarm set Nurse Communication: Mobility status;Other (comment) (pt asking to have a Boost) PT Visit Diagnosis: Other abnormalities of gait and mobility (R26.89);Other symptoms and signs involving the nervous system (R29.898)     Time: [id number] PT Time Calculation (min) (ACUTE ONLY): 43 min  Charges:  $Gait Training: 8-22 mins $Therapeutic Activity: 23-37 mins                     Schyler Butikofer P., PTA Acute Rehabilitation Services Secure Chat Preferred 9a-5:30pm Office: [place] 10/12/2021, 12:24 PM

## 2021-10-12 NOTE — Plan of Care (Signed)
Patient is A&O X4, has no contraptions and comfortable, denies any discomfort or pain throughout the shift. VS stable. Will continue to monitor.    Problem: Education: Goal: Knowledge of General Education information will improve Description: Including pain rating scale, medication(s)/side effects and non-pharmacologic comfort measures Outcome: Progressing   Problem: Clinical Measurements: Goal: Will remain free from infection Outcome: Progressing   Problem: Activity: Goal: Risk for activity intolerance will decrease Outcome: Progressing   Problem: Nutrition: Goal: Adequate nutrition will be maintained Outcome: Progressing   Problem: Elimination: Goal: Will not experience complications related to urinary retention Outcome: Progressing   Problem: Safety: Goal: Ability to remain free from injury will improve Outcome: Progressing

## 2021-10-12 NOTE — Progress Notes (Signed)
   Providing [facility], [facility] course: Patient status post C3-4, C4-5, C5-6, C6-7 anterior cervical discectomy with interbody fusion by Dr. Annette Stable on 06/29/2021. Increased difficulty swallowing with lung atelectasis and elevated temperatures on 07/02/2021. Made NPO following MBS by SLP. 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 CTA head and neck negative for stroke. MRI revealed epidural hematoma. Patient underwent exploration of his cervical fusion with evacuation of the epidural hematoma on 07/03/2021. Cortrak was placed on 07/04/2021. Patient's strength much improved since epidural hematoma evacuation. Cortrak removed 07/06/2021 and dysphagia 2 diet with nectar thick liquids started. Patient with delirium vs ETOH withdrawal. Discontinued steroids 07/06/2021. CIWA protocol started and discontinued 07/07/2021. CT and MRI 07/07/2021 negative. Patient developed respiratory distress, requiring intubation on 07/15/2021. Work up revealed aspiration PNA and PE. Patient was extubated on 07/22/2021. Foley catheter removed 07/24/2021. Patient transferred to 3W on 07/26/2021. Foley replaced 07/28/2021. PEG placed 07/30/2021. Foley removed 08/09/2021. PEG pulled out by patient 08/16/2021. Delirium worsening 08/17/2021. Haldol administered. IR replaced PEG on 08/17/2021. Patient with urosepsis 08/18/2021. Culture grew E. Coli. C/o LBP 08/28/2021. Lumbar MRI demonstrated chronic degenerative changes with spinal stenosis at L3-4, L4-5, and L5-S1. Patient suffered a fall overnight 08/29/2021 without injury. Worsening muscle spasms 09/11/2021, baclofen started. Patient transitioned to regular diet 09/12/2021. Episode of emesis overnight 4/19-4/20. No further episodes of emesis since 4/20. KUB 4/20 just demonstrated a nonobstructive bowel gas pattern. Fall without injury reported 09/16/2021. Foley catheter removed again. Another fall 09/19/2021. No injury per report, but per day  nurse c/o left lower back pain. Episode of emesis in the morning on 09/20/2021. PEG pulled out 09/21/2021. PEG remains out.  Subjective: Patient reports no issues overnight.  Objective: Vital signs in last 24 hours: Temp:  [98 F (36.7 C)-98.8 F (37.1 C)] 98.3 F (36.8 C) (05/19 0856) Pulse Rate:  [84-104] 96 (05/19 0856) Resp:  [16-18] 16 (05/19 0856) BP: (100-112)/(74-81) 106/81 (05/19 0856) SpO2:  [99 %-100 %] 100 % (05/19 0856) Weight:  [56.1 kg] 56.1 kg (05/19 0436)  Intake/Output from previous day: 05/18 0701 - 05/19 0700 In: -  Out: 900 [Urine:900] Intake/Output this shift: Total I/O In: -  Out: 300 [Urine:300]    Alert; oriented to person, place, and time MAE, Generalized weakness Decreased fine motor BUE Incision is well-healed    Lab Results: Recent Labs    10/11/21 1403  WBC 4.0  HGB 10.0*  HCT 31.5*  PLT 283   BMET Recent Labs    10/11/21 1403  NA 140  K 4.2  CL 104  CO2 29  GLUCOSE 107*  BUN 17  CREATININE 1.01  CALCIUM 10.1    Studies/Results: No results found.  Assessment/Plan: Patient is doing well after a very complicated hospital course following four level ACDF. He would like to discharge home. Continue efforts at mobilization.   LOS: 105 days     Viona Gilmore, DNP, AGNP-C Nurse Practitioner  [facility] [place] [address], [place], [place] [address] P: [phone]    F: [phone]  10/12/2021, 10:07 AM

## 2021-10-12 NOTE — Progress Notes (Signed)
This nurse assuming care.  Pt laying in bed, awake, alert.  Denies pain or discomfort.  No acute distress noted.  Bed low, call light in reach.

## 2021-10-12 NOTE — TOC Progression Note (Signed)
Transition of Care [facility]) - Progression Note    Patient Details  Name: Brian [name] MRN: [id number] Date of Birth: 1960-07-12  Transition of Care [facility]) CM/SW [place], [place] Phone Number: 10/12/2021, 3:50 PM  Clinical Narrative:   CSW met with patient to assist with completing FMLA paperwork, as patient is unable to write. CSW completed form with patient and faxed to [employer], returned paperwork back to patient. Patient also asked for an update on his disability, and CSW reached out to financial counseling who will call patient. Patient appreciative of assistance. CSW to follow.      Barriers to Discharge: SNF Pending bed offer, SNF Pending payor source - LOG, SNF Pending Medicaid, Inadequate or no insurance  Expected Discharge Plan and Services   In-house Referral: Clinical Social Work                                             Social Determinants of Health (SDOH) Interventions    Readmission Risk Interventions     View : No data to display.

## 2021-10-13 NOTE — Progress Notes (Signed)
[facility] PROGRESS NOTE  Patient: Brian [name] [id number]   PCP: Pcp, No DOB: Nov 28, 1960   DOA: 06/29/2021   DOS: 10/13/2021   Primary team neurosurgery.  Subjective: Denies any acute complaint.  No nausea or vomiting.  Ambulating in the hallway with tach.  Objective:   S1-S2 present. Clear to auscultation. No recent weight.  Assessment and plan: Active Issues Requesting to check his weight tomorrow morning before breakfast.  Especially in the setting of hypoglycemic episode a few days ago. Continue current regimen.  Dispo per primary team, medically stable.  Brief Hospital course Brian [name] is a 61 year old male with past medical history significant for tobacco and EtOH use disorder who was admitted by neurosurgery for cervical myelopathy due to critical multilevel cervical spinal canal stenosis C3-6 with spinal cord compression and spinal cord signal change after recent fall with progressive upper and lower extremity weakness and tremors. Pt transferred to the floor on 3/2 and PCCM requested transition to [facility] for medical assistance while patient remains under the neurosurgery service.  2/03 ACDF C3-4, C4-5, C5-5, and C6-7 w/ Dr. Annette Stable 2/07 Code stroke overnight, neg for LVO, CT showing soft tissue swelling. PCCM consulted for concern of airway management and AMS. Went for evacuation of epidural hematoma  2/18 CT Abd/Pelvis: showing bilateral segmental Pes, started on heparin and showing worsening pneumonia, febrile to  103.  PCT rising, 34.6, restarted on abx 2/19 possible aspiration w/ severe hypoxia; intubated; Switching from heparin to angiomax given subtherapeutic levels despite increase in rate. 2/23 Ketamine infusion added 2/26 extubated 3/02 Modified barium swallow, continues n.p.o. with core track in place,  3/06 s/p IR G-tube placement 3/24 E. coli UTI/bacteremia>> cefazolin x7 days per ID 4/14 repeat urine culture + Citrobacter Freundii that is  Sensitive to Bactrim,  4/16 reinserted foley due to recurrent urinary retention 4/28 accidentally pulled out PEG tube the evening.   Tolerating oral diet with 3 times daily Premier protein shake Medically stable.  Awaiting placement/improvement with mobility to be discharged home.   Principal Problem:   Cervical myelopathy ([place]); severe cervical spinal stenosis with cord compression s/p ACDF [id number] on 2/3, complicated by epidural hematoma s/p evacuation 2/7 Active Problems:   Acute respiratory failure with hypoxia (HCC)   Acute pulmonary embolism (HCC)   Acute metabolic encephalopathy   Severe sepsis due to recurrent aspiration pneumonia   E. coli bacteremia   Sinus tachycardia   Elevated liver enzymes   Hyponatremia   Tobacco use disorder   Physical deconditioning   Urinary retention   Protein-calorie malnutrition, severe (HCC)   Lumbar back pain   Fall during current hospitalization   Normocytic anemia   Thrombocytosis   Citrobacter infection   Vomiting   Abnormal LFTs   Aspiration pneumonia [facility])    Author: Berle Mull, MD [facility] 10/13/2021 5:03 PM   If 7PM-7AM, please contact night-coverage at [email or web address]

## 2021-10-13 NOTE — Progress Notes (Signed)
Patient ID: Brian [name], male   DOB: 06/02/1960, 61 y.o.   MRN: [id number] BP 107/80 (BP Location: Right Arm)   Pulse 90   Temp 98.5 F (36.9 C) (Oral)   Resp 17   Ht 5\' 10"  (1.778 m)   Wt 55.2 kg   SpO2 100%   BMI 17.46 kg/m  Alert and oriented x 4 Better coordination Continues to work had with therapy

## 2021-10-13 NOTE — Plan of Care (Signed)

## 2021-10-14 ENCOUNTER — Inpatient Hospital Stay (HOSPITAL_COMMUNITY): Payer: 59

## 2021-10-14 LAB — MAGNESIUM: Magnesium: 1.9 mg/dL (ref 1.7–2.4)

## 2021-10-14 LAB — BASIC METABOLIC PANEL
Anion gap: 7 (ref 5–15)
BUN: 15 mg/dL (ref 6–20)
CO2: 30 mmol/L (ref 22–32)
Calcium: 10.5 mg/dL — ABNORMAL HIGH (ref 8.9–10.3)
Chloride: 103 mmol/L (ref 98–111)
Creatinine, Ser: 0.95 mg/dL (ref 0.61–1.24)
GFR, Estimated: >60 mL/min (ref >60)
Glucose, Bld: 104 mg/dL — ABNORMAL HIGH (ref 70–99)
Potassium: 3.9 mmol/L (ref 3.5–5.1)
Sodium: 140 mmol/L (ref 135–145)

## 2021-10-14 IMAGING — DX DG ABD PORTABLE 1V
1 series · 1 of 1 positions shown · non-contrast
Comparison: None Available.

CLINICAL DATA: Cough, vomiting

EXAM:
PORTABLE ABDOMEN - 1 VIEW

[abdomen supine]
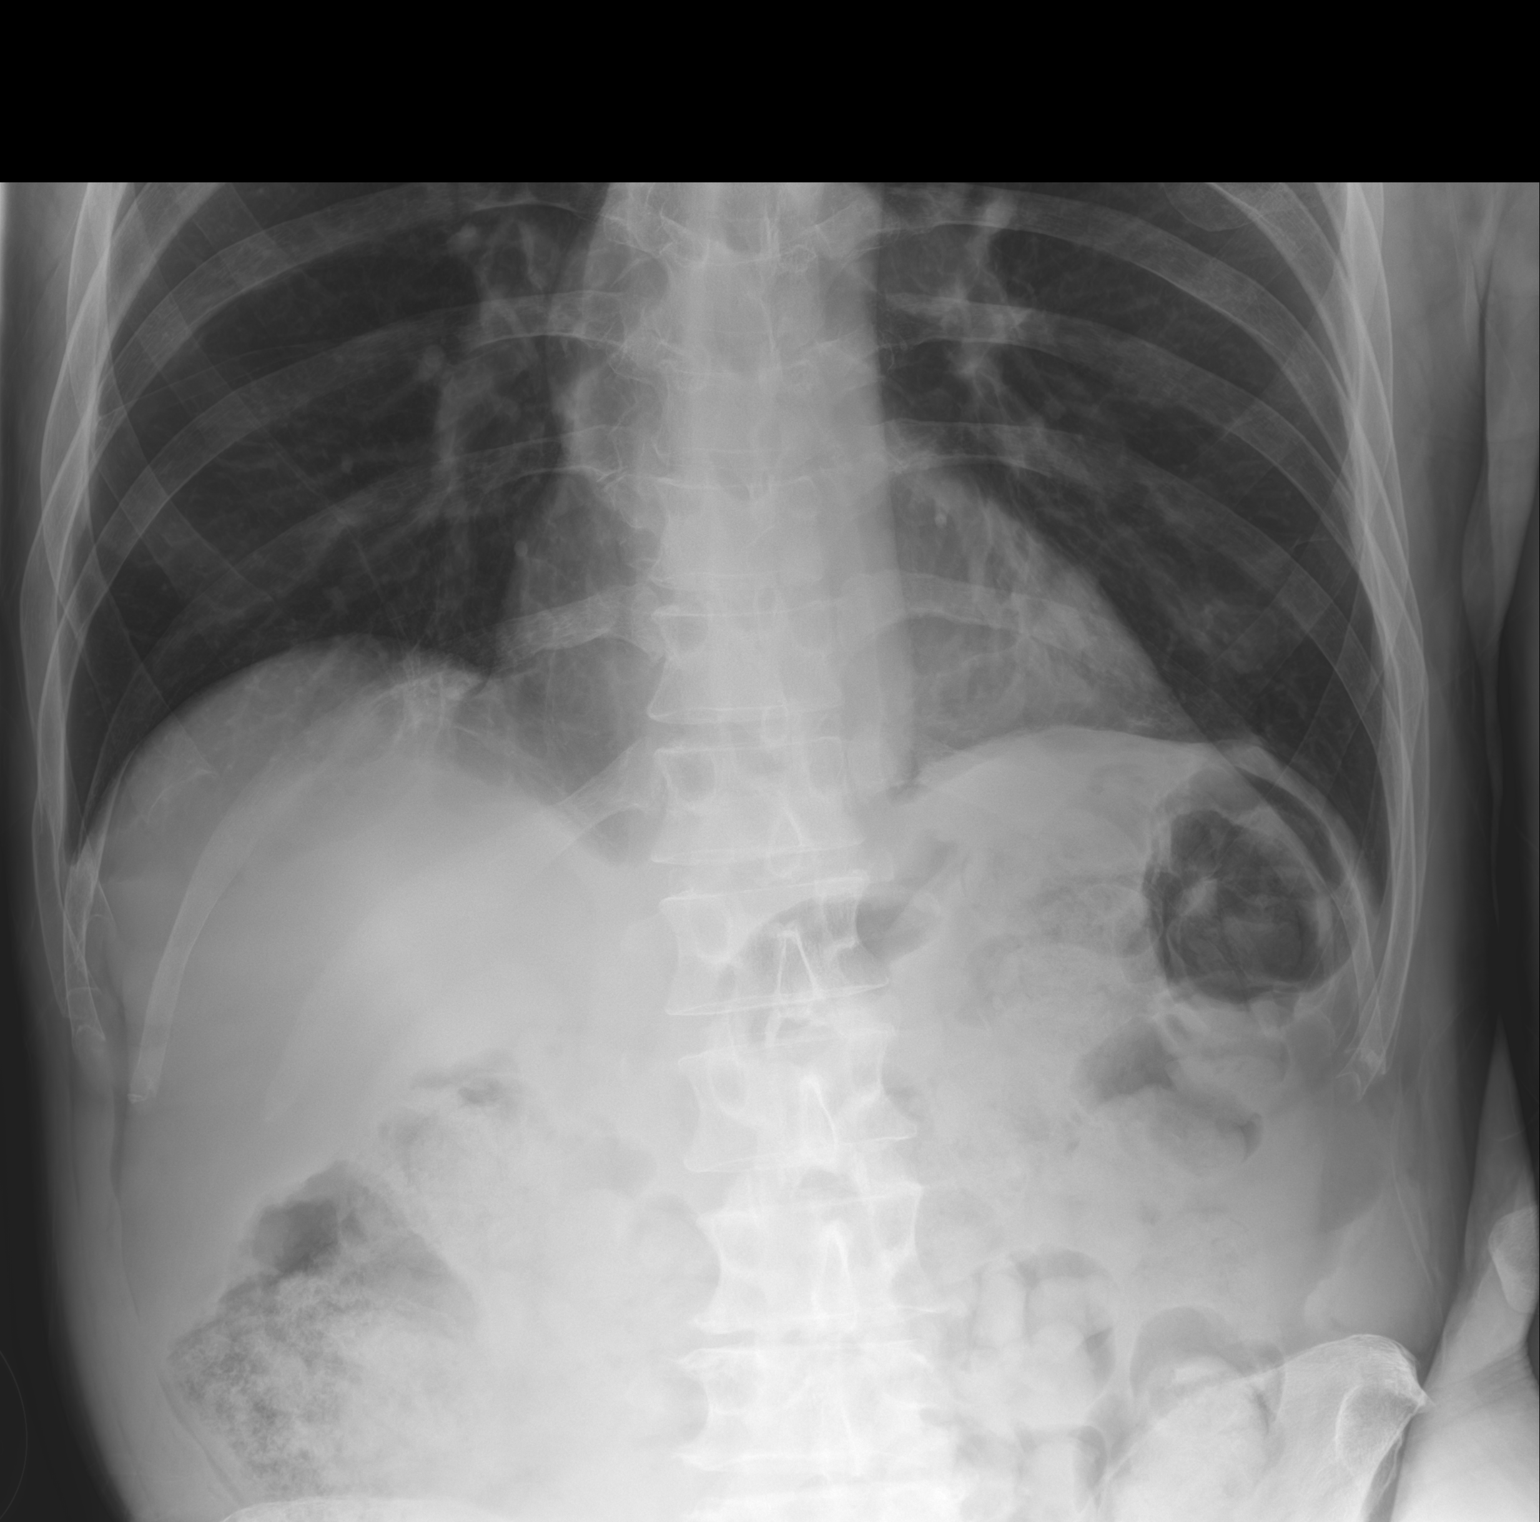

[1 of 1 positions shown; findings below may reference images not displayed]

FINDINGS: The abdomen is incompletely imaged. The bowel gas pattern is normal.
No radio-opaque calculi or other significant radiographic
abnormality are seen.
IMPRESSION: Nonobstructive bowel gas pattern, however the abdomen is
incompletely imaged.

## 2021-10-14 IMAGING — DX DG CHEST 1V PORT
1 series · 1 of 1 positions shown · non-contrast
Comparison: Chest x-ray [DATE].

CLINICAL DATA: Cough and vomiting.

EXAM:
PORTABLE CHEST 1 VIEW

[chest ap]
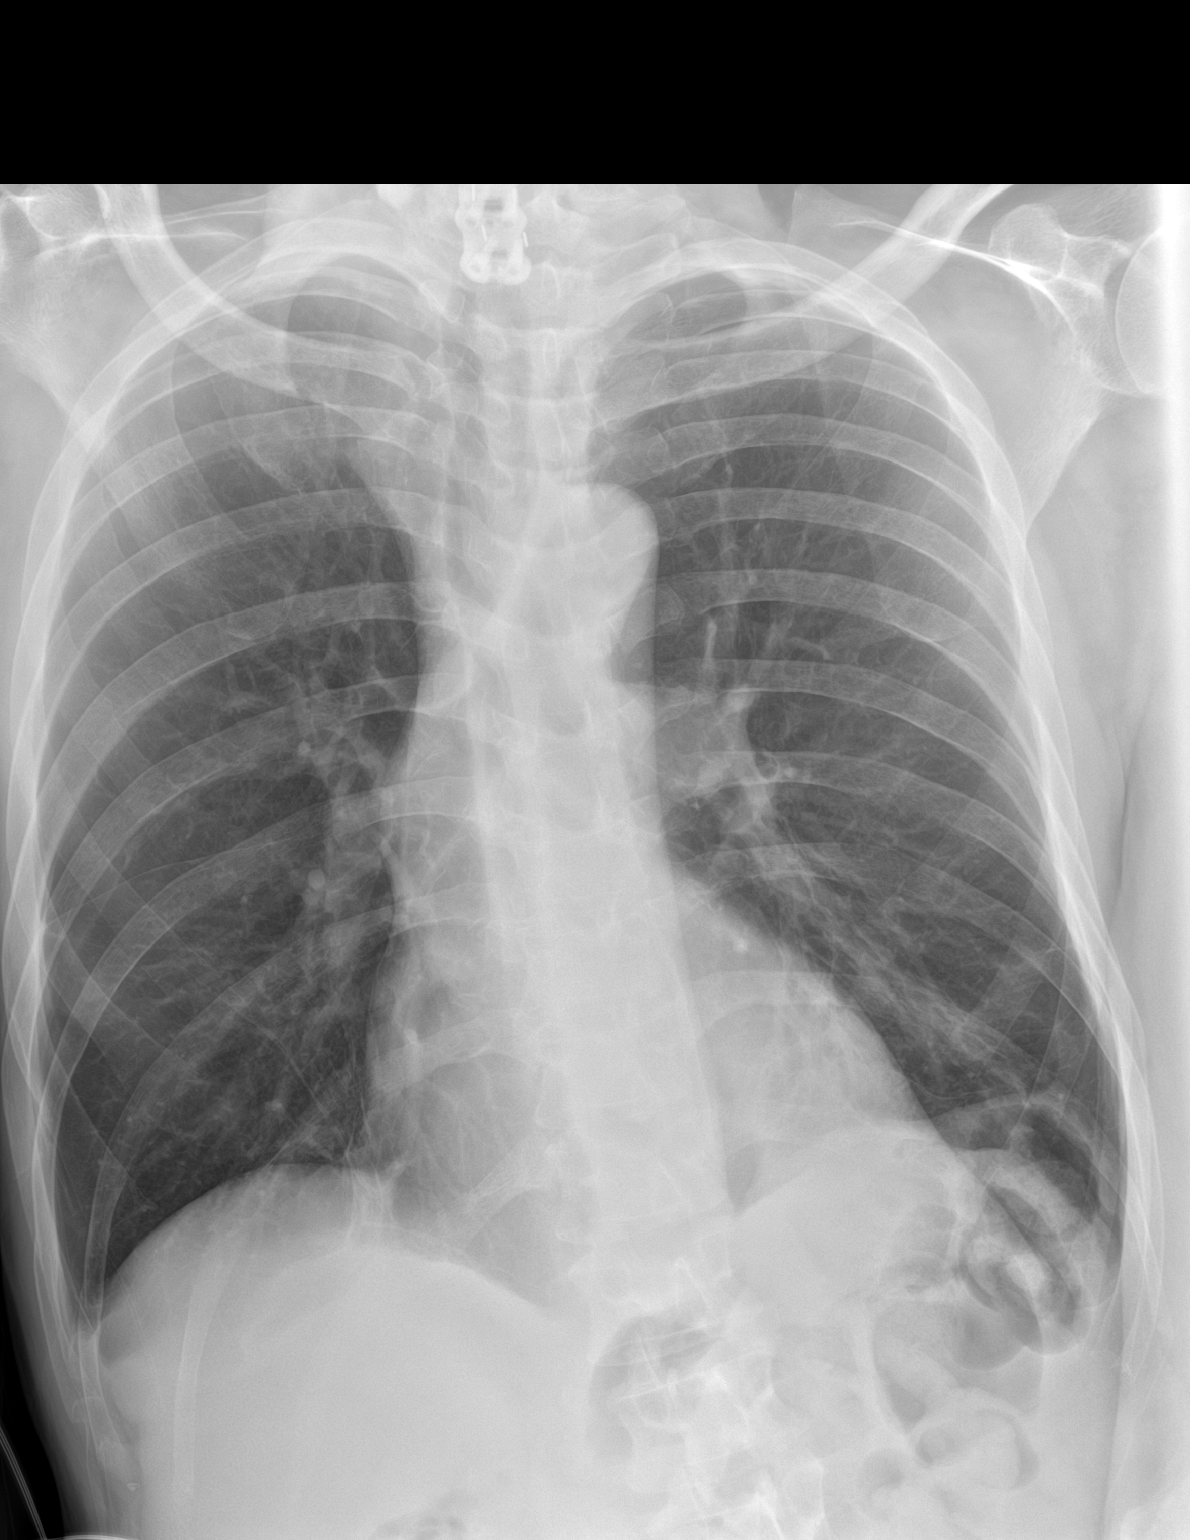

[1 of 1 positions shown; findings below may reference images not displayed]

FINDINGS: There is some minimal patchy opacities in the left lung base. There
is no pleural effusion or pneumothorax. The cardiomediastinal
silhouette is within normal limits. Cervical spinal fusion plate is
present. No acute fractures are seen.
IMPRESSION: 1. Minimal left basilar atelectasis/airspace disease. Correlate
clinically for infection.

## 2021-10-14 MED ORDER — METOCLOPRAMIDE HCL 5 MG/ML IJ SOLN
5.0000 mg | Freq: Four times a day (QID) | INTRAMUSCULAR | Status: DC
Start: 2021-10-14 — End: 2021-10-15
  Administered 2021-10-15: 5 mg via INTRAVENOUS
  Filled 2021-10-14: qty 2

## 2021-10-14 MED ORDER — LACTATED RINGERS IV SOLN: INTRAVENOUS | Status: DC

## 2021-10-14 MED ORDER — PANTOPRAZOLE SODIUM 40 MG IV SOLR
40.0000 mg | Freq: Once | INTRAVENOUS | Status: AC
  Administered 2021-10-14: 40 mg via INTRAVENOUS
  Filled 2021-10-14: qty 10

## 2021-10-14 NOTE — Plan of Care (Signed)

## 2021-10-14 NOTE — Progress Notes (Signed)
MD called RN into the room after patient began vomiting. Patient is clean and resting. No complaints of nausea, pain or any discomfort at all. Room and bed were bleached. Patient in chair with call bell and emesis bag. Awaiting new orders.

## 2021-10-14 NOTE — Progress Notes (Signed)
[facility] PROGRESS NOTE  Patient: Brian [name] VOJ:[id number]   PCP: Pcp, No DOB: [birth year]   DOA: 06/29/2021   DOS: 10/14/2021    Subjective: had 3 episodes of vomiting, no abdominal pain, no fever no nausea. Had BM.   Objective:   Vitals:   10/14/21 0340 10/14/21 0404 10/14/21 1138 10/14/21 1638  BP:  109/86 105/77 120/80  Pulse:  83 87 89  Resp:  17  18  Temp:  98.2 F (36.8 C) 98.5 F (36.9 C) 98.8 F (37.1 C)  TempSrc:  Oral Oral Oral  SpO2:  100% 100% 98%  Weight: 55.4 kg     Height:       General: Appear in mild distress; no visible Abnormal Neck Mass Or lumps, Conjunctiva normal Cardiovascular: S1 and S2 Present, no Murmur, Respiratory: good respiratory effort, Bilateral Air entry present and CTA, no Crackles, no wheezes Abdomen: Bowel Sound present, Non tender  Extremities: no Pedal edema Neurology: alert and oriented to time, place, and person Gait not checked due to patient safety concerns   Assessment and plan: Active Issues Intractable nausea and vomiting. Likely from constipation. Will initiate scheduled Reglan. Continue Senokot. Initiate IV fluids as well. Monitor response.  Brief Hospital course Brian [name] is a 61 year old male with past medical history significant for tobacco and EtOH use disorder who was admitted by neurosurgery for cervical myelopathy due to critical multilevel cervical spinal canal stenosis C3-6 with spinal cord compression and spinal cord signal change after recent fall with progressive upper and lower extremity weakness and tremors. Pt transferred to the floor on 3/2 and PCCM requested transition to [facility] for medical assistance while patient remains under the neurosurgery service.  2/03 ACDF C3-4, C4-5, C5-5, and C6-7 w/ Dr. Jordan Likes 2/07 Code stroke overnight, neg for LVO, CT showing soft tissue swelling. PCCM consulted for concern of airway management and AMS. Went for evacuation of epidural hematoma  2/18 CT  Abd/Pelvis: showing bilateral segmental Pes, started on heparin and showing worsening pneumonia, febrile to  103.  PCT rising, 34.6, restarted on abx 2/19 possible aspiration w/ severe hypoxia; intubated; Switching from heparin to angiomax given subtherapeutic levels despite increase in rate. 2/23 Ketamine infusion added 2/26 extubated 3/02 Modified barium swallow, continues n.p.o. with core track in place,  3/06 s/p IR G-tube placement 3/24 E. coli UTI/bacteremia>> cefazolin x7 days per ID 4/14 repeat urine culture + Citrobacter Freundii that is Sensitive to Bactrim,  4/16 reinserted foley due to recurrent urinary retention 4/28 accidentally pulled out PEG tube the evening.   5/21 had 3 episode of vomiting without blood. Likely from constipation  Awaiting placement/improvement with mobility to be discharged home.  Author: Lynden Oxford, MD [facility] 10/14/2021 7:35 PM   If 7PM-7AM, please contact night-coverage at [email or web address]

## 2021-10-15 LAB — BASIC METABOLIC PANEL
Anion gap: 6 (ref 5–15)
BUN: 14 mg/dL (ref 6–20)
CO2: 27 mmol/L (ref 22–32)
Calcium: 10.1 mg/dL (ref 8.9–10.3)
Chloride: 104 mmol/L (ref 98–111)
Creatinine, Ser: 1.01 mg/dL (ref 0.61–1.24)
GFR, Estimated: >60 mL/min (ref >60)
Glucose, Bld: 125 mg/dL — ABNORMAL HIGH (ref 70–99)
Potassium: 3.9 mmol/L (ref 3.5–5.1)
Sodium: 137 mmol/L (ref 135–145)

## 2021-10-15 LAB — MAGNESIUM: Magnesium: 1.8 mg/dL (ref 1.7–2.4)

## 2021-10-15 LAB — PATHOLOGIST SMEAR REVIEW: Path Review: ABNORMAL

## 2021-10-15 NOTE — Progress Notes (Signed)
   Providing Compassionate, Quality Care - Together   Subjective: Patient reports no issues overnight.  Objective: Vital signs in last 24 hours: Temp:  [97.9 F (36.6 C)-98.8 F (37.1 C)] 98.7 F (37.1 C) (05/22 1250) Pulse Rate:  [82-89] 89 (05/22 1250) Resp:  [14-18] 18 (05/22 1250) BP: (104-120)/(71-83) 105/71 (05/22 1250) SpO2:  [98 %-100 %] 100 % (05/22 1250)  Intake/Output from previous day: 05/21 0701 - 05/22 0700 In: 806.3 [I.V.:806.3] Out: 300 [Urine:200; Emesis/NG output:100] Intake/Output this shift: No intake/output data recorded.  Alert; oriented to person, place, and time MAE, Generalized weakness Decreased fine motor BUE Incision is well-healed    Lab Results: No results for input(s): WBC, HGB, HCT, PLT in the last 72 hours. BMET Recent Labs    10/14/21 1528 10/15/21 0052  NA 140 137  K 3.9 3.9  CL 103 104  CO2 30 27  GLUCOSE 104* 125*  BUN 15 14  CREATININE 0.95 1.01  CALCIUM 10.5* 10.1    Studies/Results: DG CHEST PORT 1 VIEW  Result Date: 10/14/2021 CLINICAL DATA:  Cough and vomiting. EXAM: PORTABLE CHEST 1 VIEW COMPARISON:  Chest x-ray 08/17/2021. FINDINGS: There is some minimal patchy opacities in the left lung base. There is no pleural effusion or pneumothorax. The cardiomediastinal silhouette is within normal limits. Cervical spinal fusion plate is present. No acute fractures are seen. IMPRESSION: 1. Minimal left basilar atelectasis/airspace disease. Correlate clinically for infection. Electronically Signed   By: Darliss Cheney M.D.   On: 10/14/2021 17:35   DG Abd Portable 1V  Result Date: 10/14/2021 CLINICAL DATA:  Cough, vomiting EXAM: PORTABLE ABDOMEN - 1 VIEW COMPARISON:  None Available. FINDINGS: The abdomen is incompletely imaged. The bowel gas pattern is normal. No radio-opaque calculi or other significant radiographic abnormality are seen. IMPRESSION: Nonobstructive bowel gas pattern, however the abdomen is incompletely imaged.  Electronically Signed   By: Romona Curls M.D.   On: 10/14/2021 17:35    Assessment/Plan: Patient is doing well after a very complicated hospital course following four level ACDF. He would like to discharge home. Continue efforts at mobilization.     LOS: 108 days     Val Eagle, DNP, AGNP-C Nurse Practitioner  [facility] [address], [place], [place] [address] P: [phone]    F: [phone]  10/15/2021, 2:13 PM

## 2021-10-15 NOTE — Progress Notes (Signed)
[facility] PROGRESS NOTE  Patient: Brian [name] [id number]   PCP: Pcp, No DOB: 06-24-1960   DOA: 06/29/2021   DOS: 10/15/2021    Subjective: Tolerating solid food since last night.  No vomiting so far.  No nausea no vomiting.  Suspect the patient had bad milk which led to vomiting.  Objective:   Clear to auscultation. Bowel sound present.  Assessment and plan: Active Issues Advancing regular diet. Monitor. Continue mobilization so the patient can be discharged home eventually.  Brief Hospital course Brian [name] is a 61 year old male with past medical history significant for tobacco and EtOH use disorder who was admitted by neurosurgery for cervical myelopathy due to critical multilevel cervical spinal canal stenosis C3-6 with spinal cord compression and spinal cord signal change after recent fall with progressive upper and lower extremity weakness and tremors. Pt transferred to the floor on 3/2 and PCCM requested transition to [facility] for medical assistance while patient remains under the neurosurgery service.  2/03 ACDF C3-4, C4-5, C5-5, and C6-7 w/ Dr. Jordan Likes 2/07 Code stroke overnight, neg for LVO, CT showing soft tissue swelling. PCCM consulted for concern of airway management and AMS. Went for evacuation of epidural hematoma  2/18 CT Abd/Pelvis: showing bilateral segmental Pes, started on heparin and showing worsening pneumonia, febrile to  103.  PCT rising, 34.6, restarted on abx 2/19 possible aspiration w/ severe hypoxia; intubated; Switching from heparin to angiomax given subtherapeutic levels despite increase in rate. 2/23 Ketamine infusion added 2/26 extubated 3/02 Modified barium swallow, continues n.p.o. with core track in place,  3/06 s/p IR G-tube placement 3/24 E. coli UTI/bacteremia>> cefazolin x7 days per ID 4/14 repeat urine culture + Citrobacter Freundii that is Sensitive to Bactrim,  4/16 reinserted foley due to recurrent urinary retention 4/28  accidentally pulled out PEG tube the evening.   5/21 had 3 episode of vomiting without blood. Likely from constipation  Awaiting placement/improvement with mobility to be discharged home.   Principal Problem:   Cervical myelopathy (HCC); severe cervical spinal stenosis with cord compression s/p ACDF C3-7 on 2/3, complicated by epidural hematoma s/p evacuation 2/7 Active Problems:   Acute respiratory failure with hypoxia (HCC)   Acute pulmonary embolism (HCC)   Acute metabolic encephalopathy   Severe sepsis due to recurrent aspiration pneumonia   E. coli bacteremia   Sinus tachycardia   Elevated liver enzymes   Hyponatremia   Tobacco use disorder   Physical deconditioning   Urinary retention   Protein-calorie malnutrition, severe (HCC)   Lumbar back pain   Fall during current hospitalization   Normocytic anemia   Thrombocytosis   Citrobacter infection   Vomiting   Abnormal LFTs   Aspiration pneumonia [facility])    Author: Lynden Oxford, MD [facility] 10/15/2021 7:43 PM   If 7PM-7AM, please contact night-coverage at [email or web address]

## 2021-10-15 NOTE — Progress Notes (Signed)
Physical Therapy Treatment Patient Details Name: Brian [name] MRN: [id number] DOB: Sep 13, 1960 Today's Date: 10/15/2021   History of Present Illness Pt is a 61 y/o male admitted 06/29/21 following C3-7 ACDF after progressive cervical myelopathy symptoms including ataxia, bil UE and LE shaking and weakness, and loss of function in his hands with subsequent fall. Pt initially had improvement in neurological symptoms, but postoperative course was complicated by dysphasia and ataxia secondary to and epidural hematoma with evacuation of hematoma on 2/7. Course complicated by delirium secondary to encephalopathy, sepsis secondary to aspiration PNA, reintubated 2/19-2/26 for airway protection, bilat segmental PEs 2/18. PEG placed 3/6. PMHx includes tobacco and EtOH use disorder.    PT Comments    Patient with signifcant ataxia and clonus of bil LEs during gait. Pt reports he always has incr difficulty walking on Mondays because he does not walk on the weekends (states he does ask nursing to help him). Attempted to work on heel-toe progression however pt could not place his heel down first when walking. Once seated, pt demonstrated ability to perform toe raises with hips/knees at 90. Educated pt in exercises and to continue to work on these exercises every day (especially weekends).     Recommendations for follow up therapy are one component of a multi-disciplinary discharge planning process, led by the attending physician.  Recommendations may be updated based on patient status, additional functional criteria and insurance authorization.  Follow Up Recommendations  Acute inpatient rehab (3hours/day)     Assistance Recommended at Discharge Frequent or constant Supervision/Assistance  Patient can return home with the following A little help with walking and/or transfers;A little help with bathing/dressing/bathroom;Assistance with cooking/housework;Assist for transportation;Help with stairs or ramp for  entrance   Equipment Recommendations  Rolling walker (2 wheels);Wheelchair (measurements PT);Wheelchair cushion (measurements PT)    Recommendations for Other Services       Precautions / Restrictions Precautions Precautions: Fall Precaution Booklet Issued: No Precaution Comments: previous G-tube site (pt pulled it out on evening of 4/29) Required Braces or Orthoses:  (brace is discharged) Cervical Brace:  (now d/c'd) Restrictions Weight Bearing Restrictions: No     Mobility  Bed Mobility               General bed mobility comments: up in recliner    Transfers Overall transfer level: Needs assistance Equipment used: Rolling walker (2 wheels), 2 person hand held assist Transfers: Sit to/from Stand Sit to Stand: Min assist, Mod assist           General transfer comment: vc for hand placement; x 1 from recliner min assist; x1 from seat without armrests with mod assist    Ambulation/Gait Ambulation/Gait assistance: Min assist Gait Distance (Feet): 100 Feet (seated rest; 180) Assistive device: Rolling walker (2 wheels) Gait Pattern/deviations: Step-through pattern, Decreased stride length, Ataxic Gait velocity: decr;     General Gait Details: cues for proximity to RW; assist to control RW from getting too far away from pt; pt with periods of clonus as walking more on his bil forefeet, had difficulty shifting weight back on his heels and could not perform heelstrike (landing either toes first or foot flat). Pt reported he did not walk over the weekend and that's why his gait is so much worse today   Stairs         General stair comments: unsafe to attempt this date due to clonus   Wheelchair Mobility    Modified Rankin (Stroke Patients Only)  Balance Overall balance assessment: Needs assistance Sitting-balance support: Feet supported, No upper extremity supported Sitting balance-Leahy Scale: Fair     Standing balance support: Reliant on  assistive device for balance, Bilateral upper extremity supported Standing balance-Leahy Scale: Poor Standing balance comment: reliant on UE's.                            Cognition Arousal/Alertness: Awake/alert Behavior During Therapy: WFL for tasks assessed/performed Overall Cognitive Status: Within Functional Limits for tasks assessed                                 General Comments: pt with gait more impaired this date and pt able to state he did not feel safe attempting stairs this date        Exercises General Exercises - Lower Extremity Ankle Circles/Pumps: AAROM, 5 reps Long Arc Quad: Strengthening, 10 reps Toe Raises: AROM, Both, 10 reps, Seated Other Exercises Other Exercises: legs elevated in recliner--coordination exercises with variations of heel to shin; mild difficulty LLE, signficant difficulty with RLe    General Comments        Pertinent Vitals/Pain Pain Assessment Pain Assessment: Faces Faces Pain Scale: Hurts little more Pain Location: lower rt leg; describes paresthesias Pain Descriptors / Indicators: Pins and needles Pain Intervention(s): Limited activity within patient's tolerance, Monitored during session    Home Living                          Prior Function            PT Goals (current goals can now be found in the care plan section) Acute Rehab PT Goals Patient Stated Goal: to go home and get back to work PT Goal Formulation: With patient Time For Goal Achievement: 10/29/21 Potential to Achieve Goals: Good Progress towards PT goals: Not progressing toward goals - comment (per pt, gait much worse today)    Frequency    Min 5X/week      PT Plan Current plan remains appropriate    Co-evaluation              AM-PAC PT "6 Clicks" Mobility   Outcome Measure  Help needed turning from your back to your side while in a flat bed without using bedrails?: A Little Help needed moving from lying on  your back to sitting on the side of a flat bed without using bedrails?: A Little Help needed moving to and from a bed to a chair (including a wheelchair)?: A Lot Help needed standing up from a chair using your arms (e.g., wheelchair or bedside chair)?: A Lot Help needed to walk in hospital room?: A Lot (mod cues today) Help needed climbing 3-5 steps with a railing? : A Lot (mod cues today) 6 Click Score: 14    End of Session Equipment Utilized During Treatment: Gait belt Activity Tolerance: Patient tolerated treatment well Patient left: in chair;with call bell/phone within reach;with chair alarm set   PT Visit Diagnosis: Other abnormalities of gait and mobility (R26.89);Other symptoms and signs involving the nervous system (R29.898)     Time: [id number] PT Time Calculation (min) (ACUTE ONLY): 25 min  Charges:  $Gait Training: 23-37 mins                      Connell  Pager [phone] Office [phone]    Rexanne Mano 10/15/2021, 4:00 PM

## 2021-10-15 NOTE — Plan of Care (Signed)

## 2021-10-16 NOTE — TOC Progression Note (Signed)
Transition of Care [facility]) - Progression Note    Patient Details  Name: Brian [name] MRN: [id number] Date of Birth: 10-20-1960  Transition of Care [facility]) CM/SW [place], [place] Phone Number: 10/16/2021, 11:55 AM  Clinical Narrative:   CSW alerted by [facility] Leadership that [facility] is likely agreeable to take the patient for SNF, but they will require covid vaccination first. CSW checked in with MD to ensure no medical issues prohibiting vaccination, and then CSW met with patient to explain. CSW answered patient's questions, and patient seemed overwhelmed with information. Patient asked CSW to give him time to think about it and check back in with him. CSW to follow.      Barriers to Discharge: SNF Pending bed offer, SNF Pending payor source - LOG, SNF Pending Medicaid, Inadequate or no insurance  Expected Discharge Plan and Services   In-house Referral: Clinical Social Work                                             Social Determinants of Health (SDOH) Interventions    Readmission Risk Interventions     View : No data to display.

## 2021-10-16 NOTE — Progress Notes (Signed)
Occupational Therapy Treatment Patient Details Name: Brian [name] MRN: [id number] DOB: [birth year] Today's Date: 10/16/2021   History of present illness Pt is a 61 y/o male admitted 06/29/21 following C3-7 ACDF after progressive cervical myelopathy symptoms including ataxia, bil UE and LE shaking and weakness, and loss of function in his hands with subsequent fall. Pt initially had improvement in neurological symptoms, but postoperative course was complicated by dysphasia and ataxia secondary to and epidural hematoma with evacuation of hematoma on 2/7. Course complicated by delirium secondary to encephalopathy, sepsis secondary to aspiration PNA, reintubated 2/19-2/26 for airway protection, bilat segmental PEs 2/18. PEG placed 3/6. PMHx includes tobacco and EtOH use disorder.   OT comments  Patient received in recliner and eager to participate. Patient required mod assist to stand from recliner with difficulty straightening knees and ambulated to sink. Patient began oral hygiene standing but asked to sit to complete. Patient performed UB bathing seated in chair and stood for peri area cleaning with mod assist. Patient was able to donn pullover top with min assist and mod assist for scrub bottoms. Patient asked to walk in hallway and returned to recliner. Patient would benefit from further OT services in acute setting.    Recommendations for follow up therapy are one component of a multi-disciplinary discharge planning process, led by the attending physician.  Recommendations may be updated based on patient status, additional functional criteria and insurance authorization.    Follow Up Recommendations  Skilled nursing-short term rehab (<3 hours/day)    Assistance Recommended at Discharge Set up Supervision/Assistance  Patient can return home with the following  A little help with walking and/or transfers;A little help with bathing/dressing/bathroom;Assistance with cooking/housework;Assistance with  feeding;Assist for transportation;Help with stairs or ramp for entrance   Equipment Recommendations  Wheelchair (measurements OT);Wheelchair cushion (measurements OT);Hospital bed;BSC/3in1;Tub/shower seat    Recommendations for Other Services      Precautions / Restrictions Precautions Precautions: Fall Precaution Booklet Issued: No Restrictions Weight Bearing Restrictions: No       Mobility Bed Mobility Overal bed mobility: Needs Assistance             General bed mobility comments: up in recliner    Transfers Overall transfer level: Needs assistance Equipment used: Rolling walker (2 wheels) Transfers: Sit to/from Stand, Bed to chair/wheelchair/BSC Sit to Stand: Min assist, Mod assist           General transfer comment: patient was min to mod assist to stand from recliner with difficulty powering up and straightening knees     Balance Overall balance assessment: Needs assistance Sitting-balance support: Feet supported, No upper extremity supported Sitting balance-Leahy Scale: Fair     Standing balance support: Reliant on assistive device for balance, Bilateral upper extremity supported, Single extremity supported Standing balance-Leahy Scale: Poor Standing balance comment: stood at sink for peri area bathing with one extremity support.                           ADL either performed or assessed with clinical judgement   ADL Overall ADL's : Needs assistance/impaired     Grooming: Wash/dry hands;Wash/dry face;Oral care;Applying deodorant;Set up;Sitting Grooming Details (indicate cue type and reason): assist with toothpaste - squeezing out tube Upper Body Bathing: Moderate assistance Upper Body Bathing Details (indicate cue type and reason): assist for back. Pt able to complete front Lower Body Bathing: Moderate assistance Lower Body Bathing Details (indicate cue type and reason): assistance with back and patient  able to bathe front peri area while  standing Upper Body Dressing : Minimal assistance;Sitting Upper Body Dressing Details (indicate cue type and reason): donned pullover top Lower Body Dressing: Moderate assistance;Sit to/from stand Lower Body Dressing Details (indicate cue type and reason): assistance threading RLE into scrub pants.               General ADL Comments: increased difficulty with sit to stands    Extremity/Trunk Assessment Upper Extremity Assessment RUE Deficits / Details: digits flexion 4/5, digit extension unable to achieve full ROM, mild discoordination RUE Sensation: decreased light touch;decreased proprioception RUE Coordination: decreased fine motor;decreased gross motor LUE Deficits / Details: digits flexion 4/5, digit extension unable to achieve full ROM, mild discoordination LUE Sensation: decreased light touch LUE Coordination: decreased fine motor;decreased gross motor            Vision       Perception     Praxis      Cognition Arousal/Alertness: Awake/alert Behavior During Therapy: WFL for tasks assessed/performed Overall Cognitive Status: Within Functional Limits for tasks assessed                                 General Comments: more assistance needed with sit to stands        Exercises      Shoulder Instructions       General Comments      Pertinent Vitals/ Pain       Pain Assessment Pain Assessment: Faces Faces Pain Scale: Hurts little more Pain Location: RLE and right wrist Pain Descriptors / Indicators: Spasm, Pins and needles, Discomfort Pain Intervention(s): Limited activity within patient's tolerance, Monitored during session, Repositioned  Home Living                                          Prior Functioning/Environment              Frequency  Min 2X/week        Progress Toward Goals  OT Goals(current goals can now be found in the care plan section)  Progress towards OT goals: Progressing toward  goals  Acute Rehab OT Goals Patient Stated Goal: go home OT Goal Formulation: With patient Time For Goal Achievement: 10/22/21 Potential to Achieve Goals: Good ADL Goals Pt Will Perform Eating: with min assist;with assist to don/doff brace/orthosis;with adaptive utensils;sitting Pt Will Perform Grooming: with supervision;sitting Pt Will Perform Upper Body Bathing: with min assist;with adaptive equipment;sitting Pt Will Transfer to Toilet: with min assist;stand pivot transfer;bedside commode Pt/caregiver will Perform Home Exercise Program: Increased ROM;Increased strength;Both right and left upper extremity;With theraputty;With minimal assist;With written HEP provided Additional ADL Goal #1: Pt will grasp objects with R hand and bring to mouth 10/10 trials in prep for self feeding Additional ADL Goal #2: Pt will grasp/release 5/5 objects with L hand to increase indep with ADLs  Plan Discharge plan remains appropriate;Frequency remains appropriate    Co-evaluation                 AM-PAC OT "6 Clicks" Daily Activity     Outcome Measure   Help from another person eating meals?: A Little Help from another person taking care of personal grooming?: A Little Help from another person toileting, which includes using toliet, bedpan, or urinal?: A Lot Help from another  person bathing (including washing, rinsing, drying)?: A Lot Help from another person to put on and taking off regular upper body clothing?: A Little Help from another person to put on and taking off regular lower body clothing?: A Lot 6 Click Score: 15    End of Session Equipment Utilized During Treatment: Gait belt;Rolling walker (2 wheels)  OT Visit Diagnosis: Other abnormalities of gait and mobility (R26.89);Muscle weakness (generalized) (M62.81);Feeding difficulties (R63.3);Other symptoms and signs involving the nervous system (R29.898);Other symptoms and signs involving cognitive function Pain - Right/Left:  Right Pain - part of body: Leg   Activity Tolerance Patient tolerated treatment well   Patient Left in chair;with call bell/phone within reach;with chair alarm set   Nurse Communication Mobility status        Time: [id number] OT Time Calculation (min): 32 min  Charges: OT General Charges $OT Visit: 1 Visit OT Treatments $Self Care/Home Management : 23-37 mins  Lodema Hong, [place]  Pager [phone] Office [place] 10/16/2021, 2:24 PM

## 2021-10-16 NOTE — Progress Notes (Signed)
   Providing Compassionate, Quality Care - Together   Subjective: Patient has a lot of questions about discharging to a skilled nursing facility. He would like to know how long he will likely have to stay at the SNF, how often he will get to work with therapies, is the goal to still get him home? He also has concerns about his financial situation and when he might hear back regarding disability. He is still feeling very overwhelmed.  Objective: Vital signs in last 24 hours: Temp:  [97.6 F (36.4 C)-98.5 F (36.9 C)] 97.9 F (36.6 C) (05/23 1518) Pulse Rate:  [86-100] 86 (05/23 1518) Resp:  [16-18] 16 (05/23 1518) BP: (100-128)/(69-83) 112/83 (05/23 1518) SpO2:  [100 %] 100 % (05/23 1518)  Intake/Output from previous day: 05/22 0701 - 05/23 0700 In: -  Out: 400 [Urine:400] Intake/Output this shift: Total I/O In: -  Out: 400 [Urine:400]    Alert; oriented to person, place, and time MAE, Generalized weakness Decreased fine motor BUE Incision is well-healed  Lab Results: No results for input(s): WBC, HGB, HCT, PLT in the last 72 hours. BMET Recent Labs    10/14/21 1528 10/15/21 0052  NA 140 137  K 3.9 3.9  CL 103 104  CO2 30 27  GLUCOSE 104* 125*  BUN 15 14  CREATININE 0.95 1.01  CALCIUM 10.5* 10.1    Studies/Results: No results found.  Assessment/Plan: Patient is doing well after a very complicated hospital course following four level ACDF. He would like to discharge home. Continue efforts at mobilization.   LOS: 109 days   -[facility] is a possibility for Mr. Reth at discharge. -I encouraged Mr. Burdette to discuss this with his son when he comes by this evening. -I will reach out to CSW to see if any information has been received regarding the patient's disability status.   Val Eagle, DNP, AGNP-C Nurse Practitioner  [facility] [address], [place], [place] [address] P: [phone]    F:  [phone]  10/16/2021, 5:41 PM

## 2021-10-16 NOTE — Progress Notes (Signed)
Nutrition Follow-up  DOCUMENTATION CODES:  Severe malnutrition in context of social or environmental circumstances, Underweight  INTERVENTION:  Continue current diet as ordered, encourage PO intake Boost Plus TID to provide 360kcal and 14g of protein per carton Continue vitamin regimen  NUTRITION DIAGNOSIS:  Severe Malnutrition related to social / environmental circumstances as evidenced by severe muscle depletion, severe fat depletion.  - ongoing   GOAL:  Patient will meet greater than or equal to 90% of their needs  - progressing, increasing oral intake  MONITOR:  PO intake, Supplement acceptance, Skin, Weight trends, I & O's  REASON FOR ASSESSMENT:  Consult Assessment of nutrition requirement/status, Enteral/tube feeding initiation and management  ASSESSMENT:  Pt with hx EtOH abuse, tobacco use, and spinal stenosis initially presented 2/3 for planned multilevel anterior cervical decompression and fusion surgery after experiencing progressive bilateral upper and lower extremity weakness and spasticity due to critical multilevel cervical spinal stenosis with spinal cord compression and signal change.  2/3 - s/p anterior cervical discectomy with interbody fusion  2/5 - pt initially complained of worsening swallowing function 2/6 - MBS, NPO per SLP 2/7 - transferred to ICU, s/p re-exploration anterior cervical fusion with evacuation of epidural hematoma 2/8 - Cortrak tube placed (tip gastric), tube feeds initiated 2/10 - MBS, diet advanced to dysphagia 2 with nectar-thick liquids, Cortrak removed 2/19- Intubated after desaturation, possibly from PO intake 2/20 - s/p cortrak tube; post pyloric  2/26 - Extubated 3/02 - failed MBS 3/6 - PEG placed 3/23 - diet advanced to dysphagia 2 with nectar thick liquids s/p MBS 3/24 - PEG replaced 4/28 - Pt removed PEG  Pt resting in bedside chair at the time of assessment napping. Pt continues to have good intake of meals and boost plus.  Continues to have good participation with therapy.   Weight stable.   Per case management, pt may be able to dc to [facility] if pt agreeable to COVID19 vaccine.  Nutritionally Relevant Medications: Scheduled Meds:  baclofen  20 mg Oral TID   folic acid  1 mg Oral Daily   lactose free nutrition  237 mL Oral TID WC   multivitamin with minerals  1 tablet Oral Daily   senna-docusate  1 tablet Oral BID   thiamine  100 mg Oral Daily   PRN Meds: bisacodyl, diphenhydrAMINE, ondansetron, polyethylene glycol, sodium phosphate  Labs Reviewed  Diet Order:   Diet Order             Diet regular Room service appropriate? Yes with Assist; Fluid consistency: Thin  Diet effective now                  EDUCATION NEEDS:  Education needs have been addressed  Skin:  Skin Assessment: Reviewed RN Assessment  Last BM:  5/22  Height:  Ht Readings from Last 1 Encounters:  07/15/21 5\' 10"  (1.778 m)   Weight:  Wt Readings from Last 1 Encounters:  10/14/21 55.4 kg   Ideal Body Weight:  78.2 kg  BMI:  Body mass index is 17.52 kg/m.  Estimated Nutritional Needs:  Kcal:  2100-2300 Protein:  105-125 grams Fluid:  >2L/day   10/16/21, RD, LDN Clinical Dietitian RD pager # available in AMION  After hours/weekend pager # available in [facility]

## 2021-10-16 NOTE — Progress Notes (Signed)
Physical Therapy Treatment Patient Details Name: Brian [name] MRN: [id number] DOB: [birth year] Today's Date: 10/16/2021   History of Present Illness Pt is a 61 y/o male admitted 06/29/21 following C3-7 ACDF after progressive cervical myelopathy symptoms including ataxia, bil UE and LE shaking and weakness, and loss of function in his hands with subsequent fall. Pt initially had improvement in neurological symptoms, but postoperative course was complicated by dysphasia and ataxia secondary to and epidural hematoma with evacuation of hematoma on 2/7. Course complicated by delirium secondary to encephalopathy, sepsis secondary to aspiration PNA, reintubated 2/19-2/26 for airway protection, bilat segmental PEs 2/18. PEG placed 3/6. PMHx includes tobacco and EtOH use disorder.    PT Comments    Pt continues to be eager to participate in therapy. Pt with improved ambulation tolerance and kinematics today. Focused on standing balance and sit to stand transfers without use of RW and equal weightbearing on Bilat LEs as pt with significant L lateral weigh shift. Acute PT to continue to follow.   Recommendations for follow up therapy are one component of a multi-disciplinary discharge planning process, led by the attending physician.  Recommendations may be updated based on patient status, additional functional criteria and insurance authorization.  Follow Up Recommendations  Acute inpatient rehab (3hours/day)     Assistance Recommended at Discharge Frequent or constant Supervision/Assistance  Patient can return home with the following A little help with walking and/or transfers;A little help with bathing/dressing/bathroom;Assistance with cooking/housework;Assist for transportation;Help with stairs or ramp for entrance   Equipment Recommendations  Rolling walker (2 wheels);Wheelchair (measurements PT);Wheelchair cushion (measurements PT)    Recommendations for Other Services Rehab consult      Precautions / Restrictions Precautions Precautions: Fall Precaution Booklet Issued: No Precaution Comments: previous G-tube site (pt pulled it out on evening of 4/29) Required Braces or Orthoses:  (brace is discharged) Restrictions Weight Bearing Restrictions: No     Mobility  Bed Mobility               General bed mobility comments: up in recliner    Transfers Overall transfer level: Needs assistance Equipment used: Rolling walker (2 wheels) Transfers: Sit to/from Stand, Bed to chair/wheelchair/BSC Sit to Stand: Min guard   Step pivot transfers:  (via HHA from bed>recliner chair)       General transfer comment: pt with close min guard, worked on sit to stand without using RW x 5 reps    Ambulation/Gait Ambulation/Gait assistance: Herbalist (Feet): 150 Feet Assistive device: Rolling walker (2 wheels) Gait Pattern/deviations: Step-through pattern, Decreased stride length, Ataxic Gait velocity: decr;     General Gait Details: cues for proximity to RW; assist to control RW from getting too far away from pt; pt with periods of clonus as walking more on his bil forefeet, had difficulty shifting weight back on his heels and could not perform heelstrike (landing either toes first or foot flat). Pt reported he did not walk over the weekend and that's why his gait is so much worse today   Stairs             Wheelchair Mobility    Modified Rankin (Stroke Patients Only) Modified Rankin (Stroke Patients Only) Pre-Morbid Rankin Score: No symptoms Modified Rankin: Moderately severe disability     Balance Overall balance assessment: Needs assistance Sitting-balance support: Feet supported, No upper extremity supported Sitting balance-Leahy Scale: Fair Sitting balance - Comments: anterior lean when unsupported Postural control: Posterior lean Standing balance support: Reliant on assistive device  for balance, Bilateral upper extremity supported,  Single extremity supported Standing balance-Leahy Scale: Poor Standing balance comment: worked on standing tolerance and evening weight on bilat LEs, pt with strong tendency to lean to the L, tactile cues to achieve midline                            Cognition Arousal/Alertness: Awake/alert Behavior During Therapy: WFL for tasks assessed/performed Overall Cognitive Status: Within Functional Limits for tasks assessed                                          Exercises      General Comments General comments (skin integrity, edema, etc.): VSS      Pertinent Vitals/Pain Pain Assessment Pain Assessment: 0-10 Pain Score: 2     Home Living                          Prior Function            PT Goals (current goals can now be found in the care plan section) Acute Rehab PT Goals PT Goal Formulation: With patient Time For Goal Achievement: 10/29/21 Potential to Achieve Goals: Good Progress towards PT goals: Progressing toward goals    Frequency    Min 5X/week      PT Plan Current plan remains appropriate    Co-evaluation              AM-PAC PT "6 Clicks" Mobility   Outcome Measure  Help needed turning from your back to your side while in a flat bed without using bedrails?: A Little Help needed moving from lying on your back to sitting on the side of a flat bed without using bedrails?: A Little Help needed moving to and from a bed to a chair (including a wheelchair)?: A Lot Help needed standing up from a chair using your arms (e.g., wheelchair or bedside chair)?: A Lot Help needed to walk in hospital room?: A Lot (mod cues today) Help needed climbing 3-5 steps with a railing? : A Lot (mod cues today) 6 Click Score: 14    End of Session Equipment Utilized During Treatment: Gait belt Activity Tolerance: Patient tolerated treatment well Patient left: in chair;with call bell/phone within reach;with chair alarm set Nurse  Communication: Mobility status;Other (comment) PT Visit Diagnosis: Other abnormalities of gait and mobility (R26.89);Other symptoms and signs involving the nervous system (R29.898)     Time: [id number] PT Time Calculation (min) (ACUTE ONLY): 21 min  Charges:  $Gait Training: 8-22 mins                     Kittie Plater, PT, DPT Acute Rehabilitation Services Secure chat preferred Office #: [phone]    Berline Lopes 10/16/2021, 3:51 PM

## 2021-10-16 NOTE — Progress Notes (Signed)
[facility] PROGRESS NOTE  Patient: Brian [name] [id number]   PCP: Pcp, No DOB: 1960-11-27   DOA: 06/29/2021   DOS: 10/16/2021   Primary team, neurosurgery  Subjective: Denies any acute complaint.  No nausea no vomiting or no fever no chills.  Objective:   S1-S2 present. Bowel sound present. No edema.  Assessment and plan: Active Issues Remains medically stable. Awaiting improvement in strength so that he can go home.  Social worker working on paperwork.  Brief Hospital course Brian [name] is a 61 year old male with past medical history significant for tobacco and EtOH use disorder who was admitted by neurosurgery for cervical myelopathy due to critical multilevel cervical spinal canal stenosis C3-6 with spinal cord compression and spinal cord signal change after recent fall with progressive upper and lower extremity weakness and tremors. Pt transferred to the floor on 3/2 and PCCM requested transition to [facility] for medical assistance while patient remains under the neurosurgery service.  2/03 ACDF C3-4, C4-5, C5-5, and C6-7 w/ Dr. Annette Stable 2/07 Code stroke overnight, neg for LVO, CT showing soft tissue swelling. PCCM consulted for concern of airway management and AMS. Went for evacuation of epidural hematoma  2/18 CT Abd/Pelvis: showing bilateral segmental Pes, started on heparin and showing worsening pneumonia, febrile to  103.  PCT rising, 34.6, restarted on abx 2/19 possible aspiration w/ severe hypoxia; intubated; Switching from heparin to angiomax given subtherapeutic levels despite increase in rate. 2/23 Ketamine infusion added 2/26 extubated 3/02 Modified barium swallow, continues n.p.o. with core track in place,  3/06 s/p IR G-tube placement 3/24 E. coli UTI/bacteremia>> cefazolin x7 days per ID 4/14 repeat urine culture + Citrobacter Freundii that is Sensitive to Bactrim,  4/16 reinserted foley due to recurrent urinary retention 4/28 accidentally pulled out PEG  tube the evening.   5/21 had 3 episode of vomiting without blood. Likely from constipation  Awaiting placement/improvement with mobility to be discharged home.   Principal Problem:   Cervical myelopathy ([place]); severe cervical spinal stenosis with cord compression s/p ACDF Q000111Q on 2/3, complicated by epidural hematoma s/p evacuation 2/7 Active Problems:   Acute respiratory failure with hypoxia (HCC)   Acute pulmonary embolism (HCC)   Acute metabolic encephalopathy   Severe sepsis due to recurrent aspiration pneumonia   E. coli bacteremia   Sinus tachycardia   Elevated liver enzymes   Hyponatremia   Tobacco use disorder   Physical deconditioning   Urinary retention   Protein-calorie malnutrition, severe (HCC)   Lumbar back pain   Fall during current hospitalization   Normocytic anemia   Thrombocytosis   Citrobacter infection   Vomiting   Abnormal LFTs   Aspiration pneumonia [facility])    Author: Berle Mull, MD [facility] 10/16/2021 8:14 PM   If 7PM-7AM, please contact night-coverage at [email or web address]

## 2021-10-17 NOTE — Progress Notes (Signed)
PROGRESS NOTE    Brian [name]  [id number] DOB: 08/25/1960 DOA: 06/29/2021 PCP: Merryl Hacker, No  Primary service: Neurosurgery  Brief Narrative: Brian [name] is a 61 year old male with past medical history significant for tobacco and EtOH use disorder who was admitted by neurosurgery for cervical myelopathy due to critical multilevel cervical spinal canal stenosis C3-6 with spinal cord compression and spinal cord signal change after recent fall with progressive upper and lower extremity weakness and tremors. Pt transferred to the floor on 3/2 and PCCM requested transition to [facility] for medical assistance while patient remains under the neurosurgery service.  2/03 ACDF C3-4, C4-5, C5-5, and C6-7 w/ Brian [name] 2/07 Code stroke overnight, neg for LVO, CT showing soft tissue swelling. PCCM consulted for concern of airway management and AMS. Went for evacuation of epidural hematoma  2/18 CT Abd/Pelvis: showing bilateral segmental Pes, started on heparin and showing worsening pneumonia, febrile to  103.  PCT rising, 34.6, restarted on abx 2/19 possible aspiration w/ severe hypoxia; intubated; Switching from heparin to angiomax given subtherapeutic levels despite increase in rate. 2/23 Ketamine infusion added 2/26 extubated 3/02 Modified barium swallow, continues n.p.o. with core track in place,  3/06 s/p IR G-tube placement 3/24 E. coli UTI/bacteremia>> cefazolin x7 days per ID 4/14 repeat urine culture + Citrobacter Freundii that is Sensitive to Bactrim,  4/16 reinserted foley due to recurrent urinary retention 4/28 accidentally pulled out PEG tube the evening.   5/21 had 3 episode of vomiting without blood. Likely from constipation  Awaiting placement/improvement with mobility to be discharged home.   Assessment and Plan: Cervical myelopathy ([place]); severe cervical spinal stenosis with cord compression s/p ACDF Q000111Q on 2/3, complicated by epidural hematoma s/p evacuation 2/7 Awaiting SNF  placement per TOC vs continued improvement in strength and possible discharge home.  Acute respiratory failure with hypoxia (HCC) Likely Multifactorial with significant dysphagia and recurrent aspiration pneumonia events during initial hospitalization following ACDF surgery with postoperative complications of postoperative cervical hematoma. Patient did require ventilatory support in the intensive care unit and was successfully extubated on 07/22/2021.  Patient completed 5 days of Vancomycin/Cefepime > Vancomycin Zosyn for aspiration pneumonia. Now on room air.   Acute pulmonary embolism (HCC) CTA chest (2/7) showed bilateral segmental PE. LE [place] negative for DVT.  TTE with normal LV systolic function.  Supplemental oxygen weaned off. -Continue Eliquis 5 mg twice daily  Acute metabolic encephalopathy CT head, MRI brain, EEG, TSH, B12, ammonia unrevealing.  Etiology likely multifactorial with acute respiratory failure secondary to aspiration pneumonia, bilateral pulmonary embolism.  Resolved.  E. coli bacteremia Urine culture and blood cultures x2 08/17/2021 positive for E. coli. Completed 7-day course of Ceftriaxone > Cefazolin.  Severe sepsis due to recurrent aspiration pneumonia Treated with Vancomycin/Cefepime and transitioned to Vancomycin/Zosyn. Completed a 5 day treatment course.  -Continue aspiration precautions  Sinus tachycardia, resolved Multifactorial including sepsis, dehydration, PE, agitation. Patient started on metoprolol. Tachycardia now resolved. Metoprolol weaned down to 12.5 mg BID dosing. Metoprolol discontinued.  Elevated liver enzymes Etiology likely secondary to sepsis from aspiration pneumonia as above.  Acute hepatitis panel negative.  RUQ ultrasound unremarkable. Resolved.  Hyponatremia Etiology likely secondary to dehydration. Mild. Improved with good nutritional intake  Tobacco use disorder Counseled on need for complete cessation. Managed on nicotine patch which  is now discontinued.  Physical deconditioning Significant weakness in all extremities as a result of cervical myelopathy and acute illness.  Slowly improving while inpatient with physical therapy. Recommendation for inpatient rehabilitation  on discharge. -Continue PT/OT  Urinary retention Foley catheter discontinued on 08/09/2021; replaced on 09/09/2021 for recurrent urinary retention and removed again on 4/25. Resolved. -Continue tamsulosin and finasteride  Protein-calorie malnutrition, severe ([place]) Dietitian recommendations (5/9): Continue current diet as ordered, encourage PO intake Boost Plus TID to provide 360kcal and 14g of protein per carton Continue vitamin regimen  Vomiting In setting of tube feeds. Now resolved.  Citrobacter infection Noted on urine culture (4/14). Patient treated empirically with Ceftriaxone IV. Urine culture showed Ceftriaxone resistant strain. Ceftriaxone transitioned to Bactrim to complete a 7 day course of antibiotic therapy.  Thrombocytosis Likely Reactive. Now resolved.  Normocytic anemia Chronic and [name]. Patient did require 1 unit of pRBC for acute anemia while in ICU, but unsure of etiology.  Fall during current hospitalization No injury. Patient with recurrent falls. -Continue fall precautions.    Lumbar back pain -Per primary  Hyperglycemia-resolved as of 07/12/2021 Likely due to Steroids. Hemoglobin A1C of 5.2%. Resolved.   DVT prophylaxis: Eliquis Code Status:   Code Status: Full Code Family Communication: None at bedside Disposition Plan: SNF when bed is available vs home. Medically [name] for discharge. Per primary team.   Consultants:  PCCM Infectious disease Neurology Palliative care General medicine  Procedures:  ACDF C3-7, neurosurgery Dr. Trenton Gammon 2/3 Hematoma evacuation 2/7 Vascular duplex ultrasound bilateral lower extremities 2/19 TTE 2/20 TTE 3/1 IR G-tube placement 3/6, 3/24  Antimicrobials: Vancomycin 2/7 -  2/8; 2/18 - 2/21 Zosyn 2/21 -2/23 Ampicillin 2/7 -2/11 Metronidazole 2/7 - 2/8; 2/17 -2/18 Cefepime 2/18 - 2/21 Unasyn 3/24 - 3/24 Perioperative cefazolin Ceftriaxone 3/25 - 3/27 Cefazolin 3/27 - 4/1    Subjective: No issues noted overnight.  Objective: BP 100/85 (BP Location: Right Arm)   Pulse 88   Temp 97.7 F (36.5 C) (Oral)   Resp 16   Ht 5\' 10"  (1.778 m)   Wt 55.9 kg   SpO2 100%   BMI 17.68 kg/m   Examination:  General: Well appearing, no distress   Data Reviewed: I have personally reviewed following labs and imaging studies  CBC Lab Results  Component Value Date   WBC 4.0 10/11/2021   RBC 3.34 (L) 10/11/2021   HGB 10.0 (L) 10/11/2021   HCT 31.5 (L) 10/11/2021   MCV 94.3 10/11/2021   MCH 29.9 10/11/2021   PLT 283 10/11/2021   MCHC 31.7 10/11/2021   RDW 15.6 (H) 10/11/2021   LYMPHSABS 2.5 10/11/2021   MONOABS 0.5 10/11/2021   EOSABS 0.1 10/11/2021   BASOSABS 0.0 Q000111Q     Last metabolic panel Lab Results  Component Value Date   NA 137 10/15/2021   K 3.9 10/15/2021   CL 104 10/15/2021   CO2 27 10/15/2021   BUN 14 10/15/2021   CREATININE 1.01 10/15/2021   GLUCOSE 125 (H) 10/15/2021   GFRNONAA >60 10/15/2021   CALCIUM 10.1 10/15/2021   PHOS 4.5 09/24/2021   PROT 7.6 09/18/2021   ALBUMIN 3.2 (L) 09/20/2021   BILITOT 0.5 09/18/2021   ALKPHOS 110 09/18/2021   AST 28 09/18/2021   ALT 19 09/18/2021   ANIONGAP 6 10/15/2021    GFR: Estimated Creatinine Clearance: 61.5 mL/min (by C-G formula based on SCr of 1.01 mg/dL).  No results found for this or any previous visit (from the past 240 hour(s)).    Radiology Studies: No results found.    LOS: 110 days    Cordelia Poche, MD [facility] 10/17/2021, 7:11 AM   If 7PM-7AM, please contact night-coverage [email or web address]

## 2021-10-17 NOTE — TOC Progression Note (Signed)
Transition of Care [facility]) - Progression Note    Patient Details  Name: Brian [name] MRN: [id number] Date of Birth: 02/10/1961  Transition of Care [facility]) CM/SW [place], [place] Phone Number: 10/17/2021, 4:13 PM  Clinical Narrative:    CSW met with patient to discuss bed offer at [facility]. Patient said he was waiting to discuss with his son, but asked questions about what his stay would be like. CSW answered questions and discussed expectations with patient, he was appreciative of information. Patient said he really wants to get the rehab to get stronger and be able to go home. CSW reiterated that the only option for that would be going to [name]. Patient seemed to understand, but asked to be able to speak about it with his son before deciding. CSW to follow.      Barriers to Discharge: SNF Pending bed offer, SNF Pending payor source - LOG, SNF Pending Medicaid, Inadequate or no insurance  Expected Discharge Plan and Services   In-house Referral: Clinical Social Work                                             Social Determinants of Health (SDOH) Interventions    Readmission Risk Interventions     View : No data to display.

## 2021-10-17 NOTE — Progress Notes (Signed)
   Providing Compassionate, Quality Care - Together   Subjective: Patient reports no issues overnight. His son wasn't able to come by the hospital last night, so Brian [name] has not spoken with him regarding [name] yet.  Objective: Vital signs in last 24 hours: Temp:  [97.7 F (36.5 C)-98.4 F (36.9 C)] 98.4 F (36.9 C) (05/24 1125) Pulse Rate:  [83-100] 100 (05/24 1125) Resp:  [16-20] 18 (05/24 1125) BP: (100-119)/(74-85) 100/82 (05/24 1125) SpO2:  [99 %-100 %] 100 % (05/24 1125) Weight:  [55.9 kg] 55.9 kg (05/24 0457)  Intake/Output from previous day: 05/23 0701 - 05/24 0700 In: -  Out: 675 [Urine:675] Intake/Output this shift: Total I/O In: -  Out: 250 [Urine:250]  Sitting up in chair Alert; oriented to person, place, and time MAE, Generalized weakness Decreased fine motor BUE Incision is well-healed    Lab Results: No results for input(s): WBC, HGB, HCT, PLT in the last 72 hours. BMET Recent Labs    10/14/21 1528 10/15/21 0052  NA 140 137  K 3.9 3.9  CL 103 104  CO2 30 27  GLUCOSE 104* 125*  BUN 15 14  CREATININE 0.95 1.01  CALCIUM 10.5* 10.1    Studies/Results: No results found.  Assessment/Plan: Patient is doing well after a very complicated hospital course following four level ACDF. He would like to discharge home. Continue efforts at mobilization. [place] vs. Home at discharge.     LOS: 110 days     Brian Gilmore, DNP, AGNP-C Nurse Practitioner  [facility] [place] [address], [address], [place], [place] [address] P: [phone]    F: [phone]  10/17/2021, 1:13 PM

## 2021-10-17 NOTE — Plan of Care (Signed)

## 2021-10-17 NOTE — Progress Notes (Signed)
Physical Therapy Treatment Patient Details Name: Brian [name] MRN: [id number] DOB: 02-19-1961 Today's Date: 10/17/2021   History of Present Illness Pt is a 61 y/o male admitted 06/29/21 following C3-7 ACDF after progressive cervical myelopathy symptoms including ataxia, bil UE and LE shaking and weakness, and loss of function in his hands with subsequent fall. Pt initially had improvement in neurological symptoms, but postoperative course was complicated by dysphasia and ataxia secondary to and epidural hematoma with evacuation of hematoma on 2/7. Course complicated by delirium secondary to encephalopathy, sepsis secondary to aspiration PNA, reintubated 2/19-2/26 for airway protection, bilat segmental PEs 2/18. PEG placed 3/6. PMHx includes tobacco and EtOH use disorder.    PT Comments    Pt states "I fell reaching for my urinal yesterday". Today session focused on dynamic sitting balance with reaching tasks in all directions with both R and L UEs figuring out the patients point of safe balance vs off balance resulting in a fall. Pt with increased R hip pain today limiting ambulation tolerance. PT also worked on standing balance with even weight bearing t/o R and L LE due to pts left bias, attempted to use mirror for biofeedback however pt became upset and depressed stating "I don't even look like me, I can't look at that." Acute PT to cont to follow.    Recommendations for follow up therapy are one component of a multi-disciplinary discharge planning process, led by the attending physician.  Recommendations may be updated based on patient status, additional functional criteria and insurance authorization.  Follow Up Recommendations  Acute inpatient rehab (3hours/day)     Assistance Recommended at Discharge Frequent or constant Supervision/Assistance  Patient can return home with the following A little help with walking and/or transfers;A little help with bathing/dressing/bathroom;Assistance  with cooking/housework;Assist for transportation;Help with stairs or ramp for entrance   Equipment Recommendations  Rolling walker (2 wheels);Wheelchair (measurements PT);Wheelchair cushion (measurements PT)    Recommendations for Other Services Rehab consult     Precautions / Restrictions Precautions Precautions: Fall Precaution Booklet Issued: No Precaution Comments: previous G-tube site (pt pulled it out on evening of 4/29) Required Braces or Orthoses:  (brace is discharged) Cervical Brace:  (now d/c'd) Restrictions Weight Bearing Restrictions: No     Mobility  Bed Mobility               General bed mobility comments: up in the recliner    Transfers Overall transfer level: Needs assistance Equipment used: Rolling walker (2 wheels) Transfers: Sit to/from Stand, Bed to chair/wheelchair/BSC Sit to Stand: Min guard           General transfer comment: pt with close min guard, worked on sit to stand without using RW x 5 reps    Ambulation/Gait Ambulation/Gait assistance: Herbalist (Feet): 100 Feet Assistive device: Rolling walker (2 wheels) Gait Pattern/deviations: Step-through pattern, Decreased stride length, Ataxic Gait velocity: decr; Gait velocity interpretation: <1.31 ft/sec, indicative of household ambulator   General Gait Details: pt with increased R hip pain causing decreased ambulation tolerance   Stairs             Wheelchair Mobility    Modified Rankin (Stroke Patients Only)       Balance Overall balance assessment: Needs assistance Sitting-balance support: Feet supported, No upper extremity supported Sitting balance-Leahy Scale: Fair Sitting balance - Comments: worked on dynamic reaching exercises as pt states "i fall when trying to reach for stuff"   Standing balance support: Reliant on assistive device  for balance, Bilateral upper extremity supported, Single extremity supported Standing balance-Leahy Scale:  Poor Standing balance comment: worked on standing tolerance and evening weight on bilat LEs, pt with strong tendency to lean to the L, tactile cues to achieve midline                            Cognition Arousal/Alertness: Awake/alert Behavior During Therapy: WFL for tasks assessed/performed Overall Cognitive Status: Within Functional Limits for tasks assessed                                          Exercises General Exercises - Upper Extremity Digit Composite Flexion:  (yellow therapy putty) Other Exercises Other Exercises: worked on dynamic reaching with bilat UEs, guiding pt to feel his limited prior to "falling"    General Comments General comments (skin integrity, edema, etc.): VSS      Pertinent Vitals/Pain Pain Assessment Pain Assessment: 0-10 Pain Score: 2  Pain Location: R hip with onset of spasm Pain Descriptors / Indicators: Spasm, Pins and needles, Discomfort    Home Living                          Prior Function            PT Goals (current goals can now be found in the care plan section) Acute Rehab PT Goals Patient Stated Goal: home PT Goal Formulation: With patient Time For Goal Achievement: 10/29/21 Potential to Achieve Goals: Good Progress towards PT goals: Progressing toward goals    Frequency    Min 5X/week      PT Plan Current plan remains appropriate    Co-evaluation              AM-PAC PT "6 Clicks" Mobility   Outcome Measure  Help needed turning from your back to your side while in a flat bed without using bedrails?: A Little Help needed moving from lying on your back to sitting on the side of a flat bed without using bedrails?: A Little Help needed moving to and from a bed to a chair (including a wheelchair)?: A Lot Help needed standing up from a chair using your arms (e.g., wheelchair or bedside chair)?: A Lot Help needed to walk in hospital room?: A Lot (mod cues today) Help needed  climbing 3-5 steps with a railing? : A Lot (mod cues today) 6 Click Score: 14    End of Session Equipment Utilized During Treatment: Gait belt Activity Tolerance: Patient tolerated treatment well Patient left: in chair;with call bell/phone within reach;with chair alarm set Nurse Communication: Mobility status;Other (comment) PT Visit Diagnosis: Other abnormalities of gait and mobility (R26.89);Other symptoms and signs involving the nervous system (R29.898)     Time: [id number] PT Time Calculation (min) (ACUTE ONLY): 31 min  Charges:  $Gait Training: 8-22 mins $Neuromuscular Re-education: 8-22 mins                     Kittie Plater, PT, DPT Acute Rehabilitation Services Secure chat preferred Office #: [phone]    Berline Lopes 10/17/2021, 2:53 PM

## 2021-10-17 NOTE — NC FL2 (Signed)
[place] LEVEL OF CARE SCREENING TOOL     IDENTIFICATION  Patient Name: Brian [name] Birthdate: [birth year] Sex: male Admission Date (Current Location): 06/29/2021  [facility] and [place] Number:  Herbalist and Address:  [facility] [place], [place] [address], [place], [place] [address]      Provider Number: [id number]  Attending Physician Name and Address:  Earnie Larsson, MD  Relative Name and Phone Number:  [name],travis (Son)   [phone] [facility])    Current Level of Care: Hospital Recommended Level of Care: [place] Prior Approval Number:    Date Approved/Denied:   PASRR Number: [id number]  Discharge Plan: SNF    Current Diagnoses: Patient Active Problem List   Diagnosis Date Noted   Abnormal LFTs    Aspiration pneumonia (HCC)    Vomiting 09/13/2021   Citrobacter infection 09/10/2021   Normocytic anemia 09/04/2021   Thrombocytosis 09/04/2021   Fall during current hospitalization 08/30/2021   Lumbar back pain 08/27/2021   E. coli bacteremia 08/22/2021   Urinary retention 07/26/2021   Abdominal distention    Acute respiratory failure with hypoxia ([place]) 07/15/2021   Malnutrition of moderate degree 07/15/2021   Severe sepsis due to recurrent aspiration pneumonia 07/14/2021   Acute pulmonary embolism ([place]) 07/14/2021   Sinus tachycardia 07/13/2021   Elevated liver enzymes 07/12/2021   Hyponatremia 0000000   Acute metabolic encephalopathy [id number]   Tobacco use disorder 07/11/2021   Physical deconditioning 07/11/2021   Protein-calorie malnutrition, severe ([place]) 07/05/2021   Cervical myelopathy (HCC); severe cervical spinal stenosis with cord compression s/p ACDF [id number] on 2/3, complicated by epidural hematoma s/p evacuation 2/7 06/29/2021    Orientation RESPIRATION BLADDER Height & Weight     Self, Time, Situation, Place  Normal Continent Weight: 123 lb 3.8 oz (55.9 kg) Height:  5\' 10"  (177.8 cm)   BEHAVIORAL SYMPTOMS/MOOD NEUROLOGICAL BOWEL NUTRITION STATUS      Continent Diet (Regular)  AMBULATORY STATUS COMMUNICATION OF NEEDS Skin   Limited Assist Verbally Normal                       Personal Care Assistance Level of Assistance  Bathing, Feeding, Dressing Bathing Assistance: Limited assistance Feeding assistance: Independent Dressing Assistance: Limited assistance     Functional Limitations Info  Sight, Hearing, Speech Sight Info: Adequate Hearing Info: Adequate Speech Info: Adequate    SPECIAL CARE FACTORS FREQUENCY  PT (By licensed PT), OT (By licensed OT)     PT Frequency: 5x/wk OT Frequency: 5x/wk            Contractures Contractures Info: Not present    Additional Factors Info  Code Status, Allergies Code Status Info: Full Allergies Info: NKA           Current Medications (10/17/2021):  This is the current hospital active medication list Current Facility-Administered Medications  Medication Dose Route Frequency Provider Last Rate Last Admin   0.9 %  sodium chloride infusion  250 mL Intravenous Continuous Hosie Poisson, MD   Stopped at 08/20/21 1118   acetaminophen (TYLENOL) tablet 650 mg  650 mg Oral Q6H PRN Lavina Hamman, MD   650 mg at 10/16/21 1104   albuterol (PROVENTIL) (2.5 MG/3ML) 0.083% nebulizer solution 2.5 mg  2.5 mg Nebulization Q2H PRN Hosie Poisson, MD   2.5 mg at 07/17/21 1519   apixaban (ELIQUIS) tablet 5 mg  5 mg Oral BID Reome, Earle J, RPH   5 mg at  10/17/21 1030   baclofen (LIORESAL) tablet 20 mg  20 mg Oral TID Viona Gilmore D, NP   20 mg at 10/17/21 1030   bisacodyl (DULCOLAX) suppository 10 mg  10 mg Rectal Daily PRN Hosie Poisson, MD   10 mg at 07/17/21 0916   diphenhydrAMINE (BENADRYL) capsule 25 mg  25 mg Oral Q6H PRN Eugenie Filler, MD       finasteride (PROSCAR) tablet 5 mg  5 mg Oral Daily Florencia Reasons, MD   5 mg at Q000111Q 0000000   folic acid (FOLVITE) tablet 1 mg  1 mg Oral Daily Eugenie Filler, MD   1 mg  at 10/17/21 1030   HYDROcodone-acetaminophen (NORCO/VICODIN) 5-325 MG per tablet 1 tablet  1 tablet Oral Q6H PRN Lavina Hamman, MD   1 tablet at 10/16/21 2153   iohexol (OMNIPAQUE) 300 MG/ML solution 100 mL  100 mL Per Tube Once PRN Hosie Poisson, MD       ipratropium-albuterol (DUONEB) 0.5-2.5 (3) MG/3ML nebulizer solution 3 mL  3 mL Nebulization Q4H PRN Hosie Poisson, MD   3 mL at 07/29/21 0631   lactose free nutrition (BOOST PLUS) liquid 237 mL  237 mL Oral TID WC Earnie Larsson, MD   237 mL at 10/17/21 0858   MEDLINE mouth rinse  15 mL Mouth Rinse BID Raiford Noble Latif, DO   15 mL at 10/17/21 1031   melatonin tablet 3 mg  3 mg Oral QHS Florencia Reasons, MD   3 mg at 10/16/21 2148   multivitamin with minerals tablet 1 tablet  1 tablet Oral Daily Eugenie Filler, MD   1 tablet at 10/17/21 1030   ondansetron (ZOFRAN) injection 4 mg  4 mg Intravenous Q4H PRN Hosie Poisson, MD   4 mg at 10/14/21 1524   polyethylene glycol (MIRALAX / GLYCOLAX) packet 17 g  17 g Oral Daily PRN Lavina Hamman, MD       senna-docusate (Senokot-S) tablet 1 tablet  1 tablet Oral BID Lavina Hamman, MD   1 tablet at 10/17/21 1030   sodium phosphate (FLEET) 7-19 GM/118ML enema 1 enema  1 enema Rectal Daily PRN Hosie Poisson, MD   1 enema at 07/17/21 2340   tamsulosin (FLOMAX) capsule 0.8 mg  0.8 mg Oral QPC supper Florencia Reasons, MD   0.8 mg at 10/16/21 1835   thiamine tablet 100 mg  100 mg Oral Daily Eugenie Filler, MD   100 mg at 10/17/21 1030   white petrolatum (VASELINE) gel   Topical PRN Hosie Poisson, MD   Given at 08/17/21 2324     Discharge Medications: Please see discharge summary for a list of discharge medications.  Relevant Imaging Results:  Relevant Lab Results:   Additional Information SS#: [id number]  Geralynn Ochs, LCSW

## 2021-10-18 NOTE — Plan of Care (Signed)

## 2021-10-18 NOTE — Progress Notes (Signed)
   Providing Compassionate, Quality Care - Together   Subjective: Patient reports he does not want to get the Covid vaccine. No new issues.  Objective: Vital signs in last 24 hours: Temp:  [97.5 F (36.4 C)-98.9 F (37.2 C)] 98.2 F (36.8 C) (05/25 1510) Pulse Rate:  [76-105] 96 (05/25 1510) Resp:  [16-19] 17 (05/25 1510) BP: (102-135)/(72-89) 109/83 (05/25 1510) SpO2:  [100 %] 100 % (05/25 1510) Weight:  [56.3 kg] 56.3 kg (05/25 0428)  Intake/Output from previous day: 05/24 0701 - 05/25 0700 In: -  Out: 550 [Urine:550] Intake/Output this shift: Total I/O In: -  Out: 725 [Urine:725]  Sitting up in chair Alert; oriented to person, place, and time MAE, Generalized weakness Decreased fine motor BUE Incision is well-healed  Lab Results: No results for input(s): WBC, HGB, HCT, PLT in the last 72 hours. BMET No results for input(s): NA, K, CL, CO2, GLUCOSE, BUN, CREATININE, CALCIUM in the last 72 hours.  Studies/Results: No results found.  Assessment/Plan: Patient is doing well after a very complicated hospital course following four level ACDF. He would like to discharge home. Continue efforts at mobilization. [facility] vs. Home at discharge. If patient continues to refuse the Covid vaccine, [facility] will no longer be an option. Will continue to discuss with patient.   LOS: 111 days     Val Eagle, DNP, AGNP-C Nurse Practitioner  [facility] [address], [place], [place] [address] P: [phone]    F: [phone]  10/18/2021, 6:19 PM

## 2021-10-18 NOTE — Progress Notes (Signed)
PROGRESS NOTE    Brian [name]  [id number] DOB: 08/25/1960 DOA: 06/29/2021 PCP: Merryl Hacker, No  Primary service: Neurosurgery  Brief Narrative: Brian [name] is a 61 year old male with past medical history significant for tobacco and EtOH use disorder who was admitted by neurosurgery for cervical myelopathy due to critical multilevel cervical spinal canal stenosis C3-6 with spinal cord compression and spinal cord signal change after recent fall with progressive upper and lower extremity weakness and tremors. Pt transferred to the floor on 3/2 and PCCM requested transition to [facility] for medical assistance while patient remains under the neurosurgery service.  2/03 ACDF C3-4, C4-5, C5-5, and C6-7 w/ Dr. Annette Stable 2/07 Code stroke overnight, neg for LVO, CT showing soft tissue swelling. PCCM consulted for concern of airway management and AMS. Went for evacuation of epidural hematoma  2/18 CT Abd/Pelvis: showing bilateral segmental Pes, started on heparin and showing worsening pneumonia, febrile to  103.  PCT rising, 34.6, restarted on abx 2/19 possible aspiration w/ severe hypoxia; intubated; Switching from heparin to angiomax given subtherapeutic levels despite increase in rate. 2/23 Ketamine infusion added 2/26 extubated 3/02 Modified barium swallow, continues n.p.o. with core track in place,  3/06 s/p IR G-tube placement 3/24 E. coli UTI/bacteremia>> cefazolin x7 days per ID 4/14 repeat urine culture + Citrobacter Freundii that is Sensitive to Bactrim,  4/16 reinserted foley due to recurrent urinary retention 4/28 accidentally pulled out PEG tube the evening.   5/21 had 3 episode of vomiting without blood. Likely from constipation  Awaiting placement/improvement with mobility to be discharged home.   Assessment and Plan: Cervical myelopathy ([place]); severe cervical spinal stenosis with cord compression s/p ACDF [id number] on 2/3, complicated by epidural hematoma s/p evacuation 2/7 Awaiting SNF  placement per TOC vs continued improvement in strength and possible discharge home.  Acute respiratory failure with hypoxia (HCC) Likely Multifactorial with significant dysphagia and recurrent aspiration pneumonia events during initial hospitalization following ACDF surgery with postoperative complications of postoperative cervical hematoma. Patient did require ventilatory support in the intensive care unit and was successfully extubated on 07/22/2021.  Patient completed 5 days of Vancomycin/Cefepime > Vancomycin Zosyn for aspiration pneumonia. Now on room air.   Acute pulmonary embolism (HCC) CTA chest (2/7) showed bilateral segmental PE. LE [place] negative for DVT.  TTE with normal LV systolic function.  Supplemental oxygen weaned off. -Continue Eliquis 5 mg twice daily  Acute metabolic encephalopathy CT head, MRI brain, EEG, TSH, B12, ammonia unrevealing.  Etiology likely multifactorial with acute respiratory failure secondary to aspiration pneumonia, bilateral pulmonary embolism.  Resolved.  E. coli bacteremia Urine culture and blood cultures x2 08/17/2021 positive for E. coli. Completed 7-day course of Ceftriaxone > Cefazolin.  Severe sepsis due to recurrent aspiration pneumonia Treated with Vancomycin/Cefepime and transitioned to Vancomycin/Zosyn. Completed a 5 day treatment course.  -Continue aspiration precautions  Sinus tachycardia, resolved Multifactorial including sepsis, dehydration, PE, agitation. Patient started on metoprolol. Tachycardia now resolved. Metoprolol weaned down to 12.5 mg BID dosing. Metoprolol discontinued.  Elevated liver enzymes Etiology likely secondary to sepsis from aspiration pneumonia as above.  Acute hepatitis panel negative.  RUQ ultrasound unremarkable. Resolved.  Hyponatremia Etiology likely secondary to dehydration. Mild. Improved with good nutritional intake  Tobacco use disorder Counseled on need for complete cessation. Managed on nicotine patch which  is now discontinued.  Physical deconditioning Significant weakness in all extremities as a result of cervical myelopathy and acute illness.  Slowly improving while inpatient with physical therapy. Recommendation for inpatient rehabilitation  on discharge. -Continue PT/OT  Urinary retention Foley catheter discontinued on 08/09/2021; replaced on 09/09/2021 for recurrent urinary retention and removed again on 4/25. Resolved. -Continue tamsulosin and finasteride  Protein-calorie malnutrition, severe (HCC) Dietitian recommendations (5/9): Continue current diet as ordered, encourage PO intake Boost Plus TID to provide 360kcal and 14g of protein per carton Continue vitamin regimen  Vomiting In setting of tube feeds. Now resolved.  Citrobacter infection Noted on urine culture (4/14). Patient treated empirically with Ceftriaxone IV. Urine culture showed Ceftriaxone resistant strain. Ceftriaxone transitioned to Bactrim to complete a 7 day course of antibiotic therapy.  Thrombocytosis Likely Reactive. Now resolved.  Normocytic anemia Chronic and stable. Patient did require 1 unit of pRBC for acute anemia while in ICU, but unsure of etiology.  Fall during current hospitalization No injury. Patient with recurrent falls. -Continue fall precautions.    Lumbar back pain -Per primary  Hyperglycemia-resolved as of 07/12/2021 Likely due to Steroids. Hemoglobin A1C of 5.2%. Resolved.   DVT prophylaxis: Eliquis Code Status:   Code Status: Full Code Family Communication: None at bedside Disposition Plan: SNF when bed is available vs home. Medically stable for discharge. Patient has an offer at [facility] but requires a COVID vaccination. No contraindication, however patient has declined vaccination. Per primary team.   Consultants:  PCCM Infectious disease Neurology Palliative care General medicine  Procedures:  ACDF C3-7, neurosurgery Dr. Dutch Quint 2/3 Hematoma evacuation 2/7 Vascular  duplex ultrasound bilateral lower extremities 2/19 TTE 2/20 TTE 3/1 IR G-tube placement 3/6, 3/24  Antimicrobials: Vancomycin 2/7 - 2/8; 2/18 - 2/21 Zosyn 2/21 -2/23 Ampicillin 2/7 -2/11 Metronidazole 2/7 - 2/8; 2/17 -2/18 Cefepime 2/18 - 2/21 Unasyn 3/24 - 3/24 Perioperative cefazolin Ceftriaxone 3/25 - 3/27 Cefazolin 3/27 - 4/1    Subjective: No issues overnight. Patient discussed with me that there is a SNF that is willing to accept him, however they require a COVID vaccination. He states he does not agree to vaccination at this time and has his reasons. I inquired into if there was anything I could clear up for him, however he stated that he did not need any information cleared up. He states he is eager to start his rehab, but will not get the vaccine to do so.  Objective: BP 122/87 (BP Location: Left Arm)   Pulse 89   Temp 98.1 F (36.7 C) (Oral)   Resp 17   Ht 5\' 10"  (1.778 m)   Wt 56.3 kg   SpO2 100%   BMI 17.81 kg/m   Examination:  General: Well appearing, no distress   Data Reviewed: I have personally reviewed following labs and imaging studies  CBC Lab Results  Component Value Date   WBC 4.0 10/11/2021   RBC 3.34 (L) 10/11/2021   HGB 10.0 (L) 10/11/2021   HCT 31.5 (L) 10/11/2021   MCV 94.3 10/11/2021   MCH 29.9 10/11/2021   PLT 283 10/11/2021   MCHC 31.7 10/11/2021   RDW 15.6 (H) 10/11/2021   LYMPHSABS 2.5 10/11/2021   MONOABS 0.5 10/11/2021   EOSABS 0.1 10/11/2021   BASOSABS 0.0 10/11/2021     Last metabolic panel Lab Results  Component Value Date   NA 137 10/15/2021   K 3.9 10/15/2021   CL 104 10/15/2021   CO2 27 10/15/2021   BUN 14 10/15/2021   CREATININE 1.01 10/15/2021   GLUCOSE 125 (H) 10/15/2021   GFRNONAA >60 10/15/2021   CALCIUM 10.1 10/15/2021   PHOS 4.5 09/24/2021   PROT 7.6 09/18/2021  ALBUMIN 3.2 (L) 09/20/2021   BILITOT 0.5 09/18/2021   ALKPHOS 110 09/18/2021   AST 28 09/18/2021   ALT 19 09/18/2021   ANIONGAP 6  10/15/2021    GFR: Estimated Creatinine Clearance: 61.9 mL/min (by C-G formula based on SCr of 1.01 mg/dL).  No results found for this or any previous visit (from the past 240 hour(s)).    Radiology Studies: No results found.    LOS: 111 days    Jacquelin Hawking, MD [facility] 10/18/2021, 7:15 AM   If 7PM-7AM, please contact night-coverage [email or web address]

## 2021-10-18 NOTE — Progress Notes (Signed)
Occupational Therapy Treatment Patient Details Name: Brian [name] MRN: [id number] DOB: [birth year] Today's Date: 10/18/2021   History of present illness Pt is a 61 y/o male admitted 06/29/21 following C3-7 ACDF after progressive cervical myelopathy symptoms including ataxia, bil UE and LE shaking and weakness, and loss of function in his hands with subsequent fall. Pt initially had improvement in neurological symptoms, but postoperative course was complicated by dysphasia and ataxia secondary to and epidural hematoma with evacuation of hematoma on 2/7. Course complicated by delirium secondary to encephalopathy, sepsis secondary to aspiration PNA, reintubated 2/19-2/26 for airway protection, bilat segmental PEs 2/18. PEG placed 3/6. PMHx includes tobacco and EtOH use disorder.   OT comments  Patient up in recliner. Patient instructed on use of dressing stick to doff socks with min assist. Patient declined using sock aide and was mod assist to donn socks with assistance to get socks over top of foot. Patient performed mobility with RW and min assist to stand and min guard for balance. Patient instructed on reacher use and demonstrated good understanding. Acute OT to continue to follow.    Recommendations for follow up therapy are one component of a multi-disciplinary discharge planning process, led by the attending physician.  Recommendations may be updated based on patient status, additional functional criteria and insurance authorization.    Follow Up Recommendations  Skilled nursing-short term rehab (<3 hours/day)    Assistance Recommended at Discharge Set up Supervision/Assistance  Patient can return home with the following  A little help with walking and/or transfers;A little help with bathing/dressing/bathroom;Assistance with cooking/housework;Assistance with feeding;Assist for transportation;Help with stairs or ramp for entrance   Equipment Recommendations  Wheelchair (measurements  OT);Wheelchair cushion (measurements OT);Hospital bed;BSC/3in1;Tub/shower seat    Recommendations for Other Services      Precautions / Restrictions Precautions Precautions: Fall Precaution Booklet Issued: No Precaution Comments: previous G-tube site (pt pulled it out on evening of 4/29) Restrictions Weight Bearing Restrictions: No       Mobility Bed Mobility Overal bed mobility: Needs Assistance             General bed mobility comments: up in the recliner    Transfers Overall transfer level: Needs assistance Equipment used: Rolling walker (2 wheels) Transfers: Sit to/from Stand, Bed to chair/wheelchair/BSC Sit to Stand: Min assist           General transfer comment: patient required min assist to stand from recliner and min guard once up     Balance Overall balance assessment: Needs assistance Sitting-balance support: Feet supported, No upper extremity supported Sitting balance-Leahy Scale: Fair     Standing balance support: Reliant on assistive device for balance, Bilateral upper extremity supported, Single extremity supported Standing balance-Leahy Scale: Poor Standing balance comment: reliant on UE support                           ADL either performed or assessed with clinical judgement   ADL Overall ADL's : Needs assistance/impaired                     Lower Body Dressing: Moderate assistance;Sitting/lateral leans Lower Body Dressing Details (indicate cue type and reason): education on dressing stick to doff socks and donned socks without assistive device             Functional mobility during ADLs: Min guard;Rolling walker (2 wheels) General ADL Comments: education on reacher and dressing stick use. Patient declined using sock aide  Extremity/Trunk Assessment Upper Extremity Assessment RUE Deficits / Details: limited shoulder flexion RUE Coordination: decreased fine motor;decreased gross motor LUE Sensation: decreased  light touch LUE Coordination: decreased fine motor;decreased gross motor            Vision       Perception     Praxis      Cognition Arousal/Alertness: Awake/alert Behavior During Therapy: WFL for tasks assessed/performed Overall Cognitive Status: Within Functional Limits for tasks assessed                                          Exercises      Shoulder Instructions       General Comments reacher training    Pertinent Vitals/ Pain       Pain Assessment Pain Assessment: Faces Faces Pain Scale: Hurts a little bit Pain Location: Both hands Pain Descriptors / Indicators: Discomfort Pain Intervention(s): Monitored during session  Home Living                                          Prior Functioning/Environment              Frequency  Min 2X/week        Progress Toward Goals  OT Goals(current goals can now be found in the care plan section)  Progress towards OT goals: Progressing toward goals  Acute Rehab OT Goals Patient Stated Goal: get better OT Goal Formulation: With patient Time For Goal Achievement: 10/22/21 Potential to Achieve Goals: Good ADL Goals Pt Will Perform Eating: with min assist;with assist to don/doff brace/orthosis;with adaptive utensils;sitting Pt Will Perform Grooming: with supervision;sitting Pt Will Perform Upper Body Bathing: with min assist;with adaptive equipment;sitting Pt Will Transfer to Toilet: with min assist;stand pivot transfer;bedside commode Pt/caregiver will Perform Home Exercise Program: Increased ROM;Increased strength;Both right and left upper extremity;With theraputty;With minimal assist;With written HEP provided Additional ADL Goal #1: Pt will grasp objects with R hand and bring to mouth 10/10 trials in prep for self feeding Additional ADL Goal #2: Pt will grasp/release 5/5 objects with L hand to increase indep with ADLs  Plan Discharge plan remains appropriate;Frequency  remains appropriate    Co-evaluation                 AM-PAC OT "6 Clicks" Daily Activity     Outcome Measure   Help from another person eating meals?: A Little Help from another person taking care of personal grooming?: A Little Help from another person toileting, which includes using toliet, bedpan, or urinal?: A Lot Help from another person bathing (including washing, rinsing, drying)?: A Lot Help from another person to put on and taking off regular upper body clothing?: A Little Help from another person to put on and taking off regular lower body clothing?: A Lot 6 Click Score: 15    End of Session Equipment Utilized During Treatment: Gait belt;Rolling walker (2 wheels)  OT Visit Diagnosis: Other abnormalities of gait and mobility (R26.89);Muscle weakness (generalized) (M62.81);Feeding difficulties (R63.3);Other symptoms and signs involving the nervous system (R29.898);Other symptoms and signs involving cognitive function Pain - Right/Left: Right Pain - part of body: Hand   Activity Tolerance Patient tolerated treatment well   Patient Left in chair;with call bell/phone within reach;with chair alarm set   Nurse  Communication Mobility status        Time: [id number] OT Time Calculation (min): 21 min  Charges: OT General Charges $OT Visit: 1 Visit OT Treatments $Self Care/Home Management : 8-22 mins  Lodema Hong, [place]  Pager [phone] Office [place] 10/18/2021, 11:15 AM

## 2021-10-18 NOTE — TOC Progression Note (Signed)
Transition of Care [facility]) - Progression Note    Patient Details  Name: Brian [name] MRN: [id number] Date of Birth: Nov 24, 1960  Transition of Care [facility]) CM/SW Contact  Baldemar Lenis, [place] Phone Number: 10/18/2021, 11:00 AM  Clinical Narrative:   CSW updated by RN that patient reported to her that he is not interested in getting the covid vaccine to go to [facility] and was agitated about feeling like he was being forced into it. CSW updated MD, who will attempt to discuss patient's concerns with him over the next few days if he is receptive. There are no other placement options for patient until his disability is approved. CSW to follow.      Barriers to Discharge: SNF Pending bed offer, SNF Pending payor source - LOG, SNF Pending Medicaid, Inadequate or no insurance  Expected Discharge Plan and Services   In-house Referral: Clinical Social Work                                             Social Determinants of Health (SDOH) Interventions    Readmission Risk Interventions     View : No data to display.

## 2021-10-18 NOTE — Progress Notes (Signed)
Physical Therapy Treatment Patient Details Name: Brian [name] MRN: [id number] DOB: 10/26/1960 Today's Date: 10/18/2021   History of Present Illness Pt is a 61 y/o male admitted 06/29/21 following C3-7 ACDF after progressive cervical myelopathy symptoms including ataxia, bil UE and LE shaking and weakness, and loss of function in his hands with subsequent fall. Pt initially had improvement in neurological symptoms, but postoperative course was complicated by dysphasia and ataxia secondary to and epidural hematoma with evacuation of hematoma on 2/7. Course complicated by delirium secondary to encephalopathy, sepsis secondary to aspiration PNA, reintubated 2/19-2/26 for airway protection, bilat segmental PEs 2/18. PEG placed 3/6. PMHx includes tobacco and EtOH use disorder.    PT Comments    Continues to steadily progress toward goals.  Emphasis on standing balance with punching with truncal rotation cross-body with /without UE support and min guard assist, sit to stands, progression of gait stability/stamina.    Recommendations for follow up therapy are one component of a multi-disciplinary discharge planning process, led by the attending physician.  Recommendations may be updated based on patient status, additional functional criteria and insurance authorization.  Follow Up Recommendations  Acute inpatient rehab (3hours/day)     Assistance Recommended at Discharge Frequent or constant Supervision/Assistance  Patient can return home with the following A little help with walking and/or transfers;A little help with bathing/dressing/bathroom;Assistance with cooking/housework;Assist for transportation;Help with stairs or ramp for entrance   Equipment Recommendations  Rolling walker (2 wheels);Wheelchair (measurements PT);Wheelchair cushion (measurements PT)    Recommendations for Other Services       Precautions / Restrictions Precautions Precautions: Fall     Mobility  Bed  Mobility Overal bed mobility: Needs Assistance             General bed mobility comments: up in the recliner    Transfers Overall transfer level: Needs assistance Equipment used: Rolling walker (2 wheels) Transfers: Sit to/from Stand Sit to Stand: Min guard           General transfer comment: min guard later today from recliner multiple times from rest.    Ambulation/Gait Ambulation/Gait assistance: Min guard Gait Distance (Feet): 500 Feet Assistive device: Rolling walker (2 wheels) Gait Pattern/deviations: Step-through pattern, Decreased stride length Gait velocity: decr; Gait velocity interpretation: 1.31 - 2.62 ft/sec, indicative of limited community ambulator   General Gait Details: mildly ataxic or uncoordinated gait pattern, but able to look forward and maneuver the RW with a consistent step width and swing through.  Pt more able to control speed since he has self-owned the need for slightly slower speed.   Stairs             Wheelchair Mobility    Modified Rankin (Stroke Patients Only)       Balance     Sitting balance-Leahy Scale: Fair     Standing balance support: Reliant on assistive device for balance, During functional activity, No upper extremity supported, Bilateral upper extremity supported, Single extremity supported Standing balance-Leahy Scale: Fair (with personal setting up of his BOS) Standing balance comment: generally reliant on UE support, but able to position stance to do bil punching in about a 100 degree forward arc cross body low to just above head height. some w/bearing through 1 UE and some with no UE assist with up to 20 reps cross body punchs                            Cognition Arousal/Alertness:  Awake/alert Behavior During Therapy: WFL for tasks assessed/performed Overall Cognitive Status: Within Functional Limits for tasks assessed                                          Exercises Other  Exercises Other Exercises: see standing comments    General Comments        Pertinent Vitals/Pain Pain Assessment Pain Assessment: Faces Faces Pain Scale: Hurts a little bit Pain Location: Both hands Pain Descriptors / Indicators: Discomfort Pain Intervention(s): Monitored during session    Home Living                          Prior Function            PT Goals (current goals can now be found in the care plan section) Acute Rehab PT Goals Patient Stated Goal: home PT Goal Formulation: With patient Time For Goal Achievement: 10/29/21 Potential to Achieve Goals: Good Progress towards PT goals: Progressing toward goals    Frequency    Min 5X/week      PT Plan Current plan remains appropriate    Co-evaluation              AM-PAC PT "6 Clicks" Mobility   Outcome Measure  Help needed turning from your back to your side while in a flat bed without using bedrails?: A Little Help needed moving from lying on your back to sitting on the side of a flat bed without using bedrails?: A Little Help needed moving to and from a bed to a chair (including a wheelchair)?: A Little Help needed standing up from a chair using your arms (e.g., wheelchair or bedside chair)?: A Little Help needed to walk in hospital room?: A Little Help needed climbing 3-5 steps with a railing? : A Little 6 Click Score: 18    End of Session Equipment Utilized During Treatment: Gait belt Activity Tolerance: Patient tolerated treatment well Patient left: in chair;with call bell/phone within reach;with chair alarm set Nurse Communication: Mobility status;Other (comment) PT Visit Diagnosis: Other abnormalities of gait and mobility (R26.89);Other symptoms and signs involving the nervous system (R29.898)     Time: 1530-1605 PT Time Calculation (min) (ACUTE ONLY): 35 min  Charges:  $Gait Training: 8-22 mins $Therapeutic Exercise: 8-22 mins                     10/18/2021  Brian [name].,  PT Acute Rehabilitation Services [phone]  (pager) [phone]  (office)   Brian [name] 10/18/2021, 4:42 PM

## 2021-10-19 NOTE — Progress Notes (Signed)
   Providing Compassionate, Quality Care - Together   Subjective: Patient reports no issues overnight.  Objective: Vital signs in last 24 hours: Temp:  [97.5 F (36.4 C)-98.9 F (37.2 C)] 98.4 F (36.9 C) (05/26 0721) Pulse Rate:  [76-96] 87 (05/26 0721) Resp:  [16-19] 17 (05/26 0721) BP: (105-115)/(72-84) 108/79 (05/26 0721) SpO2:  [98 %-100 %] 98 % (05/26 0721) Weight:  [57.2 kg] 57.2 kg (05/26 0421)  Intake/Output from previous day: 05/25 0701 - 05/26 0700 In: -  Out: 1025 [Urine:1025] Intake/Output this shift: No intake/output data recorded.  Alert; oriented to person, place, and time MAE, Generalized weakness Decreased fine motor BUE Incision is well-healed  Lab Results: No results for input(s): WBC, HGB, HCT, PLT in the last 72 hours. BMET No results for input(s): NA, K, CL, CO2, GLUCOSE, BUN, CREATININE, CALCIUM in the last 72 hours.  Studies/Results: No results found.  Assessment/Plan: Patient is doing well after a very complicated hospital course following four level ACDF. He would like to discharge home. He does not want the Covid vaccine required for SNF admission and would like to focus his efforts on discharging home. Continue efforts at mobilization.   LOS: 112 days     Viona Gilmore, DNP, AGNP-C Nurse Practitioner  [facility] [place] [address], [place],  [address] P: [phone]    F: [phone]  10/19/2021, 10:17 AM

## 2021-10-19 NOTE — Progress Notes (Signed)
PROGRESS NOTE    Brian [name]  [id number] DOB: 08/25/1960 DOA: 06/29/2021 PCP: Merryl Hacker, No  Primary service: Neurosurgery  Brief Narrative: Brian [name] is a 61 year old male with past medical history significant for tobacco and EtOH use disorder who was admitted by neurosurgery for cervical myelopathy due to critical multilevel cervical spinal canal stenosis C3-6 with spinal cord compression and spinal cord signal change after recent fall with progressive upper and lower extremity weakness and tremors. Pt transferred to the floor on 3/2 and PCCM requested transition to [facility] for medical assistance while patient remains under the neurosurgery service.  2/03 ACDF C3-4, C4-5, C5-5, and C6-7 w/ Dr. Annette Stable 2/07 Code stroke overnight, neg for LVO, CT showing soft tissue swelling. PCCM consulted for concern of airway management and AMS. Went for evacuation of epidural hematoma  2/18 CT Abd/Pelvis: showing bilateral segmental Pes, started on heparin and showing worsening pneumonia, febrile to  103.  PCT rising, 34.6, restarted on abx 2/19 possible aspiration w/ severe hypoxia; intubated; Switching from heparin to angiomax given subtherapeutic levels despite increase in rate. 2/23 Ketamine infusion added 2/26 extubated 3/02 Modified barium swallow, continues n.p.o. with core track in place,  3/06 s/p IR G-tube placement 3/24 E. coli UTI/bacteremia>> cefazolin x7 days per ID 4/14 repeat urine culture + Citrobacter Freundii that is Sensitive to Bactrim,  4/16 reinserted foley due to recurrent urinary retention 4/28 accidentally pulled out PEG tube the evening.   5/21 had 3 episode of vomiting without blood. Likely from constipation  Awaiting placement/improvement with mobility to be discharged home.   Assessment and Plan: Cervical myelopathy ([place]); severe cervical spinal stenosis with cord compression s/p ACDF [id number] on 2/3, complicated by epidural hematoma s/p evacuation 2/7 Awaiting SNF  placement per TOC vs continued improvement in strength and possible discharge home.  Acute respiratory failure with hypoxia (HCC) Likely Multifactorial with significant dysphagia and recurrent aspiration pneumonia events during initial hospitalization following ACDF surgery with postoperative complications of postoperative cervical hematoma. Patient did require ventilatory support in the intensive care unit and was successfully extubated on 07/22/2021.  Patient completed 5 days of Vancomycin/Cefepime > Vancomycin Zosyn for aspiration pneumonia. Now on room air.   Acute pulmonary embolism (HCC) CTA chest (2/7) showed bilateral segmental PE. LE [place] negative for DVT.  TTE with normal LV systolic function.  Supplemental oxygen weaned off. -Continue Eliquis 5 mg twice daily  Acute metabolic encephalopathy CT head, MRI brain, EEG, TSH, B12, ammonia unrevealing.  Etiology likely multifactorial with acute respiratory failure secondary to aspiration pneumonia, bilateral pulmonary embolism.  Resolved.  E. coli bacteremia Urine culture and blood cultures x2 08/17/2021 positive for E. coli. Completed 7-day course of Ceftriaxone > Cefazolin.  Severe sepsis due to recurrent aspiration pneumonia Treated with Vancomycin/Cefepime and transitioned to Vancomycin/Zosyn. Completed a 5 day treatment course.  -Continue aspiration precautions  Sinus tachycardia, resolved Multifactorial including sepsis, dehydration, PE, agitation. Patient started on metoprolol. Tachycardia now resolved. Metoprolol weaned down to 12.5 mg BID dosing. Metoprolol discontinued.  Elevated liver enzymes Etiology likely secondary to sepsis from aspiration pneumonia as above.  Acute hepatitis panel negative.  RUQ ultrasound unremarkable. Resolved.  Hyponatremia Etiology likely secondary to dehydration. Mild. Improved with good nutritional intake  Tobacco use disorder Counseled on need for complete cessation. Managed on nicotine patch which  is now discontinued.  Physical deconditioning Significant weakness in all extremities as a result of cervical myelopathy and acute illness.  Slowly improving while inpatient with physical therapy. Recommendation for inpatient rehabilitation  on discharge. -Continue PT/OT  Urinary retention Foley catheter discontinued on 08/09/2021; replaced on 09/09/2021 for recurrent urinary retention and removed again on 4/25. Resolved. -Continue tamsulosin and finasteride  Protein-calorie malnutrition, severe (HCC) Dietitian recommendations (5/9): Continue current diet as ordered, encourage PO intake Boost Plus TID to provide 360kcal and 14g of protein per carton Continue vitamin regimen  Vomiting In setting of tube feeds. Now resolved.  Citrobacter infection Noted on urine culture (4/14). Patient treated empirically with Ceftriaxone IV. Urine culture showed Ceftriaxone resistant strain. Ceftriaxone transitioned to Bactrim to complete a 7 day course of antibiotic therapy.  Thrombocytosis Likely Reactive. Now resolved.  Normocytic anemia Chronic and stable. Patient did require 1 unit of pRBC for acute anemia while in ICU, but unsure of etiology.  Fall during current hospitalization No injury. Patient with recurrent falls. -Continue fall precautions.    Lumbar back pain -Per primary  Hyperglycemia-resolved as of 07/12/2021 Likely due to Steroids. Hemoglobin A1C of 5.2%. Resolved.   DVT prophylaxis: Eliquis Code Status:   Code Status: Full Code Family Communication: None at bedside Disposition Plan: SNF when bed is available vs home. Medically stable for discharge. Per primary team.   Consultants:  PCCM Infectious disease Neurology Palliative care General medicine  Procedures:  ACDF C3-7, neurosurgery Dr. Dutch Quint 2/3 Hematoma evacuation 2/7 Vascular duplex ultrasound bilateral lower extremities 2/19 TTE 2/20 TTE 3/1 IR G-tube placement 3/6, 3/24  Antimicrobials: Vancomycin 2/7 -  2/8; 2/18 - 2/21 Zosyn 2/21 -2/23 Ampicillin 2/7 -2/11 Metronidazole 2/7 - 2/8; 2/17 -2/18 Cefepime 2/18 - 2/21 Unasyn 3/24 - 3/24 Perioperative cefazolin Ceftriaxone 3/25 - 3/27 Cefazolin 3/27 - 4/1    Subjective: No issues noted overnight.  Objective: BP 108/79 (BP Location: Right Arm)   Pulse 87   Temp 98.4 F (36.9 C) (Oral)   Resp 17   Ht 5\' 10"  (1.778 m)   Wt 57.2 kg   SpO2 98%   BMI 18.09 kg/m   Examination:  General exam: Appears calm and comfortable    Data Reviewed: I have personally reviewed following labs and imaging studies  CBC Lab Results  Component Value Date   WBC 4.0 10/11/2021   RBC 3.34 (L) 10/11/2021   HGB 10.0 (L) 10/11/2021   HCT 31.5 (L) 10/11/2021   MCV 94.3 10/11/2021   MCH 29.9 10/11/2021   PLT 283 10/11/2021   MCHC 31.7 10/11/2021   RDW 15.6 (H) 10/11/2021   LYMPHSABS 2.5 10/11/2021   MONOABS 0.5 10/11/2021   EOSABS 0.1 10/11/2021   BASOSABS 0.0 10/11/2021     Last metabolic panel Lab Results  Component Value Date   NA 137 10/15/2021   K 3.9 10/15/2021   CL 104 10/15/2021   CO2 27 10/15/2021   BUN 14 10/15/2021   CREATININE 1.01 10/15/2021   GLUCOSE 125 (H) 10/15/2021   GFRNONAA >60 10/15/2021   CALCIUM 10.1 10/15/2021   PHOS 4.5 09/24/2021   PROT 7.6 09/18/2021   ALBUMIN 3.2 (L) 09/20/2021   BILITOT 0.5 09/18/2021   ALKPHOS 110 09/18/2021   AST 28 09/18/2021   ALT 19 09/18/2021   ANIONGAP 6 10/15/2021    GFR: Estimated Creatinine Clearance: 62.9 mL/min (by C-G formula based on SCr of 1.01 mg/dL).  No results found for this or any previous visit (from the past 240 hour(s)).    Radiology Studies: No results found.    LOS: 112 days    10/17/2021, MD [facility] 10/19/2021, 9:10 AM   If 7PM-7AM, please contact night-coverage  [email or web address]

## 2021-10-19 NOTE — Progress Notes (Signed)
Occupational Therapy Treatment Patient Details Name: Brian [name] MRN: [id number] DOB: 1961/05/23 Today's Date: 10/19/2021   History of present illness Pt is a 61 y/o male admitted 06/29/21 following C3-7 ACDF after progressive cervical myelopathy symptoms including ataxia, bil UE and LE shaking and weakness, and loss of function in his hands with subsequent fall. Pt initially had improvement in neurological symptoms, but postoperative course was complicated by dysphasia and ataxia secondary to and epidural hematoma with evacuation of hematoma on 2/7. Course complicated by delirium secondary to encephalopathy, sepsis secondary to aspiration PNA, reintubated 2/19-2/26 for airway protection, bilat segmental PEs 2/18. PEG placed 3/6. PMHx includes tobacco and EtOH use disorder.   OT comments  Patient received in supine and states that he has been sleeping in recliner this week but now has difficulty sleeping there and returned to bed today. Patient provided HEP with pictures for BUE hand ROM and dexterity exercises. Patietn instructed in exercises with min verbal cues to perform. Patient demonstrated good understanding of exercises and instructed to perform daily. Discussed potential RHS for RUE night wear to address finger flexion. Patient was agreeable to wear splint at night. OT to pursue further. Acute OT to continue to follow.    Recommendations for follow up therapy are one component of a multi-disciplinary discharge planning process, led by the attending physician.  Recommendations may be updated based on patient status, additional functional criteria and insurance authorization.    Follow Up Recommendations  Skilled nursing-short term rehab (<3 hours/day)    Assistance Recommended at Discharge Set up Supervision/Assistance  Patient can return home with the following  A little help with walking and/or transfers;A little help with bathing/dressing/bathroom;Assistance with  cooking/housework;Assistance with feeding;Assist for transportation;Help with stairs or ramp for entrance   Equipment Recommendations  Wheelchair (measurements OT);Wheelchair cushion (measurements OT);Hospital bed;BSC/3in1;Tub/shower seat    Recommendations for Other Services      Precautions / Restrictions Precautions Precautions: Fall Precaution Booklet Issued: No Precaution Comments: previous G-tube site (pt pulled it out on evening of 4/29) Restrictions Weight Bearing Restrictions: No       Mobility Bed Mobility               General bed mobility comments: patient seen at bed level    Transfers                   General transfer comment: Patient seen at bed level     Balance                                           ADL either performed or assessed with clinical judgement   ADL                                         General ADL Comments: focused on BUE hand HEP    Extremity/Trunk Assessment Upper Extremity Assessment RUE Deficits / Details: difficulty extending 4th and 5th digits RUE Coordination: decreased fine motor;decreased gross motor LUE Coordination: decreased fine motor;decreased gross motor            Vision       Perception     Praxis      Cognition Arousal/Alertness: Awake/alert Behavior During Therapy: WFL for tasks assessed/performed Overall Cognitive Status: Within Functional Limits for  tasks assessed                                 General Comments: patient in bed instead of recliner. States he has been sleeping in recliner had returned to bed due to having difficulty sleeping in recliner        Exercises Exercises: Hand exercises General Exercises - Upper Extremity Digit Composite Flexion: AROM, 10 reps Hand Exercises Wrist Flexion: AROM, Both, 10 reps Wrist Extension: AROM, Left, 10 reps Digit Composite Abduction: AROM, Both, 10 reps Thumb Abduction: AROM,  Both, 10 reps    Shoulder Instructions       General Comments      Pertinent Vitals/ Pain       Pain Assessment Pain Assessment: Faces Faces Pain Scale: Hurts little more Pain Location: Both hand with ROM exercises Pain Descriptors / Indicators: Discomfort, Grimacing Pain Intervention(s): Limited activity within patient's tolerance, Monitored during session  Home Living                                          Prior Functioning/Environment              Frequency  Min 2X/week        Progress Toward Goals  OT Goals(current goals can now be found in the care plan section)  Progress towards OT goals: Progressing toward goals  Acute Rehab OT Goals Patient Stated Goal: go home OT Goal Formulation: With patient Time For Goal Achievement: 10/22/21 Potential to Achieve Goals: Good ADL Goals Pt Will Perform Eating: with min assist;with assist to don/doff brace/orthosis;with adaptive utensils;sitting Pt Will Perform Grooming: with supervision;sitting Pt Will Perform Upper Body Bathing: with min assist;with adaptive equipment;sitting Pt Will Transfer to Toilet: with min assist;stand pivot transfer;bedside commode Pt/caregiver will Perform Home Exercise Program: Increased ROM;Increased strength;Both right and left upper extremity;With theraputty;With minimal assist;With written HEP provided Additional ADL Goal #1: Pt will grasp objects with R hand and bring to mouth 10/10 trials in prep for self feeding Additional ADL Goal #2: Pt will grasp/release 5/5 objects with L hand to increase indep with ADLs  Plan Discharge plan remains appropriate;Frequency remains appropriate    Co-evaluation                 AM-PAC OT "6 Clicks" Daily Activity     Outcome Measure   Help from another person eating meals?: A Little Help from another person taking care of personal grooming?: A Little Help from another person toileting, which includes using toliet,  bedpan, or urinal?: A Lot Help from another person bathing (including washing, rinsing, drying)?: A Lot Help from another person to put on and taking off regular upper body clothing?: A Little Help from another person to put on and taking off regular lower body clothing?: A Lot 6 Click Score: 15    End of Session Equipment Utilized During Treatment: Other (comment) (yellow therapy putty)  OT Visit Diagnosis: Other abnormalities of gait and mobility (R26.89);Muscle weakness (generalized) (M62.81);Feeding difficulties (R63.3);Other symptoms and signs involving the nervous system (R29.898);Other symptoms and signs involving cognitive function Pain - Right/Left: Right Pain - part of body: Hand   Activity Tolerance Patient tolerated treatment well   Patient Left in bed;with call bell/phone within reach;with bed alarm set   Nurse Communication Mobility status  Time: 1221-1249 OT Time Calculation (min): 28 min  Charges: OT General Charges $OT Visit: 1 Visit OT Treatments $Therapeutic Exercise: 23-37 mins  Lodema Hong, [place]  Pager [phone] [facility] [place] 10/19/2021, 1:09 PM

## 2021-10-19 NOTE — Progress Notes (Signed)
Physical Therapy Treatment Patient Details Name: Brian [name] MRN: [id number] DOB: 1960/09/05 Today's Date: 10/19/2021   History of Present Illness Pt is a 61 y/o male admitted 06/29/21 following C3-7 ACDF after progressive cervical myelopathy symptoms including ataxia, bil UE and LE shaking and weakness, and loss of function in his hands with subsequent fall. Pt initially had improvement in neurological symptoms, but postoperative course was complicated by dysphasia and ataxia secondary to and epidural hematoma with evacuation of hematoma on 2/7. Course complicated by delirium secondary to encephalopathy, sepsis secondary to aspiration PNA, reintubated 2/19-2/26 for airway protection, bilat segmental PEs 2/18. PEG placed 3/6. PMHx includes tobacco and EtOH use disorder.    PT Comments    Progressing well toward goals.  Emphasis on dynamic balance activity without UE assist  (ie--cross-body boxing), squats and progression of gait stability/stamina, quality.    Recommendations for follow up therapy are one component of a multi-disciplinary discharge planning process, led by the attending physician.  Recommendations may be updated based on patient status, additional functional criteria and insurance authorization.  Follow Up Recommendations  Acute inpatient rehab (3hours/day)     Assistance Recommended at Discharge Frequent or constant Supervision/Assistance  Patient can return home with the following A little help with walking and/or transfers;A little help with bathing/dressing/bathroom;Assistance with cooking/housework;Assist for transportation;Help with stairs or ramp for entrance   Equipment Recommendations  Rolling walker (2 wheels);Wheelchair (measurements PT);Wheelchair cushion (measurements PT)    Recommendations for Other Services       Precautions / Restrictions Precautions Precautions: Fall     Mobility  Bed Mobility               General bed mobility comments:  OOB on arrival    Transfers Overall transfer level: Needs assistance   Transfers: Sit to/from Stand Sit to Stand: Min guard                Ambulation/Gait Ambulation/Gait assistance: Min guard Gait Distance (Feet): 200 Feet Assistive device: Rolling walker (2 wheels) Gait Pattern/deviations: Step-through pattern, Decreased stride length   Gait velocity interpretation: <1.8 ft/sec, indicate of risk for recurrent falls   General Gait Details: mildly ataxic or uncoordinated gait pattern, but able to look forward and maneuver the RW with a consistent step width and swing through.  Pt more able to control speed since he has self-owned the need for slightly slower speed.   Stairs             Wheelchair Mobility    Modified Rankin (Stroke Patients Only)       Balance     Sitting balance-Leahy Scale: Fair       Standing balance-Leahy Scale: Fair Standing balance comment: balance exercise standing in RW, but not relying on it during 25 reps of  variable height/length cross-body punching to hit a target without any stability assist or AD                            Cognition Arousal/Alertness: Awake/alert Behavior During Therapy: WFL for tasks assessed/performed Overall Cognitive Status: Within Functional Limits for tasks assessed                                          Exercises Other Exercises Other Exercises: squats holding to a rail with both hands x 20 reps.  General Comments        Pertinent Vitals/Pain Pain Assessment Pain Assessment: Faces Faces Pain Scale: Hurts a little bit Pain Location: Both hand Pain Intervention(s): Monitored during session    Home Living                          Prior Function            PT Goals (current goals can now be found in the care plan section) Acute Rehab PT Goals PT Goal Formulation: With patient Time For Goal Achievement: 10/29/21 Potential to Achieve Goals:  Good Progress towards PT goals: Progressing toward goals    Frequency    Min 5X/week      PT Plan Current plan remains appropriate    Co-evaluation              AM-PAC PT "6 Clicks" Mobility   Outcome Measure  Help needed turning from your back to your side while in a flat bed without using bedrails?: A Little Help needed moving from lying on your back to sitting on the side of a flat bed without using bedrails?: A Little Help needed moving to and from a bed to a chair (including a wheelchair)?: A Little Help needed standing up from a chair using your arms (e.g., wheelchair or bedside chair)?: A Little Help needed to walk in hospital room?: A Little Help needed climbing 3-5 steps with a railing? : A Little 6 Click Score: 18    End of Session   Activity Tolerance: Patient tolerated treatment well Patient left: in chair;with call bell/phone within reach;with chair alarm set Nurse Communication: Mobility status;Other (comment) PT Visit Diagnosis: Other abnormalities of gait and mobility (R26.89);Other symptoms and signs involving the nervous system (J15.520)     Time: 8022-3361 PT Time Calculation (min) (ACUTE ONLY): 17 min  Charges:  $Gait Training: 8-22 mins                     10/19/2021  Jacinto Halim., PT Acute Rehabilitation Services [phone]  (pager) [phone]  (office)   [name] 10/19/2021, 6:10 PM

## 2021-10-20 NOTE — Progress Notes (Signed)
Occupational Therapy Treatment Patient Details Name: Brian [name] MRN: [id number] DOB: [birth year] Today's Date: 10/20/2021   History of present illness Pt is a 61 y/o male admitted 06/29/21 following C3-7 ACDF after progressive cervical myelopathy symptoms including ataxia, bil UE and LE shaking and weakness, and loss of function in his hands with subsequent fall. Pt initially had improvement in neurological symptoms, but postoperative course was complicated by dysphasia and ataxia secondary to and epidural hematoma with evacuation of hematoma on 2/7. Course complicated by delirium secondary to encephalopathy, sepsis secondary to aspiration PNA, reintubated 2/19-2/26 for airway protection, bilat segmental PEs 2/18. PEG placed 3/6. PMHx includes tobacco and EtOH use disorder.   OT comments  OT contacted about possible R resting hand splint for this patient.  Patient is having increased discomfort to his R hand, particularly with extension to digits 4 and 5 of his R hand.  Dr Annette Stable contacted regarding ordering of a R resting hand splint for nighttime use and wear to prevent further shortening of extrinsic hand mm.  Order placed and OT will follow up early next week to begin fitting and establishing wear schedule.  Patient encouraged to stretch R hand/fingers throughout the day.     Recommendations for follow up therapy are one component of a multi-disciplinary discharge planning process, led by the attending physician.  Recommendations may be updated based on patient status, additional functional criteria and insurance authorization.    Follow Up Recommendations  Skilled nursing-short term rehab (<3 hours/day)    Assistance Recommended at Discharge Set up Supervision/Assistance  Patient can return home with the following  A little help with walking and/or transfers;A little help with bathing/dressing/bathroom;Assistance with cooking/housework;Assistance with feeding;Assist for transportation;Help  with stairs or ramp for entrance   Equipment Recommendations  Wheelchair (measurements OT);Wheelchair cushion (measurements OT);Hospital bed;BSC/3in1;Tub/shower seat    Recommendations for Other Services      Precautions / Restrictions Precautions Precautions: Fall Precaution Booklet Issued: No Precaution Comments: previous G-tube site (pt pulled it out on evening of 4/29) Required Braces or Orthoses:  (brace is d/c) Cervical Brace:  (d/c'd) Restrictions Weight Bearing Restrictions: No                Extremity/Trunk Assessment Upper Extremity Assessment RUE Deficits / Details: difficulty extending 4th and 5th digits, initial pain with stretch. RUE Sensation: decreased light touch;decreased proprioception RUE Coordination: decreased fine motor;decreased gross motor                                                                                                 Pertinent Vitals/ Pain       Pain Assessment Pain Assessment: Faces Faces Pain Scale: Hurts little more Pain Location: Both hand R>L Pain Descriptors / Indicators: Discomfort, Grimacing Pain Intervention(s): Monitored during session  Frequency  Min 2X/week        Progress Toward Goals  OT Goals(current goals can now be found in the care plan section)  Progress towards OT goals: Progressing toward goals  Acute Rehab OT Goals OT Goal Formulation: With patient Time For Goal Achievement: 10/22/21 Potential to Achieve Goals: Good ADL Goals Additional ADL Goal #3: Patient will tolerate R resting hand splint for nighttime wear with no red areas, or skin integrity issues.  Plan Discharge plan remains appropriate;Frequency remains appropriate    Co-evaluation                 AM-PAC OT "6 Clicks" Daily Activity     Outcome Measure                    End of Session     OT Visit Diagnosis: Other abnormalities of gait and mobility (R26.89);Muscle weakness (generalized) (M62.81);Feeding difficulties (R63.3);Other symptoms and signs involving the nervous system (R29.898);Other symptoms and signs involving cognitive function Pain - Right/Left: Right Pain - part of body: Hand   Activity Tolerance Patient tolerated treatment well   Patient Left in chair;with call bell/phone within reach   Nurse Communication Other (comment) (order R hand splint)        Time: [id number] OT Time Calculation (min): 17 min  Charges: OT General Charges $OT Visit: 1 Visit OT Treatments $Therapeutic Exercise: 8-22 mins  10/20/2021  RP, OTR/L  Acute Rehabilitation Services  Office:  [phone]   Metta Clines 10/20/2021, 2:00 PM

## 2021-10-20 NOTE — Progress Notes (Signed)
Neurosurgery Service Progress Note  Subjective: No acute events overnight, no new complaints today   Objective: Vitals:   10/20/21 0500 10/20/21 0748 10/20/21 1144 10/20/21 1550  BP:  104/79 111/77 98/76  Pulse:  93 89 100  Resp:  18 18 18   Temp:  98.5 F (36.9 C) 97.9 F (36.6 C) 98 F (36.7 C)  TempSrc:  Oral Oral Oral  SpO2:  100% 100% 100%  Weight: 57.9 kg     Height:        Physical Exam: Sitting up in chair, brace on R arm, appears comfortable  Assessment & Plan: 61 y.o. man s/p 4 level ACDF, recovering well.  -PT/OT  Judith Part  10/20/21 7:54 PM

## 2021-10-20 NOTE — Progress Notes (Signed)
Physical Therapy Treatment Patient Details Name: Brian [name] MRN: [id number] DOB: [birth year] Today's Date: 10/20/2021   History of Present Illness Pt is a 61 y/o male admitted 06/29/21 following C3-7 ACDF after progressive cervical myelopathy symptoms including ataxia, bil UE and LE shaking and weakness, and loss of function in his hands with subsequent fall. Pt initially had improvement in neurological symptoms, but postoperative course was complicated by dysphasia and ataxia secondary to and epidural hematoma with evacuation of hematoma on 2/7. Course complicated by delirium secondary to encephalopathy, sepsis secondary to aspiration PNA, reintubated 2/19-2/26 for airway protection, bilat segmental PEs 2/18. PEG placed 3/6. PMHx includes tobacco and EtOH use disorder.    PT Comments    Pt seated in recliner.  He complains of pain in R leg and B hands R>L.  Pt limited this session due to pain.  Would benefit from wearing PRAFO ON R Leg with supported extension to stretch R heel cord.  Encouraged heat to hand during stretch of fingers into extension.      Recommendations for follow up therapy are one component of a multi-disciplinary discharge planning process, led by the attending physician.  Recommendations may be updated based on patient status, additional functional criteria and insurance authorization.  Follow Up Recommendations  Acute inpatient rehab (3hours/day)     Assistance Recommended at Discharge Frequent or constant Supervision/Assistance  Patient can return home with the following A little help with walking and/or transfers;A little help with bathing/dressing/bathroom;Assistance with cooking/housework;Assist for transportation;Help with stairs or ramp for entrance   Equipment Recommendations  Rolling walker (2 wheels);Wheelchair (measurements PT);Wheelchair cushion (measurements PT)    Recommendations for Other Services Rehab consult     Precautions / Restrictions  Precautions Precautions: Fall Precaution Booklet Issued: No Precaution Comments: previous G-tube site (pt pulled it out on evening of 4/29) Required Braces or Orthoses:  (brace is d/c) Cervical Brace:  (d/c'd) Restrictions Weight Bearing Restrictions: No     Mobility  Bed Mobility               General bed mobility comments: Seated in recliner on arrival.    Transfers Overall transfer level: Needs assistance Equipment used: Rolling walker (2 wheels) Transfers: Sit to/from Stand Sit to Stand: Supervision           General transfer comment: Cues for hand placement to rise into standing.  Hand painful during push into standing.    Ambulation/Gait Ambulation/Gait assistance: Min guard Gait Distance (Feet): 200 Feet Assistive device: Rolling walker (2 wheels) Gait Pattern/deviations: Step-through pattern, Decreased stride length, Ataxic, Antalgic, Narrow base of support, Trunk flexed, Decreased stance time - right, Decreased dorsiflexion - right Gait velocity: decr;     General Gait Details: mildly ataxic or uncoordinated gait pattern on R side, but able to look forward and maneuver the RW with a consistent step width and swing through.  Decreased heel strike on R side.   Pt required several standing rest breaks due to pain in R hand pushing on RW.  Cues for hip extension and trunk extension as well as stepping closer to device.  May benefit from R platform RW due to pain in R hand.   Stairs             Wheelchair Mobility    Modified Rankin (Stroke Patients Only)       Balance Overall balance assessment: Needs assistance Sitting-balance support: Feet supported, No upper extremity supported Sitting balance-Leahy Scale: Fair Sitting balance - Comments: worked on  dynamic reaching exercises as pt states "i fall when trying to reach for stuff" Postural control: Posterior lean Standing balance support: Reliant on assistive device for balance, During functional  activity, No upper extremity supported, Bilateral upper extremity supported, Single extremity supported                                Cognition Arousal/Alertness: Awake/alert Behavior During Therapy: WFL for tasks assessed/performed Overall Cognitive Status: Within Functional Limits for tasks assessed                                          Exercises Other Exercises Other Exercises: B digit extension stretch with heat pack on palm of hand pushing into folded towel.    General Comments        Pertinent Vitals/Pain Pain Assessment Pain Assessment: 0-10 Pain Score: 10-Worst pain ever Pain Location: Both hand R>L Pain Descriptors / Indicators: Discomfort, Grimacing Pain Intervention(s): Repositioned, Heat applied (encouraged gentle pressure stretching into extension on R hand.)    Home Living                          Prior Function            PT Goals (current goals can now be found in the care plan section) Acute Rehab PT Goals Patient Stated Goal: home Progress towards PT goals: Progressing toward goals    Frequency    Min 5X/week      PT Plan Current plan remains appropriate    Co-evaluation              AM-PAC PT "6 Clicks" Mobility   Outcome Measure  Help needed turning from your back to your side while in a flat bed without using bedrails?: A Little Help needed moving from lying on your back to sitting on the side of a flat bed without using bedrails?: A Little Help needed moving to and from a bed to a chair (including a wheelchair)?: A Little Help needed standing up from a chair using your arms (e.g., wheelchair or bedside chair)?: A Little Help needed to walk in hospital room?: A Little Help needed climbing 3-5 steps with a railing? : A Little 6 Click Score: 18    End of Session Equipment Utilized During Treatment: Gait belt Activity Tolerance: Patient limited by fatigue;Patient limited by pain Patient  left: in chair;with call bell/phone within reach;with chair alarm set Nurse Communication: Mobility status;Other (comment) PT Visit Diagnosis: Other abnormalities of gait and mobility (R26.89);Other symptoms and signs involving the nervous system (R29.898)     Time: [id number] PT Time Calculation (min) (ACUTE ONLY): 21 min  Charges:  $Gait Training: 8-22 mins                     Erasmo Leventhal , PTA Acute Rehabilitation Services Pager [phone] Office [phone]    Kendall Arnell Eli Hose 10/20/2021, 12:52 PM

## 2021-10-20 NOTE — Progress Notes (Signed)
Orthopedic Tech Progress Note Patient Details:  Brian [name] 17-Oct-1960 [id number]  Order for a R resting hand splint called into [facility] @ 1411.  Patient ID: Brian [name], male   DOB: 11/17/1960, 61 y.o.   MRN: [id number]  Docia Furl 10/20/2021, 2:16 PM

## 2021-10-21 LAB — MAGNESIUM: Magnesium: 2 mg/dL (ref 1.7–2.4)

## 2021-10-21 LAB — BASIC METABOLIC PANEL
Anion gap: 7 (ref 5–15)
BUN: 17 mg/dL (ref 6–20)
CO2: 24 mmol/L (ref 22–32)
Calcium: 10 mg/dL (ref 8.9–10.3)
Chloride: 106 mmol/L (ref 98–111)
Creatinine, Ser: 0.96 mg/dL (ref 0.61–1.24)
GFR, Estimated: >60 mL/min (ref >60)
Glucose, Bld: 114 mg/dL — ABNORMAL HIGH (ref 70–99)
Potassium: 4.4 mmol/L (ref 3.5–5.1)
Sodium: 137 mmol/L (ref 135–145)

## 2021-10-21 NOTE — Progress Notes (Signed)
Subjective: Patient reports that he is doing well overall. No new complaints. No acute events overnight.  Objective: Vital signs in last 24 hours: Temp:  [97.9 F (36.6 C)-98.8 F (37.1 C)] 98.5 F (36.9 C) (05/28 0833) Pulse Rate:  [85-100] 92 (05/28 0833) Resp:  [16-19] 18 (05/28 0833) BP: (97-114)/(72-81) 111/74 (05/28 0833) SpO2:  [99 %-100 %] 99 % (05/28 0833) Weight:  [58.2 kg] 58.2 kg (05/28 0500)  Intake/Output from previous day: 05/27 0701 - 05/28 0700 In: -  Out: 950 [Urine:950] Intake/Output this shift: No intake/output data recorded.  Physical Exam: Patient is awake, A/O X 4, conversant, and in good spirits. Eyes open spontaneously. They are in NAD and VSS. Doing well. Speech is fluent and appropriate. MAE. Generalized weakness. Incision is well approximated with no drainage, erythema, or edema.    Lab Results: No results for input(s): WBC, HGB, HCT, PLT in the last 72 hours. BMET No results for input(s): NA, K, CL, CO2, GLUCOSE, BUN, CREATININE, CALCIUM in the last 72 hours.  Studies/Results: No results found.  Assessment/Plan: 61 y.o. male who is s/p 4 level ACDF on 06/29/2021.  He is continuing to recover well. He has no new complaints. His neurological exam is stable.   -PT/OT -Continue to encourage mobilization.    LOS: 114 days     Council Mechanic, DNP, NP-C 10/21/2021, 11:04 AM

## 2021-10-22 DIAGNOSIS — M79641 Pain in right hand: Secondary | ICD-10-CM

## 2021-10-22 MED ORDER — GABAPENTIN 100 MG PO CAPS
100.0000 mg | ORAL_CAPSULE | Freq: Three times a day (TID) | ORAL | Status: DC
  Administered 2021-10-22 – 2021-10-29 (×22): 100 mg via ORAL
  Filled 2021-10-22 (×22): qty 1

## 2021-10-22 NOTE — Progress Notes (Signed)
Subjective: Patient reports doing well. No new complaints. No acute events overnight. He is optimistic about his recovery and in good spirits.   Objective: Vital signs in last 24 hours: Temp:  [97.7 F (36.5 C)-98.5 F (36.9 C)] 98.3 F (36.8 C) (05/29 0336) Pulse Rate:  [80-103] 100 (05/29 0336) Resp:  [16-19] 19 (05/28 1951) BP: (102-117)/(71-87) 108/71 (05/29 0336) SpO2:  [99 %-100 %] 100 % (05/29 0336) Weight:  [54.8 kg] 54.8 kg (05/29 0500)  Intake/Output from previous day: 05/28 0701 - 05/29 0700 In: -  Out: 350 [Urine:350] Intake/Output this shift: No intake/output data recorded.  Physical Exam: Patient is awake, A/O X 4, conversant, and in good spirits. Eyes open spontaneously. They are in NAD. Speech is fluent and appropriate. MAE. Generalized weakness. Incision is well approximated with no drainage, erythema, or edema.  Lab Results: No results for input(s): WBC, HGB, HCT, PLT in the last 72 hours. BMET Recent Labs    10/21/21 1811  NA 137  K 4.4  CL 106  CO2 24  GLUCOSE 114*  BUN 17  CREATININE 0.96  CALCIUM 10.0    Studies/Results: No results found.  Assessment/Plan: 61 y.o. male who is s/p 4 level ACDF on 06/29/2021.  He is continuing to recover well and make slow progress. He has no new complaints. His neurological exam is stable. Continue working on mobility and ambulation.   -PT/OT -Continue to encourage mobilization.       LOS: 115 days     Council Mechanic, DNP, AGNP-C Neurosurgery Nurse Practitioner  [facility] [address], [address], [place], [place] [address] P: [phone]    F: [phone]  10/22/2021 7:05 AM

## 2021-10-22 NOTE — Progress Notes (Signed)
Physical Therapy Treatment Patient Details Name: Brian [name] MRN: [id number] DOB: 10/31/1960 Today's Date: 10/22/2021   History of Present Illness Pt is a 61 y/o male admitted 06/29/21 following C3-7 ACDF after progressive cervical myelopathy symptoms including ataxia, bil UE and LE shaking and weakness, and loss of function in his hands with subsequent fall. Pt initially had improvement in neurological symptoms, but postoperative course was complicated by dysphasia and ataxia secondary to and epidural hematoma with evacuation of hematoma on 2/7. Course complicated by delirium secondary to encephalopathy, sepsis secondary to aspiration PNA, reintubated 2/19-2/26 for airway protection, bilat segmental PEs 2/18. PEG placed 3/6. PMHx includes tobacco and EtOH use disorder.    PT Comments    Pt with increased R hand pain and increased mm spasms limiting ambulation tolerance this date and gait fluidity. Spoke with pt regarding going to [facility] however pt adamant regarding not taking COVID vaccine.  Acute PT to cont to follow to progress mobility.    Recommendations for follow up therapy are one component of a multi-disciplinary discharge planning process, led by the attending physician.  Recommendations may be updated based on patient status, additional functional criteria and insurance authorization.  Follow Up Recommendations  Acute inpatient rehab (3hours/day)     Assistance Recommended at Discharge Frequent or constant Supervision/Assistance  Patient can return home with the following A little help with walking and/or transfers;A little help with bathing/dressing/bathroom;Assistance with cooking/housework;Assist for transportation;Help with stairs or ramp for entrance   Equipment Recommendations  Rolling walker (2 wheels);Wheelchair (measurements PT);Wheelchair cushion (measurements PT)    Recommendations for Other Services Rehab consult     Precautions / Restrictions  Precautions Precautions: Fall Precaution Comments: previous G-tube site (pt pulled it out on evening of 4/29) Required Braces or Orthoses:  (brace is d/c) Cervical Brace:  (d/c'd) Restrictions Weight Bearing Restrictions: No     Mobility  Bed Mobility Overal bed mobility: Needs Assistance Bed Mobility: Supine to Sit     Supine to sit: Modified independent (Device/Increase time)     General bed mobility comments: storng use of bed rail, HOB at 45 degrees, increased time    Transfers Overall transfer level: Needs assistance Equipment used: Rolling walker (2 wheels) Transfers: Sit to/from Stand Sit to Stand: Min assist           General transfer comment: v/c's for hand placement, increased time, minA to power up today due to increased mm spasms and bilat LEs adducting    Ambulation/Gait Ambulation/Gait assistance: Min assist Gait Distance (Feet): 180 Feet Assistive device: Rolling walker (2 wheels) Gait Pattern/deviations: Step-through pattern, Decreased stride length, Ataxic, Antalgic, Narrow base of support, Trunk flexed, Decreased stance time - right, Decreased dorsiflexion - right Gait velocity: decr; Gait velocity interpretation: <1.8 ft/sec, indicate of risk for recurrent falls   General Gait Details: pt requiring minA for walker management today due to increased dependency on UEs and pushign walker to far forward, pt with increased spasms today and R hand/wrist pain limiting amb tolerance and fluidity of gait pattern   Stairs             Wheelchair Mobility    Modified Rankin (Stroke Patients Only)       Balance Overall balance assessment: Needs assistance Sitting-balance support: Feet supported, No upper extremity supported Sitting balance-Leahy Scale: Fair Sitting balance - Comments: worked on dynamic reaching exercises as pt states "i fall when trying to reach for stuff" Postural control: Posterior lean Standing balance support: Reliant on  assistive device for balance, During functional activity, No upper extremity supported, Bilateral upper extremity supported, Single extremity supported Standing balance-Leahy Scale: Fair Standing balance comment: balance exercise standing in RW, but not relying on it during 25 reps of  variable height/length cross-body punching to hit a target without any stability assist or AD                            Cognition Arousal/Alertness: Awake/alert Behavior During Therapy: WFL for tasks assessed/performed Overall Cognitive Status: Within Functional Limits for tasks assessed                                 General Comments: pt aware he needs to go to [facility] but will not sacrifice his belief in "not needing the COVID vaccine." pt states "I don't have it, never had it, why do I need a vacine. I don't take the flu vacine either"        Exercises General Exercises - Upper Extremity Digit Composite Flexion: AROM, 10 reps    General Comments        Pertinent Vitals/Pain Pain Assessment Pain Assessment: Faces Faces Pain Scale: Hurts little more Pain Location: R hand Pain Descriptors / Indicators: Discomfort, Grimacing Pain Intervention(s): Monitored during session (has a resting hand splint for at night)    Home Living                          Prior Function            PT Goals (current goals can now be found in the care plan section) Acute Rehab PT Goals PT Goal Formulation: With patient Time For Goal Achievement: 10/29/21 Potential to Achieve Goals: Good Progress towards PT goals: Progressing toward goals    Frequency    Min 3X/week      PT Plan Current plan remains appropriate;Frequency needs to be updated    Co-evaluation              AM-PAC PT "6 Clicks" Mobility   Outcome Measure  Help needed turning from your back to your side while in a flat bed without using bedrails?: A Little Help needed moving from lying on  your back to sitting on the side of a flat bed without using bedrails?: A Little Help needed moving to and from a bed to a chair (including a wheelchair)?: A Little Help needed standing up from a chair using your arms (e.g., wheelchair or bedside chair)?: A Little Help needed to walk in hospital room?: A Little Help needed climbing 3-5 steps with a railing? : A Little 6 Click Score: 18    End of Session Equipment Utilized During Treatment: Gait belt Activity Tolerance: Patient limited by fatigue;Patient limited by pain (R hand) Patient left: in chair;with call bell/phone within reach;with chair alarm set Nurse Communication: Mobility status;Other (comment) PT Visit Diagnosis: Other abnormalities of gait and mobility (R26.89);Other symptoms and signs involving the nervous system (R29.898)     Time: [id number] PT Time Calculation (min) (ACUTE ONLY): 19 min  Charges:  $Gait Training: 8-22 mins                     Kittie Plater, PT, DPT Acute Rehabilitation Services Secure chat preferred Office #: [phone]    Berline Lopes 10/22/2021, 3:22 PM

## 2021-10-22 NOTE — Progress Notes (Signed)
PROGRESS NOTE    Brian [name]  [id number] DOB: [birth year] DOA: 06/29/2021 PCP: Merryl Hacker, No  Primary service: Neurosurgery  Brief Narrative: Brian [name] is a 61 year old male with past medical history significant for tobacco and EtOH use disorder who was admitted by neurosurgery for cervical myelopathy due to critical multilevel cervical spinal canal stenosis C3-6 with spinal cord compression and spinal cord signal change after recent fall with progressive upper and lower extremity weakness and tremors. Pt transferred to the floor on 3/2 and PCCM requested transition to [facility] for medical assistance while patient remains under the neurosurgery service.  2/03 ACDF C3-4, C4-5, C5-5, and C6-7 w/ Dr. Annette Stable 2/07 Code stroke overnight, neg for LVO, CT showing soft tissue swelling. PCCM consulted for concern of airway management and AMS. Went for evacuation of epidural hematoma  2/18 CT Abd/Pelvis: showing bilateral segmental Pes, started on heparin and showing worsening pneumonia, febrile to  103.  PCT rising, 34.6, restarted on abx 2/19 possible aspiration w/ severe hypoxia; intubated; Switching from heparin to angiomax given subtherapeutic levels despite increase in rate. 2/23 Ketamine infusion added 2/26 extubated 3/02 Modified barium swallow, continues n.p.o. with core track in place,  3/06 s/p IR G-tube placement 3/24 E. coli UTI/bacteremia>> cefazolin x7 days per ID 4/14 repeat urine culture + Citrobacter Freundii that is Sensitive to Bactrim,  4/16 reinserted foley due to recurrent urinary retention 4/28 accidentally pulled out PEG tube the evening.   5/21 had 3 episode of vomiting without blood. Likely from constipation  Awaiting placement/improvement with mobility to be discharged home.   Assessment and Plan: Cervical myelopathy ([place]); severe cervical spinal stenosis with cord compression s/p ACDF [id number] on 2/3, complicated by epidural hematoma s/p evacuation 2/7 Awaiting SNF  placement per TOC vs continued improvement in strength and possible discharge home.  Right hand pain Associated flexion of 4th and 5th digits. Patient reports pain especially in those digits but states his hand hurts enough to make it difficult for him to ambulate. He reports some shooting pain that propagates down his arm. Unsure if this is related to prior spinal surgery. Brace applied to right hand which has helped. -Add gabapentin  Acute respiratory failure with hypoxia ([place]) Likely Multifactorial with significant dysphagia and recurrent aspiration pneumonia events during initial hospitalization following ACDF surgery with postoperative complications of postoperative cervical hematoma. Patient did require ventilatory support in the intensive care unit and was successfully extubated on 07/22/2021.  Patient completed 5 days of Vancomycin/Cefepime > Vancomycin Zosyn for aspiration pneumonia. Now on room air.   Acute pulmonary embolism (HCC) CTA chest (2/7) showed bilateral segmental PE. LE [place] negative for DVT.  TTE with normal LV systolic function.  Supplemental oxygen weaned off. -Continue Eliquis 5 mg twice daily  Acute metabolic encephalopathy CT head, MRI brain, EEG, TSH, B12, ammonia unrevealing.  Etiology likely multifactorial with acute respiratory failure secondary to aspiration pneumonia, bilateral pulmonary embolism.  Resolved.  E. coli bacteremia Urine culture and blood cultures x2 08/17/2021 positive for E. coli. Completed 7-day course of Ceftriaxone > Cefazolin.  Severe sepsis due to recurrent aspiration pneumonia Treated with Vancomycin/Cefepime and transitioned to Vancomycin/Zosyn. Completed a 5 day treatment course.  -Continue aspiration precautions  Sinus tachycardia, resolved Multifactorial including sepsis, dehydration, PE, agitation. Patient started on metoprolol. Tachycardia now resolved. Metoprolol weaned down to 12.5 mg BID dosing. Metoprolol discontinued.  Elevated  liver enzymes Etiology likely secondary to sepsis from aspiration pneumonia as above.  Acute hepatitis panel negative.  RUQ ultrasound unremarkable.  Resolved.  Hyponatremia Etiology likely secondary to dehydration. Mild. Improved with good nutritional intake  Tobacco use disorder Counseled on need for complete cessation. Managed on nicotine patch which is now discontinued.  Physical deconditioning Significant weakness in all extremities as a result of cervical myelopathy and acute illness.  Slowly improving while inpatient with physical therapy. Recommendation for inpatient rehabilitation on discharge. -Continue PT/OT  Urinary retention Foley catheter discontinued on 08/09/2021; replaced on 09/09/2021 for recurrent urinary retention and removed again on 4/25. Resolved. -Continue tamsulosin and finasteride  Protein-calorie malnutrition, severe (HCC) Dietitian recommendations (5/9): Continue current diet as ordered, encourage PO intake Boost Plus TID to provide 360kcal and 14g of protein per carton Continue vitamin regimen  Vomiting In setting of tube feeds. Now resolved.  Citrobacter infection Noted on urine culture (4/14). Patient treated empirically with Ceftriaxone IV. Urine culture showed Ceftriaxone resistant strain. Ceftriaxone transitioned to Bactrim to complete a 7 day course of antibiotic therapy.  Thrombocytosis Likely Reactive. Now resolved.  Normocytic anemia Chronic and stable. Patient did require 1 unit of pRBC for acute anemia while in ICU, but unsure of etiology.  Fall during current hospitalization No injury. Patient with recurrent falls. -Continue fall precautions.    Lumbar back pain -Per primary  Hyperglycemia-resolved as of 07/12/2021 Likely due to Steroids. Hemoglobin A1C of 5.2%. Resolved.   DVT prophylaxis: Eliquis Code Status:   Code Status: Full Code Family Communication: None at bedside Disposition Plan: SNF when bed is available vs home.  Medically stable for discharge. Per primary team.   Consultants:  PCCM Infectious disease Neurology Palliative care General medicine  Procedures:  ACDF C3-7, neurosurgery Dr. Dutch Quint 2/3 Hematoma evacuation 2/7 Vascular duplex ultrasound bilateral lower extremities 2/19 TTE 2/20 TTE 3/1 IR G-tube placement 3/6, 3/24  Antimicrobials: Vancomycin 2/7 - 2/8; 2/18 - 2/21 Zosyn 2/21 -2/23 Ampicillin 2/7 -2/11 Metronidazole 2/7 - 2/8; 2/17 -2/18 Cefepime 2/18 - 2/21 Unasyn 3/24 - 3/24 Perioperative cefazolin Ceftriaxone 3/25 - 3/27 Cefazolin 3/27 - 4/1    Subjective: Patient reports some pain of his right hand, mainly in 4th and 5th digits. He also has limited ability to flex his right 4th and 5th digits. Brace applied over the weekend which has helped. Symptoms are affecting his ability to walk.  Objective: BP 108/71 (BP Location: Right Arm)   Pulse 100   Temp 98.3 F (36.8 C) (Oral)   Resp 19   Ht 5\' 10"  (1.778 m)   Wt 54.8 kg   SpO2 100%   BMI 17.33 kg/m   Examination:  General exam: Appears calm and comfortable Respiratory system: Respiratory effort normal. Central nervous system: Alert and oriented. Decreased extension of right 4th/5th digits. Painful sensation of entire 4th and 5th digits with no significant painful sensation of the rest of the hand. No numbness noted. Musculoskeletal: No edema. No calf tenderness Skin: No cyanosis. No rashes Psychiatry: Judgement and insight appear normal. Mood & affect appropriate.    Data Reviewed: I have personally reviewed following labs and imaging studies  CBC Lab Results  Component Value Date   WBC 4.0 10/11/2021   RBC 3.34 (L) 10/11/2021   HGB 10.0 (L) 10/11/2021   HCT 31.5 (L) 10/11/2021   MCV 94.3 10/11/2021   MCH 29.9 10/11/2021   PLT 283 10/11/2021   MCHC 31.7 10/11/2021   RDW 15.6 (H) 10/11/2021   LYMPHSABS 2.5 10/11/2021   MONOABS 0.5 10/11/2021   EOSABS 0.1 10/11/2021   BASOSABS 0.0 10/11/2021      Last  metabolic panel Lab Results  Component Value Date   NA 137 10/21/2021   K 4.4 10/21/2021   CL 106 10/21/2021   CO2 24 10/21/2021   BUN 17 10/21/2021   CREATININE 0.96 10/21/2021   GLUCOSE 114 (H) 10/21/2021   GFRNONAA >60 10/21/2021   CALCIUM 10.0 10/21/2021   PHOS 4.5 09/24/2021   PROT 7.6 09/18/2021   ALBUMIN 3.2 (L) 09/20/2021   BILITOT 0.5 09/18/2021   ALKPHOS 110 09/18/2021   AST 28 09/18/2021   ALT 19 09/18/2021   ANIONGAP 7 10/21/2021    GFR: Estimated Creatinine Clearance: 63.4 mL/min (by C-G formula based on SCr of 0.96 mg/dL).  No results found for this or any previous visit (from the past 240 hour(s)).    Radiology Studies: No results found.    LOS: 115 days    Cordelia Poche, MD [facility] 10/22/2021, 7:29 AM   If 7PM-7AM, please contact night-coverage [email or web address]

## 2021-10-23 NOTE — Progress Notes (Signed)
Occupational Therapy Treatment Patient Details Name: Brian [name] MRN: [id number] DOB: 14-Jun-1960 Today's Date: 10/23/2021   History of present illness Pt is a 61 y/o male admitted 06/29/21 following C3-7 ACDF after progressive cervical myelopathy symptoms including ataxia, bil UE and LE shaking and weakness, and loss of function in his hands with subsequent fall. Pt initially had improvement in neurological symptoms, but postoperative course was complicated by dysphasia and ataxia secondary to and epidural hematoma with evacuation of hematoma on 2/7. Course complicated by delirium secondary to encephalopathy, sepsis secondary to aspiration PNA, reintubated 2/19-2/26 for airway protection, bilat segmental PEs 2/18. PEG placed 3/6. PMHx includes tobacco and EtOH use disorder.   OT comments  Patient seated in recliner, completing exercises to [facility] upon entry.  Session focused on R UE ROM, stretch and splint fit. Educated and completed SROM supination and PROM wrist extension exercises, therapist completed PROM to fingers into extension before donning splint.  Noted splint strap tightness on thumb and lateral wrist, pt confirms edema and indentations after wearing since receiving splint. Plan to check splint in 2-3 hours, but will likely need to adjust. Will follow, plan to return today.    Recommendations for follow up therapy are one component of a multi-disciplinary discharge planning process, led by the attending physician.  Recommendations may be updated based on patient status, additional functional criteria and insurance authorization.    Follow Up Recommendations  Skilled nursing-short term rehab (<3 hours/day)    Assistance Recommended at Discharge Intermittent Supervision/Assistance  Patient can return home with the following  A little help with walking and/or transfers;A little help with bathing/dressing/bathroom;Assistance with cooking/housework;Assistance with feeding;Assist for  transportation;Help with stairs or ramp for entrance   Equipment Recommendations  Wheelchair (measurements OT);Wheelchair cushion (measurements OT);Hospital bed;BSC/3in1;Tub/shower seat    Recommendations for Other Services      Precautions / Restrictions Precautions Precautions: Fall Precaution Comments: previous G-tube site (pt pulled it out on evening of 4/29) Required Braces or Orthoses:  (brace is d/c) Cervical Brace:  (d/c'd) Restrictions Weight Bearing Restrictions: No       Mobility Bed Mobility               General bed mobility comments: OOB in recliner upon entry    Transfers                         Balance                                           ADL either performed or assessed with clinical judgement   ADL                                         General ADL Comments: session focused on R UE splint and ROM    Extremity/Trunk Assessment              Vision       Perception     Praxis      Cognition Arousal/Alertness: Awake/alert Behavior During Therapy: WFL for tasks assessed/performed Overall Cognitive Status: Within Functional Limits for tasks assessed  Exercises Exercises: Other exercises Other Exercises Other Exercises: R UE stretch exercises with self ROM--elbow extension and supination x 5; PROM of wrist, fingers as tolerated to R UE.    Shoulder Instructions       General Comments Patient seated in recliner and completing exercises upon entry.  Pt reports wearing splint most of the time, except when eating or doing exercises during the day and overnight.  He has to have assist from RN to don.  Once splint is donned, noted strap tightness about thumb and lateral wrist (near thumb), pt reports he has been having indentations and swelling when he takes off the splint.  Educated on some indentations is okay but to have it gone  by 5-10 minutes, pt reports it is gone in 1 hr.  Splint wearing trial, will return in 2-3 hours, but anticipate need to lengthen straps.    Pertinent Vitals/ Pain       Pain Assessment Pain Assessment: Faces Faces Pain Scale: Hurts little more Pain Location: R hand with stretch and ROM Pain Descriptors / Indicators: Discomfort, Grimacing Pain Intervention(s): Limited activity within patient's tolerance, Monitored during session, Repositioned  Home Living                                          Prior Functioning/Environment              Frequency  Min 2X/week        Progress Toward Goals  OT Goals(current goals can now be found in the care plan section)  Progress towards OT goals: Progressing toward goals  Acute Rehab OT Goals Patient Stated Goal: use my R hand better OT Goal Formulation: With patient Time For Goal Achievement: 11/06/21 Potential to Achieve Goals: Good  Plan Discharge plan remains appropriate;Frequency remains appropriate    Co-evaluation                 AM-PAC OT "6 Clicks" Daily Activity     Outcome Measure   Help from another person eating meals?: A Little Help from another person taking care of personal grooming?: A Little Help from another person toileting, which includes using toliet, bedpan, or urinal?: A Lot Help from another person bathing (including washing, rinsing, drying)?: A Lot Help from another person to put on and taking off regular upper body clothing?: A Little Help from another person to put on and taking off regular lower body clothing?: A Lot 6 Click Score: 15    End of Session Equipment Utilized During Treatment: Other (comment) (resting hand splint)  OT Visit Diagnosis: Other abnormalities of gait and mobility (R26.89);Muscle weakness (generalized) (M62.81);Feeding difficulties (R63.3);Other symptoms and signs involving the nervous system (R29.898);Other symptoms and signs involving cognitive  function Pain - Right/Left: Right Pain - part of body: Hand   Activity Tolerance Patient tolerated treatment well   Patient Left in chair;with call bell/phone within reach   Nurse Communication Mobility status        Time: [id number] OT Time Calculation (min): 20 min  Charges: OT General Charges $OT Visit: 1 Visit OT Treatments $Therapeutic Exercise: 8-22 mins  Jolaine Artist, [place] Office [phone]   Delight Stare 10/23/2021, 10:56 AM

## 2021-10-23 NOTE — Progress Notes (Addendum)
Occupational Therapy Treatment Patient Details Name: Brian [name] MRN: [id number] DOB: Nov 27, 1960 Today's Date: 10/23/2021   History of present illness Pt is a 61 y/o male admitted 06/29/21 following C3-7 ACDF after progressive cervical myelopathy symptoms including ataxia, bil UE and LE shaking and weakness, and loss of function in his hands with subsequent fall. Pt initially had improvement in neurological symptoms, but postoperative course was complicated by dysphasia and ataxia secondary to and epidural hematoma with evacuation of hematoma on 2/7. Course complicated by delirium secondary to encephalopathy, sepsis secondary to aspiration PNA, reintubated 2/19-2/26 for airway protection, bilat segmental PEs 2/18. PEG placed 3/6. PMHx includes tobacco and EtOH use disorder.   OT comments  Returned to check splint.  Adjusted and lengthened straps to improve fit, comfort and reduce pain.  Educated on strap tightness, wear schedule and recommendations (keep UE elevated, use at night). Pt continues to wear splint throughout the day, per his preference and he reports it decreases his pain.  Applied ice to wrist to aide in edema/pain reduction. Educated Therapist, sports about splint at well.  Will follow acutely.    Recommendations for follow up therapy are one component of a multi-disciplinary discharge planning process, led by the attending physician.  Recommendations may be updated based on patient status, additional functional criteria and insurance authorization.    Follow Up Recommendations  Skilled nursing-short term rehab (<3 hours/day)    Assistance Recommended at Discharge Intermittent Supervision/Assistance  Patient can return home with the following  A little help with walking and/or transfers;A little help with bathing/dressing/bathroom;Assistance with cooking/housework;Assistance with feeding;Assist for transportation;Help with stairs or ramp for entrance   Equipment Recommendations  Wheelchair  (measurements OT);Wheelchair cushion (measurements OT);Hospital bed;BSC/3in1;Tub/shower seat    Recommendations for Other Services      Precautions / Restrictions Precautions Precautions: Fall Precaution Comments: previous G-tube site (pt pulled it out on evening of 4/29) Required Braces or Orthoses:  (brace d/c) Cervical Brace:  (d/c'd) Restrictions Weight Bearing Restrictions: No       Mobility Bed Mobility               General bed mobility comments: OOB in recliner    Transfers                         Balance                                           ADL either performed or assessed with clinical judgement   ADL                                         General ADL Comments: session focused on splint modifications    Extremity/Trunk Assessment              Vision       Perception     Praxis      Cognition Arousal/Alertness: Awake/alert Behavior During Therapy: WFL for tasks assessed/performed Overall Cognitive Status: Within Functional Limits for tasks assessed                                          Exercises Exercises: Other  exercises Other Exercises Other Exercises: R UE stretch exercises with self ROM--elbow extension and supination x 5; PROM of wrist, fingers as tolerated to R UE.    Shoulder Instructions       General Comments pt seated in recliner, pt reports splint continues to make indentations in areas described last session.  Therapist lengthened 2 of the straps, and continued to provide education on wear schedule, strap tightness and benefits of splint.  Educated on removing splint if it is causing increased pain, edema or redness. Educated Therapist, sports as well and placing sign in room.    Pertinent Vitals/ Pain       Pain Assessment Pain Assessment: Faces Faces Pain Scale: Hurts even more Pain Location: R hand Pain Descriptors / Indicators: Discomfort, Grimacing Pain  Intervention(s): Limited activity within patient's tolerance, Monitored during session, Repositioned, Ice applied (RN notified)  Home Living                                          Prior Functioning/Environment              Frequency  Min 2X/week        Progress Toward Goals  OT Goals(current goals can now be found in the care plan section)  Progress towards OT goals: Progressing toward goals  Acute Rehab OT Goals Patient Stated Goal: use my right hand OT Goal Formulation: With patient Time For Goal Achievement: 11/06/21 Potential to Achieve Goals: Good ADL Goals Pt Will Perform Grooming: with supervision;sitting Pt Will Perform Upper Body Bathing: with min assist;with adaptive equipment;sitting Pt/caregiver will Perform Home Exercise Program: Increased ROM;Increased strength;Both right and left upper extremity;With theraputty;With minimal assist;With written HEP provided Additional ADL Goal #1: Pt will grasp objects with R hand and bring to mouth 10/10 trials in prep for self feeding. Additional ADL Goal #2: Patient will demonstrates ability to complete bimanual manipulation of ADL items with increased time on 5/5 trials. Additional ADL Goal #3: Pt will tolerate R resting hand splint for nighttime wear with no red areas or skin integrity issues.  Plan Discharge plan remains appropriate;Frequency remains appropriate    Co-evaluation                 AM-PAC OT "6 Clicks" Daily Activity     Outcome Measure   Help from another person eating meals?: A Little Help from another person taking care of personal grooming?: A Little Help from another person toileting, which includes using toliet, bedpan, or urinal?: A Lot Help from another person bathing (including washing, rinsing, drying)?: A Lot Help from another person to put on and taking off regular upper body clothing?: A Little Help from another person to put on and taking off regular lower body  clothing?: A Lot 6 Click Score: 15    End of Session Equipment Utilized During Treatment: Other (comment) (resting hand splint)  OT Visit Diagnosis: Other abnormalities of gait and mobility (R26.89);Muscle weakness (generalized) (M62.81);Feeding difficulties (R63.3);Other symptoms and signs involving the nervous system (R29.898);Other symptoms and signs involving cognitive function Pain - Right/Left: Right Pain - part of body: Hand   Activity Tolerance Patient tolerated treatment well   Patient Left in chair;with call bell/phone within reach   Nurse Communication Mobility status;Other (comment) (splint straps)        Time: [id number] OT Time Calculation (min): 28 min  Charges: OT General Charges $OT Visit:  1 Visit OT Treatments $Therapeutic Exercise: 8-22 mins $Orthotics Fit/Training: 8-22 mins $Orthotics/Prosthetics Check: 8-22 mins  Jolaine Artist, OT Acute Rehabilitation Services Office [phone]   Delight Stare 10/23/2021, 1:28 PM

## 2021-10-23 NOTE — Progress Notes (Signed)
Physical Therapy Treatment Patient Details Name: Brian [name] MRN: [id number] DOB: 11-25-1960 Today's Date: 10/23/2021   History of Present Illness Pt is a 61 y/o male admitted 06/29/21 following C3-7 ACDF after progressive cervical myelopathy symptoms including ataxia, bil UE and LE shaking and weakness, and loss of function in his hands with subsequent fall. Pt initially had improvement in neurological symptoms, but postoperative course was complicated by dysphasia and ataxia secondary to and epidural hematoma with evacuation of hematoma on 2/7. Course complicated by delirium secondary to encephalopathy, sepsis secondary to aspiration PNA, reintubated 2/19-2/26 for airway protection, bilat segmental PEs 2/18. PEG placed 3/6. PMHx includes tobacco and EtOH use disorder.    PT Comments    Pt continues with R hand/forearm pain limiting pt's ability to WB and ambulate safely. Focused on standing balance without UE support and sit to stand. Pt able to withstand minimal perturbations majority of time while in standing with close min guard/minA. Acute PT to cont to follow.    Recommendations for follow up therapy are one component of a multi-disciplinary discharge planning process, led by the attending physician.  Recommendations may be updated based on patient status, additional functional criteria and insurance authorization.  Follow Up Recommendations  Acute inpatient rehab (3hours/day)     Assistance Recommended at Discharge Frequent or constant Supervision/Assistance  Patient can return home with the following A little help with walking and/or transfers;A little help with bathing/dressing/bathroom;Assistance with cooking/housework;Assist for transportation;Help with stairs or ramp for entrance   Equipment Recommendations  Rolling walker (2 wheels);Wheelchair (measurements PT);Wheelchair cushion (measurements PT)    Recommendations for Other Services Rehab consult     Precautions /  Restrictions Precautions Precautions: Fall Precaution Booklet Issued: No Precaution Comments: previous G-tube site (pt pulled it out on evening of 4/29) Required Braces or Orthoses:  (brace d/c) Cervical Brace:  (d/c'd) Restrictions Weight Bearing Restrictions: No     Mobility  Bed Mobility               General bed mobility comments: OOB in recliner    Transfers Overall transfer level: Needs assistance Equipment used: None Transfers: Sit to/from Stand Sit to Stand: Mod assist           General transfer comment: worked on sit to stand using L UE only due to R hand pain and inability to WB, completed 6 trials    Ambulation/Gait                   Stairs             Wheelchair Mobility    Modified Rankin (Stroke Patients Only) Modified Rankin (Stroke Patients Only) Pre-Morbid Rankin Score: No symptoms Modified Rankin: Moderately severe disability     Balance Overall balance assessment: Needs assistance Sitting-balance support: Feet supported, No upper extremity supported Sitting balance-Leahy Scale: Fair Sitting balance - Comments: worked on dynamic reaching exercises as pt states "i fall when trying to reach for stuff" Postural control: Posterior lean Standing balance support: Reliant on assistive device for balance, During functional activity, No upper extremity supported, Bilateral upper extremity supported, Single extremity supported Standing balance-Leahy Scale: Fair Standing balance comment: worked on maintaing standing balance and keeping equal weight shift between L and R foot without UE support, also tested pt with minimal pertebations                            Cognition Arousal/Alertness: Awake/alert Behavior During  Therapy: WFL for tasks assessed/performed Overall Cognitive Status: Within Functional Limits for tasks assessed                                          Exercises      General Comments  General comments (skin integrity, edema, etc.): OT came in to work with R hand splint      Pertinent Vitals/Pain Pain Assessment Pain Assessment: Faces Faces Pain Scale: Hurts even more Pain Location: R hand, forearm,elbow Pain Descriptors / Indicators: Discomfort, Grimacing Pain Intervention(s): Monitored during session    Home Living                          Prior Function            PT Goals (current goals can now be found in the care plan section) Acute Rehab PT Goals PT Goal Formulation: With patient Time For Goal Achievement: 10/29/21 Potential to Achieve Goals: Good Progress towards PT goals: Progressing toward goals    Frequency    Min 3X/week      PT Plan Current plan remains appropriate;Frequency needs to be updated    Co-evaluation              AM-PAC PT "6 Clicks" Mobility   Outcome Measure  Help needed turning from your back to your side while in a flat bed without using bedrails?: A Little Help needed moving from lying on your back to sitting on the side of a flat bed without using bedrails?: A Little Help needed moving to and from a bed to a chair (including a wheelchair)?: A Little Help needed standing up from a chair using your arms (e.g., wheelchair or bedside chair)?: A Little Help needed to walk in hospital room?: A Little Help needed climbing 3-5 steps with a railing? : A Little 6 Click Score: 18    End of Session Equipment Utilized During Treatment: Gait belt Activity Tolerance: Patient limited by fatigue;Patient limited by pain (R hand) Patient left: in chair;with call bell/phone within reach;with chair alarm set (with OT present) Nurse Communication: Mobility status;Other (comment) PT Visit Diagnosis: Other abnormalities of gait and mobility (R26.89);Other symptoms and signs involving the nervous system (R29.898)     Time: 1610-9604 PT Time Calculation (min) (ACUTE ONLY): 37 min  Charges:  $Therapeutic Exercise: 8-22  mins $Neuromuscular Re-education: 8-22 mins                     [name], PT, DPT Acute Rehabilitation Services Secure chat preferred Office #: [phone]    [name] 10/23/2021, 2:11 PM

## 2021-10-23 NOTE — Progress Notes (Signed)
PROGRESS NOTE    Brian [name]  [id number] DOB: 1961/01/17 DOA: 06/29/2021 PCP: Merryl Hacker, No  Primary service: Neurosurgery  Brief Narrative: Brian [name] is a 61 year old male with past medical history significant for tobacco and EtOH use disorder who was admitted by neurosurgery for cervical myelopathy due to critical multilevel cervical spinal canal stenosis C3-6 with spinal cord compression and spinal cord signal change after recent fall with progressive upper and lower extremity weakness and tremors. Pt transferred to the floor on 3/2 and PCCM requested transition to [facility] for medical assistance while patient remains under the neurosurgery service.  2/03 ACDF C3-4, C4-5, C5-5, and C6-7 w/ Dr. Annette Stable 2/07 Code stroke overnight, neg for LVO, CT showing soft tissue swelling. PCCM consulted for concern of airway management and AMS. Went for evacuation of epidural hematoma  2/18 CT Abd/Pelvis: showing bilateral segmental Pes, started on heparin and showing worsening pneumonia, febrile to  103.  PCT rising, 34.6, restarted on abx 2/19 possible aspiration w/ severe hypoxia; intubated; Switching from heparin to angiomax given subtherapeutic levels despite increase in rate. 2/23 Ketamine infusion added 2/26 extubated 3/02 Modified barium swallow, continues n.p.o. with core track in place,  3/06 s/p IR G-tube placement 3/24 E. coli UTI/bacteremia>> cefazolin x7 days per ID 4/14 repeat urine culture + Citrobacter Freundii that is Sensitive to Bactrim,  4/16 reinserted foley due to recurrent urinary retention 4/28 accidentally pulled out PEG tube the evening.   5/21 had 3 episode of vomiting without blood. Likely from constipation  Awaiting placement/improvement with mobility to be discharged home.   Assessment and Plan: Cervical myelopathy ([place]); severe cervical spinal stenosis with cord compression s/p ACDF [id number] on 2/3, complicated by epidural hematoma s/p evacuation 2/7 Awaiting SNF  placement per TOC vs continued improvement in strength and possible discharge home.  Right hand pain Associated flexion of 4th and 5th digits. Patient reports pain especially in those digits but states his hand hurts enough to make it difficult for him to ambulate. He reports some shooting pain that propagates down his arm. Unsure if this is related to prior spinal surgery. Brace applied to right hand which has helped. -Continue gabapentin; titrate up as tolerated and as kidney function allows  Acute respiratory failure with hypoxia (HCC) Likely Multifactorial with significant dysphagia and recurrent aspiration pneumonia events during initial hospitalization following ACDF surgery with postoperative complications of postoperative cervical hematoma. Patient did require ventilatory support in the intensive care unit and was successfully extubated on 07/22/2021.  Patient completed 5 days of Vancomycin/Cefepime > Vancomycin Zosyn for aspiration pneumonia. Now on room air.   Acute pulmonary embolism (HCC) CTA chest (2/7) showed bilateral segmental PE. LE [place] negative for DVT.  TTE with normal LV systolic function.  Supplemental oxygen weaned off. -Continue Eliquis 5 mg twice daily  Acute metabolic encephalopathy CT head, MRI brain, EEG, TSH, B12, ammonia unrevealing.  Etiology likely multifactorial with acute respiratory failure secondary to aspiration pneumonia, bilateral pulmonary embolism.  Resolved.  E. coli bacteremia Urine culture and blood cultures x2 08/17/2021 positive for E. coli. Completed 7-day course of Ceftriaxone > Cefazolin.  Severe sepsis due to recurrent aspiration pneumonia Treated with Vancomycin/Cefepime and transitioned to Vancomycin/Zosyn. Completed a 5 day treatment course.  -Continue aspiration precautions  Sinus tachycardia, resolved Multifactorial including sepsis, dehydration, PE, agitation. Patient started on metoprolol. Tachycardia now resolved. Metoprolol weaned down to  12.5 mg BID dosing. Metoprolol discontinued.  Elevated liver enzymes Etiology likely secondary to sepsis from aspiration pneumonia as above.  Acute hepatitis panel negative.  RUQ ultrasound unremarkable. Resolved.  Hyponatremia Etiology likely secondary to dehydration. Mild. Improved with good nutritional intake  Tobacco use disorder Counseled on need for complete cessation. Managed on nicotine patch which is now discontinued.  Physical deconditioning Significant weakness in all extremities as a result of cervical myelopathy and acute illness.  Slowly improving while inpatient with physical therapy. Recommendation for inpatient rehabilitation on discharge. -Continue PT/OT  Urinary retention Foley catheter discontinued on 08/09/2021; replaced on 09/09/2021 for recurrent urinary retention and removed again on 4/25. Resolved. -Continue tamsulosin and finasteride  Protein-calorie malnutrition, severe ([place]) Dietitian recommendations (5/9): Continue current diet as ordered, encourage PO intake Boost Plus TID to provide 360kcal and 14g of protein per carton Continue vitamin regimen  Vomiting In setting of tube feeds. Now resolved.  Citrobacter infection Noted on urine culture (4/14). Patient treated empirically with Ceftriaxone IV. Urine culture showed Ceftriaxone resistant strain. Ceftriaxone transitioned to Bactrim to complete a 7 day course of antibiotic therapy.  Thrombocytosis Likely Reactive. Now resolved.  Normocytic anemia Chronic and stable. Patient did require 1 unit of pRBC for acute anemia while in ICU, but unsure of etiology.  Fall during current hospitalization No injury. Patient with recurrent falls. -Continue fall precautions.    Lumbar back pain -Per primary  Hyperglycemia-resolved as of 07/12/2021 Likely due to Steroids. Hemoglobin A1C of 5.2%. Resolved.   DVT prophylaxis: Eliquis Code Status:   Code Status: Full Code Family Communication: None at  bedside Disposition Plan: SNF when bed is available vs home. Medically stable for discharge. Per primary team.   Consultants:  PCCM Infectious disease Neurology Palliative care General medicine  Procedures:  ACDF C3-7, neurosurgery Dr. Trenton Gammon 2/3 Hematoma evacuation 2/7 Vascular duplex ultrasound bilateral lower extremities 2/19 TTE 2/20 TTE 3/1 IR G-tube placement 3/6, 3/24  Antimicrobials: Vancomycin 2/7 - 2/8; 2/18 - 2/21 Zosyn 2/21 -2/23 Ampicillin 2/7 -2/11 Metronidazole 2/7 - 2/8; 2/17 -2/18 Cefepime 2/18 - 2/21 Unasyn 3/24 - 3/24 Perioperative cefazolin Ceftriaxone 3/25 - 3/27 Cefazolin 3/27 - 4/1    Subjective: No acute issues noted overnight  Objective: BP 99/72 (BP Location: Left Arm)   Pulse 97   Temp 98.4 F (36.9 C) (Oral)   Resp 16   Ht 5\' 10"  (1.778 m)   Wt 54.8 kg   SpO2 99%   BMI 17.33 kg/m   Examination:  General exam: Appears calm and comfortable   Data Reviewed: I have personally reviewed following labs and imaging studies  CBC Lab Results  Component Value Date   WBC 4.0 10/11/2021   RBC 3.34 (L) 10/11/2021   HGB 10.0 (L) 10/11/2021   HCT 31.5 (L) 10/11/2021   MCV 94.3 10/11/2021   MCH 29.9 10/11/2021   PLT 283 10/11/2021   MCHC 31.7 10/11/2021   RDW 15.6 (H) 10/11/2021   LYMPHSABS 2.5 10/11/2021   MONOABS 0.5 10/11/2021   EOSABS 0.1 10/11/2021   BASOSABS 0.0 Q000111Q     Last metabolic panel Lab Results  Component Value Date   NA 137 10/21/2021   K 4.4 10/21/2021   CL 106 10/21/2021   CO2 24 10/21/2021   BUN 17 10/21/2021   CREATININE 0.96 10/21/2021   GLUCOSE 114 (H) 10/21/2021   GFRNONAA >60 10/21/2021   CALCIUM 10.0 10/21/2021   PHOS 4.5 09/24/2021   PROT 7.6 09/18/2021   ALBUMIN 3.2 (L) 09/20/2021   BILITOT 0.5 09/18/2021   ALKPHOS 110 09/18/2021   AST 28 09/18/2021   ALT 19 09/18/2021  ANIONGAP 7 10/21/2021    GFR: Estimated Creatinine Clearance: 63.4 mL/min (by C-G formula based on SCr of 0.96  mg/dL).  No results found for this or any previous visit (from the past 240 hour(s)).    Radiology Studies: No results found.    LOS: 116 days    Cordelia Poche, MD [facility] 10/23/2021, 12:54 PM   If 7PM-7AM, please contact night-coverage [email or web address]

## 2021-10-23 NOTE — Progress Notes (Signed)
Nutrition Follow-up  DOCUMENTATION CODES:  Severe malnutrition in context of social or environmental circumstances, Underweight  INTERVENTION:  Continue current diet as ordered, encourage PO intake Boost Plus TID to provide 360kcal and 14g of protein per carton Continue vitamin regimen  NUTRITION DIAGNOSIS:  Severe Malnutrition related to social / environmental circumstances as evidenced by severe muscle depletion, severe fat depletion.  - ongoing   GOAL:  Patient will meet greater than or equal to 90% of their needs  - progressing, increasing oral intake  MONITOR:  PO intake, Supplement acceptance, Skin, Weight trends, I & O's  REASON FOR ASSESSMENT:  Consult Assessment of nutrition requirement/status, Enteral/tube feeding initiation and management  ASSESSMENT:  Pt with hx EtOH abuse, tobacco use, and spinal stenosis initially presented 2/3 for planned multilevel anterior cervical decompression and fusion surgery after experiencing progressive bilateral upper and lower extremity weakness and spasticity due to critical multilevel cervical spinal stenosis with spinal cord compression and signal change.  2/3 - s/p anterior cervical discectomy with interbody fusion  2/5 - pt initially complained of worsening swallowing function 2/6 - MBS, NPO per SLP 2/7 - transferred to ICU, s/p re-exploration anterior cervical fusion with evacuation of epidural hematoma 2/8 - Cortrak tube placed (tip gastric), tube feeds initiated 2/10 - MBS, diet advanced to dysphagia 2 with nectar-thick liquids, Cortrak removed 2/19- Intubated after desaturation, possibly from PO intake 2/20 - s/p cortrak tube; post pyloric  2/26 - Extubated 3/02 - failed MBS 3/6 - PEG placed 3/23 - diet advanced to dysphagia 2 with nectar thick liquids s/p MBS 3/24 - PEG replaced 4/28 - Pt removed PEG  Pt stable at this time. Weight is overall trending up over the last month and continue to have good intake of meals and  supplements.  Of note, pt had a bed offer from [facility] but refused to get COVID19 vaccine, so bed offer was lost. Pt would like to discharge home, continues to work with therapy on getting stronger.  Nutritionally Relevant Medications: Scheduled Meds:  folic acid  1 mg Oral Daily   Boost Plus  237 mL Oral TID WC   multivitamin with minerals  1 tablet Oral Daily   senna-docusate  1 tablet Oral BID   thiamine  100 mg Oral Daily   PRN Meds: bisacodyl, diphenhydrAMINE, ondansetron, polyethylene glycol, sodium phosphate  Labs Reviewed  Diet Order:   Diet Order             Diet regular Room service appropriate? Yes with Assist; Fluid consistency: Thin  Diet effective now                  EDUCATION NEEDS:  Education needs have been addressed  Skin:  Skin Assessment: Reviewed RN Assessment  Last BM:  5/30 - type 5  Height:  Ht Readings from Last 1 Encounters:  07/15/21 5\' 10"  (1.778 m)   Weight:  Wt Readings from Last 1 Encounters:  10/22/21 54.8 kg   Ideal Body Weight:  78.2 kg  BMI:  Body mass index is 17.33 kg/m.  Estimated Nutritional Needs:  Kcal:  2100-2300 Protein:  105-125 grams Fluid:  >2L/day   Ranell Patrick, RD, LDN Clinical Dietitian RD pager # available in AMION  After hours/weekend pager # available in [facility]

## 2021-10-23 NOTE — Progress Notes (Signed)
   Providing Compassionate, Quality Care - Together   Subjective: Patient reports pain and limited mobility of his fourth and fifth digits on his right hand. Similar symptoms on the left, but not nearly as severe. The pain is keeping him up at night and not responding to his PO pain medication. OT has placed patient in a right resting hand splint. Dr. Caleb Popp started patient on gabapentin today.  Objective: Vital signs in last 24 hours: Temp:  [97.8 F (36.6 C)-98.1 F (36.7 C)] 98.1 F (36.7 C) (05/30 0726) Pulse Rate:  [73-89] 73 (05/30 0726) Resp:  [15-18] 16 (05/30 0726) BP: (100-118)/(76-84) 107/76 (05/30 0726) SpO2:  [99 %-100 %] 100 % (05/30 0726)  Intake/Output from previous day: No intake/output data recorded. Intake/Output this shift: No intake/output data recorded.  Sitting up in chair Alert; oriented to person, place, and time MAE, Generalized weakness Decreased fine motor BUE Right hand in splint Negative Tinel's in bilateral wrists and elbows Incision is well-healed  Lab Results: No results for input(s): WBC, HGB, HCT, PLT in the last 72 hours. BMET Recent Labs    10/21/21 1811  NA 137  K 4.4  CL 106  CO2 24  GLUCOSE 114*  BUN 17  CREATININE 0.96  CALCIUM 10.0    Studies/Results: No results found.  Assessment/Plan: Patient is doing well after a very complicated hospital course following four level ACDF. He would like to discharge home. Continue efforts at mobilization.    LOS: 116 days   -Agree with gabapentin and splinting for right hand   Val Eagle, DNP, AGNP-C Nurse Practitioner  [facility] [address], [place], [place] [address] P: [phone]    F: [phone]  10/23/2021, 11:33 AM

## 2021-10-24 NOTE — Progress Notes (Signed)
Physical Therapy Treatment Patient Details Name: Brian [name] MRN: [id number] DOB: 01/02/1961 Today's Date: 10/24/2021   History of Present Illness Pt is a 61 y/o male admitted 06/29/21 following C3-7 ACDF after progressive cervical myelopathy symptoms including ataxia, bil UE and LE shaking and weakness, and loss of function in his hands with subsequent fall. Pt initially had improvement in neurological symptoms, but postoperative course was complicated by dysphasia and ataxia secondary to and epidural hematoma with evacuation of hematoma on 2/7. Course complicated by delirium secondary to encephalopathy, sepsis secondary to aspiration PNA, reintubated 2/19-2/26 for airway protection, bilat segmental PEs 2/18. PEG placed 3/6. PMHx includes tobacco and EtOH use disorder.    PT Comments    Pt continues with R hand pain. Pt in good spirits and eager to amb. Pt amb 200' with RW and minA today. Pt continues with occasional spasms requiring minA to prevent fall during transfers and ambulation. Acute PT to cont to follow.    Recommendations for follow up therapy are one component of a multi-disciplinary discharge planning process, led by the attending physician.  Recommendations may be updated based on patient status, additional functional criteria and insurance authorization.  Follow Up Recommendations  Acute inpatient rehab (3hours/day)     Assistance Recommended at Discharge Frequent or constant Supervision/Assistance  Patient can return home with the following A little help with walking and/or transfers;A little help with bathing/dressing/bathroom;Assistance with cooking/housework;Assist for transportation;Help with stairs or ramp for entrance   Equipment Recommendations  Rolling walker (2 wheels);Wheelchair (measurements PT);Wheelchair cushion (measurements PT)    Recommendations for Other Services Rehab consult     Precautions / Restrictions Precautions Precautions: Fall Precaution  Booklet Issued: No Precaution Comments: previous G-tube site (pt pulled it out on evening of 4/29) Required Braces or Orthoses:  (has resting hand splint) Cervical Brace:  (d/c'd) Restrictions Weight Bearing Restrictions: No     Mobility  Bed Mobility               General bed mobility comments: OOB in recliner    Transfers Overall transfer level: Needs assistance Equipment used: Rolling walker (2 wheels) Transfers: Sit to/from Stand Sit to Stand: Min guard           General transfer comment: pt demo's safe hand placement and is able to stand with close min guard for safety due to episodes of spasms during transfer posing increased falls risk    Ambulation/Gait Ambulation/Gait assistance: Min assist Gait Distance (Feet): 200 Feet Assistive device: Rolling walker (2 wheels) Gait Pattern/deviations: Step-through pattern, Decreased stride length, Ataxic, Antalgic, Narrow base of support, Trunk flexed, Decreased stance time - right, Decreased dorsiflexion - right Gait velocity: decr; Gait velocity interpretation: <1.8 ft/sec, indicate of risk for recurrent falls   General Gait Details: verbal cues to stand in walker, minA for walker management   Stairs             Wheelchair Mobility    Modified Rankin (Stroke Patients Only) Modified Rankin (Stroke Patients Only) Pre-Morbid Rankin Score: No symptoms Modified Rankin: Moderately severe disability     Balance Overall balance assessment: Needs assistance Sitting-balance support: Feet supported, No upper extremity supported Sitting balance-Leahy Scale: Fair Sitting balance - Comments: worked on dynamic reaching exercises as pt states "i fall when trying to reach for stuff" Postural control: Posterior lean Standing balance support: Reliant on assistive device for balance, During functional activity, No upper extremity supported, Bilateral upper extremity supported, Single extremity supported Standing  balance-Leahy Scale: Fair  Standing balance comment: worked on maintaing standing balance and keeping equal weight shift between L and R foot without UE support, also tested pt with minimal pertebations                            Cognition Arousal/Alertness: Awake/alert Behavior During Therapy: WFL for tasks assessed/performed Overall Cognitive Status: Within Functional Limits for tasks assessed                                          Exercises      General Comments        Pertinent Vitals/Pain Pain Assessment Pain Assessment: Faces Faces Pain Scale: Hurts even more Pain Location: R hand, forearm,elbow Pain Descriptors / Indicators: Discomfort, Grimacing    Home Living                          Prior Function            PT Goals (current goals can now be found in the care plan section) Acute Rehab PT Goals Patient Stated Goal: home PT Goal Formulation: With patient Time For Goal Achievement: 10/29/21 Potential to Achieve Goals: Good Progress towards PT goals: Progressing toward goals    Frequency    Min 3X/week      PT Plan Current plan remains appropriate;Frequency needs to be updated    Co-evaluation              AM-PAC PT "6 Clicks" Mobility   Outcome Measure  Help needed turning from your back to your side while in a flat bed without using bedrails?: A Little Help needed moving from lying on your back to sitting on the side of a flat bed without using bedrails?: A Little Help needed moving to and from a bed to a chair (including a wheelchair)?: A Little Help needed standing up from a chair using your arms (e.g., wheelchair or bedside chair)?: A Little Help needed to walk in hospital room?: A Little Help needed climbing 3-5 steps with a railing? : A Little 6 Click Score: 18    End of Session Equipment Utilized During Treatment: Gait belt Activity Tolerance: Patient tolerated treatment well (R hand) Patient  left: in chair;with call bell/phone within reach;with chair alarm set (with OT present) Nurse Communication: Mobility status;Other (comment) PT Visit Diagnosis: Other abnormalities of gait and mobility (R26.89);Other symptoms and signs involving the nervous system (R29.898)     Time: [id number] PT Time Calculation (min) (ACUTE ONLY): 21 min  Charges:  $Gait Training: 8-22 mins                     Kittie Plater, PT, DPT Acute Rehabilitation Services Secure chat preferred Office #: [phone]    Berline Lopes 10/24/2021, 12:55 PM

## 2021-10-24 NOTE — Progress Notes (Signed)
   Providing Compassionate, Quality Care - Together   Subjective: Patient reports no issues overnight. Right hand still painful.  Objective: Vital signs in last 24 hours: Temp:  [98.1 F (36.7 C)-98.6 F (37 C)] 98.3 F (36.8 C) (05/31 1127) Pulse Rate:  [89-105] 98 (05/31 1127) Resp:  [16-18] 18 (05/31 1127) BP: (98-127)/(73-101) 101/73 (05/31 1127) SpO2:  [98 %-100 %] 100 % (05/31 1127) Weight:  [53.2 kg] 53.2 kg (05/31 0633)  Intake/Output from previous day: 05/30 0701 - 05/31 0700 In: 480 [P.O.:480] Out: 1025 [Urine:1025] Intake/Output this shift: Total I/O In: 240 [P.O.:240] Out: -   Sitting up in chair Alert; oriented to person, place, and time MAE, Generalized weakness Decreased fine motor BUE Incision is well-healed  Lab Results: No results for input(s): WBC, HGB, HCT, PLT in the last 72 hours. BMET Recent Labs    10/21/21 1811  NA 137  K 4.4  CL 106  CO2 24  GLUCOSE 114*  BUN 17  CREATININE 0.96  CALCIUM 10.0    Studies/Results: No results found.  Assessment/Plan: Patient is doing well after a very complicated hospital course following four level ACDF. He would like to discharge home. Continue efforts at mobilization.     LOS: 117 days     Val Eagle, DNP, AGNP-C Nurse Practitioner  [facility] [address], [address], [place], [place] [address] P: [phone]    F: [phone]  10/24/2021, 1:04 PM

## 2021-10-24 NOTE — Progress Notes (Signed)
PROGRESS NOTE    Brian [name]  [id number] DOB: Oct 13, 1960 DOA: 06/29/2021 PCP: Merryl Hacker, No  Primary service: Neurosurgery  Brief Narrative: Brian [name] is a 61 year old male with past medical history of tobacco and alcohol use was admitted by neurosurgery for cervical myelopathy due to critical multilevel cervical spinal canal stenosis at C3-C6 with spinal cord compression.  TRH on board for medical assistance.Awaiting placement/improvement with mobility to be discharged home.  Assessment and Plan: Cervical myelopathy ([place]); severe cervical spinal stenosis with cord compression s/p ACDF [id number] on 2/3, complicated by epidural hematoma s/p evacuation 2/7 Currently awaiting for skilled nursing facility placement.  Right hand pain Associated flexion of 4th and 5th digits.  Continue gabapentin and brace.  Feels little better with pain.  Acute respiratory failure with hypoxia ([place]) Had recurrent aspiration pneumonia during hospitalization.  Was intubated and successfully extubated during hospitalization.  Has completed course of antibiotic for aspiration pneumonia.  Currently on room air.  Appears stable at this time.  Acute pulmonary embolism ([facility]) CTA chest (2/7) showed bilateral segmental PE. LE [place] negative for DVT.  TTE with normal LV systolic function.  Continue Eliquis.  Acute metabolic encephalopathy CT head, MRI brain, EEG, TSH, B12, ammonia was unremarkable.   E. coli bacteremia Urine culture and blood cultures x2 08/17/2021 positive for E. coli. Completed 7-day course of Ceftriaxone > Cefazolin.  Severe sepsis due to recurrent aspiration pneumonia Completed course of vancomycin and Zosyn  Sinus tachycardia, resolved To be multifactorial secondary to dehydration sepsis pulmonary embolism.  On metoprolol.  Elevated liver enzymes Hepatitis panel was negative.  Right upper quadrant ultrasound was unremarkable has resolved at this time.  Hyponatremia Resolved.  Latest  sodium of 137.  Tobacco use disorder Off nicotine patch.  Physical deconditioning Significant weakness in all extremities as a result of cervical myelopathy and acute illness.  Continue physical therapy occupational therapy.  Plan for CIR.  Urinary retention Foley catheter discontinued on 08/09/2021; replaced on 09/09/2021 for recurrent urinary retention and removed again on 4/25.  Continue Flomax and finasteride.    Severe protein-calorie malnutrition, Continue with dietary supplements.  Thrombocytosis Has resolved.  Normocytic anemia Chronic and stable.  No recent CBC but did require 1 PRBC during hospitalization  Fall during current hospitalization Patient with recurrent falls.  Would benefit from rehabilitation.  Hyperglycemia-resolved as of 07/12/2021 Likely due to Steroids. Hemoglobin A1C of 5.2%. Resolved.   DVT prophylaxis: Eliquis  Code Status:   Code Status: Full Code  Family Communication: Communicated with the patient at bedside.  Disposition Plan:  Medically stable for disposition.  Awaiting for disposition.  Consultants:  PCCM Infectious disease Neurology Palliative care General medicine  Procedures:  ACDF C3-7, neurosurgery Dr. Trenton Gammon 2/3 Hematoma evacuation 2/7 Vascular duplex ultrasound bilateral lower extremities 2/19 TTE 2/20 TTE 3/1 IR G-tube placement 3/6, 3/24  Antimicrobials: Vancomycin 2/7 - 2/8; 2/18 - 2/21 Zosyn 2/21 -2/23 Ampicillin 2/7 -2/11 Metronidazole 2/7 - 2/8; 2/17 -2/18 Cefepime 2/18 - 2/21 Unasyn 3/24 - 3/24 Perioperative cefazolin Ceftriaxone 3/25 - 3/27 Cefazolin 3/27 - 4/1   Subjective: Today, patient was seen and examined at bedside.  Denies interval complaints.    Objective:    10/24/2021   11:27 AM 10/24/2021    8:07 AM 10/24/2021    6:33 AM  Vitals with BMI  Weight   123XX123 lbs 5 oz  Systolic [id number] 98   Diastolic 73 73   Pulse 98 89     Physical examination: General: Thinly built,  not in obvious  distress HENT:   No scleral pallor or icterus noted. Oral mucosa is moist.  Chest:  Clear breath sounds.  Diminished breath sounds bilaterally. No crackles or wheezes.  CVS: S1 &S2 heard. No murmur.  Regular rate and rhythm. Abdomen: Soft, nontender, nondistended.  Bowel sounds are heard.   Extremities: Generalized weakness noted.  Psych: Alert, awake and oriented, normal mood CNS:  No cranial nerve deficits.  Generalized weakness noted. Skin: Warm and dry.  No rashes noted.  Data Reviewed: I have personally reviewed the following labs and imaging studies   CBC Lab Results  Component Value Date   WBC 4.0 10/11/2021   RBC 3.34 (L) 10/11/2021   HGB 10.0 (L) 10/11/2021   HCT 31.5 (L) 10/11/2021   MCV 94.3 10/11/2021   MCH 29.9 10/11/2021   PLT 283 10/11/2021   MCHC 31.7 10/11/2021   RDW 15.6 (H) 10/11/2021   LYMPHSABS 2.5 10/11/2021   MONOABS 0.5 10/11/2021   EOSABS 0.1 10/11/2021   BASOSABS 0.0 [id number]     Last metabolic panel Lab Results  Component Value Date   NA 137 10/21/2021   K 4.4 10/21/2021   CL 106 10/21/2021   CO2 24 10/21/2021   BUN 17 10/21/2021   CREATININE 0.96 10/21/2021   GLUCOSE 114 (H) 10/21/2021   GFRNONAA >60 10/21/2021   CALCIUM 10.0 10/21/2021   PHOS 4.5 09/24/2021   PROT 7.6 09/18/2021   ALBUMIN 3.2 (L) 09/20/2021   BILITOT 0.5 09/18/2021   ALKPHOS 110 09/18/2021   AST 28 09/18/2021   ALT 19 09/18/2021   ANIONGAP 7 10/21/2021    GFR: Estimated Creatinine Clearance: 61.6 mL/min (by C-G formula based on SCr of 0.96 mg/dL).  No results found for this or any previous visit (from the past 240 hour(s)).    Radiology Studies: No results found.    LOS: 117 days    Oscar La, MD [facility] 10/24/2021, 1:14 PM  If 7PM-7AM, please contact night-coverage [email or web address]

## 2021-10-24 NOTE — Progress Notes (Signed)
Occupational Therapy Treatment Patient Details Name: Brian [name] MRN: [id number] DOB: [birth year] Today's Date: 10/24/2021   History of present illness Pt is a 61 y/o male admitted 06/29/21 following C3-7 ACDF after progressive cervical myelopathy symptoms including ataxia, bil UE and LE shaking and weakness, and loss of function in his hands with subsequent fall. Pt initially had improvement in neurological symptoms, but postoperative course was complicated by dysphasia and ataxia secondary to and epidural hematoma with evacuation of hematoma on 2/7. Course complicated by delirium secondary to encephalopathy, sepsis secondary to aspiration PNA, reintubated 2/19-2/26 for airway protection, bilat segmental PEs 2/18. PEG placed 3/6. PMHx includes tobacco and EtOH use disorder.   OT comments  Patient seated in recliner and agreeable to OT session.  Pt reports improved comfort with splint since modifications yesterday, also reports overall decreased pain in hand.  Transitioned focus from splint to ADL engagement, pt reports frustration with feeding due to decreased coordination.  Educated on using R hand as stabilizer, able to open grooming items with setup.  Increased time required to open R hand after holding items, completed reps of passing squeeze ball between hands to improve bilateral coordination and open/close of R hand.  Encouraged pt complete these exercises throughout the day using everyday items. Will follow acutely, with plans to focus on dressing next session.    Recommendations for follow up therapy are one component of a multi-disciplinary discharge planning process, led by the attending physician.  Recommendations may be updated based on patient status, additional functional criteria and insurance authorization.    Follow Up Recommendations  Skilled nursing-short term rehab (<3 hours/day)    Assistance Recommended at Discharge Intermittent Supervision/Assistance  Patient can return  home with the following  A little help with walking and/or transfers;A little help with bathing/dressing/bathroom;Assistance with cooking/housework;Assistance with feeding;Assist for transportation;Help with stairs or ramp for entrance   Equipment Recommendations  Wheelchair (measurements OT);Wheelchair cushion (measurements OT);Hospital bed;BSC/3in1;Tub/shower seat    Recommendations for Other Services      Precautions / Restrictions Precautions Precautions: Fall Precaution Booklet Issued: No Precaution Comments: previous G-tube site (pt pulled it out on evening of 4/29) Required Braces or Orthoses: Other Brace (resting hand splint R UE) Cervical Brace:  (dc'd) Other Brace: resting hand splint Restrictions Weight Bearing Restrictions: No       Mobility Bed Mobility               General bed mobility comments: OOB in recliner    Transfers                         Balance                                           ADL either performed or assessed with clinical judgement   ADL   Eating/Feeding: Set up;Sitting Eating/Feeding Details (indicate cue type and reason): pt reports frustration with self feeding, decreased coordination.  pt reports unable to use red foam handle with R hand due to fork sliding around. Grooming: Set up;Sitting Grooming Details (indicate cue type and reason): using R hand as stabilizer to assist with bilateral coordination when opening containers                               General ADL Comments: in  recliner upon entry    Extremity/Trunk Assessment              Vision       Perception     Praxis      Cognition Arousal/Alertness: Awake/alert Behavior During Therapy: WFL for tasks assessed/performed Overall Cognitive Status: Within Functional Limits for tasks assessed                                          Exercises Exercises: Other exercises Other Exercises Other  Exercises: x 5 passing ball from R to L hand given increased time to open/close hands    Shoulder Instructions       General Comments pt reports pain in R UE decreased from yesterday, reprots no edema or indentations when wearing splint    Pertinent Vitals/ Pain       Pain Assessment Pain Assessment: Faces Faces Pain Scale: Hurts a little bit Pain Location: R hand Pain Descriptors / Indicators: Discomfort Pain Intervention(s): Monitored during session  Home Living                                          Prior Functioning/Environment              Frequency  Min 2X/week        Progress Toward Goals  OT Goals(current goals can now be found in the care plan section)  Progress towards OT goals: Progressing toward goals  Acute Rehab OT Goals Patient Stated Goal: use my right hand OT Goal Formulation: With patient Time For Goal Achievement: 11/06/21 Potential to Achieve Goals: Good  Plan Discharge plan remains appropriate;Frequency remains appropriate    Co-evaluation                 AM-PAC OT "6 Clicks" Daily Activity     Outcome Measure   Help from another person eating meals?: A Little Help from another person taking care of personal grooming?: A Little Help from another person toileting, which includes using toliet, bedpan, or urinal?: A Lot Help from another person bathing (including washing, rinsing, drying)?: A Lot Help from another person to put on and taking off regular upper body clothing?: A Little Help from another person to put on and taking off regular lower body clothing?: A Lot 6 Click Score: 15    End of Session    OT Visit Diagnosis: Other abnormalities of gait and mobility (R26.89);Muscle weakness (generalized) (M62.81);Feeding difficulties (R63.3);Other symptoms and signs involving the nervous system (R29.898);Other symptoms and signs involving cognitive function Pain - Right/Left: Right Pain - part of body: Hand    Activity Tolerance Patient tolerated treatment well   Patient Left in chair;with call bell/phone within reach;with chair alarm set   Nurse Communication Mobility status        Time: 1310-1345 OT Time Calculation (min): 35 min  Charges: OT General Charges $OT Visit: 1 Visit OT Treatments $Self Care/Home Management : 8-22 mins $Neuromuscular Re-education: 8-22 mins  Jolaine Artist, [place] Office [phone]   Delight Stare 10/24/2021, 1:53 PM

## 2021-10-25 LAB — CBC
HCT: 32.3 % — ABNORMAL LOW (ref 39.0–52.0)
Hemoglobin: 10.5 g/dL — ABNORMAL LOW (ref 13.0–17.0)
MCH: 30 pg (ref 26.0–34.0)
MCHC: 32.5 g/dL (ref 30.0–36.0)
MCV: 92.3 fL (ref 80.0–100.0)
Platelets: 253 10*3/uL (ref 150–400)
RBC: 3.5 MIL/uL — ABNORMAL LOW (ref 4.22–5.81)
RDW: 15.2 % (ref 11.5–15.5)
WBC: 4.8 10*3/uL (ref 4.0–10.5)
nRBC: 0 % (ref 0.0–0.2)

## 2021-10-25 LAB — COMPREHENSIVE METABOLIC PANEL
ALT: 19 U/L (ref 0–44)
AST: 27 U/L (ref 15–41)
Albumin: 3.5 g/dL (ref 3.5–5.0)
Alkaline Phosphatase: 111 U/L (ref 38–126)
Anion gap: 6 (ref 5–15)
BUN: 18 mg/dL (ref 6–20)
CO2: 27 mmol/L (ref 22–32)
Calcium: 10.1 mg/dL (ref 8.9–10.3)
Chloride: 107 mmol/L (ref 98–111)
Creatinine, Ser: 0.98 mg/dL (ref 0.61–1.24)
GFR, Estimated: >60 mL/min (ref >60)
Glucose, Bld: 115 mg/dL — ABNORMAL HIGH (ref 70–99)
Potassium: 3.8 mmol/L (ref 3.5–5.1)
Sodium: 140 mmol/L (ref 135–145)
Total Bilirubin: 0.3 mg/dL (ref 0.3–1.2)
Total Protein: 7.7 g/dL (ref 6.5–8.1)

## 2021-10-25 LAB — MAGNESIUM: Magnesium: 1.9 mg/dL (ref 1.7–2.4)

## 2021-10-25 NOTE — Plan of Care (Signed)
Pt doing well. Pt walked multiple times today. No concerns.   Problem: Education: Goal: Ability to verbalize activity precautions or restrictions will improve Outcome: Progressing Goal: Knowledge of the prescribed therapeutic regimen will improve Outcome: Progressing Goal: Understanding of discharge needs will improve Outcome: Progressing   Problem: Activity: Goal: Ability to avoid complications of mobility impairment will improve Outcome: Progressing Goal: Ability to tolerate increased activity will improve Outcome: Progressing Goal: Will remain free from falls Outcome: Progressing   Problem: Bowel/Gastric: Goal: Gastrointestinal status for postoperative course will improve Outcome: Progressing   Problem: Clinical Measurements: Goal: Ability to maintain clinical measurements within normal limits will improve Outcome: Progressing Goal: Postoperative complications will be avoided or minimized Outcome: Progressing Goal: Diagnostic test results will improve Outcome: Progressing   Problem: Pain Management: Goal: Pain level will decrease Outcome: Progressing   Problem: Skin Integrity: Goal: Will show signs of wound healing Outcome: Progressing   Problem: Health Behavior/Discharge Planning: Goal: Identification of resources available to assist in meeting health care needs will improve Outcome: Progressing   Problem: Bladder/Genitourinary: Goal: Urinary functional status for postoperative course will improve Outcome: Progressing   Problem: Safety: Goal: Ability to remain free from injury will improve Outcome: Progressing   Problem: Education: Goal: Knowledge of General Education information will improve Description: Including pain rating scale, medication(s)/side effects and non-pharmacologic comfort measures Outcome: Progressing   Problem: Health Behavior/Discharge Planning: Goal: Ability to manage health-related needs will improve Outcome: Progressing   Problem:  Clinical Measurements: Goal: Ability to maintain clinical measurements within normal limits will improve Outcome: Progressing Goal: Will remain free from infection Outcome: Progressing Goal: Diagnostic test results will improve Outcome: Progressing Goal: Respiratory complications will improve Outcome: Progressing Goal: Cardiovascular complication will be avoided Outcome: Progressing   Problem: Activity: Goal: Risk for activity intolerance will decrease Outcome: Progressing   Problem: Nutrition: Goal: Adequate nutrition will be maintained Outcome: Progressing   Problem: Coping: Goal: Level of anxiety will decrease Outcome: Progressing   Problem: Elimination: Goal: Will not experience complications related to bowel motility Outcome: Progressing Goal: Will not experience complications related to urinary retention Outcome: Progressing   Problem: Pain Managment: Goal: General experience of comfort will improve Outcome: Progressing   Problem: Safety: Goal: Ability to remain free from injury will improve Outcome: Progressing   Problem: Skin Integrity: Goal: Risk for impaired skin integrity will decrease Outcome: Progressing

## 2021-10-25 NOTE — Progress Notes (Signed)
   Providing Compassionate, Quality Care - Together   Subjective: Patient reports no issues overnight. Right hand feels better when wearing the brace.  Objective: Vital signs in last 24 hours: Temp:  [97.6 F (36.4 C)-98.4 F (36.9 C)] 97.7 F (36.5 C) (06/01 1653) Pulse Rate:  [87-96] 88 (06/01 1653) Resp:  [16-18] 16 (06/01 1653) BP: (98-111)/(72-81) 111/81 (06/01 1653) SpO2:  [100 %] 100 % (06/01 1653)  Intake/Output from previous day: 05/31 0701 - 06/01 0700 In: 480 [P.O.:480] Out: 350 [Urine:350] Intake/Output this shift: Total I/O In: 240 [P.O.:240] Out: -   Sitting up in chair Alert; oriented to person, place, and time MAE, Generalized weakness Decreased fine motor BUE Right hand in splint Incision is well-healed  Lab Results: Recent Labs    10/25/21 0324  WBC 4.8  HGB 10.5*  HCT 32.3*  PLT 253   BMET Recent Labs    10/25/21 0324  NA 140  K 3.8  CL 107  CO2 27  GLUCOSE 115*  BUN 18  CREATININE 0.98  CALCIUM 10.1    Studies/Results: No results found.  Assessment/Plan: Patient is doing well after a very complicated hospital course following four level ACDF. He would like to discharge home. Continue efforts at mobilization.    LOS: 118 days     Val Eagle, DNP, AGNP-C Nurse Practitioner  [facility] [address], [place], [place] [address] P: [phone]    F: [phone]  10/25/2021, 5:36 PM

## 2021-10-25 NOTE — Progress Notes (Signed)
Physical Therapy Treatment Patient Details Name: Brian [name] MRN: [id number] DOB: [birth year] Today's Date: 10/25/2021   History of Present Illness Pt is a 61 y/o male admitted 06/29/21 following C3-7 ACDF after progressive cervical myelopathy symptoms including ataxia, bil UE and LE shaking and weakness, and loss of function in his hands with subsequent fall. Pt initially had improvement in neurological symptoms, but postoperative course was complicated by dysphasia and ataxia secondary to and epidural hematoma with evacuation of hematoma on 2/7. Course complicated by delirium secondary to encephalopathy, sepsis secondary to aspiration PNA, reintubated 2/19-2/26 for airway protection, bilat segmental PEs 2/18. PEG placed 3/6. PMHx includes tobacco and EtOH use disorder.    PT Comments    Pt progressing steadily.  Was flat and appeared down today.  Emphasis on safe technique and close guarding only for sit to stands and progression of gait distance, stability and overall quality--posture vs controlled heel/toe pattern.    Recommendations for follow up therapy are one component of a multi-disciplinary discharge planning process, led by the attending physician.  Recommendations may be updated based on patient status, additional functional criteria and insurance authorization.  Follow Up Recommendations  Acute inpatient rehab (3hours/day)     Assistance Recommended at Discharge Intermittent Supervision/Assistance  Patient can return home with the following A little help with walking and/or transfers;A little help with bathing/dressing/bathroom;Assistance with cooking/housework;Assist for transportation;Help with stairs or ramp for entrance   Equipment Recommendations  Rolling walker (2 wheels);Wheelchair (measurements PT);Wheelchair cushion (measurements PT)    Recommendations for Other Services       Precautions / Restrictions Precautions Precautions: Fall Other Brace: resting hand  splint     Mobility  Bed Mobility               General bed mobility comments: OOB in recliner    Transfers Overall transfer level: Needs assistance Equipment used: Rolling walker (2 wheels) Transfers: Sit to/from Stand Sit to Stand: Min guard Stand pivot transfers: Min guard              Ambulation/Gait Ambulation/Gait assistance: Min guard Gait Distance (Feet): 250 Feet Assistive device: Rolling walker (2 wheels) Gait Pattern/deviations: Step-through pattern   Gait velocity interpretation: <1.8 ft/sec, indicate of risk for recurrent falls   General Gait Details: continues to be ataxic, but with enough control to warrant guard assist.  Improved turns and improved stamina overall.   Stairs             Wheelchair Mobility    Modified Rankin (Stroke Patients Only)       Balance     Sitting balance-Leahy Scale: Fair       Standing balance-Leahy Scale: Fair                              Cognition Arousal/Alertness: Awake/alert Behavior During Therapy: WFL for tasks assessed/performed Overall Cognitive Status: Within Functional Limits for tasks assessed                                          Exercises Other Exercises Other Exercises: 20 reps of squats holding to the hall rail    General Comments        Pertinent Vitals/Pain Pain Assessment Pain Assessment: 0-10 Pain Score: 8  Pain Location: R > L hand Pain Descriptors / Indicators: Burning, Discomfort Pain  Intervention(s): Monitored during session    Home Living                          Prior Function            PT Goals (current goals can now be found in the care plan section) Acute Rehab PT Goals Patient Stated Goal: home PT Goal Formulation: With patient Time For Goal Achievement: 10/29/21 Potential to Achieve Goals: Good Progress towards PT goals: Progressing toward goals    Frequency    Min 3X/week      PT Plan  Current plan remains appropriate;Frequency needs to be updated    Co-evaluation              AM-PAC PT "6 Clicks" Mobility   Outcome Measure  Help needed turning from your back to your side while in a flat bed without using bedrails?: None Help needed moving from lying on your back to sitting on the side of a flat bed without using bedrails?: None Help needed moving to and from a bed to a chair (including a wheelchair)?: A Little Help needed standing up from a chair using your arms (e.g., wheelchair or bedside chair)?: A Little Help needed to walk in hospital room?: A Little Help needed climbing 3-5 steps with a railing? : A Little 6 Click Score: 20    End of Session   Activity Tolerance: Patient tolerated treatment well Patient left: in chair;with call bell/phone within reach;with chair alarm set Nurse Communication: Mobility status;Other (comment) PT Visit Diagnosis: Other abnormalities of gait and mobility (R26.89);Other symptoms and signs involving the nervous system (R29.898)     Time: [id number] PT Time Calculation (min) (ACUTE ONLY): 25 min  Charges:  $Gait Training: 8-22 mins $Therapeutic Exercise: 8-22 mins                     10/25/2021  Ginger Carne., PT Acute Rehabilitation Services [phone]  (pager) [phone]  (office)   [name] Matayah Reyburn 10/25/2021, 5:51 PM

## 2021-10-25 NOTE — Progress Notes (Signed)
PROGRESS NOTE    Brian [name]  NZV:[id number] DOB: 1961/01/18 DOA: 06/29/2021 PCP: [name], No  Primary service: Neurosurgery  Brief Narrative: Brian [name] is a 61 year old male with past medical history of tobacco and alcohol use was admitted by neurosurgery for cervical myelopathy due to critical multilevel cervical spinal canal stenosis at C3-C6 with spinal cord compression.  TRH on board for medical assistance.Awaiting placement/improvement with mobility to be discharged home.  Assessment and Plan:  Principal Problem:   Cervical myelopathy (HCC); severe cervical spinal stenosis with cord compression s/p ACDF C3-7 on 2/3, complicated by epidural hematoma s/p evacuation 2/7 Active Problems:   Acute respiratory failure with hypoxia (HCC)   Acute pulmonary embolism (HCC)   Acute metabolic encephalopathy   Severe sepsis due to recurrent aspiration pneumonia   E. coli bacteremia   Sinus tachycardia   Elevated liver enzymes   Hyponatremia   Tobacco use disorder   Physical deconditioning   Urinary retention   Protein-calorie malnutrition, severe (HCC)   Lumbar back pain   Fall during current hospitalization   Normocytic anemia   Thrombocytosis   Citrobacter infection   Vomiting   Abnormal LFTs   Aspiration pneumonia (HCC)   Cervical myelopathy (HCC); severe cervical spinal stenosis with cord compression s/p ACDF C3-7 on 2/3, complicated by epidural hematoma s/p evacuation 2/7 Currently awaiting for skilled nursing facility placement.  Right hand pain  Continue gabapentin and brace. Improved.  Acute respiratory failure with hypoxia (HCC) Had recurrent aspiration pneumonia during hospitalization.  Was intubated and successfully extubated during hospitalization.  Has completed course of antibiotic for aspiration pneumonia.  Currently on room air.  Appears stable at this time.  Acute pulmonary embolism (HCC) CTA chest (2/7) showed bilateral segmental PE. LE [place] negative  for DVT.  TTE with normal LV systolic function.  Continue Eliquis.  Acute metabolic encephalopathy CT head, MRI brain, EEG, TSH, B12, ammonia was unremarkable.   E. coli bacteremia Urine culture and blood cultures x2 08/17/2021 positive for E. coli. Completed 7-day course of Ceftriaxone > Cefazolin.  Severe sepsis due to recurrent aspiration pneumonia Completed course of Zosyn  Sinus tachycardia, resolved To be multifactorial secondary to dehydration sepsis pulmonary embolism.  On metoprolol.  Elevated liver enzymes Hepatitis panel was negative.  Right upper quadrant ultrasound was unremarkable, has resolved at this time.  Hyponatremia Resolved.  Latest sodium of 140.  Tobacco use disorder Off nicotine patch.  Physical deconditioning Significant weakness in all extremities as a result of cervical myelopathy and acute illness.  Continue physical therapy occupational therapy.  Plan for CIR.  Urinary retention Foley catheter discontinued on 08/09/2021; replaced on 09/09/2021 for recurrent urinary retention and removed again on 4/25.  Continue Flomax and finasteride.    Severe protein-calorie malnutrition, Continue with dietary supplements.  Thrombocytosis Has resolved.  Latest platelet count of 253  Normocytic anemia Chronic and stable.  No recent CBC but did require 1 PRBC during hospitalization  Hyperglycemia-resolved as of 07/12/2021. Likely due to Steroids. Hemoglobin A1C of 5.2%. Resolved.   DVT prophylaxis: Eliquis  Code Status:   Code Status: Full Code  Family Communication:  Communicated with the patient at bedside.  Disposition Plan:  Medically stable for disposition.  Awaiting for disposition.  Consultants:  PCCM Infectious disease Neurology Palliative care General medicine  Procedures:  ACDF C3-7, neurosurgery Dr. Dutch Quint 2/3 Hematoma evacuation 2/7 Vascular duplex ultrasound bilateral lower extremities 2/19 TTE 2/20 TTE 3/1 IR G-tube placement 3/6,  3/24  Antimicrobials: Vancomycin 2/7 -  2/8; 2/18 - 2/21 Zosyn 2/21 -2/23 Ampicillin 2/7 -2/11 Metronidazole 2/7 - 2/8; 2/17 -2/18 Cefepime 2/18 - 2/21 Unasyn 3/24 - 3/24 Perioperative cefazolin Ceftriaxone 3/25 - 3/27 Cefazolin 3/27 - 4/1   Subjective: Today, patient was seen and examined at bedside.  Denies interval complaints.  Pain in the hand is little better.  Objective:    10/25/2021    9:07 AM 10/25/2021    2:00 AM 10/24/2021    8:14 PM  Vitals with BMI  Systolic 98 98 104  Diastolic 79 73 75  Pulse 96 87 87    Physical examination: Body mass index is 16.83 kg/m.   General: Thinly built, not in obvious distress HENT:   No scleral pallor or icterus noted. Oral mucosa is moist.  Chest:  Clear breath sounds.  Diminished breath sounds bilaterally. No crackles or wheezes.  CVS: S1 &S2 heard. No murmur.  Regular rate and rhythm. Abdomen: Soft, nontender, nondistended.  Bowel sounds are heard.   Extremities: No cyanosis, clubbing or edema.  Peripheral pulses are palpable.  Generalized weakness noted Psych: Alert, awake and oriented, normal mood CNS:  No cranial nerve deficits.  Generalized weakness noted Skin: Warm and dry.  No rashes noted.   Data Reviewed: I have personally reviewed the following labs and imaging studies   CBC Lab Results  Component Value Date   WBC 4.8 10/25/2021   RBC 3.50 (L) 10/25/2021   HGB 10.5 (L) 10/25/2021   HCT 32.3 (L) 10/25/2021   MCV 92.3 10/25/2021   MCH 30.0 10/25/2021   PLT 253 10/25/2021   MCHC 32.5 10/25/2021   RDW 15.2 10/25/2021   LYMPHSABS 2.5 10/11/2021   MONOABS 0.5 10/11/2021   EOSABS 0.1 10/11/2021   BASOSABS 0.0 10/11/2021     Last metabolic panel Lab Results  Component Value Date   NA 140 10/25/2021   K 3.8 10/25/2021   CL 107 10/25/2021   CO2 27 10/25/2021   BUN 18 10/25/2021   CREATININE 0.98 10/25/2021   GLUCOSE 115 (H) 10/25/2021   GFRNONAA >60 10/25/2021   CALCIUM 10.1 10/25/2021   PHOS 4.5  09/24/2021   PROT 7.7 10/25/2021   ALBUMIN 3.5 10/25/2021   BILITOT 0.3 10/25/2021   ALKPHOS 111 10/25/2021   AST 27 10/25/2021   ALT 19 10/25/2021   ANIONGAP 6 10/25/2021    GFR: Estimated Creatinine Clearance: 60.3 mL/min (by C-G formula based on SCr of 0.98 mg/dL).  No results found for this or any previous visit (from the past 240 hour(s)).    Radiology Studies: No results found.    LOS: 118 days    Loistine Chance, MD [facility] 10/25/2021, 11:44 AM  If 7PM-7AM, please contact night-coverage [email or web address]

## 2021-10-26 NOTE — Plan of Care (Signed)
  Problem: Education: Goal: Ability to verbalize activity precautions or restrictions will improve Outcome: Progressing Goal: Knowledge of the prescribed therapeutic regimen will improve Outcome: Progressing Goal: Understanding of discharge needs will improve Outcome: Progressing   Problem: Education: Goal: Ability to verbalize activity precautions or restrictions will improve Outcome: Progressing   Problem: Education: Goal: Knowledge of the prescribed therapeutic regimen will improve Outcome: Progressing   Problem: Education: Goal: Understanding of discharge needs will improve Outcome: Progressing

## 2021-10-26 NOTE — Progress Notes (Signed)
Clinical  PROGRESS NOTE    Brian [name]  GEZ:[id number] DOB: Jan 30, 1961 DOA: 06/29/2021 PCP: Pcp, No  Primary service: Neurosurgery  Brief Narrative: Brian [name] is a 61 year old male with past medical history of tobacco and alcohol use was admitted by neurosurgery for cervical myelopathy due to critical multilevel cervical spinal canal stenosis at C3-C6 with spinal cord compression.  TRH on board for medical assistance.Awaiting placement/improvement with mobility to be discharged home.  Assessment and Plan:  Principal Problem:   Cervical myelopathy (HCC); severe cervical spinal stenosis with cord compression s/p ACDF C3-7 on 2/3, complicated by epidural hematoma s/p evacuation 2/7 Active Problems:   Acute respiratory failure with hypoxia (HCC)   Acute pulmonary embolism (HCC)   Acute metabolic encephalopathy   Severe sepsis due to recurrent aspiration pneumonia   E. coli bacteremia   Sinus tachycardia   Elevated liver enzymes   Hyponatremia   Tobacco use disorder   Physical deconditioning   Urinary retention   Protein-calorie malnutrition, severe (HCC)   Lumbar back pain   Fall during current hospitalization   Normocytic anemia   Thrombocytosis   Citrobacter infection   Vomiting   Abnormal LFTs   Aspiration pneumonia (HCC)   Cervical myelopathy (HCC); severe cervical spinal stenosis with cord compression s/p ACDF C3-7 on 2/3, complicated by epidural hematoma s/p evacuation 2/7 Currently awaiting for skilled nursing facility placement.  Right hand pain  Continue gabapentin and brace. Improved.  Acute respiratory failure with hypoxia (HCC) Had recurrent aspiration pneumonia during hospitalization.  Was intubated and successfully extubated during hospitalization.  Has completed course of antibiotic for aspiration pneumonia.  Currently on room air.  Appears stable at this time.  Acute pulmonary embolism (HCC) CTA chest (2/7) showed bilateral segmental PE. LE [place]  negative for DVT.  TTE with normal LV systolic function.  Continue Eliquis.  Acute metabolic encephalopathy CT head, MRI brain, EEG, TSH, B12, ammonia was unremarkable.   E. coli bacteremia Urine culture and blood cultures x2 08/17/2021 positive for E. coli. Completed 7-day course of Ceftriaxone > Cefazolin.  Severe sepsis due to recurrent aspiration pneumonia Completed course of Zosyn  Sinus tachycardia, resolved To be multifactorial secondary to dehydration sepsis pulmonary embolism.  On metoprolol.  Elevated liver enzymes Hepatitis panel was negative.  Right upper quadrant ultrasound was unremarkable, has resolved at this time.  Hyponatremia Resolved.  Latest sodium of 140.  Tobacco use disorder Off nicotine patch.  Physical deconditioning Significant weakness in all extremities as a result of cervical myelopathy and acute illness.  Continue physical therapy occupational therapy.  Plan for skilled nursing facility placement.  Urinary retention Foley catheter discontinued on 08/09/2021; replaced on 09/09/2021 for recurrent urinary retention and removed again on 4/25.  Continue Flomax and finasteride.    Severe protein-calorie malnutrition, Continue with dietary supplements.  Thrombocytosis Has resolved.  Latest platelet count of 253  Normocytic anemia Chronic and stable.  No recent CBC but did require 1 PRBC during hospitalization  Hyperglycemia-resolved as of 07/12/2021. Likely due to Steroids. Hemoglobin A1C of 5.2%. Resolved.   DVT prophylaxis: Eliquis  Code Status:   Code Status: Full Code  Family Communication:  Communicated with the patient at bedside.  Disposition Plan:  Medically stable for disposition.  Awaiting for disposition.  Consultants:  PCCM Infectious disease Neurology Palliative care General medicine  Procedures:  ACDF C3-7, neurosurgery Dr. Dutch Quint 2/3 Hematoma evacuation 2/7 Vascular duplex ultrasound bilateral lower extremities 2/19 TTE  2/20 TTE 3/1 IR G-tube placement 3/6, 3/24  Antimicrobials: Vancomycin 2/7 - 2/8; 2/18 - 2/21 Zosyn 2/21 -2/23 Ampicillin 2/7 -2/11 Metronidazole 2/7 - 2/8; 2/17 -2/18 Cefepime 2/18 - 2/21 Unasyn 3/24 - 3/24 Perioperative cefazolin Ceftriaxone 3/25 - 3/27 Cefazolin 3/27 - 4/1   Subjective: Today, patient was seen and examined at bedside.  Complains of better with his hand.  No fever chills shortness of breath chest pain  Objective:    10/26/2021    8:01 AM 10/26/2021    5:00 AM 10/26/2021    4:23 AM  Vitals with BMI  Weight  114 lbs 10 oz   Systolic 118  118  Diastolic 73  82  Pulse 97  89    Physical examination: Body mass index is 16.45 kg/m.   General: Thinly built, not in obvious distress HENT:   No scleral pallor or icterus noted. Oral mucosa is moist.  Chest:  Clear breath sounds.  Diminished breath sounds bilaterally. No crackles or wheezes.  CVS: S1 &S2 heard. No murmur.  Regular rate and rhythm. Abdomen: Soft, nontender, nondistended.  Bowel sounds are heard.   Extremities: No cyanosis, clubbing or edema.  Peripheral pulses are palpable. Psych: Alert, awake and oriented, normal mood CNS:  No cranial nerve deficits.  Generalized weakness noted Skin: Warm and dry.  No rashes noted.   Data Reviewed: I have personally reviewed the following labs and imaging studies   CBC Lab Results  Component Value Date   WBC 4.8 10/25/2021   RBC 3.50 (L) 10/25/2021   HGB 10.5 (L) 10/25/2021   HCT 32.3 (L) 10/25/2021   MCV 92.3 10/25/2021   MCH 30.0 10/25/2021   PLT 253 10/25/2021   MCHC 32.5 10/25/2021   RDW 15.2 10/25/2021   LYMPHSABS 2.5 10/11/2021   MONOABS 0.5 10/11/2021   EOSABS 0.1 10/11/2021   BASOSABS 0.0 10/11/2021     Last metabolic panel Lab Results  Component Value Date   NA 140 10/25/2021   K 3.8 10/25/2021   CL 107 10/25/2021   CO2 27 10/25/2021   BUN 18 10/25/2021   CREATININE 0.98 10/25/2021   GLUCOSE 115 (H) 10/25/2021   GFRNONAA >60  10/25/2021   CALCIUM 10.1 10/25/2021   PHOS 4.5 09/24/2021   PROT 7.7 10/25/2021   ALBUMIN 3.5 10/25/2021   BILITOT 0.3 10/25/2021   ALKPHOS 111 10/25/2021   AST 27 10/25/2021   ALT 19 10/25/2021   ANIONGAP 6 10/25/2021    GFR: Estimated Creatinine Clearance: 59 mL/min (by C-G formula based on SCr of 0.98 mg/dL).  No results found for this or any previous visit (from the past 240 hour(s)).    Radiology Studies: No results found.    LOS: 119 days    Loistine Chance, MD [facility] 10/26/2021, 11:33 AM  If 7PM-7AM, please contact night-coverage [email or web address]

## 2021-10-26 NOTE — Progress Notes (Signed)
   Providing Compassionate, Quality Care - Together   Subjective: Patient reports his hands feel somewhat better. Still with pain and decreased coordination R>L.  Objective: Vital signs in last 24 hours: Temp:  [97.7 F (36.5 C)-98.6 F (37 C)] 98.6 F (37 C) (06/02 1208) Pulse Rate:  [78-97] 78 (06/02 1208) Resp:  [16] 16 (06/02 1208) BP: (106-121)/(73-84) 106/78 (06/02 1208) SpO2:  [99 %-100 %] 100 % (06/02 1208) Weight:  [52 kg] 52 kg (06/02 0500)  Intake/Output from previous day: 06/01 0701 - 06/02 0700 In: 360 [P.O.:360] Out: 600 [Urine:600] Intake/Output this shift: Total I/O In: -  Out: 100 [Urine:100]    Sitting up in chair Alert; oriented to person, place, and time MAE, Generalized weakness Decreased fine motor BUE Incision is well-healed  Lab Results: Recent Labs    10/25/21 0324  WBC 4.8  HGB 10.5*  HCT 32.3*  PLT 253   BMET Recent Labs    10/25/21 0324  NA 140  K 3.8  CL 107  CO2 27  GLUCOSE 115*  BUN 18  CREATININE 0.98  CALCIUM 10.1    Studies/Results: No results found.  Assessment/Plan: Patient is doing well after a very complicated hospital course following four level ACDF. He would like to discharge home. Continue efforts at mobilization.   LOS: 119 days     Viona Gilmore, DNP, AGNP-C Nurse Practitioner  [facility] [address], [place], [place] [address] P: [phone]    F: [phone]  10/26/2021, 1:08 PM

## 2021-10-27 NOTE — Progress Notes (Signed)
NEUROSURGERY PROGRESS NOTE  Doing well. No acute events overnight.   Temp:  [98.2 F (36.8 C)-98.7 F (37.1 C)] 98.2 F (36.8 C) (06/03 0751) Pulse Rate:  [78-96] 96 (06/03 0751) Resp:  [16-20] 19 (06/03 0751) BP: (106-129)/(78-93) 116/82 (06/03 0751) SpO2:  [99 %-100 %] 100 % (06/03 0751) Weight:  [53.7 kg] 53.7 kg (06/03 0615)  Plan: Continue to mobilize  Eleonore Chiquito, NP 10/27/2021 8:29 AM

## 2021-10-27 NOTE — Progress Notes (Signed)
Clinical  PROGRESS NOTE    NICHOLA [name]  TMA:[id number] DOB: Oct 10, 1960 DOA: 06/29/2021 PCP: Pcp, No  Primary service: Neurosurgery  Brief Narrative: Brian [name] is a 61 year old male with past medical history of tobacco and alcohol use was admitted by neurosurgery for cervical myelopathy due to critical multilevel cervical spinal canal stenosis at C3-C6 with spinal cord compression.  TRH on board for medical assistance.Awaiting placement/improvement with mobility to be discharged home.  Assessment and Plan:  Principal Problem:   Cervical myelopathy (HCC); severe cervical spinal stenosis with cord compression s/p ACDF C3-7 on 2/3, complicated by epidural hematoma s/p evacuation 2/7 Active Problems:   Acute respiratory failure with hypoxia (HCC)   Acute pulmonary embolism (HCC)   Acute metabolic encephalopathy   Severe sepsis due to recurrent aspiration pneumonia   E. coli bacteremia   Sinus tachycardia   Elevated liver enzymes   Hyponatremia   Tobacco use disorder   Physical deconditioning   Urinary retention   Protein-calorie malnutrition, severe (HCC)   Lumbar back pain   Fall during current hospitalization   Normocytic anemia   Thrombocytosis   Citrobacter infection   Vomiting   Abnormal LFTs   Aspiration pneumonia (HCC)   Cervical myelopathy (HCC); severe cervical spinal stenosis with cord compression s/p ACDF C3-7 on 2/3, complicated by epidural hematoma s/p evacuation 2/7 Currently awaiting for skilled nursing facility placement.  Right hand pain  Continue gabapentin and brace. Improved.  Acute respiratory failure with hypoxia (HCC) Had recurrent aspiration pneumonia during hospitalization.  Was intubated and successfully extubated during hospitalization.  Has completed course of antibiotic for aspiration pneumonia.  Currently on room air.  Appears stable at this time.  Acute pulmonary embolism (HCC) CTA chest (2/7) showed bilateral segmental PE. LE [place]  negative for DVT.  TTE with normal LV systolic function.  Continue Eliquis.  Acute metabolic encephalopathy CT head, MRI brain, EEG, TSH, B12, ammonia was unremarkable.   E. coli bacteremia Urine culture and blood cultures x2 08/17/2021 positive for E. coli. Completed 7-day course of Ceftriaxone > Cefazolin.  Severe sepsis due to recurrent aspiration pneumonia Completed course of Zosyn  Sinus tachycardia, resolved To be multifactorial secondary to dehydration sepsis pulmonary embolism.  On metoprolol.  Elevated liver enzymes Hepatitis panel was negative.  Right upper quadrant ultrasound was unremarkable, has resolved at this time.  Hyponatremia Resolved.  Latest sodium of 140.  Tobacco use disorder Off nicotine patch.  Physical deconditioning Significant weakness in all extremities as a result of cervical myelopathy and acute illness.  Continue physical therapy occupational therapy.  Plan for skilled nursing facility placement.  Urinary retention Foley catheter discontinued on 08/09/2021; replaced on 09/09/2021 for recurrent urinary retention and removed again on 4/25.  Continue Flomax and finasteride.    Severe protein-calorie malnutrition, Continue with dietary supplements.  Thrombocytosis Has resolved.  Latest platelet count of 253  Normocytic anemia Chronic and stable.  No recent CBC but did require 1 PRBC during hospitalization  Hyperglycemia-resolved as of 07/12/2021. Likely due to Steroids. Hemoglobin A1C of 5.2%. Resolved.   DVT prophylaxis: Eliquis  Code Status:   Code Status: Full Code  Family Communication:  Communicated with the patient at bedside.  Disposition Plan:  Medically stable for disposition.  Awaiting for disposition.  Consultants:  PCCM Infectious disease Neurology Palliative care General medicine  Procedures:  ACDF C3-7, neurosurgery Dr. Dutch Quint 2/3 Hematoma evacuation 2/7 Vascular duplex ultrasound bilateral lower extremities 2/19 TTE  2/20 TTE 3/1 IR G-tube placement 3/6, 3/24  Antimicrobials: Vancomycin 2/7 - 2/8; 2/18 - 2/21 Zosyn 2/21 -2/23 Ampicillin 2/7 -2/11 Metronidazole 2/7 - 2/8; 2/17 -2/18 Cefepime 2/18 - 2/21 Unasyn 3/24 - 3/24 Perioperative cefazolin Ceftriaxone 3/25 - 3/27 Cefazolin 3/27 - 4/1   Subjective: Today, patient was seen and examined at bedside.  Seen eating his breakfast.  Moving his right upper extremity is better.  Denies any nausea vomiting fever chills or rigor.   Objective:    10/27/2021    7:51 AM 10/27/2021    6:15 AM 10/27/2021    3:35 AM  Vitals with BMI  Weight  118 lbs 6 oz   Systolic 116  110  Diastolic 82  80  Pulse 96  84    Physical examination: Body mass index is 16.99 kg/m.   General: Thinly built, not in obvious distress HENT:   No scleral pallor or icterus noted. Oral mucosa is moist.  Chest:  Clear breath sounds.  Diminished breath sounds bilaterally. No crackles or wheezes.  CVS: S1 &S2 heard. No murmur.  Regular rate and rhythm. Abdomen: Soft, nontender, nondistended.  Bowel sounds are heard.   Extremities: No cyanosis, clubbing or edema.  Peripheral pulses are palpable. Psych: Alert, awake and oriented, normal mood CNS:  No cranial nerve deficits.  Generalized weakness noted.  Right upper extremity movement better. Skin: Warm and dry.  No rashes noted.   Data Reviewed: I have personally reviewed the following labs and imaging studies   CBC Lab Results  Component Value Date   WBC 4.8 10/25/2021   RBC 3.50 (L) 10/25/2021   HGB 10.5 (L) 10/25/2021   HCT 32.3 (L) 10/25/2021   MCV 92.3 10/25/2021   MCH 30.0 10/25/2021   PLT 253 10/25/2021   MCHC 32.5 10/25/2021   RDW 15.2 10/25/2021   LYMPHSABS 2.5 10/11/2021   MONOABS 0.5 10/11/2021   EOSABS 0.1 10/11/2021   BASOSABS 0.0 10/11/2021     Last metabolic panel Lab Results  Component Value Date   NA 140 10/25/2021   K 3.8 10/25/2021   CL 107 10/25/2021   CO2 27 10/25/2021   BUN 18 10/25/2021    CREATININE 0.98 10/25/2021   GLUCOSE 115 (H) 10/25/2021   GFRNONAA >60 10/25/2021   CALCIUM 10.1 10/25/2021   PHOS 4.5 09/24/2021   PROT 7.7 10/25/2021   ALBUMIN 3.5 10/25/2021   BILITOT 0.3 10/25/2021   ALKPHOS 111 10/25/2021   AST 27 10/25/2021   ALT 19 10/25/2021   ANIONGAP 6 10/25/2021    GFR: Estimated Creatinine Clearance: 60.9 mL/min (by C-G formula based on SCr of 0.98 mg/dL).  No results found for this or any previous visit (from the past 240 hour(s)).    Radiology Studies: No results found.    LOS: 120 days    Loistine Chance, MD [facility] 10/27/2021, 9:14 AM  If 7PM-7AM, please contact night-coverage [email or web address]

## 2021-10-28 NOTE — Progress Notes (Signed)
Clinical  PROGRESS NOTE    Brian [name]  [id number] DOB: [birth year] DOA: 06/29/2021 PCP: Pcp, No  Primary service: Neurosurgery  Brief Narrative: Brian [name] is a 61 year old male with past medical history of tobacco and alcohol use was admitted by neurosurgery for cervical myelopathy due to critical multilevel cervical spinal canal stenosis at C3-C6 with spinal cord compression.  TRH on board for medical assistance. Awaiting placement/improvement with mobility to be discharged home.  Assessment and Plan:  Principal Problem:   Cervical myelopathy ([place]); severe cervical spinal stenosis with cord compression s/p ACDF Q000111Q on 2/3, complicated by epidural hematoma s/p evacuation 2/7 Active Problems:   Acute respiratory failure with hypoxia (HCC)   Acute pulmonary embolism (HCC)   Acute metabolic encephalopathy   Severe sepsis due to recurrent aspiration pneumonia   E. coli bacteremia   Sinus tachycardia   Elevated liver enzymes   Hyponatremia   Tobacco use disorder   Physical deconditioning   Urinary retention   Protein-calorie malnutrition, severe (HCC)   Lumbar back pain   Fall during current hospitalization   Normocytic anemia   Thrombocytosis   Citrobacter infection   Vomiting   Abnormal LFTs   Aspiration pneumonia (HCC)   Cervical myelopathy ([place]); severe cervical spinal stenosis with cord compression s/p ACDF Q000111Q on 2/3, complicated by epidural hematoma s/p evacuation 2/7 Currently awaiting for skilled nursing facility placement.  Right hand pain  Continue gabapentin and brace. Improved.  Acute respiratory failure with hypoxia ([place]) Had recurrent aspiration pneumonia during hospitalization.  Was intubated and successfully extubated during hospitalization.  Has completed course of antibiotic for aspiration pneumonia.  Currently on room air.  Appears stable at this time.  Acute pulmonary embolism (HCC) CTA chest (2/7) showed bilateral segmental PE. LE [place]  negative for DVT.  TTE with normal LV systolic function.  Continue Eliquis.  Acute metabolic encephalopathy CT head, MRI brain, EEG, TSH, B12, ammonia was unremarkable.   E. coli bacteremia Urine culture and blood cultures x2 08/17/2021 positive for E. coli. Completed 7-day course of Ceftriaxone > Cefazolin.  Severe sepsis due to recurrent aspiration pneumonia Completed course of Zosyn  Sinus tachycardia, resolved To be multifactorial secondary to dehydration sepsis pulmonary embolism.  On metoprolol.  Elevated liver enzymes Hepatitis panel was negative.  Right upper quadrant ultrasound was unremarkable, has resolved at this time.  Hyponatremia Resolved.  Latest sodium of 140.  Tobacco use disorder Off nicotine patch.  Physical deconditioning Significant weakness in all extremities as a result of cervical myelopathy and acute illness.  Continue physical therapy occupational therapy.  Plan for skilled nursing facility placement.  Urinary retention Foley catheter discontinued on 08/09/2021; replaced on 09/09/2021 for recurrent urinary retention and removed again on 4/25.  Continue Flomax and finasteride.    Severe protein-calorie malnutrition, Continue with dietary supplements.  Thrombocytosis Has resolved.  Latest platelet count of 253  Normocytic anemia Chronic and stable.  No recent CBC but did require 1 PRBC during hospitalization  Hyperglycemia-resolved as of 07/12/2021. Likely due to Steroids. Hemoglobin A1C of 5.2%. Resolved.   DVT prophylaxis: Eliquis  Code Status:   Code Status: Full Code  Family Communication:  None  Disposition Plan:  Medically stable for disposition.  Awaiting for disposition.  Consultants:  PCCM Infectious disease Neurology Palliative care General medicine  Procedures:  ACDF C3-7, neurosurgery Dr. Trenton Gammon 2/3 Hematoma evacuation 2/7 Vascular duplex ultrasound bilateral lower extremities 2/19 TTE 2/20 TTE 3/1 IR G-tube placement  3/6, 3/24  Antimicrobials: Vancomycin 2/7 -  2/8; 2/18 - 2/21 Zosyn 2/21 -2/23 Ampicillin 2/7 -2/11 Metronidazole 2/7 - 2/8; 2/17 -2/18 Cefepime 2/18 - 2/21 Unasyn 3/24 - 3/24 Perioperative cefazolin Ceftriaxone 3/25 - 3/27 Cefazolin 3/27 - 4/1   Subjective: Today, patient was seen and examined at bedside.  Denies interval complaints.    Objective:    10/28/2021    4:29 AM 10/28/2021   12:00 AM 10/27/2021    8:00 PM  Vitals with BMI  Systolic [id number]  Diastolic 74 72 75  Pulse 88 80 78    Physical examination: Body mass index is 16.99 kg/m.   General: Thinly built, not in obvious distress HENT:   No scleral pallor or icterus noted. Oral mucosa is moist.  Chest:  Clear breath sounds.  Diminished breath sounds bilaterally. No crackles or wheezes.  CVS: S1 &S2 heard. No murmur.  Regular rate and rhythm. Abdomen: Soft, nontender, nondistended.  Bowel sounds are heard.   Extremities: No cyanosis, clubbing or edema.  Peripheral pulses are palpable. Psych: Alert, awake and oriented, normal mood CNS:  No cranial nerve deficits.  Generalized weakness.  Right upper extremity movement better. Skin: Warm and dry.  No rashes noted.   Data Reviewed: I have personally reviewed the following labs and imaging studies   CBC Lab Results  Component Value Date   WBC 4.8 10/25/2021   RBC 3.50 (L) 10/25/2021   HGB 10.5 (L) 10/25/2021   HCT 32.3 (L) 10/25/2021   MCV 92.3 10/25/2021   MCH 30.0 10/25/2021   PLT 253 10/25/2021   MCHC 32.5 10/25/2021   RDW 15.2 10/25/2021   LYMPHSABS 2.5 10/11/2021   MONOABS 0.5 10/11/2021   EOSABS 0.1 10/11/2021   BASOSABS 0.0 Q000111Q     Last metabolic panel Lab Results  Component Value Date   NA 140 10/25/2021   K 3.8 10/25/2021   CL 107 10/25/2021   CO2 27 10/25/2021   BUN 18 10/25/2021   CREATININE 0.98 10/25/2021   GLUCOSE 115 (H) 10/25/2021   GFRNONAA >60 10/25/2021   CALCIUM 10.1 10/25/2021   PHOS 4.5 09/24/2021   PROT 7.7  10/25/2021   ALBUMIN 3.5 10/25/2021   BILITOT 0.3 10/25/2021   ALKPHOS 111 10/25/2021   AST 27 10/25/2021   ALT 19 10/25/2021   ANIONGAP 6 10/25/2021    GFR: Estimated Creatinine Clearance: 60.9 mL/min (by C-G formula based on SCr of 0.98 mg/dL).  No results found for this or any previous visit (from the past 240 hour(s)).    Radiology Studies: No results found.    LOS: 121 days    Oscar La, MD [facility] 10/28/2021, 10:01 AM  If 7PM-7AM, please contact night-coverage [email or web address]

## 2021-10-28 NOTE — Progress Notes (Signed)
NEUROSURGERY PROGRESS NOTE  Doing well.  No acute events overnight.  No new neurosurgical recommendations.  Temp:  [97.7 F (36.5 C)-98.4 F (36.9 C)] 98 F (36.7 C) (06/04 0429) Pulse Rate:  [78-88] 88 (06/04 0429) Resp:  [16-20] 19 (06/04 0429) BP: (98-107)/(72-75) 106/74 (06/04 0429) SpO2:  [100 %] 100 % (06/04 0429)   Sherryl Manges, NP 10/28/2021 7:54 AM

## 2021-10-29 MED ORDER — BACLOFEN 10 MG PO TABS
25.0000 mg | ORAL_TABLET | Freq: Three times a day (TID) | ORAL | Status: DC
  Administered 2021-10-29 – 2021-11-02 (×11): 25 mg via ORAL
  Filled 2021-10-29 (×5): qty 3
  Filled 2021-10-29: qty 2.5
  Filled 2021-10-29 (×5): qty 3

## 2021-10-29 MED ORDER — GABAPENTIN 300 MG PO CAPS
300.0000 mg | ORAL_CAPSULE | Freq: Three times a day (TID) | ORAL | Status: DC
  Administered 2021-10-29 – 2021-11-02 (×13): 300 mg via ORAL
  Filled 2021-10-29 (×13): qty 1

## 2021-10-29 NOTE — Progress Notes (Signed)
Clinical  PROGRESS NOTE    Brian [name]  [id number] DOB: [birth year] DOA: 06/29/2021 PCP: Pcp, No  Primary service: Neurosurgery  Brief Narrative: Brian [name] is a 61 year old male with past medical history of tobacco and alcohol use was admitted by neurosurgery for cervical myelopathy due to critical multilevel cervical spinal canal stenosis at C3-C6 with spinal cord compression.  TRH on board for medical assistance. Awaiting placement/improvement with mobility to be discharged home.  Assessment and Plan:  Principal Problem:   Cervical myelopathy ([place]); severe cervical spinal stenosis with cord compression s/p ACDF Q000111Q on 2/3, complicated by epidural hematoma s/p evacuation 2/7 Active Problems:   Acute respiratory failure with hypoxia (HCC)   Acute pulmonary embolism (HCC)   Acute metabolic encephalopathy   Severe sepsis due to recurrent aspiration pneumonia   E. coli bacteremia   Sinus tachycardia   Elevated liver enzymes   Hyponatremia   Tobacco use disorder   Physical deconditioning   Urinary retention   Protein-calorie malnutrition, severe (HCC)   Lumbar back pain   Fall during current hospitalization   Normocytic anemia   Thrombocytosis   Citrobacter infection   Vomiting   Abnormal LFTs   Aspiration pneumonia (HCC)   Cervical myelopathy ([place]); severe cervical spinal stenosis with cord compression s/p ACDF Q000111Q on 2/3, complicated by epidural hematoma s/p evacuation 2/7 Currently awaiting for skilled nursing facility placement.  Right hand pain  Continue gabapentin and brace. Improved.  Acute respiratory failure with hypoxia ([place]) Had recurrent aspiration pneumonia during hospitalization.  Was intubated and successfully extubated during hospitalization.  Has completed course of antibiotic for aspiration pneumonia.  Currently on room air.  Appears stable at this time.  Acute pulmonary embolism (HCC) CTA chest (2/7) showed bilateral segmental PE. LE [place]  negative for DVT.  TTE with normal LV systolic function.  Continue Eliquis.  Acute metabolic encephalopathy Improved and at baseline.  CT head, MRI brain, EEG, TSH, B12, ammonia was unremarkable.   E. coli bacteremia Resolved, urine culture and blood cultures x2 08/17/2021 positive for E. coli. Completed 7-day course of Ceftriaxone > Cefazolin.  Severe sepsis due to recurrent aspiration pneumonia Resolved.  Completed course of Zosyn  Sinus tachycardia, resolved Thought to be to be multifactorial secondary to dehydration sepsis pulmonary embolism.  On metoprolol.  Elevated liver enzymes Resolved at this time.  Hepatitis panel was negative.  Right upper quadrant ultrasound was unremarkable,  Hyponatremia Resolved.  Latest sodium of 140.  Tobacco use disorder Off nicotine patch.  Physical deconditioning Significant weakness in all extremities as a result of cervical myelopathy and acute illness.  Continue physical therapy, occupational therapy.  Plan for skilled nursing facility placement.  Urinary retention Foley catheter discontinued on 08/09/2021; replaced on 09/09/2021 for recurrent urinary retention and removed again on 4/25.  Continue Flomax and finasteride.  Able to urinate by himself at this time.  Severe protein-calorie malnutrition, Continue with dietary supplements.  Thrombocytosis Has resolved.  Latest platelet count of 253.  No recent labs in the last 72 hours.  Normocytic anemia Chronic and stable.  No recent CBC but did require 1 PRBC during hospitalization  Hyperglycemia-resolved as of 07/12/2021. Likely due to Steroids. Hemoglobin A1C of 5.2%. Resolved.   DVT prophylaxis: Eliquis  Code Status:   Code Status: Full Code  Family Communication:  None  Disposition Plan:  Medically stable for disposition.  Awaiting for disposition.  Consultants:  PCCM Infectious disease Neurology Palliative care General medicine  Procedures:  ACDF C3-7, neurosurgery  Dr.  Trenton Gammon 2/3 Hematoma evacuation 2/7 Vascular duplex ultrasound bilateral lower extremities 2/19 TTE 2/20 TTE 3/1 IR G-tube placement 3/6, 3/24  Antimicrobials: Vancomycin 2/7 - 2/8; 2/18 - 2/21 Zosyn 2/21 -2/23 Ampicillin 2/7 -2/11 Metronidazole 2/7 - 2/8; 2/17 -2/18 Cefepime 2/18 - 2/21 Unasyn 3/24 - 3/24 Perioperative cefazolin Ceftriaxone 3/25 - 3/27 Cefazolin 3/27 - 4/1   Subjective: Today, patient was seen and examined at bedside.  Denies any nausea vomiting fever chills.   Objective:    10/29/2021    8:35 AM 10/29/2021    5:09 AM 10/29/2021    5:00 AM  Vitals with BMI  Weight   116 lbs 14 oz  Systolic [id number]   Diastolic 78 81   Pulse 85 85     Physical examination: Body mass index is 16.77 kg/m.   General: Thinly built , not in obvious distress HENT:   No scleral pallor or icterus noted. Oral mucosa is moist.  Chest:  Clear breath sounds.  Diminished breath sounds bilaterally. No crackles or wheezes.  CVS: S1 &S2 heard. No murmur.  Regular rate and rhythm. Abdomen: Soft, nontender, nondistended.  Bowel sounds are heard.   Extremities: No cyanosis, clubbing or edema.  Peripheral pulses are palpable. Psych: Alert, awake and oriented, normal mood CNS:  No cranial nerve deficits.  Generalized weakness noted.   Skin: Warm and dry.  No rashes noted.  Data Reviewed: I have personally reviewed the following labs and imaging studies   CBC Lab Results  Component Value Date   WBC 4.8 10/25/2021   RBC 3.50 (L) 10/25/2021   HGB 10.5 (L) 10/25/2021   HCT 32.3 (L) 10/25/2021   MCV 92.3 10/25/2021   MCH 30.0 10/25/2021   PLT 253 10/25/2021   MCHC 32.5 10/25/2021   RDW 15.2 10/25/2021   LYMPHSABS 2.5 10/11/2021   MONOABS 0.5 10/11/2021   EOSABS 0.1 10/11/2021   BASOSABS 0.0 Q000111Q     Last metabolic panel Lab Results  Component Value Date   NA 140 10/25/2021   K 3.8 10/25/2021   CL 107 10/25/2021   CO2 27 10/25/2021   BUN 18 10/25/2021   CREATININE 0.98  10/25/2021   GLUCOSE 115 (H) 10/25/2021   GFRNONAA >60 10/25/2021   CALCIUM 10.1 10/25/2021   PHOS 4.5 09/24/2021   PROT 7.7 10/25/2021   ALBUMIN 3.5 10/25/2021   BILITOT 0.3 10/25/2021   ALKPHOS 111 10/25/2021   AST 27 10/25/2021   ALT 19 10/25/2021   ANIONGAP 6 10/25/2021    GFR: Estimated Creatinine Clearance: 60.1 mL/min (by C-G formula based on SCr of 0.98 mg/dL).  No results found for this or any previous visit (from the past 240 hour(s)).    Radiology Studies: No results found.    LOS: 122 days    Oscar La, MD [facility] 10/29/2021, 10:08 AM  If 7PM-7AM, please contact night-coverage [email or web address]

## 2021-10-29 NOTE — Progress Notes (Signed)
   Providing Compassionate, Quality Care - Together   Subjective: Patient reports persistent pain in his hands.  Objective: Vital signs in last 24 hours: Temp:  [98 F (36.7 C)-98.2 F (36.8 C)] 98.2 F (36.8 C) (06/05 0835) Pulse Rate:  [85-108] 85 (06/05 0835) Resp:  [12-19] 12 (06/05 0835) BP: (103-116)/(78-86) 103/78 (06/05 0835) SpO2:  [98 %-100 %] 99 % (06/05 0835) Weight:  [53 kg] 53 kg (06/05 0500)  Intake/Output from previous day: 06/04 0701 - 06/05 0700 In: -  Out: 750 [Urine:750] Intake/Output this shift: No intake/output data recorded.  Sitting up in chair Alert; oriented to person, place, and time MAE, Generalized weakness Decreased fine motor BUE Right hand in splint Incision is well-healed   Lab Results: No results for input(s): WBC, HGB, HCT, PLT in the last 72 hours. BMET No results for input(s): NA, K, CL, CO2, GLUCOSE, BUN, CREATININE, CALCIUM in the last 72 hours.  Studies/Results: No results found.  Assessment/Plan: Patient is doing well after a very complicated hospital course following four level ACDF. He would like to discharge home. Continue efforts at mobilization. Will increase his gabapentin dose from 100 mg TID to 300 mg TID.    LOS: 122 days     Val Eagle, DNP, AGNP-C Nurse Practitioner  [facility] [address], [place], [place] [address] P: [phone]    F: [phone]  10/29/2021, 11:22 AM   '

## 2021-10-30 LAB — BASIC METABOLIC PANEL
Anion gap: 5 (ref 5–15)
BUN: 17 mg/dL (ref 6–20)
CO2: 26 mmol/L (ref 22–32)
Calcium: 10.3 mg/dL (ref 8.9–10.3)
Chloride: 107 mmol/L (ref 98–111)
Creatinine, Ser: 0.99 mg/dL (ref 0.61–1.24)
GFR, Estimated: >60 mL/min (ref >60)
Glucose, Bld: 90 mg/dL (ref 70–99)
Potassium: 4 mmol/L (ref 3.5–5.1)
Sodium: 138 mmol/L (ref 135–145)

## 2021-10-30 LAB — MAGNESIUM: Magnesium: 2 mg/dL (ref 1.7–2.4)

## 2021-10-30 LAB — CBC
HCT: 32.2 % — ABNORMAL LOW (ref 39.0–52.0)
Hemoglobin: 10.2 g/dL — ABNORMAL LOW (ref 13.0–17.0)
MCH: 29.5 pg (ref 26.0–34.0)
MCHC: 31.7 g/dL (ref 30.0–36.0)
MCV: 93.1 fL (ref 80.0–100.0)
Platelets: 294 10*3/uL (ref 150–400)
RBC: 3.46 MIL/uL — ABNORMAL LOW (ref 4.22–5.81)
RDW: 15.1 % (ref 11.5–15.5)
WBC: 5.7 10*3/uL (ref 4.0–10.5)
nRBC: 0 % (ref 0.0–0.2)

## 2021-10-30 NOTE — Progress Notes (Signed)
Physical Therapy Treatment Patient Details Name: Brian [name] MRN: [id number] DOB: 1960-07-20 Today's Date: 10/30/2021   History of Present Illness Pt is a 61 y/o male admitted 06/29/21 following C3-7 ACDF after progressive cervical myelopathy symptoms including ataxia, bil UE and LE shaking and weakness, and loss of function in his hands with subsequent fall. Pt initially had improvement in neurological symptoms, but postoperative course was complicated by dysphasia and ataxia secondary to and epidural hematoma with evacuation of hematoma on 2/7. Course complicated by delirium secondary to encephalopathy, sepsis secondary to aspiration PNA, reintubated 2/19-2/26 for airway protection, bilat segmental PEs 2/18. PEG placed 3/6. PMHx includes tobacco and EtOH use disorder.    PT Comments    Emphasis on guard level assist overall with sit to stands and progression of gait stability, posture and stamina.    Recommendations for follow up therapy are one component of a multi-disciplinary discharge planning process, led by the attending physician.  Recommendations may be updated based on patient status, additional functional criteria and insurance authorization.  Follow Up Recommendations  Acute inpatient rehab (3hours/day)     Assistance Recommended at Discharge Intermittent Supervision/Assistance  Patient can return home with the following A little help with walking and/or transfers;A little help with bathing/dressing/bathroom;Assistance with cooking/housework;Assist for transportation;Help with stairs or ramp for entrance   Equipment Recommendations  Rolling walker (2 wheels);Wheelchair (measurements PT);Wheelchair cushion (measurements PT)    Recommendations for Other Services       Precautions / Restrictions Precautions Precautions: Fall Precaution Booklet Issued: No Precaution Comments: previous G-tube site (pt pulled it out on evening of 4/29) Required Braces or Orthoses: Other  Brace (RUE resting hand splint) Other Brace: resting hand splint Restrictions Weight Bearing Restrictions: No     Mobility  Bed Mobility               General bed mobility comments: OOB in recliner    Transfers Overall transfer level: Needs assistance Equipment used: Rolling walker (2 wheels) Transfers: Sit to/from Stand Sit to Stand: Min guard Stand pivot transfers: Min guard              Ambulation/Gait Ambulation/Gait assistance: Min guard Gait Distance (Feet): 600 Feet Assistive device: Rolling walker (2 wheels) Gait Pattern/deviations: Step-through pattern   Gait velocity interpretation: <1.8 ft/sec, indicate of risk for recurrent falls   General Gait Details: continues to be ataxic, but with enough control to warrant guard assist.  Improved turns and improved stamina overall.   Stairs             Wheelchair Mobility    Modified Rankin (Stroke Patients Only)       Balance     Sitting balance-Leahy Scale: Fair       Standing balance-Leahy Scale: Fair                              Cognition Arousal/Alertness: Awake/alert Behavior During Therapy: WFL for tasks assessed/performed Overall Cognitive Status: Within Functional Limits for tasks assessed                                          Exercises      General Comments        Pertinent Vitals/Pain Pain Assessment Pain Assessment: Faces Faces Pain Scale: No hurt Pain Intervention(s): Monitored during session    Home  Living                          Prior Function            PT Goals (current goals can now be found in the care plan section) Acute Rehab PT Goals Patient Stated Goal: home PT Goal Formulation: With patient Time For Goal Achievement: 10/29/21 Potential to Achieve Goals: Good Progress towards PT goals: Progressing toward goals    Frequency    Min 3X/week      PT Plan      Co-evaluation               AM-PAC PT "6 Clicks" Mobility   Outcome Measure  Help needed turning from your back to your side while in a flat bed without using bedrails?: None Help needed moving from lying on your back to sitting on the side of a flat bed without using bedrails?: None Help needed moving to and from a bed to a chair (including a wheelchair)?: A Little Help needed standing up from a chair using your arms (e.g., wheelchair or bedside chair)?: A Little Help needed to walk in hospital room?: A Little Help needed climbing 3-5 steps with a railing? : A Little 6 Click Score: 20    End of Session   Activity Tolerance: Patient tolerated treatment well Patient left: in chair;with call bell/phone within reach;with chair alarm set Nurse Communication: Mobility status;Other (comment) PT Visit Diagnosis: Other abnormalities of gait and mobility (R26.89);Other symptoms and signs involving the nervous system [id number])     Time: [id number] PT Time Calculation (min) (ACUTE ONLY): 22 min  Charges:  $Gait Training: 8-22 mins                     10/30/2021  Ginger Carne., PT Acute Rehabilitation Services [phone]  (pager) [phone]  (office)   [name] Geetika Laborde 10/30/2021, 6:27 PM

## 2021-10-30 NOTE — Progress Notes (Signed)
Occupational Therapy Treatment Patient Details Name: Brian [name] MRN: [id number] DOB: 1961/02/16 Today's Date: 10/30/2021   History of present illness Pt is a 61 y/o male admitted 06/29/21 following C3-7 ACDF after progressive cervical myelopathy symptoms including ataxia, bil UE and LE shaking and weakness, and loss of function in his hands with subsequent fall. Pt initially had improvement in neurological symptoms, but postoperative course was complicated by dysphasia and ataxia secondary to and epidural hematoma with evacuation of hematoma on 2/7. Course complicated by delirium secondary to encephalopathy, sepsis secondary to aspiration PNA, reintubated 2/19-2/26 for airway protection, bilat segmental PEs 2/18. PEG placed 3/6. PMHx includes tobacco and EtOH use disorder.   OT comments  Patient received seated in recliner. Patient states he notices better finger extension once RHS is removed but returns to flex position over time, but seems to be helping. Patient led in finger dexterity exercises, finger extension, and therapy band exercises to increase functional strength and FMC to increase independence with self care tasks. Patietn ambulated to sink to perform grooming tasks with built up handle for toothbrush and min to min guard assist for balance. Patient transferred to toilet in bathroom and was min assist for hygiene. Patient continues to perform well with OT and could benefit from further OT services.    Recommendations for follow up therapy are one component of a multi-disciplinary discharge planning process, led by the attending physician.  Recommendations may be updated based on patient status, additional functional criteria and insurance authorization.    Follow Up Recommendations  Skilled nursing-short term rehab (<3 hours/day)    Assistance Recommended at Discharge Intermittent Supervision/Assistance  Patient can return home with the following  A little help with walking and/or  transfers;A little help with bathing/dressing/bathroom;Assistance with cooking/housework;Assistance with feeding;Assist for transportation;Help with stairs or ramp for entrance   Equipment Recommendations  Wheelchair (measurements OT);Wheelchair cushion (measurements OT);Hospital bed;BSC/3in1;Tub/shower seat    Recommendations for Other Services      Precautions / Restrictions Precautions Precautions: Fall Precaution Booklet Issued: No Precaution Comments: previous G-tube site (pt pulled it out on evening of 4/29) Required Braces or Orthoses: Other Brace (RUE resting hand splint) Other Brace: resting hand splint Restrictions Weight Bearing Restrictions: No       Mobility Bed Mobility Overal bed mobility: Needs Assistance             General bed mobility comments: OOB in recliner    Transfers Overall transfer level: Needs assistance Equipment used: Rolling walker (2 wheels) Transfers: Sit to/from Stand Sit to Stand: Min guard           General transfer comment: min guard to stand and navigate with RW     Balance Overall balance assessment: Needs assistance Sitting-balance support: Feet supported, No upper extremity supported Sitting balance-Leahy Scale: Fair     Standing balance support: Reliant on assistive device for balance, During functional activity, No upper extremity supported, Bilateral upper extremity supported, Single extremity supported Standing balance-Leahy Scale: Fair Standing balance comment: able to stand at sink and brush teeth with one extremity support and able to wash hands while leaning on sink                           ADL either performed or assessed with clinical judgement   ADL Overall ADL's : Needs assistance/impaired     Grooming: Wash/dry hands;Wash/dry face;Oral care;Minimal assistance;Standing Grooming Details (indicate cue type and reason): stood at sink with assistance  for balance only                  Toilet Transfer: Min guard;Ambulation;Rolling walker (2 wheels) Toilet Transfer Details (indicate cue type and reason): into bathroom Toileting- Clothing Manipulation and Hygiene: Minimal assistance;Sit to/from stand Toileting - Clothing Manipulation Details (indicate cue type and reason): assist for balance, and use of warm wash cloth for rear peri care       General ADL Comments: performed grooming standing at sink and toileting    Extremity/Trunk Assessment Upper Extremity Assessment RUE Deficits / Details: difficulty extending 4th and 5th digits, initial pain with stretch. RUE Sensation: decreased light touch;decreased proprioception RUE Coordination: decreased fine motor;decreased gross motor LUE Deficits / Details: digits flexion 4/5, digit extension unable to achieve full ROM, mild discoordination LUE Sensation: decreased light touch LUE Coordination: decreased fine motor;decreased gross motor            Vision       Perception     Praxis      Cognition Arousal/Alertness: Awake/alert Behavior During Therapy: WFL for tasks assessed/performed Overall Cognitive Status: Within Functional Limits for tasks assessed                                          Exercises Exercises: General Upper Extremity General Exercises - Upper Extremity Shoulder ABduction: Strengthening, Both, 10 reps, Theraband Theraband Level (Shoulder Abduction): Level 1 (Yellow) Elbow Flexion: Strengthening, Both, 10 reps, Seated, Theraband Theraband Level (Elbow Flexion): Level 1 (Yellow) Elbow Extension: Strengthening, Both, 10 reps, Seated, Theraband Theraband Level (Elbow Extension): Level 1 (Yellow) Other Exercises Other Exercises: BUE dexterity exercises    Shoulder Instructions       General Comments      Pertinent Vitals/ Pain       Pain Assessment Pain Assessment: Faces Faces Pain Scale: Hurts a little bit Pain Location: R > L hand Pain Descriptors /  Indicators: Burning, Discomfort Pain Intervention(s): Monitored during session, Repositioned  Home Living                                          Prior Functioning/Environment              Frequency  Min 2X/week        Progress Toward Goals  OT Goals(current goals can now be found in the care plan section)  Progress towards OT goals: Progressing toward goals  Acute Rehab OT Goals Patient Stated Goal: get better OT Goal Formulation: With patient Time For Goal Achievement: 11/06/21 Potential to Achieve Goals: Good ADL Goals Pt Will Perform Eating: with min assist;with assist to don/doff brace/orthosis;with adaptive utensils;sitting Pt Will Perform Grooming: with supervision;sitting Pt Will Perform Upper Body Bathing: with min assist;with adaptive equipment;sitting Pt Will Transfer to Toilet: with min assist;stand pivot transfer;bedside commode Pt/caregiver will Perform Home Exercise Program: Increased ROM;Increased strength;Both right and left upper extremity;With theraputty;With minimal assist;With written HEP provided Additional ADL Goal #1: Pt will grasp objects with R hand and bring to mouth 10/10 trials in prep for self feeding. Additional ADL Goal #2: Patient will demonstrates ability to complete bimanual manipulation of ADL items with increased time on 5/5 trials. Additional ADL Goal #3: Pt will tolerate R resting hand splint for nighttime wear with no red areas or  skin integrity issues.  Plan Discharge plan remains appropriate;Frequency remains appropriate    Co-evaluation                 AM-PAC OT "6 Clicks" Daily Activity     Outcome Measure   Help from another person eating meals?: A Little Help from another person taking care of personal grooming?: A Little Help from another person toileting, which includes using toliet, bedpan, or urinal?: A Lot Help from another person bathing (including washing, rinsing, drying)?: A Lot Help  from another person to put on and taking off regular upper body clothing?: A Little Help from another person to put on and taking off regular lower body clothing?: A Lot 6 Click Score: 15    End of Session Equipment Utilized During Treatment: Gait belt;Rollator (4 wheels)  OT Visit Diagnosis: Other abnormalities of gait and mobility (R26.89);Muscle weakness (generalized) (M62.81);Feeding difficulties (R63.3);Other symptoms and signs involving the nervous system (R29.898);Other symptoms and signs involving cognitive function Pain - Right/Left: Right Pain - part of body: Hand   Activity Tolerance Patient tolerated treatment well   Patient Left in chair;with call bell/phone within reach;with chair alarm set   Nurse Communication Mobility status        Time: [id number] OT Time Calculation (min): 43 min  Charges: OT General Charges $OT Visit: 1 Visit OT Treatments $Self Care/Home Management : 8-22 mins $Therapeutic Exercise: 23-37 mins  Lodema Hong, [place]  Pager [phone] Office [place] 10/30/2021, 3:18 PM

## 2021-10-30 NOTE — Progress Notes (Signed)
Clinical  PROGRESS NOTE    Brian [name]  [id number] DOB: 1961/04/10 DOA: 06/29/2021 PCP: Pcp, No  Primary service: Neurosurgery  Brief Narrative: Brian [name] is a 61 year old male with past medical history of tobacco and alcohol use was admitted by neurosurgery for cervical myelopathy due to critical multilevel cervical spinal canal stenosis at C3-C6 with spinal cord compression.  TRH on board for medical assistance. Awaiting placement/improvement with mobility to be discharged home.  Assessment and Plan:  Principal Problem:   Cervical myelopathy ([place]); severe cervical spinal stenosis with cord compression s/p ACDF [id number] on 2/3, complicated by epidural hematoma s/p evacuation 2/7 Active Problems:   Acute respiratory failure with hypoxia (HCC)   Acute pulmonary embolism (HCC)   Acute metabolic encephalopathy   Severe sepsis due to recurrent aspiration pneumonia   E. coli bacteremia   Sinus tachycardia   Elevated liver enzymes   Hyponatremia   Tobacco use disorder   Physical deconditioning   Urinary retention   Protein-calorie malnutrition, severe (HCC)   Lumbar back pain   Fall during current hospitalization   Normocytic anemia   Thrombocytosis   Citrobacter infection   Vomiting   Abnormal LFTs   Aspiration pneumonia (HCC)   Cervical myelopathy ([place]); severe cervical spinal stenosis with cord compression s/p ACDF Q000111Q on 2/3, complicated by epidural hematoma s/p evacuation 2/7 Currently awaiting for skilled nursing facility placement.  Right hand pain  Continue gabapentin and brace.  Still complains of hand pain.  Gabapentin dose has been increased by neurosurgery.  Acute respiratory failure with hypoxia ([place]) Had recurrent aspiration pneumonia during hospitalization.  Was intubated and successfully extubated during hospitalization.  Has completed course of antibiotic for aspiration pneumonia.  Currently on room air.  Appears stable at this time.  Acute  pulmonary embolism (HCC) CTA chest (2/7) showed bilateral segmental PE. LE [place] negative for DVT.  TTE with normal LV systolic function.  Continue Eliquis for anticoagulation..  Acute metabolic encephalopathy Improved and at baseline.  CT head, MRI brain, EEG, TSH, B12, ammonia was unremarkable.   E. coli bacteremia Resolved, urine culture and blood cultures x2 08/17/2021 positive for E. coli. Completed 7-day course of Ceftriaxone > Cefazolin.  No urinary issues at this time  Severe sepsis due to recurrent aspiration pneumonia Resolved.  Completed course of IV Zosyn  Sinus tachycardia, resolved Thought to be to be multifactorial secondary to dehydration sepsis, pulmonary embolism.  On metoprolol.  Elevated liver enzymes Resolved at this time.  Hepatitis panel was negative.  Right upper quadrant ultrasound was unremarkable,  Hyponatremia Resolved.  Latest sodium of 138.  Tobacco use disorder Off nicotine patch.  Physical deconditioning Significant weakness in all extremities as a result of cervical myelopathy and acute illness.  Continue physical therapy, occupational therapy.  Plan for skilled nursing facility placement.  Urinary retention Was initially on Foley catheter.  Currently able to urinate by himself.  Continue Flomax and finasteride.    Severe protein-calorie malnutrition, Continue with dietary supplements.  Thrombocytosis Has resolved.  Latest platelet count of 294.    Normocytic anemia Chronic and stable.  Patient received 1 PRBC during hospitalization.  Latest hemoglobin of 10.2  Hyperglycemia-resolved as of 07/12/2021. Likely due to Steroids. Hemoglobin A1C of 5.2%. Resolved.   DVT prophylaxis: Eliquis  Code Status:   Code Status: Full Code  Family Communication:  None  Disposition Plan:  Medically stable for disposition.  Awaiting for disposition.  Consultants:  PCCM Infectious disease Neurology Palliative care General medicine  Procedures:  ACDF  C3-7, neurosurgery Dr. Trenton Gammon 2/3 Hematoma evacuation 2/7 Vascular duplex ultrasound bilateral lower extremities 2/19 TTE 2/20 TTE 3/1 IR G-tube placement 3/6, 3/24  Antimicrobials: Vancomycin 2/7 - 2/8; 2/18 - 2/21 Zosyn 2/21 -2/23 Ampicillin 2/7 -2/11 Metronidazole 2/7 - 2/8; 2/17 -2/18 Cefepime 2/18 - 2/21 Unasyn 3/24 - 3/24 Perioperative cefazolin Ceftriaxone 3/25 - 3/27 Cefazolin 3/27 - 4/1   Subjective: Today, patient was seen and examined at bedside.  Complains of hand pain.  No shortness of breath chest pain dizziness lightheadedness.  No nausea vomiting.    Objective:    10/30/2021    7:21 AM 10/30/2021    4:30 AM 10/30/2021   12:43 AM  Vitals with BMI  Systolic [id number]  Diastolic 87 77 73  Pulse 82 80 70    Physical examination: Body mass index is 16.77 kg/m.   General: Thinly built, not in obvious distress, Communicative HENT:   No scleral pallor or icterus noted. Oral mucosa is moist.  Chest:  Clear breath sounds.  Diminished breath sounds bilaterally. No crackles or wheezes.  CVS: S1 &S2 heard. No murmur.  Regular rate and rhythm. Abdomen: Soft, nontender, nondistended.  Bowel sounds are heard.   Extremities: No cyanosis, clubbing or edema.  Peripheral pulses are palpable. Psych: Alert, awake and oriented, normal mood CNS:  No cranial nerve deficits.  Moves all extremities, mild swelling over the wrist with decreased handgrip on the right. Skin: Warm and dry.  No rashes noted.   Data Reviewed: I have personally reviewed the following labs and imaging studies   CBC Lab Results  Component Value Date   WBC 5.7 10/30/2021   RBC 3.46 (L) 10/30/2021   HGB 10.2 (L) 10/30/2021   HCT 32.2 (L) 10/30/2021   MCV 93.1 10/30/2021   MCH 29.5 10/30/2021   PLT 294 10/30/2021   MCHC 31.7 10/30/2021   RDW 15.1 10/30/2021   LYMPHSABS 2.5 10/11/2021   MONOABS 0.5 10/11/2021   EOSABS 0.1 10/11/2021   BASOSABS 0.0 Q000111Q     Last metabolic panel Lab Results   Component Value Date   NA 138 10/30/2021   K 4.0 10/30/2021   CL 107 10/30/2021   CO2 26 10/30/2021   BUN 17 10/30/2021   CREATININE 0.99 10/30/2021   GLUCOSE 90 10/30/2021   GFRNONAA >60 10/30/2021   CALCIUM 10.3 10/30/2021   PHOS 4.5 09/24/2021   PROT 7.7 10/25/2021   ALBUMIN 3.5 10/25/2021   BILITOT 0.3 10/25/2021   ALKPHOS 111 10/25/2021   AST 27 10/25/2021   ALT 19 10/25/2021   ANIONGAP 5 10/30/2021    GFR: Estimated Creatinine Clearance: 59.5 mL/min (by C-G formula based on SCr of 0.99 mg/dL).  No results found for this or any previous visit (from the past 240 hour(s)).    Radiology Studies: No results found.    LOS: 123 days    Oscar La, MD [facility] 10/30/2021, 9:57 AM  If 7PM-7AM, please contact night-coverage [email or web address]

## 2021-10-30 NOTE — Progress Notes (Signed)
Pt wheeled off unit. IV removed

## 2021-10-30 NOTE — Progress Notes (Signed)
Overall stable.  No new issues.  Continue supportive efforts.

## 2021-10-30 NOTE — Progress Notes (Signed)
Nutrition Follow-up  DOCUMENTATION CODES:  Severe malnutrition in context of social or environmental circumstances, Underweight  INTERVENTION:  Continue current diet as ordered, encourage PO intake Boost Plus TID to provide 360kcal and 14g of protein per carton Continue vitamin regimen  NUTRITION DIAGNOSIS:  Severe Malnutrition related to social / environmental circumstances as evidenced by severe muscle depletion, severe fat depletion.  - ongoing   GOAL:  Patient will meet greater than or equal to 90% of their needs  - progressing, increasing oral intake  MONITOR:  PO intake, Supplement acceptance, Skin, Weight trends, I & O's  REASON FOR ASSESSMENT:  Consult Assessment of nutrition requirement/status, Enteral/tube feeding initiation and management  ASSESSMENT:  Pt with hx EtOH abuse, tobacco use, and spinal stenosis initially presented 2/3 for planned multilevel anterior cervical decompression and fusion surgery after experiencing progressive bilateral upper and lower extremity weakness and spasticity due to critical multilevel cervical spinal stenosis with spinal cord compression and signal change.  2/3 - s/p anterior cervical discectomy with interbody fusion  2/5 - pt initially complained of worsening swallowing function 2/6 - MBS, NPO per SLP 2/7 - transferred to ICU, s/p re-exploration anterior cervical fusion with evacuation of epidural hematoma 2/8 - Cortrak tube placed (tip gastric), tube feeds initiated 2/10 - MBS, diet advanced to dysphagia 2 with nectar-thick liquids, Cortrak removed 2/19- Intubated after desaturation, possibly from PO intake 2/20 - s/p cortrak tube; post pyloric  2/26 - Extubated 3/02 - failed MBS 3/6 - PEG placed 3/23 - diet advanced to dysphagia 2 with nectar thick liquids s/p MBS 3/24 - PEG replaced 4/28 - Pt removed PEG  Pt with great intake since last assessment and continued intake of supplements. Transferred to new unit today. Will  continue current nutrition plan.  Average Meal Intake: 5/30-6/5: 89% average intake x 7 recorded meals  Nutritionally Relevant Medications: Scheduled Meds:  baclofen  25 mg Oral TID   folic acid  1 mg Oral Daily   lactose free nutrition  237 mL Oral TID WC   multivitamin with minerals  1 tablet Oral Daily   senna-docusate  1 tablet Oral BID   thiamine  100 mg Oral Daily   PRN Meds: bisacodyl, diphenhydrAMINE, ondansetron, polyethylene glycol  Labs Reviewed  Diet Order:   Diet Order             Diet regular Room service appropriate? Yes with Assist; Fluid consistency: Thin  Diet effective now                  EDUCATION NEEDS:  Education needs have been addressed  Skin:  Skin Assessment: Reviewed RN Assessment  Last BM:  6/2  Height:  Ht Readings from Last 1 Encounters:  07/15/21 5\' 10"  (1.778 m)   Weight:  Wt Readings from Last 1 Encounters:  10/29/21 53 kg   Ideal Body Weight:  78.2 kg  BMI:  Body mass index is 16.77 kg/m.  Estimated Nutritional Needs:  Kcal:  2100-2300 Protein:  105-125 grams Fluid:  >2L/day   Ranell Patrick, RD, LDN Clinical Dietitian RD pager # available in AMION  After hours/weekend pager # available in [facility]

## 2021-10-31 NOTE — Progress Notes (Signed)
   Providing Compassionate, Quality Care - Together   Subjective: Patient reports no issues overnight. Still with bilateral wrist and hand pain.  Objective: Vital signs in last 24 hours: Temp:  [97.6 F (36.4 C)-98.2 F (36.8 C)] 98 F (36.7 C) (06/07 1050) Pulse Rate:  [75-106] 106 (06/07 1050) Resp:  [12-18] 18 (06/07 1050) BP: (104-144)/(83-84) 104/83 (06/07 1050) SpO2:  [100 %] 100 % (06/07 1050)  Intake/Output from previous day: 06/06 0701 - 06/07 0700 In: 480 [P.O.:480] Out: 300 [Urine:300] Intake/Output this shift: No intake/output data recorded.  Sitting up in chair Alert; oriented to person, place, and time MAE, Generalized weakness Decreased fine motor BUE Incision is well-healed  Lab Results: Recent Labs    10/30/21 0305  WBC 5.7  HGB 10.2*  HCT 32.2*  PLT 294   BMET Recent Labs    10/30/21 0305  NA 138  K 4.0  CL 107  CO2 26  GLUCOSE 90  BUN 17  CREATININE 0.99  CALCIUM 10.3    Studies/Results: No results found.  Assessment/Plan: Patient is doing well after a very complicated hospital course following four level ACDF. He would like to discharge home. Continue efforts at mobilization. Gabapentin and baclofen increased on Monday.   LOS: 124 days     Val Eagle, DNP, AGNP-C Nurse Practitioner  [facility] [address], [place], [place] [address] P: [phone]    F: [phone]  10/31/2021, 11:22 AM

## 2021-10-31 NOTE — Progress Notes (Signed)
Physical Therapy Treatment Patient Details Name: Brian [name] MRN: [id number] DOB: 08-Jan-1961 Today's Date: 10/31/2021   History of Present Illness Pt is a 61 y/o male admitted 06/29/21 following C3-7 ACDF after progressive cervical myelopathy symptoms including ataxia, bil UE and LE shaking and weakness, and loss of function in his hands with subsequent fall. Pt initially had improvement in neurological symptoms, but postoperative course was complicated by dysphasia and ataxia secondary to and epidural hematoma with evacuation of hematoma on 2/7. Course complicated by delirium secondary to encephalopathy, sepsis secondary to aspiration PNA, reintubated 2/19-2/26 for airway protection, bilat segmental PEs 2/18. PEG placed 3/6. PMHx includes tobacco and EtOH use disorder.    PT Comments    Pt initially unsure of new therapist agreeing to short walk in hallway however as session progressed agreeable to additional seated exercise. Session started with providing modA for donning scrubs to walk in hallway. Pt min guard for ambulation, when performing higher level balance, pt maintains steadiness but decrease in gait speed. With return to room pt performed seated exercise. Pt provided STM and heat packs at end of session to decrease pain in hands. D/c plans remain appropriate at this time. PT will continue to follow acutely.   Recommendations for follow up therapy are one component of a multi-disciplinary discharge planning process, led by the attending physician.  Recommendations may be updated based on patient status, additional functional criteria and insurance authorization.  Follow Up Recommendations  Acute inpatient rehab (3hours/day)     Assistance Recommended at Discharge Intermittent Supervision/Assistance  Patient can return home with the following A little help with walking and/or transfers;A little help with bathing/dressing/bathroom;Assistance with cooking/housework;Assist for  transportation;Help with stairs or ramp for entrance   Equipment Recommendations  Rolling walker (2 wheels);Wheelchair (measurements PT);Wheelchair cushion (measurements PT)       Precautions / Restrictions Precautions Precautions: Fall Other Brace: resting hand splint     Mobility  Bed Mobility               General bed mobility comments: OOB in recliner    Transfers Overall transfer level: Needs assistance Equipment used: Rolling walker (2 wheels) Transfers: Sit to/from Stand Sit to Stand: Min guard           General transfer comment: min guard for coming to standing in RW, good power up and self steady    Ambulation/Gait Ambulation/Gait assistance: Min guard Gait Distance (Feet): 450 Feet Assistive device: Rolling walker (2 wheels) Gait Pattern/deviations: Step-through pattern Gait velocity: decr; Gait velocity interpretation: <1.8 ft/sec, indicate of risk for recurrent falls   General Gait Details: ataxic gait, however able to control, especially when he has  good proximity to RW, cues for staying in walker with turning          Balance     Sitting balance-Leahy Scale: Fair     Standing balance support: Reliant on assistive device for balance, During functional activity, No upper extremity supported, Bilateral upper extremity supported, Single extremity supported Standing balance-Leahy Scale: Fair               High level balance activites: Head turns, Turns High Level Balance Comments: able to incorporate high level balance activities however gait speed slows with implimentation            Cognition Arousal/Alertness: Awake/alert Behavior During Therapy: WFL for tasks assessed/performed Overall Cognitive Status: Within Functional Limits for tasks assessed  Exercises General Exercises - Upper Extremity Shoulder ABduction: Strengthening, Both, 20 reps, Seated Theraband Level  (Shoulder Abduction): Level 1 (Yellow) Elbow Flexion: Strengthening, Both, Seated, Theraband, 20 reps Theraband Level (Elbow Flexion): Level 1 (Yellow)    General Comments General comments (skin integrity, edema, etc.): pain in bilateral hands with gripping RW, provided STM and heat packs at end of session      Pertinent Vitals/Pain Pain Assessment Pain Assessment: Faces Faces Pain Scale: Hurts little more Pain Location: R > L hand Pain Descriptors / Indicators: Burning, Discomfort Pain Intervention(s): Monitored during session, Repositioned, Heat applied     PT Goals (current goals can now be found in the care plan section) Acute Rehab PT Goals Patient Stated Goal: home PT Goal Formulation: With patient Time For Goal Achievement: 10/29/21 Potential to Achieve Goals: Good Progress towards PT goals: Progressing toward goals    Frequency    Min 3X/week      PT Plan Current plan remains appropriate       AM-PAC PT "6 Clicks" Mobility   Outcome Measure  Help needed turning from your back to your side while in a flat bed without using bedrails?: None Help needed moving from lying on your back to sitting on the side of a flat bed without using bedrails?: None Help needed moving to and from a bed to a chair (including a wheelchair)?: A Little Help needed standing up from a chair using your arms (e.g., wheelchair or bedside chair)?: A Little Help needed to walk in hospital room?: A Little Help needed climbing 3-5 steps with a railing? : A Little 6 Click Score: 20    End of Session Equipment Utilized During Treatment: Gait belt Activity Tolerance: Patient tolerated treatment well Patient left: in chair;with call bell/phone within reach;with chair alarm set Nurse Communication: Mobility status;Other (comment) PT Visit Diagnosis: Other abnormalities of gait and mobility (R26.89);Other symptoms and signs involving the nervous system (R29.898)     Time: [id number] PT Time  Calculation (min) (ACUTE ONLY): 39 min  Charges:  $Gait Training: 8-22 mins $Therapeutic Exercise: 8-22 mins $Therapeutic Activity: 8-22 mins                     Janiel Crisostomo B. Migdalia Dk PT, DPT Acute Rehabilitation Services Please use secure chat or  Call Office [phone]    [place] 10/31/2021, 4:02 PM

## 2021-10-31 NOTE — Progress Notes (Signed)
Progress Note    Brian [name]   [id number]  DOB: 01/07/1961  DOA: 06/29/2021     124 PCP: Pcp, No  Initial CC: neck pain  Hospital Course: Brian [name] is a 61 year old male with past medical history of tobacco and alcohol use was admitted by neurosurgery for cervical myelopathy due to critical multilevel cervical spinal canal stenosis at C3-C6 with spinal cord compression.  TRH on board for medical assistance. Awaiting placement/improvement with mobility to be discharged home.  Assessment and Plan:  Principal Problem:   Cervical myelopathy (HCC); severe cervical spinal stenosis with cord compression s/p ACDF C3-7 on 2/3, complicated by epidural hematoma s/p evacuation 2/7 Active Problems:   Acute respiratory failure with hypoxia (HCC)   Acute pulmonary embolism (HCC)   Acute metabolic encephalopathy   Severe sepsis due to recurrent aspiration pneumonia   E. coli bacteremia   Sinus tachycardia   Elevated liver enzymes   Hyponatremia   Tobacco use disorder   Physical deconditioning   Urinary retention   Protein-calorie malnutrition, severe (HCC)   Lumbar back pain   Fall during current hospitalization   Normocytic anemia   Thrombocytosis   Citrobacter infection   Vomiting   Abnormal LFTs   Aspiration pneumonia (HCC)  Cervical myelopathy (HCC); severe cervical spinal stenosis with cord compression s/p ACDF C3-7 on 2/3, complicated by epidural hematoma s/p evacuation 2/7 - awaiting further improvement in mobility as patient lives alone   Right hand pain Continue gabapentin and brace.  Acute respiratory failure with hypoxia (HCC) - resolved  Had recurrent aspiration pneumonia during hospitalization.  Was intubated and successfully extubated during hospitalization.  Has completed course of antibiotic for aspiration pneumonia.  Currently on room air.  Appears stable at this time.  Acute pulmonary embolism (HCC) CTA chest (2/7) showed bilateral segmental PE. LE  [place] negative for DVT.  TTE with normal LV systolic function.   - Continue Eliquis for anticoagulation..  Acute metabolic encephalopathy Improved and at baseline.  CT head, MRI brain, EEG, TSH, B12, ammonia was unremarkable.   E. coli bacteremia - resolved  Resolved, urine culture and blood cultures x2 08/17/2021 positive for E. coli. Completed 7-day course of Ceftriaxone > Cefazolin.  No urinary issues at this time  Severe sepsis due to recurrent aspiration pneumonia - resolved  Completed course of IV Zosyn  Sinus tachycardia, resolved Thought to be to be multifactorial secondary to dehydration sepsis, pulmonary embolism.  On metoprolol.  Elevated liver enzymes Resolved at this time.  Hepatitis panel was negative.  Right upper quadrant ultrasound was unremarkable,  Hyponatremia - resolved   Tobacco use disorder Off nicotine patch.  Physical deconditioning Significant weakness in all extremities as a result of cervical myelopathy and acute illness.  Continue physical therapy, occupational therapy  Urinary retention Was initially on Foley catheter.  Currently able to urinate by himself.  Continue Flomax and finasteride.    Severe protein-calorie malnutrition, Continue with dietary supplements.  Thrombocytosis Has resolved.  Latest platelet count of 294.    Normocytic anemia Chronic and stable.  Patient received 1 PRBC during hospitalization.  Latest hemoglobin of 10.2  Hyperglycemia-resolved as of 07/12/2021. Likely due to Steroids. Hemoglobin A1C of 5.2%. Resolved.   Interval History:  No events overnight.  Sitting up in recliner when seen.  Able to pick up his cup and drink using both hands.  He is also ambulating in the hall with physical therapy.  Old records reviewed in assessment of this patient  DVT prophylaxis:  SCD's Start: 06/29/21 1231 apixaban (ELIQUIS) tablet 5 mg   Code Status:   Code Status: Full Code  Disposition Plan: Pending improved mobility to  discharge home Status is: Inpatient  Objective: Blood pressure 104/83, pulse (!) 106, temperature 98 F (36.7 C), temperature source Oral, resp. rate 18, height 5\' 10"  (1.778 m), weight 53 kg, SpO2 100 %.  Examination:  Physical Exam Constitutional:      General: He is not in acute distress.    Appearance: Normal appearance.  HENT:     Head: Normocephalic and atraumatic.     Mouth/Throat:     Mouth: Mucous membranes are moist.  Eyes:     Extraocular Movements: Extraocular movements intact.  Cardiovascular:     Rate and Rhythm: Normal rate and regular rhythm.     Heart sounds: Normal heart sounds.  Pulmonary:     Effort: Pulmonary effort is normal. No respiratory distress.     Breath sounds: Normal breath sounds. No wheezing.  Abdominal:     General: Bowel sounds are normal. There is no distension.     Palpations: Abdomen is soft.     Tenderness: There is no abdominal tenderness.  Musculoskeletal:        General: Normal range of motion.     Cervical back: Normal range of motion and neck supple.  Skin:    General: Skin is warm and dry.  Neurological:     General: No focal deficit present.     Mental Status: He is alert.     Comments: Mild weakness appreciated with handgrip  Psychiatric:        Mood and Affect: Mood normal.        Behavior: Behavior normal.       LOS: 124 days   , MD [facility] 10/31/2021, 3:15 PM

## 2021-10-31 NOTE — Plan of Care (Signed)

## 2021-10-31 NOTE — Progress Notes (Signed)
CSW spoke with patient at bedside to discuss discharge plan. Patient reports his son has been paying his rent at his apartment and that he wants to go home. Patient states he is not going to a facility at discharge and wants to go home ASAP. Patient states he was working at [employer] center prior to hospitalization. Patient states he is able to ambulate safely with a walker.   CSW attempted to reach patient's son without success - a voicemail was left requesting a return call.  CSW spoke with Madaline Guthrie at the patient's apartment complex to determine if the patient still had housing at the complex. Madaline Guthrie stated he will contact the company [employer] and spatial [employer] and have them contact CSW with additional information.   Edwin Dada, MSW, LCSW Transitions of Care  Clinical Social Worker II [phone]

## 2021-11-01 NOTE — TOC Progression Note (Signed)
Transition of Care [facility]) - Progression Note    Patient Details  Name: JAKI DUDAK MRN: [id number] Date of Birth: 1960-09-19  Transition of Care [facility]) CM/SW [place], RN Phone Number: 11/01/2021, 3:02 PM  Clinical Narrative:    I called [place] to arrange [place] services and the patient no longer has coverage under this policy and is currently uninsured.  I called Amy, RNCM with [facility] and she is able to offer charity allowance for PT visits at the home.  The Agency does not have home health aide availability at this time.  The patient's son sill be providing 3-4 hours of supervision at the home along with transportation to appointments.  CM and MSW with DTP Team will continue to follow the patient for discharge needs to home.   Expected Discharge Plan: [place] Barriers to Discharge: Family Issues, Continued Medical Work up, Inadequate or no insurance  Expected Discharge Plan and Services Expected Discharge Plan: [place] In-house Referral: PCP / [facility] Discharge Planning Services: CM Consult, Medication Assistance, Follow-up appt scheduled Post Acute Care Choice: [place] arrangements for the past 2 months: Apartment                 DME Arranged: Walker rolling DME Agency: [employer] Date DME Agency Contacted: 11/01/21 Time DME Agency Contacted: 1501 Representative spoke with at DME Agency: Farrel Conners HH Arranged: PT [place] Agency: [place] Date [place]: 11/01/21 Time [place]: 1501 Representative spoke with at [place]: Gevena Barre, [place] with [place]   Social Determinants of Health (SDOH) Interventions    Readmission Risk Interventions     No data to display

## 2021-11-01 NOTE — Progress Notes (Addendum)
2pm: CSW received return call from patient's son [name] who states the patient's apartment is available and ready for him to return home to. [name] states he is able to provide the patient with a few hours everyday of supervision and support.  CSW requested [facility] home health orders.  CSW spoke with Kandee Keen of [place] who states the agency cannot provide [facility] services.  CSW spoke with Barbara Cower of [facility] who states the agency cannot provide [facility] services.  CSW spoke with [place] of [employer] to order a charity rolling walker.  10:30am: CSW attempted to reach patient's son [name] again without success - a voicemail and text were sent requesting a return call.  Edwin Dada, MSW, LCSW Transitions of Care  Clinical Social Worker II [phone]

## 2021-11-01 NOTE — Plan of Care (Signed)

## 2021-11-01 NOTE — Progress Notes (Signed)
Occupational Therapy Treatment Patient Details Name: Brian [name] MRN: [id number] DOB: August 01, 1960 Today's Date: 11/01/2021   History of present illness Pt is a 61 y/o male admitted 06/29/21 following C3-7 ACDF after progressive cervical myelopathy symptoms including ataxia, bil UE and LE shaking and weakness, and loss of function in his hands with subsequent fall. Pt initially had improvement in neurological symptoms, but postoperative course was complicated by dysphasia and ataxia secondary to and epidural hematoma with evacuation of hematoma on 2/7. Course complicated by delirium secondary to encephalopathy, sepsis secondary to aspiration PNA, reintubated 2/19-2/26 for airway protection, bilat segmental PEs 2/18. PEG placed 3/6. PMHx includes tobacco and EtOH use disorder.   OT comments  Patient performed HEP seated in recliner with therapy band exercises with handles to assist with grip and [place] and dexterity exercises to address finger flexion and extension.  Patient demonstrated difficulty with LUE 5 digit adduction and address with resistive exercises. Patient states RHS is aiding with RUE finger extension. Patient performed mobility with RW and min guard assist for safety. Patient continues to make good progress and is motivated towards therapy. Acute OT to continue to follow.     Recommendations for follow up therapy are one component of a multi-disciplinary discharge planning process, led by the attending physician.  Recommendations may be updated based on patient status, additional functional criteria and insurance authorization.    Follow Up Recommendations  Skilled nursing-short term rehab (<3 hours/day)    Assistance Recommended at Discharge Intermittent Supervision/Assistance  Patient can return home with the following  A little help with walking and/or transfers;A little help with bathing/dressing/bathroom;Assistance with cooking/housework;Assistance with feeding;Assist for  transportation;Help with stairs or ramp for entrance   Equipment Recommendations  Wheelchair (measurements OT);Wheelchair cushion (measurements OT);Hospital bed;BSC/3in1;Tub/shower seat    Recommendations for Other Services      Precautions / Restrictions Precautions Precautions: Fall Other Brace: resting hand splint       Mobility Bed Mobility Overal bed mobility: Needs Assistance             General bed mobility comments: OOB in recliner    Transfers Overall transfer level: Needs assistance Equipment used: Rolling walker (2 wheels) Transfers: Sit to/from Stand Sit to Stand: Min guard           General transfer comment: min guard to power up and during mobility and transfers     Balance Overall balance assessment: Needs assistance Sitting-balance support: Feet supported, No upper extremity supported Sitting balance-Leahy Scale: Fair     Standing balance support: Reliant on assistive device for balance, During functional activity, No upper extremity supported, Bilateral upper extremity supported, Single extremity supported Standing balance-Leahy Scale: Fair Standing balance comment: reliant on walker for balance                           ADL either performed or assessed with clinical judgement   ADL                                              Extremity/Trunk Assessment Upper Extremity Assessment RUE Deficits / Details: difficulty extending 4th and 5th digits, initial pain with stretch. RUE Sensation: decreased light touch;decreased proprioception RUE Coordination: decreased fine motor;decreased gross motor LUE Deficits / Details: digits flexion 4/5, digit extension unable to achieve full ROM, mild discoordination LUE Sensation:  decreased light touch LUE Coordination: decreased fine motor;decreased gross motor            Vision       Perception     Praxis      Cognition Arousal/Alertness: Awake/alert Behavior  During Therapy: WFL for tasks assessed/performed Overall Cognitive Status: Within Functional Limits for tasks assessed                                          Exercises Exercises: General Upper Extremity General Exercises - Upper Extremity Shoulder ABduction: Strengthening, Both, 20 reps, Seated Theraband Level (Shoulder Abduction): Level 1 (Yellow) Elbow Flexion: Strengthening, Both, Seated, Theraband, 20 reps Theraband Level (Elbow Flexion): Level 1 (Yellow) Elbow Extension: Strengthening, Both, Seated, Theraband, 20 reps Theraband Level (Elbow Extension): Level 1 (Yellow) Other Exercises Other Exercises: [facility] and dexterity exercises to BUE to increase functional strength and use    Shoulder Instructions       General Comments      Pertinent Vitals/ Pain       Pain Assessment Pain Assessment: Faces Faces Pain Scale: Hurts a little bit Pain Location: R > L hand Pain Descriptors / Indicators: Burning, Discomfort Pain Intervention(s): Monitored during session  Home Living                                          Prior Functioning/Environment              Frequency  Min 2X/week        Progress Toward Goals  OT Goals(current goals can now be found in the care plan section)  Progress towards OT goals: Progressing toward goals  Acute Rehab OT Goals Patient Stated Goal: go home OT Goal Formulation: With patient Time For Goal Achievement: 11/06/21 Potential to Achieve Goals: Good ADL Goals Pt Will Perform Eating: with min assist;with assist to don/doff brace/orthosis;with adaptive utensils;sitting Pt Will Perform Grooming: with supervision;sitting Pt Will Perform Upper Body Bathing: with min assist;with adaptive equipment;sitting Pt Will Transfer to Toilet: with min assist;stand pivot transfer;bedside commode Pt/caregiver will Perform Home Exercise Program: Increased ROM;Increased strength;Both right and left upper  extremity;With theraputty;With minimal assist;With written HEP provided Additional ADL Goal #1: Pt will grasp objects with R hand and bring to mouth 10/10 trials in prep for self feeding. Additional ADL Goal #2: Patient will demonstrates ability to complete bimanual manipulation of ADL items with increased time on 5/5 trials. Additional ADL Goal #3: Pt will tolerate R resting hand splint for nighttime wear with no red areas or skin integrity issues.  Plan Discharge plan remains appropriate;Frequency remains appropriate    Co-evaluation                 AM-PAC OT "6 Clicks" Daily Activity     Outcome Measure   Help from another person eating meals?: A Little Help from another person taking care of personal grooming?: A Little Help from another person toileting, which includes using toliet, bedpan, or urinal?: A Little Help from another person bathing (including washing, rinsing, drying)?: A Little Help from another person to put on and taking off regular upper body clothing?: A Little Help from another person to put on and taking off regular lower body clothing?: A Lot 6 Click Score: 17    End  of Session Equipment Utilized During Treatment: Gait belt;Rolling walker (2 wheels)  OT Visit Diagnosis: Other abnormalities of gait and mobility (R26.89);Muscle weakness (generalized) (M62.81);Feeding difficulties (R63.3);Other symptoms and signs involving the nervous system (R29.898);Other symptoms and signs involving cognitive function Pain - Right/Left: Right Pain - part of body: Hand   Activity Tolerance Patient tolerated treatment well   Patient Left in chair;with call bell/phone within reach;with chair alarm set   Nurse Communication Mobility status        Time: [id number] OT Time Calculation (min): 27 min  Charges: OT General Charges $OT Visit: 1 Visit OT Treatments $Therapeutic Activity: 8-22 mins $Therapeutic Exercise: 8-22 mins  Lodema Hong, [place]  Pager [phone] Office [phone]   Trixie Dredge 11/01/2021, 8:58 AM

## 2021-11-01 NOTE — Progress Notes (Signed)
Progress Note    Brian [name]   [id number]  DOB: 1960-08-01  DOA: 06/29/2021     125 PCP: Pcp, No  Initial CC: neck pain  Hospital Course: Brian [name] is a 61 year old male with past medical history of tobacco and alcohol use was admitted by neurosurgery for cervical myelopathy due to critical multilevel cervical spinal canal stenosis at C3-C6 with spinal cord compression.  TRH on board for medical assistance. Awaiting placement/improvement with mobility to be discharged home.  Assessment and Plan:  Principal Problem:   Cervical myelopathy ([place]); severe cervical spinal stenosis with cord compression s/p ACDF [id number] on 2/3, complicated by epidural hematoma s/p evacuation 2/7 Active Problems:   Acute respiratory failure with hypoxia (HCC)   Acute pulmonary embolism (HCC)   Acute metabolic encephalopathy   Severe sepsis due to recurrent aspiration pneumonia   E. coli bacteremia   Sinus tachycardia   Elevated liver enzymes   Hyponatremia   Tobacco use disorder   Physical deconditioning   Urinary retention   Protein-calorie malnutrition, severe (HCC)   Lumbar back pain   Fall during current hospitalization   Normocytic anemia   Thrombocytosis   Citrobacter infection   Vomiting   Abnormal LFTs   Aspiration pneumonia (HCC)  Cervical myelopathy (HCC); severe cervical spinal stenosis with cord compression s/p ACDF [id number] on 2/3, complicated by epidural hematoma s/p evacuation 2/7 - awaiting further improvement in mobility as patient lives alone   Right hand pain Continue gabapentin and brace.  Acute respiratory failure with hypoxia ([place]) - resolved  Had recurrent aspiration pneumonia during hospitalization.  Was intubated and successfully extubated during hospitalization.  Has completed course of antibiotic for aspiration pneumonia.  Currently on room air.  Appears stable at this time.  Acute pulmonary embolism (HCC) CTA chest (2/7) showed bilateral segmental PE. LE  [place] negative for DVT.  TTE with normal LV systolic function.   - Continue Eliquis for anticoagulation..  Acute metabolic encephalopathy Improved and at baseline.  CT head, MRI brain, EEG, TSH, B12, ammonia was unremarkable.   E. coli bacteremia - resolved  Resolved, urine culture and blood cultures x2 08/17/2021 positive for E. coli. Completed 7-day course of Ceftriaxone > Cefazolin.  No urinary issues at this time  Severe sepsis due to recurrent aspiration pneumonia - resolved  Completed course of IV Zosyn  Sinus tachycardia, resolved Thought to be to be multifactorial secondary to dehydration sepsis, pulmonary embolism.  On metoprolol.  Elevated liver enzymes Resolved at this time.  Hepatitis panel was negative.  Right upper quadrant ultrasound was unremarkable,  Hyponatremia - resolved   Tobacco use disorder Off nicotine patch.  Physical deconditioning Significant weakness in all extremities as a result of cervical myelopathy and acute illness.  Continue physical therapy, occupational therapy  Urinary retention Was initially on Foley catheter.  Currently able to urinate by himself.  Continue Flomax and finasteride.    Severe protein-calorie malnutrition, Continue with dietary supplements.  Thrombocytosis Has resolved.  Latest platelet count of 294.    Normocytic anemia Chronic and stable.  Patient received 1 PRBC during hospitalization.  Latest hemoglobin of 10.2  Hyperglycemia-resolved as of 07/12/2021. Likely due to Steroids. Hemoglobin A1C of 5.2%. Resolved.   Interval History:  No events overnight. Sitting in chair. Working on hand exercises.   Old records reviewed in assessment of this patient   DVT prophylaxis:  SCD's Start: 06/29/21 1231 apixaban (ELIQUIS) tablet 5 mg   Code Status:   Code Status: Full Code  Disposition Plan: Pending improved mobility to discharge home Status is: Inpatient  Objective: Blood pressure 105/67, pulse 85, temperature 97.8 F  (36.6 C), temperature source Oral, resp. rate 19, height 5\' 10"  (1.778 m), weight 53 kg, SpO2 99 %.  Examination:  Physical Exam Constitutional:      General: He is not in acute distress.    Appearance: Normal appearance.  HENT:     Head: Normocephalic and atraumatic.     Mouth/Throat:     Mouth: Mucous membranes are moist.  Eyes:     Extraocular Movements: Extraocular movements intact.  Cardiovascular:     Rate and Rhythm: Normal rate and regular rhythm.     Heart sounds: Normal heart sounds.  Pulmonary:     Effort: Pulmonary effort is normal. No respiratory distress.     Breath sounds: Normal breath sounds. No wheezing.  Abdominal:     General: Bowel sounds are normal. There is no distension.     Palpations: Abdomen is soft.     Tenderness: There is no abdominal tenderness.  Musculoskeletal:        General: Normal range of motion.     Cervical back: Normal range of motion and neck supple.  Skin:    General: Skin is warm and dry.  Neurological:     General: No focal deficit present.     Mental Status: He is alert.     Comments: Mild weakness appreciated with handgrip  Psychiatric:        Mood and Affect: Mood normal.        Behavior: Behavior normal.        LOS: 125 days   Dwyane Dee, MD [facility] 11/01/2021, 2:38 PM

## 2021-11-01 NOTE — Progress Notes (Signed)
   Providing Compassionate, Quality Care - Together   Subjective: Patient reports he is ready to go home to his apartment. He reports the pain in his hands is improving somewhat.  Objective: Vital signs in last 24 hours: Temp:  [97.8 F (36.6 C)-98.3 F (36.8 C)] 97.8 F (36.6 C) (06/08 0757) Pulse Rate:  [81-85] 85 (06/08 0757) Resp:  [19-20] 19 (06/08 0757) BP: (104-105)/(67-78) 105/67 (06/08 0757) SpO2:  [99 %] 99 % (06/08 0757)  Intake/Output from previous day: No intake/output data recorded. Intake/Output this shift: Total I/O In: -  Out: 350 [Urine:350]  Sitting up in chair Alert; oriented to person, place, and time MAE, Generalized weakness Decreased fine motor BUE Incision is well-healed    Lab Results: Recent Labs    10/30/21 0305  WBC 5.7  HGB 10.2*  HCT 32.2*  PLT 294   BMET Recent Labs    10/30/21 0305  NA 138  K 4.0  CL 107  CO2 26  GLUCOSE 90  BUN 17  CREATININE 0.99  CALCIUM 10.3    Studies/Results: No results found.  Assessment/Plan: Patient is doing well after a very complicated hospital course following four level ACDF. He would like to discharge home. Continue efforts at mobilization. Gabapentin and baclofen increased on Monday, 10/29/2021.      LOS: 125 days     Val Eagle, DNP, AGNP-C Nurse Practitioner  [facility] [address], [address], [place], [place] [address] P: [phone]    F: [phone]  11/01/2021, 11:53 PM

## 2021-11-02 ENCOUNTER — Other Ambulatory Visit (HOSPITAL_COMMUNITY): Payer: Self-pay

## 2021-11-02 MED ORDER — GABAPENTIN 300 MG PO CAPS
300.0000 mg | ORAL_CAPSULE | Freq: Three times a day (TID) | ORAL | 0 refills | Status: AC
  Filled 2021-11-02: qty 90, 30d supply, fill #0

## 2021-11-02 MED ORDER — APIXABAN 5 MG PO TABS
5.0000 mg | ORAL_TABLET | Freq: Two times a day (BID) | ORAL | 0 refills | Status: DC
  Filled 2021-11-02: qty 60, 30d supply, fill #0

## 2021-11-02 MED ORDER — BACLOFEN 10 MG PO TABS
25.0000 mg | ORAL_TABLET | Freq: Three times a day (TID) | ORAL | 0 refills | Status: DC
  Filled 2021-11-02: qty 45, 6d supply, fill #0

## 2021-11-02 MED ORDER — MELATONIN 3 MG PO TABS
3.0000 mg | ORAL_TABLET | Freq: Every day | ORAL | 0 refills | Status: DC
  Filled 2021-11-02: qty 30, 30d supply, fill #0

## 2021-11-02 MED ORDER — TAMSULOSIN HCL 0.4 MG PO CAPS
0.8000 mg | ORAL_CAPSULE | Freq: Every day | ORAL | 0 refills | Status: DC
Start: 2021-11-02 — End: 2022-06-24
  Filled 2021-11-02: qty 30, 15d supply, fill #0

## 2021-11-02 MED ORDER — FINASTERIDE 5 MG PO TABS
5.0000 mg | ORAL_TABLET | Freq: Every day | ORAL | 0 refills | Status: DC
  Filled 2021-11-02: qty 30, 30d supply, fill #0

## 2021-11-02 MED ORDER — HYDROCODONE-ACETAMINOPHEN 5-325 MG PO TABS
1.0000 | ORAL_TABLET | Freq: Four times a day (QID) | ORAL | 0 refills | Status: DC | PRN
  Filled 2021-11-02: qty 30, 8d supply, fill #0

## 2021-11-02 MED ORDER — ADULT MULTIVITAMIN W/MINERALS CH
1.0000 | ORAL_TABLET | Freq: Every day | ORAL | 0 refills | Status: DC
  Filled 2021-11-02: qty 30, 30d supply, fill #0

## 2021-11-02 MED ORDER — BOOST PLUS PO LIQD
237.0000 mL | Freq: Three times a day (TID) | ORAL | 0 refills | Status: DC
  Filled 2021-11-02: qty 90, 1d supply, fill #0

## 2021-11-02 NOTE — Progress Notes (Signed)
Progress Note    Brian [name]   NID:[id number]  DOB: 1961-03-29  DOA: 06/29/2021     126 PCP: Pcp, No  Initial CC: neck pain  Hospital Course: Brian [name] is a 61 year old male with past medical history of tobacco and alcohol use was admitted by neurosurgery for cervical myelopathy due to critical multilevel cervical spinal canal stenosis at C3-C6 with spinal cord compression.  TRH on board for medical assistance. Awaiting placement/improvement with mobility to be discharged home.  Assessment and Plan:  Principal Problem:   Cervical myelopathy (HCC); severe cervical spinal stenosis with cord compression s/p ACDF C3-7 on 2/3, complicated by epidural hematoma s/p evacuation 2/7 Active Problems:   Acute respiratory failure with hypoxia (HCC)   Acute pulmonary embolism (HCC)   Acute metabolic encephalopathy   Severe sepsis due to recurrent aspiration pneumonia   E. coli bacteremia   Sinus tachycardia   Elevated liver enzymes   Hyponatremia   Tobacco use disorder   Physical deconditioning   Urinary retention   Protein-calorie malnutrition, severe (HCC)   Lumbar back pain   Fall during current hospitalization   Normocytic anemia   Thrombocytosis   Citrobacter infection   Vomiting   Abnormal LFTs   Aspiration pneumonia (HCC)  Cervical myelopathy (HCC); severe cervical spinal stenosis with cord compression s/p ACDF C3-7 on 2/3, complicated by epidural hematoma s/p evacuation 2/7 - awaiting further improvement in mobility as patient lives alone   Right hand pain Continue gabapentin and brace.  Acute respiratory failure with hypoxia (HCC) - resolved  Had recurrent aspiration pneumonia during hospitalization.  Was intubated and successfully extubated during hospitalization.  Has completed course of antibiotic for aspiration pneumonia.  Currently on room air.  Appears stable at this time.  Acute pulmonary embolism (HCC) CTA chest (2/7) showed bilateral segmental PE. LE  [place] negative for DVT.  TTE with normal LV systolic function.   - Continue Eliquis for anticoagulation..  Acute metabolic encephalopathy Improved and at baseline.  CT head, MRI brain, EEG, TSH, B12, ammonia was unremarkable.   E. coli bacteremia - resolved  Resolved, urine culture and blood cultures x2 08/17/2021 positive for E. coli. Completed 7-day course of Ceftriaxone > Cefazolin.  No urinary issues at this time  Severe sepsis due to recurrent aspiration pneumonia - resolved  Completed course of IV Zosyn  Sinus tachycardia, resolved Thought to be to be multifactorial secondary to dehydration sepsis, pulmonary embolism.  On metoprolol.  Elevated liver enzymes Resolved at this time.  Hepatitis panel was negative.  Right upper quadrant ultrasound was unremarkable,  Hyponatremia - resolved   Tobacco use disorder Off nicotine patch.  Physical deconditioning Significant weakness in all extremities as a result of cervical myelopathy and acute illness.  Continue physical therapy, occupational therapy  Urinary retention Was initially on Foley catheter.  Currently able to urinate by himself.  Continue Flomax and finasteride.    Severe protein-calorie malnutrition, Continue with dietary supplements.  Thrombocytosis Has resolved.  Latest platelet count of 294.    Normocytic anemia Chronic and stable.  Patient received 1 PRBC during hospitalization.  Latest hemoglobin of 10.2  Hyperglycemia-resolved as of 07/12/2021. Likely due to Steroids. Hemoglobin A1C of 5.2%. Resolved.   Interval History:  No events overnight. Appears he might go home today per SW.   Old records reviewed in assessment of this patient   DVT prophylaxis:  SCD's Start: 06/29/21 1231 apixaban (ELIQUIS) tablet 5 mg   Code Status:   Code Status: Full  Code  Disposition Plan: Pending improved mobility to discharge home Status is: Inpatient  Objective: Blood pressure 105/79, pulse 83, temperature 97.8 F (36.6  C), temperature source Oral, resp. rate 18, height 5\' 10"  (1.778 m), weight 53 kg, SpO2 100 %.  Examination:  Physical Exam Constitutional:      General: He is not in acute distress.    Appearance: Normal appearance.  HENT:     Head: Normocephalic and atraumatic.     Mouth/Throat:     Mouth: Mucous membranes are moist.  Eyes:     Extraocular Movements: Extraocular movements intact.  Cardiovascular:     Rate and Rhythm: Normal rate and regular rhythm.     Heart sounds: Normal heart sounds.  Pulmonary:     Effort: Pulmonary effort is normal. No respiratory distress.     Breath sounds: Normal breath sounds. No wheezing.  Abdominal:     General: Bowel sounds are normal. There is no distension.     Palpations: Abdomen is soft.     Tenderness: There is no abdominal tenderness.  Musculoskeletal:        General: Normal range of motion.     Cervical back: Normal range of motion and neck supple.  Skin:    General: Skin is warm and dry.  Neurological:     General: No focal deficit present.     Mental Status: He is alert.     Comments: Mild weakness appreciated with handgrip  Psychiatric:        Mood and Affect: Mood normal.        Behavior: Behavior normal.        LOS: 126 days   , MD [facility] 11/02/2021, 12:15 PM

## 2021-11-02 NOTE — Discharge Summary (Signed)
Physician Discharge Summary     Providing Compassionate, Quality Care - Together   Patient ID: Brian [name] MRN: [id number] DOB/AGE: April 02, 1961 61 y.o.  Admit date: 06/29/2021 Discharge date: 11/02/2021  Admission Diagnoses: Cervical myelopathy  Discharge Diagnoses:  Principal Problem:   Cervical myelopathy (HCC); severe cervical spinal stenosis with cord compression s/p ACDF C3-7 on 2/3, complicated by epidural hematoma s/p evacuation 2/7 Active Problems:   Protein-calorie malnutrition, severe (HCC)   Tobacco use disorder   Physical deconditioning   Sinus tachycardia   Acute pulmonary embolism (HCC)   Acute respiratory failure with hypoxia (HCC)   Urinary retention   Lumbar back pain   Fall during current hospitalization   Normocytic anemia   Aspiration pneumonia (HCC)   Discharged Condition: good  Hospital Course: Patient underwent a C3-4, C4-5, C5-6, C6-7 anterior cervical discectomy with interbody fusion by Dr. Jordan Likes on 06/29/2021. He underwent exploration of his cervical fusion with evacuation of an epidural hematoma on 07/03/2021. His postoperative course has been complicated by numerous medical issues. He has worked with both physical and occupational therapies who felt the patient would benefit from inpatient rehab. Unfortunately, the patient's insurance would not cover CIR or SNF. Patient was offered a [facility] at [facility] but would not take the Covid vaccine required for admission. He continued to work with therapies as an inpatient at [facility]. He  is ambulating with the aid of a walker. He is tolerating a normal diet. He is not having any bowel or bladder dysfunction. His pain is well-controlled with oral pain medication. He is ready for discharge home with home health. His son will be able to offer intermittent assistance to the patient at home as well.   Consults: rehabilitation medicine  Significant Diagnostic Studies: radiology:   EXAM: MRI CERVICAL  SPINE WITHOUT CONTRAST IMPRESSION: 1. Postoperative changes of recent C3-C7 ACDF. Ventral epidural hemorrhage/fluid extending from C2 inferiorly to C7 and extending into the postoperative disc spaces at these levels. Resulting severe canal stenosis at these levels with compression of the cord and possible cord edema, detailed above. 2. Superimposed multilevel degenerative change with severe multilevel foraminal stenosis. 3. Prevertebral soft tissue swelling better characterized on recent CTA.   These results will be called to the ordering clinician or representative by the Radiologist Assistant, and communication documented in the PACS or [employer].     Electronically Signed   By: Feliberto Harts M.D.   On: 07/03/2021 13:02   EXAM: MRI CERVICAL SPINE WITHOUT CONTRAST IMPRESSION: 1. Advanced degenerative change throughout the cervical spine detailed above resulting in severe spinal canal stenosis at C3-C4 and C4-C5 and moderate to severe spinal canal stenosis at C5-C6 with cord compression. There is dorsal cord signal abnormality extending from C3 through C5-C6 most likely reflecting compressive edema and/or myelomalacia. 2. Moderate spinal canal stenosis with mild mass effect on the cord at C6-C7. 3. Extensive severe bilateral neural foraminal stenosis throughout the cervical spine. 4. Facet arthropathy with perifacetal edema on the left at C2-C3 and on the right at C3-C4. While this finding is most likely degenerative in nature, infection with septic arthritis can not be entirely excluded by imaging. Correlate with symptoms and lab values.     Electronically Signed   By: Lesia Hausen M.D.   On: 06/19/2021 16:12  Treatments: surgery:  06/29/2021: C3-4, C4-5, C5-6, C6-7 anterior cervical discectomy with interbody fusion utilizing interbody cages, local harvested autograft, and anterior plate instrumentation  07/03/2021:  Reexploration anterior cervical fusion  with  evacuation of epidural hematoma  Discharge Exam: Blood pressure 105/79, pulse 83, temperature 97.8 F (36.6 C), temperature source Oral, resp. rate 18, height 5\' 10"  (1.778 m), weight 53 kg, SpO2 100 %.  Sitting up in chair Alert; oriented to person, place, and time MAE, Generalized weakness Decreased fine motor BUE Incision is well-healed  Disposition: Discharge disposition: 06-Home-Health Care Svc        Allergies as of 11/02/2021   No Known Allergies      Medication List     STOP taking these medications    methylPREDNISolone 4 MG Tbpk tablet Commonly known as: MEDROL DOSEPAK   naproxen sodium 220 MG tablet Commonly known as: ALEVE       TAKE these medications    apixaban 5 MG Tabs tablet Commonly known as: ELIQUIS Take 1 tablet (5 mg total) by mouth 2 (two) times daily.   Baclofen 5 MG Tabs Take 25 mg by mouth 3 (three) times daily.   finasteride 5 MG tablet Commonly known as: PROSCAR Take 1 tablet (5 mg total) by mouth daily. Start taking on: November 03, 2021   gabapentin 300 MG capsule Commonly known as: NEURONTIN Take 1 capsule (300 mg total) by mouth 3 (three) times daily.   HYDROcodone-acetaminophen 5-325 MG tablet Commonly known as: NORCO/VICODIN Take 1 tablet by mouth every 6 (six) hours as needed for moderate pain.   lactose free nutrition Liqd Take 237 mLs by mouth 3 (three) times daily with meals.   melatonin 3 MG Tabs tablet Take 1 tablet (3 mg total) by mouth at bedtime.   multivitamin with minerals Tabs tablet Take 1 tablet by mouth daily. Start taking on: November 03, 2021   tamsulosin 0.4 MG Caps capsule Commonly known as: FLOMAX Take 2 capsules (0.8 mg total) by mouth daily after supper.               Durable Medical Equipment  (From admission, onward)           Start     Ordered   11/01/21 1428  For home use only DME Walker rolling  Once       Question Answer Comment  Walker: With 5 Inch Wheels   Patient needs a  walker to treat with the following condition Generalized muscle weakness   Patient needs a walker to treat with the following condition Cervical spinal stenosis      11/01/21 1427            Follow-up Information     [facility] Follow up.   Specialty: Rehabilitation Why: [facility] will be providing physical therapy at the home.  They will call you within 24-48 hours of your discharge home to set up services. Contact information: [place]. [place], [place] [facility] Follow up.   Why: [employer] will be providing a rolling walker to the hospital prior to your discharge home. Contact information: [address] [place] [address] [phone]         [place]. Go on 01/07/2022.   Specialty: Family Medicine Why: You are scheduled for a hospital follow up on January 07, 2022 at 1:40 pm. Contact information: [place] [phone]        Earnie Larsson, MD. Schedule an appointment as soon as possible for a visit in 4 week(s).   Specialty:  Neurosurgery Contact information: [address] [place] [address] [phone]                 Signed: Viona Gilmore, DNP, AGNP-C Nurse Practitioner  [facility] [place] [address], [place], [place] [address] P: [phone]    F: [phone]  11/02/2021, 1:07 PM

## 2021-11-02 NOTE — Progress Notes (Addendum)
12:55pm: CSW spoke with patient's son who states he will arrive ti pick up his dad around 5pm today. NP and RN aware.  9am: CSW spoke with patient at bedside to inform him of discharge plan. The patient will discharge home today with a rolling walker and charity home health through [place]. Patient will receive a 30 day supply of medications from [facility] prior to discharge.  CSW attempted to reach patient's son to discuss transport - waiting for return call.  Unit staff aware of plan.  Edwin Dada, MSW, LCSW Transitions of Care  Clinical Social Worker II [phone]

## 2021-11-02 NOTE — Progress Notes (Signed)
   Providing Compassionate, Quality Care - Together   Subjective: Patient reports he is anxious about going home. CSW has reached out to the patient's son regarding discharge plan. Per her last conversation with the son yesterday, he is in agreement with the plan to discharge home today.  Objective: Vital signs in last 24 hours: Temp:  [97.8 F (36.6 C)-98.7 F (37.1 C)] 97.8 F (36.6 C) (06/09 0811) Pulse Rate:  [83] 83 (06/08 1945) Resp:  [18] 18 (06/09 0811) BP: (105-116)/(79) 105/79 (06/09 0811) SpO2:  [98 %-100 %] 100 % (06/09 0811)  Intake/Output from previous day: 06/08 0701 - 06/09 0700 In: -  Out: 650 [Urine:650] Intake/Output this shift: No intake/output data recorded.  Sitting up in chair Alert; oriented to person, place, and time MAE, Generalized weakness Decreased fine motor BUE Incision is well-healed  Lab Results: No results for input(s): "WBC", "HGB", "HCT", "PLT" in the last 72 hours. BMET No results for input(s): "NA", "K", "CL", "CO2", "GLUCOSE", "BUN", "CREATININE", "CALCIUM" in the last 72 hours.  Studies/Results: No results found.  Assessment/Plan: Patient is doing well after a very complicated hospital course following four level ACDF. He would like to discharge home. Continue efforts at mobilization. Gabapentin and baclofen increased on Monday, 10/29/2021.    LOS: 126 days   -Plan is to discharge home later today.   Val Eagle, DNP, AGNP-C Nurse Practitioner  [facility] [address], [place], [place] [address] P: [phone]    F: [phone]  11/02/2021, 12:55 PM

## 2021-11-13 ENCOUNTER — Other Ambulatory Visit (HOSPITAL_COMMUNITY): Payer: Self-pay

## 2021-11-14 ENCOUNTER — Other Ambulatory Visit (HOSPITAL_COMMUNITY): Payer: Self-pay

## 2021-11-16 ENCOUNTER — Other Ambulatory Visit (HOSPITAL_COMMUNITY): Payer: Self-pay

## 2021-11-19 ENCOUNTER — Other Ambulatory Visit (HOSPITAL_COMMUNITY): Payer: Self-pay

## 2021-11-20 ENCOUNTER — Telehealth (HOSPITAL_BASED_OUTPATIENT_CLINIC_OR_DEPARTMENT_OTHER): Payer: Self-pay

## 2021-11-20 ENCOUNTER — Other Ambulatory Visit (HOSPITAL_BASED_OUTPATIENT_CLINIC_OR_DEPARTMENT_OTHER): Payer: Self-pay

## 2021-11-21 ENCOUNTER — Telehealth (HOSPITAL_COMMUNITY): Payer: Self-pay

## 2021-11-21 NOTE — Telephone Encounter (Signed)
Transitions of Care Pharmacy   Call attempted for a pharmacy transitions of care follow-up. HIPAA appropriate voicemail was left with call back information provided.   Call attempt #2. Will follow-up in 2-3 days.    

## 2021-11-22 ENCOUNTER — Other Ambulatory Visit (HOSPITAL_BASED_OUTPATIENT_CLINIC_OR_DEPARTMENT_OTHER): Payer: Self-pay

## 2021-11-22 ENCOUNTER — Telehealth (HOSPITAL_BASED_OUTPATIENT_CLINIC_OR_DEPARTMENT_OTHER): Payer: Self-pay

## 2021-11-22 NOTE — Telephone Encounter (Signed)
Transitions of Care Pharmacy   Call attempted for a pharmacy transitions of care follow-up. HIPAA appropriate voicemail was left with call back information provided.   Call attempt #3. Final attempt.  Jiles Crocker, PharmD Clinical Pharmacist Med [facility] 11/22/2021 9:10 AM

## 2021-11-30 ENCOUNTER — Other Ambulatory Visit (HOSPITAL_COMMUNITY): Payer: Self-pay

## 2021-12-04 ENCOUNTER — Other Ambulatory Visit (HOSPITAL_COMMUNITY): Payer: Self-pay

## 2021-12-04 ENCOUNTER — Other Ambulatory Visit: Payer: Self-pay

## 2021-12-04 MED ORDER — APIXABAN 5 MG PO TABS
ORAL_TABLET | ORAL | 1 refills | Status: DC
  Filled 2021-12-04: qty 60, 30d supply, fill #0

## 2021-12-05 ENCOUNTER — Other Ambulatory Visit (HOSPITAL_COMMUNITY): Payer: Self-pay

## 2021-12-05 ENCOUNTER — Other Ambulatory Visit: Payer: Self-pay

## 2021-12-06 ENCOUNTER — Other Ambulatory Visit (HOSPITAL_COMMUNITY): Payer: Self-pay

## 2021-12-18 ENCOUNTER — Other Ambulatory Visit (HOSPITAL_BASED_OUTPATIENT_CLINIC_OR_DEPARTMENT_OTHER): Payer: Self-pay

## 2021-12-29 ENCOUNTER — Emergency Department (HOSPITAL_COMMUNITY)
Admission: EM | Admit: 2021-12-29 | Discharge: 2021-12-30 | Disposition: A | Discharge status: Home / Self Care | Payer: Medicaid Other | Attending: Emergency Medicine | Admitting: Emergency Medicine

## 2021-12-29 DIAGNOSIS — M545 Low back pain, unspecified: Secondary | ICD-10-CM | POA: Diagnosis present

## 2021-12-29 DIAGNOSIS — Z7901 Long term (current) use of anticoagulants: Secondary | ICD-10-CM | POA: Insufficient documentation

## 2021-12-29 DIAGNOSIS — G8929 Other chronic pain: Secondary | ICD-10-CM

## 2021-12-29 LAB — CBC
HCT: 37.5 % — ABNORMAL LOW (ref 39.0–52.0)
Hemoglobin: 12.7 g/dL — ABNORMAL LOW (ref 13.0–17.0)
MCH: 30.6 pg (ref 26.0–34.0)
MCHC: 33.9 g/dL (ref 30.0–36.0)
MCV: 90.4 fL (ref 80.0–100.0)
Platelets: 306 10*3/uL (ref 150–400)
RBC: 4.15 MIL/uL — ABNORMAL LOW (ref 4.22–5.81)
RDW: 17.3 % — ABNORMAL HIGH (ref 11.5–15.5)
WBC: 12.5 10*3/uL — ABNORMAL HIGH (ref 4.0–10.5)
nRBC: 0 % (ref 0.0–0.2)

## 2021-12-29 LAB — BASIC METABOLIC PANEL
Anion gap: 6 (ref 5–15)
BUN: 16 mg/dL (ref 6–20)
CO2: 27 mmol/L (ref 22–32)
Calcium: 10.4 mg/dL — ABNORMAL HIGH (ref 8.9–10.3)
Chloride: 108 mmol/L (ref 98–111)
Creatinine, Ser: 0.87 mg/dL (ref 0.61–1.24)
GFR, Estimated: >60 mL/min (ref >60)
Glucose, Bld: 105 mg/dL — ABNORMAL HIGH (ref 70–99)
Potassium: 4.4 mmol/L (ref 3.5–5.1)
Sodium: 141 mmol/L (ref 135–145)

## 2021-12-29 LAB — CK: Total CK: 260 U/L (ref 49–397)

## 2021-12-29 MED ORDER — IBUPROFEN 400 MG PO TABS
400.0000 mg | ORAL_TABLET | Freq: Once | ORAL | Status: AC
  Administered 2021-12-29: 400 mg via ORAL
  Filled 2021-12-29: qty 1

## 2021-12-29 MED ORDER — ACETAMINOPHEN 325 MG PO TABS
650.0000 mg | ORAL_TABLET | Freq: Once | ORAL | Status: AC
  Administered 2021-12-29: 650 mg via ORAL
  Filled 2021-12-29: qty 2

## 2021-12-29 NOTE — ED Provider Notes (Signed)
[facility] EMERGENCY DEPARTMENT Provider Note   CSN: [id number] Arrival date & time: 12/29/21  2104     History  Chief Complaint  Patient presents with   Back Pain    Brian [name] is a 61 y.o. male.  Patient is a 61 year old male with a past medical history of cervical myelopathy status post repair in February 2023 followed by prolonged hospitalization with discharge in June coming in today with back pain.  The patient states that he has had chronic back pain since the time of discharge and states that the pain has becoming more bearable.  He states that the pain is mostly in his right mid back.  He states that he has frequent falls but that his last fall was over a month ago.  He denies any numbness or weakness.  He denies any dysuria or hematuria, fevers or chills, nausea or vomiting.  He states he has a poor appetite.  He denies any saddle anesthesia, loss of bowel or bladder function.  He states he has not followed up with his surgeon since discharge.  He states that he has been taking his home medications but is unsure what he takes for pain.  The history is provided by the patient.  Back Pain      Home Medications Prior to Admission medications   Medication Sig Start Date End Date Taking? Authorizing Provider  apixaban (ELIQUIS) 5 MG TABS tablet take 1 tablet by mouth twice daily 12/04/21     baclofen (LIORESAL) 10 MG tablet Take 2.5 tablets (25 mg total) by mouth 3 (three) times daily. 11/02/21   Val Eagle D, NP  finasteride (PROSCAR) 5 MG tablet Take 1 tablet (5 mg total) by mouth daily. 11/03/21   Val Eagle D, NP  gabapentin (NEURONTIN) 300 MG capsule Take 1 capsule (300 mg total) by mouth 3 (three) times daily. 11/02/21   Val Eagle D, NP  HYDROcodone-acetaminophen (NORCO/VICODIN) 5-325 MG tablet Take 1 tablet by mouth every 6 (six) hours as needed for moderate pain. 11/02/21   Val Eagle D, NP  lactose free nutrition (BOOST PLUS) LIQD  Take 237 mLs by mouth 3 (three) times daily with meals. 11/02/21   Val Eagle D, NP  melatonin 3 MG TABS tablet Take 1 tablet (3 mg total) by mouth at bedtime. 11/02/21   Val Eagle D, NP  Multiple Vitamin (MULTIVITAMIN WITH MINERALS) TABS tablet Take 1 tablet by mouth daily. 11/03/21   Val Eagle D, NP  tamsulosin (FLOMAX) 0.4 MG CAPS capsule Take 2 capsules (0.8 mg total) by mouth daily after supper. 11/02/21   Val Eagle D, NP      Allergies    Patient has no known allergies.    Review of Systems   Review of Systems  Musculoskeletal:  Positive for back pain.    Physical Exam Updated Vital Signs BP 115/78   Pulse 60   Temp 97.9 F (36.6 C) (Oral)   Resp 16   SpO2 100%  Physical Exam Vitals and nursing note reviewed.  Constitutional:      Comments: Chronically ill-appearing, frail, uncomfortable appearing  HENT:     Head: Normocephalic and atraumatic.     Mouth/Throat:     Mouth: Mucous membranes are moist.  Eyes:     Extraocular Movements: Extraocular movements intact.     Conjunctiva/sclera: Conjunctivae normal.     Pupils: Pupils are equal, round, and reactive to light.  Neck:     Comments: No midline  neck tenderness Cardiovascular:     Rate and Rhythm: Normal rate and regular rhythm.  Pulmonary:     Effort: Pulmonary effort is normal.     Breath sounds: Normal breath sounds.  Abdominal:     General: Abdomen is flat.     Palpations: Abdomen is soft.     Tenderness: There is left CVA tenderness. There is no right CVA tenderness.  Musculoskeletal:     Cervical back: Normal range of motion and neck supple.     Comments: No midline back tenderness, tenderness to palpation of left-sided mid thoracic paraspinal muscles, no overlying skin changes  Skin:    General: Skin is warm and dry.  Neurological:     General: No focal deficit present.     Mental Status: He is alert and oriented to person, place, and time.     Sensory: No sensory deficit.     Motor:  No weakness.  Psychiatric:        Mood and Affect: Mood normal.        Behavior: Behavior normal.     ED Results / Procedures / Treatments   Labs (all labs ordered are listed, but only abnormal results are displayed) Labs Reviewed  CBC - Abnormal; Notable for the following components:      Result Value   WBC 12.5 (*)    RBC 4.15 (*)    Hemoglobin 12.7 (*)    HCT 37.5 (*)    RDW 17.3 (*)    All other components within normal limits  BASIC METABOLIC PANEL  CK  URINALYSIS, ROUTINE W REFLEX MICROSCOPIC    EKG None  Radiology No results found.  Procedures Procedures    Medications Ordered in ED Medications  acetaminophen (TYLENOL) tablet 650 mg (650 mg Oral Given 12/29/21 2211)  ibuprofen (ADVIL) tablet 400 mg (400 mg Oral Given 12/29/21 2211)    ED Course/ Medical Decision Making/ A&P Clinical Course as of 12/29/21 2339  Sat Dec 29, 2021  2315 Labs reviewed and interpreted by myself show a mildly elevated calcium of 10.4, electrolytes are otherwise within normal range.  CK is normal making rhabdo unlikely.  He has a mildly elevated white count of 12.5.  Urine is pending to evaluate for possible UTI. [VK]  2339 Patient signed out to Dr. Blinda Leatherwood in stable condition pending UA with plan for likely discharge home. [VK]    Clinical Course User Index [VK] Phoebe Sharps, DO                           Medical Decision Making Patient is a 61 year old male with a past medical history of cervical myelopathy status post repair in February 2023 followed by prolonged hospitalization with discharge in June coming in today with back pain.  Patient is uncomfortable appearing and does have left-sided CVA tenderness, concerning for possible pyelonephritis though he denies urinary symptoms.  He has no focal neurologic deficits, no midline neck or back tenderness, no fevers, no recent manipulation or trauma to his spine making spinal cord pathology unlikely cause of his symptoms.   Additionally concerned for rhabdo for muscle pain or electrolyte abnormality as he appears to be having muscle spasms.  He will have labs performed and will be given Tylenol and Motrin for pain and be reassessed.  Amount and/or Complexity of Data Reviewed Labs: ordered. Decision-making details documented in ED Course.  Risk OTC drugs. Prescription drug management.  Final Clinical Impression(s) / ED Diagnoses Final diagnoses:  None    Rx / DC Orders ED Discharge Orders     None         Phoebe Sharps, DO 12/29/21 2339

## 2021-12-29 NOTE — ED Triage Notes (Addendum)
Pt transported from home by [facility] for back pain, s/p spinal surgery 71mo ago. Denies recent trauma, pt able to stand and pivot for EMS. No IV or meds given by EMS

## 2021-12-30 ENCOUNTER — Other Ambulatory Visit: Payer: Self-pay

## 2021-12-30 ENCOUNTER — Encounter (HOSPITAL_COMMUNITY): Payer: Self-pay

## 2021-12-30 LAB — URINALYSIS, ROUTINE W REFLEX MICROSCOPIC
Bacteria, UA: NONE SEEN
Bilirubin Urine: NEGATIVE
Glucose, UA: NEGATIVE mg/dL
Hgb urine dipstick: NEGATIVE
Ketones, ur: NEGATIVE mg/dL
Leukocytes,Ua: NEGATIVE
Nitrite: NEGATIVE
Protein, ur: 100 mg/dL — AB
Specific Gravity, Urine: 1.024 (ref 1.005–1.030)
pH: 7 (ref 5.0–8.0)

## 2021-12-30 NOTE — ED Notes (Signed)
Pt stated that son has key. Called son left a message to meet pt at home with key. Had to leave message bc son gets off at 0200. Pt aware

## 2021-12-30 NOTE — ED Provider Notes (Signed)
Patient signed out to me with urinalysis pending.  Patient with what sounds like chronic back pain presents with increasing pain in the right mid back.  Urinalysis was pending to evaluate for possible infectious etiology of his pain.  Urinalysis does not show any concerning signs of infection.  Patient resting comfortably upon repeat evaluation.  He is appropriate for discharge, needs to follow-up with his spinal surgeon.   Gilda Crease, MD 12/30/21 0127

## 2022-01-07 ENCOUNTER — Ambulatory Visit (INDEPENDENT_AMBULATORY_CARE_PROVIDER_SITE_OTHER): Payer: Self-pay | Admitting: Primary Care

## 2022-01-08 ENCOUNTER — Other Ambulatory Visit: Payer: Self-pay

## 2022-05-16 ENCOUNTER — Telehealth (INDEPENDENT_AMBULATORY_CARE_PROVIDER_SITE_OTHER): Payer: 59 | Admitting: Primary Care

## 2022-05-16 ENCOUNTER — Ambulatory Visit (INDEPENDENT_AMBULATORY_CARE_PROVIDER_SITE_OTHER): Payer: Self-pay

## 2022-05-16 NOTE — Telephone Encounter (Signed)
  Chief Complaint: fall  Symptoms: no injuries, just severe leg and hand pain  Frequency: yesterday, pain has been getting worse since surgery  Pertinent Negatives: Patient denies injuries Disposition: [] ED /[] Urgent Care (no appt availability in office) / [x] Appointment(In office/virtual)/ []  [facility]/ [] Home Care/ [] Refused Recommended Disposition /[] [facility]/ []  Follow-up with PCP Additional Notes: pt states he fell yesterday and unable to get up so took him approx 6 hours to get off the floor and has been laying in bed since. Since he had surgery and dc'd from hosp his weakness and pain has gotten worse. Pt is wanting to go to inpatient rehab d/t needing help and lives alone. Pt unable to make appt today for Hosp FU. Offered to change to VV but pt doesn't have video access so asked could do telephone visit, unable to change appt so advised pt I would call FC once they back from lunch and give him a call back. Pt verbalized understanding.   Reason for Disposition  [1] MODERATE weakness (i.e., interferes with work, school, normal activities) AND [2] new-onset or worsening  Answer Assessment - Initial Assessment Questions 1. MECHANISM: "How did the fall happen?"     Just weakness 3. ONSET: "When did the fall happen?" (e.g., minutes, hours, or days ago)     yesterday 4. LOCATION: "What part of the body hit the ground?" (e.g., back, buttocks, head, hips, knees, hands, head, stomach)     No injuries 5. INJURY: "Did you hurt (injure) yourself when you fell?" If Yes, ask: "What did you injure? Tell me more about this?" (e.g., body area; type of injury; pain severity)"     No injuries  6. PAIN: "Is there any pain?" If Yes, ask: "How bad is the pain?" (e.g., Scale 1-10; or mild,  moderate, severe)   - NONE (0): No pain   - MILD (1-3): Doesn't interfere with normal activities    - MODERATE (4-7): Interferes with normal activities or awakens from sleep    - SEVERE  (8-10): Excruciating pain, unable to do any normal activities      Legs and hands, constant 9. OTHER SYMPTOMS: "Do you have any other symptoms?" (e.g., dizziness, fever, weakness; new onset or worsening).      weakness  Protocols used: Falls and [facility]

## 2022-05-16 NOTE — Telephone Encounter (Signed)
Called pt back to let him know I spoke with Posey Rea, [facility] who was able to switch appt to telephone visit today at 1430.

## 2022-05-16 NOTE — Progress Notes (Deleted)
[facility]  Telephone Note  I connected with CAYDEN GRANHOLM, on 05/16/2022 at 5:37 PM  by ***telephone and verified that I am speaking with the correct person using two identifiers.   Consent: I discussed the limitations, risks, security and privacy concerns of performing an evaluation and management service by telephone and the availability of in person appointments. I also discussed with the patient that there may be a patient responsible charge related to this service. The patient expressed understanding and agreed to proceed.   Location of Patient: Home   Location of Provider: [facility]   Persons participating in Telemedicine visit: HAYDEN KIHARA Gwinda Passe,  NP   History of Present Illness: Mr. Keltin Baird is a 61 year old male having a tele visit due to a fall yesterday and laid in the floor 6 hours unable to move. States he called Dr. Jordan Likes and his assistant told him to go to the ED. He did no go. He states he falls frequently and they are aware. He is unable walk with out a walker or stand for periods of time.   No past medical history on file. No Known Allergies  Current Outpatient Medications on File Prior to Visit  Medication Sig Dispense Refill   apixaban (ELIQUIS) 5 MG TABS tablet take 1 tablet by mouth twice daily 60 tablet 1   baclofen (LIORESAL) 10 MG tablet Take 2.5 tablets (25 mg total) by mouth 3 (three) times daily. 45 tablet 0   finasteride (PROSCAR) 5 MG tablet Take 1 tablet (5 mg total) by mouth daily. 30 tablet 0   gabapentin (NEURONTIN) 300 MG capsule Take 1 capsule (300 mg total) by mouth 3 (three) times daily. 90 capsule 0   HYDROcodone-acetaminophen (NORCO/VICODIN) 5-325 MG tablet Take 1 tablet by mouth every 6 (six) hours as needed for moderate pain. 30 tablet 0   lactose free nutrition (BOOST PLUS) LIQD Take 237 mLs by mouth 3 (three) times daily with meals. 90 mL 0    melatonin 3 MG TABS tablet Take 1 tablet (3 mg total) by mouth at bedtime. 30 tablet 0   Multiple Vitamin (MULTIVITAMIN WITH MINERALS) TABS tablet Take 1 tablet by mouth daily. 30 tablet 0   tamsulosin (FLOMAX) 0.4 MG CAPS capsule Take 2 capsules (0.8 mg total) by mouth daily after supper. 30 capsule 0   No current facility-administered medications on file prior to visit.    Observations/Objective: Comprehensive ROS Pertinent positive and negative noted in HPI    Assessment and Plan: ***  Follow Up Instructions: ***   I discussed the assessment and treatment plan with the patient. The patient was provided an opportunity to ask questions and all were answered. The patient agreed with the plan and demonstrated an understanding of the instructions.   The patient was advised to call back or seek an in-person evaluation if the symptoms worsen or if the condition fails to improve as anticipated.     I provided *** minutes total of non-face-to-face time during this encounter including median intraservice time, reviewing previous notes, investigations, ordering medications, medical decision making, coordinating care and patient verbalized understanding at the end of the visit.    This note has been created with Education officer, environmental. Any transcriptional errors are unintentional.   Grayce Sessions, NP 05/16/2022, 5:37 PM  Subjective:   MARLAND REINE is a 61 y.o. male presents for hospital follow up  and establish care. Admit date to the hospital was 12/29/21, patient was discharged from the hospital on 12/30/21, patient was admitted for:   No past medical history on file.   No Known Allergies    Current Outpatient Medications on File Prior to Visit  Medication Sig Dispense Refill   apixaban (ELIQUIS) 5 MG TABS tablet take 1 tablet by mouth twice daily 60 tablet 1   baclofen (LIORESAL) 10 MG tablet Take 2.5 tablets (25 mg total) by mouth 3  (three) times daily. 45 tablet 0   finasteride (PROSCAR) 5 MG tablet Take 1 tablet (5 mg total) by mouth daily. 30 tablet 0   gabapentin (NEURONTIN) 300 MG capsule Take 1 capsule (300 mg total) by mouth 3 (three) times daily. 90 capsule 0   HYDROcodone-acetaminophen (NORCO/VICODIN) 5-325 MG tablet Take 1 tablet by mouth every 6 (six) hours as needed for moderate pain. 30 tablet 0   lactose free nutrition (BOOST PLUS) LIQD Take 237 mLs by mouth 3 (three) times daily with meals. 90 mL 0   melatonin 3 MG TABS tablet Take 1 tablet (3 mg total) by mouth at bedtime. 30 tablet 0   Multiple Vitamin (MULTIVITAMIN WITH MINERALS) TABS tablet Take 1 tablet by mouth daily. 30 tablet 0   tamsulosin (FLOMAX) 0.4 MG CAPS capsule Take 2 capsules (0.8 mg total) by mouth daily after supper. 30 capsule 0   No current facility-administered medications on file prior to visit.     Review of System: ROS  Objective:  There were no vitals taken for this visit.  There were no vitals filed for this visit.  Physical Exam:   General Appearance: Well nourished, in no apparent distress. Eyes: PERRLA, EOMs, conjunctiva no swelling or erythema Sinuses: No Frontal/maxillary tenderness ENT/Mouth: Ext aud canals clear, TMs without erythema, bulging. No erythema, swelling, or exudate on post pharynx.  Tonsils not swollen or erythematous. Hearing normal.  Neck: Supple, thyroid normal.  Respiratory: Respiratory effort normal, BS equal bilaterally without rales, rhonchi, wheezing or stridor.  Cardio: RRR with no MRGs. Brisk peripheral pulses without edema.  Abdomen: Soft, + BS.  Non tender, no guarding, rebound, hernias, masses. Lymphatics: Non tender without lymphadenopathy.  Musculoskeletal: Full ROM, 5/5 strength, normal gait.  Skin: Warm, dry without rashes, lesions, ecchymosis.  Neuro: Cranial nerves intact. Normal muscle tone, no cerebellar symptoms. Sensation intact.  Psych: Awake and oriented X 3, normal affect,  Insight and Judgment appropriate.    Assessment:   No diagnosis found.  No orders of the defined types were placed in this encounter.   This note has been created with Education officer, environmental. Any transcriptional errors are unintentional.   Grayce Sessions, NP 05/16/2022, 3:25 PM

## 2022-06-24 ENCOUNTER — Telehealth: Payer: Self-pay

## 2022-06-24 ENCOUNTER — Encounter (INDEPENDENT_AMBULATORY_CARE_PROVIDER_SITE_OTHER): Payer: Self-pay | Admitting: Primary Care

## 2022-06-24 ENCOUNTER — Ambulatory Visit (INDEPENDENT_AMBULATORY_CARE_PROVIDER_SITE_OTHER): Payer: Medicaid Other | Admitting: Primary Care

## 2022-06-24 VITALS — BP 125/87 | HR 87 | Resp 16 | Ht 70.0 in | Wt 125.6 lb

## 2022-06-24 DIAGNOSIS — R2681 Unsteadiness on feet: Secondary | ICD-10-CM

## 2022-06-24 DIAGNOSIS — D649 Anemia, unspecified: Secondary | ICD-10-CM

## 2022-06-24 DIAGNOSIS — E440 Moderate protein-calorie malnutrition: Secondary | ICD-10-CM

## 2022-06-24 DIAGNOSIS — Z59811 Housing instability, housed, with risk of homelessness: Secondary | ICD-10-CM

## 2022-06-24 DIAGNOSIS — R296 Repeated falls: Secondary | ICD-10-CM

## 2022-06-24 DIAGNOSIS — F329 Major depressive disorder, single episode, unspecified: Secondary | ICD-10-CM

## 2022-06-24 DIAGNOSIS — Z7689 Persons encountering health services in other specified circumstances: Secondary | ICD-10-CM

## 2022-06-24 DIAGNOSIS — Z13220 Encounter for screening for lipoid disorders: Secondary | ICD-10-CM | POA: Diagnosis not present

## 2022-06-24 DIAGNOSIS — Z114 Encounter for screening for human immunodeficiency virus [HIV]: Secondary | ICD-10-CM

## 2022-06-24 DIAGNOSIS — Z86711 Personal history of pulmonary embolism: Secondary | ICD-10-CM

## 2022-06-24 NOTE — Progress Notes (Signed)
[facility]   Subjective:   [name] is a 62 y.o. thin frame male presents for establish care.  He voices he would like to go to a nursing home.  He is behind on his rent and presents with a eviction notice.  He fell at work did not have [employer] because he was having a stroke which caused to fall and not related to his job.  He has used all of his savings and feels this is the only way to keep a roof over his head and food in his stomach.  Call clinical nurse manager to discuss please deliver she would forward the information to legal aid regarding no driving no income and eviction notice.  He does have a unsteady gait uses a cane and admits to frequent falls and lives alone.  No Known Allergies    Current Outpatient Medications on File Prior to Visit  Medication Sig Dispense Refill   apixaban (ELIQUIS) 5 MG TABS tablet take 1 tablet by mouth twice daily 60 tablet 1   baclofen (LIORESAL) 10 MG tablet Take 2.5 tablets (25 mg total) by mouth 3 (three) times daily. 45 tablet 0   gabapentin (NEURONTIN) 300 MG capsule Take 1 capsule (300 mg total) by mouth 3 (three) times daily. 90 capsule 0   finasteride (PROSCAR) 5 MG tablet Take 1 tablet (5 mg total) by mouth daily. (Patient not taking: Reported on 06/24/2022) 30 tablet 0   HYDROcodone-acetaminophen (NORCO/VICODIN) 5-325 MG tablet Take 1 tablet by mouth every 6 (six) hours as needed for moderate pain. (Patient not taking: Reported on 06/24/2022) 30 tablet 0   lactose free nutrition (BOOST PLUS) LIQD Take 237 mLs by mouth 3 (three) times daily with meals. (Patient not taking: Reported on 06/24/2022) 90 mL 0   melatonin 3 MG TABS tablet Take 1 tablet (3 mg total) by mouth at bedtime. (Patient not taking: Reported on 06/24/2022) 30 tablet 0   Multiple Vitamin (MULTIVITAMIN WITH MINERALS) TABS tablet Take 1 tablet by mouth daily. (Patient not taking: Reported on 06/24/2022) 30 tablet 0   tamsulosin (FLOMAX) 0.4 MG  CAPS capsule Take 2 capsules (0.8 mg total) by mouth daily after supper. (Patient not taking: Reported on 06/24/2022) 30 capsule 0   No current facility-administered medications on file prior to visit.     Review of System: Comprehensive ROS Pertinent positive and negative noted in HPI    Objective:  Blood Pressure 125/87   Pulse 87   Respiration 16   Height 5\' 10"  (1.778 m)   Weight 125 lb 9.6 oz (57 kg)   Oxygen Saturation 99%   Body Mass Index 18.02 kg/m   Filed Weights   06/24/22 1450  Weight: 125 lb 9.6 oz (57 kg)    Physical Exam: General Appearance: Well nourished, in no apparent distress. Eyes: PERRLA, EOMs, conjunctiva no swelling or erythema Sinuses: No Frontal/maxillary tenderness ENT/Mouth: Ext aud canals clear, TMs without erythema, bulging. No erythema, swelling, or exudate on post pharynx.  Tonsils not swollen or erythematous. Hearing normal.  Neck: Supple, thyroid normal.  Respiratory: Respiratory effort normal, BS equal bilaterally without rales, rhonchi, wheezing or stridor.  Cardio: RRR with no MRGs. Brisk peripheral pulses without edema.  Abdomen: Soft, + BS.  Non tender, no guarding, rebound, hernias, masses. Lymphatics: Non tender without lymphadenopathy.  Musculoskeletal: Full ROM, 5/5 strength, normal gait.  Skin: Warm, dry without rashes, lesions, ecchymosis.  Neuro: Cranial nerves intact. Normal muscle tone, no cerebellar symptoms.  Sensation intact.  Psych: Awake and oriented X 3, normal affect, Insight and Judgment appropriate.    Assessment:  Yago was seen today for new patient (initial visit).  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 Any transcriptional errors are unintentional.   Kerin Perna, NP 06/24/2022, 3:27 PM

## 2022-06-24 NOTE — Telephone Encounter (Signed)
I spoke to the patient when he was with Juluis Mire, NP at his appointment this afternoon.  He asked for placement in a rehab facility.  He said he could benefit from therapy as he still has difficulty walking. I explained to him that it might be difficult at this time to be accepted at a facility since he was discharged from the hospital 7 months ago.  I explained that home health PT could be ordered but there is no guarantee that an agency would accept the referral.  He then said he wants to go where there are machines and I suggested outpatient rehab. I also said that if he is in need of personal care assistance, a PCS referral can be placed.  A nurse would come to his home and assess his needs and determine if he qualifies for services and if so, determine the number of hours of help he would be eligible for.  He then said that he might be out of his home. He received an eviction notice and has a hearing scheduled for 07/03/2022 @ 1400, at [facility] in [place]. He went on to explain that he has been consistent with paying his rent for 9 years.  He was hospitalized in Feb 2023 and discharged from the hospital June 2023.  He was able to pay his rent from his savings but he has not been able to return to work. He is currently working with an attorney to apply for disability  In the meantime, he has run out of money, has no income and is 2 months behind in rent with is $900/month.   I explained to him that it sounds like he is interested in housing, not necessarily a skilled nursing/ rehab facility.  I offered to make a referral to [employer] of [facility]) to assist with addressing the eviction notice. He was in agreement to placing the referral. I told him that there is a chance [facility] will not be able to assist if there is any conflict of interest with the attorney that is working with him on the disability application and he said he understood. I confirmed his phone number and he then  said that his phone may be turned off tomorrow if his son is not able to assist him with paying his bill. He requested that [facility] contact his son, [name], if they are not able to reach him.  I then confirmed [name]' phone number.   The referral was then placed to [facility] via [place].

## 2022-06-25 LAB — CBC WITH DIFFERENTIAL/PLATELET
Basophils Absolute: 0 10*3/uL (ref 0.0–0.2)
Basos: 1 %
EOS (ABSOLUTE): 0.2 10*3/uL (ref 0.0–0.4)
Eos: 5 %
Hematocrit: 38.4 % (ref 37.5–51.0)
Hemoglobin: 12.8 g/dL — ABNORMAL LOW (ref 13.0–17.7)
Immature Grans (Abs): 0 10*3/uL (ref 0.0–0.1)
Immature Granulocytes: 0 %
Lymphocytes Absolute: 2.5 10*3/uL (ref 0.7–3.1)
Lymphs: 59 %
MCH: 31.9 pg (ref 26.6–33.0)
MCHC: 33.3 g/dL (ref 31.5–35.7)
MCV: 96 fL (ref 79–97)
Monocytes Absolute: 0.4 10*3/uL (ref 0.1–0.9)
Monocytes: 9 %
Neutrophils Absolute: 1.1 10*3/uL — ABNORMAL LOW (ref 1.4–7.0)
Neutrophils: 26 %
Platelets: 240 10*3/uL (ref 150–450)
RBC: 4.01 x10E6/uL — ABNORMAL LOW (ref 4.14–5.80)
RDW: 14 % (ref 11.6–15.4)
WBC: 4.3 10*3/uL (ref 3.4–10.8)

## 2022-06-25 LAB — CMP14+EGFR
ALT: 11 IU/L (ref 0–44)
AST: 28 IU/L (ref 0–40)
Albumin/Globulin Ratio: 1.2 (ref 1.2–2.2)
Albumin: 4.1 g/dL (ref 3.9–4.9)
Alkaline Phosphatase: 104 IU/L (ref 44–121)
BUN/Creatinine Ratio: 16 (ref 10–24)
BUN: 15 mg/dL (ref 8–27)
Bilirubin Total: 0.8 mg/dL (ref 0.0–1.2)
CO2: 22 mmol/L (ref 20–29)
Calcium: 9.7 mg/dL (ref 8.6–10.2)
Chloride: 103 mmol/L (ref 96–106)
Creatinine, Ser: 0.95 mg/dL (ref 0.76–1.27)
Globulin, Total: 3.4 g/dL (ref 1.5–4.5)
Glucose: 107 mg/dL — ABNORMAL HIGH (ref 70–99)
Potassium: 4.1 mmol/L (ref 3.5–5.2)
Sodium: 137 mmol/L (ref 134–144)
Total Protein: 7.5 g/dL (ref 6.0–8.5)
eGFR: 91 mL/min/{1.73_m2} (ref >59)

## 2022-06-25 LAB — LIPID PANEL
Chol/HDL Ratio: 2.9 ratio (ref 0.0–5.0)
Cholesterol, Total: 132 mg/dL (ref 100–199)
HDL: 46 mg/dL (ref >39)
LDL Chol Calc (NIH): 69 mg/dL (ref 0–99)
Triglycerides: 89 mg/dL (ref 0–149)
VLDL Cholesterol Cal: 17 mg/dL (ref 5–40)

## 2022-06-25 LAB — HIV ANTIBODY (ROUTINE TESTING W REFLEX): HIV Screen 4th Generation wRfx: NONREACTIVE

## 2022-06-27 ENCOUNTER — Telehealth: Payer: Self-pay | Admitting: Emergency Medicine

## 2022-06-27 ENCOUNTER — Telehealth (INDEPENDENT_AMBULATORY_CARE_PROVIDER_SITE_OTHER): Payer: Self-pay

## 2022-06-27 NOTE — Telephone Encounter (Signed)
Contacted pt to go over lab results pt is aware and doesn't have any questions or concerns

## 2022-06-27 NOTE — Telephone Encounter (Signed)
Copied from [place] [id number]. Topic: General - Inquiry >> Jun 27, 2022  3:08 PM Rosanne Ashing P wrote: Reason for CRM: pt called asking if Sharyn Lull would please call him back.  He did not disclose the reason.  CB#  [phone]

## 2022-08-27 ENCOUNTER — Encounter (INDEPENDENT_AMBULATORY_CARE_PROVIDER_SITE_OTHER): Payer: Self-pay | Admitting: Primary Care

## 2022-08-27 ENCOUNTER — Ambulatory Visit (INDEPENDENT_AMBULATORY_CARE_PROVIDER_SITE_OTHER): Payer: Medicaid Other | Admitting: Primary Care

## 2022-08-27 VITALS — BP 125/76 | HR 108 | Resp 16 | Ht 70.0 in | Wt 128.2 lb

## 2022-08-27 DIAGNOSIS — Z0289 Encounter for other administrative examinations: Secondary | ICD-10-CM

## 2022-08-27 DIAGNOSIS — E440 Moderate protein-calorie malnutrition: Secondary | ICD-10-CM

## 2022-08-27 DIAGNOSIS — Z0130 Encounter for examination of blood pressure without abnormal findings: Secondary | ICD-10-CM

## 2022-08-27 NOTE — Progress Notes (Signed)
  [facility]  Brian [name], is a 62 y.o. male  GEZ:[id number]  YTK:[id number]  DOB - Nov 11, 1960  Chief Complaint  Patient presents with   Blood Pressure Check       Subjective:   [name] is a 62 y.o. male here today for a Bp follow up visit. He also, inquired about donating plasma -request denied. Writer already aware of circumstance health at this time is more important. Patient has No headache, No chest pain, No abdominal pain - No Nausea, No new weakness tingling or numbness, No Cough - shortness of breath  No problems updated.  No Known Allergies  No past medical history on file.  Current Outpatient Medications on File Prior to Visit  Medication Sig Dispense Refill   apixaban (ELIQUIS) 5 MG TABS tablet take 1 tablet by mouth twice daily 60 tablet 1   baclofen (LIORESAL) 10 MG tablet Take 2.5 tablets (25 mg total) by mouth 3 (three) times daily. 45 tablet 0   gabapentin (NEURONTIN) 300 MG capsule Take 1 capsule (300 mg total) by mouth 3 (three) times daily. 90 capsule 0   No current facility-administered medications on file prior to visit.    Objective:   Vitals:   08/27/22 1102  BP: 125/76  Pulse: (Abnormal) 108  Resp: 16  SpO2: 99%  Weight: 128 lb 3.2 oz (58.2 kg)  Height: 5\' 10"  (1.778 m)    Comprehensive ROS Pertinent positive and negative noted in HPI   Exam General appearance : Awake, alert, not in any distress. Speech Clear. Not toxic looking HEENT: Atraumatic and Normocephalic, pupils equally reactive to light and accomodation Neck: Supple, no JVD. No cervical lymphadenopathy.  Chest: Good air entry bilaterally, no added sounds  CVS: S1 S2 regular, no murmurs.  Abdomen: Bowel sounds present, Non tender and not distended with no gaurding, rigidity or rebound. Extremities: unstable gait uses walker  Neurology: Awake alert, and oriented X 3, CSkin: No Rash  Data Review Lab Results  Component Value Date   HGBA1C 5.2  07/05/2021    Assessment & Plan  Diagnoses and all orders for this visit:  Encounter for completion of form with patient 2/2 Malnutrition of moderate degree  Denied for plasma donation- need for money  BP check Wnl     Patient have been counseled extensively about nutrition and exercise. Other issues discussed during this visit include: low cholesterol diet, weight control and daily exercise, foot care, annual eye examinations at Ophthalmology, importance of adherence with medications and regular follow-up. We also discussed long term complications of uncontrolled diabetes and hypertension.   Return in about 3 months (around 11/26/2022) for Bp/f/u labs.  The patient was given clear instructions to go to ER or return to medical center if symptoms don't improve, worsen or new problems develop. The patient verbalized understanding. The patient was told to call to get lab results if they haven't heard anything in the next week.   This note has been created with Education officer, environmental. Any transcriptional errors are unintentional.   Grayce Sessions, NP 08/31/2022, 2:06 PM

## 2022-09-09 ENCOUNTER — Other Ambulatory Visit (INDEPENDENT_AMBULATORY_CARE_PROVIDER_SITE_OTHER): Payer: Self-pay | Admitting: Primary Care

## 2022-09-09 NOTE — Telephone Encounter (Signed)
Medication Refill - Medication: apixaban (ELIQUIS) 5 MG TABS tablet   Has the patient contacted their pharmacy? No. (Agent: If no, request that the patient contact the pharmacy for the refill. If patient does not wish to contact the pharmacy document the reason why and proceed with request.) (Agent: If yes, when and what did the pharmacy advise?)  Preferred Pharmacy (with phone number or street name):  [facility] [phone] [place], [place] - [address] Phone: [phone]  Fax: [phone]     Has the patient been seen for an appointment in the last year OR does the patient have an upcoming appointment? Yes.    Agent: Please be advised that RX refills may take up to 3 business days. We ask that you follow-up with your pharmacy.

## 2022-09-11 MED ORDER — APIXABAN 5 MG PO TABS: ORAL_TABLET | ORAL | 1 refills | Status: DC

## 2022-09-11 NOTE — Telephone Encounter (Signed)
Requested Prescriptions  Pending Prescriptions Disp Refills   apixaban (ELIQUIS) 5 MG TABS tablet 60 tablet 1    Sig: take 1 tablet by mouth twice daily     Hematology:  Anticoagulants - apixaban Failed - 09/09/2022  3:50 PM      Failed - HGB in normal range and within 360 days    Hemoglobin  Date Value Ref Range Status  06/24/2022 12.8 (L) 13.0 - 17.7 g/dL Final         Passed - PLT in normal range and within 360 days    Platelets  Date Value Ref Range Status  06/24/2022 240 150 - 450 x10E3/uL Final         Passed - HCT in normal range and within 360 days    Hematocrit  Date Value Ref Range Status  06/24/2022 38.4 37.5 - 51.0 % Final         Passed - Cr in normal range and within 360 days    Creatinine, Ser  Date Value Ref Range Status  06/24/2022 0.95 0.76 - 1.27 mg/dL Final         Passed - AST in normal range and within 360 days    AST  Date Value Ref Range Status  06/24/2022 28 0 - 40 IU/L Final         Passed - ALT in normal range and within 360 days    ALT  Date Value Ref Range Status  06/24/2022 11 0 - 44 IU/L Final         Passed - Valid encounter within last 12 months    Recent Outpatient Visits           2 weeks ago Encounter for completion of form with patient   [place] [facility] Grayce Sessions, NP   2 months ago Encounter to establish care   [place] [facility] Grayce Sessions, NP       Future Appointments             In 3 weeks Randa Evens Kinnie Scales, NP [place] [facility]

## 2022-09-26 ENCOUNTER — Telehealth: Payer: Self-pay | Admitting: Primary Care

## 2022-09-26 NOTE — Telephone Encounter (Signed)
Copied from CRM [id number]. Topic: General - Inquiry >> Sep 26, 2022  2:04 PM Patsy Lager T wrote: Reason for CRM: Patient called wanted to wish Dr Randa Evens a Happy Birthday and he will see her on Wednesday

## 2022-10-01 ENCOUNTER — Telehealth: Payer: Self-pay | Admitting: Primary Care

## 2022-10-01 NOTE — Telephone Encounter (Signed)
Copied from CRM [id number]. Topic: General - Other >> Oct 01, 2022  2:57 PM Ja-Kwan M wrote: Reason for CRM: Pt stated that he needs to speak with Marcelino Duster regarding a medical reason. Pt declined to provide any further information. Cb#  [phone]

## 2022-10-02 ENCOUNTER — Ambulatory Visit (INDEPENDENT_AMBULATORY_CARE_PROVIDER_SITE_OTHER): Payer: Self-pay | Admitting: Primary Care

## 2022-10-02 NOTE — Telephone Encounter (Signed)
Returned pt call. Pt states he was calling about a follow up appt. Made pt aware that he has an appt scheduled for 5/21. Pt doesn't have any questions or concerns

## 2022-10-08 ENCOUNTER — Other Ambulatory Visit: Payer: Self-pay

## 2022-10-08 ENCOUNTER — Emergency Department (HOSPITAL_COMMUNITY): Payer: Medicaid Other

## 2022-10-08 ENCOUNTER — Encounter (HOSPITAL_COMMUNITY): Payer: Self-pay

## 2022-10-08 ENCOUNTER — Emergency Department (HOSPITAL_COMMUNITY)
Admission: EM | Admit: 2022-10-08 | Discharge: 2022-10-09 | Disposition: A | Discharge status: Home / Self Care | Payer: Medicaid Other | Attending: Emergency Medicine | Admitting: Emergency Medicine

## 2022-10-08 DIAGNOSIS — Z7901 Long term (current) use of anticoagulants: Secondary | ICD-10-CM | POA: Insufficient documentation

## 2022-10-08 DIAGNOSIS — W19XXXA Unspecified fall, initial encounter: Secondary | ICD-10-CM | POA: Diagnosis not present

## 2022-10-08 DIAGNOSIS — Y92039 Unspecified place in apartment as the place of occurrence of the external cause: Secondary | ICD-10-CM | POA: Diagnosis not present

## 2022-10-08 DIAGNOSIS — Z043 Encounter for examination and observation following other accident: Secondary | ICD-10-CM | POA: Diagnosis present

## 2022-10-08 LAB — ETHANOL: Alcohol, Ethyl (B): 33 mg/dL — ABNORMAL HIGH (ref <10)

## 2022-10-08 LAB — COMPREHENSIVE METABOLIC PANEL
ALT: 38 U/L (ref 0–44)
AST: 73 U/L — ABNORMAL HIGH (ref 15–41)
Albumin: 3.4 g/dL — ABNORMAL LOW (ref 3.5–5.0)
Alkaline Phosphatase: 87 U/L (ref 38–126)
Anion gap: 11 (ref 5–15)
BUN: 9 mg/dL (ref 8–23)
CO2: 22 mmol/L (ref 22–32)
Calcium: 9.3 mg/dL (ref 8.9–10.3)
Chloride: 101 mmol/L (ref 98–111)
Creatinine, Ser: 0.8 mg/dL (ref 0.61–1.24)
GFR, Estimated: >60 mL/min (ref >60)
Glucose, Bld: 94 mg/dL (ref 70–99)
Potassium: 3.7 mmol/L (ref 3.5–5.1)
Sodium: 134 mmol/L — ABNORMAL LOW (ref 135–145)
Total Bilirubin: 0.5 mg/dL (ref 0.3–1.2)
Total Protein: 7.7 g/dL (ref 6.5–8.1)

## 2022-10-08 LAB — CBC WITH DIFFERENTIAL/PLATELET
Abs Immature Granulocytes: 0.01 10*3/uL (ref 0.00–0.07)
Basophils Absolute: 0 10*3/uL (ref 0.0–0.1)
Basophils Relative: 0 %
Eosinophils Absolute: 0.3 10*3/uL (ref 0.0–0.5)
Eosinophils Relative: 5 %
HCT: 37 % — ABNORMAL LOW (ref 39.0–52.0)
Hemoglobin: 12.7 g/dL — ABNORMAL LOW (ref 13.0–17.0)
Immature Granulocytes: 0 %
Lymphocytes Relative: 38 %
Lymphs Abs: 2.1 10*3/uL (ref 0.7–4.0)
MCH: 33.5 pg (ref 26.0–34.0)
MCHC: 34.3 g/dL (ref 30.0–36.0)
MCV: 97.6 fL (ref 80.0–100.0)
Monocytes Absolute: 0.6 10*3/uL (ref 0.1–1.0)
Monocytes Relative: 11 %
Neutro Abs: 2.6 10*3/uL (ref 1.7–7.7)
Neutrophils Relative %: 46 %
Platelets: 177 10*3/uL (ref 150–400)
RBC: 3.79 MIL/uL — ABNORMAL LOW (ref 4.22–5.81)
RDW: 14.8 % (ref 11.5–15.5)
WBC: 5.7 10*3/uL (ref 4.0–10.5)
nRBC: 0 % (ref 0.0–0.2)

## 2022-10-08 MED ORDER — LORAZEPAM 1 MG PO TABS: 1.0000 mg | ORAL_TABLET | Freq: Four times a day (QID) | ORAL | Status: DC | PRN

## 2022-10-08 MED ORDER — APIXABAN 5 MG PO TABS
5.0000 mg | ORAL_TABLET | Freq: Two times a day (BID) | ORAL | Status: DC
  Administered 2022-10-08: 5 mg via ORAL
  Filled 2022-10-08 (×2): qty 1

## 2022-10-08 MED ORDER — HYDROXYZINE HCL 25 MG PO TABS: 25.0000 mg | ORAL_TABLET | Freq: Four times a day (QID) | ORAL | Status: DC | PRN

## 2022-10-08 MED ORDER — GABAPENTIN 300 MG PO CAPS
300.0000 mg | ORAL_CAPSULE | Freq: Three times a day (TID) | ORAL | Status: DC
  Administered 2022-10-08 – 2022-10-09 (×3): 300 mg via ORAL
  Filled 2022-10-08 (×3): qty 1

## 2022-10-08 MED ORDER — THIAMINE MONONITRATE 100 MG PO TABS
100.0000 mg | ORAL_TABLET | Freq: Every day | ORAL | Status: DC
Start: 2022-10-09 — End: 2022-10-09
  Administered 2022-10-09: 100 mg via ORAL
  Filled 2022-10-08: qty 1

## 2022-10-08 MED ORDER — BACLOFEN 10 MG PO TABS
25.0000 mg | ORAL_TABLET | Freq: Three times a day (TID) | ORAL | Status: DC
  Administered 2022-10-08 – 2022-10-09 (×3): 25 mg via ORAL
  Filled 2022-10-08 (×4): qty 3

## 2022-10-08 MED ORDER — LACTATED RINGERS IV BOLUS
1000.0000 mL | Freq: Once | INTRAVENOUS | Status: AC
  Administered 2022-10-08: 1000 mL via INTRAVENOUS

## 2022-10-08 MED ORDER — CELECOXIB 200 MG PO CAPS: 200.0000 mg | ORAL_CAPSULE | Freq: Two times a day (BID) | ORAL | Status: DC | PRN

## 2022-10-08 MED ORDER — ONDANSETRON 4 MG PO TBDP: 4.0000 mg | ORAL_TABLET | Freq: Four times a day (QID) | ORAL | Status: DC | PRN

## 2022-10-08 MED ORDER — LACTATED RINGERS IV SOLN: INTRAVENOUS | Status: DC

## 2022-10-08 MED ORDER — APIXABAN 5 MG PO TABS
5.0000 mg | ORAL_TABLET | Freq: Two times a day (BID) | ORAL | Status: DC
  Administered 2022-10-09: 5 mg via ORAL
  Filled 2022-10-08: qty 1

## 2022-10-08 MED ORDER — LOPERAMIDE HCL 2 MG PO CAPS: 2.0000 mg | ORAL_CAPSULE | ORAL | Status: DC | PRN

## 2022-10-08 MED ORDER — ADULT MULTIVITAMIN W/MINERALS CH
1.0000 | ORAL_TABLET | Freq: Every day | ORAL | Status: DC
Start: 2022-10-08 — End: 2022-10-09
  Administered 2022-10-08 – 2022-10-09 (×2): 1 via ORAL
  Filled 2022-10-08 (×2): qty 1

## 2022-10-08 NOTE — ED Provider Notes (Signed)
[facility] Provider Note   CSN: [id number] Arrival date & time: 10/08/22  1111     History  Chief Complaint  Patient presents with   Fall    On thinners    Brian [name] is a 62 y.o. male.  62 year old male presents after a witnessed fall at his apartment.  According to EMS, patient was found on the ground.  Patient does use a walker at baseline.  Has a history of a recent stroke.  He is on Eliquis but denies hitting his head at this time.  States he has been passing out a few times over the last month.  No associated chest pain or shortness of breath with it.  EMS stated that while they were there and police arrived to serve patient with eviction papers.  Patient transported here for further management       Home Medications Prior to Admission medications   Medication Sig Start Date End Date Taking? Authorizing Provider  apixaban (ELIQUIS) 5 MG TABS tablet take 1 tablet by mouth twice daily 09/11/22   Grayce Sessions, NP  baclofen (LIORESAL) 10 MG tablet Take 2.5 tablets (25 mg total) by mouth 3 (three) times daily. 11/02/21   Val Eagle D, NP  gabapentin (NEURONTIN) 300 MG capsule Take 1 capsule (300 mg total) by mouth 3 (three) times daily. 11/02/21   Val Eagle D, NP      Allergies    Patient has no known allergies.    Review of Systems   Review of Systems  All other systems reviewed and are negative.   Physical Exam Updated Vital Signs BP 102/70   Pulse 99   Resp 17   SpO2 98%  Physical Exam Vitals and nursing note reviewed.  Constitutional:      General: He is not in acute distress.    Appearance: Normal appearance. He is well-developed. He is not toxic-appearing.  HENT:     Head: Normocephalic and atraumatic.  Eyes:     General: Lids are normal.     Conjunctiva/sclera: Conjunctivae normal.     Pupils: Pupils are equal, round, and reactive to light.  Neck:     Thyroid: No thyroid mass.      Trachea: No tracheal deviation.  Cardiovascular:     Rate and Rhythm: Normal rate and regular rhythm.     Heart sounds: Normal heart sounds. No murmur heard.    No gallop.  Pulmonary:     Effort: Pulmonary effort is normal. No respiratory distress.     Breath sounds: Normal breath sounds. No stridor. No decreased breath sounds, wheezing, rhonchi or rales.  Abdominal:     General: There is no distension.     Palpations: Abdomen is soft.     Tenderness: There is no abdominal tenderness. There is no rebound.  Musculoskeletal:        General: No tenderness. Normal range of motion.     Cervical back: Normal range of motion and neck supple.  Skin:    General: Skin is warm and dry.     Findings: No abrasion or rash.  Neurological:     Mental Status: He is alert and oriented to person, place, and time. Mental status is at baseline.     GCS: GCS eye subscore is 4. GCS verbal subscore is 5. GCS motor subscore is 6.     Cranial Nerves: No cranial nerve deficit.     Sensory: No sensory deficit.  Motor: Motor function is intact.  Psychiatric:        Attention and Perception: Attention normal.        Speech: Speech normal.        Behavior: Behavior normal.     ED Results / Procedures / Treatments   Labs (all labs ordered are listed, but only abnormal results are displayed) Labs Reviewed  CBC WITH DIFFERENTIAL/PLATELET  COMPREHENSIVE METABOLIC PANEL  RAPID URINE DRUG SCREEN, HOSP PERFORMED  ETHANOL  URINALYSIS, ROUTINE W REFLEX MICROSCOPIC    EKG EKG Interpretation  Date/Time:  Tuesday Oct 08 2022 11:20:40 EDT Ventricular Rate:  110 PR Interval:  132 QRS Duration: 94 QT Interval:  335 QTC Calculation: 452 R Axis:   14 Text Interpretation: Sinus tachycardia Probable anteroseptal infarct, old Confirmed by Lorre Nick ([id number]) on 10/08/2022 11:39:43 AM  Radiology DG Pelvis 1-2 Views  Result Date: 10/08/2022 CLINICAL DATA:  fall EXAM: PELVIS - 1-2 VIEW COMPARISON:  CT  07/14/2021. FINDINGS: There is no evidence of acute fracture. Unchanged small chronic bony fragment adjacent to the right acetabulum as seen on prior CT. Alignment is normal. 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 Degenerative changes of the lower lumbar spine, partially visualized. IMPRESSION: No evidence of acute fracture on single frontal view of the pelvis. 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 Electronically Signed   By: Caprice Renshaw M.D.   On: 10/08/2022 13:18   DG Lumbar Spine Complete  Result Date: 10/08/2022 CLINICAL DATA:  fall EXAM: LUMBAR SPINE - COMPLETE 4+ VIEW COMPARISON:  MRI lumbar spine 08/27/2021 FINDINGS: There are 5 non-rib-bearing lumbar vertebrae. There is no evidence of lumbar spine fracture. Trace degenerative retrolisthesis at L3-L4 and anterolisthesis at L4-L5. There is multilevel degenerative disc disease, moderate-severe at L3-L4 and L5-S1. There is mild to moderate lower lumbar predominant facet arthropathy. Straightening of the lumbar lordosis. IMPRESSION: No evidence of lumbar spine fracture. Unchanged multilevel degenerative disc disease, moderate-severe at L3-L4 and L5-S1. Trace degenerative retrolisthesis at L3-L4 and anterolisthesis at L4-L5. Mild to moderate lower lumbar predominant facet arthropathy. Electronically Signed   By: Caprice Renshaw M.D.   On: 10/08/2022 13:10   CT Head Wo Contrast  Result Date: 10/08/2022 CLINICAL DATA:  62 year old male status post fall. EXAM: CT HEAD WITHOUT CONTRAST TECHNIQUE: Contiguous axial images were obtained from the base of the skull through the vertex without intravenous contrast. RADIATION DOSE REDUCTION: This exam was performed according to the departmental dose-optimization program which includes automated exposure control, adjustment of the mA and/or kV according to patient size and/or use of iterative reconstruction technique. COMPARISON:  Head CT 09/21/2021.  Brain MRI 07/07/2021. FINDINGS: Brain: Stable cerebral  volume. No midline shift, ventriculomegaly, mass effect, evidence of mass lesion, intracranial hemorrhage or evidence of cortically based acute infarction. Gray-white differentiation appears stable and within normal limits for age. Vascular: No suspicious intracranial vascular hyperdensity. Mild Calcified atherosclerosis at the skull base. Skull: Stable.  No acute osseous abnormality identified. Sinuses/Orbits: Scattered mild paranasal sinus mucosal thickening is increased from last year. Tympanic cavities and mastoids remain clear. No paranasal sinus fluid level. Other: No orbit or scalp soft tissue injury identified. IMPRESSION: 1. No acute traumatic injury identified. Stable and negative for age noncontrast CT appearance of the brain. 2. Mild new paranasal sinus inflammation since last year. Electronically Signed   By: Odessa Fleming M.D.   On: 10/08/2022 12:24    Procedures Procedures    Medications Ordered in ED Medications  lactated ringers infusion (has no administration in time range)  lactated ringers bolus 1,000 mL (1,000 mLs Intravenous New Bag/Given 10/08/22 1230)    ED Course/ Medical Decision Making/ A&P                             Medical Decision Making Amount and/or Complexity of Data Reviewed Labs: ordered. Radiology: ordered.  Risk Prescription drug management.  Patient is EKG per my interpretation shows sinus tach. Patient had mechanical fall and he is on thinners.  Patient's pelvis x-ray, head CT, LS spine all without acute findings.  Labs are pending at this time.  Patient states he has nowhere to go at this time.  Will likely require a TOC consult.  Dr. Mauro Kaufmann to follow-up on labs        Final Clinical Impression(s) / ED Diagnoses Final diagnoses:  None    Rx / DC Orders ED Discharge Orders     None         Lorre Nick, MD 10/08/22 1524

## 2022-10-08 NOTE — ED Triage Notes (Signed)
PT BIB EMS, states he fell leaving his apartment, uses a walker at baseline, history of stroke, has had recent falls, PD was also there serving eviction papers, patient denies hitting his head, no visible injuries.    CBG 97 HR 108 Resp 24 97% RA 108/70's

## 2022-10-08 NOTE — Discharge Instructions (Signed)
ASSISTED LIVING, REST HOME & FAMILY CARE HOME FACILITIES    NAME OF FACILITY ADDRESS TELEPHONE NUMBER TYPE OF HOME BED OFFERED?  [place] [facility] [address] [place], [place] [address] [phone] ALF/RH   [facility] [address] [place], [place] [address] [phone] [facility]   [facility] [address] [place], [place] [address] [phone] [facility]   [facility] [address] [place], [place] [address] [phone] [facility] [address] [place], [place] [address] [phone] ALF/RH   [facility] [address] [place], [place] [address] [phone] ALF/RH   [facility] [address] [place], [place] [address] [phone] ALF/RH   [facility] [address] [place], [place] [address] [phone] ALF/RH   [facility] [address] [place], [place] [address] [phone] ALF/RH   [facility] [address] [place], [place] [address] [phone] ALF/RH   [facility] [address] [place], [place] [address] [phone] ALF/RH   [facility] [address] [place], [place] [address] [phone] ALF/RH    [facility] [address] [place], [place] [address] [phone] [facility]   [facility] [address] [place], [place] [address] [phone] ALF/RH   [facility] [address] [place], [place] [address] [phone] ALF/RH   [facility] #1   [address] [place], [place] [address] [phone] [facility]   [facility] [address] [place], [place] [address] [phone] [facility] [address] [place], [place] [address] [phone] ALF   [facility] [address] [place], [place] [address] [phone] ALF/RH   [facility] [address] [place], [place] [address] [phone] [facility]   [facility] [address] [place], [place] [address] [phone] ALF/RH   [facility] [address] [place], [place] [address] [phone] [facility]   [facility] [address] [place], [place] [address] [phone] [facility]   [facility] [address] [place], [place] [address] [phone] [facility]   [facility] [address] [place], [place] [phone] ALF/RH   [facility] [address] [place], [place] [address] [phone] ALF/RH   [facility] [address] [place], [place] [address] [phone] [facility]    [facility] [address] [place], [place] [address] [phone] [facility]   [facility] [address] [place], [place] [address] [phone] ALF/RH   [facility] [address] [place], [place] [address] [phone] [facility]  [address] [place], [place] [address] [phone] ALF/RH   [address] [place], [place] [address] [phone] ALF/RH   [facility] [address] [place], [place] [address] [phone] ALF/RH   [facility] [address] [place] [address] [place], [place] [address] [phone] ALF/RH   [facility] [address] [place], [place] [address] [phone] ALF/RH   [facility] [address] [place], [place] [address] [phone] [facility]   [facility] [address] [place], [place] [address] [phone] [facility]   [place] [phone]  [address] [place], [place] [address] [phone] ALF/RH   [facility]  [address] [place], [place] [address] [phone] ALF/RH   [facility] [address] [place], [place] [address] [phone] [facility]

## 2022-10-08 NOTE — Progress Notes (Signed)
TOC CSW attached resources to pts AVS for possible future use.  Cayleb Jarnigan Tarpley-Carter, MSW, LCSW-A Pronouns:  She/Her/Hers [facility] of Care Clinical Social Worker Direct Number:  [phone] [email or web address]

## 2022-10-08 NOTE — Evaluation (Signed)
Physical Therapy Evaluation Patient Details Name: Brian [name] MRN: [id number] DOB: 07-05-1960 Today's Date: 10/08/2022  History of Present Illness  62 y.o. male presents to [facility] hospital on 10/08/2022 after a fall at his apartment. PT also reports multiple recent syncopal episodes. PMH includes C3-7 ACDF, ETOH abuse, CVA.  Clinical Impression  Pt presents to PT with deficits in strength, power, endurance, gait, functional mobility, balance. Pt reports many falls since last discharge from hospital, with continued issues with tremors and poor neuromuscular endurance. Pt is able to transfer and ambulate for short distances with great difficulty this session. Pt is at a high risk for falls due to weakness and poor endurance. Pt is unable to care for himself and is unable to identify any caregiver support at this time. PT recommends short term inpatient PT services at the time of discharge.       Recommendations for follow up therapy are one component of a multi-disciplinary discharge planning process, led by the attending physician.  Recommendations may be updated based on patient status, additional functional criteria and insurance authorization.  Follow Up Recommendations Can patient physically be transported by private vehicle: Yes     Assistance Recommended at Discharge Intermittent Supervision/Assistance  Patient can return home with the following  A little help with walking and/or transfers;A lot of help with bathing/dressing/bathroom;Assistance with cooking/housework;Help with stairs or ramp for entrance;Assist for transportation    Equipment Recommendations Wheelchair (measurements PT);Wheelchair cushion (measurements PT)  Recommendations for Other Services       Functional Status Assessment Patient has had a recent decline in their functional status and demonstrates the ability to make significant improvements in function in a reasonable and predictable amount of time.      Precautions / Restrictions Precautions Precautions: Fall Precaution Comments: chronic cervical myelopathy Restrictions Weight Bearing Restrictions: No      Mobility  Bed Mobility Overal bed mobility: Needs Assistance Bed Mobility: Supine to Sit, Sit to Supine     Supine to sit: Supervision Sit to supine: Min guard        Transfers Overall transfer level: Needs assistance Equipment used: Rolling walker (2 wheels) Transfers: Sit to/from Stand Sit to Stand: Min guard, From elevated surface           General transfer comment: minG from very tall stretcher    Ambulation/Gait Ambulation/Gait assistance: Min assist Gait Distance (Feet): 25 Feet Assistive device: Rolling walker (2 wheels) Gait Pattern/deviations: Step-to pattern Gait velocity: reduced Gait velocity interpretation: <1.31 ft/sec, indicative of household ambulator   General Gait Details: short step-to gait, increased trunk flexion, frequent tremors of BUE and BLE in standing. Pt requires 2-3 standing rest breaks due to muscular fatigue  Stairs            Wheelchair Mobility    Modified Rankin (Stroke Patients Only)       Balance Overall balance assessment: Needs assistance Sitting-balance support: No upper extremity supported, Feet supported Sitting balance-Leahy Scale: Fair     Standing balance support: Bilateral upper extremity supported, Reliant on assistive device for balance Standing balance-Leahy Scale: Poor                               Pertinent Vitals/Pain Pain Assessment Pain Assessment: 0-10 Pain Score: 9  Pain Location: back Pain Descriptors / Indicators: Aching Pain Intervention(s): Monitored during session    Home Living Family/patient expects to be discharged to:: Unsure  Additional Comments: pt evicted just prior to admission    Prior Function Prior Level of Function : History of Falls (last six months);Independent/Modified  Independent             Mobility Comments: ambulatory with RW however falling frequently       Hand Dominance        Extremity/Trunk Assessment   Upper Extremity Assessment Upper Extremity Assessment: RUE deficits/detail;LUE deficits/detail RUE Deficits / Details: grossly 4-/5, tremors with weightbearing on walker LUE Deficits / Details: grossly 4-/5, tremors with weightbearing on walker    Lower Extremity Assessment Lower Extremity Assessment: RLE deficits/detail;LLE deficits/detail RLE Deficits / Details: grossly 4-/5, BLE tremors in weightbearing exacerbated by fatigue LLE Deficits / Details: grossly 4-/5, BLE tremors in weightbearing exacerbated by fatigue    Cervical / Trunk Assessment Cervical / Trunk Assessment: Normal (history of ACDF)  Communication   Communication: No difficulties  Cognition Arousal/Alertness: Awake/alert Behavior During Therapy: WFL for tasks assessed/performed Overall Cognitive Status: Within Functional Limits for tasks assessed                                          General Comments General comments (skin integrity, edema, etc.): VSS on RA    Exercises     Assessment/Plan    PT Assessment Patient needs continued PT services  PT Problem List Decreased strength;Decreased activity tolerance;Decreased balance;Decreased mobility;Pain       PT Treatment Interventions DME instruction;Gait training;Stair training;Functional mobility training;Therapeutic activities;Therapeutic exercise;Balance training;Neuromuscular re-education;Patient/family education;Wheelchair mobility training    PT Goals (Current goals can be found in the Care Plan section)  Acute Rehab PT Goals Patient Stated Goal: to go to rehab to improve strength and reduce falls risk PT Goal Formulation: With patient Time For Goal Achievement: 10/22/22 Potential to Achieve Goals: Fair    Frequency Min 3X/week     Co-evaluation                AM-PAC PT "6 Clicks" Mobility  Outcome Measure Help needed turning from your back to your side while in a flat bed without using bedrails?: A Little Help needed moving from lying on your back to sitting on the side of a flat bed without using bedrails?: A Little Help needed moving to and from a bed to a chair (including a wheelchair)?: A Little Help needed standing up from a chair using your arms (e.g., wheelchair or bedside chair)?: A Little Help needed to walk in hospital room?: A Little Help needed climbing 3-5 steps with a railing? : Total 6 Click Score: 16    End of Session Equipment Utilized During Treatment: Gait belt Activity Tolerance: Patient limited by fatigue Patient left: in bed;with call bell/phone within reach Nurse Communication: Mobility status PT Visit Diagnosis: Other abnormalities of gait and mobility (R26.89);Muscle weakness (generalized) (M62.81);Other symptoms and signs involving the nervous system (R29.898);History of falling (Z91.81)    Time: 1610-9604 PT Time Calculation (min) (ACUTE ONLY): 17 min   Charges:   PT Evaluation $PT Eval Low Complexity: 1 Low          Arlyss Gandy, PT, DPT Acute Rehabilitation Office [phone]   Arlyss Gandy 10/08/2022, 5:39 PM

## 2022-10-08 NOTE — ED Provider Notes (Signed)
Pt signed out by Dr. Freida Busman pending labs.  Labs without anything acute.  Pt does not have anywhere to go as he has been evicted.  He is unsteady on his feet and said he's fallen multiple times.  TOC and PT consulted.   Jacalyn Lefevre, MD 10/08/22 [phone]

## 2022-10-08 NOTE — Care Management (Signed)
Post PT evaluation recommendation for assistance with a w/c.  Order will be placed and DME company contacted to deliver w/c to the ED. Will updated ED RN and EDP.   ED TOC SW will continue to follow for discharge needs.

## 2022-10-08 NOTE — Progress Notes (Signed)
TOC CSW attempted to contact pt son, Marlowe Lippold [phone].  CSW will continue to attempt to contact him.  Rubina Basinski Tarpley-Carter, MSW, LCSW-A Pronouns:  She/Her/Hers [facility] Clinical Social Worker Direct Number:  [phone] [email or web address]

## 2022-10-09 ENCOUNTER — Telehealth: Payer: Self-pay | Admitting: Surgery

## 2022-10-09 NOTE — TOC Initial Note (Signed)
Transition of Care [facility]) - Initial/Assessment Note    Patient Details  Name: Brian [name] MRN: [id number] Date of Birth: 12-15-1960  [facility] CM/SW Contact:    Susa Simmonds, LCSWA Phone Number: 10/09/2022, 11:57 AM  Clinical Narrative: CSW contacted [facility] to find out if patient has 7 days left in his apartment before eviction. CSW spoke with the property manager Senaida Lange who told CSW that patient doesn't have 7 days left and he was evicted yesterday. Ms. Burna Mortimer stated the apartment is padlocked and patient has 10 days to get his stuff out. Ms. Burna Mortimer stated patient has no family and his son doesn't help him. Ms. Burna Mortimer stated patient hasn't received his disability which is needed for long term care. Ms. Burna Mortimer stated she has been communicating with the disability attorney who has been fighting for patient to get disability. Ms. Burna Mortimer stated that patient has a medical appointment on June 5th. Ms. Burna Mortimer stated she is willing to be patients POA and assist him. Ms. Burna Mortimer stated that she will come up to the hospital and complete the paperwork with patient. Ms. Burna Mortimer stated if her church can't but patient in a hotel for a week then she will. Ms. Burna Mortimer also spoke about patient possibly moving into her home. Ms. Burna Mortimer stated she doesn't want patient on the streets or in a homeless shelter.     CSW spoke with patient who stated that he is fine with signing an advanced directive and having Ms. Burna Mortimer as his HPOA.                  Expected Discharge Plan: Home/Self Care     Patient Goals and CMS Choice Patient states their goals for this hospitalization and ongoing recovery are:: Patients property manager is assisting patient with a place to live          Expected Discharge Plan and Services       Living arrangements for the past 2 months: Apartment                                      Prior Living Arrangements/Services Living arrangements for  the past 2 months: Apartment Lives with:: Self              Current home services: DME    Activities of Daily Living      Permission Sought/Granted                  Emotional Assessment Appearance:: Appears stated age Attitude/Demeanor/Rapport: Engaged Affect (typically observed): Calm Orientation: : Oriented to Self, Oriented to Place, Oriented to  Time, Oriented to Situation Alcohol / Substance Use: Not Applicable Psych Involvement: No (comment)  Admission diagnosis:  Fall on Eliquis Patient Active Problem List   Diagnosis Date Noted   Aspiration pneumonia (HCC)    Normocytic anemia 09/04/2021   Fall during current hospitalization 08/30/2021   Lumbar back pain 08/27/2021   Urinary retention 07/26/2021   Abdominal distention    Acute respiratory failure with hypoxia (HCC) 07/15/2021   Malnutrition of moderate degree 07/15/2021   Acute pulmonary embolism (HCC) 07/14/2021   Sinus tachycardia 07/13/2021   Tobacco use disorder 07/11/2021   Physical deconditioning 07/11/2021   Protein-calorie malnutrition, severe (HCC) 07/05/2021   Cervical myelopathy (HCC); severe cervical spinal stenosis with cord compression s/p ACDF C3-7 on 2/3, complicated by epidural hematoma s/p  evacuation 2/7 06/29/2021   PCP:  Grayce Sessions, NP Pharmacy:   [facility] [address], [place] - [address] [place] [address] Phone: [phone] Fax: [phone]  [facility] [address] [place] [address] Phone: [phone] Fax: [phone]     Social Determinants of Health (SDOH) Social History: SDOH Screenings   Depression (PHQ2-9): High Risk (06/24/2022)  Tobacco Use: High Risk (10/08/2022)   SDOH Interventions:     Readmission Risk Interventions     No data to display

## 2022-10-09 NOTE — Progress Notes (Signed)
Patient discharged with property manager Senaida Lange who is now patients healthcare power of attorney. Patient stated he didn't want to go to a nursing home or assisted living. Patient stated his plan when he gets his disability is to get rehab only and not from a nursing home. CSW did try to explain that the nursing homes provide rehab. Patient also stated he didn't want long term care at a nursing home either. Patient previously had home health. Patient stated he didn't like home health because all they did was sit and watch TV when they were there. Patient stated that he should qualify and get his disability soon.

## 2022-10-09 NOTE — Progress Notes (Signed)
Visited with patient to provide emotional and spiritual support and to assist with completing advance Directive. AD done and notarized.  Per staff/nurse pt being discharged today. Chaplain available as needed.  Venida Jarvis, [place], [facility], [place] [facility] [phone]

## 2022-10-09 NOTE — Telephone Encounter (Signed)
W/c was sent to [address] [place], where patient is temporarily residing. Wheelchair was sent by [employer] couriers .

## 2022-10-09 NOTE — Discharge Planning (Signed)
RNCM contacted [employer] to have wheelchair delivered to hospital rather than address listed as pt no longer lives there.

## 2022-10-15 ENCOUNTER — Ambulatory Visit (INDEPENDENT_AMBULATORY_CARE_PROVIDER_SITE_OTHER): Payer: Medicaid Other | Admitting: Primary Care

## 2022-12-05 ENCOUNTER — Telehealth (INDEPENDENT_AMBULATORY_CARE_PROVIDER_SITE_OTHER): Payer: Self-pay

## 2022-12-05 NOTE — Telephone Encounter (Signed)
Copied from CRM [id number]. Topic: General - Other >> Dec 05, 2022  9:43 AM Epimenio Foot F wrote: Reason for CRM: Pt is requesting that Marcelino Duster give him a callback.

## 2022-12-05 NOTE — Telephone Encounter (Signed)
Pt is requesting a call from provider .

## 2022-12-09 NOTE — Telephone Encounter (Signed)
Called Brian [name] just wanted to make sure I receive my happy birthday wish

## 2022-12-12 ENCOUNTER — Encounter (INDEPENDENT_AMBULATORY_CARE_PROVIDER_SITE_OTHER): Payer: Self-pay | Admitting: Primary Care

## 2022-12-12 ENCOUNTER — Ambulatory Visit (INDEPENDENT_AMBULATORY_CARE_PROVIDER_SITE_OTHER): Payer: Medicaid Other | Admitting: Primary Care

## 2022-12-12 ENCOUNTER — Telehealth (INDEPENDENT_AMBULATORY_CARE_PROVIDER_SITE_OTHER): Payer: Self-pay | Admitting: Primary Care

## 2022-12-12 VITALS — BP 131/82 | Wt 132.0 lb

## 2022-12-12 DIAGNOSIS — Z1211 Encounter for screening for malignant neoplasm of colon: Secondary | ICD-10-CM

## 2022-12-12 DIAGNOSIS — Z760 Encounter for issue of repeat prescription: Secondary | ICD-10-CM | POA: Diagnosis not present

## 2022-12-12 MED ORDER — APIXABAN 5 MG PO TABS: ORAL_TABLET | ORAL | 1 refills | Status: DC

## 2022-12-12 NOTE — Progress Notes (Signed)
  [facility]  Brian [name], is a 62 y.o. male  FAO:[id number]  ONG:[id number]  DOB - 1961/01/27  No chief complaint on file.      Subjective:   [name] is a 62 y.o. male here today requesting refill on his Eliquis . Patient has No headache, No chest pain, No abdominal pain - No Nausea, No new weakness tingling or numbness, No Cough - shortness of breath Walker . No problems updated.  No Known Allergies  History reviewed. No pertinent past medical history.  Current Outpatient Medications on File Prior to Visit  Medication Sig Dispense Refill   baclofen (LIORESAL) 10 MG tablet Take 2.5 tablets (25 mg total) by mouth 3 (three) times daily. 45 tablet 0   gabapentin (NEURONTIN) 300 MG capsule Take 1 capsule (300 mg total) by mouth 3 (three) times daily. 90 capsule 0   No current facility-administered medications on file prior to visit.    Objective:   Vitals:   12/12/22 1222  BP: 131/82  Weight: 132 lb (59.9 kg)    Comprehensive ROS Pertinent positive and negative noted in HPI   Exam General appearance : Awake, alert, not in any distress. Speech Clear. Not toxic looking HEENT: Atraumatic and Normocephalic, pupils equally reactive to light and accomodation Neck: Supple, no JVD. No cervical lymphadenopathy.  Chest: Good air entry bilaterally, no added sounds  CVS: S1 S2 regular, no murmurs.  Abdomen: Bowel sounds present, Non tender and not distended with no gaurding, rigidity or rebound. Extremities: B/L Lower Ext shows no edema, both legs are warm to touch Neurology: Awake alert, and oriented X 3, CN II-XII intact, Non focal Skin: No Rash  Data Review Lab Results  Component Value Date   HGBA1C 5.2 07/05/2021    Assessment & Plan  Diagnoses and all orders for this visit:  Colon cancer screening -     Ambulatory referral to Gastroenterology  Medication refill -     apixaban (ELIQUIS) 5 MG TABS tablet; take 1 tablet by mouth twice  daily      Patient have been counseled extensively about nutrition and exercise. Other issues discussed during this visit include: low cholesterol diet, weight control and daily exercise, foot care, annual eye examinations at Ophthalmology, importance of adherence with medications and regular follow-up. We also discussed long term complications of uncontrolled diabetes and hypertension.   Return in about 3 months (around 03/14/2023).  The patient was given clear instructions to go to ER or return to medical center if symptoms don't improve, worsen or new problems develop. The patient verbalized understanding. The patient was told to call to get lab results if they haven't heard anything in the next week.   This note has been created with Education officer, environmental. Any transcriptional errors are unintentional.   Grayce Sessions, NP 12/12/2022, 12:40 PM

## 2022-12-14 ENCOUNTER — Other Ambulatory Visit: Payer: Self-pay

## 2022-12-14 DIAGNOSIS — Y908 Blood alcohol level of 240 mg/100 ml or more: Secondary | ICD-10-CM | POA: Insufficient documentation

## 2022-12-14 DIAGNOSIS — R410 Disorientation, unspecified: Secondary | ICD-10-CM | POA: Diagnosis present

## 2022-12-14 DIAGNOSIS — F10121 Alcohol abuse with intoxication delirium: Secondary | ICD-10-CM | POA: Insufficient documentation

## 2022-12-14 LAB — CBC WITH DIFFERENTIAL/PLATELET
Abs Immature Granulocytes: 0.01 10*3/uL (ref 0.00–0.07)
Basophils Absolute: 0 10*3/uL (ref 0.0–0.1)
Basophils Relative: 1 %
Eosinophils Absolute: 0.3 10*3/uL (ref 0.0–0.5)
Eosinophils Relative: 5 %
HCT: 41.9 % (ref 39.0–52.0)
Hemoglobin: 14.7 g/dL (ref 13.0–17.0)
Immature Granulocytes: 0 %
Lymphocytes Relative: 65 %
Lymphs Abs: 4.1 10*3/uL — ABNORMAL HIGH (ref 0.7–4.0)
MCH: 33.1 pg (ref 26.0–34.0)
MCHC: 35.1 g/dL (ref 30.0–36.0)
MCV: 94.4 fL (ref 80.0–100.0)
Monocytes Absolute: 0.7 10*3/uL (ref 0.1–1.0)
Monocytes Relative: 11 %
Neutro Abs: 1.1 10*3/uL — ABNORMAL LOW (ref 1.7–7.7)
Neutrophils Relative %: 18 %
Platelets: 171 10*3/uL (ref 150–400)
RBC: 4.44 MIL/uL (ref 4.22–5.81)
RDW: 16.5 % — ABNORMAL HIGH (ref 11.5–15.5)
WBC: 6.4 10*3/uL (ref 4.0–10.5)
nRBC: 0 % (ref 0.0–0.2)

## 2022-12-14 LAB — COMPREHENSIVE METABOLIC PANEL
ALT: 22 U/L (ref 0–44)
AST: 48 U/L — ABNORMAL HIGH (ref 15–41)
Albumin: 4.4 g/dL (ref 3.5–5.0)
Alkaline Phosphatase: 85 U/L (ref 38–126)
Anion gap: 12 (ref 5–15)
BUN: 9 mg/dL (ref 8–23)
CO2: 20 mmol/L — ABNORMAL LOW (ref 22–32)
Calcium: 8.9 mg/dL (ref 8.9–10.3)
Chloride: 104 mmol/L (ref 98–111)
Creatinine, Ser: 0.99 mg/dL (ref 0.61–1.24)
GFR, Estimated: >60 mL/min (ref >60)
Glucose, Bld: 105 mg/dL — ABNORMAL HIGH (ref 70–99)
Potassium: 4 mmol/L (ref 3.5–5.1)
Sodium: 136 mmol/L (ref 135–145)
Total Bilirubin: 0.6 mg/dL (ref 0.3–1.2)
Total Protein: 9.2 g/dL — ABNORMAL HIGH (ref 6.5–8.1)

## 2022-12-14 LAB — ETHANOL: Alcohol, Ethyl (B): 374 mg/dL (ref <10)

## 2022-12-14 NOTE — ED Triage Notes (Signed)
Pt to ed from home via [facility] for muscle spasms. Family informed EMS pt has been altered all day today. Pt has fallen more in the last few days. Pt is now having full body muscle spasms but takes meds for same.  Pt has had ETOH today  110 HR  99% RA 108 BGL   Pt is caox4, in no acute distress in triage. Pt is having what looks like pseudo seizures or full body muscle spasms but only last for a few seconds and he is coherent during them.

## 2022-12-14 NOTE — ED Notes (Signed)
Pt removed IV from self. Arm cleansed and bandage applied.

## 2022-12-15 ENCOUNTER — Emergency Department
Admission: EM | Admit: 2022-12-15 | Discharge: 2022-12-15 | Disposition: A | Discharge status: Home / Self Care | Payer: 59 | Attending: Emergency Medicine | Admitting: Emergency Medicine

## 2022-12-15 DIAGNOSIS — F10121 Alcohol abuse with intoxication delirium: Secondary | ICD-10-CM | POA: Diagnosis not present

## 2022-12-15 DIAGNOSIS — F10921 Alcohol use, unspecified with intoxication delirium: Secondary | ICD-10-CM

## 2022-12-15 MED ORDER — HALOPERIDOL LACTATE 5 MG/ML IJ SOLN
2.0000 mg | Freq: Once | INTRAMUSCULAR | Status: AC
  Administered 2022-12-15: 2 mg via INTRAVENOUS
  Filled 2022-12-15: qty 1

## 2022-12-15 MED ORDER — SODIUM CHLORIDE 0.9 % IV BOLUS
1000.0000 mL | Freq: Once | INTRAVENOUS | Status: AC
  Administered 2022-12-15: 1000 mL via INTRAVENOUS

## 2022-12-15 NOTE — ED Provider Notes (Addendum)
[facility] Provider Note    Event Date/Time   First MD Initiated Contact with Patient 12/15/22 0016     (approximate)   History   Chief Complaint: Seizures   HPI  Brian [name] is a 62 y.o. male anemia, poor nutrition who comes to the ED due to confusion and fatigue all day.  Reportedly patient has been drinking.  He has fallen down but no known head injury.  Patient denies pain currently.     Physical Exam   Triage Vital Signs: ED Triage Vitals [12/14/22 2233]  Encounter Vitals Group     BP (!) 158/106     Systolic BP Percentile      Diastolic BP Percentile      Pulse Rate (!) 110     Resp 16     Temp 98 F (36.7 C)     Temp Source Oral     SpO2 98 %     Weight      Height 5\' 10"  (1.778 m)     Head Circumference      Peak Flow      Pain Score 0     Pain Loc      Pain Education      Exclude from Growth Chart     Most recent vital signs: Vitals:   12/15/22 0430 12/15/22 0500  BP: 116/80 106/74  Pulse: 76 74  Resp: 13 13  Temp:    SpO2: 93% 98%    General: Awake, no distress.  CV:  Good peripheral perfusion.  Tachycardia heart rate 110.  Normal distal pulses Resp:  Normal effort.  Clear to auscultation bilaterally Abd:  No distention.  Soft nontender Other:  Dry mucous membranes   ED Results / Procedures / Treatments   Labs (all labs ordered are listed, but only abnormal results are displayed) Labs Reviewed  COMPREHENSIVE METABOLIC PANEL - Abnormal; Notable for the following components:      Result Value   CO2 20 (*)    Glucose, Bld 105 (*)    Total Protein 9.2 (*)    AST 48 (*)    All other components within normal limits  CBC WITH DIFFERENTIAL/PLATELET - Abnormal; Notable for the following components:   RDW 16.5 (*)    Neutro Abs 1.1 (*)    Lymphs Abs 4.1 (*)    All other components within normal limits  ETHANOL - Abnormal; Notable for the following components:   Alcohol, Ethyl (B) 374 (*)    All other  components within normal limits  URINE DRUG SCREEN, QUALITATIVE ([facility] ONLY)     EKG Interpreted by me Sinus tachycardia rate 116.  Normal axis, normal intervals.  Poor R wave progression.  Normal ST segments and T waves   RADIOLOGY    PROCEDURES:  Procedures   MEDICATIONS ORDERED IN ED: Medications  sodium chloride 0.9 % bolus 1,000 mL (0 mLs Intravenous Stopped 12/15/22 0215)  haloperidol lactate (HALDOL) injection 2 mg (2 mg Intravenous Given 12/15/22 0033)     IMPRESSION / MDM / ASSESSMENT AND PLAN / ED COURSE  I reviewed the triage vital signs and the nursing notes.  DDx: Anemia, electrolyte abnormality, alcohol intoxication, AKI, dehydration  Patient's presentation is most consistent with acute presentation with potential threat to life or bodily function.  Patient comes to the ED due to generalized weakness.  Appears intoxicated.  Alcohol level is 370.  Serum labs otherwise unremarkable.  Will give IV fluids.  Patient  is also mildly delirious, will give low-dose Haldol for the symptoms, observe in ED to ensure improvement.  No evidence of head trauma, doubt intracranial hemorrhage, stroke, meningitis.   ----------------------------------------- 6:02 AM on 12/15/2022 ----------------------------------------- Patient feeling better.  Mental status normal.  Clinically sober.  Stable for discharge with adult companion.      FINAL CLINICAL IMPRESSION(S) / ED DIAGNOSES   Final diagnoses:  Alcohol intoxication with delirium (HCC)     Rx / DC Orders   ED Discharge Orders     None        Note:  This document was prepared using Dragon voice recognition software and may include unintentional dictation errors.   Sharman Cheek, MD 12/15/22 4742    Sharman Cheek, MD 12/15/22 [phone]

## 2022-12-16 ENCOUNTER — Ambulatory Visit (INDEPENDENT_AMBULATORY_CARE_PROVIDER_SITE_OTHER): Payer: Self-pay | Admitting: *Deleted

## 2022-12-16 NOTE — Telephone Encounter (Signed)
He was seen for alcohol intoxication at the ED. Can you please give him a call and if he needs to be seen in person, he can be worked in with any Clinician with an opening or the mobile unit. Thanks.

## 2022-12-16 NOTE — Telephone Encounter (Signed)
Summary: pt fell and ED visit 7/21, shaking and hard time walking, denies pain, care taker wants to spk w PCP   **converting CRM into Clinical Call**  Reason for CRM: Pt sister Senaida Lange states that her brother fell out Saturday night and they had to call the Ambulance. Pt was taken to [facility]. Admitted 12/15/2022 12:10am and discharged 12/15/2022 6:29am. Burna Mortimer states that he left his discharge papers at the hospital and she is needing to know what to do for him and what the next steps should be. Burna Mortimer states that the pt is now staying with her because he has to be watched carefully. Burna Mortimer states that he is still shaking and having a hard time walking, pt is not complaining nor stating he is in pain, but per Burna Mortimer he looks like he is in pain. Burna Mortimer is wanting to talk with the pt PCP.   Call originally came in around 920 this morning.       Chief Complaint: worsening tremors in hands and difficulty walking using walker Symptoms: fell 12/15/22 went to ED. Taking eliquis. Shaking , tremors reported "hard time" walking and using walker. No pain .  Frequency: since Saturday  Pertinent Negatives: Patient denies chest pain no difficulty breathing no fever reported not drinking alcohol.  Disposition: [] ED /[] Urgent Care (no appt availability in office) / [] Appointment(In office/virtual)/ []  [facility]/ [] Home Care/ [] Refused Recommended Disposition /[] [facility]/ [x]  Follow-up with PCP Additional Notes:   Last OV 12/12/22. Patient would like  sister, Senaida Lange to be on DPR. May share information with her as needed. Recommended to have patient sister come to office with patient to sign DPR. Patient would like to know if drinking alcohol is allowed while taking eliquis . Recommended to contact pharmacist as well. Patient would like to know how long he will be taking eliquis. Patient would like to know if he should take baclofen for tremors and or gabapentin  again. Please adivse if another appt needed. Patient / sister would like a call back. Please advise.        Reason for Disposition  [1] MODERATE weakness (i.e., interferes with work, school, normal activities) AND [2] persists > 3 days  Answer Assessment - Initial Assessment Questions 1. DESCRIPTION: "Describe how you are feeling."     Weakness, tremors worsening in hands difficulty walking with walker 2. SEVERITY: "How bad is it?"  "Can you stand and walk?"   - MILD (0-3): Feels weak or tired, but does not interfere with work, school or normal activities.   - MODERATE (4-7): Able to stand and walk; weakness interferes with work, school, or normal activities.   - SEVERE (8-10): Unable to stand or walk; unable to do usual activities.     Worsening tremors in hands and difficulty walking with walker  3. ONSET: "When did these symptoms begin?" (e.g., hours, days, weeks, months)     Worse since Saturday  4. CAUSE: "What do you think is causing the weakness or fatigue?" (e.g., not drinking enough fluids, medical problem, trouble sleeping)     Not sure  5. NEW MEDICINES:  "Have you started on any new medicines recently?" (e.g., opioid pain medicines, benzodiazepines, muscle relaxants, antidepressants, antihistamines, neuroleptics, beta blockers)     na 6. OTHER SYMPTOMS: "Do you have any other symptoms?" (e.g., chest pain, fever, cough, SOB, vomiting, diarrhea, bleeding, other areas of pain)     Taking eliquis , fell 12/15/22 evaluated in ED and discharge  home. Now c/o worsening tremors and difficulty walking 7. PREGNANCY: "Is there any chance you are pregnant?" "When was your last menstrual period?"     na  Protocols used: Weakness (Generalized) and Fatigue-A-AH

## 2022-12-16 NOTE — Telephone Encounter (Signed)
Will forward to covering provider.

## 2022-12-17 NOTE — Telephone Encounter (Signed)
Call placed to patient unable to reach message left on VM.   

## 2022-12-18 NOTE — Telephone Encounter (Addendum)
Spoke with patient . Verified name & DOB    Patient given an appointment on 12/20/2022 Patient requested that I called POA  Senaida Lange) and give her information in order for him to have access to [facility]. Advised patient that he does not have Senaida Lange on a [place]. Advised that when he comes in for his appointment he should add her to [facility] so that we can to release information to her.

## 2022-12-20 ENCOUNTER — Encounter: Payer: Self-pay | Admitting: Internal Medicine

## 2022-12-20 ENCOUNTER — Ambulatory Visit: Payer: 59 | Attending: Internal Medicine | Admitting: Internal Medicine

## 2022-12-20 VITALS — BP 130/83 | HR 79 | Temp 98.1°F | Ht 70.0 in | Wt 131.0 lb

## 2022-12-20 DIAGNOSIS — M62838 Other muscle spasm: Secondary | ICD-10-CM | POA: Diagnosis not present

## 2022-12-20 DIAGNOSIS — Z86711 Personal history of pulmonary embolism: Secondary | ICD-10-CM

## 2022-12-20 DIAGNOSIS — G8250 Quadriplegia, unspecified: Secondary | ICD-10-CM | POA: Diagnosis not present

## 2022-12-20 DIAGNOSIS — F1010 Alcohol abuse, uncomplicated: Secondary | ICD-10-CM

## 2022-12-20 DIAGNOSIS — F17210 Nicotine dependence, cigarettes, uncomplicated: Secondary | ICD-10-CM | POA: Diagnosis not present

## 2022-12-20 DIAGNOSIS — R296 Repeated falls: Secondary | ICD-10-CM

## 2022-12-20 NOTE — Patient Instructions (Signed)
We have referred you to physical therapy and to physical medicine and rehabilitation specialist. Continue to use your walker. Cut back to drinking no more than  two 12 oz beers in a setting.  Finish out your current bottle of Eliquis.  After that you can stop the Eliquis.

## 2022-12-20 NOTE — Progress Notes (Signed)
Patient ID: RORAN RELF, male    DOB: 14-Dec-1960  MRN: [id number]  CC: ER f/u  Subjective: Brian [name] is a 62 y.o. male who presents for ER f/u.  PCP is Gwinda Passe at [facility] His concerns today include:  Patient with history of tobacco dependence, cervical myelopathy/spinal stenosis with cord compression status post surgery C3-7 on 06/2021, PE 06/2021, anemia, poor nutrition, EtOH use disorder  Presents as f/u from ER visit 12/15/2022 According to ER note, patient was seen due to confusion and fatigue.  Patient was also intoxicated with an alcohol level of 370.  Patient improved after IV hydration. -He tells me today that he was drinking with friends and legs gave out causing him to fall.  Friends called EMS for him.  Had drunk two 24 oz beer that were much stronger than the beers he normally would drink.  Denies issue with alcohol.  States that he normally drinks with few cans of beer on the weekends. -Currently staying with his sister. -Reports recurrent falls ever since surgery on the cervical spine in February of last year.  States that he has weakness in legs and arms.  He gets intermittent spasms in all 4 extremities and this causes him to lose his balance and fall.  Was hospitalized from February until June 2023.  Had to learn to swallow walk and use his hands again after surgery. -Ambulates with a rolling walker. -He is on Eliquis for PE.  Had PE during hospitalization in February of last year.  No previous history of blood clots.  No family history of blood clots.  States that he was supposed to meet with his PCP to determine whether he can come off the Eliquis.  Patient denies any street drug use. He is a light smoker.  Smokes about 2 cigarettes a day. Patient Active Problem List   Diagnosis Date Noted   Aspiration pneumonia (HCC)    Normocytic anemia 09/04/2021   Fall during current hospitalization 08/30/2021   Lumbar back pain 08/27/2021   Urinary retention  07/26/2021   Abdominal distention    Acute respiratory failure with hypoxia (HCC) 07/15/2021   Malnutrition of moderate degree 07/15/2021   Acute pulmonary embolism (HCC) 07/14/2021   Sinus tachycardia 07/13/2021   Tobacco use disorder 07/11/2021   Physical deconditioning 07/11/2021   Protein-calorie malnutrition, severe (HCC) 07/05/2021   Cervical myelopathy (HCC); severe cervical spinal stenosis with cord compression s/p ACDF C3-7 on 2/3, complicated by epidural hematoma s/p evacuation 2/7 06/29/2021     Current Outpatient Medications on File Prior to Visit  Medication Sig Dispense Refill   apixaban (ELIQUIS) 5 MG TABS tablet take 1 tablet by mouth twice daily 60 tablet 1   gabapentin (NEURONTIN) 300 MG capsule Take 1 capsule (300 mg total) by mouth 3 (three) times daily. 90 capsule 0   baclofen (LIORESAL) 10 MG tablet Take 2.5 tablets (25 mg total) by mouth 3 (three) times daily. (Patient not taking: Reported on 12/20/2022) 45 tablet 0   No current facility-administered medications on file prior to visit.    No Known Allergies  Social History   Socioeconomic History   Marital status: Single    Spouse name: Not on file   Number of children: Not on file   Years of education: Not on file   Highest education level: Not on file  Occupational History   Not on file  Tobacco Use   Smoking status: Every Day    Current packs/day: 0.33  Types: Cigarettes   Smokeless tobacco: Never  Vaping Use   Vaping status: Never Used  Substance and Sexual Activity   Alcohol use: Not Currently    Comment: beers on weekends only   Drug use: Yes    Types: Marijuana    Comment: only on weekends   Sexual activity: Not on file  Other Topics Concern   Not on file  Social History Narrative   Not on file   Social Determinants of Health   Financial Resource Strain: Not on file  Food Insecurity: Not on file  Transportation Needs: Not on file  Physical Activity: Not on file  Stress: Not on  file  Social Connections: Not on file  Intimate Partner Violence: Not on file    History reviewed. No pertinent family history.  Past Surgical History:  Procedure Laterality Date   ANTERIOR CERVICAL DECOMPRESSION/DISCECTOMY FUSION 4 LEVELS N/A 06/29/2021   Procedure: Anterior Cervical Discectomy Fusion - Cervical Three-Cervical Four - Cervical Four- Cervical Five - Cervical Five- Cervical Six - Cervical Six- Cervical Seven;  Surgeon: Julio Sicks, MD;  Location: [facility] OR;  Service: Neurosurgery;  Laterality: N/A;   HEMATOMA EVACUATION N/A 07/03/2021   Procedure: EXPLORATION OF NECK AND REMOVE BLOOD CLOT;  Surgeon: Julio Sicks, MD;  Location: [facility] OR;  Service: Neurosurgery;  Laterality: N/A;   IR GASTROSTOMY TUBE MOD SED  07/30/2021   IR REPLC GASTRO/COLONIC TUBE PERCUT W/FLUORO  08/17/2021    ROS: Review of Systems Negative except as stated above  PHYSICAL EXAM: BP 130/83 (BP Location: Left Arm, Patient Position: Sitting, Cuff Size: Normal)   Pulse 79   Temp 98.1 F (36.7 C) (Oral)   Ht 5\' 10"  (1.778 m)   Wt 131 lb (59.4 kg)   SpO2 99%   BMI 18.80 kg/m   Physical Exam   General appearance -alert older African-American male in NAD.  He looks underweight for height. Mental status - normal mood, behavior, speech, dress, motor activity, and thought processes Chest - clear to auscultation, no wheezes, rales or rhonchi, symmetric air entry Heart - normal rate, regular rhythm, normal S1, S2, no murmurs, rubs, clicks or gallops Neuro: He has some wasting of intrinsic hand muscles bilaterally.  Seems to have decreased muscle mass in the legs.  Grip 2/5 BL.  Power in the upper extremities 3/5 with a lot of spasticity.  Power in the lower extremities 3/5 bilaterally proximally and distally with spasticity Gait: Ambulates with a rolling walker.  He has a stooped posture.  He has very low foot to floor clearance Extremities -no lower extremity edema     Latest Ref Rng & Units 12/14/2022   10:36 PM  10/08/2022    3:07 PM 06/24/2022    4:06 PM  CMP  Glucose 70 - 99 mg/dL 657  94  846   BUN 8 - 23 mg/dL 9  9  15    Creatinine 0.61 - 1.24 mg/dL 9.62  9.52  8.41   Sodium 135 - 145 mmol/L 136  134  137   Potassium 3.5 - 5.1 mmol/L 4.0  3.7  4.1   Chloride 98 - 111 mmol/L 104  101  103   CO2 22 - 32 mmol/L 20  22  22    Calcium 8.9 - 10.3 mg/dL 8.9  9.3  9.7   Total Protein 6.5 - 8.1 g/dL 9.2  7.7  7.5   Total Bilirubin 0.3 - 1.2 mg/dL 0.6  0.5  0.8   Alkaline Phos 38 -  126 U/L 85  87  104   AST 15 - 41 U/L 48  73  28   ALT 0 - 44 U/L 22  38  11    Lipid Panel     Component Value Date/Time   CHOL 132 06/24/2022 1606   TRIG 89 06/24/2022 1606   HDL 46 06/24/2022 1606   CHOLHDL 2.9 06/24/2022 1606   LDLCALC 69 06/24/2022 1606    CBC    Component Value Date/Time   WBC 6.4 12/14/2022 2236   RBC 4.44 12/14/2022 2236   HGB 14.7 12/14/2022 2236   HGB 12.8 (L) 06/24/2022 1606   HCT 41.9 12/14/2022 2236   HCT 38.4 06/24/2022 1606   PLT 171 12/14/2022 2236   PLT 240 06/24/2022 1606   MCV 94.4 12/14/2022 2236   MCV 96 06/24/2022 1606   MCH 33.1 12/14/2022 2236   MCHC 35.1 12/14/2022 2236   RDW 16.5 (H) 12/14/2022 2236   RDW 14.0 06/24/2022 1606   LYMPHSABS 4.1 (H) 12/14/2022 2236   LYMPHSABS 2.5 06/24/2022 1606   MONOABS 0.7 12/14/2022 2236   EOSABS 0.3 12/14/2022 2236   EOSABS 0.2 06/24/2022 1606   BASOSABS 0.0 12/14/2022 2236   BASOSABS 0.0 06/24/2022 1606    ASSESSMENT AND PLAN:  1. Quadriparesis (HCC) 2. Muscle spasticity -Post surgery on cervical spine for cervical myelopathy. -I think patient would benefit from some physical therapy and referral to PMR. Advised to avoid binge drinking.  Went over how much is too much for male in 1 setting which is usually no more than 2 standard drinks which for him would be two 12 oz beers/days.  Advised to avoid excessive drinking as this can contribute to falls - Ambulatory referral to Physical Medicine Rehab - Ambulatory  referral to Physical Therapy  3. History of pulmonary embolism Given that his blood clot occurred in the setting of decreased immobilization during hospitalization and no prior history of blood clots or family history of blood clots, I think he can stop the Eliquis when he completes his current bottle.  4. Light smoker Strongly advised him to quit completely.  Discussed health risks associated with smoking.  5. Alcohol consumption binge drinking See #1 above.  6. Recurrent falls See #1 above.    Patient was given the opportunity to ask questions.  Patient verbalized understanding of the plan and was able to repeat key elements of the plan.   This documentation was completed using Paediatric nurse.  Any transcriptional errors are unintentional.  Orders Placed This Encounter  Procedures   Ambulatory referral to Physical Medicine Rehab   Ambulatory referral to Physical Therapy     Requested Prescriptions    No prescriptions requested or ordered in this encounter    Return in about 6 weeks (around 01/31/2023) for Give the 6 wks f/u appt with Gwinda Passe at [facility].  Jonah Blue, MD, FACP

## 2022-12-27 NOTE — Telephone Encounter (Signed)
Opened in error

## 2022-12-31 ENCOUNTER — Encounter: Payer: Self-pay | Admitting: Physical Medicine and Rehabilitation

## 2023-01-03 ENCOUNTER — Ambulatory Visit: Payer: 59 | Attending: Internal Medicine

## 2023-01-03 DIAGNOSIS — M6281 Muscle weakness (generalized): Secondary | ICD-10-CM | POA: Insufficient documentation

## 2023-01-03 DIAGNOSIS — R2689 Other abnormalities of gait and mobility: Secondary | ICD-10-CM | POA: Diagnosis present

## 2023-01-03 DIAGNOSIS — G8250 Quadriplegia, unspecified: Secondary | ICD-10-CM | POA: Diagnosis not present

## 2023-01-03 NOTE — Addendum Note (Signed)
Addended by: Ileana Ladd on: 01/03/2023 01:15 PM   Modules accepted: Orders

## 2023-01-03 NOTE — Therapy (Addendum)
OUTPATIENT PHYSICAL THERAPY NEURO EVALUATION   Patient Name: Brian [name] MRN: [id number] DOB:03-05-1961, 62 y.o., male Today's Date: 01/03/2023   PCP: Gwinda Passe, NP REFERRING PROVIDER: Marcine Matar, MD  END OF SESSION:  PT End of Session - 01/03/23 1142     Visit Number 1    Number of Visits 13    Date for PT Re-Evaluation 03/14/23    Authorization Type Medicaid [employer]    PT Start Time 1100    PT Stop Time 1145    PT Time Calculation (min) 45 min    Equipment Utilized During Treatment Gait belt    Activity Tolerance Patient tolerated treatment well    Behavior During Therapy WFL for tasks assessed/performed             History reviewed. No pertinent past medical history. Past Surgical History:  Procedure Laterality Date   ANTERIOR CERVICAL DECOMPRESSION/DISCECTOMY FUSION 4 LEVELS N/A 06/29/2021   Procedure: Anterior Cervical Discectomy Fusion - Cervical Three-Cervical Four - Cervical Four- Cervical Five - Cervical Five- Cervical Six - Cervical Six- Cervical Seven;  Surgeon: Julio Sicks, MD;  Location: [facility] OR;  Service: Neurosurgery;  Laterality: N/A;   HEMATOMA EVACUATION N/A 07/03/2021   Procedure: EXPLORATION OF NECK AND REMOVE BLOOD CLOT;  Surgeon: Julio Sicks, MD;  Location: [facility] OR;  Service: Neurosurgery;  Laterality: N/A;   IR GASTROSTOMY TUBE MOD SED  07/30/2021   IR REPLC GASTRO/COLONIC TUBE PERCUT W/FLUORO  08/17/2021   Patient Active Problem List   Diagnosis Date Noted   Aspiration pneumonia (HCC)    Normocytic anemia 09/04/2021   Fall during current hospitalization 08/30/2021   Lumbar back pain 08/27/2021   Urinary retention 07/26/2021   Abdominal distention    Acute respiratory failure with hypoxia (HCC) 07/15/2021   Malnutrition of moderate degree 07/15/2021   Acute pulmonary embolism (HCC) 07/14/2021   Sinus tachycardia 07/13/2021   Tobacco use disorder 07/11/2021   Physical deconditioning 07/11/2021   Protein-calorie malnutrition,  severe (HCC) 07/05/2021   Cervical myelopathy (HCC); severe cervical spinal stenosis with cord compression s/p ACDF C3-7 on 2/3, complicated by epidural hematoma s/p evacuation 2/7 06/29/2021    ONSET DATE: 12/20/2022  REFERRING DIAG: G82.50 (ICD-10-CM) - Quadriparesis (HCC)  THERAPY DIAG:  Muscle weakness (generalized)  Other abnormalities of gait and mobility  Balance disorder  Rationale for Evaluation and Treatment: Rehabilitation  SUBJECTIVE:                                                                                                                                                                                             SUBJECTIVE STATEMENT:  Reports recurrent falls ever since surgery on the cervical spine in February of last year.  States that he has weakness in legs and arms.  He gets intermittent spasms in all 4 extremities and this causes him to lose his balance and fall.  Pt reports constant numbness in bil hands and intermittent numbness in his feet. Was hospitalized from February until June 2023.  Had to learn to swallow walk and use his hands again after surgery. -Ambulates with a rolling walker. -He is on Eliquis for PE.  Had PE during hospitalization in February of last year. Pt had no OP therapy after his discharged from hospital. He had home health PT for 3 sessions.  Pt reports his MD stopped his Gabapentin about 2 months ago. When he was taking it, his pain in his hands was 4-5/10, currently it is 10/10. Pt also reports that his tremors were slightly better when he was on Baclofen. Pt accompanied by: self  PERTINENT HISTORY: Patient with history of tobacco dependence, cervical myelopathy/spinal stenosis with cord compression status post surgery C3-7 on 06/2021, PE 06/2021, anemia, poor nutrition, EtOH use disorder  PAIN:  Are you having pain? Yes: NPRS scale: 10/10 Pain location: bil hands Pain description: neuropathic pain Aggravating factors: none Relieving  factors: none  PRECAUTIONS: C3-7 ACDF, falls    WEIGHT BEARING RESTRICTIONS: No  FALLS: Has patient fallen in last 6 months? Yes. Number of falls 15 , multiple near falls   LIVING ENVIRONMENT: Lives with:  brother Lives in: House/apartment Stairs: Yes: {in the back porch has 2 stairs but doesn't go there. Has ramped entry in front which he uses.  Has following equipment at home: Dan Humphreys - 2 wheeled  PLOF: Requires assistive device for independence, Needs assistance with ADLs, and Needs assistance with homemaking  PATIENT GOALS: improve balance  OBJECTIVE:   DIAGNOSTIC FINDINGS: 09/2022 MRI of lumbar spine IMPRESSION: No evidence of lumbar spine fracture.   Unchanged multilevel degenerative disc disease, moderate-severe at L3-L4 and L5-S1.   Trace degenerative retrolisthesis at L3-L4 and anterolisthesis at L4-L5.   Mild to moderate lower lumbar predominant facet arthropathy.  07/03/21 C spine MRI: IMPRESSION: 1. Postoperative changes of recent C3-C7 ACDF. Ventral epidural hemorrhage/fluid extending from C2 inferiorly to C7 and extending into the postoperative disc spaces at these levels. Resulting severe canal stenosis at these levels with compression of the cord and possible cord edema, detailed above. 2. Superimposed multilevel degenerative change with severe multilevel foraminal stenosis. 3. Prevertebral soft tissue swelling better characterized on recent CTA.  COGNITION: Overall cognitive status: Within functional limits for tasks assessed   LOWER EXTREMITY ROM:     Active  Right Eval Left Eval  Hip flexion 5 5  Hip extension    Hip abduction    Hip adduction    Hip internal rotation    Hip external rotation    Knee flexion 4 4  Knee extension 4 4  Ankle dorsiflexion 5 5  Ankle plantarflexion    Ankle inversion    Ankle eversion     (Blank rows = not tested)  LOWER EXTREMITY MMT:    MMT Right Eval Left Eval  Hip flexion    Hip extension    Hip  abduction    Hip adduction    Hip internal rotation    Hip external rotation    Knee flexion    Knee extension    Ankle dorsiflexion    Ankle plantarflexion    Ankle inversion    Ankle  eversion    (Blank rows = not tested)   TRANSFERS: Assistive device utilized: Environmental consultant - 2 wheeled  Sit to stand: Modified independence Stand to sit: Modified independence Chair to chair: Modified independence  GAIT: Gait pattern: decreased arm swing- Right, decreased arm swing- Left, decreased step length- Right, decreased step length- Left, decreased stride length, Right foot flat, Left foot flat, knee flexed in stance- Right, knee flexed in stance- Left, antalgic, decreased trunk rotation, poor foot clearance- Right, and poor foot clearance- Left Distance walked: 115' Assistive device utilized: Environmental consultant - 2 wheeled Level of assistance: Modified independence Comments: Pt had significant tremors where he couldn't move while walking and had to wait until his tremors passed.  FUNCTIONAL TESTS:  5 times sit to stand: 19 sec no UE support Timed up and go (TUG): 22.75 sec with RW, poor safety awareness with sit to stand where he sits sideways without turning fully and leaves walker to the side.  10 meter walk test: 0.38 m/s with RW Berg Balance Scale: 44/56   TODAY'S TREATMENT:                                                                                                                              DATE:  Pt was educated to talk to his PCP regarding Gabapentin and potentially re-starting it since his neuropathic pain was significantly better when he was on it vs when he stopped taking it.     PATIENT EDUCATION: Education details: see above Person educated: Patient Education method: Explanation Education comprehension: verbalized understanding  HOME EXERCISE PROGRAM: TBD  GOALS: Goals reviewed with patient? Yes  SHORT TERM GOALS: Target date: 01/31/2023    Patient will demo improved safety  awareness with performing sit to stand transfers to reduce fall risk Baseline: Pt currently approaches sitting sideways from walker and leaves walker to the side (eval) Goal status: INITIAL  2.  Patient will demo 30-50% compliance with HEP for self management of his symtpoms. Baseline: TBD during 2nd session. Goal status: INITIAL  3.  LONG TERM GOALS: Target date: 03/14/2023    Patient will demo TUG score of <16 sec with RW to improve functional mobility and reduce fall risk  Baseline: 22.75 sec (01/03/23) Goal status: INITIAL  2.  Patient will demo BBS of >50/56 to improve standing balance and reduce fall risk. Baseline: 44/56 (01/03/23) Goal status: INITIAL  3.  Patient will demo gait speed of >0.5 m/s to improve functional mobility and reduce fall risk with RW Baseline: 0.38 m/s with RW (01/03/23) Goal status: INITIAL  4.  Patient will demo score of <15 sec with 5 x sit to stand without UE support to improve functional strength of bil LE. Baseline: 19 sec no UE support Goal status: INITIAL   ASSESSMENT:  CLINICAL IMPRESSION: Patient is a 62 y.o. male who was seen today for physical therapy evaluation and treatment for gait and balance disorder. Patient has PMH of spinal cord  compression and C3-7 ACDF. Patient reports recurrent falls after the surgery due to muscle weakness.  Patient also has significant tremors that is impacting his gait and mobility and pt reports his tremors have contributed to previous falls. Patient is at high risk for fall based on above functional tests (Berg balance scale, TUG, gait speed and 5x sit to stand) test. Patient will benefit from skilled PT to address his functional gait, balance, and mobility impairments and improve safety awareness to reduce fall risk.  OBJECTIVE IMPAIRMENTS: decreased balance, decreased mobility, difficulty walking, decreased strength, increased muscle spasms, and impaired UE functional use.   ACTIVITY LIMITATIONS: lifting,  bending, standing, squatting, and stairs  PARTICIPATION LIMITATIONS: cleaning, laundry, shopping, community activity, and yard work  PERSONAL FACTORS: Age and Time since onset of injury/illness/exacerbation are also affecting patient's functional outcome.   REHAB POTENTIAL: Good  CLINICAL DECISION MAKING: Stable/uncomplicated  EVALUATION COMPLEXITY: Low  PLAN:  PT FREQUENCY: 2x/week  PT DURATION: 6 weeks  PLANNED INTERVENTIONS: Therapeutic exercises, Therapeutic activity, Neuromuscular re-education, Balance training, Gait training, Patient/Family education, Self Care, Joint mobilization, Stair training, Orthotic/Fit training, Aquatic Therapy, Spinal mobilization, Cryotherapy, Moist heat, Traction, Manual therapy, and Re-evaluation  PLAN FOR NEXT SESSION:  Check on OT order  Did patient call PCP to discuss Gabapentin and neurologist referral? Issue HEP Work on safety awareness with sit to stand, chair to chair transfer with proper RW placement and hand placement. Excessive knee flexion bil: any passive restrictions in knees? Or functional weakness?    Ileana Ladd, PT 01/03/2023, 1:15 PM

## 2023-01-08 ENCOUNTER — Ambulatory Visit: Payer: 59

## 2023-01-08 DIAGNOSIS — M6281 Muscle weakness (generalized): Secondary | ICD-10-CM

## 2023-01-08 DIAGNOSIS — R2689 Other abnormalities of gait and mobility: Secondary | ICD-10-CM

## 2023-01-08 NOTE — Therapy (Signed)
OUTPATIENT PHYSICAL THERAPY NEURO EVALUATION   Patient Name: CLIFORD DABISH MRN: [id number] DOB:[birth year], 62 y.o., male Today's Date: 01/08/2023   PCP: Gwinda Passe, NP REFERRING PROVIDER: Marcine Matar, MD  END OF SESSION:  PT End of Session - 01/08/23 0846     Visit Number 2    Number of Visits 13    Date for PT Re-Evaluation 03/14/23    Authorization Type Medicaid [employer]    PT Start Time 0845    PT Stop Time 0930    PT Time Calculation (min) 45 min    Equipment Utilized During Treatment Gait belt    Activity Tolerance Patient tolerated treatment well    Behavior During Therapy WFL for tasks assessed/performed             History reviewed. No pertinent past medical history. Past Surgical History:  Procedure Laterality Date   ANTERIOR CERVICAL DECOMPRESSION/DISCECTOMY FUSION 4 LEVELS N/A 06/29/2021   Procedure: Anterior Cervical Discectomy Fusion - Cervical Three-Cervical Four - Cervical Four- Cervical Five - Cervical Five- Cervical Six - Cervical Six- Cervical Seven;  Surgeon: Julio Sicks, MD;  Location: [facility] OR;  Service: Neurosurgery;  Laterality: N/A;   HEMATOMA EVACUATION N/A 07/03/2021   Procedure: EXPLORATION OF NECK AND REMOVE BLOOD CLOT;  Surgeon: Julio Sicks, MD;  Location: [facility] OR;  Service: Neurosurgery;  Laterality: N/A;   IR GASTROSTOMY TUBE MOD SED  07/30/2021   IR REPLC GASTRO/COLONIC TUBE PERCUT W/FLUORO  08/17/2021   Patient Active Problem List   Diagnosis Date Noted   Aspiration pneumonia (HCC)    Normocytic anemia 09/04/2021   Fall during current hospitalization 08/30/2021   Lumbar back pain 08/27/2021   Urinary retention 07/26/2021   Abdominal distention    Acute respiratory failure with hypoxia (HCC) 07/15/2021   Malnutrition of moderate degree 07/15/2021   Acute pulmonary embolism (HCC) 07/14/2021   Sinus tachycardia 07/13/2021   Tobacco use disorder 07/11/2021   Physical deconditioning 07/11/2021   Protein-calorie malnutrition,  severe (HCC) 07/05/2021   Cervical myelopathy (HCC); severe cervical spinal stenosis with cord compression s/p ACDF C3-7 on 2/3, complicated by epidural hematoma s/p evacuation 2/7 06/29/2021    ONSET DATE: 12/20/2022  REFERRING DIAG: G82.50 (ICD-10-CM) - Quadriparesis (HCC)  THERAPY DIAG:  Muscle weakness (generalized)  Other abnormalities of gait and mobility  Rationale for Evaluation and Treatment: Rehabilitation  SUBJECTIVE:                                                                                                                                                                                             SUBJECTIVE STATEMENT: Pt reports he  has called MD office and expecting return call back regarding gabapentin and baclofen meds. Pt reports no falls since last session. Pt accompanied by: self  PERTINENT HISTORY: Patient with history of tobacco dependence, cervical myelopathy/spinal stenosis with cord compression status post surgery C3-7 on 06/2021, PE 06/2021, anemia, poor nutrition, EtOH use disorder  PAIN:  Are you having pain? Yes: NPRS scale: 10/10 Pain location: bil hands Pain description: neuropathic pain Aggravating factors: none Relieving factors: none  PRECAUTIONS: C3-7 ACDF, falls    WEIGHT BEARING RESTRICTIONS: No  FALLS: Has patient fallen in last 6 months? Yes. Number of falls 15 , multiple near falls   LIVING ENVIRONMENT: Lives with:  brother Lives in: House/apartment Stairs: Yes: {in the back porch has 2 stairs but doesn't go there. Has ramped entry in front which he uses.  Has following equipment at home: Dan Humphreys - 2 wheeled  PLOF: Requires assistive device for independence, Needs assistance with ADLs, and Needs assistance with homemaking  PATIENT GOALS: improve balance  OBJECTIVE:   DIAGNOSTIC FINDINGS: 09/2022 MRI of lumbar spine IMPRESSION: No evidence of lumbar spine fracture.   Unchanged multilevel degenerative disc disease,  moderate-severe at L3-L4 and L5-S1.   Trace degenerative retrolisthesis at L3-L4 and anterolisthesis at L4-L5.   Mild to moderate lower lumbar predominant facet arthropathy.  07/03/21 C spine MRI: IMPRESSION: 1. Postoperative changes of recent C3-C7 ACDF. Ventral epidural hemorrhage/fluid extending from C2 inferiorly to C7 and extending into the postoperative disc spaces at these levels. Resulting severe canal stenosis at these levels with compression of the cord and possible cord edema, detailed above. 2. Superimposed multilevel degenerative change with severe multilevel foraminal stenosis. 3. Prevertebral soft tissue swelling better characterized on recent CTA.  COGNITION: Overall cognitive status: Within functional limits for tasks assessed   LOWER EXTREMITY ROM:     Active  Right Eval Left Eval  Hip flexion 5 5  Hip extension    Hip abduction    Hip adduction    Hip internal rotation    Hip external rotation    Knee flexion 4 4  Knee extension 4 4  Ankle dorsiflexion 5 5  Ankle plantarflexion    Ankle inversion    Ankle eversion     (Blank rows = not tested)  LOWER EXTREMITY MMT:    MMT Right Eval Left Eval  Hip flexion    Hip extension    Hip abduction    Hip adduction    Hip internal rotation    Hip external rotation    Knee flexion    Knee extension    Ankle dorsiflexion    Ankle plantarflexion    Ankle inversion    Ankle eversion    (Blank rows = not tested)  FUNCTIONAL TESTS (eval) 5 times sit to stand: 19 sec no UE support Timed up and go (TUG): 22.75 sec with RW, poor safety awareness with sit to stand where he sits sideways without turning fully and leaves walker to the side.  10 meter walk test: 0.38 m/s with RW Berg Balance Scale: 44/56   TODAY'S TREATMENT:  DATE:  Supine quad sets: 10 x 5" holds, max verbal  cueing required- pt reporting painful knee extension, pt educated on pushing knee into extension with mild discomfort but no sharp pain SAQ: 2 x 10 R and L, notable tremors with L LE during exercise L>R where pt unable to perform exercise and had to stop until tremors passed. Heel slides: AA: 2 x 10 R and L, tremors on R LE with this exercise where pt needed break until it stopped. Supine hooklying hip adductor stretch: 3 x 20" bil- tremors occurred here as well. Gait training: 1 x 200' cues for standing erect and extending knees with stance phase. Standing calf stretch on slant board: 3 x 30" bil Sci fit: manual level 4 for 5'   PATIENT EDUCATION: Education details: see above Person educated: Patient Education method: Explanation Education comprehension: verbalized understanding  HOME EXERCISE PROGRAM: Access Code: [email or web address] URL: [email or web address] Date: 01/08/2023 Prepared by: Lavone Nian  Exercises - Long Sitting Quad Set  - 1 x daily - 7 x weekly - 10 reps - 5 sec hold - Supine Knee Extension Strengthening  - 1 x daily - 7 x weekly - 2 sets - 10 reps - Hooklying Heel Slide  - 1 x daily - 7 x weekly - 10 reps - Supine Hip Adductor Stretch  - 1 x daily - 7 x weekly - 3 reps - 20 sec hold  GOALS: Goals reviewed with patient? Yes  SHORT TERM GOALS: Target date: 01/31/2023    Patient will demo improved safety awareness with performing sit to stand transfers to reduce fall risk Baseline: Pt currently approaches sitting sideways from walker and leaves walker to the side (eval) Goal status: INITIAL  2.  Patient will demo 30-50% compliance with HEP for self management of his symtpoms. Baseline: TBD during 2nd session. Goal status: INITIAL  3.  LONG TERM GOALS: Target date: 03/14/2023    Patient will demo TUG score of <16 sec with RW to improve functional mobility and reduce fall risk  Baseline: 22.75 sec (01/03/23) Goal status: INITIAL  2.  Patient will  demo BBS of >50/56 to improve standing balance and reduce fall risk. Baseline: 44/56 (01/03/23) Goal status: INITIAL  3.  Patient will demo gait speed of >0.5 m/s to improve functional mobility and reduce fall risk with RW Baseline: 0.38 m/s with RW (01/03/23) Goal status: INITIAL  4.  Patient will demo score of <15 sec with 5 x sit to stand without UE support to improve functional strength of bil LE. Baseline: 19 sec no UE support Goal status: INITIAL   ASSESSMENT:  CLINICAL IMPRESSION: Today's skilled session focused on establishing HEP consisting of basic LE flexibility exercises to improve flexibility and pain in bil LE. Due to tremors and spasms, patient has increased tone in muscles of LE and increased pain and difficulty with moving extremity with bed mobility and transfers. Pt demo improved ability to extend knees with mid to late stance phase after flexibility exercises. Patient didn't have spasms with small bout of gait today after exercises.  OBJECTIVE IMPAIRMENTS: decreased balance, decreased mobility, difficulty walking, decreased strength, increased muscle spasms, and impaired UE functional use.   ACTIVITY LIMITATIONS: lifting, bending, standing, squatting, and stairs  PARTICIPATION LIMITATIONS: cleaning, laundry, shopping, community activity, and yard work  PERSONAL FACTORS: Age and Time since onset of injury/illness/exacerbation are also affecting patient's functional outcome.   REHAB POTENTIAL: Good  CLINICAL DECISION MAKING: Stable/uncomplicated  EVALUATION COMPLEXITY: Low  PLAN:  PT  FREQUENCY: 2x/week  PT DURATION: 6 weeks  PLANNED INTERVENTIONS: Therapeutic exercises, Therapeutic activity, Neuromuscular re-education, Balance training, Gait training, Patient/Family education, Self Care, Joint mobilization, Stair training, Orthotic/Fit training, Aquatic Therapy, Spinal mobilization, Cryotherapy, Moist heat, Traction, Manual therapy, and Re-evaluation  PLAN FOR  NEXT SESSION:  Check on OT order  Did patient call PCP to discuss Gabapentin and neurologist referral? Issue HEP Work on safety awareness with sit to stand, chair to chair transfer with proper RW placement and hand placement. Excessive knee flexion bil: any passive restrictions in knees? Or functional weakness?    Ileana Ladd, PT 01/08/2023, 8:47 AM

## 2023-01-15 ENCOUNTER — Ambulatory Visit: Payer: 59

## 2023-01-15 DIAGNOSIS — M6281 Muscle weakness (generalized): Secondary | ICD-10-CM | POA: Diagnosis not present

## 2023-01-15 DIAGNOSIS — R2689 Other abnormalities of gait and mobility: Secondary | ICD-10-CM

## 2023-01-15 NOTE — Therapy (Signed)
OUTPATIENT PHYSICAL THERAPY NEURO EVALUATION   Patient Name: Brian [name] MRN: [id number] DOB:December 30, 1960, 62 y.o., male Today's Date: 01/15/2023   PCP: Gwinda Passe, NP REFERRING PROVIDER: Marcine Matar, MD  END OF SESSION:  PT End of Session - 01/15/23 1016     Visit Number 3    Number of Visits 13    Date for PT Re-Evaluation 03/14/23    Authorization Type Medicaid [employer]    PT Start Time 1015    PT Stop Time 1055    PT Time Calculation (min) 40 min    Equipment Utilized During Treatment Gait belt    Activity Tolerance Patient tolerated treatment well    Behavior During Therapy WFL for tasks assessed/performed             No past medical history on file. Past Surgical History:  Procedure Laterality Date   ANTERIOR CERVICAL DECOMPRESSION/DISCECTOMY FUSION 4 LEVELS N/A 06/29/2021   Procedure: Anterior Cervical Discectomy Fusion - Cervical Three-Cervical Four - Cervical Four- Cervical Five - Cervical Five- Cervical Six - Cervical Six- Cervical Seven;  Surgeon: Julio Sicks, MD;  Location: [facility] OR;  Service: Neurosurgery;  Laterality: N/A;   HEMATOMA EVACUATION N/A 07/03/2021   Procedure: EXPLORATION OF NECK AND REMOVE BLOOD CLOT;  Surgeon: Julio Sicks, MD;  Location: [facility] OR;  Service: Neurosurgery;  Laterality: N/A;   IR GASTROSTOMY TUBE MOD SED  07/30/2021   IR REPLC GASTRO/COLONIC TUBE PERCUT W/FLUORO  08/17/2021   Patient Active Problem List   Diagnosis Date Noted   Aspiration pneumonia (HCC)    Normocytic anemia 09/04/2021   Fall during current hospitalization 08/30/2021   Lumbar back pain 08/27/2021   Urinary retention 07/26/2021   Abdominal distention    Acute respiratory failure with hypoxia (HCC) 07/15/2021   Malnutrition of moderate degree 07/15/2021   Acute pulmonary embolism (HCC) 07/14/2021   Sinus tachycardia 07/13/2021   Tobacco use disorder 07/11/2021   Physical deconditioning 07/11/2021   Protein-calorie malnutrition, severe (HCC)  07/05/2021   Cervical myelopathy (HCC); severe cervical spinal stenosis with cord compression s/p ACDF C3-7 on 2/3, complicated by epidural hematoma s/p evacuation 2/7 06/29/2021    ONSET DATE: 12/20/2022  REFERRING DIAG: G82.50 (ICD-10-CM) - Quadriparesis (HCC)  THERAPY DIAG:  Muscle weakness (generalized)  Other abnormalities of gait and mobility  Balance disorder  Rationale for Evaluation and Treatment: Rehabilitation  SUBJECTIVE:                                                                                                                                                                                             SUBJECTIVE STATEMENT: Pt  reports he had a fall in his bathroom about 4 days ago. He was at the sink and he felt tremors and weakness in his legs and fell backwards. He fell on his back and hit commode on his back. Currently he has back pain that is 10/10. Pt denies worsening symptoms in his legs, pt denies headaches and bowel and bladder changes. Pt accompanied by: self  PERTINENT HISTORY: Patient with history of tobacco dependence, cervical myelopathy/spinal stenosis with cord compression status post surgery C3-7 on 06/2021, PE 06/2021, anemia, poor nutrition, EtOH use disorder  PAIN:  Are you having pain? Yes: NPRS scale: 10/10 Pain location: bil hands Pain description: neuropathic pain Aggravating factors: none Relieving factors: none  PRECAUTIONS: C3-7 ACDF, falls    WEIGHT BEARING RESTRICTIONS: No  FALLS: Has patient fallen in last 6 months? Yes. Number of falls 15 , multiple near falls   LIVING ENVIRONMENT: Lives with:  brother Lives in: House/apartment Stairs: Yes: {in the back porch has 2 stairs but doesn't go there. Has ramped entry in front which he uses.  Has following equipment at home: Dan Humphreys - 2 wheeled  PLOF: Requires assistive device for independence, Needs assistance with ADLs, and Needs assistance with homemaking  PATIENT GOALS: improve  balance  OBJECTIVE:   DIAGNOSTIC FINDINGS: 09/2022 MRI of lumbar spine IMPRESSION: No evidence of lumbar spine fracture.   Unchanged multilevel degenerative disc disease, moderate-severe at L3-L4 and L5-S1.   Trace degenerative retrolisthesis at L3-L4 and anterolisthesis at L4-L5.   Mild to moderate lower lumbar predominant facet arthropathy.  07/03/21 C spine MRI: IMPRESSION: 1. Postoperative changes of recent C3-C7 ACDF. Ventral epidural hemorrhage/fluid extending from C2 inferiorly to C7 and extending into the postoperative disc spaces at these levels. Resulting severe canal stenosis at these levels with compression of the cord and possible cord edema, detailed above. 2. Superimposed multilevel degenerative change with severe multilevel foraminal stenosis. 3. Prevertebral soft tissue swelling better characterized on recent CTA.  COGNITION: Overall cognitive status: Within functional limits for tasks assessed   LOWER EXTREMITY ROM:     Active  Right Eval Left Eval  Hip flexion 5 5  Hip extension    Hip abduction    Hip adduction    Hip internal rotation    Hip external rotation    Knee flexion 4 4  Knee extension 4 4  Ankle dorsiflexion 5 5  Ankle plantarflexion    Ankle inversion    Ankle eversion     (Blank rows = not tested)  LOWER EXTREMITY MMT:    MMT Right Eval Left Eval  Hip flexion    Hip extension    Hip abduction    Hip adduction    Hip internal rotation    Hip external rotation    Knee flexion    Knee extension    Ankle dorsiflexion    Ankle plantarflexion    Ankle inversion    Ankle eversion    (Blank rows = not tested)  FUNCTIONAL TESTS (eval) 5 times sit to stand: 19 sec no UE support Timed up and go (TUG): 22.75 sec with RW, poor safety awareness with sit to stand where he sits sideways without turning fully and leaves walker to the side.  10 meter walk test: 0.38 m/s with RW Berg Balance Scale: 44/56   TODAY'S TREATMENT:  DATE:  Supine lower trunk rotations: 4 x 10 performed throught out the session Supine marching: 20x Sit to stand without UE support from EOT: tactile and verbal cues for forward leaning and not pushing back of knees on the table: required CGA Sit to stand with use of UE bil but educated pt to use arm rest for balance and not to push to stand up or to use them for WB when sitting back down: 5x Standing 12" cone taps: 2 x 10 min A for balance. Standing perturbations with blue sport band in lateral, AP directions to work on [employer] with balance: 5'  PATIENT EDUCATION: Education details: see above Person educated: Patient Education method: Explanation Education comprehension: verbalized understanding  HOME EXERCISE PROGRAM: Access Code: [email or web address] URL: [email or web address] Date: 01/08/2023 Prepared by: Lavone Nian  Exercises - Long Sitting Quad Set  - 1 x daily - 7 x weekly - 10 reps - 5 sec hold - Supine Knee Extension Strengthening  - 1 x daily - 7 x weekly - 2 sets - 10 reps - Hooklying Heel Slide  - 1 x daily - 7 x weekly - 10 reps - Supine Hip Adductor Stretch  - 1 x daily - 7 x weekly - 3 reps - 20 sec hold  GOALS: Goals reviewed with patient? Yes  SHORT TERM GOALS: Target date: 01/31/2023    Patient will demo improved safety awareness with performing sit to stand transfers to reduce fall risk Baseline: Pt currently approaches sitting sideways from walker and leaves walker to the side (eval) Goal status: INITIAL  2.  Patient will demo 30-50% compliance with HEP for self management of his symtpoms. Baseline: TBD during 2nd session. Goal status: INITIAL  3.  LONG TERM GOALS: Target date: 03/14/2023    Patient will demo TUG score of <16 sec with RW to improve functional mobility and reduce fall risk  Baseline: 22.75 sec  (01/03/23) Goal status: INITIAL  2.  Patient will demo BBS of >50/56 to improve standing balance and reduce fall risk. Baseline: 44/56 (01/03/23) Goal status: INITIAL  3.  Patient will demo gait speed of >0.5 m/s to improve functional mobility and reduce fall risk with RW Baseline: 0.38 m/s with RW (01/03/23) Goal status: INITIAL  4.  Patient will demo score of <15 sec with 5 x sit to stand without UE support to improve functional strength of bil LE. Baseline: 19 sec no UE support Goal status: INITIAL   ASSESSMENT:  CLINICAL IMPRESSION: Today's skilled session focus on lower back pain relaxation with active exercises first. Then we focused on working on sit to stand to reduce UE reliance and focusing on using LE and core and proper body mechanics to improve control. Then we worked on improving reaction strategy with balance with manual multi directional perturbations.   OBJECTIVE IMPAIRMENTS: decreased balance, decreased mobility, difficulty walking, decreased strength, increased muscle spasms, and impaired UE functional use.   ACTIVITY LIMITATIONS: lifting, bending, standing, squatting, and stairs  PARTICIPATION LIMITATIONS: cleaning, laundry, shopping, community activity, and yard work  PERSONAL FACTORS: Age and Time since onset of injury/illness/exacerbation are also affecting patient's functional outcome.   REHAB POTENTIAL: Good  CLINICAL DECISION MAKING: Stable/uncomplicated  EVALUATION COMPLEXITY: Low  PLAN:  PT FREQUENCY: 2x/week  PT DURATION: 6 weeks  PLANNED INTERVENTIONS: Therapeutic exercises, Therapeutic activity, Neuromuscular re-education, Balance training, Gait training, Patient/Family education, Self Care, Joint mobilization, Stair training, Orthotic/Fit training, Aquatic Therapy, Spinal mobilization, Cryotherapy, Moist heat, Traction, Manual therapy, and Re-evaluation  PLAN  FOR NEXT SESSION:  Check on OT order  Did patient call PCP to discuss Gabapentin and  neurologist referral? Issue HEP Work on safety awareness with sit to stand, chair to chair transfer with proper RW placement and hand placement. Excessive knee flexion bil: any passive restrictions in knees? Or functional weakness?    Ileana Ladd, PT 01/15/2023, 11:00 AM

## 2023-01-17 ENCOUNTER — Ambulatory Visit: Payer: 59

## 2023-01-17 ENCOUNTER — Telehealth: Payer: Self-pay

## 2023-01-17 NOTE — Telephone Encounter (Signed)
Patient Name: MAXSIM BANGERTER MRN: [id number] DOB:1961-01-08, 62 y.o., male Today's Date: 01/17/2023  Called patient at 10am and left VM for patient reminding him that he missed his PT appt at 9:30am. Pt reminded of 3 cxl/NS policy. Pt reminded of his next PT appt as well.   Ileana Ladd, PT 01/17/2023, 10:01 AM

## 2023-01-21 ENCOUNTER — Ambulatory Visit: Payer: 59

## 2023-01-24 ENCOUNTER — Ambulatory Visit: Payer: 59

## 2023-01-24 ENCOUNTER — Telehealth: Payer: Self-pay

## 2023-01-24 DIAGNOSIS — R2689 Other abnormalities of gait and mobility: Secondary | ICD-10-CM

## 2023-01-24 DIAGNOSIS — M6281 Muscle weakness (generalized): Secondary | ICD-10-CM | POA: Diagnosis not present

## 2023-01-24 NOTE — Therapy (Signed)
OUTPATIENT PHYSICAL THERAPY NEURO TREATMENT   Patient Name: Brian [name] MRN: [id number] DOB:May 29, 1960, 62 y.o., male Today's Date: 01/24/2023   PCP: Gwinda Passe, NP REFERRING PROVIDER: Marcine Matar, MD  END OF SESSION:  PT End of Session - 01/24/23 0958     Visit Number 4    Number of Visits 13    Date for PT Re-Evaluation 03/14/23    Authorization Type Medicaid [employer]    PT Start Time 1010    PT Stop Time 1055    PT Time Calculation (min) 45 min    Equipment Utilized During Treatment Gait belt    Activity Tolerance Patient tolerated treatment well    Behavior During Therapy WFL for tasks assessed/performed             History reviewed. No pertinent past medical history. Past Surgical History:  Procedure Laterality Date   ANTERIOR CERVICAL DECOMPRESSION/DISCECTOMY FUSION 4 LEVELS N/A 06/29/2021   Procedure: Anterior Cervical Discectomy Fusion - Cervical Three-Cervical Four - Cervical Four- Cervical Five - Cervical Five- Cervical Six - Cervical Six- Cervical Seven;  Surgeon: Julio Sicks, MD;  Location: [facility] OR;  Service: Neurosurgery;  Laterality: N/A;   HEMATOMA EVACUATION N/A 07/03/2021   Procedure: EXPLORATION OF NECK AND REMOVE BLOOD CLOT;  Surgeon: Julio Sicks, MD;  Location: [facility] OR;  Service: Neurosurgery;  Laterality: N/A;   IR GASTROSTOMY TUBE MOD SED  07/30/2021   IR REPLC GASTRO/COLONIC TUBE PERCUT W/FLUORO  08/17/2021   Patient Active Problem List   Diagnosis Date Noted   Aspiration pneumonia (HCC)    Normocytic anemia 09/04/2021   Fall during current hospitalization 08/30/2021   Lumbar back pain 08/27/2021   Urinary retention 07/26/2021   Abdominal distention    Acute respiratory failure with hypoxia (HCC) 07/15/2021   Malnutrition of moderate degree 07/15/2021   Acute pulmonary embolism (HCC) 07/14/2021   Sinus tachycardia 07/13/2021   Tobacco use disorder 07/11/2021   Physical deconditioning 07/11/2021   Protein-calorie malnutrition,  severe (HCC) 07/05/2021   Cervical myelopathy (HCC); severe cervical spinal stenosis with cord compression s/p ACDF C3-7 on 2/3, complicated by epidural hematoma s/p evacuation 2/7 06/29/2021    ONSET DATE: 12/20/2022  REFERRING DIAG: G82.50 (ICD-10-CM) - Quadriparesis (HCC)  THERAPY DIAG:  Muscle weakness (generalized)  Other abnormalities of gait and mobility  Balance disorder  Rationale for Evaluation and Treatment: Rehabilitation  SUBJECTIVE:                                                                                                                                                                                             SUBJECTIVE STATEMENT:  Patient reports 3 additional falls in the bathroom since last visit. He'll standing up from the shower chair, B LE "get the shakes" and he falls. He tries to catch himself, but has injured his back.  Pt accompanied by: self  PERTINENT HISTORY: Patient with history of tobacco dependence, cervical myelopathy/spinal stenosis with cord compression status post surgery C3-7 on 06/2021, PE 06/2021, anemia, poor nutrition, EtOH use disorder  PAIN:  Are you having pain? Yes: NPRS scale: 7/10 Pain location: back  PRECAUTIONS: C3-7 ACDF, falls  PATIENT GOALS: improve balance  OBJECTIVE:   DIAGNOSTIC FINDINGS: 09/2022 MRI of lumbar spine IMPRESSION: No evidence of lumbar spine fracture.   Unchanged multilevel degenerative disc disease, moderate-severe at L3-L4 and L5-S1.   Trace degenerative retrolisthesis at L3-L4 and anterolisthesis at L4-L5.   Mild to moderate lower lumbar predominant facet arthropathy.  07/03/21 C spine MRI: IMPRESSION: 1. Postoperative changes of recent C3-C7 ACDF. Ventral epidural hemorrhage/fluid extending from C2 inferiorly to C7 and extending into the postoperative disc spaces at these levels. Resulting severe canal stenosis at these levels with compression of the cord and possible cord edema, detailed  above. 2. Superimposed multilevel degenerative change with severe multilevel foraminal stenosis. 3. Prevertebral soft tissue swelling better characterized on recent CTA.   TODAY'S TREATMENT:                                                                                                                              -standing at counter reaching outside BOS transferring cones   -standing on [employer]  -blaze pods (4 on ground, 2 on mirror), random taps 3x1 min  -12 hits, 17 hits, 20 hits -x15ft no AD + MinA   -spastic gait with intermittent B LE tremors noted -scifit x8 mins B LE only   PATIENT EDUCATION: Education details: PT POC, HEP, ask MD re: pain rx, OT referral  Person educated: Patient Education method: Explanation Education comprehension: verbalized understanding  HOME EXERCISE PROGRAM: Access Code: [email or web address] URL: [email or web address] Date: 01/08/2023 Prepared by: Lavone Nian  Exercises - Long Sitting Quad Set  - 1 x daily - 7 x weekly - 10 reps - 5 sec hold - Supine Knee Extension Strengthening  - 1 x daily - 7 x weekly - 2 sets - 10 reps - Hooklying Heel Slide  - 1 x daily - 7 x weekly - 10 reps - Supine Hip Adductor Stretch  - 1 x daily - 7 x weekly - 3 reps - 20 sec hold  GOALS: Goals reviewed with patient? Yes  SHORT TERM GOALS: Target date: 01/31/2023    Patient will demo improved safety awareness with performing sit to stand transfers to reduce fall risk Baseline: Pt currently approaches sitting sideways from walker and leaves walker to the side (eval) Goal status: INITIAL  2.  Patient will demo 30-50% compliance with HEP for self management of his symtpoms. Baseline: TBD during 2nd session. Goal status: INITIAL  3.  LONG  TERM GOALS: Target date: 03/14/2023    Patient will demo TUG score of <16 sec with RW to improve functional mobility and reduce fall risk  Baseline: 22.75 sec (01/03/23) Goal status: INITIAL  2.  Patient will demo BBS  of >50/56 to improve standing balance and reduce fall risk. Baseline: 44/56 (01/03/23) Goal status: INITIAL  3.  Patient will demo gait speed of >0.5 m/s to improve functional mobility and reduce fall risk with RW Baseline: 0.38 m/s with RW (01/03/23) Goal status: INITIAL  4.  Patient will demo score of <15 sec with 5 x sit to stand without UE support to improve functional strength of bil LE. Baseline: 19 sec no UE support Goal status: INITIAL   ASSESSMENT:  CLINICAL IMPRESSION: Patient seen for skilled PT session with emphasis on gross NMR. He is limited by LE pain, R >L with onset of tremors impacting his overall safety with mobility. While he can ambulate without an AD, he is safest to use it AAT- patient verbalized understanding. Continue POC.   OBJECTIVE IMPAIRMENTS: decreased balance, decreased mobility, difficulty walking, decreased strength, increased muscle spasms, and impaired UE functional use.   ACTIVITY LIMITATIONS: lifting, bending, standing, squatting, and stairs  PARTICIPATION LIMITATIONS: cleaning, laundry, shopping, community activity, and yard work  PERSONAL FACTORS: Age and Time since onset of injury/illness/exacerbation are also affecting patient's functional outcome.   REHAB POTENTIAL: Good  CLINICAL DECISION MAKING: Stable/uncomplicated  EVALUATION COMPLEXITY: Low  PLAN:  PT FREQUENCY: 2x/week  PT DURATION: 6 weeks  PLANNED INTERVENTIONS: Therapeutic exercises, Therapeutic activity, Neuromuscular re-education, Balance training, Gait training, Patient/Family education, Self Care, Joint mobilization, Stair training, Orthotic/Fit training, Aquatic Therapy, Spinal mobilization, Cryotherapy, Moist heat, Traction, Manual therapy, and Re-evaluation  PLAN FOR NEXT SESSION:  Progress HEP, standing balance/endurance Work on safety awareness with sit to stand, chair to chair transfer with proper RW placement and hand placement.   Westley Foots, PT Westley Foots, PT, DPT, CBIS  01/24/2023, 11:00 AM

## 2023-01-24 NOTE — Telephone Encounter (Signed)
Brian Passe, NP,   Brian [name] is being seen by PT .  The patient would benefit from an OT evaluation for UE weakness/coordination.    If you agree, please place an order in [facility] workque in [facility] or fax the order to [phone].  Thank you, Westley Foots, PT, DPT, [facility] [address] [place], [place]  [address] Phone:  [phone] Fax:  [phone]

## 2023-01-25 ENCOUNTER — Other Ambulatory Visit (INDEPENDENT_AMBULATORY_CARE_PROVIDER_SITE_OTHER): Payer: Self-pay | Admitting: Primary Care

## 2023-01-25 DIAGNOSIS — R29898 Other symptoms and signs involving the musculoskeletal system: Secondary | ICD-10-CM

## 2023-01-28 ENCOUNTER — Ambulatory Visit: Payer: 59 | Attending: Internal Medicine

## 2023-01-28 NOTE — Therapy (Deleted)
OUTPATIENT PHYSICAL THERAPY NEURO TREATMENT   Patient Name: Brian [name] MRN: [id number] DOB:Sep 11, 1960, 62 y.o., male Today's Date: 01/28/2023   PCP: Gwinda Passe, NP REFERRING PROVIDER: Marcine Matar, MD  END OF SESSION:    No past medical history on file. Past Surgical History:  Procedure Laterality Date   ANTERIOR CERVICAL DECOMPRESSION/DISCECTOMY FUSION 4 LEVELS N/A 06/29/2021   Procedure: Anterior Cervical Discectomy Fusion - Cervical Three-Cervical Four - Cervical Four- Cervical Five - Cervical Five- Cervical Six - Cervical Six- Cervical Seven;  Surgeon: Julio Sicks, MD;  Location: [facility] OR;  Service: Neurosurgery;  Laterality: N/A;   HEMATOMA EVACUATION N/A 07/03/2021   Procedure: EXPLORATION OF NECK AND REMOVE BLOOD CLOT;  Surgeon: Julio Sicks, MD;  Location: [facility] OR;  Service: Neurosurgery;  Laterality: N/A;   IR GASTROSTOMY TUBE MOD SED  07/30/2021   IR REPLC GASTRO/COLONIC TUBE PERCUT W/FLUORO  08/17/2021   Patient Active Problem List   Diagnosis Date Noted   Aspiration pneumonia (HCC)    Normocytic anemia 09/04/2021   Fall during current hospitalization 08/30/2021   Lumbar back pain 08/27/2021   Urinary retention 07/26/2021   Abdominal distention    Acute respiratory failure with hypoxia (HCC) 07/15/2021   Malnutrition of moderate degree 07/15/2021   Acute pulmonary embolism (HCC) 07/14/2021   Sinus tachycardia 07/13/2021   Tobacco use disorder 07/11/2021   Physical deconditioning 07/11/2021   Protein-calorie malnutrition, severe (HCC) 07/05/2021   Cervical myelopathy (HCC); severe cervical spinal stenosis with cord compression s/p ACDF C3-7 on 2/3, complicated by epidural hematoma s/p evacuation 2/7 06/29/2021    ONSET DATE: 12/20/2022  REFERRING DIAG: G82.50 (ICD-10-CM) - Quadriparesis (HCC)  THERAPY DIAG:  No diagnosis found.  Rationale for Evaluation and Treatment: Rehabilitation  SUBJECTIVE:                                                                                                                                                                                              SUBJECTIVE STATEMENT: Patient reports 3 additional falls in the bathroom since last visit. He'll standing up from the shower chair, B LE "get the shakes" and he falls. He tries to catch himself, but has injured his back.  Pt accompanied by: self  PERTINENT HISTORY: Patient with history of tobacco dependence, cervical myelopathy/spinal stenosis with cord compression status post surgery C3-7 on 06/2021, PE 06/2021, anemia, poor nutrition, EtOH use disorder  PAIN:  Are you having pain? Yes: NPRS scale: 7/10 Pain location: back  PRECAUTIONS: C3-7 ACDF, falls  PATIENT GOALS: improve balance  OBJECTIVE:   DIAGNOSTIC FINDINGS: 09/2022 MRI of lumbar spine IMPRESSION: No evidence of  lumbar spine fracture.   Unchanged multilevel degenerative disc disease, moderate-severe at L3-L4 and L5-S1.   Trace degenerative retrolisthesis at L3-L4 and anterolisthesis at L4-L5.   Mild to moderate lower lumbar predominant facet arthropathy.  07/03/21 C spine MRI: IMPRESSION: 1. Postoperative changes of recent C3-C7 ACDF. Ventral epidural hemorrhage/fluid extending from C2 inferiorly to C7 and extending into the postoperative disc spaces at these levels. Resulting severe canal stenosis at these levels with compression of the cord and possible cord edema, detailed above. 2. Superimposed multilevel degenerative change with severe multilevel foraminal stenosis. 3. Prevertebral soft tissue swelling better characterized on recent CTA.   TODAY'S TREATMENT:                                                                                                                              -standing at counter reaching outside BOS transferring cones   -standing on [employer]  -blaze pods (4 on ground, 2 on mirror), random taps 3x1 min  -12 hits, 17 hits, 20 hits -x19ft no AD + MinA    -spastic gait with intermittent B LE tremors noted -scifit x8 mins B LE only   PATIENT EDUCATION: Education details: PT POC, HEP, ask MD re: pain rx, OT referral  Person educated: Patient Education method: Explanation Education comprehension: verbalized understanding  HOME EXERCISE PROGRAM: Access Code: [email or web address] URL: [email or web address] Date: 01/08/2023 Prepared by: Lavone Nian  Exercises - Long Sitting Quad Set  - 1 x daily - 7 x weekly - 10 reps - 5 sec hold - Supine Knee Extension Strengthening  - 1 x daily - 7 x weekly - 2 sets - 10 reps - Hooklying Heel Slide  - 1 x daily - 7 x weekly - 10 reps - Supine Hip Adductor Stretch  - 1 x daily - 7 x weekly - 3 reps - 20 sec hold  GOALS: Goals reviewed with patient? Yes  SHORT TERM GOALS: Target date: 01/31/2023    Patient will demo improved safety awareness with performing sit to stand transfers to reduce fall risk Baseline: Pt currently approaches sitting sideways from walker and leaves walker to the side (eval) Goal status: INITIAL  2.  Patient will demo 30-50% compliance with HEP for self management of his symtpoms. Baseline: TBD during 2nd session. Goal status: INITIAL  3.  LONG TERM GOALS: Target date: 03/14/2023    Patient will demo TUG score of <16 sec with RW to improve functional mobility and reduce fall risk  Baseline: 22.75 sec (01/03/23) Goal status: INITIAL  2.  Patient will demo BBS of >50/56 to improve standing balance and reduce fall risk. Baseline: 44/56 (01/03/23) Goal status: INITIAL  3.  Patient will demo gait speed of >0.5 m/s to improve functional mobility and reduce fall risk with RW Baseline: 0.38 m/s with RW (01/03/23) Goal status: INITIAL  4.  Patient will demo score of <15 sec with 5 x sit to stand without UE support to improve functional  strength of bil LE. Baseline: 19 sec no UE support Goal status: INITIAL   ASSESSMENT:  CLINICAL IMPRESSION: Patient seen for skilled  PT session with emphasis on gross NMR. He is limited by LE pain, R >L with onset of tremors impacting his overall safety with mobility. While he can ambulate without an AD, he is safest to use it AAT- patient verbalized understanding. Continue POC.   OBJECTIVE IMPAIRMENTS: decreased balance, decreased mobility, difficulty walking, decreased strength, increased muscle spasms, and impaired UE functional use.   ACTIVITY LIMITATIONS: lifting, bending, standing, squatting, and stairs  PARTICIPATION LIMITATIONS: cleaning, laundry, shopping, community activity, and yard work  PERSONAL FACTORS: Age and Time since onset of injury/illness/exacerbation are also affecting patient's functional outcome.   REHAB POTENTIAL: Good  CLINICAL DECISION MAKING: Stable/uncomplicated  EVALUATION COMPLEXITY: Low  PLAN:  PT FREQUENCY: 2x/week  PT DURATION: 6 weeks  PLANNED INTERVENTIONS: Therapeutic exercises, Therapeutic activity, Neuromuscular re-education, Balance training, Gait training, Patient/Family education, Self Care, Joint mobilization, Stair training, Orthotic/Fit training, Aquatic Therapy, Spinal mobilization, Cryotherapy, Moist heat, Traction, Manual therapy, and Re-evaluation  PLAN FOR NEXT SESSION:  Progress HEP, standing balance/endurance Work on safety awareness with sit to stand, chair to chair transfer with proper RW placement and hand placement.   Ileana Ladd, PT Westley Foots, PT, DPT, CBIS  01/28/2023, 8:58 AM

## 2023-01-30 ENCOUNTER — Ambulatory Visit: Payer: 59

## 2023-01-30 NOTE — Therapy (Unsigned)
[facility] [address] [place], [place], [address] Phone: [phone]   Fax:  [phone]  Patient Details  Name: Brian [name] MRN: [id number] Date of Birth: [birth year] Referring Provider:  No ref. provider found  Encounter Date: 01/30/2023  PHYSICAL THERAPY DISCHARGE SUMMARY  Visits from Start of Care: 4  Current functional level related to goals / functional outcomes: Remains high falls risk   Remaining deficits: Remains high fall risk   Education / Equipment: PT POC, HEP, use of AD AAT   Patient agrees to discharge. Patient goals were  unable to be assessed as patient did not show for last appt  . Patient is being discharged due to not returning since the last visit.  Westley Foots, PT Westley Foots, PT, DPT, CBIS  01/30/2023, 10:02 AM   [facility] [address] [place], [place], [address] Phone: [phone]   Fax:  [phone]

## 2023-01-31 ENCOUNTER — Other Ambulatory Visit (INDEPENDENT_AMBULATORY_CARE_PROVIDER_SITE_OTHER): Payer: Self-pay | Admitting: Primary Care

## 2023-01-31 DIAGNOSIS — R5381 Other malaise: Secondary | ICD-10-CM

## 2023-02-03 ENCOUNTER — Ambulatory Visit (INDEPENDENT_AMBULATORY_CARE_PROVIDER_SITE_OTHER): Payer: 59 | Admitting: Primary Care

## 2023-02-06 ENCOUNTER — Encounter (INDEPENDENT_AMBULATORY_CARE_PROVIDER_SITE_OTHER): Payer: Self-pay | Admitting: Primary Care

## 2023-02-06 ENCOUNTER — Encounter (INDEPENDENT_AMBULATORY_CARE_PROVIDER_SITE_OTHER): Payer: 59 | Admitting: Primary Care

## 2023-02-17 NOTE — Progress Notes (Signed)
Patient left without being seen.

## 2023-02-20 ENCOUNTER — Ambulatory Visit (INDEPENDENT_AMBULATORY_CARE_PROVIDER_SITE_OTHER): Payer: 59 | Admitting: Primary Care

## 2023-02-20 ENCOUNTER — Encounter (INDEPENDENT_AMBULATORY_CARE_PROVIDER_SITE_OTHER): Payer: Self-pay | Admitting: Primary Care

## 2023-02-20 VITALS — BP 135/91 | HR 89 | Resp 16 | Wt 134.0 lb

## 2023-02-20 DIAGNOSIS — Z1211 Encounter for screening for malignant neoplasm of colon: Secondary | ICD-10-CM

## 2023-02-20 DIAGNOSIS — I10 Essential (primary) hypertension: Secondary | ICD-10-CM

## 2023-02-20 MED ORDER — AMLODIPINE BESYLATE 5 MG PO TABS: 5.0000 mg | ORAL_TABLET | Freq: Every day | ORAL | 0 refills | Status: DC

## 2023-02-20 NOTE — Progress Notes (Signed)
[facility]  Sargon Maggs, is a 62 y.o. male  EXB:[id number]  NUU:[id number]  DOB - 02-02-1961  Chief Complaint  Patient presents with   Hypertension       Subjective:   Brian [name] is a 62 y.o. male here today for a follow up visit for HTN. Patient has No headache, No chest pain, No abdominal pain - No Nausea, No new weakness tingling or numbness, No Cough - shortness of breath  No problems updated.  No Known Allergies  History reviewed. No pertinent past medical history.  Current Outpatient Medications on File Prior to Visit  Medication Sig Dispense Refill   apixaban (ELIQUIS) 5 MG TABS tablet take 1 tablet by mouth twice daily 60 tablet 1   baclofen (LIORESAL) 10 MG tablet Take 2.5 tablets (25 mg total) by mouth 3 (three) times daily. (Patient not taking: Reported on 12/20/2022) 45 tablet 0   gabapentin (NEURONTIN) 300 MG capsule Take 1 capsule (300 mg total) by mouth 3 (three) times daily. 90 capsule 0   No current facility-administered medications on file prior to visit.    Objective:   Vitals:   02/20/23 1526 02/20/23 1527  BP: (!) 143/87 (!) 135/91  Pulse: 89   Resp: 16   SpO2: 100%   Weight: 134 lb (60.8 kg)     Comprehensive ROS Pertinent positive and negative noted in HPI   Exam General appearance : Awake, alert, not in any distress. Speech Clear. Not toxic looking HEENT: Atraumatic and Normocephalic, pupils equally reactive to light and accomodation Neck: Supple, no JVD. No cervical lymphadenopathy.  Chest: Good air entry bilaterally, no added sounds  CVS: S1 S2 regular, no murmurs.  Abdomen: Bowel sounds present, Non tender and not distended with no gaurding, rigidity or rebound. Extremities: B/L Lower Ext shows no edema, both legs are warm to touch Neurology: Awake alert, and oriented X 3, CN II-XII intact, Non focal Skin: No Rash  Data Review Lab Results  Component Value Date   HGBA1C 5.2 07/05/2021    Assessment & Plan    Jaivon was seen today for hypertension.  Diagnoses and all orders for this visit:  Essential hypertension BP goal - < 140/90 Reviewing remains slightly or elevated will start low dose of CCB Explained that having normal blood pressure is the goal and medications are helping to get to goal and maintain normal blood pressure. DIET: Limit salt intake, read nutrition labels to check salt content, limit fried and high fatty foods  Avoid using multisymptom OTC cold preparations that generally contain sudafed which can rise BP. Consult with pharmacist on best cold relief products to use for persons with HTN EXERCISE Discussed incorporating exercise such as walking - 30 minutes most days of the week and can do in 10 minute intervals     Colon cancer screening -     Cologuard  Other orders -     amLODipine (NORVASC) 5 MG tablet; Take 1 tablet (5 mg total) by mouth daily.    Patient have been counseled extensively about nutrition and exercise. Other issues discussed during this visit include: low cholesterol diet, weight control and daily exercise, foot care, annual eye examinations at Ophthalmology, importance of adherence with medications and regular follow-up. We also discussed long term complications of uncontrolled diabetes and hypertension.   Return in about 3 months (around 05/22/2023) for medical conditions.  The patient was given clear instructions to go to ER or return to medical center if symptoms don't improve, worsen  or new problems develop. The patient verbalized understanding. The patient was told to call to get lab results if they haven't heard anything in the next week.   This note has been created with Education officer, environmental. Any transcriptional errors are unintentional.   Grayce Sessions, NP 02/22/2023, 9:19 AM

## 2023-03-04 ENCOUNTER — Other Ambulatory Visit: Payer: Self-pay

## 2023-03-10 ENCOUNTER — Encounter: Payer: 59 | Admitting: Physical Medicine and Rehabilitation

## 2023-03-20 ENCOUNTER — Ambulatory Visit (INDEPENDENT_AMBULATORY_CARE_PROVIDER_SITE_OTHER): Payer: 59

## 2023-03-20 ENCOUNTER — Other Ambulatory Visit (INDEPENDENT_AMBULATORY_CARE_PROVIDER_SITE_OTHER): Payer: Self-pay | Admitting: Primary Care

## 2023-03-20 VITALS — BP 125/86

## 2023-03-20 DIAGNOSIS — I10 Essential (primary) hypertension: Secondary | ICD-10-CM | POA: Diagnosis not present

## 2023-03-20 DIAGNOSIS — Z13220 Encounter for screening for lipoid disorders: Secondary | ICD-10-CM

## 2023-03-20 MED ORDER — AMLODIPINE BESYLATE 5 MG PO TABS: 5.0000 mg | ORAL_TABLET | Freq: Every day | ORAL | 0 refills | Status: DC

## 2023-03-20 NOTE — Progress Notes (Signed)
Blood Pressure Recheck Visit  Name: Brian [name] MRN: [id number] Date of Birth: 07-03-1960  Brian [name] presents today for Blood Pressure recheck with clinical support staff.    BP Readings from Last 3 Encounters:  03/20/23 125/86  02/20/23 (!) 135/91  02/06/23 (!) 136/91    Current Outpatient Medications  Medication Sig Dispense Refill   amLODipine (NORVASC) 5 MG tablet Take 1 tablet (5 mg total) by mouth daily. 90 tablet 0   apixaban (ELIQUIS) 5 MG TABS tablet take 1 tablet by mouth twice daily 60 tablet 1   baclofen (LIORESAL) 10 MG tablet Take 2.5 tablets (25 mg total) by mouth 3 (three) times daily. (Patient not taking: Reported on 12/20/2022) 45 tablet 0   gabapentin (NEURONTIN) 300 MG capsule Take 1 capsule (300 mg total) by mouth 3 (three) times daily. 90 capsule 0   No current facility-administered medications for this visit.    Hypertensive Medication Review: Patient states that they are not taking all their hypertensive medications as prescribed and their last dose of hypertensive medications was  not able to pick up bp medication due to transportation    Documentation of any medication adherence discrepancies: none  Provider Recommendation:  Spoke to patient  and they stated: had transportation   Patient has been scheduled to follow up with PCP keep scheduled appt. Taking bp meds with hx.   Patient has been given provider's recommendations and does not have any questions or concerns at this time. Patient will contact the office for any future questions or concerns.

## 2023-03-21 LAB — CBC WITH DIFFERENTIAL/PLATELET
Basophils Absolute: 0 10*3/uL (ref 0.0–0.2)
Basos: 1 %
EOS (ABSOLUTE): 0.3 10*3/uL (ref 0.0–0.4)
Eos: 5 %
Hematocrit: 43.2 % (ref 37.5–51.0)
Hemoglobin: 14.6 g/dL (ref 13.0–17.7)
Immature Grans (Abs): 0 10*3/uL (ref 0.0–0.1)
Immature Granulocytes: 0 %
Lymphocytes Absolute: 3.4 10*3/uL — ABNORMAL HIGH (ref 0.7–3.1)
Lymphs: 51 %
MCH: 33.9 pg — ABNORMAL HIGH (ref 26.6–33.0)
MCHC: 33.8 g/dL (ref 31.5–35.7)
MCV: 100 fL — ABNORMAL HIGH (ref 79–97)
Monocytes Absolute: 0.9 10*3/uL (ref 0.1–0.9)
Monocytes: 13 %
Neutrophils Absolute: 2 10*3/uL (ref 1.4–7.0)
Neutrophils: 30 %
Platelets: 228 10*3/uL (ref 150–450)
RBC: 4.31 x10E6/uL (ref 4.14–5.80)
RDW: 14.6 % (ref 11.6–15.4)
WBC: 6.6 10*3/uL (ref 3.4–10.8)

## 2023-03-21 LAB — CMP14+EGFR
ALT: 19 [IU]/L (ref 0–44)
AST: 36 [IU]/L (ref 0–40)
Albumin: 4.4 g/dL (ref 3.9–4.9)
Alkaline Phosphatase: 89 [IU]/L (ref 44–121)
BUN/Creatinine Ratio: 15 (ref 10–24)
BUN: 17 mg/dL (ref 8–27)
Bilirubin Total: 1.1 mg/dL (ref 0.0–1.2)
CO2: 23 mmol/L (ref 20–29)
Calcium: 9.8 mg/dL (ref 8.6–10.2)
Chloride: 99 mmol/L (ref 96–106)
Creatinine, Ser: 1.12 mg/dL (ref 0.76–1.27)
Globulin, Total: 3.6 g/dL (ref 1.5–4.5)
Glucose: 87 mg/dL (ref 70–99)
Potassium: 4.6 mmol/L (ref 3.5–5.2)
Sodium: 139 mmol/L (ref 134–144)
Total Protein: 8 g/dL (ref 6.0–8.5)
eGFR: 75 mL/min/{1.73_m2} (ref >59)

## 2023-03-21 LAB — LIPID PANEL
Chol/HDL Ratio: 3.3 ratio (ref 0.0–5.0)
Cholesterol, Total: 172 mg/dL (ref 100–199)
HDL: 52 mg/dL (ref >39)
LDL Chol Calc (NIH): 100 mg/dL — ABNORMAL HIGH (ref 0–99)
Triglycerides: 113 mg/dL (ref 0–149)
VLDL Cholesterol Cal: 20 mg/dL (ref 5–40)

## 2023-03-24 ENCOUNTER — Telehealth (INDEPENDENT_AMBULATORY_CARE_PROVIDER_SITE_OTHER): Payer: Self-pay | Admitting: Primary Care

## 2023-03-24 ENCOUNTER — Other Ambulatory Visit (INDEPENDENT_AMBULATORY_CARE_PROVIDER_SITE_OTHER): Payer: Self-pay | Admitting: Primary Care

## 2023-03-24 NOTE — Telephone Encounter (Signed)
Noted  

## 2023-03-24 NOTE — Telephone Encounter (Signed)
Pt called saying he called the ins [employer]ari Favre and had it dropped so the Amlodipine should now be covered by medicaid.  [facility] on [address].  CB#  [id number]

## 2023-03-24 NOTE — Telephone Encounter (Signed)
Medication Refill - Medication: amlodipine 5 mg Pt is all out Has the patient contacted their pharmacy? yes (Agent: If yes, when and what did the pharmacy advise?)contact pcp  pt says he was denied refill and told he has 2 insurances , Probation officer and  [place], but he doesn't have Jari Favre now , only [place] (with phone number or street name): [facility] [phone] - [place], [place] - [address] Phone: [phone]  Fax: [phone]   Has the patient been seen for an appointment in the last year OR does the patient have an upcoming appointment? yes  Agent: Please be advised that RX refills may take up to 3 business days. We ask that you follow-up with your pharmacy.

## 2023-03-25 MED ORDER — AMLODIPINE BESYLATE 5 MG PO TABS: 5.0000 mg | ORAL_TABLET | Freq: Every day | ORAL | 0 refills | Status: AC

## 2023-03-25 NOTE — Telephone Encounter (Signed)
Pt mentioned she only has Medicaid, not [employer].  Pharmacy told her she had 2 insurances.  Requested Prescriptions  Pending Prescriptions Disp Refills   amLODipine (NORVASC) 5 MG tablet 90 tablet 0    Sig: Take 1 tablet (5 mg total) by mouth daily.     Cardiovascular: Calcium Channel Blockers 2 Passed - 03/25/2023  8:09 AM      Passed - Last BP in normal range    BP Readings from Last 1 Encounters:  03/20/23 125/86         Passed - Last Heart Rate in normal range    Pulse Readings from Last 1 Encounters:  02/20/23 89         Passed - Valid encounter within last 6 months    Recent Outpatient Visits           1 month ago Essential hypertension   [place] [facility] Grayce Sessions, NP   3 months ago Quadriparesis [facility])   [place] [facility] Marcine Matar, MD   3 months ago Colon cancer screening   [place] [facility] Grayce Sessions, NP   7 months ago Encounter for completion of form with patient   [place] [facility] Grayce Sessions, NP   9 months ago Encounter to establish care    [facility] Grayce Sessions, NP       Future Appointments             In 1 month Randa Evens, Kinnie Scales, NP [facility]   In 2 months [place], Aundra Millet, MD [facility]

## 2023-04-11 ENCOUNTER — Encounter: Payer: Self-pay | Admitting: Physical Medicine and Rehabilitation

## 2023-04-11 ENCOUNTER — Encounter
Payer: Medicaid Other | Attending: Physical Medicine and Rehabilitation | Admitting: Physical Medicine and Rehabilitation

## 2023-04-11 VITALS — BP 125/83 | HR 82 | Ht 70.0 in | Wt 134.2 lb

## 2023-04-11 DIAGNOSIS — G959 Disease of spinal cord, unspecified: Secondary | ICD-10-CM | POA: Diagnosis not present

## 2023-04-11 DIAGNOSIS — M792 Neuralgia and neuritis, unspecified: Secondary | ICD-10-CM | POA: Insufficient documentation

## 2023-04-11 DIAGNOSIS — R252 Cramp and spasm: Secondary | ICD-10-CM | POA: Diagnosis present

## 2023-04-11 DIAGNOSIS — R269 Unspecified abnormalities of gait and mobility: Secondary | ICD-10-CM | POA: Diagnosis present

## 2023-04-11 MED ORDER — DULOXETINE HCL 30 MG PO CPEP: 30.0000 mg | ORAL_CAPSULE | Freq: Every evening | ORAL | 5 refills | Status: AC

## 2023-04-11 MED ORDER — BACLOFEN 10 MG PO TABS
5.0000 mg | ORAL_TABLET | Freq: Three times a day (TID) | ORAL | 0 refills | Status: DC
Start: 2023-04-11 — End: 2023-05-22

## 2023-04-11 NOTE — Patient Instructions (Addendum)
Pt is a 62 yr old R handed  male with quadriparesis- due to Cervical myelopathy-  mild spasticity- and HTN he for evaluation for cervical myelopathy.  - start in 3-4 days after first medicine- Duloxetine 30 mg nightly x 1 week, then 60 mg nightly for nerve pain Can cause nausea in 3-5% of patients- that goes away after 7 days. Can also cause mild constipation and dry mouth, dry eyes- and in very rare cases, urinary retention- stop if symptoms significant and call me if nausea mild for an anti-nausea medicine.   2.  Spasticity- is progressive- up to 2 years- but need to treat to improve pain and stiffness- new medicine will also help spasms/jerking of your legs so should help fall risk.    3. For spasticity- Baclofen 5 mg 3x/day- breakfast lunch and dinner or breakfast dinner and bedtime- if only take 2x/day, will have breakthrough of symptoms- and they will be worse for a few hours.  For the first dose- of the first pil,be with someone-   Do 50 mg3x/day x 2 weeks, and then if needed, can increase to 10 mg 3x/day for tightness and spasms.    4. Baclofen -can cause constipation- and sleepiness- some of my patients will take senna or Dulcolax- can take generic of these over the counter laxatives- mild laxatives.   5. Call me I the next 2-3 weeks, earlier if side effects- to let me know how doing.    6. F/U in 3 months- double appt SCI-    7. SCI support group- meets usually last Thursday of the month, however in November, meeting 11/21- 6-7 pm at [facility]- [address]- in first floor conference room-  not meeting in December, but back to normal schedule in January 2025.

## 2023-04-11 NOTE — Progress Notes (Signed)
Subjective:    Patient ID: Brian [name], male    DOB: [birth year], 62 y.o.   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  No infection of any kind.   Now walking with RW- ~ 100 ft- from front door to here without stopping- but needs RW to walk.    Used to have muscle spasms- but not anymore- actually legs sometimes will jump, couple times per month. That's what caused a fall before. Leg just = out of nowhere, will jump- no warning.    Also very stiff!  Last fall  2 months ago- due to spasms-     Bowel- at least 1x/day- and Bladder- no peeing issues- going well.    Pain- on nothing for pain- rates ~ 4/10-  Hands get numb and cold to feel all the time.   Not on Gabapentin anymore- stopped in September 2024-  helped the nerve pain he had at the time but not pins/needle/tingling. But helped sharp pains. .  Doesn't know why they stopped it.  No side effects from it. Never tried anything else for nerve pain; Pins and needles never went away/tingling-   NKDA  Did H/H and outpt therapy- couldn't get to outpt therapy so changed to H/H- H/H didn't do much  Pain Inventory Average Pain 4 Pain Right Now 4 My pain is sharp  LOCATION OF PAIN  hand and leg  BOWEL Number of stools per week: 4  BLADDER Normal  Mobility use a walker how many minutes can you walk? 10 ability to climb steps?  no do you drive?  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  no  Physicians involved in your care Primary care Jonah Blue MD   No family history on file. Social History   Socioeconomic History   Marital status: Single    Spouse name: Not on file   Number of children: Not on file   Years of education: Not on file   Highest education level: Not on file  Occupational History   Not on file  Tobacco Use   Smoking status: Every Day    Current packs/day: 0.33    Types: Cigarettes   Smokeless tobacco: Never  Vaping Use   Vaping status: Never Used  Substance and Sexual Activity   Alcohol use: Not Currently    Comment: beers on weekends only   Drug use: Yes    Types: Marijuana    Comment: only on weekends   Sexual activity: Not on file  Other Topics Concern   Not on file  Social History Narrative   Not on file   Social Determinants of Health   Financial Resource Strain: Not on file  Food Insecurity: Not on file  Transportation Needs: Not on file  Physical Activity: Not on file  Stress: Not on file  Social Connections: Not on file   Past Surgical History:  Procedure Laterality Date   ANTERIOR  CERVICAL DECOMPRESSION/DISCECTOMY FUSION 4 LEVELS N/A 06/29/2021   Procedure: Anterior Cervical Discectomy Fusion - Cervical Three-Cervical Four - Cervical Four- Cervical Five - Cervical Five- Cervical Six - Cervical Six- Cervical Seven;  Surgeon: Julio Sicks, MD;  Location: [facility] OR;  Service: Neurosurgery;  Laterality: N/A;   HEMATOMA EVACUATION N/A 07/03/2021   Procedure: EXPLORATION OF NECK AND REMOVE BLOOD CLOT;  Surgeon: Julio Sicks, MD;  Location: [facility] OR;  Service: Neurosurgery;  Laterality: N/A;   IR GASTROSTOMY TUBE MOD SED  07/30/2021   IR REPLC GASTRO/COLONIC TUBE PERCUT W/FLUORO  08/17/2021   No past medical history on file. BP 125/83   Pulse 82   Ht 5\' 10"  (1.778 m)   Wt 134 lb 3.2 oz (60.9 kg)   SpO2 97%   BMI 19.26 kg/m   Opioid Risk Score:   Fall Risk Score:  `1  Depression screen [facility] 2/9     04/11/2023    9:41  AM 02/20/2023    3:28 PM 02/06/2023    4:14 PM 12/20/2022    2:09 PM 06/24/2022    2:48 PM  Depression screen PHQ 2/9  Decreased Interest 1 0 0 3 3  Down, Depressed, Hopeless 0 0 0 1 2  PHQ - 2 Score 1 0 0 4 5  Altered sleeping 1 0 0 2 3  Tired, decreased energy 0 0 0 1   Change in appetite 0 0 0 1 2  Feeling bad or failure about yourself  0 0 0 0 1  Trouble concentrating 0 0 0 1 1  Moving slowly or fidgety/restless 1 0 0 1 3  Suicidal thoughts 0 0 0 0 0  PHQ-9 Score 3 0 0 10 15     Review of Systems  Constitutional: Negative.   HENT: Negative.    Eyes: Negative.   Respiratory: Negative.    Cardiovascular: Negative.   Gastrointestinal: Negative.   Endocrine: Negative.   Genitourinary: Negative.   Musculoskeletal:  Positive for gait problem.  Skin: Negative.   Allergic/Immunologic: Negative.   Hematological: Negative.   Psychiatric/Behavioral: Negative.    All other systems reviewed and are negative.      Objective:   Physical Exam  MSK: RUE_ deltoids 4+/5; biceps 4+/5; triceps 4/5; WE 4/5 grip 3+/5 and FA 3+/5 LUE- deltoids and biceps 4+/5; triceps 4+/5 WE 4/5 but slightly more than R; grip 3+/5 and FA 3+/5 RLE- HF 4+/5; KE 4+/5; DF 2/5- limited ROM max 85-90 degrees with significant push; PF 4/5 LLE_ HF 4+/5; KE 4/5; DF 3+/5 and PF 4+/5  Neuro: MAS of 2 in LE's- and 1+ in UE"s- B/L clonus 4-5 beats in LE's and hoffman's RUE only- not lUE.   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  - start in 3-4 days after first medicine- Duloxetine 30 mg nightly x 1 week, then 60 mg nightly for nerve pain Can cause nausea in 3-5% of patients- that goes away after 7 days. Can also cause  mild constipation and dry mouth, dry eyes- and in very rare cases, urinary retention- stop if symptoms significant and call me if nausea mild for an anti-nausea medicine.   2.  Spasticity- is progressive- up to 2 years- but need to treat to improve pain and stiffness- new medicine will also help spasms/jerking of your legs so should help fall risk.    3. For spasticity- Baclofen 5 mg 3x/day- breakfast lunch and dinner or breakfast dinner and bedtime- if only take 2x/day, will have breakthrough of symptoms- and they will be worse for a few hours.  For the first dose- of the first pil,be with someone-   Do 50 mg3x/day x 2 weeks, and then if needed, can increase to 10 mg 3x/day for tightness and spasms.    4. Baclofen -can cause constipation- and sleepiness- some of my patients will take senna or Dulcolax- can take generic of these over the counter laxatives- mild laxatives.   5. Call me I the next 2-3 weeks, earlier if side effects- to let me know how doing.    6. F/U in 3 months- double appt SCI-    7. SCI support group- meets usually last Thursday of the month, however in November, meeting 11/21- 6-7 pm at [facility]- [address]- in first floor conference room-  not meeting in December, but back to normal schedule in January 2025.     I spent a total of 36   minutes on total care today- >50% coordination of care- due to discussion about spasticity and nerve pain- said no neurogenic bowel and bladder-

## 2023-05-22 ENCOUNTER — Encounter (INDEPENDENT_AMBULATORY_CARE_PROVIDER_SITE_OTHER): Payer: Self-pay

## 2023-05-22 ENCOUNTER — Other Ambulatory Visit: Payer: Self-pay | Admitting: Physical Medicine and Rehabilitation

## 2023-05-22 ENCOUNTER — Ambulatory Visit (INDEPENDENT_AMBULATORY_CARE_PROVIDER_SITE_OTHER): Payer: Self-pay | Admitting: Primary Care

## 2023-06-06 ENCOUNTER — Ambulatory Visit: Payer: 59 | Admitting: Physical Medicine and Rehabilitation

## 2023-07-11 ENCOUNTER — Encounter
Payer: Medicaid Other | Attending: Physical Medicine and Rehabilitation | Admitting: Physical Medicine and Rehabilitation

## 2023-07-11 DIAGNOSIS — G959 Disease of spinal cord, unspecified: Secondary | ICD-10-CM | POA: Insufficient documentation

## 2023-07-11 DIAGNOSIS — M792 Neuralgia and neuritis, unspecified: Secondary | ICD-10-CM | POA: Insufficient documentation

## 2023-07-11 DIAGNOSIS — R269 Unspecified abnormalities of gait and mobility: Secondary | ICD-10-CM | POA: Insufficient documentation

## 2023-07-11 DIAGNOSIS — R252 Cramp and spasm: Secondary | ICD-10-CM | POA: Insufficient documentation

## 2023-09-09 ENCOUNTER — Other Ambulatory Visit (INDEPENDENT_AMBULATORY_CARE_PROVIDER_SITE_OTHER): Payer: Self-pay | Admitting: Primary Care

## 2023-09-17 ENCOUNTER — Encounter (INDEPENDENT_AMBULATORY_CARE_PROVIDER_SITE_OTHER): Payer: Self-pay | Admitting: Primary Care

## 2023-09-17 ENCOUNTER — Ambulatory Visit (INDEPENDENT_AMBULATORY_CARE_PROVIDER_SITE_OTHER): Payer: MEDICAID | Admitting: Primary Care

## 2023-09-17 VITALS — BP 119/80 | HR 88 | Resp 16 | Ht 70.0 in | Wt 125.2 lb

## 2023-09-17 DIAGNOSIS — Z2821 Immunization not carried out because of patient refusal: Secondary | ICD-10-CM | POA: Diagnosis not present

## 2023-09-17 DIAGNOSIS — Z13220 Encounter for screening for lipoid disorders: Secondary | ICD-10-CM

## 2023-09-17 DIAGNOSIS — D649 Anemia, unspecified: Secondary | ICD-10-CM

## 2023-09-17 DIAGNOSIS — I10 Essential (primary) hypertension: Secondary | ICD-10-CM

## 2023-09-17 NOTE — Progress Notes (Signed)
 [facility]  Siddhant Hashemi, is a 63 y.o. male  UJW:[id number]  FAO:[id number]  DOB - Jun 27, 1960  Chief Complaint  Patient presents with   Hypertension   Edema    Left foot Started a week ago        Subjective:   Brian [name] is a 63 y.o. male here today for a follow up visit HTN. Patient has No headache, No chest pain, No abdominal pain - No Nausea, No new weakness tingling or numbness, No Cough - shortness of breath. Patient voices concerns about his left foot swelling.  Asked patient to take shoes and socks on both feet to compare.  There is no edema/fluid present explained to patient how and why was able to press my finger in his foot and no intervention present.  However he may purchase compression for increased circulation No problems updated.  Comprehensive ROS Pertinent positive and negative noted in HPI   No Known Allergies  History reviewed. No pertinent past medical history.  Current Outpatient Medications on File Prior to Visit  Medication Sig Dispense Refill   amLODipine  (NORVASC ) 5 MG tablet Take 1 tablet (5 mg total) by mouth daily. (Patient not taking: Reported on 09/17/2023) 90 tablet 0   baclofen  (LIORESAL ) 10 MG tablet Take 1 tablet (10 mg total) by mouth 3 (three) times daily. (Patient not taking: Reported on 09/17/2023) 90 tablet 1   DULoxetine  (CYMBALTA ) 30 MG capsule Take 1 capsule (30 mg total) by mouth at bedtime. X 1 week then 60 mg/2 capsules nightly- for nerve pain- (Patient not taking: Reported on 09/17/2023) 60 capsule 5   gabapentin  (NEURONTIN ) 300 MG capsule Take 1 capsule (300 mg total) by mouth 3 (three) times daily. (Patient not taking: Reported on 09/17/2023) 90 capsule 0   No current facility-administered medications on file prior to visit.   Health Maintenance  Topic Date Due   Colon Cancer Screening  Never done   COVID-19 Vaccine (1 - 2024-25 season) Never done   Zoster (Shingles) Vaccine (1 of 2) 12/17/2023*    Pneumococcal Vaccination (1 of 2 - PCV) 09/16/2024*   Flu Shot  12/26/2023   Hepatitis C Screening  Completed   HIV Screening  Completed   HPV Vaccine  Aged Out   Meningitis B Vaccine  Aged Out   DTaP/Tdap/Td vaccine  Discontinued  *Topic was postponed. The date shown is not the original due date.    Objective:   Vitals:   09/17/23 1409 09/17/23 1410  BP: (!) 152/94 119/80  Pulse: 88   Resp: 16   SpO2: 100%   Weight: 125 lb 3.2 oz (56.8 kg)   Height: 5\' 10"  (1.778 m)    BP Readings from Last 3 Encounters:  09/17/23 119/80  04/11/23 125/83  03/20/23 125/86      Physical Exam Vitals reviewed.  Constitutional:      Appearance: Normal appearance.  HENT:     Head: Normocephalic.     Right Ear: Tympanic membrane and external ear normal.     Left Ear: Tympanic membrane and external ear normal.     Nose: Nose normal.  Eyes:     Extraocular Movements: Extraocular movements intact.     Pupils: Pupils are equal, round, and reactive to light.  Cardiovascular:     Rate and Rhythm: Normal rate and regular rhythm.  Pulmonary:     Effort: Pulmonary effort is normal.     Breath sounds: Normal breath sounds.  Abdominal:     General:  Bowel sounds are normal. There is distension.     Palpations: Abdomen is soft.  Musculoskeletal:        General: Normal range of motion.  Skin:    General: Skin is warm and dry.  Neurological:     Mental Status: He is alert and oriented to person, place, and time.  Psychiatric:        Mood and Affect: Mood normal.        Behavior: Behavior normal.        Thought Content: Thought content normal.        Judgment: Judgment normal.       Assessment & Plan  Brian [name] was seen today for hypertension and edema.  Diagnoses and all orders for this visit:  Pneumococcal vaccination declined  Herpes zoster vaccination declined  Normocytic anemia -     CBC with Differential/Platelet  Essential hypertension Well controlled without medication since  November 2024 DIET: Limit salt intake, read nutrition labels to check salt content, limit fried and high fatty foods  Avoid using multisymptom OTC cold preparations that generally contain sudafed which can rise BP. Consult with pharmacist on best cold relief products to use for persons with HTN EXERCISE Discussed incorporating exercise such as walking - 30 minutes most days of the week and can do in 10 minute intervals    -     CMP14+EGFR  Lipid screening -     Lipid panel   Patient have been counseled extensively about nutrition and exercise. Other issues discussed during this visit include: low cholesterol diet, weight control and daily exercise, foot care, annual eye examinations at Ophthalmology, importance of adherence with medications and regular follow-up. We also discussed long term complications of uncontrolled diabetes and hypertension.   Return in about 6 months (around 03/18/2024) for anemia ck Bp .  The patient was given clear instructions to go to ER or return to medical center if symptoms don't improve, worsen or new problems develop. The patient verbalized understanding. The patient was told to call to get lab results if they haven't heard anything in the next week.   This note has been created with Education officer, environmental. Any transcriptional errors are unintentional.   Marius Siemens, NP 09/17/2023, 2:28 PM

## 2023-09-18 LAB — CMP14+EGFR
ALT: 34 IU/L (ref 0–44)
AST: 46 IU/L — ABNORMAL HIGH (ref 0–40)
Albumin: 4.1 g/dL (ref 3.9–4.9)
Alkaline Phosphatase: 92 IU/L (ref 44–121)
BUN/Creatinine Ratio: 18 (ref 10–24)
BUN: 17 mg/dL (ref 8–27)
Bilirubin Total: 0.9 mg/dL (ref 0.0–1.2)
CO2: 22 mmol/L (ref 20–29)
Calcium: 9.5 mg/dL (ref 8.6–10.2)
Chloride: 105 mmol/L (ref 96–106)
Creatinine, Ser: 0.94 mg/dL (ref 0.76–1.27)
Globulin, Total: 3.5 g/dL (ref 1.5–4.5)
Glucose: 93 mg/dL (ref 70–99)
Potassium: 4.5 mmol/L (ref 3.5–5.2)
Sodium: 140 mmol/L (ref 134–144)
Total Protein: 7.6 g/dL (ref 6.0–8.5)
eGFR: 92 mL/min/{1.73_m2} (ref >59)

## 2023-09-18 LAB — LIPID PANEL
Chol/HDL Ratio: 2.3 ratio (ref 0.0–5.0)
Cholesterol, Total: 170 mg/dL (ref 100–199)
HDL: 75 mg/dL (ref >39)
LDL Chol Calc (NIH): 84 mg/dL (ref 0–99)
Triglycerides: 53 mg/dL (ref 0–149)
VLDL Cholesterol Cal: 11 mg/dL (ref 5–40)

## 2023-09-18 LAB — CBC WITH DIFFERENTIAL/PLATELET
Basophils Absolute: 0 10*3/uL (ref 0.0–0.2)
Basos: 0 %
EOS (ABSOLUTE): 0.2 10*3/uL (ref 0.0–0.4)
Eos: 3 %
Hematocrit: 38.6 % (ref 37.5–51.0)
Hemoglobin: 13.2 g/dL (ref 13.0–17.7)
Immature Grans (Abs): 0 10*3/uL (ref 0.0–0.1)
Immature Granulocytes: 0 %
Lymphocytes Absolute: 3.2 10*3/uL — ABNORMAL HIGH (ref 0.7–3.1)
Lymphs: 60 %
MCH: 34.6 pg — ABNORMAL HIGH (ref 26.6–33.0)
MCHC: 34.2 g/dL (ref 31.5–35.7)
MCV: 101 fL — ABNORMAL HIGH (ref 79–97)
Monocytes Absolute: 0.6 10*3/uL (ref 0.1–0.9)
Monocytes: 12 %
Neutrophils Absolute: 1.4 10*3/uL (ref 1.4–7.0)
Neutrophils: 25 %
Platelets: 162 10*3/uL (ref 150–450)
RBC: 3.81 x10E6/uL — ABNORMAL LOW (ref 4.14–5.80)
RDW: 15 % (ref 11.6–15.4)
WBC: 5.4 10*3/uL (ref 3.4–10.8)

## 2024-03-18 ENCOUNTER — Encounter (INDEPENDENT_AMBULATORY_CARE_PROVIDER_SITE_OTHER): Payer: Self-pay

## 2024-03-18 ENCOUNTER — Ambulatory Visit (INDEPENDENT_AMBULATORY_CARE_PROVIDER_SITE_OTHER): Admitting: Primary Care

## 2024-06-08 ENCOUNTER — Telehealth (INDEPENDENT_AMBULATORY_CARE_PROVIDER_SITE_OTHER): Payer: Self-pay | Admitting: Primary Care

## 2024-06-08 NOTE — Telephone Encounter (Signed)
 Called pt to confirm appt. Pt's phone is unavailable.

## 2024-06-09 ENCOUNTER — Encounter (INDEPENDENT_AMBULATORY_CARE_PROVIDER_SITE_OTHER): Payer: Self-pay | Admitting: Primary Care
